# Patient Record
Sex: Female | Born: 1940 | Race: White | Hispanic: No | Smoking: Former smoker
Health system: Southern US, Community
[De-identification: ages and names within clinical notes are randomized; demographics above are authoritative.]

## PROBLEM LIST (undated history)

## (undated) DIAGNOSIS — J189 Pneumonia, unspecified organism: Secondary | ICD-10-CM

## (undated) DIAGNOSIS — I2510 Atherosclerotic heart disease of native coronary artery without angina pectoris: Secondary | ICD-10-CM

## (undated) DIAGNOSIS — M545 Low back pain, unspecified: Secondary | ICD-10-CM

## (undated) DIAGNOSIS — S069XAA Unspecified intracranial injury with loss of consciousness status unknown, initial encounter: Secondary | ICD-10-CM

## (undated) DIAGNOSIS — B9620 Unspecified Escherichia coli [E. coli] as the cause of diseases classified elsewhere: Secondary | ICD-10-CM

## (undated) DIAGNOSIS — R4189 Other symptoms and signs involving cognitive functions and awareness: Secondary | ICD-10-CM

## (undated) DIAGNOSIS — R32 Unspecified urinary incontinence: Secondary | ICD-10-CM

## (undated) DIAGNOSIS — I70209 Unspecified atherosclerosis of native arteries of extremities, unspecified extremity: Secondary | ICD-10-CM

## (undated) DIAGNOSIS — F411 Generalized anxiety disorder: Secondary | ICD-10-CM

## (undated) DIAGNOSIS — N184 Chronic kidney disease, stage 4 (severe): Secondary | ICD-10-CM

## (undated) DIAGNOSIS — F419 Anxiety disorder, unspecified: Secondary | ICD-10-CM

## (undated) DIAGNOSIS — F329 Major depressive disorder, single episode, unspecified: Secondary | ICD-10-CM

## (undated) DIAGNOSIS — G458 Other transient cerebral ischemic attacks and related syndromes: Secondary | ICD-10-CM

## (undated) DIAGNOSIS — I480 Paroxysmal atrial fibrillation: Secondary | ICD-10-CM

## (undated) DIAGNOSIS — A419 Sepsis, unspecified organism: Secondary | ICD-10-CM

## (undated) DIAGNOSIS — N12 Tubulo-interstitial nephritis, not specified as acute or chronic: Secondary | ICD-10-CM

## (undated) DIAGNOSIS — R7881 Bacteremia: Secondary | ICD-10-CM

## (undated) DIAGNOSIS — I739 Peripheral vascular disease, unspecified: Secondary | ICD-10-CM

## (undated) DIAGNOSIS — H9190 Unspecified hearing loss, unspecified ear: Secondary | ICD-10-CM

## (undated) DIAGNOSIS — I774 Celiac artery compression syndrome: Secondary | ICD-10-CM

## (undated) DIAGNOSIS — I219 Acute myocardial infarction, unspecified: Secondary | ICD-10-CM

## (undated) DIAGNOSIS — R300 Dysuria: Secondary | ICD-10-CM

## (undated) DIAGNOSIS — H409 Unspecified glaucoma: Secondary | ICD-10-CM

## (undated) DIAGNOSIS — G4733 Obstructive sleep apnea (adult) (pediatric): Secondary | ICD-10-CM

## (undated) DIAGNOSIS — G8929 Other chronic pain: Secondary | ICD-10-CM

## (undated) DIAGNOSIS — I10 Essential (primary) hypertension: Secondary | ICD-10-CM

## (undated) DIAGNOSIS — K219 Gastro-esophageal reflux disease without esophagitis: Secondary | ICD-10-CM

## (undated) DIAGNOSIS — M47816 Spondylosis without myelopathy or radiculopathy, lumbar region: Secondary | ICD-10-CM

## (undated) DIAGNOSIS — H8110 Benign paroxysmal vertigo, unspecified ear: Secondary | ICD-10-CM

## (undated) DIAGNOSIS — E039 Hypothyroidism, unspecified: Secondary | ICD-10-CM

## (undated) DIAGNOSIS — I771 Stricture of artery: Secondary | ICD-10-CM

## (undated) DIAGNOSIS — K5790 Diverticulosis of intestine, part unspecified, without perforation or abscess without bleeding: Secondary | ICD-10-CM

## (undated) DIAGNOSIS — F32A Depression, unspecified: Secondary | ICD-10-CM

## (undated) DIAGNOSIS — Z9989 Dependence on other enabling machines and devices: Secondary | ICD-10-CM

## (undated) DIAGNOSIS — G9389 Other specified disorders of brain: Secondary | ICD-10-CM

## (undated) DIAGNOSIS — L509 Urticaria, unspecified: Secondary | ICD-10-CM

## (undated) DIAGNOSIS — J3489 Other specified disorders of nose and nasal sinuses: Secondary | ICD-10-CM

## (undated) DIAGNOSIS — K635 Polyp of colon: Secondary | ICD-10-CM

## (undated) DIAGNOSIS — E782 Mixed hyperlipidemia: Secondary | ICD-10-CM

## (undated) DIAGNOSIS — I509 Heart failure, unspecified: Secondary | ICD-10-CM

## (undated) DIAGNOSIS — S069X9A Unspecified intracranial injury with loss of consciousness of unspecified duration, initial encounter: Secondary | ICD-10-CM

## (undated) HISTORY — DX: Peripheral vascular disease, unspecified: I73.9

## (undated) HISTORY — PX: NM MYOCAR PERF EJECTION FRACTION: HXRAD630

## (undated) HISTORY — DX: Other chronic pain: G89.29

## (undated) HISTORY — DX: Pneumonia, unspecified organism: J18.9

## (undated) HISTORY — DX: Atherosclerotic heart disease of native coronary artery without angina pectoris: I25.10

## (undated) HISTORY — DX: Celiac artery compression syndrome: I77.4

## (undated) HISTORY — DX: Unspecified glaucoma: H40.9

## (undated) HISTORY — PX: DOPPLER ECHOCARDIOGRAPHY: SHX263

## (undated) HISTORY — DX: Acute myocardial infarction, unspecified: I21.9

## (undated) HISTORY — PX: CORONARY ARTERY BYPASS GRAFT: SHX141

## (undated) HISTORY — DX: Gastro-esophageal reflux disease without esophagitis: K21.9

## (undated) HISTORY — DX: Obstructive sleep apnea (adult) (pediatric): G47.33

## (undated) HISTORY — DX: Unspecified intracranial injury with loss of consciousness status unknown, initial encounter: S06.9XAA

## (undated) HISTORY — DX: Obstructive sleep apnea (adult) (pediatric): Z99.89

## (undated) HISTORY — DX: Dysuria: R30.0

## (undated) HISTORY — DX: Low back pain: M54.5

## (undated) HISTORY — DX: Stricture of artery: I77.1

## (undated) HISTORY — DX: Generalized anxiety disorder: F41.1

## (undated) HISTORY — DX: Unspecified atherosclerosis of native arteries of extremities, unspecified extremity: I70.209

## (undated) HISTORY — DX: Major depressive disorder, single episode, unspecified: F32.9

## (undated) HISTORY — DX: Unspecified intracranial injury with loss of consciousness of unspecified duration, initial encounter: S06.9X9A

## (undated) HISTORY — DX: Other symptoms and signs involving cognitive functions and awareness: R41.89

## (undated) HISTORY — DX: Mixed hyperlipidemia: E78.2

## (undated) HISTORY — DX: Diverticulosis of intestine, part unspecified, without perforation or abscess without bleeding: K57.90

## (undated) HISTORY — DX: Unspecified urinary incontinence: R32

## (undated) HISTORY — DX: Sepsis, unspecified organism: A41.9

## (undated) HISTORY — DX: Tubulo-interstitial nephritis, not specified as acute or chronic: N12

## (undated) HISTORY — DX: Urticaria, unspecified: L50.9

## (undated) HISTORY — DX: Unspecified hearing loss, unspecified ear: H91.90

## (undated) HISTORY — DX: Depression, unspecified: F32.A

## (undated) HISTORY — DX: Benign paroxysmal vertigo, unspecified ear: H81.10

## (undated) HISTORY — DX: Hypothyroidism, unspecified: E03.9

## (undated) HISTORY — DX: Spondylosis without myelopathy or radiculopathy, lumbar region: M47.816

## (undated) HISTORY — DX: Low back pain, unspecified: M54.50

## (undated) HISTORY — PX: CORONARY ANGIOPLASTY: SHX604

## (undated) HISTORY — PX: JOINT REPLACEMENT: SHX530

## (undated) HISTORY — DX: Anxiety disorder, unspecified: F41.9

## (undated) HISTORY — DX: Polyp of colon: K63.5

## (undated) HISTORY — DX: Other specified disorders of brain: G93.89

## (undated) HISTORY — DX: Other transient cerebral ischemic attacks and related syndromes: G45.8

---

## 1997-08-08 HISTORY — PX: OTHER SURGICAL HISTORY: SHX169

## 1998-05-18 ENCOUNTER — Encounter: Payer: Self-pay | Admitting: Emergency Medicine

## 1998-05-18 ENCOUNTER — Inpatient Hospital Stay (HOSPITAL_COMMUNITY): Admission: EM | Admit: 1998-05-18 | Discharge: 1998-05-26 | Payer: Self-pay | Admitting: Emergency Medicine

## 1998-05-21 ENCOUNTER — Encounter: Payer: Self-pay | Admitting: Surgery

## 1998-05-22 ENCOUNTER — Encounter: Payer: Self-pay | Admitting: Surgery

## 1998-07-14 ENCOUNTER — Encounter (HOSPITAL_COMMUNITY): Admission: RE | Admit: 1998-07-14 | Discharge: 1998-10-12 | Payer: Self-pay | Admitting: Cardiovascular Disease

## 1998-10-13 ENCOUNTER — Encounter (HOSPITAL_COMMUNITY): Admission: RE | Admit: 1998-10-13 | Discharge: 1999-01-11 | Payer: Self-pay | Admitting: Cardiovascular Disease

## 1998-12-13 ENCOUNTER — Encounter: Payer: Self-pay | Admitting: Cardiovascular Disease

## 1998-12-13 ENCOUNTER — Observation Stay (HOSPITAL_COMMUNITY): Admission: EM | Admit: 1998-12-13 | Discharge: 1998-12-14 | Payer: Self-pay | Admitting: Emergency Medicine

## 1998-12-22 ENCOUNTER — Other Ambulatory Visit: Admission: RE | Admit: 1998-12-22 | Discharge: 1998-12-22 | Payer: Self-pay | Admitting: Gynecology

## 1999-05-12 ENCOUNTER — Inpatient Hospital Stay (HOSPITAL_COMMUNITY): Admission: EM | Admit: 1999-05-12 | Discharge: 1999-05-14 | Payer: Self-pay | Admitting: Emergency Medicine

## 1999-06-18 ENCOUNTER — Encounter: Payer: Self-pay | Admitting: Otolaryngology

## 1999-06-18 ENCOUNTER — Encounter: Admission: RE | Admit: 1999-06-18 | Discharge: 1999-06-18 | Payer: Self-pay | Admitting: Otolaryngology

## 2000-02-08 ENCOUNTER — Other Ambulatory Visit: Admission: RE | Admit: 2000-02-08 | Discharge: 2000-02-08 | Payer: Self-pay | Admitting: Gynecology

## 2000-12-19 ENCOUNTER — Other Ambulatory Visit: Admission: RE | Admit: 2000-12-19 | Discharge: 2000-12-19 | Payer: Self-pay | Admitting: Gynecology

## 2000-12-25 ENCOUNTER — Encounter: Payer: Self-pay | Admitting: Emergency Medicine

## 2000-12-26 ENCOUNTER — Inpatient Hospital Stay (HOSPITAL_COMMUNITY): Admission: EM | Admit: 2000-12-26 | Discharge: 2000-12-28 | Payer: Self-pay | Admitting: Emergency Medicine

## 2000-12-28 ENCOUNTER — Encounter: Payer: Self-pay | Admitting: Cardiovascular Disease

## 2000-12-28 ENCOUNTER — Encounter (INDEPENDENT_AMBULATORY_CARE_PROVIDER_SITE_OTHER): Payer: Self-pay | Admitting: *Deleted

## 2001-02-17 ENCOUNTER — Encounter: Payer: Self-pay | Admitting: Emergency Medicine

## 2001-02-17 ENCOUNTER — Observation Stay (HOSPITAL_COMMUNITY): Admission: EM | Admit: 2001-02-17 | Discharge: 2001-02-20 | Payer: Self-pay | Admitting: Emergency Medicine

## 2001-02-18 ENCOUNTER — Encounter: Payer: Self-pay | Admitting: Cardiovascular Disease

## 2001-03-12 ENCOUNTER — Other Ambulatory Visit: Admission: RE | Admit: 2001-03-12 | Discharge: 2001-03-12 | Payer: Self-pay | Admitting: Gynecology

## 2001-05-10 ENCOUNTER — Ambulatory Visit (HOSPITAL_COMMUNITY): Admission: RE | Admit: 2001-05-10 | Discharge: 2001-05-10 | Payer: Self-pay | Admitting: Gynecology

## 2001-05-10 ENCOUNTER — Encounter (INDEPENDENT_AMBULATORY_CARE_PROVIDER_SITE_OTHER): Payer: Self-pay

## 2002-03-12 ENCOUNTER — Other Ambulatory Visit: Admission: RE | Admit: 2002-03-12 | Discharge: 2002-03-12 | Payer: Self-pay | Admitting: Gynecology

## 2002-03-26 ENCOUNTER — Encounter (INDEPENDENT_AMBULATORY_CARE_PROVIDER_SITE_OTHER): Payer: Self-pay | Admitting: Gastroenterology

## 2002-03-26 ENCOUNTER — Encounter: Payer: Self-pay | Admitting: Internal Medicine

## 2002-04-12 ENCOUNTER — Encounter: Payer: Self-pay | Admitting: Gastroenterology

## 2002-04-12 ENCOUNTER — Ambulatory Visit (HOSPITAL_COMMUNITY): Admission: RE | Admit: 2002-04-12 | Discharge: 2002-04-12 | Payer: Self-pay | Admitting: Gastroenterology

## 2002-04-12 ENCOUNTER — Encounter (INDEPENDENT_AMBULATORY_CARE_PROVIDER_SITE_OTHER): Payer: Self-pay | Admitting: *Deleted

## 2002-06-19 ENCOUNTER — Other Ambulatory Visit: Admission: RE | Admit: 2002-06-19 | Discharge: 2002-06-19 | Payer: Self-pay | Admitting: Gynecology

## 2002-12-31 ENCOUNTER — Other Ambulatory Visit: Admission: RE | Admit: 2002-12-31 | Discharge: 2002-12-31 | Payer: Self-pay | Admitting: Gynecology

## 2003-10-17 ENCOUNTER — Other Ambulatory Visit: Admission: RE | Admit: 2003-10-17 | Discharge: 2003-10-17 | Payer: Self-pay | Admitting: Gynecology

## 2004-11-14 ENCOUNTER — Emergency Department (HOSPITAL_COMMUNITY): Admission: EM | Admit: 2004-11-14 | Discharge: 2004-11-14 | Payer: Self-pay | Admitting: Emergency Medicine

## 2005-10-04 ENCOUNTER — Emergency Department (HOSPITAL_COMMUNITY): Admission: EM | Admit: 2005-10-04 | Discharge: 2005-10-04 | Payer: Self-pay | Admitting: *Deleted

## 2007-01-25 ENCOUNTER — Encounter: Admission: RE | Admit: 2007-01-25 | Discharge: 2007-01-25 | Payer: Self-pay | Admitting: Internal Medicine

## 2007-01-25 ENCOUNTER — Encounter (INDEPENDENT_AMBULATORY_CARE_PROVIDER_SITE_OTHER): Payer: Self-pay | Admitting: *Deleted

## 2007-07-04 ENCOUNTER — Other Ambulatory Visit: Admission: RE | Admit: 2007-07-04 | Discharge: 2007-07-04 | Payer: Self-pay | Admitting: Gynecology

## 2008-01-21 HISTORY — PX: OTHER SURGICAL HISTORY: SHX169

## 2008-10-24 ENCOUNTER — Telehealth: Payer: Self-pay | Admitting: Internal Medicine

## 2008-11-17 DIAGNOSIS — I219 Acute myocardial infarction, unspecified: Secondary | ICD-10-CM

## 2008-11-17 DIAGNOSIS — E039 Hypothyroidism, unspecified: Secondary | ICD-10-CM | POA: Insufficient documentation

## 2008-11-17 DIAGNOSIS — K649 Unspecified hemorrhoids: Secondary | ICD-10-CM | POA: Insufficient documentation

## 2008-11-17 DIAGNOSIS — I25708 Atherosclerosis of coronary artery bypass graft(s), unspecified, with other forms of angina pectoris: Secondary | ICD-10-CM | POA: Insufficient documentation

## 2008-11-17 DIAGNOSIS — K219 Gastro-esophageal reflux disease without esophagitis: Secondary | ICD-10-CM | POA: Insufficient documentation

## 2008-11-17 DIAGNOSIS — F341 Dysthymic disorder: Secondary | ICD-10-CM | POA: Insufficient documentation

## 2008-11-17 DIAGNOSIS — K589 Irritable bowel syndrome without diarrhea: Secondary | ICD-10-CM | POA: Insufficient documentation

## 2008-11-17 DIAGNOSIS — I2510 Atherosclerotic heart disease of native coronary artery without angina pectoris: Secondary | ICD-10-CM | POA: Insufficient documentation

## 2008-11-17 DIAGNOSIS — I10 Essential (primary) hypertension: Secondary | ICD-10-CM | POA: Insufficient documentation

## 2008-11-17 HISTORY — DX: Acute myocardial infarction, unspecified: I21.9

## 2008-11-18 ENCOUNTER — Ambulatory Visit: Payer: Self-pay | Admitting: Internal Medicine

## 2008-12-09 ENCOUNTER — Encounter: Payer: Self-pay | Admitting: Internal Medicine

## 2008-12-09 ENCOUNTER — Ambulatory Visit: Payer: Self-pay | Admitting: Internal Medicine

## 2008-12-09 DIAGNOSIS — K2970 Gastritis, unspecified, without bleeding: Secondary | ICD-10-CM | POA: Insufficient documentation

## 2008-12-09 DIAGNOSIS — K2990 Gastroduodenitis, unspecified, without bleeding: Secondary | ICD-10-CM

## 2008-12-09 LAB — CONVERTED CEMR LAB: UREASE: NEGATIVE

## 2008-12-12 ENCOUNTER — Encounter: Payer: Self-pay | Admitting: Internal Medicine

## 2010-06-15 ENCOUNTER — Encounter: Payer: Self-pay | Admitting: Internal Medicine

## 2010-06-18 ENCOUNTER — Ambulatory Visit: Payer: Self-pay | Admitting: Internal Medicine

## 2010-06-18 DIAGNOSIS — R1031 Right lower quadrant pain: Secondary | ICD-10-CM | POA: Insufficient documentation

## 2010-06-18 DIAGNOSIS — Z860100 Personal history of colon polyps, unspecified: Secondary | ICD-10-CM | POA: Insufficient documentation

## 2010-09-09 NOTE — Assessment & Plan Note (Signed)
Summary: Chronic right lower quadrant pain (consult from Dr. Philip Aspen)   History of Present Illness Visit Type: Follow-up Visit Primary GI MD: Scarlette Shorts MD Primary Provider: Leanna Battles, MD Requesting Provider: Leanna Battles M.D. Chief Complaint: Rt. sided pain , off/on x 3 -6 months History of Present Illness:   70 year old female with coronary artery disease status post coronary artery bypass grafting, hypertension, hyperlipidemia, peripheral vascular disease status post bifemoral bypass, GERD, and adenomatous colon polyps. She presents today for evaluation of chronic right lower quadrant pain. She reports a 3-6 month history of intermittent right lower quadrant pain. She describes it poorly. Says that it seems like a soreness or ache. The discomfort is possibly exacerbated by defecation. The discomfort resolves with time. She cannot define the frequency or duration of pain. It is certainly not daily. It does not inhibit her activities or sleep. She is concerned that it may represent cancer. She has had 10 pound weight gain over the past year. Her last colonoscopy was in May of 2010. 3 small adenomas removed as well as moderate diverticulosis. Followup in 3 years recommended. Upper endoscopy revealed H. pylori negative gastritis but was otherwise normal. She continues on Nexium for GERD. No active GERD symptoms. Her bowels occasionally alternate between constipation and diarrhea. This is not new. No bleeding. Review of extensive records from the outside shows that the patient had a CT scan of the abdomen and pelvis with and without contrast May 18, 2010. No acute abnormalities. As well, negative pelvic ultrasound and negative abdominal ultrasound (other than hepatic steatosis) in August 2011. Laboratories that same month including CBC and comprehensive metabolic panel and urinalysis were normal. She states that the discomfort is present currently.   GI Review of Systems    Reports abdominal  pain, bloating, and  nausea.     Location of  Abdominal pain: right side.    Denies acid reflux, belching, chest pain, dysphagia with liquids, dysphagia with solids, heartburn, loss of appetite, vomiting, vomiting blood, weight loss, and  weight gain.      Reports black tarry stools, constipation, and  diarrhea.     Denies anal fissure, change in bowel habit, diverticulosis, fecal incontinence, heme positive stool, hemorrhoids, irritable bowel syndrome, jaundice, light color stool, liver problems, rectal bleeding, and  rectal pain.    Current Medications (verified): 1)  Metoprolol Tartrate 50 Mg Tabs (Metoprolol Tartrate) .... Two Times A Day 2)  Synthroid 175 Mcg Tabs (Levothyroxine Sodium) .... Once Daily 3)  Norvasc 10 Mg Tabs (Amlodipine Besylate) .... Every Morning 4)  Diovan 160 Mg Tabs (Valsartan) .... Once Daily 5)  Aspirin 81 Mg Tbec (Aspirin) .... Once Daily 6)  Crestor 10 Mg Tabs (Rosuvastatin Calcium) .... Take 2 Tabs Nightly 7)  Zoloft 100 Mg Tabs (Sertraline Hcl) .... Take 1 Tablet By Mouth Two Times A Day 8)  Nexium 40 Mg Cpdr (Esomeprazole Magnesium) .... Take Every Evening After Dinner 9)  Meclizine Hcl 25 Mg Tabs (Meclizine Hcl) .Marland Kitchen.. 1 By Mouth Three Times A Day For Dizziness  Allergies: No Known Drug Allergies  Past History:  Past Medical History: Reviewed history from 11/17/2008 and no changes required. Current Problems:  AMI (ICD-410.90) CAD (ICD-414.00) HYPERTENSION (ICD-401.9) HYPOTHYROIDISM (ICD-244.9) GERD (ICD-530.81) ANXIETY DEPRESSION (ICD-300.4) IBS (ICD-564.1) OBESITY, MODERATE (ICD-278.00) HEMORRHOIDS (ICD-455.6)  Past Surgical History: Fem-Fem Bypass CABG  Family History: Reviewed history from 11/18/2008 and no changes required. Family History of Colon Cancer:Brother Family History of Heart DiseaseSiblings  Social History: Reviewed history from 11/18/2008  and no changes required. Occupation: Retired Medical sales representative work Patient is a former  smoker.  Alcohol Use - yes- occ. Illicit Drug Use - no Divorced with one daughter  Review of Systems       The patient complains of arthritis/joint pain, heart rhythm changes, and urination - excessive.  The patient denies allergy/sinus, anemia, anxiety-new, back pain, blood in urine, breast changes/lumps, change in vision, confusion, cough, coughing up blood, depression-new, fainting, fatigue, fever, headaches-new, hearing problems, heart murmur, itching, menstrual pain, muscle pains/cramps, night sweats, nosebleeds, pregnancy symptoms, shortness of breath, skin rash, sleeping problems, sore throat, swelling of feet/legs, swollen lymph glands, thirst - excessive , urination - excessive , urination changes/pain, urine leakage, vision changes, and voice change.    Vital Signs:  Patient profile:   70 year old female Height:      62 inches Weight:      208 pounds BMI:     38.18 Pulse rate:   76 / minute Pulse rhythm:   regular BP sitting:   98 / 62  (left arm) Cuff size:   large  Vitals Entered By: June McMurray CMA Deborra Medina) (June 18, 2010 2:08 PM)  Physical Exam  General:  Well developed, obese,well nourished, no acute distress. Head:  Normocephalic and atraumatic. Eyes:  PERRLA, no icterus. Mouth:  No deformity or lesions. Neck:  Supple; no masses or thyromegaly. Lungs:  Clear throughout to auscultation. Heart:  Regular rate and rhythm; no murmurs, rubs,  or bruits. Abdomen:  obese and soft without mass. No organomegaly or hernia. Normal bowel sounds. Some tenderness with deep palpation in the right lower quadrant. Msk:  Symmetrical with no gross deformities. Normal posture. Pulses:  Normal pulses noted. Extremities:  no edema Neurologic:  alert and oriented Skin:  no jaundice Psych:  Alert and cooperative. Normal mood and affect.   Impression & Recommendations:  Problem # 1:  ABDOMINAL PAIN-RLQ (ICD-789.03) chronic right lower quadrant discomfort as described. Negative  extensive workup. The discomfort is not consistent with a primary gastrointestinal problem. Most likely musculoskeletal or adhesions. Not affecting her quality of life or accompanied by alarm features.  Plan: #1. Reassurance #2. No further workup plan #3. Return your primary provider  Problem # 2:  GERD (ICD-530.81) GERD. Asymptomatic on PPI therapy.  Plan: #1. Reflux precautions with attention to weight loss #2. continue PPI #3. Followup p.r.n.  Problem # 3:  PERSONAL HX COLONIC POLYPS (ICD-V12.72) personal history of multiple adenomatous colon polyps and a family history of colon cancer. Last colonoscopy May 2010. Due for routine surveillance around May 2013.  Patient Instructions: 1)  Copy sent to : Leanna Battles, MD 2)  Please schedule a follow-up appointment as needed.  3)  The medication list was reviewed and reconciled.  All changed / newly prescribed medications were explained.  A complete medication list was provided to the patient / caregiver.

## 2010-09-09 NOTE — Miscellaneous (Signed)
Summary: Medication Add-on  Clinical Lists Changes  Medications: Added new medication of MECLIZINE HCL 25 MG TABS (MECLIZINE HCL) 1 by mouth three times a day for dizziness Changed medication from ZOLOFT 100 MG TABS (SERTRALINE HCL) Take 1 tab dily and 1/2 at night to ZOLOFT 100 MG TABS (SERTRALINE HCL) take 1 tablet by mouth two times a day

## 2010-12-24 NOTE — Discharge Summary (Signed)
[facility]  Patient:    Deanna [name], Deanna [name]                      MRN: [id number] Adm. Date:  [id number] Disc. Date: [id number] Attending:  Donnajean Lopes CC:         Richard A. [name], M.D.   Discharge Summary  HISTORY OF PRESENT ILLNESS:  The patient is a middle-aged woman who developed nausea associated with fever resulting in emergency room evaluation.  She complained of mild epigastric to right upper quadrant discomfort.  She denied substernal chest pain, productive cough, diarrhea, or dysuria.  PHYSICAL EXAMINATION:  Initial physical examination was pertinent for a temperature of 101 degrees Fahrenheit with normal blood pressure and pulse. In general, she is a mildly overweight white female who had a flushed appearance and was alert and oriented x 3.  HEENT: Within normal limits. NECK: Supple with full range of motion.  No jugular venous distension or carotid bruit.  CHEST: Clear to auscultation.  HEART: Regular rate and rhythm. S1 and S2 without murmur, gallop, or rub.  ABDOMEN: Very slight right upper quadrant tenderness without rebound.  There was no hepatosplenomegaly. EXTREMITIES: Without clubbing, cyanosis, or edema.  NEUROLOGICAL: Nonfocal.  LABORATORY DATA:  White blood cell count 9.5, hemoglobin 13, hematocrit 38, platelet count 203.  Serum sodium 138, potassium 3.8, chloride 103, CO2 26, BUN 10, creatinine 0.9, glucose 127.  CPK levels were 62 and 67.  Troponin I level was 0.17 and 0.05.  A urine culture and two sets of blood cultures grew Escherichia coli at greater than 100,000 colonies were ml with similar sensitivities.  Liver associated enzymes were normal.  A Dec 27, 2000, right upper quadrant ultrasound showed an echogenic liver with a normal gallbladder not containing gallstones and showing no evidence of biliary duct dilatation.  HOSPITAL COURSE:  The patient was admitted to a medical bed without telemetry. Serial  cardiac isoenzymes were within normal limits.  She was seen by a cardiology consultant who felt that her discomfort was not due to an angina variant.  A right upper quadrant ultrasound was obtained which showed no evidence of hepatobiliary etiology of her fever.  She was treated with Tequin antibiotic which was associated with rapid defervescence.  By the day of discharge, she was eating well and no longer had any nausea, shortness of breath, or chest discomfort.  DISCHARGE DIAGNOSES: 1. Urosepsis from pyelonephritis, Escherichia coli bacteremia. 2. Coronary artery disease. 3. Hyperlipidemia. 4. Anxiety. 5. Depression. 6. Hypertension. 7. Hypothyroidism.  DISCHARGE MEDICATIONS:  1. Lopressor 50 mg p.o. b.i.d.  2. Zoloft 100 mg p.o. q.a.m.  3. Estrogen one tablet daily.  4. Aspirin 81 mg daily.  5. Zocor 40 mg p.o. q.d.  6. Ativan 1 mg p.o. b.i.d. p.r.n. anxiety #60 refill 2.  7. Prometrium 100 mg p.o. q.d.  8. Diovan 160 mg p.o. q.d.  9. Zyrtec 10 mg p.o. q.d. 10. Ditropan one tablet p.o. q.d. p.r.n. 11. Synthroid 125 mcg p.o. q.d. 12. Meclozine 25 mg p.o. t.i.d. p.r.n. dizziness. 13. Tequin 400 mg one tablet p.o. q.d. #11.  PROCEDURE:  Right upper quadrant ultrasound.  COMPLICATIONS:  None.  CONDITION ON DISCHARGE:  She is at her baseline without any nausea, fever, shortness of breath, or chest discomfort.  FOLLOW-UP:  She should be seen in Dr. Greta Doom office for a follow-up evaluation in two to three weeks following discharge.  If her symptoms worsen in the interim, she  should call Dr. Philip Aspen or Dr. Young Berry office for further recommendations. DD:  01/04/01 TD:  01/04/01 Job: [id number]

## 2010-12-24 NOTE — H&P (Signed)
[facility]  Patient:    Deanna [name], Deanna [name] Visit Number: [id number] MRN: [id number]          Service Type: Attending:  Belinda Block. Fontaine, M.D. Dictated by:   Belinda Block. Fontaine, M.D. Adm. Date:  05/10/01                           History and Physical  SCHEDULED ADMISSION DATE:     May 10, 2001.  CHIEF COMPLAINT:              Dysplasia.  HISTORY OF PRESENT ILLNESS:   A 70 year old female on hormone replacement therapy to include Estrace 1 mg q.d. and Prometrium 100 mg q.d. The patient presented for her routine annual physical and was found to have a Pap smear showing atypical squamous cells of undetermined significance although suspicious for low grade SIL. The patient was followed up with colposcopy appointment and on colposcopy appointment, she was found to have an inadequate colposcopy with no transformation zone seen with acetowhite changes that involved the entire 360 degrees surrounding the cervical os. The options for management were reviewed with the patient and after a lengthy discussion, we both agreed on proceeding with a LEEP excision process, both for diagnoses as well as potential therapy. The patient does have a significant cardiac history and she is admitted to the hospital for this procedure to be done under monitored anesthesia versus an outpatient setting.  PAST MEDICAL HISTORY:         Significant for cardiovascular disease to include an angioplasty, a CABG procedure, and a fem-fem bypass. She is also being followed for hypertension, hypercholesterolemia, and gastrointestinal reflux disease.  PAST SURGICAL HISTORY:        Angioplasty, fem-fem bypass and her cardiac vascular bypass.  CURRENT MEDICATIONS:          Norvasc, Metoprolol, Diovan, Ditropan, Prevacid, Estrace, Prometrium, baby aspirin, TriCor and Zocor.  CURRENT ALLERGIES:            None.  REVIEW OF SYSTEMS:            Noncontributory.  SOCIAL HISTORY:                Noncontributory.  ADMISSION PHYSICAL EXAMINATION:  HEENT:                        Normal.  LUNGS:                        Clear.  CARDIAC:                      Regular rate without gross rubs, murmurs, or gallops.  ABDOMEN:                      Benign.  PELVIC:                       External BUS, vagina normal. Cervix normal, grossly as described per colposcopy before. Uterus grossly normal size. Adnexa without masses or tenderness.  ASSESSMENT:                   A 70 year old postmenopausal female on hormone replacement therapy with first abnormal Pap smear of atypical squamous cells of undetermined significance suggestive of a higher lesion. Colposcopic evaluation showed a very stenotic cervical os with acetowhite epithelium ringing around  the os and there was no transformation zone seen.  PLAN:                         The various options for management were reviewed with the patient to include expectant management, attempted outpatient ECC biopsy for diagnoses versus LEEP procedure for both excisional diagnoses as well as potential therapy were all reviewed with her and we both agree to proceed with a LEEP biopsy under anesthesia monitoring. I discussed the risks of the procedure to include her cardiovascular status as well as the immediate risks of the surgery to include the risks of bleeding requiring retreatment as well as the risks of infection. I reviewed the risks of damage to surrounding tissues such as vagina, bladder, rectum requiring major reparative surgeries and potentially future reparative surgeries, all of which were understood and accepted. The patients questions were answered to her satisfaction and she is ready to proceed with surgery. Dictated by:   Belinda Block. Fontaine, M.D. Attending:  Belinda Block. Fontaine, M.D. DD:  05/07/01 TD:  05/07/01 Job: [id number]

## 2010-12-24 NOTE — Cardiovascular Report (Signed)
[place]  Patient:    Deanna [name], Deanna [name]                      MRN: [id number] Proc. Date: 02/19/01 Adm. Date:  [id number] Attending:  Berry, Jonathan Martinique CC:         Cardiac Catheterization Lab  [facility], [address], [place], [place] [address]   Cardiac Catheterization  HISTORY:  Deanna [name] is a 70 year old single white female, with a history of multiple PCIs and ultimately CABG in October 1999 -- LIMA to the LAD, vein graft to second diagonal branch, OM and a RCA.  She has PVOD, status post left and right fem-fem overcross grafting because of an occluded right common iliac artery, secondary to procedure-dilated dissection.  She was last admitted Dec 07, 2000 with atypical chest pain and ruled out for MI.  The patient was admitted on February 17, 2001 with chest pain and shortness of breath.  She ruled out for myocardial infarction.  There were no acute EKG changes.  She was placed on IV nitroglycerin and heparin.  Presents now for diagnostic coronary arteriography.   PROCEDURE DESCRIPTION:  The patient is brought to the second floor [facility] The cardiac catheterization lab in the postabsorptive state.  She was premedicated with p.o. Valium and IV Versed.  Her left groin was prepped and shaved in the usual sterile fashion.  Xylocaine 1% was used for local anesthesia.  A 6-French sheath was inserted into the left femoral artery using standard Seldinger technique.  Six-French right and left Judkins diagnostic catheters, as well as a 6-French pigtail catheter and right vein bypass graft catheter were used for selective coronary angiography, left ventriculography, selective vein graft angiography, selective IMA angiography, and distal abdominal aortography.  Omnipaque dye was used through the entirety of the case.  Retrograde aortic, left ventricular and pullback pressures were recorded.  HEMODYNAMICS: 1.  Aortic pressure:  178/84. 2. Left ventricular pressure:  179/22.  SELECTIVE CORONARY ANGIOGRAPHY: 1. LEFT MAIN:  Normal. 2. LEFT ANTERIOR DESCENDING ARTERY:  Had a 50% fairly focal proximal stenosis,    followed by a long 70% segmental mid stenosis.  There was a 90% fairly    focal stenosis just before the takeoff of a second medium-sized    diagonal branch, which exhibited competitive flow from an intact vein    graft.  There was retrograde flow up the atretic LIMA graft. 3. LEFT CIRCUMFLEX:  This vessel revealed an 80% mid, followed by 95% distal    stenosis. 4. RIGHT CORONARY ARTERY:  This vessel revealed a 70% segmental proximal    stenosis. 5. GRAFTS:    a. Vein graft to the right coronary artery is widely patent.    b. Vein graft to the circumflex marginal is widely patent.    c. Vein graft to the diagonal branch #2 is widely patent, with filling       both from the diagonal and the LAD retrograde.    d. LIMA to the LAD atretic.  LEFT VENTRICULOGRAPHY:  RAO left ventriculogram was performed using 25 cc of Omnipaque dye at 12 cc/sec.  The overall LVEF was estimated at greater than 60%, without focal wall motion abnormalities.  DISTAL ABDOMINAL AORTOGRAPHY:  Performed using 20 cc of Omnipaque dye a 20 cc/sec x 2.  The renal arteries are widely patent.  The infrarenal abdominal aorta was free of atherosclerotic changes.  The right common  iliac artery was occluded, and there was an intact left to right fem-fem crossover graft.  IMPRESSION:  Deanna [name] has patent grafts, except for an atretic LIMA.  The distal LAD fills retrograde from the diagonal vein graft.  I am unsure whether or not her "culprit territory" involves her anteroapical segment, because of high-grade LAD disease and the atretic LIMA versus adequate blood flow via the diagonal vein graft.  I have reviewed the cineangiograms with Dr. Maylon Cos ______, who agrees with performing dipyridamole Cardiolite stress testing,  prior to making a decision with regard to medical therapy versus PCI of the mid LAD.  DISPOSITION:  The sheaths are removed and pressure is held to the groin to achieve hemostasis.  The patient left the Lab in stable condition. DD:  02/19/01 TD:  02/19/01 Job: [id number]

## 2010-12-24 NOTE — Op Note (Signed)
[facility]  Patient:    Deanna [name], Deanna [name] Visit Number: [id number] MRN: [id number]          Service Type: DSU Location: [facility] Attending Physician:  Tereso Newcomer Dictated by:   Belinda Block Fontaine, M.D. Proc. Date: 05/10/01 Admit Date:  05/10/2001                             Operative Report  PREOPERATIVE DIAGNOSIS:       Abnormal Pap smear.  POSTOPERATIVE DIAGNOSIS:      Abnormal Pap smear.  PROCEDURE:                    Loop electrosurgical excision procedure                               with endocervical curettage.  SURGEON:                      Timothy P. Fontaine, M.D.  ANESTHESIA:                   MAC with local cervical injection,                               4 cc 2% lidocaine with epinephrine mixture.  ESTIMATED BLOOD LOSS:         Minimal.  COMPLICATIONS:                None.  SPECIMEN:                     (1) LEEP. (2) ECC.  FINDINGS:                     Cervix stenotic, no overt gross abnormalities seen. Lugols staining without gross transformation zone seen. EUA without gross masses.  DESCRIPTION OF PROCEDURE:     The patient was taken to the operating room and underwent IV sedation and was placed in low dorsal lithotomy position. She was draped in the usual fashion. The cervix was visualized with a coated LEEP speculum. The upper vagina surface was cleansed with acetic acid and subsequently stained with Lugols solution. The cervix was circumferentially injected using 2% lidocaine with epinephrine mixture, a total of 4 cc used. Using the 15 x 12 mm loop, the LEEP specimen was then excised in one pass without difficulty with a suture tag placed at 12 oclock and sent to pathology in saline solution. An ECC was performed noting abundant cervical mucus extruded, and this was all sent to pathology. Electric ball coagulation was then delivered to the LEEP base and Monsels solution was prophylactically applied. There was  no bleeding at the end of the procedure. The patient was placed in the supine position after removal of the LEEP speculum, awakened without difficulty, and taken to the recovery room in good condition having tolerated the procedure well. Dictated by:   Belinda Block. Fontaine, M.D. Attending Physician:  Tereso Newcomer DD:  05/10/01 TD:  05/10/01 Job: [id number]

## 2011-03-04 ENCOUNTER — Emergency Department (HOSPITAL_COMMUNITY)
Admission: EM | Admit: 2011-03-04 | Discharge: 2011-03-05 | Disposition: A | Discharge status: Home / Self Care | Payer: Medicare Other | Attending: Emergency Medicine | Admitting: Emergency Medicine

## 2011-03-04 ENCOUNTER — Emergency Department (HOSPITAL_COMMUNITY): Payer: Medicare Other

## 2011-03-04 DIAGNOSIS — E039 Hypothyroidism, unspecified: Secondary | ICD-10-CM | POA: Insufficient documentation

## 2011-03-04 DIAGNOSIS — R51 Headache: Secondary | ICD-10-CM | POA: Insufficient documentation

## 2011-03-04 DIAGNOSIS — Z87820 Personal history of traumatic brain injury: Secondary | ICD-10-CM | POA: Insufficient documentation

## 2011-03-04 DIAGNOSIS — R42 Dizziness and giddiness: Secondary | ICD-10-CM | POA: Insufficient documentation

## 2011-03-04 DIAGNOSIS — Y9229 Other specified public building as the place of occurrence of the external cause: Secondary | ICD-10-CM | POA: Insufficient documentation

## 2011-03-04 DIAGNOSIS — H1130 Conjunctival hemorrhage, unspecified eye: Secondary | ICD-10-CM | POA: Insufficient documentation

## 2011-03-04 DIAGNOSIS — S0990XA Unspecified injury of head, initial encounter: Secondary | ICD-10-CM | POA: Insufficient documentation

## 2011-03-04 DIAGNOSIS — IMO0002 Reserved for concepts with insufficient information to code with codable children: Secondary | ICD-10-CM | POA: Insufficient documentation

## 2011-03-04 DIAGNOSIS — Z79899 Other long term (current) drug therapy: Secondary | ICD-10-CM | POA: Insufficient documentation

## 2011-03-04 DIAGNOSIS — K219 Gastro-esophageal reflux disease without esophagitis: Secondary | ICD-10-CM | POA: Insufficient documentation

## 2011-03-04 DIAGNOSIS — I2510 Atherosclerotic heart disease of native coronary artery without angina pectoris: Secondary | ICD-10-CM | POA: Insufficient documentation

## 2011-03-04 DIAGNOSIS — W010XXA Fall on same level from slipping, tripping and stumbling without subsequent striking against object, initial encounter: Secondary | ICD-10-CM | POA: Insufficient documentation

## 2011-03-04 DIAGNOSIS — Z951 Presence of aortocoronary bypass graft: Secondary | ICD-10-CM | POA: Insufficient documentation

## 2011-03-04 DIAGNOSIS — I10 Essential (primary) hypertension: Secondary | ICD-10-CM | POA: Insufficient documentation

## 2011-03-04 DIAGNOSIS — I252 Old myocardial infarction: Secondary | ICD-10-CM | POA: Insufficient documentation

## 2011-03-04 LAB — URINALYSIS, ROUTINE W REFLEX MICROSCOPIC
Glucose, UA: NEGATIVE mg/dL
Leukocytes, UA: NEGATIVE
Protein, ur: NEGATIVE mg/dL
Urobilinogen, UA: 0.2 mg/dL (ref 0.0–1.0)
pH: 6 (ref 5.0–8.0)

## 2011-03-04 LAB — POCT I-STAT, CHEM 8
HCT: 42 % (ref 36.0–46.0)
Hemoglobin: 14.3 g/dL (ref 12.0–15.0)
TCO2: 25 mmol/L (ref 0–100)

## 2011-03-04 LAB — DIFFERENTIAL
Basophils Relative: 0 % (ref 0–1)
Eosinophils Relative: 0 % (ref 0–5)
Lymphocytes Relative: 25 % (ref 12–46)
Lymphs Abs: 2.7 10*3/uL (ref 0.7–4.0)
Neutrophils Relative %: 70 % (ref 43–77)

## 2011-03-04 LAB — CBC
Hemoglobin: 13.9 g/dL (ref 12.0–15.0)
MCHC: 35.6 g/dL (ref 30.0–36.0)
Platelets: 145 10*3/uL — ABNORMAL LOW (ref 150–400)
RDW: 13.3 % (ref 11.5–15.5)

## 2011-04-26 ENCOUNTER — Ambulatory Visit (INDEPENDENT_AMBULATORY_CARE_PROVIDER_SITE_OTHER): Payer: Medicare Other | Admitting: *Deleted

## 2011-04-26 DIAGNOSIS — M79609 Pain in unspecified limb: Secondary | ICD-10-CM

## 2011-04-28 NOTE — Procedures (Unsigned)
DUPLEX DEEP VENOUS EXAM - LOWER EXTREMITY  INDICATION:  Left foot pain and swelling.  HISTORY:  Edema:  X4 days. Trauma/Surgery:  Vein harvesting for CABG in 1993. Pain:  No. PE:  No. Previous DVT:  No. Anticoagulants:  No. Other:  DUPLEX EXAM:               CFV   SFV   PopV  PTV    GSV               R  L  R  L  R  L  R   L  R  L Thrombosis    o  o     o     o      o     x Spontaneous   +  +     +     +            x Phasic        +  +     +     +            x Augmentation  +  +     +     +      +     x Compressible  +  +     +     +      +     x Competent     +  +     +     +      +     X  Legend:  + - yes  o - no  p - partial  D - decreased  IMPRESSION: 1. No evidence of deep venous thrombosis or superficial venous     thrombus in the left lower extremity. 2. Large Baker's cyst observed in the left popliteal fossa measuring     approximately 5.7 cm X 2.65 cm. 3. The left greater saphenous vein has been surgically harvested.   _____________________________ Jessy Oto. Fields, MD  LT/MEDQ  D:  04/26/2011  T:  04/26/2011  Job:  [id number]

## 2011-05-09 HISTORY — PX: REPLACEMENT TOTAL KNEE: SUR1224

## 2011-06-02 ENCOUNTER — Other Ambulatory Visit: Payer: Self-pay | Admitting: Cardiovascular Disease

## 2011-06-02 ENCOUNTER — Other Ambulatory Visit: Payer: Self-pay | Admitting: Orthopedic Surgery

## 2011-06-02 ENCOUNTER — Ambulatory Visit (HOSPITAL_COMMUNITY)
Admission: RE | Admit: 2011-06-02 | Discharge: 2011-06-02 | Disposition: A | Discharge status: Home / Self Care | Payer: Medicare Other | Source: Ambulatory Visit | Attending: Orthopedic Surgery | Admitting: Orthopedic Surgery

## 2011-06-02 ENCOUNTER — Encounter (HOSPITAL_COMMUNITY): Payer: Medicare Other

## 2011-06-02 ENCOUNTER — Other Ambulatory Visit (HOSPITAL_COMMUNITY): Payer: Self-pay | Admitting: Orthopedic Surgery

## 2011-06-02 DIAGNOSIS — Z01818 Encounter for other preprocedural examination: Secondary | ICD-10-CM

## 2011-06-02 LAB — URINALYSIS, ROUTINE W REFLEX MICROSCOPIC
Glucose, UA: NEGATIVE mg/dL
Nitrite: NEGATIVE
Specific Gravity, Urine: 1.014 (ref 1.005–1.030)
pH: 6 (ref 5.0–8.0)

## 2011-06-02 LAB — COMPREHENSIVE METABOLIC PANEL
ALT: 20 U/L (ref 0–35)
AST: 23 U/L (ref 0–37)
Albumin: 4.2 g/dL (ref 3.5–5.2)
Alkaline Phosphatase: 104 U/L (ref 39–117)
BUN: 14 mg/dL (ref 6–23)
CO2: 29 mEq/L (ref 19–32)
Calcium: 9.6 mg/dL (ref 8.4–10.5)
Glucose, Bld: 101 mg/dL — ABNORMAL HIGH (ref 70–99)
Total Bilirubin: 0.8 mg/dL (ref 0.3–1.2)
Total Protein: 7.1 g/dL (ref 6.0–8.3)

## 2011-06-02 LAB — CBC
Hemoglobin: 13.9 g/dL (ref 12.0–15.0)
MCH: 28.3 pg (ref 26.0–34.0)
RBC: 4.91 MIL/uL (ref 3.87–5.11)
WBC: 7.6 10*3/uL (ref 4.0–10.5)

## 2011-06-02 LAB — PROTIME-INR: INR: 1.03 (ref 0.00–1.49)

## 2011-06-02 LAB — SURGICAL PCR SCREEN: Staphylococcus aureus: NEGATIVE

## 2011-06-06 ENCOUNTER — Inpatient Hospital Stay (HOSPITAL_COMMUNITY)
Admission: RE | Admit: 2011-06-06 | Discharge: 2011-06-10 | DRG: 470 | Disposition: A | Discharge status: Skilled Nursing Facility | Payer: Medicare Other | Source: Ambulatory Visit | Attending: Orthopedic Surgery | Admitting: Orthopedic Surgery

## 2011-06-06 DIAGNOSIS — D62 Acute posthemorrhagic anemia: Secondary | ICD-10-CM | POA: Diagnosis not present

## 2011-06-06 DIAGNOSIS — I10 Essential (primary) hypertension: Secondary | ICD-10-CM | POA: Diagnosis present

## 2011-06-06 DIAGNOSIS — I2510 Atherosclerotic heart disease of native coronary artery without angina pectoris: Secondary | ICD-10-CM | POA: Diagnosis present

## 2011-06-06 DIAGNOSIS — I498 Other specified cardiac arrhythmias: Secondary | ICD-10-CM | POA: Diagnosis not present

## 2011-06-06 DIAGNOSIS — Z01812 Encounter for preprocedural laboratory examination: Secondary | ICD-10-CM

## 2011-06-06 DIAGNOSIS — M1710 Unilateral primary osteoarthritis, unspecified knee: Principal | ICD-10-CM | POA: Diagnosis present

## 2011-06-06 DIAGNOSIS — F329 Major depressive disorder, single episode, unspecified: Secondary | ICD-10-CM | POA: Diagnosis present

## 2011-06-06 DIAGNOSIS — Z951 Presence of aortocoronary bypass graft: Secondary | ICD-10-CM

## 2011-06-06 DIAGNOSIS — E876 Hypokalemia: Secondary | ICD-10-CM | POA: Diagnosis not present

## 2011-06-06 DIAGNOSIS — F3289 Other specified depressive episodes: Secondary | ICD-10-CM | POA: Diagnosis present

## 2011-06-06 DIAGNOSIS — F411 Generalized anxiety disorder: Secondary | ICD-10-CM | POA: Diagnosis present

## 2011-06-06 DIAGNOSIS — E871 Hypo-osmolality and hyponatremia: Secondary | ICD-10-CM | POA: Diagnosis not present

## 2011-06-06 DIAGNOSIS — I252 Old myocardial infarction: Secondary | ICD-10-CM

## 2011-06-06 DIAGNOSIS — E039 Hypothyroidism, unspecified: Secondary | ICD-10-CM | POA: Diagnosis present

## 2011-06-06 DIAGNOSIS — J9819 Other pulmonary collapse: Secondary | ICD-10-CM | POA: Diagnosis not present

## 2011-06-06 HISTORY — DX: Essential (primary) hypertension: I10

## 2011-06-06 LAB — LIPID PANEL
LDL Cholesterol: 78 mg/dL (ref 0–99)
Triglycerides: 134 mg/dL (ref <150)
VLDL: 27 mg/dL (ref 0–40)

## 2011-06-06 LAB — ABO/RH: ABO/RH(D): O POS

## 2011-06-07 LAB — BASIC METABOLIC PANEL
CO2: 24 mEq/L (ref 19–32)
Chloride: 99 mEq/L (ref 96–112)
GFR calc Af Amer: >90 mL/min (ref >90)
Potassium: 4.9 mEq/L (ref 3.5–5.1)
Sodium: 132 mEq/L — ABNORMAL LOW (ref 135–145)

## 2011-06-07 LAB — CBC
Platelets: 159 10*3/uL (ref 150–400)
RBC: 3.71 MIL/uL — ABNORMAL LOW (ref 3.87–5.11)
RDW: 13.2 % (ref 11.5–15.5)
WBC: 9.2 10*3/uL (ref 4.0–10.5)

## 2011-06-08 LAB — BASIC METABOLIC PANEL
BUN: 9 mg/dL (ref 6–23)
CO2: 26 mEq/L (ref 19–32)
Chloride: 100 mEq/L (ref 96–112)
Creatinine, Ser: 0.64 mg/dL (ref 0.50–1.10)
GFR calc Af Amer: >90 mL/min (ref >90)
Glucose, Bld: 144 mg/dL — ABNORMAL HIGH (ref 70–99)
Potassium: 3.5 mEq/L (ref 3.5–5.1)

## 2011-06-08 LAB — CBC
HCT: 26 % — ABNORMAL LOW (ref 36.0–46.0)
Hemoglobin: 8.5 g/dL — ABNORMAL LOW (ref 12.0–15.0)
MCV: 85.5 fL (ref 78.0–100.0)
RBC: 3.04 MIL/uL — ABNORMAL LOW (ref 3.87–5.11)
RDW: 13.6 % (ref 11.5–15.5)
WBC: 5.7 10*3/uL (ref 4.0–10.5)

## 2011-06-09 ENCOUNTER — Encounter (HOSPITAL_COMMUNITY): Payer: Self-pay

## 2011-06-09 ENCOUNTER — Inpatient Hospital Stay (HOSPITAL_COMMUNITY): Payer: Medicare Other

## 2011-06-09 ENCOUNTER — Other Ambulatory Visit: Payer: Self-pay

## 2011-06-09 LAB — CBC
HCT: 26.9 % — ABNORMAL LOW (ref 36.0–46.0)
Hemoglobin: 9 g/dL — ABNORMAL LOW (ref 12.0–15.0)
MCHC: 33.5 g/dL (ref 30.0–36.0)
MCV: 84.9 fL (ref 78.0–100.0)

## 2011-06-09 LAB — BASIC METABOLIC PANEL
BUN: 6 mg/dL (ref 6–23)
Creatinine, Ser: 0.66 mg/dL (ref 0.50–1.10)
GFR calc non Af Amer: 88 mL/min — ABNORMAL LOW (ref >90)
Glucose, Bld: 133 mg/dL — ABNORMAL HIGH (ref 70–99)
Potassium: 3.7 mEq/L (ref 3.5–5.1)

## 2011-06-09 MED ORDER — IOHEXOL 300 MG/ML  SOLN
100.0000 mL | Freq: Once | INTRAMUSCULAR | Status: AC | PRN
  Administered 2011-06-09: 100 mL via INTRAVENOUS

## 2011-06-10 LAB — BASIC METABOLIC PANEL
BUN: 7 mg/dL (ref 6–23)
Calcium: 8.6 mg/dL (ref 8.4–10.5)
GFR calc non Af Amer: 90 mL/min — ABNORMAL LOW (ref >90)
Glucose, Bld: 136 mg/dL — ABNORMAL HIGH (ref 70–99)
Sodium: 131 mEq/L — ABNORMAL LOW (ref 135–145)

## 2011-06-10 LAB — CBC
HCT: 24.7 % — ABNORMAL LOW (ref 36.0–46.0)
Hemoglobin: 8.3 g/dL — ABNORMAL LOW (ref 12.0–15.0)
MCH: 28.5 pg (ref 26.0–34.0)
MCHC: 33.6 g/dL (ref 30.0–36.0)
RDW: 13.7 % (ref 11.5–15.5)

## 2011-06-10 NOTE — Op Note (Signed)
[name], [name]               ACCOUNT NO.:  [id number]  MEDICAL RECORD NO.:  [id number]  LOCATION:  [id number]                         FACILITY:  [facility]  PHYSICIAN:  Gaynelle Arabian, M.D.    DATE OF BIRTH:  [birth year]  DATE OF PROCEDURE:  06/06/2011 DATE OF DISCHARGE:                              OPERATIVE REPORT   PREOPERATIVE DIAGNOSIS:  Osteoarthritis, left knee.  POSTOPERATIVE DIAGNOSIS:  Osteoarthritis, left knee.  PROCEDURE:  Left total knee arthroplasty.  SURGEON:  Gaynelle Arabian, M.D.  ASSISTANT:  [name] Perkins, P.A.C.  ANESTHESIA:  General.  ESTIMATED BLOOD LOSS:  250.  DRAINS:  Hemovac x1.  TOURNIQUET TIME:  4 minutes at 300 mmHg.  Tourniquet was not functioning correctly, thus had to be deflated at 4 minutes.  COMPLICATIONS:  None.  CONDITION:  Stable to recovery.  FINDINGS:  Bone-on-bone arthritis in the medial compartment with large marginal osteophyte formation and bone-on-bone patellofemoral compartment with large patellar osteophytes.  The tibia was also subluxed on the femur.  BRIEF CLINICAL INDICATION:  Deanna [name] is a 70 year old female with advanced end-stage arthritis of the left knee with progressively worsening pain and dysfunction.  She has had pain for many years, now much worse in the past year.  She has had a marked limitations in function, is now having pain at rest and difficulty with ADLs. She has had interventions including analgesics and injections of corticosteroids.  The injection are no longer beneficial.  She is not a candidate for physical therapy given the extent of her disease and the extent of pain she is in.  She has tried home exercises without benefit. She has radiographic changes with bone on bone in the medial and patellofemoral compartments with large marginal osteophytes.  She presents now for total knee arthroplasty.  PROCEDURE IN DETAIL:  After successful administration of general anesthetic, a tourniquet was  placed high on her left thigh and her left lower extremity was prepped and draped in the usual sterile fashion. Extremity was wrapped in Esmarch, knee flexed, tourniquet inflated to 300 mmHg.  Midline incision was made with a #10 blade through subcutaneous tissue to the level of the extensor mechanism.  A fresh blade was used to make a medial parapatellar arthrotomy.  Soft tissue on the proximal medial tibia subperiosteally elevated to the joint line with the knife and into the semimembranosus bursa with a Cobb elevator. Soft tissue laterally was elevated with attention being paid to avoiding the patellar tendon on tibial tubercle.  Patella was everted, knee flexed 90 degrees.  At this point, it was obvious the tourniquet was not functional.  This allowed back bleeding from the tourniquet.  It was functioning as a venous tourniquet.  We deflated it and the bleeding ceased.  ACL and PCL then removed.  Drill was used create a starting hole in the distal femur and the canal was thoroughly irrigated.  A 5 degree left valgus alignment guide was placed, and the distal femoral cutting block was pinned to remove 10 mm of the distal femur.  Distal femoral resection was made with an oscillating saw.  The tibia subluxed forward and the menisci removed.  Extramedullary tibial alignment guide was  placed, referencing proximally at the medial aspect of tibial tubercle and distally along the second metatarsal axis of the tibial crest.  Block was pinned to remove 2 mm off the more deficient medial side.  Tibial resection was made with an oscillating saw.  Size 3 was most appropriate tibial component and the proximal tibia was prepared to modular drill and keel punch for the size 3.  Femoral sizing guide was placed, size 3 was the most appropriate femur. The size 3 rotations was marked off the epicondylar axis and confirmed by creating rectangular flexion gap at 90 degrees.  The block was pinned in this  rotation, and the anteroposterior and chamfer cuts made. Intercondylar block was placed and that cut was made.  Trial size 3 posterior stabilized femur was placed.  A 10 mm posterior stabilized rotating platform insert trial was placed.  With the 10, full extensions achieved with excellent varus-valgus and anteroposterior balance throughout full range of motion.  Patella was everted, thickness measured to be 22 mm.  Freehand resection taken to 13 mm, 35 template was placed, lug holes were drilled, trial patella was placed and it tracks normally.  Osteophytes removed off the posterior femur with the trial in place.  All trials were removed and the cut bone surfaces were prepared with pulsatile lavage.  Cement was mixed and once ready for implantation, the size 3 mobile bearing tibial tray, size 3 posterior stabilized femur 35 patella were cemented into place.  The patella was held with a clamp.  Trial 10 mm inserts placed, knee held in full extension and all extruded cement removed.  When the cement had fully hardened, then the permanent 10 mm posterior stabilized rotating platform insert was placed in tibial tray.  Wounds copiously irrigated with saline solution and the arthrotomy closed over Hemovac drain with interrupted #1 PDS.  There.  Flexion against gravity is 225 degrees at which point the calf and posterior thigh were touching.  Patella tracks normally.  Subcu was closed with interrupted 2-0 Vicryl, and subcuticular running 4-0 Monocryl.  Catheter for Marcaine pain pump was placed and pump was initiated.  Incisions cleaned and dried and Steri- Strips and a bulky sterile dressing were applied.  She was then placed into a knee immobilizer, awakened, and transported to the recovery in stable condition.  Please note that there was a medical necessity for surgical assistant in this procedure in order to perform it in a safe and expeditious manner. Surgical assistant was necessary for  retraction of ligaments and vital neurovascular structures as well as for proper positioning of the limb to allow for anatomic placement in the prosthesis.     Gaynelle Arabian, M.D.     FA/MEDQ  D:  06/06/2011  T:  06/07/2011  Job:  [id number]  Electronically Signed by Gaynelle Arabian M.D. on 06/10/2011 09:55:39 AM
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
## 2011-06-11 LAB — CROSSMATCH: Unit division: 0
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
## 2011-07-14 ENCOUNTER — Other Ambulatory Visit: Payer: Self-pay | Admitting: Cardiovascular Disease

## 2011-07-14 DIAGNOSIS — N2889 Other specified disorders of kidney and ureter: Secondary | ICD-10-CM

## 2011-08-10 ENCOUNTER — Other Ambulatory Visit: Payer: Medicare Other

## 2011-11-07 ENCOUNTER — Other Ambulatory Visit: Payer: Self-pay | Admitting: Cardiovascular Disease

## 2011-11-15 ENCOUNTER — Ambulatory Visit (HOSPITAL_COMMUNITY)
Admission: RE | Admit: 2011-11-15 | Discharge: 2011-11-15 | Disposition: A | Discharge status: Home / Self Care | Payer: Medicare Other | Source: Ambulatory Visit | Attending: Cardiovascular Disease | Admitting: Cardiovascular Disease

## 2011-11-15 ENCOUNTER — Encounter (HOSPITAL_COMMUNITY)
Admission: RE | Disposition: A | Discharge status: Home / Self Care | Payer: Self-pay | Source: Ambulatory Visit | Attending: Cardiovascular Disease

## 2011-11-15 DIAGNOSIS — I2510 Atherosclerotic heart disease of native coronary artery without angina pectoris: Secondary | ICD-10-CM | POA: Insufficient documentation

## 2011-11-15 DIAGNOSIS — I70219 Atherosclerosis of native arteries of extremities with intermittent claudication, unspecified extremity: Secondary | ICD-10-CM | POA: Insufficient documentation

## 2011-11-15 HISTORY — PX: UNILATERAL UPPER EXTREMEITY ANGIOGRAM: SHX5517

## 2011-11-15 LAB — POTASSIUM: Potassium: 4.6 mEq/L (ref 3.5–5.1)

## 2011-11-15 SURGERY — UNILATERAL UPPER EXTREMEITY ANGIOGRAM
Anesthesia: LOCAL | Laterality: Left

## 2011-11-15 MED ORDER — ACETAMINOPHEN 325 MG PO TABS: 650.0000 mg | ORAL_TABLET | ORAL | Status: DC | PRN

## 2011-11-15 MED ORDER — DIAZEPAM 5 MG PO TABS
ORAL_TABLET | ORAL | Status: AC
  Filled 2011-11-15: qty 1

## 2011-11-15 MED ORDER — DIAZEPAM 5 MG PO TABS
5.0000 mg | ORAL_TABLET | ORAL | Status: AC
  Administered 2011-11-15: 5 mg via ORAL

## 2011-11-15 MED ORDER — SODIUM CHLORIDE 0.9 % IV SOLN
INTRAVENOUS | Status: DC
  Administered 2011-11-15: 11:00:00 via INTRAVENOUS

## 2011-11-15 MED ORDER — MIDAZOLAM HCL 2 MG/2ML IJ SOLN
INTRAMUSCULAR | Status: AC
  Filled 2011-11-15: qty 2

## 2011-11-15 MED ORDER — SODIUM CHLORIDE 0.9 % IJ SOLN: 3.0000 mL | INTRAMUSCULAR | Status: DC | PRN

## 2011-11-15 MED ORDER — DIAZEPAM 5 MG PO TABS
ORAL_TABLET | ORAL | Status: AC
  Administered 2011-11-15: 5 mg via ORAL
  Filled 2011-11-15: qty 1

## 2011-11-15 MED ORDER — ASPIRIN 81 MG PO CHEW
324.0000 mg | CHEWABLE_TABLET | ORAL | Status: AC
  Administered 2011-11-15: 324 mg via ORAL

## 2011-11-15 MED ORDER — SODIUM CHLORIDE 0.9 % IV SOLN: 250.0000 mL | INTRAVENOUS | Status: DC | PRN

## 2011-11-15 MED ORDER — ONDANSETRON HCL 4 MG/2ML IJ SOLN: 4.0000 mg | Freq: Four times a day (QID) | INTRAMUSCULAR | Status: DC | PRN

## 2011-11-15 MED ORDER — SODIUM CHLORIDE 0.9 % IV SOLN: INTRAVENOUS | Status: AC

## 2011-11-15 MED ORDER — FENTANYL CITRATE 0.05 MG/ML IJ SOLN
INTRAMUSCULAR | Status: AC
  Filled 2011-11-15: qty 2

## 2011-11-15 MED ORDER — ASPIRIN 81 MG PO CHEW
CHEWABLE_TABLET | ORAL | Status: AC
  Administered 2011-11-15: 324 mg via ORAL
  Filled 2011-11-15: qty 4

## 2011-11-15 NOTE — Op Note (Signed)
Deanna [name] is a 71 y.o. female    [id number] LOCATION:  FACILITY: [facility]  PHYSICIAN: Quay Burow, M.D. September 16, 1940   DATE OF PROCEDURE:  11/15/2011  DATE OF DISCHARGE:  [facility]  CARDIAC CATHETERIZATION     History obtained from chart review.Deanna [name] is a 71 year old female patient Dr. [name] for history of CAD status post remote coronary artery bypass grafting. 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 Problems include hypertension, hyperlipidemia and obesity. She was seen in the office complaining of left upper extremity claudication and Doppler suggested a left subclavian artery stenosis. Her left internal mammary artery to her LAD has been documented to be atretic a cath in 2002 by myself. Should just offer angiography and potential percutaneous intervention for lifestyle limiting left upper extremity claudication.   PROCEDURE DESCRIPTION:    The patient was brought to the second floor  [facility] lab in the postabsorptive state. She was  premedicated with Valium 5 mg by mouth, and IV Versed and fentanyl.. Her left groin was prepped and shaved in usual sterile fashion. Xylocaine 1% was used  for local anesthesia. A 5 French sheath was inserted into the left common femoral  artery using standard Seldinger technique. A 5 French long pigtail catheter was used for aortic arch angiography, and a 5 French right Judkins catheter was used for selective right innominate and left subclavian artery angiography. Visipaque dye was used for the entirety of the case. A retrograde aortic pressures monitored the case.  HEMODYNAMICS:    AO SYSTOLIC/AO DIASTOLIC: [id number]    ANGIOGRAPHIC RESULTS:   1: Arch aortogram-type I aortic arch  2: Right innominate artery-widely patent. There was no delayed retrograde  filling of the left vertebral artery.  3: Left subclavian artery-subtotally occluded with a highly calcified proximal plaque. I could not adequately visualize the left vertebral artery.  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 I believe this is high risk for percutaneous intervention secondary to the calcific nature of the disease and the fact that these vertebral artery is not well visualized. Only the best option would be left common carotid to subclavian bypass grafting.  The sheath was removed and pressure was held on the groin to achieve hemostasis. The patient left the Cath Lab in stable condition. She was gently hydrated, discharged home. She'll follow up with Dr. Terance Ice.  Lorretta Harp MD, [facility] 11/15/2011 1:35 PM

## 2011-11-15 NOTE — Discharge Instructions (Signed)
Groin Site Care Refer to this sheet in the next few weeks. These instructions provide you with information on caring for yourself after your procedure. Your caregiver may also give you more specific instructions. Your treatment has been planned according to current medical practices, but problems sometimes occur. Call your caregiver if you have any problems or questions after your procedure. HOME CARE INSTRUCTIONS  You may shower 24 hours after the procedure. Remove the bandage (dressing) and gently wash the site with plain soap and water. Gently pat the site dry.   Do not apply powder or lotion to the site.   Do not sit in a bathtub, swimming pool, or whirlpool for 5 to 7 days.   No bending, squatting, or lifting anything over 10 pounds (4.5 kg) as directed by your caregiver.   Inspect the site at least twice daily.   Do not drive home if you are discharged the same day of the procedure. Have someone else drive you.   You may drive 24 hours after the procedure unless otherwise instructed by your caregiver.  What to expect:  Any bruising will usually fade within 1 to 2 weeks.   Blood that collects in the tissue (hematoma) may be painful to the touch. It should usually decrease in size and tenderness within 1 to 2 weeks.  SEEK IMMEDIATE MEDICAL CARE IF:  You have unusual pain at the groin site or down the affected leg.   You have redness, warmth, swelling, or pain at the groin site.   You have drainage (other than a small amount of blood on the dressing).   You have chills.   You have a fever or persistent symptoms for more than 72 hours.   You have a fever and your symptoms suddenly get worse.   Your leg becomes pale, cool, tingly, or numb.   You have heavy bleeding from the site. Hold pressure on the site.  Document Released: 08/27/2010 Document Revised: 07/14/2011 Document Reviewed: 08/27/2010 [facility] Patient Information 2012 [place], [place].

## 2011-11-15 NOTE — H&P (Signed)
  H & P will be scanned in.  Pt was reexamined and existing H & P reviewed. No changes found.  Lorretta Harp, MD [facility] 11/15/2011 1:04 PM

## 2011-11-25 ENCOUNTER — Encounter: Payer: Self-pay | Admitting: Surgery

## 2011-11-28 ENCOUNTER — Ambulatory Visit (INDEPENDENT_AMBULATORY_CARE_PROVIDER_SITE_OTHER): Payer: Medicare Other | Admitting: Surgery

## 2011-11-28 ENCOUNTER — Encounter: Payer: Self-pay | Admitting: Surgery

## 2011-11-28 ENCOUNTER — Other Ambulatory Visit: Payer: Self-pay | Admitting: *Deleted

## 2011-11-28 VITALS — BP 154/70 | HR 72 | Temp 99.0°F | Ht 62.0 in | Wt 205.0 lb

## 2011-11-28 DIAGNOSIS — G458 Other transient cerebral ischemic attacks and related syndromes: Secondary | ICD-10-CM | POA: Insufficient documentation
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
## 2011-12-02 ENCOUNTER — Encounter: Payer: Self-pay | Admitting: Internal Medicine

## 2011-12-02 ENCOUNTER — Encounter (HOSPITAL_COMMUNITY): Payer: Self-pay | Admitting: Respiratory Therapy

## 2011-12-08 ENCOUNTER — Encounter (HOSPITAL_COMMUNITY): Payer: Self-pay

## 2011-12-08 ENCOUNTER — Encounter (HOSPITAL_COMMUNITY)
Admission: RE | Admit: 2011-12-08 | Discharge: 2011-12-08 | Disposition: A | Discharge status: Home / Self Care | Payer: Medicare Other | Source: Ambulatory Visit | Attending: Surgery | Admitting: Surgery

## 2011-12-08 LAB — CBC
HCT: 41.3 % (ref 36.0–46.0)
Hemoglobin: 14.1 g/dL (ref 12.0–15.0)
MCHC: 34.1 g/dL (ref 30.0–36.0)
RBC: 5.14 MIL/uL — ABNORMAL HIGH (ref 3.87–5.11)
WBC: 7.7 10*3/uL (ref 4.0–10.5)

## 2011-12-08 LAB — URINALYSIS, ROUTINE W REFLEX MICROSCOPIC
Glucose, UA: NEGATIVE mg/dL
Hgb urine dipstick: NEGATIVE
Ketones, ur: NEGATIVE mg/dL
pH: 6 (ref 5.0–8.0)

## 2011-12-08 LAB — COMPREHENSIVE METABOLIC PANEL
ALT: 17 U/L (ref 0–35)
Alkaline Phosphatase: 119 U/L — ABNORMAL HIGH (ref 39–117)
CO2: 26 mEq/L (ref 19–32)
Chloride: 101 mEq/L (ref 96–112)
GFR calc Af Amer: >90 mL/min (ref >90)
Glucose, Bld: 98 mg/dL (ref 70–99)
Potassium: 4.5 mEq/L (ref 3.5–5.1)
Sodium: 137 mEq/L (ref 135–145)
Total Bilirubin: 0.8 mg/dL (ref 0.3–1.2)
Total Protein: 7.2 g/dL (ref 6.0–8.3)

## 2011-12-08 LAB — SURGICAL PCR SCREEN: MRSA, PCR: NEGATIVE

## 2011-12-08 LAB — PROTIME-INR
INR: 1.03 (ref 0.00–1.49)
Prothrombin Time: 13.7 seconds (ref 11.6–15.2)

## 2011-12-08 LAB — URINE MICROSCOPIC-ADD ON

## 2011-12-08 NOTE — Pre-Procedure Instructions (Signed)
21 Deanna [name]  12/08/2011   Your procedure is scheduled on:  May 9 @ 0730  Report to [facility].  Call this number if you have problems the morning of surgery: [phone]   Remember:   Do not eat food:After Midnight.  May have clear liquids: up to 4 Hours before arrival.  Clear liquids include soda, tea, black coffee, apple or grape juice, broth.  Take these medicines the morning of surgery with A SIP OF WATER: amlodipine,synthroid,metoprolol,prilosec,zoloft,   Do not wear jewelry, make-up or nail polish.  Do not wear lotions, powders, or perfumes. You may wear deodorant.  Do not shave 48 hours prior to surgery.  Do not bring valuables to the hospital.  Contacts, dentures or bridgework may not be worn into surgery.  Leave suitcase in the car. After surgery it may be brought to your room.  For patients admitted to the hospital, checkout time is 11:00 AM the day of discharge.   Patients discharged the day of surgery will not be allowed to drive home.  Name and phone number of your driver: daughter  Special Instructions: CHG Shower Use Special Wash: 1/2 bottle night before surgery and 1/2 bottle morning of surgery.   Please read over the following fact sheets that you were given: Pain Booklet, Coughing and Deep Breathing, Blood Transfusion Information, MRSA Information and Surgical Site Infection Prevention

## 2011-12-08 NOTE — Progress Notes (Signed)
Requested ekg ov note any studies dr [name].

## 2011-12-14 MED ORDER — DEXTROSE 5 % IV SOLN
1.5000 g | INTRAVENOUS | Status: AC
  Administered 2011-12-15: 1.5 g via INTRAVENOUS
  Filled 2011-12-14: qty 1.5

## 2011-12-15 ENCOUNTER — Encounter (HOSPITAL_COMMUNITY): Payer: Self-pay | Admitting: *Deleted

## 2011-12-15 ENCOUNTER — Encounter (HOSPITAL_COMMUNITY)
Admission: RE | Disposition: A | Discharge status: Home / Self Care | Payer: Self-pay | Source: Ambulatory Visit | Attending: Surgery

## 2011-12-15 ENCOUNTER — Ambulatory Visit (HOSPITAL_COMMUNITY): Payer: Medicare Other | Admitting: Anesthesiology

## 2011-12-15 ENCOUNTER — Inpatient Hospital Stay (HOSPITAL_COMMUNITY): Payer: Medicare Other

## 2011-12-15 ENCOUNTER — Encounter (HOSPITAL_COMMUNITY): Payer: Self-pay | Admitting: Anesthesiology

## 2011-12-15 ENCOUNTER — Inpatient Hospital Stay (HOSPITAL_COMMUNITY)
Admission: RE | Admit: 2011-12-15 | Discharge: 2011-12-16 | DRG: 039 | Disposition: A | Discharge status: Home / Self Care | Payer: Medicare Other | Source: Ambulatory Visit | Attending: Surgery | Admitting: Surgery

## 2011-12-15 ENCOUNTER — Encounter (HOSPITAL_COMMUNITY): Payer: Self-pay | Admitting: Acute Care

## 2011-12-15 DIAGNOSIS — K219 Gastro-esophageal reflux disease without esophagitis: Secondary | ICD-10-CM | POA: Diagnosis present

## 2011-12-15 DIAGNOSIS — Z860100 Personal history of colon polyps, unspecified: Secondary | ICD-10-CM

## 2011-12-15 DIAGNOSIS — E7800 Pure hypercholesterolemia, unspecified: Secondary | ICD-10-CM | POA: Diagnosis present

## 2011-12-15 DIAGNOSIS — F341 Dysthymic disorder: Secondary | ICD-10-CM | POA: Diagnosis present

## 2011-12-15 DIAGNOSIS — E039 Hypothyroidism, unspecified: Secondary | ICD-10-CM | POA: Diagnosis present

## 2011-12-15 DIAGNOSIS — Z01812 Encounter for preprocedural laboratory examination: Secondary | ICD-10-CM

## 2011-12-15 DIAGNOSIS — I10 Essential (primary) hypertension: Secondary | ICD-10-CM | POA: Diagnosis present

## 2011-12-15 DIAGNOSIS — I2510 Atherosclerotic heart disease of native coronary artery without angina pectoris: Secondary | ICD-10-CM | POA: Diagnosis present

## 2011-12-15 DIAGNOSIS — I708 Atherosclerosis of other arteries: Secondary | ICD-10-CM

## 2011-12-15 DIAGNOSIS — I252 Old myocardial infarction: Secondary | ICD-10-CM

## 2011-12-15 DIAGNOSIS — G458 Other transient cerebral ischemic attacks and related syndromes: Principal | ICD-10-CM | POA: Diagnosis present

## 2011-12-15 HISTORY — DX: Other specified disorders of nose and nasal sinuses: J34.89

## 2011-12-15 HISTORY — PX: CAROTID-SUBCLAVIAN BYPASS GRAFT: SHX910

## 2011-12-15 SURGERY — CREATION, BYPASS, ARTERIAL, SUBCLAVIAN TO CAROTID, USING GRAFT
Anesthesia: General | Site: Neck | Laterality: Left | Wound class: Clean

## 2011-12-15 MED ORDER — FENTANYL CITRATE 0.05 MG/ML IJ SOLN
INTRAMUSCULAR | Status: DC | PRN
  Administered 2011-12-15: 100 ug via INTRAVENOUS

## 2011-12-15 MED ORDER — SERTRALINE HCL 50 MG PO TABS
50.0000 mg | ORAL_TABLET | Freq: Every day | ORAL | Status: DC
  Administered 2011-12-15: 50 mg via ORAL
  Filled 2011-12-15 (×2): qty 1

## 2011-12-15 MED ORDER — METOPROLOL TARTRATE 1 MG/ML IV SOLN: 2.0000 mg | INTRAVENOUS | Status: DC | PRN

## 2011-12-15 MED ORDER — DOCUSATE SODIUM 100 MG PO CAPS
100.0000 mg | ORAL_CAPSULE | Freq: Every day | ORAL | Status: DC
  Administered 2011-12-16: 100 mg via ORAL
  Filled 2011-12-15: qty 1

## 2011-12-15 MED ORDER — SODIUM CHLORIDE 0.9 % IV SOLN: 500.0000 mL | Freq: Once | INTRAVENOUS | Status: AC | PRN

## 2011-12-15 MED ORDER — METOPROLOL TARTRATE 50 MG PO TABS
50.0000 mg | ORAL_TABLET | Freq: Two times a day (BID) | ORAL | Status: DC
  Administered 2011-12-16: 50 mg via ORAL
  Filled 2011-12-15 (×3): qty 1

## 2011-12-15 MED ORDER — DEXTROSE 5 % IV SOLN
1.5000 g | Freq: Two times a day (BID) | INTRAVENOUS | Status: AC
  Administered 2011-12-15 – 2011-12-16 (×2): 1.5 g via INTRAVENOUS
  Filled 2011-12-15 (×3): qty 1.5

## 2011-12-15 MED ORDER — OXYCODONE-ACETAMINOPHEN 5-325 MG PO TABS
1.0000 | ORAL_TABLET | ORAL | Status: DC | PRN
  Administered 2011-12-16 (×2): 1 via ORAL
  Filled 2011-12-15 (×2): qty 1

## 2011-12-15 MED ORDER — MIDAZOLAM HCL 5 MG/5ML IJ SOLN
INTRAMUSCULAR | Status: DC | PRN
  Administered 2011-12-15: 2 mg via INTRAVENOUS

## 2011-12-15 MED ORDER — LACTATED RINGERS IV SOLN
INTRAVENOUS | Status: DC | PRN
  Administered 2011-12-15 (×3): via INTRAVENOUS

## 2011-12-15 MED ORDER — LABETALOL HCL 5 MG/ML IV SOLN: 10.0000 mg | INTRAVENOUS | Status: DC | PRN

## 2011-12-15 MED ORDER — GLYCOPYRROLATE 0.2 MG/ML IJ SOLN
INTRAMUSCULAR | Status: DC | PRN
  Administered 2011-12-15: .6 mg via INTRAVENOUS

## 2011-12-15 MED ORDER — ROCURONIUM BROMIDE 100 MG/10ML IV SOLN
INTRAVENOUS | Status: DC | PRN
  Administered 2011-12-15: 10 mg via INTRAVENOUS
  Administered 2011-12-15: 50 mg via INTRAVENOUS
  Administered 2011-12-15: 10 mg via INTRAVENOUS

## 2011-12-15 MED ORDER — HYDROMORPHONE HCL PF 1 MG/ML IJ SOLN
0.5000 mg | INTRAMUSCULAR | Status: DC | PRN
  Administered 2011-12-15 – 2011-12-16 (×3): 1 mg via INTRAVENOUS
  Filled 2011-12-15 (×3): qty 1

## 2011-12-15 MED ORDER — MORPHINE SULFATE 4 MG/ML IJ SOLN: 0.0500 mg/kg | INTRAMUSCULAR | Status: DC | PRN

## 2011-12-15 MED ORDER — ACETAMINOPHEN 325 MG PO TABS: 325.0000 mg | ORAL_TABLET | ORAL | Status: DC | PRN

## 2011-12-15 MED ORDER — ASPIRIN 81 MG PO TABS: 81.0000 mg | ORAL_TABLET | Freq: Every day | ORAL | Status: DC

## 2011-12-15 MED ORDER — CLOPIDOGREL BISULFATE 75 MG PO TABS: 75.0000 mg | ORAL_TABLET | Freq: Every day | ORAL | Status: DC

## 2011-12-15 MED ORDER — ATORVASTATIN CALCIUM 20 MG PO TABS
20.0000 mg | ORAL_TABLET | Freq: Every day | ORAL | Status: DC
  Filled 2011-12-15: qty 1

## 2011-12-15 MED ORDER — PHENOL 1.4 % MT LIQD: 1.0000 | OROMUCOSAL | Status: DC | PRN

## 2011-12-15 MED ORDER — SERTRALINE HCL 50 MG PO TABS: 50.0000 mg | ORAL_TABLET | Freq: Two times a day (BID) | ORAL | Status: DC

## 2011-12-15 MED ORDER — HEMOSTATIC AGENTS (NO CHARGE) OPTIME
TOPICAL | Status: DC | PRN
  Administered 2011-12-15 (×2): 2 via TOPICAL

## 2011-12-15 MED ORDER — ONDANSETRON HCL 4 MG/2ML IJ SOLN
INTRAMUSCULAR | Status: DC | PRN
  Administered 2011-12-15: 4 mg via INTRAVENOUS

## 2011-12-15 MED ORDER — ALUM & MAG HYDROXIDE-SIMETH 200-200-20 MG/5ML PO SUSP: 15.0000 mL | ORAL | Status: DC | PRN

## 2011-12-15 MED ORDER — PROTAMINE SULFATE 10 MG/ML IV SOLN
INTRAVENOUS | Status: DC | PRN
  Administered 2011-12-15: 50 mg via INTRAVENOUS

## 2011-12-15 MED ORDER — LIDOCAINE HCL (CARDIAC) 20 MG/ML IV SOLN
INTRAVENOUS | Status: DC | PRN
  Administered 2011-12-15: 100 mg via INTRAVENOUS
  Administered 2011-12-15: 50 mg via INTRAVENOUS
  Administered 2011-12-15: 100 mg via INTRAVENOUS

## 2011-12-15 MED ORDER — ONDANSETRON HCL 4 MG/2ML IJ SOLN
4.0000 mg | Freq: Once | INTRAMUSCULAR | Status: DC | PRN
Start: 2011-12-15 — End: 2011-12-15

## 2011-12-15 MED ORDER — ACETAMINOPHEN 650 MG RE SUPP: 325.0000 mg | RECTAL | Status: DC | PRN

## 2011-12-15 MED ORDER — AMLODIPINE BESYLATE 5 MG PO TABS
5.0000 mg | ORAL_TABLET | Freq: Every day | ORAL | Status: DC
  Administered 2011-12-16: 5 mg via ORAL
  Filled 2011-12-15: qty 1

## 2011-12-15 MED ORDER — HEPARIN SODIUM (PORCINE) 1000 UNIT/ML IJ SOLN
INTRAMUSCULAR | Status: DC | PRN
  Administered 2011-12-15: 1 mL via INTRAVENOUS
  Administered 2011-12-15: 6 mL via INTRAVENOUS

## 2011-12-15 MED ORDER — POTASSIUM CHLORIDE CRYS ER 20 MEQ PO TBCR: 20.0000 meq | EXTENDED_RELEASE_TABLET | Freq: Once | ORAL | Status: AC | PRN

## 2011-12-15 MED ORDER — SODIUM CHLORIDE 0.9 % IV SOLN: INTRAVENOUS | Status: DC

## 2011-12-15 MED ORDER — ASPIRIN EC 325 MG PO TBEC
325.0000 mg | DELAYED_RELEASE_TABLET | Freq: Every day | ORAL | Status: DC
  Administered 2011-12-15 – 2011-12-16 (×2): 325 mg via ORAL
  Filled 2011-12-15 (×2): qty 1

## 2011-12-15 MED ORDER — ONDANSETRON HCL 4 MG/2ML IJ SOLN: 4.0000 mg | Freq: Four times a day (QID) | INTRAMUSCULAR | Status: DC | PRN

## 2011-12-15 MED ORDER — 0.9 % SODIUM CHLORIDE (POUR BTL) OPTIME
TOPICAL | Status: DC | PRN
  Administered 2011-12-15: 1000 mL

## 2011-12-15 MED ORDER — LEVOTHYROXINE SODIUM 175 MCG PO TABS
175.0000 ug | ORAL_TABLET | Freq: Every day | ORAL | Status: DC
  Administered 2011-12-16: 175 ug via ORAL
  Filled 2011-12-15: qty 1

## 2011-12-15 MED ORDER — GUAIFENESIN-DM 100-10 MG/5ML PO SYRP: 15.0000 mL | ORAL_SOLUTION | ORAL | Status: DC | PRN

## 2011-12-15 MED ORDER — POLYETHYLENE GLYCOL 3350 17 G PO PACK
17.0000 g | PACK | Freq: Every day | ORAL | Status: DC | PRN
  Filled 2011-12-15: qty 1

## 2011-12-15 MED ORDER — SERTRALINE HCL 100 MG PO TABS
100.0000 mg | ORAL_TABLET | Freq: Every day | ORAL | Status: DC
  Administered 2011-12-16: 100 mg via ORAL
  Filled 2011-12-15: qty 1

## 2011-12-15 MED ORDER — PROPOFOL 10 MG/ML IV BOLUS
INTRAVENOUS | Status: DC | PRN
  Administered 2011-12-15: 200 mg via INTRAVENOUS

## 2011-12-15 MED ORDER — DEXTROSE-NACL 5-0.9 % IV SOLN
INTRAVENOUS | Status: DC
  Administered 2011-12-15 (×2): via INTRAVENOUS

## 2011-12-15 MED ORDER — SODIUM CHLORIDE 0.9 % IV SOLN
10.0000 mg | INTRAVENOUS | Status: DC | PRN
  Administered 2011-12-15: 40 ug/min via INTRAVENOUS

## 2011-12-15 MED ORDER — HYDROMORPHONE HCL PF 1 MG/ML IJ SOLN: 0.2500 mg | INTRAMUSCULAR | Status: DC | PRN

## 2011-12-15 MED ORDER — HYDRALAZINE HCL 20 MG/ML IJ SOLN: 10.0000 mg | INTRAMUSCULAR | Status: DC | PRN

## 2011-12-15 MED ORDER — SODIUM CHLORIDE 0.9 % IR SOLN
Status: DC | PRN
  Administered 2011-12-15: 09:00:00

## 2011-12-15 MED ORDER — PANTOPRAZOLE SODIUM 40 MG PO TBEC
40.0000 mg | DELAYED_RELEASE_TABLET | Freq: Every day | ORAL | Status: DC
  Administered 2011-12-16: 40 mg via ORAL
  Filled 2011-12-15: qty 1

## 2011-12-15 MED ORDER — BISACODYL 10 MG RE SUPP: 10.0000 mg | Freq: Every day | RECTAL | Status: DC | PRN

## 2011-12-15 MED ORDER — NEOSTIGMINE METHYLSULFATE 1 MG/ML IJ SOLN
INTRAMUSCULAR | Status: DC | PRN
  Administered 2011-12-15: 3 mg via INTRAVENOUS

## 2011-12-15 MED ORDER — IRBESARTAN 75 MG PO TABS
75.0000 mg | ORAL_TABLET | Freq: Every day | ORAL | Status: DC
  Administered 2011-12-15 – 2011-12-16 (×2): 75 mg via ORAL
  Filled 2011-12-15 (×2): qty 1

## 2011-12-15 SURGICAL SUPPLY — 60 items
BAG DECANTER FOR FLEXI CONT (MISCELLANEOUS) ×2 IMPLANT
CANISTER SUCTION 2500CC (MISCELLANEOUS) ×2 IMPLANT
CATH ROBINSON RED A/P 18FR (CATHETERS) ×2 IMPLANT
CATH SUCT 10FR WHISTLE TIP (CATHETERS) IMPLANT
CLIP TI MEDIUM 24 (CLIP) ×2 IMPLANT
CLIP TI WIDE RED SMALL 24 (CLIP) ×2 IMPLANT
CLOTH BEACON ORANGE TIMEOUT ST (SAFETY) ×2 IMPLANT
COVER SURGICAL LIGHT HANDLE (MISCELLANEOUS) ×4 IMPLANT
CRADLE DONUT ADULT HEAD (MISCELLANEOUS) ×2 IMPLANT
DERMABOND ADVANCED (GAUZE/BANDAGES/DRESSINGS) ×1
DERMABOND ADVANCED .7 DNX12 (GAUZE/BANDAGES/DRESSINGS) ×1 IMPLANT
DRAIN CHANNEL 15F RND FF W/TCR (WOUND CARE) IMPLANT
DRAPE WARM FLUID 44X44 (DRAPE) ×2 IMPLANT
ELECT CAUTERY BLADE 6.4 (BLADE) ×2 IMPLANT
ELECT REM PT RETURN 9FT ADLT (ELECTROSURGICAL) ×2
ELECTRODE REM PT RTRN 9FT ADLT (ELECTROSURGICAL) ×1 IMPLANT
EVACUATOR SILICONE 100CC (DRAIN) IMPLANT
GAUZE SPONGE 4X4 16PLY XRAY LF (GAUZE/BANDAGES/DRESSINGS) ×2 IMPLANT
GLOVE BIO SURGEON STRL SZ 6.5 (GLOVE) ×2 IMPLANT
GLOVE BIO SURGEON STRL SZ7 (GLOVE) ×2 IMPLANT
GLOVE BIOGEL PI IND STRL 6.5 (GLOVE) ×4 IMPLANT
GLOVE BIOGEL PI IND STRL 7.0 (GLOVE) ×1 IMPLANT
GLOVE BIOGEL PI IND STRL 7.5 (GLOVE) ×3 IMPLANT
GLOVE BIOGEL PI INDICATOR 6.5 (GLOVE) ×4
GLOVE BIOGEL PI INDICATOR 7.0 (GLOVE) ×1
GLOVE BIOGEL PI INDICATOR 7.5 (GLOVE) ×3
GLOVE ECLIPSE 6.5 STRL STRAW (GLOVE) ×4 IMPLANT
GLOVE SURG SS PI 7.5 STRL IVOR (GLOVE) ×2 IMPLANT
GOWN EXTRA PROTECTION XL (GOWNS) ×2 IMPLANT
GOWN PREVENTION PLUS XXLARGE (GOWN DISPOSABLE) ×4 IMPLANT
GOWN STRL NON-REIN LRG LVL3 (GOWN DISPOSABLE) ×6 IMPLANT
GRAFT HEMASHIELD 8MM (Vascular Products) ×2 IMPLANT
GRAFT VASC STRG 30X8KNIT (Vascular Products) ×1 IMPLANT
HEMOSTAT SNOW SURGICEL 2X4 (HEMOSTASIS) ×4 IMPLANT
HEMOSTAT SURGICEL 2X14 (HEMOSTASIS) IMPLANT
KIT BASIN OR (CUSTOM PROCEDURE TRAY) ×2 IMPLANT
KIT ROOM TURNOVER OR (KITS) ×2 IMPLANT
NS IRRIG 1000ML POUR BTL (IV SOLUTION) ×4 IMPLANT
PACK CAROTID (CUSTOM PROCEDURE TRAY) ×2 IMPLANT
PAD ARMBOARD 7.5X6 YLW CONV (MISCELLANEOUS) ×4 IMPLANT
PENCIL BUTTON HOLSTER BLD 10FT (ELECTRODE) ×2 IMPLANT
PUNCH AORTIC ROTATE 5MM 8IN (MISCELLANEOUS) ×2 IMPLANT
SEALANT SURG COSEAL 4ML (VASCULAR PRODUCTS) ×4 IMPLANT
SHUNT CAROTID BYPASS 10 (VASCULAR PRODUCTS) IMPLANT
SHUNT CAROTID BYPASS 12 (VASCULAR PRODUCTS) IMPLANT
SPECIMEN JAR SMALL (MISCELLANEOUS) IMPLANT
SUT ETHILON 3 0 PS 1 (SUTURE) IMPLANT
SUT PROLENE 5 0 C 1 24 (SUTURE) ×8 IMPLANT
SUT PROLENE 6 0 BV (SUTURE) ×2 IMPLANT
SUT PROLENE 6 0 C 1 30 (SUTURE) ×2 IMPLANT
SUT PROLENE 7 0 BV 1 (SUTURE) IMPLANT
SUT VIC AB 2-0 CT1 27 (SUTURE) ×1
SUT VIC AB 2-0 CT1 TAPERPNT 27 (SUTURE) ×1 IMPLANT
SUT VIC AB 3-0 SH 27 (SUTURE) ×1
SUT VIC AB 3-0 SH 27X BRD (SUTURE) ×1 IMPLANT
SUT VICRYL 4-0 PS2 18IN ABS (SUTURE) IMPLANT
TOWEL OR 17X24 6PK STRL BLUE (TOWEL DISPOSABLE) ×2 IMPLANT
TOWEL OR 17X26 10 PK STRL BLUE (TOWEL DISPOSABLE) ×2 IMPLANT
TRAY FOLEY CATH 14FRSI W/METER (CATHETERS) ×2 IMPLANT
WATER STERILE IRR 1000ML POUR (IV SOLUTION) ×2 IMPLANT

## 2011-12-15 NOTE — Anesthesia Preprocedure Evaluation (Addendum)
Anesthesia Evaluation  Patient identified by MRN, date of birth, ID band Patient awake    Reviewed: Allergy & Precautions, H&P , NPO status , Patient's Chart, lab work & pertinent test results, reviewed documented beta blocker date and time   Airway Mallampati: II TM Distance: >3 FB Neck ROM: Full    Dental  (+) Teeth Intact   Pulmonary  breath sounds clear to auscultation        Cardiovascular hypertension, Pt. on medications and Pt. on home beta blockers + CAD and + Past MI Rhythm:Regular Rate:Normal     Neuro/Psych Anxiety Depression    GI/Hepatic GERD-  Medicated and Controlled,  Endo/Other  Hypothyroidism   Renal/GU      Musculoskeletal   Abdominal   Peds  Hematology   Anesthesia Other Findings   Reproductive/Obstetrics                          Anesthesia Physical Anesthesia Plan  ASA: III  Anesthesia Plan: General   Post-op Pain Management:    Induction: Intravenous  Airway Management Planned: Oral ETT  Additional Equipment:   Intra-op Plan:   Post-operative Plan: Extubation in OR  Informed Consent: I have reviewed the patients History and Physical, chart, labs and discussed the procedure including the risks, benefits and alternatives for the proposed anesthesia with the patient or authorized representative who has indicated his/her understanding and acceptance.   Dental advisory given  Plan Discussed with: CRNA, Anesthesiologist and Surgeon  Anesthesia Plan Comments:         Anesthesia Quick Evaluation

## 2011-12-15 NOTE — Op Note (Signed)
[facility] [place]  Patient name: Deanna [name] MRN: [id number] DOB: 01-Aug-1941 Sex: female  12/15/2011 Pre-operative Diagnosis: Left subclavian artery occlusion Post-operative diagnosis:  Same Surgeon:  Eldridge Abrahams Assistants:  Wray Kearns, P.A Procedure:   Left carotid to subclavian bypass graft with 8 mm dacryon Anesthesia:  Gen. Blood Loss:  See anesthesia record Specimens:  None  Findings:  Calcified posterior plaque on the subclavian artery  Indications:  The patient was recently evaluated for left arm weakness and dizziness. She is found to have a very calcified, occluded subclavian artery. I discussed proceeding with operative revascularization. She is not a candidate for a transposition. The risks and benefits were discussed and the pre-procedure visit.  Procedure:  The patient was identified in the holding area and taken to [place]  The patient was then placed supine on the table. general anesthesia was administered.  The patient was prepped and draped in the usual sterile fashion.  A time out was called and antibiotics were administered.  A transverse supraclavicular incision was made from the midline for approximately 7 cm. Cautery was used to divide the subcutaneous tissue. The platysma muscle was divided with cautery. Subplatysmal flaps were then raised. I then identified the sternal and clavicular head of the sternocleidomastoid. A plane was created between the 2 heads of the muscle. I then isolated the internal jugular vein. It was skeletonized. The vagus nerve was identified and protected. I then sharply dissected out the common carotid artery. This was disease-free at this level and had an excellent pulse.  Next the scalene fat pad was mobilized to the patient's left. I identified the thoracic duct and divided between silk ties. I encountered the thoracic duct at several points during the case and each time it was ligated between 3-0 silk  ties. Once the fat pad was reflected I exposed the anterior scalene muscle. The phrenic nerve was visualized running across the muscle. I dissected out the phrenic nerve making sure to protect it throughout it's course. Once it was fully mobilized I then divided the anterior scalene muscle with Bovie cautery. Once the muscle was fully divided I had excellent access to the subclavian artery distal to the vertebral artery. The artery was relatively calcified at this level. I dissected it out as far as I could laterally. At this point the patient was fully heparinized. After the heparin circulated the subclavian artery was occluded with vascular clamps. A #11 blade was used to make an arteriotomy which was extended longitudinally with Potts scissors along the anterior side of the artery. I selected a dacryon, 8 mm graft. An end-to-side anastomosis was created with running 6-0 Prolene. Prior to completion the appropriate flushing maneuvers were performed and the clamps released. There was some bleeding from the posterior wall of the graft which was where the artery was very calcified. I placed 3 horizontal mattress 5-0 Prolene sutures with pledgets. At this point it was hemostatic. I then brought the graft posterior to the jugular vein and anterior to the vagus nerve. A site was selected on the posterior lateral side of the common carotid artery. The artery was occluded with vascular clamps. A #11 blade was used to make an arteriotomy. A 5 punch was used to extend the arteriotomy. The dacryon graft was then cut to the appropriate length. An end-to-side anastomosis was created with running 5-0 Prolene. Prior to completion all vessels were flushed appropriately. The anastomosis was then completed. The clamp was  released first on the subclavian artery followed by the proximal common carotid artery clamp and lastly the distal common carotid clamp. Doppler was used to evaluate the signals in all the arteries which were  appropriate. I was satisfied with hemostasis. 50 mg of protamine were administered. Was then reinspected and there appeared to be more bleeding from the posterior side of the subclavian anastomosis. I placed one additional repair stitch which seemed to result and hemostasis. I did placed CoSeal around the anastomosis. Was then irrigated there was no lymphatic leak there was no bleeding. I then reapproximated the sternal and clavicular heads of the sternocleidomastoid with a 2-0 Vicryl. The platysma muscles closed with 3-0 Vicryl the skin was closed with 4-0 Vicryl. Dermabond was placed on the incision The patient was then successfully awakened from anesthesia. She is found to be neurologically intact   Disposition:  To PACU in stable condition.   Theotis Burrow, M.D. [facility] [place] Office: [phone] Pager:  [phone]

## 2011-12-15 NOTE — OR Nursing (Signed)
[name] pa/ in for pulse chk /aware l radial not palapble but hand/fingers all warm/pink and brisk cap refill

## 2011-12-15 NOTE — Anesthesia Procedure Notes (Signed)
Procedure Name: Intubation Date/Time: 12/15/2011 8:11 AM Performed by: Carney Living Pre-anesthesia Checklist: Patient identified, Emergency Drugs available, Suction available, Patient being monitored and Timeout performed Patient Re-evaluated:Patient Re-evaluated prior to inductionOxygen Delivery Method: Circle system utilized Preoxygenation: Pre-oxygenation with 100% oxygen Intubation Type: IV induction Ventilation: Mask ventilation without difficulty and Oral airway inserted - appropriate to patient size Laryngoscope Size: Mac and 4 Grade View: Grade II Tube type: Oral Tube size: 7.5 mm Airway Equipment and Method: Stylet Placement Confirmation: ETT inserted through vocal cords under direct vision,  positive ETCO2 and breath sounds checked- equal and bilateral Secured at: 21 cm Dental Injury: Teeth and Oropharynx as per pre-operative assessment

## 2011-12-15 NOTE — Transfer of Care (Signed)
Immediate Anesthesia Transfer of Care Note  Patient: Deanna [name]  Procedure(s) Performed: Procedure(s) (LRB): BYPASS GRAFT CAROTID-SUBCLAVIAN (Left)  Patient Location: PACU  Anesthesia Type: General  Level of Consciousness: awake, alert , oriented and patient cooperative  Airway & Oxygen Therapy: Patient Spontanous Breathing and Patient connected to face mask oxygen  Post-op Assessment: Report given to PACU RN, Post -op Vital signs reviewed and stable, Patient moving all extremities X 4 and Patient able to stick tongue midline  Post vital signs: Reviewed and stable  Complications: No apparent anesthesia complications

## 2011-12-15 NOTE — Progress Notes (Signed)
Utilization review completed.  

## 2011-12-15 NOTE — Anesthesia Postprocedure Evaluation (Signed)
  Anesthesia Post-op Note  Patient: Deanna [name]  Procedure(s) Performed: Procedure(s) (LRB): BYPASS GRAFT CAROTID-SUBCLAVIAN (Left)  Patient Location: PACU  Anesthesia Type: General  Level of Consciousness: awake, alert  and oriented  Airway and Oxygen Therapy: Patient Spontanous Breathing and Patient connected to nasal cannula oxygen  Post-op Pain: mild  Post-op Assessment: Post-op Vital signs reviewed  Post-op Vital Signs: Reviewed  Complications: No apparent anesthesia complications

## 2011-12-15 NOTE — Consult Note (Signed)
Antibiotic Review  Patient is a 71 y/o female s/p Bypass Graft Carotid-Subclavian due to subclavean steal syndrome. Post-op, patient has been placed on Zinacef 1.5 gm IV q 12 hours x 24 hours  Ht:  62 inches  Wt  93 kg  Estimated Creatinine Clearance: 69.5 ml/min (by C-G formula based on Cr of 0.73).  Assessment/Plan Current antibiotic requires not dosage adjustment. Continue Zinacef as ordered.  Ferlando Lia, Craig Guess, Pharm.D. 12/15/2011 3:35 PM

## 2011-12-15 NOTE — Interval H&P Note (Signed)
History and Physical Interval Note:  12/15/2011 7:26 AM  Deanna [name]  has presented today for surgery, with the diagnosis of subclavian steal syndrome  The various methods of treatment have been discussed with the patient and family. After consideration of risks, benefits and other options for treatment, the patient has consented to  Procedure(s) (LRB): BYPASS GRAFT CAROTID-SUBCLAVIAN (Left) as a surgical intervention .  The patients' history has been reviewed, patient examined, no change in status, stable for surgery.  I have reviewed the patients' chart and labs.  Questions were answered to the patient's satisfaction.     Oaklyn Mans IV, V. WELLS
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
## 2011-12-16 LAB — CBC
MCHC: 32.8 g/dL (ref 30.0–36.0)
Platelets: 148 10*3/uL — ABNORMAL LOW (ref 150–400)
RDW: 15.2 % (ref 11.5–15.5)

## 2011-12-16 LAB — BASIC METABOLIC PANEL
Calcium: 8.6 mg/dL (ref 8.4–10.5)
GFR calc Af Amer: >90 mL/min (ref >90)
GFR calc non Af Amer: >90 mL/min (ref >90)
Potassium: 3.8 mEq/L (ref 3.5–5.1)
Sodium: 137 mEq/L (ref 135–145)

## 2011-12-16 MED ORDER — OXYCODONE-ACETAMINOPHEN 5-325 MG PO TABS: 1.0000 | ORAL_TABLET | Freq: Four times a day (QID) | ORAL | Status: AC | PRN

## 2011-12-16 NOTE — Progress Notes (Signed)
Pt d/c home via w/c with belongings and daughter, 1 tablet percocet given per pt's wishes c/o pain 3/10 in neck incision. D/c instructions explained and discussed with daugther kristi and pt.  Prescription and f/u visit also given to daughter and pt. Deanna [name]
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
## 2011-12-16 NOTE — Progress Notes (Addendum)
VASCULAR AND VEIN SURGERY POST - OP [facility] PROGRESS NOTE  Date of Surgery: 12/15/2011 Surgeon: Surgeon(s): Serafina Mitchell, MD 1 Day Post-Op left Carotid to subclavian bypass .  HPI: Deanna [name] is a 71 y.o. female who is 1 Day Post-Op left Carotid to subclavian bypass .  Patient is doing well. Pre-operative symptoms of numbness in left arm  are Improved. She states she has some residual tingling in the thumb but that hand is much warmer. Patient denies headache; Patient denies difficulty swallowing; denies weakness in upper or lower extremities; Pt. denies other symptoms of stroke or TIA.  IMAGING: Dg Chest Port 1 View  12/15/2011  *RADIOLOGY REPORT*  Clinical Data: Status post carotid bypass surgery.  PORTABLE CHEST - 1 VIEW  Comparison: CT chest 06/09/2011 and plain films of the chest 06/02/2011.  Findings: Streaky left basilar airspace disease is consistent with atelectasis.  Right lung is clear.  Heart size is normal.  No pneumothorax or pleural fluid.  The patient is status post CABG. Post-traumatic change right shoulder noted.  IMPRESSION: Left basilar subsegmental atelectasis.  No acute finding.  Original Report Authenticated By: Arvid Right. Luther Parody, M.D.    Significant Diagnostic Studies: CBC Lab Results  Component Value Date   WBC 9.5 12/16/2011   HGB 11.9* 12/16/2011   HCT 36.3 12/16/2011   MCV 82.9 12/16/2011   PLT 148* 12/16/2011    BMET    Component Value Date/Time   NA 137 12/16/2011 0400   K 3.8 12/16/2011 0400   CL 100 12/16/2011 0400   CO2 27 12/16/2011 0400   GLUCOSE 136* 12/16/2011 0400   BUN 11 12/16/2011 0400   CREATININE 0.60 12/16/2011 0400   CALCIUM 8.6 12/16/2011 0400   GFRNONAA >90 12/16/2011 0400   GFRAA >90 12/16/2011 0400    COAG Lab Results  Component Value Date   INR 1.03 12/08/2011   INR 1.03 06/02/2011   No results found for this basename: PTT      Intake/Output Summary (Last 24 hours) at 12/16/11 0734 Last data filed at 12/16/11 0600  Gross per  24 hour  Intake   4740 ml  Output   2100 ml  Net   2640 ml    Physical Exam:  BP Readings from Last 3 Encounters:  12/16/11 148/63  12/16/11 148/63  12/08/11 147/64   Temp Readings from Last 3 Encounters:  12/16/11 98.3 F (36.8 C) Oral  12/16/11 98.3 F (36.8 C) Oral  12/08/11 98.2 F (36.8 C) Tympanic   SpO2 Readings from Last 3 Encounters:  12/16/11 94%  12/16/11 94%  12/08/11 98%   Pulse Readings from Last 3 Encounters:  12/16/11 88  12/16/11 88  12/08/11 18    Pt is A&O x 3 Gait is normal Speech is fluent left Neck Wound is healing well Patient with Negative tongue deviation and Negative facial droop Pt has good and equal strength in all extremities LUE - Triphasic brachial flow; biphasic Ulnar flow, monophasic Radial flow  Assessment: Deanna [name] is a 71 y.o. female is S/P left Carotid to subclavian bypass Pt is voiding, ambulating and taking po well. Pt symptoms of left hand numbness much improved   Plan: Discharge to: Home later today or tomorrow if ambulates and voids  Follow-up in 2 weeks   [place] J [phone] 12/16/2011 7:34 AM

## 2011-12-17 NOTE — Discharge Summary (Signed)
Agree with above  [facility] [place] 

## 2011-12-19 ENCOUNTER — Inpatient Hospital Stay (HOSPITAL_COMMUNITY)
Admission: EM | Admit: 2011-12-19 | Discharge: 2011-12-29 | DRG: 987 | Disposition: A | Discharge status: Skilled Nursing Facility | Payer: Medicare Other | Attending: Surgery | Admitting: Surgery

## 2011-12-19 ENCOUNTER — Emergency Department (HOSPITAL_COMMUNITY): Payer: Medicare Other

## 2011-12-19 ENCOUNTER — Encounter (HOSPITAL_COMMUNITY): Payer: Self-pay | Admitting: Emergency Medicine

## 2011-12-19 DIAGNOSIS — Z6837 Body mass index (BMI) 37.0-37.9, adult: Secondary | ICD-10-CM

## 2011-12-19 DIAGNOSIS — Z87891 Personal history of nicotine dependence: Secondary | ICD-10-CM

## 2011-12-19 DIAGNOSIS — E039 Hypothyroidism, unspecified: Secondary | ICD-10-CM | POA: Diagnosis present

## 2011-12-19 DIAGNOSIS — I10 Essential (primary) hypertension: Secondary | ICD-10-CM | POA: Diagnosis present

## 2011-12-19 DIAGNOSIS — Z7982 Long term (current) use of aspirin: Secondary | ICD-10-CM

## 2011-12-19 DIAGNOSIS — Z860100 Personal history of colon polyps, unspecified: Secondary | ICD-10-CM

## 2011-12-19 DIAGNOSIS — I2510 Atherosclerotic heart disease of native coronary artery without angina pectoris: Secondary | ICD-10-CM | POA: Diagnosis present

## 2011-12-19 DIAGNOSIS — E66813 Obesity, class 3: Secondary | ICD-10-CM | POA: Diagnosis present

## 2011-12-19 DIAGNOSIS — I25708 Atherosclerosis of coronary artery bypass graft(s), unspecified, with other forms of angina pectoris: Secondary | ICD-10-CM | POA: Diagnosis present

## 2011-12-19 DIAGNOSIS — I214 Non-ST elevation (NSTEMI) myocardial infarction: Principal | ICD-10-CM

## 2011-12-19 DIAGNOSIS — Z800 Family history of malignant neoplasm of digestive organs: Secondary | ICD-10-CM

## 2011-12-19 DIAGNOSIS — I25810 Atherosclerosis of coronary artery bypass graft(s) without angina pectoris: Secondary | ICD-10-CM | POA: Diagnosis present

## 2011-12-19 DIAGNOSIS — Z96659 Presence of unspecified artificial knee joint: Secondary | ICD-10-CM

## 2011-12-19 DIAGNOSIS — R5381 Other malaise: Secondary | ICD-10-CM | POA: Diagnosis not present

## 2011-12-19 DIAGNOSIS — K219 Gastro-esophageal reflux disease without esophagitis: Secondary | ICD-10-CM | POA: Diagnosis present

## 2011-12-19 DIAGNOSIS — I898 Other specified noninfective disorders of lymphatic vessels and lymph nodes: Secondary | ICD-10-CM | POA: Diagnosis present

## 2011-12-19 DIAGNOSIS — I252 Old myocardial infarction: Secondary | ICD-10-CM

## 2011-12-19 DIAGNOSIS — R0602 Shortness of breath: Secondary | ICD-10-CM | POA: Diagnosis present

## 2011-12-19 DIAGNOSIS — Z7902 Long term (current) use of antithrombotics/antiplatelets: Secondary | ICD-10-CM

## 2011-12-19 DIAGNOSIS — F341 Dysthymic disorder: Secondary | ICD-10-CM | POA: Diagnosis present

## 2011-12-19 DIAGNOSIS — J90 Pleural effusion, not elsewhere classified: Secondary | ICD-10-CM | POA: Diagnosis present

## 2011-12-19 DIAGNOSIS — Z833 Family history of diabetes mellitus: Secondary | ICD-10-CM

## 2011-12-19 DIAGNOSIS — D649 Anemia, unspecified: Secondary | ICD-10-CM | POA: Diagnosis present

## 2011-12-19 DIAGNOSIS — I70209 Unspecified atherosclerosis of native arteries of extremities, unspecified extremity: Secondary | ICD-10-CM | POA: Diagnosis present

## 2011-12-19 DIAGNOSIS — J189 Pneumonia, unspecified organism: Secondary | ICD-10-CM | POA: Diagnosis present

## 2011-12-19 DIAGNOSIS — Z9861 Coronary angioplasty status: Secondary | ICD-10-CM

## 2011-12-19 DIAGNOSIS — G458 Other transient cerebral ischemic attacks and related syndromes: Secondary | ICD-10-CM | POA: Diagnosis present

## 2011-12-19 DIAGNOSIS — Z8249 Family history of ischemic heart disease and other diseases of the circulatory system: Secondary | ICD-10-CM

## 2011-12-19 DIAGNOSIS — Z79899 Other long term (current) drug therapy: Secondary | ICD-10-CM

## 2011-12-19 HISTORY — DX: Non-ST elevation (NSTEMI) myocardial infarction: I21.4

## 2011-12-19 LAB — POCT I-STAT 3, ART BLOOD GAS (G3+)
Bicarbonate: 29 mEq/L — ABNORMAL HIGH (ref 20.0–24.0)
TCO2: 30 mmol/L (ref 0–100)
pCO2 arterial: 45.6 mmHg — ABNORMAL HIGH (ref 35.0–45.0)
pH, Arterial: 7.411 — ABNORMAL HIGH (ref 7.350–7.400)
pO2, Arterial: 87 mmHg (ref 80.0–100.0)

## 2011-12-19 LAB — COMPREHENSIVE METABOLIC PANEL
Alkaline Phosphatase: 82 U/L (ref 39–117)
BUN: 8 mg/dL (ref 6–23)
Chloride: 98 mEq/L (ref 96–112)
GFR calc Af Amer: >90 mL/min (ref >90)
Glucose, Bld: 119 mg/dL — ABNORMAL HIGH (ref 70–99)
Potassium: 3.7 mEq/L (ref 3.5–5.1)
Total Bilirubin: 0.9 mg/dL (ref 0.3–1.2)
Total Protein: 6.8 g/dL (ref 6.0–8.3)

## 2011-12-19 LAB — PROTIME-INR: Prothrombin Time: 14.1 seconds (ref 11.6–15.2)

## 2011-12-19 LAB — CBC
HCT: 34.4 % — ABNORMAL LOW (ref 36.0–46.0)
Hemoglobin: 11.5 g/dL — ABNORMAL LOW (ref 12.0–15.0)
MCH: 27.2 pg (ref 26.0–34.0)
RBC: 4.23 MIL/uL (ref 3.87–5.11)

## 2011-12-19 LAB — CARDIAC PANEL(CRET KIN+CKTOT+MB+TROPI)
CK, MB: 3.1 ng/mL (ref 0.3–4.0)
Relative Index: INVALID (ref 0.0–2.5)
Total CK: 70 U/L (ref 7–177)

## 2011-12-19 LAB — DIFFERENTIAL
Eosinophils Absolute: 0.2 10*3/uL (ref 0.0–0.7)
Lymphs Abs: 1.3 10*3/uL (ref 0.7–4.0)
Monocytes Relative: 7 % (ref 3–12)
Neutrophils Relative %: 73 % (ref 43–77)

## 2011-12-19 MED ORDER — HEPARIN BOLUS VIA INFUSION
3000.0000 [IU] | Freq: Once | INTRAVENOUS | Status: AC
  Administered 2011-12-19: 3000 [IU] via INTRAVENOUS

## 2011-12-19 MED ORDER — SERTRALINE HCL 50 MG PO TABS
50.0000 mg | ORAL_TABLET | Freq: Every day | ORAL | Status: DC
  Administered 2011-12-20 – 2011-12-28 (×9): 50 mg via ORAL
  Filled 2011-12-19 (×10): qty 1

## 2011-12-19 MED ORDER — LORAZEPAM 0.5 MG PO TABS
0.5000 mg | ORAL_TABLET | Freq: Four times a day (QID) | ORAL | Status: DC
  Administered 2011-12-19 – 2011-12-21 (×5): 0.5 mg via ORAL
  Filled 2011-12-19 (×6): qty 1

## 2011-12-19 MED ORDER — SERTRALINE HCL 100 MG PO TABS
100.0000 mg | ORAL_TABLET | Freq: Every day | ORAL | Status: DC
  Administered 2011-12-20 – 2011-12-29 (×9): 100 mg via ORAL
  Filled 2011-12-19 (×5): qty 1
  Filled 2011-12-19 (×2): qty 2
  Filled 2011-12-19 (×3): qty 1

## 2011-12-19 MED ORDER — ATORVASTATIN CALCIUM 20 MG PO TABS
20.0000 mg | ORAL_TABLET | Freq: Every day | ORAL | Status: DC
  Administered 2011-12-20: 20 mg via ORAL
  Filled 2011-12-19 (×3): qty 1

## 2011-12-19 MED ORDER — SODIUM CHLORIDE 0.9 % IV SOLN: INTRAVENOUS | Status: DC

## 2011-12-19 MED ORDER — SODIUM CHLORIDE 0.9 % IV SOLN
250.0000 mL | INTRAVENOUS | Status: DC | PRN
  Administered 2011-12-20: 10 mL via INTRAVENOUS

## 2011-12-19 MED ORDER — ACETAMINOPHEN 325 MG PO TABS
650.0000 mg | ORAL_TABLET | Freq: Four times a day (QID) | ORAL | Status: DC | PRN
  Administered 2011-12-22 – 2011-12-26 (×2): 650 mg via ORAL
  Filled 2011-12-19 (×3): qty 2

## 2011-12-19 MED ORDER — LORAZEPAM 1 MG PO TABS
0.5000 mg | ORAL_TABLET | ORAL | Status: DC | PRN
  Administered 2011-12-19: 0.5 mg via ORAL
  Filled 2011-12-19: qty 1

## 2011-12-19 MED ORDER — ZOLPIDEM TARTRATE 5 MG PO TABS
5.0000 mg | ORAL_TABLET | Freq: Every evening | ORAL | Status: DC | PRN
  Administered 2011-12-21 – 2011-12-28 (×8): 5 mg via ORAL
  Filled 2011-12-19 (×8): qty 1

## 2011-12-19 MED ORDER — CLOPIDOGREL BISULFATE 75 MG PO TABS
75.0000 mg | ORAL_TABLET | Freq: Every day | ORAL | Status: DC
  Administered 2011-12-20 – 2011-12-29 (×9): 75 mg via ORAL
  Filled 2011-12-19 (×9): qty 1

## 2011-12-19 MED ORDER — OXYCODONE-ACETAMINOPHEN 5-325 MG PO TABS
1.0000 | ORAL_TABLET | Freq: Four times a day (QID) | ORAL | Status: DC | PRN
  Administered 2011-12-23 – 2011-12-28 (×6): 1 via ORAL
  Filled 2011-12-19 (×7): qty 1

## 2011-12-19 MED ORDER — SODIUM CHLORIDE 0.9 % IJ SOLN: 3.0000 mL | Freq: Two times a day (BID) | INTRAMUSCULAR | Status: DC

## 2011-12-19 MED ORDER — LEVOTHYROXINE SODIUM 175 MCG PO TABS
175.0000 ug | ORAL_TABLET | Freq: Every day | ORAL | Status: DC
  Administered 2011-12-20 – 2011-12-29 (×9): 175 ug via ORAL
  Filled 2011-12-19 (×10): qty 1

## 2011-12-19 MED ORDER — ASPIRIN 81 MG PO CHEW
324.0000 mg | CHEWABLE_TABLET | Freq: Once | ORAL | Status: AC
  Administered 2011-12-19: 324 mg via ORAL
  Filled 2011-12-19: qty 4

## 2011-12-19 MED ORDER — PANTOPRAZOLE SODIUM 40 MG IV SOLR
40.0000 mg | Freq: Once | INTRAVENOUS | Status: AC
  Administered 2011-12-19: 40 mg via INTRAVENOUS
  Filled 2011-12-19: qty 40

## 2011-12-19 MED ORDER — VANCOMYCIN HCL IN DEXTROSE 1-5 GM/200ML-% IV SOLN
1000.0000 mg | Freq: Once | INTRAVENOUS | Status: AC
  Administered 2011-12-19: 1000 mg via INTRAVENOUS
  Filled 2011-12-19: qty 200

## 2011-12-19 MED ORDER — SODIUM CHLORIDE 0.9 % IJ SOLN: 3.0000 mL | INTRAMUSCULAR | Status: DC | PRN

## 2011-12-19 MED ORDER — AMLODIPINE BESYLATE 5 MG PO TABS
5.0000 mg | ORAL_TABLET | Freq: Every day | ORAL | Status: DC
  Administered 2011-12-20 – 2011-12-29 (×9): 5 mg via ORAL
  Filled 2011-12-19 (×10): qty 1

## 2011-12-19 MED ORDER — BISACODYL 10 MG RE SUPP
10.0000 mg | Freq: Every day | RECTAL | Status: DC | PRN
  Filled 2011-12-19: qty 1

## 2011-12-19 MED ORDER — IRBESARTAN 75 MG PO TABS
75.0000 mg | ORAL_TABLET | Freq: Every day | ORAL | Status: DC
  Administered 2011-12-20 – 2011-12-21 (×2): 75 mg via ORAL
  Filled 2011-12-19 (×2): qty 1

## 2011-12-19 MED ORDER — ACETAMINOPHEN 650 MG RE SUPP: 650.0000 mg | Freq: Four times a day (QID) | RECTAL | Status: DC | PRN

## 2011-12-19 MED ORDER — SODIUM CHLORIDE 0.9 % IV BOLUS (SEPSIS)
500.0000 mL | Freq: Once | INTRAVENOUS | Status: AC
  Administered 2011-12-19: 500 mL via INTRAVENOUS

## 2011-12-19 MED ORDER — ALUM & MAG HYDROXIDE-SIMETH 200-200-20 MG/5ML PO SUSP: 30.0000 mL | Freq: Four times a day (QID) | ORAL | Status: DC | PRN

## 2011-12-19 MED ORDER — ASPIRIN 81 MG PO CHEW
81.0000 mg | CHEWABLE_TABLET | Freq: Every day | ORAL | Status: DC
  Filled 2011-12-19 (×2): qty 1
  Filled 2011-12-19: qty 4

## 2011-12-19 MED ORDER — METOPROLOL TARTRATE 50 MG PO TABS
50.0000 mg | ORAL_TABLET | Freq: Two times a day (BID) | ORAL | Status: DC
  Administered 2011-12-20 – 2011-12-29 (×18): 50 mg via ORAL
  Filled 2011-12-19 (×21): qty 1

## 2011-12-19 MED ORDER — VITAMIN B-12 1000 MCG PO TABS
1000.0000 ug | ORAL_TABLET | Freq: Every day | ORAL | Status: DC
  Administered 2011-12-20 – 2011-12-29 (×9): 1000 ug via ORAL
  Filled 2011-12-19 (×10): qty 1

## 2011-12-19 MED ORDER — SODIUM CHLORIDE 0.9 % IJ SOLN
3.0000 mL | Freq: Two times a day (BID) | INTRAMUSCULAR | Status: DC
  Administered 2011-12-20 – 2011-12-21 (×2): 3 mL via INTRAVENOUS

## 2011-12-19 MED ORDER — HEPARIN (PORCINE) IN NACL 100-0.45 UNIT/ML-% IJ SOLN
1200.0000 [IU]/h | INTRAMUSCULAR | Status: DC
  Administered 2011-12-19: 850 [IU]/h via INTRAVENOUS
  Filled 2011-12-19 (×2): qty 250

## 2011-12-19 MED ORDER — PIPERACILLIN-TAZOBACTAM 3.375 G IVPB
3.3750 g | Freq: Once | INTRAVENOUS | Status: AC
  Administered 2011-12-19: 3.375 g via INTRAVENOUS
  Filled 2011-12-19: qty 50

## 2011-12-19 MED ORDER — LEVOFLOXACIN IN D5W 500 MG/100ML IV SOLN
500.0000 mg | INTRAVENOUS | Status: AC
Start: 2011-12-19 — End: 2011-12-19
  Administered 2011-12-19: 500 mg via INTRAVENOUS
  Filled 2011-12-19: qty 100

## 2011-12-19 MED ORDER — PROMETHAZINE HCL 12.5 MG PO TABS: 12.5000 mg | ORAL_TABLET | Freq: Four times a day (QID) | ORAL | Status: DC | PRN

## 2011-12-19 MED ORDER — SENNOSIDES-DOCUSATE SODIUM 8.6-50 MG PO TABS
1.0000 | ORAL_TABLET | Freq: Every evening | ORAL | Status: DC | PRN
  Filled 2011-12-19: qty 1

## 2011-12-19 MED ORDER — PANTOPRAZOLE SODIUM 40 MG PO TBEC
40.0000 mg | DELAYED_RELEASE_TABLET | Freq: Every day | ORAL | Status: DC
  Administered 2011-12-20 – 2011-12-29 (×9): 40 mg via ORAL
  Filled 2011-12-19 (×9): qty 1

## 2011-12-19 MED ORDER — NITROGLYCERIN IN D5W 200-5 MCG/ML-% IV SOLN
2.0000 ug/min | INTRAVENOUS | Status: DC
  Administered 2011-12-19: 16.667 ug/min via INTRAVENOUS
  Administered 2011-12-21: 10 ug/min via INTRAVENOUS
  Filled 2011-12-19 (×2): qty 250

## 2011-12-19 NOTE — ED Provider Notes (Signed)
History     CSN: [id number]  Arrival date & time 12/19/11  [id number]   First MD Initiated Contact with Patient 12/19/11 1007      Chief Complaint  Patient presents with  . Shortness of Breath  . Chest Pain    (Consider location/radiation/quality/duration/timing/severity/associated sxs/prior treatment) HPI Pt with recent carotid to suclavian bypass for subclavian steel syndrom 5/9 and has low grade chest pressure since. D/C from hospital yesterday. Pt returns with increased SOB and mild cough with yellow sputum production. Continues to have mild chest pressure. No leg swelling or pain. Pt states surgical site has increasing redness and swelling/tenderness. No fever or chills Past Medical History  Diagnosis Date  . Hypertension   . Colon polyps   . Hemorrhoids   . Hypothyroidism   . GERD (gastroesophageal reflux disease)   . Anxiety   . Depression   . Myocardial infarction   . CAD (coronary artery disease)     followed by dr [name].  . Sinus drainage     took z-pack   finished yesterday    Past Surgical History  Procedure Date  . Fem-fem bypass graft   . Coronary artery bypass graft   . 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 Coronary angioplasty   . Joint replacement     Family History  Problem Relation Age of Onset  . Colon cancer Brother   . Heart attack Brother   . Hyperlipidemia Brother   . Hypertension Brother   . Heart disease Brother   . Heart attack Mother   . Diabetes Father   . Heart disease Father   . Hypertension Father   . Hyperlipidemia Father   . Heart attack Father   . Diabetes Sister   . Heart disease Sister   . Hyperlipidemia Sister   . Hypertension Sister   . Heart attack Sister     History  Substance Use Topics  . Smoking status: Former Smoker    Types: Cigarettes    Quit date: 11/27/1981  . Smokeless tobacco: Never Used  . Alcohol Use: No    OB History    Grav Para Term Preterm Abortions TAB SAB Ect Mult Living                   Review of Systems  Constitutional: Negative for fever and chills.  Respiratory: Positive for cough and shortness of breath. Negative for wheezing.   Cardiovascular: Positive for chest pain. Negative for palpitations and leg swelling.  Gastrointestinal: Negative for nausea, vomiting, abdominal pain and diarrhea.  Genitourinary: Negative for flank pain.  Musculoskeletal: Negative for myalgias and back pain.  Skin: Positive for wound.  Neurological: Negative for dizziness, weakness, light-headedness, numbness and headaches.    Allergies  Vicodin  Home Medications   Current Outpatient Rx  Name Route Sig Dispense Refill  . AMLODIPINE BESYLATE 5 MG PO TABS Oral Take 5 mg by mouth daily.    . ASPIRIN 81 MG PO TABS Oral Take 81 mg by mouth daily.     Marland Kitchen VITAMIN D-3 PO Oral Take 1 tablet by mouth daily.    Marland Kitchen CLOPIDOGREL BISULFATE 75 MG PO TABS Oral Take 75 mg by mouth daily.    Marland Kitchen LEVOTHYROXINE SODIUM 175 MCG PO TABS Oral Take 175 mcg by mouth daily.    Marland Kitchen LORAZEPAM 1 MG PO TABS Oral Take 0.5 mg by mouth as needed. For aniexty    . MECLIZINE HCL 12.5 MG PO TABS Oral Take 12.5 mg by  mouth 3 (three) times daily as needed. For dizziness    . METOPROLOL TARTRATE 50 MG PO TABS Oral Take 50 mg by mouth 2 (two) times daily.    Marland Kitchen OMEPRAZOLE 20 MG PO CPDR Oral Take 20 mg by mouth daily.    . OXYCODONE-ACETAMINOPHEN 5-325 MG PO TABS Oral Take 1 tablet by mouth every 6 (six) hours as needed. 30 tablet 0  . ROSUVASTATIN CALCIUM 10 MG PO TABS Oral Take 10 mg by mouth daily.    . SERTRALINE HCL 100 MG PO TABS Oral Take 50-100 mg by mouth 2 (two) times daily. Take 1 tablet in the morning and 1/2 a tablet in the evening    . VALSARTAN 80 MG PO TABS Oral Take 80 mg by mouth daily.    Marland Kitchen VITAMIN B-12 1000 MCG PO TABS Oral Take 1,000 mcg by mouth daily.      BP 145/74  Pulse 91  Temp(Src) 98.2 F (36.8 C) (Oral)  Resp 18  SpO2 99%  Physical Exam  Nursing note and vitals reviewed. Constitutional: She  is oriented to person, place, and time. She appears well-developed and well-nourished. No distress.  HENT:  Head: Normocephalic and atraumatic.  Mouth/Throat: Oropharynx is clear and moist.  Eyes: EOM are normal. Pupils are equal, round, and reactive to light.  Neck: Normal range of motion. Neck supple.       L anterior post op incision with induration, swelling and redness. +TTP  Cardiovascular: Normal rate and regular rhythm.   Pulmonary/Chest: Effort normal and breath sounds normal. No respiratory distress. She has no wheezes. She has no rales.  Abdominal: Soft. Bowel sounds are normal. She exhibits no mass. There is no tenderness. There is no rebound and no guarding.  Musculoskeletal: Normal range of motion. She exhibits no edema and no tenderness.       No calf tenderness or swelling  Neurological: She is alert and oriented to person, place, and time.  Skin: Skin is warm and dry. No rash noted. No erythema.  Psychiatric: She has a normal mood and affect. Her behavior is normal.    ED Course  Procedures (including critical care time)  Labs Reviewed  CBC - Abnormal; Notable for the following:    Hemoglobin 11.5 (*)    HCT 34.4 (*)    All other components within normal limits  COMPREHENSIVE METABOLIC PANEL - Abnormal; Notable for the following:    Glucose, Bld 119 (*)    Albumin 3.3 (*)    All other components within normal limits  CARDIAC PANEL(CRET KIN+CKTOT+MB+TROPI) - Abnormal; Notable for the following:    Troponin I 1.89 (*)    All other components within normal limits  POCT I-STAT 3, BLOOD GAS (G3+) - Abnormal; Notable for the following:    pH, Arterial 7.411 (*)    pCO2 arterial 45.6 (*)    Bicarbonate 29.0 (*)    Acid-Base Excess 4.0 (*)    All other components within normal limits  DIFFERENTIAL  PROTIME-INR  APTT  HEPARIN LEVEL (UNFRACTIONATED)  CARDIAC PANEL(CRET KIN+CKTOT+MB+TROPI)  CARDIAC PANEL(CRET KIN+CKTOT+MB+TROPI)   Dg Chest 2 View  12/19/2011   *RADIOLOGY REPORT*  Clinical Data:  Cough and shortness of breath.  CHEST - 2 VIEW  Comparison: Plain film chest 12/15/2011 and 06/02/2011.  CT chest 06/09/2011.  Findings: The patient has a left pleural effusion and basilar airspace disease.  Right lung is clear.  No pneumothorax.  Heart size normal.  Post-traumatic change right shoulder noted.  IMPRESSION:  Left effusion and basilar airspace disease compatible with pneumonia.  Recommend follow-up films to clearing.  Original Report Authenticated By: Arvid Right. D'ALESSIO, M.D.     1. Healthcare-associated pneumonia   2. NSTEMI (non-ST elevated myocardial infarction)      Date: 12/19/2011  Rate: 88  Rhythm: normal sinus rhythm  QRS Axis: normal  Intervals: normal  ST/T Wave abnormalities: nonspecific T wave changes  Conduction Disutrbances:none  Narrative Interpretation:   Old EKG Reviewed: changes noted    MDM  Discussed with cardiology, Dr Ellyn Hack. Will see pt in ED. Asked to have hospitalist admit  Dr Philip Aspen will admit. States he will arrive after clinic hours.   Julianne Rice, MD 12/19/11 [phone]

## 2011-12-19 NOTE — Consult Note (Signed)
Reason for Consult: elevated troponin Referring Physician:   TEKESHA WERY is an 71 y.o. female.  HPI:   The patient is a 71 year old obese Caucasian female was recently diagnosed with subclavian steal syndrome underwent PV angiogram and subsequent left carotid to subclavian bypass surgery on 12/15/2011.  Patient also has a history of coronary artery disease and remote coronary artery bypass grafting in 1999.  Her last left heart catheterization was in July 2002 which showed an atretic LIMA to the LAD is patent patent SVG to the diagonal, 90% LAD beyond the diagonal, patent SVG to circumflex, patent SVG to the right coronary artery. No significant disease beyond the grafts. Her last Myoview stress test was February of 2001 which was negative for ischemia and considered low risk. Her last echocardiogram was October 2011 which showed an ejection fraction of 55% and no wall wall motion abnormalities and no significant valve disease. She's also had left to right fem-fem bypass graft by Dr. Arlyce Dice in the 80s. Her history also includes chronic anxiety, gastroesophageal reflux disease, hypothyroidism, hypertension.  Follow pleurectomy Dopplers in February 2011 to right ABI of 0.94 and a left ABI of 0.86. She had totally knee replacement by Dr. Wynelle Link October 2012.  The patient and her daughter provides history. He stated that on Friday night the patient developed weakness and dizziness as well as fever. She was given ibuprofen by her daughter and that seemed to clear up the fever. Just reports that prior to surgery she was given a Z-Pak which he finished on Wednesday of last week. She's also complaining of cough with brown sputum production as well as decreased appetite, chest pain, shortness of breath. The chest pain radiates to her back and she describes it as a tightness. She also reports being anxious and having some abdominal pain after eating. She denies nausea, vomiting, diaphoresis, but claims a clammy  feeling on Friday night. She also denies current abdominal pain, dysuria, hematuria, hematochezia, melena.   She does currently complain of chest tightness.  EKG shows new 'Q' waves inferiorly and TWI in leads V4-6, III, aVF.  Troponin is elevated at 1.86.  Past Medical History  Diagnosis Date  . Hypertension   . Colon polyps   . Hemorrhoids   . Hypothyroidism   . GERD (gastroesophageal reflux disease)   . Anxiety   . Depression   . Myocardial infarction   . CAD (coronary artery disease)     followed by dr [name].  . Sinus drainage     took z-pack   finished yesterday    Past Surgical History  Procedure Date  . Fem-fem bypass graft   . Coronary artery bypass graft   . 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 Coronary angioplasty   . Joint replacement     Family History  Problem Relation Age of Onset  . Colon cancer Brother   . Heart attack Brother   . Hyperlipidemia Brother   . Hypertension Brother   . Heart disease Brother   . Heart attack Mother   . Diabetes Father   . Heart disease Father   . Hypertension Father   . Hyperlipidemia Father   . Heart attack Father   . Diabetes Sister   . Heart disease Sister   . Hyperlipidemia Sister   . Hypertension Sister   . Heart attack Sister     Social History:  reports that she quit smoking about 30 years ago. Her smoking use included Cigarettes. She has never used smokeless  tobacco. She reports that she does not drink alcohol or use illicit drugs.  Allergies:  Allergies  Allergen Reactions  . Vicodin (Hydrocodone-Acetaminophen) Other (See Comments)    unknown    Medications:   Results for orders placed during the hospital encounter of 12/19/11 (from the past 48 hour(s))  CBC     Status: Abnormal   Collection Time   12/19/11 11:33 AM      Component Value Range Comment   WBC 7.8  4.0 - 10.5 (K/uL)    RBC 4.23  3.87 - 5.11 (MIL/uL)    Hemoglobin 11.5 (*) 12.0 - 15.0 (g/dL)    HCT 34.4 (*) 36.0 - 46.0 (%)    MCV 81.3   78.0 - 100.0 (fL)    MCH 27.2  26.0 - 34.0 (pg)    MCHC 33.4  30.0 - 36.0 (g/dL)    RDW 14.3  11.5 - 15.5 (%)    Platelets 151  150 - 400 (K/uL)   DIFFERENTIAL     Status: Normal   Collection Time   12/19/11 11:33 AM      Component Value Range Comment   Neutrophils Relative 73  43 - 77 (%)    Neutro Abs 5.7  1.7 - 7.7 (K/uL)    Lymphocytes Relative 17  12 - 46 (%)    Lymphs Abs 1.3  0.7 - 4.0 (K/uL)    Monocytes Relative 7  3 - 12 (%)    Monocytes Absolute 0.6  0.1 - 1.0 (K/uL)    Eosinophils Relative 2  0 - 5 (%)    Eosinophils Absolute 0.2  0.0 - 0.7 (K/uL)    Basophils Relative 0  0 - 1 (%)    Basophils Absolute 0.0  0.0 - 0.1 (K/uL)   COMPREHENSIVE METABOLIC PANEL     Status: Abnormal   Collection Time   12/19/11 11:33 AM      Component Value Range Comment   Sodium 135  135 - 145 (mEq/L)    Potassium 3.7  3.5 - 5.1 (mEq/L)    Chloride 98  96 - 112 (mEq/L)    CO2 27  19 - 32 (mEq/L)    Glucose, Bld 119 (*) 70 - 99 (mg/dL)    BUN 8  6 - 23 (mg/dL)    Creatinine, Ser 0.52  0.50 - 1.10 (mg/dL)    Calcium 9.2  8.4 - 10.5 (mg/dL)    Total Protein 6.8  6.0 - 8.3 (g/dL)    Albumin 3.3 (*) 3.5 - 5.2 (g/dL)    AST 17  0 - 37 (U/L)    ALT 11  0 - 35 (U/L)    Alkaline Phosphatase 82  39 - 117 (U/L)    Total Bilirubin 0.9  0.3 - 1.2 (mg/dL)    GFR calc non Af Amer >90  >90 (mL/min)    GFR calc Af Amer >90  >90 (mL/min)   PROTIME-INR     Status: Normal   Collection Time   12/19/11 11:33 AM      Component Value Range Comment   Prothrombin Time 14.1  11.6 - 15.2 (seconds)    INR 1.07  0.00 - 1.49    APTT     Status: Normal   Collection Time   12/19/11 11:33 AM      Component Value Range Comment   aPTT 33  24 - 37 (seconds)   CARDIAC PANEL(CRET KIN+CKTOT+MB+TROPI)     Status: Abnormal   Collection Time  12/19/11 11:33 AM      Component Value Range Comment   Total CK 74  7 - 177 (U/L)    CK, MB 3.1  0.3 - 4.0 (ng/mL)    Troponin I 1.89 (*) <0.30 (ng/mL)    Relative Index  RELATIVE INDEX IS INVALID  0.0 - 2.5    POCT I-STAT 3, BLOOD GAS (G3+)     Status: Abnormal   Collection Time   12/19/11 12:25 PM      Component Value Range Comment   pH, Arterial 7.411 (*) 7.350 - 7.400     pCO2 arterial 45.6 (*) 35.0 - 45.0 (mmHg)    pO2, Arterial 87.0  80.0 - 100.0 (mmHg)    Bicarbonate 29.0 (*) 20.0 - 24.0 (mEq/L)    TCO2 30  0 - 100 (mmol/L)    O2 Saturation 97.0      Acid-Base Excess 4.0 (*) 0.0 - 2.0 (mmol/L)    Collection site RADIAL, ALLEN'S TEST ACCEPTABLE      Drawn by RT      Sample type ARTERIAL       Dg Chest 2 View  12/19/2011  *RADIOLOGY REPORT*  Clinical Data:  Cough and shortness of breath.  CHEST - 2 VIEW  Comparison: Plain film chest 12/15/2011 and 06/02/2011.  CT chest 06/09/2011.  Findings: The patient has a left pleural effusion and basilar airspace disease.  Right lung is clear.  No pneumothorax.  Heart size normal.  Post-traumatic change right shoulder noted.  IMPRESSION: Left effusion and basilar airspace disease compatible with pneumonia.  Recommend follow-up films to clearing.  Original Report Authenticated By: Arvid Right. Luther Parody, M.D.    Review of Systems  Constitutional: Positive for fever (resolved) and diaphoresis (resolved).  HENT: Positive for congestion and neck pain. Negative for sore throat.   Eyes: Negative for double vision.  Respiratory: Positive for cough, sputum production and shortness of breath.   Cardiovascular: Positive for chest pain. Negative for palpitations, orthopnea, leg swelling and PND.  Gastrointestinal: Negative for nausea, vomiting, diarrhea, constipation, blood in stool and melena.  Genitourinary: Negative for dysuria and hematuria.  Musculoskeletal: Positive for back pain.  Neurological: Positive for dizziness and weakness.  Psychiatric/Behavioral: Negative for depression.   Blood pressure 145/74, pulse 91, temperature 98.2 F (36.8 C), temperature source Oral, resp. rate 18, SpO2 99.00%. Physical Exam    Constitutional: She is oriented to person, place, and time. No distress.       Morbidly obese and deconditioned.   HENT:  Head: Normocephalic and atraumatic.  Mouth/Throat: Oropharynx is clear and moist. No oropharyngeal exudate.  Eyes: EOM are normal. Pupils are equal, round, and reactive to light. No scleral icterus.  Neck:       Left side of neck(subclavian region)  Edematous, erhythema.  Cardiovascular: Normal rate and regular rhythm.   Pulses:      Radial pulses are 2+ on the right side, and 1+ on the left side.       Dorsalis pedis pulses are 1+ on the right side, and 1+ on the left side.       Very soft systolic MM  Respiratory: Effort normal. She has no wheezes. She has no rales.       Decreased BS at left base.  Dull at left base.  GI: Soft. Bowel sounds are normal. There is no tenderness.  Musculoskeletal: She exhibits no edema.  Neurological: She is alert and oriented to person, place, and time. She exhibits normal muscle tone.  Skin: Skin is warm and dry.       Left side of neck(subclavian region)  Edematous, errhythema, warm  Psychiatric: She has a normal mood and affect.    Assessment/Plan: Patient Active Hospital Problem List:  NSTEMI(12/19/11) 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, 2011 (11/28/2011) Pleural effusion, left (12/19/2011) CAD (11/17/2008) CABG 1995 HYPOTHYROIDISM (11/17/2008) OBESITY, MODERATE (11/17/2008) ANXIETY DEPRESSION (11/17/2008) HYPERTENSION (11/17/2008) GERD (11/17/2008)  Plan:  EKG shows new 'Q' waves inferiorly and TWI in leads V4-6, III, aVF.  Troponin is elevated at 1.86. The patient is receiving Vancomycin currently in the ER.  She will be admitted by the Hospitalist. We will cycle cardiac markers.  Start IV heparin and nitroglycerin.  Recommend blood cultures.  Her incision site is erythematous and warm.  She will need a left heart cath at some point.   Armanda Forand W 12/19/2011, 1:40 PM

## 2011-12-19 NOTE — ED Notes (Addendum)
Patient returned from xray. Patient remains on monitor and 2L oxygen with NAD at this time.

## 2011-12-19 NOTE — Progress Notes (Signed)
ANTICOAGULATION CONSULT NOTE - Initial Consult  Pharmacy Consult for Heparin Indication: chest pain/ACS  Allergies  Allergen Reactions  . Vicodin (Hydrocodone-Acetaminophen) Other (See Comments)    unknown    Patient Measurements:   Ht: 62 inches Wt: 93 kg Heparin Dosing Weight: 72 kg  Vital Signs: Temp: 98.2 F (36.8 C) (05/13 1222) Temp src: Oral (05/13 1222) BP: 145/74 mmHg (05/13 1222) Pulse Rate: 91  (05/13 1222)  Labs:  Basename 12/19/11 1133  HGB 11.5*  HCT 34.4*  PLT 151  APTT 33  LABPROT 14.1  INR 1.07  HEPARINUNFRC --  CREATININE 0.52  CKTOTAL 74  CKMB 3.1  TROPONINI 1.89*    CrCl ~ 75 ml/min   Medical History: Past Medical History  Diagnosis Date  . Hypertension   . Colon polyps   . Hemorrhoids   . Hypothyroidism   . GERD (gastroesophageal reflux disease)   . Anxiety   . Depression   . Myocardial infarction   . CAD (coronary artery disease)     followed by dr [name].  . Sinus drainage     took z-pack   finished yesterday    Medications:    Assessment: 71 yo F recently diagnosed with subclavian steal syndrome s/p PV angio and left carotid to subclavian bypass surgery 12/15/11.  Pt presents to ER 12/19/2011 with CP/tightness that radiates to back and SOB.  Elevated trop.  PMH: CAD s/p CABG, PVD s/p fem-pop bypass, chronic anxiety, GERD, hypothyroidism, HTN, arthritis s/p TKR  Goal of Therapy:  Heparin level 0.3-0.7 units/ml Monitor platelets by anticoagulation protocol: Yes   Plan:  Heparin 3000 unit IV bolus x 1. Heparin infusion at 850 units/hr. Heparin level 8 hours after infusion started. Heparin level and CBC daily while on heparin.   [employer], Pharm.D., BCPS Clinical Pharmacist Pager [phone] 12/19/2011 1:57 PM

## 2011-12-19 NOTE — ED Notes (Signed)
Cardiologist from [facility] advised patiient's nitro drip at 20 and was instructed to stop increasing nitro and try ativan .5mg  to help with chest pressure.

## 2011-12-19 NOTE — ED Notes (Signed)
Cardiologist at bedside.  

## 2011-12-19 NOTE — ED Notes (Signed)
Patient states she woke this morning with nausea and tightness in her chest and SOB. Patient states she was discharged from hospital yesterday after having left side carotid artery surgery. Surgical area is appears red and swollen. Patient placed on monitor and 2L oxygen with sats of 100%.

## 2011-12-19 NOTE — H&P (Signed)
Deanna [name] is an 71 y.o. female.   Chief Complaint: shortness of breath and chest pain HPI:    Patient is a 71 year old woman who is well-known to me. She has a medical history that is most significant for hypertension, coronary artery disease with myocardial infarction, gastroesophageal reflux disease, and atherosclerotic peripheral vascular disease. 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 She did well with the surgery was discharged home the next day. Earlier on the morning of May 11, she awoke with substernal chest pain and nausea and vomiting. This persisted for several hours, and she did not seek medical attention. Over the weekend she has had intermittent nausea with mild substernal chest discomfort and persistent mild shortness of breath. She presented to the emergency room today for evaluation of these persistent symptoms. She has had gradually reduced intake of food and fluids because of nausea, and she denies diarrhea, constipation, productive cough, fever, or chills. She has had some edema and erythema along the left neck and clavicular area surgical site. The area is not tender, however. She was seen earlier today by cardiology consultant who noted that she had an elevated troponin I level with normal total CPK level, and it was felt that she may have had a non- ST segment elevation myocardial infarction recently. Further testing reveals a chest x-ray with small left pleural effusion and possible left basilar pneumonia. I was asked to admit this postoperative patient as an the major issue appeared to be new pneumonia. At the time of my evaluation she was having intensity 2/10  substernal chest pain, despite ongoing treatment with nitroglycerin IV and nasal cannula oxygen. She is not having significant nausea or vomiting or shortness of breath. She has a known history of gastroesophageal reflux as well.  Past Medical  History  Diagnosis Date  . Hypertension   . Colon polyps   . Hemorrhoids   . Hypothyroidism   . GERD (gastroesophageal reflux disease)   . Anxiety   . Depression   . Myocardial infarction   . CAD (coronary artery disease)     followed by dr [name].  . Sinus drainage     took z-pack   finished yesterday     (Not in a hospital admission)  ADDITIONAL HOME MEDICATIONS: See list of medications at the time of hospital discharge  PHYSICIANS INVOLVED IN CARE: Drs Bruna Potter, Cyndia Bent, Aluisio, and [facility]  Past Surgical History  Procedure Date  . Fem-fem bypass graft   . Coronary artery bypass graft   . 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 Coronary angioplasty   . Joint replacement   . Carotid-subclavian bypass graft 12/15/2011    Procedure: BYPASS GRAFT CAROTID-SUBCLAVIAN;  Surgeon: Serafina Mitchell, MD;  Location: [facility] OR;  Service: Vascular;  Laterality: Left;  Left Carotid subclavian bypass    Family History  Problem Relation Age of Onset  . Colon cancer Brother   . Heart attack Brother   . Hyperlipidemia Brother   . Hypertension Brother   . Heart disease Brother   . Heart attack Mother   . Diabetes Father   . Heart disease Father   . Hypertension Father   . Hyperlipidemia Father   . Heart attack Father   . Diabetes Sister   . Heart disease Sister   . Hyperlipidemia Sister   . Hypertension Sister   . Heart attack Sister      Social History:  reports that she quit smoking about 30 years ago. Her smoking use included Cigarettes. She has never used smokeless tobacco. She reports that she does not drink alcohol or use illicit drugs.  Allergies:  Allergies  Allergen Reactions  . Vicodin (Hydrocodone-Acetaminophen) Other (See Comments)    unknown     ROS: anemia, arthritis, heart attack, Heart disease, high blood pressure, shortness of breath, thyroid disease and Hypertension, depression, anxiety, hyperlipidemia, atherosclerotic peripheral vascular disease,  mild mitral regurgitation, colon polyps, 1999 coronary artery bypass graft surgery  PHYSICAL EXAM: Blood pressure 155/86, pulse 103, temperature 98.5 F (36.9 C), temperature source Oral, resp. rate 18, SpO2 98.00%. In general, she is a mildly overweight white woman who had 2/10 intensity substernal chest pain on oxygen while sitting upright. HEENT exam was within normal limits, neck was supple and had bilateral soft carotid bruits, there was a left infraclavicular surgical incision line that was clean, dry, and intact and in this region there was erythema with mild induration and no fluctuance. Chest had decreased breath sounds in the lower one third of the left hemithorax, heart had a regular rate and rhythm without significant murmur or gallop, abdomen had normal bowel sounds no hepatosplenomegaly or tenderness, extremities were without cyanosis, clubbing, or edema. Neurological exam was nonfocal.  Results for orders placed during the hospital encounter of 12/19/11 (from the past 48 hour(s))  CBC     Status: Abnormal   Collection Time   12/19/11 11:33 AM      Component Value Range Comment   WBC 7.8  4.0 - 10.5 (K/uL)    RBC 4.23  3.87 - 5.11 (MIL/uL)    Hemoglobin 11.5 (*) 12.0 - 15.0 (g/dL)    HCT 34.4 (*) 36.0 - 46.0 (%)    MCV 81.3  78.0 - 100.0 (fL)    MCH 27.2  26.0 - 34.0 (pg)    MCHC 33.4  30.0 - 36.0 (g/dL)    RDW 14.3  11.5 - 15.5 (%)    Platelets 151  150 - 400 (K/uL)   DIFFERENTIAL     Status: Normal   Collection Time   12/19/11 11:33 AM      Component Value Range Comment   Neutrophils Relative 73  43 - 77 (%)    Neutro Abs 5.7  1.7 - 7.7 (K/uL)    Lymphocytes Relative 17  12 - 46 (%)    Lymphs Abs 1.3  0.7 - 4.0 (K/uL)    Monocytes Relative 7  3 - 12 (%)    Monocytes Absolute 0.6  0.1 - 1.0 (K/uL)    Eosinophils Relative 2  0 - 5 (%)    Eosinophils Absolute 0.2  0.0 - 0.7 (K/uL)    Basophils Relative 0  0 - 1 (%)    Basophils Absolute 0.0  0.0 - 0.1 (K/uL)     COMPREHENSIVE METABOLIC PANEL     Status: Abnormal   Collection Time   12/19/11 11:33 AM      Component Value Range Comment   Sodium 135  135 - 145 (mEq/L)    Potassium 3.7  3.5 - 5.1 (mEq/L)    Chloride 98  96 - 112 (mEq/L)    CO2 27  19 - 32 (mEq/L)    Glucose, Bld 119 (*) 70 - 99 (mg/dL)    BUN 8  6 - 23 (mg/dL)    Creatinine, Ser 0.52  0.50 - 1.10 (mg/dL)    Calcium 9.2  8.4 - 10.5 (mg/dL)  Total Protein 6.8  6.0 - 8.3 (g/dL)    Albumin 3.3 (*) 3.5 - 5.2 (g/dL)    AST 17  0 - 37 (U/L)    ALT 11  0 - 35 (U/L)    Alkaline Phosphatase 82  39 - 117 (U/L)    Total Bilirubin 0.9  0.3 - 1.2 (mg/dL)    GFR calc non Af Amer >90  >90 (mL/min)    GFR calc Af Amer >90  >90 (mL/min)   PROTIME-INR     Status: Normal   Collection Time   12/19/11 11:33 AM      Component Value Range Comment   Prothrombin Time 14.1  11.6 - 15.2 (seconds)    INR 1.07  0.00 - 1.49    APTT     Status: Normal   Collection Time   12/19/11 11:33 AM      Component Value Range Comment   aPTT 33  24 - 37 (seconds)   CARDIAC PANEL(CRET KIN+CKTOT+MB+TROPI)     Status: Abnormal   Collection Time   12/19/11 11:33 AM      Component Value Range Comment   Total CK 74  7 - 177 (U/L)    CK, MB 3.1  0.3 - 4.0 (ng/mL)    Troponin I 1.89 (*) <0.30 (ng/mL)    Relative Index RELATIVE INDEX IS INVALID  0.0 - 2.5    POCT I-STAT 3, BLOOD GAS (G3+)     Status: Abnormal   Collection Time   12/19/11 12:25 PM      Component Value Range Comment   pH, Arterial 7.411 (*) 7.350 - 7.400     pCO2 arterial 45.6 (*) 35.0 - 45.0 (mmHg)    pO2, Arterial 87.0  80.0 - 100.0 (mmHg)    Bicarbonate 29.0 (*) 20.0 - 24.0 (mEq/L)    TCO2 30  0 - 100 (mmol/L)    O2 Saturation 97.0      Acid-Base Excess 4.0 (*) 0.0 - 2.0 (mmol/L)    Collection site RADIAL, ALLEN'S TEST ACCEPTABLE      Drawn by RT      Sample type ARTERIAL     CARDIAC PANEL(CRET KIN+CKTOT+MB+TROPI)     Status: Abnormal   Collection Time   12/19/11  2:12 PM      Component Value  Range Comment   Total CK 70  7 - 177 (U/L)    CK, MB 3.1  0.3 - 4.0 (ng/mL)    Troponin I 1.40 (*) <0.30 (ng/mL)    Relative Index RELATIVE INDEX IS INVALID  0.0 - 2.5     Dg Chest 2 View  12/19/2011  *RADIOLOGY REPORT*  Clinical Data:  Cough and shortness of breath.  CHEST - 2 VIEW  Comparison: Plain film chest 12/15/2011 and 06/02/2011.  CT chest 06/09/2011.  Findings: The patient has a left pleural effusion and basilar airspace disease.  Right lung is clear.  No pneumothorax.  Heart size normal.  Post-traumatic change right shoulder noted.  IMPRESSION: Left effusion and basilar airspace disease compatible with pneumonia.  Recommend follow-up films to clearing.  Original Report Authenticated By: Arvid Right. Luther Parody, M.D.   Today EKG showed normal sinus rhythm with new T wave abnormalities in the lateral leads.  Assessment/Plan #1 shortness of breath: This most likely from her small left-sided pleural effusion versus left basilar pneumonia. Her symptoms could also be from atypical angina. She has few symptoms suggestive of pneumonia and lacks a productive cough or leukocytosis. Nevertheless, she'll be  treated with broad-spectrum IV antibiotics for the possibility of a left base pneumonia. #2 chest pain and coronary artery disease: She continues to have substernal chest discomfort with a nonacute-appearing EKG on telemetry. We will continue current medications including IV nitroglycerin and admit her to a step down ICU bed. A cardiology consult and we'll be following her as well. We will check serial cardiac isoenzymes. We will also continue high-dose proton pump inhibitor treatment and she has a known history of gastroesophageal reflux. #3 anemia: Mild and stable, and we will recheck a CBC tomorrow. #4 status post left subclavian artery surgery: Doing well with some postoperative erythema and edema most likely from surgery and less likely from associated infection. We will monitor the site closely  and consider further imaging should be area of erythema expand or become associated with fluctuance. #4 anxiety and depression: A significant issue for her and she will be on scheduled dosing of anxiolytics and medication for depression. #5 weakness: This is been a significant issue for her as well and we may look into the possibility of skilled nursing facility rehabilitation.  Liandro Thelin G 12/19/2011, 6:51 PM

## 2011-12-19 NOTE — Consult Note (Signed)
Pt. Seen and examined. Agree with the NP/PA-C note as written. 71 yo female with history of CAD s/p CABG, recent subclavian stenosis with calcified artery not amenable to PCI. She underwent surgery by Dr. Trula Slade to the left subclavian last week and she reports the day after getting home she felt hot, weak and flushed. Significant swelling developed at the surgery site. There is now CXR evidence for pneumonia. Laboratory work reveals an elevated Troponin I to 1.89, however, CK and CKMB are negative. EKG shows inferior Q waves and TWI's suggestive of possible subacute inferior MI.  This would fit with elevated troponin which may be on the decline and negative CKMB. Nonetheless, the main issue is infection, in the lung and at the surgical site. For now, she is not complaining of chest pain. I would manage her conservatively. Agree with blood cultures, however, she already got antibiotics in the ER. Heparin is reasonable - she seems to be far enough from surgery to not be a bleeding risk. Follow cardiac enzymes, expect they will trend down. Check 2D echo.  Pixie Casino, MD, [facility] Attending Cardiologist [facility]

## 2011-12-19 NOTE — ED Notes (Signed)
Pt stated she is very uncomfortable and is wanting ativan.  Will notify MD.

## 2011-12-19 NOTE — ED Notes (Signed)
Patient from home recent carotid surgery left side in May 2013.  Intermittent shortness of breath and chest pain tightness 2/10 reported by EMS en route to ED. Ax4.

## 2011-12-19 NOTE — ED Notes (Signed)
Deanna [name] in Lab advised of critical Troponin value of 1.89. EDP advised.

## 2011-12-19 NOTE — ED Notes (Signed)
Patient remains on monitor and 2L oxygen with sats of 98%. Family at bedside. Patient continues to have chest pressure.

## 2011-12-19 NOTE — ED Notes (Signed)
Pt does not want any phone calls.

## 2011-12-19 NOTE — ED Notes (Signed)
Phone number for patient's daughter is [phone] Terrilee Files.

## 2011-12-20 LAB — COMPREHENSIVE METABOLIC PANEL
ALT: 9 U/L (ref 0–35)
AST: 24 U/L (ref 0–37)
Albumin: 3.2 g/dL — ABNORMAL LOW (ref 3.5–5.2)
Alkaline Phosphatase: 73 U/L (ref 39–117)
Calcium: 8.8 mg/dL (ref 8.4–10.5)
Glucose, Bld: 140 mg/dL — ABNORMAL HIGH (ref 70–99)
Potassium: 4.1 mEq/L (ref 3.5–5.1)
Sodium: 136 mEq/L (ref 135–145)
Total Protein: 6.5 g/dL (ref 6.0–8.3)

## 2011-12-20 LAB — CBC
HCT: 32.6 % — ABNORMAL LOW (ref 36.0–46.0)
MCHC: 33.7 g/dL (ref 30.0–36.0)
MCV: 82.3 fL (ref 78.0–100.0)
RDW: 14.3 % (ref 11.5–15.5)

## 2011-12-20 LAB — CARDIAC PANEL(CRET KIN+CKTOT+MB+TROPI)
CK, MB: 2.6 ng/mL (ref 0.3–4.0)
CK, MB: 2.4 ng/mL (ref 0.3–4.0)
Relative Index: INVALID (ref 0.0–2.5)
Relative Index: INVALID (ref 0.0–2.5)
Relative Index: INVALID (ref 0.0–2.5)
Total CK: 41 U/L (ref 7–177)
Total CK: 53 U/L (ref 7–177)
Total CK: 62 U/L (ref 7–177)
Troponin I: 1.22 ng/mL (ref <0.30)
Troponin I: 1.33 ng/mL (ref <0.30)
Troponin I: 1.25 ng/mL (ref <0.30)

## 2011-12-20 LAB — LIPID PANEL
Cholesterol: 126 mg/dL (ref 0–200)
Total CHOL/HDL Ratio: 4.2 RATIO

## 2011-12-20 LAB — HEPARIN LEVEL (UNFRACTIONATED): Heparin Unfractionated: 0.28 IU/mL — ABNORMAL LOW (ref 0.30–0.70)

## 2011-12-20 MED ORDER — SODIUM CHLORIDE 0.9 % IV SOLN
INTRAVENOUS | Status: DC
  Administered 2011-12-20: 93.3 mL/h via INTRAVENOUS

## 2011-12-20 MED ORDER — HEPARIN BOLUS VIA INFUSION
2000.0000 [IU] | Freq: Once | INTRAVENOUS | Status: AC
  Administered 2011-12-20: 2000 [IU] via INTRAVENOUS
  Filled 2011-12-20: qty 2000

## 2011-12-20 MED ORDER — SODIUM CHLORIDE 0.9 % IV SOLN
INTRAVENOUS | Status: DC
  Administered 2011-12-21: 04:00:00 via INTRAVENOUS

## 2011-12-20 MED ORDER — SODIUM CHLORIDE 0.9 % IJ SOLN: 3.0000 mL | INTRAMUSCULAR | Status: DC | PRN

## 2011-12-20 MED ORDER — SODIUM CHLORIDE 0.9 % IV SOLN: 1.0000 mL/kg/h | INTRAVENOUS | Status: DC

## 2011-12-20 MED ORDER — ASPIRIN 81 MG PO CHEW
324.0000 mg | CHEWABLE_TABLET | ORAL | Status: AC
  Administered 2011-12-20: 324 mg via ORAL
  Filled 2011-12-20: qty 4

## 2011-12-20 MED ORDER — SODIUM CHLORIDE 0.9 % IJ SOLN
3.0000 mL | Freq: Two times a day (BID) | INTRAMUSCULAR | Status: DC
  Administered 2011-12-20: 3 mL via INTRAVENOUS

## 2011-12-20 MED ORDER — HEPARIN (PORCINE) IN NACL 100-0.45 UNIT/ML-% IJ SOLN
1350.0000 [IU]/h | INTRAMUSCULAR | Status: DC
  Administered 2011-12-20: 1350 [IU]/h via INTRAVENOUS
  Filled 2011-12-20 (×2): qty 250

## 2011-12-20 MED ORDER — SODIUM CHLORIDE 0.9 % IV SOLN: 250.0000 mL | INTRAVENOUS | Status: DC | PRN

## 2011-12-20 MED ORDER — HEPARIN (PORCINE) IN NACL 100-0.45 UNIT/ML-% IJ SOLN
1600.0000 [IU]/h | INTRAMUSCULAR | Status: DC
  Administered 2011-12-21: 1600 [IU]/h via INTRAVENOUS
  Filled 2011-12-20 (×3): qty 250

## 2011-12-20 MED ORDER — LORAZEPAM 2 MG/ML IJ SOLN
INTRAMUSCULAR | Status: AC
  Filled 2011-12-20: qty 1

## 2011-12-20 NOTE — Progress Notes (Signed)
ANTICOAGULATION CONSULT NOTE - Follow Up Consult  Pharmacy Consult for Heparin Indication: chest pain/ACS  Allergies  Allergen Reactions  . Vicodin (Hydrocodone-Acetaminophen) Other (See Comments)    unknown   Patient Measurements: Heparin Dosing Weight: 72kg  Vital Signs: Temp: 97.6 F (36.4 C) (05/14 1905) Temp src: Oral (05/14 1905) BP: 156/69 mmHg (05/14 1800) Pulse Rate: 80  (05/14 1800)  Labs:  Basename 12/20/11 1950 12/20/11 1502 12/20/11 0825 12/20/11 0520 12/20/11 0500 12/19/11 2333 12/19/11 1133  HGB -- -- -- -- 11.0* -- 11.5*  HCT -- -- -- -- 32.6* -- 34.4*  PLT -- -- -- -- 161 -- 151  APTT -- -- -- -- -- -- 33  LABPROT -- -- -- -- -- -- 14.1  INR -- -- -- -- -- -- 1.07  HEPARINUNFRC 0.15* -- 0.28* -- -- <0.10* --  CREATININE -- -- -- -- 0.64 -- 0.52  CKTOTAL -- 41 45 53 -- -- --  CKMB -- 2.4 2.6 2.6 -- -- --  TROPONINI -- 1.25* 1.33* 1.22* -- -- --    Estimated Creatinine Clearance: 69.6 ml/min (by C-G formula based on Cr of 0.64).   Assessment: 71yof continues on heparin for NSTEMI. Heparin level came back subtherapeutic again. Will adjust rate.  Goal of Therapy:  Heparin level 0.3-0.7 units/ml Monitor platelets by anticoagulation protocol: Yes   Plan:  Heparin bolus 2000 units Increase heparin drip to 1600 units/hr F/u with heparin level in AM  [place], Josalynn Johndrow [place] 12/20/2011,8:30 PM

## 2011-12-20 NOTE — Progress Notes (Signed)
Clinical Social Worker updated family with bed offers received and they chose [facility] in [place], [place]. Facility is able to receive patient when medically ready.   Leandro Reasoner MSW, [place]

## 2011-12-20 NOTE — Progress Notes (Signed)
   CARE MANAGEMENT NOTE 12/20/2011  Patient:  Deanna [name], Deanna [name]   Account Number:  [id number]  Date Initiated:  12/20/2011  Documentation initiated by:  Elissa Hefty  Subjective/Objective Assessment:   adm w pos trop and pneumonia     Action/Plan:   lives w fam, pcp dr Bevelyn Buckles   Anticipated DC Date:  12/23/2011   Anticipated DC Plan:  [place]  CM consult      Choice offered to / List presented to:             Status of service:   Medicare Important Message given?   (If response is "NO", the following Medicare IM given date fields will be blank) Date Medicare IM given:   Date Additional Medicare IM given:    Discharge Disposition:    Per UR Regulation:  Reviewed for med. necessity/level of care/duration of stay  If discussed at [place] of Stay Meetings, dates discussed:    Comments:  5/14  12:37p debbie Merranda Bolls rn,bsn [id number]

## 2011-12-20 NOTE — Progress Notes (Signed)
ANTICOAGULATION CONSULT NOTE - Follow Up Consult  Pharmacy Consult for Heparin Indication: chest pain/ACS  Allergies  Allergen Reactions  . Vicodin (Hydrocodone-Acetaminophen) Other (See Comments)    unknown   Patient Measurements: Heparin Dosing Weight: 72kg  Vital Signs: Temp: 98.3 F (36.8 C) (05/14 0800) Temp src: Oral (05/14 0800) BP: 158/73 mmHg (05/14 0800) Pulse Rate: 82  (05/14 0900)  Labs:  Basename 12/20/11 0825 12/20/11 0520 12/20/11 0500 12/19/11 2333 12/19/11 1412 12/19/11 1133  HGB -- -- 11.0* -- -- 11.5*  HCT -- -- 32.6* -- -- 34.4*  PLT -- -- 161 -- -- 151  APTT -- -- -- -- -- 33  LABPROT -- -- -- -- -- 14.1  INR -- -- -- -- -- 1.07  HEPARINUNFRC 0.28* -- -- <0.10* -- --  CREATININE -- -- 0.64 -- -- 0.52  CKTOTAL -- 53 -- 62 70 --  CKMB -- 2.6 -- 3.0 3.1 --  TROPONINI -- 1.22* -- 1.39* 1.40* --    Estimated Creatinine Clearance: 69.6 ml/min (by C-G formula based on Cr of 0.64).  Medications:  Heparin @ 1200 units/hr  Assessment: 71yof continues on heparin for NSTEMI with a slightly subtherapeutic heparin level despite re-bolus and rate increase this morning. No bleeding noted. Plan for cath today.  Goal of Therapy:  Heparin level 0.3-0.7 units/ml Monitor platelets by anticoagulation protocol: Yes   Plan:  1) Increase heparin to 1350 units/hr 2) Follow up after cath  Deboraha Sprang 12/20/2011,10:05 AM

## 2011-12-20 NOTE — Progress Notes (Signed)
ANTICOAGULATION CONSULT NOTE - Follow Up Consult  Pharmacy Consult for heparin Indication: chest pain/ACS  Labs:  [facility] 12/19/11 2333 12/19/11 1412 12/19/11 1133  HGB -- -- 11.5*  HCT -- -- 34.4*  PLT -- -- 151  APTT -- -- 33  LABPROT -- -- 14.1  INR -- -- 1.07  HEPARINUNFRC <0.10* -- --  CREATININE -- -- 0.52  CKTOTAL 62 70 74  CKMB 3.0 3.1 3.1  TROPONINI 1.39* 1.40* 1.89*    Assessment: 71yo female undetectable on heparin with initial dosing for CP.  Goal of Therapy:  Heparin level 0.3-0.7 units/ml   Plan:  Will give heparin bolus of 2000 units IV x1 and increase gtt by 4 units/kg/hr to 1200 units/hr and check level in 6-8hr.  Rogue Bussing PharmD BCPS 12/20/2011,1:26 AM

## 2011-12-20 NOTE — Clinical Social Work Psychosocial (Signed)
     Clinical Social Work Department BRIEF PSYCHOSOCIAL ASSESSMENT 12/20/2011  Patient:  Deanna [name], Deanna [name]     Account Number:  [id number]     Admit date:  12/19/2011  Clinical Social Worker:  Otilio Saber  Date/Time:  12/20/2011 02:32 PM  Referred by:  Physician  Date Referred:  12/20/2011 Referred for  SNF Placement   Other Referral:   Interview type:  Other - See comment Other interview type:   patient and daughter, Deanna [name]    PSYCHOSOCIAL DATA Living Status:  ALONE Admitted from facility:   Level of care:   Primary support name:  Deanna [name] Primary support relationship to patient:  CHILD, ADULT Degree of support available:   supportive    CURRENT CONCERNS Current Concerns  Post-Acute Placement  Other - See comment   Other Concerns:   Advance directive    SOCIAL WORK ASSESSMENT / PLAN Clinical Social Worker has received referral for SNF placement. CSW met with patient and daughter, who was at bedside. Patient was drowsy during assessment. Per daughter, patient does live alone and daughter lives in [place], [place]. Daughter had questions about the advance directive packet that she had for her mother and CSW answered questions to best of ability. Daughter is receptive to patient being faxed out to [facility], with preference in [place] will complete FL2 for MD's signature and will update patient and family when bed offers are received.   Assessment/plan status:  Psychosocial Support/Ongoing Assessment of Needs Other assessment/ plan:   Information/referral to community resources:    PATIENTS/FAMILYS RESPONSE TO PLAN OF CARE: Daughter, Deanna [name] is receptive to patient being faxed out in [facility] for SNF placement.

## 2011-12-20 NOTE — Progress Notes (Signed)
Subjective: She feels better today. Has had mild problems with shortterm memory, is no longer having chest pain or dyspnea. Yesterday she was started on scheduled benzodiazepine to reduce level of anxiety.  Objective: Vital signs in last 24 hours: Temp:  [98 F (36.7 C)-98.5 F (36.9 C)] 98.3 F (36.8 C) (05/14 0800) Pulse Rate:  [81-103] 82  (05/14 0900) Resp:  [15-25] 23  (05/14 0900) BP: (94-183)/(49-98) 158/73 mmHg (05/14 0800) SpO2:  [91 %-100 %] 91 % (05/14 0900) Weight:  [93.3 kg (205 lb 11 oz)-93.4 kg (205 lb 14.6 oz)] 93.4 kg (205 lb 14.6 oz) (05/14 0900) Weight change:    Intake/Output from previous day: 05/13 0701 - 05/14 0700 In: 289.7 [I.V.:289.7] Out: 600 [Urine:600]   General appearance: alert, cooperative and no distress Resp: clear to auscultation bilaterally Cardio: regular rate and rhythm, S1, S2 normal, no murmur, click, rub or gallop Extremities: extremities normal, atraumatic, no cyanosis or edema  Lab Results:  Basename 12/20/11 0500 12/19/11 1133  WBC 6.1 7.8  HGB 11.0* 11.5*  HCT 32.6* 34.4*  PLT 161 151   BMET  Basename 12/20/11 0500 12/19/11 1133  NA 136 135  K 4.1 3.7  CL 98 98  CO2 28 27  GLUCOSE 140* 119*  BUN 7 8  CREATININE 0.64 0.52  CALCIUM 8.8 9.2   CMET CMP     Component Value Date/Time   NA 136 12/20/2011 0500   K 4.1 12/20/2011 0500   CL 98 12/20/2011 0500   CO2 28 12/20/2011 0500   GLUCOSE 140* 12/20/2011 0500   BUN 7 12/20/2011 0500   CREATININE 0.64 12/20/2011 0500   CALCIUM 8.8 12/20/2011 0500   PROT 6.5 12/20/2011 0500   ALBUMIN 3.2* 12/20/2011 0500   AST 24 12/20/2011 0500   ALT 9 12/20/2011 0500   ALKPHOS 73 12/20/2011 0500   BILITOT 0.7 12/20/2011 0500   GFRNONAA 88* 12/20/2011 0500   GFRAA >90 12/20/2011 0500    CBG (last 3)  No results found for this basename: GLUCAP:3 in the last 72 hours  INR RESULTS:   Lab Results  Component Value Date   INR 1.07 12/19/2011   INR 1.03 12/08/2011   INR 1.03 06/02/2011      Studies/Results: Dg Chest 2 View  12/19/2011  *RADIOLOGY REPORT*  Clinical Data:  Cough and shortness of breath.  CHEST - 2 VIEW  Comparison: Plain film chest 12/15/2011 and 06/02/2011.  CT chest 06/09/2011.  Findings: The patient has a left pleural effusion and basilar airspace disease.  Right lung is clear.  No pneumothorax.  Heart size normal.  Post-traumatic change right shoulder noted.  IMPRESSION: Left effusion and basilar airspace disease compatible with pneumonia.  Recommend follow-up films to clearing.  Original Report Authenticated By: Arvid Right. Luther Parody, M.D.    Medications: I have reviewed the patient's current medications.  Assessment/Plan: #1 Pneumonia: stable on current meds. Will likely need shortterm SNF rehabilitation following her surgery and NSTEMI. #2 Dyspnea: improved with stabilization of angina. Plan is for cardiac cath later today. #3 Anemia:  Mild and stable. #4 Forgetfulness:  Mild and probably worsened due to scheduled ativan.  LOS: 1 day   Jedediah Noda G 12/20/2011, 10:00 AM

## 2011-12-20 NOTE — Clinical Social Work Placement (Signed)
     Clinical Social Work Department CLINICAL SOCIAL WORK PLACEMENT NOTE 12/29/2011  Patient:  Deanna [name], Deanna [name]  Account Number:  [id number] Admit date:  12/19/2011  Clinical Social Worker:  Leandro Reasoner, Latanya Presser  Date/time:  12/20/2011 02:45 PM  Clinical Social Work is seeking post-discharge placement for this patient at the following level of care:   [place]   (*CSW will update this form in Epic as items are completed)   12/20/2011  Patient/family provided with [place] Department of Clinical Social Works list of facilities offering this level of care within the geographic area requested by the patient (or if unable, by the patients family).  12/20/2011  Patient/family informed of their freedom to choose among providers that offer the needed level of care, that participate in Medicare, Medicaid or managed care program needed by the patient, have an available bed and are willing to accept the patient.  12/20/2011  Patient/family informed of [facility] ownership interest in [facility], as well as of the fact that they are under no obligation to receive care at this facility.  PASARR submitted to EDS on 06/07/2011 PASARR number received from EDS on 06/07/2011  FL2 transmitted to all facilities in geographic area requested by pt/family on  12/20/2011 FL2 transmitted to all facilities within larger geographic area on   Patient informed that his/her managed care company has contracts with or will negotiate with  certain facilities, including the following:     Patient/family informed of bed offers received:  12/20/2011 Patient chooses bed at OTHER Physician recommends and patient chooses bed at    Patient to be transferred to OTHER on  12/29/2011 Patient to be transferred to facility by ptar  The following physician request were entered in Epic:   Additional Comments: Patient and family chose [facility] for short term placement.

## 2011-12-20 NOTE — Care Management Note (Signed)
    Page 1 of 1   12/20/2011     3:11:33 PM   CARE MANAGEMENT NOTE 12/20/2011  Patient:  Deanna [name], Deanna [name]   Account Number:  [id number]  Date Initiated:  12/20/2011  Documentation initiated by:  Elissa Hefty  Subjective/Objective Assessment:   adm w pos trop and pneumonia     Action/Plan:   pcp dr Bevelyn Buckles   Anticipated DC Date:  12/23/2011   Anticipated DC Plan:  [place]  CM consult      Per UR Regulation:  Reviewed for med. necessity/level of care/duration of stay  Comments:  Contact:  [name], daughter [phone]  12/20/11 [place] Met with pt and daughter, @ bedside.  Per daughter, pt lives alone and has been having difficulty managing. Daughter and pt agree to ST-SNF for rehab and daughter would like pt transferred to a SNF near her home in [place] will complete FL2 and place in chart.  5/14  12:37p debbie dowell rn,bsn [id number]

## 2011-12-20 NOTE — Progress Notes (Signed)
  Echocardiogram 2D Echocardiogram has been performed.  Deanna [name] 12/20/2011, 9:58 AM

## 2011-12-20 NOTE — Progress Notes (Signed)
[facility]  Subjective: She has a little chest tightness still.  No SOB or orthopnea.  Objective: Vital signs in last 24 hours: Temp:  [98 F (36.7 C)-98.5 F (36.9 C)] 98.1 F (36.7 C) (05/14 0400) Pulse Rate:  [81-103] 81  (05/14 0600) Resp:  [16-25] 22  (05/14 0600) BP: (94-183)/(49-98) 158/69 mmHg (05/14 0600) SpO2:  [93 %-100 %] 97 % (05/14 0600) Weight:  [93.3 kg (205 lb 11 oz)] 93.3 kg (205 lb 11 oz) (05/14 0200)    Intake/Output from previous day: 05/13 0701 - 05/14 0700 In: 214.7 [I.V.:214.7] Out: 600 [Urine:600] Intake/Output this shift:    Medications Current Facility-Administered Medications  Medication Dose Route Frequency Provider Last Rate Last Dose  . 0.9 %  sodium chloride infusion  250 mL Intravenous PRN Donnajean Lopes, MD      . 0.9 %  sodium chloride infusion   Intravenous Continuous Donnajean Lopes, MD 10 mL/hr at 12/19/11 2300    . acetaminophen (TYLENOL) tablet 650 mg  650 mg Oral Q6H PRN Donnajean Lopes, MD       Or  . acetaminophen (TYLENOL) suppository 650 mg  650 mg Rectal Q6H PRN Donnajean Lopes, MD      . alum & mag hydroxide-simeth (MAALOX/MYLANTA) 200-200-20 MG/5ML suspension 30 mL  30 mL Oral Q6H PRN Donnajean Lopes, MD      . amLODipine (NORVASC) tablet 5 mg  5 mg Oral Daily Donnajean Lopes, MD      . aspirin chewable tablet 324 mg  324 mg Oral Once Julianne Rice, MD   324 mg at 12/19/11 1258  . aspirin chewable tablet 81 mg  81 mg Oral Daily Donnajean Lopes, MD      . atorvastatin (LIPITOR) tablet 20 mg  20 mg Oral q1800 Donnajean Lopes, MD      . bisacodyl (DULCOLAX) suppository 10 mg  10 mg Rectal Daily PRN Donnajean Lopes, MD      . clopidogrel (PLAVIX) tablet 75 mg  75 mg Oral Daily Donnajean Lopes, MD      . heparin ADULT infusion 100 units/mL (25000 units/250 mL)  1,200 Units/hr Intravenous Continuous Rogue Bussing, PHARMD 12 mL/hr at 12/20/11 0138 1,200 Units/hr at 12/20/11  0138  . heparin bolus via infusion 2,000 Units  2,000 Units Intravenous Once Rogue Bussing, PHARMD   2,000 Units at 12/20/11 0139  . heparin bolus via infusion 3,000 Units  3,000 Units Intravenous Once Rocky Crafts Hammons, PHARMD   3,000 Units at 12/19/11 1457  . irbesartan (AVAPRO) tablet 75 mg  75 mg Oral Daily Donnajean Lopes, MD      . levofloxacin [facility]) IVPB 500 mg  500 mg Intravenous To ER Julianne Rice, MD   500 mg at 12/19/11 1337  . levothyroxine (SYNTHROID, LEVOTHROID) tablet 175 mcg  175 mcg Oral Daily Donnajean Lopes, MD      . LORazepam (ATIVAN) tablet 0.5 mg  0.5 mg Oral QID Donnajean Lopes, MD   0.5 mg at 12/19/11 2114  . metoprolol (LOPRESSOR) tablet 50 mg  50 mg Oral BID Donnajean Lopes, MD   50 mg at 12/20/11 0131  . nitroGLYCERIN 0.2 mg/mL in dextrose 5 % infusion  2-200 mcg/min Intravenous Titrated Brett Canales, PA 3 mL/hr at 12/19/11 2200 10 mcg/min at 12/19/11 2200  . oxyCODONE-acetaminophen (PERCOCET) 5-325 MG per tablet 1 tablet  1 tablet Oral Q6H PRN Ermalene Searing  Philip Aspen, MD      . pantoprazole (PROTONIX) EC tablet 40 mg  40 mg Oral Q1200 Donnajean Lopes, MD   40 mg at 12/20/11 0130  . pantoprazole (PROTONIX) injection 40 mg  40 mg Intravenous Once Julianne Rice, MD   40 mg at 12/19/11 1259  . piperacillin-tazobactam (ZOSYN) IVPB 3.375 g  3.375 g Intravenous Once Julianne Rice, MD   3.375 g at 12/19/11 1248  . promethazine (PHENERGAN) tablet 12.5 mg  12.5 mg Oral Q6H PRN Donnajean Lopes, MD      . senna-docusate (Senokot-S) tablet 1 tablet  1 tablet Oral QHS PRN Donnajean Lopes, MD      . sertraline (ZOLOFT) tablet 100 mg  100 mg Oral Daily Donnajean Lopes, MD      . sertraline (ZOLOFT) tablet 50 mg  50 mg Oral QHS Donnajean Lopes, MD      . sodium chloride 0.9 % bolus 500 mL  500 mL Intravenous Once Julianne Rice, MD   500 mL at 12/19/11 1247  . sodium chloride 0.9 % injection 3 mL  3 mL Intravenous Q12H Donnajean Lopes, MD        . sodium chloride 0.9 % injection 3 mL  3 mL Intravenous Q12H Donnajean Lopes, MD      . sodium chloride 0.9 % injection 3 mL  3 mL Intravenous PRN Donnajean Lopes, MD      . vancomycin (VANCOCIN) IVPB 1000 mg/200 mL premix  1,000 mg Intravenous Once Julianne Rice, MD   1,000 mg at 12/19/11 1846  . vitamin B-12 (CYANOCOBALAMIN) tablet 1,000 mcg  1,000 mcg Oral Daily Donnajean Lopes, MD      . zolpidem [facility]) tablet 5 mg  5 mg Oral QHS PRN Donnajean Lopes, MD      . DISCONTD: LORazepam (ATIVAN) tablet 0.5 mg  0.5 mg Oral Q4H PRN Donnajean Lopes, MD   0.5 mg at 12/19/11 1857    PE: General appearance: alert, cooperative and no distress Lungs: Clear on the right.  mild basilar rales on the left. Heart: regular rate and rhythm, S1, S2 normal, no murmur, click, rub or gallop Extremities: No LEE Pulses: 2+ Right radial..  1+ left radial.  Lab Results:   Basename 12/20/11 0500 12/19/11 1133  WBC 6.1 7.8  HGB 11.0* 11.5*  HCT 32.6* 34.4*  PLT 161 151   BMET  Basename 12/20/11 0500 12/19/11 1133  NA 136 135  K 4.1 3.7  CL 98 98  CO2 28 27  GLUCOSE 140* 119*  BUN 7 8  CREATININE 0.64 0.52  CALCIUM 8.8 9.2   PT/INR  Basename 12/19/11 1133  LABPROT 14.1  INR 1.07    Cardiac Enzymes CK, MB 3.1 3.1 3.0 2.6 Total CK 74 70 62 53 Troponin I 1.89 1.40 1.39 1.22   Studies/Results: @RISRSLT2 @   Assessment/Plan  Principal Problem:  *Shortness of breath Active Problems:  HYPOTHYROIDISM  OBESITY, MODERATE  ANXIETY DEPRESSION  HYPERTENSION  CAD  GERD  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  She likely had an MI on Friday night at the first onset of pain.  S/P left carotid to subclavian bypass last Thursday.  2D echo pending.  Afebrile, no leukocytosis.  Needs LHC.  She has not eaten breakfast.  Maybe  today.  LOS: 1 day    HAGER,BRYAN W 12/20/2011 7:53 AM   Agree with note written by Luisa Dago PAC  Pt of Dr. Jacklynn Bue with recent LCCA to LSCA bypass by Dr. Trula Slade last week. Remote CABG. Developed CP at home last Friday evening (day after D/C). Admitted to hosp yesterday with + trop, chest pressure. NSTEMI. EKG shows inferolateral TWI. Recent OP treatrment of ? PNA. CXR shows left pleural effusion. Plan LHC via left femoral approach (H/O Left to right fem fem bypass graft remotely). On IV hep and ntg.  Lorretta Harp 12/20/2011 8:38 AM

## 2011-12-21 ENCOUNTER — Encounter (HOSPITAL_COMMUNITY)
Admission: EM | Disposition: A | Discharge status: Skilled Nursing Facility | Payer: Self-pay | Source: Home / Self Care | Attending: Surgery

## 2011-12-21 HISTORY — PX: LEFT HEART CATHETERIZATION WITH CORONARY/GRAFT ANGIOGRAM: SHX5450

## 2011-12-21 LAB — CBC
Hemoglobin: 10.9 g/dL — ABNORMAL LOW (ref 12.0–15.0)
MCV: 82.6 fL (ref 78.0–100.0)
Platelets: 174 10*3/uL (ref 150–400)
RBC: 3.96 MIL/uL (ref 3.87–5.11)
WBC: 6.4 10*3/uL (ref 4.0–10.5)

## 2011-12-21 SURGERY — LEFT HEART CATHETERIZATION WITH CORONARY/GRAFT ANGIOGRAM
Anesthesia: LOCAL

## 2011-12-21 MED ORDER — SODIUM CHLORIDE 0.9 % IV SOLN
1.0000 mL/kg/h | INTRAVENOUS | Status: AC
  Administered 2011-12-21: 1 mL/kg/h via INTRAVENOUS

## 2011-12-21 MED ORDER — LIDOCAINE HCL (PF) 1 % IJ SOLN
INTRAMUSCULAR | Status: AC
  Filled 2011-12-21: qty 30

## 2011-12-21 MED ORDER — ROSUVASTATIN CALCIUM 10 MG PO TABS
10.0000 mg | ORAL_TABLET | Freq: Every day | ORAL | Status: DC
  Administered 2011-12-21 – 2011-12-28 (×8): 10 mg via ORAL
  Filled 2011-12-21 (×9): qty 1

## 2011-12-21 MED ORDER — LABETALOL HCL 5 MG/ML IV SOLN
INTRAVENOUS | Status: AC
  Filled 2011-12-21: qty 4

## 2011-12-21 MED ORDER — MIDAZOLAM HCL 2 MG/2ML IJ SOLN
INTRAMUSCULAR | Status: AC
  Filled 2011-12-21: qty 2

## 2011-12-21 MED ORDER — SODIUM CHLORIDE 0.9 % IV SOLN: 250.0000 mL | INTRAVENOUS | Status: DC

## 2011-12-21 MED ORDER — IRBESARTAN 150 MG PO TABS
150.0000 mg | ORAL_TABLET | Freq: Every day | ORAL | Status: DC
  Administered 2011-12-22 – 2011-12-29 (×7): 150 mg via ORAL
  Filled 2011-12-21 (×8): qty 1

## 2011-12-21 MED ORDER — CEPHALEXIN 500 MG PO CAPS
500.0000 mg | ORAL_CAPSULE | Freq: Four times a day (QID) | ORAL | Status: DC
  Administered 2011-12-21 (×2): 500 mg via ORAL
  Filled 2011-12-21 (×8): qty 1

## 2011-12-21 MED ORDER — SODIUM CHLORIDE 0.9 % IJ SOLN
3.0000 mL | Freq: Two times a day (BID) | INTRAMUSCULAR | Status: DC
  Administered 2011-12-22 – 2011-12-29 (×12): 3 mL via INTRAVENOUS

## 2011-12-21 MED ORDER — SODIUM CHLORIDE 0.9 % IJ SOLN: 3.0000 mL | INTRAMUSCULAR | Status: DC | PRN

## 2011-12-21 MED ORDER — ASPIRIN 81 MG PO CHEW
81.0000 mg | CHEWABLE_TABLET | Freq: Every day | ORAL | Status: DC
  Administered 2011-12-22 – 2011-12-29 (×7): 81 mg via ORAL
  Filled 2011-12-21 (×7): qty 1

## 2011-12-21 MED ORDER — NITROGLYCERIN 0.2 MG/ML ON CALL CATH LAB
INTRAVENOUS | Status: AC
  Filled 2011-12-21: qty 1

## 2011-12-21 MED ORDER — ONDANSETRON HCL 4 MG/2ML IJ SOLN: 4.0000 mg | Freq: Four times a day (QID) | INTRAMUSCULAR | Status: DC | PRN

## 2011-12-21 MED ORDER — LORAZEPAM 0.5 MG PO TABS
0.5000 mg | ORAL_TABLET | Freq: Two times a day (BID) | ORAL | Status: DC | PRN
  Administered 2011-12-21 – 2011-12-22 (×3): 0.5 mg via ORAL
  Filled 2011-12-21 (×2): qty 1

## 2011-12-21 MED ORDER — HEPARIN (PORCINE) IN NACL 2-0.9 UNIT/ML-% IJ SOLN
INTRAMUSCULAR | Status: AC
  Filled 2011-12-21: qty 2000

## 2011-12-21 MED ORDER — FENTANYL CITRATE 0.05 MG/ML IJ SOLN
INTRAMUSCULAR | Status: AC
  Filled 2011-12-21: qty 2

## 2011-12-21 MED ORDER — ASPIRIN 81 MG PO CHEW
324.0000 mg | CHEWABLE_TABLET | Freq: Once | ORAL | Status: AC
  Administered 2011-12-21: 324 mg via ORAL

## 2011-12-21 NOTE — Progress Notes (Signed)
ANTICOAGULATION CONSULT NOTE - Follow Up Consult  Pharmacy Consult for Lovenox Indication: chest pain/ACS/MI  Allergies  Allergen Reactions  . Vicodin (Hydrocodone-Acetaminophen) Other (See Comments)    unknown   Patient Measurements: Heparin Dosing Weight: 72kg  Vital Signs: Temp: 97.6 F (36.4 C) (05/15 1300) Temp src: Oral (05/15 1300) BP: 142/66 mmHg (05/15 1628) Pulse Rate: 77  (05/15 1300)  Labs:  Basename 12/21/11 0449 12/20/11 1950 12/20/11 1502 12/20/11 0825 12/20/11 0520 12/20/11 0500 12/19/11 1133  HGB 10.9* -- -- -- -- 11.0* --  HCT 32.7* -- -- -- -- 32.6* 34.4*  PLT 174 -- -- -- -- 161 151  APTT -- -- -- -- -- -- 33  LABPROT -- -- -- -- -- -- 14.1  INR -- -- -- -- -- -- 1.07  HEPARINUNFRC 0.58 0.15* -- 0.28* -- -- --  CREATININE -- -- -- -- -- 0.64 0.52  CKTOTAL -- -- 41 45 53 -- --  CKMB -- -- 2.4 2.6 2.6 -- --  TROPONINI -- -- 1.25* 1.33* 1.22* -- --    Estimated Creatinine Clearance: 69.6 ml/min (by C-G formula based on Cr of 0.64).   Assessment: 71 yo female admitted with elevated cardiac enzymes, and now s/p cardiac cath.  She has some occlusion that is not amenable to PCI.  The plan for her this evening is to begin SQ Enoxaparin for her acute coronary syndrome.  Goal of Therapy:  Therapeutic dosing for ACS   Plan:  Will begin Enoxaparin 95 mg SQ q12 hours F/U CBC every 72 hours and monitor for bleeding complications Monitor platelets by anticoagulation protocol: Yes  Rober Minion, PharmD., MS Clinical Pharmacist Pager:  [phone] Thank you for allowing pharmacy to be part of this patients care team. 12/21/2011,8:15 PM

## 2011-12-21 NOTE — Progress Notes (Signed)
Pt was transported to the cath lab with RN on monitor and report was given to cath lab staff

## 2011-12-21 NOTE — Progress Notes (Signed)
Patient ID: Deanna [name], female   DOB: 12/14/1940, 71 y.o.   MRN: [id number] The patient is status post left carotid subclavian bypass proximally 5 days ago. He was discharged home uneventfully. She was readmitted with chest pain and enzymes suggesting a myocardial infarction. Today she does on physical exam have a palpable left radial pulse. She does have the usual healing ridge of her incision. She does have more than the usual amount of erythema below this. He is afebrile and her normal white blood cell count. She has been started on antibiotic therapy. I discussed this with the patient and her daughter present. Dr. Trula Slade will see her tomorrow for continued followup

## 2011-12-21 NOTE — Progress Notes (Signed)
Subjective: Currently she is not having significant dyspnea or chest discomfort. Cardiac cath is planned for today.  Objective: Vital signs in last 24 hours: Temp:  [97.6 F (36.4 C)-98.5 F (36.9 C)] 97.6 F (36.4 C) (05/15 0400) Pulse Rate:  [70-89] 71  (05/15 0600) Resp:  [17-24] 20  (05/15 0600) BP: (139-160)/(56-88) 148/67 mmHg (05/15 0600) SpO2:  [91 %-98 %] 97 % (05/15 0600) Weight:  [93.4 kg (205 lb 14.6 oz)] 93.4 kg (205 lb 14.6 oz) (05/14 0900) Weight change: 0.1 kg (3.5 oz)   Intake/Output from previous day: 05/14 0701 - 05/15 0700 In: 1379.2 [I.V.:1379.2] Out: 2150 [Urine:2150]   General appearance: alert, cooperative and no distress Resp: clear to auscultation bilaterally Cardio: regular rate and rhythm, S1, S2 normal, no murmur, click, rub or gallop Extremities: extremities normal, atraumatic, no cyanosis or edema  Lab Results:  Basename 12/21/11 0449 12/20/11 0500  WBC 6.4 6.1  HGB 10.9* 11.0*  HCT 32.7* 32.6*  PLT 174 161   BMET  Basename 12/20/11 0500 12/19/11 1133  NA 136 135  K 4.1 3.7  CL 98 98  CO2 28 27  GLUCOSE 140* 119*  BUN 7 8  CREATININE 0.64 0.52  CALCIUM 8.8 9.2   CMET CMP     Component Value Date/Time   NA 136 12/20/2011 0500   K 4.1 12/20/2011 0500   CL 98 12/20/2011 0500   CO2 28 12/20/2011 0500   GLUCOSE 140* 12/20/2011 0500   BUN 7 12/20/2011 0500   CREATININE 0.64 12/20/2011 0500   CALCIUM 8.8 12/20/2011 0500   PROT 6.5 12/20/2011 0500   ALBUMIN 3.2* 12/20/2011 0500   AST 24 12/20/2011 0500   ALT 9 12/20/2011 0500   ALKPHOS 73 12/20/2011 0500   BILITOT 0.7 12/20/2011 0500   GFRNONAA 88* 12/20/2011 0500   GFRAA >90 12/20/2011 0500    CBG (last 3)  No results found for this basename: GLUCAP:3 in the last 72 hours  INR RESULTS:   Lab Results  Component Value Date   INR 1.07 12/19/2011   INR 1.03 12/08/2011   INR 1.03 06/02/2011     Studies/Results: Dg Chest 2 View  12/19/2011  *RADIOLOGY REPORT*  Clinical Data:  Cough  and shortness of breath.  CHEST - 2 VIEW  Comparison: Plain film chest 12/15/2011 and 06/02/2011.  CT chest 06/09/2011.  Findings: The patient has a left pleural effusion and basilar airspace disease.  Right lung is clear.  No pneumothorax.  Heart size normal.  Post-traumatic change right shoulder noted.  IMPRESSION: Left effusion and basilar airspace disease compatible with pneumonia.  Recommend follow-up films to clearing.  Original Report Authenticated By: Arvid Right. Luther Parody, M.D.    Medications: I have reviewed the patient's current medications.  Assessment/Plan: #1 Dyspnea:  Much improved and will check a BNP level in the morning. Will plan on transfer to a SNF in 24-48 hours if condition continues to improve. #2 Anemia:  Stable and we will check iron studies.   LOS: 2 days   Jarissa Sheriff G 12/21/2011, 8:14 AM

## 2011-12-21 NOTE — Progress Notes (Signed)
[name]  Subjective: A little chest tightness still.  Objective: Vital signs in last 24 hours: Temp:  [97.6 F (36.4 C)-98.5 F (36.9 C)] 97.6 F (36.4 C) (05/15 0400) Pulse Rate:  [70-89] 71  (05/15 0600) Resp:  [15-24] 20  (05/15 0600) BP: (139-160)/(56-88) 148/67 mmHg (05/15 0600) SpO2:  [91 %-98 %] 97 % (05/15 0600) Weight:  [93.4 kg (205 lb 14.6 oz)] 93.4 kg (205 lb 14.6 oz) (05/14 0900)    Intake/Output from previous day: 05/14 0701 - 05/15 0700 In: 1379.2 [I.V.:1379.2] Out: 2150 [Urine:2150] Intake/Output this shift:    Medications Current Facility-Administered Medications  Medication Dose Route Frequency Provider Last Rate Last Dose  . 0.9 %  sodium chloride infusion  250 mL Intravenous PRN Donnajean Lopes, MD   10 mL at 12/20/11 2206  . 0.9 %  sodium chloride infusion   Intravenous Continuous Donnajean Lopes, MD 10 mL/hr at 12/19/11 2300    . 0.9 %  sodium chloride infusion  250 mL Intravenous PRN Brett Canales, PA      . 0.9 %  sodium chloride infusion   Intravenous Continuous Lorretta Harp, MD 93.3 mL/hr at 12/21/11 0335    . acetaminophen (TYLENOL) tablet 650 mg  650 mg Oral Q6H PRN Donnajean Lopes, MD       Or  . acetaminophen (TYLENOL) suppository 650 mg  650 mg Rectal Q6H PRN Donnajean Lopes, MD      . alum & mag hydroxide-simeth (MAALOX/MYLANTA) 200-200-20 MG/5ML suspension 30 mL  30 mL Oral Q6H PRN Donnajean Lopes, MD      . amLODipine (NORVASC) tablet 5 mg  5 mg Oral Daily Donnajean Lopes, MD   5 mg at 12/20/11 0929  . aspirin chewable tablet 324 mg  324 mg Oral Pre-Cath Einar Pheasant [place], [place]   324 mg at 12/20/11 0930  . aspirin chewable tablet 324 mg  324 mg Oral Once Pixie Casino, MD   324 mg at 12/21/11 0647  . aspirin chewable tablet 81 mg  81 mg Oral Daily Pixie Casino, MD      . atorvastatin (LIPITOR) tablet 20 mg  20 mg Oral q1800 Donnajean Lopes, MD   20 mg at 12/20/11 1819  . bisacodyl  (DULCOLAX) suppository 10 mg  10 mg Rectal Daily PRN Donnajean Lopes, MD      . clopidogrel (PLAVIX) tablet 75 mg  75 mg Oral Daily Donnajean Lopes, MD   75 mg at 12/20/11 0930  . heparin ADULT infusion 100 units/mL (25000 units/250 mL)  1,600 Units/hr Intravenous Continuous Donnajean Lopes, MD 16 mL/hr at 12/21/11 0334 1,600 Units/hr at 12/21/11 0334  . heparin bolus via infusion 2,000 Units  2,000 Units Intravenous Once Donnajean Lopes, MD   2,000 Units at 12/20/11 2042  . irbesartan (AVAPRO) tablet 75 mg  75 mg Oral Daily Donnajean Lopes, MD   75 mg at 12/20/11 0930  . levothyroxine (SYNTHROID, LEVOTHROID) tablet 175 mcg  175 mcg Oral Daily Donnajean Lopes, MD   175 mcg at 12/20/11 0930  . LORazepam (ATIVAN) 2 MG/ML injection           . LORazepam (ATIVAN) tablet 0.5 mg  0.5 mg Oral QID Donnajean Lopes, MD   0.5 mg at 12/21/11 0647  . metoprolol (LOPRESSOR) tablet 50 mg  50 mg Oral BID Donnajean Lopes, MD   50 mg at 12/20/11  2149  . nitroGLYCERIN 0.2 mg/mL in dextrose 5 % infusion  2-200 mcg/min Intravenous Titrated Brett Canales, PA 3 mL/hr at 12/19/11 2200 10 mcg/min at 12/19/11 2200  . oxyCODONE-acetaminophen (PERCOCET) 5-325 MG per tablet 1 tablet  1 tablet Oral Q6H PRN Donnajean Lopes, MD      . pantoprazole (PROTONIX) EC tablet 40 mg  40 mg Oral Q1200 Donnajean Lopes, MD   40 mg at 12/20/11 1204  . promethazine (PHENERGAN) tablet 12.5 mg  12.5 mg Oral Q6H PRN Donnajean Lopes, MD      . senna-docusate (Senokot-S) tablet 1 tablet  1 tablet Oral QHS PRN Donnajean Lopes, MD      . sertraline (ZOLOFT) tablet 100 mg  100 mg Oral Daily Donnajean Lopes, MD   100 mg at 12/20/11 0931  . sertraline (ZOLOFT) tablet 50 mg  50 mg Oral QHS Donnajean Lopes, MD   50 mg at 12/20/11 2149  . sodium chloride 0.9 % injection 3 mL  3 mL Intravenous Q12H Donnajean Lopes, MD   3 mL at 12/20/11 1000  . sodium chloride 0.9 % injection 3 mL  3 mL Intravenous Q12H Donnajean Lopes, MD       . sodium chloride 0.9 % injection 3 mL  3 mL Intravenous PRN Donnajean Lopes, MD      . sodium chloride 0.9 % injection 3 mL  3 mL Intravenous Q12H Brett Canales, PA   3 mL at 12/20/11 2150  . sodium chloride 0.9 % injection 3 mL  3 mL Intravenous PRN Brett Canales, PA      . vitamin B-12 (CYANOCOBALAMIN) tablet 1,000 mcg  1,000 mcg Oral Daily Donnajean Lopes, MD   1,000 mcg at 12/20/11 0930  . zolpidem (AMBIEN) tablet 5 mg  5 mg Oral QHS PRN Donnajean Lopes, MD      . DISCONTD: 0.9 %  sodium chloride infusion  1 mL/kg/hr Intravenous Continuous Brett Canales, PA      . DISCONTD: 0.9 %  sodium chloride infusion   Intravenous Continuous Brett Canales, PA 93.3 mL/hr at 12/20/11 1000    . DISCONTD: aspirin chewable tablet 81 mg  81 mg Oral Daily Donnajean Lopes, MD      . DISCONTD: heparin ADULT infusion 100 units/mL (25000 units/250 mL)  1,200 Units/hr Intravenous Continuous Rogue Bussing, PHARMD 12 mL/hr at 12/20/11 0138 1,200 Units/hr at 12/20/11 0138  . DISCONTD: heparin ADULT infusion 100 units/mL (25000 units/250 mL)  1,350 Units/hr Intravenous Continuous Deboraha Sprang, PHARMD 13.5 mL/hr at 12/20/11 1100 1,350 Units/hr at 12/20/11 1100    PE: General appearance: alert, cooperative and no distress Neck: incision site is erythematous with small amount of prulent drainage Lungs: Clear on the right.  Decreased BS at the left base and E to A changes noted. Heart: regular rate and rhythm, S1, S2 normal, no murmur, click, rub or gallop Extremities: no LEE Pulses: 2+ and symmetric  Lab Results:   Basename 12/21/11 0449 12/20/11 0500 12/19/11 1133  WBC 6.4 6.1 7.8  HGB 10.9* 11.0* 11.5*  HCT 32.7* 32.6* 34.4*  PLT 174 161 151   BMET  Basename 12/20/11 0500 12/19/11 1133  NA 136 135  K 4.1 3.7  CL 98 98  CO2 28 27  GLUCOSE 140* 119*  BUN 7 8  CREATININE 0.64 0.52  CALCIUM 8.8 9.2   PT/INR  Basename 12/19/11 1133  LABPROT 14.1  INR 1.07    Cholesterol  Basename 12/20/11 0825  CHOL 126   Cardiac Enzymes No components found with this basename: TROPONIN:3, CKMB:3  Studies/Results: Echo Left ventricle: The cavity size was normal. Systolic function was normal. The estimated ejection fraction was in the range of 50% to 55%. Moderate hypokinesis of the anteroseptal myocardium. Doppler parameters are consistent with abnormal left ventricular relaxation (grade 1 diastolic dysfunction). - Mitral valve: Calcified annulus. Mild regurgitation. - Left atrium: The atrium was mildly dilated. - Right atrium: The atrium was mildly dilated. - Atrial septum: No defect or patent foramen ovale was identified. - Pulmonary arteries: PA peak pressure: 58mm Hg (S). Impressions:  - The right ventricular systolic pressure was increased consistent with mild pulmonary hypertension.   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  New wall motion abnormality compare to previous echo:  Moderate hypokinesis of the anteroseptal myocardium. Restart IV nitro which was DC by RN yesterday at ~ 1600.  Continue to monitor Bypass incision site.  A course of Keflex may be needed.      LOS: 2 days    HAGER,BRYAN W 12/21/2011 7:50 AM  I have seen & examined Ms. Lucena this AM.  She seems to be stable post NSTEMI.  Intermittent CP overnight though. Renal function stable & troponin trending downward.   Her suture site does have some drainage - does not appear frankly purulent, but the site is erythematous & tender.  Not unreasonable to consider a course of Abx, but will notify Dr. Trula Slade for his opinion.  Per Dr. Gwenlyn Found - discussion with Dr. Trula Slade, we should be  fine with anticoagulation.  I think she does warrant cardiac catheterization.  Last cath was in 2002, with atretic LIMA-LAD at that time & with severe ostial LSCA disease is likely non-functional.  In 2002, the LAD was being perfused via the SVG-Diag & the other 2 grafts were patent.  For now, I think diagnostic catheterization is warranted, but if PCI to LAD is in question, may need to consider ROTAtional atherectomy as the vessel is likely calcified.  This procedure has been fully reviewed with the patient and written informed consent has been obtained.  Risks / Complications include, but not limited to: Death, MI, CVA/TIA, VF/VT (with defibrillation), Bradycardia (need for temporary pacer placement), contrast induced nephropathy, bleeding / bruising / hematoma / pseudoaneurysm, vascular or coronary injury (with possible emergent CT or Vascular Surgery), adverse medication reactions, infection.    The patient voiced understanding and agree to proceed.   I have signed the consent form and placed it on the chart for patient signature and RN witness.     Deanna [name], M.D., M.S. [facility] [address] [place], [place]  [address]  [phone]  12/21/2011 9:27 AM

## 2011-12-21 NOTE — CV Procedure (Addendum)
[facility]     CARDIAC CATHETERIZATION REPORT  Deanna KREKE   MRN: [id number] 07-31-1941   ADMIT DATE:  12/19/2011  Performing Cardiologist: Leonie Man Primary Physician: Donnajean Lopes, MD, MD Primary Cardiologist:  Lorretta Harp, MD.  Procedures Performed:  Left Heart Catheterization via 5 Fr Left Common Femoral Artery (below Fem-Fem bypass anastomosis) access  Left Ventriculography, (RAO) 12 ml/sec for 25 ml total contrast  Native Coronary Angiography  Saphenous Vein Graft Angiography  IC NTG Injection  Left Ilio-femoral angiography  Indication(s): NSTEMI  Known CAD - s/p CABG ~2001  Severe PAD, L-R Fem-Fem bypass & recent LCarotid-SCA bypass  History: 71 y.o. female with recent Left Carotid-SCA bypass for L-Subclavian steal syndrome with a significant CAD(CABG x 4) & PAD history who presented to [facility] ER yesterday with NSTEMI.  She is referred for diagnostic cardiac catheterization +/- PCI.  Consent: The procedure with Risks/Benefits/Alternatives and Indications was reviewed with the patient.  All questions were answered.    Risks / Complications include, but not limited to: Death, MI, CVA/TIA, VF/VT (with defibrillation), Bradycardia (need for temporary pacer placement), contrast induced nephropathy, bleeding / bruising / hematoma / pseudoaneurysm, vascular or coronary injury (with possible emergent CT or Vascular Surgery), adverse medication reactions, infection.    The patient voice understanding and agree to proceed.    Consent for signed by MD and patient with RN witness -- placed on chart.  Procedure: The patient was brought to the [facility] in the fasting state and prepped and draped in the usual sterile fashion for Left groin access. Sterile technique was used including antiseptics, cap, gloves, gown, hand hygiene, mask and sheet.  Skin prep: Chlorhexidine;  Time Out: Verified  patient identification, verified procedure, site/side was marked, verified correct patient position, special equipment/implants available, medications/allergies/relevent history reviewed, required imaging and test results available.  Performed  The left femoral head was identified using tactile and fluoroscopic technique.  The left groin was anesthetized with 1% subcutaneous Lidocaine.  The left Common Femoral Artery was accessed using the Modified Seldinger Technique under direct Fluroscopic guidance on the second puncture attempt -- with placement of a antimicrobial bonded/coated single lumen (5 Fr) sheath.  The sheath was aspirated and flushed.  A Versicvore wire was used to maneuver the wire into the external iliac artery avoiding the bypass graft.  A 5 Fr JL4 followed by JR4 Catheter was advanced of over a Versicore wire into the ascending Aorta.  The catheter was used to engage the Left and Right Coronary Artery as well as all 3 SVGs Multiple cineangiographic views of the the Native and Graft Coronary Artery system(s) were performed.   The JR4 catheter was then exchanged over the Standard J wire for an angled Pigtail catheter that was advanced across the Aortic Valve.  LV hemodynamics were measured and Left Ventriculography was performed.  LV hemodynamics were then re-sampled, and the catheter was pulled back across the Aortic Valve for measurement of "pull-back" gradient.  The catheter was pulled back into the Left Common Iliac Artery and a selective Left Ilio-femoral angiogram was performed to evaluate the graft & sheath location for sheath removal.  Then the catheter was removed completely out of the body.  The patient was transferred to the holding area where the sheath was removed after ACT checked with manual pressure held for hemostasis.    The patient was transported to the PACU holding area in a hemodynamically stable, chest  pain free condition.   The patient  was stable before, during and  following the procedure.   Patient did tolerate procedure well. There were not complications. EBL: ~10 ml  Medications:  Sedation:  1 mg IV Versed, 50 IV mcg Fentanyl  Contrast:  110 ml Omnipaque  IV Labetalol 10mg   Hemodynamics:  Central Aortic Pressure / Mean Aortic Pressure: 165/70 mmHg at beginning of case; at time of AoV Pullback - 154/59 mmHg; 104 mmhg  LV Pressure / LV End diastolic Pressure:  0000000 mmHg; 23 mmHg  Left Ventriculography:  EF:  50-55%  Wall Motion: Basal inferior moderate hypokinesis with mild basal/mid anterior hypokinesis.  Coronary Angiographic Data: Right Dominant  Left Main:  Large caliber, distal ~30% at Left Circumflex (LCx) Ostium; branches into LAD & LCx  Left Anterior Descending (LAD):  Moderate caliber vessel with sever proximal Septal trunks and 2 small diagonals; there is a smooth tapering 70-80% lesion at the take-off of the major 3rd diagonal; distal to this branch-point, there is competitive flow from the SVG-D3; the distal vessel courses around the apex providing collateral flow to the distal RPDA; diffuse mild luminal irregularities.  3rd diagonal (D3):  Ghosting antegrade flow from LAD due to brisk SVG flow; fills antegrade via SVG, diffuse luminal irregularities.  Circumflex (LCx):  100% occluded in the early mid-vessel; with very small AVGroove branch coming off from the occlusion site with ~80% ostial stenosis.  Large OM filled by SVG at a branch point, antegrade fills moderate caliber bifurcating vessel with minimal luminal irregularities; retrograde fills a small OMB before reaching main Circumflex  Right Coronary Artery: 100% occlusion in mid vessel; distal RPDA (with some retrograde filling of RPL system) filled via early LAD-SP & apical LAD to RPDA collaterals  Grafts  LIMA - LAD: known to be atretic with occluded Left SCA. - not imaged  SVG - DIAG: Widely patent with antegrade flow down Diag & retrograde fills LAD to apex with L-R  collaterals.  SVG - OM: Widely patent with ategrade flow to OM, minimal retrograde flow to native LCx.  SVG - RCA (RPDA/RPL): 100% thrombotic, grumous filled through 1/2 of coursel; Large readiopaque filling defect just prior to occlusion site.-- Not safe for PCI for fear of distal embolization.  Widely patent Left common and external Iliac with brisk flow through the L-R fem-fem bypass graft.  Impression:  Culprit lesion is most likely the apparently recently occluded SVG-RPDA; not amenable to PCI.  Widely patent SVG-D3 that actually provides brisk competitive flow to the distal LAD as noted in 2002.  Widely patent SVG-OM.  Severe mid LAD lesion that does not appear to be changed from 2002 catheterization, protected by SVG-D3.  Mostly preserved LV function with mild basal anterior, but more prominent basal inferior hypokinesis.  Widely patent Fem-Fem bypass graft.  Plan:  Transfer to telemetry for overnight monitoring & BP control  Can d/c heparin as SVG is occluded & unlikely to benefit from anticoagulation; however, will convert to SQ Lovenox for 24-48 hrs more coverage.  As discussed with Dr. Trula Slade, who will see the patient either later today or in the AM, he agrees with initiating oral Antibiotics for the concern over wound drainage.  Will need to aggressively titrate BP medications - increased ARB, next would increase Amlodipine.   The case and results was discussed with the patient (and family) -- multiple social concerns expressed by the daughter & only caregiver -- need FMLA paperwork signed.  Patient will most likely need  short term rehab at a facility, after which, the daughter would like to move her mother closer to her in [place], [place].  The case and results will be forwarded to the patient's PCP.  The case and results was discussed with the patient's Cardiologist.  Time Spend Directly with Patient:  60 minutes  Ashwin Tibbs W, M.D., M.S. [facility] [address] [place], [place]  [address]  [phone]  12/21/2011 12:15 PM

## 2011-12-21 NOTE — Progress Notes (Signed)
ANTICOAGULATION CONSULT NOTE - Follow Up Consult  Pharmacy Consult for heparin Indication: chest pain/ACS  Labs:  Basename 12/21/11 0449 12/20/11 1950 12/20/11 1502 12/20/11 0825 12/20/11 0520 12/20/11 0500 12/19/11 1133  HGB 10.9* -- -- -- -- 11.0* --  HCT 32.7* -- -- -- -- 32.6* 34.4*  PLT 174 -- -- -- -- 161 151  APTT -- -- -- -- -- -- 33  LABPROT -- -- -- -- -- -- 14.1  INR -- -- -- -- -- -- 1.07  HEPARINUNFRC 0.58 0.15* -- 0.28* -- -- --  CREATININE -- -- -- -- -- 0.64 0.52  CKTOTAL -- -- 41 45 53 -- --  CKMB -- -- 2.4 2.6 2.6 -- --  TROPONINI -- -- 1.25* 1.33* 1.22* -- --    Assessment/Plan: 71yo female now therapeutic on heparin after rate increases.  Will continue gtt at current rate and confirm stable with additional level.  Rogue Bussing PharmD BCPS 12/21/2011,6:20 AM

## 2011-12-22 LAB — RETICULOCYTES
RBC.: 4.05 MIL/uL (ref 3.87–5.11)
Retic Count, Absolute: 93.2 10*3/uL (ref 19.0–186.0)
Retic Ct Pct: 2.3 % (ref 0.4–3.1)

## 2011-12-22 LAB — FOLATE: Folate: 16.2 ng/mL

## 2011-12-22 LAB — IRON AND TIBC: Saturation Ratios: 11 % — ABNORMAL LOW (ref 20–55)

## 2011-12-22 MED ORDER — DOXYCYCLINE HYCLATE 100 MG PO TABS
100.0000 mg | ORAL_TABLET | Freq: Two times a day (BID) | ORAL | Status: DC
  Administered 2011-12-22 – 2011-12-29 (×14): 100 mg via ORAL
  Filled 2011-12-22 (×16): qty 1

## 2011-12-22 MED ORDER — DEXTROSE 5 % IV SOLN
1.0000 g | INTRAVENOUS | Status: DC
  Administered 2011-12-22 – 2011-12-29 (×8): 1 g via INTRAVENOUS
  Filled 2011-12-22 (×9): qty 10

## 2011-12-22 MED ORDER — FERROUS SULFATE 325 (65 FE) MG PO TABS
325.0000 mg | ORAL_TABLET | Freq: Every day | ORAL | Status: DC
  Administered 2011-12-24 – 2011-12-29 (×6): 325 mg via ORAL
  Filled 2011-12-22 (×8): qty 1

## 2011-12-22 MED ORDER — NITROGLYCERIN 0.4 MG SL SUBL
SUBLINGUAL_TABLET | SUBLINGUAL | Status: AC
  Filled 2011-12-22: qty 25

## 2011-12-22 MED ORDER — LORAZEPAM 0.5 MG PO TABS
0.5000 mg | ORAL_TABLET | Freq: Once | ORAL | Status: AC
  Administered 2011-12-22: 0.5 mg via ORAL
  Filled 2011-12-22: qty 1

## 2011-12-22 NOTE — Evaluation (Signed)
Occupational Therapy Evaluation Patient Details Name: Deanna [name] MRN: [id number] DOB: [birth year] Today's Date: 12/22/2011 Time: [id number] (11:[id number] first attempt with cardiac rehab) OT Time Calculation (min): 18 min  OT Assessment / Plan / Recommendation Clinical Impression  71 yo female admitted for left carotid to subclavian bypass and was re-admitted with chest pain and elevated troponins suggestive of an MI. Troponin levels are now trending down. We will follow patient acutely to ensure that she can maximize her functional activity tolerance and efficiency to enable transition home in the long term.     OT Assessment  All further OT needs can be met in the next venue of care    Follow Up Recommendations  Skilled nursing facility    Barriers to Discharge      Equipment Recommendations  Defer to next venue              Precautions / Restrictions Precautions Precautions: Fall   Pertinent Vitals/Pain None    ADL  Eating/Feeding: Performed;Supervision/safety Where Assessed - Eating/Feeding: Chair Grooming: Performed;Wash/dry hands;Supervision/safety Where Assessed - Grooming: Unsupported standing Lower Body Dressing: Minimal assistance Where Assessed - Lower Body Dressing: Unsupported sitting Toilet Transfer: Performed;Supervision/safety Toilet Transfer Method: Sit to Loss adjuster, chartered: Regular height toilet Toileting - Clothing Manipulation and Hygiene: Performed;Supervision/safety Where Assessed - Best boy and Hygiene: Sit to stand from 3-in-1 or toilet Transfers/Ambulation Related to ADLs: Pt finishing with cardiac rehab and sitting EOB on arrival. Pt completed bed mobility, ambulated to bathroom,. completed grooming at sink level and ambulated to chair with dyspnea 2 out 4 decreased activity tolerance. Pt could benefit from SNF to increase endurance and have AE provided to patient with education. Pt unable to reach feet due to  SOB with hip flexion. pt educated on energy conservation for bathing and dressing, bathing and benefit of AE for adls. Pt is a previous cardiac patient and has attended energy conservation class. Pt ambulating at S level due to SOB with ambulation.  ADL Comments: Pt can complete ADLs with increased time but could benefit from SNF level OT to increase independence / activity tolerance        Visit Information  Last OT Received On: 12/22/11 Assistance Needed: +1    Subjective Data  Subjective: "I love those shoes. Where can I get some?" Patient Stated Goal: to go to SNF for 1-2 weeks in [place] near daughter to get my strength back   Prior [place] Lives With: Alone Type of Home:  (condo) Home Access: Level entry Home Layout: One level Bathroom Shower/Tub: Chiropodist: Standard Home Adaptive Equipment: Grab bars in shower;Grab bars around toilet;Shower chair without back;Walker - rolling;Bedside commode/3-in-1 Prior Function Level of Independence: Independent Driving: Yes Vocation: 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 Dominant Hand: Right    Cognition  Overall Cognitive Status: Appears within functional limits for tasks assessed/performed Arousal/Alertness: Awake/alert Orientation Level: Oriented X4 / Intact Behavior During Session: WFL for tasks performed    Extremity/Trunk Assessment Right Upper Extremity Assessment RUE ROM/Strength/Tone: Within functional levels RUE Sensation: WFL - Light Touch RUE Coordination: WFL - gross/fine motor Left Upper Extremity Assessment LUE ROM/Strength/Tone: Within functional levels LUE Sensation: WFL - Light Touch LUE Coordination: WFL - gross/fine motor Trunk Assessment Trunk Assessment: Normal   Mobility Bed Mobility Bed Mobility: Rolling Right;Supine to Sit Rolling Right: 7: Independent Supine to Sit: 7: Independent Transfers Transfers: Sit to Stand;Stand to Sit Sit to Stand: 7:  Independent;Without upper extremity assist;From bed Stand  to Sit: 7: Independent;Without upper extremity assist;To bed   Exercise    Balance    End of Session OT - End of Session Activity Tolerance: Patient tolerated treatment well Patient left: in chair;with call bell/phone within reach Nurse Communication: Mobility status   Sharol Harness [facility] 12/22/2011, 2:14 PM Pager: [phone]

## 2011-12-22 NOTE — Progress Notes (Signed)
Subjective: Feels good today, only having mild right low back pain. Has mild dyspnea with exertion.   Objective: Vital signs in last 24 hours: Temp:  [97.6 F (36.4 C)-98.2 F (36.8 C)] 98.1 F (36.7 C) (05/16 0500) Pulse Rate:  [76-86] 78  (05/16 0500) Resp:  [18-23] 18  (05/16 0500) BP: (120-161)/(50-94) 149/82 mmHg (05/16 0500) SpO2:  [95 %-97 %] 97 % (05/16 0500) Weight change:    Intake/Output from previous day: 05/15 0701 - 05/16 0700 In: 581.6 [P.O.:360; I.V.:221.6] Out: -    General appearance: alert, cooperative and no distress Resp: clear to auscultation bilaterally Cardio: regular rate and rhythm, S1, S2 normal, no murmur, click, rub or gallop Extremities: extremities normal, atraumatic, no cyanosis or edema  Lab Results:  Basename 12/21/11 0449 12/20/11 0500  WBC 6.4 6.1  HGB 10.9* 11.0*  HCT 32.7* 32.6*  PLT 174 161   BMET  Basename 12/20/11 0500 12/19/11 1133  NA 136 135  K 4.1 3.7  CL 98 98  CO2 28 27  GLUCOSE 140* 119*  BUN 7 8  CREATININE 0.64 0.52  CALCIUM 8.8 9.2   CMET CMP     Component Value Date/Time   NA 136 12/20/2011 0500   K 4.1 12/20/2011 0500   CL 98 12/20/2011 0500   CO2 28 12/20/2011 0500   GLUCOSE 140* 12/20/2011 0500   BUN 7 12/20/2011 0500   CREATININE 0.64 12/20/2011 0500   CALCIUM 8.8 12/20/2011 0500   PROT 6.5 12/20/2011 0500   ALBUMIN 3.2* 12/20/2011 0500   AST 24 12/20/2011 0500   ALT 9 12/20/2011 0500   ALKPHOS 73 12/20/2011 0500   BILITOT 0.7 12/20/2011 0500   GFRNONAA 88* 12/20/2011 0500   GFRAA >90 12/20/2011 0500    CBG (last 3)  No results found for this basename: GLUCAP:3 in the last 72 hours  INR RESULTS:   Lab Results  Component Value Date   INR 1.07 12/19/2011   INR 1.03 12/08/2011   INR 1.03 06/02/2011     Studies/Results: No results found.  Medications: I have reviewed the patient's current medications.  Assessment/Plan: #1 Pneumonia:  Stable, and will change to IV Rocephin with doxycycline for staph  coverage. Likely discharge to SNF tomorrow.  #2 Anemia:  Stable with lab workup sent this morning. Will start iron replacement. #3 CAD:  Stable following cath yesterday.  LOS: 3 days   Deanna [name] G 12/22/2011, 8:06 AM

## 2011-12-22 NOTE — Progress Notes (Signed)
OT Discharge Note  Patient is being discharged from OT services secondary to:    Goals met and no further therapy needs identified.  Please see latest Therapy Progress Note for current level of functioning and progress toward goals.  Progress and discharge plan and discussed with patient/caregiver and they    Agree   Defer to SNF all further education    Deanna [name]   OTR/L Pager: [phone] Office: [phone] .

## 2011-12-22 NOTE — Progress Notes (Addendum)
CSW is continuing to follow to assist with pt dc plans to [place] is aware of patient anticipated discharge for tomorrow, 12/23/2011 per chart review. CSW confirmed pt dc plans with facility. Facility confirmed insurance authorization.  .Clinical social worker continuing to follow pt to assist with pt dc plans and further csw needs.   Dorathy Kinsman, [place] .12/22/2011 10:06am

## 2011-12-22 NOTE — Progress Notes (Signed)
CARDIAC REHAB PHASE I   PRE:  Rate/Rhythm: 79SR  BP:  Supine: 138/86  Sitting:   Standing:    SaO2:   MODE:  Ambulation: 120 ft   POST:  Rate/Rhythem: 93SR  BP:  Supine:   Sitting: 154/90  Standing:    SaO2: 90%RA 1128-1210 Pt walked 120 ft on RA with asst x 1. Fairly steady. A little wobbly. Denied CP. Tired by end of walk. Education completed. Permission given to refer to [facility] Phase 2. OT in to work with pt.  Jeani Sow

## 2011-12-22 NOTE — Progress Notes (Signed)
[facility]  DAILY PROGRESS NOTE   Subjective:  Cath yesterday shows newly occluded SVG to PDA graft. Seems to be improving with antibiotic therapy.  Low normal ejection fraction by ventriculography which agrees with echocardiogram - there is an old anterior wall motion abnormality.  Denies chest pain. Cath site looks good.  Objective:  Temp:  [97.6 F (36.4 C)-98.2 F (36.8 C)] 98.1 F (36.7 C) (05/16 0500) Pulse Rate:  [76-86] 78  (05/16 0500) Resp:  [18-23] 18  (05/16 0500) BP: (120-161)/(50-94) 149/82 mmHg (05/16 0500) SpO2:  [95 %-97 %] 97 % (05/16 0500) Weight change:   Intake/Output from previous day: 05/15 0701 - 05/16 0700 In: 581.6 [P.O.:360; I.V.:221.6] Out: -   Intake/Output from this shift:    Medications: Current Facility-Administered Medications  Medication Dose Route Frequency Provider Last Rate Last Dose  . 0.9 %  sodium chloride infusion  1 mL/kg/hr Intravenous Continuous Leonie Man, MD 93.4 mL/hr at 12/21/11 1335 1 mL/kg/hr at 12/21/11 1335  . 0.9 %  sodium chloride infusion  250 mL Intravenous Continuous Leonie Man, MD      . acetaminophen (TYLENOL) tablet 650 mg  650 mg Oral Q6H PRN Donnajean Lopes, MD       Or  . acetaminophen (TYLENOL) suppository 650 mg  650 mg Rectal Q6H PRN Donnajean Lopes, MD      . amLODipine (NORVASC) tablet 5 mg  5 mg Oral Daily Donnajean Lopes, MD   5 mg at 12/21/11 0913  . aspirin chewable tablet 81 mg  81 mg Oral Daily Pixie Casino, MD      . bisacodyl (DULCOLAX) suppository 10 mg  10 mg Rectal Daily PRN Donnajean Lopes, MD      . cefTRIAXone (ROCEPHIN) 1 g in dextrose 5 % 50 mL IVPB  1 g Intravenous Q24H Donnajean Lopes, MD      . clopidogrel (PLAVIX) tablet 75 mg  75 mg Oral Daily Donnajean Lopes, MD   75 mg at 12/21/11 0913  . doxycycline (VIBRA-TABS) tablet 100 mg  100 mg Oral Q12H Donnajean Lopes, MD      . fentaNYL (SUBLIMAZE) 0.05 MG/ML injection           .  ferrous sulfate tablet 325 mg  325 mg Oral Q breakfast Donnajean Lopes, MD      . heparin 2-0.9 UNIT/ML-% infusion           . irbesartan (AVAPRO) tablet 150 mg  150 mg Oral Daily Leonie Man, MD      . labetalol (NORMODYNE,TRANDATE) 5 MG/ML injection           . levothyroxine (SYNTHROID, LEVOTHROID) tablet 175 mcg  175 mcg Oral Daily Donnajean Lopes, MD   175 mcg at 12/21/11 0912  . lidocaine (XYLOCAINE) 1 % injection           . LORazepam (ATIVAN) 2 MG/ML injection           . LORazepam (ATIVAN) tablet 0.5 mg  0.5 mg Oral Q12H PRN Tarri Fuller, PA   0.5 mg at 12/21/11 1759  . metoprolol (LOPRESSOR) tablet 50 mg  50 mg Oral BID Donnajean Lopes, MD   50 mg at 12/21/11 2146  . midazolam (VERSED) 2 MG/2ML injection           . nitroGLYCERIN (NTG ON-CALL) 0.2 mg/mL injection           .  ondansetron (ZOFRAN) injection 4 mg  4 mg Intravenous Q6H PRN Leonie Man, MD      . oxyCODONE-acetaminophen (PERCOCET) 5-325 MG per tablet 1 tablet  1 tablet Oral Q6H PRN Donnajean Lopes, MD      . pantoprazole (PROTONIX) EC tablet 40 mg  40 mg Oral Q1200 Donnajean Lopes, MD   40 mg at 12/20/11 1204  . promethazine (PHENERGAN) tablet 12.5 mg  12.5 mg Oral Q6H PRN Donnajean Lopes, MD      . rosuvastatin (CRESTOR) tablet 10 mg  10 mg Oral q1800 Donnajean Lopes, MD   10 mg at 12/21/11 1759  . senna-docusate (Senokot-S) tablet 1 tablet  1 tablet Oral QHS PRN Donnajean Lopes, MD      . sertraline (ZOLOFT) tablet 100 mg  100 mg Oral Daily Donnajean Lopes, MD   100 mg at 12/21/11 0912  . sertraline (ZOLOFT) tablet 50 mg  50 mg Oral QHS Donnajean Lopes, MD   50 mg at 12/21/11 2146  . sodium chloride 0.9 % injection 3 mL  3 mL Intravenous Q12H Leonie Man, MD      . sodium chloride 0.9 % injection 3 mL  3 mL Intravenous PRN Leonie Man, MD      . vitamin B-12 (CYANOCOBALAMIN) tablet 1,000 mcg  1,000 mcg Oral Daily Donnajean Lopes, MD   1,000 mcg at 12/21/11 0913  . zolpidem (AMBIEN)  tablet 5 mg  5 mg Oral QHS PRN Donnajean Lopes, MD   5 mg at 12/21/11 2146  . DISCONTD: 0.9 %  sodium chloride infusion  250 mL Intravenous PRN Donnajean Lopes, MD   10 mL at 12/20/11 2206  . DISCONTD: 0.9 %  sodium chloride infusion   Intravenous Continuous Donnajean Lopes, MD 10 mL/hr at 12/19/11 2300    . DISCONTD: 0.9 %  sodium chloride infusion  250 mL Intravenous PRN Tarri Fuller, PA      . DISCONTD: 0.9 %  sodium chloride infusion   Intravenous Continuous Lorretta Harp, MD 93.3 mL/hr at 12/21/11 0700    . DISCONTD: alum & mag hydroxide-simeth (MAALOX/MYLANTA) 200-200-20 MG/5ML suspension 30 mL  30 mL Oral Q6H PRN Donnajean Lopes, MD      . DISCONTD: atorvastatin (LIPITOR) tablet 20 mg  20 mg Oral q1800 Donnajean Lopes, MD   20 mg at 12/20/11 1819  . DISCONTD: cephALEXin (KEFLEX) capsule 500 mg  500 mg Oral QID Leonie Man, MD   500 mg at 12/21/11 2229  . DISCONTD: heparin ADULT infusion 100 units/mL (25000 units/250 mL)  1,600 Units/hr Intravenous Continuous Donnajean Lopes, MD 16 mL/hr at 12/21/11 0700 1,600 Units/hr at 12/21/11 0700  . DISCONTD: irbesartan (AVAPRO) tablet 75 mg  75 mg Oral Daily Donnajean Lopes, MD   75 mg at 12/21/11 0913  . DISCONTD: LORazepam (ATIVAN) tablet 0.5 mg  0.5 mg Oral QID Donnajean Lopes, MD   0.5 mg at 12/21/11 0647  . DISCONTD: nitroGLYCERIN 0.2 mg/mL in dextrose 5 % infusion  2-200 mcg/min Intravenous Titrated Tarri Fuller, PA   10 mcg/min at 12/21/11 0800  . DISCONTD: sodium chloride 0.9 % injection 3 mL  3 mL Intravenous Q12H Donnajean Lopes, MD   3 mL at 12/21/11 0914  . DISCONTD: sodium chloride 0.9 % injection 3 mL  3 mL Intravenous Q12H Donnajean Lopes, MD      . DISCONTD: sodium chloride 0.9 %  injection 3 mL  3 mL Intravenous PRN Donnajean Lopes, MD      . DISCONTD: sodium chloride 0.9 % injection 3 mL  3 mL Intravenous Q12H Tarri Fuller, PA   3 mL at 12/20/11 2150  . DISCONTD: sodium chloride 0.9 % injection 3 mL  3 mL  Intravenous PRN Tarri Fuller, PA        Physical Exam: General appearance: alert and no distress Neck: no adenopathy, no carotid bruit, no JVD, supple, symmetrical, trachea midline and thyroid not enlarged, symmetric, no tenderness/mass/nodules Lungs: clear to auscultation bilaterally Heart: regular rate and rhythm, S1, S2 normal, no murmur, click, rub or gallop Abdomen: soft, non-tender; bowel sounds normal; no masses,  no organomegaly Extremities: extremities normal, atraumatic, no cyanosis or edema and groin without hematoma or bruit Pulses: 2+ and symmetric  Lab Results: Results for orders placed during the hospital encounter of 12/19/11 (from the past 48 hour(s))  CARDIAC PANEL(CRET KIN+CKTOT+MB+TROPI)     Status: Abnormal   Collection Time   12/20/11  3:02 PM      Component Value Range Comment   Total CK 41  7 - 177 (U/L)    CK, MB 2.4  0.3 - 4.0 (ng/mL)    Troponin I 1.25 (*) <0.30 (ng/mL)    Relative Index RELATIVE INDEX IS INVALID  0.0 - 2.5    HEPARIN LEVEL (UNFRACTIONATED)     Status: Abnormal   Collection Time   12/20/11  7:50 PM      Component Value Range Comment   Heparin Unfractionated 0.15 (*) 0.30 - 0.70 (IU/mL)   CBC     Status: Abnormal   Collection Time   12/21/11  4:49 AM      Component Value Range Comment   WBC 6.4  4.0 - 10.5 (K/uL)    RBC 3.96  3.87 - 5.11 (MIL/uL)    Hemoglobin 10.9 (*) 12.0 - 15.0 (g/dL)    HCT 32.7 (*) 36.0 - 46.0 (%)    MCV 82.6  78.0 - 100.0 (fL)    MCH 27.5  26.0 - 34.0 (pg)    MCHC 33.3  30.0 - 36.0 (g/dL)    RDW 14.3  11.5 - 15.5 (%)    Platelets 174  150 - 400 (K/uL)   HEPARIN LEVEL (UNFRACTIONATED)     Status: Normal   Collection Time   12/21/11  4:49 AM      Component Value Range Comment   Heparin Unfractionated 0.58  0.30 - 0.70 (IU/mL)   POCT ACTIVATED CLOTTING TIME     Status: Normal   Collection Time   12/21/11 11:15 AM      Component Value Range Comment   Activated Clotting Time 149     RETICULOCYTES     Status:  Normal   Collection Time   12/22/11  6:20 AM      Component Value Range Comment   Retic Ct Pct 2.3  0.4 - 3.1 (%)    RBC. 4.05  3.87 - 5.11 (MIL/uL)    Retic Count, Manual 93.2  19.0 - 186.0 (K/uL)     Imaging: No results found.  Assessment:  1. Principal Problem: 2.  *Shortness of breath 3. Active Problems: 4.  HYPOTHYROIDISM 5.  OBESITY, MODERATE 6.  ANXIETY DEPRESSION 7.  HYPERTENSION 8.  CAD 9.  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  Pleural effusion, left 12.  NSTEMI (non-ST elevated myocardial infarction) 13.  PNA (pneumonia) 14.  Plan:  1. Doing well with subacute occlusion of the SVG to PDA. Medical therapy is recommended for this. The subclavian wound appears to be healing. Plan per rehab per Dr. Philip Aspen. She is on aspirin and plavix, as well as crestor, amlodipine, and avapro, which is a good cardiovascular regimen. Will arrange follow-up in our office with Dr. [name] and sign-off. Call with questions.   Time Spent Directly with Patient:  15 minutes  Length of Stay:  LOS: 3 days   Pixie Casino, MD, [facility] Attending Cardiologist [facility] [place] [facility] 12/22/2011, 8:36 AM

## 2011-12-22 NOTE — Evaluation (Signed)
Physical Therapy Evaluation Patient Details Name: Deanna [name] MRN: [id number] DOB: 10/28/1940 Today's Date: 12/22/2011 Time: [id number] PT Time Calculation (min): 20 min  PT Assessment / Plan / Recommendation Clinical Impression  Patient presents post recent left carotid to subclavian bypass and was re-admitted with chest pain and elevated troponins suggestive of an MI. Troponin levels are now trending down. We will follow patient acutely to ensure that she can maximize her functional activity tolerance and efficiency to enable transition home in the long term.     PT Assessment  Patient needs continued PT services    Follow Up Recommendations  Skilled nursing facility    Barriers to Discharge  Limited caregiver support for D/C home      lEquipment Recommendations  Defer to next venue    Recommendations for Other Services  None  Frequency Min 3X/week    Precautions / Restrictions Precautions Precautions: Fall   Pertinent Vitals/Pain VSS/ reports right rib area pain with rolling.      Mobility  Bed Mobility Bed Mobility: Rolling Right;Right Sidelying to Sit;Sitting - Scoot to Edge of Bed;Sit to Supine Rolling Right: 7: Independent Right Sidelying to Sit: 7: Independent Sitting - Scoot to Edge of Bed: 7: Independent Sit to Supine: 7: Independent Transfers Transfers: Sit to Stand;Stand to Sit Sit to Stand: 7: Independent;Without upper extremity assist;From bed Stand to Sit: 7: Independent;Without upper extremity assist;To bed Ambulation/Gait Ambulation/Gait Assistance: 4: Min guard Ambulation Distance (Feet): 100 Feet Assistive device: None Ambulation/Gait Assistance Details: Mild DOE 2/4, but VSS. Patient fatigues very quickly due to low activity tolerance. Speed of gait 1.1 feet/second - indicative of recurrent falls.     PT Diagnosis: Difficulty walking  PT Problem List: Decreased activity tolerance;Decreased mobility;Cardiopulmonary status limiting activity PT  Treatment Interventions: Gait training;Functional mobility training;Therapeutic activities;Therapeutic exercise;Patient/family education   PT Goals Acute Rehab PT Goals PT Goal Formulation: With patient Time For Goal Achievement: 12/29/11 Potential to Achieve Goals: Good Pt will Ambulate: >150 feet;Independently;with gait velocity >(comment) ft/second (> 1.8 feet/second to indicate decr. falls risk) PT Goal: Ambulate - Progress: Goal set today Additional Goals Additional Goal #1: Timed up and go will be completed within 13 seconds to indicate improved functional efficiency and decr.  falls risk PT Goal: Additional Goal #1 - Progress: Goal set today  Visit Information  Last PT Received On: 12/22/11 Assistance Needed: +1    Subjective Data  Subjective: Patient reports concerns regarding discharge home as limited assistance available Patient Stated Goal: Rehab facility for short duration   Prior Functioning  Home Living Lives With: Alone Type of Home:  (condo) Home Access: Level entry Home Layout: One level Bathroom Shower/Tub: Chiropodist: Standard Home Adaptive Equipment: Grab bars in shower;Grab bars around toilet;Shower chair without back;Walker - rolling;Bedside commode/3-in-1 Prior Function Level of Independence: Independent Driving: Yes Vocation: 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    Cognition  Overall Cognitive Status: Appears within functional limits for tasks assessed/performed Arousal/Alertness: Awake/alert Orientation Level: Oriented X4 / Intact Behavior During Session:  (some child like behavior)    Extremity/Trunk Assessment Right Lower Extremity Assessment RLE ROM/Strength/Tone: WFL for tasks assessed RLE Sensation: WFL - Light Touch;WFL - Proprioception RLE Coordination: WFL - gross/fine motor Left Lower Extremity Assessment LLE ROM/Strength/Tone: WFL for tasks assessed LLE Sensation: WFL - Light Touch;WFL -  Proprioception LLE Coordination: WFL - gross/fine motor Trunk Assessment Trunk Assessment: Normal   Balance Balance Balance Assessed: Yes Static Sitting Balance Static Sitting - Balance Support: No upper extremity  supported;Feet supported Static Sitting - Level of Assistance: 7: Independent Dynamic Sitting Balance Dynamic Sitting - Balance Support: No upper extremity supported;Feet supported Dynamic Sitting - Level of Assistance: 7: Independent Dynamic Sitting - Balance Activities: Reaching for objects;Reaching across midline Static Standing Balance Static Standing - Balance Support: No upper extremity supported Static Standing - Level of Assistance: 7: Independent  End of Session PT - End of Session Equipment Utilized During Treatment: Gait belt Activity Tolerance: Patient limited by fatigue Patient left: in bed;with call bell/phone within reach;with bed alarm set Nurse Communication: Mobility status   Jake Bathe, PT  Pager [phone]  12/22/2011, 9:10 AM

## 2011-12-22 NOTE — Progress Notes (Signed)
1640- Pt states having pressure midsternum up to neck, rates discomfort 6/10. 1 SL NTG given. BP 156/78, HR 75. Sats 95% on RA. Placed 2l oxygen for comfort.   1645- Rates discomfort  2/10, BP 138/81 HR 75. Declines 2nd SL NTG. Pt talking without distress. Feels like she is smothering, unsure if some of it is anxiety. C/O discomfort in back, chest and tightness through out. Will notify [place].

## 2011-12-23 ENCOUNTER — Inpatient Hospital Stay (HOSPITAL_COMMUNITY): Payer: Medicare Other

## 2011-12-23 ENCOUNTER — Inpatient Hospital Stay (HOSPITAL_COMMUNITY): Payer: Medicare Other | Admitting: Anesthesiology

## 2011-12-23 ENCOUNTER — Encounter (HOSPITAL_COMMUNITY)
Admission: EM | Disposition: A | Discharge status: Skilled Nursing Facility | Payer: Self-pay | Source: Home / Self Care | Attending: Surgery

## 2011-12-23 ENCOUNTER — Encounter (HOSPITAL_COMMUNITY): Payer: Self-pay | Admitting: Anesthesiology

## 2011-12-23 ENCOUNTER — Encounter (HOSPITAL_COMMUNITY): Payer: Self-pay | Admitting: Radiology

## 2011-12-23 ENCOUNTER — Encounter: Payer: Self-pay | Admitting: Surgery

## 2011-12-23 DIAGNOSIS — T827XXA Infection and inflammatory reaction due to other cardiac and vascular devices, implants and grafts, initial encounter: Secondary | ICD-10-CM

## 2011-12-23 LAB — GRAM STAIN

## 2011-12-23 SURGERY — WOUND EXPLORATION
Anesthesia: General | Site: Neck | Laterality: Left | Wound class: Clean Contaminated

## 2011-12-23 MED ORDER — ONDANSETRON HCL 4 MG/2ML IJ SOLN
INTRAMUSCULAR | Status: DC | PRN
  Administered 2011-12-23: 4 mg via INTRAVENOUS

## 2011-12-23 MED ORDER — LACTATED RINGERS IV SOLN
INTRAVENOUS | Status: DC | PRN
  Administered 2011-12-23: 11:00:00 via INTRAVENOUS

## 2011-12-23 MED ORDER — ROCURONIUM BROMIDE 100 MG/10ML IV SOLN
INTRAVENOUS | Status: DC | PRN
  Administered 2011-12-23: 10 mg via INTRAVENOUS
  Administered 2011-12-23: 30 mg via INTRAVENOUS

## 2011-12-23 MED ORDER — FENTANYL CITRATE 0.05 MG/ML IJ SOLN
INTRAMUSCULAR | Status: DC | PRN
  Administered 2011-12-23: 50 ug via INTRAVENOUS
  Administered 2011-12-23: 25 ug via INTRAVENOUS
  Administered 2011-12-23: 100 ug via INTRAVENOUS

## 2011-12-23 MED ORDER — HYDRALAZINE HCL 20 MG/ML IJ SOLN
5.0000 mg | Freq: Once | INTRAMUSCULAR | Status: AC
  Administered 2011-12-23: 5 mg via INTRAVENOUS
  Filled 2011-12-23: qty 0.25

## 2011-12-23 MED ORDER — LIDOCAINE HCL 4 % MT SOLN
OROMUCOSAL | Status: DC | PRN
  Administered 2011-12-23: 4 mL via TOPICAL

## 2011-12-23 MED ORDER — PHENYLEPHRINE HCL 10 MG/ML IJ SOLN
10.0000 mg | INTRAVENOUS | Status: DC | PRN
  Administered 2011-12-23: 20 ug/min via INTRAVENOUS

## 2011-12-23 MED ORDER — MORPHINE SULFATE 2 MG/ML IJ SOLN
INTRAMUSCULAR | Status: AC
  Administered 2011-12-23: 1 mg via INTRAVENOUS
  Filled 2011-12-23: qty 1

## 2011-12-23 MED ORDER — NITROGLYCERIN IN D5W 200-5 MCG/ML-% IV SOLN
INTRAVENOUS | Status: DC | PRN
  Administered 2011-12-23: 5 ug/min via INTRAVENOUS

## 2011-12-23 MED ORDER — GLYCOPYRROLATE 0.2 MG/ML IJ SOLN
INTRAMUSCULAR | Status: DC | PRN
  Administered 2011-12-23: 0.6 mg via INTRAVENOUS

## 2011-12-23 MED ORDER — PROPOFOL 10 MG/ML IV EMUL
INTRAVENOUS | Status: DC | PRN
  Administered 2011-12-23: 150 mg via INTRAVENOUS

## 2011-12-23 MED ORDER — IOHEXOL 350 MG/ML SOLN
50.0000 mL | Freq: Once | INTRAVENOUS | Status: AC | PRN
  Administered 2011-12-23: 50 mL via INTRAVENOUS

## 2011-12-23 MED ORDER — SODIUM CHLORIDE 0.9 % IR SOLN
Status: DC | PRN
  Administered 2011-12-23: 11:00:00

## 2011-12-23 MED ORDER — ENOXAPARIN SODIUM 40 MG/0.4ML ~~LOC~~ SOLN
40.0000 mg | SUBCUTANEOUS | Status: DC
  Administered 2011-12-24 – 2011-12-28 (×5): 40 mg via SUBCUTANEOUS
  Filled 2011-12-23 (×6): qty 0.4

## 2011-12-23 MED ORDER — METOPROLOL TARTRATE 1 MG/ML IV SOLN
INTRAVENOUS | Status: DC | PRN
  Administered 2011-12-23: 1 mg via INTRAVENOUS
  Administered 2011-12-23: 3 mg via INTRAVENOUS
  Administered 2011-12-23: 1 mg via INTRAVENOUS

## 2011-12-23 MED ORDER — LORAZEPAM 0.5 MG PO TABS
0.5000 mg | ORAL_TABLET | ORAL | Status: DC | PRN
  Administered 2011-12-23 – 2011-12-29 (×11): 0.5 mg via ORAL
  Filled 2011-12-23 (×11): qty 1

## 2011-12-23 MED ORDER — NEOSTIGMINE METHYLSULFATE 1 MG/ML IJ SOLN
INTRAMUSCULAR | Status: DC | PRN
  Administered 2011-12-23: 4 mg via INTRAVENOUS

## 2011-12-23 MED ORDER — 0.9 % SODIUM CHLORIDE (POUR BTL) OPTIME
TOPICAL | Status: DC | PRN
  Administered 2011-12-23: 2000 mL

## 2011-12-23 MED ORDER — ESMOLOL HCL 10 MG/ML IV SOLN
INTRAVENOUS | Status: DC | PRN
  Administered 2011-12-23: 10 mg via INTRAVENOUS

## 2011-12-23 MED ORDER — MORPHINE SULFATE 2 MG/ML IJ SOLN
1.0000 mg | INTRAMUSCULAR | Status: DC | PRN
  Administered 2011-12-23 (×2): 1 mg via INTRAVENOUS
  Administered 2011-12-23: 2 mg via INTRAVENOUS

## 2011-12-23 MED ORDER — SODIUM CHLORIDE 0.9 % IV SOLN
INTRAVENOUS | Status: DC
  Administered 2011-12-24: 02:00:00 via INTRAVENOUS

## 2011-12-23 MED ORDER — HEMOSTATIC AGENTS (NO CHARGE) OPTIME
TOPICAL | Status: DC | PRN
  Administered 2011-12-23: 1 via TOPICAL

## 2011-12-23 MED ORDER — HYDROMORPHONE HCL PF 1 MG/ML IJ SOLN
0.5000 mg | INTRAMUSCULAR | Status: DC | PRN
  Administered 2011-12-24 (×2): 1 mg via INTRAVENOUS
  Filled 2011-12-23 (×2): qty 1

## 2011-12-23 SURGICAL SUPPLY — 58 items
ADH SKN CLS APL DERMABOND .7 (GAUZE/BANDAGES/DRESSINGS) ×1
CANISTER SUCTION 2500CC (MISCELLANEOUS) ×3 IMPLANT
CATH ROBINSON RED A/P 18FR (CATHETERS) IMPLANT
CATH SUCT 10FR WHISTLE TIP (CATHETERS) IMPLANT
CLIP TI MEDIUM 24 (CLIP) ×3 IMPLANT
CLIP TI WIDE RED SMALL 24 (CLIP) ×3 IMPLANT
CLOTH BEACON ORANGE TIMEOUT ST (SAFETY) ×3 IMPLANT
COVER SURGICAL LIGHT HANDLE (MISCELLANEOUS) ×6 IMPLANT
CRADLE DONUT ADULT HEAD (MISCELLANEOUS) ×3 IMPLANT
DERMABOND ADVANCED (GAUZE/BANDAGES/DRESSINGS) ×1
DERMABOND ADVANCED .7 DNX12 (GAUZE/BANDAGES/DRESSINGS) ×2 IMPLANT
DRAIN CHANNEL 15F RND FF W/TCR (WOUND CARE) ×3 IMPLANT
DRAPE INCISE IOBAN 66X45 STRL (DRAPES) ×3 IMPLANT
DRAPE PROXIMA HALF (DRAPES) ×3 IMPLANT
DRAPE WARM FLUID 44X44 (DRAPE) ×3 IMPLANT
ELECT REM PT RETURN 9FT ADLT (ELECTROSURGICAL) ×3
ELECTRODE REM PT RTRN 9FT ADLT (ELECTROSURGICAL) ×2 IMPLANT
EVACUATOR SILICONE 100CC (DRAIN) ×3 IMPLANT
GLOVE BIO SURGEON STRL SZ 6.5 (GLOVE) ×3 IMPLANT
GLOVE BIOGEL PI IND STRL 6.5 (GLOVE) ×2 IMPLANT
GLOVE BIOGEL PI IND STRL 7.0 (GLOVE) ×2 IMPLANT
GLOVE BIOGEL PI IND STRL 7.5 (GLOVE) ×2 IMPLANT
GLOVE BIOGEL PI INDICATOR 6.5 (GLOVE) ×1
GLOVE BIOGEL PI INDICATOR 7.0 (GLOVE) ×1
GLOVE BIOGEL PI INDICATOR 7.5 (GLOVE) ×1
GLOVE ORTHOPEDIC STR SZ6.5 (GLOVE) ×3 IMPLANT
GLOVE SURG SS PI 7.0 STRL IVOR (GLOVE) ×6 IMPLANT
GLOVE SURG SS PI 7.5 STRL IVOR (GLOVE) ×9 IMPLANT
GOWN PREVENTION PLUS XXLARGE (GOWN DISPOSABLE) ×3 IMPLANT
GOWN STRL NON-REIN LRG LVL3 (GOWN DISPOSABLE) ×9 IMPLANT
GOWN STRL REIN XL XLG (GOWN DISPOSABLE) ×3 IMPLANT
HEMOSTAT SNOW SURGICEL 2X4 (HEMOSTASIS) ×3 IMPLANT
HEMOSTAT SURGICEL 2X14 (HEMOSTASIS) IMPLANT
INSERT FOGARTY SM (MISCELLANEOUS) IMPLANT
KIT BASIN OR (CUSTOM PROCEDURE TRAY) ×3 IMPLANT
KIT ROOM TURNOVER OR (KITS) ×3 IMPLANT
NS IRRIG 1000ML POUR BTL (IV SOLUTION) ×6 IMPLANT
PACK CAROTID (CUSTOM PROCEDURE TRAY) ×3 IMPLANT
PAD ARMBOARD 7.5X6 YLW CONV (MISCELLANEOUS) ×6 IMPLANT
PIN SAFETY STERILE (MISCELLANEOUS) ×3 IMPLANT
SHUNT CAROTID BYPASS 10 (VASCULAR PRODUCTS) IMPLANT
SHUNT CAROTID BYPASS 12FRX15.5 (VASCULAR PRODUCTS) IMPLANT
SPECIMEN JAR SMALL (MISCELLANEOUS) IMPLANT
SPONGE GAUZE 4X4 12PLY (GAUZE/BANDAGES/DRESSINGS) ×3 IMPLANT
SUT ETHILON 3 0 PS 1 (SUTURE) ×3 IMPLANT
SUT PROLENE 6 0 BV (SUTURE) ×3 IMPLANT
SUT PROLENE 7 0 BV 1 (SUTURE) IMPLANT
SUT VIC AB 2-0 CT1 27 (SUTURE) ×2
SUT VIC AB 2-0 CT1 TAPERPNT 27 (SUTURE) ×4 IMPLANT
SUT VIC AB 3-0 SH 27 (SUTURE) ×2
SUT VIC AB 3-0 SH 27X BRD (SUTURE) ×4 IMPLANT
SUT VICRYL 4-0 PS2 18IN ABS (SUTURE) ×3 IMPLANT
SWAB COLLECTION DEVICE MRSA (MISCELLANEOUS) ×3 IMPLANT
TOWEL OR 17X24 6PK STRL BLUE (TOWEL DISPOSABLE) ×3 IMPLANT
TOWEL OR 17X26 10 PK STRL BLUE (TOWEL DISPOSABLE) ×6 IMPLANT
TRAY FOLEY CATH 14FRSI W/METER (CATHETERS) ×3 IMPLANT
TUBE ANAEROBIC SPECIMEN COL (MISCELLANEOUS) ×3 IMPLANT
WATER STERILE IRR 1000ML POUR (IV SOLUTION) ×3 IMPLANT

## 2011-12-23 NOTE — Progress Notes (Signed)
Utilization review completed.  

## 2011-12-23 NOTE — Progress Notes (Addendum)
[facility] [place]  Subjective  -   No complaints Feels a fullness in her left neck Mild trouble breathing   Physical Exam:  Fullness in supraclavicular incision with surrounding erythema       Assessment/Plan:  S/p left carotid subclavian bypass  There is a new fullness to her incision which is soft and has surrounding erythema.  I am concerned about lymphatic leak.  I think the best was to evaluate this is to start with a CTA of the area. Continue ABX.  I spoke with Dr. Gwenlyn Found regarding the need to go back to the operating room to further evaluate this fluid collection.  She is obviously high risk from a cardiac perspective, but based on his review of her cath films, we can proceed.  \  I discussed this with the patient.. We discussed that if the graft is infected, it may need to be removed and replaced with a vein graft.  Hopefully it is simply a lymphatic leak.  [place] 12/23/2011 12:11 AM --  Filed Vitals:   12/22/11 2100  BP: 130/83  Pulse: 81  Temp: 98.7 F (37.1 C)  Resp: 18   No intake or output data in the 24 hours ending 12/23/11 0011   Laboratory CBC    Component Value Date/Time   WBC 6.4 12/21/2011 0449   HGB 10.9* 12/21/2011 0449   HCT 32.7* 12/21/2011 0449   PLT 174 12/21/2011 0449    BMET    Component Value Date/Time   NA 136 12/20/2011 0500   K 4.1 12/20/2011 0500   CL 98 12/20/2011 0500   CO2 28 12/20/2011 0500   GLUCOSE 140* 12/20/2011 0500   BUN 7 12/20/2011 0500   CREATININE 0.64 12/20/2011 0500   CALCIUM 8.8 12/20/2011 0500   GFRNONAA 88* 12/20/2011 0500   GFRAA >90 12/20/2011 0500    COAG Lab Results  Component Value Date   INR 1.07 12/19/2011   INR 1.03 12/08/2011   INR 1.03 06/02/2011   No results found for this basename: PTT    Antibiotics Anti-infectives     Start     Dose/Rate Route Frequency Ordered Stop   12/22/11 1000   doxycycline (VIBRA-TABS) tablet 100 mg        100 mg Oral Every 12  hours 12/22/11 0811     12/22/11 0830   cefTRIAXone (ROCEPHIN) 1 g in dextrose 5 % 50 mL IVPB        1 g 100 mL/hr over 30 Minutes Intravenous Every 24 hours 12/22/11 0811     12/21/11 1400   cephALEXin (KEFLEX) capsule 500 mg  Status:  Discontinued        500 mg Oral 4 times daily 12/21/11 1321 12/22/11 0811   12/19/11 1230   levofloxacin (LEVAQUIN) IVPB 500 mg        500 mg 100 mL/hr over 60 Minutes Intravenous To Emergency Dept 12/19/11 1215 12/19/11 1437   12/19/11 1230  piperacillin-tazobactam (ZOSYN) IVPB 3.375 g       3.375 g 12.5 mL/hr over 240 Minutes Intravenous  Once 12/19/11 1224 12/19/11 1648   12/19/11 1230   vancomycin (VANCOCIN) IVPB 1000 mg/200 mL premix        1,000 mg 200 mL/hr over 60 Minutes Intravenous  Once 12/19/11 1224 12/19/11 1946           V. [name], M.D. [facility] [place] Office: [phone] Pager:  [phone]

## 2011-12-23 NOTE — Progress Notes (Signed)
CSW aware of discharge to snf today being canceled due to surgery procedure. CSW informed [facility]. Per discussion with facility patient can d/c to snf on Saturday or Monday when patient is medically stable. Per discussion with facility, weekend CSW will need to contact Evelena Peat in Admission on cell at [phone] and fax dc information to [phone].   .Clinical social worker continuing to follow pt to assist with pt dc plans and further csw needs.   Dorathy Kinsman, [place] .12/23/2011 9:31am

## 2011-12-23 NOTE — Progress Notes (Signed)
Dr. Ola Spurr said to wean nitro drip ,after giving apresoline and d/c arterial line because pt going back to floor . Whip noted in arterial line

## 2011-12-23 NOTE — Transfer of Care (Signed)
Immediate Anesthesia Transfer of Care Note  Patient: Deanna [name]  Procedure(s) Performed: Procedure(s) (LRB): WOUND EXPLORATION (Left)  Patient Location: PACU  Anesthesia Type: General  Level of Consciousness: awake, alert , oriented and sedated  Airway & Oxygen Therapy: Patient Spontanous Breathing and Patient connected to nasal cannula oxygen  Post-op Assessment: Report given to PACU RN, Post -op Vital signs reviewed and stable and Patient moving all extremities  Post vital signs: Reviewed and stable  Complications: No apparent anesthesia complications

## 2011-12-23 NOTE — Interval H&P Note (Signed)
History and Physical Interval Note:  12/23/2011 10:56 AM  Deanna [name]  has presented today for surgery, with the diagnosis of hematoma/fluid collection left neck  The various methods of treatment have been discussed with the patient and family. After consideration of risks, benefits and other options for treatment, the patient has consented to  Procedure(s) (LRB): ENDARTERECTOMY CAROTID (Left) as a surgical intervention .  The patients' history has been reviewed, patient examined, no change in status, stable for surgery.  I have reviewed the patients' chart and labs.  Questions were answered to the patient's satisfaction.     Annika Selke IV, V. WELLS

## 2011-12-23 NOTE — Op Note (Signed)
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  Both the carotid and subclavian anastomosis were intact.  There was no evidence of a thoracic duct or chyle leak  Indications:  The patient is s/p left carotid to subclavian bypass for left arm claudication last week.  She has had a dramatic improvement in her symptoms.  She was admitted for a MI.  She was found to have a large fluid collection under her incision which was confirmed with CT scan. On CTA there was no evidence of pseudoaneurysm or arterial problem.  Because she was very uncomfortable from the swelling and because she has developed erythema around her incision, I thought she needed to have this drained.  I also wanted to rule out a chyle leak.  This was discussed with cardiology.  Procedure:  The patient was identified in the holding area and taken to [facility]  The patient was then placed supine on the table. general anesthesia was administered.  The patient was prepped and draped in the usual sterile fashion.  A time out was called and antibiotics were administered.  The previous incision was opened sharply.  Immediately upon opening the incision, there was drainage which was serous in nature.  It was cultured and sent for a stat gram stain which came back as no evidence of WBCs or organisms.  I explored the wound.  There was no evidence of infection.  There graft remained patent , and the  anastomosis were intact.  There was no evidence of a chyle leak.  I observed the area for approximately 45 minutes.  I did identify an area that appeared to be the source.  It was coming from an area underneath the sub-platysma flap the was created.  This area was over-sewn with several 2-0 vicryl sutures.  I elected to place a 15 Blake drain brought out through a separate stab incision.  I then re-closed the platysma muscle and the skin with vicryl.  There were no complications.   Disposition:  To PACU in stable condition.   Theotis Burrow, M.D. [facility] [place] Office: [phone] Pager:  [phone]

## 2011-12-23 NOTE — Progress Notes (Signed)
Subjective: Today the patient was sleeping comfortably in no apparent distress. Per discussion with nursing staff her vascular surgeon is planning on exploratory surgery of the left neck today. Patient has not had problems with shortness of breath or chest discomfort.  Objective: Vital signs in last 24 hours: Temp:  [97.9 F (36.6 C)-98.7 F (37.1 C)] 98.1 F (36.7 C) (05/17 0500) Pulse Rate:  [76-81] 76  (05/17 0500) Resp:  [18-20] 20  (05/17 0500) BP: (125-137)/(81-85) 125/85 mmHg (05/17 0500) SpO2:  [94 %-96 %] 94 % (05/17 0500) Weight change:    Intake/Output from previous day:     General appearance:  asleep aand in  no apparent distresss Resp: clear to auscultation bilaterally Cardio: regular rate and rhythm, S1, S2 normal, no murmur, click, rub or gallop Extremities: extremities normal, atraumatic, no cyanosis or edema  Lab Results:  [facility] 12/21/11 0449  WBC 6.4  HGB 10.9*  HCT 32.7*  PLT 174   BMET No results found for this basename: NA:2,K:2,CL:2,CO2:2,GLUCOSE:2,BUN:2,CREATININE:2,CALCIUM:2 in the last 72 hours CMET CMP     Component Value Date/Time   NA 136 12/20/2011 0500   K 4.1 12/20/2011 0500   CL 98 12/20/2011 0500   CO2 28 12/20/2011 0500   GLUCOSE 140* 12/20/2011 0500   BUN 7 12/20/2011 0500   CREATININE 0.64 12/20/2011 0500   CALCIUM 8.8 12/20/2011 0500   PROT 6.5 12/20/2011 0500   ALBUMIN 3.2* 12/20/2011 0500   AST 24 12/20/2011 0500   ALT 9 12/20/2011 0500   ALKPHOS 73 12/20/2011 0500   BILITOT 0.7 12/20/2011 0500   GFRNONAA 88* 12/20/2011 0500   GFRAA >90 12/20/2011 0500    CBG (last 3)  No results found for this basename: GLUCAP:3 in the last 72 hours  INR RESULTS:   Lab Results  Component Value Date   INR 1.07 12/19/2011   INR 1.03 12/08/2011   INR 1.03 06/02/2011     Studies/Results: No results found.  Medications: I have reviewed the patient's current medications.  Assessment/Plan: #1 pneumonia: Stable on current antibiotics. Given  that patient is having major vascular surgery today we will see if the vascular surgeon would prefer to assume attending status. Plan to transfer to skilled nursing facility today canceled. #2 status post acute MI: Stable on current medications   LOS: 4 days   Daniele Yankowski G 12/23/2011, 8:18 AM

## 2011-12-23 NOTE — H&P (View-Only) (Signed)
[facility] [place]  Subjective  -   No complaints Feels a fullness in her left neck Mild trouble breathing   Physical Exam:  Fullness in supraclavicular incision with surrounding erythema       Assessment/Plan:  S/p left carotid subclavian bypass  There is a new fullness to her incision which is soft and has surrounding erythema.  I am concerned about lymphatic leak.  I think the best was to evaluate this is to start with a CTA of the area. Continue ABX.  I spoke with Dr. Gwenlyn Found regarding the need to go back to the operating room to further evaluate this fluid collection.  She is obviously high risk from a cardiac perspective, but based on his review of her cath films, we can proceed.  \  I discussed this with the patient.. We discussed that if the graft is infected, it may need to be removed and replaced with a vein graft.  Hopefully it is simply a lymphatic leak.  [place] 12/23/2011 12:11 AM --  Filed Vitals:   12/22/11 2100  BP: 130/83  Pulse: 81  Temp: 98.7 F (37.1 C)  Resp: 18   No intake or output data in the 24 hours ending 12/23/11 0011   Laboratory CBC    Component Value Date/Time   WBC 6.4 12/21/2011 0449   HGB 10.9* 12/21/2011 0449   HCT 32.7* 12/21/2011 0449   PLT 174 12/21/2011 0449    BMET    Component Value Date/Time   NA 136 12/20/2011 0500   K 4.1 12/20/2011 0500   CL 98 12/20/2011 0500   CO2 28 12/20/2011 0500   GLUCOSE 140* 12/20/2011 0500   BUN 7 12/20/2011 0500   CREATININE 0.64 12/20/2011 0500   CALCIUM 8.8 12/20/2011 0500   GFRNONAA 88* 12/20/2011 0500   GFRAA >90 12/20/2011 0500    COAG Lab Results  Component Value Date   INR 1.07 12/19/2011   INR 1.03 12/08/2011   INR 1.03 06/02/2011   No results found for this basename: PTT    Antibiotics Anti-infectives     Start     Dose/Rate Route Frequency Ordered Stop   12/22/11 1000   doxycycline (VIBRA-TABS) tablet 100 mg        100 mg Oral Every 12  hours 12/22/11 0811     12/22/11 0830   cefTRIAXone (ROCEPHIN) 1 g in dextrose 5 % 50 mL IVPB        1 g 100 mL/hr over 30 Minutes Intravenous Every 24 hours 12/22/11 0811     12/21/11 1400   cephALEXin (KEFLEX) capsule 500 mg  Status:  Discontinued        500 mg Oral 4 times daily 12/21/11 1321 12/22/11 0811   12/19/11 1230   levofloxacin (LEVAQUIN) IVPB 500 mg        500 mg 100 mL/hr over 60 Minutes Intravenous To Emergency Dept 12/19/11 1215 12/19/11 1437   12/19/11 1230  piperacillin-tazobactam (ZOSYN) IVPB 3.375 g       3.375 g 12.5 mL/hr over 240 Minutes Intravenous  Once 12/19/11 1224 12/19/11 1648   12/19/11 1230   vancomycin (VANCOCIN) IVPB 1000 mg/200 mL premix        1,000 mg 200 mL/hr over 60 Minutes Intravenous  Once 12/19/11 1224 12/19/11 1946           V. [name], M.D. [facility] [place] Office: [phone] Pager:  [phone]

## 2011-12-23 NOTE — Anesthesia Preprocedure Evaluation (Addendum)
Anesthesia Evaluation  Patient identified by MRN, date of birth, ID band Patient awake    Reviewed: Allergy & Precautions, H&P , NPO status , Patient's Chart, lab work & pertinent test results, reviewed documented beta blocker date and time   Airway Mallampati: II TM Distance: >3 FB Neck ROM: Full    Dental No notable dental hx. (+) Teeth Intact and Dental Advisory Given   Pulmonary shortness of breath and at rest,  breath sounds clear to auscultation  Pulmonary exam normal       Cardiovascular hypertension, On Medications and On Home Beta Blockers + angina at rest + CAD and + Past MI Rhythm:Regular Rate:Normal     Neuro/Psych PSYCHIATRIC DISORDERS negative neurological ROS     GI/Hepatic Neg liver ROS, GERD-  Medicated and Controlled,  Endo/Other  negative endocrine ROS  Renal/GU negative Renal ROS  negative genitourinary   Musculoskeletal   Abdominal   Peds  Hematology negative hematology ROS (+)   Anesthesia Other Findings   Reproductive/Obstetrics negative OB ROS                           Anesthesia Physical Anesthesia Plan  ASA: III  Anesthesia Plan: General   Post-op Pain Management:    Induction: Intravenous  Airway Management Planned: Oral ETT  Additional Equipment: Arterial line  Intra-op Plan:   Post-operative Plan: Extubation in OR  Informed Consent: I have reviewed the patients History and Physical, chart, labs and discussed the procedure including the risks, benefits and alternatives for the proposed anesthesia with the patient or authorized representative who has indicated his/her understanding and acceptance.   Dental advisory given  Plan Discussed with: CRNA  Anesthesia Plan Comments:        Anesthesia Quick Evaluation

## 2011-12-23 NOTE — Preoperative (Signed)
Beta Blockers   Reason not to administer Beta Blockers:Not Applicable 

## 2011-12-24 MED ORDER — DIPHENHYDRAMINE HCL 25 MG PO CAPS
25.0000 mg | ORAL_CAPSULE | ORAL | Status: DC | PRN
  Administered 2011-12-24 – 2011-12-27 (×2): 50 mg via ORAL
  Filled 2011-12-24 (×2): qty 2

## 2011-12-24 NOTE — Progress Notes (Signed)
CARDIAC REHAB PHASE I   PRE:  Rate/Rhythm: 58SR  BP:  Supine:   Sitting: 144/70  Standing:    SaO2: 92%RA  MODE:  Ambulation: 250 ft   POST:  Rate/Rhythem: 70SR  BP:  Supine:   Sitting: 150/80  Standing:    SaO2: 83%RA, 95% 2.5L 1310-1340 Pt  Walked 250 ft on RA with rolling walker and asst x 2. Had difficulty with walker so we held hands for part of walk. Pt a little wobbly. Tolerated well except for desat on RA. Sats better on 2.5 L when in recliner. Chair alarm on. Call bell in reach.  Jeani Sow

## 2011-12-24 NOTE — Progress Notes (Signed)
VASCULAR PROGRESS NOTE  SUBJECTIVE: No specific complaints  PHYSICAL EXAM: Filed Vitals:   12/23/11 2242 12/24/11 0000 12/24/11 0400 12/24/11 1034  BP: 153/58 145/57 132/60 145/65  Pulse: 83 76 74 74  Temp:   97.5 F (36.4 C)   TempSrc:      Resp:  16 18   Height:      Weight:      SpO2:  93% 93%    JP drain 15 cc total so far  Lungs: clear to auscultation Mild erythema of left supraclavicular incision.   LABS: Lab Results  Component Value Date   WBC 6.4 12/21/2011   HGB 10.9* 12/21/2011   HCT 32.7* 12/21/2011   MCV 82.6 12/21/2011   PLT 174 12/21/2011   Lab Results  Component Value Date   CREATININE 0.64 12/20/2011   Lab Results  Component Value Date   INR 1.07 12/19/2011   CBG (last 3)  No results found for this basename: GLUCAP:3 in the last 72 hours   ASSESSMENT/PLAN: 1. 1 Day Post-Op s/p: exploration of left supraclavicular incision 2. Continue JP at least another day. 3. Culture: No growth so far. Gram stain was negative 4. On IV Rocephin 5. Leave foley because of history of urinary incontinence.  Deitra Mayo, MD, FACS Beeper: [phone] 12/24/2011

## 2011-12-24 NOTE — Progress Notes (Signed)
Attempted to d/c foley but pt requests that it be left in due to urinary incontinence.

## 2011-12-24 NOTE — Progress Notes (Signed)
Pt c/o severe itching around dsg.  Stated this same thing happened when she had knee surgery and that she was allergic to the cloth-like tape.  Dsg was removed; new dsg placed with regular tape.  Dr. Scot Dock was notified and PO Benadryl was started.  Will continue to monitor pt.

## 2011-12-25 LAB — WOUND CULTURE

## 2011-12-25 NOTE — Progress Notes (Signed)
VASCULAR PROGRESS NOTE  SUBJECTIVE: Had some itching from tape. This is resolving.   PHYSICAL EXAM: Filed Vitals:   12/24/11 1400 12/24/11 2100 12/24/11 2207 12/25/11 0600  BP: 100/55 134/50 110/65 141/60  Pulse: 70 75  68  Temp: 97.8 F (36.6 C) 98.3 F (36.8 C)  97.8 F (36.6 C)  TempSrc:  Oral  Oral  Resp: 18 18  18   Height:      Weight:      SpO2: 92% 99%  98%   Lungs: clear to auscultation Left supraclavicular incision with mild erythema. No drainage JP 65 cc for 24 hours   LABS:   Lab Results  Component Value Date   INR 1.07 12/19/2011   ASSESSMENT/PLAN: 1. 2 Days Post-Op s/p: Evacuation of lymphocele left neck 2. Leave JP until drainage less  Deitra Mayo, MD, FACS Beeper: [phone] 12/25/2011

## 2011-12-26 ENCOUNTER — Ambulatory Visit: Payer: Medicare Other | Admitting: Surgery

## 2011-12-26 NOTE — Progress Notes (Signed)
CARDIAC REHAB PHASE I   PRE:  Rate/Rhythm: 70 SR  BP:  Supine:   Sitting:150/50   Standing:    SaO2: 96 RA  MODE:  Ambulation: 150 ft   POST:  Rate/Rhythem: 79  BP:  Supine:   Sitting: 130/70  Standing:    SaO2: 92 RA 0925-1000 On arrival pt c/o of constipation, miserable. Assisted x 1 and  used walker to ambulate. Gait steady pt c/o of just feeling miserable and felt she could not walk far. VS stable. Pt back to recliner after walk with call light in reach.Pt ask for warm prune juice for constipation and given to her. Encouraged her to walk more today.  Deon Pilling

## 2011-12-26 NOTE — Progress Notes (Signed)
CSW continuing to assist with pt dc plans. Pt chosen facility of [facility] is anticipating pt tomorrow or Wednesday as facility has limited availability.   Dorathy Kinsman, [place] .12/26/2011 14:10pm

## 2011-12-26 NOTE — Progress Notes (Signed)
[facility] [place]  Subjective  - status post left carotid to subclavian bypass and evacuation of lymphocele  Patient continues to recover nicely. She has no specific complaints today.   Physical Exam:  There is decreased erythema around her incision. There is no evidence of recurrent lymphatic leak. She has a palpable radial pulse Drainage has been scant since midnight and 35 cc over the past 24 hours   Assessment/Plan:  POD #3  The patient is making good progress. We will continue with antibiotics as the erythema continued to decrease. I'll keep the drain in at least one more day. I like to see if it increases with her oral intake. The drainage fluid is serosanguineous. This does not appear to be a chylous leak.  [place] 12/26/2011 6:58 PM --  Filed Vitals:   12/26/11 1400  BP: 137/62  Pulse: 70  Temp: 98.5 F (36.9 C)  Resp: 18    Intake/Output Summary (Last 24 hours) at 12/26/11 1858 Last data filed at 12/26/11 1800  Gross per 24 hour  Intake      0 ml  Output 1379.5 ml  Net -1379.5 ml     Laboratory CBC    Component Value Date/Time   WBC 6.4 12/21/2011 0449   HGB 10.9* 12/21/2011 0449   HCT 32.7* 12/21/2011 0449   PLT 174 12/21/2011 0449    BMET    Component Value Date/Time   NA 136 12/20/2011 0500   K 4.1 12/20/2011 0500   CL 98 12/20/2011 0500   CO2 28 12/20/2011 0500   GLUCOSE 140* 12/20/2011 0500   BUN 7 12/20/2011 0500   CREATININE 0.64 12/20/2011 0500   CALCIUM 8.8 12/20/2011 0500   GFRNONAA 88* 12/20/2011 0500   GFRAA >90 12/20/2011 0500    COAG Lab Results  Component Value Date   INR 1.07 12/19/2011   INR 1.03 12/08/2011   INR 1.03 06/02/2011   No results found for this basename: PTT    Antibiotics Anti-infectives     Start     Dose/Rate Route Frequency Ordered Stop   12/22/11 1000   doxycycline (VIBRA-TABS) tablet 100 mg        100 mg Oral Every 12 hours 12/22/11 0811     12/22/11 0830   cefTRIAXone  (ROCEPHIN) 1 g in dextrose 5 % 50 mL IVPB        1 g 100 mL/hr over 30 Minutes Intravenous Every 24 hours 12/22/11 0811     12/21/11 1400   cephALEXin (KEFLEX) capsule 500 mg  Status:  Discontinued        500 mg Oral 4 times daily 12/21/11 1321 12/22/11 0811   12/19/11 1230   levofloxacin (LEVAQUIN) IVPB 500 mg        500 mg 100 mL/hr over 60 Minutes Intravenous To Emergency Dept 12/19/11 1215 12/19/11 1437   12/19/11 1230  piperacillin-tazobactam (ZOSYN) IVPB 3.375 g       3.375 g 12.5 mL/hr over 240 Minutes Intravenous  Once 12/19/11 1224 12/19/11 1648   12/19/11 1230   vancomycin (VANCOCIN) IVPB 1000 mg/200 mL premix        1,000 mg 200 mL/hr over 60 Minutes Intravenous  Once 12/19/11 1224 12/19/11 1946           V. [name], M.D. [facility] [place] Office: [phone] Pager:  [phone]

## 2011-12-26 NOTE — Progress Notes (Signed)
Physical Therapy Treatment Patient Details Name: Deanna [name] MRN: [id number] DOB: 1941/05/30 Today's Date: 12/26/2011 Time: [id number] PT Time Calculation (min): 20 min  PT Assessment / Plan / Recommendation Comments on Treatment Session  Patient now S/P evacuation of lymphocele 2 days ago. She is presenting with improved mobility, but lacks motivation to push self and needs encouragement to particpate.     Follow Up Recommendations  Skilled nursing facility    Barriers to Discharge  None      Equipment Recommendations  Defer to next venue    Recommendations for Other Services  None  Frequency Min 3X/week   Plan Discharge plan remains appropriate;Frequency remains appropriate    Precautions / Restrictions Precautions Precautions: Fall   Pertinent Vitals/Pain Headache - RN aware (4/10)    Mobility  Bed Mobility Details for Bed Mobility Assistance: Up in chair Transfers Sit to Stand: 7: Independent;With upper extremity assist;With armrests;From chair/3-in-1 Stand to Sit: 7: Independent;With armrests;To chair/3-in-1 Details for Transfer Assistance: Correct hand placement to and from device. Ambulation/Gait Ambulation/Gait Assistance: 4: Min guard Ambulation Distance (Feet): 220 Feet Assistive device: Rolling walker Ambulation/Gait Assistance Details: Improved safety and speed with gait with walker. Verbal cues to maintain safe distance from walker at all times. No DOE.      PT Goals Acute Rehab PT Goals PT Goal: Ambulate - Progress: Progressing toward goal  Visit Information  Last PT Received On: 12/26/11 Assistance Needed: +1    Subjective Data  Subjective: Patient reports not feeling very motivated today Patient Stated Goal: Rehab   Cognition  Overall Cognitive Status: Impaired Area of Impairment: Memory Arousal/Alertness: Awake/alert Orientation Level: Appears intact for tasks assessed Behavior During Session: [facility] for tasks performed Memory Deficits:  Patient did not recall recent medication and states she feels her memory is not the same as normal    Balance   No evidence of imbalance  End of Session PT - End of Session Equipment Utilized During Treatment: Gait belt Activity Tolerance:  (self limiting) Patient left: in chair;with call bell/phone within reach    Jake Bathe, PT  Pager [phone]  12/26/2011, 12:21 PM

## 2011-12-26 NOTE — Anesthesia Postprocedure Evaluation (Signed)
  Anesthesia Post-op Note  Patient: Deanna [name]  Procedure(s) Performed: Procedure(s) (LRB): WOUND EXPLORATION (Left)  Patient Location: PACU  Anesthesia Type: General  Level of Consciousness: awake and alert   Airway and Oxygen Therapy: Patient Spontanous Breathing  Post-op Pain: mild  Post-op Assessment: Post-op Vital signs reviewed, Patient's Cardiovascular Status Stable, Respiratory Function Stable and Patent Airway  Post-op Vital Signs: Reviewed and stable  Complications: No apparent anesthesia complications

## 2011-12-27 NOTE — Progress Notes (Signed)
Clinical Social Worker spoke with [facility] and they are able to receive patient tomorrow if patient is medically ready, as they have one space available. CSW will continue to follow to facilitate discharge.   Leandro Reasoner MSW, [place] ([place]) [phone]

## 2011-12-27 NOTE — Progress Notes (Addendum)
VASCULAR AND VEIN SURGERY POST - OP  PROGRESS NOTE  Date of Surgery: 12/19/2011 - 12/23/2011 Surgeon: Juliann Mule): Serafina Mitchell, MD 4 Days Post-Op  left carotid to subclavian bypass and evacuation of lymphocele    HPI: Deanna [name] is a 71 y.o. female who is 4 Days Post-Op  . Patient is doing well. Pre-operative symptoms are Improved with minimal drainage from JP last 24 hours.  Intake/Output Summary (Last 24 hours) at 12/27/11 0852 Last data filed at 12/27/11 [id number]  Gross per 24 hour  Intake    480 ml  Output 1776.5 ml  Net -1296.5 ml  JP -23cc in 24 hours/ 7cc since 7pm 5/20  Physical Exam:  BP Readings from Last 3 Encounters:  12/27/11 133/60  12/27/11 133/60  12/27/11 133/60   Temp Readings from Last 3 Encounters:  12/27/11 97.9 F (36.6 C) Oral  12/27/11 97.9 F (36.6 C) Oral  12/27/11 97.9 F (36.6 C) Oral   SpO2 Readings from Last 3 Encounters:  12/27/11 95%  12/27/11 95%  12/27/11 95%   Pulse Readings from Last 3 Encounters:  12/27/11 71  12/27/11 71  12/27/11 71    Pt is A&O x 3 Speech is fluent left Neck Wound is healing well without erythema  Assessment: Deanna [name] is a 71 y.o. female  4 Days Post-Op  left carotid to subclavian bypass and evacuation of lymphocele  Plan:D/W Dr. Trula Slade - he wants to leave JP drain one more day Anticipate DC in AM  [facility] [phone] 12/27/2011 8:52 AM   Agree with above Continue ABx Wound looking better Keep JP for another day anticipate d/c to SNF tomorrow  Annamarie Major

## 2011-12-27 NOTE — Progress Notes (Signed)
CARDIAC REHAB PHASE I   PRE:  Rate/Rhythm: 74SR  BP:  Supine: 130/60  Sitting:   Standing:    SaO2: 94%RA  MODE:  Ambulation: 350 ft   POST:  Rate/Rhythem: 80  BP:  Supine: 142/70  Sitting:   Standing:    SaO2: 95%RA 0927-1005 Pt walked 350 ft on RA with rolling walker and asst x 1 with steady gait. Tolerated well. To recliner with call bell after walk.  Deanna [name]

## 2011-12-28 LAB — ANAEROBIC CULTURE

## 2011-12-28 NOTE — Progress Notes (Signed)
Noticed some bleeding coming through the neck's  dressing, paged the vascular surgeon on call. Received an order to reinforce dressing with gauze. I will continue to monitor. Pt has no complaint and vitals signs are stables.

## 2011-12-28 NOTE — Progress Notes (Addendum)
VASCULAR AND VEIN SURGERY POST - OP CEA PROGRESS NOTE  Date of Surgery: 12/19/2011 - 12/23/2011 Surgeon: Juliann Mule): Serafina Mitchell, MD 5 Days Post-Op left Carotid subclavian bypass/lymphocele drainage  .  HPI: Deanna [name] is a 71 y.o. female who is 5 Days Post-Op. JP drain now clotted. 9cc drainage overnight and pt has had drainage around drain site All cultures negative  Intake/Output Summary (Last 24 hours) at 12/28/11 0835 Last data filed at 12/28/11 0400  Gross per 24 hour  Intake    480 ml  Output    708 ml  Net   -228 ml   JP drain 5cc/12 hours ; serous drainage around tube. Tube milked and stayed collapsed DC'd with clot in drain  Physical Exam:  BP Readings from Last 3 Encounters:  12/28/11 142/72  12/28/11 142/72  12/28/11 142/72   Temp Readings from Last 3 Encounters:  12/28/11 97.9 F (36.6 C) Oral  12/28/11 97.9 F (36.6 C) Oral  12/28/11 97.9 F (36.6 C) Oral   SpO2 Readings from Last 3 Encounters:  12/28/11 95%  12/28/11 95%  12/28/11 95%   Pulse Readings from Last 3 Encounters:  12/28/11 70  12/28/11 70  12/28/11 70    Pt is A&O x 3 Speech is fluent Neuro exam remains intact  LUE without symptoms left Neck Wound is healing well Some serous drainage through drain site.   Assessment: Deanna [name] is a 71 y.o. female who is 5 days post lymphocele drainage.  JP drain now clotted - removed  9cc drainage overnight and pt has had drainage around drain site All cultures negative Will watch overnight for reaccumulation of lymphocele in left neck       Richrd Prime [phone] 12/28/2011 8:35 AM  Agree with above Bleeding from JP exit site over night Minimal JP output, will d/c Monitor overnight for fluid accumalation Likely d/c tomorrow Continue ABX for 2 weeks  Annamarie Major

## 2011-12-28 NOTE — Progress Notes (Signed)
Patient Deanna [name]. [name], 71 year old white female struggles with several health issues.  She enjoys the emotional support of his daughter in [place], [place].  Following her surgery, patient feels confident her rehabilitation will go well.  Patient expressed appreciation for Chaplain's provision of pastoral prayer, presence, and conversation.

## 2011-12-28 NOTE — Progress Notes (Signed)
12-28-11 [place], RN,BSN [phone] Pt was discussed in Long length of stay meeting today.

## 2011-12-28 NOTE — Progress Notes (Signed)
CSW informed pt that bed at [facility] was lost due to high demand at facility. Pt was very disappointed and anxious. Pt shared she only wanted to be in [place] for rehab. Pt asked csw to discuss further with pt daughter. Pt daught erand pt still prefer [facility] but are open to other facilities. Pt duaghter to follow up with csw in the morning regarding other facilities in the morning. CSW did provide pt and pt daughter with the bed offers however pt daughter unhhappy with options.   .Clinical social worker continuing to follow pt to assist with pt dc plans and further csw needs.   Dorathy Kinsman, [place] .12/28/2011 1711pm

## 2011-12-28 NOTE — Progress Notes (Signed)
CARDIAC REHAB PHASE I   PRE:  Rate/Rhythm: 72SR  BP:  Supine:   Sitting: 138/70  Standing:    SaO2: 94%RA  MODE:  Ambulation: 400 ft   POST:  Rate/Rhythem: 81  BP:  Supine:   Sitting: 150/80  Standing:    SaO2: 94%RA 1410-1442 Pt walked 400 ft on RA with rolling walker with steady gait. Tolerated well. To recliner with call bell.  Jeani Sow

## 2011-12-29 ENCOUNTER — Encounter (HOSPITAL_COMMUNITY): Payer: Self-pay

## 2011-12-29 ENCOUNTER — Telehealth: Payer: Self-pay | Admitting: Surgery

## 2011-12-29 MED ORDER — FERROUS SULFATE 325 (65 FE) MG PO TABS: 325.0000 mg | ORAL_TABLET | Freq: Every day | ORAL | Status: DC

## 2011-12-29 MED ORDER — PROMETHAZINE HCL 12.5 MG PO TABS: 12.5000 mg | ORAL_TABLET | Freq: Four times a day (QID) | ORAL | Status: DC | PRN

## 2011-12-29 MED ORDER — OXYCODONE-ACETAMINOPHEN 5-325 MG PO TABS: 1.0000 | ORAL_TABLET | Freq: Four times a day (QID) | ORAL | Status: AC | PRN

## 2011-12-29 MED ORDER — LORAZEPAM 1 MG PO TABS: 0.5000 mg | ORAL_TABLET | ORAL | Status: DC | PRN

## 2011-12-29 MED ORDER — ZOLPIDEM TARTRATE 5 MG PO TABS: 5.0000 mg | ORAL_TABLET | Freq: Every evening | ORAL | Status: DC | PRN

## 2011-12-29 NOTE — Telephone Encounter (Signed)
Left voicemail for patient re surgical follow up appt on 01/16/12 at 11:00 with Dr Trula Slade. Sent appt letter to home address as well, dpm

## 2011-12-29 NOTE — Progress Notes (Signed)
Pt for D/C and declines walking now. No questions. Yves Dill CES, ACSM

## 2011-12-29 NOTE — Progress Notes (Signed)
VASCULAR AND VEIN SURGERY POST - OP CEA PROGRESS NOTE  Date of Surgery: 12/19/2011 - 12/23/2011 Surgeon: Juliann Mule): Deanna Mitchell, MD  HPI: Deanna [name] is a 71 y.o. female who is 6 Days Post-Op. Patient is doing well.  She has had no drainage from wound. No C/O  No intake or output data in the 24 hours ending 12/29/11 0801  Physical Exam:  BP Readings from Last 3 Encounters:  12/29/11 131/80  12/29/11 131/80  12/29/11 131/80   Temp Readings from Last 3 Encounters:  12/29/11 97.7 F (36.5 C) Oral  12/29/11 97.7 F (36.5 C) Oral  12/29/11 97.7 F (36.5 C) Oral   SpO2 Readings from Last 3 Encounters:  12/29/11 92%  12/29/11 92%  12/29/11 92%   Pulse Readings from Last 3 Encounters:  12/29/11 67  12/29/11 67  12/29/11 67   Pt is A&O x 3 Gait is normal Speech is fluent left Neck Wound is healing well with no drainage or erythema Pt has good and equal strength in all extremities  Assessment: Deanna [name] is a 71 y.o. female is doing well Change dressing daily as needed May shower in 3 days  Plan: Discharge to: [facility] when bed available Follow-up in 3 weeks - our office will arrange  Richrd Prime [id number] 12/29/2011 8:01 AM

## 2011-12-29 NOTE — Discharge Summary (Signed)
[facility] Discharge Summary   Patient ID:  Deanna [name] MRN: [id number] DOB/AGE: 1941/06/02 71 y.o.  Admit date: 12/19/2011 Discharge date: 12/29/2011 Date of Surgery: 12/19/2011 - 12/23/2011 Surgeon: Juliann Mule): Serafina Mitchell, MD  Admission Diagnosis: Healthcare-associated pneumonia [486] NSTEMI (non-ST elevated myocardial infarction) [410.70] SOB cp hematoma/fluid collection left neck  Discharge Diagnoses:  Healthcare-associated pneumonia [486] NSTEMI (non-ST elevated myocardial infarction) [410.70] SOB cp hematoma/fluid collection left neck  Secondary Diagnoses: Past Medical History  Diagnosis Date  . Hypertension   . Colon polyps   . Hemorrhoids   . Hypothyroidism   . GERD (gastroesophageal reflux disease)   . Anxiety   . Depression   . Myocardial infarction   . CAD (coronary artery disease)     followed by dr [name].  . Sinus drainage     took z-pack   finished yesterday    Procedure(s): WOUND EXPLORATION  Discharged Condition: good  HPI:  Patient is a 71 year old woman who is well-known to me. She has a medical history that is most significant for hypertension, coronary artery disease with myocardial infarction, gastroesophageal reflux disease, and atherosclerotic peripheral vascular disease. 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 She did well with the surgery was discharged home the next day. Earlier on the morning of May 11, she awoke with substernal chest pain and nausea and vomiting. This persisted for several hours, and she did not seek medical attention. Over the weekend she has had intermittent nausea with mild substernal chest discomfort and persistent mild shortness of breath. She presented to the emergency room today for evaluation of these persistent symptoms. . She was seen earlier today by cardiology consultant who noted that she had an elevated  troponin I level with normal total CPK level, and it was felt that she may have had a non- ST segment elevation myocardial infarction recently.   The patient is s/p left carotid to subclavian bypass for left arm claudication last week. She has had a dramatic improvement in her symptoms. She was admitted for a MI. She was found to have a large fluid collection under her incision which was confirmed with CT scan. On CTA there was no evidence of pseudoaneurysm or arterial problem. Because she was very uncomfortable from the swelling and because she has developed erythema around her incision, I thought she needed to have this drained. I also wanted to rule out a chyle leak. This was discussed with cardiology.    Hospital Course:  Deanna [name] is a 71 y.o. female is S/P Left Procedure(s): WOUND EXPLORATION Extubated: POD # 0 Post-op wounds healing well Pt. Ambulating, voiding and taking PO diet without difficulty. Pt pain controlled with PO pain meds. Labs as below Complications: NSMI - management per cardiology Lymph leak - resolved with exploration and drainage  Consults:  Treatment Team:  Donnajean Lopes, MD  Significant Diagnostic Studies: CBC Lab Results  Component Value Date   WBC 6.4 12/21/2011   HGB 10.9* 12/21/2011   HCT 32.7* 12/21/2011   MCV 82.6 12/21/2011   PLT 174 12/21/2011    BMET    Component Value Date/Time   NA 136 12/20/2011 0500   K 4.1 12/20/2011 0500   CL 98 12/20/2011 0500   CO2 28 12/20/2011 0500   GLUCOSE 140* 12/20/2011 0500   BUN 7 12/20/2011 0500   CREATININE 0.64 12/20/2011 0500   CALCIUM 8.8 12/20/2011 0500  GFRNONAA 88* 12/20/2011 0500   GFRAA >90 12/20/2011 0500   COAG Lab Results  Component Value Date   INR 1.07 12/19/2011   INR 1.03 12/08/2011   INR 1.03 06/02/2011     Disposition:  Discharge to :Skilled nursing facility Discharge Orders    Future Orders Please Complete By Expires   Amb Referral to Cardiac Rehabilitation      Resume  previous diet      Driving Restrictions      Comments:   No driving   Lifting restrictions      Comments:   No lifting for 4 weeks   Call MD for:  temperature >100.5      Call MD for:  redness, tenderness, or signs of infection (pain, swelling, bleeding, redness, odor or green/yellow discharge around incision site)      Call MD for:  severe or increased pain, loss or decreased feeling  in affected limb(s)      Increase activity slowly      Comments:   Walk with assistance use walker or cane as needed   May shower       Scheduling Instructions:   Sunday   Remove dressing in 24 hours      Change dressing (specify)      Comments:   Dressing change: as needed if any drainage from drain site.      [place], [place]  Home Medication Instructions [id number]   Printed on:12/29/11 [phone]  Medication Information                    amLODipine (NORVASC) 5 MG tablet Take 5 mg by mouth daily.           levothyroxine (SYNTHROID, LEVOTHROID) 175 MCG tablet Take 175 mcg by mouth daily.           valsartan (DIOVAN) 80 MG tablet Take 80 mg by mouth daily.           metoprolol (LOPRESSOR) 50 MG tablet Take 50 mg by mouth 2 (two) times daily.           sertraline (ZOLOFT) 100 MG tablet Take 50-100 mg by mouth 2 (two) times daily. Take 1 tablet in the morning and 1/2 a tablet in the evening           omeprazole (PRILOSEC) 20 MG capsule Take 20 mg by mouth daily.           clopidogrel (PLAVIX) 75 MG tablet Take 75 mg by mouth daily.           aspirin 81 MG tablet Take 81 mg by mouth daily.            Cholecalciferol (VITAMIN D-3 PO) Take 1 tablet by mouth daily.           vitamin B-12 (CYANOCOBALAMIN) 1000 MCG tablet Take 1,000 mcg by mouth daily.           rosuvastatin (CRESTOR) 10 MG tablet Take 10 mg by mouth daily.           LORazepam (ATIVAN) 1 MG tablet Take 0.5 mg by mouth as needed. For aniexty           meclizine (ANTIVERT) 12.5 MG tablet Take 12.5 mg by mouth 3  (three) times daily as needed. For dizziness           oxyCODONE-acetaminophen (PERCOCET) 5-325 MG per tablet Take 1 tablet by mouth every 6 (six) hours as needed.  Verbal and written Discharge instructions given to the patient. Wound care per Discharge AVS Follow-up Information    Follow up with Rebecca Eaton, MD on 01/09/2012. (@ 1100 AM)    Contact information:   [address] [place] [phone]          Signed: Richrd Prime 12/29/2011, 8:12 AM

## 2011-12-29 NOTE — Telephone Encounter (Signed)
Message copied by Gena Fray on Thu Dec 29, 2011  2:43 PM ------      Message from: Alfonso Patten      Created: Thu Dec 29, 2011  2:19 PM                   ----- Message -----         From: Richrd Prime, [place]         Sent: 12/29/2011   8:10 AM           To: Alfonso Patten, RN            F/U 2-3 weeks - Dr. Trula Slade - F/U left neck carotid/ subclavian bypass - pt may already have appt.

## 2011-12-29 NOTE — Progress Notes (Signed)
.  Clinical social worker assisted with patient discharge to skilled nursing facility, [facility]. .Patient transportation provided by [employer] [facility] with patient chart copy. RN agreed for transportation to be scheduled for 2pm. .No further Clinical Social Work needs, signing off.   Dorathy Kinsman, [place] .12/29/2011 13:33pm

## 2011-12-29 NOTE — Discharge Summary (Signed)
Agree with above No problems after drain removal. Ready for discharge  Deanna [name]

## 2011-12-29 NOTE — Progress Notes (Signed)
Pt discharged to [facility] per MD order. Pt received all discharge instructions and medication information including prescriptions and follow-up appointment information. RN attempted to call report to receiving RN at [facility] but receiving RN unavailable. RN left message for return call. Pt transported via [facility] to facility.  Pt alert and oriented at discharge with no complaints of pain.  Deanna [name]

## 2011-12-29 NOTE — Progress Notes (Signed)
Pt plans to dc to [place] worker continuing to follow pt to assist with pt dc plans and further csw needs.   Dorathy Kinsman, [place] .12/29/2011 1041am

## 2012-01-13 ENCOUNTER — Encounter: Payer: Self-pay | Admitting: Surgery

## 2012-01-16 ENCOUNTER — Encounter: Payer: Self-pay | Admitting: Surgery

## 2012-01-16 ENCOUNTER — Ambulatory Visit (INDEPENDENT_AMBULATORY_CARE_PROVIDER_SITE_OTHER): Payer: Medicare Other | Admitting: Surgery

## 2012-01-16 VITALS — BP 106/72 | HR 86 | Resp 18 | Ht 62.0 in | Wt 198.3 lb

## 2012-01-16 DIAGNOSIS — G458 Other transient cerebral ischemic attacks and related syndromes: Secondary | ICD-10-CM

## 2012-01-16 NOTE — Progress Notes (Signed)
The patient comes back today for followup. She is status post left carotid subclavian bypass graft for left subclavian artery occlusion. This was done on Thursday, May 9. Her postoperative course was initially uncomplicated. The weekend following her operation she came back to the hospital with an acute MI. She also developed worsening swelling and erythema from her incision site. Medical management was the plan at care for her coronary disease. She was taken back to the operating room on 517 for reexploration of her left neck incision. I found a lymphatic leak from just beneath the subcutaneous platysmal flap that was created. This was ligated. A drain was placed. Her erythema cleared off of antibiotics and a drainage to decrease to the point where her drain was removed. She was ultimately discharged from the hospital. She is here today for followup. She is doing very well. She no longer has the pain in her left arm that keeps her up at night. She is still having some cognitive issues specifically, she is having trouble with recall of objects. She is also somewhat unsteady on her feet and has fatigue.  On examination she has a palpable left radial pulse. Her incision is well-healed. There is no drainage. There is no erythema.  Overall I am very pleased with the progress that she has made. She is due to go home in the immediate future. She lives alone. Her primary care provider will be her daughter. I am reluctant to clear the patient to drive on her on. She will need assistance with her daily activities which will likely need to be provided by her daughter. I'll plan on having her come back to see me in 6 weeks for followup to see how much improvement she is made while at home.

## 2012-02-06 ENCOUNTER — Telehealth: Payer: Self-pay

## 2012-02-06 NOTE — Telephone Encounter (Signed)
Phone call from pt.  States she has had increased shortness of breath with exertion, and feels very tired.  In questioning pt., states she has chest tightness in the center of her chest.  States I get "anxiety", and "choke" and "feel like I can't swallow at times".  Denies following up with her cardiologist since she had her recent heart attack.  Advised pt. To call her cardiologist today, and report symptoms.  States she will call him today.  Also, states her daughter has been staying with her.  Reiterated to pt. that she should notify her cardiologist of her symptoms. Phone # given, for [name], to pt.   Verb. Understanding.

## 2012-02-23 ENCOUNTER — Inpatient Hospital Stay (HOSPITAL_COMMUNITY): Admission: RE | Admit: 2012-02-23 | Payer: Medicare Other | Source: Ambulatory Visit

## 2012-02-27 ENCOUNTER — Ambulatory Visit (HOSPITAL_COMMUNITY): Payer: Medicare Other

## 2012-02-29 ENCOUNTER — Ambulatory Visit (HOSPITAL_COMMUNITY): Payer: Medicare Other

## 2012-03-01 ENCOUNTER — Ambulatory Visit (HOSPITAL_COMMUNITY): Payer: Medicare Other

## 2012-03-02 ENCOUNTER — Ambulatory Visit (HOSPITAL_COMMUNITY): Payer: Medicare Other

## 2012-03-05 ENCOUNTER — Ambulatory Visit: Payer: Medicare Other | Admitting: Surgery

## 2012-03-05 ENCOUNTER — Ambulatory Visit (HOSPITAL_COMMUNITY): Payer: Medicare Other

## 2012-03-07 ENCOUNTER — Ambulatory Visit (HOSPITAL_COMMUNITY): Payer: Medicare Other

## 2012-03-08 ENCOUNTER — Ambulatory Visit (HOSPITAL_COMMUNITY): Payer: Medicare Other

## 2012-03-09 ENCOUNTER — Ambulatory Visit (HOSPITAL_COMMUNITY): Payer: Medicare Other

## 2012-03-09 ENCOUNTER — Encounter: Payer: Self-pay | Admitting: Surgery

## 2012-03-12 ENCOUNTER — Ambulatory Visit (HOSPITAL_COMMUNITY): Payer: Medicare Other

## 2012-03-12 ENCOUNTER — Ambulatory Visit: Payer: Medicare Other | Admitting: Surgery

## 2012-03-14 ENCOUNTER — Ambulatory Visit (HOSPITAL_COMMUNITY): Payer: Medicare Other

## 2012-03-16 ENCOUNTER — Ambulatory Visit (HOSPITAL_COMMUNITY): Payer: Medicare Other

## 2012-03-19 ENCOUNTER — Ambulatory Visit (HOSPITAL_COMMUNITY): Payer: Medicare Other

## 2012-03-21 ENCOUNTER — Ambulatory Visit (HOSPITAL_COMMUNITY): Payer: Medicare Other

## 2012-03-23 ENCOUNTER — Ambulatory Visit (HOSPITAL_COMMUNITY): Payer: Medicare Other

## 2012-03-23 ENCOUNTER — Encounter: Payer: Self-pay | Admitting: Surgery

## 2012-03-26 ENCOUNTER — Ambulatory Visit (INDEPENDENT_AMBULATORY_CARE_PROVIDER_SITE_OTHER): Payer: Medicare Other | Admitting: Surgery

## 2012-03-26 ENCOUNTER — Ambulatory Visit (HOSPITAL_COMMUNITY): Payer: Medicare Other

## 2012-03-26 ENCOUNTER — Encounter: Payer: Self-pay | Admitting: Surgery

## 2012-03-26 VITALS — BP 124/67 | HR 70 | Temp 98.8°F | Resp 16 | Ht 62.0 in | Wt 196.0 lb

## 2012-03-26 DIAGNOSIS — I6529 Occlusion and stenosis of unspecified carotid artery: Secondary | ICD-10-CM | POA: Insufficient documentation

## 2012-03-26 NOTE — Progress Notes (Signed)
[facility] [place]   Patient name: Deanna [name] MRN: [id number] DOB: Dec 01, 1940 Sex: female     Chief Complaint  Patient presents with  . Carotid    6 week f/u     HISTORY OF PRESENT ILLNESS: The patient comes back today for followup. She is status post left carotid subclavian bypass graft for left subclavian artery occlusion. This was done on Thursday, May 9. Her postoperative course was initially uncomplicated. The weekend following her operation she came back to the hospital with an acute MI. She also developed worsening swelling and erythema from her incision site. Medical management was the plan  for her coronary disease. She was taken back to the operating room on 5-17 for re-exploration of her left neck incision. I found a lymphatic leak from just beneath the  platysmal flap that was created. This was ligated. A drain was placed. Her erythema resolved. She was ultimately discharged from the hospital. When I last saw her in the office she was doing very well however she was having some cognitive issues. At that time she was living in the [facility] and had not yet returned home. I wanted her to come back today for continued followup to make sure that she makes the transition at home without difficulty.  The patient is now living at home. She still has trouble with recall of objects and is still complaining of fatigue and decreased energy but she is managing to be self-sufficient. She describes 2 episodes of numbness in her hand which were no where near as bad as they were preoperatively.   Past Medical History  Diagnosis Date  . Hypertension   . Colon polyps   . Hemorrhoids   . Hypothyroidism   . GERD (gastroesophageal reflux disease)   . Anxiety   . Depression   . Myocardial infarction   . CAD (coronary artery disease)     followed by dr [name].  . Sinus drainage     took z-pack   finished yesterday    Past Surgical History  Procedure Date  .  Fem-fem bypass graft   . Coronary artery bypass graft   . 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 Coronary angioplasty   . Joint replacement   . Carotid-subclavian bypass graft 12/15/2011    Procedure: BYPASS GRAFT CAROTID-SUBCLAVIAN;  Surgeon: Serafina Mitchell, MD;  Location: [facility] OR;  Service: Vascular;  Laterality: Left;  Left Carotid subclavian bypass    History   Social History  . Marital Status: Divorced    Spouse Name: N/A    Number of Children: N/A  . Years of Education: N/A   Occupational History  . Not on file.   Social History Main Topics  . Smoking status: Former Smoker    Types: Cigarettes    Quit date: 11/27/1981  . Smokeless tobacco: Never Used  . Alcohol Use: No  . Drug Use: No  . Sexually Active: Not on file   Other Topics Concern  . Not on file   Social History Narrative  . No narrative on file    Family History  Problem Relation Age of Onset  . Colon cancer Brother   . Heart attack Brother   . Hyperlipidemia Brother   . Hypertension Brother   . Heart disease Brother   . Heart attack Mother   . Diabetes Father   . Heart disease Father   . Hypertension Father   . Hyperlipidemia Father   . Heart attack Father   .  Diabetes Sister   . Heart disease Sister   . Hyperlipidemia Sister   . Hypertension Sister   . Heart attack Sister     Allergies as of 03/26/2012 - Review Complete 03/26/2012  Allergen Reaction Noted  . Vicodin (hydrocodone-acetaminophen) Other (See Comments) 11/28/2011    Current Outpatient Prescriptions on File Prior to Visit  Medication Sig Dispense Refill  . amLODipine (NORVASC) 5 MG tablet Take 5 mg by mouth daily.      Marland Kitchen aspirin 81 MG tablet Take 81 mg by mouth daily.       . Atorvastatin Calcium (LIPITOR PO) Take by mouth daily.      . Cholecalciferol (VITAMIN D-3 PO) Take 1 tablet by mouth daily.      . ClonazePAM (KLONOPIN PO) Take by mouth 2 (two) times daily.      . clopidogrel (PLAVIX) 75 MG tablet Take 75 mg by mouth  daily.      . ferrous sulfate 325 (65 FE) MG tablet Take 1 tablet (325 mg total) by mouth daily with breakfast.  30 tablet  0  . levothyroxine (SYNTHROID, LEVOTHROID) 175 MCG tablet Take 175 mcg by mouth daily.      Marland Kitchen LORazepam (ATIVAN) 1 MG tablet Take 0.5 tablets (0.5 mg total) by mouth as needed for anxiety. For aniexty  30 tablet  0  . meclizine (ANTIVERT) 12.5 MG tablet Take 12.5 mg by mouth 3 (three) times daily as needed. For dizziness      . metoprolol (LOPRESSOR) 50 MG tablet Take 50 mg by mouth 2 (two) times daily.      Marland Kitchen omeprazole (PRILOSEC) 20 MG capsule Take 20 mg by mouth daily.      . rosuvastatin (CRESTOR) 10 MG tablet Take 10 mg by mouth daily.      . sertraline (ZOLOFT) 100 MG tablet Take 50-100 mg by mouth 2 (two) times daily. Take 1 tablet in the morning and 1/2 a tablet in the evening      . valsartan (DIOVAN) 80 MG tablet Take 80 mg by mouth daily.      . vitamin B-12 (CYANOCOBALAMIN) 1000 MCG tablet Take 1,000 mcg by mouth daily.      . promethazine (PHENERGAN) 12.5 MG tablet Take 1 tablet (12.5 mg total) by mouth every 6 (six) hours as needed for nausea.  30 tablet  0  . zolpidem (AMBIEN) 5 MG tablet Take 1 tablet (5 mg total) by mouth at bedtime as needed for sleep (insomnia).  30 tablet  0     REVIEW OF SYSTEMS: No change from prior visit  PHYSICAL EXAMINATION:   Vital signs are BP 124/67  Pulse 70  Temp 98.8 F (37.1 C) (Oral)  Resp 16  Ht 5\' 2"  (1.575 m)  Wt 196 lb (88.905 kg)  BMI 35.85 kg/m2  SpO2 100% General: The patient appears their stated age. HEENT:  No gross abnormalities Pulmonary:  Non labored breathing Musculoskeletal: There are no major deformities. Neurologic: No focal weakness or paresthesias are detected, Skin: There are no ulcer or rashes noted. Psychiatric: The patient has normal affect. Cardiovascular: Palpable left brachial and radial pulse. The left neck incision is well-healed   Diagnostic Studies None  Assessment: Status  post left carotid subclavian bypass Plan: The patient has continued to make significant progress, although she is still not back to her baseline. Her bypass graft is patent, based on the fact that she has a palpable radial pulse. She has described 2 episodes of numbness in  her left hand. I'm not sure what to attribute this to because I feel like her bypass is widely patent. I have reviewed her outside carotid ultrasound and this shows a 50-69% right carotid stenosis. Based on her description of the numbness this does not sound like a TIA. Therefore, I have asked the patient to continue to monitor this if he gets worse he can be further evaluated. Otherwise I am and have her come back and have a repeat carotid ultrasound to evaluate her bypass graft in 3 months. Hopefully her symptoms are related more to scar tissue and will resolve with time. Her foot she has experienced within the last 2-3 months I would be reluctant to offer her any procedure at this time. The patient and her daughter are agreeable with this plan.  Eldridge Abrahams, M.D. [facility] [place] Office: [phone] Pager:  [phone]

## 2012-03-28 ENCOUNTER — Ambulatory Visit (HOSPITAL_COMMUNITY): Payer: Medicare Other

## 2012-03-30 ENCOUNTER — Ambulatory Visit (HOSPITAL_COMMUNITY): Payer: Medicare Other

## 2012-04-02 ENCOUNTER — Ambulatory Visit (HOSPITAL_COMMUNITY): Payer: Medicare Other

## 2012-04-04 ENCOUNTER — Ambulatory Visit (HOSPITAL_COMMUNITY): Payer: Medicare Other

## 2012-04-06 ENCOUNTER — Ambulatory Visit (HOSPITAL_COMMUNITY): Payer: Medicare Other

## 2012-04-09 ENCOUNTER — Ambulatory Visit (HOSPITAL_COMMUNITY): Payer: Medicare Other

## 2012-04-11 ENCOUNTER — Ambulatory Visit (HOSPITAL_COMMUNITY): Payer: Medicare Other

## 2012-04-13 ENCOUNTER — Ambulatory Visit (HOSPITAL_COMMUNITY): Payer: Medicare Other

## 2012-04-16 ENCOUNTER — Ambulatory Visit (HOSPITAL_COMMUNITY): Payer: Medicare Other

## 2012-04-18 ENCOUNTER — Ambulatory Visit (HOSPITAL_COMMUNITY): Payer: Medicare Other

## 2012-04-20 ENCOUNTER — Ambulatory Visit (HOSPITAL_COMMUNITY): Payer: Medicare Other

## 2012-04-23 ENCOUNTER — Ambulatory Visit (HOSPITAL_COMMUNITY): Payer: Medicare Other

## 2012-04-25 ENCOUNTER — Ambulatory Visit (HOSPITAL_COMMUNITY): Payer: Medicare Other

## 2012-04-27 ENCOUNTER — Ambulatory Visit (HOSPITAL_COMMUNITY): Payer: Medicare Other

## 2012-04-30 ENCOUNTER — Ambulatory Visit (HOSPITAL_COMMUNITY): Payer: Medicare Other

## 2012-05-02 ENCOUNTER — Ambulatory Visit (HOSPITAL_COMMUNITY): Payer: Medicare Other

## 2012-05-04 ENCOUNTER — Ambulatory Visit (HOSPITAL_COMMUNITY): Payer: Medicare Other

## 2012-05-07 ENCOUNTER — Ambulatory Visit (HOSPITAL_COMMUNITY): Payer: Medicare Other

## 2012-05-09 ENCOUNTER — Ambulatory Visit (HOSPITAL_COMMUNITY): Payer: Medicare Other

## 2012-05-11 ENCOUNTER — Ambulatory Visit (HOSPITAL_COMMUNITY): Payer: Medicare Other

## 2012-05-14 ENCOUNTER — Ambulatory Visit (HOSPITAL_COMMUNITY): Payer: Medicare Other

## 2012-05-16 ENCOUNTER — Ambulatory Visit (HOSPITAL_COMMUNITY): Payer: Medicare Other

## 2012-05-18 ENCOUNTER — Ambulatory Visit (HOSPITAL_COMMUNITY): Payer: Medicare Other

## 2012-05-21 ENCOUNTER — Ambulatory Visit (HOSPITAL_COMMUNITY): Payer: Medicare Other

## 2012-05-22 ENCOUNTER — Other Ambulatory Visit: Payer: Self-pay | Admitting: *Deleted

## 2012-05-22 DIAGNOSIS — Z48812 Encounter for surgical aftercare following surgery on the circulatory system: Secondary | ICD-10-CM

## 2012-05-22 DIAGNOSIS — I6529 Occlusion and stenosis of unspecified carotid artery: Secondary | ICD-10-CM

## 2012-05-23 ENCOUNTER — Ambulatory Visit (HOSPITAL_COMMUNITY): Payer: Medicare Other

## 2012-05-25 ENCOUNTER — Ambulatory Visit (HOSPITAL_COMMUNITY): Payer: Medicare Other

## 2012-05-28 ENCOUNTER — Ambulatory Visit (HOSPITAL_COMMUNITY): Payer: Medicare Other

## 2012-05-30 ENCOUNTER — Ambulatory Visit (HOSPITAL_COMMUNITY): Payer: Medicare Other

## 2012-06-01 ENCOUNTER — Ambulatory Visit (HOSPITAL_COMMUNITY): Payer: Medicare Other

## 2012-06-04 ENCOUNTER — Ambulatory Visit (HOSPITAL_COMMUNITY): Payer: Medicare Other

## 2012-06-06 ENCOUNTER — Ambulatory Visit (HOSPITAL_COMMUNITY): Payer: Medicare Other

## 2012-06-08 ENCOUNTER — Ambulatory Visit (HOSPITAL_COMMUNITY): Payer: Medicare Other

## 2012-06-11 ENCOUNTER — Ambulatory Visit (HOSPITAL_COMMUNITY): Payer: Medicare Other

## 2012-06-13 ENCOUNTER — Ambulatory Visit (HOSPITAL_COMMUNITY): Payer: Medicare Other

## 2012-06-15 ENCOUNTER — Ambulatory Visit (HOSPITAL_COMMUNITY): Payer: Medicare Other

## 2012-07-02 ENCOUNTER — Other Ambulatory Visit: Payer: Medicare Other

## 2012-07-02 ENCOUNTER — Ambulatory Visit: Payer: Medicare Other | Admitting: Surgery

## 2012-07-04 ENCOUNTER — Encounter: Payer: Self-pay | Admitting: Surgery

## 2012-07-09 ENCOUNTER — Other Ambulatory Visit: Payer: Medicare Other

## 2012-07-09 ENCOUNTER — Ambulatory Visit: Payer: Medicare Other | Admitting: Surgery

## 2012-07-27 ENCOUNTER — Encounter: Payer: Self-pay | Admitting: Surgery

## 2012-07-30 ENCOUNTER — Ambulatory Visit: Payer: Medicare Other | Admitting: Surgery

## 2012-07-30 ENCOUNTER — Other Ambulatory Visit: Payer: Medicare Other

## 2012-08-31 ENCOUNTER — Encounter: Payer: Self-pay | Admitting: Surgery

## 2012-09-03 ENCOUNTER — Other Ambulatory Visit (INDEPENDENT_AMBULATORY_CARE_PROVIDER_SITE_OTHER): Payer: Medicare Other | Admitting: *Deleted

## 2012-09-03 ENCOUNTER — Ambulatory Visit (INDEPENDENT_AMBULATORY_CARE_PROVIDER_SITE_OTHER): Payer: Medicare Other | Admitting: Surgery

## 2012-09-03 ENCOUNTER — Encounter: Payer: Self-pay | Admitting: Surgery

## 2012-09-03 VITALS — BP 130/73 | HR 73 | Resp 16 | Wt 188.0 lb

## 2012-09-03 DIAGNOSIS — I6529 Occlusion and stenosis of unspecified carotid artery: Secondary | ICD-10-CM

## 2012-09-03 DIAGNOSIS — Z48812 Encounter for surgical aftercare following surgery on the circulatory system: Secondary | ICD-10-CM

## 2012-09-03 DIAGNOSIS — M79609 Pain in unspecified limb: Secondary | ICD-10-CM | POA: Insufficient documentation

## 2012-09-03 NOTE — Progress Notes (Signed)
[facility] [place]   Patient name: Deanna [name] MRN: [id number] DOB: [birth year] Sex: female     Chief Complaint  Patient presents with  . Re-evaluation    3 months f/u C/O Left arm falls asleep 2 wk. duration.  Painful  Hips, legs  with walking, duration 4-5 months.    HISTORY OF PRESENT ILLNESS: The patient is back today for followup. She has a history of undergoing left carotid to subclavian artery bypass graft on 12/15/2011. This was done in the setting of dizziness and left arm numbness. Her postoperative course was complicated by a seroma which became infected and required drainage. Ultimately she recovered and had been doing well until recently when she began having numbness in her left arm periodically at night. She also reports some dizziness. She reports no traumatic events to her arm. She does not sleep on her has not done anything that would compromise the arm.  Past Medical History  Diagnosis Date  . Hypertension   . Colon polyps   . Hemorrhoids   . Hypothyroidism   . GERD (gastroesophageal reflux disease)   . Anxiety   . Depression   . Myocardial infarction   . CAD (coronary artery disease)     followed by dr [name].  . Sinus drainage     took z-pack   finished yesterday    Past Surgical History  Procedure Date  . Fem-fem bypass graft   . Coronary artery bypass graft   . 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 Coronary angioplasty   . Carotid-subclavian bypass graft 12/15/2011    Procedure: BYPASS GRAFT CAROTID-SUBCLAVIAN;  Surgeon: Serafina Mitchell, MD;  Location: [facility] OR;  Service: Vascular;  Laterality: Left;  Left Carotid subclavian bypass  . Joint replacement     Left knee    History   Social History  . Marital Status: Divorced    Spouse Name: N/A    Number of Children: N/A  . Years of Education: N/A   Occupational History  . Not on file.   Social History Main Topics  . Smoking status: Former Smoker    Types: Cigarettes   Quit date: 11/27/1981  . Smokeless tobacco: Never Used  . Alcohol Use: No  . Drug Use: No  . Sexually Active: Not on file   Other Topics Concern  . Not on file   Social History Narrative  . No narrative on file    Family History  Problem Relation Age of Onset  . Colon cancer Brother   . Heart attack Brother   . Hyperlipidemia Brother   . Hypertension Brother   . Heart disease Brother   . Heart attack Mother   . Diabetes Father   . Heart disease Father   . Hypertension Father   . Hyperlipidemia Father   . Heart attack Father   . Diabetes Sister   . Heart disease Sister   . Hyperlipidemia Sister   . Hypertension Sister   . Heart attack Sister     Allergies as of 09/03/2012 - Review Complete 09/03/2012  Allergen Reaction Noted  . Vicodin (hydrocodone-acetaminophen) Other (See Comments) 11/28/2011    Current Outpatient Prescriptions on File Prior to Visit  Medication Sig Dispense Refill  . amLODipine (NORVASC) 5 MG tablet Take 5 mg by mouth daily.      Marland Kitchen aspirin 81 MG tablet Take 81 mg by mouth daily.       . Atorvastatin Calcium (LIPITOR PO) Take by mouth daily.      Marland Kitchen  Cholecalciferol (VITAMIN D-3 PO) Take 1 tablet by mouth daily.      . clopidogrel (PLAVIX) 75 MG tablet Take 75 mg by mouth daily.      . ferrous sulfate 325 (65 FE) MG tablet Take 1 tablet (325 mg total) by mouth daily with breakfast.  30 tablet  0  . levothyroxine (SYNTHROID, LEVOTHROID) 175 MCG tablet Take 175 mcg by mouth daily.      Marland Kitchen LORazepam (ATIVAN) 1 MG tablet Take 0.5 tablets (0.5 mg total) by mouth as needed for anxiety. For aniexty  30 tablet  0  . meclizine (ANTIVERT) 12.5 MG tablet Take 12.5 mg by mouth 3 (three) times daily as needed. For dizziness      . metoprolol (LOPRESSOR) 50 MG tablet Take 50 mg by mouth 2 (two) times daily.      . promethazine (PHENERGAN) 12.5 MG tablet Take 12.5 mg by mouth every 6 (six) hours as needed.      . rosuvastatin (CRESTOR) 10 MG tablet Take 10 mg by  mouth daily.      . sertraline (ZOLOFT) 100 MG tablet Take 50-100 mg by mouth 2 (two) times daily. Take 1 tablet in the morning and 1/2 a tablet in the evening      . valsartan (DIOVAN) 80 MG tablet Take 80 mg by mouth daily.      . vitamin B-12 (CYANOCOBALAMIN) 1000 MCG tablet Take 1,000 mcg by mouth daily.      Marland Kitchen zolpidem (AMBIEN) 5 MG tablet Take 1 tablet (5 mg total) by mouth at bedtime as needed for sleep (insomnia).  30 tablet  0  . ClonazePAM (KLONOPIN PO) Take by mouth 2 (two) times daily.      Marland Kitchen omeprazole (PRILOSEC) 20 MG capsule Take 20 mg by mouth daily.         REVIEW OF SYSTEMS: See history of present illness, otherwise positive for pain in her legs in both hips for 4-5 months. Although systems negative  PHYSICAL EXAMINATION:   Vital signs are BP 130/73  Pulse 73  Resp 16  Wt 188 lb (85.276 kg)  SpO2 97% General: The patient appears their stated age. HEENT:  No gross abnormalities Pulmonary:  Non labored breathing Musculoskeletal: There are no major deformities. Neurologic: No focal weakness or paresthesias are detected, Skin: There are no ulcer or rashes noted. Psychiatric: The patient has normal affect. Cardiovascular: There is a regular rate and rhythm without significant murmur appreciated. 2+ right radial, 1+ left radial pulse. Positive bruit in the left supraclavicular area   Diagnostic Studies Duplex ultrasound today shows that her bypass graft is patent. There is however turbulent flow noted at the anastomosis with velocities of 295 cm/s. 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  Assessment: Status post left carotid to subclavian bypass Plan: The patient has had return of some of the symptoms that led to her initial carotid subclavian bypass graft. In addition, ultrasound findings show elevated velocities at her anastomosis. For that reason I think this needs to be further evaluated by angiography. Because she has had a  femoral-femoral bypass graft many years ago I will come from the left brachial approach. This is probably most appropriate given that she had a bypass. This would probably enable the best approach to potentially intervene. This is been scheduled for Tuesday, February 4  V. [name], M.D. [facility] [place] Office: [phone] Pager:  [phone]

## 2012-09-06 ENCOUNTER — Other Ambulatory Visit: Payer: Self-pay

## 2012-09-07 ENCOUNTER — Encounter (HOSPITAL_COMMUNITY): Payer: Self-pay | Admitting: Pharmacy Technician

## 2012-09-10 MED ORDER — SODIUM CHLORIDE 0.9 % IV SOLN: INTRAVENOUS | Status: DC

## 2012-09-11 ENCOUNTER — Other Ambulatory Visit: Payer: Self-pay | Admitting: *Deleted

## 2012-09-11 ENCOUNTER — Ambulatory Visit (HOSPITAL_COMMUNITY)
Admission: RE | Admit: 2012-09-11 | Discharge: 2012-09-11 | Disposition: A | Discharge status: Home / Self Care | Payer: Medicare Other | Source: Ambulatory Visit | Attending: Surgery | Admitting: Surgery

## 2012-09-11 ENCOUNTER — Telehealth: Payer: Self-pay | Admitting: Surgery

## 2012-09-11 ENCOUNTER — Encounter (HOSPITAL_COMMUNITY)
Admission: RE | Disposition: A | Discharge status: Home / Self Care | Payer: Self-pay | Source: Ambulatory Visit | Attending: Surgery

## 2012-09-11 DIAGNOSIS — I6529 Occlusion and stenosis of unspecified carotid artery: Secondary | ICD-10-CM

## 2012-09-11 DIAGNOSIS — I10 Essential (primary) hypertension: Secondary | ICD-10-CM | POA: Insufficient documentation

## 2012-09-11 DIAGNOSIS — R209 Unspecified disturbances of skin sensation: Secondary | ICD-10-CM | POA: Insufficient documentation

## 2012-09-11 DIAGNOSIS — Z9889 Other specified postprocedural states: Secondary | ICD-10-CM | POA: Insufficient documentation

## 2012-09-11 DIAGNOSIS — I771 Stricture of artery: Secondary | ICD-10-CM

## 2012-09-11 HISTORY — PX: BILATERAL UPPER EXTREMITY ANGIOGRAM: SHX5503

## 2012-09-11 LAB — POCT I-STAT, CHEM 8
BUN: 20 mg/dL (ref 6–23)
Calcium, Ion: 1.23 mmol/L (ref 1.13–1.30)
Hemoglobin: 15.6 g/dL — ABNORMAL HIGH (ref 12.0–15.0)
TCO2: 26 mmol/L (ref 0–100)

## 2012-09-11 SURGERY — BILATERAL UPPER EXTREMITY ANGIOGRAM
Anesthesia: LOCAL

## 2012-09-11 MED ORDER — ALUM & MAG HYDROXIDE-SIMETH 200-200-20 MG/5ML PO SUSP
15.0000 mL | ORAL | Status: DC | PRN
  Filled 2012-09-11: qty 30

## 2012-09-11 MED ORDER — GUAIFENESIN-DM 100-10 MG/5ML PO SYRP
15.0000 mL | ORAL_SOLUTION | ORAL | Status: DC | PRN
  Filled 2012-09-11: qty 15

## 2012-09-11 MED ORDER — HYDRALAZINE HCL 20 MG/ML IJ SOLN: 10.0000 mg | INTRAMUSCULAR | Status: DC | PRN

## 2012-09-11 MED ORDER — PHENOL 1.4 % MT LIQD
1.0000 | OROMUCOSAL | Status: DC | PRN
  Filled 2012-09-11: qty 177

## 2012-09-11 MED ORDER — TRAMADOL HCL 50 MG PO TABS
50.0000 mg | ORAL_TABLET | Freq: Four times a day (QID) | ORAL | Status: DC | PRN
  Administered 2012-09-11: 50 mg via ORAL
  Filled 2012-09-11: qty 1

## 2012-09-11 MED ORDER — FENTANYL CITRATE 0.05 MG/ML IJ SOLN
INTRAMUSCULAR | Status: AC
  Filled 2012-09-11: qty 2

## 2012-09-11 MED ORDER — METOPROLOL TARTRATE 1 MG/ML IV SOLN: 2.0000 mg | INTRAVENOUS | Status: DC | PRN

## 2012-09-11 MED ORDER — MIDAZOLAM HCL 2 MG/2ML IJ SOLN
INTRAMUSCULAR | Status: AC
  Filled 2012-09-11: qty 2

## 2012-09-11 MED ORDER — MORPHINE SULFATE 10 MG/ML IJ SOLN
2.0000 mg | INTRAMUSCULAR | Status: DC | PRN
Start: 2012-09-11 — End: 2012-09-11

## 2012-09-11 MED ORDER — ONDANSETRON HCL 4 MG/2ML IJ SOLN: 4.0000 mg | Freq: Four times a day (QID) | INTRAMUSCULAR | Status: DC | PRN

## 2012-09-11 MED ORDER — LIDOCAINE HCL (PF) 1 % IJ SOLN
INTRAMUSCULAR | Status: AC
  Filled 2012-09-11: qty 30

## 2012-09-11 MED ORDER — SODIUM CHLORIDE 0.9 % IV SOLN: 1.0000 mL/kg/h | INTRAVENOUS | Status: DC

## 2012-09-11 MED ORDER — LABETALOL HCL 5 MG/ML IV SOLN: 10.0000 mg | INTRAVENOUS | Status: DC | PRN

## 2012-09-11 NOTE — Telephone Encounter (Addendum)
Message copied by Lujean Amel on Tue Sep 11, 2012  1:15 PM ------      Message from: Alfonso Patten      Created: Tue Sep 11, 2012  9:21 AM                   ----- Message -----         From: Serafina Mitchell, MD         Sent: 09/11/2012   8:40 AM           To: Geraldo Pitter, RN            09/11/2012:            Procedure Performed:       1.  ultrasound-guided access, left brachial artery       2.  second order catheterization (left common carotid artery       3.  left carotid angiogram       4.  left upper extremity angiogram            Please schedule the patient for followup in 3 months with a duplex of her carotid subclavian bypass graft and carotid duplex  I scheduled an appt for the above pt on 12/10/12 at 1pm w/ [facility]. I mailed an appt letter and left message for the pt/awt

## 2012-09-11 NOTE — Op Note (Signed)
[facility] [place]  Patient name: Deanna [name] MRN: [id number] DOB: 1941-04-08 Sex: female  09/11/2012 Pre-operative Diagnosis: Rule out bypass graft stenosis, left carotid to left subclavian artery Post-operative diagnosis:  Same Surgeon:  Eldridge Abrahams Procedure Performed:  1.  ultrasound-guided access, left brachial artery  2.  second order catheterization (left common carotid artery  3.  left carotid angiogram  4.  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 She has recently had some left upper extremity symptoms. Ultrasound indicated a possible stenosis at her carotid anastomosis. She comes in today for further evaluation and possible intervention  Procedure:  The patient was identified in the holding area and taken to room 8.  The patient was then placed supine on the table and prepped and draped in the usual sterile fashion.  A time out was called.  Ultrasound was used to evaluate the left brachial artery. It was widely patent, easily compressible without calcification. One percent lidocaine was used for local anesthesia. The left brachial artery was then cannulated under ultrasound guidance with a micropuncture needle. An 8018 wire was advanced without resistance and a micropuncture sheath was placed. Next, an Norton wire was placed under fluoroscopic visualization, followed by exchanging of the micropuncture sheath for a 5 French sheath. 2000 units of heparin and 200 mcg of nitroglycerin were then administered through the sheath. Next the Kumpe catheter was advanced over the wire into the proximal left common carotid artery and angiographic images of the left common carotid artery, the bypass graft with both anastomoses were obtained in multiple obliquities. Next the catheter was withdrawn into the proximal subclavian artery and a left upper extremity  angiogram was performed Findings:   Left carotid angiogram:  The left common carotid artery was evaluated from it's origin up to the bifurcation. No significant stenosis is identified within the left common carotid artery. A left carotid to subclavian bypass graft is visualized. The anastomosis and both the common carotid and left subclavian artery are widely patent without evidence of stenosis.  Left arm angiogram:  The proximal left subclavian artery is occluded. There is retrograde filling of a very large left vertebral artery which does not have any evidence of stenosis. The anastomosis to the left subclavian artery is widely patent. The axillary and brachial artery are widely patent down to the point of access. At this level spasm is identified which causes a temporary occlusion. There is reconstitution of the radial and ulnar arteries which are visualized down to the wrist.   Intervention:  Intra-arterial administration of nitroglycerin was utilized to assist with the spasm at the access site. Pullback pressures across both the carotid and the subclavian artery anastomoses were performed. There was no change in gradient across the anastomosis  Impression:  #1  no evidence of stenosis at either the carotid or the subclavian anastomosis  #2  widely patent subclavian and brachial arteries. The ulnar and radial artery are also patent. Significant spasm and obstruction from the sheath prevents adequate visualization of the brachial artery in the antecubital region, however during access of the artery, and this area was pulsatile and showed no evidence of stenosis or occlusion   V. Annamarie Major, M.D. [facility] [place] Office: [phone] Pager:  [phone]

## 2012-09-12 NOTE — Interval H&P Note (Signed)
History and Physical Interval Note:  09/12/2012 9:39 PM  Deanna [name]  has presented today for surgery, with the diagnosis of stenosis  The various methods of treatment have been discussed with the patient and family. After consideration of risks, benefits and other options for treatment, the patient has consented to  Procedure(s) (LRB) with comments: BILATERAL UPPER EXTREMITY ANGIOGRAM (N/A) as a surgical intervention .  The patient's history has been reviewed, patient examined, no change in status, stable for surgery.  I have reviewed the patient's chart and labs.  Questions were answered to the patient's satisfaction.     Corwyn Vora IV, V. WELLS

## 2012-09-12 NOTE — H&P (View-Only) (Signed)
[facility] [place]   Patient name: Deanna [name] MRN: [id number] DOB: [birth year] Sex: female     Chief Complaint  Patient presents with  . Re-evaluation    3 months f/u C/O Left arm falls asleep 2 wk. duration.  Painful  Hips, legs  with walking, duration 4-5 months.    HISTORY OF PRESENT ILLNESS: The patient is back today for followup. She has a history of undergoing left carotid to subclavian artery bypass graft on 12/15/2011. This was done in the setting of dizziness and left arm numbness. Her postoperative course was complicated by a seroma which became infected and required drainage. Ultimately she recovered and had been doing well until recently when she began having numbness in her left arm periodically at night. She also reports some dizziness. She reports no traumatic events to her arm. She does not sleep on her has not done anything that would compromise the arm.  Past Medical History  Diagnosis Date  . Hypertension   . Colon polyps   . Hemorrhoids   . Hypothyroidism   . GERD (gastroesophageal reflux disease)   . Anxiety   . Depression   . Myocardial infarction   . CAD (coronary artery disease)     followed by dr [name].  . Sinus drainage     took z-pack   finished yesterday    Past Surgical History  Procedure Date  . Fem-fem bypass graft   . Coronary artery bypass graft   . 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 Coronary angioplasty   . Carotid-subclavian bypass graft 12/15/2011    Procedure: BYPASS GRAFT CAROTID-SUBCLAVIAN;  Surgeon: Serafina Mitchell, MD;  Location: [facility] OR;  Service: Vascular;  Laterality: Left;  Left Carotid subclavian bypass  . Joint replacement     Left knee    History   Social History  . Marital Status: Divorced    Spouse Name: N/A    Number of Children: N/A  . Years of Education: N/A   Occupational History  . Not on file.   Social History Main Topics  . Smoking status: Former Smoker    Types: Cigarettes   Quit date: 11/27/1981  . Smokeless tobacco: Never Used  . Alcohol Use: No  . Drug Use: No  . Sexually Active: Not on file   Other Topics Concern  . Not on file   Social History Narrative  . No narrative on file    Family History  Problem Relation Age of Onset  . Colon cancer Brother   . Heart attack Brother   . Hyperlipidemia Brother   . Hypertension Brother   . Heart disease Brother   . Heart attack Mother   . Diabetes Father   . Heart disease Father   . Hypertension Father   . Hyperlipidemia Father   . Heart attack Father   . Diabetes Sister   . Heart disease Sister   . Hyperlipidemia Sister   . Hypertension Sister   . Heart attack Sister     Allergies as of 09/03/2012 - Review Complete 09/03/2012  Allergen Reaction Noted  . Vicodin (hydrocodone-acetaminophen) Other (See Comments) 11/28/2011    Current Outpatient Prescriptions on File Prior to Visit  Medication Sig Dispense Refill  . amLODipine (NORVASC) 5 MG tablet Take 5 mg by mouth daily.      Marland Kitchen aspirin 81 MG tablet Take 81 mg by mouth daily.       . Atorvastatin Calcium (LIPITOR PO) Take by mouth daily.      Marland Kitchen  Cholecalciferol (VITAMIN D-3 PO) Take 1 tablet by mouth daily.      . clopidogrel (PLAVIX) 75 MG tablet Take 75 mg by mouth daily.      . ferrous sulfate 325 (65 FE) MG tablet Take 1 tablet (325 mg total) by mouth daily with breakfast.  30 tablet  0  . levothyroxine (SYNTHROID, LEVOTHROID) 175 MCG tablet Take 175 mcg by mouth daily.      Marland Kitchen LORazepam (ATIVAN) 1 MG tablet Take 0.5 tablets (0.5 mg total) by mouth as needed for anxiety. For aniexty  30 tablet  0  . meclizine (ANTIVERT) 12.5 MG tablet Take 12.5 mg by mouth 3 (three) times daily as needed. For dizziness      . metoprolol (LOPRESSOR) 50 MG tablet Take 50 mg by mouth 2 (two) times daily.      . promethazine (PHENERGAN) 12.5 MG tablet Take 12.5 mg by mouth every 6 (six) hours as needed.      . rosuvastatin (CRESTOR) 10 MG tablet Take 10 mg by  mouth daily.      . sertraline (ZOLOFT) 100 MG tablet Take 50-100 mg by mouth 2 (two) times daily. Take 1 tablet in the morning and 1/2 a tablet in the evening      . valsartan (DIOVAN) 80 MG tablet Take 80 mg by mouth daily.      . vitamin B-12 (CYANOCOBALAMIN) 1000 MCG tablet Take 1,000 mcg by mouth daily.      Marland Kitchen zolpidem (AMBIEN) 5 MG tablet Take 1 tablet (5 mg total) by mouth at bedtime as needed for sleep (insomnia).  30 tablet  0  . ClonazePAM (KLONOPIN PO) Take by mouth 2 (two) times daily.      Marland Kitchen omeprazole (PRILOSEC) 20 MG capsule Take 20 mg by mouth daily.         REVIEW OF SYSTEMS: See history of present illness, otherwise positive for pain in her legs in both hips for 4-5 months. Although systems negative  PHYSICAL EXAMINATION:   Vital signs are BP 130/73  Pulse 73  Resp 16  Wt 188 lb (85.276 kg)  SpO2 97% General: The patient appears their stated age. HEENT:  No gross abnormalities Pulmonary:  Non labored breathing Musculoskeletal: There are no major deformities. Neurologic: No focal weakness or paresthesias are detected, Skin: There are no ulcer or rashes noted. Psychiatric: The patient has normal affect. Cardiovascular: There is a regular rate and rhythm without significant murmur appreciated. 2+ right radial, 1+ left radial pulse. Positive bruit in the left supraclavicular area   Diagnostic Studies Duplex ultrasound today shows that her bypass graft is patent. There is however turbulent flow noted at the anastomosis with velocities of 295 cm/s. 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  Assessment: Status post left carotid to subclavian bypass Plan: The patient has had return of some of the symptoms that led to her initial carotid subclavian bypass graft. In addition, ultrasound findings show elevated velocities at her anastomosis. For that reason I think this needs to be further evaluated by angiography. Because she has had a  femoral-femoral bypass graft many years ago I will come from the left brachial approach. This is probably most appropriate given that she had a bypass. This would probably enable the best approach to potentially intervene. This is been scheduled for Tuesday, February 4  V. [name], M.D. [facility] [place] Office: [phone] Pager:  [phone]

## 2012-11-13 ENCOUNTER — Encounter: Payer: Self-pay | Admitting: Internal Medicine

## 2012-11-22 ENCOUNTER — Other Ambulatory Visit (HOSPITAL_COMMUNITY): Payer: Self-pay | Admitting: Cardiovascular Disease

## 2012-11-22 ENCOUNTER — Other Ambulatory Visit: Payer: Self-pay | Admitting: Cardiovascular Disease

## 2012-11-22 DIAGNOSIS — R011 Cardiac murmur, unspecified: Secondary | ICD-10-CM

## 2012-11-22 DIAGNOSIS — R5381 Other malaise: Secondary | ICD-10-CM

## 2012-11-22 DIAGNOSIS — R413 Other amnesia: Secondary | ICD-10-CM

## 2012-11-22 DIAGNOSIS — I25810 Atherosclerosis of coronary artery bypass graft(s) without angina pectoris: Secondary | ICD-10-CM

## 2012-11-22 DIAGNOSIS — R5383 Other fatigue: Secondary | ICD-10-CM

## 2012-11-28 ENCOUNTER — Ambulatory Visit
Admission: RE | Admit: 2012-11-28 | Discharge: 2012-11-28 | Disposition: A | Discharge status: Home / Self Care | Payer: Medicare Other | Source: Ambulatory Visit | Attending: Cardiovascular Disease | Admitting: Cardiovascular Disease

## 2012-11-28 DIAGNOSIS — R5383 Other fatigue: Secondary | ICD-10-CM

## 2012-11-28 DIAGNOSIS — R413 Other amnesia: Secondary | ICD-10-CM

## 2012-11-29 ENCOUNTER — Ambulatory Visit (HOSPITAL_COMMUNITY)
Admission: RE | Admit: 2012-11-29 | Discharge: 2012-11-29 | Disposition: A | Discharge status: Home / Self Care | Payer: Medicare Other | Source: Ambulatory Visit | Attending: Cardiovascular Disease | Admitting: Cardiovascular Disease

## 2012-11-29 DIAGNOSIS — R5381 Other malaise: Secondary | ICD-10-CM | POA: Insufficient documentation

## 2012-11-29 DIAGNOSIS — R5383 Other fatigue: Secondary | ICD-10-CM | POA: Insufficient documentation

## 2012-11-29 DIAGNOSIS — R011 Cardiac murmur, unspecified: Secondary | ICD-10-CM | POA: Insufficient documentation

## 2012-11-29 DIAGNOSIS — I25810 Atherosclerosis of coronary artery bypass graft(s) without angina pectoris: Secondary | ICD-10-CM

## 2012-11-29 NOTE — Progress Notes (Signed)
2D Echo Performed 11/29/2012    Marygrace Drought, RCS

## 2012-12-07 ENCOUNTER — Encounter: Payer: Self-pay | Admitting: Surgery

## 2012-12-10 ENCOUNTER — Ambulatory Visit: Payer: Medicare Other | Admitting: Surgery

## 2012-12-10 ENCOUNTER — Other Ambulatory Visit: Payer: Medicare Other

## 2013-02-02 ENCOUNTER — Other Ambulatory Visit: Payer: Self-pay | Admitting: Cardiovascular Disease

## 2013-02-04 NOTE — Telephone Encounter (Signed)
Rx was printed for RAW to sign and will be faxed once RAW signs off.

## 2013-02-06 ENCOUNTER — Other Ambulatory Visit: Payer: Self-pay | Admitting: Cardiovascular Disease

## 2013-02-06 NOTE — Telephone Encounter (Signed)
[place] to [facility]

## 2013-02-06 NOTE — Telephone Encounter (Signed)
[place] to [facility]

## 2013-02-14 ENCOUNTER — Other Ambulatory Visit: Payer: Self-pay | Admitting: Internal Medicine

## 2013-02-14 DIAGNOSIS — R110 Nausea: Secondary | ICD-10-CM

## 2013-02-20 ENCOUNTER — Ambulatory Visit
Admission: RE | Admit: 2013-02-20 | Discharge: 2013-02-20 | Disposition: A | Discharge status: Home / Self Care | Payer: Medicare Other | Source: Ambulatory Visit | Attending: Internal Medicine | Admitting: Internal Medicine

## 2013-02-20 DIAGNOSIS — R110 Nausea: Secondary | ICD-10-CM

## 2013-03-07 ENCOUNTER — Encounter: Payer: Self-pay | Admitting: Neurology

## 2013-03-07 ENCOUNTER — Ambulatory Visit (INDEPENDENT_AMBULATORY_CARE_PROVIDER_SITE_OTHER): Payer: Medicare Other | Admitting: Neurology

## 2013-03-07 VITALS — BP 140/60 | HR 70 | Ht 62.0 in | Wt 208.0 lb

## 2013-03-07 DIAGNOSIS — G4733 Obstructive sleep apnea (adult) (pediatric): Secondary | ICD-10-CM

## 2013-03-07 NOTE — Patient Instructions (Addendum)
Based on your symptoms and your exam I believe you are at risk for obstructive sleep apnea or OSA, and I think we should proceed with a sleep study to determine whether you do or do not have OSA and how severe it is. If you have more than mild OSA, I want you to consider treatment with CPAP. Please remember, the risks and ramifications of moderate to severe obstructive sleep apnea or OSA are: Cardiovascular disease, including congestive heart failure, stroke, difficult to control hypertension, arrhythmias, and even type 2 diabetes has been linked to untreated OSA. Sleep apnea causes disruption of sleep and sleep deprivation in most cases, which, in turn, can cause recurrent headaches, problems with memory, mood, concentration, focus, and vigilance. Most people with untreated sleep apnea report excessive daytime sleepiness, which can affect their ability to drive. Please do not drive if you feel sleepy.  I will see you back after your sleep study to go over the test results and where to go from there. We will call you after your sleep study and to set up an appointment at the time.   
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
## 2013-04-03 ENCOUNTER — Ambulatory Visit (INDEPENDENT_AMBULATORY_CARE_PROVIDER_SITE_OTHER): Payer: Medicare Other | Admitting: Neurology

## 2013-04-03 DIAGNOSIS — G4733 Obstructive sleep apnea (adult) (pediatric): Secondary | ICD-10-CM

## 2013-04-03 DIAGNOSIS — G4761 Periodic limb movement disorder: Secondary | ICD-10-CM

## 2013-04-03 DIAGNOSIS — R9431 Abnormal electrocardiogram [ECG] [EKG]: Secondary | ICD-10-CM

## 2013-04-17 ENCOUNTER — Telehealth: Payer: Self-pay | Admitting: Neurology

## 2013-04-17 ENCOUNTER — Encounter: Payer: Self-pay | Admitting: *Deleted

## 2013-04-17 DIAGNOSIS — G4733 Obstructive sleep apnea (adult) (pediatric): Secondary | ICD-10-CM

## 2013-04-17 NOTE — Telephone Encounter (Signed)
Please call and notify patient that the recent sleep study confirmed the diagnosis of OSA. She did well with CPAP during the study with significant improvement of the respiratory events. Therefore, I would like start the patient on CPAP at home. I placed the order in the chart.   Arrange for CPAP set up at home through a DME company of patient's choice and fax/route report to PCP and referring MD (if other than PCP).   The patient will also need a follow up appointment with me in 6-8 weeks post set up that has to be scheduled; help the patient schedule this (in a follow-up slot).   Please re-enforce the importance of compliance with treatment and the need for [place] to monitor compliance data.   Once you have spoken to the patient and scheduled the return appointment, you may close this encounter, thanks,   Star Age, MD, PhD [facility] ([place])

## 2013-04-17 NOTE — Telephone Encounter (Signed)
Called patient to discuss sleep study results.  Discussed findings, recommendations and follow up care.  Patient understood well and all questions were answered.   A copy of the sleep study interpretive report as well as a letter with info regarding contact info for the DME company, the importance of CPAP compliance, and the date of the follow up appointment info will be mailed to the patient's home. FOLLOW UP APPT WAS SCHEDULED FOR 05/29/2013 AT 10:30 AM WITH DR. Rexene Alberts.  She will need to schedule a 61-90 day post cpap visit around end of November/first part of December. Orders forwarded to [facility]. Copy of study faxed to Leanna Battles.

## 2013-04-17 NOTE — Sleep Study (Signed)
See media tab for full report  

## 2013-05-27 ENCOUNTER — Telehealth: Payer: Self-pay | Admitting: Neurology

## 2013-05-27 NOTE — Telephone Encounter (Signed)
Patient called to postpone her cpap follow up appointment until she can adjust to her mask.  She complains that it does not fit on her head properly.  I was unable to locate who the original dme was but told her that you were very good in this department and maybe you could advise her.  As of right now she says that she really can't use her machine effectively.

## 2013-05-29 ENCOUNTER — Telehealth: Payer: Self-pay | Admitting: Neurology

## 2013-05-29 ENCOUNTER — Institutional Professional Consult (permissible substitution): Payer: Medicare Other | Admitting: Neurology

## 2013-05-30 NOTE — Telephone Encounter (Signed)
Patient want to know how to turn machine on and how to put on  her head piece for her CPAP machine, will come to sleep lab sometimes today for instructions, she said that she has short term memory loss,can't remember.

## 2013-05-31 ENCOUNTER — Other Ambulatory Visit (HOSPITAL_COMMUNITY)
Admission: RE | Admit: 2013-05-31 | Discharge: 2013-05-31 | Disposition: A | Discharge status: Home / Self Care | Payer: Medicare Other | Source: Ambulatory Visit | Attending: Gynecology | Admitting: Gynecology

## 2013-05-31 ENCOUNTER — Telehealth: Payer: Self-pay | Admitting: *Deleted

## 2013-05-31 ENCOUNTER — Ambulatory Visit (INDEPENDENT_AMBULATORY_CARE_PROVIDER_SITE_OTHER): Payer: Medicare Other | Admitting: Gynecology

## 2013-05-31 ENCOUNTER — Encounter: Payer: Self-pay | Admitting: Gynecology

## 2013-05-31 VITALS — BP 126/80 | Ht 60.0 in | Wt 209.0 lb

## 2013-05-31 DIAGNOSIS — Z780 Asymptomatic menopausal state: Secondary | ICD-10-CM

## 2013-05-31 DIAGNOSIS — N952 Postmenopausal atrophic vaginitis: Secondary | ICD-10-CM

## 2013-05-31 DIAGNOSIS — R6889 Other general symptoms and signs: Secondary | ICD-10-CM

## 2013-05-31 DIAGNOSIS — R32 Unspecified urinary incontinence: Secondary | ICD-10-CM

## 2013-05-31 DIAGNOSIS — N9089 Other specified noninflammatory disorders of vulva and perineum: Secondary | ICD-10-CM

## 2013-05-31 DIAGNOSIS — Z124 Encounter for screening for malignant neoplasm of cervix: Secondary | ICD-10-CM | POA: Insufficient documentation

## 2013-05-31 DIAGNOSIS — IMO0002 Reserved for concepts with insufficient information to code with codable children: Secondary | ICD-10-CM

## 2013-05-31 NOTE — Telephone Encounter (Signed)
Message copied by Thamas Jaegers on Fri May 31, 2013  4:16 PM ------      Message from: Anastasio Auerbach      Created: Fri May 31, 2013  3:45 PM       Help patient arrange appointment with urology, Dr. Anders Grant reference urinary incontinence ------

## 2013-05-31 NOTE — Addendum Note (Signed)
Addended by: Nelva Nay on: 05/31/2013 04:09 PM   Modules accepted: Orders

## 2013-05-31 NOTE — Progress Notes (Signed)
Deanna [name] 1941/02/25 [id number]        72 y.o.  G1P1 for followup exam.  Has not been in the office is 2008. Several issues noted below.  Past medical history,surgical history, medications, allergies, family history and social history were all reviewed and documented in the EPIC chart.  ROS:  Performed and pertinent positives and negatives are included in the history, assessment and plan .  Exam: Kim assistant Filed Vitals:   05/31/13 1455  BP: 126/80  Height: 5' (1.524 m)  Weight: 209 lb (94.802 kg)   General appearance  Normal Skin grossly normal Head/Neck normal with no cervical or supraclavicular adenopathy thyroid normal Lungs  clear Cardiac RR, without RMG Abdominal  soft, nontender, without masses, organomegaly or hernia Breasts  examined lying and sitting without masses, retractions, discharge or axillary adenopathy. Pelvic  Ext/BUS/vagina  atrophic changes. Pedunculated fibroepithelial pigmented polyp left lower vulva at junction with side.  Cervix  normal atrophic, Pap done  Uterus  grossly normal size midline mobile nontender   Adnexa  Without masses or tenderness    Anus and perineum  normal   Rectovaginal  normal sphincter tone without palpated masses or tenderness.   Procedure: Skin overlying the vulvar lesion cleansed with Betadine, infiltrated with 1% lidocaine and the polypoid lesion was excised at its junction with the surrounding skin. Silver nitrate and sterile dressing applied afterwards. Specimen sent to pathology.   Assessment/Plan:  72 y.o. G1P1 female for followup exam.   1. Postmenopausal. Had been on HRT in the past. This is been discontinued and she is doing well off of this without significant hot flashes night sweats or vaginal dryness. Not sexually active. We'll continue to monitor at this time. 2. Vulvar lesion. Patient noticed over the last several months and bothersome to her. Lesion was excised as noted above. Patient will followup for  biopsy results. 3. Urinary incontinence. Patient having issues with urinary incontinence. Difficult to determine if purely stress versus spontaneous loss historically. Recommend followup with urology for testing and treatment options. Patient agrees and wouldn't help her make this arrangement. Urinalysis checked today. 4. Pap smear 2008. Pap was done today. 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 Pap smears were negative after that. We'll continue to monitor. 5. Mammogram reported 2007. Patient knows she is way overdue and agrees to schedule. SBE monthly reviewed. 6. Colonoscopy reported 2010. She's going to check when she is due for a repeat colonoscopy. 7. DEXA reported 2002. Recommend scheduling baseline DEXA now she agrees to do so. Increase calcium vitamin D reviewed. 8. Health maintenance. No blood work done as this is done through her primary physician's office. Followup as noted above.   Note: This document was prepared with digital dictation and possible smart phrase technology. Any transcriptional errors that result from this process are unintentional.   Anastasio Auerbach MD, 3:38 PM 05/31/2013

## 2013-05-31 NOTE — Patient Instructions (Addendum)
Office will call you with biopsy results.  Schedule your mammography  Check to see when your colonoscopy is due.  Office will help to arrange a urology appointment for you.

## 2013-05-31 NOTE — Telephone Encounter (Signed)
Notes will be faxed once urine results return.

## 2013-06-01 LAB — URINALYSIS W MICROSCOPIC + REFLEX CULTURE
Bacteria, UA: NONE SEEN
Hgb urine dipstick: NEGATIVE
Ketones, ur: NEGATIVE mg/dL
Leukocytes, UA: NEGATIVE
Nitrite: NEGATIVE
Urobilinogen, UA: 0.2 mg/dL (ref 0.0–1.0)
pH: 6 (ref 5.0–8.0)

## 2013-06-03 NOTE — Telephone Encounter (Signed)
Appt. 06/07/13 @ 8:15 am

## 2013-06-03 NOTE — Telephone Encounter (Signed)
Left message for pt to call.

## 2013-06-03 NOTE — Telephone Encounter (Signed)
Notes faxed this am, will wait for response from office

## 2013-06-04 NOTE — Telephone Encounter (Signed)
Pt informed with the time & date by claudia.

## 2013-06-05 NOTE — Telephone Encounter (Signed)
Called and spoke with patient - documented in other telephone encounter. Patient has received help with this.

## 2013-06-05 NOTE — Telephone Encounter (Signed)
Called to check on patient.  She came in to see me but came on the wrong day and I was gone already for the day.  She says that [facility] has been helping her and she is trying to use the machine.  She states she saw the dentist yesterday due to discomfort with her mouth.  She describes it as a raw or inflamed feeling in her gums.  The dentist told her it is due to dryness and is a direct result of the cpap machine.  She is worried about using it.  Her dentist recommended Biotin which she used and it helped considerably.  I advised her to increase her humidification, keep a water bottle by her bedside to swish with as needed during the night if she awakens, to place her tongue on the roof of her mouth to prevent air from coming into her mouth from her throat.  We also discussed the possibility of a chin strap if none of those things help. She also feels like she isn't sleeping on it.  She says she lays there for a long time and has a couple of days where she feels like she's doing okay with it and then it gets harder to fall asleep with again.  I asked her if she uses anything to help her sleep, she replies only in the hospital sometimes.  I mentioned Melatonin as an over the counter short term solution that may be helpful.  I also explained that some people use a soothing bedtime routine such as reading with their cpap on but not trying to sleep in order to desensitize to the way the pressure feels by the time they are ready to sleep.  Also recommended relaxation exercises and deep breathing at bedtime to help her adjust to the cpap.   I emphasized above all that she does need to continue to use machine, it is very important.  I explained that some of these things turn up during the adjustment phase and that we will tackle them together to help her be able to reach success in using her CPAP.  I offered to schedule a follow up appt, she is willing, however she states financially she isn't able to pay the copay of $45 at  this time.  I explained I understood that.  Told her I will forward all my notes to Dr. Rexene Alberts and if Dr. Rexene Alberts wants her to do anything differently than what she is doing, she will let me know so I can pass that info on to patient. -sh

## 2013-06-11 ENCOUNTER — Ambulatory Visit (INDEPENDENT_AMBULATORY_CARE_PROVIDER_SITE_OTHER): Payer: Medicare Other | Admitting: Gynecology

## 2013-06-11 ENCOUNTER — Encounter: Payer: Self-pay | Admitting: Gynecology

## 2013-06-11 DIAGNOSIS — Z23 Encounter for immunization: Secondary | ICD-10-CM

## 2013-06-11 NOTE — Patient Instructions (Signed)
Office will call you to arrange appointment with dermatologist Amy Martinique

## 2013-06-11 NOTE — Progress Notes (Signed)
Patient follows up for reexamination and discussion. Patient had benign appearing fibroepithelial polyp excised from her left lower bulb at the junction with her thigh crease which returned a basal cell carcinoma. I reviewed the diagnosis with the pathologist and subsequently discussed the case with Dr. Amy Martinique, dermatologist who agreed to see the patient in followup.  Exam was Irven Shelling External BUS vagina with atrophic changes. Prior biopsy site completely healed and difficult to identify. No evidence of residual pigmentation or any other abnormality in the area. No evidence of inguinal adenopathy or other atypical appearing areas on her vulva.  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 Biopsy site is completely healed without evidence of residual disease. Reviewed biopsy pathology with the patient and the need for followup with the dermatologist. Possible need to reexcised the original biopsy site although it is difficult to identify as she healed very quickly in less than 2 weeks. My office is going to arrange an appointment with Dr. Amy Martinique and the patient agrees to see her in followup. I again stressed the need for followup examination.

## 2013-06-12 ENCOUNTER — Telehealth: Payer: Self-pay | Admitting: *Deleted

## 2013-06-12 NOTE — Telephone Encounter (Signed)
Appointment on 06/21/13 @ 3:00 pm

## 2013-06-12 NOTE — Telephone Encounter (Signed)
Spoke with appointment desk and was told to fax notes to office and nurse will review and they will contact me back with time and date for pt.

## 2013-06-12 NOTE — Telephone Encounter (Signed)
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 I discussed this case with her and you are to tell her staff that she approved on fitting her in early within the next several weeks. Provided her with a copy of the pathology report. ------

## 2013-06-13 ENCOUNTER — Encounter: Payer: Self-pay | Admitting: Gynecology

## 2013-06-21 ENCOUNTER — Telehealth: Payer: Self-pay | Admitting: *Deleted

## 2013-06-21 NOTE — Telephone Encounter (Signed)
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 Deanna [name] (daughter) said she cancel because the staff at dermatology office told her that this appointment would only be to look at that one area vulvar basal cell carcinoma and her mother has several spots the need to be looked at. I explained to Deanna [name] that this is what Dr.Fontaine said to do and this is how it was done. I also explained to the daughter that dermatology appointment are booked at least 4 months away and very hard to get in and this appointment should have no been cancel. I told her that once pt is established with a doctor there she can continue to follow with her for any dermatology issues. If Deanna [name] office did not look at pt whole body that day then a follow up appointment would have been made. I called Dr.Jordon office and was informed that pt could be seen at the same time and they would look at some areas for pt. I called back to informed Deanna [name] of this information, but she refused to speak with me about this, so I spoke with patient directly who was very nice said that she would go today and be seen by Dr.Jordon. I then explained to patient that once established she can continue care with Dr.Jordon. Pt was fine with this and will keep her schedule appointment.

## 2013-06-24 ENCOUNTER — Other Ambulatory Visit: Payer: Self-pay | Admitting: Cardiovascular Disease

## 2013-07-29 ENCOUNTER — Other Ambulatory Visit: Payer: Self-pay | Admitting: *Deleted

## 2013-07-29 MED ORDER — AMLODIPINE BESYLATE 5 MG PO TABS: 5.0000 mg | ORAL_TABLET | Freq: Every day | ORAL | Status: DC

## 2013-07-29 NOTE — Telephone Encounter (Signed)
Rx was sent to pharmacy electronically. 

## 2014-04-30 NOTE — Sleep Study (Signed)
See media tab

## 2014-05-30 ENCOUNTER — Encounter: Payer: Self-pay | Admitting: Neurology

## 2014-05-30 ENCOUNTER — Ambulatory Visit (INDEPENDENT_AMBULATORY_CARE_PROVIDER_SITE_OTHER): Payer: Commercial Managed Care - HMO | Admitting: Neurology

## 2014-05-30 VITALS — BP 153/68 | HR 72 | Temp 97.2°F | Ht 62.0 in | Wt 206.0 lb

## 2014-05-30 DIAGNOSIS — R413 Other amnesia: Secondary | ICD-10-CM

## 2014-05-30 DIAGNOSIS — G4733 Obstructive sleep apnea (adult) (pediatric): Secondary | ICD-10-CM

## 2014-05-30 NOTE — Patient Instructions (Signed)
We will try autoPAP and a nasal pillows mask.
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
## 2014-06-09 ENCOUNTER — Encounter: Payer: Self-pay | Admitting: Neurology

## 2014-07-02 ENCOUNTER — Encounter: Payer: Self-pay | Admitting: Cardiovascular Disease

## 2014-07-02 ENCOUNTER — Ambulatory Visit (INDEPENDENT_AMBULATORY_CARE_PROVIDER_SITE_OTHER): Payer: Medicare PPO | Admitting: Cardiovascular Disease

## 2014-07-02 VITALS — BP 136/68 | HR 70 | Ht 62.0 in | Wt 206.0 lb

## 2014-07-02 DIAGNOSIS — I10 Essential (primary) hypertension: Secondary | ICD-10-CM

## 2014-07-02 DIAGNOSIS — I739 Peripheral vascular disease, unspecified: Secondary | ICD-10-CM

## 2014-07-02 DIAGNOSIS — I2510 Atherosclerotic heart disease of native coronary artery without angina pectoris: Secondary | ICD-10-CM

## 2014-07-02 DIAGNOSIS — G458 Other transient cerebral ischemic attacks and related syndromes: Secondary | ICD-10-CM

## 2014-07-02 DIAGNOSIS — E785 Hyperlipidemia, unspecified: Secondary | ICD-10-CM

## 2014-07-02 NOTE — Assessment & Plan Note (Signed)
History of CAD status post anterior wall marker of myocardial infarction in 1984 treated with angioplasty by Dr. [name]. She had circumflex into possibly 1995. Dr. Arlyce Dice performed coronary artery bypass grafting in 1999 as well as femorofemoral crossover grafting. I performed cardiac catheterization on her 02/19/01 revealing an atretic LIMA to the LAD, patent vein to a diagonal branch, total branch and distal right coronary artery. Her left to right femorofemoral bypass graft was patent as well. Her last catheterization was performed by Dr. Ellyn Hack 12/19/11 revealing an occluded vein graft to the PDA not amenable to PCI. The remainder of her anatomy is unchanged compared to her prior cath. Medical therapy was recommended. She denies chest pain or shortness of breath.

## 2014-07-02 NOTE — Assessment & Plan Note (Signed)
History of left upper extremity claudication with angiographic documentation of calcified high-grade left subclavian artery stenosis which I performed 11/15/11. The patient ultimately had a left carotid to subclavian bypass performed by Dr. Trula Slade .

## 2014-07-02 NOTE — Progress Notes (Signed)
07/02/2014 [name]   19-Oct-1940  [id number]  Primary Physician Donnajean Lopes, MD Primary Cardiologist: Lorretta Harp MD Renae Gloss   HPI:  Mrs. Deanna [name] is a 73 year old moderately overweight divorced Caucasian female mother of one daughter who accompanies her here today. She was formerly a patient of Dr. Lowella Fairy. I am assuming her care. I have the procedures on her in the past as well. Her primary care physician is Dr. Valetta Fuller. She has a history of coronary artery disease status post anterior wall myocardial infarction in 1994 treated with LAD angioplasty by Dr. [name]. She had circumflex intervention the following year. Ultimately she required coronary bypass grafting in 1999 by Dr. Arlyce Dice . 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 Trula Slade. Peripheral problems include to hypertension and hyperlipidemia. She does not smoke. She is not diabetic. She has a strong family history of heart disease. She denies chest pain, shortness of breath or claudication.   Current Outpatient Prescriptions  Medication Sig Dispense Refill  . acetaminophen (TYLENOL) 325 MG tablet TYLENOL TABS    . ALPRAZolam (XANAX) 1 MG tablet Take 1 mg by mouth 2 (two) times daily as needed.     Marland Kitchen amLODipine (NORVASC) 5 MG tablet Take 1 tablet (5 mg total) by mouth daily. 30 tablet 5  . aspirin EC 81 MG tablet Take 81 mg by mouth daily.    . clopidogrel (PLAVIX) 75 MG tablet Take 1 tablet (75 mg total) by mouth once. 30 tablet 4  . CRESTOR 10 MG tablet TAKE ONE TABLET BY MOUTH DAILY 30 tablet 6  . Cyanocobalamin (VITAMIN B-12 PO) Take 1 tablet by mouth daily.    Marland Kitchen levothyroxine (SYNTHROID, LEVOTHROID) 175 MCG tablet Take 175 mcg by  mouth daily.    Marland Kitchen LORazepam (ATIVAN) 1 MG tablet Take 0.5 tablets (0.5 mg total) by mouth as needed for anxiety. For aniexty (Patient taking differently: Take 0.5 mg by mouth as needed for anxiety. Take 1 tablet by mouth every morning and take 0.5 tablet by mouth every evening. (Pt alternates with Alprazolam)) 30 tablet 0  . meclizine (ANTIVERT) 12.5 MG tablet Take 12.5 mg by mouth as needed. For dizziness    . metoprolol (LOPRESSOR) 50 MG tablet Take 50 mg by mouth 2 (two) times daily.     . Multiple Vitamin (MULTIVITAMIN) tablet Take 1 tablet by mouth daily.    . ranitidine (ZANTAC) 150 MG capsule Take 150 mg by mouth daily.    . sertraline (ZOLOFT) 100 MG tablet Take 50-100 mg by mouth 2 (two) times daily. Take 1 tablet in the morning and 1/2 a tablet in the evening    . traMADol (ULTRAM) 50 MG tablet Take 50 mg by mouth as needed.     . valsartan (DIOVAN) 80 MG tablet Take 80 mg by mouth every morning.      No current facility-administered medications for this visit.    Allergies  Allergen Reactions  . Vicodin [Hydrocodone-Acetaminophen] Other (See Comments)    unknown    History   Social History  . Marital Status: Divorced    Spouse Name: N/A    Number of Children: N/A  . Years of Education: N/A   Occupational History  . Not on file.   Social History Main Topics  . Smoking  status: Former Smoker    Types: Cigarettes    Quit date: 11/27/1981  . Smokeless tobacco: Never Used  . Alcohol Use: No  . Drug Use: No  . Sexual Activity: No   Other Topics Concern  . Not on file   Social History Narrative     Review of Systems: General: negative for chills, fever, night sweats or weight changes.  Cardiovascular: negative for chest pain, dyspnea on exertion, edema, orthopnea, palpitations, paroxysmal nocturnal dyspnea or shortness of breath Dermatological: negative for rash Respiratory: negative for cough or wheezing Urologic: negative for hematuria Abdominal: negative for  nausea, vomiting, diarrhea, bright red blood per rectum, melena, or hematemesis Neurologic: negative for visual changes, syncope, or dizziness All other systems reviewed and are otherwise negative except as noted above.    Blood pressure 136/68, pulse 70, height 5\' 2"  (1.575 m), weight 206 lb (93.441 kg).  General appearance: alert and no distress Neck: no adenopathy, no JVD, supple, symmetrical, trachea midline, thyroid not enlarged, symmetric, no tenderness/mass/nodules and soft bilateral carotid bruits Lungs: clear to auscultation bilaterally Heart: regular rate and rhythm, S1, S2 normal, no murmur, click, rub or gallop Extremities: extremities normal, atraumatic, no cyanosis or edema and 1+ pedal pulses bilaterally  EKG normal sinus rhythm at 70 without ST or T-wave changes. I personally reviewed this EKG  ASSESSMENT AND PLAN:   Essential hypertension History of hypertension with blood pressure measured today at 136/68. She is on amlodipine 5 mg, metoprolol 50 mg and valsartan 80 mg. We will keep her on her same meds at same dose  Coronary atherosclerosis History of CAD status post anterior wall marker of myocardial infarction in 1984 treated with angioplasty by Dr. [name]. She had circumflex into possibly 1995. Dr. Arlyce Dice performed coronary artery bypass grafting in 1999 as well as femorofemoral crossover grafting. I performed cardiac catheterization on her 02/19/01 revealing an atretic LIMA to the LAD, patent vein to a diagonal branch, total branch and distal right coronary artery. Her left to right femorofemoral bypass graft was patent as well. Her last catheterization was performed by Dr. Ellyn Hack 12/19/11 revealing an occluded vein graft to the PDA not amenable to PCI. The remainder of her anatomy is unchanged compared to her prior cath. Medical therapy was recommended. She denies chest pain or shortness of breath.  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 The patient ultimately had a left carotid to subclavian bypass performed by Dr. Trula Slade .      Lorretta Harp MD [facility], [facility] 07/02/2014 2:05 PM

## 2014-07-02 NOTE — Assessment & Plan Note (Signed)
History of hyperlipidemia on Crestor 10 mg daily followed by her PCP

## 2014-07-02 NOTE — Patient Instructions (Signed)
Dr Gwenlyn Found has ordered the following test(s) to be done: 1. Carotid Doppler - This test is an ultrasound of the carotid arteries in your neck. It looks at blood flow through these arteries that supply the brain with blood. Allow one hour for this exam. There are no restrictions or special instructions.  2. Lower Extremity Arterial Doppler - This test is an ultrasound of the arteries in the legs. It looks at arterial blood flow in the legs. Allow one hour for Lower Arterial scans. There are no restrictions or special instructions.  Dr Gwenlyn Found wants you to follow-up in 1 year. You will receive a reminder letter in the mail one months in advance. If you don't receive a letter, please call our office to schedule the follow-up appointment.

## 2014-07-02 NOTE — Assessment & Plan Note (Signed)
History of hypertension with blood pressure measured today at 136/68. She is on amlodipine 5 mg, metoprolol 50 mg and valsartan 80 mg. We will keep her on her same meds at same dose

## 2014-07-09 ENCOUNTER — Ambulatory Visit (HOSPITAL_BASED_OUTPATIENT_CLINIC_OR_DEPARTMENT_OTHER)
Admission: RE | Admit: 2014-07-09 | Discharge: 2014-07-09 | Disposition: A | Discharge status: Home / Self Care | Payer: Commercial Managed Care - HMO | Source: Ambulatory Visit | Attending: Cardiovascular Disease | Admitting: Cardiovascular Disease

## 2014-07-09 ENCOUNTER — Ambulatory Visit (HOSPITAL_COMMUNITY)
Admission: RE | Admit: 2014-07-09 | Discharge: 2014-07-09 | Disposition: A | Discharge status: Home / Self Care | Payer: Commercial Managed Care - HMO | Source: Ambulatory Visit | Attending: Cardiovascular Disease | Admitting: Cardiovascular Disease

## 2014-07-09 DIAGNOSIS — I739 Peripheral vascular disease, unspecified: Secondary | ICD-10-CM

## 2014-07-09 DIAGNOSIS — Z95828 Presence of other vascular implants and grafts: Secondary | ICD-10-CM | POA: Diagnosis not present

## 2014-07-09 DIAGNOSIS — I6523 Occlusion and stenosis of bilateral carotid arteries: Secondary | ICD-10-CM | POA: Diagnosis not present

## 2014-07-09 DIAGNOSIS — I672 Cerebral atherosclerosis: Secondary | ICD-10-CM | POA: Diagnosis not present

## 2014-07-09 DIAGNOSIS — I779 Disorder of arteries and arterioles, unspecified: Secondary | ICD-10-CM

## 2014-07-09 NOTE — Progress Notes (Signed)
Arterial Lower Ext. Duplex Completed. Vinnie Gombert, BS, RDMS, RVT  

## 2014-07-09 NOTE — Progress Notes (Signed)
Carotid Duplex Completed. Cheney Ewart, BS, RDMS, RVT  

## 2014-07-14 ENCOUNTER — Encounter: Payer: Self-pay | Admitting: *Deleted

## 2014-07-15 ENCOUNTER — Encounter: Payer: Self-pay | Admitting: *Deleted

## 2014-07-17 ENCOUNTER — Encounter (HOSPITAL_COMMUNITY): Payer: Self-pay | Admitting: Cardiovascular Disease

## 2014-07-17 ENCOUNTER — Encounter: Payer: Medicare Other | Admitting: Gynecology

## 2014-08-06 ENCOUNTER — Telehealth: Payer: Self-pay | Admitting: Neurology

## 2014-08-06 NOTE — Telephone Encounter (Signed)
Called and confirmed appointment time change with patient for 09/02/14

## 2014-09-02 ENCOUNTER — Ambulatory Visit (INDEPENDENT_AMBULATORY_CARE_PROVIDER_SITE_OTHER): Payer: Commercial Managed Care - HMO | Admitting: Neurology

## 2014-09-02 ENCOUNTER — Telehealth: Payer: Self-pay | Admitting: Neurology

## 2014-09-02 ENCOUNTER — Encounter: Payer: Self-pay | Admitting: Neurology

## 2014-09-02 VITALS — BP 152/70 | HR 79 | Temp 98.5°F | Ht 62.0 in | Wt 212.0 lb

## 2014-09-02 DIAGNOSIS — R413 Other amnesia: Secondary | ICD-10-CM

## 2014-09-02 DIAGNOSIS — G4733 Obstructive sleep apnea (adult) (pediatric): Secondary | ICD-10-CM

## 2014-09-02 DIAGNOSIS — E669 Obesity, unspecified: Secondary | ICD-10-CM | POA: Diagnosis not present

## 2014-09-02 DIAGNOSIS — Z9989 Dependence on other enabling machines and devices: Principal | ICD-10-CM

## 2014-09-02 NOTE — Telephone Encounter (Addendum)
Spoke with patient and  she is still using her machine but  I was unable to get download from [place]. Asked patient to make sure she brings machine for visit today and she said that she would. Called [facility] to get a download, said that patient was non compliance due to memory issues(forgetting to turn on machine). Stated that patient was asked to mail in [place] card to get download since she does not have a modem on machine, but patient would not return phone calls and was later asked to return machine for non use but she has not per Lattie Haw. Asked Lattie Haw to fax over that report for Dr Rexene Alberts to review.[facility] have closed out patient's accessment and a new sleep study will probably be needed.

## 2014-09-02 NOTE — Patient Instructions (Signed)
Please continue using your CPAP regularly. While your insurance requires that you use CPAP at least 4 hours each night on 70% of the nights, I recommend, that you not skip any nights and use it throughout the night if you can. Getting used to CPAP and staying with the treatment long term does take time and patience and discipline. Untreated obstructive sleep apnea when it is moderate to severe can have an adverse impact on cardiovascular health and raise her risk for heart disease, arrhythmias, hypertension, congestive heart failure, stroke and diabetes. Untreated obstructive sleep apnea causes sleep disruption, nonrestorative sleep, and sleep deprivation. This can have an impact on your day to day functioning and cause daytime sleepiness and impairment of cognitive function, memory loss, mood disturbance, and problems focussing. Using CPAP regularly can improve these symptoms.  You may need to see a urologist, talk to Dr. Philip Aspen about seeing a urologist again.
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
## 2014-09-09 ENCOUNTER — Encounter: Payer: Commercial Managed Care - HMO | Admitting: Gynecology

## 2014-10-24 ENCOUNTER — Encounter: Payer: Commercial Managed Care - HMO | Admitting: Gynecology

## 2014-11-17 DIAGNOSIS — R1011 Right upper quadrant pain: Secondary | ICD-10-CM | POA: Diagnosis not present

## 2014-11-17 DIAGNOSIS — I10 Essential (primary) hypertension: Secondary | ICD-10-CM | POA: Diagnosis not present

## 2014-11-17 DIAGNOSIS — M47814 Spondylosis without myelopathy or radiculopathy, thoracic region: Secondary | ICD-10-CM | POA: Diagnosis not present

## 2014-11-17 DIAGNOSIS — M47819 Spondylosis without myelopathy or radiculopathy, site unspecified: Secondary | ICD-10-CM | POA: Diagnosis not present

## 2014-11-17 DIAGNOSIS — Z6839 Body mass index (BMI) 39.0-39.9, adult: Secondary | ICD-10-CM | POA: Diagnosis not present

## 2014-11-17 DIAGNOSIS — M47816 Spondylosis without myelopathy or radiculopathy, lumbar region: Secondary | ICD-10-CM | POA: Diagnosis not present

## 2014-11-17 DIAGNOSIS — M4316 Spondylolisthesis, lumbar region: Secondary | ICD-10-CM | POA: Diagnosis not present

## 2014-11-17 DIAGNOSIS — I2510 Atherosclerotic heart disease of native coronary artery without angina pectoris: Secondary | ICD-10-CM | POA: Diagnosis not present

## 2014-11-17 DIAGNOSIS — G4733 Obstructive sleep apnea (adult) (pediatric): Secondary | ICD-10-CM | POA: Diagnosis not present

## 2014-11-17 DIAGNOSIS — M5136 Other intervertebral disc degeneration, lumbar region: Secondary | ICD-10-CM | POA: Diagnosis not present

## 2014-11-18 DIAGNOSIS — R32 Unspecified urinary incontinence: Secondary | ICD-10-CM | POA: Diagnosis not present

## 2014-11-18 DIAGNOSIS — E039 Hypothyroidism, unspecified: Secondary | ICD-10-CM | POA: Diagnosis not present

## 2014-11-19 ENCOUNTER — Telehealth: Payer: Self-pay | Admitting: Neurology

## 2014-11-19 NOTE — Telephone Encounter (Signed)
Pls see below and call pt back:  I saw her in Jan and she was on CPAP and I did not think she needed a sleep study. She has been on CPAP therapy and should not need another sleep study and FU in clinic in July was the plan at the time. Pls advise pt.
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
## 2014-11-19 NOTE — Telephone Encounter (Signed)
I spoke with patient and her daughter. [facility] is now charging patient $90 a month for CPAP and according to daughter they suggested that she needs another Sleep Study. I will contact Betsy with [facility] and see what we need to do for this patient.

## 2014-11-20 DIAGNOSIS — R1011 Right upper quadrant pain: Secondary | ICD-10-CM | POA: Diagnosis not present
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
## 2014-11-20 NOTE — Telephone Encounter (Signed)
I spoke with patient today. She asked if I can call back and speak with daughter (she was out running errands). I advised that I will call back and speak with her daughter.

## 2014-11-21 NOTE — Telephone Encounter (Signed)
Left message that office is closed at 12pm today. And they can call back starting Monday at 8am.

## 2014-11-27 NOTE — Telephone Encounter (Signed)
I spoke to daughter and patient today. They are aware of below information. I am trying to get a recent CPAP down load, the most recent on line is Dec to Jan. I contacted [facility] for more recent (daughter dropped of SD card at [facility] this past Friday). [facility] will call us back about getting that down load.
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
## 2014-12-05 NOTE — Telephone Encounter (Signed)
I sent a message to [facility] with below question, waiting for response.

## 2014-12-15 NOTE — Telephone Encounter (Signed)
Sent message again to [facility] with below information.

## 2014-12-16 ENCOUNTER — Encounter: Payer: Self-pay | Admitting: Gynecology

## 2014-12-16 ENCOUNTER — Ambulatory Visit (INDEPENDENT_AMBULATORY_CARE_PROVIDER_SITE_OTHER): Payer: Commercial Managed Care - HMO | Admitting: Gynecology

## 2014-12-16 VITALS — BP 124/80 | Ht 60.0 in | Wt 210.0 lb

## 2014-12-16 DIAGNOSIS — N952 Postmenopausal atrophic vaginitis: Secondary | ICD-10-CM | POA: Diagnosis not present

## 2014-12-16 DIAGNOSIS — Z01419 Encounter for gynecological examination (general) (routine) without abnormal findings: Secondary | ICD-10-CM

## 2014-12-16 DIAGNOSIS — N9089 Other specified noninflammatory disorders of vulva and perineum: Secondary | ICD-10-CM

## 2014-12-16 DIAGNOSIS — R8299 Other abnormal findings in urine: Secondary | ICD-10-CM | POA: Diagnosis not present

## 2014-12-16 DIAGNOSIS — C519 Malignant neoplasm of vulva, unspecified: Secondary | ICD-10-CM | POA: Diagnosis not present

## 2014-12-16 NOTE — Progress Notes (Signed)
Deanna [name] 12/25/1940 [id number]        74 y.o.  G1P1 for breast and pelvic exam. Several issues noted below.  Past medical history,surgical history, problem list, medications, allergies, family history and social history were all reviewed and documented as reviewed in the EPIC chart.  ROS:  Performed with pertinent positives and negatives included in the history, assessment and plan.   Additional significant findings :  none   Exam: Kim Counsellor Vitals:   12/16/14 1429  BP: 124/80  Height: 5' (1.524 m)  Weight: 210 lb (95.255 kg)   General appearance:  Normal affect, orientation and appearance. Skin: Grossly normal HEENT: Without gross lesions.  No cervical or supraclavicular adenopathy. Thyroid normal.  Lungs:  Clear without wheezing, rales or rhonchi Cardiac: RR, without RMG Abdominal:  Soft, nontender, without masses, guarding, rebound, organomegaly or hernia Breasts:  Examined lying and sitting without masses, retractions, discharge or axillary adenopathy. Pelvic:  Ext/BUS/vagina with generalized atrophic changes. Small fibroepithelial polyp appearing lesion lower left labia majora at crease with thigh Physical Exam  Genitourinary:        Cervix atrophic  Uterus grossly normal but difficult to palpate.  Adnexa  Without gross masses or tenderness    Anus and perineum  Normal   Rectovaginal  Normal sphincter tone without palpated masses or tenderness.    Assessment/Plan:  74 y.o. G1P1 female for breast and pelvic exam  1. 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 Polyp excised in its entirety to the level of the surrounding normal-appearing skin as described above. Patient will follow up for results. 2. Pap smear 2014. No Pap smear done today.  History of LGSIL with LEEP 2002. Pap smear is negative after that. 3. Urinary incontinence. Continues to have spontaneous loss of urine. Was evaluated by urology. Recommended she  call and make a follow up appointment with them to see what they can do to help. 4. Mammography 06/2014. Continue with annual mammography. SBE monthly reviewed. 5. DEXA number of years ago. Unclear exactly when. Last record here 2002.  Recommend patient schedule follow up DEXA now and patient agrees to do so. Increased calcium and vitamin D reviewed. 6. Colonoscopy 2010. Repeat at their recommended interval. 7. Health maintenance. No routine blood work done as patient has this done at her primary physician's office. Follow up biopsy results otherwise in one year, sooner as needed.     Anastasio Auerbach MD, 3:17 PM 12/16/2014

## 2014-12-16 NOTE — Patient Instructions (Addendum)
Office will call you with biopsy results.  Follow up for bone density as scheduled.  You may obtain a copy of any labs that were done today by logging onto MyChart as outlined in the instructions provided with your AVS (after visit summary). The office will not call with normal lab results but certainly if there are any significant abnormalities then we will contact you.   Health Maintenance, Female A healthy lifestyle and preventative care can promote health and wellness.  Maintain regular health, dental, and eye exams.  Eat a healthy diet. Foods like vegetables, fruits, whole grains, low-fat dairy products, and lean protein foods contain the nutrients you need without too many calories. Decrease your intake of foods high in solid fats, added sugars, and salt. Get information about a proper diet from your caregiver, if necessary.  Regular physical exercise is one of the most important things you can do for your health. Most adults should get at least 150 minutes of moderate-intensity exercise (any activity that increases your heart rate and causes you to sweat) each week. In addition, most adults need muscle-strengthening exercises on 2 or more days a week.   Maintain a healthy weight. The body mass index (BMI) is a screening tool to identify possible weight problems. It provides an estimate of body fat based on height and weight. Your caregiver can help determine your BMI, and can help you achieve or maintain a healthy weight. For adults 20 years and older:  A BMI below 18.5 is considered underweight.  A BMI of 18.5 to 24.9 is normal.  A BMI of 25 to 29.9 is considered overweight.  A BMI of 30 and above is considered obese.  Maintain normal blood lipids and cholesterol by exercising and minimizing your intake of saturated fat. Eat a balanced diet with plenty of fruits and vegetables. Blood tests for lipids and cholesterol should begin at age 44 and be repeated every 5 years. If your lipid  or cholesterol levels are high, you are over 50, or you are a high risk for heart disease, you may need your cholesterol levels checked more frequently.Ongoing high lipid and cholesterol levels should be treated with medicines if diet and exercise are not effective.  If you smoke, find out from your caregiver how to quit. If you do not use tobacco, do not start.  Lung cancer screening is recommended for adults aged 61 80 years who are at high risk for developing lung cancer because of a history of smoking. Yearly low-dose computed tomography (CT) is recommended for people who have at least a 30-pack-year history of smoking and are a current smoker or have quit within the past 15 years. A pack year of smoking is smoking an average of 1 pack of cigarettes a day for 1 year (for example: 1 pack a day for 30 years or 2 packs a day for 15 years). Yearly screening should continue until the smoker has stopped smoking for at least 15 years. Yearly screening should also be stopped for people who develop a health problem that would prevent them from having lung cancer treatment.  If you are pregnant, do not drink alcohol. If you are breastfeeding, be very cautious about drinking alcohol. If you are not pregnant and choose to drink alcohol, do not exceed 1 drink per day. One drink is considered to be 12 ounces (355 mL) of beer, 5 ounces (148 mL) of wine, or 1.5 ounces (44 mL) of liquor.  Avoid use of street drugs. Do  not share needles with anyone. Ask for help if you need support or instructions about stopping the use of drugs.  High blood pressure causes heart disease and increases the risk of stroke. Blood pressure should be checked at least every 1 to 2 years. Ongoing high blood pressure should be treated with medicines, if weight loss and exercise are not effective.  If you are 72 to 73 years old, ask your caregiver if you should take aspirin to prevent strokes.  Diabetes screening involves taking a blood  sample to check your fasting blood sugar level. This should be done once every 3 years, after age 36, if you are within normal weight and without risk factors for diabetes. Testing should be considered at a younger age or be carried out more frequently if you are overweight and have at least 1 risk factor for diabetes.  Breast cancer screening is essential preventative care for women. You should practice "breast self-awareness." This means understanding the normal appearance and feel of your breasts and may include breast self-examination. Any changes detected, no matter how small, should be reported to a caregiver. Women in their 29s and 30s should have a clinical breast exam (CBE) by a caregiver as part of a regular health exam every 1 to 3 years. After age 31, women should have a CBE every year. Starting at age 51, women should consider having a mammogram (breast X-ray) every year. Women who have a family history of breast cancer should talk to their caregiver about genetic screening. Women at a high risk of breast cancer should talk to their caregiver about having an MRI and a mammogram every year.  Breast cancer gene (BRCA)-related cancer risk assessment is recommended for women who have family members with BRCA-related cancers. BRCA-related cancers include breast, ovarian, tubal, and peritoneal cancers. Having family members with these cancers may be associated with an increased risk for harmful changes (mutations) in the breast cancer genes BRCA1 and BRCA2. Results of the assessment will determine the need for genetic counseling and BRCA1 and BRCA2 testing.  The Pap test is a screening test for cervical cancer. Women should have a Pap test starting at age 34. Between ages 60 and 49, Pap tests should be repeated every 2 years. Beginning at age 44, you should have a Pap test every 3 years as long as the past 3 Pap tests have been normal. If you had a hysterectomy for a problem that was not cancer or a  condition that could lead to cancer, then you no longer need Pap tests. If you are between ages 40 and 67, and you have had normal Pap tests going back 10 years, you no longer need Pap tests. If you have had past treatment for cervical cancer or a condition that could lead to cancer, you need Pap tests and screening for cancer for at least 20 years after your treatment. If Pap tests have been discontinued, risk factors (such as a new sexual partner) need to be reassessed to determine if screening should be resumed. Some women have medical problems that increase the chance of getting cervical cancer. In these cases, your caregiver may recommend more frequent screening and Pap tests.  The human papillomavirus (HPV) test is an additional test that may be used for cervical cancer screening. The HPV test looks for the virus that can cause the cell changes on the cervix. The cells collected during the Pap test can be tested for HPV. The HPV test could be used to  screen women aged 46 years and older, and should be used in women of any age who have unclear Pap test results. After the age of 63, women should have HPV testing at the same frequency as a Pap test.  Colorectal cancer can be detected and often prevented. Most routine colorectal cancer screening begins at the age of 4 and continues through age 40. However, your caregiver may recommend screening at an earlier age if you have risk factors for colon cancer. On a yearly basis, your caregiver may provide home test kits to check for hidden blood in the stool. Use of a small camera at the end of a tube, to directly examine the colon (sigmoidoscopy or colonoscopy), can detect the earliest forms of colorectal cancer. Talk to your caregiver about this at age 48, when routine screening begins. Direct examination of the colon should be repeated every 5 to 10 years through age 102, unless early forms of pre-cancerous polyps or small growths are found.  Hepatitis C blood  testing is recommended for all people born from [birth year] through 1964 and any individual with known risks for hepatitis C.  Practice safe sex. Use condoms and avoid high-risk sexual practices to reduce the spread of sexually transmitted infections (STIs). Sexually active women aged 59 and younger should be checked for Chlamydia, which is a common sexually transmitted infection. Older women with new or multiple partners should also be tested for Chlamydia. Testing for other STIs is recommended if you are sexually active and at increased risk.  Osteoporosis is a disease in which the bones lose minerals and strength with aging. This can result in serious bone fractures. The risk of osteoporosis can be identified using a bone density scan. Women ages 11 and over and women at risk for fractures or osteoporosis should discuss screening with their caregivers. Ask your caregiver whether you should be taking a calcium supplement or vitamin D to reduce the rate of osteoporosis.  Menopause can be associated with physical symptoms and risks. Hormone replacement therapy is available to decrease symptoms and risks. You should talk to your caregiver about whether hormone replacement therapy is right for you.  Use sunscreen. Apply sunscreen liberally and repeatedly throughout the day. You should seek shade when your shadow is shorter than you. Protect yourself by wearing long sleeves, pants, a wide-brimmed hat, and sunglasses year round, whenever you are outdoors.  Notify your caregiver of new moles or changes in moles, especially if there is a change in shape or color. Also notify your caregiver if a mole is larger than the size of a pencil eraser.  Stay current with your immunizations. Document Released: 02/07/2011 Document Revised: 11/19/2012 Document Reviewed: 02/07/2011 [facility] Patient Information 2014 [place].

## 2014-12-17 LAB — URINALYSIS W MICROSCOPIC + REFLEX CULTURE
Bilirubin Urine: NEGATIVE
Casts: NONE SEEN
Crystals: NONE SEEN
Glucose, UA: NEGATIVE mg/dL
Hgb urine dipstick: NEGATIVE
Ketones, ur: NEGATIVE mg/dL
LEUKOCYTES UA: NEGATIVE
NITRITE: NEGATIVE
PROTEIN: NEGATIVE mg/dL
SQUAMOUS EPITHELIAL / LPF: NONE SEEN
Specific Gravity, Urine: 1.008 (ref 1.005–1.030)
UROBILINOGEN UA: 0.2 mg/dL (ref 0.0–1.0)
pH: 6 (ref 5.0–8.0)

## 2014-12-19 LAB — URINE CULTURE

## 2014-12-24 ENCOUNTER — Other Ambulatory Visit: Payer: Self-pay | Admitting: Gynecology

## 2014-12-24 MED ORDER — CIPROFLOXACIN HCL 250 MG PO TABS: 250.0000 mg | ORAL_TABLET | Freq: Two times a day (BID) | ORAL | Status: DC

## 2014-12-25 ENCOUNTER — Telehealth: Payer: Self-pay | Admitting: *Deleted

## 2014-12-25 NOTE — Telephone Encounter (Signed)
Tell patient biopsy showed another basal cell carcinoma like before. Needs follow up appt with Dr Amy Martinique as before. Help arrange follow up appt with Dr Martinique

## 2014-12-25 NOTE — Telephone Encounter (Signed)
Appointment on 01/26/15 @ 2:10pm with Dr.Jordon pt aware.

## 2014-12-29 NOTE — Telephone Encounter (Signed)
Deanna [name]  Laurence Spates, RN            She will not need another sleep study if we can drill down a recent 30 day download that shows that she was 70% or more compliant. Sounds like she was compliant for some of the period of time referenced below but we need the 30 day section showing 70% or more.

## 2014-12-29 NOTE — Telephone Encounter (Signed)
I spoke with patient and she is aware of information below. She has an appointment in July and she felt no need to come in sooner. Patient is aware to call us back if any further problems.

## 2015-01-07 ENCOUNTER — Telehealth: Payer: Self-pay | Admitting: Gynecology

## 2015-01-07 NOTE — Telephone Encounter (Signed)
Patient has appointment on 01/26/15

## 2015-01-07 NOTE — Telephone Encounter (Signed)
I just wanted to follow up to make sure she has an appointment/already saw Dr. Martinique the dermatologist in follow up of the cancer biopsy from her vulva that I did.

## 2015-01-13 DIAGNOSIS — M5441 Lumbago with sciatica, right side: Secondary | ICD-10-CM | POA: Diagnosis not present

## 2015-01-20 DIAGNOSIS — M545 Low back pain: Secondary | ICD-10-CM | POA: Diagnosis not present

## 2015-01-26 DIAGNOSIS — M5441 Lumbago with sciatica, right side: Secondary | ICD-10-CM | POA: Diagnosis not present

## 2015-02-03 ENCOUNTER — Telehealth: Payer: Self-pay

## 2015-02-03 DIAGNOSIS — R1909 Other intra-abdominal and pelvic swelling, mass and lump: Secondary | ICD-10-CM

## 2015-02-03 DIAGNOSIS — Z95828 Presence of other vascular implants and grafts: Secondary | ICD-10-CM

## 2015-02-03 DIAGNOSIS — R1031 Right lower quadrant pain: Secondary | ICD-10-CM

## 2015-02-03 NOTE — Telephone Encounter (Signed)
Phone call from pt and her daughter.  Reported pt. fell approx. 6 wks. ago.  Reported she has "severe pain in the right hip and groin area.  Stated she noticed approx. 3 wks. ago the right groin started to swell.  Has been evaluated by Dr. Gladstone Lighter @ [facility].  The daughter stated an MRI was done, and the pt. was told she has "degenerative disc disease, bone spurs, and arthritis."  Reported the pt. has been in "agony for several weeks, and can hardly walk."  Stated there is "some redness in the right groin area."  The pt. stated her pain starts in the low back and travels down the right leg.  Denied any numbness/ tingling/ loss of sensation, open sores, any temperature change of LE's, or color change of LE's.  Discussed with Dr. Kellie Simmering.  Recommended to schedule the pt. for an ultrasound of her fem-fem BP to rule-out pseudoaneurysm, and ABI's.  Will contact pt. To arrange an appt.

## 2015-02-04 ENCOUNTER — Ambulatory Visit: Payer: Medicare PPO | Admitting: Vascular Surgery

## 2015-02-04 ENCOUNTER — Encounter (HOSPITAL_COMMUNITY): Payer: Medicare PPO

## 2015-02-04 NOTE — Telephone Encounter (Signed)
Patient aware, may not be able to come, will let us know. dpm

## 2015-02-05 ENCOUNTER — Ambulatory Visit (HOSPITAL_COMMUNITY)
Admission: RE | Admit: 2015-02-05 | Discharge: 2015-02-05 | Disposition: A | Discharge status: Home / Self Care | Payer: Commercial Managed Care - HMO | Source: Ambulatory Visit | Attending: Vascular Surgery | Admitting: Vascular Surgery

## 2015-02-05 ENCOUNTER — Ambulatory Visit (INDEPENDENT_AMBULATORY_CARE_PROVIDER_SITE_OTHER)
Admission: RE | Admit: 2015-02-05 | Discharge: 2015-02-05 | Disposition: A | Discharge status: Home / Self Care | Payer: Commercial Managed Care - HMO | Source: Ambulatory Visit | Attending: Vascular Surgery | Admitting: Vascular Surgery

## 2015-02-05 DIAGNOSIS — R1031 Right lower quadrant pain: Secondary | ICD-10-CM

## 2015-02-05 DIAGNOSIS — R1900 Intra-abdominal and pelvic swelling, mass and lump, unspecified site: Secondary | ICD-10-CM | POA: Diagnosis not present

## 2015-02-05 DIAGNOSIS — R1909 Other intra-abdominal and pelvic swelling, mass and lump: Secondary | ICD-10-CM

## 2015-02-05 DIAGNOSIS — Z9889 Other specified postprocedural states: Secondary | ICD-10-CM | POA: Diagnosis not present

## 2015-02-05 DIAGNOSIS — Z95828 Presence of other vascular implants and grafts: Secondary | ICD-10-CM

## 2015-02-17 ENCOUNTER — Encounter: Payer: Self-pay | Admitting: *Deleted

## 2015-02-17 DIAGNOSIS — M5441 Lumbago with sciatica, right side: Secondary | ICD-10-CM | POA: Diagnosis not present

## 2015-02-18 ENCOUNTER — Encounter: Payer: Self-pay | Admitting: Vascular Surgery

## 2015-02-20 ENCOUNTER — Ambulatory Visit: Payer: Medicare PPO | Admitting: Vascular Surgery

## 2015-02-23 ENCOUNTER — Encounter: Payer: Self-pay | Admitting: Vascular Surgery

## 2015-02-23 ENCOUNTER — Other Ambulatory Visit: Payer: Self-pay | Admitting: Orthopedic Surgery

## 2015-02-23 DIAGNOSIS — M5441 Lumbago with sciatica, right side: Secondary | ICD-10-CM

## 2015-02-25 ENCOUNTER — Encounter: Payer: Self-pay | Admitting: Vascular Surgery

## 2015-02-25 ENCOUNTER — Ambulatory Visit (INDEPENDENT_AMBULATORY_CARE_PROVIDER_SITE_OTHER): Payer: Commercial Managed Care - HMO | Admitting: Vascular Surgery

## 2015-02-25 VITALS — BP 134/69 | HR 72 | Temp 97.8°F | Resp 20 | Ht 62.0 in | Wt 184.0 lb

## 2015-02-25 DIAGNOSIS — I998 Other disorder of circulatory system: Secondary | ICD-10-CM

## 2015-02-25 DIAGNOSIS — M79604 Pain in right leg: Secondary | ICD-10-CM | POA: Diagnosis not present

## 2015-02-25 DIAGNOSIS — I70229 Atherosclerosis of native arteries of extremities with rest pain, unspecified extremity: Secondary | ICD-10-CM | POA: Insufficient documentation

## 2015-02-25 NOTE — Progress Notes (Signed)
Established Critical Limb Ischemia Patient  History of Present Illness  Deanna [name] is a 74 y.o. (Mar 02, 1941) female s/p fem-fem BPG by Dr. Arlyce Dice and L SCA to CCA bypass with Dr. Trula Slade who presents with chief complaint: back and right groin pain.  Reportedly R to L fem-fem BPG was done in 1999 for a clotted iliac stent that resulted in acute limb ischemia.  The patient has been lost to follow up for this bypass.  Recently, this patient had a fall which resulted in lower back pain with radiating pain down R leg and "knot" in right groin.  Pt's back pain is severe enough to limit her ambulation.  She in the process of evaluation of her back pain with a myelogram planned for this coming week.  The patient's PMH, PSH, SH, and FamHx are unchanged from 09/12/12.  Current Outpatient Prescriptions  Medication Sig Dispense Refill  . acetaminophen (TYLENOL) 325 MG tablet TYLENOL TABS    . amLODipine (NORVASC) 5 MG tablet Take 1 tablet (5 mg total) by mouth daily. 30 tablet 5  . aspirin EC 81 MG tablet Take 81 mg by mouth daily.    . clopidogrel (PLAVIX) 75 MG tablet Take 1 tablet (75 mg total) by mouth once. 30 tablet 4  . CRESTOR 10 MG tablet TAKE ONE TABLET BY MOUTH DAILY 30 tablet 6  . Cyanocobalamin (VITAMIN B-12 PO) Take 1 tablet by mouth daily.    Marland Kitchen levothyroxine (SYNTHROID, LEVOTHROID) 175 MCG tablet Take 200 mcg by mouth daily.     Marland Kitchen LORazepam (ATIVAN) 1 MG tablet Take 0.5 tablets (0.5 mg total) by mouth as needed for anxiety. For aniexty (Patient taking differently: Take 0.5 mg by mouth as needed for anxiety. Take 1 tablet by mouth every morning and take 0.5 tablet by mouth every evening. (Pt alternates with Alprazolam)) 30 tablet 0  . meclizine (ANTIVERT) 12.5 MG tablet Take 12.5 mg by mouth as needed. For dizziness    . metoprolol (LOPRESSOR) 50 MG tablet Take 50 mg by mouth 2 (two) times daily.     . Multiple Vitamin (MULTIVITAMIN) tablet Take 1 tablet by mouth daily.    . ranitidine  (ZANTAC) 150 MG capsule Take 150 mg by mouth daily.    . sertraline (ZOLOFT) 100 MG tablet Take 50-100 mg by mouth 2 (two) times daily. Take 1 tablet in the morning and 1/2 a tablet in the evening    . traMADol (ULTRAM) 50 MG tablet     . valsartan (DIOVAN) 80 MG tablet Take 80 mg by mouth every morning.     . ciprofloxacin (CIPRO) 250 MG tablet     . levothyroxine (SYNTHROID, LEVOTHROID) 200 MCG tablet     . predniSONE (DELTASONE) 5 MG tablet      No current facility-administered medications for this visit.    Allergies  Allergen Reactions  . Vicodin [Hydrocodone-Acetaminophen] Other (See Comments)    unknown    On ROS today: no rest pain, neuropathic pain in R leg, no intermittent claudication    Physical Examination  Filed Vitals:   02/25/15 1527  BP: 134/69  Pulse: 72  Temp: 97.8 F (36.6 C)  TempSrc: Oral  Resp: 20  Height: 5\' 2"  (1.575 m)  Weight: 184 lb (83.462 kg)  SpO2: 98%   Body mass index is 33.65 kg/(m^2).  General: A&O x 3, WDWN, mildly obese  Eyes: PERRLA, EOMI  Pulmonary: Sym exp, good air movt, CTAB, no rales, rhonchi, & wheezing  Cardiac: RRR, Nl S1, S2, no Murmurs, rubs or gallops  Vascular: Vessel Right Left  Radial Palpable Not Palpable  Brachial Palpable Palpable  Carotid Palpable, without bruit Palpable, without bruit  Aorta Not palpable N/A  Femoral Palpable Palpable  Popliteal Not palpable Not palpable  PT Palpable Palpable  DP Palpable Palpable   Gastrointestinal: soft, NTND, no G/R, no HSM, no masses, no CVAT B  Musculoskeletal: MAE spont., BUE 5/5 M/S, did not test BLE due to pain, Extremities without ischemic changes , no palpable pulsatile mass in suprapubic, unable to feel fem-fem pulse due to obesity, on Sonosite: there appears to be a dilated segment of the fem-fem graft, no incisions overlying the dilated segment   Non-Invasive Vascular Imaging ABI (Date: 02/05/15)  R: 0.83, DP: bi, PT: tri, TBI: 0.33  L: 1.00, DP: bi,  PT: bi, TBI: 0.58  Fem-fem duplex (02/05/15)  Pseudoaneurysmal segment in mid-graft: mostly clotted, 1.9 cm x 1.29 cm  No mass in right groin,  Possible R graft stenosis >50%: not all measurements consistent with such  Patent fem-fem BPG   Medical Decision Making  Deanna [name] is a 74 y.o. female who presents with: h/o LLE critical limb ischemia requiring R to L femorofemoral BPG, s/p L SCA to CCA bypass, s/p fall with back injury   Non-invasive findings not consistent with patient's MOI and sx.  No evidence of R groin mass.  Mid-segment PSA no consistent with fall injury.  PSA already in process of occluding, so would manage with surveillance.  I suspect much of this patient's sx are back related.  Based on the patient's vascular studies and examination, I have offered the patient: q3 aortoiliac duplex and ABI and follow up with Dr. Trula Slade.  I discussed in depth with the patient the nature of atherosclerosis, and emphasized the importance of maximal medical management including strict control of blood pressure, blood glucose, and lipid levels, antiplatelet agents, obtaining regular exercise, and cessation of smoking.    The patient is aware that without maximal medical management the underlying atherosclerotic disease process will progress, limiting the benefit of any interventions. The patient is currently on a statin: Crestor. The patient is currently on an anti-platelet: Plavix.  Thank you for allowing [place] to participate in this patient's care.   Adele Barthel, MD [facility] Office: [phone] Pager: [phone]  02/25/2015, 3:57 PM

## 2015-02-26 NOTE — Addendum Note (Signed)
Addended by: Dorthula Rue L on: 02/26/2015 09:34 AM   Modules accepted: Orders

## 2015-03-03 ENCOUNTER — Ambulatory Visit
Admission: RE | Admit: 2015-03-03 | Discharge: 2015-03-03 | Disposition: A | Discharge status: Home / Self Care | Payer: Commercial Managed Care - HMO | Source: Ambulatory Visit | Attending: Orthopedic Surgery | Admitting: Orthopedic Surgery

## 2015-03-03 ENCOUNTER — Ambulatory Visit: Payer: Commercial Managed Care - HMO | Admitting: Neurology

## 2015-03-03 DIAGNOSIS — M5441 Lumbago with sciatica, right side: Secondary | ICD-10-CM

## 2015-03-03 DIAGNOSIS — M5136 Other intervertebral disc degeneration, lumbar region: Secondary | ICD-10-CM | POA: Diagnosis not present

## 2015-03-03 DIAGNOSIS — M4316 Spondylolisthesis, lumbar region: Secondary | ICD-10-CM | POA: Diagnosis not present

## 2015-03-03 MED ORDER — IOHEXOL 180 MG/ML  SOLN
20.0000 mL | Freq: Once | INTRAMUSCULAR | Status: AC | PRN
  Administered 2015-03-03: 20 mL via INTRATHECAL

## 2015-03-03 NOTE — Progress Notes (Signed)
Pt states she has been off Plavix for the last 5 days and off Sertraline for the past 2 days.

## 2015-03-03 NOTE — Discharge Instructions (Signed)
Myelogram Discharge Instructions  1. Go home and rest quietly for the next 24 hours.  It is important to lie flat for the next 24 hours.  Get up only to go to the restroom.  You may lie in the bed or on a couch on your back, your stomach, your left side or your right side.  You may have one pillow under your head.  You may have pillows between your knees while you are on your side or under your knees while you are on your back.  2. DO NOT drive today.  Recline the seat as far back as it will go, while still wearing your seat belt, on the way home.  3. You may get up to go to the bathroom as needed.  You may sit up for 10 minutes to eat.  You may resume your normal diet and medications unless otherwise indicated.  Drink lots of extra fluids today and tomorrow.  4. The incidence of headache, nausea, or vomiting is about 5% (one in 20 patients).  If you develop a headache, lie flat and drink plenty of fluids until the headache goes away.  Caffeinated beverages may be helpful.  If you develop severe nausea and vomiting or a headache that does not go away with flat bed rest, call [phone].  5. You may resume normal activities after your 24 hours of bed rest is over; however, do not exert yourself strongly or do any heavy lifting tomorrow. If when you get up you have a headache when standing, go back to bed and force fluids for another 24 hours.  6. Call your physician for a follow-up appointment.  The results of your myelogram will be sent directly to your physician by the following day.  7. If you have any questions or if complications develop after you arrive home, please call [phone].  Discharge instructions have been explained to the patient.  The patient, or the person responsible for the patient, fully understands these instructions.       May resume PLAVIX today.   May resume Sertraline on March 04, 2015, after 1:00 pm.

## 2015-03-04 ENCOUNTER — Inpatient Hospital Stay
Admission: RE | Admit: 2015-03-04 | Discharge: 2015-03-04 | Disposition: A | Discharge status: Home / Self Care | Payer: Self-pay | Source: Ambulatory Visit | Attending: Orthopedic Surgery | Admitting: Orthopedic Surgery

## 2015-03-04 ENCOUNTER — Other Ambulatory Visit: Payer: Self-pay | Admitting: Orthopedic Surgery

## 2015-03-04 DIAGNOSIS — M5441 Lumbago with sciatica, right side: Secondary | ICD-10-CM

## 2015-03-13 ENCOUNTER — Encounter: Payer: Self-pay | Admitting: Cardiovascular Disease

## 2015-04-26 ENCOUNTER — Inpatient Hospital Stay (HOSPITAL_COMMUNITY): Payer: Commercial Managed Care - HMO

## 2015-04-26 ENCOUNTER — Encounter (HOSPITAL_COMMUNITY): Payer: Commercial Managed Care - HMO

## 2015-04-26 ENCOUNTER — Encounter (HOSPITAL_COMMUNITY): Payer: Self-pay | Admitting: *Deleted

## 2015-04-26 ENCOUNTER — Emergency Department (HOSPITAL_COMMUNITY): Payer: Commercial Managed Care - HMO

## 2015-04-26 ENCOUNTER — Inpatient Hospital Stay (HOSPITAL_COMMUNITY)
Admission: EM | Admit: 2015-04-26 | Discharge: 2015-04-30 | DRG: 871 | Disposition: A | Discharge status: Home Health | Payer: Commercial Managed Care - HMO | Attending: Internal Medicine | Admitting: Internal Medicine

## 2015-04-26 DIAGNOSIS — E872 Acidosis, unspecified: Secondary | ICD-10-CM | POA: Diagnosis present

## 2015-04-26 DIAGNOSIS — I2510 Atherosclerotic heart disease of native coronary artery without angina pectoris: Secondary | ICD-10-CM | POA: Diagnosis present

## 2015-04-26 DIAGNOSIS — R7881 Bacteremia: Secondary | ICD-10-CM

## 2015-04-26 DIAGNOSIS — Z96652 Presence of left artificial knee joint: Secondary | ICD-10-CM | POA: Diagnosis present

## 2015-04-26 DIAGNOSIS — Z79899 Other long term (current) drug therapy: Secondary | ICD-10-CM

## 2015-04-26 DIAGNOSIS — Z86010 Personal history of colonic polyps: Secondary | ICD-10-CM

## 2015-04-26 DIAGNOSIS — Z7902 Long term (current) use of antithrombotics/antiplatelets: Secondary | ICD-10-CM

## 2015-04-26 DIAGNOSIS — K219 Gastro-esophageal reflux disease without esophagitis: Secondary | ICD-10-CM | POA: Diagnosis present

## 2015-04-26 DIAGNOSIS — I252 Old myocardial infarction: Secondary | ICD-10-CM

## 2015-04-26 DIAGNOSIS — R0600 Dyspnea, unspecified: Secondary | ICD-10-CM | POA: Diagnosis not present

## 2015-04-26 DIAGNOSIS — Z951 Presence of aortocoronary bypass graft: Secondary | ICD-10-CM | POA: Diagnosis not present

## 2015-04-26 DIAGNOSIS — J9600 Acute respiratory failure, unspecified whether with hypoxia or hypercapnia: Secondary | ICD-10-CM | POA: Diagnosis not present

## 2015-04-26 DIAGNOSIS — R7989 Other specified abnormal findings of blood chemistry: Secondary | ICD-10-CM

## 2015-04-26 DIAGNOSIS — I774 Celiac artery compression syndrome: Secondary | ICD-10-CM | POA: Diagnosis present

## 2015-04-26 DIAGNOSIS — I10 Essential (primary) hypertension: Secondary | ICD-10-CM | POA: Diagnosis not present

## 2015-04-26 DIAGNOSIS — I25708 Atherosclerosis of coronary artery bypass graft(s), unspecified, with other forms of angina pectoris: Secondary | ICD-10-CM | POA: Diagnosis not present

## 2015-04-26 DIAGNOSIS — B9620 Unspecified Escherichia coli [E. coli] as the cause of diseases classified elsewhere: Secondary | ICD-10-CM

## 2015-04-26 DIAGNOSIS — E785 Hyperlipidemia, unspecified: Secondary | ICD-10-CM | POA: Diagnosis present

## 2015-04-26 DIAGNOSIS — J984 Other disorders of lung: Secondary | ICD-10-CM

## 2015-04-26 DIAGNOSIS — E039 Hypothyroidism, unspecified: Secondary | ICD-10-CM | POA: Diagnosis present

## 2015-04-26 DIAGNOSIS — T82898S Other specified complication of vascular prosthetic devices, implants and grafts, sequela: Secondary | ICD-10-CM | POA: Diagnosis not present

## 2015-04-26 DIAGNOSIS — G8929 Other chronic pain: Secondary | ICD-10-CM | POA: Diagnosis present

## 2015-04-26 DIAGNOSIS — F419 Anxiety disorder, unspecified: Secondary | ICD-10-CM | POA: Diagnosis present

## 2015-04-26 DIAGNOSIS — I771 Stricture of artery: Secondary | ICD-10-CM | POA: Diagnosis present

## 2015-04-26 DIAGNOSIS — R5381 Other malaise: Secondary | ICD-10-CM | POA: Diagnosis present

## 2015-04-26 DIAGNOSIS — Z9989 Dependence on other enabling machines and devices: Secondary | ICD-10-CM

## 2015-04-26 DIAGNOSIS — R32 Unspecified urinary incontinence: Secondary | ICD-10-CM | POA: Diagnosis present

## 2015-04-26 DIAGNOSIS — F329 Major depressive disorder, single episode, unspecified: Secondary | ICD-10-CM | POA: Diagnosis present

## 2015-04-26 DIAGNOSIS — A4151 Sepsis due to Escherichia coli [E. coli]: Principal | ICD-10-CM | POA: Diagnosis present

## 2015-04-26 DIAGNOSIS — R1084 Generalized abdominal pain: Secondary | ICD-10-CM | POA: Diagnosis not present

## 2015-04-26 DIAGNOSIS — G4733 Obstructive sleep apnea (adult) (pediatric): Secondary | ICD-10-CM | POA: Diagnosis present

## 2015-04-26 DIAGNOSIS — I70209 Unspecified atherosclerosis of native arteries of extremities, unspecified extremity: Secondary | ICD-10-CM | POA: Diagnosis present

## 2015-04-26 DIAGNOSIS — J9601 Acute respiratory failure with hypoxia: Secondary | ICD-10-CM | POA: Diagnosis present

## 2015-04-26 DIAGNOSIS — Z9119 Patient's noncompliance with other medical treatment and regimen: Secondary | ICD-10-CM | POA: Diagnosis present

## 2015-04-26 DIAGNOSIS — R300 Dysuria: Secondary | ICD-10-CM | POA: Diagnosis not present

## 2015-04-26 DIAGNOSIS — N12 Tubulo-interstitial nephritis, not specified as acute or chronic: Secondary | ICD-10-CM | POA: Diagnosis not present

## 2015-04-26 DIAGNOSIS — R0602 Shortness of breath: Secondary | ICD-10-CM | POA: Diagnosis present

## 2015-04-26 DIAGNOSIS — A419 Sepsis, unspecified organism: Secondary | ICD-10-CM | POA: Diagnosis present

## 2015-04-26 DIAGNOSIS — Z7982 Long term (current) use of aspirin: Secondary | ICD-10-CM

## 2015-04-26 DIAGNOSIS — R509 Fever, unspecified: Secondary | ICD-10-CM | POA: Diagnosis not present

## 2015-04-26 DIAGNOSIS — M545 Low back pain: Secondary | ICD-10-CM | POA: Diagnosis present

## 2015-04-26 DIAGNOSIS — Z23 Encounter for immunization: Secondary | ICD-10-CM

## 2015-04-26 DIAGNOSIS — K5900 Constipation, unspecified: Secondary | ICD-10-CM

## 2015-04-26 DIAGNOSIS — I959 Hypotension, unspecified: Secondary | ICD-10-CM | POA: Diagnosis present

## 2015-04-26 DIAGNOSIS — I248 Other forms of acute ischemic heart disease: Secondary | ICD-10-CM | POA: Diagnosis not present

## 2015-04-26 DIAGNOSIS — R778 Other specified abnormalities of plasma proteins: Secondary | ICD-10-CM

## 2015-04-26 DIAGNOSIS — R071 Chest pain on breathing: Secondary | ICD-10-CM | POA: Diagnosis not present

## 2015-04-26 DIAGNOSIS — J189 Pneumonia, unspecified organism: Secondary | ICD-10-CM | POA: Diagnosis present

## 2015-04-26 DIAGNOSIS — R109 Unspecified abdominal pain: Secondary | ICD-10-CM

## 2015-04-26 DIAGNOSIS — D696 Thrombocytopenia, unspecified: Secondary | ICD-10-CM | POA: Diagnosis not present

## 2015-04-26 DIAGNOSIS — R51 Headache: Secondary | ICD-10-CM | POA: Diagnosis not present

## 2015-04-26 DIAGNOSIS — F341 Dysthymic disorder: Secondary | ICD-10-CM | POA: Diagnosis present

## 2015-04-26 DIAGNOSIS — I9589 Other hypotension: Secondary | ICD-10-CM | POA: Diagnosis not present

## 2015-04-26 DIAGNOSIS — R000 Tachycardia, unspecified: Secondary | ICD-10-CM | POA: Diagnosis not present

## 2015-04-26 HISTORY — DX: Other disorders of lung: J98.4

## 2015-04-26 HISTORY — DX: Pneumonia, unspecified organism: J18.9

## 2015-04-26 LAB — COMPREHENSIVE METABOLIC PANEL
ALBUMIN: 3.8 g/dL (ref 3.5–5.0)
ALK PHOS: 102 U/L (ref 38–126)
ALT: 20 U/L (ref 14–54)
AST: 27 U/L (ref 15–41)
Anion gap: 11 (ref 5–15)
BILIRUBIN TOTAL: 1.4 mg/dL — AB (ref 0.3–1.2)
BUN: 19 mg/dL (ref 6–20)
CALCIUM: 9 mg/dL (ref 8.9–10.3)
CO2: 24 mmol/L (ref 22–32)
CREATININE: 1 mg/dL (ref 0.44–1.00)
Chloride: 101 mmol/L (ref 101–111)
GFR calc Af Amer: >60 mL/min (ref >60)
GFR calc non Af Amer: 55 mL/min — ABNORMAL LOW (ref >60)
Glucose, Bld: 178 mg/dL — ABNORMAL HIGH (ref 65–99)
Potassium: 4.1 mmol/L (ref 3.5–5.1)
SODIUM: 136 mmol/L (ref 135–145)
TOTAL PROTEIN: 6.8 g/dL (ref 6.5–8.1)

## 2015-04-26 LAB — URINALYSIS, ROUTINE W REFLEX MICROSCOPIC
BILIRUBIN URINE: NEGATIVE
Bilirubin Urine: NEGATIVE
GLUCOSE, UA: NEGATIVE mg/dL
Glucose, UA: NEGATIVE mg/dL
KETONES UR: NEGATIVE mg/dL
Ketones, ur: NEGATIVE mg/dL
Nitrite: POSITIVE — AB
Nitrite: NEGATIVE
PH: 5.5 (ref 5.0–8.0)
PROTEIN: NEGATIVE mg/dL
Protein, ur: 100 mg/dL — AB
Specific Gravity, Urine: 1.042 — ABNORMAL HIGH (ref 1.005–1.030)
Specific Gravity, Urine: <1.005 — ABNORMAL LOW (ref 1.005–1.030)
UROBILINOGEN UA: 0.2 mg/dL (ref 0.0–1.0)
Urobilinogen, UA: 0.2 mg/dL (ref 0.0–1.0)
pH: 6 (ref 5.0–8.0)

## 2015-04-26 LAB — I-STAT ARTERIAL BLOOD GAS, ED
Acid-base deficit: 2 mmol/L (ref 0.0–2.0)
BICARBONATE: 24.2 meq/L — AB (ref 20.0–24.0)
O2 SAT: 96 %
PCO2 ART: 45.5 mmHg — AB (ref 35.0–45.0)
TCO2: 26 mmol/L (ref 0–100)
pH, Arterial: 7.337 — ABNORMAL LOW (ref 7.350–7.450)
pO2, Arterial: 91 mmHg (ref 80.0–100.0)

## 2015-04-26 LAB — CBC WITH DIFFERENTIAL/PLATELET
BASOS PCT: 0 %
Basophils Absolute: 0 10*3/uL (ref 0.0–0.1)
Eosinophils Absolute: 0 10*3/uL (ref 0.0–0.7)
Eosinophils Relative: 0 %
HEMATOCRIT: 43.2 % (ref 36.0–46.0)
HEMOGLOBIN: 14.9 g/dL (ref 12.0–15.0)
Lymphocytes Relative: 12 %
Lymphs Abs: 1 10*3/uL (ref 0.7–4.0)
MCH: 29.1 pg (ref 26.0–34.0)
MCHC: 34.5 g/dL (ref 30.0–36.0)
MCV: 84.4 fL (ref 78.0–100.0)
Monocytes Absolute: 0.2 10*3/uL (ref 0.1–1.0)
Monocytes Relative: 2 %
NEUTROS ABS: 7.6 10*3/uL (ref 1.7–7.7)
NEUTROS PCT: 86 %
Platelets: 136 10*3/uL — ABNORMAL LOW (ref 150–400)
RBC: 5.12 MIL/uL — AB (ref 3.87–5.11)
RDW: 13.4 % (ref 11.5–15.5)
WBC: 8.8 10*3/uL (ref 4.0–10.5)

## 2015-04-26 LAB — SODIUM, URINE, RANDOM: SODIUM UR: 24 mmol/L

## 2015-04-26 LAB — STREP PNEUMONIAE URINARY ANTIGEN: STREP PNEUMO URINARY ANTIGEN: NEGATIVE

## 2015-04-26 LAB — URINE MICROSCOPIC-ADD ON

## 2015-04-26 LAB — I-STAT TROPONIN, ED
TROPONIN I, POC: 0.06 ng/mL (ref 0.00–0.08)
Troponin i, poc: 0.49 ng/mL (ref 0.00–0.08)

## 2015-04-26 LAB — PROTIME-INR
INR: 1.27 (ref 0.00–1.49)
Prothrombin Time: 16 seconds — ABNORMAL HIGH (ref 11.6–15.2)

## 2015-04-26 LAB — CORTISOL: CORTISOL PLASMA: 10.2 ug/dL

## 2015-04-26 LAB — TROPONIN I
TROPONIN I: 0.72 ng/mL — AB (ref <0.031)
Troponin I: 0.85 ng/mL (ref <0.031)

## 2015-04-26 LAB — APTT: APTT: 31 s (ref 24–37)

## 2015-04-26 LAB — MRSA PCR SCREENING: MRSA by PCR: NEGATIVE

## 2015-04-26 LAB — PROCALCITONIN: PROCALCITONIN: 3.22 ng/mL

## 2015-04-26 LAB — I-STAT CG4 LACTIC ACID, ED
Lactic Acid, Venous: 2.55 mmol/L (ref 0.5–2.0)
Lactic Acid, Venous: 1.13 mmol/L (ref 0.5–2.0)
Lactic Acid, Venous: 1.88 mmol/L (ref 0.5–2.0)

## 2015-04-26 LAB — INFLUENZA PANEL BY PCR (TYPE A & B)
H1N1 flu by pcr: NOT DETECTED
INFLAPCR: NEGATIVE
INFLBPCR: NEGATIVE

## 2015-04-26 LAB — BRAIN NATRIURETIC PEPTIDE: B Natriuretic Peptide: 235.1 pg/mL — ABNORMAL HIGH (ref 0.0–100.0)

## 2015-04-26 LAB — CK: Total CK: 120 U/L (ref 38–234)

## 2015-04-26 LAB — D-DIMER, QUANTITATIVE: D-Dimer, Quant: 1.76 ug/mL-FEU — ABNORMAL HIGH (ref 0.00–0.48)

## 2015-04-26 MED ORDER — TRAMADOL HCL 50 MG PO TABS
50.0000 mg | ORAL_TABLET | Freq: Once | ORAL | Status: AC
  Administered 2015-04-26: 50 mg via ORAL
  Filled 2015-04-26: qty 1

## 2015-04-26 MED ORDER — CETYLPYRIDINIUM CHLORIDE 0.05 % MT LIQD
7.0000 mL | Freq: Two times a day (BID) | OROMUCOSAL | Status: DC
  Administered 2015-04-26 – 2015-04-29 (×5): 7 mL via OROMUCOSAL

## 2015-04-26 MED ORDER — ONDANSETRON HCL 4 MG/2ML IJ SOLN
4.0000 mg | Freq: Four times a day (QID) | INTRAMUSCULAR | Status: DC | PRN
  Administered 2015-04-26 – 2015-04-29 (×5): 4 mg via INTRAVENOUS
  Filled 2015-04-26 (×5): qty 2

## 2015-04-26 MED ORDER — FAMOTIDINE 20 MG PO TABS
20.0000 mg | ORAL_TABLET | Freq: Every day | ORAL | Status: DC
  Administered 2015-04-26 – 2015-04-28 (×3): 20 mg via ORAL
  Filled 2015-04-26 (×3): qty 1

## 2015-04-26 MED ORDER — SODIUM CHLORIDE 0.9 % IV BOLUS (SEPSIS)
500.0000 mL | Freq: Once | INTRAVENOUS | Status: AC
  Administered 2015-04-26: 500 mL via INTRAVENOUS

## 2015-04-26 MED ORDER — METOPROLOL TARTRATE 12.5 MG HALF TABLET
12.5000 mg | ORAL_TABLET | Freq: Two times a day (BID) | ORAL | Status: DC
  Administered 2015-04-26 – 2015-04-30 (×6): 12.5 mg via ORAL
  Filled 2015-04-26 (×8): qty 1

## 2015-04-26 MED ORDER — PERFLUTREN LIPID MICROSPHERE
1.0000 mL | INTRAVENOUS | Status: AC | PRN
  Administered 2015-04-26: 2 mL via INTRAVENOUS
  Filled 2015-04-26 (×2): qty 10

## 2015-04-26 MED ORDER — ROSUVASTATIN CALCIUM 10 MG PO TABS
10.0000 mg | ORAL_TABLET | Freq: Every day | ORAL | Status: DC
  Administered 2015-04-26 – 2015-04-30 (×5): 10 mg via ORAL
  Filled 2015-04-26 (×5): qty 1

## 2015-04-26 MED ORDER — DOCUSATE SODIUM 100 MG PO CAPS
100.0000 mg | ORAL_CAPSULE | Freq: Two times a day (BID) | ORAL | Status: DC
  Administered 2015-04-26 – 2015-04-30 (×8): 100 mg via ORAL
  Filled 2015-04-26 (×6): qty 1

## 2015-04-26 MED ORDER — LEVOTHYROXINE SODIUM 200 MCG PO TABS
200.0000 ug | ORAL_TABLET | Freq: Every day | ORAL | Status: DC
  Administered 2015-04-26 – 2015-04-30 (×5): 200 ug via ORAL
  Filled 2015-04-26 (×2): qty 1
  Filled 2015-04-26 (×4): qty 2

## 2015-04-26 MED ORDER — LORAZEPAM 0.5 MG PO TABS
0.5000 mg | ORAL_TABLET | Freq: Four times a day (QID) | ORAL | Status: DC | PRN
  Administered 2015-04-26 – 2015-04-29 (×7): 0.5 mg via ORAL
  Filled 2015-04-26 (×7): qty 1

## 2015-04-26 MED ORDER — INFLUENZA VAC SPLIT QUAD 0.5 ML IM SUSY
0.5000 mL | PREFILLED_SYRINGE | INTRAMUSCULAR | Status: DC
  Filled 2015-04-26: qty 0.5

## 2015-04-26 MED ORDER — PANTOPRAZOLE SODIUM 40 MG PO TBEC
40.0000 mg | DELAYED_RELEASE_TABLET | Freq: Every day | ORAL | Status: DC
  Administered 2015-04-26 – 2015-04-30 (×5): 40 mg via ORAL
  Filled 2015-04-26 (×6): qty 1

## 2015-04-26 MED ORDER — AZITHROMYCIN 500 MG IV SOLR
500.0000 mg | INTRAVENOUS | Status: DC
  Administered 2015-04-26: 500 mg via INTRAVENOUS
  Filled 2015-04-26 (×2): qty 500

## 2015-04-26 MED ORDER — TRAMADOL HCL 50 MG PO TABS
50.0000 mg | ORAL_TABLET | Freq: Two times a day (BID) | ORAL | Status: DC | PRN
  Administered 2015-04-26 – 2015-04-29 (×3): 50 mg via ORAL
  Filled 2015-04-26 (×3): qty 1

## 2015-04-26 MED ORDER — PNEUMOCOCCAL VAC POLYVALENT 25 MCG/0.5ML IJ INJ
0.5000 mL | INJECTION | INTRAMUSCULAR | Status: DC
  Filled 2015-04-26: qty 0.5

## 2015-04-26 MED ORDER — ASPIRIN EC 81 MG PO TBEC
81.0000 mg | DELAYED_RELEASE_TABLET | Freq: Every day | ORAL | Status: DC
  Administered 2015-04-26 – 2015-04-27 (×2): 81 mg via ORAL
  Filled 2015-04-26 (×3): qty 1

## 2015-04-26 MED ORDER — DEXTROSE 5 % IV SOLN
1.0000 g | Freq: Once | INTRAVENOUS | Status: AC
  Administered 2015-04-26: 1 g via INTRAVENOUS
  Filled 2015-04-26: qty 10

## 2015-04-26 MED ORDER — SODIUM CHLORIDE 0.9 % IV BOLUS (SEPSIS)
1000.0000 mL | Freq: Once | INTRAVENOUS | Status: AC
  Administered 2015-04-26: 1000 mL via INTRAVENOUS

## 2015-04-26 MED ORDER — SODIUM CHLORIDE 0.9 % IV SOLN
INTRAVENOUS | Status: DC
  Administered 2015-04-26 (×2): via INTRAVENOUS
  Administered 2015-04-27: 100 mL via INTRAVENOUS

## 2015-04-26 MED ORDER — ENOXAPARIN SODIUM 40 MG/0.4ML ~~LOC~~ SOLN
40.0000 mg | SUBCUTANEOUS | Status: DC
  Administered 2015-04-26 – 2015-04-29 (×4): 40 mg via SUBCUTANEOUS
  Filled 2015-04-26 (×4): qty 0.4

## 2015-04-26 MED ORDER — PIPERACILLIN-TAZOBACTAM 3.375 G IVPB
3.3750 g | Freq: Three times a day (TID) | INTRAVENOUS | Status: DC
  Administered 2015-04-26 – 2015-04-28 (×6): 3.375 g via INTRAVENOUS
  Filled 2015-04-26 (×7): qty 50

## 2015-04-26 MED ORDER — PERFLUTREN LIPID MICROSPHERE: 1.0000 mL | INTRAVENOUS | Status: DC | PRN

## 2015-04-26 MED ORDER — IOHEXOL 350 MG/ML SOLN
100.0000 mL | Freq: Once | INTRAVENOUS | Status: AC | PRN
  Administered 2015-04-26: 100 mL via INTRAVENOUS

## 2015-04-26 MED ORDER — DEXTROSE 5 % IV SOLN
1.0000 g | INTRAVENOUS | Status: DC
  Filled 2015-04-26: qty 10

## 2015-04-26 MED ORDER — POLYETHYLENE GLYCOL 3350 17 G PO PACK
17.0000 g | PACK | Freq: Every day | ORAL | Status: DC
  Administered 2015-04-26 – 2015-04-28 (×3): 17 g via ORAL
  Filled 2015-04-26 (×4): qty 1

## 2015-04-26 MED ORDER — KETOROLAC TROMETHAMINE 15 MG/ML IJ SOLN
15.0000 mg | Freq: Once | INTRAMUSCULAR | Status: AC
  Administered 2015-04-26: 15 mg via INTRAVENOUS
  Filled 2015-04-26: qty 1

## 2015-04-26 MED ORDER — VANCOMYCIN HCL IN DEXTROSE 750-5 MG/150ML-% IV SOLN
750.0000 mg | Freq: Two times a day (BID) | INTRAVENOUS | Status: DC
  Administered 2015-04-26 – 2015-04-27 (×2): 750 mg via INTRAVENOUS
  Filled 2015-04-26 (×3): qty 150

## 2015-04-26 MED ORDER — CLOPIDOGREL BISULFATE 75 MG PO TABS
75.0000 mg | ORAL_TABLET | Freq: Once | ORAL | Status: AC
  Administered 2015-04-26: 75 mg via ORAL
  Filled 2015-04-26: qty 1

## 2015-04-26 NOTE — ED Notes (Signed)
Dr Lita Mains given a copy of lactic acid results 2.55

## 2015-04-26 NOTE — Progress Notes (Signed)
Erin Hearing NP made aware of pt BP 97/21. New orders received. Will continue to monitor closely.

## 2015-04-26 NOTE — Progress Notes (Signed)
ANTIBIOTIC CONSULT NOTE - INITIAL  Pharmacy Consult for vancomycin and Zosyn Indication: bacteremia (GNR)  Allergies  Allergen Reactions  . Vicodin [Hydrocodone-Acetaminophen] Other (See Comments)    unknown    Patient Measurements: Height: 5\' 2"  (157.5 cm) Weight: 190 lb (86.183 kg) IBW/kg (Calculated) : 50.1   Vital Signs: Temp: 99.2 F (37.3 C) (09/18 1600) Temp Source: Oral (09/18 1600) BP: 88/27 mmHg (09/18 1730) Pulse Rate: 96 (09/18 1730) Intake/Output from previous day: 09/17 0701 - 09/18 0700 In: 1500 [I.V.:1500] Out: -  Intake/Output from this shift: Total I/O In: 2074.2 [P.O.:150; I.V.:1674.2; IV Piggyback:250] Out: 370 [Urine:370]  Labs:  Recent Labs  04/26/15 0056  WBC 8.8  HGB 14.9  PLT 136*  CREATININE 1.00   Estimated Creatinine Clearance: 51 mL/min (by C-G formula based on Cr of 1). No results for input(s): VANCOTROUGH, VANCOPEAK, VANCORANDOM, GENTTROUGH, GENTPEAK, GENTRANDOM, TOBRATROUGH, TOBRAPEAK, TOBRARND, AMIKACINPEAK, AMIKACINTROU, AMIKACIN in the last 72 hours.   Microbiology: Recent Results (from the past 720 hour(s))  Culture, blood (routine x 2)     Status: None (Preliminary result)   Collection Time: 04/26/15  1:43 AM  Result Value Ref Range Status   Specimen Description BLOOD LEFT HAND  Final   Special Requests BOTTLES DRAWN AEROBIC AND ANAEROBIC 4CC  Final   Culture  Setup Time   Final    GRAM NEGATIVE RODS AEROBIC BOTTLE ONLY CRITICAL RESULT CALLED TO, READ BACK BY AND VERIFIED WITH: C.WOLF,RN 04/26/15 @ 1755 BY V.WILKINS CONFIRMED BY K.WOOLLEN    Culture PENDING  Incomplete   Report Status PENDING  Incomplete  MRSA PCR Screening     Status: None   Collection Time: 04/26/15  1:54 PM  Result Value Ref Range Status   MRSA by PCR NEGATIVE NEGATIVE Final    Comment:        The GeneXpert MRSA Assay (FDA approved for NASAL specimens only), is one component of a comprehensive MRSA colonization surveillance program. It is  not intended to diagnose MRSA infection nor to guide or monitor treatment for MRSA infections.     Medical History: Past Medical History  Diagnosis Date  . Hypertension   . Colon polyps   . Hemorrhoids   . Hypothyroidism   . GERD (gastroesophageal reflux disease)   . Anxiety   . Depression   . Myocardial infarction   . CAD (coronary artery disease)     followed by dr [name].  . Sinus drainage     took z-pack   finished yesterday  . Dysuria   . Atherosclerotic peripheral vascular disease   . Urticaria   . Vertigo, benign positional   . Peripheral arterial disease     Medications:  Scheduled:  . antiseptic oral rinse  7 mL Mouth Rinse BID  . aspirin EC  81 mg Oral Daily  . docusate sodium  100 mg Oral BID  . enoxaparin (LOVENOX) injection  40 mg Subcutaneous Q24H  . famotidine  20 mg Oral Daily  . [START ON 04/27/2015] Influenza vac split quadrivalent PF  0.5 mL Intramuscular Tomorrow-1000  . levothyroxine  200 mcg Oral QAC breakfast  . metoprolol tartrate  12.5 mg Oral BID  . pantoprazole  40 mg Oral Daily  . piperacillin-tazobactam (ZOSYN)  IV  3.375 g Intravenous 3 times per day  . [START ON 04/27/2015] pneumococcal 23 valent vaccine  0.5 mL Intramuscular Tomorrow-1000  . polyethylene glycol  17 g Oral Daily  . rosuvastatin  10 mg Oral Daily  . vancomycin  750 mg Intravenous Q12H   Assessment: 74 year old woman on ceftriaxone and azithromycin to have antibiotics changed to Zosyn and vancomycin for gram negative rod bacteremia/sepsis.  Goal of Therapy:  Vancomycin trough level 15-20 mcg/ml  Plan:  Measure antibiotic drug levels at steady state Follow up culture results Vancomycin 750mg  IV q12h  Zosyn 3.375g IV q8h (infuse over 4 hours)   Candie Mile 04/26/2015,6:21 PM

## 2015-04-26 NOTE — ED Notes (Signed)
NP Lissa Merlin at the bedside.
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
## 2015-04-26 NOTE — Progress Notes (Signed)
CPAP on hold at this time due to pt nausea. RT will continue to monitor.

## 2015-04-26 NOTE — ED Provider Notes (Signed)
CSN: [id number]   Arrival date & time 04/26/15 0039  History  This chart was scribed for Deanna Costa, MD by Altamease Oiler, ED Scribe. This patient was seen in room [name] and the patient's care was started at 12:39 AM.  Chief Complaint  Patient presents with  . Shortness of Breath    HPI The history is provided by the patient and the EMS personnel. No language interpreter was used.   Brought in by EMS on CPAP, Deanna [name] is a 74 y.o. female with PMHx of MI, CAD s/p 4 vessel CABG  in 1999, and HTN  who presents to the Emergency Department complaining of respiratory distress with onset this morning. O2 saturation was in the 60s on RA for EMS and improved to 89% on CPAP. Pt states that she gets short of breath with laying flat and excitement. Pt denies cough,wheezing, LE pain or swelling. She wears CPAP at night. No known history of COPD or asthma.   Past Medical History  Diagnosis Date  . Hypertension   . Colon polyps   . Hemorrhoids   . Hypothyroidism   . GERD (gastroesophageal reflux disease)   . Anxiety   . Depression   . Myocardial infarction   . CAD (coronary artery disease)     followed by dr [name].  . Sinus drainage     took z-pack   finished yesterday  . Dysuria   . Atherosclerotic peripheral vascular disease   . Urticaria   . Vertigo, benign positional   . Peripheral arterial disease     Past Surgical History  Procedure Laterality Date  . Fem-fem bypass graft    . Coronary artery bypass graft    . Replacement total knee  05-2011  . Coronary angioplasty    . Carotid-subclavian bypass graft  12/15/2011    Procedure: BYPASS GRAFT CAROTID-SUBCLAVIAN;  Surgeon: Serafina Mitchell, MD;  Location: [facility] OR;  Service: Vascular;  Laterality: Left;  Left Carotid subclavian bypass  . Joint replacement      Left knee  . Unilateral upper extremeity angiogram Left 11/15/2011    Procedure: UNILATERAL UPPER EXTREMEITY ANGIOGRAM;  Surgeon: Lorretta Harp, MD;  Location: [facility]  CATH LAB;  Service: Cardiovascular;  Laterality: Left;  . Left heart catheterization with coronary/graft angiogram N/A 12/21/2011    Procedure: LEFT HEART CATHETERIZATION WITH Beatrix Fetters;  Surgeon: Leonie Man, MD;  Location: [facility] CATH LAB;  Service: Cardiovascular;  Laterality: N/A;  . Bilateral upper extremity angiogram N/A 09/11/2012    Procedure: BILATERAL UPPER EXTREMITY ANGIOGRAM;  Surgeon: Serafina Mitchell, MD;  Location: [facility] CATH LAB;  Service: Cardiovascular;  Laterality: N/A;  . Doppler echocardiography  05/27/2010, 09/17/2008    Mild Proximal septal thickening is noted. Left ventricular systolic functions is normal ejection fraction =>55%. the aortic valve appears to be mildly sclerotic   . Nm myocar perf ejection fraction  09/22/2009, 07/03/2007    the post stress myocardial perfusion images show a normal pattern of perfusion is all regions. The post-stress ejection fraction is 68 %. no significant wall motion abnormalities noted. This is a low risk scan.  . 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 patent left common carotid- subclavian artery bypass with turbulent flow noted at the anastomosis with velocities of 295 cm/s  . Holter monitor  01/21/2008  The predominant rhythm was normal sinus rhythm. Minimum heartrate of 63 bpm at +01:00, maximum heartrate of 105 bpm at + 10:35; and the average heartrate of 75 bpm. Ventricular ectopic activity totaled 1270: Multifocal; 866-PVC's and 404-VEs               Family History  Problem Relation Age of Onset  . Colon cancer Brother   . Heart attack Brother   . Hyperlipidemia Brother   . Hypertension Brother   . Heart disease Brother   . Heart attack Mother   . Diabetes Father   . Heart disease Father   . Hypertension  Father   . Hyperlipidemia Father   . Heart attack Father   . Diabetes Sister   . Heart disease Sister   . Hyperlipidemia Sister   . Hypertension Sister   . Heart attack Sister     Social History  Substance Use Topics  . Smoking status: Former Smoker    Types: Cigarettes    Quit date: 11/27/1981  . Smokeless tobacco: Never Used  . Alcohol Use: 0.0 oz/week    0 Standard drinks or equivalent per week     Comment: Rare     Review of Systems  Constitutional: Negative for fever and chills.  Respiratory: Positive for shortness of breath. Negative for cough.   Cardiovascular: Negative for chest pain, palpitations and leg swelling.  Gastrointestinal: Negative for nausea, vomiting, abdominal pain and diarrhea.  Genitourinary: Negative for dysuria, frequency and flank pain.  Musculoskeletal: Negative for back pain, neck pain and neck stiffness.  Skin: Negative for rash and wound.  Neurological: Positive for headaches. Negative for dizziness, weakness, light-headedness and numbness.  All other systems reviewed and are negative.  Home Medications   Prior to Admission medications   Medication Sig Start Date End Date Taking? Authorizing Provider  amLODipine (NORVASC) 5 MG tablet Take 1 tablet (5 mg total) by mouth daily. 07/29/13  Yes Lorretta Harp, MD  aspirin EC 81 MG tablet Take 81 mg by mouth daily.   Yes Historical Provider, MD  B Complex-C (B-COMPLEX WITH VITAMIN C) tablet Take 1 tablet by mouth daily.   Yes Historical Provider, MD  cholecalciferol (VITAMIN D) 1000 UNITS tablet Take 1,000 Units by mouth daily.   Yes Historical Provider, MD  clopidogrel (PLAVIX) 75 MG tablet Take 1 tablet (75 mg total) by mouth once. 06/24/13  Yes Lorretta Harp, MD  CRESTOR 10 MG tablet TAKE ONE TABLET BY MOUTH DAILY 02/06/13  Yes Terance Ice, MD  levothyroxine (SYNTHROID, LEVOTHROID) 200 MCG tablet Take 200 mcg by mouth daily before breakfast.  12/08/14  Yes Historical Provider, MD  LORazepam  (ATIVAN) 1 MG tablet Take 0.5 tablets (0.5 mg total) by mouth as needed for anxiety. For aniexty Patient taking differently: Take 0.5 mg by mouth as needed for anxiety.  12/29/11  Yes [name] J Roczniak, PA-C  metoprolol (LOPRESSOR) 50 MG tablet Take 50 mg by mouth 2 (two) times daily.    Yes Historical Provider, MD  ranitidine (ZANTAC) 150 MG capsule Take 150 mg by mouth daily.   Yes Historical Provider, MD  traMADol (ULTRAM) 50 MG tablet Take 50 mg by mouth every 12 (twelve) hours as needed for moderate pain.  02/24/15  Yes Historical Provider, MD  valsartan (DIOVAN) 80 MG tablet Take 80 mg by mouth every morning.    Yes Historical Provider, MD    Allergies  Vicodin  Triage Vitals: BP 110/45 mmHg  Pulse 131  Temp(Src)  99.7 F (37.6 C) (Oral)  Resp 10  Ht 5\' 2"  (1.575 m)  Wt 190 lb (86.183 kg)  BMI 34.74 kg/m2  SpO2 92%  Physical Exam  Constitutional: She is oriented to person, place, and time. She appears well-developed and well-nourished. No distress.  HENT:  Head: Normocephalic and atraumatic.  Mouth/Throat: Oropharynx is clear and moist. No oropharyngeal exudate.  Eyes: EOM are normal. Pupils are equal, round, and reactive to light.  Neck: Normal range of motion. Neck supple.  Cardiovascular: Normal rate and regular rhythm.   Pulmonary/Chest: Effort normal. No respiratory distress. She has no wheezes. She has rales.  Patient with crackles bilateral bases  Abdominal: Soft. Bowel sounds are normal. She exhibits no distension and no mass. There is no tenderness. There is no rebound and no guarding.  Musculoskeletal: Normal range of motion. She exhibits no edema or tenderness.  No lower extremity swelling or pain.  Neurological: She is alert and oriented to person, place, and time.  Moves all extremities without deficit. Sensation is fully intact.  Skin: Skin is warm and dry. No rash noted. No erythema.  Psychiatric: She has a normal mood and affect. Her behavior is normal.   Nursing note and vitals reviewed.   ED Course  Procedures   DIAGNOSTIC STUDIES: Oxygen Saturation is 92% on 5 L, adequate by my interpretation.    COORDINATION OF CARE: 12:48 AM Discussed treatment plan which includes CXR, EKG, and lab work with pt at bedside and pt agreed to plan.  1:59 AM I re-evaluated the patient and provided an update on the results of her testing so far.    Labs Reviewed  CBC WITH DIFFERENTIAL/PLATELET - Abnormal; Notable for the following:    RBC 5.12 (*)    Platelets 136 (*)    All other components within normal limits  COMPREHENSIVE METABOLIC PANEL - Abnormal; Notable for the following:    Glucose, Bld 178 (*)    Total Bilirubin 1.4 (*)    GFR calc non Af Amer 55 (*)    All other components within normal limits  BRAIN NATRIURETIC PEPTIDE - Abnormal; Notable for the following:    B Natriuretic Peptide 235.1 (*)    All other components within normal limits  URINALYSIS, ROUTINE W REFLEX MICROSCOPIC (NOT AT [facility]) - Abnormal; Notable for the following:    APPearance CLOUDY (*)    Specific Gravity, Urine 1.042 (*)    Hgb urine dipstick MODERATE (*)    Protein, ur 100 (*)    Nitrite POSITIVE (*)    Leukocytes, UA MODERATE (*)    All other components within normal limits  D-DIMER, QUANTITATIVE (NOT AT [facility]) - Abnormal; Notable for the following:    D-Dimer, Quant 1.76 (*)    All other components within normal limits  URINE MICROSCOPIC-ADD ON - Abnormal; Notable for the following:    Squamous Epithelial / LPF MANY (*)    Bacteria, UA MANY (*)    All other components within normal limits  I-STAT CG4 LACTIC ACID, ED - Abnormal; Notable for the following:    Lactic Acid, Venous 2.55 (*)    All other components within normal limits  I-STAT ARTERIAL BLOOD GAS, ED - Abnormal; Notable for the following:    pH, Arterial 7.337 (*)    pCO2 arterial 45.5 (*)    Bicarbonate 24.2 (*)    All other components within normal limits  CULTURE, BLOOD (ROUTINE X 2)   CULTURE, BLOOD (ROUTINE X 2)  URINE CULTURE  BLOOD GAS, ARTERIAL  PROCALCITONIN  URINALYSIS, ROUTINE W REFLEX MICROSCOPIC (NOT AT [facility])  SODIUM, URINE, RANDOM  I-STAT TROPOININ, ED  I-STAT CG4 LACTIC ACID, ED    Imaging Review Ct Angio Chest Pe W/cm &/or Wo Cm  04/26/2015   CLINICAL DATA:  Shortness of breath. Respiratory distress, onset this morning.  EXAM: CT ANGIOGRAPHY CHEST WITH CONTRAST  TECHNIQUE: Multidetector CT imaging of the chest was performed using the standard protocol during bolus administration of intravenous contrast. Multiplanar CT image reconstructions and MIPs were obtained to evaluate the vascular anatomy.  CONTRAST:  127mL OMNIPAQUE IOHEXOL 350 MG/ML SOLN  COMPARISON:  Radiograph earlier this day.  Chest CT 06/09/2011  FINDINGS: There are no filling defects within the pulmonary arteries to suggest pulmonary embolus.  Diffuse calcified and noncalcified atheromatous plaque throughout the thoracic aorta and its branches. A left carotid subclavian bypass graft is seen. Post CABG with atherosclerosis of the native coronary arteries. The heart is normal in size. There is no pleural or pericardial effusion. No mediastinal or hilar adenopathy.  Dependent atelectasis in both lower lobes. Patchy ground-glass opacities in the perihilar lungs, right greater than left. There is smooth septal thickening involving the lower lobes. No pulmonary nodule or suspicious mass.  Evaluation of the upper abdomen demonstrates distended gallbladder. 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  There are no acute or suspicious osseous abnormalities. Multilevel degenerative change throughout the spine with diffuse disc space narrowing endplate irregularity.  Review of the MIP images confirms the above findings.  IMPRESSION: 1. No pulmonary embolus. 2. Perihilar ground-glass opacities, suspicious for pulmonary edema. 3. Diffuse atherosclerosis of the aorta and its branches. 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   Electronically Signed   By: Jeb Levering M.D.   On: 04/26/2015 05:43   Dg Chest Port 1 View  04/26/2015   CLINICAL DATA:  74 year old female with shortness of breath and fever  EXAM: PORTABLE CHEST - 1 VIEW  COMPARISON:  Chest radiograph dated 12/19/2011  FINDINGS: Single-view of the chest demonstrates clear lungs. No pleural effusion or pneumothorax. Stable cardiac silhouette. Median sternotomy wires and CABG clips. Degenerative changes of the shoulders. No acute fracture.  IMPRESSION: No active disease.   Electronically Signed   By: Anner Crete M.D.   On: 04/26/2015 02:05    EKG Interpretation  Date/Time:    Ventricular Rate:    PR Interval:    QRS Duration:   QT Interval:    QTC Calculation:   R Axis:     Text Interpretation:      MDM   Final diagnoses:  Shortness of breath     I personally performed the services described in this documentation, which was scribed in my presence. The recorded information has been reviewed and is accurate.  Patient remained well-appearing in the emergency department. Maintaining saturations with 2 L of oxygen. Systolic blood pressure has been borderline low. She is mentating well. Mild elevation in lactic acid. Normal white blood cell count. Given normal saline in the emergency department and treated for urinary tract infection. D-dimer was elevated. CT angiogram of the chest without evidence of PE but probable pulmonary edema. Repeat blood gas though have low suspicion for sepsis.  Discussed with Dr. Blaine Hamper. [facility] will see patient in the emergency department and admit to step down bed.   Julianne Rice, MD 04/26/15 [phone]

## 2015-04-26 NOTE — ED Notes (Signed)
Pt. Called her daughter c/o a headache. Daughter called EMS due to patient stating she didn't feel well. Pt c/o of being cold and hot. On EMS arrival pt was hypoxic 60s on RA. C/o chest pain received aspirin and IM zofran. Seeing PCP for a AAA.

## 2015-04-26 NOTE — Progress Notes (Signed)
CRITICAL VALUE ALERT  Critical value received:  Blood Cultures- Aerobic Gram negative rods   Date of notification:  04/26/15  Time of notification:  [id number]  Critical value read back:Yes.    Nurse who received alert:  Rosebud Poles RN  MD notified (1st page):  Erin Hearing NP  Time of first page:  [id number]  Time MD responded:  1800

## 2015-04-26 NOTE — ED Notes (Signed)
Patient transported to CT 

## 2015-04-26 NOTE — Progress Notes (Signed)
Echocardiogram 2D Echocardiogram has been performed.  Deanna [name] 04/26/2015, 3:36 PM

## 2015-04-26 NOTE — Progress Notes (Signed)
CRITICAL VALUE ALERT  Critical value received:  Troponin 0.85   Date of notification:  04/26/15  Time of notification:  [id number]  Critical value read back:Yes.    Nurse who received alert:  Rosebud Poles RN  MD notified (1st page):  Erin Hearing NP  Time of first page:  [id number]  Time MD responded: 1800

## 2015-04-26 NOTE — ED Notes (Signed)
Dr Lita Mains updated on BP, fluids ordered and hung

## 2015-04-26 NOTE — ED Notes (Signed)
Pt's daughter, [name] [phone], pls call with any changes or if pt moves

## 2015-04-26 NOTE — Progress Notes (Addendum)
UA (repeat) with TNTC WBCs, rare bacteria and negative nitrate, sp gravity now <1.005 (was 1.042)- obtained after anbx's and volume resuscitation. Suspect does have UTI so will continue anbx's (PCT 3.22). CK normal so will resume statin, Abd XR with moderate stool burden so begin Colace and Miralax  Addendum: 112 pm: SBP up to 130s and Hr up low 100s- will decrease IVFs to 50/hr and add in very low dose BB at 12.5 mg BID (? BB withdrawal)  510 pm: BP 97/21- will continue low dose IV Lopressor to prevent breakthru tachycardia (does not influence afterload/DBP) but increase IVFs back up to 100/hr. Influenza PCR negative and 2nd TNI up subtly to 0.85- still suspect demand ischemia since no cardiac sx's reported by pt  Erin Hearing, ANP

## 2015-04-27 ENCOUNTER — Encounter (HOSPITAL_COMMUNITY): Payer: Self-pay | Admitting: Radiology

## 2015-04-27 ENCOUNTER — Inpatient Hospital Stay (HOSPITAL_COMMUNITY): Payer: Commercial Managed Care - HMO

## 2015-04-27 DIAGNOSIS — I9589 Other hypotension: Secondary | ICD-10-CM

## 2015-04-27 DIAGNOSIS — B9620 Unspecified Escherichia coli [E. coli] as the cause of diseases classified elsewhere: Secondary | ICD-10-CM

## 2015-04-27 DIAGNOSIS — R7881 Bacteremia: Secondary | ICD-10-CM

## 2015-04-27 DIAGNOSIS — D696 Thrombocytopenia, unspecified: Secondary | ICD-10-CM

## 2015-04-27 DIAGNOSIS — R7989 Other specified abnormal findings of blood chemistry: Secondary | ICD-10-CM

## 2015-04-27 DIAGNOSIS — R000 Tachycardia, unspecified: Secondary | ICD-10-CM

## 2015-04-27 DIAGNOSIS — R778 Other specified abnormalities of plasma proteins: Secondary | ICD-10-CM

## 2015-04-27 HISTORY — DX: Unspecified Escherichia coli (E. coli) as the cause of diseases classified elsewhere: B96.20

## 2015-04-27 HISTORY — DX: Bacteremia: R78.81

## 2015-04-27 LAB — COMPREHENSIVE METABOLIC PANEL
ALBUMIN: 2.7 g/dL — AB (ref 3.5–5.0)
ALK PHOS: 56 U/L (ref 38–126)
ALT: 15 U/L (ref 14–54)
ANION GAP: 6 (ref 5–15)
AST: 25 U/L (ref 15–41)
BILIRUBIN TOTAL: 1.6 mg/dL — AB (ref 0.3–1.2)
BUN: 14 mg/dL (ref 6–20)
CALCIUM: 7.8 mg/dL — AB (ref 8.9–10.3)
CO2: 23 mmol/L (ref 22–32)
Chloride: 105 mmol/L (ref 101–111)
Creatinine, Ser: 1 mg/dL (ref 0.44–1.00)
GFR, EST NON AFRICAN AMERICAN: 55 mL/min — AB (ref >60)
GLUCOSE: 122 mg/dL — AB (ref 65–99)
POTASSIUM: 3.7 mmol/L (ref 3.5–5.1)
Sodium: 134 mmol/L — ABNORMAL LOW (ref 135–145)
TOTAL PROTEIN: 5.3 g/dL — AB (ref 6.5–8.1)

## 2015-04-27 LAB — CBC WITH DIFFERENTIAL/PLATELET
BASOS ABS: 0 10*3/uL (ref 0.0–0.1)
BASOS PCT: 0 %
Eosinophils Absolute: 0 10*3/uL (ref 0.0–0.7)
Eosinophils Relative: 0 %
HEMATOCRIT: 34.3 % — AB (ref 36.0–46.0)
HEMOGLOBIN: 11.4 g/dL — AB (ref 12.0–15.0)
LYMPHS PCT: 12 %
Lymphs Abs: 0.9 10*3/uL (ref 0.7–4.0)
MCH: 28.9 pg (ref 26.0–34.0)
MCHC: 33.2 g/dL (ref 30.0–36.0)
MCV: 87.1 fL (ref 78.0–100.0)
Monocytes Absolute: 0.5 10*3/uL (ref 0.1–1.0)
Monocytes Relative: 7 %
NEUTROS ABS: 6 10*3/uL (ref 1.7–7.7)
NEUTROS PCT: 80 %
Platelets: 101 10*3/uL — ABNORMAL LOW (ref 150–400)
RBC: 3.94 MIL/uL (ref 3.87–5.11)
RDW: 14.1 % (ref 11.5–15.5)
WBC: 7.6 10*3/uL (ref 4.0–10.5)

## 2015-04-27 LAB — TSH: TSH: 1.612 u[IU]/mL (ref 0.350–4.500)

## 2015-04-27 LAB — LEGIONELLA ANTIGEN, URINE

## 2015-04-27 LAB — FIBRINOGEN: Fibrinogen: 679 mg/dL — ABNORMAL HIGH (ref 204–475)

## 2015-04-27 LAB — HIV ANTIBODY (ROUTINE TESTING W REFLEX): HIV SCREEN 4TH GENERATION: NONREACTIVE

## 2015-04-27 LAB — TROPONIN I: Troponin I: 1.21 ng/mL (ref <0.031)

## 2015-04-27 MED ORDER — SODIUM CHLORIDE 0.9 % IV SOLN
INTRAVENOUS | Status: DC
  Administered 2015-04-28 – 2015-04-30 (×3): via INTRAVENOUS
  Filled 2015-04-27 (×9): qty 1000

## 2015-04-27 MED ORDER — ACETAMINOPHEN 650 MG RE SUPP: 650.0000 mg | Freq: Four times a day (QID) | RECTAL | Status: DC | PRN

## 2015-04-27 MED ORDER — CLOPIDOGREL BISULFATE 75 MG PO TABS
75.0000 mg | ORAL_TABLET | Freq: Every day | ORAL | Status: DC
  Administered 2015-04-27 – 2015-04-30 (×4): 75 mg via ORAL
  Filled 2015-04-27 (×4): qty 1

## 2015-04-27 MED ORDER — IOHEXOL 300 MG/ML  SOLN
25.0000 mL | INTRAMUSCULAR | Status: AC
  Administered 2015-04-27: 25 mL via ORAL

## 2015-04-27 MED ORDER — IOHEXOL 300 MG/ML  SOLN
100.0000 mL | Freq: Once | INTRAMUSCULAR | Status: AC | PRN
  Administered 2015-04-27: 100 mL via INTRAVENOUS

## 2015-04-27 MED ORDER — ACETAMINOPHEN 325 MG PO TABS
650.0000 mg | ORAL_TABLET | Freq: Four times a day (QID) | ORAL | Status: DC | PRN
  Administered 2015-04-27 (×2): 650 mg via ORAL
  Filled 2015-04-27 (×2): qty 2

## 2015-04-27 NOTE — Progress Notes (Signed)
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 Apparently, the patient has also been having fevers and chills for 24 hours prior to admission. She also without emesis. The patient was found to be hypoxemic by EMS with oxygen saturations in the 60s. However in the emergency department, the patient was closely stable on 2 L nasal cannula. Because of elevated d-dimer, CT scan angiogram of chest was performed. It was negative for coronary embolus but showed perihilar patchy ground glass opacities right greater than left. In addition, the patient was found to be febrile up to 100.63F with tachycardia and hypotension with systolic blood pressure in 80's. She was given a 2 L bolus and then continued on maintenance fluids. Assessment/Plan: Sepsis -Present at time of admission -secondary to bacteremia -Appears from urinary source and less likely pulmonary source -CT angio chest negative for pulmonary embolus but showed perihilar patchy ground glass opacities -Continue intravenous fluids -Lactic acid has improved with fluid resuscitation 2.55--> 1.88  -as the patient is having abdominal pain, CT abdomen and pelvis -UA with TNTC WBC - continue intravenous antibiotics pending culture data  -PCT 3.22 Acute respiratory failure with hypoxia/Pulmonary infiltrates -Respiratory virus panel -Influenza negative -Presently stable on 3 L nasal cannula with oxygen saturation 95-97  percent -04/26/2015 CT angiogram chest negative for PE but shows right greater than left patchy perihilar ground glass opacities Bacteremia -GNR--prelim= E.Coli -d/c vancomycin -continue zosyn Elevated troponins -This is likely due to demand ischemia in the setting of sepsis, but agents family requested cardiology evaluation -EKG with nonspecific ST changes, sinus tachycardia -Continue aspirin -04/26/15 echo- EF 60-65%, AK of basalinferior myocardium Thrombocytopenia -This has been chronic dating back to 06/08/2011, but acute worsening secondary to her sepsis -check fibrinogen -check B12 Pyuria -continue abx pending culture data Coronary artery disease with history of CABG -cath 2013 with occlusion of SVG-RPDA-->recommended medical management -Restart lower dose metoprolol tartrate  -Restart Plavix  Hypertension  -Amlodipine, valsartan discontinued secondary to hypotension  Peripheral vascular disease  -History of carotid-subclavian bypass 12/15/2011, and fem-fem bypass -continue plavix Celiac artery stenosis -incidental finding onf CTA of chest -will need close followup as pt has several month hx of postprandial abd pain -SMA is widely patent Hyperlipidemia  -Continue statin  OSA -continue CPAP   Family Communication:   Daughter update on phone  Disposition Plan:   Home when medically stable       Procedures/Studies: Ct Angio Chest Pe W/cm &/or Wo Cm  04/26/2015   CLINICAL DATA:  Shortness of breath. Respiratory distress, onset this morning.  EXAM: CT ANGIOGRAPHY CHEST WITH CONTRAST  TECHNIQUE: Multidetector CT imaging of the chest was performed using the standard protocol during bolus administration of intravenous contrast. Multiplanar CT image reconstructions and MIPs were obtained to evaluate the vascular anatomy.  CONTRAST:  166mL OMNIPAQUE IOHEXOL 350 MG/ML SOLN  COMPARISON:  Radiograph earlier this day.  Chest CT 06/09/2011  FINDINGS: There are no filling defects  within the pulmonary arteries to suggest pulmonary embolus.  Diffuse calcified and noncalcified atheromatous plaque  throughout the thoracic aorta and its branches. A left carotid subclavian bypass graft is seen. Post CABG with atherosclerosis of the native coronary arteries. The heart is normal in size. There is no pleural or pericardial effusion. No mediastinal or hilar adenopathy.  Dependent atelectasis in both lower lobes. Patchy ground-glass opacities in the perihilar lungs, right greater than left. There is smooth septal thickening involving the lower lobes. No pulmonary nodule or suspicious mass.  Evaluation of the upper abdomen demonstrates distended gallbladder. 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  There are no acute or suspicious osseous abnormalities. Multilevel degenerative change throughout the spine with diffuse disc space narrowing endplate irregularity.  Review of the MIP images confirms the above findings.  IMPRESSION: 1. No pulmonary embolus. 2. Perihilar ground-glass opacities, suspicious for pulmonary edema. 3. Diffuse atherosclerosis of the aorta and its branches. 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   Electronically Signed   By: Jeb Levering M.D.   On: 04/26/2015 05:43   Dg Chest Port 1 View  04/27/2015   CLINICAL DATA:  subsequent encounter pneumonitis, short of breath today  EXAM: PORTABLE CHEST - 1 VIEW  COMPARISON:  04/26/2015  FINDINGS: Status post CABG. Increased left perihilar infiltrate. Right lung radiographically clear. No pleural effusions.  IMPRESSION: Given the appearance of bilateral ground-glass infiltrates on CT scan performed yesterday the appearance of increased left perihilar opacity suggests asymmetric edema. Developing pneumonia or pneumonitis not excluded.   Electronically Signed   By: Skipper Cliche M.D.   On: 04/27/2015 07:56   Dg Chest Port 1 View  04/26/2015    CLINICAL DATA:  Headache and not feeling well today.  EXAM: PORTABLE CHEST - 1 VIEW  COMPARISON:  04/26/2015.  FINDINGS: The cardiac silhouette, mediastinal and hilar contours are within normal limits stable. Chronic lung changes but no acute pulmonary findings. No pleural effusion. The bony thorax is stable.  IMPRESSION: No acute cardiopulmonary findings.  Chronic lung changes.   Electronically Signed   By: Marijo Sanes M.D.   On: 04/26/2015 12:25   Dg Chest Port 1 View  04/26/2015   CLINICAL DATA:  75 year old female with shortness of breath and fever  EXAM: PORTABLE CHEST - 1 VIEW  COMPARISON:  Chest radiograph dated 12/19/2011  FINDINGS: Single-view of the chest demonstrates clear lungs. No pleural effusion or pneumothorax. Stable cardiac silhouette. Median sternotomy wires and CABG clips. Degenerative changes of the shoulders. No acute fracture.  IMPRESSION: No active disease.   Electronically Signed   By: Anner Crete M.D.   On: 04/26/2015 02:05   Dg Abd Portable 1v  04/26/2015   CLINICAL DATA:  Nausea, vomiting generalized abdominal pain.  EXAM: PORTABLE ABDOMEN - 1 VIEW  COMPARISON:  None.  FINDINGS: The bowel gas pattern is unremarkable. There is moderate stool in the colon. No findings for small bowel obstruction or free air. Contrast noted in the kidneys and bladder from recent chest CT.  IMPRESSION: No plain film findings for an acute abdominal process.   Electronically Signed   By: Marijo Sanes M.D.   On: 04/26/2015 08:51         Subjective: Patient denies fevers, chills, headache, chest pain, dyspnea, vomiting, diarrhea,  Hematuria.  Complains of abdominal pain bilateral upper quadrants left greater than right and dysuria. C/o nausea without emesis.  No hematochezia or melena.   Objective: Filed Vitals:   04/27/15 0500 04/27/15 0722 04/27/15  [id number] 04/27/15 1130  BP: 132/43 103/66  151/47  Pulse: 115 105  112  Temp:  99.9 F (37.7 C) 99 F (37.2 C) 99.6 F (37.6 C)   TempSrc:  Oral Oral Oral  Resp: 20 22  20   Height:      Weight:      SpO2: 100% 100%  94%    Intake/Output Summary (Last 24 hours) at 04/27/15 1207 Last data filed at 04/27/15 0500  Gross per 24 hour  Intake 2964.17 ml  Output    480 ml  Net 2484.17 ml   Weight change:  Exam:   General:  Pt is alert, follows commands appropriately, not in acute distress  HEENT: No icterus, No thrush, No neck mass, [place]/AT; no meningismus   Cardiovascular: RRR, S1/S2, no rubs, no gallops  Respiratory: bilateral crackles, right greater left. No wheeze   Abdomen: Soft/+BS, left upper quadrant tenderness without any guarding or rebound, non distended, no guarding; no hepatosplenomegaly   Extremities: No edema, No lymphangitis, No petechiae, No rashes, no synovitis; no cyanosis or clubbing   Data Reviewed: Basic Metabolic Panel:  Recent Labs Lab 04/26/15 0056 04/27/15 0310  NA 136 134*  K 4.1 3.7  CL 101 105  CO2 24 23  GLUCOSE 178* 122*  BUN 19 14  CREATININE 1.00 1.00  CALCIUM 9.0 7.8*   Liver Function Tests:  Recent Labs Lab 04/26/15 0056 04/27/15 0310  AST 27 25  ALT 20 15  ALKPHOS 102 56  BILITOT 1.4* 1.6*  PROT 6.8 5.3*  ALBUMIN 3.8 2.7*   No results for input(s): LIPASE, AMYLASE in the last 168 hours. No results for input(s): AMMONIA in the last 168 hours. CBC:  Recent Labs Lab 04/26/15 0056 04/27/15 0310  WBC 8.8 7.6  NEUTROABS 7.6 6.0  HGB 14.9 11.4*  HCT 43.2 34.3*  MCV 84.4 87.1  PLT 136* 101*   Cardiac Enzymes:  Recent Labs Lab 04/26/15 0855 04/26/15 1540 04/26/15 2040 04/27/15 0310  CKTOTAL 120  --   --   --   TROPONINI  --  0.85* 0.72* 1.21*   BNP: Invalid input(s): POCBNP CBG: No results for input(s): GLUCAP in the last 168 hours.  Recent Results (from the past 240 hour(s))  Culture, blood (routine x 2)     Status: None (Preliminary result)   Collection Time: 04/26/15  1:30 AM  Result Value Ref Range Status   Specimen Description  BLOOD RIGHT HAND  Final   Special Requests BOTTLES DRAWN AEROBIC AND ANAEROBIC 3CC  Final   Culture  Setup Time   Final    GRAM NEGATIVE RODS ANAEROBIC BOTTLE ONLY CRITICAL RESULT CALLED TO, READ BACK BY AND VERIFIED WITH: Wayne Both AT 0753 ON [id number] BY Rhea Bleacher    Culture PENDING  Incomplete   Report Status PENDING  Incomplete  Culture, blood (routine x 2)     Status: None (Preliminary result)   Collection Time: 04/26/15  1:43 AM  Result Value Ref Range Status   Specimen Description BLOOD LEFT HAND  Final   Special Requests BOTTLES DRAWN AEROBIC AND ANAEROBIC 4CC  Final   Culture  Setup Time   Final    GRAM NEGATIVE RODS AEROBIC BOTTLE ONLY CRITICAL RESULT CALLED TO, READ BACK BY AND VERIFIED WITH: C.WOLF,RN 04/26/15 @ 1755 BY V.WILKINS CONFIRMED BY K.WOOLLEN    Culture ESCHERICHIA COLI  Final   Report Status PENDING  Incomplete  MRSA PCR Screening     Status: None   Collection  Time: 04/26/15  1:54 PM  Result Value Ref Range Status   MRSA by PCR NEGATIVE NEGATIVE Final    Comment:        The GeneXpert MRSA Assay (FDA approved for NASAL specimens only), is one component of a comprehensive MRSA colonization surveillance program. It is not intended to diagnose MRSA infection nor to guide or monitor treatment for MRSA infections.      Scheduled Meds: . antiseptic oral rinse  7 mL Mouth Rinse BID  . aspirin EC  81 mg Oral Daily  . docusate sodium  100 mg Oral BID  . enoxaparin (LOVENOX) injection  40 mg Subcutaneous Q24H  . famotidine  20 mg Oral Daily  . Influenza vac split quadrivalent PF  0.5 mL Intramuscular Tomorrow-1000  . levothyroxine  200 mcg Oral QAC breakfast  . metoprolol tartrate  12.5 mg Oral BID  . pantoprazole  40 mg Oral Daily  . piperacillin-tazobactam (ZOSYN)  IV  3.375 g Intravenous 3 times per day  . pneumococcal 23 valent vaccine  0.5 mL Intramuscular Tomorrow-1000  . polyethylene glycol  17 g Oral Daily  . rosuvastatin  10 mg Oral Daily  .  vancomycin  750 mg Intravenous Q12H   Continuous Infusions: . sodium chloride 100 mL (04/27/15 0425)     TAT, DAVID, DO  [facility] Pager [phone]  If 7PM-7AM, please contact night-coverage [email or web address] Password [email or web address] 04/27/2015, 12:07 PM   LOS: 1 day

## 2015-04-27 NOTE — Care Management Note (Signed)
Case Management Note  Patient Details  Name: Deanna [name] MRN: [id number] Date of Birth: 1941-01-19  Subjective/Objective:     Adm w sepsis               Action/Plan: lives w fam, pcp dr Leanna Battles   Expected Discharge Date:                  Expected Discharge Plan:  [place]  In-House Referral:     Discharge planning Services     Post Acute Care Choice:    Choice offered to:     DME Arranged:    DME Agency:     HH Arranged:    [place] Agency:     Status of Service:     Medicare Important Message Given:    Date Medicare IM Given:    Medicare IM give by:    Date Additional Medicare IM Given:    Additional Medicare Important Message give by:     If discussed at [place] of Stay Meetings, dates discussed:    Additional Comments: ur review done  Lacretia Leigh, RN 04/27/2015, 8:49 AM

## 2015-04-27 NOTE — Progress Notes (Signed)
Second set of blood cultures positive for gram negative rods. Text page sent to Dr Tat. No new orders at this time. Will continue to monitor.

## 2015-04-27 NOTE — Consult Note (Signed)
CARDIOLOGY CONSULT NOTE   Patient ID: JIAN LEMARR MRN: [id number], DOB/AGE: Mar 06, 1941   Admit date: 04/26/2015 Date of Consult: 04/27/2015   Primary Physician: Donnajean Lopes, MD Primary Cardiologist: Dr. Gwenlyn Found  Pt. 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 Hypertension   . Colon polyps   . Hemorrhoids   . Hypothyroidism   . GERD (gastroesophageal reflux disease)   . Anxiety   . Depression   . Myocardial infarction   . CAD (coronary artery disease)     followed by dr [name].  . Sinus drainage     took z-pack   finished yesterday  . Dysuria   . Atherosclerotic peripheral vascular disease   . Urticaria   . Vertigo, benign positional   . Peripheral arterial disease     Past Surgical History  Procedure Laterality Date  . Fem-fem bypass graft    . Coronary artery bypass graft    . Replacement total knee  05-2011  . Coronary angioplasty    . Carotid-subclavian bypass graft  12/15/2011    Procedure: BYPASS GRAFT CAROTID-SUBCLAVIAN;  Surgeon: Serafina Mitchell, MD;  Location: [facility] OR;  Service: Vascular;  Laterality: Left;  Left Carotid subclavian bypass  . Joint replacement      Left knee  . Unilateral upper extremeity angiogram Left 11/15/2011    Procedure: UNILATERAL UPPER EXTREMEITY ANGIOGRAM;  Surgeon: Lorretta Harp, MD;  Location: [facility] CATH LAB;  Service: Cardiovascular;  Laterality: Left;  . Left heart catheterization with coronary/graft angiogram N/A 12/21/2011    Procedure: LEFT HEART CATHETERIZATION WITH Beatrix Fetters;  Surgeon: Leonie Man, MD;  Location: [facility] CATH LAB;  Service: Cardiovascular;  Laterality: N/A;  . Bilateral upper extremity angiogram N/A 09/11/2012     Procedure: BILATERAL UPPER EXTREMITY ANGIOGRAM;  Surgeon: Serafina Mitchell, MD;  Location: [facility] CATH LAB;  Service: Cardiovascular;  Laterality: N/A;  . Doppler echocardiography  05/27/2010, 09/17/2008    Mild Proximal septal thickening is noted. Left ventricular systolic functions is normal ejection fraction =>55%. the aortic valve appears to be mildly sclerotic   . Nm myocar perf ejection fraction  09/22/2009, 07/03/2007    the post stress myocardial perfusion images show a normal pattern of perfusion is all regions. The post-stress ejection fraction is 68 %. no significant wall motion abnormalities noted. This is a low risk scan.  . 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 patent left common carotid- subclavian artery bypass with turbulent flow noted at the anastomosis with velocities of 295 cm/s  . Holter monitor  01/21/2008    The predominant rhythm was normal sinus rhythm. Minimum heartrate of 63 bpm at +01:00, maximum heartrate of 105 bpm at + 10:35; and the average heartrate of 75 bpm. Ventricular ectopic activity totaled 1270: Multifocal; 866-PVC's and 404-VEs                Allergies  Allergies  Allergen Reactions  . 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 Patient originally had an anterior MI in 1994. A year later, she had PCI of left circumflex. She ultimately underwent CABG 4 in 1999. Last cardiac catheterization in 2013 showed atretic LIMA to LAD, patent SVG to OM, patent SVG to D3 with backflow feeding LAD system, occluded SVG to PDA not amenable to PCI. Medical  therapy was recommended. She was doing well until 3 months ago, since then she has been having intermittent diarrhea, abdominal discomfort, and nausea. She also had significant back pain radiating to bilateral LE.   She presented to [facility] on 04/26/2015 with multiple complaints including fever, chill, locked jaw 2/2 chill, headache and shortness of breath. She complained of burning sensation with urination. Unfortunately, she has history of traumatic brain injury and could not recall majority of the history, history has been provided by her daughter. On arrival to the ED, her heart rate was in the 130s. Her O2 saturation was 92-95% on 2 L oxygen. Of note, in route to [facility], EMS noted her O2 saturation was 60% on room air and improved to 89% on BiPAP. She had a temperature of 100.8. She had diffuse abdominal tenderness. She also complained of right-sided chest pain, however was not able to recall if this is consistent with her prior angina symptom. Surprisingly, her white blood cell count was within normal limits. Lactic acid was elevated at 2.55. D-dimer was elevated at 1.76. 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 UA was positive for nitrite. Given her hypotension, positive UA, she was treated for urosepsis. Blood culture was obtained which came back positive for gram-negative rod and Escherichia coli. Her initial troponin was negative, however subsequent troponin came back +0.49. Serial trop 0.85 --> 0.72 --> 1.21. Cardiology has been consulted for elevated troponin.  Inpatient Medications  . antiseptic oral rinse  7 mL Mouth Rinse BID  . clopidogrel  75 mg Oral Daily  . docusate sodium  100 mg Oral BID  . enoxaparin (LOVENOX) injection  40 mg Subcutaneous Q24H  . famotidine  20 mg Oral Daily  . Influenza  vac split quadrivalent PF  0.5 mL Intramuscular Tomorrow-1000  . iohexol  25 mL Oral Q1 Hr x 2  . levothyroxine  200 mcg Oral QAC breakfast  . metoprolol tartrate  12.5 mg Oral BID  . pantoprazole  40 mg Oral Daily  . piperacillin-tazobactam (ZOSYN)  IV  3.375 g Intravenous 3 times per day  . pneumococcal 23 valent vaccine  0.5 mL Intramuscular Tomorrow-1000  . polyethylene glycol  17 g Oral Daily  . rosuvastatin  10 mg Oral Daily    Family History Family History  Problem Relation Age of Onset  . Colon cancer Brother   . Heart attack Brother   . Hyperlipidemia Brother   . Hypertension Brother   . Heart disease Brother   . Heart attack Mother   . Diabetes Father   . Heart disease Father   . Hypertension Father   . Hyperlipidemia Father   . Heart attack Father   . Diabetes Sister   . Heart disease Sister   . Hyperlipidemia Sister   . Hypertension Sister   . Heart attack Sister  Social History Social History   Social History  . Marital Status: Divorced    Spouse Name: N/A  . Number of Children: N/A  . Years of Education: N/A   Occupational History  . Not on file.   Social History Main Topics  . Smoking status: Former Smoker    Types: Cigarettes    Quit date: 11/27/1981  . Smokeless tobacco: Never Used  . Alcohol Use: 0.0 oz/week    0 Standard drinks or equivalent per week     Comment: Rare  . Drug Use: No  . Sexual Activity: No     Comment: 1st intercourse 63 yo-1 partner   Other Topics Concern  . Not on file   Social History Narrative     Review of Systems  General:  No night sweats or weight changes. + chills, fever Cardiovascular:  No orthopnea, palpitations, paroxysmal nocturnal dyspnea. +R sided chest pain, R leg swelling per daughter, dyspnea Dermatological: No rash, lesions/masses Respiratory: No cough +dyspnea Urologic: No hematuria +dysuria Abdominal:   No nausea, vomiting, bright red blood per rectum, melena, or hematemesis +diffuse  abdominal pain, diarrhea Neurologic:  No visual changes, changes in mental status. +wkns All other systems reviewed and are otherwise negative except as noted above.  Physical Exam  Blood pressure 141/42, pulse 110, temperature 99.6 F (37.6 C), temperature source Oral, resp. rate 21, height 5\' 2"  (1.575 m), weight 190 lb (86.183 kg), SpO2 96 %.  General: Pleasant, NAD Psych: Normal affect. Neuro: Alert and oriented X 3. Moves all extremities spontaneously. HEENT: Normal  Neck: Supple without bruits or JVD. Lungs:  Resp regular and unlabored. Decreased bibasilar breath sound.  Heart: tachycardic. no s3, s4, or murmurs. Abdomen: Soft, non-distended, BS + x 4. +diffusely tender on palpation. Extremities: No clubbing, cyanosis or edema. DP/PT/Radials 2+ and equal bilaterally.  Labs   Recent Labs  04/26/15 0855 04/26/15 1540 04/26/15 2040 04/27/15 0310  CKTOTAL 120  --   --   --   TROPONINI  --  0.85* 0.72* 1.21*   Lab Results  Component Value Date   WBC 7.6 04/27/2015   HGB 11.4* 04/27/2015   HCT 34.3* 04/27/2015   MCV 87.1 04/27/2015   PLT 101* 04/27/2015    Recent Labs Lab 04/27/15 0310  NA 134*  K 3.7  CL 105  CO2 23  BUN 14  CREATININE 1.00  CALCIUM 7.8*  PROT 5.3*  BILITOT 1.6*  ALKPHOS 56  ALT 15  AST 25  GLUCOSE 122*   Lab Results  Component Value Date   CHOL 126 12/20/2011   HDL 30* 12/20/2011   LDLCALC 67 12/20/2011   TRIG 146 12/20/2011   Lab Results  Component Value Date   DDIMER 1.76* 04/26/2015    Radiology/Studies  Ct Angio Chest Pe W/cm &/or Wo Cm  04/26/2015   CLINICAL DATA:  Shortness of breath. Respiratory distress, onset this morning.  EXAM: CT ANGIOGRAPHY CHEST WITH CONTRAST  TECHNIQUE: Multidetector CT imaging of the chest was performed using the standard protocol during bolus administration of intravenous contrast. Multiplanar CT image reconstructions and MIPs were obtained to evaluate the vascular anatomy.  CONTRAST:  165mL  OMNIPAQUE IOHEXOL 350 MG/ML SOLN  COMPARISON:  Radiograph earlier this day.  Chest CT 06/09/2011  FINDINGS: There are no filling defects within the pulmonary arteries to suggest pulmonary embolus.  Diffuse calcified and noncalcified atheromatous plaque throughout the thoracic aorta and its branches. A left carotid subclavian bypass graft is seen. Post CABG with  atherosclerosis of the native coronary arteries. The heart is normal in size. There is no pleural or pericardial effusion. No mediastinal or hilar adenopathy.  Dependent atelectasis in both lower lobes. Patchy ground-glass opacities in the perihilar lungs, right greater than left. There is smooth septal thickening involving the lower lobes. No pulmonary nodule or suspicious mass.  Evaluation of the upper abdomen demonstrates distended gallbladder. 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  There are no acute or suspicious osseous abnormalities. Multilevel degenerative change throughout the spine with diffuse disc space narrowing endplate irregularity.  Review of the MIP images confirms the above findings.  IMPRESSION: 1. No pulmonary embolus. 2. Perihilar ground-glass opacities, suspicious for pulmonary edema. 3. Diffuse atherosclerosis of the aorta and its branches. 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   Electronically Signed   By: Jeb Levering M.D.   On: 04/26/2015 05:43   Dg Chest Port 1 View  04/27/2015   CLINICAL DATA:  subsequent encounter pneumonitis, short of breath today  EXAM: PORTABLE CHEST - 1 VIEW  COMPARISON:  04/26/2015  FINDINGS: Status post CABG. Increased left perihilar infiltrate. Right lung radiographically clear. No pleural effusions.  IMPRESSION: Given the appearance of bilateral ground-glass infiltrates on CT scan performed yesterday the appearance of increased left perihilar opacity suggests asymmetric edema. Developing pneumonia or  pneumonitis not excluded.   Electronically Signed   By: Skipper Cliche M.D.   On: 04/27/2015 07:56   Dg Chest Port 1 View  04/26/2015   CLINICAL DATA:  Headache and not feeling well today.  EXAM: PORTABLE CHEST - 1 VIEW  COMPARISON:  04/26/2015.  FINDINGS: The cardiac silhouette, mediastinal and hilar contours are within normal limits stable. Chronic lung changes but no acute pulmonary findings. No pleural effusion. The bony thorax is stable.  IMPRESSION: No acute cardiopulmonary findings.  Chronic lung changes.   Electronically Signed   By: Marijo Sanes M.D.   On: 04/26/2015 12:25   Dg Chest Port 1 View  04/26/2015   CLINICAL DATA:  74 year old female with shortness of breath and fever  EXAM: PORTABLE CHEST - 1 VIEW  COMPARISON:  Chest radiograph dated 12/19/2011  FINDINGS: Single-view of the chest demonstrates clear lungs. No pleural effusion or pneumothorax. Stable cardiac silhouette. Median sternotomy wires and CABG clips. Degenerative changes of the shoulders. No acute fracture.  IMPRESSION: No active disease.   Electronically Signed   By: Anner Crete M.D.   On: 04/26/2015 02:05   Dg Abd Portable 1v  04/26/2015   CLINICAL DATA:  Nausea, vomiting generalized abdominal pain.  EXAM: PORTABLE ABDOMEN - 1 VIEW  COMPARISON:  None.  FINDINGS: The bowel gas pattern is unremarkable. There is moderate stool in the colon. No findings for small bowel obstruction or free air. Contrast noted in the kidneys and bladder from recent chest CT.  IMPRESSION: No plain film findings for an acute abdominal process.   Electronically Signed   By: Marijo Sanes M.D.   On: 04/26/2015 08:51    ECG  Sinus tach without significant ST-T wave changes  ASSESSMENT AND PLAN  1. Elevated trop: likely demand ischemia in the setting urosepsis  - does have occasional R sided CP, however could not recall her prior anginal symptom. Given fever, positive UA, positive blood and her overall clinical presentation, demand ischemia  is likely, however cannot completely r/o significant underlying blockage  - discussed with MD, will obtain inpatient  myoview once patient become more clinically stable  2. Sepsis with tachycardia and hypotension  - hypotension improving, currently restarted on low dose metoprolol, on 50mg  BID metoprolol at home, expect slow uptitration as BP improve  3. CAD  s/p CABGx 4 (LIMA to LAD, SVG to D2, SVG to RCA, SVG to OM) 1999  - last cath 2013 showed atretic LIMA to LAD, patent SVG to OM, patent SVG to D3 with backflow feeding LAD system, occluded SVG to PDA not amenable to PCI  4. HTN 5. HLD 6. PAD s/p fem-fem bypass 7. Hypothyroidism 8. 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 Admitted following episode of HA, fever/chills, dysuria, dyspnea and CP; also with transient hypotension. Urine and blood cultures positive for gm- rods; urine E Coli. ECG with sinus tach, inferior MI, nonspecific ST changes. Troponin .06 to 1.21. Echo shows inferior basal akinesis with overall normal LV function. Elevated troponin most likely ischemia due to sepsis/hypotension superimposed on underlying CAD; would continue therapy for sepsis; once she recovers, would plan inpatient nuclear study for risk stratification. Continue ASA, plavix, metoprolol and statin. Kirk Ruths

## 2015-04-27 NOTE — Progress Notes (Signed)
PT Cancellation Note  Patient Details Name: Deanna [name] MRN: [id number] DOB: 1940/10/27   Cancelled Treatment:    Reason Eval/Treat Not Completed: Medical issues which prohibited therapy (pt with rise in troponin and not medically appropriate at this time)   Melford Aase 04/27/2015, 7:25 AM Elwyn Reach, [place]

## 2015-04-27 NOTE — Progress Notes (Signed)
Dear Doctor: Tat, This patient has been identified as a candidate for PICC for the following reason (s): IV therapy over 48 hours and poor veins/poor circulatory system (CHF, COPD, emphysema, diabetes, steroid use, IV drug abuse, etc.) If you agree, please write an order for the indicated device. For any questions contact the Vascular Access Team at [phone] if no answer, please leave a message.  Thank you for supporting the early vascular access assessment program.

## 2015-04-28 DIAGNOSIS — N12 Tubulo-interstitial nephritis, not specified as acute or chronic: Secondary | ICD-10-CM

## 2015-04-28 DIAGNOSIS — R7881 Bacteremia: Secondary | ICD-10-CM

## 2015-04-28 DIAGNOSIS — I25708 Atherosclerosis of coronary artery bypass graft(s), unspecified, with other forms of angina pectoris: Secondary | ICD-10-CM

## 2015-04-28 DIAGNOSIS — B9620 Unspecified Escherichia coli [E. coli] as the cause of diseases classified elsewhere: Secondary | ICD-10-CM

## 2015-04-28 DIAGNOSIS — A4151 Sepsis due to Escherichia coli [E. coli]: Principal | ICD-10-CM

## 2015-04-28 DIAGNOSIS — I248 Other forms of acute ischemic heart disease: Secondary | ICD-10-CM

## 2015-04-28 DIAGNOSIS — I10 Essential (primary) hypertension: Secondary | ICD-10-CM

## 2015-04-28 HISTORY — DX: Tubulo-interstitial nephritis, not specified as acute or chronic: N12

## 2015-04-28 HISTORY — DX: Unspecified Escherichia coli (E. coli) as the cause of diseases classified elsewhere: B96.20

## 2015-04-28 LAB — CULTURE, BLOOD (ROUTINE X 2)

## 2015-04-28 LAB — COMPREHENSIVE METABOLIC PANEL
ALBUMIN: 2.7 g/dL — AB (ref 3.5–5.0)
ALK PHOS: 62 U/L (ref 38–126)
ALT: 16 U/L (ref 14–54)
AST: 24 U/L (ref 15–41)
Anion gap: 9 (ref 5–15)
BUN: 10 mg/dL (ref 6–20)
CALCIUM: 8.6 mg/dL — AB (ref 8.9–10.3)
CO2: 23 mmol/L (ref 22–32)
CREATININE: 0.9 mg/dL (ref 0.44–1.00)
Chloride: 105 mmol/L (ref 101–111)
GFR calc Af Amer: >60 mL/min (ref >60)
GFR calc non Af Amer: >60 mL/min (ref >60)
GLUCOSE: 124 mg/dL — AB (ref 65–99)
Potassium: 4.3 mmol/L (ref 3.5–5.1)
SODIUM: 137 mmol/L (ref 135–145)
Total Bilirubin: 1.3 mg/dL — ABNORMAL HIGH (ref 0.3–1.2)
Total Protein: 5.9 g/dL — ABNORMAL LOW (ref 6.5–8.1)

## 2015-04-28 LAB — CBC
HCT: 34.5 % — ABNORMAL LOW (ref 36.0–46.0)
HEMOGLOBIN: 11.8 g/dL — AB (ref 12.0–15.0)
MCH: 29 pg (ref 26.0–34.0)
MCHC: 34.2 g/dL (ref 30.0–36.0)
MCV: 84.8 fL (ref 78.0–100.0)
Platelets: 77 10*3/uL — ABNORMAL LOW (ref 150–400)
RBC: 4.07 MIL/uL (ref 3.87–5.11)
RDW: 14.3 % (ref 11.5–15.5)
WBC: 3.8 10*3/uL — AB (ref 4.0–10.5)

## 2015-04-28 LAB — URINE CULTURE

## 2015-04-28 LAB — PROCALCITONIN: PROCALCITONIN: 1 ng/mL

## 2015-04-28 LAB — VITAMIN B12: VITAMIN B 12: 646 pg/mL (ref 180–914)

## 2015-04-28 MED ORDER — INFLUENZA VAC SPLIT QUAD 0.5 ML IM SUSY: 0.5000 mL | PREFILLED_SYRINGE | INTRAMUSCULAR | Status: DC | PRN

## 2015-04-28 MED ORDER — PNEUMOCOCCAL VAC POLYVALENT 25 MCG/0.5ML IJ INJ: 0.5000 mL | INJECTION | INTRAMUSCULAR | Status: DC | PRN

## 2015-04-28 MED ORDER — DEXTROSE 5 % IV SOLN
2.0000 g | INTRAVENOUS | Status: DC
  Administered 2015-04-28: 2 g via INTRAVENOUS
  Filled 2015-04-28 (×3): qty 2

## 2015-04-28 MED ORDER — ASPIRIN 81 MG PO CHEW
81.0000 mg | CHEWABLE_TABLET | Freq: Every day | ORAL | Status: DC
  Administered 2015-04-28 – 2015-04-30 (×3): 81 mg via ORAL
  Filled 2015-04-28 (×3): qty 1

## 2015-04-28 MED ORDER — SODIUM CHLORIDE 0.9 % IV BOLUS (SEPSIS)
500.0000 mL | Freq: Once | INTRAVENOUS | Status: AC
  Administered 2015-04-28: 500 mL via INTRAVENOUS

## 2015-04-28 NOTE — Progress Notes (Addendum)
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 Apparently, the patient has also been having fevers and chills for 24 hours prior to admission. She also without emesis. The patient was found to be hypoxemic by EMS with oxygen saturations in the 60s. However in the emergency department, the patient was closely stable on 2 L nasal cannula. Because of elevated d-dimer, CT scan angiogram of chest was performed. It was negative for coronary embolus but showed perihilar patchy ground glass opacities right greater than left. In addition, the patient was found to be febrile up to 100.92F with tachycardia and hypotension with systolic blood pressure in 80's. She was given a 2 L bolus and then continued on maintenance fluids. Because of the patient's abdominal pain, CT abdomen and pelvis was performed. Assessment/Plan: Sepsis -Present at time of admission -secondary to pyelonephritis and bacteremia -Appears from urinary source and less likely pulmonary source -CT angio chest negative for pulmonary embolus but showed perihilar patchy ground glass opacities -Continue intravenous fluids -Lactic acid has improved with fluid resuscitation 2.55--> 1.88  -as the patient is having abdominal pain, CT abdomen and pelvis -UA with TNTC WBC - continue intravenous antibiotics  -PCT 3.22-->1.00 Acute respiratory failure with hypoxia/Pulmonary infiltrates -Respiratory virus panel -Influenza  negative -3 L nasal cannula-->weaned to RA with oxygen sat 93-95% on RA -04/26/2015 CT angiogram chest negative for PE but shows right greater than left patchy perihilar ground glass opacities Pyelonephritis--EColi -04/27/2015 CT abdomen pelvis--R-perinephric stranding with left heterogenous renal parenchyma with small amount of perinephric fluid -04/28/15--spoke with radiologist-->perineprhic fluid represents inflammatory fluid, not an abscess, not amendable to drainage -Abdominal pain is improving -Discontinue Zosyn  -start ceftriaxone Bacteremia-Ecoli -GNR--prelim= E.Coli -d/c vancomycin -continue zosyn -can ultimately go home on ciprofloxacin when stable -for now narrow abx to ceftriaxone Elevated troponins -This is likely due to demand ischemia in the setting of sepsis -appreciate cardiology-->will need myoview when stable -EKG with nonspecific ST changes, sinus tachycardia -Continue aspirin -04/26/15 echo- EF 60-65%, AK of basalinferior myocardium Thrombocytopenia -This has been chronic dating back to 06/08/2011, but acute worsening secondary to her sepsis -check fibrinogen--679 -check B12--646 Fem-Fem Pseudoanerysm -incidental finding on CT abd/pelvis -9/20-already consulted vascular surgery for opinion  Coronary artery disease with history of CABG -cath 2013 with occlusion of SVG-RPDA-->recommended medical management -Restart lower dose metoprolol tartrate  -Restart Plavix  -continue ASA 81mg  daily Hypertension  -Amlodipine, valsartan discontinued secondary to hypotension  -Continue lower dose metoprolol tartrate  Peripheral vascular disease  -History of carotid-subclavian bypass 12/15/2011, and fem-fem bypass -continue plavix Celiac artery stenosis -incidental finding onf CTA of chest -will need close followup as pt has several month hx of postprandial abd pain -SMA is widely patent Hyperlipidemia  -Continue statin  OSA -continue CPAP   Family  Communication: Daughter update on phone 9/20 Disposition Plan: transfer to telemetry, home in 1-2 days    Procedures/Studies: Ct Angio Chest Pe W/cm &/or Wo Cm  04/26/2015   CLINICAL DATA:  Shortness of breath. Respiratory distress, onset this morning.  EXAM:  CT ANGIOGRAPHY CHEST WITH CONTRAST  TECHNIQUE: Multidetector CT imaging of the chest was performed using the standard protocol during bolus administration of intravenous contrast. Multiplanar CT image reconstructions and MIPs were obtained to evaluate the vascular anatomy.  CONTRAST:  186mL OMNIPAQUE IOHEXOL 350 MG/ML SOLN  COMPARISON:  Radiograph earlier this day.  Chest CT 06/09/2011  FINDINGS: There are no filling defects within the pulmonary arteries to suggest pulmonary embolus.  Diffuse calcified and noncalcified atheromatous plaque throughout the thoracic aorta and its branches. A left carotid subclavian bypass graft is seen. Post CABG with atherosclerosis of the native coronary arteries. The heart is normal in size. There is no pleural or pericardial effusion. No mediastinal or hilar adenopathy.  Dependent atelectasis in both lower lobes. Patchy ground-glass opacities in the perihilar lungs, right greater than left. There is smooth septal thickening involving the lower lobes. No pulmonary nodule or suspicious mass.  Evaluation of the upper abdomen demonstrates distended gallbladder. 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  There are no acute or suspicious osseous abnormalities. Multilevel degenerative change throughout the spine with diffuse disc space narrowing endplate irregularity.  Review of the MIP images confirms the above findings.  IMPRESSION: 1. No pulmonary embolus. 2. Perihilar ground-glass opacities, suspicious for pulmonary edema. 3. Diffuse atherosclerosis of the aorta and its branches. 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   Electronically  Signed   By: Jeb Levering M.D.   On: 04/26/2015 05:43   Ct Abdomen Pelvis W Contrast  04/28/2015   CLINICAL DATA:  Sepsis, abdominal pain, fever  EXAM: CT ABDOMEN AND PELVIS WITH CONTRAST  TECHNIQUE: Multidetector CT imaging of the abdomen and pelvis was performed using the standard protocol following bolus administration of intravenous contrast.  CONTRAST:  165mL OMNIPAQUE IOHEXOL 300 MG/ML  SOLN  COMPARISON:  None.  FINDINGS: Lower chest: Trace bilateral pleural effusions. Associated dependent atelectasis in the bilateral lower lobes.  Hepatobiliary: Liver is within normal limits.  Mildly distended gallbladder, but without associated inflammatory changes.  No intrahepatic or extrahepatic ductal dilatation.  Pancreas: Within normal limits. Trace fluid inferior to the pancreatic tail is favored to be associated with the left kidney (series 8/image 8).  Spleen: Within normal limits.  Adrenals/Urinary Tract: Adrenal glands within normal limits.  Right kidney is notable for a 2.5 cm lateral right lower pole renal cyst (series 8/ image 21) with associated nonspecific perinephric fluid inferiorly (series 8/ image 24). No hydronephrosis.  Heterogeneous perfusion of the left kidney (series 3/ images 41, 45, and 50), nonspecific but suspicious for pyelonephritis. Associated perinephric fluid anterior to the left upper pole (series 3/ image 36). No hydronephrosis.  Bladder is mildly thick-walled although underdistended.  Stomach/Bowel: Stomach is notable for a tiny hiatal hernia.  No evidence of bowel obstruction.  Normal appendix.  Mild sigmoid diverticulosis, without evidence of diverticulitis.  Vascular/Lymphatic: Atherosclerotic calcifications of the abdominal aorta and branch vessels.  Bifemoral bypass with associated 2.2 cm pseudoaneurysm in the midline anterior abdominal wall (series 3/image 90).  No suspicious abdominopelvic lymphadenopathy.  Reproductive: Uterus is mildly heterogeneous with dystrophic  calcification along the anterior uterine body (series 3/image 35).  Bilateral ovaries are within normal limits.  Other: No abdominopelvic ascites.  Musculoskeletal: Degenerative changes of the visualized thoracolumbar spine.  IMPRESSION: Heterogeneous perfusion of the left kidney, suspicious for pyelonephritis.  Bladder is mildly thick-walled although underdistended. Correlate for cystitis.  Additional ancillary findings as above.  Electronically Signed   By: Julian Hy M.D.   On: 04/28/2015 03:41   Dg Chest Port 1 View  04/27/2015   CLINICAL DATA:  subsequent encounter pneumonitis, short of breath today  EXAM: PORTABLE CHEST - 1 VIEW  COMPARISON:  04/26/2015  FINDINGS: Status post CABG. Increased left perihilar infiltrate. Right lung radiographically clear. No pleural effusions.  IMPRESSION: Given the appearance of bilateral ground-glass infiltrates on CT scan performed yesterday the appearance of increased left perihilar opacity suggests asymmetric edema. Developing pneumonia or pneumonitis not excluded.   Electronically Signed   By: Skipper Cliche M.D.   On: 04/27/2015 07:56   Dg Chest Port 1 View  04/26/2015   CLINICAL DATA:  Headache and not feeling well today.  EXAM: PORTABLE CHEST - 1 VIEW  COMPARISON:  04/26/2015.  FINDINGS: The cardiac silhouette, mediastinal and hilar contours are within normal limits stable. Chronic lung changes but no acute pulmonary findings. No pleural effusion. The bony thorax is stable.  IMPRESSION: No acute cardiopulmonary findings.  Chronic lung changes.   Electronically Signed   By: Marijo Sanes M.D.   On: 04/26/2015 12:25   Dg Chest Port 1 View  04/26/2015   CLINICAL DATA:  74 year old female with shortness of breath and fever  EXAM: PORTABLE CHEST - 1 VIEW  COMPARISON:  Chest radiograph dated 12/19/2011  FINDINGS: Single-view of the chest demonstrates clear lungs. No pleural effusion or pneumothorax. Stable cardiac silhouette. Median sternotomy wires and CABG  clips. Degenerative changes of the shoulders. No acute fracture.  IMPRESSION: No active disease.   Electronically Signed   By: Anner Crete M.D.   On: 04/26/2015 02:05   Dg Abd Portable 1v  04/26/2015   CLINICAL DATA:  Nausea, vomiting generalized abdominal pain.  EXAM: PORTABLE ABDOMEN - 1 VIEW  COMPARISON:  None.  FINDINGS: The bowel gas pattern is unremarkable. There is moderate stool in the colon. No findings for small bowel obstruction or free air. Contrast noted in the kidneys and bladder from recent chest CT.  IMPRESSION: No plain film findings for an acute abdominal process.   Electronically Signed   By: Marijo Sanes M.D.   On: 04/26/2015 08:51        Subjective: Patient states that she is short of breath but better than yesterday. Denies any chest discomfort, vomiting, diarrhea, hematochezia, melena. She has some dysuria. States abdominal pain has improved. Denies any headache or visual disturbance.   Objective: Filed Vitals:   04/28/15 0800 04/28/15 0915 04/28/15 1128 04/28/15 1618  BP: 129/35 120/56 131/63 102/74  Pulse: 86 91 89   Temp:   97.3 F (36.3 C) 97.6 F (36.4 C)  TempSrc:   Oral Oral  Resp: 28  14 22   Height:      Weight:      SpO2: 98%  92% 94%    Intake/Output Summary (Last 24 hours) at 04/28/15 1902 Last data filed at 04/28/15 1700  Gross per 24 hour  Intake   1970 ml  Output    250 ml  Net   1720 ml   Weight change:  Exam:   General:  Pt is alert, follows commands appropriately, not in acute distress  HEENT: No icterus, No thrush, No neck mass, [place]/AT  Cardiovascular: RRR, S1/S2, no rubs, no gallops  Respiratory: Bibasilar crackles, right greater than left. No wheezing.   Abdomen: Soft/+BS, non tender, non distended, no guarding  Extremities: trace LE edema, No lymphangitis, No petechiae, No rashes, no synovitis  Data Reviewed: Basic Metabolic Panel:  Recent Labs Lab 04/26/15 0056 04/27/15 0310 04/28/15 0936  NA 136 134* 137  K  4.1 3.7 4.3  CL 101 105 105  CO2 24 23 23   GLUCOSE 178* 122* 124*  BUN 19 14 10   CREATININE 1.00 1.00 0.90  CALCIUM 9.0 7.8* 8.6*   Liver Function Tests:  Recent Labs Lab 04/26/15 0056 04/27/15 0310 04/28/15 0936  AST 27 25 24   ALT 20 15 16   ALKPHOS 102 56 62  BILITOT 1.4* 1.6* 1.3*  PROT 6.8 5.3* 5.9*  ALBUMIN 3.8 2.7* 2.7*   No results for input(s): LIPASE, AMYLASE in the last 168 hours. No results for input(s): AMMONIA in the last 168 hours. CBC:  Recent Labs Lab 04/26/15 0056 04/27/15 0310 04/28/15 0936  WBC 8.8 7.6 3.8*  NEUTROABS 7.6 6.0  --   HGB 14.9 11.4* 11.8*  HCT 43.2 34.3* 34.5*  MCV 84.4 87.1 84.8  PLT 136* 101* 77*   Cardiac Enzymes:  Recent Labs Lab 04/26/15 0855 04/26/15 1540 04/26/15 2040 04/27/15 0310  CKTOTAL 120  --   --   --   TROPONINI  --  0.85* 0.72* 1.21*   BNP: Invalid input(s): POCBNP CBG: No results for input(s): GLUCAP in the last 168 hours.  Recent Results (from the past 240 hour(s))  Culture, blood (routine x 2)     Status: None (Preliminary result)   Collection Time: 04/26/15  1:30 AM  Result Value Ref Range Status   Specimen Description BLOOD RIGHT HAND  Final   Special Requests BOTTLES DRAWN AEROBIC AND ANAEROBIC 3CC  Final   Culture  Setup Time   Final    GRAM NEGATIVE RODS IN BOTH AEROBIC AND ANAEROBIC BOTTLES CRITICAL RESULT CALLED TO, READ BACK BY AND VERIFIED WITH: Wayne Both AT [id number] ON [id number] BY Rhea Bleacher    Culture GRAM NEGATIVE RODS  Final   Report Status PENDING  Incomplete  Culture, blood (routine x 2)     Status: None   Collection Time: 04/26/15  1:43 AM  Result Value Ref Range Status   Specimen Description BLOOD LEFT HAND  Final   Special Requests BOTTLES DRAWN AEROBIC AND ANAEROBIC 4CC  Final   Culture  Setup Time   Final    GRAM NEGATIVE RODS AEROBIC BOTTLE ONLY CRITICAL RESULT CALLED TO, READ BACK BY AND VERIFIED WITH: C.WOLF,RN 04/26/15 @ 1755 BY V.WILKINS CONFIRMED BY K.WOOLLEN     Culture ESCHERICHIA COLI  Final   Report Status 04/28/2015 FINAL  Final   Organism ID, Bacteria ESCHERICHIA COLI  Final      Susceptibility   Escherichia coli - MIC*    AMPICILLIN <=2 SENSITIVE Sensitive     CEFAZOLIN <=4 SENSITIVE Sensitive     CEFEPIME <=1 SENSITIVE Sensitive     CEFTAZIDIME <=1 SENSITIVE Sensitive     CEFTRIAXONE <=1 SENSITIVE Sensitive     CIPROFLOXACIN <=0.25 SENSITIVE Sensitive     GENTAMICIN <=1 SENSITIVE Sensitive     IMIPENEM <=0.25 SENSITIVE Sensitive     TRIMETH/SULFA <=20 SENSITIVE Sensitive     AMPICILLIN/SULBACTAM <=2 SENSITIVE Sensitive     PIP/TAZO <=4 SENSITIVE Sensitive     * ESCHERICHIA COLI  Urine culture     Status: None   Collection Time: 04/26/15  8:11 AM  Result Value Ref Range Status   Specimen Description URINE, CATHETERIZED  Final   Special Requests NONE  Final   Culture >=100,000 COLONIES/mL ESCHERICHIA COLI  Final   Report Status  04/28/2015 FINAL  Final   Organism ID, Bacteria ESCHERICHIA COLI  Final      Susceptibility   Escherichia coli - MIC*    AMPICILLIN 4 SENSITIVE Sensitive     CEFAZOLIN <=4 SENSITIVE Sensitive     CEFTRIAXONE <=1 SENSITIVE Sensitive     CIPROFLOXACIN <=0.25 SENSITIVE Sensitive     GENTAMICIN <=1 SENSITIVE Sensitive     IMIPENEM <=0.25 SENSITIVE Sensitive     NITROFURANTOIN <=16 SENSITIVE Sensitive     TRIMETH/SULFA <=20 SENSITIVE Sensitive     AMPICILLIN/SULBACTAM <=2 SENSITIVE Sensitive     PIP/TAZO <=4 SENSITIVE Sensitive     * >=100,000 COLONIES/mL ESCHERICHIA COLI  MRSA PCR Screening     Status: None   Collection Time: 04/26/15  1:54 PM  Result Value Ref Range Status   MRSA by PCR NEGATIVE NEGATIVE Final    Comment:        The GeneXpert MRSA Assay (FDA approved for NASAL specimens only), is one component of a comprehensive MRSA colonization surveillance program. It is not intended to diagnose MRSA infection nor to guide or monitor treatment for MRSA infections.      Scheduled Meds: .  antiseptic oral rinse  7 mL Mouth Rinse BID  . aspirin  81 mg Oral Daily  . cefTRIAXone (ROCEPHIN)  IV  2 g Intravenous Q24H  . clopidogrel  75 mg Oral Daily  . docusate sodium  100 mg Oral BID  . enoxaparin (LOVENOX) injection  40 mg Subcutaneous Q24H  . levothyroxine  200 mcg Oral QAC breakfast  . metoprolol tartrate  12.5 mg Oral BID  . pantoprazole  40 mg Oral Daily  . polyethylene glycol  17 g Oral Daily  . rosuvastatin  10 mg Oral Daily   Continuous Infusions: . sodium chloride 0.9 % 1,000 mL with potassium chloride 20 mEq infusion 75 mL/hr at 04/28/15 0845     TAT, DAVID, DO  [facility] Pager [phone]  If 7PM-7AM, please contact night-coverage [email or web address] Password [email or web address] 04/28/2015, 7:02 PM   LOS: 2 days

## 2015-04-28 NOTE — Progress Notes (Signed)
Patient Name: RAYLIN WAITKUS Date of Encounter: 04/28/2015  Principal Problem:   Sepsis Active Problems:   Hypothyroidism   ANXIETY DEPRESSION   Essential hypertension   GERD   Hyperlipidemia   Acute respiratory failure with hypoxia   Pneumonitis   Metabolic acidosis   Thrombocytopenia   Hypotension   Dysuria   OSA on CPAP   Celiac artery stenosis   LBP (low back pain)   Physical deconditioning   Sepsis with hypotension   Bacteremia, escherichia coli   Elevated troponin   Length of Stay: 2  SUBJECTIVE  Feels improved. Has back discomfort  CURRENT MEDS . antiseptic oral rinse  7 mL Mouth Rinse BID  . clopidogrel  75 mg Oral Daily  . docusate sodium  100 mg Oral BID  . enoxaparin (LOVENOX) injection  40 mg Subcutaneous Q24H  . famotidine  20 mg Oral Daily  . levothyroxine  200 mcg Oral QAC breakfast  . metoprolol tartrate  12.5 mg Oral BID  . pantoprazole  40 mg Oral Daily  . piperacillin-tazobactam (ZOSYN)  IV  3.375 g Intravenous 3 times per day  . polyethylene glycol  17 g Oral Daily  . rosuvastatin  10 mg Oral Daily    OBJECTIVE   Intake/Output Summary (Last 24 hours) at 04/28/15 1205 Last data filed at 04/28/15 1000  Gross per 24 hour  Intake 1984.17 ml  Output    500 ml  Net 1484.17 ml   Filed Weights   04/26/15 0047  Weight: 190 lb (86.183 kg)    PHYSICAL EXAM Filed Vitals:   04/28/15 0747 04/28/15 0800 04/28/15 0915 04/28/15 1128  BP: 131/46 129/35 120/56 131/63  Pulse: 86 86 91 89  Temp: 97.6 F (36.4 C)   97.3 F (36.3 C)  TempSrc: Oral   Oral  Resp: 21 28  14   Height:      Weight:      SpO2: 100% 98%  92%   General: Alert, oriented x3, no distress Head: no evidence of trauma, PERRL, EOMI, no exophtalmos or lid lag, no myxedema, no xanthelasma; normal ears, nose and oropharynx Neck: normal jugular venous pulsations and no hepatojugular reflux; brisk carotid pulses without delay and no carotid bruits Chest: clear to auscultation,  no signs of consolidation by percussion or palpation, normal fremitus, symmetrical and full respiratory excursions, sternotomy scar Cardiovascular: normal position and quality of the apical impulse, regular rhythm, normal first and second heart sounds, no rubs or gallops, no murmur Abdomen: no tenderness or distention, no masses by palpation, no abnormal pulsatility or arterial bruits, normal bowel sounds, no hepatosplenomegaly Extremities: no clubbing, cyanosis or edema; 2+ radial, ulnar and brachial pulses bilaterally; 2+ right femoral, posterior tibial and dorsalis pedis pulses; 2+ left femoral, posterior tibial and dorsalis pedis pulses; no subclavian or femoral bruits Neurological: grossly nonfocal  LABS  CBC  Recent Labs  04/26/15 0056 04/27/15 0310 04/28/15 0936  WBC 8.8 7.6 3.8*  NEUTROABS 7.6 6.0  --   HGB 14.9 11.4* 11.8*  HCT 43.2 34.3* 34.5*  MCV 84.4 87.1 84.8  PLT 136* 101* 77*   Basic Metabolic Panel  Recent Labs  04/27/15 0310 04/28/15 0936  NA 134* 137  K 3.7 4.3  CL 105 105  CO2 23 23  GLUCOSE 122* 124*  BUN 14 10  CREATININE 1.00 0.90  CALCIUM 7.8* 8.6*   Liver Function Tests  Recent Labs  04/27/15 0310 04/28/15 0936  AST 25 24  ALT 15 16  ALKPHOS  56 62  BILITOT 1.6* 1.3*  PROT 5.3* 5.9*  ALBUMIN 2.7* 2.7*   No results for input(s): LIPASE, AMYLASE in the last 72 hours. Cardiac Enzymes  Recent Labs  04/26/15 0855 04/26/15 1540 04/26/15 2040 04/27/15 0310  CKTOTAL 120  --   --   --   TROPONINI  --  0.85* 0.72* 1.21*   BNP Invalid input(s): POCBNP D-Dimer  Recent Labs  04/26/15 0300  DDIMER 1.76*   Hemoglobin A1C No results for input(s): HGBA1C in the last 72 hours. Fasting Lipid Panel No results for input(s): CHOL, HDL, LDLCALC, TRIG, CHOLHDL, LDLDIRECT in the last 72 hours. Thyroid Function Tests  Recent Labs  04/27/15 0310  TSH 1.612    Radiology Studies Imaging results have been reviewed and Ct Abdomen Pelvis W  Contrast  04/28/2015   CLINICAL DATA:  Sepsis, abdominal pain, fever  EXAM: CT ABDOMEN AND PELVIS WITH CONTRAST  TECHNIQUE: Multidetector CT imaging of the abdomen and pelvis was performed using the standard protocol following bolus administration of intravenous contrast.  CONTRAST:  143mL OMNIPAQUE IOHEXOL 300 MG/ML  SOLN  COMPARISON:  None.  FINDINGS: Lower chest: Trace bilateral pleural effusions. Associated dependent atelectasis in the bilateral lower lobes.  Hepatobiliary: Liver is within normal limits.  Mildly distended gallbladder, but without associated inflammatory changes.  No intrahepatic or extrahepatic ductal dilatation.  Pancreas: Within normal limits. Trace fluid inferior to the pancreatic tail is favored to be associated with the left kidney (series 8/image 8).  Spleen: Within normal limits.  Adrenals/Urinary Tract: Adrenal glands within normal limits.  Right kidney is notable for a 2.5 cm lateral right lower pole renal cyst (series 8/ image 21) with associated nonspecific perinephric fluid inferiorly (series 8/ image 24). No hydronephrosis.  Heterogeneous perfusion of the left kidney (series 3/ images 41, 45, and 50), nonspecific but suspicious for pyelonephritis. Associated perinephric fluid anterior to the left upper pole (series 3/ image 36). No hydronephrosis.  Bladder is mildly thick-walled although underdistended.  Stomach/Bowel: Stomach is notable for a tiny hiatal hernia.  No evidence of bowel obstruction.  Normal appendix.  Mild sigmoid diverticulosis, without evidence of diverticulitis.  Vascular/Lymphatic: Atherosclerotic calcifications of the abdominal aorta and branch vessels.  Bifemoral bypass with associated 2.2 cm pseudoaneurysm in the midline anterior abdominal wall (series 3/image 90).  No suspicious abdominopelvic lymphadenopathy.  Reproductive: Uterus is mildly heterogeneous with dystrophic calcification along the anterior uterine body (series 3/image 35).  Bilateral ovaries are  within normal limits.  Other: No abdominopelvic ascites.  Musculoskeletal: Degenerative changes of the visualized thoracolumbar spine.  IMPRESSION: Heterogeneous perfusion of the left kidney, suspicious for pyelonephritis.  Bladder is mildly thick-walled although underdistended. Correlate for cystitis.  Additional ancillary findings as above.   Electronically Signed   By: Julian Hy M.D.   On: 04/28/2015 03:41   Dg Chest Port 1 View  04/27/2015   CLINICAL DATA:  subsequent encounter pneumonitis, short of breath today  EXAM: PORTABLE CHEST - 1 VIEW  COMPARISON:  04/26/2015  FINDINGS: Status post CABG. Increased left perihilar infiltrate. Right lung radiographically clear. No pleural effusions.  IMPRESSION: Given the appearance of bilateral ground-glass infiltrates on CT scan performed yesterday the appearance of increased left perihilar opacity suggests asymmetric edema. Developing pneumonia or pneumonitis not excluded.   Electronically Signed   By: Skipper Cliche M.D.   On: 04/27/2015 07:56    TELE NSR  ECG NSR, minor nonspecific changes  ASSESSMENT AND PLAN   Minor cardiac enzyme release due to  demand ischemia in setting of hypotension and tachycardia due to urosepsis. Plan nuclear myocardial perfusion study once she recovers from the acute illness. With normal LV function and known CAD with known areas of stenosis, this is not necessarily an urgent/inpatient procedure.   Sanda Klein, MD, [facility] [phone] office [phone] pager 04/28/2015 12:05 PM

## 2015-04-28 NOTE — Progress Notes (Signed)
OT Cancellation Note  Patient Details Name: Deanna [name] MRN: [id number] DOB: May 21, 1941   Cancelled Treatment:    Reason Eval/Treat Not Completed: Medical issues which prohibited therapy.(pt awaiting myoview and to hold today per Dr.Tat)  [name] [id number] 04/28/2015, 9:00 AM

## 2015-04-28 NOTE — Progress Notes (Signed)
Pt stated she did not feel well tonight and wishes to only wear [place] O2 while sleeping. I explained RN/RT would monitor and put CPAP on if we felt it was in her best interest. RN aware. RT will monitor.

## 2015-04-28 NOTE — Progress Notes (Signed)
PT Cancellation Note  Patient Details Name: Deanna [name] MRN: [id number] DOB: August 29, 1940   Cancelled Treatment:    Reason Eval/Treat Not Completed: Medical issues which prohibited therapy (pt awaiting myoview and to hold today per Dr.Tat)   [name] 04/28/2015, 8:29 AM Elwyn Reach, [place]

## 2015-04-28 NOTE — Progress Notes (Signed)
Transfer Note:   Arrival Method: Bed Mental Orientation: Alert and orientedx4 Telemetry: Box #1 Assessment: Completed Skin: Intact IV: NS +20 K+ @75 -Rt FA, NSL-LAC Pain: Denies Tubes: N/A Safety Measures: Safety Fall Prevention Plan has been given, discussed. Admission: Completed [facility] Orientation: Patient has been orientated to the room, unit and staff.  Family:  Orders have been reviewed and implemented. Will continue to monitor the patient. Call light has been placed within reach and bed alarm has been activated.   [employer], RN-BC Phone number: [phone]

## 2015-04-28 NOTE — Progress Notes (Signed)
Pt stated she would like to wait for someone to bring her home cpap to hospital instead of wearing hospital cpap. RT informed pt to call for RT if pt changes her mind

## 2015-04-29 DIAGNOSIS — T82898S Other specified complication of vascular prosthetic devices, implants and grafts, sequela: Secondary | ICD-10-CM

## 2015-04-29 LAB — BASIC METABOLIC PANEL
ANION GAP: 7 (ref 5–15)
BUN: 9 mg/dL (ref 6–20)
CHLORIDE: 107 mmol/L (ref 101–111)
CO2: 20 mmol/L — AB (ref 22–32)
Calcium: 8 mg/dL — ABNORMAL LOW (ref 8.9–10.3)
Creatinine, Ser: 0.81 mg/dL (ref 0.44–1.00)
GFR calc non Af Amer: >60 mL/min (ref >60)
Glucose, Bld: 126 mg/dL — ABNORMAL HIGH (ref 65–99)
Potassium: 4.5 mmol/L (ref 3.5–5.1)
SODIUM: 134 mmol/L — AB (ref 135–145)

## 2015-04-29 LAB — CBC
HCT: 31.3 % — ABNORMAL LOW (ref 36.0–46.0)
HEMOGLOBIN: 10.3 g/dL — AB (ref 12.0–15.0)
MCH: 27.8 pg (ref 26.0–34.0)
MCHC: 32.9 g/dL (ref 30.0–36.0)
MCV: 84.6 fL (ref 78.0–100.0)
PLATELETS: 83 10*3/uL — AB (ref 150–400)
RBC: 3.7 MIL/uL — AB (ref 3.87–5.11)
RDW: 14.3 % (ref 11.5–15.5)
WBC: 3.7 10*3/uL — AB (ref 4.0–10.5)

## 2015-04-29 LAB — RESPIRATORY VIRUS PANEL
ADENOVIRUS: NEGATIVE
INFLUENZA B 1: NEGATIVE
Influenza A: NEGATIVE
METAPNEUMOVIRUS: NEGATIVE
PARAINFLUENZA 1 A: NEGATIVE
PARAINFLUENZA 2 A: NEGATIVE
PARAINFLUENZA 3 A: NEGATIVE
Respiratory Syncytial Virus A: NEGATIVE
Respiratory Syncytial Virus B: NEGATIVE
Rhinovirus: NEGATIVE

## 2015-04-29 NOTE — Care Management Important Message (Signed)
Important Message  Patient Details  Name: JUANICE SEELINGER MRN: [id number] Date of Birth: 1941-05-14   Medicare Important Message Given:  Yes-second notification given    RoyalRory Percy, RN 04/29/2015, 11:18 AM

## 2015-04-29 NOTE — Evaluation (Signed)
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 Apparently, the patient has also been having fevers and chills for 24 hours prior to admission. She also without emesis. The patient was found to be hypoxemic by EMS with oxygen saturations in the 60s. However in the emergency department, the patient was closely stable on 2 L nasal cannula. Because of elevated d-dimer, CT scan angiogram of chest was performed. It was negative for coronary embolus but showed perihilar patchy ground glass opacities right greater than left. In addition, the patient was found to be febrile up to 100.18F with tachycardia and hypotension with systolic blood pressure in 80's. She was given a 2 L bolus and then continued on maintenance fluids.  Clinical Impression   Pt admitted with above diagnosis. Pt currently with functional limitations due to the deficits listed below (see PT Problem List).  Pt will benefit from skilled PT to increase their independence and safety with mobility to allow discharge to the venue listed below.       Follow Up Recommendations Home health PT    Equipment Recommendations  Rolling walker with 5" wheels;3in1 (PT)    Recommendations for Other Services       Precautions / Restrictions Precautions Precautions: Fall      Mobility  Bed Mobility Overal bed mobility: Needs Assistance Bed Mobility: Supine to Sit     Supine to sit: Supervision     General bed mobility comments: Cues to initiate moving; used rails  Transfers Overall transfer level: Needs assistance Equipment used: Rolling walker (2 wheeled) Transfers: Sit to/from Stand Sit to Stand: Min assist         General transfer comment: Cues for hand placement and safety  Ambulation/Gait Ambulation/Gait assistance: Min assist;Mod assist Ambulation Distance (Feet): 20 Feet (to/from bathroom) Assistive device: Rolling walker (2 wheeled) Gait Pattern/deviations: Step-through pattern;Decreased stride length;Leaning posteriorly     General Gait Details: Needing mod assist as noted one instance of pt leaning posteriorly enough that RW began to tip backwards; mod assistto regain balance  Stairs            Wheelchair Mobility    Modified Rankin (Stroke Patients Only)       Balance                                             Pertinent Vitals/Pain Pain Assessment: 0-10 Pain Score: 6  Pain Location: back and inferior sternum, right side Pain Descriptors / Indicators: Aching (back pain present for approx 3 months) Pain Intervention(s): Monitored during session    Home Living Family/patient expects to be discharged to:: Private residence Living Arrangements: Children Available Help at Discharge: Family;Available 24 hours/day Type of Home: Apartment Home Access: Level entry     Home Layout: One level Home Equipment: Walker - 2 wheels;Shower seat;Other (comment);Hand held shower head;Adaptive equipment Additional Comments: Pt drives but hasnt been able to recently    Prior Function  Level of Independence: Independent with assistive device(s)               Hand Dominance   Dominant Hand: Right    Extremity/Trunk Assessment   Upper Extremity Assessment: Defer to OT evaluation           Lower Extremity Assessment: Generalized weakness         Communication   Communication: No difficulties  Cognition  Arousal/Alertness: Awake/alert Behavior During Therapy: WFL for tasks assessed/performed Overall Cognitive Status: Within Functional Limits for tasks assessed                      General Comments General comments (skin integrity, edema, etc.): Pt voiced frustration and sadness over her apparent incontinence    Exercises        Assessment/Plan    PT Assessment Patient needs continued PT services  PT Diagnosis Generalized weakness;Difficulty walking   PT Problem List Decreased strength;Decreased activity tolerance;Decreased balance;Decreased mobility;Decreased coordination;Decreased knowledge of use of DME;Pain  PT Treatment Interventions DME instruction;Gait training;Functional mobility training;Therapeutic activities;Therapeutic exercise;Patient/family education   PT Goals (Current goals can be found in the Care Plan section) Acute Rehab PT Goals Patient Stated Goal: did not state PT Goal Formulation: With patient Time For Goal Achievement: 05/13/15 Potential to Achieve Goals: Good    Frequency Min 3X/week   Barriers to discharge        Co-evaluation               End of Session Equipment Utilized During Treatment: Gait belt Activity Tolerance: Patient tolerated treatment well Patient left: in chair;with call bell/phone within reach;Other (comment) (chair alarm pad placed) Nurse Communication: Mobility status;Other (comment) (need for monitor bax for chair alarm)         Time: [id number] PT Time Calculation (min) (ACUTE ONLY): 24 min   Charges:   PT Evaluation $Initial PT Evaluation Tier I: 1 Procedure PT Treatments $Gait Training: 8-22 mins   PT G Codes:        Quin Hoop 04/29/2015, 2:23 PM  Roney Marion, [place] Pager [phone] Office [phone]

## 2015-04-29 NOTE — Progress Notes (Signed)
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 Apparently, the patient has also been having fevers and chills for 24 hours prior to admission. She also without emesis. The patient was found to be hypoxemic by EMS with oxygen saturations in the 60s. However in the emergency department, the patient was closely stable on 2 L nasal cannula. Because of elevated d-dimer, CT scan angiogram of chest was performed. It was negative for coronary embolus but showed perihilar patchy ground glass opacities right greater than left. In addition, the patient was found to be febrile up to 100.53F with tachycardia and hypotension with systolic blood pressure in 80's. She was given a 2 L bolus and then continued on maintenance fluids. Because of the patient's abdominal pain, CT abdomen and pelvis was performed.   Subjective  Patient in bed, denies any headache, mild back pain, no chest pain cough or shortness of breath. No abdominal pain or weakness.   Assessment/Plan:  Sepsis with Escherichia coli bacteremia due to pyelonephritis -Present at time of admission, renal ultrasound noted no signs of obstruction, responded very well to IV antibiotic which will be continued till she is here thereafter transitioned to oral Cipro. Initiate PT. Sepsis is clinically resolved.    Acute respiratory failure with hypoxia/Pulmonary infiltrates -Respiratory virus panel and Influenza negative, could  have been due to fluid overload and mild diastolic acute on chronic CHF EF 60%. Now off of oxygen and symptom free. Continue supportive care. CT angiogram negative for PE.   Elevated troponins with known CAD -This is likely due to demand ischemia in the setting of sepsis, seen by cardiology, echogram noted with EF 60% and some akinesis which likely is chronic. Per cardiology outpatient stress test. Continue the patient on aspirin, Plavix along with beta blocker and statin for secondary prevention.   Thrombocytopenia -This has been chronic dating back to 06/08/2011, but acute worsening secondary to her sepsis, no signs of bleeding, fibrinogen 679, B-12 646. Outpatient CBC checked by PCP in 7-10 days thereafter outpatient workup for thrombocytopenia.   Fem-Fem Pseudoanerysm -Seen by vascular surgery has to follow with Dr. Trula Slade outpatient, continue aspirin.   Hypertension  -Amlodipine, valsartan discontinued secondary to hypotension  -Continue lower dose metoprolol tartrate    Peripheral vascular disease  -History of carotid-subclavian bypass 12/15/2011, and fem-fem bypass -continue plavix   Celiac artery stenosis -incidental finding onf CTA of chest, continue aspirin and Plavix and statin. Outpatient follow-up with Dr. Trula Slade. Seen by vascular surgery here.  Hyperlipidemia  -Continue statin   OSA -continue CPAP daily at bedtime   Family Communication: Previous physician updated Daughter update on phone 9/20 Disposition Plan:Initiate PT discharge soon based on PT evaluation    Procedures/Studies: Ct Angio Chest Pe W/cm &/or Wo Cm  04/26/2015   CLINICAL DATA:  Shortness of breath. Respiratory distress, onset this morning.  EXAM: CT ANGIOGRAPHY CHEST WITH CONTRAST  TECHNIQUE: Multidetector CT imaging of the chest was performed using the standard protocol during bolus administration of intravenous contrast. Multiplanar CT image  reconstructions and MIPs were obtained  to evaluate the vascular anatomy.  CONTRAST:  146mL OMNIPAQUE IOHEXOL 350 MG/ML SOLN  COMPARISON:  Radiograph earlier this day.  Chest CT 06/09/2011  FINDINGS: There are no filling defects within the pulmonary arteries to suggest pulmonary embolus.  Diffuse calcified and noncalcified atheromatous plaque throughout the thoracic aorta and its branches. A left carotid subclavian bypass graft is seen. Post CABG with atherosclerosis of the native coronary arteries. The heart is normal in size. There is no pleural or pericardial effusion. No mediastinal or hilar adenopathy.  Dependent atelectasis in both lower lobes. Patchy ground-glass opacities in the perihilar lungs, right greater than left. There is smooth septal thickening involving the lower lobes. No pulmonary nodule or suspicious mass.  Evaluation of the upper abdomen demonstrates distended gallbladder. 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  There are no acute or suspicious osseous abnormalities. Multilevel degenerative change throughout the spine with diffuse disc space narrowing endplate irregularity.  Review of the MIP images confirms the above findings.  IMPRESSION: 1. No pulmonary embolus. 2. Perihilar ground-glass opacities, suspicious for pulmonary edema. 3. Diffuse atherosclerosis of the aorta and its branches. 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   Electronically Signed   By: Jeb Levering M.D.   On: 04/26/2015 05:43   Ct Abdomen Pelvis W Contrast  04/28/2015   CLINICAL DATA:  Sepsis, abdominal pain, fever  EXAM: CT ABDOMEN AND PELVIS WITH CONTRAST  TECHNIQUE: Multidetector CT imaging of the abdomen and pelvis was performed using the standard protocol following bolus administration of intravenous contrast.  CONTRAST:  150mL OMNIPAQUE IOHEXOL 300 MG/ML  SOLN  COMPARISON:  None.  FINDINGS: Lower chest: Trace bilateral pleural effusions. Associated dependent  atelectasis in the bilateral lower lobes.  Hepatobiliary: Liver is within normal limits.  Mildly distended gallbladder, but without associated inflammatory changes.  No intrahepatic or extrahepatic ductal dilatation.  Pancreas: Within normal limits. Trace fluid inferior to the pancreatic tail is favored to be associated with the left kidney (series 8/image 8).  Spleen: Within normal limits.  Adrenals/Urinary Tract: Adrenal glands within normal limits.  Right kidney is notable for a 2.5 cm lateral right lower pole renal cyst (series 8/ image 21) with associated nonspecific perinephric fluid inferiorly (series 8/ image 24). No hydronephrosis.  Heterogeneous perfusion of the left kidney (series 3/ images 41, 45, and 50), nonspecific but suspicious for pyelonephritis. Associated perinephric fluid anterior to the left upper pole (series 3/ image 36). No hydronephrosis.  Bladder is mildly thick-walled although underdistended.  Stomach/Bowel: Stomach is notable for a tiny hiatal hernia.  No evidence of bowel obstruction.  Normal appendix.  Mild sigmoid diverticulosis, without evidence of diverticulitis.  Vascular/Lymphatic: Atherosclerotic calcifications of the abdominal aorta and branch vessels.  Bifemoral bypass with associated 2.2 cm pseudoaneurysm in the midline anterior abdominal wall (series 3/image 90).  No suspicious abdominopelvic lymphadenopathy.  Reproductive: Uterus is mildly heterogeneous with dystrophic calcification along the anterior uterine body (series 3/image 35).  Bilateral ovaries are within normal limits.  Other: No abdominopelvic ascites.  Musculoskeletal: Degenerative changes of the visualized thoracolumbar spine.  IMPRESSION: Heterogeneous perfusion of the left kidney, suspicious for pyelonephritis.  Bladder is mildly thick-walled although underdistended. Correlate for cystitis.  Additional ancillary findings as above.   Electronically Signed   By: Julian Hy M.D.   On: 04/28/2015 03:41    Dg Chest Port 1 View  04/27/2015  CLINICAL DATA:  subsequent encounter pneumonitis, short of breath today  EXAM: PORTABLE CHEST - 1 VIEW  COMPARISON:  04/26/2015  FINDINGS: Status post CABG. Increased left perihilar infiltrate. Right lung radiographically clear. No pleural effusions.  IMPRESSION: Given the appearance of bilateral ground-glass infiltrates on CT scan performed yesterday the appearance of increased left perihilar opacity suggests asymmetric edema. Developing pneumonia or pneumonitis not excluded.   Electronically Signed   By: Skipper Cliche M.D.   On: 04/27/2015 07:56   Dg Chest Port 1 View  04/26/2015   CLINICAL DATA:  Headache and not feeling well today.  EXAM: PORTABLE CHEST - 1 VIEW  COMPARISON:  04/26/2015.  FINDINGS: The cardiac silhouette, mediastinal and hilar contours are within normal limits stable. Chronic lung changes but no acute pulmonary findings. No pleural effusion. The bony thorax is stable.  IMPRESSION: No acute cardiopulmonary findings.  Chronic lung changes.   Electronically Signed   By: Marijo Sanes M.D.   On: 04/26/2015 12:25   Dg Chest Port 1 View  04/26/2015   CLINICAL DATA:  74 year old female with shortness of breath and fever  EXAM: PORTABLE CHEST - 1 VIEW  COMPARISON:  Chest radiograph dated 12/19/2011  FINDINGS: Single-view of the chest demonstrates clear lungs. No pleural effusion or pneumothorax. Stable cardiac silhouette. Median sternotomy wires and CABG clips. Degenerative changes of the shoulders. No acute fracture.  IMPRESSION: No active disease.   Electronically Signed   By: Anner Crete M.D.   On: 04/26/2015 02:05   Dg Abd Portable 1v  04/26/2015   CLINICAL DATA:  Nausea, vomiting generalized abdominal pain.  EXAM: PORTABLE ABDOMEN - 1 VIEW  COMPARISON:  None.  FINDINGS: The bowel gas pattern is unremarkable. There is moderate stool in the colon. No findings for small bowel obstruction or free air. Contrast noted in the kidneys and bladder from  recent chest CT.  IMPRESSION: No plain film findings for an acute abdominal process.   Electronically Signed   By: Marijo Sanes M.D.   On: 04/26/2015 08:51         Objective: Filed Vitals:   04/28/15 2301 04/29/15 0300 04/29/15 0506 04/29/15 0845  BP:  93/48 128/54 100/55  Pulse:  89 93 87  Temp:   98.7 F (37.1 C) 98.4 F (36.9 C)  TempSrc:   Oral Oral  Resp:   20 20  Height:      Weight:      SpO2: 97%  92% 95%    Intake/Output Summary (Last 24 hours) at 04/29/15 1151 Last data filed at 04/29/15 0900  Gross per 24 hour  Intake   2335 ml  Output    851 ml  Net   1484 ml   Weight change:  Exam:   General:  Pt is alert, follows commands appropriately, not in acute distress  HEENT: No icterus, No thrush, No neck mass, [place]/AT  Cardiovascular: RRR, S1/S2, no rubs, no gallops  Respiratory: Bibasilar crackles, right greater than left. No wheezing.   Abdomen: Soft/+BS, non tender, non distended, no guarding  Extremities: trace LE edema, No lymphangitis, No petechiae, No rashes, no synovitis  Data Reviewed: Basic Metabolic Panel:  Recent Labs Lab 04/26/15 0056 04/27/15 0310 04/28/15 0936 04/29/15 0405  NA 136 134* 137 134*  K 4.1 3.7 4.3 4.5  CL 101 105 105 107  CO2 24 23 23  20*  GLUCOSE 178* 122* 124* 126*  BUN 19 14 10 9   CREATININE 1.00 1.00 0.90 0.81  CALCIUM 9.0  7.8* 8.6* 8.0*   Liver Function Tests:  Recent Labs Lab 04/26/15 0056 04/27/15 0310 04/28/15 0936  AST 27 25 24   ALT 20 15 16   ALKPHOS 102 56 62  BILITOT 1.4* 1.6* 1.3*  PROT 6.8 5.3* 5.9*  ALBUMIN 3.8 2.7* 2.7*   No results for input(s): LIPASE, AMYLASE in the last 168 hours. No results for input(s): AMMONIA in the last 168 hours. CBC:  Recent Labs Lab 04/26/15 0056 04/27/15 0310 04/28/15 0936 04/29/15 0757  WBC 8.8 7.6 3.8* 3.7*  NEUTROABS 7.6 6.0  --   --   HGB 14.9 11.4* 11.8* 10.3*  HCT 43.2 34.3* 34.5* 31.3*  MCV 84.4 87.1 84.8 84.6  PLT 136* 101* 77* 83*    Cardiac Enzymes:  Recent Labs Lab 04/26/15 0855 04/26/15 1540 04/26/15 2040 04/27/15 0310  CKTOTAL 120  --   --   --   TROPONINI  --  0.85* 0.72* 1.21*   BNP: Invalid input(s): POCBNP CBG: No results for input(s): GLUCAP in the last 168 hours.  Recent Results (from the past 240 hour(s))  Culture, blood (routine x 2)     Status: None (Preliminary result)   Collection Time: 04/26/15  1:30 AM  Result Value Ref Range Status   Specimen Description BLOOD RIGHT HAND  Final   Special Requests BOTTLES DRAWN AEROBIC AND ANAEROBIC 3CC  Final   Culture  Setup Time   Final    GRAM NEGATIVE RODS IN BOTH AEROBIC AND ANAEROBIC BOTTLES CRITICAL RESULT CALLED TO, READ BACK BY AND VERIFIED WITH: Wayne Both AT [id number] ON [id number] BY Rhea Bleacher    Culture ESCHERICHIA COLI  Final   Report Status PENDING  Incomplete  Culture, blood (routine x 2)     Status: None   Collection Time: 04/26/15  1:43 AM  Result Value Ref Range Status   Specimen Description BLOOD LEFT HAND  Final   Special Requests BOTTLES DRAWN AEROBIC AND ANAEROBIC 4CC  Final   Culture  Setup Time   Final    GRAM NEGATIVE RODS AEROBIC BOTTLE ONLY CRITICAL RESULT CALLED TO, READ BACK BY AND VERIFIED WITH: C.WOLF,RN 04/26/15 @ 1755 BY V.WILKINS CONFIRMED BY K.WOOLLEN    Culture ESCHERICHIA COLI  Final   Report Status 04/28/2015 FINAL  Final   Organism ID, Bacteria ESCHERICHIA COLI  Final      Susceptibility   Escherichia coli - MIC*    AMPICILLIN <=2 SENSITIVE Sensitive     CEFAZOLIN <=4 SENSITIVE Sensitive     CEFEPIME <=1 SENSITIVE Sensitive     CEFTAZIDIME <=1 SENSITIVE Sensitive     CEFTRIAXONE <=1 SENSITIVE Sensitive     CIPROFLOXACIN <=0.25 SENSITIVE Sensitive     GENTAMICIN <=1 SENSITIVE Sensitive     IMIPENEM <=0.25 SENSITIVE Sensitive     TRIMETH/SULFA <=20 SENSITIVE Sensitive     AMPICILLIN/SULBACTAM <=2 SENSITIVE Sensitive     PIP/TAZO <=4 SENSITIVE Sensitive     * ESCHERICHIA COLI  Urine culture     Status:  None   Collection Time: 04/26/15  8:11 AM  Result Value Ref Range Status   Specimen Description URINE, CATHETERIZED  Final   Special Requests NONE  Final   Culture >=100,000 COLONIES/mL ESCHERICHIA COLI  Final   Report Status 04/28/2015 FINAL  Final   Organism ID, Bacteria ESCHERICHIA COLI  Final      Susceptibility   Escherichia coli - MIC*    AMPICILLIN 4 SENSITIVE Sensitive     CEFAZOLIN <=4 SENSITIVE Sensitive  CEFTRIAXONE <=1 SENSITIVE Sensitive     CIPROFLOXACIN <=0.25 SENSITIVE Sensitive     GENTAMICIN <=1 SENSITIVE Sensitive     IMIPENEM <=0.25 SENSITIVE Sensitive     NITROFURANTOIN <=16 SENSITIVE Sensitive     TRIMETH/SULFA <=20 SENSITIVE Sensitive     AMPICILLIN/SULBACTAM <=2 SENSITIVE Sensitive     PIP/TAZO <=4 SENSITIVE Sensitive     * >=100,000 COLONIES/mL ESCHERICHIA COLI  MRSA PCR Screening     Status: None   Collection Time: 04/26/15  1:54 PM  Result Value Ref Range Status   MRSA by PCR NEGATIVE NEGATIVE Final    Comment:        The GeneXpert MRSA Assay (FDA approved for NASAL specimens only), is one component of a comprehensive MRSA colonization surveillance program. It is not intended to diagnose MRSA infection nor to guide or monitor treatment for MRSA infections.   Respiratory virus panel     Status: None   Collection Time: 04/27/15  2:55 PM  Result Value Ref Range Status   Source - RVPAN NASAL SWAB  Corrected   Respiratory Syncytial Virus A Negative Negative Final   Respiratory Syncytial Virus B Negative Negative Final   Influenza A Negative Negative Final   Influenza B Negative Negative Final   Parainfluenza 1 Negative Negative Final   Parainfluenza 2 Negative Negative Final   Parainfluenza 3 Negative Negative Final   Metapneumovirus Negative Negative Final   Rhinovirus Negative Negative Final   Adenovirus Negative Negative Final    Comment: (NOTE) Performed At: [facility] [place], [place] [id number] Lindon Romp MD [id number]      Scheduled Meds: . antiseptic oral rinse  7 mL Mouth Rinse BID  . aspirin  81 mg Oral Daily  . cefTRIAXone (ROCEPHIN)  IV  2 g Intravenous Q24H  . clopidogrel  75 mg Oral Daily  . docusate sodium  100 mg Oral BID  . enoxaparin (LOVENOX) injection  40 mg Subcutaneous Q24H  . levothyroxine  200 mcg Oral QAC breakfast  . metoprolol tartrate  12.5 mg Oral BID  . pantoprazole  40 mg Oral Daily  . polyethylene glycol  17 g Oral Daily  . rosuvastatin  10 mg Oral Daily   Continuous Infusions: . sodium chloride 0.9 % 1,000 mL with potassium chloride 20 mEq infusion 75 mL/hr at 04/28/15 1942     SINGH,PRASHANT K M.D on 04/29/2015 at 11:51 AM  Between 7am to 7pm - Pager - [phone], After 7pm go to [email or web address] - password [email or web address]  [phone]   LOS: 3 days

## 2015-04-29 NOTE — Consult Note (Signed)
[facility] [place] HISTORY AND PHYSICAL   History of Present Illness:  Patient is a 74 y.o. 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  The bypass was placed in 1999 by Dr Arlyce Dice.  Pseudoaneurysm was noted on duplex scan by my partner Dr Bridgett Larsson on arterial duplex in July of this year.  CT from this admission has similar appearance.  Pt was admitted with urinary tract infection and pseudoaneurysm incidentally seen on CT.  Pt is feeling better.  No pain over graft.  No claudication.  Other medical problems include hypertension, CAD, both of which are stable.  Past Medical History  Diagnosis Date  . Hypertension   . Colon polyps   . Hemorrhoids   . Hypothyroidism   . GERD (gastroesophageal reflux disease)   . Anxiety   . Depression   . Myocardial infarction   . CAD (coronary artery disease)     followed by dr [name].  . Sinus drainage     took z-pack   finished yesterday  . Dysuria   . Atherosclerotic peripheral vascular disease   . Urticaria   . Vertigo, benign positional   . Peripheral arterial disease     Past Surgical History  Procedure Laterality Date  . Fem-fem bypass graft    . Coronary artery bypass graft    . Replacement total knee  05-2011  . Coronary angioplasty    . Carotid-subclavian bypass graft  12/15/2011    Procedure: BYPASS GRAFT CAROTID-SUBCLAVIAN;  Surgeon: Serafina Mitchell, MD;  Location: [facility] OR;  Service: Vascular;  Laterality: Left;  Left Carotid subclavian bypass  . Joint replacement      Left knee  . Unilateral upper extremeity angiogram Left 11/15/2011    Procedure: UNILATERAL UPPER EXTREMEITY ANGIOGRAM;  Surgeon: Lorretta Harp, MD;  Location: [facility] CATH LAB;  Service: Cardiovascular;  Laterality: Left;  . Left heart catheterization with coronary/graft angiogram N/A 12/21/2011    Procedure: LEFT HEART CATHETERIZATION WITH Beatrix Fetters;  Surgeon: Leonie Man, MD;  Location: [facility] CATH LAB;   Service: Cardiovascular;  Laterality: N/A;  . Bilateral upper extremity angiogram N/A 09/11/2012    Procedure: BILATERAL UPPER EXTREMITY ANGIOGRAM;  Surgeon: Serafina Mitchell, MD;  Location: [facility] CATH LAB;  Service: Cardiovascular;  Laterality: N/A;  . Doppler echocardiography  05/27/2010, 09/17/2008    Mild Proximal septal thickening is noted. Left ventricular systolic functions is normal ejection fraction =>55%. the aortic valve appears to be mildly sclerotic   . Nm myocar perf ejection fraction  09/22/2009, 07/03/2007    the post stress myocardial perfusion images show a normal pattern of perfusion is all regions. The post-stress ejection fraction is 68 %. no significant wall motion abnormalities noted. This is a low risk scan.  . 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 patent left common carotid- subclavian artery bypass with turbulent flow noted at the anastomosis with velocities of 295 cm/s  . Holter monitor  01/21/2008    The predominant rhythm was normal sinus rhythm. Minimum heartrate of 63 bpm at +01:00, maximum heartrate of 105 bpm at + 10:35; and the average heartrate of 75 bpm. Ventricular ectopic activity totaled 1270: Multifocal; 866-PVC's and 404-VEs               Social History Social History  Substance Use Topics  . Smoking status: Former Smoker    Types: Cigarettes    Quit date: 11/27/1981  . Smokeless tobacco: Never Used  . Alcohol Use: 0.0 oz/week    0 Standard drinks or equivalent per week     Comment: Rare    Family History Family History  Problem Relation Age of Onset  . Colon cancer Brother   . Heart attack Brother   . Hyperlipidemia Brother   . Hypertension Brother   . Heart disease Brother   . Heart attack Mother   . Diabetes Father   . Heart disease  Father   . Hypertension Father   . Hyperlipidemia Father   . Heart attack Father   . Diabetes Sister   . Heart disease Sister   . Hyperlipidemia Sister   . Hypertension Sister   . Heart attack Sister     Allergies  Allergies  Allergen Reactions  . Vicodin [Hydrocodone-Acetaminophen] Other (See Comments)    unknown     Current Facility-Administered Medications  Medication Dose Route Frequency Provider Last Rate Last Dose  . acetaminophen (TYLENOL) suppository 650 mg  650 mg Rectal Q6H PRN Gardiner Barefoot, NP      . acetaminophen (TYLENOL) tablet 650 mg  650 mg Oral Q6H PRN Dianne Dun, NP   650 mg at 04/27/15 2132  . antiseptic oral rinse (CPC / CETYLPYRIDINIUM CHLORIDE 0.05%) solution 7 mL  7 mL Mouth Rinse BID Ivor Costa, MD   7 mL at 04/28/15 2111  . aspirin chewable tablet 81 mg  81 mg Oral Daily Mihai Croitoru, MD   81 mg at 04/28/15 1316  . cefTRIAXone (ROCEPHIN) 2 g in dextrose 5 % 50 mL IVPB  2 g Intravenous Q24H Orson Eva, MD   2 g at 04/28/15 1800  . clopidogrel (PLAVIX) tablet 75 mg  75 mg Oral Daily Orson Eva, MD   75 mg at 04/28/15 0915  . docusate sodium (COLACE) capsule 100 mg  100 mg Oral BID Samella Parr, NP   100 mg at 04/28/15 2110  . enoxaparin (LOVENOX) injection 40 mg  40 mg Subcutaneous Q24H Samella Parr, NP   40 mg at 04/28/15 1316  . Influenza vac split quadrivalent PF (FLUARIX) injection 0.5 mL  0.5 mL Intramuscular Prior to discharge Orson Eva, MD      . levothyroxine (SYNTHROID, LEVOTHROID) tablet 200 mcg  200 mcg Oral QAC breakfast Samella Parr, NP   200 mcg at 04/29/15 [id number]  . LORazepam (ATIVAN) tablet 0.5 mg  0.5 mg Oral Q6H PRN Samella Parr, NP   0.5 mg at 04/28/15 2254  . metoprolol tartrate (LOPRESSOR) tablet 12.5 mg  12.5 mg Oral BID Samella Parr, NP   12.5 mg at 04/28/15 0915  . ondansetron (ZOFRAN) injection 4 mg  4 mg Intravenous Q6H PRN Dianne Dun, NP   4 mg at 04/29/15 0811  . pantoprazole (PROTONIX)  EC tablet 40 mg  40 mg Oral Daily Samella Parr, NP   40 mg at 04/28/15 0915  . pneumococcal 23 valent vaccine (PNU-IMMUNE) injection 0.5 mL  0.5 mL Intramuscular Prior to discharge Shanon Brow Tat, MD      . polyethylene glycol (MIRALAX / GLYCOLAX) packet 17 g  17 g Oral Daily Samella Parr, NP   17 g at 04/28/15 0916  . rosuvastatin (CRESTOR) tablet 10 mg  10 mg Oral Daily Samella Parr, NP   10 mg at 04/28/15 0916  .  sodium chloride 0.9 % 1,000 mL with potassium chloride 20 mEq infusion   Intravenous Continuous Orson Eva, MD 75 mL/hr at 04/28/15 1942    . traMADol (ULTRAM) tablet 50 mg  50 mg Oral Q12H PRN Samella Parr, NP   50 mg at 04/27/15 1242    ROS:   General:  No weight loss, Fever resolved, chills  HEENT: No recent headaches, no nasal bleeding, no visual changes, no sore throat  Neurologic: No dizziness, blackouts, seizures. No recent symptoms of stroke or mini- stroke. No recent episodes of slurred speech, or temporary blindness.  Cardiac: No recent episodes of chest pain/pressure, no shortness of breath at rest.  No shortness of breath with exertion.  Denies history of atrial fibrillation or irregular heartbeat  Vascular: No history of rest pain in feet.  No history of claudication.  No history of non-healing ulcer, No history of DVT   Pulmonary: No home oxygen, no productive cough, no hemoptysis,  No asthma or wheezing  Musculoskeletal:  [ ]  Arthritis, [x ] Low back pain,  [ ]  Joint pain  Hematologic:No history of hypercoagulable state.  No history of easy bleeding.  No history of anemia  Gastrointestinal: No hematochezia or melena,  No gastroesophageal reflux, no trouble swallowing  Urinary: [ ]  chronic Kidney disease, [ ]  on HD - [ ]  MWF or [ ]  TTHS, [ ]  Burning with urination, [ ]  Frequent urination, [ ]  Difficulty urinating;   Skin: No rashes  Psychological: No history of anxiety,  No history of depression   Physical Examination  Filed Vitals:   04/28/15  2200 04/28/15 2301 04/29/15 0300 04/29/15 0506  BP: 98/57  93/48 128/54  Pulse: 102  89 93  Temp:    98.7 F (37.1 C)  TempSrc:    Oral  Resp:    20  Height:      Weight:      SpO2: 95% 97%  92%    Body mass index is 35.69 kg/(m^2).  General:  Alert and oriented, no acute distress HEENT: Normal Neck: No  JVD Pulmonary: Clear to auscultation bilaterally Cardiac: Regular Rate and Rhythm  Abdomen: Soft, non-tender, non-distended, no mass, palpable fem fem pulse Skin: No rash Extremity Pulses:  2+ radial, brachial, femoral, dorsalis pedis pulses bilaterally Musculoskeletal: No deformity or edema  Neurologic: Upper and lower extremity motor 5/5 and symmetric  DATA:  CT images reviewed as per CT   ASSESSMENT:  Chronic pseudoaneurysm fem fem   PLAN:  Pt has followup with Dr Trula Slade scheduled in October.  Ruta Hinds, MD [facility] [place] Office: [phone] Pager: [phone]

## 2015-04-29 NOTE — Evaluation (Signed)
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 Apparently, the patient has also been having fevers and chills for 24 hours prior to admission. She also without emesis. The patient was found to be hypoxemic by EMS with oxygen saturations in the 60s. However in the emergency department, the patient was closely stable on 2 L nasal cannula. Because of elevated d-dimer, CT scan angiogram of chest was performed. It was negative for coronary embolus but showed perihilar patchy ground glass opacities right greater than left. In addition, the patient was found to be febrile up to 100.63F with tachycardia and hypotension with systolic blood pressure in 80's. She was given a 2 L bolus and then continued on maintenance fluids.   Clinical Impression   Pt admitted to hospital due to reasons stated above. Pt currently with functional limitiations due to the deficits listed below (see OT problem list). Pt prior to admission pt was a modified independent level using assistive devices for ADL completion. Pt currently requires min guard assist to min assist for safety with ADLs. Pt incontinent and unable to express when she needs to void or when she has voided once on toilet. Pt will benefit from skilled OT to increase independence and safety with ADLs and balance to allow safe discharge home.    Follow Up Recommendations  Home health OT    Equipment  Recommendations  3 in 1 bedside comode;Tub/shower bench    Recommendations for Other Services       Precautions / Restrictions Precautions Precautions: Fall      Mobility Bed Mobility Overal bed mobility: Needs Assistance Bed Mobility: Supine to Sit     Supine to sit: Supervision     General bed mobility comments: Pt in chair upon arrival  Transfers Overall transfer level: Needs assistance Equipment used: Rolling walker (2 wheeled) Transfers: Sit to/from Stand Sit to Stand: Min guard         General transfer comment: Verbal cues for hand placement and safety when performing sit to stand transfer    Balance Overall balance assessment: Needs assistance Sitting-balance support: Feet supported Sitting balance-Leahy Scale: Fair     Standing balance support: Bilateral upper extremity supported;During functional activity Standing balance-Leahy Scale: Poor Standing balance comment: While performing oral care standing at sink pt used sink counter for UE support.                            ADL Overall ADL's : Needs assistance/impaired Eating/Feeding: Independent   Grooming: Oral care;Min guard;Standing   Upper Body Bathing: Min guard;Sitting   Lower Body Bathing: Minimal assistance;Sit to/from stand   Upper Body Dressing : Min guard;Sitting   Lower Body Dressing: Minimal assistance;Sit to/from stand   Toilet Transfer: Min guard;Ambulation;BSC;RW   Toileting- Water quality scientist and Hygiene: Min guard;Sit to/from stand       Functional mobility during ADLs: Min guard;Cueing for safety;Rolling walker General ADL Comments: Pt reports daughter provides assistance with ADLs when necessary. Pt uses sock-aid to assit with donning  socks.     Vision     Perception     Praxis      Pertinent Vitals/Pain Pain Assessment: 0-10 Pain Score: 8  Pain Location: bottom of sternum Pain Descriptors / Indicators: Discomfort Pain Intervention(s): Monitored  during session;Repositioned;Patient requesting pain meds-RN notified     Hand Dominance Right   Extremity/Trunk Assessment Upper Extremity Assessment Upper Extremity Assessment: Overall WFL for tasks assessed   Lower Extremity Assessment Lower Extremity Assessment: Defer to PT evaluation   Cervical / Trunk Assessment Cervical / Trunk Assessment: Normal   Communication Communication Communication: No difficulties   Cognition Arousal/Alertness: Awake/alert Behavior During Therapy: WFL for tasks assessed/performed Overall Cognitive Status: Within Functional Limits for tasks assessed                     General Comments    Pt seemed saddened due to recent events leading to hospitalization. Pt incontinent and unable to state whether or not she had voided while sitting on BSC. When pt transferred from [facility], therapist noted urine inside [place]. Pt expressed frustration after being informed she had indeed voided.    Exercises       Shoulder Instructions      Home Living Family/patient expects to be discharged to:: Private residence Living Arrangements: Children Available Help at Discharge: Family;Available 24 hours/day Type of Home: Apartment Home Access: Level entry     Home Layout: One level     Bathroom Shower/Tub: Tub/shower unit Shower/tub characteristics: Architectural technologist: Standard Bathroom Accessibility: Yes How Accessible: Accessible via walker Home Equipment: [place] - 2 wheels;Shower seat;Other (comment);Hand held shower head;Adaptive equipment (suction cup grab bars) Adaptive Equipment: Sock aid;Other (Comment) (toileting aid) Additional Comments: Pt drives but hasnt been able to recently      Prior Functioning/Environment Level of Independence: Independent with assistive device(s)             OT Diagnosis: Generalized weakness;Cognitive deficits;Acute pain   OT Problem List: Decreased strength;Decreased activity tolerance;Impaired balance  (sitting and/or standing)   OT Treatment/Interventions: Self-care/ADL training;Therapeutic exercise;Energy conservation;Therapeutic activities;Patient/family education    OT Goals(Current goals can be found in the care plan section) Acute Rehab OT Goals Patient Stated Goal: to feel better OT Goal Formulation: With patient Time For Goal Achievement: 05/13/15 Potential to Achieve Goals: Fair ADL Goals Pt Will Perform Upper Body Bathing: with supervision;with adaptive equipment;sitting Pt Will Perform Lower Body Bathing: with min guard assist;with adaptive equipment;sit to/from stand Pt Will Perform Lower Body Dressing: with min guard assist;with adaptive equipment;sit to/from stand (sock-aid and reacher) Pt Will Perform Toileting - Clothing Manipulation and hygiene: with supervision;with adaptive equipment;sit to/from stand Pt Will Perform Tub/Shower Transfer: Tub transfer;with min guard assist;ambulating;shower seat;rolling walker  OT Frequency: Min 2X/week   Barriers to D/C:            Co-evaluation              End of Session Equipment Utilized During Treatment: Gait belt;Rolling walker Nurse Communication: Other (comment) (incontinence)  Activity Tolerance: Patient tolerated treatment well;Patient limited by pain Patient left: in chair;with call bell/phone within reach;with chair alarm set   Time: 1350-1428 OT Time Calculation (min): 38 min Charges:  OT General Charges $OT Visit: 1 Procedure OT Evaluation $Initial OT Evaluation Tier I: 1 Procedure OT Treatments $Self Care/Home Management : 8-22 mins G-Codes:    Lin Landsman 2015-05-20, 3:08 PM

## 2015-04-30 ENCOUNTER — Other Ambulatory Visit: Payer: Self-pay | Admitting: Physician Assistant

## 2015-04-30 ENCOUNTER — Telehealth: Payer: Self-pay | Admitting: Surgery

## 2015-04-30 DIAGNOSIS — R778 Other specified abnormalities of plasma proteins: Secondary | ICD-10-CM

## 2015-04-30 DIAGNOSIS — R7989 Other specified abnormal findings of blood chemistry: Principal | ICD-10-CM

## 2015-04-30 LAB — PROCALCITONIN: Procalcitonin: 0.35 ng/mL

## 2015-04-30 MED ORDER — METOPROLOL TARTRATE 25 MG PO TABS: 25.0000 mg | ORAL_TABLET | Freq: Two times a day (BID) | ORAL | Status: DC

## 2015-04-30 MED ORDER — CIPROFLOXACIN HCL 500 MG PO TABS: 500.0000 mg | ORAL_TABLET | Freq: Two times a day (BID) | ORAL | Status: DC

## 2015-04-30 MED ORDER — SACCHAROMYCES BOULARDII 250 MG PO CAPS: 250.0000 mg | ORAL_CAPSULE | Freq: Two times a day (BID) | ORAL | Status: DC

## 2015-04-30 NOTE — Telephone Encounter (Signed)
Pt is scheduled for 10/24 with [facility], dpm

## 2015-04-30 NOTE — Discharge Instructions (Signed)
Follow with Primary MD Donnajean Lopes, MD in 7 days   Get CBC, CMP, 2 view Chest X ray checked  by Primary MD next visit.    Activity: As tolerated with Full fall precautions use walker/cane & assistance as needed   Disposition Home     Diet: Heart Healthy    For Heart failure patients - Check your Weight same time everyday, if you gain over 2 pounds, or you develop in leg swelling, experience more shortness of breath or chest pain, call your Primary MD immediately. Follow Cardiac Low Salt Diet and 1.5 lit/day fluid restriction.   On your next visit with your primary care physician please Get Medicines reviewed and adjusted.   Please request your Prim.MD to go over all Hospital Tests and Procedure/Radiological results at the follow up, please get all Hospital records sent to your Prim MD by signing hospital release before you go home.   If you experience worsening of your admission symptoms, develop shortness of breath, life threatening emergency, suicidal or homicidal thoughts you must seek medical attention immediately by calling 911 or calling your MD immediately  if symptoms less severe.  You Must read complete instructions/literature along with all the possible adverse reactions/side effects for all the Medicines you take and that have been prescribed to you. Take any new Medicines after you have completely understood and accpet all the possible adverse reactions/side effects.   Do not drive, operating heavy machinery, perform activities at heights, swimming or participation in water activities or provide baby sitting services if your were admitted for syncope or siezures until you have seen by Primary MD or a Neurologist and advised to do so again.  Do not drive when taking Pain medications.    Do not take more than prescribed Pain, Sleep and Anxiety Medications  Special Instructions: If you have smoked or chewed Tobacco  in the last 2 yrs please stop smoking, stop any  regular Alcohol  and or any Recreational drug use.  Wear Seat belts while driving.   Please note  You were cared for by a hospitalist during your hospital stay. If you have any questions about your discharge medications or the care you received while you were in the hospital after you are discharged, you can call the unit and asked to speak with the hospitalist on call if the hospitalist that took care of you is not available. Once you are discharged, your primary care physician will handle any further medical issues. Please note that NO REFILLS for any discharge medications will be authorized once you are discharged, as it is imperative that you return to your primary care physician (or establish a relationship with a primary care physician if you do not have one) for your aftercare needs so that they can reassess your need for medications and monitor your lab values.

## 2015-04-30 NOTE — Progress Notes (Signed)
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 Apparently, the patient has also been having fevers and chills for 24 hours prior to admission. She also without emesis. The patient was found to be hypoxemic by EMS with oxygen saturations in the 60s. However in the emergency department, the patient was closely stable on 2 L nasal cannula. Because of elevated d-dimer, CT scan angiogram of chest was performed. It was negative for coronary embolus but showed perihilar patchy ground glass opacities right greater than left. In addition, the patient was found to be febrile up to 100.44F with tachycardia and hypotension with systolic blood pressure in 80's. She was given a 2 L bolus and then continued on maintenance fluids.    PT Comments    Patient is making improvements with mobility and gait.  Agree with need for HHPT at discharge.  Follow Up Recommendations  Home health PT     Equipment Recommendations  Rolling walker with 5" wheels;3in1 (PT)    Recommendations for Other Services       Precautions / Restrictions Precautions Precautions: Fall Restrictions Weight Bearing Restrictions: No    Mobility  Bed Mobility               General bed mobility comments: Patient in chair as PT entered room.  Transfers Overall transfer level: Needs assistance Equipment used: Rolling walker (2 wheeled) Transfers: Sit to/from Stand Sit to Stand: Min  guard         General transfer comment: Verbal cues for hand placement and safety when performing sit to stand transfer  Ambulation/Gait Ambulation/Gait assistance: Min guard Ambulation Distance (Feet): 60 Feet Assistive device: Rolling walker (2 wheeled) Gait Pattern/deviations: Step-through pattern;Decreased stride length Gait velocity: decreased Gait velocity interpretation: Below normal speed for age/gender General Gait Details: Patient able to maneuver RW.  Good balance today with RW.  Increased distance today to 60'.   Stairs            Wheelchair Mobility    Modified Rankin (Stroke Patients Only)       Balance           Standing balance support: Bilateral upper extremity supported Standing balance-Leahy Scale: Poor                      Cognition Arousal/Alertness: Awake/alert Behavior During Therapy: WFL for tasks assessed/performed;Anxious Overall Cognitive Status: Within Functional Limits for tasks assessed                      Exercises      General Comments        Pertinent Vitals/Pain Pain Assessment: No/denies pain    Home Living                      Prior Function            PT Goals (current goals can now be found in the care plan section) Acute Rehab PT Goals Patient Stated Goal: to feel better Progress towards PT  goals: Progressing toward goals    Frequency  Min 3X/week    PT Plan Current plan remains appropriate    Co-evaluation             End of Session Equipment Utilized During Treatment: Gait belt Activity Tolerance: Patient tolerated treatment well Patient left: in chair;with call bell/phone within reach     Time: 1135-1147 PT Time Calculation (min) (ACUTE ONLY): 12 min  Charges:  $Gait Training: 8-22 mins                    G Codes:      Despina Pole 05-20-2015, 12:03 PM Carita Pian. Sanjuana Kava, [place] Pager [phone]

## 2015-04-30 NOTE — Discharge Summary (Signed)
Deanna [name], is a 74 y.o. female  DOB September 01, 1940  MRN [id number].  Admission date:  04/26/2015  Admitting Physician  Ivor Costa, MD  Discharge Date:  04/30/2015   Primary MD  Donnajean Lopes, MD  Recommendations for primary care physician for things to follow:   Check CBC, CMP next visit. Must follow with vascular surgery and cardiology within 1-2 weeks.   Admission Diagnosis  Shortness of breath [R06.02] Obstipation [K59.00] Celiac artery stenosis [I77.4] Sepsis [A41.9]   Discharge Diagnosis  Shortness of breath [R06.02] Obstipation [K59.00] Celiac artery stenosis [I77.4] Sepsis [A41.9]      Principal Problem:   Sepsis Active Problems:   Hypothyroidism   ANXIETY DEPRESSION   Essential hypertension   GERD   Hyperlipidemia   Acute respiratory failure with hypoxia   Pneumonitis   Metabolic acidosis   Thrombocytopenia   Hypotension   Dysuria   OSA on CPAP   Celiac artery stenosis   LBP (low back pain)   Physical deconditioning   Sepsis with hypotension   Bacteremia, escherichia coli   Elevated troponin   Pyelonephritis due to Escherichia coli      Past Medical History  Diagnosis Date  . Hypertension   . Colon polyps   . Hemorrhoids   . Hypothyroidism   . GERD (gastroesophageal reflux disease)   . Anxiety   . Depression   . Myocardial infarction   . CAD (coronary artery disease)     followed by dr [name].  . Sinus drainage     took z-pack   finished yesterday  . Dysuria   . Atherosclerotic peripheral vascular disease   . Urticaria   . Vertigo, benign positional   . Peripheral arterial disease     Past Surgical History  Procedure Laterality Date  . Fem-fem bypass graft    . Coronary artery bypass graft    . Replacement total knee  05-2011  . Coronary angioplasty    .  Carotid-subclavian bypass graft  12/15/2011    Procedure: BYPASS GRAFT CAROTID-SUBCLAVIAN;  Surgeon: Serafina Mitchell, MD;  Location: [facility] OR;  Service: Vascular;  Laterality: Left;  Left Carotid subclavian bypass  . Joint replacement      Left knee  . Unilateral upper extremeity angiogram Left 11/15/2011    Procedure: UNILATERAL UPPER EXTREMEITY ANGIOGRAM;  Surgeon: Lorretta Harp, MD;  Location: [facility] CATH LAB;  Service: Cardiovascular;  Laterality: Left;  . Left heart catheterization with coronary/graft angiogram N/A 12/21/2011    Procedure: LEFT HEART CATHETERIZATION WITH Beatrix Fetters;  Surgeon: Leonie Man, MD;  Location: [facility] CATH LAB;  Service: Cardiovascular;  Laterality: N/A;  . Bilateral upper extremity angiogram N/A 09/11/2012    Procedure: BILATERAL UPPER EXTREMITY ANGIOGRAM;  Surgeon: Serafina Mitchell, MD;  Location: [facility] CATH LAB;  Service: Cardiovascular;  Laterality: N/A;  . Doppler echocardiography  05/27/2010, 09/17/2008    Mild Proximal septal thickening is noted. Left ventricular systolic functions is normal ejection fraction =>55%. the aortic valve appears to be mildly sclerotic   .  Nm myocar perf ejection fraction  09/22/2009, 07/03/2007    the post stress myocardial perfusion images show a normal pattern of perfusion is all regions. The post-stress ejection fraction is 68 %. no significant wall motion abnormalities noted. This is a low risk scan.  . 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 patent left common carotid- subclavian artery bypass with turbulent flow noted at the anastomosis with velocities of 295 cm/s  . Holter monitor  01/21/2008    The predominant rhythm was normal sinus rhythm. Minimum heartrate of 63 bpm at +01:00, maximum heartrate of 105 bpm at +  10:35; and the average heartrate of 75 bpm. 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 Apparently, the patient has also been having fevers and chills for 24 hours prior to admission. She also without emesis. The patient was found to be hypoxemic by EMS with oxygen saturations in the 60s. However in the emergency department, the patient was closely stable on 2 L nasal cannula. Because of elevated d-dimer, CT scan angiogram of chest was performed. It was negative for coronary embolus but showed perihilar patchy ground glass opacities right greater than left. In addition, the patient was found to be febrile up to 100.13F with tachycardia and hypotension with systolic blood pressure in 80's. She was given a 2 L bolus and then continued on maintenance fluids. Because of the patient's abdominal pain, CT abdomen and pelvis was performed.     Hospital Course:     Sepsis with Escherichia coli bacteremia due to pyelonephritis -Present at time of admission, renal ultrasound noted no signs of obstruction, responded very well to IV antibiotic and now will be transitioned to oral Cipro for another 10 days upon discharge. She is symptom-free worked with PT yesterday and qualified for home PT which will be ordered. Daughter was interested in placement however patient does not qualify for the same. Daughter had detailed discussions with Education officer, museum and  case Freight forwarder as well.   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 It not require any diuretic, completely symptom-free, Now off of oxygen and back to baseline . Continue supportive care. CT angiogram negative for PE.   Elevated troponins with known CAD -This is likely due to demand ischemia in the setting of sepsis, seen by cardiology, echogram noted with EF 60% and some akinesis which likely is chronic. Per cardiology outpatient stress test. Continue the patient on aspirin, Plavix along with beta blocker and statin for secondary prevention.   Thrombocytopenia -This has been chronic dating back to 06/08/2011, but acute worsening secondary to her sepsis, no signs of bleeding, fibrinogen 679, B-12 646. Outpatient CBC checked by PCP  in 7-10 days thereafter outpatient workup for thrombocytopenia.   Fem-Fem Pseudoanerysm -Seen by vascular surgery has to follow with Dr. Trula Slade outpatient, continue PAD medications.   Hypertension  -Amlodipine, valsartan discontinued secondary to hypotension  -Continue lower dose metoprolol tartrate    Peripheral vascular disease  -History of carotid-subclavian bypass 12/15/2011, and fem-fem bypass, continue dual antiplatelets therapy long with statin for secondary prevention.   Celiac artery stenosis -incidental finding onf CTA of chest, continue aspirin and Plavix and statin. Outpatient follow-up with Dr. Trula Slade. Seen by vascular surgery here.  Hyperlipidemia  -Continue statin   OSA -continue CPAP daily at bedtime, she is noncompliant with it has been counseled.       Discharge Condition: Fair  Follow UP  Follow-up Information    Follow up with Donnajean Lopes, MD. Schedule an appointment as soon as possible for a visit in 1 week.   Specialty:  Internal Medicine   Contact  information:   [address] [place] [address] [phone]       Follow up with Sanda Klein, MD. Schedule an appointment as soon as possible for a visit in 1 week.   Specialty:  Cardiology   Contact information:   [address] [place] [address] [phone]       Follow up with Annamarie Major, MD. Schedule an appointment as soon as possible for a visit in 1 week.   Specialties:  Vascular Surgery, Cardiology   Contact information:   [address] [place] [address] [phone]        Consults obtained - vascular surgery, cardiology  Diet and Activity recommendation: See Discharge Instructions below  Discharge Instructions       Discharge Instructions    Diet - low sodium heart healthy    Complete by:  As directed      Discharge instructions    Complete by:  As directed   Follow with Primary MD Donnajean Lopes, MD in 7 days   Get CBC, CMP, 2 view Chest X ray checked  by Primary MD next visit.    Activity: As tolerated with Full fall precautions use walker/cane & assistance as needed   Disposition Home     Diet: Heart Healthy    For Heart failure patients - Check your Weight same time everyday, if you gain over 2 pounds, or you develop in leg swelling, experience more shortness of breath or chest pain, call your Primary MD immediately. Follow Cardiac Low Salt Diet and 1.5 lit/day fluid restriction.   On your next visit with your primary care physician please Get Medicines reviewed and adjusted.   Please request your Prim.MD to go over all Hospital Tests and Procedure/Radiological results at the follow up, please get all Hospital records sent to your Prim MD by signing hospital release before you go home.   If you experience worsening of your admission symptoms, develop shortness of breath, life threatening emergency, suicidal or homicidal thoughts you must seek medical attention immediately by calling 911 or calling your MD  immediately  if symptoms less severe.  You Must read complete instructions/literature along with all the possible adverse reactions/side effects for all the Medicines you take and that have been prescribed to you. Take any new Medicines after you have completely understood and accpet all the possible adverse reactions/side effects.   Do not drive, operating heavy machinery, perform activities at heights, swimming or participation in water activities or provide baby sitting services if your were admitted for syncope or  siezures until you have seen by Primary MD or a Neurologist and advised to do so again.  Do not drive when taking Pain medications.    Do not take more than prescribed Pain, Sleep and Anxiety Medications  Special Instructions: If you have smoked or chewed Tobacco  in the last 2 yrs please stop smoking, stop any regular Alcohol  and or any Recreational drug use.  Wear Seat belts while driving.   Please note  You were cared for by a hospitalist during your hospital stay. If you have any questions about your discharge medications or the care you received while you were in the hospital after you are discharged, you can call the unit and asked to speak with the hospitalist on call if the hospitalist that took care of you is not available. Once you are discharged, your primary care physician will handle any further medical issues. Please note that NO REFILLS for any discharge medications will be authorized once you are discharged, as it is imperative that you return to your primary care physician (or establish a relationship with a primary care physician if you do not have one) for your aftercare needs so that they can reassess your need for medications and monitor your lab values.     Increase activity slowly    Complete by:  As directed              Discharge Medications       Medication List    STOP taking these medications        amLODipine 5 MG tablet  Commonly known  as:  NORVASC      TAKE these medications        aspirin EC 81 MG tablet  Take 81 mg by mouth daily.     B-complex with vitamin C tablet  Take 1 tablet by mouth daily.     cholecalciferol 1000 UNITS tablet  Commonly known as:  VITAMIN D  Take 1,000 Units by mouth daily.     ciprofloxacin 500 MG tablet  Commonly known as:  CIPRO  Take 1 tablet (500 mg total) by mouth 2 (two) times daily.     clopidogrel 75 MG tablet  Commonly known as:  PLAVIX  Take 1 tablet (75 mg total) by mouth once.     CRESTOR 10 MG tablet  Generic drug:  rosuvastatin  TAKE ONE TABLET BY MOUTH DAILY     levothyroxine 200 MCG tablet  Commonly known as:  SYNTHROID, LEVOTHROID  Take 200 mcg by mouth daily before breakfast.     LORazepam 1 MG tablet  Commonly known as:  ATIVAN  Take 0.5 tablets (0.5 mg total) by mouth as needed for anxiety. For aniexty     metoprolol tartrate 25 MG tablet  Commonly known as:  LOPRESSOR  Take 1 tablet (25 mg total) by mouth 2 (two) times daily.     ranitidine 150 MG capsule  Commonly known as:  ZANTAC  Take 150 mg by mouth daily.     saccharomyces boulardii 250 MG capsule  Commonly known as:  FLORASTOR  Take 1 capsule (250 mg total) by mouth 2 (two) times daily.     traMADol 50 MG tablet  Commonly known as:  ULTRAM  Take 50 mg by mouth every 12 (twelve) hours as needed for moderate pain.     valsartan 80 MG tablet  Commonly known as:  DIOVAN  Take 80 mg by mouth every morning.  Major procedures and Radiology Reports - PLEASE review detailed and final reports for all details, in brief -       Ct Angio Chest Pe W/cm &/or Wo Cm  04/26/2015   CLINICAL DATA:  Shortness of breath. Respiratory distress, onset this morning.  EXAM: CT ANGIOGRAPHY CHEST WITH CONTRAST  TECHNIQUE: Multidetector CT imaging of the chest was performed using the standard protocol during bolus administration of intravenous contrast. Multiplanar CT image reconstructions and MIPs were  obtained to evaluate the vascular anatomy.  CONTRAST:  139mL OMNIPAQUE IOHEXOL 350 MG/ML SOLN  COMPARISON:  Radiograph earlier this day.  Chest CT 06/09/2011  FINDINGS: There are no filling defects within the pulmonary arteries to suggest pulmonary embolus.  Diffuse calcified and noncalcified atheromatous plaque throughout the thoracic aorta and its branches. A left carotid subclavian bypass graft is seen. Post CABG with atherosclerosis of the native coronary arteries. The heart is normal in size. There is no pleural or pericardial effusion. No mediastinal or hilar adenopathy.  Dependent atelectasis in both lower lobes. Patchy ground-glass opacities in the perihilar lungs, right greater than left. There is smooth septal thickening involving the lower lobes. No pulmonary nodule or suspicious mass.  Evaluation of the upper abdomen demonstrates distended gallbladder. 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  There are no acute or suspicious osseous abnormalities. Multilevel degenerative change throughout the spine with diffuse disc space narrowing endplate irregularity.  Review of the MIP images confirms the above findings.  IMPRESSION: 1. No pulmonary embolus. 2. Perihilar ground-glass opacities, suspicious for pulmonary edema. 3. Diffuse atherosclerosis of the aorta and its branches. 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   Electronically Signed   By: Jeb Levering M.D.   On: 04/26/2015 05:43   Ct Abdomen Pelvis W Contrast  04/28/2015   CLINICAL DATA:  Sepsis, abdominal pain, fever  EXAM: CT ABDOMEN AND PELVIS WITH CONTRAST  TECHNIQUE: Multidetector CT imaging of the abdomen and pelvis was performed using the standard protocol following bolus administration of intravenous contrast.  CONTRAST:  192mL OMNIPAQUE IOHEXOL 300 MG/ML  SOLN  COMPARISON:  None.  FINDINGS: Lower chest: Trace bilateral pleural effusions. Associated  dependent atelectasis in the bilateral lower lobes.  Hepatobiliary: Liver is within normal limits.  Mildly distended gallbladder, but without associated inflammatory changes.  No intrahepatic or extrahepatic ductal dilatation.  Pancreas: Within normal limits. Trace fluid inferior to the pancreatic tail is favored to be associated with the left kidney (series 8/image 8).  Spleen: Within normal limits.  Adrenals/Urinary Tract: Adrenal glands within normal limits.  Right kidney is notable for a 2.5 cm lateral right lower pole renal cyst (series 8/ image 21) with associated nonspecific perinephric fluid inferiorly (series 8/ image 24). No hydronephrosis.  Heterogeneous perfusion of the left kidney (series 3/ images 41, 45, and 50), nonspecific but suspicious for pyelonephritis. Associated perinephric fluid anterior to the left upper pole (series 3/ image 36). No hydronephrosis.  Bladder is mildly thick-walled although underdistended.  Stomach/Bowel: Stomach is notable for a tiny hiatal hernia.  No evidence of bowel obstruction.  Normal appendix.  Mild sigmoid diverticulosis, without evidence of diverticulitis.  Vascular/Lymphatic: Atherosclerotic calcifications of the abdominal aorta and branch vessels.  Bifemoral bypass with associated 2.2 cm pseudoaneurysm in the midline anterior abdominal wall (series 3/image 90).  No suspicious abdominopelvic lymphadenopathy.  Reproductive: Uterus is mildly heterogeneous with dystrophic calcification along the anterior uterine body (series 3/image  35).  Bilateral ovaries are within normal limits.  Other: No abdominopelvic ascites.  Musculoskeletal: Degenerative changes of the visualized thoracolumbar spine.  IMPRESSION: Heterogeneous perfusion of the left kidney, suspicious for pyelonephritis.  Bladder is mildly thick-walled although underdistended. Correlate for cystitis.  Additional ancillary findings as above.   Electronically Signed   By: Julian Hy M.D.   On: 04/28/2015  03:41   Dg Chest Port 1 View  04/27/2015   CLINICAL DATA:  subsequent encounter pneumonitis, short of breath today  EXAM: PORTABLE CHEST - 1 VIEW  COMPARISON:  04/26/2015  FINDINGS: Status post CABG. Increased left perihilar infiltrate. Right lung radiographically clear. No pleural effusions.  IMPRESSION: Given the appearance of bilateral ground-glass infiltrates on CT scan performed yesterday the appearance of increased left perihilar opacity suggests asymmetric edema. Developing pneumonia or pneumonitis not excluded.   Electronically Signed   By: Skipper Cliche M.D.   On: 04/27/2015 07:56   Dg Chest Port 1 View  04/26/2015   CLINICAL DATA:  Headache and not feeling well today.  EXAM: PORTABLE CHEST - 1 VIEW  COMPARISON:  04/26/2015.  FINDINGS: The cardiac silhouette, mediastinal and hilar contours are within normal limits stable. Chronic lung changes but no acute pulmonary findings. No pleural effusion. The bony thorax is stable.  IMPRESSION: No acute cardiopulmonary findings.  Chronic lung changes.   Electronically Signed   By: Marijo Sanes M.D.   On: 04/26/2015 12:25   Dg Chest Port 1 View  04/26/2015   CLINICAL DATA:  74 year old female with shortness of breath and fever  EXAM: PORTABLE CHEST - 1 VIEW  COMPARISON:  Chest radiograph dated 12/19/2011  FINDINGS: Single-view of the chest demonstrates clear lungs. No pleural effusion or pneumothorax. Stable cardiac silhouette. Median sternotomy wires and CABG clips. Degenerative changes of the shoulders. No acute fracture.  IMPRESSION: No active disease.   Electronically Signed   By: Anner Crete M.D.   On: 04/26/2015 02:05   Dg Abd Portable 1v  04/26/2015   CLINICAL DATA:  Nausea, vomiting generalized abdominal pain.  EXAM: PORTABLE ABDOMEN - 1 VIEW  COMPARISON:  None.  FINDINGS: The bowel gas pattern is unremarkable. There is moderate stool in the colon. No findings for small bowel obstruction or free air. Contrast noted in the kidneys and  bladder from recent chest CT.  IMPRESSION: No plain film findings for an acute abdominal process.   Electronically Signed   By: Marijo Sanes M.D.   On: 04/26/2015 08:51    Micro Results      Recent Results (from the past 240 hour(s))  Culture, blood (routine x 2)     Status: None (Preliminary result)   Collection Time: 04/26/15  1:30 AM  Result Value Ref Range Status   Specimen Description BLOOD RIGHT HAND  Final   Special Requests BOTTLES DRAWN AEROBIC AND ANAEROBIC 3CC  Final   Culture  Setup Time   Final    GRAM NEGATIVE RODS IN BOTH AEROBIC AND ANAEROBIC BOTTLES CRITICAL RESULT CALLED TO, READ BACK BY AND VERIFIED WITH: Wayne Both AT 0753 ON [id number] BY S. YARBROUGH    Culture ESCHERICHIA COLI  Final   Report Status PENDING  Incomplete   Organism ID, Bacteria ESCHERICHIA COLI  Final      Susceptibility   Escherichia coli - MIC*    AMPICILLIN 4 SENSITIVE Sensitive     CEFAZOLIN <=4 SENSITIVE Sensitive     CEFEPIME <=1 SENSITIVE Sensitive     CEFTAZIDIME <=1 SENSITIVE Sensitive  CEFTRIAXONE <=1 SENSITIVE Sensitive     CIPROFLOXACIN <=0.25 SENSITIVE Sensitive     GENTAMICIN <=1 SENSITIVE Sensitive     IMIPENEM <=0.25 SENSITIVE Sensitive     TRIMETH/SULFA <=20 SENSITIVE Sensitive     AMPICILLIN/SULBACTAM <=2 SENSITIVE Sensitive     PIP/TAZO <=4 SENSITIVE Sensitive     * ESCHERICHIA COLI  Culture, blood (routine x 2)     Status: None   Collection Time: 04/26/15  1:43 AM  Result Value Ref Range Status   Specimen Description BLOOD LEFT HAND  Final   Special Requests BOTTLES DRAWN AEROBIC AND ANAEROBIC 4CC  Final   Culture  Setup Time   Final    GRAM NEGATIVE RODS AEROBIC BOTTLE ONLY CRITICAL RESULT CALLED TO, READ BACK BY AND VERIFIED WITH: C.WOLF,RN 04/26/15 @ 1755 BY V.WILKINS CONFIRMED BY K.WOOLLEN    Culture ESCHERICHIA COLI  Final   Report Status 04/28/2015 FINAL  Final   Organism ID, Bacteria ESCHERICHIA COLI  Final      Susceptibility   Escherichia coli - MIC*      AMPICILLIN <=2 SENSITIVE Sensitive     CEFAZOLIN <=4 SENSITIVE Sensitive     CEFEPIME <=1 SENSITIVE Sensitive     CEFTAZIDIME <=1 SENSITIVE Sensitive     CEFTRIAXONE <=1 SENSITIVE Sensitive     CIPROFLOXACIN <=0.25 SENSITIVE Sensitive     GENTAMICIN <=1 SENSITIVE Sensitive     IMIPENEM <=0.25 SENSITIVE Sensitive     TRIMETH/SULFA <=20 SENSITIVE Sensitive     AMPICILLIN/SULBACTAM <=2 SENSITIVE Sensitive     PIP/TAZO <=4 SENSITIVE Sensitive     * ESCHERICHIA COLI  Urine culture     Status: None   Collection Time: 04/26/15  8:11 AM  Result Value Ref Range Status   Specimen Description URINE, CATHETERIZED  Final   Special Requests NONE  Final   Culture >=100,000 COLONIES/mL ESCHERICHIA COLI  Final   Report Status 04/28/2015 FINAL  Final   Organism ID, Bacteria ESCHERICHIA COLI  Final      Susceptibility   Escherichia coli - MIC*    AMPICILLIN 4 SENSITIVE Sensitive     CEFAZOLIN <=4 SENSITIVE Sensitive     CEFTRIAXONE <=1 SENSITIVE Sensitive     CIPROFLOXACIN <=0.25 SENSITIVE Sensitive     GENTAMICIN <=1 SENSITIVE Sensitive     IMIPENEM <=0.25 SENSITIVE Sensitive     NITROFURANTOIN <=16 SENSITIVE Sensitive     TRIMETH/SULFA <=20 SENSITIVE Sensitive     AMPICILLIN/SULBACTAM <=2 SENSITIVE Sensitive     PIP/TAZO <=4 SENSITIVE Sensitive     * >=100,000 COLONIES/mL ESCHERICHIA COLI  MRSA PCR Screening     Status: None   Collection Time: 04/26/15  1:54 PM  Result Value Ref Range Status   MRSA by PCR NEGATIVE NEGATIVE Final    Comment:        The GeneXpert MRSA Assay (FDA approved for NASAL specimens only), is one component of a comprehensive MRSA colonization surveillance program. It is not intended to diagnose MRSA infection nor to guide or monitor treatment for MRSA infections.   Respiratory virus panel     Status: None   Collection Time: 04/27/15  2:55 PM  Result Value Ref Range Status   Source - RVPAN NASAL SWAB  Corrected   Respiratory Syncytial Virus A Negative  Negative Final   Respiratory Syncytial Virus B Negative Negative Final   Influenza A Negative Negative Final   Influenza B Negative Negative Final   Parainfluenza 1 Negative Negative Final   Parainfluenza 2 Negative Negative  Final   Parainfluenza 3 Negative Negative Final   Metapneumovirus Negative Negative Final   Rhinovirus Negative Negative Final   Adenovirus Negative Negative Final    Comment: (NOTE) Performed At: [facility] [address] [place], [place] [id number] Lindon Romp MD [id number]        Today   Subjective    Deanna [name] today has no headache,no chest abdominal pain,no new weakness tingling or numbness, feels much better wants to go home today.     Objective   Blood pressure 136/66, pulse 66, temperature 98.1 F (36.7 C), temperature source Oral, resp. rate 18, height 5\' 2"  (1.575 m), weight 88.54 kg (195 lb 3.1 oz), SpO2 95 %.   Intake/Output Summary (Last 24 hours) at 04/30/15 1001 Last data filed at 04/30/15 [id number]  Gross per 24 hour  Intake   2520 ml  Output   1902 ml  Net    618 ml    Exam Awake Alert, Oriented x 2, No new F.N deficits, Normal affect .AT,PERRAL Supple Neck,No JVD, No cervical lymphadenopathy appriciated.  Symmetrical Chest wall movement, Good air movement bilaterally, CTAB RRR,No Gallops,Rubs or new Murmurs, No Parasternal Heave +ve B.Sounds, Abd Soft, Non tender, No organomegaly appriciated, No rebound -guarding or rigidity. No Cyanosis, Clubbing or edema, No new Rash or bruise   Data Review   CBC w Diff: Lab Results  Component Value Date   WBC 3.7* 04/29/2015   HGB 10.3* 04/29/2015   HCT 31.3* 04/29/2015   PLT 83* 04/29/2015   LYMPHOPCT 12 04/27/2015   MONOPCT 7 04/27/2015   EOSPCT 0 04/27/2015   BASOPCT 0 04/27/2015    CMP: Lab Results  Component Value Date   NA 134* 04/29/2015   K 4.5 04/29/2015   CL 107 04/29/2015   CO2 20* 04/29/2015   BUN 9 04/29/2015   CREATININE 0.81 04/29/2015    PROT 5.9* 04/28/2015   ALBUMIN 2.7* 04/28/2015   BILITOT 1.3* 04/28/2015   ALKPHOS 62 04/28/2015   AST 24 04/28/2015   ALT 16 04/28/2015    Total Time in preparing paper work, data evaluation and todays exam - 35 minutes  Lala Lund K M.D on 04/30/2015 at 10:01 AM  [facility]   Office  [phone]

## 2015-04-30 NOTE — Care Management Note (Signed)
Case Management Note  Patient Details  Name: Deanna [name] MRN: [id number] Date of Birth: 28-Apr-1941  Subjective/Objective:          CM following for progression and d/c planning.          Action/Plan: 04/30/2015 Met with pt and daughter on 04/29/15 and later had long discussion with daughter re pt dependency and inability to selfcare. [place] arranged today to hopefully increase pt independence and to assist pt with medication management. 3:1 ordered per request. Plan to d/c to home today.   Expected Discharge Date:      04/30/2015           Expected Discharge Plan:  [place]  In-House Referral:     Discharge planning Services  CM Consult  Post Acute Care Choice:  Durable Medical Equipment Choice offered to:  Patient  DME Arranged:  3-N-1 DME Agency:  [place]:  RN, PT, Social Work, Nurse's Aide [place] Agency:  [place]  Status of Service:  Completed, signed off  Medicare Important Message Given:  Yes-second notification given Date Medicare IM Given:    Medicare IM give by:    Date Additional Medicare IM Given:    Additional Medicare Important Message give by:     If discussed at [place] of Stay Meetings, dates discussed:    Additional Comments:  Adron Bene, RN 04/30/2015, 10:49 AM

## 2015-04-30 NOTE — Telephone Encounter (Signed)
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  Please make sure she has an appt with DR Zada Finders

## 2015-04-30 NOTE — Progress Notes (Signed)
Pt has home CPAP per report. Pt will maintain this. Will call RT if assistance needed.

## 2015-04-30 NOTE — Progress Notes (Signed)
Cardiology outpatient lexiscan stress test and outpatient followup arranged.   Deanna Corrigan PA Pager: [phone]

## 2015-04-30 NOTE — Progress Notes (Signed)
Deanna [name] to be D/C'd Home per MD order.  Discussed prescriptions and follow up appointments with the patient. Prescriptions given to patient, medication list explained in detail. Pt verbalized understanding.    Medication List    STOP taking these medications        amLODipine 5 MG tablet  Commonly known as:  NORVASC      TAKE these medications        aspirin EC 81 MG tablet  Take 81 mg by mouth daily.     B-complex with vitamin C tablet  Take 1 tablet by mouth daily.     cholecalciferol 1000 UNITS tablet  Commonly known as:  VITAMIN D  Take 1,000 Units by mouth daily.     ciprofloxacin 500 MG tablet  Commonly known as:  CIPRO  Take 1 tablet (500 mg total) by mouth 2 (two) times daily.     clopidogrel 75 MG tablet  Commonly known as:  PLAVIX  Take 1 tablet (75 mg total) by mouth once.     CRESTOR 10 MG tablet  Generic drug:  rosuvastatin  TAKE ONE TABLET BY MOUTH DAILY     levothyroxine 200 MCG tablet  Commonly known as:  SYNTHROID, LEVOTHROID  Take 200 mcg by mouth daily before breakfast.     LORazepam 1 MG tablet  Commonly known as:  ATIVAN  Take 0.5 tablets (0.5 mg total) by mouth as needed for anxiety. For aniexty     metoprolol tartrate 25 MG tablet  Commonly known as:  LOPRESSOR  Take 1 tablet (25 mg total) by mouth 2 (two) times daily.     ranitidine 150 MG capsule  Commonly known as:  ZANTAC  Take 150 mg by mouth daily.     saccharomyces boulardii 250 MG capsule  Commonly known as:  FLORASTOR  Take 1 capsule (250 mg total) by mouth 2 (two) times daily.     traMADol 50 MG tablet  Commonly known as:  ULTRAM  Take 50 mg by mouth every 12 (twelve) hours as needed for moderate pain.     valsartan 80 MG tablet  Commonly known as:  DIOVAN  Take 80 mg by mouth every morning.        Filed Vitals:   04/30/15 1849  BP: 142/55  Pulse: 86  Temp: 98.7 F (37.1 C)  Resp: 18    Skin clean, dry and intact without evidence of skin break down, no  evidence of skin tears noted. IV catheter discontinued intact. Site without signs and symptoms of complications. Dressing and pressure applied. Pt denies pain at this time. No complaints noted.  An After Visit Summary was printed and given to the patient. Patient escorted via [place], and D/C home via private auto.  [name],[name] 04/30/2015 7:05 PM

## 2015-05-01 DIAGNOSIS — I2510 Atherosclerotic heart disease of native coronary artery without angina pectoris: Secondary | ICD-10-CM | POA: Diagnosis not present

## 2015-05-01 DIAGNOSIS — I739 Peripheral vascular disease, unspecified: Secondary | ICD-10-CM | POA: Diagnosis not present

## 2015-05-01 DIAGNOSIS — I10 Essential (primary) hypertension: Secondary | ICD-10-CM | POA: Diagnosis not present

## 2015-05-01 DIAGNOSIS — N12 Tubulo-interstitial nephritis, not specified as acute or chronic: Secondary | ICD-10-CM | POA: Diagnosis not present

## 2015-05-01 DIAGNOSIS — F329 Major depressive disorder, single episode, unspecified: Secondary | ICD-10-CM | POA: Diagnosis not present

## 2015-05-01 DIAGNOSIS — F419 Anxiety disorder, unspecified: Secondary | ICD-10-CM | POA: Diagnosis not present

## 2015-05-01 DIAGNOSIS — N390 Urinary tract infection, site not specified: Secondary | ICD-10-CM | POA: Diagnosis not present

## 2015-05-01 DIAGNOSIS — A4151 Sepsis due to Escherichia coli [E. coli]: Secondary | ICD-10-CM | POA: Diagnosis not present

## 2015-05-01 DIAGNOSIS — I774 Celiac artery compression syndrome: Secondary | ICD-10-CM | POA: Diagnosis not present

## 2015-05-01 LAB — CULTURE, BLOOD (ROUTINE X 2)

## 2015-05-05 DIAGNOSIS — F419 Anxiety disorder, unspecified: Secondary | ICD-10-CM | POA: Diagnosis not present

## 2015-05-05 DIAGNOSIS — I774 Celiac artery compression syndrome: Secondary | ICD-10-CM | POA: Diagnosis not present

## 2015-05-05 DIAGNOSIS — I10 Essential (primary) hypertension: Secondary | ICD-10-CM | POA: Diagnosis not present

## 2015-05-05 DIAGNOSIS — I739 Peripheral vascular disease, unspecified: Secondary | ICD-10-CM | POA: Diagnosis not present

## 2015-05-05 DIAGNOSIS — N12 Tubulo-interstitial nephritis, not specified as acute or chronic: Secondary | ICD-10-CM | POA: Diagnosis not present

## 2015-05-05 DIAGNOSIS — I2510 Atherosclerotic heart disease of native coronary artery without angina pectoris: Secondary | ICD-10-CM | POA: Diagnosis not present

## 2015-05-05 DIAGNOSIS — F329 Major depressive disorder, single episode, unspecified: Secondary | ICD-10-CM | POA: Diagnosis not present

## 2015-05-05 DIAGNOSIS — N390 Urinary tract infection, site not specified: Secondary | ICD-10-CM | POA: Diagnosis not present

## 2015-05-05 DIAGNOSIS — A4151 Sepsis due to Escherichia coli [E. coli]: Secondary | ICD-10-CM | POA: Diagnosis not present

## 2015-05-06 DIAGNOSIS — N390 Urinary tract infection, site not specified: Secondary | ICD-10-CM | POA: Diagnosis not present

## 2015-05-06 DIAGNOSIS — I2510 Atherosclerotic heart disease of native coronary artery without angina pectoris: Secondary | ICD-10-CM | POA: Diagnosis not present

## 2015-05-06 DIAGNOSIS — F329 Major depressive disorder, single episode, unspecified: Secondary | ICD-10-CM | POA: Diagnosis not present

## 2015-05-06 DIAGNOSIS — N12 Tubulo-interstitial nephritis, not specified as acute or chronic: Secondary | ICD-10-CM | POA: Diagnosis not present

## 2015-05-06 DIAGNOSIS — I774 Celiac artery compression syndrome: Secondary | ICD-10-CM | POA: Diagnosis not present

## 2015-05-06 DIAGNOSIS — F419 Anxiety disorder, unspecified: Secondary | ICD-10-CM | POA: Diagnosis not present

## 2015-05-06 DIAGNOSIS — A4151 Sepsis due to Escherichia coli [E. coli]: Secondary | ICD-10-CM | POA: Diagnosis not present

## 2015-05-06 DIAGNOSIS — I10 Essential (primary) hypertension: Secondary | ICD-10-CM | POA: Diagnosis not present

## 2015-05-06 DIAGNOSIS — I739 Peripheral vascular disease, unspecified: Secondary | ICD-10-CM | POA: Diagnosis not present

## 2015-05-07 DIAGNOSIS — I739 Peripheral vascular disease, unspecified: Secondary | ICD-10-CM | POA: Diagnosis not present

## 2015-05-07 DIAGNOSIS — N12 Tubulo-interstitial nephritis, not specified as acute or chronic: Secondary | ICD-10-CM | POA: Diagnosis not present

## 2015-05-07 DIAGNOSIS — I10 Essential (primary) hypertension: Secondary | ICD-10-CM | POA: Diagnosis not present

## 2015-05-07 DIAGNOSIS — I2510 Atherosclerotic heart disease of native coronary artery without angina pectoris: Secondary | ICD-10-CM | POA: Diagnosis not present

## 2015-05-07 DIAGNOSIS — F329 Major depressive disorder, single episode, unspecified: Secondary | ICD-10-CM | POA: Diagnosis not present

## 2015-05-07 DIAGNOSIS — I774 Celiac artery compression syndrome: Secondary | ICD-10-CM | POA: Diagnosis not present

## 2015-05-07 DIAGNOSIS — A4151 Sepsis due to Escherichia coli [E. coli]: Secondary | ICD-10-CM | POA: Diagnosis not present

## 2015-05-07 DIAGNOSIS — F419 Anxiety disorder, unspecified: Secondary | ICD-10-CM | POA: Diagnosis not present

## 2015-05-07 DIAGNOSIS — N390 Urinary tract infection, site not specified: Secondary | ICD-10-CM | POA: Diagnosis not present

## 2015-05-08 DIAGNOSIS — I739 Peripheral vascular disease, unspecified: Secondary | ICD-10-CM | POA: Diagnosis not present

## 2015-05-08 DIAGNOSIS — I2510 Atherosclerotic heart disease of native coronary artery without angina pectoris: Secondary | ICD-10-CM | POA: Diagnosis not present

## 2015-05-08 DIAGNOSIS — I774 Celiac artery compression syndrome: Secondary | ICD-10-CM | POA: Diagnosis not present

## 2015-05-08 DIAGNOSIS — F419 Anxiety disorder, unspecified: Secondary | ICD-10-CM | POA: Diagnosis not present

## 2015-05-08 DIAGNOSIS — F329 Major depressive disorder, single episode, unspecified: Secondary | ICD-10-CM | POA: Diagnosis not present

## 2015-05-08 DIAGNOSIS — A4151 Sepsis due to Escherichia coli [E. coli]: Secondary | ICD-10-CM | POA: Diagnosis not present

## 2015-05-08 DIAGNOSIS — N390 Urinary tract infection, site not specified: Secondary | ICD-10-CM | POA: Diagnosis not present

## 2015-05-08 DIAGNOSIS — N12 Tubulo-interstitial nephritis, not specified as acute or chronic: Secondary | ICD-10-CM | POA: Diagnosis not present

## 2015-05-08 DIAGNOSIS — I10 Essential (primary) hypertension: Secondary | ICD-10-CM | POA: Diagnosis not present

## 2015-05-11 DIAGNOSIS — A4151 Sepsis due to Escherichia coli [E. coli]: Secondary | ICD-10-CM | POA: Diagnosis not present

## 2015-05-11 DIAGNOSIS — F419 Anxiety disorder, unspecified: Secondary | ICD-10-CM | POA: Diagnosis not present

## 2015-05-11 DIAGNOSIS — I774 Celiac artery compression syndrome: Secondary | ICD-10-CM | POA: Diagnosis not present

## 2015-05-11 DIAGNOSIS — N390 Urinary tract infection, site not specified: Secondary | ICD-10-CM | POA: Diagnosis not present

## 2015-05-11 DIAGNOSIS — N12 Tubulo-interstitial nephritis, not specified as acute or chronic: Secondary | ICD-10-CM | POA: Diagnosis not present

## 2015-05-11 DIAGNOSIS — I10 Essential (primary) hypertension: Secondary | ICD-10-CM | POA: Diagnosis not present

## 2015-05-11 DIAGNOSIS — I739 Peripheral vascular disease, unspecified: Secondary | ICD-10-CM | POA: Diagnosis not present

## 2015-05-11 DIAGNOSIS — F329 Major depressive disorder, single episode, unspecified: Secondary | ICD-10-CM | POA: Diagnosis not present

## 2015-05-11 DIAGNOSIS — I2510 Atherosclerotic heart disease of native coronary artery without angina pectoris: Secondary | ICD-10-CM | POA: Diagnosis not present

## 2015-05-12 ENCOUNTER — Telehealth (HOSPITAL_COMMUNITY): Payer: Self-pay | Admitting: *Deleted

## 2015-05-12 DIAGNOSIS — F419 Anxiety disorder, unspecified: Secondary | ICD-10-CM | POA: Diagnosis not present

## 2015-05-12 DIAGNOSIS — N12 Tubulo-interstitial nephritis, not specified as acute or chronic: Secondary | ICD-10-CM | POA: Diagnosis not present

## 2015-05-12 DIAGNOSIS — I2510 Atherosclerotic heart disease of native coronary artery without angina pectoris: Secondary | ICD-10-CM | POA: Diagnosis not present

## 2015-05-12 DIAGNOSIS — I774 Celiac artery compression syndrome: Secondary | ICD-10-CM | POA: Diagnosis not present

## 2015-05-12 DIAGNOSIS — A4151 Sepsis due to Escherichia coli [E. coli]: Secondary | ICD-10-CM | POA: Diagnosis not present

## 2015-05-12 DIAGNOSIS — I739 Peripheral vascular disease, unspecified: Secondary | ICD-10-CM | POA: Diagnosis not present

## 2015-05-12 DIAGNOSIS — F329 Major depressive disorder, single episode, unspecified: Secondary | ICD-10-CM | POA: Diagnosis not present

## 2015-05-12 DIAGNOSIS — N390 Urinary tract infection, site not specified: Secondary | ICD-10-CM | POA: Diagnosis not present

## 2015-05-12 DIAGNOSIS — I10 Essential (primary) hypertension: Secondary | ICD-10-CM | POA: Diagnosis not present

## 2015-05-12 NOTE — Telephone Encounter (Signed)
Left message on voicemail per DPR in reference to upcoming appointment scheduled on 05/14/15 at 0730 with detailed instructions given per Myocardial Perfusion Study Information Sheet for the test. LM to arrive 15 minutes early, and that it is imperative to arrive on time for appointment to keep from having the test rescheduled. If you need to cancel or reschedule your appointment, please call the office within 24 hours of your appointment. Failure to do so may result in a cancellation of your appointment, and a $50 no show fee. Phone number given for call back for any questions. Aryella Besecker, Ranae [place]

## 2015-05-13 DIAGNOSIS — I739 Peripheral vascular disease, unspecified: Secondary | ICD-10-CM | POA: Diagnosis not present

## 2015-05-13 DIAGNOSIS — I2510 Atherosclerotic heart disease of native coronary artery without angina pectoris: Secondary | ICD-10-CM | POA: Diagnosis not present

## 2015-05-13 DIAGNOSIS — F329 Major depressive disorder, single episode, unspecified: Secondary | ICD-10-CM | POA: Diagnosis not present

## 2015-05-13 DIAGNOSIS — A4151 Sepsis due to Escherichia coli [E. coli]: Secondary | ICD-10-CM | POA: Diagnosis not present

## 2015-05-13 DIAGNOSIS — I10 Essential (primary) hypertension: Secondary | ICD-10-CM | POA: Diagnosis not present

## 2015-05-13 DIAGNOSIS — N390 Urinary tract infection, site not specified: Secondary | ICD-10-CM | POA: Diagnosis not present

## 2015-05-13 DIAGNOSIS — N12 Tubulo-interstitial nephritis, not specified as acute or chronic: Secondary | ICD-10-CM | POA: Diagnosis not present

## 2015-05-13 DIAGNOSIS — F419 Anxiety disorder, unspecified: Secondary | ICD-10-CM | POA: Diagnosis not present

## 2015-05-13 DIAGNOSIS — I774 Celiac artery compression syndrome: Secondary | ICD-10-CM | POA: Diagnosis not present

## 2015-05-14 ENCOUNTER — Ambulatory Visit (HOSPITAL_COMMUNITY): Payer: Commercial Managed Care - HMO

## 2015-05-14 ENCOUNTER — Telehealth (HOSPITAL_COMMUNITY): Payer: Self-pay | Admitting: *Deleted

## 2015-05-14 DIAGNOSIS — I739 Peripheral vascular disease, unspecified: Secondary | ICD-10-CM | POA: Diagnosis not present

## 2015-05-14 DIAGNOSIS — N390 Urinary tract infection, site not specified: Secondary | ICD-10-CM | POA: Diagnosis not present

## 2015-05-14 DIAGNOSIS — F419 Anxiety disorder, unspecified: Secondary | ICD-10-CM | POA: Diagnosis not present

## 2015-05-14 DIAGNOSIS — I774 Celiac artery compression syndrome: Secondary | ICD-10-CM | POA: Diagnosis not present

## 2015-05-14 DIAGNOSIS — F329 Major depressive disorder, single episode, unspecified: Secondary | ICD-10-CM | POA: Diagnosis not present

## 2015-05-14 DIAGNOSIS — I2510 Atherosclerotic heart disease of native coronary artery without angina pectoris: Secondary | ICD-10-CM | POA: Diagnosis not present

## 2015-05-14 DIAGNOSIS — A4151 Sepsis due to Escherichia coli [E. coli]: Secondary | ICD-10-CM | POA: Diagnosis not present

## 2015-05-14 DIAGNOSIS — N12 Tubulo-interstitial nephritis, not specified as acute or chronic: Secondary | ICD-10-CM | POA: Diagnosis not present

## 2015-05-14 DIAGNOSIS — I10 Essential (primary) hypertension: Secondary | ICD-10-CM | POA: Diagnosis not present

## 2015-05-14 NOTE — Telephone Encounter (Signed)
Patient was a no show for her Myocardial Perfusion test on 05/14/15  @ 730 am. Left  Message on voicemail reminding patient of appointment and to call back to reschedule. Crissie Figures, RN

## 2015-05-15 ENCOUNTER — Ambulatory Visit: Payer: Commercial Managed Care - HMO | Admitting: Physician Assistant

## 2015-05-15 DIAGNOSIS — I739 Peripheral vascular disease, unspecified: Secondary | ICD-10-CM | POA: Diagnosis not present

## 2015-05-15 DIAGNOSIS — A4151 Sepsis due to Escherichia coli [E. coli]: Secondary | ICD-10-CM | POA: Diagnosis not present

## 2015-05-15 DIAGNOSIS — N12 Tubulo-interstitial nephritis, not specified as acute or chronic: Secondary | ICD-10-CM | POA: Diagnosis not present

## 2015-05-15 DIAGNOSIS — I2510 Atherosclerotic heart disease of native coronary artery without angina pectoris: Secondary | ICD-10-CM | POA: Diagnosis not present

## 2015-05-15 DIAGNOSIS — F419 Anxiety disorder, unspecified: Secondary | ICD-10-CM | POA: Diagnosis not present

## 2015-05-15 DIAGNOSIS — I10 Essential (primary) hypertension: Secondary | ICD-10-CM | POA: Diagnosis not present

## 2015-05-15 DIAGNOSIS — N390 Urinary tract infection, site not specified: Secondary | ICD-10-CM | POA: Diagnosis not present

## 2015-05-15 DIAGNOSIS — I774 Celiac artery compression syndrome: Secondary | ICD-10-CM | POA: Diagnosis not present

## 2015-05-15 DIAGNOSIS — F329 Major depressive disorder, single episode, unspecified: Secondary | ICD-10-CM | POA: Diagnosis not present

## 2015-05-18 DIAGNOSIS — R600 Localized edema: Secondary | ICD-10-CM | POA: Diagnosis not present

## 2015-05-18 DIAGNOSIS — R159 Full incontinence of feces: Secondary | ICD-10-CM | POA: Diagnosis not present

## 2015-05-18 DIAGNOSIS — N12 Tubulo-interstitial nephritis, not specified as acute or chronic: Secondary | ICD-10-CM | POA: Diagnosis not present

## 2015-05-18 DIAGNOSIS — N3945 Continuous leakage: Secondary | ICD-10-CM | POA: Diagnosis not present

## 2015-05-18 DIAGNOSIS — M25551 Pain in right hip: Secondary | ICD-10-CM | POA: Diagnosis not present

## 2015-05-18 DIAGNOSIS — H539 Unspecified visual disturbance: Secondary | ICD-10-CM | POA: Diagnosis not present

## 2015-05-19 DIAGNOSIS — N390 Urinary tract infection, site not specified: Secondary | ICD-10-CM | POA: Diagnosis not present

## 2015-05-19 DIAGNOSIS — F329 Major depressive disorder, single episode, unspecified: Secondary | ICD-10-CM | POA: Diagnosis not present

## 2015-05-19 DIAGNOSIS — F419 Anxiety disorder, unspecified: Secondary | ICD-10-CM | POA: Diagnosis not present

## 2015-05-19 DIAGNOSIS — I10 Essential (primary) hypertension: Secondary | ICD-10-CM | POA: Diagnosis not present

## 2015-05-19 DIAGNOSIS — A4151 Sepsis due to Escherichia coli [E. coli]: Secondary | ICD-10-CM | POA: Diagnosis not present

## 2015-05-19 DIAGNOSIS — I774 Celiac artery compression syndrome: Secondary | ICD-10-CM | POA: Diagnosis not present

## 2015-05-19 DIAGNOSIS — I739 Peripheral vascular disease, unspecified: Secondary | ICD-10-CM | POA: Diagnosis not present

## 2015-05-19 DIAGNOSIS — N12 Tubulo-interstitial nephritis, not specified as acute or chronic: Secondary | ICD-10-CM | POA: Diagnosis not present

## 2015-05-19 DIAGNOSIS — I2510 Atherosclerotic heart disease of native coronary artery without angina pectoris: Secondary | ICD-10-CM | POA: Diagnosis not present

## 2015-05-20 DIAGNOSIS — F419 Anxiety disorder, unspecified: Secondary | ICD-10-CM | POA: Diagnosis not present

## 2015-05-20 DIAGNOSIS — I10 Essential (primary) hypertension: Secondary | ICD-10-CM | POA: Diagnosis not present

## 2015-05-20 DIAGNOSIS — I774 Celiac artery compression syndrome: Secondary | ICD-10-CM | POA: Diagnosis not present

## 2015-05-20 DIAGNOSIS — I739 Peripheral vascular disease, unspecified: Secondary | ICD-10-CM | POA: Diagnosis not present

## 2015-05-20 DIAGNOSIS — F329 Major depressive disorder, single episode, unspecified: Secondary | ICD-10-CM | POA: Diagnosis not present

## 2015-05-20 DIAGNOSIS — N12 Tubulo-interstitial nephritis, not specified as acute or chronic: Secondary | ICD-10-CM | POA: Diagnosis not present

## 2015-05-20 DIAGNOSIS — N390 Urinary tract infection, site not specified: Secondary | ICD-10-CM | POA: Diagnosis not present

## 2015-05-20 DIAGNOSIS — A4151 Sepsis due to Escherichia coli [E. coli]: Secondary | ICD-10-CM | POA: Diagnosis not present

## 2015-05-20 DIAGNOSIS — I2510 Atherosclerotic heart disease of native coronary artery without angina pectoris: Secondary | ICD-10-CM | POA: Diagnosis not present

## 2015-05-21 DIAGNOSIS — A4151 Sepsis due to Escherichia coli [E. coli]: Secondary | ICD-10-CM | POA: Diagnosis not present

## 2015-05-21 DIAGNOSIS — F329 Major depressive disorder, single episode, unspecified: Secondary | ICD-10-CM | POA: Diagnosis not present

## 2015-05-21 DIAGNOSIS — N390 Urinary tract infection, site not specified: Secondary | ICD-10-CM | POA: Diagnosis not present

## 2015-05-21 DIAGNOSIS — N12 Tubulo-interstitial nephritis, not specified as acute or chronic: Secondary | ICD-10-CM | POA: Diagnosis not present

## 2015-05-21 DIAGNOSIS — I10 Essential (primary) hypertension: Secondary | ICD-10-CM | POA: Diagnosis not present

## 2015-05-21 DIAGNOSIS — I774 Celiac artery compression syndrome: Secondary | ICD-10-CM | POA: Diagnosis not present

## 2015-05-21 DIAGNOSIS — F419 Anxiety disorder, unspecified: Secondary | ICD-10-CM | POA: Diagnosis not present

## 2015-05-21 DIAGNOSIS — I2510 Atherosclerotic heart disease of native coronary artery without angina pectoris: Secondary | ICD-10-CM | POA: Diagnosis not present

## 2015-05-21 DIAGNOSIS — I739 Peripheral vascular disease, unspecified: Secondary | ICD-10-CM | POA: Diagnosis not present

## 2015-05-22 DIAGNOSIS — I739 Peripheral vascular disease, unspecified: Secondary | ICD-10-CM | POA: Diagnosis not present

## 2015-05-22 DIAGNOSIS — A4151 Sepsis due to Escherichia coli [E. coli]: Secondary | ICD-10-CM | POA: Diagnosis not present

## 2015-05-22 DIAGNOSIS — N390 Urinary tract infection, site not specified: Secondary | ICD-10-CM | POA: Diagnosis not present

## 2015-05-22 DIAGNOSIS — F329 Major depressive disorder, single episode, unspecified: Secondary | ICD-10-CM | POA: Diagnosis not present

## 2015-05-22 DIAGNOSIS — F419 Anxiety disorder, unspecified: Secondary | ICD-10-CM | POA: Diagnosis not present

## 2015-05-22 DIAGNOSIS — N12 Tubulo-interstitial nephritis, not specified as acute or chronic: Secondary | ICD-10-CM | POA: Diagnosis not present

## 2015-05-22 DIAGNOSIS — I2510 Atherosclerotic heart disease of native coronary artery without angina pectoris: Secondary | ICD-10-CM | POA: Diagnosis not present

## 2015-05-22 DIAGNOSIS — I774 Celiac artery compression syndrome: Secondary | ICD-10-CM | POA: Diagnosis not present

## 2015-05-22 DIAGNOSIS — I10 Essential (primary) hypertension: Secondary | ICD-10-CM | POA: Diagnosis not present

## 2015-05-25 DIAGNOSIS — N390 Urinary tract infection, site not specified: Secondary | ICD-10-CM | POA: Diagnosis not present

## 2015-05-25 DIAGNOSIS — I774 Celiac artery compression syndrome: Secondary | ICD-10-CM | POA: Diagnosis not present

## 2015-05-25 DIAGNOSIS — I739 Peripheral vascular disease, unspecified: Secondary | ICD-10-CM | POA: Diagnosis not present

## 2015-05-25 DIAGNOSIS — F329 Major depressive disorder, single episode, unspecified: Secondary | ICD-10-CM | POA: Diagnosis not present

## 2015-05-25 DIAGNOSIS — F419 Anxiety disorder, unspecified: Secondary | ICD-10-CM | POA: Diagnosis not present

## 2015-05-25 DIAGNOSIS — I2510 Atherosclerotic heart disease of native coronary artery without angina pectoris: Secondary | ICD-10-CM | POA: Diagnosis not present

## 2015-05-25 DIAGNOSIS — A4151 Sepsis due to Escherichia coli [E. coli]: Secondary | ICD-10-CM | POA: Diagnosis not present

## 2015-05-25 DIAGNOSIS — N12 Tubulo-interstitial nephritis, not specified as acute or chronic: Secondary | ICD-10-CM | POA: Diagnosis not present

## 2015-05-25 DIAGNOSIS — I10 Essential (primary) hypertension: Secondary | ICD-10-CM | POA: Diagnosis not present

## 2015-05-27 ENCOUNTER — Encounter: Payer: Self-pay | Admitting: Surgery

## 2015-05-27 DIAGNOSIS — I10 Essential (primary) hypertension: Secondary | ICD-10-CM | POA: Diagnosis not present

## 2015-05-27 DIAGNOSIS — I739 Peripheral vascular disease, unspecified: Secondary | ICD-10-CM | POA: Diagnosis not present

## 2015-05-27 DIAGNOSIS — N390 Urinary tract infection, site not specified: Secondary | ICD-10-CM | POA: Diagnosis not present

## 2015-05-27 DIAGNOSIS — A4151 Sepsis due to Escherichia coli [E. coli]: Secondary | ICD-10-CM | POA: Diagnosis not present

## 2015-05-27 DIAGNOSIS — F419 Anxiety disorder, unspecified: Secondary | ICD-10-CM | POA: Diagnosis not present

## 2015-05-27 DIAGNOSIS — I774 Celiac artery compression syndrome: Secondary | ICD-10-CM | POA: Diagnosis not present

## 2015-05-27 DIAGNOSIS — I2510 Atherosclerotic heart disease of native coronary artery without angina pectoris: Secondary | ICD-10-CM | POA: Diagnosis not present

## 2015-05-27 DIAGNOSIS — F329 Major depressive disorder, single episode, unspecified: Secondary | ICD-10-CM | POA: Diagnosis not present

## 2015-05-27 DIAGNOSIS — N12 Tubulo-interstitial nephritis, not specified as acute or chronic: Secondary | ICD-10-CM | POA: Diagnosis not present

## 2015-05-28 DIAGNOSIS — I2510 Atherosclerotic heart disease of native coronary artery without angina pectoris: Secondary | ICD-10-CM | POA: Diagnosis not present

## 2015-05-28 DIAGNOSIS — I774 Celiac artery compression syndrome: Secondary | ICD-10-CM | POA: Diagnosis not present

## 2015-05-28 DIAGNOSIS — N12 Tubulo-interstitial nephritis, not specified as acute or chronic: Secondary | ICD-10-CM | POA: Diagnosis not present

## 2015-05-28 DIAGNOSIS — I10 Essential (primary) hypertension: Secondary | ICD-10-CM | POA: Diagnosis not present

## 2015-05-28 DIAGNOSIS — F329 Major depressive disorder, single episode, unspecified: Secondary | ICD-10-CM | POA: Diagnosis not present

## 2015-05-28 DIAGNOSIS — A4151 Sepsis due to Escherichia coli [E. coli]: Secondary | ICD-10-CM | POA: Diagnosis not present

## 2015-05-28 DIAGNOSIS — I739 Peripheral vascular disease, unspecified: Secondary | ICD-10-CM | POA: Diagnosis not present

## 2015-05-28 DIAGNOSIS — N390 Urinary tract infection, site not specified: Secondary | ICD-10-CM | POA: Diagnosis not present

## 2015-05-28 DIAGNOSIS — F419 Anxiety disorder, unspecified: Secondary | ICD-10-CM | POA: Diagnosis not present

## 2015-05-29 DIAGNOSIS — I774 Celiac artery compression syndrome: Secondary | ICD-10-CM | POA: Diagnosis not present

## 2015-05-29 DIAGNOSIS — F329 Major depressive disorder, single episode, unspecified: Secondary | ICD-10-CM | POA: Diagnosis not present

## 2015-05-29 DIAGNOSIS — I10 Essential (primary) hypertension: Secondary | ICD-10-CM | POA: Diagnosis not present

## 2015-05-29 DIAGNOSIS — I2510 Atherosclerotic heart disease of native coronary artery without angina pectoris: Secondary | ICD-10-CM | POA: Diagnosis not present

## 2015-05-29 DIAGNOSIS — F419 Anxiety disorder, unspecified: Secondary | ICD-10-CM | POA: Diagnosis not present

## 2015-05-29 DIAGNOSIS — N390 Urinary tract infection, site not specified: Secondary | ICD-10-CM | POA: Diagnosis not present

## 2015-05-29 DIAGNOSIS — A4151 Sepsis due to Escherichia coli [E. coli]: Secondary | ICD-10-CM | POA: Diagnosis not present

## 2015-05-29 DIAGNOSIS — N12 Tubulo-interstitial nephritis, not specified as acute or chronic: Secondary | ICD-10-CM | POA: Diagnosis not present

## 2015-05-29 DIAGNOSIS — I739 Peripheral vascular disease, unspecified: Secondary | ICD-10-CM | POA: Diagnosis not present

## 2015-06-01 ENCOUNTER — Encounter: Payer: Self-pay | Admitting: Surgery

## 2015-06-01 ENCOUNTER — Ambulatory Visit (INDEPENDENT_AMBULATORY_CARE_PROVIDER_SITE_OTHER): Payer: Commercial Managed Care - HMO | Admitting: Surgery

## 2015-06-01 ENCOUNTER — Ambulatory Visit (INDEPENDENT_AMBULATORY_CARE_PROVIDER_SITE_OTHER)
Admission: RE | Admit: 2015-06-01 | Discharge: 2015-06-01 | Disposition: A | Discharge status: Home / Self Care | Payer: Commercial Managed Care - HMO | Source: Ambulatory Visit | Attending: Surgery | Admitting: Surgery

## 2015-06-01 ENCOUNTER — Ambulatory Visit (HOSPITAL_COMMUNITY)
Admission: RE | Admit: 2015-06-01 | Discharge: 2015-06-01 | Disposition: A | Discharge status: Home / Self Care | Payer: Commercial Managed Care - HMO | Source: Ambulatory Visit | Attending: Surgery | Admitting: Surgery

## 2015-06-01 VITALS — BP 151/67 | HR 74 | Ht 62.0 in | Wt 200.8 lb

## 2015-06-01 DIAGNOSIS — I70229 Atherosclerosis of native arteries of extremities with rest pain, unspecified extremity: Secondary | ICD-10-CM

## 2015-06-01 DIAGNOSIS — M79604 Pain in right leg: Secondary | ICD-10-CM

## 2015-06-01 DIAGNOSIS — Z95828 Presence of other vascular implants and grafts: Secondary | ICD-10-CM | POA: Diagnosis not present

## 2015-06-01 DIAGNOSIS — I998 Other disorder of circulatory system: Secondary | ICD-10-CM

## 2015-06-01 DIAGNOSIS — I70213 Atherosclerosis of native arteries of extremities with intermittent claudication, bilateral legs: Secondary | ICD-10-CM
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
## 2015-06-02 ENCOUNTER — Telehealth: Payer: Self-pay

## 2015-06-02 DIAGNOSIS — F329 Major depressive disorder, single episode, unspecified: Secondary | ICD-10-CM | POA: Diagnosis not present

## 2015-06-02 DIAGNOSIS — I10 Essential (primary) hypertension: Secondary | ICD-10-CM | POA: Diagnosis not present

## 2015-06-02 DIAGNOSIS — A4151 Sepsis due to Escherichia coli [E. coli]: Secondary | ICD-10-CM | POA: Diagnosis not present

## 2015-06-02 DIAGNOSIS — F419 Anxiety disorder, unspecified: Secondary | ICD-10-CM | POA: Diagnosis not present

## 2015-06-02 DIAGNOSIS — I739 Peripheral vascular disease, unspecified: Secondary | ICD-10-CM | POA: Diagnosis not present

## 2015-06-02 DIAGNOSIS — N390 Urinary tract infection, site not specified: Secondary | ICD-10-CM | POA: Diagnosis not present

## 2015-06-02 DIAGNOSIS — N12 Tubulo-interstitial nephritis, not specified as acute or chronic: Secondary | ICD-10-CM | POA: Diagnosis not present

## 2015-06-02 DIAGNOSIS — I2510 Atherosclerotic heart disease of native coronary artery without angina pectoris: Secondary | ICD-10-CM | POA: Diagnosis not present

## 2015-06-02 DIAGNOSIS — I774 Celiac artery compression syndrome: Secondary | ICD-10-CM | POA: Diagnosis not present

## 2015-06-02 NOTE — Addendum Note (Signed)
Addended by: Dorthula Rue L on: 06/02/2015 10:14 AM   Modules accepted: Orders

## 2015-06-02 NOTE — Telephone Encounter (Signed)
Phone call from pt's Physical Therapist.  Reported he was at her home doing an assessment before starting care.  Reported BP 200/80 and c/o intermittent ache left upper arm, that started approx. 30 min. ago.  Spoke with pt.  Questioned pt. about any shortness of breath?  Stated "I am shook up and nervous, so I am a little short of breath."  Denied chest pain.  Reported some nausea over past few days.  The Physical Therapist reported pt's. skin is warm/ dry.  Reported he notified pt's. PCP of above symptoms, and was advised to have pt. go to the ER.  Physical Therapist stated he was helping pt. to attempt to notify her daughter, to take her to the ER, but unsuccessful.  Encouraged pt. to call EMS if unable to reach her daughter.  Pt. Verb. Understanding.

## 2015-06-03 DIAGNOSIS — A4151 Sepsis due to Escherichia coli [E. coli]: Secondary | ICD-10-CM | POA: Diagnosis not present

## 2015-06-03 DIAGNOSIS — F419 Anxiety disorder, unspecified: Secondary | ICD-10-CM | POA: Diagnosis not present

## 2015-06-03 DIAGNOSIS — N12 Tubulo-interstitial nephritis, not specified as acute or chronic: Secondary | ICD-10-CM | POA: Diagnosis not present

## 2015-06-03 DIAGNOSIS — N390 Urinary tract infection, site not specified: Secondary | ICD-10-CM | POA: Diagnosis not present

## 2015-06-03 DIAGNOSIS — F329 Major depressive disorder, single episode, unspecified: Secondary | ICD-10-CM | POA: Diagnosis not present

## 2015-06-03 DIAGNOSIS — I10 Essential (primary) hypertension: Secondary | ICD-10-CM | POA: Diagnosis not present

## 2015-06-03 DIAGNOSIS — I774 Celiac artery compression syndrome: Secondary | ICD-10-CM | POA: Diagnosis not present

## 2015-06-03 DIAGNOSIS — I739 Peripheral vascular disease, unspecified: Secondary | ICD-10-CM | POA: Diagnosis not present

## 2015-06-03 DIAGNOSIS — I2510 Atherosclerotic heart disease of native coronary artery without angina pectoris: Secondary | ICD-10-CM | POA: Diagnosis not present

## 2015-06-04 DIAGNOSIS — I774 Celiac artery compression syndrome: Secondary | ICD-10-CM | POA: Diagnosis not present

## 2015-06-04 DIAGNOSIS — A4151 Sepsis due to Escherichia coli [E. coli]: Secondary | ICD-10-CM | POA: Diagnosis not present

## 2015-06-04 DIAGNOSIS — I739 Peripheral vascular disease, unspecified: Secondary | ICD-10-CM | POA: Diagnosis not present

## 2015-06-04 DIAGNOSIS — F419 Anxiety disorder, unspecified: Secondary | ICD-10-CM | POA: Diagnosis not present

## 2015-06-04 DIAGNOSIS — F329 Major depressive disorder, single episode, unspecified: Secondary | ICD-10-CM | POA: Diagnosis not present

## 2015-06-04 DIAGNOSIS — N390 Urinary tract infection, site not specified: Secondary | ICD-10-CM | POA: Diagnosis not present

## 2015-06-04 DIAGNOSIS — N12 Tubulo-interstitial nephritis, not specified as acute or chronic: Secondary | ICD-10-CM | POA: Diagnosis not present

## 2015-06-04 DIAGNOSIS — I10 Essential (primary) hypertension: Secondary | ICD-10-CM | POA: Diagnosis not present

## 2015-06-04 DIAGNOSIS — I2510 Atherosclerotic heart disease of native coronary artery without angina pectoris: Secondary | ICD-10-CM | POA: Diagnosis not present

## 2015-06-05 DIAGNOSIS — I10 Essential (primary) hypertension: Secondary | ICD-10-CM | POA: Diagnosis not present

## 2015-06-05 DIAGNOSIS — F329 Major depressive disorder, single episode, unspecified: Secondary | ICD-10-CM | POA: Diagnosis not present

## 2015-06-05 DIAGNOSIS — I774 Celiac artery compression syndrome: Secondary | ICD-10-CM | POA: Diagnosis not present

## 2015-06-05 DIAGNOSIS — I2510 Atherosclerotic heart disease of native coronary artery without angina pectoris: Secondary | ICD-10-CM | POA: Diagnosis not present

## 2015-06-05 DIAGNOSIS — I739 Peripheral vascular disease, unspecified: Secondary | ICD-10-CM | POA: Diagnosis not present

## 2015-06-05 DIAGNOSIS — A4151 Sepsis due to Escherichia coli [E. coli]: Secondary | ICD-10-CM | POA: Diagnosis not present

## 2015-06-05 DIAGNOSIS — F419 Anxiety disorder, unspecified: Secondary | ICD-10-CM | POA: Diagnosis not present

## 2015-06-05 DIAGNOSIS — N390 Urinary tract infection, site not specified: Secondary | ICD-10-CM | POA: Diagnosis not present

## 2015-06-05 DIAGNOSIS — N12 Tubulo-interstitial nephritis, not specified as acute or chronic: Secondary | ICD-10-CM | POA: Diagnosis not present

## 2015-06-08 DIAGNOSIS — N12 Tubulo-interstitial nephritis, not specified as acute or chronic: Secondary | ICD-10-CM | POA: Diagnosis not present

## 2015-06-08 DIAGNOSIS — I774 Celiac artery compression syndrome: Secondary | ICD-10-CM | POA: Diagnosis not present

## 2015-06-08 DIAGNOSIS — F419 Anxiety disorder, unspecified: Secondary | ICD-10-CM | POA: Diagnosis not present

## 2015-06-08 DIAGNOSIS — A4151 Sepsis due to Escherichia coli [E. coli]: Secondary | ICD-10-CM | POA: Diagnosis not present

## 2015-06-08 DIAGNOSIS — I739 Peripheral vascular disease, unspecified: Secondary | ICD-10-CM | POA: Diagnosis not present

## 2015-06-08 DIAGNOSIS — N390 Urinary tract infection, site not specified: Secondary | ICD-10-CM | POA: Diagnosis not present

## 2015-06-08 DIAGNOSIS — I10 Essential (primary) hypertension: Secondary | ICD-10-CM | POA: Diagnosis not present

## 2015-06-08 DIAGNOSIS — F329 Major depressive disorder, single episode, unspecified: Secondary | ICD-10-CM | POA: Diagnosis not present

## 2015-06-08 DIAGNOSIS — H5210 Myopia, unspecified eye: Secondary | ICD-10-CM | POA: Diagnosis not present

## 2015-06-08 DIAGNOSIS — I2510 Atherosclerotic heart disease of native coronary artery without angina pectoris: Secondary | ICD-10-CM | POA: Diagnosis not present

## 2015-06-08 DIAGNOSIS — H524 Presbyopia: Secondary | ICD-10-CM | POA: Diagnosis not present

## 2015-06-09 DIAGNOSIS — I739 Peripheral vascular disease, unspecified: Secondary | ICD-10-CM | POA: Diagnosis not present

## 2015-06-09 DIAGNOSIS — I10 Essential (primary) hypertension: Secondary | ICD-10-CM | POA: Diagnosis not present

## 2015-06-09 DIAGNOSIS — F419 Anxiety disorder, unspecified: Secondary | ICD-10-CM | POA: Diagnosis not present

## 2015-06-09 DIAGNOSIS — N390 Urinary tract infection, site not specified: Secondary | ICD-10-CM | POA: Diagnosis not present

## 2015-06-09 DIAGNOSIS — F329 Major depressive disorder, single episode, unspecified: Secondary | ICD-10-CM | POA: Diagnosis not present

## 2015-06-09 DIAGNOSIS — N12 Tubulo-interstitial nephritis, not specified as acute or chronic: Secondary | ICD-10-CM | POA: Diagnosis not present

## 2015-06-09 DIAGNOSIS — A4151 Sepsis due to Escherichia coli [E. coli]: Secondary | ICD-10-CM | POA: Diagnosis not present

## 2015-06-09 DIAGNOSIS — I774 Celiac artery compression syndrome: Secondary | ICD-10-CM | POA: Diagnosis not present

## 2015-06-09 DIAGNOSIS — I2510 Atherosclerotic heart disease of native coronary artery without angina pectoris: Secondary | ICD-10-CM | POA: Diagnosis not present

## 2015-06-11 DIAGNOSIS — I2510 Atherosclerotic heart disease of native coronary artery without angina pectoris: Secondary | ICD-10-CM | POA: Diagnosis not present

## 2015-06-11 DIAGNOSIS — F419 Anxiety disorder, unspecified: Secondary | ICD-10-CM | POA: Diagnosis not present

## 2015-06-11 DIAGNOSIS — I739 Peripheral vascular disease, unspecified: Secondary | ICD-10-CM | POA: Diagnosis not present

## 2015-06-11 DIAGNOSIS — N390 Urinary tract infection, site not specified: Secondary | ICD-10-CM | POA: Diagnosis not present

## 2015-06-11 DIAGNOSIS — I10 Essential (primary) hypertension: Secondary | ICD-10-CM | POA: Diagnosis not present

## 2015-06-11 DIAGNOSIS — A4151 Sepsis due to Escherichia coli [E. coli]: Secondary | ICD-10-CM | POA: Diagnosis not present

## 2015-06-11 DIAGNOSIS — F329 Major depressive disorder, single episode, unspecified: Secondary | ICD-10-CM | POA: Diagnosis not present

## 2015-06-11 DIAGNOSIS — I774 Celiac artery compression syndrome: Secondary | ICD-10-CM | POA: Diagnosis not present

## 2015-06-11 DIAGNOSIS — N12 Tubulo-interstitial nephritis, not specified as acute or chronic: Secondary | ICD-10-CM | POA: Diagnosis not present

## 2015-06-18 DIAGNOSIS — F419 Anxiety disorder, unspecified: Secondary | ICD-10-CM | POA: Diagnosis not present

## 2015-06-18 DIAGNOSIS — I774 Celiac artery compression syndrome: Secondary | ICD-10-CM | POA: Diagnosis not present

## 2015-06-18 DIAGNOSIS — F329 Major depressive disorder, single episode, unspecified: Secondary | ICD-10-CM | POA: Diagnosis not present

## 2015-06-18 DIAGNOSIS — N12 Tubulo-interstitial nephritis, not specified as acute or chronic: Secondary | ICD-10-CM | POA: Diagnosis not present

## 2015-06-18 DIAGNOSIS — I10 Essential (primary) hypertension: Secondary | ICD-10-CM | POA: Diagnosis not present

## 2015-06-18 DIAGNOSIS — N390 Urinary tract infection, site not specified: Secondary | ICD-10-CM | POA: Diagnosis not present

## 2015-06-18 DIAGNOSIS — I2510 Atherosclerotic heart disease of native coronary artery without angina pectoris: Secondary | ICD-10-CM | POA: Diagnosis not present

## 2015-06-18 DIAGNOSIS — A4151 Sepsis due to Escherichia coli [E. coli]: Secondary | ICD-10-CM | POA: Diagnosis not present

## 2015-06-18 DIAGNOSIS — I739 Peripheral vascular disease, unspecified: Secondary | ICD-10-CM | POA: Diagnosis not present

## 2015-06-18 NOTE — Patient Outreach (Signed)
[place] [facility]) Care Management  06/18/2015  Deanna [name] 11-17-1940 [id number]   Referral from [facility], assigned Sonda Rumble, RN to outreach for [place] Management services.  Thanks, [name]. [place], [place] Assistant Phone: [phone] Fax: [phone]

## 2015-06-23 DIAGNOSIS — I774 Celiac artery compression syndrome: Secondary | ICD-10-CM | POA: Diagnosis not present

## 2015-06-23 DIAGNOSIS — A4151 Sepsis due to Escherichia coli [E. coli]: Secondary | ICD-10-CM | POA: Diagnosis not present

## 2015-06-23 DIAGNOSIS — N12 Tubulo-interstitial nephritis, not specified as acute or chronic: Secondary | ICD-10-CM | POA: Diagnosis not present

## 2015-06-23 DIAGNOSIS — F329 Major depressive disorder, single episode, unspecified: Secondary | ICD-10-CM | POA: Diagnosis not present

## 2015-06-23 DIAGNOSIS — I739 Peripheral vascular disease, unspecified: Secondary | ICD-10-CM | POA: Diagnosis not present

## 2015-06-23 DIAGNOSIS — N390 Urinary tract infection, site not specified: Secondary | ICD-10-CM | POA: Diagnosis not present

## 2015-06-23 DIAGNOSIS — F419 Anxiety disorder, unspecified: Secondary | ICD-10-CM | POA: Diagnosis not present

## 2015-06-23 DIAGNOSIS — I10 Essential (primary) hypertension: Secondary | ICD-10-CM | POA: Diagnosis not present

## 2015-06-23 DIAGNOSIS — I2510 Atherosclerotic heart disease of native coronary artery without angina pectoris: Secondary | ICD-10-CM | POA: Diagnosis not present

## 2015-06-24 DIAGNOSIS — I774 Celiac artery compression syndrome: Secondary | ICD-10-CM | POA: Diagnosis not present

## 2015-06-24 DIAGNOSIS — I10 Essential (primary) hypertension: Secondary | ICD-10-CM | POA: Diagnosis not present

## 2015-06-24 DIAGNOSIS — F329 Major depressive disorder, single episode, unspecified: Secondary | ICD-10-CM | POA: Diagnosis not present

## 2015-06-24 DIAGNOSIS — N390 Urinary tract infection, site not specified: Secondary | ICD-10-CM | POA: Diagnosis not present

## 2015-06-24 DIAGNOSIS — A4151 Sepsis due to Escherichia coli [E. coli]: Secondary | ICD-10-CM | POA: Diagnosis not present

## 2015-06-24 DIAGNOSIS — F419 Anxiety disorder, unspecified: Secondary | ICD-10-CM | POA: Diagnosis not present

## 2015-06-24 DIAGNOSIS — I2510 Atherosclerotic heart disease of native coronary artery without angina pectoris: Secondary | ICD-10-CM | POA: Diagnosis not present

## 2015-06-24 DIAGNOSIS — N12 Tubulo-interstitial nephritis, not specified as acute or chronic: Secondary | ICD-10-CM | POA: Diagnosis not present

## 2015-06-24 DIAGNOSIS — I739 Peripheral vascular disease, unspecified: Secondary | ICD-10-CM | POA: Diagnosis not present

## 2015-06-29 ENCOUNTER — Other Ambulatory Visit: Payer: Self-pay
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
## 2015-07-03 ENCOUNTER — Ambulatory Visit: Payer: Self-pay

## 2015-07-03 ENCOUNTER — Other Ambulatory Visit: Payer: Self-pay

## 2015-07-03 DIAGNOSIS — I10 Essential (primary) hypertension: Secondary | ICD-10-CM
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
## 2015-07-06 ENCOUNTER — Other Ambulatory Visit: Payer: Self-pay | Admitting: *Deleted
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
## 2015-07-08 DIAGNOSIS — R4189 Other symptoms and signs involving cognitive functions and awareness: Secondary | ICD-10-CM | POA: Insufficient documentation

## 2015-07-09 ENCOUNTER — Other Ambulatory Visit: Payer: Self-pay | Admitting: *Deleted

## 2015-07-09 NOTE — Patient Outreach (Signed)
[place] [facility]) Care Management  07/09/2015  Deanna [name] 1940-09-18 [id number]   Assessment: Care coordination Referral received from telephonic care management coordinator Oletha Cruel) for further home evaluation and assessment. Call placed and spoke with patient very briefly. Care management coordinator introduced self and explained purpose of the call. Patient reports that [place] physical therapy is actively working with her since she is having a hard time walking. She states "it hurts to walk" since she fell and hurt her right hip. She mentioned using a walker with ambulation. Patient was in a rush to get off the phone since she was "in the middle of doing something",as stated. She went ahead and ask for care management coordinator's contact number and states "will call right back" at a more convenient time.   Plan: Will await for patient's return call. If unable to receive a call back, will schedule for next outreach call.  Andris Brothers A. Adisyn Ruscitti, BSN, RN-BC [place] Management Coordinator Cell: [phone]

## 2015-07-13 ENCOUNTER — Other Ambulatory Visit: Payer: Self-pay | Admitting: Pharmacist

## 2015-07-13 ENCOUNTER — Other Ambulatory Visit: Payer: Self-pay | Admitting: *Deleted

## 2015-07-13 ENCOUNTER — Encounter: Payer: Self-pay | Admitting: *Deleted

## 2015-07-13 NOTE — Patient Outreach (Signed)
[place] [facility]) Care Management  [place]   07/13/2015  Deanna [name] Nov 03, 1940 [id number]  Subjective: Deanna [name] is a 74 y.o. female who was referred to [place] for a medication review.   Objective:   Current Medications: Current Outpatient Prescriptions  Medication Sig Dispense Refill  . aspirin EC 81 MG tablet Take 81 mg by mouth daily.    . B Complex-C (B-COMPLEX WITH VITAMIN C) tablet Take 1 tablet by mouth daily.    . cholecalciferol (VITAMIN D) 1000 UNITS tablet Take 1,000 Units by mouth daily.    . ciprofloxacin (CIPRO) 500 MG tablet Take 1 tablet (500 mg total) by mouth 2 (two) times daily. 18 tablet 0  . clopidogrel (PLAVIX) 75 MG tablet Take 1 tablet (75 mg total) by mouth once. 30 tablet 4  . CRESTOR 10 MG tablet TAKE ONE TABLET BY MOUTH DAILY 30 tablet 6  . levothyroxine (SYNTHROID, LEVOTHROID) 200 MCG tablet Take 200 mcg by mouth daily before breakfast.     . LORazepam (ATIVAN) 1 MG tablet Take 0.5 tablets (0.5 mg total) by mouth as needed for anxiety. For aniexty (Patient taking differently: Take 0.5 mg by mouth as needed for anxiety. ) 30 tablet 0  . metoprolol tartrate (LOPRESSOR) 25 MG tablet Take 1 tablet (25 mg total) by mouth 2 (two) times daily. 60 tablet 0  . ranitidine (ZANTAC) 150 MG capsule Take 150 mg by mouth daily.    Marland Kitchen saccharomyces boulardii (FLORASTOR) 250 MG capsule Take 1 capsule (250 mg total) by mouth 2 (two) times daily. 30 capsule 0  . traMADol (ULTRAM) 50 MG tablet Take 50 mg by mouth every 12 (twelve) hours as needed for moderate pain.     . valsartan (DIOVAN) 80 MG tablet Take 80 mg by mouth every morning.      No current facility-administered medications for this visit.    Functional Status: In your present state of health, do you have any difficulty performing the following activities: 04/26/2015  Hearing? N  Vision? N  Difficulty concentrating or making decisions? Y  Walking or climbing stairs? N   Dressing or bathing? N  Doing errands, shopping? Y    Fall/Depression Screening: PHQ 2/9 Scores 07/03/2015  PHQ - 2 Score 0    Assessment: Drugs sorted by system:  Neurologic/Psychologic: lorazepam  Cardiovascular: aspirin, clopidogrel, rosuvastatin, metoprolol tartrate, valsartan  Pulmonary/Allergy: none noted  Gastrointestinal: ranitidine, saccharomyces boulardii  Endocrine: levothyroxine  Renal/Urologic: mirabegron  Infectious Diseases: none noted  Topical: none noted  Pain: tramadol  Vitamins/Minerals/Supplement: B-complex with vitamin C, cholecalciferol  Miscellaneous: none noted  Findings:  Duplications in therapy: none   Gaps in therapy: on aspirin, statin, beta blocker, and ACEi.   Medications to avoid in the elderly: lorazepam (but no recent script)  Drug interactions: none  Inappropriate dose: none  Other issues noted: none   Plan: 1. Medication review: no issues noted and no recommendations for any changes. Will close pharmacy consult.    Nicoletta Ba, PharmD, [employer] [facility] [phone]
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
## 2015-07-20 ENCOUNTER — Ambulatory Visit: Payer: Commercial Managed Care - HMO | Admitting: Internal Medicine

## 2015-07-24 ENCOUNTER — Encounter: Payer: Self-pay | Admitting: *Deleted

## 2015-07-24 ENCOUNTER — Other Ambulatory Visit: Payer: Self-pay | Admitting: *Deleted
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
## 2015-07-25 ENCOUNTER — Encounter: Payer: Self-pay | Admitting: *Deleted

## 2015-08-17 DIAGNOSIS — M160 Bilateral primary osteoarthritis of hip: Secondary | ICD-10-CM | POA: Insufficient documentation

## 2015-09-01 ENCOUNTER — Other Ambulatory Visit: Payer: Self-pay | Admitting: *Deleted

## 2015-09-01 NOTE — Patient Outreach (Signed)
[place] [facility]) Care Management  09/01/2015  Deanna [name] 1940/10/11 [id number]   Assessment: Routine home visit  75 year old female being seen for hypertension, further in home evaluation and assessment. Arrived at patient's apartment. Patient opened up the door and was apologetic that she is not ready for a home visit at this time. She states it is not a convenient time for a visit since she was not expecting anybody to pay her a visit. Explained to patient that an automated reminder message is usually received by patients couple of days prior to the appointment schedule to remind patient of upcoming [facility] visit. Date of appointment is usually written by care management coordinator on the [facility] calendar/ notebook provided to them on initial visit as well. Patient denies receiving a reminder message and states unable to recall where she put the [facility] calendar/ notebook given to her.  Patient told care management coordinator to reschedule appointment to another date. Care management coordinator asked patient of the date that would be most convenient for her to visit but she states to just give her a call to reschedule the visit.   Plan: Will call patient to reschedule appointment for routine home visit per her request.  Edwena Felty A. Richele Strand, BSN, RN-BC [place] Management Coordinator Cell: [phone]

## 2015-09-16 ENCOUNTER — Encounter: Payer: Self-pay | Admitting: *Deleted

## 2015-09-16 ENCOUNTER — Other Ambulatory Visit: Payer: Self-pay | Admitting: *Deleted
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
## 2015-09-17 ENCOUNTER — Encounter: Payer: Self-pay | Admitting: *Deleted

## 2015-09-25 ENCOUNTER — Encounter: Payer: Self-pay | Admitting: Internal Medicine

## 2015-09-25 ENCOUNTER — Ambulatory Visit (INDEPENDENT_AMBULATORY_CARE_PROVIDER_SITE_OTHER): Payer: Medicare HMO | Admitting: Internal Medicine

## 2015-09-25 VITALS — BP 104/76 | HR 68 | Ht 62.0 in | Wt 201.4 lb

## 2015-09-25 DIAGNOSIS — Z86010 Personal history of colonic polyps: Secondary | ICD-10-CM

## 2015-09-25 DIAGNOSIS — Z800 Family history of malignant neoplasm of digestive organs: Secondary | ICD-10-CM

## 2015-09-25 DIAGNOSIS — Z5181 Encounter for therapeutic drug level monitoring: Secondary | ICD-10-CM | POA: Diagnosis not present

## 2015-09-25 DIAGNOSIS — Z7902 Long term (current) use of antithrombotics/antiplatelets: Secondary | ICD-10-CM | POA: Diagnosis not present

## 2015-09-25 NOTE — Progress Notes (Signed)
HISTORY OF PRESENT ILLNESS:  Deanna [name] is a 75 y.o. female with multiple chronic medical problems including coronary artery disease status post myocardial infarction and coronary artery bypass grafting, on chronic Plavix therapy, hypothyroidism, morbid obesity, and peripheral vascular disease. She has not been seen since May 2010 when she underwent colonoscopy. There is a family history in her brother age 60s. The patient herself had 3 adenomas removed and moderate left-sided diverticulosis. Follow-up in 3 years recommended. She did receive recall letter but did not respond. She is feeling the need for evaluation at this time. The patient's cardiologist is Dr. Gwenlyn Found. Her GI review of systems is negative except for rare episodes of minor fecal incontinence. No bleeding. She was hospitalized for about 3 days in September with pneumonia. Has recovered nicely without residual effects. GI review of systems otherwise negative except for rare heartburn for which she takes ranitidine  REVIEW OF SYSTEMS:  All non-GI ROS negative except for hearing problems, urinary leakage, fatigue, visual change  Past Medical History  Diagnosis Date  . Hypertension   . Colon polyps   . Hemorrhoids   . Hypothyroidism   . GERD (gastroesophageal reflux disease)   . Anxiety   . Depression   . Myocardial infarction ([place])   . CAD (coronary artery disease)     followed by dr [name].  . Sinus drainage     took z-pack   finished yesterday  . Dysuria   . Atherosclerotic peripheral vascular disease ([place])   . Urticaria   . Vertigo, benign positional   . Peripheral arterial disease ([place])   . Pyelonephritis   . Community acquired pneumonia   . Sepsis ([place])   . Celiac artery stenosis ([place])   . Diverticulosis     Past Surgical History  Procedure Laterality Date  . Fem-fem bypass graft    . Coronary artery bypass graft    . Replacement total knee  05-2011  . Coronary angioplasty    . Carotid-subclavian bypass  graft  12/15/2011    Procedure: BYPASS GRAFT CAROTID-SUBCLAVIAN;  Surgeon: Serafina Mitchell, MD;  [facility]: [facility] OR;  Service: Vascular;  Laterality: Left;  Left Carotid subclavian bypass  . Joint replacement      Left knee  . Unilateral upper extremeity angiogram Left 11/15/2011    Procedure: UNILATERAL UPPER EXTREMEITY ANGIOGRAM;  Surgeon: Lorretta Harp, MD;  Location: [facility] CATH LAB;  Service: Cardiovascular;  Laterality: Left;  . Left heart catheterization with coronary/graft angiogram N/A 12/21/2011    Procedure: LEFT HEART CATHETERIZATION WITH Beatrix Fetters;  Surgeon: Leonie Man, MD;  Location: [facility] CATH LAB;  Service: Cardiovascular;  Laterality: N/A;  . Bilateral upper extremity angiogram N/A 09/11/2012    Procedure: BILATERAL UPPER EXTREMITY ANGIOGRAM;  Surgeon: Serafina Mitchell, MD;  Location: [facility] CATH LAB;  Service: Cardiovascular;  Laterality: N/A;  . Doppler echocardiography  05/27/2010, 09/17/2008    Mild Proximal septal thickening is noted. Left ventricular systolic functions is normal ejection fraction =>55%. the aortic valve appears to be mildly sclerotic   . Nm myocar perf ejection fraction  09/22/2009, 07/03/2007    the post stress myocardial perfusion images show a normal pattern of perfusion is all regions. The post-stress ejection fraction is 68 %. no significant wall motion abnormalities noted. This is a low risk scan.  . Carotid duplex doppler Bilateral 09/03/2012, 11/03/2011    Evidence of 40%-59% bilateral internal carotid artery stenosis; however, velocities may be underestimated due to calcific plaque with acoustic shadowing  which makes doppler interrogation difficult. patent left common carotid- subclavian artery bypass with turbulent flow noted at the anastomosis with velocities of 295 cm/s  . Holter monitor  01/21/2008    The predominant rhythm was normal sinus rhythm. Minimum heartrate of 63 bpm at +01:00, maximum heartrate of 105 bpm at + 10:35; and the average  heartrate of 75 bpm. Ventricular ectopic activity totaled 1270: Multifocal; 866-PVC's and 404-VEs               Social History ALEJAH [name]  reports that she quit smoking about 33 years ago. Her smoking use included Cigarettes. She has never used smokeless tobacco. She reports that she drinks alcohol. She reports that she does not use illicit drugs.  family history includes Colon cancer in her brother; Diabetes in her father and sister; Heart attack in her brother, father, mother, and sister; Heart disease in her brother, father, mother, and sister; Hyperlipidemia in her brother, father, and sister; Hypertension in her brother, father, and sister.  Allergies  Allergen Reactions  . Hydrocodone-Acetaminophen Other (See Comments)    unknown  . Vicodin [Hydrocodone-Acetaminophen] Other (See Comments)    unknown       PHYSICAL EXAMINATION: Vital signs: BP 104/76 mmHg  Pulse 68  Ht 5\' 2"  (1.575 m)  Wt 201 lb 6.4 oz (91.354 kg)  BMI 36.83 kg/m2  Constitutional: generally well-appearing, no acute distress Psychiatric: alert and oriented x3, cooperative Eyes: extraocular movements intact, anicteric, conjunctiva pink Mouth: oral pharynx moist, no lesions Neck: supple without thyromegaly; lymph: Deferred until colonoscopy no lymphadenopathy Cardiovascular: heart regular rate and rhythm, no murmur Lungs: clear to auscultation bilaterally Abdomen: soft, nontender, nondistended, no obvious ascites, no peritoneal signs, normal bowel sounds, no organomegaly Rectal: Extremities: no clubbing cyanosis or lower extremity edema bilaterally Skin: no lesions on visible extremities Neuro: No focal deficits. Normal DTRs. Cranial nerves intact  ASSESSMENT:  #1. Personal history of multiple adenomatous colon polyps. Overdue for surveillance #2. Family history of colon cancer in her brother age 30s #3. Multiple medical problems including coronary artery disease for which she is on chronic Plavix.  Multiple medical problems stable   PLAN:  #1. Surveillance colonoscopy. Appropriate candidate without contraindication. High risk given comorbidities and the need to address chronic antiplatelet therapy. Would like to hold Plavix 1 week prior to her procedure. We will confirm with her cardiologist the feasibility of this. She would continue on her aspirin throughout. She understands the pros and cons of adjusting Plavix therapy.The nature of the procedure, as well as the risks, benefits, and alternatives were carefully and thoroughly reviewed with the patient. Ample time for discussion and questions allowed. The patient understood, was satisfied, and agreed to proceed.

## 2015-09-25 NOTE — Patient Instructions (Signed)
I will call you in the next week or two to schedule your colonoscopy

## 2015-10-15 ENCOUNTER — Encounter: Payer: Self-pay | Admitting: *Deleted

## 2015-10-15 ENCOUNTER — Encounter: Payer: Self-pay | Admitting: Internal Medicine

## 2015-10-15 ENCOUNTER — Other Ambulatory Visit: Payer: Self-pay | Admitting: *Deleted

## 2015-10-15 VITALS — BP 132/66 | HR 75 | Resp 19

## 2015-10-15 DIAGNOSIS — I10 Essential (primary) hypertension: Secondary | ICD-10-CM

## 2015-10-15 NOTE — Patient Outreach (Addendum)
[facility]) Care Management   10/15/2015  LENETTE RAU 1941-07-01 [id number]  [name] is an 75 y.o. female  Subjective: Patient states "I've been doing good".  Objective: BP 132/66 mmHg  Pulse 75  Resp 19  SpO2 96%  Review of Systems  Constitutional: Negative.   HENT: Positive for hearing loss.   Eyes: Negative.        Wears eyeglasses  Respiratory: Negative for cough and shortness of breath.        Respirations even and unlabored Clear to auscultation  Cardiovascular: Negative.  Negative for leg swelling.       Regular rate and rhythm Unable to check blood pressure to left arm- inaccurate result per daughter/ patient  Gastrointestinal: Negative for nausea, vomiting and abdominal pain.       Abdomen obese, soft, non-tender Positive bowel sounds  Genitourinary: Positive for frequency.       Episodes of urine leakage  Musculoskeletal: Positive for joint pain.       History of hip pain History of fall  Skin: Negative.   Neurological: Negative.   Endo/Heme/Allergies: Negative.   Psychiatric/Behavioral: Positive for memory loss. The patient is nervous/anxious.        Very forgetful  Anxious- on Ativan    Physical Exam  Current Medications:   Current Outpatient Prescriptions  Medication Sig Dispense Refill  . amLODipine (NORVASC) 5 MG tablet Take 5 mg by mouth daily.    Marland Kitchen aspirin EC 81 MG tablet Take 81 mg by mouth daily.    . B Complex-C (B-COMPLEX WITH VITAMIN C) tablet Take 1 tablet by mouth daily.    . cholecalciferol (VITAMIN D) 1000 UNITS tablet Take 1,000 Units by mouth daily.    . clopidogrel (PLAVIX) 75 MG tablet Take 1 tablet (75 mg total) by mouth once. 30 tablet 4  . CRESTOR 10 MG tablet TAKE ONE TABLET BY MOUTH DAILY 30 tablet 6  . levothyroxine (SYNTHROID, LEVOTHROID) 200 MCG tablet Take 200 mcg by mouth daily before breakfast.     . LORazepam (ATIVAN) 1 MG tablet Take 0.5 tablets (0.5 mg total) by mouth as needed for anxiety.  For aniexty (Patient taking differently: Take 0.5 mg by mouth as needed for anxiety. ) 30 tablet 0  . metoprolol tartrate (LOPRESSOR) 25 MG tablet Take 1 tablet (25 mg total) by mouth 2 (two) times daily. 60 tablet 0  . mirabegron ER (MYRBETRIQ) 50 MG TB24 tablet Take 50 mg by mouth daily.    . ranitidine (ZANTAC) 150 MG capsule Take 150 mg by mouth daily.    . sertraline (ZOLOFT) 50 MG tablet Take 100 mg by mouth 2 (two) times daily.    . traMADol (ULTRAM) 50 MG tablet Take 50 mg by mouth every 12 (twelve) hours as needed for moderate pain.     . valsartan (DIOVAN) 80 MG tablet Take 80 mg by mouth every morning.     . saccharomyces boulardii (FLORASTOR) 250 MG capsule Take 1 capsule (250 mg total) by mouth 2 (two) times daily. (Patient not taking: Reported on 10/15/2015) 30 capsule 0   No current facility-administered medications for this visit.    Functional Status:   In your present state of health, do you have any difficulty performing the following activities: 10/15/2015 09/16/2015  Hearing? Tempie Donning  Vision? N N  Difficulty concentrating or making decisions? Tempie Donning  Walking or climbing stairs? Y Y  Dressing or bathing? N N  Doing errands, shopping? Darreld Mclean  Y  Preparing Food and eating ? N N  Using the Toilet? N N  In the past six months, have you accidently leaked urine? Y Y  Do you have problems with loss of bowel control? N N  Managing your Medications? Y N  Managing your Finances? Tempie Donning  Housekeeping or managing your Housekeeping? Tempie Donning    Fall/Depression Screening:    [facility] 2/9 Scores 10/15/2015 09/16/2015 07/13/2015 07/03/2015  PHQ - 2 Score '1 1 1 ' 0    Assessment:   Arrived at patient's first-level apartment. Patient and daughter were present during this visit, although daughter comes in and out of the visit since she mostly stayed in her room.   Patient informed care management coordinator that she cancelled Rheumatologist appointment on 2/28 since she is not hurting as much to her hips. Patient  states it is progressively much better now.  Patient had also cancelled her follow-up appointment with primary care provider on 2/27 due to not feeling well. Assisted patient to reschedule follow-up visit with primary care provider which is on 3/10 at 2:20 pm. Her colonoscopy is scheduled on 12/23/15. Appointment schedules reflected on her calendar so she will be able to remember it.  Patient had been reminded of her transportation benefit from [facility] if needed. Patient was also assisted in listing/ organizing names of providers and their phone numbers for easier access. List of health concerns to be discussed with primary care provider was made for patient to bring to her scheduled follow-up visit.   Patient has shared no issues or concerns of abuse at this time. Daughter drives patient to her doctor's appointments if she is able. Daughter also assists patient in managing housekeeping and does groceries most of the time per patient's report. Daughter assists patient in managing her medications due to patient being forgetful. Medications reviewed and noted that patient is missing Florastor. Encouraged patient to make provider aware that she had stopped using medication. Patient and daughter are aware of need to refill Lorazepam and Myrbetriq as well.  Due to memory issues, patient needed constant reminders to be on track. She was able to monitor her blood pressure once a week with results ranging from 132/66- 148/78.  Patient still unable to monitor weight inspite bold-letter note that was provided as her reminder. She states that she "just does not want seeing the results".  Patient continues to manage hypertension with medications, diet and exercise (walking). She had opted not to check her weight because she gets disappointed with the result as stated.  Patient denies being able to identify any other needs at present time. Patient agreed to be transitioned to [facility] for further disease  management.  She is encouraged to call [facility], care management coordinator and 24-hour nurse line as needed. Patient has contact information with her.    Plan: Will do case closure on patient as most goals have been met from community care management standpoint and no additional needs identified at this time. Will transition patient to health coach for further disease management.     THN CM Care Plan Problem One        Most Recent Value   Care Plan Problem One  lack of knowledge regarding hypertension   Role Documenting the Problem One  Care Management Coordinator   Care Plan for Problem One  Active   THN Long Term Goal (31-90 days)  patient will verbalize at least 3 ways of managing hypertension in the next 31 days  THN Long Term Goal Start Date  07/24/15   THN Long Term Goal Met Date  09/16/15 [Goal met-patient states taking medication,diet and exercises]   THN CM Short Term Goal #1 (0-30 days)  patient will monitor blood pressure at least once weekly and record in the next 30 days    THN CM Short Term Goal #1 Start Date  07/24/15   [facility] CM Short Term Goal #1 Met Date  09/16/15 [Not met-not done(forgotten),  3/9- Goal met- BP check weekly]   Interventions for Short Term Goal #1  confirm availability of blood pressure device, encourage patient to check blood pressure daily and record, THN calendar/notebook provided to record readings,  encourage to bring record to provider's follow-up appointment for review   THN CM Short Term Goal #2 (0-30 days)  patient will report performing exercises provided by physical therapy at least 3 times a week in the next 30 days   THN CM Short Term Goal #2 Start Date  07/24/15   [facility] CM Short Term Goal #2 Met Date  09/16/15 [Goal met- Physical therapy ended 08/04/15, excises encouraged]   THN CM Short Term Goal #3 (0-30 days)  patient will monitor weight at least once a week and record in the next 30 days   THN CM Short Term Goal #3 Start Date  07/24/15   [facility]  CM Short Term Goal #3 Met Date  09/16/15 [Not met- not done (forgotten)]    [facility] CM Care Plan Problem Two        Most Recent Value   Care Plan Problem Two  complains of hip pain/ discomfort with movements related to history of fall   Role Documenting the Problem Two  Care Management Coordinator   Care Plan for Problem Two  Active   THN Long Term Goal (31-90) days  patient will verbalize lesser pain to hips below 5 on the pain scale in the next 31 days   THN Long Term Goal Start Date  09/16/15   [facility] Long Term Goal Met Date  10/15/15 [Goal met- reports pain getting better ranging from 0- 3]   THN CM Short Term Goal #1 (0-30 days)  patient will attend follow-up appointment with Rheumatologist in the next 30 days    THN CM Short Term Goal #1 Start Date  09/16/15   [facility] CM Short Term Goal #1 Met Date   10/15/15 [not met- appointment cancelled- hip pain seems to be better]   THN CM Short Term Goal #2 (0-30 days)  patient will be able to attend follow-up appointment with primary care provider and discuss health concerns in the next 30 days   THN CM Short Term Goal #2 Start Date  10/15/15 [Appointment moved to 3/10 with PCP. CONTINUE with GOAL]   Interventions for Short Term Goal #2  assist patient to clarify appointment and reschedule appointment (patient cancelled prior appointment) with primary care provider's office,  assisted patient to reflect these appointments on her calendar,  encouraged patient to frequently look at her calendar to check for scheduled appointments,  notified daughter of rescheduled appointments to help patient keep track of the different doctors' appointments,  encourage and assisted patient to list down her health concerns (pain management, target BP, target weight,  memory loss- medication to substitute Aricept) and discuss with primary provider on upcoming visit         ADDENDUM:  UPDATE on Plan of Care:  Patient will not be transitioned to [facility], however,  Care  management coordinator will make a home visit next month to assess any additional care management needs and reinforce hypertension education to patient.    Jameriah Trotti A. Brianda Beitler, BSN, RN-BC [place] Management Coordinator Cell: [phone]

## 2015-10-16 ENCOUNTER — Encounter: Payer: Self-pay | Admitting: *Deleted

## 2015-11-04 DIAGNOSIS — Z87820 Personal history of traumatic brain injury: Secondary | ICD-10-CM | POA: Insufficient documentation

## 2015-11-17 ENCOUNTER — Encounter: Payer: Self-pay | Admitting: *Deleted

## 2015-11-17 ENCOUNTER — Other Ambulatory Visit: Payer: Self-pay | Admitting: *Deleted

## 2015-11-17 NOTE — Patient Outreach (Signed)
[facility]) Care Management   11/17/2015  Deanna [name] [birth year] [id number]  Deanna [name] is an 75 y.o. female  Subjective: Patient verbalized "been doing pretty good".  Patient reports being sore to back and hips with prolonged standing or walking. She states that "pain medication helps dull the soreness and prevents nagging pain". Patient reports recent steroid injection (ortho) to left thumb trigger which provides relief.  Objective: BP 146/72 mmHg  Pulse 81  Resp 18  SpO2 98%   Review of Systems  Constitutional: Negative.   HENT: Positive for hearing loss.        History of hearing impairment  Eyes: Negative.        Wears eyeglasses  Respiratory: Negative.  Negative for cough, sputum production, shortness of breath and wheezing.        Respirations even and unlabored Lung sounds clear to auscultation  Cardiovascular: Negative.  Negative for chest pain and leg swelling.       Regular rate and rhythm Inaccurate blood pressure readings to left arm per daughter/ patient  Gastrointestinal: Negative.  Negative for abdominal pain.       Obese Abdomen soft, non tender Bowel sounds present  Genitourinary: Positive for frequency.       Has urine leakage - on Myrbetriq  Musculoskeletal: Positive for back pain, joint pain and falls.       Chronic hip and back pain- felt when walking and standing too long History fall  Skin: Negative.   Neurological: Negative.   Endo/Heme/Allergies: Bruises/bleeds easily.  Psychiatric/Behavioral: Positive for memory loss. The patient is nervous/anxious.         depends on daughter due to memory problem Anxious at times- on Ativan    Physical Exam  Encounter Medications:   Outpatient Encounter Prescriptions as of 11/17/2015  Medication Sig Note  . amLODipine (NORVASC) 5 MG tablet Take 5 mg by mouth daily.   Marland Kitchen aspirin EC 81 MG tablet Take 81 mg by mouth daily.   . B Complex-C (B-COMPLEX WITH VITAMIN C) tablet Take 1  tablet by mouth daily.   . cholecalciferol (VITAMIN D) 1000 UNITS tablet Take 1,000 Units by mouth daily. 07/24/2015: Patient taking Vitamin D3 2000 iu daily per patient  . clopidogrel (PLAVIX) 75 MG tablet Take 1 tablet (75 mg total) by mouth once.   . CRESTOR 10 MG tablet TAKE ONE TABLET BY MOUTH DAILY   . levothyroxine (SYNTHROID, LEVOTHROID) 200 MCG tablet Take 200 mcg by mouth daily before breakfast.    . LORazepam (ATIVAN) 1 MG tablet Take 0.5 tablets (0.5 mg total) by mouth as needed for anxiety. For aniexty (Patient taking differently: Take 0.5 mg by mouth as needed for anxiety. )   . metoprolol tartrate (LOPRESSOR) 25 MG tablet Take 1 tablet (25 mg total) by mouth 2 (two) times daily.   . mirabegron ER (MYRBETRIQ) 50 MG TB24 tablet Take 50 mg by mouth daily.   . ranitidine (ZANTAC) 150 MG capsule Take 150 mg by mouth daily. 09/16/2015: Taking Zantac as needed per patient  . sertraline (ZOLOFT) 50 MG tablet Take 100 mg by mouth 2 (two) times daily.   . traMADol (ULTRAM) 50 MG tablet Take 50 mg by mouth every 12 (twelve) hours as needed for moderate pain.    . valsartan (DIOVAN) 80 MG tablet Take 80 mg by mouth every morning.    . saccharomyces boulardii (FLORASTOR) 250 MG capsule Take 1 capsule (250 mg total) by mouth 2 (two) times  daily. (Patient not taking: Reported on 10/15/2015) 10/15/2015: Per daughter and patient's report   No facility-administered encounter medications on file as of 11/17/2015.    Functional Status:   In your present state of health, do you have any difficulty performing the following activities: 10/15/2015 09/16/2015  Hearing? Deanna [name]  Vision? N N  Difficulty concentrating or making decisions? Deanna [name]  Walking or climbing stairs? Y Y  Dressing or bathing? N N  Doing errands, shopping? Deanna [name]  Preparing Food and eating ? N N  Using the Toilet? N N  In the past six months, have you accidently leaked urine? Y Y  Do you have problems with loss of bowel control? N N  Managing your  Medications? Y N  Managing your Finances? Deanna [name]  Housekeeping or managing your Housekeeping? Deanna [name]    Fall/Depression Screening:    [facility] 2/9 Scores 11/17/2015 10/15/2015 09/16/2015 07/13/2015 07/03/2015  PHQ - 2 Score _0 0    Assessment:   75 year old female seen today to assess any additional care management needs and reinforce hypertension education to patient. Arrived at patient's apartment (first- level). Patient is by herself since daughter stayed overnight with her friend per patient's report.   Deanna [name] states being able to follow-up with primary care provider on 3/10 with daughter accompanying her. She has a follow-up visit scheduled for 7/10 as reflected on her calendar. Patient reports that colonoscopy is scheduled on 12/23/15. Reviewed upcoming providers' appointments with patient and verified that schedules are listed on her calendar inorder to be reminded.Encouraged patient to review calendar regularly to avoid missing any scheduled appointments. Patient was also reminded of her transportation benefit from [facility] in case it is needed. Contact number for [employer] is with patient. Patient maintains contact names and list of providers with their business cards and phone numbers on [place] provided to her and recognized it being helpful.   Patient continues to depend on her daughter to do errands and for constant reminders to be on track due to her memory problem. She uses a small notebook to write down informations and things to do as her guide.  Patient denies any concerns/ issues of abuse at this time.  Patient's blood pressure results range from 130/60- 150/70's of what she can remember to record. Her weight today remains at 201 pounds. Large printed note maintained on her refrigerator door to serve as her reminder to monitor. Patient still prefers not to check weight regularly as stated.  EMMI video on Hypertension and Metabolic Syndrome: Hypertension, Prediabetes,  Hyperlipidemia were provided and viewed by patient and she made notes on her notebook. As of now, patient hesitates to attend exercise and nutrition classes. She states that she gets busy and gets enough exercise in walking around the house and doing house chores like: laundry, cleaning the house (dusting,cleaning the bathroom), cooking, changing linens/ making the bed.  However, patient was provided information on [employer] exercise class Paramedic) as well as information on Nutrition class provided by [facility] in case she decides and is ready to for it.  Patient denies being able to identify any additional needs at this time. Patient agreed to close case with most goals met and notify primary care provider of such. She was encouraged to call [facility], care management coordinator or 24-hour nurse line as needed and if future needs arise. Contact information with patient.    Plan:  Will do case closure on patient as most  goals have been met from community care management standpoint and no additional needs identified at this time.    THN CM Care Plan Problem One        Most Recent Value   Care Plan Problem One  lack of knowledge regarding hypertension   Role Documenting the Problem One  Care Management Coordinator   Care Plan for Problem One  Not Active   THN Long Term Goal (31-90 days)  patient will verbalize at least 3 ways of managing hypertension in the next 31 days    THN Long Term Goal Start Date  07/24/15   [facility] Long Term Goal Met Date  09/16/15 [Goal met-patient states taking medication,diet and exercises]   THN CM Short Term Goal #1 (0-30 days)  patient will monitor blood pressure at least once weekly and record in the next 30 days    THN CM Short Term Goal #1 Start Date  07/24/15   [facility] CM Short Term Goal #1 Met Date  09/16/15 [Not met-not done(forgotten),  3/9- Goal met- BP check weekly]   THN CM Short Term Goal #2 (0-30 days)  patient will report performing exercises provided by  physical therapy at least 3 times a week in the next 30 days   THN CM Short Term Goal #2 Start Date  07/24/15   [facility] CM Short Term Goal #2 Met Date  -- [Goal met- Physical therapy ended 08/04/15, excises encouraged]   THN CM Short Term Goal #3 (0-30 days)  patient will monitor weight at least once a week and record in the next 30 days   THN CM Short Term Goal #3 Start Date  07/24/15   [facility] CM Short Term Goal #3 Met Date  09/16/15 [Not met- not done (forgotten)]    [facility] CM Care Plan Problem Two        Most Recent Value   Care Plan Problem Two  complains of hip pain/ discomfort with movements related to history of fall   Role Documenting the Problem Two  Care Management Coordinator   Care Plan for Problem Two  Not Active   THN Long Term Goal (31-90) days  patient will verbalize lesser pain to hips below 5 on the pain scale in the next 31 days   THN Long Term Goal Start Date  09/16/15   [facility] Long Term Goal Met Date  10/15/15 [Goal met- reports pain getting better ranging from 0- 3]   THN CM Short Term Goal #1 (0-30 days)  patient will attend follow-up appointment with Rheumatologist in the next 30 days    THN CM Short Term Goal #1 Start Date  09/16/15   [facility] CM Short Term Goal #1 Met Date   10/15/15 [not met- appointment cancelled- hip pain seems to be better]   THN CM Short Term Goal #2 (0-30 days)  patient will be able to attend follow-up appointment with primary care provider and discuss health concerns in the next 30 days   THN CM Short Term Goal #2 Start Date  10/15/15 [Appointment moved to 3/10 with PCP. CONTINUE with GOAL]   THN CM Short Term Goal #2 Met Date  11/17/15 [Goal met seen by PCP (Dr. Luciana Axe) on 3/10 -daughter with her]      Edwena Felty A. Staria Birkhead, BSN, RN-BC [place] Management Coordinator Cell: [phone]

## 2015-11-18 ENCOUNTER — Encounter: Payer: Self-pay | Admitting: *Deleted

## 2015-11-18 ENCOUNTER — Encounter: Payer: Self-pay | Admitting: Surgery

## 2015-11-30 ENCOUNTER — Encounter (HOSPITAL_COMMUNITY): Payer: Medicare HMO

## 2015-11-30 ENCOUNTER — Ambulatory Visit (HOSPITAL_COMMUNITY): Admission: RE | Admit: 2015-11-30 | Payer: Medicare HMO | Source: Ambulatory Visit

## 2015-11-30 ENCOUNTER — Ambulatory Visit: Payer: Medicare HMO | Admitting: Surgery

## 2015-12-11 ENCOUNTER — Telehealth: Payer: Self-pay

## 2015-12-11 NOTE — Telephone Encounter (Signed)
  12/11/2015   RE: Deanna [name] DOB: August 09, 1940 MRN: [id number]   Dear Gwenlyn Found,    We have scheduled the above patient for an endoscopic procedure. Our records show that she is on anticoagulation therapy.   Please advise as to how long the patient may come off her therapy of Plavix prior to the procedure, which is scheduled for 12/23/2015.  Please fax back/ or route the completed form to [place] at [phone].   Sincerely,    Phillis Haggis

## 2015-12-14 ENCOUNTER — Ambulatory Visit (AMBULATORY_SURGERY_CENTER): Payer: Self-pay

## 2015-12-14 ENCOUNTER — Telehealth: Payer: Self-pay

## 2015-12-14 ENCOUNTER — Encounter: Payer: Self-pay | Admitting: Internal Medicine

## 2015-12-14 VITALS — Ht 59.5 in | Wt 204.6 lb

## 2015-12-14 DIAGNOSIS — Z860100 Personal history of colon polyps, unspecified: Secondary | ICD-10-CM

## 2015-12-14 MED ORDER — SUPREP BOWEL PREP KIT 17.5-3.13-1.6 GM/177ML PO SOLN: 1.0000 | Freq: Once | ORAL | Status: DC

## 2015-12-14 NOTE — Progress Notes (Signed)
No allergies to eggs or soy No past problems with anesthesia No home oxygen No diet pills  No internet

## 2015-12-14 NOTE — Telephone Encounter (Signed)
Received clearance for pt to have endoscopic procedure on 12/23/2015; pt has not been seen by Dr. Gwenlyn Found since 06/2014. Pt needs to setup 12 month f/u; before we can clear her

## 2015-12-17 NOTE — Telephone Encounter (Signed)
Spoke with [facility] who said that patient has not been seen by Dr. Gwenlyn Found in several years.  It is hard to see who, exactly, manages her coumadin.  [facility] left a message on both of patient's phone number to see if she could see Dr. Gwenlyn Found in the office tomorrow so she could get clearance.  Awaiting patient to call their office back.

## 2015-12-17 NOTE — Telephone Encounter (Signed)
Spoke with Freada Bergeron at Dr. Kennon Holter office to check on status of his response to anticoagulation letter.  She is going to check into it and call me back

## 2015-12-18 ENCOUNTER — Encounter: Payer: Self-pay | Admitting: *Deleted

## 2015-12-18 ENCOUNTER — Ambulatory Visit (INDEPENDENT_AMBULATORY_CARE_PROVIDER_SITE_OTHER): Payer: 59 | Admitting: Cardiovascular Disease

## 2015-12-18 ENCOUNTER — Encounter: Payer: Self-pay | Admitting: Cardiovascular Disease

## 2015-12-18 VITALS — BP 136/80 | HR 78 | Ht 61.5 in | Wt 203.0 lb

## 2015-12-18 DIAGNOSIS — E785 Hyperlipidemia, unspecified: Secondary | ICD-10-CM

## 2015-12-18 DIAGNOSIS — I6529 Occlusion and stenosis of unspecified carotid artery: Secondary | ICD-10-CM | POA: Diagnosis not present

## 2015-12-18 DIAGNOSIS — I10 Essential (primary) hypertension: Secondary | ICD-10-CM | POA: Diagnosis not present

## 2015-12-18 LAB — HEPATIC FUNCTION PANEL
ALK PHOS: 81 U/L (ref 33–130)
ALT: 20 U/L (ref 6–29)
AST: 21 U/L (ref 10–35)
Albumin: 4.6 g/dL (ref 3.6–5.1)
BILIRUBIN DIRECT: 0.2 mg/dL (ref <=0.2)
BILIRUBIN INDIRECT: 0.8 mg/dL (ref 0.2–1.2)
Total Bilirubin: 1 mg/dL (ref 0.2–1.2)
Total Protein: 7 g/dL (ref 6.1–8.1)

## 2015-12-18 LAB — LIPID PANEL
CHOL/HDL RATIO: 3.8 ratio (ref <=5.0)
CHOLESTEROL: 156 mg/dL (ref 125–200)
HDL: 41 mg/dL — AB (ref >=46)
LDL Cholesterol: 78 mg/dL (ref <130)
Triglycerides: 185 mg/dL — ABNORMAL HIGH (ref <150)
VLDL: 37 mg/dL — ABNORMAL HIGH (ref <30)

## 2015-12-18 NOTE — Telephone Encounter (Signed)
Clearance letter (in [facility]) faxed to [place] at [employer]. Fax number [phone]. Phone- [phone].

## 2015-12-18 NOTE — Telephone Encounter (Signed)
Spoke with patient to clarify that, per Dr. Gwenlyn Found, she could stop her Plavix today and hold until her colonoscopy scheduled 12/23/2015.  Patient acknowledged and understood

## 2015-12-18 NOTE — Progress Notes (Signed)
12/18/2015 Deanna [name]   1940-12-22  [id number]  Primary Physician Bartholome Bill, MD Primary Cardiologist: Lorretta Harp MD Renae Gloss   HPI:  Deanna [name] is a 75 year old moderately overweight divorced Caucasian female mother of one daughter who I last saw in the office 07/02/14. She was formerly a patient of Deanna [name]. I am assuming her care. I have the procedures on her in the past as well. Her primary care physician is Deanna [name]. She has a history of coronary artery disease status post anterior wall myocardial infarction in 1994 treated with LAD angioplasty by Deanna [name]. She had circumflex intervention the following year. Ultimately she required coronary bypass grafting in 1999 by Deanna [name] . 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 Other problems include to hypertension and hyperlipidemia. She does not smoke. She is not diabetic. She has a strong family history of heart disease. She denies chest pain, shortness of breath or claudication. She is back today for preprocedure clearance before colonoscopy next week.   Current Outpatient Prescriptions  Medication Sig Dispense Refill  . amLODipine (NORVASC) 5 MG tablet Take 5 mg by mouth daily.    Marland Kitchen aspirin EC 81 MG tablet Take 81 mg by mouth daily.    . B Complex-C (B-COMPLEX WITH VITAMIN C) tablet Take 1 tablet by mouth daily.    . cholecalciferol (VITAMIN D) 1000 UNITS tablet Take 1,000 Units by mouth daily.    . clopidogrel (PLAVIX) 75 MG tablet Take 1 tablet (75 mg total) by mouth once. 30 tablet 4  . CRESTOR 10 MG tablet TAKE ONE TABLET BY MOUTH DAILY 30 tablet 6  . levothyroxine (SYNTHROID, LEVOTHROID) 200 MCG tablet Take 200  mcg by mouth daily before breakfast.     . LORazepam (ATIVAN) 1 MG tablet Take 0.5 tablets (0.5 mg total) by mouth as needed for anxiety. For aniexty (Patient taking differently: Take 0.5 mg by mouth as needed for anxiety. ) 30 tablet 0  . metoprolol tartrate (LOPRESSOR) 25 MG tablet Take 1 tablet (25 mg total) by mouth 2 (two) times daily. 60 tablet 0  . mirabegron ER (MYRBETRIQ) 50 MG TB24 tablet Take 50 mg by mouth daily.    . sertraline (ZOLOFT) 100 MG tablet Take 0.5 tablets by mouth 2 (two) times daily.    . traMADol (ULTRAM) 50 MG tablet Take 50 mg by mouth every 12 (twelve) hours as needed for moderate pain.     . valsartan (DIOVAN) 80 MG tablet Take 80 mg by mouth every morning.      No current facility-administered medications for this visit.    Allergies  Allergen Reactions  . Hydrocodone-Acetaminophen Other (See Comments)    unknown  . Other     Donepezil; altered mood, anger  . Oxycodone     Toxic dementia  . Vicodin [Hydrocodone-Acetaminophen] Other (See Comments)    unknown    Social History   Social History  . Marital Status: Divorced    Spouse Name: N/A  . Number of Children: N/A  . Years of Education: N/A   Occupational History  . Not on file.   Social History Main Topics  . Smoking status: Former Smoker    Types: Cigarettes    Quit date: 11/27/1981  . Smokeless tobacco: Never  Used  . Alcohol Use: 0.0 oz/week    0 Standard drinks or equivalent per week     Comment: Rare;only twice yearly  . Drug Use: No  . Sexual Activity: No     Comment: 1st intercourse 74 yo-1 partner   Other Topics Concern  . Not on file   Social History Narrative     Review of Systems: General: negative for chills, fever, night sweats or weight changes.  Cardiovascular: negative for chest pain, dyspnea on exertion, edema, orthopnea, palpitations, paroxysmal nocturnal dyspnea or shortness of breath Dermatological: negative for rash Respiratory: negative for cough or  wheezing Urologic: negative for hematuria Abdominal: negative for nausea, vomiting, diarrhea, bright red blood per rectum, melena, or hematemesis Neurologic: negative for visual changes, syncope, or dizziness All other systems reviewed and are otherwise negative except as noted above.    Blood pressure 136/80, pulse 78, height 5' 1.5" (1.562 m), weight 203 lb (92.08 kg).  General appearance: alert and no distress Neck: no adenopathy, no JVD, supple, symmetrical, trachea midline, thyroid not enlarged, symmetric, no tenderness/mass/nodules and bilateral carotid and subclavian bruits Lungs: clear to auscultation bilaterally Heart: regular rate and rhythm, S1, S2 normal, no murmur, click, rub or gallop Extremities: extremities normal, atraumatic, no cyanosis or edema  EKG normal sinus rhythm at 78 without ST or T-wave changes. I personally reviewed this EKG  ASSESSMENT AND PLAN:   Essential hypertension History of hypertension blood pressure measured at 136/80. She is on amlodipine, metoprolol and Diovan. Continue current meds at current dosing  Coronary atherosclerosis History of coronary artery disease status post anterior wall microinfarcts in 1994 treated with LAD angioplasty by Deanna [name]. She had circumflex intervention the following year. Ultimately, she required coronary artery bypass grafting in 1999 by Deanna [name]. She denies chest pain or shortness of breath. She is scheduled for colonoscopy next week which I'm clearing her for low risk. She can stop her Plavix today and restart after her colonoscopy.  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 Ultimately she underwent left common carotid to subclavian bypass by Deanna [name]  which resulted in improvement in her symptoms. We will get follow-up carotid and upper extremity Doppler  studies.  Hyperlipidemia History of hyperlipidemia on statin therapy followed by her PCP. We will recheck a lipid and liver profile however      Lorretta Harp MD [facility], [facility] 12/18/2015 9:33 AM

## 2015-12-18 NOTE — Assessment & Plan Note (Signed)
History of hypertension blood pressure measured at 136/80. She is on amlodipine, metoprolol and Diovan. Continue current meds at current dosing

## 2015-12-18 NOTE — Assessment & Plan Note (Signed)
History of coronary artery disease status post anterior wall microinfarcts in 1994 treated with LAD angioplasty by Dr. [name]. She had circumflex intervention the following year. Ultimately, she required coronary artery bypass grafting in 1999 by Dr. Arlyce Dice. She denies chest pain or shortness of breath. She is scheduled for colonoscopy next week which I'm clearing her for low risk. She can stop her Plavix today and restart after her colonoscopy.

## 2015-12-18 NOTE — Assessment & Plan Note (Signed)
History of subclavian steal with high-grade calcified left subclavian artery stenosis that I demonstrated angiographically 11/15/11. Ultimately she underwent left common carotid to subclavian bypass by Dr. Trula Slade  which resulted in improvement in her symptoms. We will get follow-up carotid and upper extremity Doppler studies.

## 2015-12-18 NOTE — Assessment & Plan Note (Signed)
History of hyperlipidemia on statin therapy followed by her PCP. We will recheck a lipid and liver profile however

## 2015-12-18 NOTE — Patient Instructions (Addendum)
Medication Instructions:  Your physician recommends that you continue on your current medications as directed. Please refer to the Current Medication list given to you today.  STOP YOUR PLAVIX TODAY LEADING UP TO YOUR COLONOSCOPY NEXT WEEK. YOU ARE CLEARED FOR COLONOSCOPY AT LOW RISK.   Labwork: Your physician recommends that you return for lab work AT [facility]. The lab can be found on the FIRST FLOOR of out building in [address]   Testing/Procedures: Your physician has requested that you have a carotid duplex. This test is an ultrasound of the carotid arteries in your neck. It looks at blood flow through these arteries that supply the brain with blood. Allow one hour for this exam. There are no restrictions or special instructions.  Your physician has requested that you have a upper extremity arterial doppler- During this test, ultrasound is used to evaluate arterial blood flow in your upper arms. Allow approximately one hour for this exam.   Follow-Up: Your physician wants you to follow-up in: [place]. You will receive a reminder letter in the mail two months in advance. If you don't receive a letter, please call our office to schedule the follow-up appointment.    Any Other Special Instructions Will Be Listed Below (If Applicable).     If you need a refill on your cardiac medications before your next appointment, please call your pharmacy.

## 2015-12-23 ENCOUNTER — Ambulatory Visit (AMBULATORY_SURGERY_CENTER): Payer: 59 | Admitting: Internal Medicine

## 2015-12-23 ENCOUNTER — Encounter: Payer: Medicare HMO | Admitting: Internal Medicine

## 2015-12-23 ENCOUNTER — Encounter: Payer: Self-pay | Admitting: Internal Medicine

## 2015-12-23 VITALS — BP 132/45 | HR 67 | Temp 98.4°F | Resp 14 | Ht 59.0 in | Wt 204.0 lb

## 2015-12-23 DIAGNOSIS — Z86010 Personal history of colonic polyps: Secondary | ICD-10-CM | POA: Diagnosis present

## 2015-12-23 DIAGNOSIS — D125 Benign neoplasm of sigmoid colon: Secondary | ICD-10-CM | POA: Diagnosis not present

## 2015-12-23 MED ORDER — SODIUM CHLORIDE 0.9 % IV SOLN: 500.0000 mL | INTRAVENOUS | Status: DC

## 2015-12-23 NOTE — Op Note (Signed)
[facility] Patient Name: Deanna [name] Procedure Date: 12/23/2015 11:40 AM MRN: [id number] Endoscopist: Docia Chuck. Henrene Pastor , MD Age: 74 Referring MD:  Date of Birth: 01-Sep-1940 Gender: Female Procedure:                Colonoscopy, with cold snare polypectomy -1 Indications:              Surveillance: Personal history of adenomatous                            polyps on last colonoscopy > 5 years ago. Index                            examination (Dr. Velora Heckler) 2003 with unspecified                            polyps. Last examination 2010(Dr. Henrene Pastor) with 3                            small tubular adenomas. Brother with colon cancer                            in 29s. Overdue for follow-up Medicines:                Monitored Anesthesia Care Procedure:                Pre-Anesthesia Assessment:                           - Prior to the procedure, a History and Physical                            was performed, and patient medications and                            allergies were reviewed. The patient's tolerance of                            previous anesthesia was also reviewed. The risks                            and benefits of the procedure and the sedation                            options and risks were discussed with the patient.                            All questions were answered, and informed consent                            was obtained. Prior Anticoagulants: The patient has                            taken no previous anticoagulant or antiplatelet  agents. ASA Grade Assessment: II - A patient with                            mild systemic disease. After reviewing the risks                            and benefits, the patient was deemed in                            satisfactory condition to undergo the procedure.                           After obtaining informed consent, the colonoscope                            was passed under direct  vision. Throughout the                            procedure, the patient's blood pressure, pulse, and                            oxygen saturations were monitored continuously. The                            Model CF-HQ190L [id number]) scope was introduced                            through the anus and advanced to the the cecum,                            identified by appendiceal orifice and ileocecal                            valve. The ileocecal valve, appendiceal orifice,                            and rectum were photographed. The quality of the                            bowel preparation was excellent. The colonoscopy                            was performed without difficulty. The patient                            tolerated the procedure well. The bowel preparation                            used was SUPREP. Scope In: 11:42:40 AM Scope Out: 11:55:39 AM Scope Withdrawal Time: 0 hours 10 minutes 51 seconds  Total Procedure Duration: 0 hours 12 minutes 59 seconds  Findings:                 A 5 mm polyp was found in the sigmoid colon. The  polyp was removed with a cold snare. Resection and                            retrieval were complete.                           Multiple diverticula were found in the left colon                            and right colon.                           The exam was otherwise without abnormality on                            direct and retroflexion views. Complications:            No immediate complications. Estimated blood loss:                            None. Estimated Blood Loss:     Estimated blood loss: none. Impression:               - One 5 mm polyp in the sigmoid colon, removed with                            a cold snare. Resected and retrieved.                           - Diverticulosis in the left colon and in the right                            colon.                           - The examination was otherwise  normal on direct                            and retroflexion views. Recommendation:           - Repeat colonoscopy in 5 years for surveillance.                           - Patient has a contact number available for                            emergencies. The signs and symptoms of potential                            delayed complications were discussed with the                            patient. Return to normal activities tomorrow.                            Written discharge instructions were provided to the  patient.                           - Resume previous diet.                           - Continue present medications.                           - Await pathology results. Docia Chuck. Henrene Pastor, MD 12/23/2015 12:02:52 PM This report has been signed electronically. CC Letter to:             Tammy Arn Medal

## 2015-12-23 NOTE — Patient Instructions (Signed)
1 polyp removed and sent to pathology.  Await results for final recommendation. Diverticulosis   YOU HAD AN ENDOSCOPIC PROCEDURE TODAY AT THE [facility]:   Refer to the procedure report that was given to you for any specific questions about what was found during the examination.  If the procedure report does not answer your questions, please call your gastroenterologist to clarify.  If you requested that your care partner not be given the details of your procedure findings, then the procedure report has been included in a sealed envelope for you to review at your convenience later.  YOU SHOULD EXPECT: Some feelings of bloating in the abdomen. Passage of more gas than usual.  Walking can help get rid of the air that was put into your GI tract during the procedure and reduce the bloating. If you had a lower endoscopy (such as a colonoscopy or flexible sigmoidoscopy) you may notice spotting of blood in your stool or on the toilet paper. If you underwent a bowel prep for your procedure, you may not have a normal bowel movement for a few days.  Please Note:  You might notice some irritation and congestion in your nose or some drainage.  This is from the oxygen used during your procedure.  There is no need for concern and it should clear up in a day or so.  SYMPTOMS TO REPORT IMMEDIATELY:   Following lower endoscopy (colonoscopy or flexible sigmoidoscopy):  Excessive amounts of blood in the stool  Significant tenderness or worsening of abdominal pains  Swelling of the abdomen that is new, acute  Fever of 100F or higher    For urgent or emergent issues, a gastroenterologist can be reached at any hour by calling [phone].   DIET: Your first meal following the procedure should be a small meal and then it is ok to progress to your normal diet. Heavy or fried foods are harder to digest and may make you feel nauseous or bloated.  Likewise, meals heavy in dairy and vegetables can  increase bloating.  Drink plenty of fluids but you should avoid alcoholic beverages for 24 hours.  ACTIVITY:  You should plan to take it easy for the rest of today and you should NOT DRIVE or use heavy machinery until tomorrow (because of the sedation medicines used during the test).    FOLLOW UP: Our staff will call the number listed on your records the next business day following your procedure to check on you and address any questions or concerns that you may have regarding the information given to you following your procedure. If we do not reach you, we will leave a message.  However, if you are feeling well and you are not experiencing any problems, there is no need to return our call.  We will assume that you have returned to your regular daily activities without incident.  If any biopsies were taken you will be contacted by phone or by letter within the next 1-3 weeks.  Please call us at [phone] if you have not heard about the biopsies in 3 weeks.    SIGNATURES/CONFIDENTIALITY: You and/or your care partner have signed paperwork which will be entered into your electronic medical record.  These signatures attest to the fact that that the information above on your After Visit Summary has been reviewed and is understood.  Full responsibility of the confidentiality of this discharge information lies with you and/or your care-partner.

## 2015-12-23 NOTE — Progress Notes (Signed)
Called to room to assist during endoscopic procedure.  Patient ID and intended procedure confirmed with present staff. Received instructions for my participation in the procedure from the performing physician.  

## 2015-12-23 NOTE — Progress Notes (Signed)
A and Ox 3 Report to RN 

## 2015-12-24 ENCOUNTER — Telehealth: Payer: Self-pay

## 2015-12-24 NOTE — Telephone Encounter (Signed)
  Follow up Call-  Call back number 12/23/2015  Post procedure Call Back phone  # [phone]  Permission to leave phone message Yes     Patient questions:  Do you have a fever, pain , or abdominal swelling? No. Pain Score  0 *  Have you tolerated food without any problems? Yes.    Have you been able to return to your normal activities? Yes.    Do you have any questions about your discharge instructions: Diet   No. Medications  No. Follow up visit  No.  Do you have questions or concerns about your Care? No.  Actions: * If pain score is 4 or above: No action needed, pain <4.

## 2015-12-28 ENCOUNTER — Other Ambulatory Visit: Payer: Self-pay | Admitting: Cardiovascular Disease

## 2015-12-28 DIAGNOSIS — I771 Stricture of artery: Secondary | ICD-10-CM

## 2015-12-30 ENCOUNTER — Encounter: Payer: Self-pay | Admitting: Internal Medicine

## 2016-01-05 ENCOUNTER — Ambulatory Visit (HOSPITAL_COMMUNITY)
Admission: RE | Admit: 2016-01-05 | Discharge: 2016-01-05 | Disposition: A | Discharge status: Home / Self Care | Payer: Medicare HMO | Source: Ambulatory Visit | Attending: Cardiology | Admitting: Cardiology

## 2016-01-05 ENCOUNTER — Inpatient Hospital Stay (HOSPITAL_COMMUNITY): Admission: RE | Admit: 2016-01-05 | Payer: 59 | Source: Ambulatory Visit

## 2016-01-05 DIAGNOSIS — I6529 Occlusion and stenosis of unspecified carotid artery: Secondary | ICD-10-CM | POA: Diagnosis present

## 2016-01-05 DIAGNOSIS — K219 Gastro-esophageal reflux disease without esophagitis: Secondary | ICD-10-CM | POA: Insufficient documentation

## 2016-01-05 DIAGNOSIS — F329 Major depressive disorder, single episode, unspecified: Secondary | ICD-10-CM | POA: Insufficient documentation

## 2016-01-05 DIAGNOSIS — I10 Essential (primary) hypertension: Secondary | ICD-10-CM | POA: Insufficient documentation

## 2016-01-05 DIAGNOSIS — I6523 Occlusion and stenosis of bilateral carotid arteries: Secondary | ICD-10-CM

## 2016-01-05 DIAGNOSIS — I2510 Atherosclerotic heart disease of native coronary artery without angina pectoris: Secondary | ICD-10-CM | POA: Diagnosis not present

## 2016-01-05 DIAGNOSIS — F419 Anxiety disorder, unspecified: Secondary | ICD-10-CM | POA: Diagnosis not present

## 2016-02-25 DIAGNOSIS — M47816 Spondylosis without myelopathy or radiculopathy, lumbar region: Secondary | ICD-10-CM | POA: Insufficient documentation

## 2016-10-14 ENCOUNTER — Other Ambulatory Visit (HOSPITAL_COMMUNITY): Payer: Self-pay | Admitting: Psychiatry

## 2016-10-14 DIAGNOSIS — R413 Other amnesia: Secondary | ICD-10-CM

## 2016-11-03 DIAGNOSIS — H9190 Unspecified hearing loss, unspecified ear: Secondary | ICD-10-CM | POA: Insufficient documentation

## 2016-11-16 ENCOUNTER — Ambulatory Visit (HOSPITAL_COMMUNITY)
Admission: RE | Admit: 2016-11-16 | Discharge: 2016-11-16 | Disposition: A | Discharge status: Home / Self Care | Payer: Medicare HMO | Source: Ambulatory Visit | Attending: Psychiatry | Admitting: Psychiatry

## 2016-11-16 DIAGNOSIS — R413 Other amnesia: Secondary | ICD-10-CM

## 2016-11-16 DIAGNOSIS — G9389 Other specified disorders of brain: Secondary | ICD-10-CM | POA: Insufficient documentation

## 2016-11-16 DIAGNOSIS — Z87820 Personal history of traumatic brain injury: Secondary | ICD-10-CM | POA: Diagnosis present

## 2016-11-16 DIAGNOSIS — R4189 Other symptoms and signs involving cognitive functions and awareness: Secondary | ICD-10-CM | POA: Insufficient documentation

## 2016-11-22 DIAGNOSIS — Z9181 History of falling: Secondary | ICD-10-CM | POA: Insufficient documentation

## 2017-01-03 ENCOUNTER — Other Ambulatory Visit: Payer: Self-pay | Admitting: Cardiovascular Disease

## 2017-01-03 DIAGNOSIS — I6523 Occlusion and stenosis of bilateral carotid arteries: Secondary | ICD-10-CM

## 2017-01-11 ENCOUNTER — Ambulatory Visit (HOSPITAL_COMMUNITY)
Admission: RE | Admit: 2017-01-11 | Discharge: 2017-01-11 | Disposition: A | Discharge status: Home / Self Care | Payer: Medicare HMO | Source: Ambulatory Visit | Attending: Cardiology | Admitting: Cardiology

## 2017-01-11 DIAGNOSIS — E785 Hyperlipidemia, unspecified: Secondary | ICD-10-CM | POA: Diagnosis not present

## 2017-01-11 DIAGNOSIS — I6523 Occlusion and stenosis of bilateral carotid arteries: Secondary | ICD-10-CM

## 2017-01-11 DIAGNOSIS — I2510 Atherosclerotic heart disease of native coronary artery without angina pectoris: Secondary | ICD-10-CM | POA: Diagnosis not present

## 2017-01-11 DIAGNOSIS — Z87898 Personal history of other specified conditions: Secondary | ICD-10-CM | POA: Insufficient documentation

## 2017-02-02 ENCOUNTER — Other Ambulatory Visit: Payer: Self-pay | Admitting: Cardiovascular Disease

## 2017-02-02 DIAGNOSIS — I6529 Occlusion and stenosis of unspecified carotid artery: Secondary | ICD-10-CM

## 2017-07-04 ENCOUNTER — Other Ambulatory Visit: Payer: Self-pay

## 2017-07-04 DIAGNOSIS — M79609 Pain in unspecified limb: Secondary | ICD-10-CM

## 2017-08-09 ENCOUNTER — Ambulatory Visit: Payer: Medicare HMO | Admitting: Cardiovascular Disease

## 2017-08-14 ENCOUNTER — Other Ambulatory Visit: Payer: Self-pay | Admitting: *Deleted

## 2017-08-14 ENCOUNTER — Ambulatory Visit (INDEPENDENT_AMBULATORY_CARE_PROVIDER_SITE_OTHER)
Admission: RE | Admit: 2017-08-14 | Discharge: 2017-08-14 | Disposition: A | Discharge status: Home / Self Care | Payer: Medicare HMO | Source: Ambulatory Visit | Attending: Family | Admitting: Family

## 2017-08-14 ENCOUNTER — Encounter: Payer: Self-pay | Admitting: Surgery

## 2017-08-14 ENCOUNTER — Ambulatory Visit (HOSPITAL_COMMUNITY)
Admission: RE | Admit: 2017-08-14 | Discharge: 2017-08-14 | Disposition: A | Discharge status: Home / Self Care | Payer: Medicare HMO | Source: Ambulatory Visit | Attending: Surgery | Admitting: Surgery

## 2017-08-14 ENCOUNTER — Ambulatory Visit: Payer: Medicare HMO | Admitting: Surgery

## 2017-08-14 ENCOUNTER — Encounter: Payer: Self-pay | Admitting: *Deleted

## 2017-08-14 VITALS — BP 142/69 | HR 69 | Temp 97.0°F | Resp 16 | Ht 59.0 in | Wt 213.6 lb

## 2017-08-14 DIAGNOSIS — I724 Aneurysm of artery of lower extremity: Secondary | ICD-10-CM | POA: Diagnosis not present

## 2017-08-14 DIAGNOSIS — M79609 Pain in unspecified limb: Secondary | ICD-10-CM

## 2017-08-14 DIAGNOSIS — I739 Peripheral vascular disease, unspecified: Secondary | ICD-10-CM | POA: Diagnosis not present

## 2017-08-14 DIAGNOSIS — I70213 Atherosclerosis of native arteries of extremities with intermittent claudication, bilateral legs: Secondary | ICD-10-CM | POA: Diagnosis not present

## 2017-08-14 NOTE — Progress Notes (Signed)
Vascular and Vein Specialist of [place]  Patient name: Deanna [name] MRN: [id number] DOB: 1940/12/30 Sex: female   REASON FOR VISIT:    Follow up  Deanna [name]:    Deanna [name] is a 77 y.o. 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 This was done in the setting of dizziness and left arm numbness. Her postoperative course was complicated by a seroma which became infected and required drainage. Ultimately she recovered   She has a history of a femoral-femoral bypass graft by Dr. Arlyce Dice.  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  There is been no follow-up.  She is having difficulty with pain in her right hip as well as her back and is being sent for vascular evaluation.   PAST MEDICAL HISTORY:   Past Medical History:  Diagnosis Date  . Anxiety   . Atherosclerotic peripheral vascular disease ([place])   . CAD (coronary artery disease)    followed by dr [name].  . Celiac artery stenosis ([place])   . Colon polyps   . Community acquired pneumonia   . Depression   . Diverticulosis   . Dysuria   . GERD (gastroesophageal reflux disease)   . Hemorrhoids   . Hypertension   . Hypothyroidism   . Myocardial infarction ([place])   . Peripheral arterial disease ([place])   . Pyelonephritis   . Sepsis ([place])   . Sinus drainage    took z-pack   finished yesterday  . Urticaria   . Vertigo, benign positional      FAMILY HISTORY:   Family History  Problem Relation Age of Onset  . Heart attack Mother   . Heart disease Mother        before age 69  . Diabetes Father   . Heart disease Father        before age 2  . Hypertension Father   . Hyperlipidemia Father   . Heart attack Father   . Colon cancer Brother   . Heart attack Brother   . Hyperlipidemia Brother   . Hypertension Brother   . Heart disease Brother        before age 28  .  Diabetes Sister   . Heart disease Sister        before age 56  . Hyperlipidemia Sister   . Hypertension Sister   . Heart attack Sister     SOCIAL HISTORY:   Social History   Tobacco Use  . Smoking status: Former Smoker    Types: Cigarettes    Last attempt to quit: 11/27/1981    Years since quitting: 35.7  . Smokeless tobacco: Never Used  Substance Use Topics  . Alcohol use: Yes    Alcohol/week: 0.0 oz    Comment: Rare;only twice yearly     ALLERGIES:   Allergies  Allergen Reactions  . Hydrocodone-Acetaminophen Other (See Comments)    unknown  . Other     Donepezil; altered mood, anger  . Oxycodone     Toxic dementia  . Vicodin [Hydrocodone-Acetaminophen] Other (See Comments)    unknown     CURRENT MEDICATIONS:   Current Outpatient Medications  Medication Sig Dispense Refill  . amLODipine (NORVASC) 5 MG tablet Take 5 mg by mouth daily.    Marland Kitchen aspirin EC 81 MG tablet Take 81 mg by mouth daily.    . B Complex-C (B-COMPLEX WITH VITAMIN C) tablet Take  1 tablet by mouth daily.    . cholecalciferol (VITAMIN D) 1000 UNITS tablet Take 1,000 Units by mouth daily.    . clopidogrel (PLAVIX) 75 MG tablet Take 1 tablet (75 mg total) by mouth once. 30 tablet 4  . CRESTOR 10 MG tablet TAKE ONE TABLET BY MOUTH DAILY 30 tablet 6  . levothyroxine (SYNTHROID, LEVOTHROID) 200 MCG tablet Take 200 mcg by mouth daily before breakfast.     . LORazepam (ATIVAN) 1 MG tablet Take 0.5 tablets (0.5 mg total) by mouth as needed for anxiety. For aniexty (Patient taking differently: Take 0.5 mg by mouth as needed for anxiety. ) 30 tablet 0  . metoprolol tartrate (LOPRESSOR) 25 MG tablet Take 1 tablet (25 mg total) by mouth 2 (two) times daily. 60 tablet 0  . mirabegron ER (MYRBETRIQ) 50 MG TB24 tablet Take 50 mg by mouth daily.    . sertraline (ZOLOFT) 100 MG tablet Take 0.5 tablets by mouth 2 (two) times daily.    . traMADol (ULTRAM) 50 MG tablet Take 50 mg by mouth every 12 (twelve) hours as  needed for moderate pain.     . valsartan (DIOVAN) 80 MG tablet Take 80 mg by mouth every morning.      No current facility-administered medications for this visit.     REVIEW OF SYSTEMS:   [X]  denotes positive finding, [ ]  denotes negative finding Cardiac  Comments:  Chest pain or chest pressure:    Shortness of breath upon exertion:    Short of breath when lying flat:    Irregular heart rhythm:        Vascular    Pain in calf, thigh, or hip brought on by ambulation: x   Pain in feet at night that wakes you up from your sleep:     Blood clot in your veins:    Leg swelling:         Pulmonary    Oxygen at home:    Productive cough:     Wheezing:         Neurologic    Sudden weakness in arms or legs:     Sudden numbness in arms or legs:     Sudden onset of difficulty speaking or slurred speech:    Temporary loss of vision in one eye:     Problems with dizziness:         Gastrointestinal    Blood in stool:     Vomited blood:         Genitourinary    Burning when urinating:     Blood in urine:        Psychiatric    Major depression:         Hematologic    Bleeding problems:    Problems with blood clotting too easily:        Skin    Rashes or ulcers:        Constitutional    Fever or chills:      PHYSICAL EXAM:   There were no vitals filed for this visit.  GENERAL: The patient is a well-nourished female, in no acute distress. The vital signs are documented above. CARDIAC: There is a regular rate and rhythm.  VASCULAR: Pulsatile mass in the midportion of the femoral femoral graft PULMONARY: Non-labored respirations ABDOMEN: Soft and non-tender with normal pitched bowel sounds.  MUSCULOSKELETAL: There are no major deformities or cyanosis. NEUROLOGIC: No focal weakness or paresthesias are detected. SKIN: There are no ulcers  or rashes noted. PSYCHIATRIC: The patient has a normal affect.  STUDIES:   I have reviewed her ultrasound studies.  There is a  pseudoaneurysm in the mid graft measuring 2.7 x 3.7.  There is also a anastomotic stenosis in the distal left to right femoral-femoral bypass graft  MEDICAL ISSUES:   I discussed the ultrasound findings with the patient.  I have recommended that we proceed with angiography via a left femoral/or graft access and evaluation of the right leg and distal anastomotic stenosis.  This would be treated if necessary.  I am also considering treating her pseudoaneurysm with a covered stent placement.  I did discuss that she may require operative repair of the pseudoaneurysm.  This procedure has been scheduled for Tuesday, January 15.    Annamarie Major, MD [facility] and Vein Specialists of [facility] [phone] Pager [phone]

## 2017-08-14 NOTE — H&P (View-Only) (Signed)
Vascular and Vein Specialist of [place]  Patient name: Deanna [name] MRN: [id number] DOB: [birth year] Sex: female   REASON FOR VISIT:    Follow up  HISOTRY OF PRESENT ILLNESS:    Deanna [name] is a 77 y.o. 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 This was done in the setting of dizziness and left arm numbness. Her postoperative course was complicated by a seroma which became infected and required drainage. Ultimately she recovered   She has a history of a femoral-femoral bypass graft by Dr. Arlyce Dice.  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  There is been no follow-up.  She is having difficulty with pain in her right hip as well as her back and is being sent for vascular evaluation.   PAST MEDICAL HISTORY:   Past Medical History:  Diagnosis Date  . Anxiety   . Atherosclerotic peripheral vascular disease ([place])   . CAD (coronary artery disease)    followed by dr [name].  . Celiac artery stenosis ([place])   . Colon polyps   . Community acquired pneumonia   . Depression   . Diverticulosis   . Dysuria   . GERD (gastroesophageal reflux disease)   . Hemorrhoids   . Hypertension   . Hypothyroidism   . Myocardial infarction ([place])   . Peripheral arterial disease ([place])   . Pyelonephritis   . Sepsis ([place])   . Sinus drainage    took z-pack   finished yesterday  . Urticaria   . Vertigo, benign positional      FAMILY HISTORY:   Family History  Problem Relation Age of Onset  . Heart attack Mother   . Heart disease Mother        before age 27  . Diabetes Father   . Heart disease Father        before age 33  . Hypertension Father   . Hyperlipidemia Father   . Heart attack Father   . Colon cancer Brother   . Heart attack Brother   . Hyperlipidemia Brother   . Hypertension Brother   . Heart disease Brother        before age 32  .  Diabetes Sister   . Heart disease Sister        before age 61  . Hyperlipidemia Sister   . Hypertension Sister   . Heart attack Sister     SOCIAL HISTORY:   Social History   Tobacco Use  . Smoking status: Former Smoker    Types: Cigarettes    Last attempt to quit: 11/27/1981    Years since quitting: 35.7  . Smokeless tobacco: Never Used  Substance Use Topics  . Alcohol use: Yes    Alcohol/week: 0.0 oz    Comment: Rare;only twice yearly     ALLERGIES:   Allergies  Allergen Reactions  . Hydrocodone-Acetaminophen Other (See Comments)    unknown  . Other     Donepezil; altered mood, anger  . Oxycodone     Toxic dementia  . Vicodin [Hydrocodone-Acetaminophen] Other (See Comments)    unknown     CURRENT MEDICATIONS:   Current Outpatient Medications  Medication Sig Dispense Refill  . amLODipine (NORVASC) 5 MG tablet Take 5 mg by mouth daily.    Marland Kitchen aspirin EC 81 MG tablet Take 81 mg by mouth daily.    . B Complex-C (B-COMPLEX WITH VITAMIN C) tablet Take  1 tablet by mouth daily.    . cholecalciferol (VITAMIN D) 1000 UNITS tablet Take 1,000 Units by mouth daily.    . clopidogrel (PLAVIX) 75 MG tablet Take 1 tablet (75 mg total) by mouth once. 30 tablet 4  . CRESTOR 10 MG tablet TAKE ONE TABLET BY MOUTH DAILY 30 tablet 6  . levothyroxine (SYNTHROID, LEVOTHROID) 200 MCG tablet Take 200 mcg by mouth daily before breakfast.     . LORazepam (ATIVAN) 1 MG tablet Take 0.5 tablets (0.5 mg total) by mouth as needed for anxiety. For aniexty (Patient taking differently: Take 0.5 mg by mouth as needed for anxiety. ) 30 tablet 0  . metoprolol tartrate (LOPRESSOR) 25 MG tablet Take 1 tablet (25 mg total) by mouth 2 (two) times daily. 60 tablet 0  . mirabegron ER (MYRBETRIQ) 50 MG TB24 tablet Take 50 mg by mouth daily.    . sertraline (ZOLOFT) 100 MG tablet Take 0.5 tablets by mouth 2 (two) times daily.    . traMADol (ULTRAM) 50 MG tablet Take 50 mg by mouth every 12 (twelve) hours as  needed for moderate pain.     . valsartan (DIOVAN) 80 MG tablet Take 80 mg by mouth every morning.      No current facility-administered medications for this visit.     REVIEW OF SYSTEMS:   [X]  denotes positive finding, [ ]  denotes negative finding Cardiac  Comments:  Chest pain or chest pressure:    Shortness of breath upon exertion:    Short of breath when lying flat:    Irregular heart rhythm:        Vascular    Pain in calf, thigh, or hip brought on by ambulation: x   Pain in feet at night that wakes you up from your sleep:     Blood clot in your veins:    Leg swelling:         Pulmonary    Oxygen at home:    Productive cough:     Wheezing:         Neurologic    Sudden weakness in arms or legs:     Sudden numbness in arms or legs:     Sudden onset of difficulty speaking or slurred speech:    Temporary loss of vision in one eye:     Problems with dizziness:         Gastrointestinal    Blood in stool:     Vomited blood:         Genitourinary    Burning when urinating:     Blood in urine:        Psychiatric    Major depression:         Hematologic    Bleeding problems:    Problems with blood clotting too easily:        Skin    Rashes or ulcers:        Constitutional    Fever or chills:      PHYSICAL EXAM:   There were no vitals filed for this visit.  GENERAL: The patient is a well-nourished female, in no acute distress. The vital signs are documented above. CARDIAC: There is a regular rate and rhythm.  VASCULAR: Pulsatile mass in the midportion of the femoral femoral graft PULMONARY: Non-labored respirations ABDOMEN: Soft and non-tender with normal pitched bowel sounds.  MUSCULOSKELETAL: There are no major deformities or cyanosis. NEUROLOGIC: No focal weakness or paresthesias are detected. SKIN: There are no ulcers  or rashes noted. PSYCHIATRIC: The patient has a normal affect.  STUDIES:   I have reviewed her ultrasound studies.  There is a  pseudoaneurysm in the mid graft measuring 2.7 x 3.7.  There is also a anastomotic stenosis in the distal left to right femoral-femoral bypass graft  MEDICAL ISSUES:   I discussed the ultrasound findings with the patient.  I have recommended that we proceed with angiography via a left femoral/or graft access and evaluation of the right leg and distal anastomotic stenosis.  This would be treated if necessary.  I am also considering treating her pseudoaneurysm with a covered stent placement.  I did discuss that she may require operative repair of the pseudoaneurysm.  This procedure has been scheduled for Tuesday, January 15.    Annamarie Major, MD Vascular and Vein Specialists of [facility] [phone] Pager [phone]

## 2017-08-22 ENCOUNTER — Encounter (HOSPITAL_COMMUNITY)
Admission: RE | Disposition: A | Discharge status: Home / Self Care | Payer: Self-pay | Source: Ambulatory Visit | Attending: Surgery

## 2017-08-22 ENCOUNTER — Ambulatory Visit (HOSPITAL_COMMUNITY)
Admission: RE | Admit: 2017-08-22 | Discharge: 2017-08-22 | Disposition: A | Discharge status: Home / Self Care | Payer: Medicare HMO | Source: Ambulatory Visit | Attending: Surgery | Admitting: Surgery

## 2017-08-22 DIAGNOSIS — I2510 Atherosclerotic heart disease of native coronary artery without angina pectoris: Secondary | ICD-10-CM | POA: Insufficient documentation

## 2017-08-22 DIAGNOSIS — Y832 Surgical operation with anastomosis, bypass or graft as the cause of abnormal reaction of the patient, or of later complication, without mention of misadventure at the time of the procedure: Secondary | ICD-10-CM | POA: Insufficient documentation

## 2017-08-22 DIAGNOSIS — Z7902 Long term (current) use of antithrombotics/antiplatelets: Secondary | ICD-10-CM | POA: Insufficient documentation

## 2017-08-22 DIAGNOSIS — I739 Peripheral vascular disease, unspecified: Secondary | ICD-10-CM | POA: Insufficient documentation

## 2017-08-22 DIAGNOSIS — Z7982 Long term (current) use of aspirin: Secondary | ICD-10-CM | POA: Diagnosis not present

## 2017-08-22 DIAGNOSIS — I10 Essential (primary) hypertension: Secondary | ICD-10-CM | POA: Diagnosis not present

## 2017-08-22 DIAGNOSIS — Z888 Allergy status to other drugs, medicaments and biological substances status: Secondary | ICD-10-CM | POA: Diagnosis not present

## 2017-08-22 DIAGNOSIS — E039 Hypothyroidism, unspecified: Secondary | ICD-10-CM | POA: Insufficient documentation

## 2017-08-22 DIAGNOSIS — Z885 Allergy status to narcotic agent status: Secondary | ICD-10-CM | POA: Diagnosis not present

## 2017-08-22 DIAGNOSIS — F329 Major depressive disorder, single episode, unspecified: Secondary | ICD-10-CM | POA: Insufficient documentation

## 2017-08-22 DIAGNOSIS — I252 Old myocardial infarction: Secondary | ICD-10-CM | POA: Insufficient documentation

## 2017-08-22 DIAGNOSIS — T82858A Stenosis of vascular prosthetic devices, implants and grafts, initial encounter: Secondary | ICD-10-CM | POA: Insufficient documentation

## 2017-08-22 DIAGNOSIS — F419 Anxiety disorder, unspecified: Secondary | ICD-10-CM | POA: Diagnosis not present

## 2017-08-22 DIAGNOSIS — Z79899 Other long term (current) drug therapy: Secondary | ICD-10-CM | POA: Insufficient documentation

## 2017-08-22 DIAGNOSIS — I70201 Unspecified atherosclerosis of native arteries of extremities, right leg: Secondary | ICD-10-CM | POA: Diagnosis not present

## 2017-08-22 DIAGNOSIS — Z87891 Personal history of nicotine dependence: Secondary | ICD-10-CM | POA: Diagnosis not present

## 2017-08-22 HISTORY — PX: PERIPHERAL VASCULAR INTERVENTION: CATH118257

## 2017-08-22 HISTORY — PX: ABDOMINAL AORTOGRAM W/LOWER EXTREMITY: CATH118223

## 2017-08-22 LAB — POCT ACTIVATED CLOTTING TIME
Activated Clotting Time: 268 seconds
Activated Clotting Time: 213 seconds
Activated Clotting Time: 180 seconds

## 2017-08-22 LAB — POCT I-STAT, CHEM 8
BUN: 25 mg/dL — AB (ref 6–20)
CALCIUM ION: 1.22 mmol/L (ref 1.15–1.40)
CHLORIDE: 103 mmol/L (ref 101–111)
CREATININE: 0.9 mg/dL (ref 0.44–1.00)
GLUCOSE: 112 mg/dL — AB (ref 65–99)
HCT: 43 % (ref 36.0–46.0)
Hemoglobin: 14.6 g/dL (ref 12.0–15.0)
Potassium: 4.6 mmol/L (ref 3.5–5.1)
Sodium: 137 mmol/L (ref 135–145)
TCO2: 24 mmol/L (ref 22–32)

## 2017-08-22 SURGERY — ABDOMINAL AORTOGRAM W/LOWER EXTREMITY
Anesthesia: LOCAL

## 2017-08-22 MED ORDER — FENTANYL CITRATE (PF) 100 MCG/2ML IJ SOLN
INTRAMUSCULAR | Status: DC | PRN
  Administered 2017-08-22 (×4): 25 ug via INTRAVENOUS

## 2017-08-22 MED ORDER — CLOPIDOGREL BISULFATE 75 MG PO TABS: 75.0000 mg | ORAL_TABLET | Freq: Every day | ORAL | Status: DC

## 2017-08-22 MED ORDER — CLOPIDOGREL BISULFATE 75 MG PO TABS: 75.0000 mg | ORAL_TABLET | Freq: Every day | ORAL | 11 refills | Status: DC

## 2017-08-22 MED ORDER — CLOPIDOGREL BISULFATE 300 MG PO TABS
300.0000 mg | ORAL_TABLET | Freq: Once | ORAL | Status: AC
  Administered 2017-08-22: 300 mg via ORAL

## 2017-08-22 MED ORDER — IODIXANOL 320 MG/ML IV SOLN
INTRAVENOUS | Status: DC | PRN
  Administered 2017-08-22: 150 mL via INTRA_ARTERIAL

## 2017-08-22 MED ORDER — HEPARIN SODIUM (PORCINE) 1000 UNIT/ML IJ SOLN
INTRAMUSCULAR | Status: DC | PRN
  Administered 2017-08-22: 10000 [IU] via INTRAVENOUS

## 2017-08-22 MED ORDER — HEPARIN (PORCINE) IN NACL 2-0.9 UNIT/ML-% IJ SOLN
INTRAMUSCULAR | Status: AC | PRN
  Administered 2017-08-22: 1000 mL via INTRA_ARTERIAL

## 2017-08-22 MED ORDER — HEPARIN (PORCINE) IN NACL 2-0.9 UNIT/ML-% IJ SOLN
INTRAMUSCULAR | Status: AC
  Filled 2017-08-22: qty 1000

## 2017-08-22 MED ORDER — HYDRALAZINE HCL 20 MG/ML IJ SOLN
INTRAMUSCULAR | Status: AC
  Filled 2017-08-22: qty 1

## 2017-08-22 MED ORDER — FENTANYL CITRATE (PF) 100 MCG/2ML IJ SOLN
INTRAMUSCULAR | Status: AC
  Filled 2017-08-22: qty 2

## 2017-08-22 MED ORDER — SODIUM CHLORIDE 0.9 % WEIGHT BASED INFUSION: 1.0000 mL/kg/h | INTRAVENOUS | Status: DC

## 2017-08-22 MED ORDER — MIDAZOLAM HCL 2 MG/2ML IJ SOLN
INTRAMUSCULAR | Status: AC
  Filled 2017-08-22: qty 2

## 2017-08-22 MED ORDER — CLOPIDOGREL BISULFATE 300 MG PO TABS
ORAL_TABLET | ORAL | Status: AC
  Administered 2017-08-22: 300 mg via ORAL
  Filled 2017-08-22: qty 1

## 2017-08-22 MED ORDER — SODIUM CHLORIDE 0.9 % IV SOLN
INTRAVENOUS | Status: DC
  Administered 2017-08-22: 09:00:00 via INTRAVENOUS

## 2017-08-22 MED ORDER — MORPHINE SULFATE (PF) 4 MG/ML IV SOLN
INTRAVENOUS | Status: AC
  Filled 2017-08-22: qty 1

## 2017-08-22 MED ORDER — ASPIRIN EC 81 MG PO TBEC
81.0000 mg | DELAYED_RELEASE_TABLET | Freq: Every day | ORAL | Status: DC
  Administered 2017-08-22: 81 mg via ORAL
  Filled 2017-08-22: qty 1

## 2017-08-22 MED ORDER — LIDOCAINE HCL (PF) 1 % IJ SOLN
INTRAMUSCULAR | Status: DC | PRN
  Administered 2017-08-22: 15 mL

## 2017-08-22 MED ORDER — LABETALOL HCL 5 MG/ML IV SOLN: 10.0000 mg | INTRAVENOUS | Status: DC | PRN

## 2017-08-22 MED ORDER — HEPARIN SODIUM (PORCINE) 1000 UNIT/ML IJ SOLN
INTRAMUSCULAR | Status: AC
  Filled 2017-08-22: qty 1

## 2017-08-22 MED ORDER — SODIUM CHLORIDE 0.9% FLUSH: 3.0000 mL | Freq: Two times a day (BID) | INTRAVENOUS | Status: DC

## 2017-08-22 MED ORDER — SODIUM CHLORIDE 0.9 % IV SOLN: 250.0000 mL | INTRAVENOUS | Status: DC | PRN

## 2017-08-22 MED ORDER — SODIUM CHLORIDE 0.9% FLUSH: 3.0000 mL | INTRAVENOUS | Status: DC | PRN

## 2017-08-22 MED ORDER — MORPHINE SULFATE (PF) 10 MG/ML IV SOLN
2.0000 mg | INTRAVENOUS | Status: DC | PRN
  Administered 2017-08-22: 2 mg via INTRAVENOUS

## 2017-08-22 MED ORDER — HYDRALAZINE HCL 20 MG/ML IJ SOLN
5.0000 mg | INTRAMUSCULAR | Status: DC | PRN
  Administered 2017-08-22: 5 mg via INTRAVENOUS

## 2017-08-22 MED ORDER — LIDOCAINE HCL (PF) 1 % IJ SOLN
INTRAMUSCULAR | Status: AC
  Filled 2017-08-22: qty 30

## 2017-08-22 MED ORDER — MIDAZOLAM HCL 2 MG/2ML IJ SOLN
INTRAMUSCULAR | Status: DC | PRN
  Administered 2017-08-22 (×4): 1 mg via INTRAVENOUS

## 2017-08-22 SURGICAL SUPPLY — 26 items
BAG SNAP BAND KOVER 36X36 (MISCELLANEOUS) ×6 IMPLANT
BALLN LUTONIX AV 7X40X75 (BALLOONS) ×3
BALLN STERLING OTW 8X60X135 (BALLOONS) ×3
BALLOON LUTONIX AV 7X40X75 (BALLOONS) ×2 IMPLANT
BALLOON STERLING OTW 8X60X135 (BALLOONS) ×2 IMPLANT
CATH OMNI FLUSH 5F 65CM (CATHETERS) ×3 IMPLANT
CATH SOFT-VU 4F 65 STRAIGHT (CATHETERS) ×2 IMPLANT
CATH SOFT-VU STRAIGHT 4F 65CM (CATHETERS) ×1
COVER DOME SNAP 22 D (MISCELLANEOUS) ×3 IMPLANT
DEVICE TORQUE H2O (MISCELLANEOUS) ×3 IMPLANT
GUIDEWIRE ANGLED .035X150CM (WIRE) ×3 IMPLANT
KIT ENCORE 26 ADVANTAGE (KITS) ×3 IMPLANT
KIT MICROINTRODUCER STIFF 5F (SHEATH) ×3 IMPLANT
KIT PV (KITS) ×3 IMPLANT
SHEATH BRITE TIP 7FR 35CM (SHEATH) ×6 IMPLANT
SHEATH PINNACLE 5F 10CM (SHEATH) ×3 IMPLANT
STENT VIABAHN 8X29X80 VBX (Permanent Stent) ×3 IMPLANT
STENT VIABAHN 8X50X120 (Permanent Stent) ×3 IMPLANT
STENT VIABAHN5X120X8X (Permanent Stent) ×2 IMPLANT
SYR MEDRAD MARK V 150ML (SYRINGE) ×3 IMPLANT
TRANSDUCER W/STOPCOCK (MISCELLANEOUS) ×3 IMPLANT
TRAY PV CATH (CUSTOM PROCEDURE TRAY) ×3 IMPLANT
WIRE BENTSON .035X145CM (WIRE) ×3 IMPLANT
WIRE G V18X300CM (WIRE) ×3 IMPLANT
WIRE ROSEN-J .035X180CM (WIRE) ×3 IMPLANT
WIRE TORQFLEX AUST .018X40CM (WIRE) ×6 IMPLANT

## 2017-08-22 NOTE — Op Note (Signed)
Patient name: Deanna [name] MRN: [id number] DOB: 18-Mar-1941 Sex: female  08/22/2017 Pre-operative Diagnosis: #1: Bypass graft stenosis.  #2: Femoral femoral bypass graft aneurysm Post-operative diagnosis:  Same Surgeon:  Annamarie Major Procedure Performed:  1.  Ultrasound-guided access, left femoral artery  2.  Right lower extremity runoff  3.  Drug-coated balloon angioplasty, right limb of the femoral femoral bypass graft  4.  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  Conscious sedation (82 minutes)     Indications: The patient had a femoral-femoral bypass graft performed in the remote past.  Her most recent ultrasound showed progressive enlargement of a mid graft aneurysm as well as a high-grade stenosis at the anastomosis to the right common femoral artery.  She is here today for further evaluation and possible intervention.  Procedure:  The patient was identified in the holding area and taken to room 8.  The patient was then placed supine on the table and prepped and draped in the usual sterile fashion.  A time out was called.  Conscious sedation was administered with the use of IV fentanyl and Versed under continuous physician and nurse monitoring.  Heart rate, blood pressure, and oxygen saturations were continuously monitored.  Ultrasound was used to evaluate the lef6t common femoral artery.  It was patent .  A digital ultrasound image was acquired.  A micropuncture needle was used to access the left groin and femoral-femoral bypass graft.  1% lidocaine was used for local anesthesia.  I cannulated the bypass graft in its proximal portion with a micropuncture needle.  A wire was advanced under fluoroscopic visualization.  A micropuncture sheath was placed.  Contrast injections were performed through the sheath. Findings:   Aortogram: Not evaluated  Right Lower Extremity: 80% stenosis in the distal limb of the left to right  femoral-femoral bypass graft.  Common femoral profunda femoral and superficial femoral artery are widely patent.  The popliteal artery is patent throughout its course.  There is one vessel runoff via the peroneal artery  Left Lower Extremity: Not evaluated  Intervention: After the above images were acquired the decision was made to proceed with intervention.  Over a Rosen wire, a 7 French 30 cm Brite-tip sheath was advanced into the graft.  The patient was fully heparinized.  I selected a 7 x 40 drug-coated Lutonix balloon and performed balloon angioplasty of the distal limb of the femoral femoral graft near the right common femoral artery.  This was taken to nominal pressure for 2 minutes.  Follow-up imaging revealed improved result with residual stenosis approximately less than 15%.  Next attention was turned towards the mid graft aneurysm.  The size measurements were larger on the CT scan however the luminal diameter led me to believe that I could use aViabahn, rather than the VBX I had planned.  I selected a 8 x 5 stent and deployed it across the lesion.  It was molded with an 8 mm balloon.  Completion imaging showed a retrograde filling into the aneurysm and therefore I elected to extend this distally using a 8 x 29 VBX.  This was taken to rated pressure.  Follow-up imaging showed successful exclusion of the aneurysm.  This point the wire was removed.  The sheath was advanced into the graft further and the patient be taken to the holding area for sheath pull once the coagulation profile corrects.  Impression:  #1  80% stenosis in the distal right side  of the left to right femoral-femoral bypass graft.  This was treated using a 7 x 40 drug-coated balloon with residual stenosis less than 15%  #2  Successful exclusion of a mid graft aneurysm using a 8 x 50 Viabahn and a 8 x 29 VBX    V. Annamarie Major, M.D. Vascular and Vein Specialists of [place] Office: [phone] Pager:  [phone]

## 2017-08-22 NOTE — Progress Notes (Signed)
Up and walked and tol well; left groin stable, no bleeding or hematoma

## 2017-08-22 NOTE — Discharge Instructions (Signed)
Femoral Site Care °Refer to this sheet in the next few weeks. These instructions provide you with information about caring for yourself after your procedure. Your health care provider may also give you more specific instructions. Your treatment has been planned according to current medical practices, but problems sometimes occur. Call your health care provider if you have any problems or questions after your procedure. °What can I expect after the procedure? °After your procedure, it is typical to have the following: °· Bruising at the site that usually fades within 1-2 weeks. °· Blood collecting in the tissue (hematoma) that may be painful to the touch. It should usually decrease in size and tenderness within 1-2 weeks. ° °Follow these instructions at home: °· Take medicines only as directed by your health care provider. °· You may shower 24-48 hours after the procedure or as directed by your health care provider. Remove the bandage (dressing) and gently wash the site with plain soap and water. Pat the area dry with a clean towel. Do not rub the site, because this may cause bleeding. °· Do not take baths, swim, or use a hot tub until your health care provider approves. °· Check your insertion site every day for redness, swelling, or drainage. °· Do not apply powder or lotion to the site. °· Limit use of stairs to twice a day for the first 2-3 days or as directed by your health care provider. °· Do not squat for the first 2-3 days or as directed by your health care provider. °· Do not lift over 10 lb (4.5 kg) for 5 days after your procedure or as directed by your health care provider. °· Ask your health care provider when it is okay to: °? Return to work or school. °? Resume usual physical activities or sports. °? Resume sexual activity. °· Do not drive home if you are discharged the same day as the procedure. Have someone else drive you. °· You may drive 24 hours after the procedure unless otherwise instructed by  your health care provider. °· Do not operate machinery or power tools for 24 hours after the procedure or as directed by your health care provider. °· If your procedure was done as an outpatient procedure, which means that you went home the same day as your procedure, a responsible adult should be with you for the first 24 hours after you arrive home. °· Keep all follow-up visits as directed by your health care provider. This is important. °Contact a health care provider if: °· You have a fever. °· You have chills. °· You have increased bleeding from the site. Hold pressure on the site. °Get help right away if: °· You have unusual pain at the site. °· You have redness, warmth, or swelling at the site. °· You have drainage (other than a small amount of blood on the dressing) from the site. °· The site is bleeding, and the bleeding does not stop after 30 minutes of holding steady pressure on the site. °· Your leg or foot becomes pale, cool, tingly, or numb. °This information is not intended to replace advice given to you by your health care provider. Make sure you discuss any questions you have with your health care provider. °Document Released: 03/28/2014 Document Revised: 12/31/2015 Document Reviewed: 02/11/2014 °Elsevier Interactive Patient Education © 2018 [employer] °Moderate Conscious Sedation, Adult, Care After °These instructions provide you with information about caring for yourself after your procedure. Your health care provider may also give you more   specific instructions. Your treatment has been planned according to current medical practices, but problems sometimes occur. Call your health care provider if you have any problems or questions after your procedure. °What can I expect after the procedure? °After your procedure, it is common: °· To feel sleepy for several hours. °· To feel clumsy and have poor balance for several hours. °· To have poor judgment for several hours. °· To vomit if you eat too  soon. ° °Follow these instructions at home: °For at least 24 hours after the procedure: ° °· Do not: °? Participate in activities where you could fall or become injured. °? Drive. °? Use heavy machinery. °? Drink alcohol. °? Take sleeping pills or medicines that cause drowsiness. °? Make important decisions or sign legal documents. °? Take care of children on your own. °· Rest. °Eating and drinking °· Follow the diet recommended by your health care provider. °· If you vomit: °? Drink water, juice, or soup when you can drink without vomiting. °? Make sure you have little or no nausea before eating solid foods. °General instructions °· Have a responsible adult stay with you until you are awake and alert. °· Take over-the-counter and prescription medicines only as told by your health care provider. °· If you smoke, do not smoke without supervision. °· Keep all follow-up visits as told by your health care provider. This is important. °Contact a health care provider if: °· You keep feeling nauseous or you keep vomiting. °· You feel light-headed. °· You develop a rash. °· You have a fever. °Get help right away if: °· You have trouble breathing. °This information is not intended to replace advice given to you by your health care provider. Make sure you discuss any questions you have with your health care provider. °Document Released: 05/15/2013 Document Revised: 12/28/2015 Document Reviewed: 11/14/2015 °Elsevier Interactive Patient Education © 2018 [employer] Inc[employer] ° °

## 2017-08-22 NOTE — Progress Notes (Signed)
Site area: left groin fa sheath pulled at 1630. [place] took over manual hold at [employer] Prior to Removal:  Level 0 Pressure Applied For: 30 minutes Manual:   yes Patient Status During Pull:  stable Post Pull Site:  Level  0 Post Pull Instructions Given:  yes Post Pull Pulses Present: left PT dopplered during sheath pull and post sheath pull Dressing Applied:  tegaderm Bedrest begins @ 1700 Comments:

## 2017-08-22 NOTE — Interval H&P Note (Signed)
History and Physical Interval Note:  08/22/2017 12:00 PM  Deanna [name]  has presented today for surgery, with the diagnosis of pvd w/ claudication  The various methods of treatment have been discussed with the patient and family. After consideration of risks, benefits and other options for treatment, the patient has consented to  Procedure(s): ABDOMINAL AORTOGRAM W/LOWER EXTREMITY (N/A) as a surgical intervention .  The patient's history has been reviewed, patient examined, no change in status, stable for surgery.  I have reviewed the patient's chart and labs.  Questions were answered to the patient's satisfaction.     Annamarie Major

## 2017-08-23 ENCOUNTER — Telehealth: Payer: Self-pay | Admitting: Surgery

## 2017-08-23 ENCOUNTER — Encounter (HOSPITAL_COMMUNITY): Payer: Self-pay | Admitting: Surgery

## 2017-08-23 MED FILL — Morphine Sulfate Inj 4 MG/ML: INTRAMUSCULAR | Qty: 0.5 | Status: AC

## 2017-08-23 NOTE — Telephone Encounter (Signed)
-----   Message from Mena Goes, RN sent at 08/22/2017  2:25 PM EST ----- Regarding: 3 months   ----- Message ----- From: Serafina Mitchell, MD Sent: 08/22/2017   2:06 PM To: [facility] Charge Pool  08/22/2017:  Surgeon:  Annamarie Major Procedure Performed:  1.  Ultrasound-guided access, left femoral artery  2.  Right lower extremity runoff  3.  Drug-coated balloon angioplasty, right limb of the femoral femoral bypass graft  4.  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  Conscious sedation (82 minutes)  Follow-up 3 months with ABIs and duplex of her femoral-femoral graft, to see Vinnie Level

## 2017-08-23 NOTE — Telephone Encounter (Signed)
Sched appt 11/27/17; lab at 2:00 and NP at 3:45. Mailed letter.

## 2017-08-29 ENCOUNTER — Other Ambulatory Visit: Payer: Self-pay

## 2017-08-29 DIAGNOSIS — I739 Peripheral vascular disease, unspecified: Secondary | ICD-10-CM

## 2017-08-29 DIAGNOSIS — I70229 Atherosclerosis of native arteries of extremities with rest pain, unspecified extremity: Secondary | ICD-10-CM

## 2017-08-29 DIAGNOSIS — I998 Other disorder of circulatory system: Secondary | ICD-10-CM

## 2017-08-30 ENCOUNTER — Ambulatory Visit: Payer: Medicare HMO | Admitting: Neurology

## 2017-09-08 ENCOUNTER — Ambulatory Visit (HOSPITAL_COMMUNITY)
Admission: RE | Admit: 2017-09-08 | Payer: Medicare HMO | Source: Ambulatory Visit | Attending: Cardiovascular Disease | Admitting: Cardiovascular Disease

## 2017-09-08 ENCOUNTER — Ambulatory Visit: Payer: Medicare HMO | Admitting: Cardiovascular Disease

## 2017-09-08 ENCOUNTER — Telehealth: Payer: Self-pay | Admitting: Cardiovascular Disease

## 2017-09-08 NOTE — Telephone Encounter (Signed)
New message   Pt wants to know if she needs to keep her Carotid appt with Gwenlyn Found because she had a biopsy. Please call

## 2017-09-08 NOTE — Telephone Encounter (Signed)
Left message to call back  

## 2017-09-12 NOTE — Telephone Encounter (Signed)
Pt aware of new appt ./cy

## 2017-09-12 NOTE — Telephone Encounter (Signed)
Lm to call back ./cy 

## 2017-09-12 NOTE — Telephone Encounter (Signed)
Spoke with pt and pt would like to have carotid same day as appt with Dr Gwenlyn Found.Appt moved to 10/03/17 at 1:30 .

## 2017-09-25 ENCOUNTER — Encounter (HOSPITAL_COMMUNITY): Payer: Medicare HMO

## 2017-10-03 ENCOUNTER — Ambulatory Visit: Payer: Medicare HMO | Admitting: Cardiovascular Disease

## 2017-10-03 ENCOUNTER — Ambulatory Visit (HOSPITAL_COMMUNITY)
Admission: RE | Admit: 2017-10-03 | Discharge: 2017-10-03 | Disposition: A | Discharge status: Home / Self Care | Payer: Medicare HMO | Source: Ambulatory Visit | Attending: Cardiology | Admitting: Cardiology

## 2017-10-03 ENCOUNTER — Encounter: Payer: Self-pay | Admitting: Cardiovascular Disease

## 2017-10-03 VITALS — BP 152/68 | HR 75 | Ht 60.0 in | Wt 213.0 lb

## 2017-10-03 DIAGNOSIS — G458 Other transient cerebral ischemic attacks and related syndromes: Secondary | ICD-10-CM | POA: Diagnosis not present

## 2017-10-03 DIAGNOSIS — I6523 Occlusion and stenosis of bilateral carotid arteries: Secondary | ICD-10-CM | POA: Insufficient documentation

## 2017-10-03 DIAGNOSIS — I6529 Occlusion and stenosis of unspecified carotid artery: Secondary | ICD-10-CM

## 2017-10-03 DIAGNOSIS — I2510 Atherosclerotic heart disease of native coronary artery without angina pectoris: Secondary | ICD-10-CM | POA: Diagnosis not present

## 2017-10-03 DIAGNOSIS — I708 Atherosclerosis of other arteries: Secondary | ICD-10-CM | POA: Diagnosis not present

## 2017-10-03 DIAGNOSIS — I10 Essential (primary) hypertension: Secondary | ICD-10-CM | POA: Diagnosis not present

## 2017-10-03 NOTE — Patient Instructions (Signed)

## 2017-10-03 NOTE — Assessment & Plan Note (Signed)
History of calcified occluded left subclavian artery status post left common carotid to subclavian bypass by Dr. Trula Slade in the past.

## 2017-10-03 NOTE — Progress Notes (Signed)
10/03/2017 Deanna [name]   16-Sep-1940  [id number]  Primary Physician Bartholome Bill, MD Primary Cardiologist: Lorretta Harp MD FACP, [place], [place], [place]  HPI:  Deanna [name] is a 77 y.o.  moderately overweight divorced Caucasian female mother of one daughter who I last saw in the office 12/18/15. She was formerly a patient of Dr. Lowella Fairy. I am assuming her care. I have the procedures on her in the past as well. Her primary care physician is Dr. Valetta Fuller. She has a history of coronary artery disease status post anterior wall myocardial infarction in 1994 treated with LAD angioplasty by Dr. [name]. She had circumflex intervention the following year. Ultimately she required coronary bypass grafting in 1999 by Dr. Arlyce Dice . 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 Trula Slade. Other problems include to hypertension and hyperlipidemia. She does not smoke. She is not diabetic. She has a strong family history of heart disease. She denies chest pain, shortness of breath or claudication. She had intervention on her femorofemoral crossover graft by Dr. Brabham1/15/19 and is walking better since.   Current Meds  Medication Sig  . amLODipine (NORVASC) 5 MG tablet Take 5 mg by mouth daily.  Marland Kitchen aspirin EC 81 MG tablet Take 81 mg by mouth daily.  . Cholecalciferol (VITAMIN D) 2000 units tablet Take 2,000 Units by mouth daily.   . clopidogrel (PLAVIX) 75 MG tablet Take 1 tablet (75 mg total) by mouth once.  . CRESTOR 10 MG tablet TAKE ONE TABLET BY MOUTH DAILY  . levothyroxine (SYNTHROID, LEVOTHROID) 200 MCG tablet Take 200 mcg by mouth daily before breakfast.   . LORazepam (ATIVAN) 1 MG tablet Take 0.5 tablets (0.5 mg total) by mouth  as needed for anxiety. For aniexty (Patient taking differently: Take 0.5 mg by mouth as needed for anxiety. )  . metoprolol tartrate (LOPRESSOR) 25 MG tablet Take 1 tablet (25 mg total) by mouth 2 (two) times daily.  . sertraline (ZOLOFT) 100 MG tablet Take 100 mg by mouth 2 (two) times daily.   Marland Kitchen tolterodine (DETROL) 1 MG tablet Take 1 mg by mouth daily.   . traMADol (ULTRAM) 50 MG tablet Take 50 mg by mouth every 12 (twelve) hours as needed for moderate pain.   . valsartan (DIOVAN) 80 MG tablet Take 80 mg by mouth every morning.      Allergies  Allergen Reactions  . Hydrocodone-Acetaminophen Other (See Comments)    Delirium, confusion  . Other     Donepezil; altered mood, anger  . Oxycodone     Toxic dementia  . Vicodin [Hydrocodone-Acetaminophen] Other (See Comments)    Delirium, confusion    Social History   Socioeconomic History  . Marital status: Divorced    Spouse name: Not on file  . Number of children: Not on file  . Years of education: Not on file  . Highest education level: Not on file  Social Needs  . Financial resource strain: Not on file  . Food insecurity - worry: Not on file  . Food insecurity - inability: Not on file  . Transportation needs - medical: Not on file  . Transportation needs - non-medical: Not on file  Occupational History  . Not on file  Tobacco Use  . Smoking status: Former Smoker  Types: Cigarettes    Last attempt to quit: 11/27/1981    Years since quitting: 35.8  . Smokeless tobacco: Never Used  Substance and Sexual Activity  . Alcohol use: Yes    Alcohol/week: 0.0 oz    Comment: Rare;only twice yearly  . Drug use: No  . Sexual activity: No    Comment: 1st intercourse 9 yo-1 partner  Other Topics Concern  . Not on file  Social History Narrative  . Not on file     Review of Systems: General: negative for chills, fever, night sweats or weight changes.  Cardiovascular: negative for chest pain, dyspnea on exertion, edema,  orthopnea, palpitations, paroxysmal nocturnal dyspnea or shortness of breath Dermatological: negative for rash Respiratory: negative for cough or wheezing Urologic: negative for hematuria Abdominal: negative for nausea, vomiting, diarrhea, bright red blood per rectum, melena, or hematemesis Neurologic: negative for visual changes, syncope, or dizziness All other systems reviewed and are otherwise negative except as noted above.    Blood pressure (!) 152/68, pulse 75, height 5' (1.524 m), weight 213 lb (96.6 kg).  General appearance: alert and no distress Neck: no adenopathy, no JVD, supple, symmetrical, trachea midline, thyroid not enlarged, symmetric, no tenderness/mass/nodules and Soft bilateral carotid bruits Lungs: clear to auscultation bilaterally Heart: regular rate and rhythm, S1, S2 normal, no murmur, click, rub or gallop Extremities: extremities normal, atraumatic, no cyanosis or edema Pulses: 2+ and symmetric Skin: Skin color, texture, turgor normal. No rashes or lesions Neurologic: Alert and oriented X 3, normal strength and tone. Normal symmetric reflexes. Normal coordination and gait  EKG sinus rhythm at 75 sinus arrhythmia. I personally reviewed this EKG.  ASSESSMENT AND PLAN:   Essential hypertension History of essential hypertension blood pressure measured at 152/68. She is on amlodipine, metoprolol and valsartan. Continue current meds at current dosing  Coronary atherosclerosis History of CAD status post anterior wall myocardial infarction with intervention to LAD by Dr. [name] in 1994. She has circumflex intervention the following year and ultimately required coronary artery bypass grafting in 1999 by Dr. Arlyce Dice. She has not had a cardiac catheterization since and denies chest pain or shortness of breath.. Her last Myoview performed 09/22/09 was nonischemic.  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 Trula Slade in the past.      Lorretta Harp MD [facility], [facility] 10/03/2017 3:21 PM

## 2017-10-03 NOTE — Assessment & Plan Note (Signed)
History of CAD status post anterior wall myocardial infarction with intervention to LAD by Dr. [name] in 1994. She has circumflex intervention the following year and ultimately required coronary artery bypass grafting in 1999 by Dr. Arlyce Dice. She has not had a cardiac catheterization since and denies chest pain or shortness of breath.. Her last Myoview performed 09/22/09 was nonischemic.

## 2017-10-03 NOTE — Assessment & Plan Note (Signed)
History of essential hypertension blood pressure measured at 152/68. She is on amlodipine, metoprolol and valsartan. Continue current meds at current dosing

## 2017-10-12 ENCOUNTER — Other Ambulatory Visit: Payer: Self-pay | Admitting: Cardiovascular Disease

## 2017-10-12 DIAGNOSIS — I6529 Occlusion and stenosis of unspecified carotid artery: Secondary | ICD-10-CM

## 2017-10-13 ENCOUNTER — Telehealth: Payer: Self-pay | Admitting: Cardiovascular Disease

## 2017-10-13 NOTE — Telephone Encounter (Signed)
Patient made aware of results and verbalized her understanding.   Notes recorded by Lorretta Harp, MD on 10/05/2017 at 8:19 AM EST No change from prior study. Repeat in 6 months

## 2017-10-13 NOTE — Telephone Encounter (Signed)
New message     Patient is returning call from [place] from 10/12/17

## 2017-11-09 DIAGNOSIS — H401132 Primary open-angle glaucoma, bilateral, moderate stage: Secondary | ICD-10-CM | POA: Insufficient documentation

## 2017-11-14 ENCOUNTER — Observation Stay (HOSPITAL_COMMUNITY)
Admission: EM | Admit: 2017-11-14 | Discharge: 2017-11-16 | Disposition: A | Discharge status: Home / Self Care | Payer: Medicare HMO | Attending: Internal Medicine | Admitting: Internal Medicine

## 2017-11-14 ENCOUNTER — Emergency Department (HOSPITAL_COMMUNITY): Payer: Medicare HMO

## 2017-11-14 ENCOUNTER — Other Ambulatory Visit: Payer: Self-pay

## 2017-11-14 ENCOUNTER — Encounter (HOSPITAL_COMMUNITY): Payer: Self-pay | Admitting: Emergency Medicine

## 2017-11-14 DIAGNOSIS — I252 Old myocardial infarction: Secondary | ICD-10-CM | POA: Diagnosis not present

## 2017-11-14 DIAGNOSIS — Z87891 Personal history of nicotine dependence: Secondary | ICD-10-CM | POA: Insufficient documentation

## 2017-11-14 DIAGNOSIS — I110 Hypertensive heart disease with heart failure: Principal | ICD-10-CM | POA: Insufficient documentation

## 2017-11-14 DIAGNOSIS — K219 Gastro-esophageal reflux disease without esophagitis: Secondary | ICD-10-CM | POA: Diagnosis present

## 2017-11-14 DIAGNOSIS — E039 Hypothyroidism, unspecified: Secondary | ICD-10-CM | POA: Diagnosis present

## 2017-11-14 DIAGNOSIS — E785 Hyperlipidemia, unspecified: Secondary | ICD-10-CM | POA: Diagnosis not present

## 2017-11-14 DIAGNOSIS — E66813 Obesity, class 3: Secondary | ICD-10-CM

## 2017-11-14 DIAGNOSIS — F419 Anxiety disorder, unspecified: Secondary | ICD-10-CM | POA: Insufficient documentation

## 2017-11-14 DIAGNOSIS — Z8249 Family history of ischemic heart disease and other diseases of the circulatory system: Secondary | ICD-10-CM | POA: Insufficient documentation

## 2017-11-14 DIAGNOSIS — G8929 Other chronic pain: Secondary | ICD-10-CM | POA: Diagnosis not present

## 2017-11-14 DIAGNOSIS — I5033 Acute on chronic diastolic (congestive) heart failure: Secondary | ICD-10-CM | POA: Diagnosis not present

## 2017-11-14 DIAGNOSIS — J9601 Acute respiratory failure with hypoxia: Secondary | ICD-10-CM | POA: Diagnosis not present

## 2017-11-14 DIAGNOSIS — G4733 Obstructive sleep apnea (adult) (pediatric): Secondary | ICD-10-CM | POA: Diagnosis not present

## 2017-11-14 DIAGNOSIS — I739 Peripheral vascular disease, unspecified: Secondary | ICD-10-CM | POA: Diagnosis not present

## 2017-11-14 DIAGNOSIS — N179 Acute kidney failure, unspecified: Secondary | ICD-10-CM | POA: Diagnosis not present

## 2017-11-14 DIAGNOSIS — Z7982 Long term (current) use of aspirin: Secondary | ICD-10-CM | POA: Insufficient documentation

## 2017-11-14 DIAGNOSIS — Z7902 Long term (current) use of antithrombotics/antiplatelets: Secondary | ICD-10-CM | POA: Diagnosis not present

## 2017-11-14 DIAGNOSIS — I2510 Atherosclerotic heart disease of native coronary artery without angina pectoris: Secondary | ICD-10-CM | POA: Diagnosis not present

## 2017-11-14 DIAGNOSIS — I160 Hypertensive urgency: Secondary | ICD-10-CM | POA: Diagnosis present

## 2017-11-14 DIAGNOSIS — Z951 Presence of aortocoronary bypass graft: Secondary | ICD-10-CM | POA: Insufficient documentation

## 2017-11-14 DIAGNOSIS — D696 Thrombocytopenia, unspecified: Secondary | ICD-10-CM | POA: Diagnosis not present

## 2017-11-14 DIAGNOSIS — Z6841 Body Mass Index (BMI) 40.0 and over, adult: Secondary | ICD-10-CM | POA: Diagnosis not present

## 2017-11-14 DIAGNOSIS — G458 Other transient cerebral ischemic attacks and related syndromes: Secondary | ICD-10-CM | POA: Diagnosis present

## 2017-11-14 DIAGNOSIS — R0602 Shortness of breath: Secondary | ICD-10-CM

## 2017-11-14 DIAGNOSIS — F329 Major depressive disorder, single episode, unspecified: Secondary | ICD-10-CM | POA: Diagnosis not present

## 2017-11-14 DIAGNOSIS — Z9989 Dependence on other enabling machines and devices: Secondary | ICD-10-CM

## 2017-11-14 DIAGNOSIS — I5042 Chronic combined systolic (congestive) and diastolic (congestive) heart failure: Secondary | ICD-10-CM

## 2017-11-14 DIAGNOSIS — F341 Dysthymic disorder: Secondary | ICD-10-CM | POA: Diagnosis present

## 2017-11-14 DIAGNOSIS — I5032 Chronic diastolic (congestive) heart failure: Secondary | ICD-10-CM | POA: Diagnosis present

## 2017-11-14 DIAGNOSIS — I509 Heart failure, unspecified: Secondary | ICD-10-CM | POA: Diagnosis not present

## 2017-11-14 DIAGNOSIS — I25708 Atherosclerosis of coronary artery bypass graft(s), unspecified, with other forms of angina pectoris: Secondary | ICD-10-CM | POA: Diagnosis present

## 2017-11-14 DIAGNOSIS — I10 Essential (primary) hypertension: Secondary | ICD-10-CM | POA: Diagnosis present

## 2017-11-14 HISTORY — DX: Unspecified Escherichia coli (E. coli) as the cause of diseases classified elsewhere: B96.20

## 2017-11-14 HISTORY — DX: Heart failure, unspecified: I50.9

## 2017-11-14 HISTORY — DX: Bacteremia: R78.81

## 2017-11-14 HISTORY — DX: Acute myocardial infarction, unspecified: I21.9

## 2017-11-14 HISTORY — DX: Tubulo-interstitial nephritis, not specified as acute or chronic: N12

## 2017-11-14 HISTORY — DX: Chronic combined systolic (congestive) and diastolic (congestive) heart failure: I50.42

## 2017-11-14 HISTORY — DX: Pneumonia, unspecified organism: J18.9

## 2017-11-14 LAB — CBC
HEMATOCRIT: 42.1 % (ref 36.0–46.0)
HEMATOCRIT: 43.5 % (ref 36.0–46.0)
HEMOGLOBIN: 14 g/dL (ref 12.0–15.0)
HEMOGLOBIN: 14.7 g/dL (ref 12.0–15.0)
MCH: 28.3 pg (ref 26.0–34.0)
MCH: 28.6 pg (ref 26.0–34.0)
MCHC: 33.3 g/dL (ref 30.0–36.0)
MCHC: 33.8 g/dL (ref 30.0–36.0)
MCV: 85.2 fL (ref 78.0–100.0)
MCV: 84.6 fL (ref 78.0–100.0)
Platelets: 117 10*3/uL — ABNORMAL LOW (ref 150–400)
Platelets: 121 10*3/uL — ABNORMAL LOW (ref 150–400)
RBC: 4.94 MIL/uL (ref 3.87–5.11)
RBC: 5.14 MIL/uL — AB (ref 3.87–5.11)
RDW: 13.4 % (ref 11.5–15.5)
RDW: 13.2 % (ref 11.5–15.5)
WBC: 6 10*3/uL (ref 4.0–10.5)
WBC: 5.8 10*3/uL (ref 4.0–10.5)

## 2017-11-14 LAB — I-STAT TROPONIN, ED
TROPONIN I, POC: 0 ng/mL (ref 0.00–0.08)
Troponin i, poc: 0 ng/mL (ref 0.00–0.08)

## 2017-11-14 LAB — TSH: TSH: 4.355 u[IU]/mL (ref 0.350–4.500)

## 2017-11-14 LAB — BASIC METABOLIC PANEL
Anion gap: 12 (ref 5–15)
BUN: 12 mg/dL (ref 6–20)
CO2: 22 mmol/L (ref 22–32)
Calcium: 9.5 mg/dL (ref 8.9–10.3)
Chloride: 103 mmol/L (ref 101–111)
Creatinine, Ser: 0.83 mg/dL (ref 0.44–1.00)
GFR calc non Af Amer: >60 mL/min (ref >60)
GLUCOSE: 112 mg/dL — AB (ref 65–99)
Potassium: 4.3 mmol/L (ref 3.5–5.1)
Sodium: 137 mmol/L (ref 135–145)

## 2017-11-14 LAB — CREATININE, SERUM
Creatinine, Ser: 0.92 mg/dL (ref 0.44–1.00)
GFR calc Af Amer: >60 mL/min (ref >60)
GFR calc non Af Amer: 59 mL/min — ABNORMAL LOW (ref >60)

## 2017-11-14 LAB — BRAIN NATRIURETIC PEPTIDE: B Natriuretic Peptide: 469.8 pg/mL — ABNORMAL HIGH (ref 0.0–100.0)

## 2017-11-14 LAB — MAGNESIUM: Magnesium: 2 mg/dL (ref 1.7–2.4)

## 2017-11-14 LAB — TROPONIN I

## 2017-11-14 MED ORDER — CLOPIDOGREL BISULFATE 75 MG PO TABS
75.0000 mg | ORAL_TABLET | Freq: Every evening | ORAL | Status: DC
  Administered 2017-11-15 (×2): 75 mg via ORAL
  Filled 2017-11-14 (×2): qty 1

## 2017-11-14 MED ORDER — ONDANSETRON HCL 4 MG PO TABS: 4.0000 mg | ORAL_TABLET | Freq: Four times a day (QID) | ORAL | Status: DC | PRN

## 2017-11-14 MED ORDER — LORAZEPAM 1 MG PO TABS
1.0000 mg | ORAL_TABLET | Freq: Every day | ORAL | Status: DC | PRN
  Administered 2017-11-15 – 2017-11-16 (×2): 1 mg via ORAL
  Filled 2017-11-14 (×2): qty 1

## 2017-11-14 MED ORDER — IRBESARTAN 75 MG PO TABS
75.0000 mg | ORAL_TABLET | Freq: Every day | ORAL | Status: DC
  Administered 2017-11-15 – 2017-11-16 (×2): 75 mg via ORAL
  Filled 2017-11-14 (×2): qty 1

## 2017-11-14 MED ORDER — TIZANIDINE HCL 4 MG PO TABS
2.0000 mg | ORAL_TABLET | Freq: Four times a day (QID) | ORAL | Status: DC | PRN
  Administered 2017-11-15: 2 mg via ORAL
  Filled 2017-11-14: qty 1

## 2017-11-14 MED ORDER — LEVOTHYROXINE SODIUM 200 MCG PO TABS
200.0000 ug | ORAL_TABLET | Freq: Every day | ORAL | Status: DC
  Administered 2017-11-15: 200 ug via ORAL
  Filled 2017-11-14: qty 1
  Filled 2017-11-14: qty 2
  Filled 2017-11-14: qty 1

## 2017-11-14 MED ORDER — MIRABEGRON ER 25 MG PO TB24
25.0000 mg | ORAL_TABLET | Freq: Every day | ORAL | Status: DC
  Administered 2017-11-15 – 2017-11-16 (×2): 25 mg via ORAL
  Filled 2017-11-14 (×2): qty 1

## 2017-11-14 MED ORDER — LATANOPROST 0.005 % OP SOLN
1.0000 [drp] | Freq: Every day | OPHTHALMIC | Status: DC
  Administered 2017-11-15 (×2): 1 [drp] via OPHTHALMIC
  Filled 2017-11-14: qty 2.5

## 2017-11-14 MED ORDER — IOPAMIDOL (ISOVUE-370) INJECTION 76%
INTRAVENOUS | Status: AC
  Filled 2017-11-14: qty 100

## 2017-11-14 MED ORDER — FUROSEMIDE 10 MG/ML IJ SOLN
40.0000 mg | Freq: Two times a day (BID) | INTRAMUSCULAR | Status: DC
  Administered 2017-11-15 – 2017-11-16 (×3): 40 mg via INTRAVENOUS
  Filled 2017-11-14 (×3): qty 4

## 2017-11-14 MED ORDER — METOPROLOL TARTRATE 25 MG PO TABS
25.0000 mg | ORAL_TABLET | Freq: Two times a day (BID) | ORAL | Status: DC
  Administered 2017-11-15 – 2017-11-16 (×4): 25 mg via ORAL
  Filled 2017-11-14 (×4): qty 1

## 2017-11-14 MED ORDER — TRAMADOL HCL 50 MG PO TABS
50.0000 mg | ORAL_TABLET | Freq: Four times a day (QID) | ORAL | Status: DC | PRN
  Administered 2017-11-15: 50 mg via ORAL
  Filled 2017-11-14: qty 1

## 2017-11-14 MED ORDER — HYDRALAZINE HCL 20 MG/ML IJ SOLN: 10.0000 mg | INTRAMUSCULAR | Status: DC | PRN

## 2017-11-14 MED ORDER — ASPIRIN EC 81 MG PO TBEC
81.0000 mg | DELAYED_RELEASE_TABLET | Freq: Every day | ORAL | Status: DC
  Administered 2017-11-15 (×2): 81 mg via ORAL
  Filled 2017-11-14 (×2): qty 1

## 2017-11-14 MED ORDER — ENOXAPARIN SODIUM 40 MG/0.4ML ~~LOC~~ SOLN
40.0000 mg | SUBCUTANEOUS | Status: DC
  Administered 2017-11-15: 40 mg via SUBCUTANEOUS
  Filled 2017-11-14: qty 0.4

## 2017-11-14 MED ORDER — IOPAMIDOL (ISOVUE-370) INJECTION 76%
100.0000 mL | Freq: Once | INTRAVENOUS | Status: AC | PRN
  Administered 2017-11-14: 100 mL via INTRAVENOUS

## 2017-11-14 MED ORDER — POTASSIUM CHLORIDE CRYS ER 10 MEQ PO TBCR
10.0000 meq | EXTENDED_RELEASE_TABLET | Freq: Every day | ORAL | Status: DC
  Administered 2017-11-15 – 2017-11-16 (×3): 10 meq via ORAL
  Filled 2017-11-14 (×3): qty 1

## 2017-11-14 MED ORDER — VITAMIN B-1 100 MG PO TABS
250.0000 mg | ORAL_TABLET | Freq: Every day | ORAL | Status: DC
  Administered 2017-11-15: 250 mg via ORAL
  Filled 2017-11-14: qty 3

## 2017-11-14 MED ORDER — ONDANSETRON HCL 4 MG/2ML IJ SOLN
4.0000 mg | Freq: Four times a day (QID) | INTRAMUSCULAR | Status: DC | PRN
  Administered 2017-11-15: 4 mg via INTRAVENOUS
  Filled 2017-11-14: qty 2

## 2017-11-14 MED ORDER — SERTRALINE HCL 100 MG PO TABS
100.0000 mg | ORAL_TABLET | Freq: Every day | ORAL | Status: DC
  Administered 2017-11-15 (×2): 100 mg via ORAL
  Filled 2017-11-14 (×3): qty 1

## 2017-11-14 MED ORDER — ROSUVASTATIN CALCIUM 10 MG PO TABS
10.0000 mg | ORAL_TABLET | Freq: Every day | ORAL | Status: DC
  Filled 2017-11-14: qty 1

## 2017-11-14 MED ORDER — FUROSEMIDE 10 MG/ML IJ SOLN
20.0000 mg | Freq: Once | INTRAMUSCULAR | Status: AC
  Administered 2017-11-14: 20 mg via INTRAVENOUS
  Filled 2017-11-14: qty 2

## 2017-11-14 MED ORDER — FAMOTIDINE 20 MG PO TABS
20.0000 mg | ORAL_TABLET | Freq: Two times a day (BID) | ORAL | Status: DC
  Administered 2017-11-15 (×3): 20 mg via ORAL
  Filled 2017-11-14 (×2): qty 1

## 2017-11-14 MED ORDER — AMLODIPINE BESYLATE 5 MG PO TABS
5.0000 mg | ORAL_TABLET | Freq: Every day | ORAL | Status: DC
  Administered 2017-11-15 – 2017-11-16 (×2): 5 mg via ORAL
  Filled 2017-11-14 (×2): qty 1

## 2017-11-14 MED ORDER — PANTOPRAZOLE SODIUM 40 MG PO TBEC
40.0000 mg | DELAYED_RELEASE_TABLET | Freq: Every day | ORAL | Status: DC
  Administered 2017-11-15: 40 mg via ORAL
  Filled 2017-11-14: qty 1

## 2017-11-14 NOTE — ED Triage Notes (Addendum)
Pt states her MD started her on a new medication for anxiety two days ago. Pt started having SOB/CP and lower leg swelling after starting this medication. Pt states pain goes into her back as well. Denies N/V. Pt has hx of MI, CABG

## 2017-11-14 NOTE — ED Notes (Signed)
Placed nonslip, yellow socks on pt. Pt c/o of being cold, blanket and socks given.

## 2017-11-14 NOTE — Progress Notes (Signed)
Placed patient on CPAP for the night via auto-mode.  

## 2017-11-14 NOTE — ED Notes (Signed)
Patient transported to CT 

## 2017-11-14 NOTE — H&P (Addendum)
History and Physical    Deanna [name]:[id number] DOB: 1941-03-31 DOA: 11/14/2017  PCP: Bartholome Bill, MD  Patient coming from: Home.  Chief Complaint: Chest pain and shortness of breath.  HPI: BERTINE SCHLOTTMAN is a 77 y.o. female with history of CAD status post CABG, peripheral vascular disease status post recent procedures done by Dr. Trula Slade in January 2019, hypertension, traumatic brain injury with history of depression, hypothyroidism presents to the ER with complaints of increasing shortness of breath and chest pressure.  Patient has been having the symptoms for last 3 days.  Increase on exertion.  Also noticed some lower extremity edema.  Denies any productive cough fever or chills.  Chest pain at times radiates to the back.  Patient also was noticed to have increasing blood pressure last 1 week.    Patient states last week patient was started on Meloxicam for low back pain and also Depakote for mood disorder.  Following taking these patient symptoms worsen and patient's daughter has stopped giving the medication for last 2 days.  ED Course: In the ER patient was found to be mildly short of breath.  BNP was 469.  CT angiogram of the chest done shows small bilateral pleural effusion and cardiomegaly.  Patient was given Lasix 20 mg IV in the ER.  EKG was showing nonspecific findings with poor tracing.  Will need repeat EKG.  Patient admitted for further management of chest pain and shortness of breath.  Blood pressure is found to be elevated.  Review of Systems: As per HPI, rest all negative.   Past Medical History:  Diagnosis Date  . Anxiety   . Atherosclerotic peripheral vascular disease ([place])   . CAD (coronary artery disease)    followed by dr [name].  . Celiac artery stenosis ([place])   . CHF (congestive heart failure) ([place]) 11/14/2017  . Colon polyps   . Community acquired pneumonia   . Depression   . Diverticulosis   . Dysuria   . GERD (gastroesophageal reflux  disease)   . Hemorrhoids   . Hypertension   . Hypothyroidism   . Myocardial infarction ([place])   . Peripheral arterial disease ([place])   . Pyelonephritis   . Sepsis ([place])   . Sinus drainage    took z-pack   finished yesterday  . Urticaria   . Vertigo, benign positional     Past Surgical History:  Procedure Laterality Date  . ABDOMINAL AORTOGRAM W/LOWER EXTREMITY N/A 08/22/2017   Procedure: ABDOMINAL AORTOGRAM W/LOWER EXTREMITY;  Surgeon: Serafina Mitchell, MD;  Location: [place] [facility];  Service: Cardiovascular;  Laterality: N/A;  . BILATERAL UPPER EXTREMITY ANGIOGRAM N/A 09/11/2012   Procedure: BILATERAL UPPER EXTREMITY ANGIOGRAM;  Surgeon: Serafina Mitchell, MD;  Location[facility] CATH LAB;  Service: Cardiovascular;  Laterality: N/A;  . carotid duplex doppler Bilateral 09/03/2012, 11/03/2011   Evidence of 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 patent left common carotid- subclavian artery bypass with turbulent flow noted at the anastomosis with velocities of 295 cm/s  . CAROTID-SUBCLAVIAN BYPASS GRAFT  12/15/2011   Procedure: BYPASS GRAFT CAROTID-SUBCLAVIAN;  Surgeon: Serafina Mitchell, MD;  Location: [facility] OR;  Service: Vascular;  Laterality: Left;  Left Carotid subclavian bypass  . CORONARY ANGIOPLASTY    . CORONARY ARTERY BYPASS GRAFT    . DOPPLER ECHOCARDIOGRAPHY  05/27/2010, 09/17/2008   Mild Proximal septal thickening is noted. Left  ventricular systolic functions is normal ejection fraction =>55%. the aortic valve appears to be mildly sclerotic   . fem-fem bypass graft    . holter monitor  01/21/2008   The predominant rhythm was normal sinus rhythm. Minimum heartrate of 63 bpm at +01:00, maximum heartrate of 105 bpm at + 10:35; and the average heartrate of 75 bpm. Ventricular ectopic activity totaled 1270: Multifocal; 866-PVC's and 404-VEs             .  JOINT REPLACEMENT     Left knee  . LEFT HEART CATHETERIZATION WITH CORONARY/GRAFT ANGIOGRAM N/A 12/21/2011   Procedure: LEFT HEART CATHETERIZATION WITH Beatrix Fetters;  Surgeon: Leonie Man, MD;  Location: [facility] CATH LAB;  Service: Cardiovascular;  Laterality: N/A;  . NM MYOCAR PERF EJECTION FRACTION  09/22/2009, 07/03/2007   the post stress myocardial perfusion images show a normal pattern of perfusion is all regions. The post-stress ejection fraction is 68 %. no significant wall motion abnormalities noted. This is a low risk scan.  Marland Kitchen PERIPHERAL VASCULAR INTERVENTION  08/22/2017   Procedure: PERIPHERAL VASCULAR INTERVENTION;  Surgeon: Serafina Mitchell, MD;  Location: [place] [facility];  Service: Cardiovascular;;  Fem/Fem Graft  . REPLACEMENT TOTAL KNEE  05-2011  . UNILATERAL UPPER EXTREMEITY ANGIOGRAM Left 11/15/2011   Procedure: UNILATERAL UPPER EXTREMEITY ANGIOGRAM;  Surgeon: Lorretta Harp, MD;  Location: [facility] CATH LAB;  Service: Cardiovascular;  Laterality: Left;     reports that she quit smoking about 35 years ago. Her smoking use included cigarettes. She has never used smokeless tobacco. She reports that she drinks alcohol. She reports that she does not use drugs.  Allergies  Allergen Reactions  . Donepezil Other (See Comments)    Altered mood, aggression, and caused anger  . Hydrocodone-Acetaminophen Other (See Comments)    "Delirium, confusion, toxic dementia"  . Memantine Other (See Comments)    Caused severe aggression  . Oxycodone Other (See Comments)    Toxic dementia  . Vicodin [Hydrocodone-Acetaminophen] Other (See Comments)    Delirium, confusion, and toxic dementia    Family History  Problem Relation Age of Onset  . Heart attack Mother   . Heart disease Mother        before age 3  . Diabetes Father   . Heart disease Father        before age 32  . Hypertension Father   . Hyperlipidemia Father   . Heart attack Father   . Colon cancer Brother   . Heart  attack Brother   . Hyperlipidemia Brother   . Hypertension Brother   . Heart disease Brother        before age 18  . Diabetes Sister   . Heart disease Sister        before age 3  . Hyperlipidemia Sister   . Hypertension Sister   . Heart attack Sister     Prior to Admission medications   Medication Sig Start Date End Date Taking? Authorizing Provider  acetaminophen (TYLENOL) 325 MG tablet Take 325 mg by mouth every 6 (six) hours as needed (for pain).   Yes [provider]  amLODipine (NORVASC) 5 MG tablet Take 5 mg by mouth daily.   Yes [provider]  aspirin EC 81 MG tablet Take 81 mg by mouth at bedtime.    Yes [provider]  Cholecalciferol (VITAMIN D-3) 1000 units CAPS Take 2,000 Units by mouth at bedtime.   Yes [provider]  clopidogrel (PLAVIX) 75  MG tablet Take 1 tablet (75 mg total) by mouth once. Patient taking differently: Take 75 mg by mouth every evening.  06/24/13  Yes Lorretta Harp, MD  CRESTOR 10 MG tablet TAKE ONE TABLET BY MOUTH DAILY Patient taking differently: Take 10 mg by mouth at bedtime 02/06/13  Yes Terance Ice, MD  latanoprost (XALATAN) 0.005 % ophthalmic solution Place 1 drop into both eyes at bedtime. 11/09/17  Yes [provider]  levothyroxine (SYNTHROID, LEVOTHROID) 200 MCG tablet Take 200 mcg by mouth daily before breakfast.  12/08/14  Yes [provider]  LORazepam (ATIVAN) 1 MG tablet Take 0.5 tablets (0.5 mg total) by mouth as needed for anxiety. For aniexty Patient taking differently: Take 1 mg by mouth daily as needed for anxiety.  12/29/11  Yes Roczniak, [name] J, PA-C  metoprolol tartrate (LOPRESSOR) 50 MG tablet Take 25 mg by mouth 2 (two) times daily.    Yes [provider]  mirabegron ER (MYRBETRIQ) 25 MG TB24 tablet Take 25 mg by mouth daily.   Yes [provider]  omeprazole (PRILOSEC) 40 MG capsule Take 40 mg by mouth daily.   Yes [provider]    ranitidine (ZANTAC) 150 MG capsule Take 150 mg by mouth daily as needed for heartburn.    Yes [provider]  sertraline (ZOLOFT) 100 MG tablet Take 100 mg by mouth at bedtime.  11/24/15  Yes [provider]  Thiamine HCl (VITAMIN B-1) 250 MG tablet Take 250 mg by mouth daily.   Yes [provider]  tiZANidine (ZANAFLEX) 2 MG tablet Take 2 mg by mouth every 6 (six) hours as needed for muscle spasms.   Yes [provider]  traMADol (ULTRAM) 50 MG tablet Take 50 mg by mouth every 6 (six) hours as needed (for pain).  02/24/15  Yes [provider]  valsartan (DIOVAN) 80 MG tablet Take 80 mg by mouth every morning.    Yes [provider]  divalproex (DEPAKOTE ER) 250 MG 24 hr tablet Take 250 mg by mouth daily.    [provider]  meloxicam (MOBIC) 15 MG tablet Take 15 mg by mouth daily.    [provider]  metoprolol tartrate (LOPRESSOR) 25 MG tablet Take 1 tablet (25 mg total) by mouth 2 (two) times daily. Patient not taking: Reported on 11/14/2017 04/30/15   Thurnell Lose, MD    Physical Exam: Vitals:   11/14/17 1552 11/14/17 1645 11/14/17 1700 11/14/17 2130  BP: (!) 192/79 (!) 170/68 (!) 181/86 (!) 189/74  Pulse: 79 78 76   Resp: 18  (!) 24 20  Temp:      TempSrc:      SpO2: 95% 93% 90%   Weight:      Height:          Constitutional: Moderately built and nourished. Vitals:   11/14/17 1552 11/14/17 1645 11/14/17 1700 11/14/17 2130  BP: (!) 192/79 (!) 170/68 (!) 181/86 (!) 189/74  Pulse: 79 78 76   Resp: 18  (!) 24 20  Temp:      TempSrc:      SpO2: 95% 93% 90%   Weight:      Height:       Eyes: Anicteric no pallor. ENMT: No discharge from the ears eyes nose or mouth. Neck: No mass felt.  No neck rigidity.  JVD mildly elevated. Respiratory: No rhonchi mild crepitations. Cardiovascular: S1-S2 heard no murmurs appreciated. Abdomen: Soft nontender bowel sounds present. Musculoskeletal: Bilateral lower  extremity edema. Skin: No rash. Neurologic: Alert awake oriented to time place and person.  Moves all extremities. Psychiatric: Appears normal.  Normal affect.   Labs on Admission: I have personally reviewed following labs and imaging studies  CBC: Recent Labs  Lab 11/14/17 1303  WBC 6.0  HGB 14.0  HCT 42.1  MCV 85.2  PLT 494*   Basic Metabolic Panel: Recent Labs  Lab 11/14/17 1303  NA 137  K 4.3  CL 103  CO2 22  GLUCOSE 112*  BUN 12  CREATININE 0.83  CALCIUM 9.5   GFR: Estimated Creatinine Clearance: 61.3 mL/min (by C-G formula based on SCr of 0.83 mg/dL). Liver Function Tests: No results for input(s): AST, ALT, ALKPHOS, BILITOT, PROT, ALBUMIN in the last 168 hours. No results for input(s): LIPASE, AMYLASE in the last 168 hours. No results for input(s): AMMONIA in the last 168 hours. Coagulation Profile: No results for input(s): INR, PROTIME in the last 168 hours. Cardiac Enzymes: No results for input(s): CKTOTAL, CKMB, CKMBINDEX, TROPONINI in the last 168 hours. BNP (last 3 results) No results for input(s): PROBNP in the last 8760 hours. HbA1C: No results for input(s): HGBA1C in the last 72 hours. CBG: No results for input(s): GLUCAP in the last 168 hours. Lipid Profile: No results for input(s): CHOL, HDL, LDLCALC, TRIG, CHOLHDL, LDLDIRECT in the last 72 hours. Thyroid Function Tests: No results for input(s): TSH, T4TOTAL, FREET4, T3FREE, THYROIDAB in the last 72 hours. Anemia Panel: No results for input(s): VITAMINB12, FOLATE, FERRITIN, TIBC, IRON, RETICCTPCT in the last 72 hours. Urine analysis:    Component Value Date/Time   COLORURINE YELLOW 04/26/2015 0811   APPEARANCEUR CLEAR 04/26/2015 0811   LABSPEC <1.005 (L) 04/26/2015 0811   PHURINE 6.0 04/26/2015 0811   GLUCOSEU NEGATIVE 04/26/2015 0811   HGBUR SMALL (A) 04/26/2015 0811   BILIRUBINUR NEGATIVE 04/26/2015 0811   KETONESUR NEGATIVE 04/26/2015 0811   PROTEINUR NEGATIVE 04/26/2015 0811    UROBILINOGEN 0.2 04/26/2015 0811   NITRITE NEGATIVE 04/26/2015 0811   LEUKOCYTESUR MODERATE (A) 04/26/2015 0811   Sepsis Labs: @LABRCNTIP (procalcitonin:4,lacticidven:4) )No results found for this or any previous visit (from the past 240 hour(s)).   Radiological Exams on Admission: Dg Chest 2 View  Result Date: 11/14/2017 CLINICAL DATA:  Shortness of breath, chest pain. EXAM: CHEST - 2 VIEW COMPARISON:  Radiograph of April 27, 2015. FINDINGS: The heart size and mediastinal contours are within normal limits. Atherosclerosis of thoracic aorta is noted. Status post coronary artery bypass graft. No pneumothorax is noted. Probable small left pleural effusion is noted. Both lungs are clear. Degenerative changes seen involving the right shoulder. IMPRESSION: Probable small left pleural effusion. No other significant abnormality seen in the chest. Aortic Atherosclerosis (ICD10-I70.0). Electronically Signed   By: Marijo Conception, M.D.   On: 11/14/2017 13:10   Ct Angio Chest Pe W/cm &/or Wo Cm  Result Date: 11/14/2017 CLINICAL DATA:  Chest pain and foot swelling for 2 days. EXAM: CT ANGIOGRAPHY CHEST WITH CONTRAST TECHNIQUE: Multidetector CT imaging of the chest was performed using the standard protocol during bolus administration of intravenous contrast. Multiplanar CT image reconstructions and MIPs were obtained to evaluate the vascular anatomy. CONTRAST:  100 ml ISOVUE-370 IOPAMIDOL (ISOVUE-370) INJECTION 76% COMPARISON:  PA and lateral chest this same day. CT chest 04/26/2015. FINDINGS: Cardiovascular: No pulmonary embolus is identified. The patient is status post CABG. There is cardiomegaly. Extensive calcific aortic and coronary atherosclerosis are noted. No aortic aneurysm. Mediastinum/Nodes: No enlarged mediastinal, hilar, or axillary lymph  nodes. Thyroid gland, trachea, and esophagus demonstrate no significant findings. Lungs/Pleura: Small bilateral pleural effusions are seen. The lungs demonstrate  mild dependent atelectasis but are otherwise unremarkable. Upper Abdomen: Negative. Musculoskeletal: The patient has severe multilevel degenerative disc disease. Autologous fusion is seen across the disc interspaces from T9-T12. No acute bony abnormality. Review of the MIP images confirms the above findings. IMPRESSION: Negative for pulmonary embolus. Small bilateral pleural effusions. Cardiomegaly. Status post CABG.  Extensive coronary atherosclerosis noted. Severe multilevel thoracic spondylosis. Aortic Atherosclerosis (ICD10-I70.0). Electronically Signed   By: Inge Rise M.D.   On: 11/14/2017 20:06    EKG: Independently reviewed.  Normal sinus rhythm with concerning ST-T changes in inferior leads.  Patient's baseline is wandering.  Will get a repeat EKG.  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 Acute CHF last EF measured was in September 2016 showing 60-65% with grade 1 diastolic dysfunction -I have placed patient on Lasix 40 mg IV every 12.  Closely follow intake output metabolic panel and daily weights.  Check 2D echo. 2. Hypertensive urgency likely contributing to patient's symptoms -in addition to home dose of amlodipine, metoprolol and Diovan I have placed patient on PRN IV hydralazine.  Closely follow blood pressure trends. 3. Chest pain with history of CAD status post CABG -we will cycle cardiac markers continue metoprolol aspirin Plavix statins.  Request cardiology consult.  Place the chest pain-free.  Repeat EKG has been ordered. 4. Hypothyroidism on Synthroid. 5. Hyperlipidemia on statins. 6. Sleep apnea on CPAP. 7. Peripheral vascular disease status post recent procedure by Dr. Trula Slade.   8. History of depression on Zoloft.  Depakote was recently started which is being held. 9. Chronic pain on tramadol as  needed.  Meloxicam also was recently started which has been discontinued at this time.   DVT prophylaxis: Lovenox. Code Status: Full code. Family Communication: Discussed with patient. Disposition Plan: Home. Consults called: Cardiology. Admission status: Inpatient.   Rise Patience MD [facility] Pager [phone].  If 7PM-7AM, please contact night-coverage [email or web address] Password [email or web address]  11/14/2017, 10:00 PM

## 2017-11-14 NOTE — ED Notes (Signed)
Gave pt [place] sandwich and applesauce (bag lunch), per Dr. Ralene Bathe.

## 2017-11-14 NOTE — ED Notes (Signed)
Pt's daughter departed with pt belongings and medication.

## 2017-11-14 NOTE — ED Notes (Signed)
Pt using a Purewick

## 2017-11-14 NOTE — ED Provider Notes (Signed)
[facility] EMERGENCY DEPARTMENT Provider Note   CSN: [id number] Arrival date & time: 11/14/17  1217     History   Chief Complaint Chief Complaint  Patient presents with  . Chest Pain    HPI SHARMAYNE JABLON is a 77 y.o. female.  The history is provided by the patient. No language interpreter was used.  Chest Pain     LLEWELLYN SCHOENBERGER is a 77 y.o. female who presents to the Emergency Department complaining of chest pain, sob. She presents to the emergency department accompanied by her daughter for evaluation of chest tightness and shortness of breath. Over the last 2 to 3 days she is experienced increased upper chest and back tightness with associated shortness of breath. She also reports of bilateral lower extremity edema, left greater than right. Symptoms have been constant but worsen at times. She reports mild nonproductive cough. She denies any fevers, abdominal pain. She does have nausea and decreased oral intake. Her daughter is concerned that this is related to new medications that were started about three weeks ago. She was started on Depakote as well as meloxicam a few weeks ago for anxiety. She does have a history of severe anxiety as well as extensive vascular disease.  She takes aspirin and Plavix. Past Medical History:  Diagnosis Date  . Anxiety   . Atherosclerotic peripheral vascular disease ([place])   . CAD (coronary artery disease)    followed by dr [name].  . Celiac artery stenosis ([place])   . Colon polyps   . Community acquired pneumonia   . Depression   . Diverticulosis   . Dysuria   . GERD (gastroesophageal reflux disease)   . Hemorrhoids   . Hypertension   . Hypothyroidism   . Myocardial infarction ([place])   . Peripheral arterial disease ([place])   . Pyelonephritis   . Sepsis ([place])   . Sinus drainage    took z-pack   finished yesterday  . Urticaria   . Vertigo, benign positional     Patient Active Problem List   Diagnosis Date Noted  .  Pyelonephritis due to Escherichia coli 04/28/2015  . Bacteremia, escherichia coli 04/27/2015  . Elevated troponin 04/27/2015  . Acute respiratory failure with hypoxia ([place]) 04/26/2015  . Pneumonitis 04/26/2015  . Sepsis ([place]) 04/26/2015  . Metabolic acidosis 32/44/0102  . Thrombocytopenia ([place]) 04/26/2015  . Hypotension 04/26/2015  . Dysuria 04/26/2015  . OSA on CPAP 04/26/2015  . 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 LBP (low back pain) 04/26/2015  . Physical deconditioning 04/26/2015  . Sepsis with hypotension ([place]) 04/26/2015  . Critical lower limb ischemia 02/25/2015  . Hyperlipidemia 07/02/2014  . Carotid artery stenosis 03/26/2012  . Pleural effusion, left 12/19/2011  . NSTEMI (non-ST elevated myocardial infarction) ([place]) 12/19/2011  . 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 ABDOMINAL PAIN-RLQ 06/18/2010  . PERSONAL HX COLONIC POLYPS 06/18/2010  . GASTRITIS 12/09/2008  . Hypothyroidism 11/17/2008  . OBESITY, MODERATE 11/17/2008  . ANXIETY DEPRESSION 11/17/2008  . Essential hypertension 11/17/2008  . AMI 11/17/2008  . Coronary atherosclerosis 11/17/2008  . HEMORRHOIDS 11/17/2008  . GERD 11/17/2008  . IBS 11/17/2008    Past Surgical History:  Procedure Laterality Date  . ABDOMINAL AORTOGRAM W/LOWER EXTREMITY N/A 08/22/2017   Procedure: ABDOMINAL AORTOGRAM W/LOWER EXTREMITY;  Surgeon: Serafina Mitchell, MD;  Location: [facility];  Service: Cardiovascular;  Laterality: N/A;  . BILATERAL UPPER EXTREMITY ANGIOGRAM N/A 09/11/2012   Procedure: BILATERAL  UPPER EXTREMITY ANGIOGRAM;  Surgeon: Serafina Mitchell, MD;  Location: [facility] CATH LAB;  Service: Cardiovascular;  Laterality: N/A;  . carotid duplex doppler Bilateral 09/03/2012, 11/03/2011   Evidence of 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 patent left  common carotid- subclavian artery bypass with turbulent flow noted at the anastomosis with velocities of 295 cm/s  . CAROTID-SUBCLAVIAN BYPASS GRAFT  12/15/2011   Procedure: BYPASS GRAFT CAROTID-SUBCLAVIAN;  Surgeon: Serafina Mitchell, MD;  Location: [facility];  Service: Vascular;  Laterality: Left;  Left Carotid subclavian bypass  . CORONARY ANGIOPLASTY    . CORONARY ARTERY BYPASS GRAFT    . DOPPLER ECHOCARDIOGRAPHY  05/27/2010, 09/17/2008   Mild Proximal septal thickening is noted. Left ventricular systolic functions is normal ejection fraction =>55%. the aortic valve appears to be mildly sclerotic   . fem-fem bypass graft    . holter monitor  01/21/2008   The predominant rhythm was normal sinus rhythm. Minimum heartrate of 63 bpm at +01:00, maximum heartrate of 105 bpm at + 10:35; and the average heartrate of 75 bpm. Ventricular ectopic activity totaled 1270: Multifocal; 866-PVC's and 404-VEs             . JOINT REPLACEMENT     Left knee  . LEFT HEART CATHETERIZATION WITH CORONARY/GRAFT ANGIOGRAM N/A 12/21/2011   Procedure: LEFT HEART CATHETERIZATION WITH Beatrix Fetters;  Surgeon: Leonie Man, MD;  Location: [facility] CATH LAB;  Service: Cardiovascular;  Laterality: N/A;  . NM MYOCAR PERF EJECTION FRACTION  09/22/2009, 07/03/2007   the post stress myocardial perfusion images show a normal pattern of perfusion is all regions. The post-stress ejection fraction is 68 %. no significant wall motion abnormalities noted. This is a low risk scan.  Marland Kitchen PERIPHERAL VASCULAR INTERVENTION  08/22/2017   Procedure: PERIPHERAL VASCULAR INTERVENTION;  Surgeon: Serafina Mitchell, MD;  Location: [facility];  Service: Cardiovascular;;  Fem/Fem Graft  . REPLACEMENT TOTAL KNEE  05-2011  . UNILATERAL UPPER EXTREMEITY ANGIOGRAM Left 11/15/2011   Procedure: UNILATERAL UPPER EXTREMEITY ANGIOGRAM;  Surgeon: Lorretta Harp, MD;  Location: [facility] CATH LAB;  Service: Cardiovascular;  Laterality: Left;     OB History     Gravida  1   Para  1   Term      Preterm      AB      Living  1     SAB      TAB      Ectopic      Multiple      Live Births               Home Medications    Prior to Admission medications   Medication Sig Start Date End Date Taking? Authorizing Provider  amLODipine (NORVASC) 5 MG tablet Take 5 mg by mouth daily.    [provider]  aspirin EC 81 MG tablet Take 81 mg by mouth daily.    [provider]  Cholecalciferol (VITAMIN D) 2000 units tablet Take 2,000 Units by mouth daily.     [provider]  clopidogrel (PLAVIX) 75 MG tablet Take 1 tablet (75 mg total) by mouth once. 06/24/13   Lorretta Harp, MD  CRESTOR 10 MG tablet TAKE ONE TABLET BY MOUTH DAILY 02/06/13   Terance Ice, MD  levothyroxine (SYNTHROID, LEVOTHROID) 200 MCG tablet Take 200 mcg by mouth daily before breakfast.  12/08/14  [provider]  LORazepam (ATIVAN) 1 MG tablet Take 0.5 tablets (0.5 mg total) by mouth as needed for anxiety. For aniexty Patient taking differently: Take 0.5 mg by mouth as needed for anxiety.  12/29/11   Roczniak, Nancy Nordmann, PA-C  metoprolol tartrate (LOPRESSOR) 25 MG tablet Take 1 tablet (25 mg total) by mouth 2 (two) times daily. 04/30/15   Thurnell Lose, MD  sertraline (ZOLOFT) 100 MG tablet Take 100 mg by mouth 2 (two) times daily.  11/24/15   [provider]  tolterodine (DETROL) 1 MG tablet Take 1 mg by mouth daily.     [provider]  traMADol (ULTRAM) 50 MG tablet Take 50 mg by mouth every 12 (twelve) hours as needed for moderate pain.  02/24/15   [provider]  valsartan (DIOVAN) 80 MG tablet Take 80 mg by mouth every morning.     [provider]    Family History Family History  Problem Relation Age of Onset  . Heart attack Mother   . Heart disease Mother        before age 77  . Diabetes Father   . Heart disease Father        before age 19  . Hypertension Father   .  Hyperlipidemia Father   . Heart attack Father   . Colon cancer Brother   . Heart attack Brother   . Hyperlipidemia Brother   . Hypertension Brother   . Heart disease Brother        before age 76  . Diabetes Sister   . Heart disease Sister        before age 16  . Hyperlipidemia Sister   . Hypertension Sister   . Heart attack Sister     Social History Social History   Tobacco Use  . Smoking status: Former Smoker    Types: Cigarettes    Last attempt to quit: 11/27/1981    Years since quitting: 35.9  . Smokeless tobacco: Never Used  Substance Use Topics  . Alcohol use: Yes    Alcohol/week: 0.0 oz    Comment: Rare;only twice yearly  . Drug use: No     Allergies   Hydrocodone-acetaminophen; Other; Oxycodone; and Vicodin [hydrocodone-acetaminophen]   Review of Systems Review of Systems  Cardiovascular: Positive for chest pain.  All other systems reviewed and are negative.    Physical Exam Updated Vital Signs BP (!) 170/68   Pulse 78   Temp 98.7 F (37.1 C) (Oral)   Resp 18   Ht 5\' 2"  (1.575 m)   Wt 90.7 kg (200 lb)   SpO2 93%   BMI 36.58 kg/m   Physical Exam  Constitutional: She is oriented to person, place, and time. She appears well-developed and well-nourished.  HENT:  Head: Normocephalic and atraumatic.  Cardiovascular: Normal rate and regular rhythm.  No murmur heard. Pulmonary/Chest: Effort normal and breath sounds normal. No respiratory distress.  Abdominal: Soft. There is no tenderness. There is no rebound and no guarding.  Musculoskeletal: She exhibits no tenderness.  One plus edema to bilateral lower extremities, left greater than right  Neurological: She is alert and oriented to person, place, and time.  Skin: Skin is warm and dry.  Psychiatric: She has a normal mood and affect. Her behavior is normal.  Nursing note and vitals reviewed.    ED Treatments / Results  Labs (all labs ordered are listed, but only abnormal results are  displayed) Labs Reviewed  BASIC METABOLIC PANEL -  Abnormal; Notable for the following components:      Result Value   Glucose, Bld 112 (*)    All other components within normal limits  CBC - Abnormal; Notable for the following components:   Platelets 117 (*)    All other components within normal limits  BRAIN NATRIURETIC PEPTIDE  I-STAT TROPONIN, ED  I-STAT TROPONIN, ED    EKG None  Radiology Dg Chest 2 View  Result Date: 11/14/2017 CLINICAL DATA:  Shortness of breath, chest pain. EXAM: CHEST - 2 VIEW COMPARISON:  Radiograph of April 27, 2015. FINDINGS: The heart size and mediastinal contours are within normal limits. Atherosclerosis of thoracic aorta is noted. Status post coronary artery bypass graft. No pneumothorax is noted. Probable small left pleural effusion is noted. Both lungs are clear. Degenerative changes seen involving the right shoulder. IMPRESSION: Probable small left pleural effusion. No other significant abnormality seen in the chest. Aortic Atherosclerosis (ICD10-I70.0). Electronically Signed   By: Marijo Conception, M.D.   On: 11/14/2017 13:10    Procedures Procedures (including critical care time)  Medications Ordered in ED Medications - No data to display   Initial Impression / Assessment and Plan / ED Course  I have reviewed the triage vital signs and the nursing notes.  Pertinent labs & imaging results that were available during my care of the patient were reviewed by me and considered in my medical decision making (see chart for details).     Patient here for evaluation of progressive shortness of breath with chest tightness, lower extremity edema. BNP elevated compared to priors, pleural effusions on imaging. Oxygen sats in the low 90s on room air. Concern for CHF exacerbation, will treat with Lasix. CT is negative for PE. Hospitalist consulted for admission for further treatment.  Final Clinical Impressions(s) / ED Diagnoses   Final diagnoses:  None     ED Discharge Orders    None       Quintella Reichert, MD 11/15/17 0011

## 2017-11-15 ENCOUNTER — Encounter (HOSPITAL_COMMUNITY): Payer: Self-pay | Admitting: *Deleted

## 2017-11-15 ENCOUNTER — Inpatient Hospital Stay (HOSPITAL_COMMUNITY): Payer: Medicare HMO

## 2017-11-15 ENCOUNTER — Other Ambulatory Visit: Payer: Self-pay

## 2017-11-15 DIAGNOSIS — J9601 Acute respiratory failure with hypoxia: Secondary | ICD-10-CM | POA: Diagnosis not present

## 2017-11-15 DIAGNOSIS — I160 Hypertensive urgency: Secondary | ICD-10-CM

## 2017-11-15 DIAGNOSIS — I361 Nonrheumatic tricuspid (valve) insufficiency: Secondary | ICD-10-CM | POA: Diagnosis not present

## 2017-11-15 DIAGNOSIS — E033 Postinfectious hypothyroidism: Secondary | ICD-10-CM

## 2017-11-15 DIAGNOSIS — R079 Chest pain, unspecified: Secondary | ICD-10-CM

## 2017-11-15 DIAGNOSIS — I2510 Atherosclerotic heart disease of native coronary artery without angina pectoris: Secondary | ICD-10-CM | POA: Diagnosis not present

## 2017-11-15 DIAGNOSIS — G4733 Obstructive sleep apnea (adult) (pediatric): Secondary | ICD-10-CM | POA: Diagnosis not present

## 2017-11-15 DIAGNOSIS — I509 Heart failure, unspecified: Secondary | ICD-10-CM | POA: Diagnosis not present

## 2017-11-15 DIAGNOSIS — D696 Thrombocytopenia, unspecified: Secondary | ICD-10-CM

## 2017-11-15 DIAGNOSIS — I110 Hypertensive heart disease with heart failure: Secondary | ICD-10-CM | POA: Diagnosis not present

## 2017-11-15 DIAGNOSIS — Z9989 Dependence on other enabling machines and devices: Secondary | ICD-10-CM | POA: Diagnosis not present

## 2017-11-15 DIAGNOSIS — I5033 Acute on chronic diastolic (congestive) heart failure: Secondary | ICD-10-CM | POA: Diagnosis not present

## 2017-11-15 DIAGNOSIS — E039 Hypothyroidism, unspecified: Secondary | ICD-10-CM | POA: Diagnosis not present

## 2017-11-15 DIAGNOSIS — I10 Essential (primary) hypertension: Secondary | ICD-10-CM | POA: Diagnosis not present

## 2017-11-15 DIAGNOSIS — E785 Hyperlipidemia, unspecified: Secondary | ICD-10-CM

## 2017-11-15 DIAGNOSIS — G8929 Other chronic pain: Secondary | ICD-10-CM | POA: Diagnosis not present

## 2017-11-15 LAB — CBC
HEMATOCRIT: 42.4 % (ref 36.0–46.0)
Hemoglobin: 13.8 g/dL (ref 12.0–15.0)
MCH: 27.9 pg (ref 26.0–34.0)
MCHC: 32.5 g/dL (ref 30.0–36.0)
MCV: 85.8 fL (ref 78.0–100.0)
PLATELETS: 133 10*3/uL — AB (ref 150–400)
RBC: 4.94 MIL/uL (ref 3.87–5.11)
RDW: 13.3 % (ref 11.5–15.5)
WBC: 7.4 10*3/uL (ref 4.0–10.5)

## 2017-11-15 LAB — BASIC METABOLIC PANEL
Anion gap: 16 — ABNORMAL HIGH (ref 5–15)
BUN: 11 mg/dL (ref 6–20)
CHLORIDE: 95 mmol/L — AB (ref 101–111)
CO2: 27 mmol/L (ref 22–32)
Calcium: 9.4 mg/dL (ref 8.9–10.3)
Creatinine, Ser: 1.03 mg/dL — ABNORMAL HIGH (ref 0.44–1.00)
GFR, EST NON AFRICAN AMERICAN: 52 mL/min — AB (ref >60)
Glucose, Bld: 128 mg/dL — ABNORMAL HIGH (ref 65–99)
Potassium: 4 mmol/L (ref 3.5–5.1)
SODIUM: 138 mmol/L (ref 135–145)

## 2017-11-15 LAB — TROPONIN I: Troponin I: <0.03 ng/mL (ref <0.03)

## 2017-11-15 LAB — ECHOCARDIOGRAM COMPLETE
HEIGHTINCHES: 60 in
WEIGHTICAEL: 3340.8 [oz_av]

## 2017-11-15 MED ORDER — ROSUVASTATIN CALCIUM 10 MG PO TABS
10.0000 mg | ORAL_TABLET | Freq: Every day | ORAL | Status: DC
  Administered 2017-11-15 (×2): 10 mg via ORAL
  Filled 2017-11-15: qty 1

## 2017-11-15 MED ORDER — PERFLUTREN LIPID MICROSPHERE
1.0000 mL | INTRAVENOUS | Status: AC | PRN
  Administered 2017-11-15: 2 mL via INTRAVENOUS
  Filled 2017-11-15: qty 10

## 2017-11-15 NOTE — Consult Note (Addendum)
Cardiology Consultation:   Patient ID: Deanna [name]; [id number]; 28-Sep-1940   Admit date: 11/14/2017 Date of Consult: 11/15/2017  Primary Care Provider: Bartholome Bill, MD Primary Cardiologist: Quay Burow, MD  Primary Electrophysiologist:     Patient Profile:   Deanna [name] is a 77 y.o. 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 Rockne Menghini.  History of Present Illness:   Deanna [name] is known to this service and last saw Dr. Gwenlyn Found in clinic on 10/03/17. She has a complicated cardiac history. She has a history of coronary artery disease status post anterior wall myocardial infarction in 1994 treated with LAD angioplasty by Dr. [name]. She had circumflex intervention the following year. Ultimately she required coronary bypass grafting in 1999 by Dr. Arlyce Dice . He also performed left-to-right femorofemoral crossover grafting. Dr. Gwenlyn Found performed angiography on her 11/15/11 revealed a high-grade calcified proximal left subclavian artery stenosis not amenable to percutaneous intervention and she ultimately required left common carotid to subclavian artery bypass by Dr. Trula Slade. She had intervention on her femorofemoral crossover graft by Dr. Brabham1/15/19 with redo 08/29/17.  She has a strong family history of heart disease. She is a nonsmoker and is not diabetic.   Pt presented to the ER with a reported three day history of shortness of breath, chest pressure and lower extremity edema.  She also notes increased blood pressure recently. On arrival, her BNP was mildly elevated to 469 with a normal creatinine. CTA with bilateral pleural effusions and cardiomegaly. Systolic pressures were elevated to 180-190s.   On my interview, she states  she has been having chest pain for the past three months. She says that its intermittent, but then states that it lasts for days at a time. The pain is located in her central chest and radiates to her back. The pain occurs at rest and with exertion. No relieving or aggravating factors. The pain is associated with nausea and shortness of breath. She denies palpitations, dizziness, lightheadedness, headache, and syncope. She attempted to get help from her PCP, but then decided to come to the ER for evaluation.    Past Medical History:  Diagnosis Date  . AMI 11/17/2008   Qualifier: Diagnosis of  By: Marland Mcalpine    . Anxiety   . Atherosclerotic peripheral vascular disease ([place])   . Bacteremia, escherichia coli 04/27/2015  . CAD (coronary artery disease)    followed by dr [name].  . Celiac artery stenosis ([place])   . CHF (congestive heart failure) ([place]) 11/14/2017  . Colon polyps   . Community acquired pneumonia   . Depression   . Diverticulosis   . Dysuria   . GERD (gastroesophageal reflux disease)   . Hemorrhoids   . Hypertension   . Hypothyroidism   . Myocardial infarction ([place])   . Peripheral arterial disease ([place])   . Pneumonitis 04/26/2015  . Pyelonephritis   . Pyelonephritis due to Escherichia coli 04/28/2015  . Sepsis ([place])   . Sinus drainage    took z-pack   finished yesterday  . Urticaria   . Vertigo, benign positional     Past Surgical History:  Procedure Laterality Date  . ABDOMINAL AORTOGRAM W/LOWER EXTREMITY N/A 08/22/2017   Procedure: ABDOMINAL AORTOGRAM W/LOWER EXTREMITY;  Surgeon: Serafina Mitchell, MD;  Location: [place] [facility];  Service: Cardiovascular;  Laterality: N/A;  . BILATERAL UPPER EXTREMITY ANGIOGRAM N/A 09/11/2012   Procedure: BILATERAL UPPER EXTREMITY ANGIOGRAM;  Surgeon: Serafina Mitchell, MD;  Location: [facility] CATH LAB;  Service: Cardiovascular;  Laterality: N/A;  . carotid duplex doppler Bilateral 09/03/2012, 11/03/2011   Evidence of 40%-59% bilateral  internal carotid artery stenosis; however, velocities may be underestimated due to calcific plaque with acoustic shadowing which makes doppler interrogation difficult. patent left common carotid- subclavian artery bypass with turbulent flow noted at the anastomosis with velocities of 295 cm/s  . CAROTID-SUBCLAVIAN BYPASS GRAFT  12/15/2011   Procedure: BYPASS GRAFT CAROTID-SUBCLAVIAN;  Surgeon: Serafina Mitchell, MD;  Location: [facility] OR;  Service: Vascular;  Laterality: Left;  Left Carotid subclavian bypass  . CORONARY ANGIOPLASTY    . CORONARY ARTERY BYPASS GRAFT    . DOPPLER ECHOCARDIOGRAPHY  05/27/2010, 09/17/2008   Mild Proximal septal thickening is noted. Left ventricular systolic functions is normal ejection fraction =>55%. the aortic valve appears to be mildly sclerotic   . fem-fem bypass graft    . holter monitor  01/21/2008   The predominant rhythm was normal sinus rhythm. Minimum heartrate of 63 bpm at +01:00, maximum heartrate of 105 bpm at + 10:35; and the average heartrate of 75 bpm. Ventricular ectopic activity totaled 1270: Multifocal; 866-PVC's and 404-VEs             . JOINT REPLACEMENT     Left knee  . LEFT HEART CATHETERIZATION WITH CORONARY/GRAFT ANGIOGRAM N/A 12/21/2011   Procedure: LEFT HEART CATHETERIZATION WITH Beatrix Fetters;  Surgeon: Leonie Man, MD;  Location: [facility] CATH LAB;  Service: Cardiovascular;  Laterality: N/A;  . NM MYOCAR PERF EJECTION FRACTION  09/22/2009, 07/03/2007   the post stress myocardial perfusion images show a normal pattern of perfusion is all regions. The post-stress ejection fraction is 68 %. no significant wall motion abnormalities noted. This is a low risk scan.  Marland Kitchen PERIPHERAL VASCULAR INTERVENTION  08/22/2017   Procedure: PERIPHERAL VASCULAR INTERVENTION;  Surgeon: Serafina Mitchell, MD;  Location: [place] [facility];  Service: Cardiovascular;;  Fem/Fem Graft  . REPLACEMENT TOTAL KNEE  05-2011  . UNILATERAL UPPER EXTREMEITY ANGIOGRAM Left 11/15/2011     Procedure: UNILATERAL UPPER EXTREMEITY ANGIOGRAM;  Surgeon: Lorretta Harp, MD;  Location[facility] LAB;  Service: Cardiovascular;  Laterality: Left;     Home Medications:  Prior to Admission medications   Medication Sig Start Date End Date Taking? Authorizing Provider  acetaminophen (TYLENOL) 325 MG tablet Take 325 mg by mouth every 6 (six) hours as needed (for pain).   Yes [provider]  amLODipine (NORVASC) 5 MG tablet Take 5 mg by mouth daily.   Yes [provider]  aspirin EC 81 MG tablet Take 81 mg by mouth at bedtime.    Yes [provider]  Cholecalciferol (VITAMIN D-3) 1000 units CAPS Take 2,000 Units by mouth at bedtime.   Yes [provider]  clopidogrel (PLAVIX) 75 MG tablet Take 1 tablet (75 mg total) by mouth once. Patient taking differently: Take 75 mg by mouth every evening.  06/24/13  Yes Lorretta Harp, MD  CRESTOR 10 MG tablet TAKE ONE TABLET BY MOUTH DAILY Patient taking differently: Take 10 mg by mouth at bedtime 02/06/13  Yes Terance Ice, MD  latanoprost (XALATAN) 0.005 % ophthalmic solution Place 1 drop into both eyes at bedtime. 11/09/17  Yes [provider]  levothyroxine (SYNTHROID, LEVOTHROID) 200 MCG tablet Take 200 mcg by mouth daily before breakfast.  12/08/14  Yes [provider]  LORazepam (ATIVAN) 1 MG tablet Take 0.5 tablets (0.5 mg total) by mouth as needed for anxiety. For aniexty Patient taking differently: Take 1 mg by mouth daily as needed for anxiety.  12/29/11  Yes Roczniak, [name] J, PA-C  metoprolol tartrate (LOPRESSOR) 50 MG tablet Take 25 mg by mouth 2 (two) times daily.    Yes [provider]  mirabegron ER (MYRBETRIQ) 25 MG TB24 tablet Take 25 mg by mouth daily.   Yes [provider]  omeprazole (PRILOSEC) 40 MG capsule Take 40 mg by mouth daily.   Yes [provider]  ranitidine (ZANTAC) 150 MG capsule Take 150 mg by mouth daily as needed for heartburn.    Yes  [provider]  sertraline (ZOLOFT) 100 MG tablet Take 100 mg by mouth at bedtime.  11/24/15  Yes [provider]  Thiamine HCl (VITAMIN B-1) 250 MG tablet Take 250 mg by mouth daily.   Yes [provider]  tiZANidine (ZANAFLEX) 2 MG tablet Take 2 mg by mouth every 6 (six) hours as needed for muscle spasms.   Yes [provider]  traMADol (ULTRAM) 50 MG tablet Take 50 mg by mouth every 6 (six) hours as needed (for pain).  02/24/15  Yes [provider]  valsartan (DIOVAN) 80 MG tablet Take 80 mg by mouth every morning.    Yes [provider]  divalproex (DEPAKOTE ER) 250 MG 24 hr tablet Take 250 mg by mouth daily.    [provider]  meloxicam (MOBIC) 15 MG tablet Take 15 mg by mouth daily.    [provider]  metoprolol tartrate (LOPRESSOR) 25 MG tablet Take 1 tablet (25 mg total) by mouth 2 (two) times daily. Patient not taking: Reported on 11/14/2017 04/30/15   Thurnell Lose, MD    Inpatient Medications: Scheduled Meds: . amLODipine  5 mg Oral Daily  . aspirin EC  81 mg Oral QHS  . clopidogrel  75 mg Oral QPM  . enoxaparin (LOVENOX) injection  40 mg Subcutaneous Q24H  . famotidine  20 mg Oral BID  . furosemide  40 mg Intravenous Q12H  . irbesartan  75 mg Oral Daily  . latanoprost  1 drop Both Eyes QHS  . levothyroxine  200 mcg Oral QAC breakfast  . metoprolol tartrate  25 mg Oral BID  . mirabegron ER  25 mg Oral Daily  . pantoprazole  40 mg Oral Daily  . potassium chloride  10 mEq Oral Daily  . rosuvastatin  10 mg Oral q1800  . sertraline  100 mg Oral QHS  . vitamin B-1  250 mg Oral Daily   Continuous Infusions:  PRN Meds: hydrALAZINE, LORazepam, ondansetron **OR** ondansetron (ZOFRAN) IV, tiZANidine, traMADol  Allergies:    Allergies  Allergen Reactions  . Donepezil Other (See Comments)    Altered mood, aggression, and caused anger  . Hydrocodone-Acetaminophen Other (See Comments)    "Delirium,  confusion, toxic dementia"  . Memantine Other (See Comments)    Caused severe aggression  . Oxycodone Other (See Comments)    Toxic dementia  . Vicodin [Hydrocodone-Acetaminophen] Other (See Comments)    Delirium, confusion, and toxic dementia    Social History:   Social History   Socioeconomic History  . Marital status: Divorced    Spouse name: Not on file  . Number of children: Not on file  .  Years of education: Not on file  . Highest education level: Not on file  Occupational History  . Not on file  Social Needs  . Financial resource strain: Not on file  . Food insecurity:    Worry: Not on file    Inability: Not on file  . Transportation needs:    Medical: Not on file    Non-medical: Not on file  Tobacco Use  . Smoking status: Former Smoker    Types: Cigarettes    Last attempt to quit: 11/27/1981    Years since quitting: 35.9  . Smokeless tobacco: Never Used  Substance and Sexual Activity  . Alcohol use: Yes    Alcohol/week: 0.0 oz    Comment: Rare;only twice yearly  . Drug use: No  . Sexual activity: Never    Comment: 1st intercourse 55 yo-1 partner  Lifestyle  . Physical activity:    Days per week: Not on file    Minutes per session: Not on file  . Stress: Not on file  Relationships  . Social connections:    Talks on phone: Not on file    Gets together: Not on file    Attends religious service: Not on file    Active member of club or organization: Not on file    Attends meetings of clubs or organizations: Not on file    Relationship status: Not on file  . Intimate partner violence:    Fear of current or ex partner: Not on file    Emotionally abused: Not on file    Physically abused: Not on file    Forced sexual activity: Not on file  Other Topics Concern  . Not on file  Social History Narrative  . Not on file    Family History:    Family History  Problem Relation Age of Onset  . Heart attack Mother   . Heart disease Mother        before age 77   . Diabetes Father   . Heart disease Father        before age 63  . Hypertension Father   . Hyperlipidemia Father   . Heart attack Father   . Colon cancer Brother   . Heart attack Brother   . Hyperlipidemia Brother   . Hypertension Brother   . Heart disease Brother        before age 25  . Diabetes Sister   . Heart disease Sister        before age 14  . Hyperlipidemia Sister   . Hypertension Sister   . Heart attack Sister      ROS:  Please see the history of present illness.   All other ROS reviewed and negative.     Physical Exam/Data:   Vitals:   11/15/17 0038 11/15/17 0048 11/15/17 0510 11/15/17 1223  BP: (!) 179/72  (!) 117/43 (!) 115/47  Pulse: 78  70 78  Resp: 19     Temp: 98.2 F (36.8 C)   98.2 F (36.8 C)  TempSrc: Oral   Oral  SpO2: 92%  94% 92%  Weight:  208 lb 12.8 oz (94.7 kg)    Height:  5' (1.524 m)      Intake/Output Summary (Last 24 hours) at 11/15/2017 1235 Last data filed at 11/15/2017 0512 Gross per 24 hour  Intake -  Output 200 ml  Net -200 ml   Filed Weights   11/14/17 1223 11/15/17 0048  Weight: 200 lb (90.7 kg) 208  lb 12.8 oz (94.7 kg)   Body mass index is 40.78 kg/m.  General:  Well nourished, well developed, in no acute distress HEENT: normal Neck: no JVD Vascular: No carotid bruits  Cardiac:  normal S1, S2; RRR; no murmur Lungs:  clear to auscultation bilaterally, no wheezing, rhonchi or rales  Abd: soft, nontender, no hepatomegaly  Ext: no edema Musculoskeletal:  No deformities, BUE and BLE strength normal and equal Skin: warm and dry  Neuro:  CNs 2-12 intact, no focal abnormalities noted Psych:  Normal affect   EKG:  The EKG was personally reviewed and demonstrates:  Sinus rhythm, now with QTc prolongation Telemetry:  Telemetry was personally reviewed and demonstrates:  sinus  Relevant CV Studies:  Echo 11/15/17: Study Conclusions - Left ventricle: The cavity size was normal. There was mild   concentric hypertrophy.  Systolic function was vigorous. The   estimated ejection fraction was in the range of 65% to 70%. Wall   motion was normal; there were no regional wall motion   abnormalities. Doppler parameters are consistent with abnormal   left ventricular relaxation (grade 1 diastolic dysfunction).   There was no evidence of elevated ventricular filling pressure by   Doppler parameters. - Aortic valve: There was no regurgitation. - Mitral valve: Calcified annulus. Mildly thickened leaflets .   There was no regurgitation. - Left atrium: The atrium was normal in size. - Right ventricle: The cavity size was normal. Wall thickness was   normal. Systolic function was normal. - Right atrium: The atrium was normal in size. - Tricuspid valve: There was mild regurgitation. - Pulmonary arteries: Systolic pressure was within the normal   range. - Inferior vena cava: The vessel was normal in size. - Pericardium, extracardiac: There was no pericardial effusion.   Left heart cath 12/2011  Culprit lesion is most likely the apparently recently occluded SVG-RPDA; not amenable to PCI.  Widely patent SVG-D3 that actually provides brisk competitive flow to the distal LAD as noted in 2002.  Widely patent SVG-OM.  Severe mid LAD lesion that does not appear to be changed from 2002 catheterization, protected by SVG-D3.  Mostly preserved LV function with mild basal anterior, but more prominent basal inferior hypokinesis.  Widely patent Fem-Fem bypass graft.  Plan:  Transfer to telemetry for overnight monitoring & BP control  Can d/c heparin as SVG is occluded & unlikely to benefit from anticoagulation; however, will convert to SQ Lovenox for 24-48 hrs more coverage.  As discussed with Dr. Trula Slade, who will see the patient either later today or in the AM, he agrees with initiating oral Antibiotics for the concern over wound drainage.  Will need to aggressively titrate BP medications - increased ARB, next would  increase Amlodipine.   The case and results was discussed with the patient (and family) -- multiple social concerns expressed by the daughter & only caregiver -- need FMLA paperwork signed.  Patient will most likely need short term rehab at a facility, after which, the daughter would like to move her mother closer to her in [place], [place].  The case and results will be forwarded to the patient's PCP.  The case and results was discussed with the patient's Cardiologist.   Laboratory Data:  Chemistry Recent Labs  Lab 11/14/17 1303 11/14/17 2217 11/15/17 0417  NA 137  --  138  K 4.3  --  4.0  CL 103  --  95*  CO2 22  --  27  GLUCOSE 112*  --  128*  BUN 12  --  11  CREATININE 0.83 0.92 1.03*  CALCIUM 9.5  --  9.4  GFRNONAA >60 59* 52*  GFRAA >60 >60 >60  ANIONGAP 12  --  16*    No results for input(s): PROT, ALBUMIN, AST, ALT, ALKPHOS, BILITOT in the last 168 hours. Hematology Recent Labs  Lab 11/14/17 1303 11/14/17 2217 11/15/17 0417  WBC 6.0 5.8 7.4  RBC 4.94 5.14* 4.94  HGB 14.0 14.7 13.8  HCT 42.1 43.5 42.4  MCV 85.2 84.6 85.8  MCH 28.3 28.6 27.9  MCHC 33.3 33.8 32.5  RDW 13.4 13.2 13.3  PLT 117* 121* 133*   Cardiac Enzymes Recent Labs  Lab 11/14/17 2217 11/15/17 0417 11/15/17 0748  TROPONINI <0.03 <0.03 <0.03    Recent Labs  Lab 11/14/17 1318 11/14/17 1845  TROPIPOC 0.00 0.00    BNP Recent Labs  Lab 11/14/17 1830  BNP 469.8*    DDimer No results for input(s): DDIMER in the last 168 hours.  Radiology/Studies:  Dg Chest 2 View  Result Date: 11/14/2017 CLINICAL DATA:  Shortness of breath, chest pain. EXAM: CHEST - 2 VIEW COMPARISON:  Radiograph of April 27, 2015. FINDINGS: The heart size and mediastinal contours are within normal limits. Atherosclerosis of thoracic aorta is noted. Status post coronary artery bypass graft. No pneumothorax is noted. Probable small left pleural effusion is noted. Both lungs are clear. Degenerative changes seen  involving the right shoulder. IMPRESSION: Probable small left pleural effusion. No other significant abnormality seen in the chest. Aortic Atherosclerosis (ICD10-I70.0). Electronically Signed   By: Marijo Conception, M.D.   On: 11/14/2017 13:10   Ct Angio Chest Pe W/cm &/or Wo Cm  Result Date: 11/14/2017 CLINICAL DATA:  Chest pain and foot swelling for 2 days. EXAM: CT ANGIOGRAPHY CHEST WITH CONTRAST TECHNIQUE: Multidetector CT imaging of the chest was performed using the standard protocol during bolus administration of intravenous contrast. Multiplanar CT image reconstructions and MIPs were obtained to evaluate the vascular anatomy. CONTRAST:  100 ml ISOVUE-370 IOPAMIDOL (ISOVUE-370) INJECTION 76% COMPARISON:  PA and lateral chest this same day. CT chest 04/26/2015. FINDINGS: Cardiovascular: No pulmonary embolus is identified. The patient is status post CABG. There is cardiomegaly. Extensive calcific aortic and coronary atherosclerosis are noted. No aortic aneurysm. Mediastinum/Nodes: No enlarged mediastinal, hilar, or axillary lymph nodes. Thyroid gland, trachea, and esophagus demonstrate no significant findings. Lungs/Pleura: Small bilateral pleural effusions are seen. The lungs demonstrate mild dependent atelectasis but are otherwise unremarkable. Upper Abdomen: Negative. Musculoskeletal: The patient has severe multilevel degenerative disc disease. Autologous fusion is seen across the disc interspaces from T9-T12. No acute bony abnormality. Review of the MIP images confirms the above findings. IMPRESSION: Negative for pulmonary embolus. Small bilateral pleural effusions. Cardiomegaly. Status post CABG.  Extensive coronary atherosclerosis noted. Severe multilevel thoracic spondylosis. Aortic Atherosclerosis (ICD10-I70.0). Electronically Signed   By: Inge Rise M.D.   On: 11/14/2017 20:06    Assessment and Plan:   1.  Chest pain, known CAD s/p CABG and LHC - troponin x 5 negative - EKG without acute  changes - echo with normal LVEF of 60-65% and grade 1 DD, no RWMA - On ASA and plavix, crestor, BB The patient describes atypical and typical features of chest pain. Given her known disease and questionable access (s/p fem-fem, subclavian steal bypass, CABG), will obtain a lexiscan myoview tomorrow. If this shows reversible ischemia, will consult interventionalists for heart cath approach. NPO at MN.    2. HTN urgency - pressures  on arrival in the 180-190s - norvasc, ARB, lopressor - pressures improved to the 110s   3. HLD Continue crestor. LDL goal less than 70.   4. Sublcavian steal syndrome s/p bypass 12/2011 5. Fem-fem bypas 08/2017 - continue ASA and plavix, stable   6. Chronic diastolic heart failure - Primary started 40 mg IV lasix BID - she appears euvolemic on exam and I do not appreciate lower extremity edema - ok to D/C lasix for now, re-evaluate daily    For questions or updates, please contact [facility] Please consult [email or web address] for contact info under Cardiology/STEMI.   Signed, Ledora Bottcher, [place]  11/15/2017 12:35 PM   Attending Note:   The patient was seen and examined.  Agree with assessment and plan as noted above.  Changes made to the above note as needed.  Patient seen and independently examined with Doreene Adas , PA .   We discussed all aspects of the encounter. I agree with the assessment and plan as stated above.  Chest discomfort: The patient presents with chest heaviness.  She stated that it lasted for several days and despite that her enzymes are negative.  He did not try nitroglycerin.  She has severe peripheral vascular disease.  Performing a heart catheterization would be a bit of a challenge.  We would have to go from the left leg.  I think her best option is to proceed with a [place] study.  If the study is normal or low risk, I think that she can be safely discharged home.  If she has significant defects then we will have  to proceed with heart catheterization.   I have spent a total of 40 minutes with patient reviewing hospital  notes , telemetry, EKGs, labs and examining patient as well as establishing an assessment and plan that was discussed with the patient. > 50% of time was spent in direct patient care.    Thayer Headings, Brooke Bonito., MD, [facility] 11/15/2017, 2:35 PM [address],  [place] Pager [phone]

## 2017-11-15 NOTE — Plan of Care (Signed)
Nutrition Education Note  RD consulted for nutrition education regarding weight loss.   Pt was admitted with CHF, so focus on education was pertaining to nutrition recommendations for heart failure.   Spoke with pt, who reports contemplating losing weight and was instructed to consumes 4-6 small meals daily. Pt reports that she typically eats a large breakfast, moderate lunch, and small dinner. She shares that she eats large portions, but has been paying attention to meal trays to help her visualize appropriate portion sizes.   Pt cooks at home, but struggles mostly when she eats out, due to high sodium contents in restaurant foods. Discussed healthier choices when eating out. Also encouraged pt to consume high fiber foods to assist with satiety. Also discussed low calorie beverage options.  RD provided "Low Sodium Nutrition Therapy" handout from the Academy of [employer]. Reviewed patient's dietary recall. Provided examples on ways to decrease sodium intake in diet. Discouraged intake of processed foods and use of salt shaker. Encouraged fresh fruits and vegetables as well as whole grain sources of carbohydrates to maximize fiber intake.   RD discussed why it is important for patient to adhere to diet recommendations, and emphasized the role of fluids, foods to avoid, and importance of weighing self daily. Teach back method used.  Expect fair to good compliance.  Body mass index is 40.78 kg/m. Pt meets criteria for morbid obesity, based on current BMI.  Current diet order is Heart Healthy, patient is consuming approximately 75-100% of meals at this time. Labs and medications reviewed. No further nutrition interventions warranted at this time. RD contact information provided. If additional nutrition issues arise, please re-consult RD.   Abdullah Rizzi A. Jimmye Norman, RD, LDN, CDE Pager: [phone] After hours Pager: [phone]

## 2017-11-15 NOTE — Progress Notes (Signed)
Progress Note    Deanna [name]  CBJ:[id number] DOB: 28-Jan-1941  DOA: 11/14/2017 PCP: Bartholome Bill, MD    Brief Narrative:   Chief complaint: F/U chest pain and SOB  Medical records reviewed and are as summarized below:  Deanna [name] is an 77 y.o. 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 Upon initial evaluation in the ED, BNP was 469 and CT angiogram of the chest showed small bilateral pleural effusions and cardiomegaly. EKG was nonacute. Blood pressure was elevated.  Assessment/Plan:   Principal Problem:   Acute CHF (congestive heart failure) (HCC)/acute respiratory failure with hypoxia The patient has not had a recent echocardiogram, and 2-D echo from 04/26/15 showed normal EF of 60-65 percent, akinesis of the basal inferior myocardium, and grade 1 diastolic dysfunction. She was given 20 mg of Lasix in the ED and started on Lasix 40 mg every 12 hours by the admitting physician. Chest x-ray and CT endocrine personally reviewed and shows small bilateral pleural effusions, worse on right. Repeat 2-D echo ordered. Troponins negative 2. Continue to monitor on telemetry. Feels better with diuresis.  Active Problems:   Thrombocytopenia Mild, chronic.    GERD On both PPI and H2 blocker therapy.    Chronic pain Continue tramadol. Meloxicam discontinued.    History of depression Continue Zoloft. Depakote currently on hold.    Hyperlipidemia Continue statin.    H/O CAD s/p CABG Continue aspirin and Plavix, Crestor, beta blocker. Continue to monitor on telemetry.    Hypothyroidism Continue synthroid. TSH WNL.    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    OSA on CPAP Continue CPAP daily at bedtime.    Hypertensive urgency Continue Norvasc, Avapro, metoprolol and when necessary hydralazine. Blood pressure improved.    Morbid obesity with comorbidities Body mass index is 40.78 kg/m. Consult dietitian for weight loss education.   Family Communication/Anticipated D/C date and plan/Code Status   DVT prophylaxis: Lovenox ordered. Code Status: Full Code.  Family Communication: No family at the bedside. Disposition Plan: Home 11/16/17 depending on 2D echo results.   Medical Consultants:    None.   Anti-Infectives:    None  Subjective:   Patient reports that she is feeling better and has diuresed well through the night. No current complaints of chest pain but she continues to have some shortness of breath. No cough. No fevers. Lower extremity edema has improved.  Objective:    Vitals:   11/14/17 2339 11/15/17 0038 11/15/17 0048 11/15/17 0510  BP:  (!) 179/72  (!) 117/43  Pulse: 77 78  70  Resp: 12 19    Temp:  98.2 F (36.8 C)    TempSrc:  Oral    SpO2: 95% 92%  94%  Weight:   94.7 kg (208 lb 12.8 oz)   Height:   5' (1.524 m)     Intake/Output Summary (Last 24 hours) at 11/15/2017 0839 Last data filed at 11/15/2017 0512 Gross per 24 hour  Intake -  Output 200 ml  Net -200 ml   Filed Weights   11/14/17 1223 11/15/17 0048  Weight: 90.7 kg (200 lb) 94.7 kg (208 lb 12.8 oz)    Exam: General: Chronically ill-appearing  female in no acute distress. Cardiovascular: Heart sounds show a regular rate, and rhythm. No gallops or rubs. No murmurs. No JVD. Lungs: Diminished throughout with a few bibasilar crackles.  Abdomen: Soft, nontender, nondistended with normal active bowel sounds. No masses. No hepatosplenomegaly. Neurological: Alert and oriented 3. Moves all extremities 4 with equal strength. Cranial nerves II through XII grossly intact. Skin: Warm and dry. No rashes or lesions. Extremities: No  clubbing or cyanosis. 1+ edema. Pedal pulses 2+. Psychiatric: Mood and affect are normal. Insight and judgment are normal.   Data Reviewed:   I have personally reviewed following labs and imaging studies:  Labs: Labs show the following:   Basic Metabolic Panel: Recent Labs  Lab 11/14/17 1303 11/14/17 2217 11/15/17 0417  NA 137  --  138  K 4.3  --  4.0  CL 103  --  95*  CO2 22  --  27  GLUCOSE 112*  --  128*  BUN 12  --  11  CREATININE 0.83 0.92 1.03*  CALCIUM 9.5  --  9.4  MG  --  2.0  --    GFR Estimated Creatinine Clearance: 48.6 mL/min (A) (by C-G formula based on SCr of 1.03 mg/dL (H)).  CBC: Recent Labs  Lab 11/14/17 1303 11/14/17 2217 11/15/17 0417  WBC 6.0 5.8 7.4  HGB 14.0 14.7 13.8  HCT 42.1 43.5 42.4  MCV 85.2 84.6 85.8  PLT 117* 121* 133*   Cardiac Enzymes: Recent Labs  Lab 11/14/17 2217 11/15/17 0417  TROPONINI <0.03 <0.03   Thyroid function studies: Recent Labs    11/14/17 2217  TSH 4.355   Microbiology No results found for this or any previous visit (from the past 240 hour(s)).  Procedures and diagnostic studies:  Dg Chest 2 View  Result Date: 11/14/2017 CLINICAL DATA:  Shortness of breath, chest pain. EXAM: CHEST - 2 VIEW COMPARISON:  Radiograph of April 27, 2015. FINDINGS: The heart size and mediastinal contours are within normal limits. Atherosclerosis of thoracic aorta is noted. Status post coronary artery bypass graft. No pneumothorax is noted. Probable small left pleural effusion is noted. Both lungs are clear. Degenerative changes seen involving the right shoulder. IMPRESSION: Probable small left pleural effusion. No other significant abnormality seen in the chest. Aortic Atherosclerosis (ICD10-I70.0). Electronically Signed   By: Marijo Conception, M.D.   On: 11/14/2017 13:10   Ct Angio Chest Pe W/cm &/or Wo Cm  Result Date: 11/14/2017 CLINICAL DATA:  Chest pain and foot swelling for 2 days. EXAM: CT ANGIOGRAPHY CHEST WITH  CONTRAST TECHNIQUE: Multidetector CT imaging of the chest was performed using the standard protocol during bolus administration of intravenous contrast. Multiplanar CT image reconstructions and MIPs were obtained to evaluate the vascular anatomy. CONTRAST:  100 ml ISOVUE-370 IOPAMIDOL (ISOVUE-370) INJECTION 76% COMPARISON:  PA and lateral chest this same day. CT chest 04/26/2015. FINDINGS: Cardiovascular: No pulmonary embolus is identified. The patient is status post CABG. There is cardiomegaly. Extensive calcific aortic and coronary atherosclerosis are noted. No aortic aneurysm. Mediastinum/Nodes: No enlarged mediastinal, hilar, or axillary lymph nodes. Thyroid gland, trachea, and esophagus demonstrate no significant findings. Lungs/Pleura: Small bilateral pleural effusions are seen. The lungs demonstrate mild dependent atelectasis but are otherwise unremarkable. Upper Abdomen: Negative. Musculoskeletal: The patient has severe multilevel degenerative disc disease. Autologous fusion is seen across the disc interspaces from T9-T12. No acute bony abnormality. Review of the MIP images confirms the above findings. IMPRESSION: Negative for pulmonary embolus. Small bilateral pleural effusions. Cardiomegaly. Status  post CABG.  Extensive coronary atherosclerosis noted. Severe multilevel thoracic spondylosis. Aortic Atherosclerosis (ICD10-I70.0). Electronically Signed   By: Inge Rise M.D.   On: 11/14/2017 20:06    Medications:   . amLODipine  5 mg Oral Daily  . aspirin EC  81 mg Oral QHS  . clopidogrel  75 mg Oral QPM  . enoxaparin (LOVENOX) injection  40 mg Subcutaneous Q24H  . famotidine  20 mg Oral BID  . furosemide  40 mg Intravenous Q12H  . irbesartan  75 mg Oral Daily  . latanoprost  1 drop Both Eyes QHS  . levothyroxine  200 mcg Oral QAC breakfast  . metoprolol tartrate  25 mg Oral BID  . mirabegron ER  25 mg Oral Daily  . pantoprazole  40 mg Oral Daily  . potassium chloride  10 mEq Oral Daily   . rosuvastatin  10 mg Oral q1800  . sertraline  100 mg Oral QHS  . vitamin B-1  250 mg Oral Daily   Continuous Infusions:   LOS: 1 day   Jacquelynn Cree  [facility] Pager [phone]. If unable to reach me by pager, please call my cell phone at [phone].  *Please refer to [email or web address], password [email or web address] to get updated schedule on who will round on this patient, as hospitalists switch teams weekly. If 7PM-7AM, please contact night-coverage at [email or web address], password [email or web address] for any overnight needs.  11/15/2017, 8:39 AM

## 2017-11-15 NOTE — Progress Notes (Signed)
  Echocardiogram 2D Echocardiogram has been performed.  Jennette Dubin 11/15/2017, 10:04 AM

## 2017-11-16 ENCOUNTER — Observation Stay (HOSPITAL_BASED_OUTPATIENT_CLINIC_OR_DEPARTMENT_OTHER): Payer: Medicare HMO

## 2017-11-16 DIAGNOSIS — I509 Heart failure, unspecified: Secondary | ICD-10-CM | POA: Diagnosis not present

## 2017-11-16 DIAGNOSIS — I5033 Acute on chronic diastolic (congestive) heart failure: Secondary | ICD-10-CM | POA: Diagnosis not present

## 2017-11-16 DIAGNOSIS — I2510 Atherosclerotic heart disease of native coronary artery without angina pectoris: Secondary | ICD-10-CM

## 2017-11-16 DIAGNOSIS — G8929 Other chronic pain: Secondary | ICD-10-CM | POA: Diagnosis not present

## 2017-11-16 DIAGNOSIS — I10 Essential (primary) hypertension: Secondary | ICD-10-CM | POA: Diagnosis not present

## 2017-11-16 DIAGNOSIS — I5032 Chronic diastolic (congestive) heart failure: Secondary | ICD-10-CM | POA: Diagnosis present

## 2017-11-16 DIAGNOSIS — N179 Acute kidney failure, unspecified: Secondary | ICD-10-CM | POA: Diagnosis not present

## 2017-11-16 DIAGNOSIS — J9601 Acute respiratory failure with hypoxia: Secondary | ICD-10-CM | POA: Diagnosis not present

## 2017-11-16 LAB — BASIC METABOLIC PANEL
ANION GAP: 12 (ref 5–15)
BUN: 27 mg/dL — ABNORMAL HIGH (ref 6–20)
CHLORIDE: 98 mmol/L — AB (ref 101–111)
CO2: 28 mmol/L (ref 22–32)
Calcium: 9 mg/dL (ref 8.9–10.3)
Creatinine, Ser: 1.59 mg/dL — ABNORMAL HIGH (ref 0.44–1.00)
GFR calc non Af Amer: 31 mL/min — ABNORMAL LOW (ref >60)
GFR, EST AFRICAN AMERICAN: 36 mL/min — AB (ref >60)
Glucose, Bld: 132 mg/dL — ABNORMAL HIGH (ref 65–99)
POTASSIUM: 4.3 mmol/L (ref 3.5–5.1)
SODIUM: 138 mmol/L (ref 135–145)

## 2017-11-16 LAB — NM MYOCAR MULTI W/SPECT W/WALL MOTION / EF
CHL CUP RESTING HR STRESS: 78 {beats}/min
Exercise duration (min): 4 min

## 2017-11-16 MED ORDER — REGADENOSON 0.4 MG/5ML IV SOLN
INTRAVENOUS | Status: AC
  Filled 2017-11-16: qty 5

## 2017-11-16 MED ORDER — FUROSEMIDE 40 MG PO TABS: 40.0000 mg | ORAL_TABLET | Freq: Every day | ORAL | 11 refills | Status: DC | PRN

## 2017-11-16 MED ORDER — TECHNETIUM TC 99M TETROFOSMIN IV KIT
10.0000 | PACK | Freq: Once | INTRAVENOUS | Status: AC | PRN
  Administered 2017-11-16: 10 via INTRAVENOUS

## 2017-11-16 MED ORDER — TECHNETIUM TC 99M TETROFOSMIN IV KIT
30.0000 | PACK | Freq: Once | INTRAVENOUS | Status: AC | PRN
  Administered 2017-11-16: 30 via INTRAVENOUS

## 2017-11-16 NOTE — Progress Notes (Addendum)
Progress Note  Patient Name: Deanna [name] Date of Encounter: 11/16/2017  Primary Cardiologist: Quay Burow, MD   Subjective   No chest pain at rest  Inpatient Medications    Scheduled Meds: . amLODipine  5 mg Oral Daily  . aspirin EC  81 mg Oral QHS  . clopidogrel  75 mg Oral QPM  . enoxaparin (LOVENOX) injection  40 mg Subcutaneous Q24H  . famotidine  20 mg Oral BID  . irbesartan  75 mg Oral Daily  . latanoprost  1 drop Both Eyes QHS  . levothyroxine  200 mcg Oral QAC breakfast  . metoprolol tartrate  25 mg Oral BID  . mirabegron ER  25 mg Oral Daily  . pantoprazole  40 mg Oral Daily  . potassium chloride  10 mEq Oral Daily  . regadenoson      . rosuvastatin  10 mg Oral q1800  . sertraline  100 mg Oral QHS  . vitamin B-1  250 mg Oral Daily   Continuous Infusions:  PRN Meds: hydrALAZINE, LORazepam, ondansetron **OR** ondansetron (ZOFRAN) IV, tiZANidine, traMADol   Vital Signs    Vitals:   11/15/17 1916 11/16/17 0535 11/16/17 0802 11/16/17 0941  BP: (!) 112/55 131/73 (!) 130/53 (!) 154/89  Pulse: 84 80 73 77  Resp: 18 18 18    Temp: 98 F (36.7 C) (!) 97.5 F (36.4 C) 98 F (36.7 C)   TempSrc: Oral Oral Oral   SpO2: 98% 92% 95%   Weight:  210 lb (95.3 kg)    Height:        Intake/Output Summary (Last 24 hours) at 11/16/2017 1054 Last data filed at 11/16/2017 0600 Gross per 24 hour  Intake 480 ml  Output 200 ml  Net 280 ml   Filed Weights   11/14/17 1223 11/15/17 0048 11/16/17 0535  Weight: 200 lb (90.7 kg) 208 lb 12.8 oz (94.7 kg) 210 lb (95.3 kg)    Telemetry    NSR - Personally Reviewed  ECG    11/15/17- NSR, poor anterior RW - Personally Reviewed  Physical Exam   GEN: No acute distress.   Neck: No JVD Cardiac: RRR, no murmurs, rubs, or gallops.  Respiratory: Clear to auscultation bilaterally. GI: Soft, nontender, non-distended  MS: No edema; No deformity. Neuro:  Nonfocal  Psych: Normal affect   Labs    Chemistry Recent  Labs  Lab 11/14/17 1303 11/14/17 2217 11/15/17 0417 11/16/17 0522  NA 137  --  138 138  K 4.3  --  4.0 4.3  CL 103  --  95* 98*  CO2 22  --  27 28  GLUCOSE 112*  --  128* 132*  BUN 12  --  11 27*  CREATININE 0.83 0.92 1.03* 1.59*  CALCIUM 9.5  --  9.4 9.0  GFRNONAA >60 59* 52* 31*  GFRAA >60 >60 >60 36*  ANIONGAP 12  --  16* 12     Hematology Recent Labs  Lab 11/14/17 1303 11/14/17 2217 11/15/17 0417  WBC 6.0 5.8 7.4  RBC 4.94 5.14* 4.94  HGB 14.0 14.7 13.8  HCT 42.1 43.5 42.4  MCV 85.2 84.6 85.8  MCH 28.3 28.6 27.9  MCHC 33.3 33.8 32.5  RDW 13.4 13.2 13.3  PLT 117* 121* 133*    Cardiac Enzymes Recent Labs  Lab 11/14/17 2217 11/15/17 0417 11/15/17 0748  TROPONINI <0.03 <0.03 <0.03    Recent Labs  Lab 11/14/17 1318 11/14/17 1845  TROPIPOC 0.00 0.00  BNP Recent Labs  Lab 11/14/17 1830  BNP 469.8*     DDimer No results for input(s): DDIMER in the last 168 hours.   Radiology    Dg Chest 2 View  Result Date: 11/14/2017 CLINICAL DATA:  Shortness of breath, chest pain. EXAM: CHEST - 2 VIEW COMPARISON:  Radiograph of April 27, 2015. FINDINGS: The heart size and mediastinal contours are within normal limits. Atherosclerosis of thoracic aorta is noted. Status post coronary artery bypass graft. No pneumothorax is noted. Probable small left pleural effusion is noted. Both lungs are clear. Degenerative changes seen involving the right shoulder. IMPRESSION: Probable small left pleural effusion. No other significant abnormality seen in the chest. Aortic Atherosclerosis (ICD10-I70.0). Electronically Signed   By: Marijo Conception, M.D.   On: 11/14/2017 13:10   Ct Angio Chest Pe W/cm &/or Wo Cm  Result Date: 11/14/2017 CLINICAL DATA:  Chest pain and foot swelling for 2 days. EXAM: CT ANGIOGRAPHY CHEST WITH CONTRAST TECHNIQUE: Multidetector CT imaging of the chest was performed using the standard protocol during bolus administration of intravenous contrast.  Multiplanar CT image reconstructions and MIPs were obtained to evaluate the vascular anatomy. CONTRAST:  100 ml ISOVUE-370 IOPAMIDOL (ISOVUE-370) INJECTION 76% COMPARISON:  PA and lateral chest this same day. CT chest 04/26/2015. FINDINGS: Cardiovascular: No pulmonary embolus is identified. The patient is status post CABG. There is cardiomegaly. Extensive calcific aortic and coronary atherosclerosis are noted. No aortic aneurysm. Mediastinum/Nodes: No enlarged mediastinal, hilar, or axillary lymph nodes. Thyroid gland, trachea, and esophagus demonstrate no significant findings. Lungs/Pleura: Small bilateral pleural effusions are seen. The lungs demonstrate mild dependent atelectasis but are otherwise unremarkable. Upper Abdomen: Negative. Musculoskeletal: The patient has severe multilevel degenerative disc disease. Autologous fusion is seen across the disc interspaces from T9-T12. No acute bony abnormality. Review of the MIP images confirms the above findings. IMPRESSION: Negative for pulmonary embolus. Small bilateral pleural effusions. Cardiomegaly. Status post CABG.  Extensive coronary atherosclerosis noted. Severe multilevel thoracic spondylosis. Aortic Atherosclerosis (ICD10-I70.0). Electronically Signed   By: Inge Rise M.D.   On: 11/14/2017 20:06    Cardiac Studies   Myoview done this am- results pending  Patient Profile     77 y.o. female with a history of CAD and PVD, admitted 11/14/17 with chest pain and dyspnea.   Assessment & Plan    1.  Chest pain, known CAD s/p CABG '99. Low risk Myoview 2011 - troponin x 5 negative - EKG without acute changes - echo with normal LVEF of 60-65% and grade 1 DD, no RWMA - On ASA and plavix, crestor, BB - Myoview pending - episode may have been from accelerated HTN and CHF- treated with IV Lasix x 1.   2. HTN urgency - pressures on arrival in the 180-190s - Norvasc, ARB, lopressor - pressures improved to the 110s  3. HLD - Continue crestor.  LDL goal less than 70.  4. Sublcavian steal syndrome  - s/p bypass 12/2011-angiogram 2014  5. 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 Chronic diastolic heart failure - Primary started 40 mg IV lasix BID. No significant diuresis by I/O-wgt went up 10 lbs ??- suspect wgts inaccurate - she appears euvolemic on exam   7. 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    Plan: Awaiting Myoview results For questions  or updates, please contact [place] Please consult [email or web address] for contact info under Cardiology/STEMI.      Angelena Form, PA-C  11/16/2017, 10:54 AM    Attending Note:   The patient was seen and examined.  Agree with assessment and plan as noted above.  Changes made to the above note as needed.  Patient seen and independently examined with Kerin Ransom, PA .   We discussed all aspects of the encounter. I agree with the assessment and plan as stated above.  1.  Chest pain :   Troponins are negative.   Had myoview today  If it is a high risk study, we may need to cath Otherwise, we may elect for medical therapy. She has severe PVC and arterial access is a challenge   Will also touch base with Dr. Trula Slade     I have spent a total of 40 minutes with patient reviewing hospital  notes , telemetry, EKGs, labs and examining patient as well as establishing an assessment and plan that was discussed with the patient. > 50% of time was spent in direct patient care.    Thayer Headings, Brooke Bonito., MD, [facility] 11/16/2017, 11:24 AM [address],  [place] Pager [phone]

## 2017-11-16 NOTE — Progress Notes (Addendum)
Received request for code 39; patient is currently off the floor having stress test; CM will initiate the process when patient returns to the floor Aneta Mins [phone]   Incomplete            Case Management Note  Patient Details  Name: Deanna [name] MRN: [id number] Date of Birth: 1941-06-01  Subjective/Objective:   CHF              Action/Plan: Lives at home with daughter;  YSH:UOHF, Dola Factor, MD; has private insurance with [facility] Medicare with prescription drug coverage; CM following for progression of care.   Expected Discharge Date:    possibly 11/16/2017               Expected Discharge Plan:   home/ self  Status of Service:   In progress  Sherrilyn Rist [phone] 11/16/2017, 11:35 AM

## 2017-11-16 NOTE — Care Management CC44 (Signed)
Condition Code 44 Documentation Completed  Patient Details  Name: Deanna [name] MRN: [id number] Date of Birth: Sep 24, 1940   Condition Code 44 given:  Yes Patient signature on Condition Code 44 notice:  Yes Documentation of 2 MD's agreement:  Yes Code 44 added to claim:  Yes    Royston Bake, RN 11/16/2017, 11:28 AM

## 2017-11-16 NOTE — Discharge Summary (Signed)
Physician Discharge Summary  Deanna [name] VFI:[id number] DOB: 1941/04/28 DOA: 11/14/2017  PCP: Bartholome Bill, MD  Admit date: 11/14/2017 Discharge date: 11/16/2017  Admitted From: Home Discharge disposition: Home   Recommendations for Outpatient Follow-Up:   1. Recommend follow-up with [facility] in a few weeks. Patient referred therapy per her request for a local PCP. She has outpatient cardiology follow-up scheduled.   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  Diet recommendation: Low sodium, heart healthy.  Code status: Full.   History of Present Illness:   JAEMARIE [name] is an 77 y.o. 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 Upon initial evaluation in the ED, BNP was 469 and CT angiogram of the chest showed small bilateral pleural effusions and cardiomegaly. EKG was nonacute. Blood pressure was elevated.  Hospital Course by Problem:   Principal Problem:   Acute CHF (congestive heart failure) (HCC)/acute respiratory failure with hypoxia The patient has not had a recent echocardiogram, and 2-D echo from 04/26/15 showed normal EF of 60-65 percent, akinesis of the basal  inferior myocardium, and grade 1 diastolic dysfunction. She was given 20 mg of Lasix in the ED and started on Lasix 40 mg every 12 hours by the admitting physician.  Repeat 2-D echo showed EF 65-70 percent, grade 1 diastolic dysfunction and no regional wall motion abnormalities. Troponins negative 3. Discharged home on Lasix to take as needed for weight gain or symptoms. Stress test done 11/16/17 and was low risk. Cleared for discharge home with outpatient cardiology follow-up.  Active Problems:   Acute kidney injury Likely from diuretics. Will discontinue Lasix.    Thrombocytopenia Mild, chronic.    GERD On both PPI and H2 blocker therapy.    Chronic pain Continue tramadol. Meloxicam discontinued.    History of depression Continue Zoloft. Depakote currently on hold.    Hyperlipidemia Continue statin.    H/O CAD s/p CABG Continue aspirin and Plavix, Crestor, beta blocker. Continue to monitor on telemetry.    Hypothyroidism Continue synthroid. TSH WNL.    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    OSA on CPAP Continue CPAP daily at bedtime.    Hypertensive urgency/essential hypertension Continue Norvasc, Avapro, metoprolol and when necessary hydralazine. Blood pressure controlled.    Morbid obesity with comorbidities Body mass index is 41.01 kg/m. Diet education provided to the patient by dietitian 11/15/17. Low-sodium diet stressed.  Medical Consultants:    Cardiology   Discharge Exam:   Vitals:   11/16/17 1131 11/16/17 1300  BP: 121/65 121/65  Pulse: 85 85  Resp:  18  Temp:  98 F (36.7 C)  SpO2: 91% 91%   Vitals:  11/16/17 0802 11/16/17 0941 11/16/17 1131 11/16/17 1300  BP: (!) 130/53 (!) 154/89 121/65 121/65  Pulse: 73 77 85 85  Resp: 18   18  Temp: 98 F (36.7 C)   98 F (36.7 C)  TempSrc: Oral   Oral  SpO2: 95%  91% 91%  Weight:      Height:        General exam: Appears calm and comfortable.   Respiratory system: Clear to auscultation. Respiratory effort normal. Cardiovascular system: S1 & S2 heard, RRR. No JVD,  rubs, gallops or clicks. No murmurs. Gastrointestinal system: Abdomen is nondistended, soft and nontender. No organomegaly or masses felt. Normal bowel sounds heard. Central nervous system: Alert and oriented. No focal neurological deficits. Extremities: No clubbing,  or cyanosis. Trace edema. Skin: No rashes, lesions or ulcers. Psychiatry: Judgement and insight appear normal. Mood & affect appropriate.    The results of significant diagnostics from this hospitalization (including imaging, microbiology, ancillary and laboratory) are listed below for reference.     Procedures and Diagnostic Studies:   Dg Chest 2 View  Result Date: 11/14/2017 CLINICAL DATA:  Shortness of breath, chest pain. EXAM: CHEST - 2 VIEW COMPARISON:  Radiograph of April 27, 2015. FINDINGS: The heart size and mediastinal contours are within normal limits. Atherosclerosis of thoracic aorta is noted. Status post coronary artery bypass graft. No pneumothorax is noted. Probable small left pleural effusion is noted. Both lungs are clear. Degenerative changes seen involving the right shoulder. IMPRESSION: Probable small left pleural effusion. No other significant abnormality seen in the chest. Aortic Atherosclerosis (ICD10-I70.0). Electronically Signed   By: Marijo Conception, M.D.   On: 11/14/2017 13:10   Ct Angio Chest Pe W/cm &/or Wo Cm  Result Date: 11/14/2017 CLINICAL DATA:  Chest pain and foot swelling for 2 days. EXAM: CT ANGIOGRAPHY CHEST WITH CONTRAST TECHNIQUE: Multidetector CT imaging of the chest was performed using the standard protocol during bolus administration of intravenous contrast. Multiplanar CT image reconstructions and MIPs were obtained to evaluate the vascular anatomy. CONTRAST:  100 ml ISOVUE-370 IOPAMIDOL (ISOVUE-370) INJECTION 76% COMPARISON:  PA and lateral chest this same day. CT  chest 04/26/2015. FINDINGS: Cardiovascular: No pulmonary embolus is identified. The patient is status post CABG. There is cardiomegaly. Extensive calcific aortic and coronary atherosclerosis are noted. No aortic aneurysm. Mediastinum/Nodes: No enlarged mediastinal, hilar, or axillary lymph nodes. Thyroid gland, trachea, and esophagus demonstrate no significant findings. Lungs/Pleura: Small bilateral pleural effusions are seen. The lungs demonstrate mild dependent atelectasis but are otherwise unremarkable. Upper Abdomen: Negative. Musculoskeletal: The patient has severe multilevel degenerative disc disease. Autologous fusion is seen across the disc interspaces from T9-T12. No acute bony abnormality. Review of the MIP images confirms the above findings. IMPRESSION: Negative for pulmonary embolus. Small bilateral pleural effusions. Cardiomegaly. Status post CABG.  Extensive coronary atherosclerosis noted. Severe multilevel thoracic spondylosis. Aortic Atherosclerosis (ICD10-I70.0). Electronically Signed   By: Inge Rise M.D.   On: 11/14/2017 20:06     Labs:   Basic Metabolic Panel: Recent Labs  Lab 11/14/17 1303 11/14/17 2217 11/15/17 0417 11/16/17 0522  NA 137  --  138 138  K 4.3  --  4.0 4.3  CL 103  --  95* 98*  CO2 22  --  27 28  GLUCOSE 112*  --  128* 132*  BUN 12  --  11 27*  CREATININE 0.83 0.92 1.03* 1.59*  CALCIUM 9.5  --  9.4 9.0  MG  --  2.0  --   --  GFR Estimated Creatinine Clearance: 31.6 mL/min (A) (by C-G formula based on SCr of 1.59 mg/dL (H)).  CBC: Recent Labs  Lab 11/14/17 1303 11/14/17 2217 11/15/17 0417  WBC 6.0 5.8 7.4  HGB 14.0 14.7 13.8  HCT 42.1 43.5 42.4  MCV 85.2 84.6 85.8  PLT 117* 121* 133*   Cardiac Enzymes: Recent Labs  Lab 11/14/17 2217 11/15/17 0417 11/15/17 0748  TROPONINI <0.03 <0.03 <0.03   Thyroid function studies Recent Labs    11/14/17 2217  TSH 4.355    Discharge Instructions:   Discharge Instructions    (HEART  FAILURE PATIENTS) Call MD:  Anytime you have any of the following symptoms: 1) 3 pound weight gain in 24 hours or 5 pounds in 1 week 2) shortness of breath, with or without a dry hacking cough 3) swelling in the hands, feet or stomach 4) if you have to sleep on extra pillows at night in order to breathe.   Complete by:  As directed    Call MD for:  difficulty breathing, headache or visual disturbances   Complete by:  As directed    Diet - low sodium heart healthy   Complete by:  As directed    Increase activity slowly   Complete by:  As directed      Allergies as of 11/16/2017      Reactions   Donepezil Other (See Comments)   Altered mood, aggression, and caused anger   Hydrocodone-acetaminophen Other (See Comments)   "Delirium, confusion, toxic dementia"   Memantine Other (See Comments)   Caused severe aggression   Oxycodone Other (See Comments)   Toxic dementia   Vicodin [hydrocodone-acetaminophen] Other (See Comments)   Delirium, confusion, and toxic dementia      Medication List    STOP taking these medications   divalproex 250 MG 24 hr tablet Commonly known as:  DEPAKOTE ER   meloxicam 15 MG tablet Commonly known as:  MOBIC     TAKE these medications   acetaminophen 325 MG tablet Commonly known as:  TYLENOL Take 325 mg by mouth every 6 (six) hours as needed (for pain).   amLODipine 5 MG tablet Commonly known as:  NORVASC Take 5 mg by mouth daily.   aspirin EC 81 MG tablet Take 81 mg by mouth at bedtime.   clopidogrel 75 MG tablet Commonly known as:  PLAVIX Take 1 tablet (75 mg total) by mouth once. What changed:  when to take this   CRESTOR 10 MG tablet Generic drug:  rosuvastatin TAKE ONE TABLET BY MOUTH DAILY What changed:    how much to take  how to take this  when to take this   furosemide 40 MG tablet Commonly known as:  LASIX Take 1 tablet (40 mg total) by mouth daily as needed. Take 1 tablet if you have more than a 2 pound weight gain in 24  hours as needed.   levothyroxine 200 MCG tablet Commonly known as:  SYNTHROID, LEVOTHROID Take 200 mcg by mouth daily before breakfast.   LORazepam 1 MG tablet Commonly known as:  ATIVAN Take 0.5 tablets (0.5 mg total) by mouth as needed for anxiety. For aniexty What changed:    how much to take  when to take this  additional instructions   metoprolol tartrate 50 MG tablet Commonly known as:  LOPRESSOR Take 25 mg by mouth 2 (two) times daily. What changed:  Another medication with the same name was removed. Continue taking this medication, and follow the  directions you see here.   mirabegron ER 25 MG Tb24 tablet Commonly known as:  MYRBETRIQ Take 25 mg by mouth daily.   omeprazole 40 MG capsule Commonly known as:  PRILOSEC Take 40 mg by mouth daily.   ranitidine 150 MG capsule Commonly known as:  ZANTAC Take 150 mg by mouth daily as needed for heartburn.   sertraline 100 MG tablet Commonly known as:  ZOLOFT Take 100 mg by mouth at bedtime.   tiZANidine 2 MG tablet Commonly known as:  ZANAFLEX Take 2 mg by mouth every 6 (six) hours as needed for muscle spasms.   traMADol 50 MG tablet Commonly known as:  ULTRAM Take 50 mg by mouth every 6 (six) hours as needed (for pain).   valsartan 80 MG tablet Commonly known as:  DIOVAN Take 80 mg by mouth every morning.   vitamin B-1 250 MG tablet Take 250 mg by mouth daily.   Vitamin D-3 1000 units Caps Take 2,000 Units by mouth at bedtime.   XALATAN 0.005 % ophthalmic solution Generic drug:  latanoprost Place 1 drop into both eyes at bedtime.      Follow-up Information    Lorretta Harp, MD Follow up.   Specialties:  Cardiology, Radiology Why:  Office will call you Contact information: [address] [place] [address] [phone]        Lauree Chandler, NP. Go on 11/29/2017.   Specialty:  Geriatric Medicine Why:  @2 :15pm Contact information: [address] [place]  [address] [phone]            Time coordinating discharge: 30 minutes.  SignedMargreta Journey Rosselyn Martha  Pager [phone] [facility] 11/16/2017, 3:24 PM

## 2017-11-16 NOTE — Discharge Instructions (Signed)
Shortness of Breath, Adult  Shortness of breath means you have trouble breathing. Your lungs are organs for breathing.  Follow these instructions at home:  Pay attention to any changes in your symptoms. Take these actions to help with your condition:  ? Do not smoke. Smoking can cause shortness of breath. If you need help to quit smoking, ask your doctor.  ? Avoid things that can make it harder to breathe, such as:  ? Mold.  ? Dust.  ? Air pollution.  ? Chemical smells.  ? Things that can cause allergy symptoms (allergens), if you have allergies.  ? Keep your living space clean and free of mold and dust.  ? Rest as needed. Slowly return to your usual activities.  ? Take over-the-counter and prescription medicines, including oxygen and inhaled medicines, only as told by your doctor.  ? Keep all follow-up visits as told by your doctor. This is important.  Contact a doctor if:  ? Your condition does not get better as soon as expected.  ? You have a hard time doing your normal activities, even after you rest.  ? You have new symptoms.  Get help right away if:  ? You have trouble breathing when you are resting.  ? You feel light-headed or you faint.  ? You have a cough that is not helped by medicines.  ? You cough up blood.  ? You have pain with breathing.  ? You have pain in your chest, arms, shoulders, or belly (abdomen).  ? You have a fever.  ? You cannot walk up stairs.  ? You cannot exercise the way you normally do.  This information is not intended to replace advice given to you by your health care provider. Make sure you discuss any questions you have with your health care provider.  Document Released: 01/11/2008 Document Revised: 08/11/2016 Document Reviewed: 08/11/2016  Elsevier Interactive Patient Education ? 2017 [employer]

## 2017-11-16 NOTE — Progress Notes (Addendum)
Myoview low risk. I suspect she can be discharged with plans for OP f/u. MD to review.  Kerin Ransom PA-C 11/16/2017 12:56 PM   Pt will be stable for DC .    Mertie Moores, MD  11/16/2017 1:34 PM    [facility] [place],  [place], [place]  [address] Pager [phone] Phone: [phone]; Fax: [phone]

## 2017-11-16 NOTE — Care Management Obs Status (Signed)
[facility] NOTIFICATION   Patient Details  Name: Deanna [name] MRN: [id number] Date of Birth: 04/04/1941   Medicare Observation Status Notification Given:  Yes    Royston Bake, RN 11/16/2017, 11:27 AM

## 2017-11-22 ENCOUNTER — Telehealth: Payer: Self-pay

## 2017-11-22 NOTE — Telephone Encounter (Signed)
I left a message for patient's daughter asking that she call the office to discuss if she received the new patient packet.

## 2017-11-23 NOTE — Telephone Encounter (Signed)
I spoke with patient's daughter, [name], who stated that she would bring new patient packet on Monday 11/27/17.

## 2017-11-27 ENCOUNTER — Ambulatory Visit (INDEPENDENT_AMBULATORY_CARE_PROVIDER_SITE_OTHER)
Admission: RE | Admit: 2017-11-27 | Discharge: 2017-11-27 | Disposition: A | Discharge status: Home / Self Care | Payer: Medicare HMO | Source: Ambulatory Visit | Attending: Family | Admitting: Family

## 2017-11-27 ENCOUNTER — Encounter: Payer: Self-pay | Admitting: Family

## 2017-11-27 ENCOUNTER — Ambulatory Visit: Payer: Medicare HMO | Admitting: Family

## 2017-11-27 ENCOUNTER — Ambulatory Visit (HOSPITAL_COMMUNITY)
Admission: RE | Admit: 2017-11-27 | Discharge: 2017-11-27 | Disposition: A | Discharge status: Home / Self Care | Payer: Medicare HMO | Source: Ambulatory Visit | Attending: Family | Admitting: Family

## 2017-11-27 ENCOUNTER — Other Ambulatory Visit: Payer: Self-pay

## 2017-11-27 VITALS — BP 100/60 | HR 70 | Temp 98.6°F | Resp 16 | Ht 60.0 in | Wt 214.0 lb

## 2017-11-27 DIAGNOSIS — Z95828 Presence of other vascular implants and grafts: Secondary | ICD-10-CM | POA: Diagnosis not present

## 2017-11-27 DIAGNOSIS — Z87891 Personal history of nicotine dependence: Secondary | ICD-10-CM | POA: Diagnosis not present

## 2017-11-27 DIAGNOSIS — I739 Peripheral vascular disease, unspecified: Secondary | ICD-10-CM

## 2017-11-27 DIAGNOSIS — I779 Disorder of arteries and arterioles, unspecified: Secondary | ICD-10-CM

## 2017-11-27 DIAGNOSIS — R9439 Abnormal result of other cardiovascular function study: Secondary | ICD-10-CM | POA: Insufficient documentation

## 2017-11-27 DIAGNOSIS — I998 Other disorder of circulatory system: Secondary | ICD-10-CM

## 2017-11-27 DIAGNOSIS — I70229 Atherosclerosis of native arteries of extremities with rest pain, unspecified extremity: Secondary | ICD-10-CM

## 2017-11-27 DIAGNOSIS — I729 Aneurysm of unspecified site: Secondary | ICD-10-CM | POA: Diagnosis not present

## 2017-11-27 NOTE — Patient Instructions (Signed)

## 2017-11-27 NOTE — Progress Notes (Signed)
[facility] [place]   CC: Follow up peripheral artery occlusive disease  History of Present Illness Deanna [name] is a 77 y.o. female who has a history of undergoing left carotid to subclavian artery bypass graft on 12/15/2011 by Dr. Trula Slade. This was done in the setting of dizziness and left arm numbness. Her postoperative course was complicated by a seroma which became infected and required drainage. Ultimately she recovered.  She denies numbness, tingling, pain, weakness, or cold sensation in either upper extremity.   She has a history of a femoral-femoral bypass graft by Dr. Arlyce Dice.  She was found to have a mid graft pseudoaneurysm in 2017. She was having difficulty with pain in her right hip as well as her back and was sent for vascular evaluation.  She is also s/p ultrasound-guided access via left femoral artery, right lower extremity runoff, drug-coated balloon angioplasty of right limb of the femoral femoral bypass graft, and endovascular aneurysm repair of a pseudoaneurysm off of the femoral femoral bypass graft on 08-22-17. By Dr. 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  She retruns today for 3 months follow up.   She reports great improvement in her legs since the January 2019 vascular surgery. She also states she has spurs in her lumbar spine, this is followed by Dr. Hal Morales, and that she still has pain in her right hip.   Daughter states pt has a hx of a TBI.     Diabetic: No Tobacco use: former smoker, quit in 1983   Pt meds include: Statin :Yes Betablocker: Yes ASA: Yes Other anticoagulants/antiplatelets: Plavix  Past Medical History:  Diagnosis Date  . AMI 11/17/2008   Qualifier: Diagnosis of  By: Marland Mcalpine    . Anxiety   . Atherosclerotic peripheral vascular disease ([place])   . Bacteremia, escherichia coli 04/27/2015  . CAD (coronary artery disease)    followed by dr [name].  . Celiac artery  stenosis ([place])   . CHF (congestive heart failure) ([place]) 11/14/2017  . Colon polyps   . Community acquired pneumonia   . Depression   . Diverticulosis   . Dysuria   . GERD (gastroesophageal reflux disease)   . Hemorrhoids   . Hypertension   . Hypothyroidism   . Myocardial infarction ([place])   . Peripheral arterial disease ([place])   . Pneumonitis 04/26/2015  . Pyelonephritis   . Pyelonephritis due to Escherichia coli 04/28/2015  . Sepsis ([place])   . Sinus drainage    took z-pack   finished yesterday  . Urticaria   . Vertigo, benign positional     Social History Social History   Tobacco Use  . Smoking status: Former Smoker    Types: Cigarettes    Last attempt to quit: 11/27/1981    Years since quitting: 36.0  . Smokeless tobacco: Never Used  Substance Use Topics  . Alcohol use: Yes    Alcohol/week: 0.0 oz    Comment: Rare;only twice yearly  . Drug use: No    Family History Family History  Problem Relation Age of Onset  . Heart attack Mother   . Heart disease Mother        before age 63  . Diabetes Father   . Heart disease Father        before age 72  . Hypertension Father   . Hyperlipidemia Father   . Heart attack Father   . Colon cancer Brother   . Heart attack Brother   .  Hyperlipidemia Brother   . Hypertension Brother   . Heart disease Brother        before age 56  . Diabetes Sister   . Heart disease Sister        before age 91  . Hyperlipidemia Sister   . Hypertension Sister   . Heart attack Sister     Past Surgical History:  Procedure Laterality Date  . ABDOMINAL AORTOGRAM W/LOWER EXTREMITY N/A 08/22/2017   Procedure: ABDOMINAL AORTOGRAM W/LOWER EXTREMITY;  Surgeon: Serafina Mitchell, MD;  Location: [facility];  Service: Cardiovascular;  Laterality: N/A;  . BILATERAL UPPER EXTREMITY ANGIOGRAM N/A 09/11/2012   Procedure: BILATERAL UPPER EXTREMITY ANGIOGRAM;  Surgeon: Serafina Mitchell, MD;  Location: [facility] C[facility] LAB;  Service: Cardiovascular;  Laterality:  N/A;  . carotid duplex doppler Bilateral 09/03/2012, 11/03/2011   Evidence of 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 patent left common carotid- subclavian artery bypass with turbulent flow noted at the anastomosis with velocities of 295 cm/s  . CAROTID-SUBCLAVIAN BYPASS GRAFT  12/15/2011   Procedure: BYPASS GRAFT CAROTID-SUBCLAVIAN;  Surgeon: Serafina Mitchell, MD;  Location: [facility];  Service: Vascular;  Laterality: Left;  Left Carotid subclavian bypass  . CORONARY ANGIOPLASTY    . CORONARY ARTERY BYPASS GRAFT    . DOPPLER ECHOCARDIOGRAPHY  05/27/2010, 09/17/2008   Mild Proximal septal thickening is noted. Left ventricular systolic functions is normal ejection fraction =>55%. the aortic valve appears to be mildly sclerotic   . fem-fem bypass graft    . holter monitor  01/21/2008   The predominant rhythm was normal sinus rhythm. Minimum heartrate of 63 bpm at +01:00, maximum heartrate of 105 bpm at + 10:35; and the average heartrate of 75 bpm. Ventricular ectopic activity totaled 1270: Multifocal; 866-PVC's and 404-VEs             . JOINT REPLACEMENT     Left knee  . LEFT HEART CATHETERIZATION WITH CORONARY/GRAFT ANGIOGRAM N/A 12/21/2011   Procedure: LEFT HEART CATHETERIZATION WITH Beatrix Fetters;  Surgeon: Leonie Man, MD;  Location: [facility] CATH LAB;  Service: Cardiovascular;  Laterality: N/A;  . NM MYOCAR PERF EJECTION FRACTION  09/22/2009, 07/03/2007   the post stress myocardial perfusion images show a normal pattern of perfusion is all regions. The post-stress ejection fraction is 68 %. no significant wall motion abnormalities noted. This is a low risk scan.  Marland Kitchen PERIPHERAL VASCULAR INTERVENTION  08/22/2017   Procedure: PERIPHERAL VASCULAR INTERVENTION;  Surgeon: Serafina Mitchell, MD;  Location: [facility];  Service: Cardiovascular;;  Fem/Fem  Graft  . REPLACEMENT TOTAL KNEE  05-2011  . UNILATERAL UPPER EXTREMEITY ANGIOGRAM Left 11/15/2011   Procedure: UNILATERAL UPPER EXTREMEITY ANGIOGRAM;  Surgeon: Lorretta Harp, MD;  Location: [facility] CATH LAB;  Service: Cardiovascular;  Laterality: Left;    Allergies  Allergen Reactions  . Donepezil Other (See Comments)    Altered mood, aggression, and caused anger  . Hydrocodone-Acetaminophen Other (See Comments)    "Delirium, confusion, toxic dementia"  . Meloxicam     Pt suspects that this medicine causes fluid on her lungs.   . Memantine Other (See Comments)    Caused severe aggression  . Oxycodone Other (See Comments)    Toxic dementia  . Vicodin [Hydrocodone-Acetaminophen] Other (See Comments)    Delirium, confusion, and toxic dementia    Current Outpatient Medications  Medication Sig Dispense Refill  .  acetaminophen (TYLENOL) 325 MG tablet Take 325 mg by mouth every 6 (six) hours as needed (for pain).    Marland Kitchen amLODipine (NORVASC) 5 MG tablet Take 5 mg by mouth daily.    Marland Kitchen aspirin EC 81 MG tablet Take 81 mg by mouth at bedtime.     . Cholecalciferol (VITAMIN D-3) 1000 units CAPS Take 2,000 Units by mouth at bedtime.    . clopidogrel (PLAVIX) 75 MG tablet Take 1 tablet (75 mg total) by mouth once. (Patient taking differently: Take 75 mg by mouth every evening. ) 30 tablet 4  . CRESTOR 10 MG tablet TAKE ONE TABLET BY MOUTH DAILY (Patient taking differently: Take 10 mg by mouth at bedtime) 30 tablet 6  . furosemide (LASIX) 40 MG tablet Take 1 tablet (40 mg total) by mouth daily as needed. Take 1 tablet if you have more than a 2 pound weight gain in 24 hours as needed. 30 tablet 11  . latanoprost (XALATAN) 0.005 % ophthalmic solution Place 1 drop into both eyes at bedtime.    Marland Kitchen levothyroxine (SYNTHROID, LEVOTHROID) 200 MCG tablet Take 200 mcg by mouth daily before breakfast.     . LORazepam (ATIVAN) 1 MG tablet Take 0.5 tablets (0.5 mg total) by mouth as needed for anxiety. For aniexty  (Patient taking differently: Take 1 mg by mouth daily as needed for anxiety. ) 30 tablet 0  . metoprolol tartrate (LOPRESSOR) 50 MG tablet Take 25 mg by mouth 2 (two) times daily.     . mirabegron ER (MYRBETRIQ) 25 MG TB24 tablet Take 25 mg by mouth daily.    Marland Kitchen omeprazole (PRILOSEC) 40 MG capsule Take 40 mg by mouth daily.    . Potassium 95 MG TABS Take by mouth.    . ranitidine (ZANTAC) 150 MG capsule Take 150 mg by mouth daily as needed for heartburn.     . sertraline (ZOLOFT) 100 MG tablet Take 100 mg by mouth at bedtime.     . Thiamine HCl (VITAMIN B-1) 250 MG tablet Take 250 mg by mouth daily.    Marland Kitchen tiZANidine (ZANAFLEX) 2 MG tablet Take 2 mg by mouth every 6 (six) hours as needed for muscle spasms.    . traMADol (ULTRAM) 50 MG tablet Take 50 mg by mouth every 6 (six) hours as needed (for pain).     . valsartan (DIOVAN) 80 MG tablet Take 80 mg by mouth every morning.      No current facility-administered medications for this visit.     ROS: See HPI for pertinent positives and negatives.   Physical Examination  Vitals:   11/27/17 1539 11/27/17 1545  BP: (!) 156/65 100/60  Pulse: 70   Resp: 16   Temp: 98.6 F (37 C)   TempSrc: Oral   SpO2: 92%   Weight: 214 lb (97.1 kg)   Height: 5' (1.524 m)    Body mass index is 41.79 kg/m.  General: A&O x 3, WDWN, morbidly obese female. Gait: normal HENT: No gross abnormalities.  Eyes: PERRLA. Pulmonary: Respirations are non labored, CTAB, distant breath sounds Cardiac: regular rhythm, no detected murmur.         Carotid Bruits Right Left   Negative Negative   Radial pulses are 2+ palpable right, not palpable left, left brachial is faintly palpable  Adominal aortic pulse is not palpable      Fem-fem bypass graft pulse is not palpable, but pt is morbidly obese  VASCULAR EXAM: Extremities without ischemic changes, without Gangrene; without open wounds.                                                                                                           LE Pulses Right Left       FEMORAL  2+ palpable  2+ palpable        POPLITEAL  not palpable   not palpable       POSTERIOR TIBIAL  not palpable   not palpable        DORSALIS PEDIS      ANTERIOR TIBIAL not palpable  1+ palpable    Abdomen: soft, NT, no palpable masses, large panus. Skin: no rashes, no cellulitis, no ulcers noted. Musculoskeletal: no muscle wasting or atrophy.  Neurologic: A&O X 3; appropriate affect, Sensation is normal; MOTOR FUNCTION:  moving all extremities equally, motor strength 5/5 throughout. Speech is fluent/normal. CN 2-12 intact except is hard of hearing. Psychiatric: Thought content is not cohesive, mood appropriate for clinical situation.     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 By Dr. 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 She has a history of a femoral-femoral bypass graft by Dr. Arlyce Dice years ago.  She is also s/p left carotid to subclavian artery bypass graft on 12/15/2011 by Dr. Trula Slade. This was done in the setting of dizziness and left arm numbness.  She no longer has dizziness, no longer has numbness in her left arm, but her left radial pulse is not palpable, and left brachial pulse is faintly palpable. No signs of ischemia in her left hand.  Left brachial pressure is 56 mm Hg less than right. Dampened left brachial artery waveforms on ABI's.   I spoke with Dr. Donnetta Hutching re fem-fem duplex velocity of 332 at the distal anastomosis compared to 426 cm/s on 08-14-17, before the 08-22-17 angioplasty, and that pt's leg sx's have improved, but she does not walk much out of habit per daughter.  Pt  is seeing Dr. Hal Morales for right hip and low back pain.  There are no signs of ischemia in her feet or legs, and despite her morbid obesity and large panus, bilateral femoral pulses are 2+ palpable. But I am unable to palpate a pulse in the fem-fem bypass graft.   Significant decline in right ABI, but waveforms are biphasic.   Dr. Donnetta Hutching advised 3 months follow up with same studies as today, see Dr. Trula Slade afterward.     DATA  Fem-Fem Bypass Graft Duplex (11/27/17): Inflow left: 301 cm/s; Distal anastomosis: 332 cm/s, this may be partially due to slight tortuosity.  Right anastomosis measures measures 0.98 cm x 0.88 cm. Left anastomosis measures 1.02 cm x 0.98 cm.   ABI (Date: 11/27/2017):  R:   ABI:  0.56 (was 0.78 on 08-14-17),   PT: bi  DP: bi  TBI:  0.36 (was 0.37)  L:   ABI: 0.97 (was 1.07),   PT: bi  DP: bi  TBI: 0.63 (was 0.53)  Decline in right ABI to moderate disease, with stable TBI, biphasic waveforms.  Stable left ABI, normal range, and TBI, biphasic waveforms.    Carotid Duplex (10-03-17): Right Carotid: Velocities in the right ICA are consistent with a 60-79%        stenosis. Hemodynamically significant plaque >50% visualized in        the CCA. The ECA appears >50% stenosed. The RICA velocities are        elevated and stable compared to the prior exam. The RECA        velocities are elevated and have increased compared to the prior        exam.  Left Carotid: Velocities in the left ICA are consistent with a 40-59% stenosis.       Non-hemodynamically significant plaque noted in the CCA. The LICA       velocities are elevated and essentially stable compared to the       prior exam.  Vertebrals: Both vertebral arteries were patent with antegrade flow. Subclavians: Right subclavian artery was stenotic. Left subclavian steal       syndrome; patent proximal CCA to subclavian artery bypass  graft.        There is a 41 mmHG difference between the arms, with the left being       the lowest    PLAN:  Based on the patient's vascular studies and examination, pt will return to clinic in 3 months with fem-fem bypass graft duplex, ABI's, and see Dr. Trula Slade.   I discussed in depth with the patient the nature of atherosclerosis, and emphasized the importance of maximal medical management including strict control of blood pressure, blood glucose, and lipid levels, obtaining regular exercise, and continued cessation of smoking.  The patient is aware that without maximal medical management the underlying atherosclerotic disease process will progress, limiting the benefit of any interventions.  The patient was given information about PAD including signs, symptoms, treatment, what symptoms should prompt the patient to seek immediate medical care, and risk reduction measures to take.  Clemon Chambers, RN, MSN, FNP-C [facility] Phone: [phone]  Clinic MD: Early on call  11/27/17 3:51 PM

## 2017-11-28 ENCOUNTER — Telehealth: Payer: Self-pay

## 2017-11-28 NOTE — Telephone Encounter (Signed)
I left a message for patient's daughter, [name], to call the office to discuss whether or not she understands that this office is a primary care office and not a speccialist.

## 2017-11-28 NOTE — Telephone Encounter (Signed)
I spoke with patient's daughter and she verified that they are seeking a new PCP for patient.

## 2017-11-29 ENCOUNTER — Ambulatory Visit: Payer: Medicare HMO | Admitting: Nurse Practitioner

## 2017-11-29 ENCOUNTER — Ambulatory Visit (INDEPENDENT_AMBULATORY_CARE_PROVIDER_SITE_OTHER): Payer: Medicare HMO | Admitting: Nurse Practitioner

## 2017-11-29 ENCOUNTER — Encounter: Payer: Self-pay | Admitting: Nurse Practitioner

## 2017-11-29 VITALS — BP 142/74 | HR 73 | Temp 98.3°F | Ht 60.0 in | Wt 212.0 lb

## 2017-11-29 DIAGNOSIS — F341 Dysthymic disorder: Secondary | ICD-10-CM

## 2017-11-29 DIAGNOSIS — I10 Essential (primary) hypertension: Secondary | ICD-10-CM

## 2017-11-29 DIAGNOSIS — I2510 Atherosclerotic heart disease of native coronary artery without angina pectoris: Secondary | ICD-10-CM | POA: Diagnosis not present

## 2017-11-29 DIAGNOSIS — Z9989 Dependence on other enabling machines and devices: Secondary | ICD-10-CM

## 2017-11-29 DIAGNOSIS — E039 Hypothyroidism, unspecified: Secondary | ICD-10-CM | POA: Diagnosis not present

## 2017-11-29 DIAGNOSIS — G4733 Obstructive sleep apnea (adult) (pediatric): Secondary | ICD-10-CM

## 2017-11-29 DIAGNOSIS — E785 Hyperlipidemia, unspecified: Secondary | ICD-10-CM

## 2017-11-29 DIAGNOSIS — K219 Gastro-esophageal reflux disease without esophagitis: Secondary | ICD-10-CM

## 2017-11-29 DIAGNOSIS — G8929 Other chronic pain: Secondary | ICD-10-CM

## 2017-11-29 DIAGNOSIS — I509 Heart failure, unspecified: Secondary | ICD-10-CM

## 2017-11-29 DIAGNOSIS — R413 Other amnesia: Secondary | ICD-10-CM

## 2017-11-29 LAB — BASIC METABOLIC PANEL WITH GFR
BUN/Creatinine Ratio: 30 (calc) — ABNORMAL HIGH (ref 6–22)
BUN: 46 mg/dL — ABNORMAL HIGH (ref 7–25)
CALCIUM: 9.7 mg/dL (ref 8.6–10.4)
CO2: 27 mmol/L (ref 20–32)
CREATININE: 1.53 mg/dL — AB (ref 0.60–0.93)
Chloride: 101 mmol/L (ref 98–110)
GFR, EST NON AFRICAN AMERICAN: 33 mL/min/{1.73_m2} — AB (ref >=60)
GFR, Est African American: 38 mL/min/{1.73_m2} — ABNORMAL LOW (ref >=60)
GLUCOSE: 98 mg/dL (ref 65–139)
POTASSIUM: 5.2 mmol/L (ref 3.5–5.3)
SODIUM: 135 mmol/L (ref 135–146)

## 2017-11-29 MED ORDER — METOPROLOL TARTRATE 25 MG PO TABS: 25.0000 mg | ORAL_TABLET | Freq: Two times a day (BID) | ORAL | 1 refills | Status: DC

## 2017-11-29 MED ORDER — LORAZEPAM 1 MG PO TABS: ORAL_TABLET | ORAL | Status: DC

## 2017-11-29 MED ORDER — DEXLANSOPRAZOLE 30 MG PO CPDR: 30.0000 mg | DELAYED_RELEASE_CAPSULE | Freq: Every day | ORAL | 1 refills | Status: DC

## 2017-11-29 NOTE — Patient Instructions (Addendum)
Avoid NSAIDS -(Aleve, Advil, Motrin, Ibuprofen)  Tylenol is okay Keep hydrated with water Make appt with dermatology, will place referral if needed STOP potassium supplement  Start dexilant daily  Follow up in 4-6 weeks with fasting labs prior to visit   [place] stands for "Dietary Approaches to Stop Hypertension." The DASH eating plan is a healthy eating plan that has been shown to reduce high blood pressure (hypertension). It may also reduce your risk for type 2 diabetes, heart disease, and stroke. The DASH eating plan may also help with weight loss. What are tips for following this plan? General guidelines  Avoid eating more than 2,300 mg (milligrams) of salt (sodium) a day. If you have hypertension, you may need to reduce your sodium intake to 1,500 mg a day.  Limit alcohol intake to no more than 1 drink a day for nonpregnant women and 2 drinks a day for men. One drink equals 12 oz of beer, 5 oz of wine, or 1 oz of hard liquor.  Work with your health care provider to maintain a healthy body weight or to lose weight. Ask what an ideal weight is for you.  Get at least 30 minutes of exercise that causes your heart to beat faster (aerobic exercise) most days of the week. Activities may include walking, swimming, or biking.  Work with your health care provider or diet and nutrition specialist (dietitian) to adjust your eating plan to your individual calorie needs. Reading food labels  Check food labels for the amount of sodium per serving. Choose foods with less than 5 percent of the Daily Value of sodium. Generally, foods with less than 300 mg of sodium per serving fit into this eating plan.  To find whole grains, look for the word "whole" as the first word in the ingredient list. Shopping  Buy products labeled as "low-sodium" or "no salt added."  Buy fresh foods. Avoid canned foods and premade or frozen meals. Cooking  Avoid adding salt when cooking. Use salt-free  seasonings or herbs instead of table salt or sea salt. Check with your health care provider or pharmacist before using salt substitutes.  Do not fry foods. Cook foods using healthy methods such as baking, boiling, grilling, and broiling instead.  Cook with heart-healthy oils, such as olive, canola, soybean, or sunflower oil. Meal planning   Eat a balanced diet that includes: ? 5 or more servings of fruits and vegetables each day. At each meal, try to fill half of your plate with fruits and vegetables. ? Up to 6-8 servings of whole grains each day. ? Less than 6 oz of lean meat, poultry, or fish each day. A 3-oz serving of meat is about the same size as a deck of cards. One egg equals 1 oz. ? 2 servings of low-fat dairy each day. ? A serving of nuts, seeds, or beans 5 times each week. ? Heart-healthy fats. Healthy fats called Omega-3 fatty acids are found in foods such as flaxseeds and coldwater fish, like sardines, salmon, and mackerel.  Limit how much you eat of the following: ? Canned or prepackaged foods. ? Food that is high in trans fat, such as fried foods. ? Food that is high in saturated fat, such as fatty meat. ? Sweets, desserts, sugary drinks, and other foods with added sugar. ? Full-fat dairy products.  Do not salt foods before eating.  Try to eat at least 2 vegetarian meals each week.  Eat more home-cooked food and less  restaurant, buffet, and fast food.  When eating at a restaurant, ask that your food be prepared with less salt or no salt, if possible. What foods are recommended? The items listed may not be a complete list. Talk with your dietitian about what dietary choices are best for you. Grains Whole-grain or whole-wheat bread. Whole-grain or whole-wheat pasta. Brown rice. [place] Morrow. Bulgur. Whole-grain and low-sodium cereals. Pita bread. Low-fat, low-sodium crackers. Whole-wheat flour tortillas. Vegetables Fresh or frozen vegetables (raw, steamed, roasted,  or grilled). Low-sodium or reduced-sodium tomato and vegetable juice. Low-sodium or reduced-sodium tomato sauce and tomato paste. Low-sodium or reduced-sodium canned vegetables. Fruits All fresh, dried, or frozen fruit. Canned fruit in natural juice (without added sugar). Meat and other protein foods Skinless chicken or [place]. Ground chicken or [place]. Pork with fat trimmed off. Fish and seafood. Egg whites. Dried beans, peas, or lentils. Unsalted nuts, nut butters, and seeds. Unsalted canned beans. Lean cuts of beef with fat trimmed off. Low-sodium, lean deli meat. Dairy Low-fat (1%) or fat-free (skim) milk. Fat-free, low-fat, or reduced-fat cheeses. Nonfat, low-sodium ricotta or cottage cheese. Low-fat or nonfat yogurt. Low-fat, low-sodium cheese. Fats and oils Soft margarine without trans fats. Vegetable oil. Low-fat, reduced-fat, or light mayonnaise and salad dressings (reduced-sodium). Canola, safflower, olive, soybean, and sunflower oils. Avocado. Seasoning and other foods Herbs. Spices. Seasoning mixes without salt. Unsalted popcorn and pretzels. Fat-free sweets. What foods are not recommended? The items listed may not be a complete list. Talk with your dietitian about what dietary choices are best for you. Grains Baked goods made with fat, such as croissants, muffins, or some breads. Dry pasta or rice meal packs. Vegetables Creamed or fried vegetables. Vegetables in a cheese sauce. Regular canned vegetables (not low-sodium or reduced-sodium). Regular canned tomato sauce and paste (not low-sodium or reduced-sodium). Regular tomato and vegetable juice (not low-sodium or reduced-sodium). Angie Fava. Olives. Fruits Canned fruit in a light or heavy syrup. Fried fruit. Fruit in cream or butter sauce. Meat and other protein foods Fatty cuts of meat. Ribs. Fried meat. Berniece Salines. Sausage. Bologna and other processed lunch meats. Salami. Fatback. Hotdogs. Bratwurst. Salted nuts and seeds. Canned beans  with added salt. Canned or smoked fish. Whole eggs or egg yolks. Chicken or [place] with skin. Dairy Whole or 2% milk, cream, and half-and-half. Whole or full-fat cream cheese. Whole-fat or sweetened yogurt. Full-fat cheese. Nondairy creamers. Whipped toppings. Processed cheese and cheese spreads. Fats and oils Butter. Stick margarine. Lard. Shortening. Ghee. Bacon fat. Tropical oils, such as coconut, palm kernel, or palm oil. Seasoning and other foods Salted popcorn and pretzels. Onion salt, garlic salt, seasoned salt, table salt, and sea salt. Worcestershire sauce. Tartar sauce. Barbecue sauce. Teriyaki sauce. Soy sauce, including reduced-sodium. Steak sauce. Canned and packaged gravies. Fish sauce. Oyster sauce. Cocktail sauce. Horseradish that you find on the shelf. Ketchup. Mustard. Meat flavorings and tenderizers. Bouillon cubes. Hot sauce and Tabasco sauce. Premade or packaged marinades. Premade or packaged taco seasonings. Relishes. Regular salad dressings. Where to find more information:  National Heart, Lung, and [place]: https://wilson-eaton.com/  American Heart Association: www.heart.org Summary  The DASH eating plan is a healthy eating plan that has been shown to reduce high blood pressure (hypertension). It may also reduce your risk for type 2 diabetes, heart disease, and stroke.  With the DASH eating plan, you should limit salt (sodium) intake to 2,300 mg a day. If you have hypertension, you may need to reduce your sodium intake to 1,500 mg a day.  When on the DASH eating plan, aim to eat more fresh fruits and vegetables, whole grains, lean proteins, low-fat dairy, and heart-healthy fats.  Work with your health care provider or diet and nutrition specialist (dietitian) to adjust your eating plan to your individual calorie needs. This information is not intended to replace advice given to you by your health care provider. Make sure you discuss any questions you have with your health  care provider. Document Released: 07/14/2011 Document Revised: 07/18/2016 Document Reviewed: 07/18/2016 Elsevier Interactive Patient Education  [employer].
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
## 2017-11-30 ENCOUNTER — Other Ambulatory Visit: Payer: Self-pay | Admitting: Nurse Practitioner

## 2017-12-05 ENCOUNTER — Telehealth: Payer: Self-pay | Admitting: *Deleted

## 2017-12-05 NOTE — Telephone Encounter (Signed)
Patient daughter, [name] called and stated that Janett Billow prescribed Dexilant on 11/29/17 and it is not covered by her insurance. Stated that it cost OOP $350.00 and they cannot afford that a month.   I called CoverMyMeds and spoke with Jasmine Awe and he will be faxing a Prior Authorization Form to complete and fax back. I could not complete form on CoverMyMeds website. Awaiting Hard Copy Form.   [id number]  Spoke with daughter and informed her.

## 2017-12-06 ENCOUNTER — Encounter: Payer: Self-pay | Admitting: Cardiovascular Disease

## 2017-12-06 ENCOUNTER — Ambulatory Visit: Payer: Medicare HMO | Admitting: Cardiovascular Disease

## 2017-12-06 DIAGNOSIS — G458 Other transient cerebral ischemic attacks and related syndromes: Secondary | ICD-10-CM | POA: Diagnosis not present

## 2017-12-06 DIAGNOSIS — I998 Other disorder of circulatory system: Secondary | ICD-10-CM | POA: Diagnosis not present

## 2017-12-06 DIAGNOSIS — I10 Essential (primary) hypertension: Secondary | ICD-10-CM

## 2017-12-06 DIAGNOSIS — I70229 Atherosclerosis of native arteries of extremities with rest pain, unspecified extremity: Secondary | ICD-10-CM

## 2017-12-06 DIAGNOSIS — I2510 Atherosclerotic heart disease of native coronary artery without angina pectoris: Secondary | ICD-10-CM | POA: Diagnosis not present

## 2017-12-06 DIAGNOSIS — E7800 Pure hypercholesterolemia, unspecified: Secondary | ICD-10-CM | POA: Diagnosis not present

## 2017-12-06 NOTE — Assessment & Plan Note (Signed)
History of CAD status post coronary artery bypass grafting in 1999 by Dr. Arlyce Dice. She was recently admitted to the hospital with chest pain and ruled out for myocardial infarction 11/14/17. Her Myoview stress test was negative. She did have mild volume overload and was gently diuresed and was discharged home. She's had no recurrent symptoms.

## 2017-12-06 NOTE — Patient Instructions (Signed)
We request that you follow-up in: 6 months with an extender and in 12 months with Dr Andria Rhein will receive a reminder letter in the mail two months in advance. If you don't receive a letter, please call our office to schedule the follow-up appointment.

## 2017-12-06 NOTE — Progress Notes (Signed)
12/06/2017 Deanna [name]   11-19-1940  [id number]  Primary Physician Lauree Chandler, NP Primary Cardiologist: Lorretta Harp MD FACP, [place], [place], [place]  HPI:  Deanna [name] is a 77 y.o.  moderately overweight divorced Caucasian female mother of one daughter who I last saw in the office 10/03/17. She was formerly a patient of Dr. Lowella Fairy. I am assuming her care. I have the procedures on her in the past as well. Her primary care physician is Dr. Valetta Fuller. She has a history of coronary artery disease status post anterior wall myocardial infarction in 1994 treated with LAD angioplasty by Dr. [name]. She had circumflex intervention the following year. Ultimately she required coronary bypass grafting in 1999 by Dr. Arlyce Dice . 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 Trula Slade. Other problems include to hypertension and hyperlipidemia. She does not smoke. She is not diabetic. She has a strong family history of heart disease. She denies chest pain, shortness of breath or claudication. She had intervention on her femorofemoral crossover graft by Dr. Brabham1/15/19 and is walking better since. She was admitted to the hospital on 11/14/17 for 2 days because of chest pain. She ruled out for myocardial infarction. A chest CT was negative with only small bilateral pleural effusions. BNP was mildly elevated and she was treated with gentle diuresis. A Myoview stress test was nonischemic and low risk. She's had no recurrent symptoms.     Current Meds  Medication Sig  . acetaminophen (TYLENOL) 325 MG tablet Take 325 mg by mouth every 6 (six) hours as needed (for pain).  Marland Kitchen amLODipine (NORVASC) 5 MG tablet Take 5 mg by mouth daily.  Marland Kitchen aspirin  EC 81 MG tablet Take 81 mg by mouth at bedtime.   . cholecalciferol (VITAMIN D) 400 units TABS tablet Take 400 Units by mouth daily.  . clopidogrel (PLAVIX) 75 MG tablet Take 1 tablet (75 mg total) by mouth once.  . CRESTOR 10 MG tablet TAKE ONE TABLET BY MOUTH DAILY  . Dexlansoprazole (DEXILANT) 30 MG capsule Take 1 capsule (30 mg total) by mouth daily.  . divalproex (DEPAKOTE) 250 MG DR tablet Take 250 mg by mouth daily.  . furosemide (LASIX) 40 MG tablet Take 1 tablet (40 mg total) by mouth daily as needed. Take 1 tablet if you have more than a 2 pound weight gain in 24 hours as needed.  . latanoprost (XALATAN) 0.005 % ophthalmic solution Place 1 drop into both eyes at bedtime.  Marland Kitchen levothyroxine (SYNTHROID, LEVOTHROID) 200 MCG tablet Take 200 mcg by mouth daily before breakfast.   . LORazepam (ATIVAN) 1 MG tablet 1/2 -1 tablet every 8 hours as needed anxiety  . meclizine (ANTIVERT) 25 MG tablet Take 25 mg by mouth 3 (three) times daily as needed for dizziness.  . metoprolol tartrate (LOPRESSOR) 25 MG tablet Take 1 tablet (25 mg total) by mouth 2 (two) times daily. (Patient taking differently: Take 12.5 mg by mouth 2 (two) times daily. )  . mirabegron ER (MYRBETRIQ) 25 MG TB24 tablet Take 25 mg by mouth daily.   . promethazine (PHENERGAN) 12.5 MG tablet Take 12.5 mg by mouth every 6 (six) hours as needed for nausea or vomiting.  . ranitidine (ZANTAC) 150 MG capsule Take 150 mg by mouth daily as needed for  heartburn.   . sertraline (ZOLOFT) 100 MG tablet Take 100 mg by mouth at bedtime.   . Thiamine HCl (VITAMIN B-1) 250 MG tablet Take 250 mg by mouth daily.  Marland Kitchen tiZANidine (ZANAFLEX) 2 MG tablet Take 2 mg by mouth every 6 (six) hours as needed for muscle spasms.  . traMADol (ULTRAM) 50 MG tablet Take 50 mg by mouth every 4 (four) hours as needed (for pain).   . valsartan (DIOVAN) 80 MG tablet Take 80 mg by mouth every morning.   . [DISCONTINUED] Potassium 95 MG TABS Take by mouth.     Allergies   Allergen Reactions  . Donepezil Other (See Comments)    Altered mood, aggression, and caused anger  . Hydrocodone-Acetaminophen Other (See Comments)    "Delirium, confusion, toxic dementia"  . Meloxicam     Pt suspects that this medicine causes fluid on her lungs.   . Memantine Other (See Comments)    Caused severe aggression  . 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 Vicodin [Hydrocodone-Acetaminophen] Other (See Comments)    Delirium, confusion, and toxic dementia    Social History   Socioeconomic History  . Marital status: Divorced    Spouse name: Not on file  . Number of children: Not on file  . Years of education: Not on file  . Highest education level: Not on file  Occupational History  . Not on file  Social Needs  . Financial resource strain: Not on file  . Food insecurity:    Worry: Not on file    Inability: Not on file  . Transportation needs:    Medical: Not on file    Non-medical: Not on file  Tobacco Use  . Smoking status: Former Smoker    Years: 3.00    Types: Cigarettes    Last attempt to quit: 11/27/1981    Years since quitting: 36.0  . Smokeless tobacco: Never Used  . Tobacco comment: Smoked 5 cigarettes per day.   Substance and Sexual Activity  . Alcohol use: Not Currently    Alcohol/week: 0.0 oz    Frequency: Never  . Drug use: No  . Sexual activity: Not Currently    Comment: 1st intercourse 6 yo-1 partner  Lifestyle  . Physical activity:    Days per week: Not on file    Minutes per session: Not on file  . Stress: Not on file  Relationships  . Social connections:    Talks on phone: Not on file    Gets together: Not on file    Attends religious service: Not on file    Active member of club or organization: Not on file    Attends meetings of clubs or organizations: Not on file    Relationship status: Not on file  . Intimate partner violence:    Fear of current or ex partner: Not on  file    Emotionally abused: Not on file    Physically abused: Not on file    Forced sexual activity: Not on file  Other Topics Concern  . Not on file  Social History Narrative   Social History      Diet? Healthy but too many sweets      Do you drink/eat things with caffeine? Yes occasionally      Marital status?                    D  What year were you married?      Do you live in a house, apartment, assisted living, condo, trailer, etc.? condo      Is it one or more stories? 1      How many persons live in your home? 2      Do you have any pets in your home? (please list) 1 cat      Highest level of education completed? 1 year college      Current or past profession: housewife      Do you exercise?           no                           Type & how often?      Advanced Directives      Do you have a living will? no      Do you have a DNR form?             no                     If not, do you want to discuss one? yes      Do you have signed POA/HPOA for forms? no      Functional Status      Do you have difficulty bathing or dressing yourself? No- gets tired      Do you have difficulty preparing food or eating? No- eats frozen entrees or poor nutrition meals      Do you have difficulty managing your medications? yes      Do you have difficulty managing your finances? yes      Do you have difficulty affording your medications? No- funds running low     Review of Systems: General: negative for chills, fever, night sweats or weight changes.  Cardiovascular: negative for chest pain, dyspnea on exertion, edema, orthopnea, palpitations, paroxysmal nocturnal dyspnea or shortness of breath Dermatological: negative for rash Respiratory: negative for cough or wheezing Urologic: negative for hematuria Abdominal: negative for nausea, vomiting, diarrhea, bright red blood per rectum, melena, or hematemesis Neurologic: negative for visual changes, syncope, or  dizziness All other systems reviewed and are otherwise negative except as noted above.    Blood pressure 138/64, pulse 77, height 5' (1.524 m), weight 213 lb (96.6 kg).  General appearance: alert and no distress Neck: no adenopathy, no JVD, supple, symmetrical, trachea midline, thyroid not enlarged, symmetric, no tenderness/mass/nodules and bilateral carotid and subclavian bruits Lungs: clear to auscultation bilaterally Heart: high-pitched outflow tract murmur Extremities: extremities normal, atraumatic, no cyanosis or edema Pulses: 2+ and symmetric Skin: Skin color, texture, turgor normal. No rashes or lesions Neurologic: Alert and oriented X 3, normal strength and tone. Normal symmetric reflexes. Normal coordination and gait  EKG Not performed today  ASSESSMENT AND PLAN:   Coronary artery disease status post CABG History of CAD status post coronary artery bypass grafting in 1999 by Dr. Arlyce Dice. She was recently admitted to the hospital with chest pain and ruled out for myocardial infarction 11/14/17. Her Myoview stress test was negative. She did have mild volume overload and was gently diuresed and was discharged home. She's had no recurrent symptoms.  Essential hypertension History of essential hypertension blood pressure measured 130/64. She is on amlodipine, metoprolol and valsartan. Continue current meds at current dosing.  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 Trula Slade.  Hyperlipidemia History of hyperlipidemia on statin therapy.  Critical lower limb ischemia History of PAD status post femorofemoral bypass grafting by Dr. Trula Slade on 08/22/17 which resulted in improvement in her ability to ambulate.      Lorretta Harp MD FACP,FACC,FAHA, [facility] 12/06/2017 12:53 PM

## 2017-12-06 NOTE — Telephone Encounter (Signed)
Received Prior Authorization from [facility] [id number] for patient's Dexilant 30mg . Placed in [place] folder to review and sign. To be faxed back to [facility] Fax: [phone]

## 2017-12-06 NOTE — Assessment & Plan Note (Signed)
History of PAD status post femorofemoral bypass grafting by Dr. Trula Slade on 08/22/17 which resulted in improvement in her ability to ambulate.

## 2017-12-06 NOTE — Assessment & Plan Note (Signed)
History of hyperlipidemia on statin therapy. 

## 2017-12-06 NOTE — Assessment & Plan Note (Signed)
History of left subclavian artery occlusion status post common carotid to subclavian bypass 12/25/2011 by Dr. Trula Slade.

## 2017-12-06 NOTE — Assessment & Plan Note (Signed)
History of essential hypertension blood pressure measured 130/64. She is on amlodipine, metoprolol and valsartan. Continue current meds at current dosing.

## 2017-12-07 NOTE — Telephone Encounter (Signed)
Received fax from [place] does not require prior authorization. Stated that medication is available under plan.  Member ID#: [id number]

## 2017-12-15 ENCOUNTER — Telehealth: Payer: Self-pay

## 2017-12-15 DIAGNOSIS — S069X9D Unspecified intracranial injury with loss of consciousness of unspecified duration, subsequent encounter: Secondary | ICD-10-CM

## 2017-12-15 NOTE — Telephone Encounter (Signed)
Patient's daughter called requesting referral to [facility]. Patient was under the care of Dr.Willis that left the practice. Patient has neuropsychological results that needs an interpretation

## 2017-12-18 ENCOUNTER — Encounter: Payer: Self-pay | Admitting: Neurology

## 2017-12-18 NOTE — Telephone Encounter (Signed)
Referral signed.

## 2017-12-19 ENCOUNTER — Other Ambulatory Visit: Payer: Self-pay | Admitting: *Deleted

## 2017-12-28 ENCOUNTER — Other Ambulatory Visit: Payer: Medicare HMO

## 2017-12-28 DIAGNOSIS — E785 Hyperlipidemia, unspecified: Secondary | ICD-10-CM | POA: Diagnosis not present

## 2017-12-28 LAB — COMPLETE METABOLIC PANEL WITH GFR
AG Ratio: 1.8 (calc) (ref 1.0–2.5)
ALKALINE PHOSPHATASE (APISO): 72 U/L (ref 33–130)
ALT: 14 U/L (ref 6–29)
AST: 17 U/L (ref 10–35)
Albumin: 4.4 g/dL (ref 3.6–5.1)
BILIRUBIN TOTAL: 0.9 mg/dL (ref 0.2–1.2)
BUN / CREAT RATIO: 19 (calc) (ref 6–22)
BUN: 18 mg/dL (ref 7–25)
CHLORIDE: 103 mmol/L (ref 98–110)
CO2: 25 mmol/L (ref 20–32)
CREATININE: 0.96 mg/dL — AB (ref 0.60–0.93)
Calcium: 9.4 mg/dL (ref 8.6–10.4)
GFR, Est African American: 67 mL/min/{1.73_m2} (ref >=60)
GFR, Est Non African American: 57 mL/min/{1.73_m2} — ABNORMAL LOW (ref >=60)
GLUCOSE: 101 mg/dL — AB (ref 65–99)
Globulin: 2.5 g/dL (calc) (ref 1.9–3.7)
POTASSIUM: 4.6 mmol/L (ref 3.5–5.3)
Sodium: 137 mmol/L (ref 135–146)
TOTAL PROTEIN: 6.9 g/dL (ref 6.1–8.1)

## 2017-12-28 LAB — LIPID PANEL
CHOLESTEROL: 143 mg/dL (ref <200)
HDL: 37 mg/dL — AB (ref >50)
LDL Cholesterol (Calc): 77 mg/dL (calc)
Non-HDL Cholesterol (Calc): 106 mg/dL (calc) (ref <130)
Total CHOL/HDL Ratio: 3.9 (calc) (ref <5.0)
Triglycerides: 195 mg/dL — ABNORMAL HIGH (ref <150)

## 2018-01-02 DIAGNOSIS — F341 Dysthymic disorder: Secondary | ICD-10-CM | POA: Diagnosis not present

## 2018-01-03 ENCOUNTER — Encounter: Payer: Self-pay | Admitting: Nurse Practitioner

## 2018-01-03 ENCOUNTER — Ambulatory Visit (INDEPENDENT_AMBULATORY_CARE_PROVIDER_SITE_OTHER): Payer: Medicare HMO | Admitting: Nurse Practitioner

## 2018-01-03 VITALS — BP 132/84 | HR 89 | Temp 98.0°F | Ht 60.0 in | Wt 213.0 lb

## 2018-01-03 DIAGNOSIS — K219 Gastro-esophageal reflux disease without esophagitis: Secondary | ICD-10-CM

## 2018-01-03 DIAGNOSIS — I2510 Atherosclerotic heart disease of native coronary artery without angina pectoris: Secondary | ICD-10-CM | POA: Diagnosis not present

## 2018-01-03 DIAGNOSIS — F341 Dysthymic disorder: Secondary | ICD-10-CM

## 2018-01-03 DIAGNOSIS — I10 Essential (primary) hypertension: Secondary | ICD-10-CM

## 2018-01-03 DIAGNOSIS — E785 Hyperlipidemia, unspecified: Secondary | ICD-10-CM | POA: Diagnosis not present

## 2018-01-03 MED ORDER — ROSUVASTATIN CALCIUM 20 MG PO TABS: 20.0000 mg | ORAL_TABLET | Freq: Every day | ORAL | 1 refills | Status: DC

## 2018-01-03 NOTE — Patient Instructions (Addendum)
Increase crestor to 20 mg by mouth daily for cholesterol. Continue to work on diet for blood pressure and cholesterol   Follow up in 3 months with blood work prior to visit.

## 2018-01-03 NOTE — Progress Notes (Signed)
Careteam: Patient Care Team: Lauree Chandler, NP as PCP - General (Geriatric Medicine) Irene Shipper, MD as Consulting Physician (Gastroenterology) Frederik Pear, MD as Consulting Physician (Orthopedic Surgery) Phineas Real Belinda Block, MD as Consulting Physician (Gynecology) Rutherford Guys, MD as Consulting Physician (Ophthalmology) Kathrynn Ducking, MD as Consulting Physician (Neurology) Bjorn Loser, MD as Consulting Physician (Urology) Serafina Mitchell, MD as Consulting Physician (Vascular Surgery)  Advanced Directive information Does Patient Have a Medical Advance Directive?: No  Allergies  Allergen Reactions  . Donepezil Other (See Comments)    Altered mood, aggression, and caused anger  . Hydrocodone-Acetaminophen Other (See Comments)    "Delirium, confusion, toxic dementia"  . Meloxicam     Pt suspects that this medicine causes fluid on her lungs.   . Memantine Other (See Comments)    Caused severe aggression  . 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 Vicodin [Hydrocodone-Acetaminophen] Other (See Comments)    Delirium, confusion, and toxic dementia    Chief Complaint  Patient presents with  . Medical Management of Chronic Issues    Pt is being seen for 4 to 6 week routine visit.   Marland Kitchen results    labs printed     HPI: Patient is a 77 y.o. female seen in the office today for 1 month follow up.   HTN- elevated at last visit, improved today, ongoing diet modifications   Hyperlipidemia- has been trying to do better at cutting back on sweet and low cholesterol diet. Taking Crestor 10 mg daily   AKI- renal function improved, drinking more water and avoiding NSAIDS.   Has started seeing geropsychiatry increased Depakote to 500 mg daily- there was improvement with the depakote 250 mg by mouth daily- taking the ER. Due to the TBI- has a hard time prioritizing and remember things outside what she is doing that  minute.    GERD- increased indigestion/GERD dexilant was prescribed but too expensive, has been an issue for a long time. Was worked up in the past without abnormal findings. Does not want further work up.  Only taking ranitidine at this time which is NOT helping.   Ongoing back pain which she was told due to worsening hip pain, got an injection. Took tramadol and tylenol today before she went out.   Review of Systems:  Review of Systems  Constitutional: Negative for chills, fever, malaise/fatigue and weight loss.  HENT: Negative for sore throat and tinnitus.   Respiratory: Negative for cough, sputum production and shortness of breath.   Cardiovascular: Negative for chest pain, palpitations and leg swelling.  Gastrointestinal: Positive for heartburn. Negative for abdominal pain, constipation and diarrhea.  Genitourinary: Negative for dysuria, frequency and urgency.       Incontinence   Musculoskeletal: Positive for back pain, joint pain and myalgias. Negative for falls.  Skin: Negative.   Neurological: Negative for dizziness and headaches.  Psychiatric/Behavioral: Positive for depression. Negative for memory loss. The patient is nervous/anxious and has insomnia.     Past Medical History:  Diagnosis Date  . AMI 11/17/2008   Qualifier: Diagnosis of  By: Marland Mcalpine    . Anxiety   . Atherosclerotic peripheral vascular disease ([place])   . Bacteremia, escherichia coli 04/27/2015  . Brachial-basilar insufficiency syndrome   . CAD (coronary artery disease)    followed by dr [name].  . Celiac artery stenosis ([place])   . CHF (congestive heart failure) ([place])  11/14/2017  . Chronic bilateral low back pain without sciatica   . Cognitive impairment   . Colon polyps   . Community acquired pneumonia   . Depression   . Diverticulosis   . Dysuria   . Encephalomalacia   . GAD (generalized anxiety disorder)   . GERD (gastroesophageal reflux disease)   . Glaucoma   . Hearing loss   .  Hemorrhoids   . Hypertension   . Hypothyroidism   . Incontinence   . Mixed hyperlipidemia   . Myocardial infarction ([place])   . OSA on CPAP   . Peripheral arterial disease ([place])   . Pneumonitis 04/26/2015  . Pyelonephritis   . Pyelonephritis due to Escherichia coli 04/28/2015  . Sepsis ([place])   . Sinus drainage    took z-pack   finished yesterday  . Spondylosis of lumbar region without myelopathy or radiculopathy   . 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[place])   . Urticaria   . Vertigo, benign positional    Past Surgical History:  Procedure Laterality Date  . ABDOMINAL AORTOGRAM W/LOWER EXTREMITY N/A 08/22/2017   Procedure: ABDOMINAL AORTOGRAM W/LOWER EXTREMITY;  Surgeon: Serafina Mitchell, MD;  Location: [place] [facility];  Service: Cardiovascular;  Laterality: N/A;  . BILATERAL UPPER EXTREMITY ANGIOGRAM N/A 09/11/2012   Procedure: BILATERAL UPPER EXTREMITY ANGIOGRAM;  Surgeon: Serafina Mitchell, MD;  Location[facility] CATH LAB;  Service: Cardiovascular;  Laterality: N/A;  . carotid duplex doppler Bilateral 09/03/2012, 11/03/2011   Evidence of 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 patent left common carotid- subclavian artery bypass with turbulent flow noted at the anastomosis with velocities of 295 cm/s  . CAROTID-SUBCLAVIAN BYPASS GRAFT  12/15/2011   Procedure: BYPASS GRAFT CAROTID-SUBCLAVIAN;  Surgeon: Serafina Mitchell, MD;  Location: [facility] OR;  Service: Vascular;  Laterality: Left;  Left Carotid subclavian bypass  . CORONARY ANGIOPLASTY    . CORONARY ARTERY BYPASS GRAFT    . DOPPLER ECHOCARDIOGRAPHY  05/27/2010, 09/17/2008   Mild Proximal septal thickening is noted. Left ventricular systolic functions is normal ejection fraction =>55%. the aortic valve appears to be mildly sclerotic   . fem-fem bypass graft  1999  . holter monitor  01/21/2008   The predominant  rhythm was normal sinus rhythm. Minimum heartrate of 63 bpm at +01:00, maximum heartrate of 105 bpm at + 10:35; and the average heartrate of 75 bpm. Ventricular ectopic activity totaled 1270: Multifocal; 866-PVC's and 404-VEs             . JOINT REPLACEMENT     Left knee  . LEFT HEART CATHETERIZATION WITH CORONARY/GRAFT ANGIOGRAM N/A 12/21/2011   Procedure: LEFT HEART CATHETERIZATION WITH Beatrix Fetters;  Surgeon: Leonie Man, MD;  Location: [facility] CATH LAB;  Service: Cardiovascular;  Laterality: N/A;  . NM MYOCAR PERF EJECTION FRACTION  09/22/2009, 07/03/2007   the post stress myocardial perfusion images show a normal pattern of perfusion is all regions. The post-stress ejection fraction is 68 %. no significant wall motion abnormalities noted. This is a low risk scan.  Marland Kitchen PERIPHERAL VASCULAR INTERVENTION  08/22/2017   Procedure: PERIPHERAL VASCULAR INTERVENTION;  Surgeon: Serafina Mitchell, MD;  Location: [place] [facility] LAB;  Service: Cardiovascular;;  Fem/Fem Graft  . REPLACEMENT TOTAL KNEE  05-2011  . UNILATERAL UPPER EXTREMEITY ANGIOGRAM Left 11/15/2011   Procedure: UNILATERAL UPPER EXTREMEITY ANGIOGRAM;  Surgeon: Lorretta Harp, MD;  Location[facility] CATH LAB;  Service: Cardiovascular;  Laterality: Left;   Social History:   reports that she quit smoking about 36 years ago. Her smoking use included cigarettes. She quit after 3.00 years of use. She has never used smokeless tobacco. She reports that she drank alcohol. She reports that she does not use drugs.  Family History  Problem Relation Age of Onset  . Heart attack Mother   . Heart disease Mother        before age 72  . Diabetes Father   . Heart disease Father   . Hypertension Father   . Hyperlipidemia Father   . Heart attack Father 61  . Heart attack Brother 21  . Cerebral palsy Sister 18  . Congestive Heart Failure Sister 62  . Heart attack Sister 79  . Hypertension Sister 37  . Dementia Sister   . Anxiety disorder Sister     . Anxiety disorder Sister 70  . Heart Problems Sister   . Stroke Sister   . Heart Problems Sister 52  . Heart attack Sister 51  . Colon cancer Brother 48  . Prostate cancer Brother 46  . Hypertension Daughter   . Irritable bowel syndrome Daughter   . Depression Daughter   . Anxiety disorder Daughter     Medications: Patient's Medications  New Prescriptions   No medications on file  Previous Medications   ACETAMINOPHEN (TYLENOL) 325 MG TABLET    Take 325 mg by mouth every 6 (six) hours as needed (for pain).   AMLODIPINE (NORVASC) 5 MG TABLET    Take 5 mg by mouth daily.   ASPIRIN EC 81 MG TABLET    Take 81 mg by mouth at bedtime.    CHOLECALCIFEROL (VITAMIN D) 400 UNITS TABS TABLET    Take 400 Units by mouth daily.   CLOPIDOGREL (PLAVIX) 75 MG TABLET    Take 1 tablet (75 mg total) by mouth once.   CRESTOR 10 MG TABLET    TAKE ONE TABLET BY MOUTH DAILY   DEXLANSOPRAZOLE (DEXILANT) 30 MG CAPSULE    Take 1 capsule (30 mg total) by mouth daily.   DIVALPROEX (DEPAKOTE) 500 MG DR TABLET    Take 500 mg by mouth at bedtime.   FUROSEMIDE (LASIX) 40 MG TABLET    Take 1 tablet (40 mg total) by mouth daily as needed. Take 1 tablet if you have more than a 2 pound weight gain in 24 hours as needed.   LATANOPROST (XALATAN) 0.005 % OPHTHALMIC SOLUTION    Place 1 drop into both eyes at bedtime.   LEVOTHYROXINE (SYNTHROID, LEVOTHROID) 200 MCG TABLET    Take 200 mcg by mouth daily before breakfast.    LORAZEPAM (ATIVAN) 1 MG TABLET    1/2 -1 tablet every 8 hours as needed anxiety   MECLIZINE (ANTIVERT) 25 MG TABLET    Take 25 mg by mouth 3 (three) times daily as needed for dizziness.   METOPROLOL TARTRATE (LOPRESSOR) 25 MG TABLET    Take 1 tablet (25 mg total) by mouth 2 (two) times daily.   MIRABEGRON ER (MYRBETRIQ) 25 MG TB24 TABLET    Take 25 mg by mouth daily.    PROMETHAZINE (PHENERGAN) 12.5 MG TABLET    Take 12.5 mg by mouth every 6 (six) hours as needed for nausea or vomiting.   RANITIDINE  (ZANTAC) 150 MG CAPSULE    Take 150 mg by mouth daily as needed for heartburn.    SERTRALINE (ZOLOFT) 100 MG TABLET    Take  100 mg by mouth at bedtime.    THIAMINE HCL (VITAMIN B-1) 250 MG TABLET    Take 250 mg by mouth daily.   TIZANIDINE (ZANAFLEX) 2 MG TABLET    Take 2 mg by mouth every 6 (six) hours as needed for muscle spasms.   TRAMADOL (ULTRAM) 50 MG TABLET    Take 50 mg by mouth every 4 (four) hours as needed (for pain).    VALSARTAN (DIOVAN) 80 MG TABLET    Take 80 mg by mouth every morning.   Modified Medications   No medications on file  Discontinued Medications   DIVALPROEX (DEPAKOTE) 250 MG DR TABLET    Take 250 mg by mouth daily.     Physical Exam:  Vitals:   01/03/18 1514  BP: 132/84  Pulse: 89  Temp: 98 F (36.7 C)  TempSrc: Oral  SpO2: 97%  Weight: 213 lb (96.6 kg)  Height: 5' (1.524 m)   Body mass index is 41.6 kg/m.  Physical Exam  Constitutional: She is oriented to person, place, and time. She appears well-developed and well-nourished. No distress.  HENT:  Head: Normocephalic and atraumatic.  Mouth/Throat: Oropharynx is clear and moist. No oropharyngeal exudate.  Eyes: Pupils are equal, round, and reactive to light. Conjunctivae are normal.  Neck: Normal range of motion. Neck supple.  Cardiovascular: Normal rate, regular rhythm and normal heart sounds.  Pulmonary/Chest: Effort normal and breath sounds normal.  Abdominal: Soft. Bowel sounds are normal.  Musculoskeletal: She exhibits no edema or tenderness.  Neurological: She is alert and oriented to person, place, and time.  Skin: Skin is warm and dry. She is not diaphoretic.  Psychiatric: She has a normal mood and affect.    Labs reviewed: Basic Metabolic Panel: Recent Labs    11/14/17 2217  11/16/17 0522 11/29/17 1459 12/28/17 1045  NA  --    < > 138 135 137  K  --    < > 4.3 5.2 4.6  CL  --    < > 98* 101 103  CO2  --    < > 28 27 25   GLUCOSE  --    < > 132* 98 101*  BUN  --    < > 27*  46* 18  CREATININE 0.92   < > 1.59* 1.53* 0.96*  CALCIUM  --    < > 9.0 9.7 9.4  MG 2.0  --   --   --   --   TSH 4.355  --   --   --   --    < > = values in this interval not displayed.   Liver Function Tests: Recent Labs    12/28/17 1045  AST 17  ALT 14  BILITOT 0.9  PROT 6.9   No results for input(s): LIPASE, AMYLASE in the last 8760 hours. No results for input(s): AMMONIA in the last 8760 hours. CBC: Recent Labs    11/14/17 1303 11/14/17 2217 11/15/17 0417  WBC 6.0 5.8 7.4  HGB 14.0 14.7 13.8  HCT 42.1 43.5 42.4  MCV 85.2 84.6 85.8  PLT 117* 121* 133*   Lipid Panel: Recent Labs    12/28/17 1045  CHOL 143  HDL 37*  LDLCALC 77  TRIG 195*  CHOLHDL 3.9   TSH: Recent Labs    11/14/17 2217  TSH 4.355   A1C: No results found for: HGBA1C   Assessment/Plan 1. Hyperlipidemia LDL goal <70 Not at goal despite dietary changes, will increase crestor to 20  mg daily and pt to continue to work on lifestyle modifications - rosuvastatin (CRESTOR) 20 MG tablet; Take 1 tablet (20 mg total) by mouth daily.  Dispense: 90 tablet; Refill: 1 - COMPLETE METABOLIC PANEL WITH GFR; Future - Lipid Panel; Future  2. Gastroesophageal reflux disease, esophagitis presence not specified Ongoing GERD, daughter plans to follow up with insurance company on price of dexilant. In the meantime can try OTC nexium daily to see if there is any benefit.   3. Coronary artery disease involving native coronary artery of native heart without angina pectoris Stable, continues on ASA 81 mg daily, blood pressure at goal will cont statin for lipids.  - CBC with Differential/Platelets; Future  4. Essential hypertension Improved today. Continue current regimen with dietary modifications.   5. Anxiety/depression Following with geropsychiatry with benefit, recent increase to depakote to 500 mg qhs which has been beneficial.   Next appt: 05/03/2018 [name]. [place], [place]  [facility] [phone]

## 2018-01-30 ENCOUNTER — Other Ambulatory Visit: Payer: Self-pay | Admitting: *Deleted

## 2018-01-30 DIAGNOSIS — K219 Gastro-esophageal reflux disease without esophagitis: Secondary | ICD-10-CM

## 2018-01-30 MED ORDER — DEXLANSOPRAZOLE 30 MG PO CPDR: 30.0000 mg | DELAYED_RELEASE_CAPSULE | Freq: Every day | ORAL | 1 refills | Status: DC

## 2018-01-30 MED ORDER — TRAMADOL HCL 50 MG PO TABS: 50.0000 mg | ORAL_TABLET | ORAL | 1 refills | Status: DC | PRN

## 2018-01-30 NOTE — Telephone Encounter (Signed)
[place]

## 2018-01-30 NOTE — Telephone Encounter (Signed)
Received Rx from [facility].  Pended Rx and sent to [facility] for approval. High Alert Warning.  Vincent Verified LR: 10/18/2017 (filled by Precious Haws)

## 2018-01-31 ENCOUNTER — Other Ambulatory Visit: Payer: Self-pay | Admitting: *Deleted

## 2018-01-31 MED ORDER — TRAMADOL HCL 50 MG PO TABS: 50.0000 mg | ORAL_TABLET | ORAL | 0 refills | Status: DC | PRN

## 2018-01-31 NOTE — Telephone Encounter (Signed)
Patient's daughter Deanna [name] called and asked if it would be possible to send in a few days supply of Tramadol to [facility] to cover her until the mail order Rx arrives. Mail order was sent in yesterday and patient is down to 2 pills.

## 2018-01-31 NOTE — Telephone Encounter (Signed)
Patient's daughter notified.

## 2018-02-28 ENCOUNTER — Other Ambulatory Visit: Payer: Self-pay

## 2018-02-28 DIAGNOSIS — M79609 Pain in unspecified limb: Secondary | ICD-10-CM

## 2018-02-28 DIAGNOSIS — I739 Peripheral vascular disease, unspecified: Secondary | ICD-10-CM

## 2018-02-28 DIAGNOSIS — I724 Aneurysm of artery of lower extremity: Secondary | ICD-10-CM

## 2018-02-28 DIAGNOSIS — I70213 Atherosclerosis of native arteries of extremities with intermittent claudication, bilateral legs: Secondary | ICD-10-CM

## 2018-02-28 DIAGNOSIS — I998 Other disorder of circulatory system: Secondary | ICD-10-CM

## 2018-02-28 DIAGNOSIS — I70229 Atherosclerosis of native arteries of extremities with rest pain, unspecified extremity: Secondary | ICD-10-CM

## 2018-02-28 DIAGNOSIS — I779 Disorder of arteries and arterioles, unspecified: Secondary | ICD-10-CM

## 2018-03-07 ENCOUNTER — Encounter: Payer: Self-pay | Admitting: Neurology

## 2018-03-07 ENCOUNTER — Telehealth: Payer: Self-pay | Admitting: Neurology

## 2018-03-07 ENCOUNTER — Ambulatory Visit: Payer: Medicare HMO | Admitting: Neurology

## 2018-03-07 VITALS — BP 100/64 | HR 80 | Ht 61.5 in | Wt 213.0 lb

## 2018-03-07 DIAGNOSIS — Z87820 Personal history of traumatic brain injury: Secondary | ICD-10-CM

## 2018-03-07 DIAGNOSIS — G3184 Mild cognitive impairment, so stated: Secondary | ICD-10-CM | POA: Diagnosis not present

## 2018-03-07 DIAGNOSIS — F419 Anxiety disorder, unspecified: Secondary | ICD-10-CM | POA: Diagnosis not present

## 2018-03-07 DIAGNOSIS — Z9989 Dependence on other enabling machines and devices: Secondary | ICD-10-CM

## 2018-03-07 DIAGNOSIS — G4733 Obstructive sleep apnea (adult) (pediatric): Secondary | ICD-10-CM | POA: Diagnosis not present

## 2018-03-07 NOTE — Patient Instructions (Addendum)
1. Continue working with Geripsychiatry for anxiety and mood 2. Recommend joining day programs 3. Exercise has been found to helpful for memory 4. Continue CPAP for sleep apnea 5. Follow-up in 6 months, call for any changes  FALL PRECAUTIONS: Be cautious when walking. Scan the area for obstacles that may increase the risk of trips and falls. When getting up in the mornings, sit up at the edge of the bed for a few minutes before getting out of bed. Consider elevating the bed at the head end to avoid drop of blood pressure when getting up. Walk always in a well-lit room (use night lights in the walls). Avoid area rugs or power cords from appliances in the middle of the walkways. Use a walker or a cane if necessary and consider physical therapy for balance exercise. Get your eyesight checked regularly.  FINANCIAL OVERSIGHT: Supervision, especially oversight when making financial decisions or transactions is also recommended.  HOME SAFETY: Consider the safety of the kitchen when operating appliances like stoves, microwave oven, and blender. Consider having supervision and share cooking responsibilities until no longer able to participate in those. Accidents with firearms and other hazards in the house should be identified and addressed as well.  DRIVING: Regarding driving, in patients with progressive memory problems, driving will be impaired. We advise to have someone else do the driving if trouble finding directions or if minor accidents are reported. Independent driving assessment is available to determine safety of driving.  ABILITY TO BE LEFT ALONE: If patient is unable to contact 911 operator, consider using LifeLine, or when the need is there, arrange for someone to stay with patients. Smoking is a fire hazard, consider supervision or cessation. Risk of wandering should be assessed by caregiver and if detected at any point, supervision and safe proof recommendations should be  instituted.  MEDICATION SUPERVISION: Inability to self-administer medication needs to be constantly addressed. Implement a mechanism to ensure safe administration of the medications.  RECOMMENDATIONS FOR ALL PATIENTS WITH MEMORY PROBLEMS: 1. Continue to exercise (Recommend 30 minutes of walking everyday, or 3 hours every week) 2. Increase social interactions - continue going to [place] and enjoy social gatherings with friends and family 3. Eat healthy, avoid fried foods and eat more fruits and vegetables 4. Maintain adequate blood pressure, blood sugar, and blood cholesterol level. Reducing the risk of stroke and cardiovascular disease also helps promoting better memory. 5. Avoid stressful situations. Live a simple life and avoid aggravations. Organize your time and prepare for the next day in anticipation. 6. Sleep well, avoid any interruptions of sleep and avoid any distractions in the bedroom that may interfere with adequate sleep quality 7. Avoid sugar, avoid sweets as there is a strong link between excessive sugar intake, diabetes, and cognitive impairment We discussed the Mediterranean diet, which has been shown to help patients reduce the risk of progressive memory disorders and reduces cardiovascular risk. This includes eating fish, eat fruits and green leafy vegetables, nuts like almonds and hazelnuts, walnuts, and also use olive oil. Avoid fast foods and fried foods as much as possible. Avoid sweets and sugar as sugar use has been linked to worsening of memory function.  There is always a concern of gradual progression of memory problems. If this is the case, then we may need to adjust level of care according to patient needs. Support, both to the patient and caregiver, should then be put into place.

## 2018-03-07 NOTE — Telephone Encounter (Signed)
Spoke with Deanna [name] who advised me to send a new CPAP order for patient to transfer care of this to Dr. Delice Lesch. Order sent. Message sent to [facility] letting them know.
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
## 2018-03-07 NOTE — Telephone Encounter (Signed)
Thanks

## 2018-03-13 ENCOUNTER — Other Ambulatory Visit: Payer: Self-pay | Admitting: *Deleted

## 2018-03-15 ENCOUNTER — Other Ambulatory Visit: Payer: Self-pay

## 2018-03-15 ENCOUNTER — Telehealth: Payer: Self-pay | Admitting: *Deleted

## 2018-03-15 DIAGNOSIS — K219 Gastro-esophageal reflux disease without esophagitis: Secondary | ICD-10-CM

## 2018-03-15 MED ORDER — PANTOPRAZOLE SODIUM 40 MG PO TBEC: 40.0000 mg | DELAYED_RELEASE_TABLET | Freq: Every day | ORAL | 3 refills | Status: DC

## 2018-03-15 MED ORDER — CLOPIDOGREL BISULFATE 75 MG PO TABS: 75.0000 mg | ORAL_TABLET | Freq: Every day | ORAL | 2 refills | Status: DC

## 2018-03-15 NOTE — Telephone Encounter (Signed)
Patient called and stated that she has been seen several times for GERD. Stated that she is having Heartburn, Upset stomach and Indigestion. Stated that she is taking Zantac 150mg  for it with no relief. Tried to get Dexilant but it was too expensive. Patient is requesting something different to take to help with symptoms. Please Advise.  (Jessica's Patient)   Wants a Rx sent to [place].

## 2018-03-15 NOTE — Telephone Encounter (Signed)
I have sent in protonix 40mg  in place of zantac or dexilant.   All other medications in the PPI family will interact with her plavix making it not work as well.

## 2018-03-16 ENCOUNTER — Other Ambulatory Visit: Payer: Self-pay | Admitting: Internal Medicine

## 2018-03-16 DIAGNOSIS — K219 Gastro-esophageal reflux disease without esophagitis: Secondary | ICD-10-CM

## 2018-03-16 MED ORDER — PANTOPRAZOLE SODIUM 40 MG PO TBEC: 40.0000 mg | DELAYED_RELEASE_TABLET | Freq: Every day | ORAL | 0 refills | Status: DC

## 2018-03-16 NOTE — Telephone Encounter (Signed)
Patient aware rx sent to [facility]. Patient asked that I send 30 day supply to local pharmacy while she waits for mailorder.  RX sent as requested

## 2018-03-19 ENCOUNTER — Ambulatory Visit (INDEPENDENT_AMBULATORY_CARE_PROVIDER_SITE_OTHER): Payer: Medicare HMO | Admitting: Family

## 2018-03-19 ENCOUNTER — Encounter (HOSPITAL_COMMUNITY): Payer: Medicare HMO

## 2018-03-19 ENCOUNTER — Other Ambulatory Visit: Payer: Self-pay

## 2018-03-19 ENCOUNTER — Other Ambulatory Visit: Payer: Self-pay | Admitting: Family

## 2018-03-19 ENCOUNTER — Ambulatory Visit: Payer: Medicare HMO | Admitting: Surgery

## 2018-03-19 ENCOUNTER — Ambulatory Visit (HOSPITAL_COMMUNITY)
Admission: RE | Admit: 2018-03-19 | Discharge: 2018-03-19 | Disposition: A | Discharge status: Home / Self Care | Payer: Medicare HMO | Source: Ambulatory Visit | Attending: Family | Admitting: Family

## 2018-03-19 ENCOUNTER — Encounter: Payer: Self-pay | Admitting: Family

## 2018-03-19 ENCOUNTER — Ambulatory Visit (INDEPENDENT_AMBULATORY_CARE_PROVIDER_SITE_OTHER)
Admission: RE | Admit: 2018-03-19 | Discharge: 2018-03-19 | Disposition: A | Discharge status: Home / Self Care | Payer: Medicare HMO | Source: Ambulatory Visit | Attending: Family | Admitting: Family

## 2018-03-19 VITALS — BP 118/58 | HR 65 | Temp 97.6°F | Resp 20 | Ht 61.5 in | Wt 213.0 lb

## 2018-03-19 DIAGNOSIS — I998 Other disorder of circulatory system: Secondary | ICD-10-CM

## 2018-03-19 DIAGNOSIS — I724 Aneurysm of artery of lower extremity: Secondary | ICD-10-CM

## 2018-03-19 DIAGNOSIS — I779 Disorder of arteries and arterioles, unspecified: Secondary | ICD-10-CM

## 2018-03-19 DIAGNOSIS — Z87891 Personal history of nicotine dependence: Secondary | ICD-10-CM

## 2018-03-19 DIAGNOSIS — I6523 Occlusion and stenosis of bilateral carotid arteries: Secondary | ICD-10-CM

## 2018-03-19 DIAGNOSIS — R1031 Right lower quadrant pain: Secondary | ICD-10-CM

## 2018-03-19 DIAGNOSIS — I739 Peripheral vascular disease, unspecified: Secondary | ICD-10-CM | POA: Diagnosis not present

## 2018-03-19 DIAGNOSIS — I70213 Atherosclerosis of native arteries of extremities with intermittent claudication, bilateral legs: Secondary | ICD-10-CM

## 2018-03-19 DIAGNOSIS — I70229 Atherosclerosis of native arteries of extremities with rest pain, unspecified extremity: Secondary | ICD-10-CM

## 2018-03-19 DIAGNOSIS — M79609 Pain in unspecified limb: Secondary | ICD-10-CM | POA: Diagnosis not present

## 2018-03-19 NOTE — Progress Notes (Addendum)
[facility] [place] HISTORY AND PHYSICAL   CC: intermittent right groin to right low back severe pain x 2-7 days with history of PAD    History of Present Illness:   Deanna [name] is a 77 y.o. female whohas a history of undergoing left carotid to subclavian artery bypass graft on 12/15/2011 by Dr. Trula Slade. This was done in the setting of dizziness and left arm numbness. Her postoperative course was complicated by a seroma which became infected and required drainage. Ultimately she recovered.  She reports occasional numbness, tingling sensation in left upper extremity.   She has a history of a femoral-femoral bypass graft by Dr.Burney.She was found to have a mid graft pseudoaneurysm in 2017. She was having difficulty with pain in her right hip as well as her back and was sent for vascular evaluation.  She is also s/p ultrasound-guided access via left femoral artery, right lower extremity runoff, drug-coated balloon angioplasty of right limb of the femoral femoral bypass graft, and endovascular aneurysm repair of a pseudoaneurysm off of the femoral femoral bypass graft on 08-22-17. By Dr. 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  When I saw her on 11-27-17 I advised that she return in 3 months with fem-fem bypass graft duplex, ABI's, and see Dr. Trula Slade.    She did not return until today, she came to the office, unscheduled, with c/o right groin pain that travels proximally around to her low back, this started 2 days ago, was sharp and burning, happened when not walking, seems alleviated somewhat with lying flat. She has known arthritis in her back, per daughter.   Pt's daughter states pt's appointment was for today at 2:30, and states they were not notified that her appointment was changed.  Daughter states pt has been receiving steroid injections in her painful right hip.   She reports great improvement in her legs since the  January 2019 vascular surgery. She also states she has spurs in her lumbar spine, this is followed by Dr. Hal Morales, and that she still has pain in her right hip.   Daughter states pt has a hx of a traumatic brain injury.     Diabetic: No Tobacco use: former smoker, quit in 1983   Pt meds include: Statin :Yes Betablocker: Yes ASA: Yes Other anticoagulants/antiplatelets: Plavix     Current Outpatient Medications  Medication Sig Dispense Refill  . acetaminophen (TYLENOL) 325 MG tablet Take 325 mg by mouth every 6 (six) hours as needed (for pain).    Marland Kitchen amLODipine (NORVASC) 5 MG tablet Take 5 mg by mouth daily.    Marland Kitchen aspirin EC 81 MG tablet Take 81 mg by mouth at bedtime.     . cholecalciferol (VITAMIN D) 400 units TABS tablet Take 400 Units by mouth daily.    . clopidogrel (PLAVIX) 75 MG tablet Take 1 tablet (75 mg total) by mouth daily. 90 tablet 2  . divalproex (DEPAKOTE) 500 MG DR tablet Take 500 mg by mouth at bedtime.    . furosemide (LASIX) 40 MG tablet Take 1 tablet (40 mg total) by mouth daily as needed. Take 1 tablet if you have more than a 2 pound weight gain in 24 hours as needed. 30 tablet 11  . latanoprost (XALATAN) 0.005 % ophthalmic solution Place 1 drop into both eyes at bedtime.    Marland Kitchen levothyroxine (SYNTHROID, LEVOTHROID) 200 MCG tablet Take 200 mcg by mouth daily before breakfast.     . LORazepam (  ATIVAN) 1 MG tablet 1/2 -1 tablet every 8 hours as needed anxiety 30 tablet   . meclizine (ANTIVERT) 25 MG tablet Take 25 mg by mouth 3 (three) times daily as needed for dizziness.    . metoprolol tartrate (LOPRESSOR) 25 MG tablet Take 1 tablet (25 mg total) by mouth 2 (two) times daily. (Patient taking differently: Take 12.5 mg by mouth 2 (two) times daily. ) 180 tablet 1  . mirabegron ER (MYRBETRIQ) 25 MG TB24 tablet Take 25 mg by mouth daily.     . pantoprazole (PROTONIX) 40 MG tablet Take 1 tablet (40 mg total) by mouth daily. Awaiting mail order supply 30 tablet 0  .  promethazine (PHENERGAN) 12.5 MG tablet Take 12.5 mg by mouth every 6 (six) hours as needed for nausea or vomiting.    . rosuvastatin (CRESTOR) 20 MG tablet Take 1 tablet (20 mg total) by mouth daily. 90 tablet 1  . sertraline (ZOLOFT) 100 MG tablet Take 100 mg by mouth at bedtime.     . Thiamine HCl (VITAMIN B-1) 250 MG tablet Take 250 mg by mouth daily.    Marland Kitchen tiZANidine (ZANAFLEX) 2 MG tablet Take 2 mg by mouth every 6 (six) hours as needed for muscle spasms.    . traMADol (ULTRAM) 50 MG tablet Take 1 tablet (50 mg total) by mouth every 4 (four) hours as needed (for pain). 10 tablet 0  . valsartan (DIOVAN) 80 MG tablet Take 80 mg by mouth every morning.      No current facility-administered medications for this visit.     Past Medical History:  Diagnosis Date  . AMI 11/17/2008   Qualifier: Diagnosis of  By: Marland Mcalpine    . Anxiety   . Atherosclerotic peripheral vascular disease ([place])   . Bacteremia, escherichia coli 04/27/2015  . Brachial-basilar insufficiency syndrome   . CAD (coronary artery disease)    followed by dr [name].  . Celiac artery stenosis ([place])   . CHF (congestive heart failure) ([place]) 11/14/2017  . Chronic bilateral low back pain without sciatica   . Cognitive impairment   . Colon polyps   . Community acquired pneumonia   . Depression   . Diverticulosis   . Dysuria   . Encephalomalacia   . GAD (generalized anxiety disorder)   . GERD (gastroesophageal reflux disease)   . Glaucoma   . Hearing loss   . Hemorrhoids   . Hypertension   . Hypothyroidism   . Incontinence   . Mixed hyperlipidemia   . Myocardial infarction ([place])   . OSA on CPAP   . Peripheral arterial disease ([place])   . Pneumonitis 04/26/2015  . Pyelonephritis   . Pyelonephritis due to Escherichia coli 04/28/2015  . Sepsis ([place])   . Sinus drainage    took z-pack   finished yesterday  . Spondylosis of lumbar region without myelopathy or radiculopathy   . 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[place])    . Urticaria   . Vertigo, benign positional     Social History Social History   Tobacco Use  . Smoking status: Former Smoker    Years: 3.00    Types: Cigarettes    Last attempt to quit: 11/27/1981    Years since quitting: 36.3  . Smokeless tobacco: Never Used  Substance Use Topics  . Alcohol use: Not Currently    Alcohol/week: 0.0 standard drinks    Frequency: Never  . Drug use: No    Family History Family History  Problem Relation  Age of Onset  . Heart attack Mother   . Heart disease Mother        before age 13  . Diabetes Father   . Heart disease Father   . Hypertension Father   . Hyperlipidemia Father   . Heart attack Father 73  . Heart attack Brother 74  . Cerebral palsy Sister 87  . Congestive Heart Failure Sister 79  . Heart attack Sister 54  . Hypertension Sister 41  . Dementia Sister   . Anxiety disorder Sister   . Anxiety disorder Sister 77  . Heart Problems Sister   . Stroke Sister   . Heart Problems Sister 7  . Heart attack Sister 77  . Colon cancer Brother 64  . Prostate cancer Brother 36  . Hypertension Daughter   . Irritable bowel syndrome Daughter   . Depression Daughter   . Anxiety disorder Daughter     Surgical History Past Surgical History:  Procedure Laterality Date  . ABDOMINAL AORTOGRAM W/LOWER EXTREMITY N/A 08/22/2017   Procedure: ABDOMINAL AORTOGRAM W/LOWER EXTREMITY;  Surgeon: Serafina Mitchell, MD;  Location: [facility];  Service: Cardiovascular;  Laterality: N/A;  . BILATERAL UPPER EXTREMITY ANGIOGRAM N/A 09/11/2012   Procedure: BILATERAL UPPER EXTREMITY ANGIOGRAM;  Surgeon: Serafina Mitchell, MD;  Location: [facility] CATH LAB;  Service: Cardiovascular;  Laterality: N/A;  . carotid duplex doppler Bilateral 09/03/2012, 11/03/2011   Evidence of 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 patent left  common carotid- subclavian artery bypass with turbulent flow noted at the anastomosis with velocities of 295 cm/s  . CAROTID-SUBCLAVIAN BYPASS GRAFT  12/15/2011   Procedure: BYPASS GRAFT CAROTID-SUBCLAVIAN;  Surgeon: Serafina Mitchell, MD;  Location: [facility] OR;  Service: Vascular;  Laterality: Left;  Left Carotid subclavian bypass  . CORONARY ANGIOPLASTY    . CORONARY ARTERY BYPASS GRAFT    . DOPPLER ECHOCARDIOGRAPHY  05/27/2010, 09/17/2008   Mild Proximal septal thickening is noted. Left ventricular systolic functions is normal ejection fraction =>55%. the aortic valve appears to be mildly sclerotic   . fem-fem bypass graft  1999  . holter monitor  01/21/2008   The predominant rhythm was normal sinus rhythm. Minimum heartrate of 63 bpm at +01:00, maximum heartrate of 105 bpm at + 10:35; and the average heartrate of 75 bpm. Ventricular ectopic activity totaled 1270: Multifocal; 866-PVC's and 404-VEs             . JOINT REPLACEMENT     Left knee  . LEFT HEART CATHETERIZATION WITH CORONARY/GRAFT ANGIOGRAM N/A 12/21/2011   Procedure: LEFT HEART CATHETERIZATION WITH Beatrix Fetters;  Surgeon: Leonie Man, MD;  Location: [facility] CATH LAB;  Service: Cardiovascular;  Laterality: N/A;  . NM MYOCAR PERF EJECTION FRACTION  09/22/2009, 07/03/2007   the post stress myocardial perfusion images show a normal pattern of perfusion is all regions. The post-stress ejection fraction is 68 %. no significant wall motion abnormalities noted. This is a low risk scan.  Marland Kitchen PERIPHERAL VASCULAR INTERVENTION  08/22/2017   Procedure: PERIPHERAL VASCULAR INTERVENTION;  Surgeon: Serafina Mitchell, MD;  Location: [facility];  Service: Cardiovascular;;  Fem/Fem Graft  . REPLACEMENT TOTAL KNEE  05-2011  . UNILATERAL UPPER EXTREMEITY ANGIOGRAM Left 11/15/2011   Procedure: UNILATERAL UPPER EXTREMEITY ANGIOGRAM;  Surgeon: Lorretta Harp, MD;  Location: [facility] CATH LAB;  Service: Cardiovascular;  Laterality: Left;    Allergies  Allergen Reactions  . Donepezil Other (See Comments)    Altered mood, aggression, and caused anger  . Hydrocodone-Acetaminophen Other (See Comments)    "Delirium, confusion, toxic dementia"  . Meloxicam     Pt suspects that this medicine causes fluid on her lungs.   . Memantine Other (See Comments)    Caused severe aggression  . 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 Vicodin [Hydrocodone-Acetaminophen] Other (See Comments)    Delirium, confusion, and toxic dementia    Current Outpatient Medications  Medication Sig Dispense Refill  . acetaminophen (TYLENOL) 325 MG tablet Take 325 mg by mouth every 6 (six) hours as needed (for pain).    Marland Kitchen amLODipine (NORVASC) 5 MG tablet Take 5 mg by mouth daily.    Marland Kitchen aspirin EC 81 MG tablet Take 81 mg by mouth at bedtime.     . cholecalciferol (VITAMIN D) 400 units TABS tablet Take 400 Units by mouth daily.    . clopidogrel (PLAVIX) 75 MG tablet Take 1 tablet (75 mg total) by mouth daily. 90 tablet 2  . divalproex (DEPAKOTE) 500 MG DR tablet Take 500 mg by mouth at bedtime.    . furosemide (LASIX) 40 MG tablet Take 1 tablet (40 mg total) by mouth daily as needed. Take 1 tablet if you have more than a 2 pound weight gain in 24 hours as needed. 30 tablet 11  . latanoprost (XALATAN) 0.005 % ophthalmic solution Place 1 drop into both eyes at bedtime.    Marland Kitchen levothyroxine (SYNTHROID, LEVOTHROID) 200 MCG tablet Take 200 mcg by mouth daily before breakfast.     . LORazepam (ATIVAN) 1 MG tablet 1/2 -1 tablet every 8 hours as needed anxiety 30 tablet   . meclizine (ANTIVERT) 25 MG tablet Take 25 mg by mouth 3 (three) times daily as needed for dizziness.    . metoprolol tartrate (LOPRESSOR) 25 MG tablet Take 1 tablet (25 mg total) by mouth 2 (two) times daily. (Patient taking differently: Take 12.5 mg by mouth 2 (two) times daily. ) 180 tablet 1  . mirabegron ER (MYRBETRIQ) 25 MG TB24 tablet Take 25 mg by mouth  daily.     . pantoprazole (PROTONIX) 40 MG tablet Take 1 tablet (40 mg total) by mouth daily. Awaiting mail order supply 30 tablet 0  . promethazine (PHENERGAN) 12.5 MG tablet Take 12.5 mg by mouth every 6 (six) hours as needed for nausea or vomiting.    . rosuvastatin (CRESTOR) 20 MG tablet Take 1 tablet (20 mg total) by mouth daily. 90 tablet 1  . sertraline (ZOLOFT) 100 MG tablet Take 100 mg by mouth at bedtime.     . Thiamine HCl (VITAMIN B-1) 250 MG tablet Take 250 mg by mouth daily.    Marland Kitchen tiZANidine (ZANAFLEX) 2 MG tablet Take 2 mg by mouth every 6 (six) hours as needed for muscle spasms.    . traMADol (ULTRAM) 50 MG tablet Take 1 tablet (50 mg total) by mouth every 4 (four) hours as needed (for pain). 10 tablet 0  . valsartan (DIOVAN) 80 MG tablet Take 80 mg by mouth every morning.      No current facility-administered medications for this visit.      REVIEW OF SYSTEMS: See HPI for pertinent positives and negatives.  Physical Examination Vitals:   03/19/18 1459  BP: (!) 118/58  Pulse: 65  Resp: 20  Temp: 97.6 F (36.4 C)  TempSrc:  Oral  SpO2: 94%  Weight: 213 lb (96.6 kg)  Height: 5' 1.5" (1.562 m)   Body mass index is 39.59 kg/m.  General:  A&O x 3, WDWN, obese female. Gait: normal HENT: No gross abnormalities.  Eyes: PERRLA. Pulmonary: Respirations are non labored, CTAB, distant breath sounds Cardiac: regular rhythm, no detected murmur.        Carotid Bruits Right Left   Negative Negative   Radial pulses are 2+ palpable right, not palpable left, left brachial is faintly palpable  Adominal aortic pulse is not palpable      Fem-fem bypass graft pulse is not palpable, but pt is morbidly obese                     VASCULAR EXAM: Extremities without ischemic changes, without Gangrene; without open wounds.                                                                                                                                                       LE Pulses  Right Left       FEMORAL  2+ palpable  2+ palpable        POPLITEAL  not palpable   not palpable       POSTERIOR TIBIAL  not palpable   not palpable        DORSALIS PEDIS      ANTERIOR TIBIAL not palpable  1+ palpable    Abdomen: soft, NT, no palpable masses, large panus. Skin: no rashes, no cellulitis, no ulcers noted. Musculoskeletal: no muscle wasting or atrophy.      Neurologic: A&O X 3; appropriate affect, Sensation is normal; MOTOR FUNCTION:  moving all extremities equally, motor strength 5/5 throughout. Speech is fluent/normal. CN 2-12 intact except is hard of hearing. Psychiatric: Thought content is not cohesive, mood appropriate for clinical situation.    ASSESSMENT:  RONIYAH LLORENS is a 77 y.o. female who is s/p ultrasound-guided access via left femoral artery, right lower extremity runoff, drug-coated balloon angioplasty of right limb of the femoral femoral bypass graft, and endovascular aneurysm repair of a pseudoaneurysm off of the femoral femoral bypass graft on 08-22-17. By Dr. 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 She has a history of a femoral-femoral bypass graft by Dr.Burney years ago.  She is also s/p left carotid to subclavian artery bypass graft on 12/15/2011 by Dr. Trula Slade. This was done in the setting of dizziness and left arm numbness.  She no longer has dizziness, no longer has numbness in her left arm, but her left radial pulse is not palpable, and left brachial pulse is faintly palpable. No signs of ischemia in her left hand.  Left brachial pressure is 56 mm Hg less than right. Dampened left brachial artery waveforms on ABI's.  11-27-17 Fem-fem duplex velocity of 332 at the distal anastomosis compared to 426 cm/s on 08-14-17, before the 08-22-17 angioplasty, and that pt's leg sx's have improved, but she does not walk much out of habit per daughter.  Pt is seeing Dr. Hal Morales for right hip and low back pain.  There  are no signs of ischemia in her feet or legs, and despite her obesity and large panus, bilateral femoral pulses are 2+ palpable. But I am unable to palpate a pulse in the fem-fem bypass graft.   Vascular lab is able to fit in fem-fem bypass graft duplex and ABI's today.    At 11-27-17 visit Dr. Donnetta Hutching advised 3 months follow up with same studies as today, see Dr. Trula Slade afterward.   Today's non invasive vascular lab findings, physical exam results, and HPI indicate that pt's right hip and back pain are not due to vascular arterial stenosis. She has known right hip problems and lumbar spine problems.  Also consider UTI or kidney stone.  Daughter states pt has an appointment with her orthopedist later this month, and will speak with pt's PCP re the other possible differential diagnoses.   Practice Manager Donald Pore spoke with pt and her daughter re pt scheduling.    DATA  Fem-Fem Bypass Graft Duplex (03-19-18): Inflow with velocity of 305 cm/s >70% stenosis in the distal anastomosis (325 cm/s), however, this may be due to tortuosity.   Fem-Fem Bypass Graft Duplex (11/27/17): Inflow left: 301 cm/s; Distal anastomosis: 332 cm/s, this may be partially due to slight tortuosity.  Right anastomosis measures measures 0.98 cm x 0.88 cm. Left anastomosis measures 1.02 cm x 0.98 cm.     ABI (Date: 03/19/2018):  R:   ABI: not performed (was 0.56),   PT: bi  DP: bi  TBI:  Not performed  L:   ABI: not performed (was 0.97),   PT: mono  DP: tri  TBI: not performed    ABI (Date: 11/27/2017):  R:  ? ABI: 0.56 (was 0.78 on 08-14-17),  ? PT: bi ? DP: bi ? TBI:  0.36 (was 0.37)  L:  ? ABI: 0.97 (was 1.07),  ? PT: bi ? DP: bi ? TBI: 0.63 (was 0.53) ? Decline in right ABI to moderate disease, with stable TBI, biphasic waveforms. ? Stable left ABI, normal range, and TBI, biphasic waveforms.    Carotid Duplex (10-03-17): Right Carotid: Velocities in the right ICA are  consistent with a 60-79%        stenosis. Hemodynamically significant plaque >50% visualized in        the CCA. The ECA appears >50% stenosed. The RICA velocities are        elevated and stable compared to the prior exam. The RECA        velocities are elevated and have increased compared to the prior        exam.  Left Carotid: Velocities in the left ICA are consistent with a 40-59% stenosis.       Non-hemodynamically significant plaque noted in the CCA. The LICA       velocities are elevated and essentially stable compared to the       prior exam.  Vertebrals: Both vertebral arteries were patent with antegrade flow. Subclavians: Right subclavian artery was stenotic. Left subclavian steal       syndrome; patent proximal CCA to subclavian artery bypass graft.        There is a 41 mmHG difference between the arms,  with the left being       the lowest     PLAN:  Based on the patient's vascular studies and examination, pt will return to clinic in 3 months with carotid duplex, fem-fem bypass graft duplex, ABI's, and see Dr. Trula Slade.    I discussed in depth with the patient the nature of atherosclerosis, and emphasized the importance of maximal medical management including strict control of blood pressure, blood glucose, and lipid levels, obtaining regular exercise, and cessation of smoking.  The patient is aware that without maximal medical management the underlying atherosclerotic disease process will progress, limiting the benefit of any interventions.  The patient was given information about stroke prevention and what symptoms should prompt the patient to seek immediate medical care.  The patient was given information about PAD including signs, symptoms, treatment, what symptoms should prompt the patient to seek immediate medical care, and risk reduction measures to take.  Thank you for allowing [place]  to participate in this patient's care.  Clemon Chambers, RN, MSN, FNP-C [facility] Office: [phone]  Clinic MD: Trula Slade  03/19/2018 3:54 PM

## 2018-03-19 NOTE — Patient Instructions (Addendum)
Stroke Prevention Some health problems and behaviors may make it more likely for you to have a stroke. Below are ways to lessen your risk of having a stroke.  Be active for at least 30 minutes on most or all days.  Do not smoke. Try not to be around others who smoke.  Do not drink too much alcohol. ? Do not have more than 2 drinks a day if you are a man. ? Do not have more than 1 drink a day if you are a woman and are not pregnant.  Eat healthy foods, such as fruits and vegetables. If you were put on a specific diet, follow the diet as told.  Keep your cholesterol levels under control through diet and medicines. Look for foods that are low in saturated fat, trans fat, cholesterol, and are high in fiber.  If you have diabetes, follow all diet plans and take your medicine as told.  Ask your doctor if you need treatment to lower your blood pressure. If you have high blood pressure (hypertension), follow all diet plans and take your medicine as told by your doctor.  If you are 18-39 years old, have your blood pressure checked every 3-5 years. If you are age 40 or older, have your blood pressure checked every year.  Keep a healthy weight. Eat foods that are low in calories, salt, saturated fat, trans fat, and cholesterol.  Do not take drugs.  Avoid birth control pills, if this applies. Talk to your doctor about the risks of taking birth control pills.  Talk to your doctor if you have sleep problems (sleep apnea).  Take all medicine as told by your doctor. ? You may be told to take aspirin or blood thinner medicine. Take this medicine as told by your doctor. ? Understand your medicine instructions.  Make sure any other conditions you have are being taken care of.  Get help right away if:  You suddenly lose feeling (you feel numb) or have weakness in your face, arm, or leg.  Your face or eyelid hangs down to one side.  You suddenly feel confused.  You have trouble talking  (aphasia) or understanding what people are saying.  You suddenly have trouble seeing in one or both eyes.  You suddenly have trouble walking.  You are dizzy.  You lose your balance or your movements are clumsy (uncoordinated).  You suddenly have a very bad headache and you do not know the cause.  You have new chest pain.  Your heart feels like it is fluttering or skipping a beat (irregular heartbeat). Do not wait to see if the symptoms above go away. Get help right away. Call your local emergency services (911 in [place] Do not drive yourself to the hospital. This information is not intended to replace advice given to you by your health care provider. Make sure you discuss any questions you have with your health care provider. Document Released: 01/24/2012 Document Revised: 12/31/2015 Document Reviewed: 01/25/2013 Elsevier Interactive Patient Education  2018 Elsevier Inc.     Peripheral Vascular Disease Peripheral vascular disease (PVD) is a disease of the blood vessels that are not part of your heart and brain. A simple term for PVD is poor circulation. In most cases, PVD narrows the blood vessels that carry blood from your heart to the rest of your body. This can result in a decreased supply of blood to your arms, legs, and internal organs, like your stomach or kidneys. However, it most often affects   a person's lower legs and feet. There are two types of PVD.  Organic PVD. This is the more common type. It is caused by damage to the structure of blood vessels.  Functional PVD. This is caused by conditions that make blood vessels contract and tighten (spasm).  Without treatment, PVD tends to get worse over time. PVD can also lead to acute ischemic limb. This is when an arm or limb suddenly has trouble getting enough blood. This is a medical emergency. Follow these instructions at home:  Take medicines only as told by your doctor.  Do not use any tobacco products, including  cigarettes, chewing tobacco, or electronic cigarettes. If you need help quitting, ask your doctor.  Lose weight if you are overweight, and maintain a healthy weight as told by your doctor.  Eat a diet that is low in fat and cholesterol. If you need help, ask your doctor.  Exercise regularly. Ask your doctor for some good activities for you.  Take good care of your feet. ? Wear comfortable shoes that fit well. ? Check your feet often for any cuts or sores. Contact a doctor if:  You have cramps in your legs while walking.  You have leg pain when you are at rest.  You have coldness in a leg or foot.  Your skin changes.  You are unable to get or have an erection (erectile dysfunction).  You have cuts or sores on your feet that are not healing. Get help right away if:  Your arm or leg turns cold and blue.  Your arms or legs become red, warm, swollen, painful, or numb.  You have chest pain or trouble breathing.  You suddenly have weakness in your face, arm, or leg.  You become very confused or you cannot speak.  You suddenly have a very bad headache.  You suddenly cannot see. This information is not intended to replace advice given to you by your health care provider. Make sure you discuss any questions you have with your health care provider. Document Released: 10/19/2009 Document Revised: 12/31/2015 Document Reviewed: 01/02/2014 Elsevier Interactive Patient Education  2017 Elsevier Inc.  

## 2018-03-20 ENCOUNTER — Other Ambulatory Visit: Payer: Self-pay

## 2018-03-20 ENCOUNTER — Other Ambulatory Visit: Payer: Self-pay | Admitting: Family

## 2018-03-20 ENCOUNTER — Encounter: Payer: Self-pay | Admitting: Neurology

## 2018-03-20 DIAGNOSIS — I6523 Occlusion and stenosis of bilateral carotid arteries: Secondary | ICD-10-CM

## 2018-03-20 DIAGNOSIS — I779 Disorder of arteries and arterioles, unspecified: Secondary | ICD-10-CM

## 2018-03-20 DIAGNOSIS — R1031 Right lower quadrant pain: Secondary | ICD-10-CM

## 2018-03-20 DIAGNOSIS — I70213 Atherosclerosis of native arteries of extremities with intermittent claudication, bilateral legs: Secondary | ICD-10-CM

## 2018-03-20 DIAGNOSIS — I724 Aneurysm of artery of lower extremity: Secondary | ICD-10-CM

## 2018-03-22 DIAGNOSIS — F341 Dysthymic disorder: Secondary | ICD-10-CM | POA: Diagnosis not present

## 2018-03-26 ENCOUNTER — Ambulatory Visit: Payer: Medicare HMO | Admitting: Surgery

## 2018-03-26 ENCOUNTER — Encounter (HOSPITAL_COMMUNITY): Payer: Medicare HMO

## 2018-03-26 DIAGNOSIS — H43811 Vitreous degeneration, right eye: Secondary | ICD-10-CM | POA: Diagnosis not present

## 2018-03-26 DIAGNOSIS — H35371 Puckering of macula, right eye: Secondary | ICD-10-CM | POA: Diagnosis not present

## 2018-03-26 DIAGNOSIS — H35433 Paving stone degeneration of retina, bilateral: Secondary | ICD-10-CM | POA: Diagnosis not present

## 2018-03-26 DIAGNOSIS — H35033 Hypertensive retinopathy, bilateral: Secondary | ICD-10-CM | POA: Diagnosis not present

## 2018-04-05 DIAGNOSIS — M1611 Unilateral primary osteoarthritis, right hip: Secondary | ICD-10-CM | POA: Diagnosis not present

## 2018-04-10 ENCOUNTER — Other Ambulatory Visit: Payer: Self-pay | Admitting: Nurse Practitioner

## 2018-04-13 ENCOUNTER — Telehealth: Payer: Self-pay

## 2018-04-13 NOTE — Telephone Encounter (Signed)
I informed patient's daughter of Dr.Carter's response.   [name] states her mother is about 200 lbs, has the mentality of a 76 year old, she is the only caregiver and it is not able to get her up, out and about to be sitting in some random urgent care. [name] states "You all are her primary caregivers and you are acting like you don't understand my situation, her last doctor would just send in an antibiotic, my mother is really sick, tell the doctor I said thanks for nothing and I might have to find her another primary care provider."  Call was ended.

## 2018-04-13 NOTE — Telephone Encounter (Signed)
Spoke with patient's daughter [name]. Patient started with fever last night, and urinated about every hour. Patient also c/o nausea and weakness. Patient with lower back pain x a few days.   The UTI test is a OTC test, Leucocytes was positive. Patient was given a tylenol and promethazine for nausea.  Please advise if ABX can be called in

## 2018-04-13 NOTE — Telephone Encounter (Signed)
She needs to be seen to determine if abx appropriate; if she is unable to get pt to UC, recommend they bring urine sample to office to be sent for UA cx and sens

## 2018-04-13 NOTE — Telephone Encounter (Signed)
Message left on clinical intake voicemail:   Patient with nausea and fever. Patient did a UTI test and it was positive. Patient's daughter is requesting ABX to [facility]. Patient's daughter stated that last UTI turned into sepsis.  Left message on voicemail for patient to return call when available

## 2018-04-14 DIAGNOSIS — N390 Urinary tract infection, site not specified: Secondary | ICD-10-CM | POA: Diagnosis not present

## 2018-04-15 NOTE — Telephone Encounter (Signed)
It is unfortunate that she feels that her mother has a "mentality of a 76 year old". Neurology evaluated her in July 2019 and dx her with mild cognitive impairment. She really needs to have a urine sample submitted in order to dx her properly per national guidelines. Please let us know if there is anything else we can do for her.

## 2018-04-16 NOTE — Telephone Encounter (Signed)
Repeat UA not necessary unless patient is still having UTI symptoms per national guidelines.

## 2018-04-16 NOTE — Telephone Encounter (Signed)
Noted  

## 2018-04-16 NOTE — Telephone Encounter (Signed)
I called Deanna [name] to follow-up, patient was seen at [facility] on [address].  Patient was put on Cipro 500 mg bid x 5 days. Deanna [name] questions if she should being a urine sample in to be rechecked once ABX completed.   Please advise   Deanna [name] apologized for the way she acted on Friday and states she is so worried about her mom and her dad doesn't have long to live.  Deanna [name] admits she was not acting like herself and she apologized to me and to [place] for acting out. I expressed empathy for her situation and informed her I will follow-up after consulting with provider.

## 2018-04-16 NOTE — Telephone Encounter (Signed)
Per patient's daughter, patient has urinary incontinence and her mentality makes it hard to assess her symptoms

## 2018-04-18 ENCOUNTER — Other Ambulatory Visit: Payer: Self-pay | Admitting: Internal Medicine

## 2018-04-18 ENCOUNTER — Other Ambulatory Visit: Payer: Self-pay | Admitting: *Deleted

## 2018-04-18 DIAGNOSIS — K219 Gastro-esophageal reflux disease without esophagitis: Secondary | ICD-10-CM

## 2018-04-18 MED ORDER — LEVOTHYROXINE SODIUM 200 MCG PO TABS: 200.0000 ug | ORAL_TABLET | Freq: Every day | ORAL | 1 refills | Status: DC

## 2018-04-18 MED ORDER — VALSARTAN 80 MG PO TABS: 80.0000 mg | ORAL_TABLET | Freq: Every morning | ORAL | 1 refills | Status: DC

## 2018-04-18 NOTE — Telephone Encounter (Signed)
Patient daughter requested refills to be sent to [facility]

## 2018-04-26 DIAGNOSIS — H401134 Primary open-angle glaucoma, bilateral, indeterminate stage: Secondary | ICD-10-CM | POA: Diagnosis not present

## 2018-04-28 ENCOUNTER — Other Ambulatory Visit: Payer: Self-pay | Admitting: Nurse Practitioner

## 2018-04-30 NOTE — Telephone Encounter (Signed)
According to [place] [facility] rx written on 01/30/18 was filled on 04/20/18 for # 10 pills    Please advise if rx to be refilled for #30 or more

## 2018-05-01 NOTE — Telephone Encounter (Signed)
Looks like she's been on the tramadol a long time and several other treatments for her back.  She sees Janett Billow 10/1.  Let's give her #30 for now to get her through to next week.

## 2018-05-03 ENCOUNTER — Other Ambulatory Visit: Payer: Medicare HMO

## 2018-05-04 NOTE — Telephone Encounter (Signed)
Rx called in for #30 as authorized

## 2018-05-08 ENCOUNTER — Ambulatory Visit: Payer: Medicare HMO | Admitting: Nurse Practitioner

## 2018-05-11 ENCOUNTER — Telehealth: Payer: Self-pay | Admitting: Psychiatry

## 2018-05-11 ENCOUNTER — Other Ambulatory Visit: Payer: Self-pay

## 2018-05-11 MED ORDER — DIVALPROEX SODIUM 500 MG PO DR TAB: 500.0000 mg | DELAYED_RELEASE_TABLET | Freq: Every day | ORAL | 0 refills | Status: DC

## 2018-05-11 MED ORDER — DIVALPROEX SODIUM 500 MG PO DR TAB: 500.0000 mg | DELAYED_RELEASE_TABLET | Freq: Every day | ORAL | 1 refills | Status: DC

## 2018-05-11 MED ORDER — DIVALPROEX SODIUM ER 250 MG PO TB24: ORAL_TABLET | ORAL | 1 refills | Status: DC

## 2018-05-11 NOTE — Telephone Encounter (Signed)
Pt daughter called and said that her mom Deanna [name] needs a refill on her generic of depakote. She takes 250 mg and 500 mg. She is completely out of the 500mg . It needs to be called into the [place] mail order pharmacy. The 500 mg needs to be called into the [facility] on [address]

## 2018-05-11 NOTE — Telephone Encounter (Signed)
Spoke with daughter. Sent 30 day [facility]mg  to [facility] and a 90 day of both strengths to [facility]

## 2018-05-14 DIAGNOSIS — H401132 Primary open-angle glaucoma, bilateral, moderate stage: Secondary | ICD-10-CM | POA: Diagnosis not present

## 2018-05-15 ENCOUNTER — Telehealth: Payer: Self-pay

## 2018-05-15 NOTE — Telephone Encounter (Signed)
-----   Message from Lauree Chandler, NP sent at 05/07/2018 11:23 AM EDT ----- Pt called and cancelled appt and stated they were going to reschedule. Please let them be aware they need to reschedule labs as well since did not get prior labs. Thank you.   ----- Message ----- From: SYSTEM Sent: 05/07/2018  12:09 AM EDT To: Lauree Chandler, NP

## 2018-05-15 NOTE — Telephone Encounter (Signed)
I left a message on daughter's voicemail asking that they call and reschedule cancelled lab and follow up appointments.

## 2018-05-23 ENCOUNTER — Ambulatory Visit: Payer: Medicare HMO | Admitting: Psychiatry

## 2018-05-23 ENCOUNTER — Encounter: Payer: Self-pay | Admitting: Psychiatry

## 2018-05-23 VITALS — BP 126/60 | HR 82 | Ht 60.0 in

## 2018-05-23 DIAGNOSIS — F411 Generalized anxiety disorder: Secondary | ICD-10-CM

## 2018-05-23 DIAGNOSIS — F39 Unspecified mood [affective] disorder: Secondary | ICD-10-CM

## 2018-05-23 MED ORDER — DULOXETINE HCL 30 MG PO CPEP: 30.0000 mg | ORAL_CAPSULE | Freq: Every day | ORAL | 1 refills | Status: DC

## 2018-05-23 MED ORDER — ALPRAZOLAM 0.5 MG PO TABS: 0.5000 mg | ORAL_TABLET | Freq: Every day | ORAL | 0 refills | Status: DC | PRN

## 2018-05-23 NOTE — Patient Instructions (Signed)
Start Cymbalta (duloxetine) 30 mg in the morning for anxiety and depression.  Decrease Sertraline to 100 mg 1/2 tablet (50 mg) for one week, and then stop.

## 2018-05-23 NOTE — Progress Notes (Signed)
Deanna [name] [id number] [birth year] 77 y.o.  Subjective:   Patient ID:  Deanna [name] is a 77 y.o. (DOB Dec 21, 1940) female.  Chief Complaint:  Chief Complaint  Patient presents with  . Anxiety  . Depression    HPI JIAH BARI presents to the office today for follow-up of mood and anxiety. She is accompanied by her daughter. Daughter reports that Depakote has been helpful for her mood and that mood is more stable. Daughter reports that mother often will not take her Xanax when she experiences acute anxiety. Daughter reports pt will often take just a half tab of Xanax when she does take anxiety medication. Daughter reports that pt's anxiety is ok on days she does not have appointments. Both pt and her daughter report that pt's ex-husband having terminal illness has been stressful and a triggering event. Pt reports that she continues to have difficulty accepting that her ex-husband left the marriage. Daughter reports that pt has low energy and motivation on days when they do not have appointments. Pt reports that she has frequent sad mood. Pt reports that her energy is "terrible." She reports "I despise exercise." Will do some light housework. Motivation low at times. She reports adequate sleep. Describes appetite as "too good." Daughter reports that pt has poor concentration and will spend an entire day working on the mail and will make different piles and move things from one pile to the next. Pt reports that she has difficulty making decisions. Reports passive death wishes. Denies SI. Daughter reports that pt will frequently say that she feels like her life is over.   Medications: I have reviewed the patient's current medications.  Medication Side Effects: None  Allergies:  Allergies  Allergen Reactions  . Donepezil Other (See Comments)    Altered mood, aggression, and caused anger  . Hydrocodone-Acetaminophen Other (See Comments)    "Delirium, confusion, toxic dementia"  .  Meloxicam     Pt suspects that this medicine causes fluid on her lungs.   . Memantine Other (See Comments)    Caused severe aggression  . 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 Vicodin [Hydrocodone-Acetaminophen] Other (See Comments)    Delirium, confusion, and toxic dementia    Past Medical History:  Diagnosis Date  . AMI 11/17/2008   Qualifier: Diagnosis of  By: Marland Mcalpine    . Anxiety   . Atherosclerotic peripheral vascular disease ([place])   . Bacteremia, escherichia coli 04/27/2015  . Brachial-basilar insufficiency syndrome   . CAD (coronary artery disease)    followed by dr [name].  . Celiac artery stenosis ([place])   . CHF (congestive heart failure) ([place]) 11/14/2017  . Chronic bilateral low back pain without sciatica   . Cognitive impairment   . Colon polyps   . Community acquired pneumonia   . Depression   . Diverticulosis   . Dysuria   . Encephalomalacia   . GAD (generalized anxiety disorder)   . GERD (gastroesophageal reflux disease)   . Glaucoma   . Hearing loss   . Hemorrhoids   . Hypertension   . Hypothyroidism   . Incontinence   . Mixed hyperlipidemia   . Myocardial infarction ([place])   . OSA on CPAP   . Peripheral arterial disease ([place])   . Pneumonitis 04/26/2015  . Pyelonephritis   . Pyelonephritis due to Escherichia coli 04/28/2015  . Sepsis ([place])   . Sinus drainage  took z-pack   finished yesterday  . Spondylosis of lumbar region without myelopathy or radiculopathy   . 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[place])   . Urticaria   . Vertigo, benign positional     Family History  Problem Relation Age of Onset  . Heart attack Mother   . Heart disease Mother        before age 33  . Diabetes Father   . Heart disease Father   . Hypertension Father   . Hyperlipidemia Father   . Heart attack Father 48  . Heart attack Brother 24  . Cerebral palsy Sister 59  . Congestive Heart Failure Sister 39  .  Heart attack Sister 26  . Hypertension Sister 77  . Dementia Sister   . Anxiety disorder Sister   . Anxiety disorder Sister 33  . Heart Problems Sister   . Stroke Sister   . Heart Problems Sister 27  . Heart attack Sister 32  . Colon cancer Brother 32  . Prostate cancer Brother 16  . Hypertension Daughter   . Irritable bowel syndrome Daughter   . Depression Daughter   . Anxiety disorder Daughter     Social History   Socioeconomic History  . Marital status: Divorced    Spouse name: Not on file  . Number of children: Not on file  . Years of education: Not on file  . Highest education level: Not on file  Occupational History  . Not on file  Social Needs  . Financial resource strain: Not on file  . Food insecurity:    Worry: Not on file    Inability: Not on file  . Transportation needs:    Medical: Not on file    Non-medical: Not on file  Tobacco Use  . Smoking status: Former Smoker    Years: 3.00    Types: Cigarettes    Last attempt to quit: 11/27/1981    Years since quitting: 36.5  . Smokeless tobacco: Never Used  Substance and Sexual Activity  . Alcohol use: Not Currently    Alcohol/week: 0.0 standard drinks    Frequency: Never  . Drug use: No  . Sexual activity: Not Currently    Comment: 1st intercourse 39 yo-1 partner  Lifestyle  . Physical activity:    Days per week: Not on file    Minutes per session: Not on file  . Stress: Not on file  Relationships  . Social connections:    Talks on phone: Not on file    Gets together: Not on file    Attends religious service: Not on file    Active member of club or organization: Not on file    Attends meetings of clubs or organizations: Not on file    Relationship status: Not on file  . Intimate partner violence:    Fear of current or ex partner: Not on file    Emotionally abused: Not on file    Physically abused: Not on file    Forced sexual activity: Not on file  Other Topics Concern  . Not on file  Social  History Narrative   Social History      Diet? Healthy but too many sweets      Do you drink/eat things with caffeine? Yes occasionally      Marital status?                    D  What year were you married?      Do you live in a house, apartment, assisted living, condo, trailer, etc.? condo      Is it one or more stories? 1      How many persons live in your home? 2      Do you have any pets in your home? (please list) 1 cat      Highest level of education completed? 1 year college      Current or past profession: housewife      Do you exercise?           no                           Type & how often?      Advanced Directives      Do you have a living will? no      Do you have a DNR form?             no                     If not, do you want to discuss one? yes      Do you have signed POA/HPOA for forms? no      Functional Status      Do you have difficulty bathing or dressing yourself? No- gets tired      Do you have difficulty preparing food or eating? No- eats frozen entrees or poor nutrition meals      Do you have difficulty managing your medications? yes      Do you have difficulty managing your finances? yes      Do you have difficulty affording your medications? No- funds running low    Past Medical History, Surgical history, Social history, and Family history were reviewed and updated as appropriate.   Please see review of systems for further details on the patient's review from today.   Review of Systems:  Review of Systems  Eyes: Positive for visual disturbance.  Gastrointestinal: Positive for nausea.  Neurological: Positive for tremors.  Psychiatric/Behavioral: Positive for decreased concentration and dysphoric mood. Negative for sleep disturbance and suicidal ideas. The patient is nervous/anxious.     Objective:   Physical Exam:  BP 126/60   Pulse 82   Ht 5' (1.524 m)   BMI 41.60 kg/m   Physical Exam  Constitutional: She  appears well-developed.  Musculoskeletal: She exhibits no deformity.  Neurological: She is alert. She displays tremor.  Psychiatric: Her speech is normal and behavior is normal. Judgment and thought content normal. Cognition and memory are impaired. She exhibits a depressed mood. She exhibits abnormal recent memory.    Lab Review:     Component Value Date/Time   NA 137 12/28/2017 1045   K 4.6 12/28/2017 1045   CL 103 12/28/2017 1045   CO2 25 12/28/2017 1045   GLUCOSE 101 (H) 12/28/2017 1045   BUN 18 12/28/2017 1045   CREATININE 0.96 (H) 12/28/2017 1045   CALCIUM 9.4 12/28/2017 1045   PROT 6.9 12/28/2017 1045   ALBUMIN 4.6 12/18/2015 1000   AST 17 12/28/2017 1045   ALT 14 12/28/2017 1045   ALKPHOS 81 12/18/2015 1000   BILITOT 0.9 12/28/2017 1045   GFRNONAA 57 (L) 12/28/2017 1045   GFRAA 67 12/28/2017 1045       Component Value Date/Time   WBC 7.4 11/15/2017 0417   RBC 4.94 11/15/2017  0417   HGB 13.8 11/15/2017 0417   HCT 42.4 11/15/2017 0417   PLT 133 (L) 11/15/2017 0417   MCV 85.8 11/15/2017 0417   MCH 27.9 11/15/2017 0417   MCHC 32.5 11/15/2017 0417   RDW 13.3 11/15/2017 0417   LYMPHSABS 0.9 04/27/2015 0310   MONOABS 0.5 04/27/2015 0310   EOSABS 0.0 04/27/2015 0310   BASOSABS 0.0 04/27/2015 0310    No results found for: POCLITH, LITHIUM   No results found for: PHENYTOIN, PHENOBARB, VALPROATE, CBMZ   .res Assessment: Plan:   Patient seen for 45 minutes and greater than 50% of visit spent counseling patient and her daughter regarding potential benefits, risks, and side effects of Cymbalta and its proposed mechanism of action and how this differs from past medications.  Discussed decreasing sertraline to 50 mg daily for 1 week and then discontinuing to minimize risk of discontinuation signs and symptoms.  Discussed option of starting low-dose Cymbalta versus titration to usual target dose of 60 mg daily.  Patient and daughter indicate they would prefer to start with  low dose and then reassess in 6 weeks and determine if further titration is needed at that time.  Advised patient to take Cymbalta in the morning to minimize risk of sleep disturbance.  Will continue Depakote ER 250 mg in the morning and 500 mg at bedtime since this is been helpful for irritability and mood lability.  Time also spent counseling patient and her daughter about ways to possibly decrease caregiver burden reported by daughter.  Discussed possibly enlisting/hiring support for certain tasks, such as cleaning, meal prep or assistance with managing finances.  Discussed inquiring if there is a nearby church that would offer transportation for patient to attend services to allow caregiver to have a break and patient to meet spiritual and social needs.  Patient follow-up in 6 weeks or sooner if clinically indicated. Generalized anxiety disorder - Plan: DULoxetine (CYMBALTA) 30 MG capsule, ALPRAZolam (XANAX) 0.5 MG tablet  Mood disorder (HCC) - Plan: DULoxetine (CYMBALTA) 30 MG capsule  Please see After Visit Summary for patient specific instructions.  Future Appointments  Date Time Provider [place]  06/18/2018  3:00 PM [facility] 6 - PS [facility]  06/18/2018  4:00 PM [facility] - [facility]  06/25/2018  1:00 PM [facility] 4 - SS [facility]  06/25/2018  1:45 PM Serafina Mitchell, MD [facility]  07/03/2018  1:00 PM Thayer Headings, PMHNP [facility] None  10/26/2018  3:30 PM Cameron Sprang, MD [facility] None    No orders of the defined types were placed in this encounter.     -------------------------------

## 2018-05-25 ENCOUNTER — Other Ambulatory Visit: Payer: Self-pay

## 2018-05-25 DIAGNOSIS — F39 Unspecified mood [affective] disorder: Secondary | ICD-10-CM

## 2018-05-25 DIAGNOSIS — F411 Generalized anxiety disorder: Secondary | ICD-10-CM

## 2018-05-25 MED ORDER — DULOXETINE HCL 30 MG PO CPEP: 30.0000 mg | ORAL_CAPSULE | Freq: Every day | ORAL | 0 refills | Status: DC

## 2018-05-28 NOTE — Telephone Encounter (Signed)
I left a message for patient's daughter to call the office to reschedule cancelled appointments.

## 2018-06-05 NOTE — Telephone Encounter (Signed)
I called patient's daughter to try to reschedule missed appointments but call went straight to voicemail. I left a message asking that Deanna [name] call the office to reschedule cancelled appointments.  FYI to provider: this is the 3rd attempt to contact patient's daughter to reschedule.

## 2018-06-06 NOTE — Telephone Encounter (Signed)
Noted, thank you

## 2018-06-17 ENCOUNTER — Encounter: Payer: Self-pay | Admitting: Emergency Medicine

## 2018-06-17 DIAGNOSIS — F411 Generalized anxiety disorder: Secondary | ICD-10-CM | POA: Insufficient documentation

## 2018-06-17 DIAGNOSIS — F39 Unspecified mood [affective] disorder: Secondary | ICD-10-CM | POA: Insufficient documentation

## 2018-06-18 ENCOUNTER — Ambulatory Visit (INDEPENDENT_AMBULATORY_CARE_PROVIDER_SITE_OTHER)
Admission: RE | Admit: 2018-06-18 | Discharge: 2018-06-18 | Disposition: A | Discharge status: Home / Self Care | Payer: Medicare HMO | Source: Ambulatory Visit | Attending: Surgery | Admitting: Surgery

## 2018-06-18 ENCOUNTER — Ambulatory Visit (HOSPITAL_COMMUNITY)
Admission: RE | Admit: 2018-06-18 | Discharge: 2018-06-18 | Disposition: A | Discharge status: Home / Self Care | Payer: Medicare HMO | Source: Ambulatory Visit | Attending: Surgery | Admitting: Surgery

## 2018-06-18 DIAGNOSIS — I70213 Atherosclerosis of native arteries of extremities with intermittent claudication, bilateral legs: Secondary | ICD-10-CM | POA: Insufficient documentation

## 2018-06-18 DIAGNOSIS — I6523 Occlusion and stenosis of bilateral carotid arteries: Secondary | ICD-10-CM | POA: Diagnosis not present

## 2018-06-18 DIAGNOSIS — I779 Disorder of arteries and arterioles, unspecified: Secondary | ICD-10-CM | POA: Diagnosis not present

## 2018-06-21 DIAGNOSIS — N3946 Mixed incontinence: Secondary | ICD-10-CM | POA: Diagnosis not present

## 2018-06-21 DIAGNOSIS — R350 Frequency of micturition: Secondary | ICD-10-CM | POA: Diagnosis not present

## 2018-06-25 ENCOUNTER — Ambulatory Visit (HOSPITAL_COMMUNITY)
Admission: RE | Admit: 2018-06-25 | Discharge: 2018-06-25 | Disposition: A | Discharge status: Home / Self Care | Payer: Medicare HMO | Source: Ambulatory Visit | Attending: Surgery | Admitting: Surgery

## 2018-06-25 ENCOUNTER — Other Ambulatory Visit: Payer: Self-pay

## 2018-06-25 ENCOUNTER — Encounter: Payer: Self-pay | Admitting: Surgery

## 2018-06-25 ENCOUNTER — Ambulatory Visit: Payer: Medicare HMO | Admitting: Surgery

## 2018-06-25 VITALS — BP 172/76 | HR 80 | Resp 18 | Ht 60.0 in | Wt 211.7 lb

## 2018-06-25 DIAGNOSIS — I6523 Occlusion and stenosis of bilateral carotid arteries: Secondary | ICD-10-CM | POA: Diagnosis not present

## 2018-06-25 DIAGNOSIS — I779 Disorder of arteries and arterioles, unspecified: Secondary | ICD-10-CM | POA: Diagnosis not present

## 2018-06-25 DIAGNOSIS — I70213 Atherosclerosis of native arteries of extremities with intermittent claudication, bilateral legs: Secondary | ICD-10-CM | POA: Diagnosis not present

## 2018-06-25 DIAGNOSIS — I724 Aneurysm of artery of lower extremity: Secondary | ICD-10-CM

## 2018-06-25 NOTE — Progress Notes (Signed)
[facility]  Patient name: Deanna [name] MRN: [id number] DOB: 05/29/1941 Sex: female   REASON FOR VISIT:    Follow up  HISOTRY OF PRESENT ILLNESS:   Deanna [name] is a 77 y.o. 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 This was done in the setting of dizziness and left arm numbness. Her postoperative course was complicated by a seroma which became infected and required drainage. Ultimately she recovered   She has a history of a femoral-femoral bypass graft by Dr. Arlyce Dice.  She was found to have a mid graft pseudoaneurysm in 2017.  On 08-22-2017, she underwent drug-coated balloon angioplasty of the right limb of her femoral-femoral bypass graft for a 80% stenosis.  In addition, she underwent endovascular exclusion of her pseudoaneurysm within the mid femoral-femoral graft using a 8 x 50 Viabond and a 8 x 28 VBX.  She is having complaints with right knee pain.  She is also concerned about the tremor in both of her arms and a change in her hyper tension.  She does report taking a different antianxiety medicine and generic Depakote beginning 3 months ago.  PAST MEDICAL HISTORY:   Past Medical History:  Diagnosis Date  . AMI 11/17/2008   Qualifier: Diagnosis of  By: Marland Mcalpine    . Anxiety   . Atherosclerotic peripheral vascular disease ([place])   . Bacteremia, escherichia coli 04/27/2015  . Brachial-basilar insufficiency syndrome   . CAD (coronary artery disease)    followed by dr [name].  . Celiac artery stenosis ([place])   . CHF (congestive heart failure) ([place]) 11/14/2017  . Chronic bilateral low back pain without sciatica   . Cognitive impairment   . Colon polyps   . Community acquired pneumonia   . Depression   . Diverticulosis   . Dysuria   . Encephalomalacia   . GAD (generalized anxiety disorder)   . GERD (gastroesophageal reflux disease)   .  Glaucoma   . Hearing loss   . Hemorrhoids   . Hypertension   . Hypothyroidism   . Incontinence   . Mixed hyperlipidemia   . Myocardial infarction ([place])   . OSA on CPAP   . Peripheral arterial disease ([place])   . Pneumonitis 04/26/2015  . Pyelonephritis   . Pyelonephritis due to Escherichia coli 04/28/2015  . Sepsis ([place])   . Sinus drainage    took z-pack   finished yesterday  . Spondylosis of lumbar region without myelopathy or radiculopathy   . 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[place])   . Urticaria   . Vertigo, benign positional      FAMILY HISTORY:   Family History  Problem Relation Age of Onset  . Heart attack Mother   . Heart disease Mother        before age 61  . Diabetes Father   . Heart disease Father   . Hypertension Father   . Hyperlipidemia Father   . Heart attack Father 13  . Heart attack Brother 87  . Cerebral palsy Sister 39  . Congestive Heart Failure Sister 50  . Heart attack Sister 12  . Hypertension Sister 68  . Dementia Sister   . Anxiety disorder Sister   . Anxiety disorder Sister 78  . Heart Problems Sister   . Stroke Sister   . Heart Problems Sister 27  . Heart attack Sister 65  . Colon cancer Brother 50  . Prostate cancer  Brother 35  . Hypertension Daughter   . Irritable bowel syndrome Daughter   . Depression Daughter   . Anxiety disorder Daughter     SOCIAL HISTORY:   Social History   Tobacco Use  . Smoking status: Former Smoker    Years: 3.00    Types: Cigarettes    Last attempt to quit: 11/27/1981    Years since quitting: 36.6  . Smokeless tobacco: Never Used  Substance Use Topics  . Alcohol use: Not Currently    Alcohol/week: 0.0 standard drinks    Frequency: Never     ALLERGIES:   Allergies  Allergen Reactions  . Donepezil Other (See Comments)    Altered mood, aggression, and caused anger  . Hydrocodone-Acetaminophen Other (See Comments)    "Delirium, confusion, toxic dementia"  . Meloxicam     Pt suspects that this  medicine causes fluid on her lungs.   . Memantine Other (See Comments)    Caused severe aggression  . 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 Vicodin [Hydrocodone-Acetaminophen] Other (See Comments)    Delirium, confusion, and toxic dementia     CURRENT MEDICATIONS:   Current Outpatient Medications  Medication Sig Dispense Refill  . acetaminophen (TYLENOL) 325 MG tablet Take 325 mg by mouth every 6 (six) hours as needed (for pain).    Marland Kitchen ALPRAZolam (XANAX) 0.5 MG tablet Take 1 tablet (0.5 mg total) by mouth daily as needed for anxiety. 90 tablet 0  . amLODipine (NORVASC) 5 MG tablet Take 5 mg by mouth daily.    Marland Kitchen aspirin EC 81 MG tablet Take 81 mg by mouth at bedtime.     . cholecalciferol (VITAMIN D) 400 units TABS tablet Take 400 Units by mouth daily.    . clopidogrel (PLAVIX) 75 MG tablet Take 1 tablet (75 mg total) by mouth daily. 90 tablet 2  . divalproex (DEPAKOTE ER) 250 MG 24 hr tablet Take 1 tablet every morning 90 tablet 1  . divalproex (DEPAKOTE) 500 MG DR tablet Take 1 tablet (500 mg total) by mouth at bedtime. 90 tablet 1  . DULoxetine (CYMBALTA) 30 MG capsule Take 1 capsule (30 mg total) by mouth daily. 90 capsule 0  . furosemide (LASIX) 40 MG tablet Take 1 tablet (40 mg total) by mouth daily as needed. Take 1 tablet if you have more than a 2 pound weight gain in 24 hours as needed. 30 tablet 11  . latanoprost (XALATAN) 0.005 % ophthalmic solution Place 1 drop into both eyes at bedtime.    Marland Kitchen levothyroxine (SYNTHROID, LEVOTHROID) 200 MCG tablet Take 1 tablet (200 mcg total) by mouth daily before breakfast. 90 tablet 1  . meclizine (ANTIVERT) 25 MG tablet Take 25 mg by mouth 3 (three) times daily as needed for dizziness.    . metoprolol tartrate (LOPRESSOR) 25 MG tablet Take 1 tablet (25 mg total) by mouth 2 (two) times daily. (Patient taking differently: Take 12.5 mg by mouth 2 (two) times daily. ) 180 tablet 1  .  mirabegron ER (MYRBETRIQ) 25 MG TB24 tablet Take 25 mg by mouth daily.     . pantoprazole (PROTONIX) 40 MG tablet TAKE 1 TABLET BY MOUTH DAILY 30 tablet 0  . promethazine (PHENERGAN) 12.5 MG tablet Take 12.5 mg by mouth every 6 (six) hours as needed for nausea or vomiting.    . ranitidine (ZANTAC) 150 MG tablet Take 150 mg by mouth 2 (two) times daily.    Marland Kitchen  rosuvastatin (CRESTOR) 20 MG tablet Take 1 tablet (20 mg total) by mouth daily. 90 tablet 1  . Thiamine HCl (VITAMIN B-1) 250 MG tablet Take 250 mg by mouth daily.    Marland Kitchen tiZANidine (ZANAFLEX) 2 MG tablet Take 2 mg by mouth every 6 (six) hours as needed for muscle spasms.    . traMADol (ULTRAM) 50 MG tablet TAKE 1 TABLET EVERY 4 HOURS AS NEEDED FOR PAIN 30 tablet 0  . valsartan (DIOVAN) 80 MG tablet Take 1 tablet (80 mg total) by mouth every morning. 90 tablet 1   No current facility-administered medications for this visit.     REVIEW OF SYSTEMS:   [X]  denotes positive finding, [ ]  denotes negative finding Cardiac  Comments:  Chest pain or chest pressure:    Shortness of breath upon exertion:    Short of breath when lying flat:    Irregular heart rhythm:        Vascular    Pain in calf, thigh, or hip brought on by ambulation:    Pain in feet at night that wakes you up from your sleep:     Blood clot in your veins:    Leg swelling:         Pulmonary    Oxygen at home:    Productive cough:     Wheezing:         Neurologic    Sudden weakness in arms or legs:     Sudden numbness in arms or legs:     Sudden onset of difficulty speaking or slurred speech:    Temporary loss of vision in one eye:     Problems with dizziness:         Gastrointestinal    Blood in stool:     Vomited blood:         Genitourinary    Burning when urinating:     Blood in urine:        Psychiatric    Major depression:         Hematologic    Bleeding problems:    Problems with blood clotting too easily:        Skin    Rashes or ulcers:          Constitutional    Fever or chills:      PHYSICAL EXAM:   Vitals:   06/25/18 1403  BP: (!) 172/76  Pulse: 80  Resp: 18  SpO2: 94%  Weight: 211 lb 11.2 oz (96 kg)  Height: 5' (1.524 m)    GENERAL: The patient is a well-nourished female, in no acute distress. The vital signs are documented above. CARDIAC: There is a regular rate and rhythm.  VASCULAR: Palpable right radial pulse, nonpalpable left PULMONARY: Non-labored respirations MUSCULOSKELETAL: There are no major deformities or cyanosis. NEUROLOGIC: No focal weakness or paresthesias are detected. SKIN: There are no ulcers or rashes noted. PSYCHIATRIC: The patient has a normal affect.  STUDIES:   I reviewed her vascular studies with the following findings:  Left to RIght FFBPG: Patent femoral to femoral bypass graft with increased velocity in the left inflow and proximal anastomosis (limited visualization due to shadowing) and in the distal right anastamosis and outflow. These increased velocity may be due  to the sharp dive of the graft and outflow vessel.  Left Graft(s): Right CFA 123 cm/s triphasic waveform. Left CFA 132 broad biphasic waveform.  ABI: Right 0.88 Left 1.0 (06/18/2018)  MEDICAL ISSUES:   Carotid: Patient  will follow-up with surveillance in 6 months  Lower extremity: Surveillance ultrasound in 6 months.  Attention will need to be paid towards the inflow artery to her femoral-femoral graft.  I have encouraged her to reach out to her primary care physician regarding her acute change in hypertension as well as tremors in her arms which correlate with a change in medication.    Annamarie Major, MD [facility] [phone] Pager [phone]

## 2018-07-03 ENCOUNTER — Ambulatory Visit: Payer: Medicare HMO | Admitting: Psychiatry

## 2018-07-19 DIAGNOSIS — S0501XA Injury of conjunctiva and corneal abrasion without foreign body, right eye, initial encounter: Secondary | ICD-10-CM | POA: Diagnosis not present

## 2018-07-19 DIAGNOSIS — I10 Essential (primary) hypertension: Secondary | ICD-10-CM | POA: Diagnosis not present

## 2018-07-27 ENCOUNTER — Encounter: Payer: Self-pay | Admitting: Psychiatry

## 2018-07-27 ENCOUNTER — Ambulatory Visit: Payer: Medicare HMO | Admitting: Psychiatry

## 2018-07-27 VITALS — BP 165/72 | HR 98

## 2018-07-27 DIAGNOSIS — F411 Generalized anxiety disorder: Secondary | ICD-10-CM

## 2018-07-27 DIAGNOSIS — F39 Unspecified mood [affective] disorder: Secondary | ICD-10-CM | POA: Diagnosis not present

## 2018-07-27 DIAGNOSIS — R4182 Altered mental status, unspecified: Secondary | ICD-10-CM | POA: Diagnosis not present

## 2018-07-27 DIAGNOSIS — Z79899 Other long term (current) drug therapy: Secondary | ICD-10-CM | POA: Diagnosis not present

## 2018-07-27 MED ORDER — DULOXETINE HCL 20 MG PO CPEP: ORAL_CAPSULE | ORAL | 0 refills | Status: DC
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
## 2018-08-24 ENCOUNTER — Telehealth: Payer: Self-pay

## 2018-08-24 MED ORDER — DIVALPROEX SODIUM ER 250 MG PO TB24: ORAL_TABLET | ORAL | 2 refills | Status: DC

## 2018-08-24 NOTE — Telephone Encounter (Signed)
Received fax from [employer] for depakote 250 mg ER for 30 day supply. Sister confirms she uses Depakote rx's at [facility] but everything else goes to [place] 08/28/2018

## 2018-08-28 ENCOUNTER — Ambulatory Visit: Payer: Medicare HMO | Admitting: Psychiatry

## 2018-08-28 ENCOUNTER — Encounter: Payer: Self-pay | Admitting: Psychiatry

## 2018-08-28 VITALS — BP 95/48 | HR 78

## 2018-08-28 DIAGNOSIS — F411 Generalized anxiety disorder: Secondary | ICD-10-CM

## 2018-08-28 DIAGNOSIS — F39 Unspecified mood [affective] disorder: Secondary | ICD-10-CM

## 2018-08-28 MED ORDER — ESCITALOPRAM OXALATE 10 MG PO TABS: ORAL_TABLET | ORAL | 1 refills | Status: DC

## 2018-08-28 NOTE — Progress Notes (Signed)
Deanna [name] [id number] July 29, 1941 78 y.o.  Subjective:   Patient ID:  Deanna [name] is a 78 y.o. (DOB 24-Jan-1941) female.  Chief Complaint:  Chief Complaint  Patient presents with  . Depression  . Anxiety    HPI Deanna [name] presents to the office today for follow-up of depression, anxiety, and irritability. Daughter is present on exam. Daughter reports that pt's cognition improved with coming off of Cymbalta and is having less difficulty finding her words. Daughter reports that pt seems to be more depressed. Daughter reports that she has been getting up and doing some activities, such as cooking. Daughter reports "she is showing more initiative."Daughter reports that pt seemed "more content" in the past.   They report that there have been multiple recent stressors with different things breaking and not working. Pt reports that her mood has been more depressed and irritable recently. Daughter reports that pt seldom takes Alprazolam prn. Pt reports reports that she has had "a little" anxiety.  Energy and motivation have been better than it has been in a while. She reports that she is sleeping well. Appetite has been good. She reports that her concentration is "a lot better." Reports increased interest in things, such as learning new information and playing games. Denies SI.   Reports that they were unable to get labs scheduled at PCP.  Past Psychiatric Medication Trials: Sertraline- initially helpful and then no longer effective Cymbalta- increased blood pressure Prozac-possible adverse reaction Depakote Ativan-effective but caused excessive drowsiness Xanax-effective Aricept-anger Namenda-anger Review of Systems:  Review of Systems  Musculoskeletal: Positive for arthralgias, back pain and neck pain. Negative for gait problem.  Neurological: Positive for tremors.       Reports improved tremor  Psychiatric/Behavioral:       Please refer to HPI    Medications: I have  reviewed the patient's current medications.  Current Outpatient Medications  Medication Sig Dispense Refill  . acetaminophen (TYLENOL) 325 MG tablet Take 325 mg by mouth every 6 (six) hours as needed (for pain).    Marland Kitchen ALPRAZolam (XANAX) 0.5 MG tablet Take 1 tablet (0.5 mg total) by mouth daily as needed for anxiety. 90 tablet 0  . amLODipine (NORVASC) 5 MG tablet Take 5 mg by mouth daily.    Marland Kitchen aspirin EC 81 MG tablet Take 81 mg by mouth at bedtime.     . cholecalciferol (VITAMIN D) 400 units TABS tablet Take 400 Units by mouth daily.    . clopidogrel (PLAVIX) 75 MG tablet Take 1 tablet (75 mg total) by mouth daily. 90 tablet 2  . divalproex (DEPAKOTE ER) 250 MG 24 hr tablet Take 1 tablet by mouth every morning and 2 tablets every night at bedtime. 90 tablet 2  . furosemide (LASIX) 40 MG tablet Take 1 tablet (40 mg total) by mouth daily as needed. Take 1 tablet if you have more than a 2 pound weight gain in 24 hours as needed. 30 tablet 11  . latanoprost (XALATAN) 0.005 % ophthalmic solution Place 1 drop into both eyes at bedtime.    Marland Kitchen levothyroxine (SYNTHROID, LEVOTHROID) 200 MCG tablet Take 1 tablet (200 mcg total) by mouth daily before breakfast. 90 tablet 1  . meclizine (ANTIVERT) 25 MG tablet Take 25 mg by mouth 3 (three) times daily as needed for dizziness.    . metoprolol tartrate (LOPRESSOR) 25 MG tablet Take 1 tablet (25 mg total) by mouth 2 (two) times daily. (Patient taking differently: Take 12.5 mg by mouth 2 (  two) times daily. ) 180 tablet 1  . mirabegron ER (MYRBETRIQ) 25 MG TB24 tablet Take 25 mg by mouth daily.     . pantoprazole (PROTONIX) 40 MG tablet TAKE 1 TABLET BY MOUTH DAILY 30 tablet 0  . promethazine (PHENERGAN) 12.5 MG tablet Take 12.5 mg by mouth every 6 (six) hours as needed for nausea or vomiting.    . rosuvastatin (CRESTOR) 20 MG tablet Take 1 tablet (20 mg total) by mouth daily. 90 tablet 1  . tiZANidine (ZANAFLEX) 2 MG tablet Take 2 mg by mouth every 6 (six) hours as  needed for muscle spasms.    . traMADol (ULTRAM) 50 MG tablet TAKE 1 TABLET EVERY 4 HOURS AS NEEDED FOR PAIN 30 tablet 0  . valsartan (DIOVAN) 80 MG tablet Take 1 tablet (80 mg total) by mouth every morning. 90 tablet 1  . escitalopram (LEXAPRO) 10 MG tablet Take 1/2 tab po qd x 1 week, then increase to 1 tab po qd 30 tablet 1  . ranitidine (ZANTAC) 150 MG tablet Take 150 mg by mouth 2 (two) times daily.    . Thiamine HCl (VITAMIN B-1) 250 MG tablet Take 250 mg by mouth daily.     No current facility-administered medications for this visit.     Medication Side Effects: None  Allergies:  Allergies  Allergen Reactions  . Donepezil Other (See Comments)    Altered mood, aggression, and caused anger  . Hydrocodone-Acetaminophen Other (See Comments)    "Delirium, confusion, toxic dementia"  . Meloxicam     Pt suspects that this medicine causes fluid on her lungs.   . Memantine Other (See Comments)    Caused severe aggression  . 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 Vicodin [Hydrocodone-Acetaminophen] Other (See Comments)    Delirium, confusion, and toxic dementia    Past Medical History:  Diagnosis Date  . AMI 11/17/2008   Qualifier: Diagnosis of  By: Marland Mcalpine    . Anxiety   . Atherosclerotic peripheral vascular disease ([place])   . Bacteremia, escherichia coli 04/27/2015  . Brachial-basilar insufficiency syndrome   . CAD (coronary artery disease)    followed by dr [name].  . Celiac artery stenosis ([place])   . CHF (congestive heart failure) ([place]) 11/14/2017  . Chronic bilateral low back pain without sciatica   . Cognitive impairment   . Colon polyps   . Community acquired pneumonia   . Depression   . Diverticulosis   . Dysuria   . Encephalomalacia   . GAD (generalized anxiety disorder)   . GERD (gastroesophageal reflux disease)   . Glaucoma   . Hearing loss   . Hemorrhoids   . Hypertension   . Hypothyroidism    . Incontinence   . Mixed hyperlipidemia   . Myocardial infarction ([place])   . OSA on CPAP   . Peripheral arterial disease ([place])   . Pneumonitis 04/26/2015  . Pyelonephritis   . Pyelonephritis due to Escherichia coli 04/28/2015  . Sepsis ([place])   . Sinus drainage    took z-pack   finished yesterday  . Spondylosis of lumbar region without myelopathy or radiculopathy   . 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[place])   . Urticaria   . Vertigo, benign positional     Family History  Problem Relation Age of Onset  . Heart attack Mother   . Heart disease Mother        before  age 79  . Diabetes Father   . Heart disease Father   . Hypertension Father   . Hyperlipidemia Father   . Heart attack Father 97  . Heart attack Brother 20  . Cerebral palsy Sister 65  . Congestive Heart Failure Sister 36  . Heart attack Sister 23  . Hypertension Sister 17  . Dementia Sister   . Anxiety disorder Sister   . Anxiety disorder Sister 59  . Heart Problems Sister   . Stroke Sister   . Heart Problems Sister 55  . Heart attack Sister 66  . Colon cancer Brother 63  . Prostate cancer Brother 45  . Hypertension Daughter   . Irritable bowel syndrome Daughter   . Depression Daughter   . Anxiety disorder Daughter     Social History   Socioeconomic History  . Marital status: Divorced    Spouse name: Not on file  . Number of children: Not on file  . Years of education: Not on file  . Highest education level: Not on file  Occupational History  . Not on file  Social Needs  . Financial resource strain: Not on file  . Food insecurity:    Worry: Not on file    Inability: Not on file  . Transportation needs:    Medical: Not on file    Non-medical: Not on file  Tobacco Use  . Smoking status: Former Smoker    Years: 3.00    Types: Cigarettes    Last attempt to quit: 11/27/1981    Years since quitting: 36.7  . Smokeless tobacco: Never Used  Substance and Sexual Activity  . Alcohol use: Not Currently     Alcohol/week: 0.0 standard drinks    Frequency: Never  . Drug use: No  . Sexual activity: Not Currently    Comment: 1st intercourse 58 yo-1 partner  Lifestyle  . Physical activity:    Days per week: Not on file    Minutes per session: Not on file  . Stress: Not on file  Relationships  . Social connections:    Talks on phone: Not on file    Gets together: Not on file    Attends religious service: Not on file    Active member of club or organization: Not on file    Attends meetings of clubs or organizations: Not on file    Relationship status: Not on file  . Intimate partner violence:    Fear of current or ex partner: Not on file    Emotionally abused: Not on file    Physically abused: Not on file    Forced sexual activity: Not on file  Other Topics Concern  . Not on file  Social History Narrative   Social History      Diet? Healthy but too many sweets      Do you drink/eat things with caffeine? Yes occasionally      Marital status?                    D                What year were you married?      Do you live in a house, apartment, assisted living, condo, trailer, etc.? condo      Is it one or more stories? 1      How many persons live in your home? 2      Do you have any pets in your home? (please  list) 1 cat      Highest level of education completed? 1 year college      Current or past profession: housewife      Do you exercise?           no                           Type & how often?      Advanced Directives      Do you have a living will? no      Do you have a DNR form?             no                     If not, do you want to discuss one? yes      Do you have signed POA/HPOA for forms? no      Functional Status      Do you have difficulty bathing or dressing yourself? No- gets tired      Do you have difficulty preparing food or eating? No- eats frozen entrees or poor nutrition meals      Do you have difficulty managing your medications? yes       Do you have difficulty managing your finances? yes      Do you have difficulty affording your medications? No- funds running low    Past Medical History, Surgical history, Social history, and Family history were reviewed and updated as appropriate.   Please see review of systems for further details on the patient's review from today.   Objective:   Physical Exam:  BP (!) 95/48   Pulse 78   Physical Exam Constitutional:      General: She is not in acute distress.    Appearance: She is well-developed.  Musculoskeletal:        General: No deformity.  Neurological:     Mental Status: She is alert and oriented to person, place, and time.     Coordination: Coordination normal.  Psychiatric:        Mood and Affect: Mood is anxious and depressed. Affect is not labile, blunt, angry or inappropriate.        Speech: Speech normal.        Behavior: Behavior normal.        Thought Content: Thought content normal. Thought content does not include homicidal or suicidal ideation. Thought content does not include homicidal or suicidal plan.        Judgment: Judgment normal.     Comments: Insight intact. No auditory or visual hallucinations. No delusions.      Lab Review:     Component Value Date/Time   NA 137 12/28/2017 1045   K 4.6 12/28/2017 1045   CL 103 12/28/2017 1045   CO2 25 12/28/2017 1045   GLUCOSE 101 (H) 12/28/2017 1045   BUN 18 12/28/2017 1045   CREATININE 0.96 (H) 12/28/2017 1045   CALCIUM 9.4 12/28/2017 1045   PROT 6.9 12/28/2017 1045   ALBUMIN 4.6 12/18/2015 1000   AST 17 12/28/2017 1045   ALT 14 12/28/2017 1045   ALKPHOS 81 12/18/2015 1000   BILITOT 0.9 12/28/2017 1045   GFRNONAA 57 (L) 12/28/2017 1045   GFRAA 67 12/28/2017 1045       Component Value Date/Time   WBC 7.4 11/15/2017 0417   RBC 4.94 11/15/2017 0417   HGB 13.8 11/15/2017 0417   HCT  42.4 11/15/2017 0417   PLT 133 (L) 11/15/2017 0417   MCV 85.8 11/15/2017 0417   MCH 27.9 11/15/2017 0417    MCHC 32.5 11/15/2017 0417   RDW 13.3 11/15/2017 0417   LYMPHSABS 0.9 04/27/2015 0310   MONOABS 0.5 04/27/2015 0310   EOSABS 0.0 04/27/2015 0310   BASOSABS 0.0 04/27/2015 0310    No results found for: POCLITH, LITHIUM   No results found for: PHENYTOIN, PHENOBARB, VALPROATE, CBMZ   .res Assessment: Plan:   Discussed potential benefits, risks, or side effects of Lexapro with pt and her daughter. Advised them to contact office if pt experiences any mental status changes with Lexapro. Will start Lexapro 10 mg 1/2 tab po qd x 1 week, then 1 tab po qd for depression and anxiety.  Continue Depakote 250 mg po q am and 500 mg po QHS for mood stabilization.  Continue Xanax 0.5 mg po qd prn anxiety.  Mood disorder ([place]) - Plan: escitalopram (LEXAPRO) 10 MG tablet  Generalized anxiety disorder - Plan: escitalopram (LEXAPRO) 10 MG tablet  Please see After Visit Summary for patient specific instructions.  Future Appointments  Date Time Provider [place]  10/09/2018 12:45 PM Thayer Headings, PMHNP [facility] None  10/26/2018  3:30 PM Cameron Sprang, MD [facility] None    No orders of the defined types were placed in this encounter.     -------------------------------

## 2018-09-13 ENCOUNTER — Other Ambulatory Visit: Payer: Self-pay | Admitting: Psychiatry

## 2018-10-09 ENCOUNTER — Ambulatory Visit: Payer: Medicare HMO | Admitting: Psychiatry

## 2018-10-22 ENCOUNTER — Other Ambulatory Visit: Payer: Self-pay | Admitting: Internal Medicine

## 2018-10-23 NOTE — Telephone Encounter (Signed)
Last seen 12/24/17, no future appts, all other appts canceled, refilled denied until appt is made

## 2018-10-26 ENCOUNTER — Encounter

## 2018-10-26 ENCOUNTER — Ambulatory Visit: Payer: Medicare HMO | Admitting: Neurology

## 2018-10-30 ENCOUNTER — Other Ambulatory Visit: Payer: Self-pay

## 2018-10-30 ENCOUNTER — Other Ambulatory Visit: Payer: Self-pay | Admitting: Nurse Practitioner

## 2018-10-30 DIAGNOSIS — E785 Hyperlipidemia, unspecified: Secondary | ICD-10-CM

## 2018-10-30 MED ORDER — ROSUVASTATIN CALCIUM 20 MG PO TABS: 20.0000 mg | ORAL_TABLET | Freq: Every day | ORAL | 0 refills | Status: DC

## 2018-10-30 MED ORDER — LEVOTHYROXINE SODIUM 200 MCG PO TABS: 200.0000 ug | ORAL_TABLET | Freq: Every day | ORAL | 0 refills | Status: DC

## 2018-10-30 NOTE — Telephone Encounter (Signed)
Received refill request for Rosuvastatin,Deanna [name] is aware and states patient can have a refill for 30 days but need to schedule to be seen. Patient is aware that a 30 day supply will be sent to her pharmacy and an appointment has been scheduled

## 2018-10-31 ENCOUNTER — Telehealth: Payer: Self-pay | Admitting: Psychiatry

## 2018-11-01 ENCOUNTER — Other Ambulatory Visit: Payer: Self-pay

## 2018-11-01 ENCOUNTER — Other Ambulatory Visit: Payer: Self-pay | Admitting: Nurse Practitioner

## 2018-11-01 ENCOUNTER — Ambulatory Visit (INDEPENDENT_AMBULATORY_CARE_PROVIDER_SITE_OTHER): Payer: Medicare HMO | Admitting: Psychiatry

## 2018-11-01 DIAGNOSIS — E785 Hyperlipidemia, unspecified: Secondary | ICD-10-CM

## 2018-11-01 DIAGNOSIS — F411 Generalized anxiety disorder: Secondary | ICD-10-CM | POA: Diagnosis not present

## 2018-11-01 DIAGNOSIS — F39 Unspecified mood [affective] disorder: Secondary | ICD-10-CM | POA: Diagnosis not present

## 2018-11-01 MED ORDER — ESCITALOPRAM OXALATE 10 MG PO TABS: 10.0000 mg | ORAL_TABLET | Freq: Every day | ORAL | 1 refills | Status: DC

## 2018-11-01 MED ORDER — ROSUVASTATIN CALCIUM 20 MG PO TABS: 20.0000 mg | ORAL_TABLET | Freq: Every day | ORAL | 0 refills | Status: DC

## 2018-11-01 MED ORDER — ALPRAZOLAM 0.5 MG PO TABS: 0.5000 mg | ORAL_TABLET | Freq: Every day | ORAL | 0 refills | Status: DC | PRN

## 2018-11-01 MED ORDER — DIVALPROEX SODIUM ER 250 MG PO TB24: ORAL_TABLET | ORAL | 2 refills | Status: DC

## 2018-11-01 NOTE — Progress Notes (Signed)
LYNSEE WANDS [id number] August 31, 1940 78 y.o.  Subjective:   Patient ID:  Deanna [name] is a 78 y.o. (DOB 19-Jul-1941) female.  Chief Complaint:  Chief Complaint  Patient presents with  . Follow-up    h/o Anxiety, Depression   Virtual Visit via Telephone Note  I connected with pt by telephone and verified that I am speaking with the correct person using two identifiers.   I discussed the limitations, risks, security and privacy concerns of performing an evaluation and management service by telephone and the availability of in person appointments. I also discussed with the patient that there may be a patient responsible charge related to this service. The patient expressed understanding and agreed to proceed.  I discussed the assessment and treatment plan with the patient. The patient was provided an opportunity to ask questions and all were answered. The patient agreed with the plan and demonstrated an understanding of the instructions.   The patient was advised to call back or seek an in-person evaluation if the symptoms worsen or if the condition fails to improve as anticipated.  I provided 20 minutes of non-face-to-face time during this encounter. The call started at 12:45 pm and ended at 1:05. The patient was located at her home and the provider was located [place].  HPI VAIL BASISTA presents for televisit for follow-up of depression and anxiety.  She reports "everything is going well." She reports that her anxiety has been "good, considering what we are going through." They report that they have not been out to be able to determine her anxiety with outings since they have been staying home due to COVID 19. She reports "I get depressed every now and then but not like I used to." She denies irritability. She reports "I'm still a little sluggish." Reports that her activity is limited due to back pain and social distancing. She reports that her motivation has improved  slightly. "Not fully motivated yet, but much better." She reports that she has been sleeping well and denies sleep disturbance. Reports that her appetite has been good. She reports that her concenttation is adequate. Denies SI.    Past Psychiatric Medication Trials: Sertraline- initially helpful and then no longer effective Cymbalta- increased blood pressure Prozac-possible adverse reaction Lexapro Depakote Ativan-effective but caused excessive drowsiness Xanax-effective Aricept-anger Namenda- anger   Review of Systems:  Review of Systems  Musculoskeletal: Positive for back pain.  Neurological: Positive for weakness and numbness. Negative for tremors.  Psychiatric/Behavioral: Negative for dysphoric mood, sleep disturbance and suicidal ideas.   She reports that her tremor seemed to improve and in the last week her left arm "is going totally dead" and pain in her fingers upon awakening. Describes numbness in left upper extremity. Reports that numbness resolves after awakening.    Medications: I have reviewed the patient's current medications.  Current Outpatient Medications  Medication Sig Dispense Refill  . acetaminophen (TYLENOL) 325 MG tablet Take 325 mg by mouth every 6 (six) hours as needed (for pain).    Marland Kitchen ALPRAZolam (XANAX) 0.5 MG tablet Take 1 tablet (0.5 mg total) by mouth daily as needed for anxiety. 90 tablet 0  . amLODipine (NORVASC) 5 MG tablet Take 5 mg by mouth daily.    Marland Kitchen aspirin EC 81 MG tablet Take 81 mg by mouth at bedtime.     . cholecalciferol (VITAMIN D) 400 units TABS tablet Take 400 Units by mouth daily.    . clopidogrel (PLAVIX) 75 MG tablet Take 1 tablet (  75 mg total) by mouth daily. 90 tablet 2  . divalproex (DEPAKOTE ER) 250 MG 24 hr tablet Take 1 tablet by mouth every morning and 2 tablets every night at bedtime. 90 tablet 2  . escitalopram (LEXAPRO) 10 MG tablet Take 1 tablet (10 mg total) by mouth daily. 90 tablet 1  . furosemide (LASIX) 40 MG tablet  Take 1 tablet (40 mg total) by mouth daily as needed. Take 1 tablet if you have more than a 2 pound weight gain in 24 hours as needed. 30 tablet 11  . latanoprost (XALATAN) 0.005 % ophthalmic solution Place 1 drop into both eyes at bedtime.    Marland Kitchen levothyroxine (SYNTHROID, LEVOTHROID) 200 MCG tablet TAKE 1 TABLET(200 MCG) BY MOUTH DAILY BEFORE BREAKFAST 90 tablet 1  . meclizine (ANTIVERT) 25 MG tablet Take 25 mg by mouth 3 (three) times daily as needed for dizziness.    . metoprolol tartrate (LOPRESSOR) 25 MG tablet Take 1 tablet (25 mg total) by mouth 2 (two) times daily. (Patient taking differently: Take 12.5 mg by mouth 2 (two) times daily. ) 180 tablet 1  . mirabegron ER (MYRBETRIQ) 25 MG TB24 tablet Take 25 mg by mouth daily.     . pantoprazole (PROTONIX) 40 MG tablet TAKE 1 TABLET BY MOUTH DAILY 30 tablet 0  . promethazine (PHENERGAN) 12.5 MG tablet Take 12.5 mg by mouth every 6 (six) hours as needed for nausea or vomiting.    . ranitidine (ZANTAC) 150 MG tablet Take 150 mg by mouth 2 (two) times daily.    . rosuvastatin (CRESTOR) 20 MG tablet TAKE 1 TABLET(20 MG) BY MOUTH DAILY 90 tablet 0  . Thiamine HCl (VITAMIN B-1) 250 MG tablet Take 250 mg by mouth daily.    Marland Kitchen tiZANidine (ZANAFLEX) 2 MG tablet Take 2 mg by mouth every 6 (six) hours as needed for muscle spasms.    . traMADol (ULTRAM) 50 MG tablet TAKE 1 TABLET EVERY 4 HOURS AS NEEDED FOR PAIN 30 tablet 0  . valsartan (DIOVAN) 80 MG tablet Take 1 tablet (80 mg total) by mouth every morning. 90 tablet 1   No current facility-administered medications for this visit.     Medication Side Effects: None  Allergies:  Allergies  Allergen Reactions  . Donepezil Other (See Comments)    Altered mood, aggression, and caused anger  . Hydrocodone-Acetaminophen Other (See Comments)    "Delirium, confusion, toxic dementia"  . Meloxicam     Pt suspects that this medicine causes fluid on her lungs.   . Memantine Other (See Comments)    Caused  severe aggression  . 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 Vicodin [Hydrocodone-Acetaminophen] Other (See Comments)    Delirium, confusion, and toxic dementia    Past Medical History:  Diagnosis Date  . AMI 11/17/2008   Qualifier: Diagnosis of  By: Marland Mcalpine    . Anxiety   . Atherosclerotic peripheral vascular disease ([place])   . Bacteremia, escherichia coli 04/27/2015  . Brachial-basilar insufficiency syndrome   . CAD (coronary artery disease)    followed by dr [name].  . Celiac artery stenosis ([place])   . CHF (congestive heart failure) ([place]) 11/14/2017  . Chronic bilateral low back pain without sciatica   . Cognitive impairment   . Colon polyps   . Community acquired pneumonia   . Depression   . Diverticulosis   . Dysuria   . Encephalomalacia   .  GAD (generalized anxiety disorder)   . GERD (gastroesophageal reflux disease)   . Glaucoma   . Hearing loss   . Hemorrhoids   . Hypertension   . Hypothyroidism   . Incontinence   . Mixed hyperlipidemia   . Myocardial infarction ([place])   . OSA on CPAP   . Peripheral arterial disease ([place])   . Pneumonitis 04/26/2015  . Pyelonephritis   . Pyelonephritis due to Escherichia coli 04/28/2015  . Sepsis ([place])   . Sinus drainage    took z-pack   finished yesterday  . Spondylosis of lumbar region without myelopathy or radiculopathy   . 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[place])   . Urticaria   . Vertigo, benign positional     Family History  Problem Relation Age of Onset  . Heart attack Mother   . Heart disease Mother        before age 43  . Diabetes Father   . Heart disease Father   . Hypertension Father   . Hyperlipidemia Father   . Heart attack Father 3  . Heart attack Brother 78  . Cerebral palsy Sister 61  . Congestive Heart Failure Sister 106  . Heart attack Sister 49  . Hypertension Sister 56  . Dementia Sister   . Anxiety disorder Sister   . Anxiety disorder  Sister 84  . Heart Problems Sister   . Stroke Sister   . Heart Problems Sister 68  . Heart attack Sister 85  . Colon cancer Brother 28  . Prostate cancer Brother 56  . Hypertension Daughter   . Irritable bowel syndrome Daughter   . Depression Daughter   . Anxiety disorder Daughter     Social History   Socioeconomic History  . Marital status: Divorced    Spouse name: Not on file  . Number of children: Not on file  . Years of education: Not on file  . Highest education level: Not on file  Occupational History  . Not on file  Social Needs  . Financial resource strain: Not on file  . Food insecurity:    Worry: Not on file    Inability: Not on file  . Transportation needs:    Medical: Not on file    Non-medical: Not on file  Tobacco Use  . Smoking status: Former Smoker    Years: 3.00    Types: Cigarettes    Last attempt to quit: 11/27/1981    Years since quitting: 36.9  . Smokeless tobacco: Never Used  Substance and Sexual Activity  . Alcohol use: Not Currently    Alcohol/week: 0.0 standard drinks    Frequency: Never  . Drug use: No  . Sexual activity: Not Currently    Comment: 1st intercourse 60 yo-1 partner  Lifestyle  . Physical activity:    Days per week: Not on file    Minutes per session: Not on file  . Stress: Not on file  Relationships  . Social connections:    Talks on phone: Not on file    Gets together: Not on file    Attends religious service: Not on file    Active member of club or organization: Not on file    Attends meetings of clubs or organizations: Not on file    Relationship status: Not on file  . Intimate partner violence:    Fear of current or ex partner: Not on file    Emotionally abused: Not on file    Physically abused: Not on file  Forced sexual activity: Not on file  Other Topics Concern  . Not on file  Social History Narrative   Social History      Diet? Healthy but too many sweets      Do you drink/eat things with caffeine?  Yes occasionally      Marital status?                    D                What year were you married?      Do you live in a house, apartment, assisted living, condo, trailer, etc.? condo      Is it one or more stories? 1      How many persons live in your home? 2      Do you have any pets in your home? (please list) 1 cat      Highest level of education completed? 1 year college      Current or past profession: housewife      Do you exercise?           no                           Type & how often?      Advanced Directives      Do you have a living will? no      Do you have a DNR form?             no                     If not, do you want to discuss one? yes      Do you have signed POA/HPOA for forms? no      Functional Status      Do you have difficulty bathing or dressing yourself? No- gets tired      Do you have difficulty preparing food or eating? No- eats frozen entrees or poor nutrition meals      Do you have difficulty managing your medications? yes      Do you have difficulty managing your finances? yes      Do you have difficulty affording your medications? No- funds running low    Past Medical History, Surgical history, Social history, and Family history were reviewed and updated as appropriate.   Please see review of systems for further details on the patient's review from today.   Objective:   Physical Exam:  There were no vitals taken for this visit.  Physical Exam Neurological:     Mental Status: She is alert and oriented to person, place, and time.     Cranial Nerves: No dysarthria.  Psychiatric:        Attention and Perception: Attention normal.        Mood and Affect: Mood normal.        Speech: Speech normal.        Behavior: Behavior is cooperative.        Thought Content: Thought content normal. Thought content is not paranoid or delusional. Thought content does not include homicidal or suicidal ideation. Thought content does not include  homicidal or suicidal plan.        Cognition and Memory: Cognition is impaired. Memory is impaired.     Comments: Judgment is fair.      Lab Review:     Component Value Date/Time  NA 137 12/28/2017 1045   K 4.6 12/28/2017 1045   CL 103 12/28/2017 1045   CO2 25 12/28/2017 1045   GLUCOSE 101 (H) 12/28/2017 1045   BUN 18 12/28/2017 1045   CREATININE 0.96 (H) 12/28/2017 1045   CALCIUM 9.4 12/28/2017 1045   PROT 6.9 12/28/2017 1045   ALBUMIN 4.6 12/18/2015 1000   AST 17 12/28/2017 1045   ALT 14 12/28/2017 1045   ALKPHOS 81 12/18/2015 1000   BILITOT 0.9 12/28/2017 1045   GFRNONAA 57 (L) 12/28/2017 1045   GFRAA 67 12/28/2017 1045       Component Value Date/Time   WBC 7.4 11/15/2017 0417   RBC 4.94 11/15/2017 0417   HGB 13.8 11/15/2017 0417   HCT 42.4 11/15/2017 0417   PLT 133 (L) 11/15/2017 0417   MCV 85.8 11/15/2017 0417   MCH 27.9 11/15/2017 0417   MCHC 32.5 11/15/2017 0417   RDW 13.3 11/15/2017 0417   LYMPHSABS 0.9 04/27/2015 0310   MONOABS 0.5 04/27/2015 0310   EOSABS 0.0 04/27/2015 0310   BASOSABS 0.0 04/27/2015 0310    No results found for: POCLITH, LITHIUM   No results found for: PHENYTOIN, PHENOBARB, VALPROATE, CBMZ   .res Assessment: Plan:   Recommended f/u with PCP re: arm weakness and tingling.   Both pt and her daughter report that pt's mood and anxiety have improved with Lexapro 10 mg po qd without any tolerability issues.Will therefore continue Lexapro 10 mg po qd for depression and anxiety.  Will continue Depakote ER 250 mg po q am and 500 mg po QHS for mood stabilization. Pt and daughter report that she has an upcoming physical exam. Will review lab results from physical exam at next visit to monitor for possible adverse effects.  Will continue Xanax prn anxiety.   Patient advised to contact office with any questions, adverse effects, or acute worsening in signs and symptoms.   Mood disorder ([place]) - Plan: escitalopram (LEXAPRO) 10 MG tablet,  divalproex (DEPAKOTE ER) 250 MG 24 hr tablet  Generalized anxiety disorder - Plan: escitalopram (LEXAPRO) 10 MG tablet, ALPRAZolam (XANAX) 0.5 MG tablet  Please see After Visit Summary for patient specific instructions.  Future Appointments  Date Time Provider [place]  11/06/2018  3:00 PM Lorretta Harp, MD [facility]  11/19/2018  1:00 PM Cameron Sprang, MD [facility] None  11/30/2018 10:30 AM Lauree Chandler, NP [facility] None  01/31/2019  3:45 PM Thayer Headings, PMHNP [facility] None    No orders of the defined types were placed in this encounter.     -------------------------------

## 2018-11-01 NOTE — Telephone Encounter (Signed)
Medication was not sent to the pharmarcy

## 2018-11-03 ENCOUNTER — Encounter: Payer: Self-pay | Admitting: Psychiatry

## 2018-11-05 ENCOUNTER — Other Ambulatory Visit: Payer: Self-pay | Admitting: *Deleted

## 2018-11-05 ENCOUNTER — Other Ambulatory Visit: Payer: Self-pay | Admitting: Nurse Practitioner

## 2018-11-05 MED ORDER — VALSARTAN 80 MG PO TABS: 80.0000 mg | ORAL_TABLET | Freq: Every morning | ORAL | 0 refills | Status: DC

## 2018-11-05 NOTE — Telephone Encounter (Signed)
Patient called and stated that she needs a refill on her Valsartan. Stated that she has taken medication for years.   Patient has not been seen since 12/2017. Has an upcoming appointment scheduled for 11/30/2018.  Pended Rx and sent to [facility] for approval.

## 2018-11-05 NOTE — Telephone Encounter (Signed)
Wants sent to local pharmacy not mail order. Resent.

## 2018-11-05 NOTE — Addendum Note (Signed)
Addended by: Rafael Bihari A on: 11/05/2018 02:28 PM   Modules accepted: Orders

## 2018-11-06 ENCOUNTER — Ambulatory Visit: Payer: Medicare HMO | Admitting: Cardiovascular Disease

## 2018-11-19 ENCOUNTER — Ambulatory Visit: Payer: Medicare HMO | Admitting: Neurology

## 2018-11-19 ENCOUNTER — Other Ambulatory Visit: Payer: Self-pay

## 2018-11-20 ENCOUNTER — Telehealth (INDEPENDENT_AMBULATORY_CARE_PROVIDER_SITE_OTHER): Payer: Medicare HMO | Admitting: Neurology

## 2018-11-20 DIAGNOSIS — G3184 Mild cognitive impairment, so stated: Secondary | ICD-10-CM | POA: Diagnosis not present

## 2018-11-20 DIAGNOSIS — Z87820 Personal history of traumatic brain injury: Secondary | ICD-10-CM
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
## 2018-11-30 ENCOUNTER — Encounter: Payer: Self-pay | Admitting: Nurse Practitioner

## 2018-11-30 ENCOUNTER — Other Ambulatory Visit: Payer: Self-pay

## 2018-11-30 ENCOUNTER — Ambulatory Visit (INDEPENDENT_AMBULATORY_CARE_PROVIDER_SITE_OTHER): Payer: Medicare HMO | Admitting: Nurse Practitioner

## 2018-11-30 DIAGNOSIS — G4733 Obstructive sleep apnea (adult) (pediatric): Secondary | ICD-10-CM

## 2018-11-30 DIAGNOSIS — E785 Hyperlipidemia, unspecified: Secondary | ICD-10-CM | POA: Diagnosis not present

## 2018-11-30 DIAGNOSIS — G8929 Other chronic pain: Secondary | ICD-10-CM

## 2018-11-30 DIAGNOSIS — F39 Unspecified mood [affective] disorder: Secondary | ICD-10-CM

## 2018-11-30 DIAGNOSIS — I2510 Atherosclerotic heart disease of native coronary artery without angina pectoris: Secondary | ICD-10-CM

## 2018-11-30 DIAGNOSIS — E039 Hypothyroidism, unspecified: Secondary | ICD-10-CM | POA: Diagnosis not present

## 2018-11-30 DIAGNOSIS — Z9989 Dependence on other enabling machines and devices: Secondary | ICD-10-CM

## 2018-11-30 DIAGNOSIS — K219 Gastro-esophageal reflux disease without esophagitis: Secondary | ICD-10-CM

## 2018-11-30 DIAGNOSIS — I10 Essential (primary) hypertension: Secondary | ICD-10-CM | POA: Diagnosis not present

## 2018-11-30 DIAGNOSIS — F331 Major depressive disorder, recurrent, moderate: Secondary | ICD-10-CM | POA: Diagnosis not present

## 2018-11-30 MED ORDER — TETANUS-DIPHTH-ACELL PERTUSSIS 5-2.5-18.5 LF-MCG/0.5 IM SUSP: 0.5000 mL | Freq: Once | INTRAMUSCULAR | 0 refills | Status: AC

## 2018-11-30 MED ORDER — AMLODIPINE BESYLATE 5 MG PO TABS: 5.0000 mg | ORAL_TABLET | Freq: Every day | ORAL | 1 refills | Status: DC

## 2018-11-30 MED ORDER — PANTOPRAZOLE SODIUM 40 MG PO TBEC: 40.0000 mg | DELAYED_RELEASE_TABLET | Freq: Every day | ORAL | 1 refills | Status: DC

## 2018-11-30 MED ORDER — ZOSTER VAC RECOMB ADJUVANTED 50 MCG/0.5ML IM SUSR: 0.5000 mL | Freq: Once | INTRAMUSCULAR | 1 refills | Status: AC

## 2018-11-30 MED ORDER — MECLIZINE HCL 25 MG PO TABS: 25.0000 mg | ORAL_TABLET | ORAL | 0 refills | Status: DC | PRN

## 2018-11-30 NOTE — Patient Instructions (Addendum)
Okay to use tylenol 500 mg 2 tablets every 8 hours as needed for pain- max of 6 tablets in 24 hours.   To start a gratitude journal and every night log 3-5 things you are grateful for.  To follow up with psychiatrist if depression continues to worsen

## 2018-11-30 NOTE — Progress Notes (Signed)
This service is provided via telemedicine  No vital signs collected/recorded due to the encounter was a telemedicine visit.   Location of patient (ex: home, work):  Home   Patient consents to a telephone visit: Yes  Location of the provider (ex: office, home):  [facility], Office   Name of any referring provider: N/A  Names of all persons participating in the telemedicine service and their role in the encounter:  S.Chrae B/CMA,[name](daughter), Sherrie Mustache, NP, and Patient    Time spent on call:  25 min with medical assistant   Note  I connected with [name] on 11/30/18 at 10:30 AM EDT by telephone and verified that I am speaking with the correct person using two identifiers.   I discussed the limitations, risks, security and privacy concerns of performing an evaluation and management service by telephone and the availability of in person appointments. I also discussed with the patient that there may be a patient responsible charge related to this service. The patient expressed understanding and agreed to proceed.       Careteam: Patient Care Team: Lauree Chandler, NP as PCP - General (Geriatric Medicine) Irene Shipper, MD as Consulting Physician (Gastroenterology) Frederik Pear, MD as Consulting Physician (Orthopedic Surgery) Fontaine, Belinda Block, MD as Consulting Physician (Gynecology) Rutherford Guys, MD as Consulting Physician (Ophthalmology) Bjorn Loser, MD as Consulting Physician (Urology) Serafina Mitchell, MD as Consulting Physician (Vascular Surgery) Cameron Sprang, MD as Consulting Physician (Neurology) Desma Maxim, MD as Referring Physician (Ophthalmology) Thayer Headings, PMHNP as Nurse Practitioner (Psychiatry) Cameron Sprang, MD as Consulting Physician (Neurology)  Advanced Directive information Does Patient Have a Medical Advance Directive?: Yes, Type of Advance Directive: [place];Living will, Does  patient want to make changes to medical advance directive?: Yes (MAU/Ambulatory/Procedural Areas - Information given)  Allergies  Allergen Reactions  . Donepezil Other (See Comments)    Altered mood, aggression, and caused anger  . Duloxetine Hcl     Increased confusion and memory concerns   . Hydrocodone-Acetaminophen Other (See Comments)    "Delirium, confusion, toxic dementia"  . Meloxicam     Pt suspects that this medicine causes fluid on her lungs.   . Memantine Other (See Comments)    Caused severe aggression  . 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 Vicodin [Hydrocodone-Acetaminophen] Other (See Comments)    Delirium, confusion, and toxic dementia    Chief Complaint  Patient presents with  . Medical Management of Chronic Issues    Follow-up, last ov 12/2017. Patient c/o tremors and numbness (at night) with left arm x couple of months. Behavioral Health provider would like for [place] to order some labs as a follow-up on tremours.Televisit   . Immunizations    Shingrix and Tdap sent to [facility]  . Advanced Directive    Will mail HCPOA and Living Will  . Medication Management    Discuss need for B6  . Medication Refill    Refill Meclizine at [facility]. Refill Protonix and Norvasc at [facility]      HPI: Patient is a 78 y.o. female for routine follow up.  Daughter with pt. They live together.   HTN- on norvasc and valsartan, attempts diet modifications but she is not always compliant.   Hyperlipidemia- Taking Crestor 10 mg daily, daughter cooks healthy meals    GERD- increased indigestion/GERD dexilant was prescribed but too expensive, has  been an issue for a long time. Was worked up in the past without abnormal findings. Does not want further work up. Was started on protonix which has significantly helped.   Chronic back/hip pain- in a lot of pain. Taking muscle relaxer (which "knocks her out" with tylenol. When pain  gets bad she will not get up.  Does not routinely take tylenol but reports it does not work. Daughter states it helps more than she gives it credit for. Considering making appt with orthopedic which she has followed with in the past at [facility] orthopedic. 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   Depression is worse due to staying inside, not getting out, more negative.  More sleeping. Daughter is also depressed and making it hard for them.  She has been encouraging her to do more and they are thinking of activities to do. She is on lexapro 10 mg by mouth daily  Continues to follow a psychiatrist at [facility].   Wakes up at night and notices left arm is painful and numb.she was having issues the last time she saw vein and vascular. history of undergoing left carotid to subclavian artery bypass graft on 12/15/2011 and no abnormal finding noted at this time. ?positional.    Review of Systems:  Review of Systems  Constitutional: Negative for chills, fever, malaise/fatigue and weight loss.  HENT: Negative for sore throat and tinnitus.   Respiratory: Negative for cough, sputum production and shortness of breath.   Cardiovascular: Negative for chest pain, palpitations and leg swelling.  Gastrointestinal: Positive for heartburn. Negative for abdominal pain, constipation and diarrhea.  Genitourinary: Negative for dysuria, frequency and urgency.  Musculoskeletal: Positive for back pain, joint pain and myalgias. Negative for falls.  Skin: Negative.   Neurological: Negative for dizziness and headaches.  Psychiatric/Behavioral: Positive for depression. Negative for memory loss. The patient is nervous/anxious and has insomnia.     Past Medical History:  Diagnosis Date  . AMI  11/17/2008   Qualifier: Diagnosis of  By: Marland Mcalpine    . Anxiety   . Atherosclerotic peripheral vascular disease ([place])   . Bacteremia, escherichia coli 04/27/2015  . Brachial-basilar insufficiency syndrome   . CAD (coronary artery disease)    followed by dr [name].  . Celiac artery stenosis ([place])   . CHF (congestive heart failure) ([place]) 11/14/2017  . Chronic bilateral low back pain without sciatica   . Cognitive impairment   . Colon polyps   . Community acquired pneumonia   . Depression   . Diverticulosis   . Dysuria   . Encephalomalacia   . GAD (generalized anxiety disorder)   . GERD (gastroesophageal reflux disease)   . Glaucoma   . Hearing loss   . Hemorrhoids   . Hypertension   . Hypothyroidism   . Incontinence   . Mixed hyperlipidemia   . Myocardial infarction ([place])   . OSA on CPAP   . Peripheral arterial disease ([place])   . Pneumonitis 04/26/2015  . Pyelonephritis   . Pyelonephritis due to Escherichia coli 04/28/2015  . Sepsis ([place])   . Sinus drainage    took z-pack   finished yesterday  . Spondylosis of lumbar region without myelopathy or radiculopathy   . 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[place])   . Urticaria   . Vertigo,  benign positional    Past Surgical History:  Procedure Laterality Date  . ABDOMINAL AORTOGRAM W/LOWER EXTREMITY N/A 08/22/2017   Procedure: ABDOMINAL AORTOGRAM W/LOWER EXTREMITY;  Surgeon: Serafina Mitchell, MD;  Location: [place] CV LAB;  Service: Cardiovascular;  Laterality: N/A;  . BILATERAL UPPER EXTREMITY ANGIOGRAM N/A 09/11/2012   Procedure: BILATERAL UPPER EXTREMITY ANGIOGRAM;  Surgeon: Serafina Mitchell, MD;  Location: [facility] CATH LAB;  Service: Cardiovascular;  Laterality: N/A;  . carotid duplex doppler Bilateral 09/03/2012, 11/03/2011   Evidence of 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 patent left  common carotid- subclavian artery bypass with turbulent flow noted at the anastomosis with velocities of 295 cm/s  . CAROTID-SUBCLAVIAN BYPASS GRAFT  12/15/2011   Procedure: BYPASS GRAFT CAROTID-SUBCLAVIAN;  Surgeon: Serafina Mitchell, MD;  Location: [facility] OR;  Service: Vascular;  Laterality: Left;  Left Carotid subclavian bypass  . CORONARY ANGIOPLASTY    . CORONARY ARTERY BYPASS GRAFT    . DOPPLER ECHOCARDIOGRAPHY  05/27/2010, 09/17/2008   Mild Proximal septal thickening is noted. Left ventricular systolic functions is normal ejection fraction =>55%. the aortic valve appears to be mildly sclerotic   . fem-fem bypass graft  1999  . holter monitor  01/21/2008   The predominant rhythm was normal sinus rhythm. Minimum heartrate of 63 bpm at +01:00, maximum heartrate of 105 bpm at + 10:35; and the average heartrate of 75 bpm. Ventricular ectopic activity totaled 1270: Multifocal; 866-PVC's and 404-VEs             . JOINT REPLACEMENT     Left knee  . LEFT HEART CATHETERIZATION WITH CORONARY/GRAFT ANGIOGRAM N/A 12/21/2011   Procedure: LEFT HEART CATHETERIZATION WITH Beatrix Fetters;  Surgeon: Leonie Man, MD;  Location: [facility] CATH LAB;  Service: Cardiovascular;  Laterality: N/A;  . NM MYOCAR PERF EJECTION FRACTION  09/22/2009, 07/03/2007   the post stress myocardial perfusion images show a normal pattern of perfusion is all regions. The post-stress ejection fraction is 68 %. no significant wall motion abnormalities noted. This is a low risk scan.  Marland Kitchen PERIPHERAL VASCULAR INTERVENTION  08/22/2017   Procedure: PERIPHERAL VASCULAR INTERVENTION;  Surgeon: Serafina Mitchell, MD;  Location: [place] CV LAB;  Service: Cardiovascular;;  Fem/Fem Graft  . REPLACEMENT TOTAL KNEE  05-2011  . UNILATERAL UPPER EXTREMEITY ANGIOGRAM Left 11/15/2011   Procedure: UNILATERAL UPPER EXTREMEITY ANGIOGRAM;  Surgeon: Lorretta Harp, MD;  Location: [facility] CATH LAB;  Service: Cardiovascular;  Laterality: Left;   Social History:    reports that she quit smoking about 37 years ago. Her smoking use included cigarettes. She quit after 3.00 years of use. She has never used smokeless tobacco. She reports previous alcohol use. She reports that she does not use drugs.  Family History  Problem Relation Age of Onset  . Heart attack Mother   . Heart disease Mother        before age 75  . Diabetes Father   . Heart disease Father   . Hypertension Father   . Hyperlipidemia Father   . Heart attack Father 12  . Heart attack Brother 14  . Cerebral palsy Sister 54  . Congestive Heart Failure Sister 27  . Heart attack Sister 58  . Hypertension Sister 33  . Dementia Sister   . Anxiety disorder Sister   . Anxiety disorder Sister 3  . Heart Problems Sister   . Stroke Sister   .  Heart Problems Sister 19  . Heart attack Sister 18  . Colon cancer Brother 56  . Prostate cancer Brother 54  . Hypertension Daughter   . Irritable bowel syndrome Daughter   . Depression Daughter   . Anxiety disorder Daughter     Medications: Patient's Medications  New Prescriptions   AMLODIPINE (NORVASC) 5 MG TABLET    Take 1 tablet (5 mg total) by mouth daily.  Previous Medications   ACETAMINOPHEN (TYLENOL) 325 MG TABLET    Take 325 mg by mouth every 6 (six) hours as needed (for pain).   ASPIRIN EC 81 MG TABLET    Take 81 mg by mouth at bedtime.    BRIMONIDINE (ALPHAGAN) 0.2 % OPHTHALMIC SOLUTION    Place 1 drop into both eyes 2 (two) times daily.   CHOLECALCIFEROL (VITAMIN D) 400 UNITS TABS TABLET    Take 400 Units by mouth daily.   CLOPIDOGREL (PLAVIX) 75 MG TABLET    Take 1 tablet (75 mg total) by mouth daily.   DIVALPROEX (DEPAKOTE ER) 250 MG 24 HR TABLET    Take 1 tablet by mouth every morning and 2 tablets every night at bedtime.   DORZOLAMIDE (TRUSOPT) 2 % OPHTHALMIC SOLUTION    Place 1 drop into both eyes 2 (two) times daily.   ESCITALOPRAM (LEXAPRO) 10 MG TABLET    Take 1 tablet (10 mg total) by mouth daily.   FUROSEMIDE (LASIX) 40 MG  TABLET    Take 1 tablet (40 mg total) by mouth daily as needed. Take 1 tablet if you have more than a 2 pound weight gain in 24 hours as needed.   LATANOPROST (XALATAN) 0.005 % OPHTHALMIC SOLUTION    Place 1 drop into both eyes at bedtime.   LEVOTHYROXINE (SYNTHROID, LEVOTHROID) 200 MCG TABLET    TAKE 1 TABLET(200 MCG) BY MOUTH DAILY BEFORE BREAKFAST   METOPROLOL TARTRATE (LOPRESSOR) 25 MG TABLET    Take 12.5 mg by mouth 2 (two) times daily.   MIRABEGRON ER (MYRBETRIQ) 25 MG TB24 TABLET    Take 25 mg by mouth daily.    PROMETHAZINE (PHENERGAN) 12.5 MG TABLET    Take 12.5 mg by mouth every 6 (six) hours as needed for nausea or vomiting.   RANITIDINE (ZANTAC) 150 MG TABLET    Take 150 mg by mouth as needed.    ROSUVASTATIN (CRESTOR) 20 MG TABLET    TAKE 1 TABLET(20 MG) BY MOUTH DAILY   TIZANIDINE (ZANAFLEX) 2 MG TABLET    Take 2 mg by mouth every 6 (six) hours as needed for muscle spasms.   TRAMADOL (ULTRAM) 50 MG TABLET    TAKE 1 TABLET EVERY 4 HOURS AS NEEDED FOR PAIN   VALSARTAN (DIOVAN) 80 MG TABLET    TAKE 1 TABLET(80 MG) BY MOUTH EVERY MORNING  Modified Medications   Modified Medication Previous Medication   MECLIZINE (ANTIVERT) 25 MG TABLET meclizine (ANTIVERT) 25 MG tablet      Take 1 tablet (25 mg total) by mouth as needed for dizziness.    Take 25 mg by mouth as needed for dizziness.    PANTOPRAZOLE (PROTONIX) 40 MG TABLET pantoprazole (PROTONIX) 40 MG tablet      Take 1 tablet (40 mg total) by mouth daily.    TAKE 1 TABLET BY MOUTH DAILY   TDAP (BOOSTRIX) 5-2.5-18.5 LF-MCG/0.5 INJECTION Tdap (BOOSTRIX) 5-2.5-18.5 LF-MCG/0.5 injection      Inject 0.5 mLs into the muscle once for 1 dose.    Inject 0.5  mLs into the muscle once.   ZOSTER VACCINE ADJUVANTED [facility]) INJECTION Zoster Vaccine Adjuvanted [facility]) injection      Inject 0.5 mLs into the muscle once for 1 dose.    Inject 0.5 mLs into the muscle once.  Discontinued Medications   AMLODIPINE (NORVASC) 5 MG TABLET    Take 5 mg by  mouth daily.   METOPROLOL TARTRATE (LOPRESSOR) 25 MG TABLET    Take 1 tablet (25 mg total) by mouth 2 (two) times daily.   THIAMINE HCL (VITAMIN B-1) 250 MG TABLET    Take 250 mg by mouth daily.     Physical Exam: unable due to televisit    Labs reviewed: Basic Metabolic Panel: Recent Labs    12/28/17 1045  NA 137  K 4.6  CL 103  CO2 25  GLUCOSE 101*  BUN 18  CREATININE 0.96*  CALCIUM 9.4   Liver Function Tests: Recent Labs    12/28/17 1045  AST 17  ALT 14  BILITOT 0.9  PROT 6.9   No results for input(s): LIPASE, AMYLASE in the last 8760 hours. No results for input(s): AMMONIA in the last 8760 hours. CBC: No results for input(s): WBC, NEUTROABS, HGB, HCT, MCV, PLT in the last 8760 hours. Lipid Panel: Recent Labs    12/28/17 1045  CHOL 143  HDL 37*  LDLCALC 77  TRIG 195*  CHOLHDL 3.9   TSH: No results for input(s): TSH in the last 8760 hours. A1C: No results found for: HGBA1C   Assessment/Plan  1. Gastroesophageal reflux disease, esophagitis presence not specified Stable, improved with addition of protonix. Continue currnet regimen  - pantoprazole (PROTONIX) 40 MG tablet; Take 1 tablet (40 mg total) by mouth daily.  Dispense: 90 tablet; Refill: 1  2. Mood disorder ([place]) Improved on depakote, ongoing follow up with psychiatrist and neurologist.   3. OSA on CPAP encouraged compliance with cpap  4. Essential hypertension -controlled on current regimen - amLODipine (NORVASC) 5 MG tablet; Take 1 tablet (5 mg total) by mouth daily.  Dispense: 90 tablet; Refill: 1 - CBC with Differential/Platelet; Future - CMP with eGFR([facility]); Future  5. Acquired hypothyroidism -continues on synthroid, will follow up TSH - TSH; Future  6. Other chronic pain To back and hips. Tylenol is effective. Encouraged to use tylenol twice daily routinely to reduce pain. Increase activity as tolerates. If progressively worsen to follow up with orthopedics.   7.  Hyperlipidemia LDL goal <70 -overdue for lipids. Continues on crestor  - Lipid Panel; Future - CMP with eGFR([facility]); Future  8. Coronary artery disease involving native coronary artery of native heart without angina pectoris -stable at this time, continues on plavix 75 mg by mouth daily and asa 81 mg daily  - CBC with Differential/Platelet; Future    Next appt: 12/19/2018 [name]. Harle Battiest  [facility] [phone]  Follow Up Instructions:    I discussed the assessment and treatment plan with the patient. The patient was provided an opportunity to ask questions and all were answered. The patient agreed with the plan and demonstrated an understanding of the instructions.   The patient was advised to call back or seek an in-person evaluation if the symptoms worsen or if the condition fails to improve as anticipated.  I provided 27 minutes of non-face-to-face time during this encounter.  avs printed and mailed Lauree Chandler, NP

## 2018-12-02 ENCOUNTER — Encounter: Payer: Self-pay | Admitting: Nurse Practitioner

## 2018-12-10 DIAGNOSIS — H401132 Primary open-angle glaucoma, bilateral, moderate stage: Secondary | ICD-10-CM | POA: Diagnosis not present

## 2018-12-10 DIAGNOSIS — Z961 Presence of intraocular lens: Secondary | ICD-10-CM | POA: Diagnosis not present

## 2018-12-10 DIAGNOSIS — H02834 Dermatochalasis of left upper eyelid: Secondary | ICD-10-CM | POA: Diagnosis not present

## 2018-12-10 DIAGNOSIS — H02831 Dermatochalasis of right upper eyelid: Secondary | ICD-10-CM | POA: Diagnosis not present

## 2018-12-10 DIAGNOSIS — H52203 Unspecified astigmatism, bilateral: Secondary | ICD-10-CM | POA: Diagnosis not present

## 2018-12-10 DIAGNOSIS — H35372 Puckering of macula, left eye: Secondary | ICD-10-CM | POA: Diagnosis not present

## 2018-12-10 DIAGNOSIS — H524 Presbyopia: Secondary | ICD-10-CM | POA: Diagnosis not present

## 2018-12-19 ENCOUNTER — Other Ambulatory Visit: Payer: Medicare HMO

## 2018-12-19 DIAGNOSIS — H02831 Dermatochalasis of right upper eyelid: Secondary | ICD-10-CM | POA: Diagnosis not present

## 2018-12-19 DIAGNOSIS — H02834 Dermatochalasis of left upper eyelid: Secondary | ICD-10-CM | POA: Diagnosis not present

## 2018-12-19 DIAGNOSIS — H02413 Mechanical ptosis of bilateral eyelids: Secondary | ICD-10-CM | POA: Diagnosis not present

## 2018-12-19 DIAGNOSIS — H57813 Brow ptosis, bilateral: Secondary | ICD-10-CM | POA: Diagnosis not present

## 2018-12-19 DIAGNOSIS — H53483 Generalized contraction of visual field, bilateral: Secondary | ICD-10-CM | POA: Diagnosis not present

## 2018-12-19 DIAGNOSIS — H0279 Other degenerative disorders of eyelid and periocular area: Secondary | ICD-10-CM | POA: Diagnosis not present

## 2018-12-21 ENCOUNTER — Ambulatory Visit: Payer: Medicare HMO | Admitting: Cardiovascular Disease

## 2019-01-11 ENCOUNTER — Other Ambulatory Visit: Payer: Self-pay

## 2019-01-11 ENCOUNTER — Other Ambulatory Visit: Payer: Medicare HMO

## 2019-01-11 DIAGNOSIS — R739 Hyperglycemia, unspecified: Secondary | ICD-10-CM | POA: Diagnosis not present

## 2019-01-11 DIAGNOSIS — E785 Hyperlipidemia, unspecified: Secondary | ICD-10-CM | POA: Diagnosis not present

## 2019-01-11 DIAGNOSIS — I10 Essential (primary) hypertension: Secondary | ICD-10-CM | POA: Diagnosis not present

## 2019-01-11 DIAGNOSIS — E039 Hypothyroidism, unspecified: Secondary | ICD-10-CM | POA: Diagnosis not present

## 2019-01-11 DIAGNOSIS — I2510 Atherosclerotic heart disease of native coronary artery without angina pectoris: Secondary | ICD-10-CM | POA: Diagnosis not present

## 2019-01-17 ENCOUNTER — Other Ambulatory Visit: Payer: Self-pay

## 2019-01-17 MED ORDER — ALPRAZOLAM 0.5 MG PO TABS: 0.5000 mg | ORAL_TABLET | Freq: Every day | ORAL | 0 refills | Status: DC | PRN

## 2019-01-18 LAB — COMPLETE METABOLIC PANEL WITH GFR
AG Ratio: 1.7 (calc) (ref 1.0–2.5)
ALT: 7 U/L (ref 6–29)
AST: 12 U/L (ref 10–35)
Albumin: 4.1 g/dL (ref 3.6–5.1)
Alkaline phosphatase (APISO): 63 U/L (ref 37–153)
BUN/Creatinine Ratio: 24 (calc) — ABNORMAL HIGH (ref 6–22)
BUN: 24 mg/dL (ref 7–25)
CO2: 29 mmol/L (ref 20–32)
Calcium: 9.2 mg/dL (ref 8.6–10.4)
Chloride: 102 mmol/L (ref 98–110)
Creat: 0.99 mg/dL — ABNORMAL HIGH (ref 0.60–0.93)
GFR, Est African American: 64 mL/min/{1.73_m2} (ref >=60)
GFR, Est Non African American: 55 mL/min/{1.73_m2} — ABNORMAL LOW (ref >=60)
Globulin: 2.4 g/dL (calc) (ref 1.9–3.7)
Glucose, Bld: 106 mg/dL — ABNORMAL HIGH (ref 65–99)
Potassium: 4.9 mmol/L (ref 3.5–5.3)
Sodium: 140 mmol/L (ref 135–146)
Total Bilirubin: 0.7 mg/dL (ref 0.2–1.2)
Total Protein: 6.5 g/dL (ref 6.1–8.1)

## 2019-01-18 LAB — LIPID PANEL
Cholesterol: 134 mg/dL (ref <200)
HDL: 35 mg/dL — ABNORMAL LOW (ref >=50)
LDL Cholesterol (Calc): 71 mg/dL (calc)
Non-HDL Cholesterol (Calc): 99 mg/dL (calc) (ref <130)
Total CHOL/HDL Ratio: 3.8 (calc) (ref <5.0)
Triglycerides: 211 mg/dL — ABNORMAL HIGH (ref <150)

## 2019-01-18 LAB — CBC WITH DIFFERENTIAL/PLATELET
Absolute Monocytes: 442 cells/uL (ref 200–950)
Basophils Absolute: 40 cells/uL (ref 0–200)
Basophils Relative: 0.6 %
Eosinophils Absolute: 79 cells/uL (ref 15–500)
Eosinophils Relative: 1.2 %
HCT: 39.1 % (ref 35.0–45.0)
Hemoglobin: 13 g/dL (ref 11.7–15.5)
Lymphs Abs: 2383 cells/uL (ref 850–3900)
MCH: 28.5 pg (ref 27.0–33.0)
MCHC: 33.2 g/dL (ref 32.0–36.0)
MCV: 85.7 fL (ref 80.0–100.0)
MPV: 9.7 fL (ref 7.5–12.5)
Monocytes Relative: 6.7 %
Neutro Abs: 3656 cells/uL (ref 1500–7800)
Neutrophils Relative %: 55.4 %
Platelets: 129 10*3/uL — ABNORMAL LOW (ref 140–400)
RBC: 4.56 10*6/uL (ref 3.80–5.10)
RDW: 13 % (ref 11.0–15.0)
Total Lymphocyte: 36.1 %
WBC: 6.6 10*3/uL (ref 3.8–10.8)

## 2019-01-18 LAB — TSH: TSH: 4.43 mIU/L (ref 0.40–4.50)

## 2019-01-18 LAB — HEMOGLOBIN A1C
Hgb A1c MFr Bld: 5.6 % of total Hgb (ref <5.7)
Mean Plasma Glucose: 114 (calc)
eAG (mmol/L): 6.3 (calc)

## 2019-01-18 LAB — TEST AUTHORIZATION

## 2019-01-23 ENCOUNTER — Telehealth: Payer: Self-pay

## 2019-01-23 DIAGNOSIS — M1611 Unilateral primary osteoarthritis, right hip: Secondary | ICD-10-CM | POA: Diagnosis not present

## 2019-01-23 NOTE — Telephone Encounter (Signed)
1st attempt to reach pt re: surgical clearance and the need for an appt before clearance can be given. Left message for pt to call back.

## 2019-01-23 NOTE — Telephone Encounter (Signed)
Cleared for her eyelid surgery, can hold antiplatelet agents.

## 2019-01-23 NOTE — Telephone Encounter (Signed)
   Primary Cardiologist:Jonathan Gwenlyn Found, MD  Chart reviewed as part of pre-operative protocol coverage. Because of Deanna [name] past medical history and time since last visit, he/she will require a follow-up visit in order to better assess preoperative cardiovascular risk.  Pre-op covering staff: - Please schedule appointment and call patient to inform them. - Please contact requesting surgeon's office via preferred method (i.e, phone, fax) to inform them of need for appointment prior to surgery.  Dr. Gwenlyn Found, Please give recommendations regarding antiplatelet therapy. Please forward your response to P CV DIV PREOP.   Thank you    Leanor Kail, PA  01/23/2019, 3:48 PM

## 2019-01-23 NOTE — Telephone Encounter (Signed)
   [facility] Pre-operative Risk Assessment    Request for surgical clearance:  1. What type of surgery is being performed? Bilateral Upper Eyelid Blepharoplasty    2. When is this surgery scheduled? 02/04/19   3. What type of clearance is required (medical clearance vs. Pharmacy clearance to hold med vs. Both)? Both  4. Are there any medications that need to be held prior to surgery and how long? Plavix, Aspirin   5. Practice name and name of physician performing surgery? [facility]    6. What is your office phone number [phone]    7.   What is your office fax number [phone]  8.   Anesthesia type (None, local, MAC, general) ? Unknown   Deanna [name] 01/23/2019, 2:42 PM  _________________________________________________________________   (provider comments below)

## 2019-01-24 NOTE — Telephone Encounter (Signed)
   Primary Cardiologist: Quay Burow, MD  Chart reviewed as part of pre-operative protocol coverage. Given past medical history and time since last visit, based on ACC/AHA guidelines, Deanna [name] would be at acceptable risk for the planned procedure without further cardiovascular testing.   Per Dr. Gwenlyn Found, patient can hold ASA and Plavix for the procedure and resume ASAP when safe per surgical team.   I will route this recommendation to the requesting party via Epic fax function and remove from pre-op pool.  Please call with questions.  Kathyrn Drown, NP 01/24/2019, 10:10 AM

## 2019-01-28 ENCOUNTER — Telehealth: Payer: Self-pay | Admitting: Psychiatry

## 2019-01-28 NOTE — Telephone Encounter (Signed)
Daughter [name] left vm today @1 :22 stating her mother's husband is modifying the [employer] and wants to know if she should get legal advice bc her mother has been in a outrage since receiving the letter to modify payments.  Patient has appointment with you on Thurs. 06/25

## 2019-01-29 ENCOUNTER — Other Ambulatory Visit: Payer: Self-pay | Admitting: Nurse Practitioner

## 2019-01-29 DIAGNOSIS — E785 Hyperlipidemia, unspecified: Secondary | ICD-10-CM

## 2019-01-29 NOTE — Telephone Encounter (Signed)
Left detailed information on personal vm

## 2019-01-30 ENCOUNTER — Telehealth: Payer: Self-pay | Admitting: Neurology

## 2019-01-30 NOTE — Telephone Encounter (Signed)
Not sure how or what to do with this one

## 2019-01-30 NOTE — Telephone Encounter (Signed)
Daughter [name] [phone] asking if Delice Lesch could write notice as to [name] needing to be POA if Delice Lesch deems pt incompetent to go to court for subpoena ex-spouse trying to eliminate her alimony.  [name] req assistance / advice in applying POA.

## 2019-01-30 NOTE — Telephone Encounter (Signed)
Pt daughter called back no answer voice mail left will call back tomorrow

## 2019-01-30 NOTE — Telephone Encounter (Signed)
Pls let [name] know that doctors do not do POA appointments, it is a legal document and they have to see a Chief Executive Officer. Recommend contacting an Financial trader attorney that she can look up online (I don't know anyone personally), or if she wants to contact the [place] [facility] Adult [facility] for further guidance. Their number is [phone]. Thanks

## 2019-01-31 ENCOUNTER — Ambulatory Visit: Payer: Medicare HMO | Admitting: Psychiatry

## 2019-01-31 NOTE — Telephone Encounter (Signed)
Spoke with Deanna [name] she is asking: Do you think her mother is competent enough to sign over POA to her or would it be contested in court? If you do not think she is competent enough then she is going to try and get guardianship over her mother.

## 2019-01-31 NOTE — Telephone Encounter (Signed)
Deanna [name] young returned your call and left message on VM to please return her call

## 2019-01-31 NOTE — Telephone Encounter (Signed)
Pt called no answer voice mail left for pt to call back 

## 2019-02-01 NOTE — Telephone Encounter (Signed)
Spoke with pt daughter she stated that what I had told her about POA and Guardianship she already knows she stated that she wants your medical opinion on if her mom is competent enough to sign for POA ? Pt Daughter stated that if she has too she will subpoena for your opinion? Then she stated that maybe her mom may need a new neurologist.

## 2019-02-01 NOTE — Telephone Encounter (Signed)
Pls let daughter know that POA and guardianship are different things. She and her mother can go to a Chief Executive Officer and have the daughter be appointed her [place]. Guardianship is determined by the courts and they will do their own evaluation about her competency.

## 2019-02-01 NOTE — Telephone Encounter (Signed)
Pls let daughter know that I understand her situation however memory evaluation is different from a competency evaluation. I have been seeing her mother for her memory, I have been sending patients to Dr. Cephus Shelling for competency evaluations. Pls provide her Dr. Janit Bern info. If she would like a referral to a different neurologist for second opinion, we can provide her with this information. Thanks

## 2019-02-01 NOTE — Telephone Encounter (Signed)
Pt daughter called no answer voice mail left for her to call back  

## 2019-02-04 NOTE — Telephone Encounter (Signed)
Pt daughter called no answer voice mail left for her to call back  

## 2019-02-05 NOTE — Telephone Encounter (Signed)
Left message with the after hour service on 02-04-19 @ 4:50    Deanna [name] is calling to speak with heather

## 2019-02-05 NOTE — Telephone Encounter (Signed)
Spoke with pt Daughter and gave her the number to geriatrics consulting services [phone] she was happy with getting this information,

## 2019-02-14 ENCOUNTER — Other Ambulatory Visit: Payer: Self-pay

## 2019-02-14 ENCOUNTER — Ambulatory Visit: Payer: Medicare HMO | Admitting: Psychiatry

## 2019-02-14 ENCOUNTER — Encounter: Payer: Self-pay | Admitting: Psychiatry

## 2019-02-14 DIAGNOSIS — F39 Unspecified mood [affective] disorder: Secondary | ICD-10-CM | POA: Diagnosis not present

## 2019-02-14 DIAGNOSIS — F411 Generalized anxiety disorder: Secondary | ICD-10-CM

## 2019-02-14 MED ORDER — ALPRAZOLAM 0.5 MG PO TABS: 0.5000 mg | ORAL_TABLET | Freq: Every day | ORAL | 1 refills | Status: DC | PRN

## 2019-02-14 MED ORDER — DIVALPROEX SODIUM ER 250 MG PO TB24: 250.0000 mg | ORAL_TABLET | Freq: Two times a day (BID) | ORAL | 1 refills | Status: DC

## 2019-02-14 MED ORDER — ESCITALOPRAM OXALATE 10 MG PO TABS: 10.0000 mg | ORAL_TABLET | Freq: Every day | ORAL | 1 refills | Status: DC

## 2019-02-14 NOTE — Progress Notes (Signed)
Deanna [name] [id number] 01/26/1941 78 y.o.  Subjective:   Patient ID:  Deanna [name] is a 78 y.o. (DOB 05-29-1941) female.  Chief Complaint:  Chief Complaint  Patient presents with  . Anxiety  . Depression    HPI [name] presents to the office today for follow-up of mood and anxiety. She is accompanied by her daughter. They report that pt's ex-husband recently filed for a change in [place] and this has been distressing for both the pt and her daughter. Pt reports that she has had increased anxiety in response to this stressor. Daughter reports that pt has been worried about daughter. Pt reports that she has been sleeping well. Daughter reports that pt has been awakening at times and thinking something is wrong with daughter. Pt reports that she is able to return to sleep immediately. Pt reports that her mood has been sad. They report that she has had some irritability with increased anxiety. Daughter reports that pt has been more patient in general. She reports that her energy and motivation have been fair. Daughter reports that pt's motivation and energy have improved since injection in her back. Daughter reports that pt requires assistance and reminders to take her medication. Daughter reports that she is easily distracted. She reports that her appetite has been "too good." Denies SI.   Daughter reports that pt is having tremor at times, particularly in her left hand. Daughter reports that pt's legs shake more at night. They report that her tremor is worse with anxiety.   Daughter reports that pt has been having to take 1/2 tab of Tizanidine and 1/2 of Tramadol to cope with pain and this causes drowsiness.   Denies any recent panic attacks.   Past Psychiatric Medication Trials: Sertraline-initially helpful and then no longer effective Cymbalta-increased blood pressure Prozac-possible adverse reaction Lexapro Depakote Ativan-effective but caused excessive  drowsiness Xanax-effective Aricept-anger Namenda- anger   Review of Systems:  Review of Systems  Eyes: Positive for visual disturbance.  Musculoskeletal: Positive for arthralgias and back pain. Negative for gait problem.  Skin: Positive for rash.  Neurological: Positive for tremors.  Psychiatric/Behavioral:       Please refer to HPI    Medications: I have reviewed the patient's current medications.  Current Outpatient Medications  Medication Sig Dispense Refill  . acetaminophen (TYLENOL) 325 MG tablet Take 325 mg by mouth every 6 (six) hours as needed (for pain).    Marland Kitchen ALPRAZolam (XANAX) 0.5 MG tablet Take 1 tablet (0.5 mg total) by mouth daily as needed for anxiety. 30 tablet 1  . amLODipine (NORVASC) 5 MG tablet Take 1 tablet (5 mg total) by mouth daily. 90 tablet 1  . aspirin EC 81 MG tablet Take 81 mg by mouth at bedtime.     . brimonidine (ALPHAGAN) 0.2 % ophthalmic solution Place 1 drop into both eyes 2 (two) times daily.    . cholecalciferol (VITAMIN D) 400 units TABS tablet Take 400 Units by mouth daily.    . clopidogrel (PLAVIX) 75 MG tablet Take 1 tablet (75 mg total) by mouth daily. 90 tablet 2  . divalproex (DEPAKOTE ER) 250 MG 24 hr tablet Take 1 tablet (250 mg total) by mouth 2 (two) times a day. 60 tablet 1  . dorzolamide (TRUSOPT) 2 % ophthalmic solution Place 1 drop into both eyes 2 (two) times daily.    . furosemide (LASIX) 40 MG tablet Take 1 tablet (40 mg total) by mouth daily as needed. Take 1 tablet  if you have more than a 2 pound weight gain in 24 hours as needed. 30 tablet 11  . latanoprost (XALATAN) 0.005 % ophthalmic solution Place 1 drop into both eyes at bedtime.    Marland Kitchen levothyroxine (SYNTHROID, LEVOTHROID) 200 MCG tablet TAKE 1 TABLET(200 MCG) BY MOUTH DAILY BEFORE BREAKFAST 90 tablet 1  . meclizine (ANTIVERT) 25 MG tablet Take 1 tablet (25 mg total) by mouth as needed for dizziness. 30 tablet 0  . metoprolol tartrate (LOPRESSOR) 25 MG tablet Take 12.5 mg  by mouth 2 (two) times daily.    . mirabegron ER (MYRBETRIQ) 25 MG TB24 tablet Take 25 mg by mouth daily.     . pantoprazole (PROTONIX) 40 MG tablet Take 1 tablet (40 mg total) by mouth daily. 90 tablet 1  . promethazine (PHENERGAN) 12.5 MG tablet Take 12.5 mg by mouth every 6 (six) hours as needed for nausea or vomiting.    . ranitidine (ZANTAC) 150 MG tablet Take 150 mg by mouth as needed.     . rosuvastatin (CRESTOR) 20 MG tablet TAKE 1 TABLET(20 MG) BY MOUTH DAILY 90 tablet 0  . tiZANidine (ZANAFLEX) 2 MG tablet Take 2 mg by mouth every 6 (six) hours as needed for muscle spasms.    . traMADol (ULTRAM) 50 MG tablet TAKE 1 TABLET EVERY 4 HOURS AS NEEDED FOR PAIN 30 tablet 0  . valsartan (DIOVAN) 80 MG tablet TAKE 1 TABLET(80 MG) BY MOUTH EVERY MORNING 90 tablet 0  . escitalopram (LEXAPRO) 10 MG tablet Take 1 tablet (10 mg total) by mouth daily. 90 tablet 1   No current facility-administered medications for this visit.     Medication Side Effects: Other: Tremor  Allergies:  Allergies  Allergen Reactions  . Donepezil Other (See Comments)    Altered mood, aggression, and caused anger  . Duloxetine Hcl     Increased confusion and memory concerns   . Hydrocodone-Acetaminophen Other (See Comments)    "Delirium, confusion, toxic dementia"  . Meloxicam     Pt suspects that this medicine causes fluid on her lungs.   . Memantine Other (See Comments)    Caused severe aggression  . 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 Vicodin [Hydrocodone-Acetaminophen] Other (See Comments)    Delirium, confusion, and toxic dementia    Past Medical History:  Diagnosis Date  . AMI 11/17/2008   Qualifier: Diagnosis of  By: Marland Mcalpine    . Anxiety   . Atherosclerotic peripheral vascular disease ([place])   . Bacteremia, escherichia coli 04/27/2015  . Brachial-basilar insufficiency syndrome   . CAD (coronary artery disease)    followed by dr  [name].  . Celiac artery stenosis ([place])   . CHF (congestive heart failure) ([place]) 11/14/2017  . Chronic bilateral low back pain without sciatica   . Cognitive impairment   . Colon polyps   . Community acquired pneumonia   . Depression   . Diverticulosis   . Dysuria   . Encephalomalacia   . GAD (generalized anxiety disorder)   . GERD (gastroesophageal reflux disease)   . Glaucoma   . Hearing loss   . Hemorrhoids   . Hypertension   . Hypothyroidism   . Incontinence   . Mixed hyperlipidemia   . Myocardial infarction ([place])   . OSA on CPAP   . Peripheral arterial disease ([place])   . Pneumonitis 04/26/2015  . Pyelonephritis   . Pyelonephritis due to  Escherichia coli 04/28/2015  . Sepsis ([place])   . Sinus drainage    took z-pack   finished yesterday  . Spondylosis of lumbar region without myelopathy or radiculopathy   . 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[place])   . Urticaria   . Vertigo, benign positional     Family History  Problem Relation Age of Onset  . Heart attack Mother   . Heart disease Mother        before age 59  . Diabetes Father   . Heart disease Father   . Hypertension Father   . Hyperlipidemia Father   . Heart attack Father 3  . Heart attack Brother 55  . Cerebral palsy Sister 20  . Congestive Heart Failure Sister 72  . Heart attack Sister 31  . Hypertension Sister 76  . Dementia Sister   . Anxiety disorder Sister   . Anxiety disorder Sister 49  . Heart Problems Sister   . Stroke Sister   . Heart Problems Sister 60  . Heart attack Sister 98  . Colon cancer Brother 42  . Prostate cancer Brother 44  . Hypertension Daughter   . Irritable bowel syndrome Daughter   . Depression Daughter   . Anxiety disorder Daughter     Social History   Socioeconomic History  . Marital status: Divorced    Spouse name: Not on file  . Number of children: Not on file  . Years of education: Not on file  . Highest education level: Not on file  Occupational History  . Not on  file  Social Needs  . Financial resource strain: Not on file  . Food insecurity    Worry: Not on file    Inability: Not on file  . Transportation needs    Medical: Not on file    Non-medical: Not on file  Tobacco Use  . Smoking status: Former Smoker    Years: 3.00    Types: Cigarettes    Quit date: 11/27/1981    Years since quitting: 37.2  . Smokeless tobacco: Never Used  Substance and Sexual Activity  . Alcohol use: Not Currently    Alcohol/week: 0.0 standard drinks    Frequency: Never  . Drug use: No  . Sexual activity: Not Currently    Comment: 1st intercourse 67 yo-1 partner  Lifestyle  . Physical activity    Days per week: Not on file    Minutes per session: Not on file  . Stress: Not on file  Relationships  . Social Herbalist on phone: Not on file    Gets together: Not on file    Attends religious service: Not on file    Active member of club or organization: Not on file    Attends meetings of clubs or organizations: Not on file    Relationship status: Not on file  . Intimate partner violence    Fear of current or ex partner: Not on file    Emotionally abused: Not on file    Physically abused: Not on file    Forced sexual activity: Not on file  Other Topics Concern  . Not on file  Social History Narrative   Social History      Diet? Healthy but too many sweets      Do you drink/eat things with caffeine? Yes occasionally      Marital status?  D                What year were you married?      Do you live in a house, apartment, assisted living, condo, trailer, etc.? condo      Is it one or more stories? 1      How many persons live in your home? 2      Do you have any pets in your home? (please list) 1 cat      Highest level of education completed? 1 year college      Current or past profession: housewife      Do you exercise?           no                           Type & how often?      Advanced Directives      Do you  have a living will? no      Do you have a DNR form?             no                     If not, do you want to discuss one? yes      Do you have signed POA/HPOA for forms? no      Functional Status      Do you have difficulty bathing or dressing yourself? No- gets tired      Do you have difficulty preparing food or eating? No- eats frozen entrees or poor nutrition meals      Do you have difficulty managing your medications? yes      Do you have difficulty managing your finances? yes      Do you have difficulty affording your medications? No- funds running low    Past Medical History, Surgical history, Social history, and Family history were reviewed and updated as appropriate.   Please see review of systems for further details on the patient's review from today.   Objective:   Physical Exam:  There were no vitals taken for this visit.  Physical Exam Constitutional:      General: She is not in acute distress.    Appearance: She is well-developed.  Musculoskeletal:     Comments: Uses Rollator to ambulate  Neurological:     Mental Status: She is alert and oriented to person, place, and time.     Coordination: Coordination normal.  Psychiatric:        Attention and Perception: Attention and perception normal. She does not perceive auditory or visual hallucinations.        Mood and Affect: Mood normal. Mood is not anxious or depressed. Affect is not labile, blunt, angry or inappropriate.        Speech: Speech normal.        Behavior: Behavior normal.        Thought Content: Thought content normal. Thought content is not paranoid or delusional. Thought content does not include homicidal or suicidal ideation. Thought content does not include homicidal or suicidal plan.        Cognition and Memory: Cognition is impaired. Memory is impaired. She exhibits impaired recent memory.        Judgment: Judgment normal.     Comments: Insight intact. No delusions.      Lab Review:      Component Value Date/Time   NA  140 01/11/2019 1002   K 4.9 01/11/2019 1002   CL 102 01/11/2019 1002   CO2 29 01/11/2019 1002   GLUCOSE 106 (H) 01/11/2019 1002   BUN 24 01/11/2019 1002   CREATININE 0.99 (H) 01/11/2019 1002   CALCIUM 9.2 01/11/2019 1002   PROT 6.5 01/11/2019 1002   ALBUMIN 4.6 12/18/2015 1000   AST 12 01/11/2019 1002   ALT 7 01/11/2019 1002   ALKPHOS 81 12/18/2015 1000   BILITOT 0.7 01/11/2019 1002   GFRNONAA 55 (L) 01/11/2019 1002   GFRAA 64 01/11/2019 1002       Component Value Date/Time   WBC 6.6 01/11/2019 1002   RBC 4.56 01/11/2019 1002   HGB 13.0 01/11/2019 1002   HCT 39.1 01/11/2019 1002   PLT 129 (L) 01/11/2019 1002   MCV 85.7 01/11/2019 1002   MCH 28.5 01/11/2019 1002   MCHC 33.2 01/11/2019 1002   RDW 13.0 01/11/2019 1002   LYMPHSABS 2,383 01/11/2019 1002   MONOABS 0.5 04/27/2015 0310   EOSABS 79 01/11/2019 1002   BASOSABS 40 01/11/2019 1002    No results found for: POCLITH, LITHIUM   No results found for: PHENYTOIN, PHENOBARB, VALPROATE, CBMZ   .res Assessment: Plan:   Discussed that Depakote could be contributing to tremor.  Discussed option of lowering Depakote dose to determine if this is for reducing tremor.  Encouraged patient and her daughter to follow-up with neurologist if tremor persists.  Discussed that patient and her daughter reporting longstanding history of moving her legs frequently could be indicative of restless leg syndrome.  Discussed that occasionally antidepressants can exacerbate restless leg syndrome and will continue to monitor for possible worsening restless legs with Lexapro. Will decrease Depakote ER to 250 mg twice daily for mood stabilization. Will continue Lexapro 10 mg daily for depression and anxiety. Will continue Xanax as needed for anxiety. Patient to follow-up in 4 to 6 weeks or sooner if clinically indicated. Patient advised to contact office with any questions, adverse effects, or acute worsening in signs  and symptoms.  Shirel was seen today for anxiety and depression.  Diagnoses and all orders for this visit:  Mood disorder ([place]) -     divalproex (DEPAKOTE ER) 250 MG 24 hr tablet; Take 1 tablet (250 mg total) by mouth 2 (two) times a day. -     escitalopram (LEXAPRO) 10 MG tablet; Take 1 tablet (10 mg total) by mouth daily.  Generalized anxiety disorder -     escitalopram (LEXAPRO) 10 MG tablet; Take 1 tablet (10 mg total) by mouth daily. -     ALPRAZolam (XANAX) 0.5 MG tablet; Take 1 tablet (0.5 mg total) by mouth daily as needed for anxiety.     Please see After Visit Summary for patient specific instructions.  Future Appointments  Date Time Provider [place]  03/22/2019  3:15 PM Thayer Headings, PMHNP [facility] None  03/27/2019  3:00 PM Lorretta Harp, MD [facility]  04/03/2019 11:00 AM Lauree Chandler, NP [facility] None    No orders of the defined types were placed in this encounter.   -------------------------------

## 2019-02-19 ENCOUNTER — Other Ambulatory Visit: Payer: Self-pay | Admitting: Psychiatry

## 2019-02-19 DIAGNOSIS — F39 Unspecified mood [affective] disorder: Secondary | ICD-10-CM

## 2019-02-21 ENCOUNTER — Other Ambulatory Visit: Payer: Self-pay | Admitting: Nurse Practitioner

## 2019-03-01 ENCOUNTER — Other Ambulatory Visit: Payer: Self-pay | Admitting: *Deleted

## 2019-03-01 MED ORDER — TRAMADOL HCL 50 MG PO TABS: ORAL_TABLET | ORAL | 0 refills | Status: DC

## 2019-03-01 NOTE — Telephone Encounter (Signed)
Patient daughter, [name] called and requested refill for patient. Stated that her last refill was in 04/2018. Patient has been having more pain in her back from Osteoarthritis and needs a refill.   Pended Rx and sent to [facility] for approval. Janett Billow out of office.

## 2019-03-22 ENCOUNTER — Other Ambulatory Visit: Payer: Self-pay

## 2019-03-22 ENCOUNTER — Ambulatory Visit (INDEPENDENT_AMBULATORY_CARE_PROVIDER_SITE_OTHER): Payer: Medicare HMO | Admitting: Psychiatry

## 2019-03-22 ENCOUNTER — Ambulatory Visit: Payer: Medicare HMO | Admitting: Psychiatry

## 2019-03-22 DIAGNOSIS — F411 Generalized anxiety disorder: Secondary | ICD-10-CM | POA: Diagnosis not present

## 2019-03-22 DIAGNOSIS — F39 Unspecified mood [affective] disorder: Secondary | ICD-10-CM | POA: Diagnosis not present

## 2019-03-22 MED ORDER — ALPRAZOLAM 0.5 MG PO TABS: 0.5000 mg | ORAL_TABLET | Freq: Every day | ORAL | 1 refills | Status: DC | PRN

## 2019-03-22 MED ORDER — ESCITALOPRAM OXALATE 20 MG PO TABS: 20.0000 mg | ORAL_TABLET | Freq: Every day | ORAL | 1 refills | Status: DC

## 2019-03-22 NOTE — Progress Notes (Signed)
Deanna [name] [id number] [birth year] 78 y.o.  Virtual Visit via Telephone Note  I connected with pt on 03/22/19 at 11:45 AM EDT by telephone and verified that I am speaking with the correct person using two identifiers.   I discussed the limitations, risks, security and privacy concerns of performing an evaluation and management service by telephone and the availability of in person appointments. I also discussed with the patient that there may be a patient responsible charge related to this service. The patient expressed understanding and agreed to proceed.   I discussed the assessment and treatment plan with the patient. The patient was provided an opportunity to ask questions and all were answered. The patient agreed with the plan and demonstrated an understanding of the instructions.   The patient was advised to call back or seek an in-person evaluation if the symptoms worsen or if the condition fails to improve as anticipated.  I provided 30 minutes of non-face-to-face time during this encounter.  The patient was located at home.  The provider was located at [place].   Thayer Headings, PMHNP   Subjective:   Patient ID:  Deanna [name] is a 78 y.o. (DOB 1941/07/13) female.  Chief Complaint:  Chief Complaint  Patient presents with  . Follow-up    h/o mood disturbance, anxiety, and insomnia    HPI Deanna [name] presents for follow-up of mood and anxiety. Daughter also participates in conversation. She reports that decrease in Depakote has been helpful. Pt reports that her hands and legs shake less now than they were in the past. Pt reports that tremor is now tolerable. Pt reports that her mood has been "ok." She denies any increased irritability with decrease in Depakote. She reports that her anxiety has been "ok." Denies any recent panic s/s. She reports occasional worry. Daughters reports that pt has been increasingly emotionally sensitive. Pt reports that she has  been experiencing some depression. Daughter reports that pt makes frequent requests and demands. Daughter notices pt seems to be depressed and is exhibiting decreased interest in things and low energy and motivation. Appetite has been ok. Daughter reports that pt used to prepare herself food and that she is now grabbing convenience foods and forgetting that she has eaten. Pt reports her concentration is ok. Daughter reports that pt has been exhibiting increased memory impairment. Denies SI.   Daughter reports that they continue to have some stressors related to movement to reduce pt's alimony. Daughter reports that pt is taking a nap from 5-7 pm and then going back to bed at 9 pm. Awakening at 8 am.   Daughter notices pt has had more difficulty with manual dexterity.   Past Psychiatric Medication Trials: Sertraline-initially helpful and then no longer effective Cymbalta-increased blood pressure Prozac-possible adverse reaction Lexapro Depakote Ativan-effective but caused excessive drowsiness Xanax-effective Aricept-anger Namenda-anger  Review of Systems:  Review of Systems  Musculoskeletal: Negative for gait problem.  Neurological: Positive for tremors and headaches.       They report tremor has improved.   Psychiatric/Behavioral:       Please refer to HPI    Medications: I have reviewed the patient's current medications.  Current Outpatient Medications  Medication Sig Dispense Refill  . acetaminophen (TYLENOL) 325 MG tablet Take 325 mg by mouth every 6 (six) hours as needed (for pain).    Marland Kitchen ALPRAZolam (XANAX) 0.5 MG tablet Take 1 tablet (0.5 mg total) by mouth daily as needed for anxiety. 30 tablet 1  .  amLODipine (NORVASC) 5 MG tablet Take 1 tablet (5 mg total) by mouth daily. 90 tablet 1  . aspirin EC 81 MG tablet Take 81 mg by mouth at bedtime.     . brimonidine (ALPHAGAN) 0.2 % ophthalmic solution Place 1 drop into both eyes 2 (two) times daily.    . cholecalciferol  (VITAMIN D) 400 units TABS tablet Take 400 Units by mouth daily.    . clopidogrel (PLAVIX) 75 MG tablet Take 1 tablet (75 mg total) by mouth daily. 90 tablet 2  . divalproex (DEPAKOTE ER) 250 MG 24 hr tablet TAKE 1 TABLET(250 MG) BY MOUTH TWICE DAILY 180 tablet 0  . dorzolamide (TRUSOPT) 2 % ophthalmic solution Place 1 drop into both eyes 2 (two) times daily.    . furosemide (LASIX) 40 MG tablet Take 1 tablet (40 mg total) by mouth daily as needed. Take 1 tablet if you have more than a 2 pound weight gain in 24 hours as needed. 30 tablet 11  . latanoprost (XALATAN) 0.005 % ophthalmic solution Place 1 drop into both eyes at bedtime.    Marland Kitchen levothyroxine (SYNTHROID, LEVOTHROID) 200 MCG tablet TAKE 1 TABLET(200 MCG) BY MOUTH DAILY BEFORE BREAKFAST 90 tablet 1  . meclizine (ANTIVERT) 25 MG tablet Take 1 tablet (25 mg total) by mouth as needed for dizziness. 30 tablet 0  . metoprolol tartrate (LOPRESSOR) 25 MG tablet TAKE 1 TABLET (25 MG TOTAL) BY MOUTH 2 (TWO) TIMES DAILY. 180 tablet 0  . mirabegron ER (MYRBETRIQ) 25 MG TB24 tablet Take 25 mg by mouth daily.     . pantoprazole (PROTONIX) 40 MG tablet Take 1 tablet (40 mg total) by mouth daily. 90 tablet 1  . promethazine (PHENERGAN) 12.5 MG tablet Take 12.5 mg by mouth every 6 (six) hours as needed for nausea or vomiting.    . ranitidine (ZANTAC) 150 MG tablet Take 150 mg by mouth as needed.     . rosuvastatin (CRESTOR) 20 MG tablet TAKE 1 TABLET(20 MG) BY MOUTH DAILY 90 tablet 0  . tiZANidine (ZANAFLEX) 2 MG tablet Take 2 mg by mouth every 6 (six) hours as needed for muscle spasms.    . traMADol (ULTRAM) 50 MG tablet TAKE 1 TABLET EVERY 4 HOURS AS NEEDED FOR PAIN 30 tablet 0  . valsartan (DIOVAN) 80 MG tablet TAKE 1 TABLET EVERY MORNING 30 tablet 1  . escitalopram (LEXAPRO) 20 MG tablet Take 1 tablet (20 mg total) by mouth daily. 30 tablet 1   No current facility-administered medications for this visit.     Medication Side Effects:  None  Allergies:  Allergies  Allergen Reactions  . Donepezil Other (See Comments)    Altered mood, aggression, and caused anger  . Duloxetine Hcl     Increased confusion and memory concerns   . Hydrocodone-Acetaminophen Other (See Comments)    "Delirium, confusion, toxic dementia"  . Meloxicam     Pt suspects that this medicine causes fluid on her lungs.   . Memantine Other (See Comments)    Caused severe aggression  . 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 Vicodin [Hydrocodone-Acetaminophen] Other (See Comments)    Delirium, confusion, and toxic dementia    Past Medical History:  Diagnosis Date  . AMI 11/17/2008   Qualifier: Diagnosis of  By: Marland Mcalpine    . Anxiety   . Atherosclerotic peripheral vascular disease ([employer])   . Bacteremia, escherichia coli 04/27/2015  .  Brachial-basilar insufficiency syndrome   . CAD (coronary artery disease)    followed by dr [name].  . Celiac artery stenosis ([place])   . CHF (congestive heart failure) ([place]) 11/14/2017  . Chronic bilateral low back pain without sciatica   . Cognitive impairment   . Colon polyps   . Community acquired pneumonia   . Depression   . Diverticulosis   . Dysuria   . Encephalomalacia   . GAD (generalized anxiety disorder)   . GERD (gastroesophageal reflux disease)   . Glaucoma   . Hearing loss   . Hemorrhoids   . Hypertension   . Hypothyroidism   . Incontinence   . Mixed hyperlipidemia   . Myocardial infarction ([place])   . OSA on CPAP   . Peripheral arterial disease ([place])   . Pneumonitis 04/26/2015  . Pyelonephritis   . Pyelonephritis due to Escherichia coli 04/28/2015  . Sepsis ([place])   . Sinus drainage    took z-pack   finished yesterday  . Spondylosis of lumbar region without myelopathy or radiculopathy   . 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[place])   . Urticaria   . Vertigo, benign positional     Family History  Problem Relation Age of Onset  .  Heart attack Mother   . Heart disease Mother        before age 45  . Diabetes Father   . Heart disease Father   . Hypertension Father   . Hyperlipidemia Father   . Heart attack Father 77  . Heart attack Brother 13  . Cerebral palsy Sister 58  . Congestive Heart Failure Sister 66  . Heart attack Sister 23  . Hypertension Sister 51  . Dementia Sister   . Anxiety disorder Sister   . Anxiety disorder Sister 8  . Heart Problems Sister   . Stroke Sister   . Heart Problems Sister 26  . Heart attack Sister 49  . Colon cancer Brother 24  . Prostate cancer Brother 110  . Hypertension Daughter   . Irritable bowel syndrome Daughter   . Depression Daughter   . Anxiety disorder Daughter     Social History   Socioeconomic History  . Marital status: Divorced    Spouse name: Not on file  . Number of children: Not on file  . Years of education: Not on file  . Highest education level: Not on file  Occupational History  . Not on file  Social Needs  . Financial resource strain: Not on file  . Food insecurity    Worry: Not on file    Inability: Not on file  . Transportation needs    Medical: Not on file    Non-medical: Not on file  Tobacco Use  . Smoking status: Former Smoker    Years: 3.00    Types: Cigarettes    Quit date: 11/27/1981    Years since quitting: 37.3  . Smokeless tobacco: Never Used  Substance and Sexual Activity  . Alcohol use: Not Currently    Alcohol/week: 0.0 standard drinks    Frequency: Never  . Drug use: No  . Sexual activity: Not Currently    Comment: 1st intercourse 53 yo-1 partner  Lifestyle  . Physical activity    Days per week: Not on file    Minutes per session: Not on file  . Stress: Not on file  Relationships  . Social Herbalist on phone: Not on file    Gets together: Not on file  Attends religious service: Not on file    Active member of club or organization: Not on file    Attends meetings of clubs or organizations: Not on  file    Relationship status: Not on file  . Intimate partner violence    Fear of current or ex partner: Not on file    Emotionally abused: Not on file    Physically abused: Not on file    Forced sexual activity: Not on file  Other Topics Concern  . Not on file  Social History Narrative   Social History      Diet? Healthy but too many sweets      Do you drink/eat things with caffeine? Yes occasionally      Marital status?                    D                What year were you married?      Do you live in a house, apartment, assisted living, condo, trailer, etc.? condo      Is it one or more stories? 1      How many persons live in your home? 2      Do you have any pets in your home? (please list) 1 cat      Highest level of education completed? 1 year college      Current or past profession: housewife      Do you exercise?           no                           Type & how often?      Advanced Directives      Do you have a living will? no      Do you have a DNR form?             no                     If not, do you want to discuss one? yes      Do you have signed POA/HPOA for forms? no      Functional Status      Do you have difficulty bathing or dressing yourself? No- gets tired      Do you have difficulty preparing food or eating? No- eats frozen entrees or poor nutrition meals      Do you have difficulty managing your medications? yes      Do you have difficulty managing your finances? yes      Do you have difficulty affording your medications? No- funds running low    Past Medical History, Surgical history, Social history, and Family history were reviewed and updated as appropriate.   Please see review of systems for further details on the patient's review from today.   Objective:   Physical Exam:  There were no vitals taken for this visit.  Physical Exam Neurological:     Mental Status: She is alert and oriented to person, place, and time.      Cranial Nerves: No dysarthria.  Psychiatric:        Attention and Perception: Attention normal.        Mood and Affect: Mood is anxious.        Speech: Speech normal.        Behavior: Behavior is cooperative.  Thought Content: Thought content normal. Thought content is not paranoid or delusional. Thought content does not include homicidal or suicidal ideation. Thought content does not include homicidal or suicidal plan.        Cognition and Memory: Cognition is impaired. Memory is impaired.        Judgment: Judgment normal.     Lab Review:     Component Value Date/Time   NA 140 01/11/2019 1002   K 4.9 01/11/2019 1002   CL 102 01/11/2019 1002   CO2 29 01/11/2019 1002   GLUCOSE 106 (H) 01/11/2019 1002   BUN 24 01/11/2019 1002   CREATININE 0.99 (H) 01/11/2019 1002   CALCIUM 9.2 01/11/2019 1002   PROT 6.5 01/11/2019 1002   ALBUMIN 4.6 12/18/2015 1000   AST 12 01/11/2019 1002   ALT 7 01/11/2019 1002   ALKPHOS 81 12/18/2015 1000   BILITOT 0.7 01/11/2019 1002   GFRNONAA 55 (L) 01/11/2019 1002   GFRAA 64 01/11/2019 1002       Component Value Date/Time   WBC 6.6 01/11/2019 1002   RBC 4.56 01/11/2019 1002   HGB 13.0 01/11/2019 1002   HCT 39.1 01/11/2019 1002   PLT 129 (L) 01/11/2019 1002   MCV 85.7 01/11/2019 1002   MCH 28.5 01/11/2019 1002   MCHC 33.2 01/11/2019 1002   RDW 13.0 01/11/2019 1002   LYMPHSABS 2,383 01/11/2019 1002   MONOABS 0.5 04/27/2015 0310   EOSABS 79 01/11/2019 1002   BASOSABS 40 01/11/2019 1002    No results found for: POCLITH, LITHIUM   No results found for: PHENYTOIN, PHENOBARB, VALPROATE, CBMZ   .res Assessment: Plan:   Discussed potential benefits, risks, and side effects of increasing Lexapro to 20 mg po qd for depression and anxiety since pt has had partial improvement in these s/s with Lexapro 10 mg po qd. Will continue current dose of Depakote for mood stabilization. Continue Xanax prn anxiety. Pt to f/u in 6-8 weeks or sooner if  clinically indicated. Patient advised to contact office with any questions, adverse effects, or acute worsening in signs and symptoms.  Shrita was seen today for follow-up.  Diagnoses and all orders for this visit:  Mood disorder ([place]) -     escitalopram (LEXAPRO) 20 MG tablet; Take 1 tablet (20 mg total) by mouth daily.  Generalized anxiety disorder -     escitalopram (LEXAPRO) 20 MG tablet; Take 1 tablet (20 mg total) by mouth daily. -     ALPRAZolam (XANAX) 0.5 MG tablet; Take 1 tablet (0.5 mg total) by mouth daily as needed for anxiety.    Please see After Visit Summary for patient specific instructions.  Future Appointments  Date Time Provider [place]  03/27/2019  3:00 PM Lorretta Harp, MD [facility]  04/03/2019 11:00 AM Lauree Chandler, NP [facility] None    No orders of the defined types were placed in this encounter.     -------------------------------

## 2019-03-24 ENCOUNTER — Encounter: Payer: Self-pay | Admitting: Psychiatry

## 2019-03-27 ENCOUNTER — Ambulatory Visit (INDEPENDENT_AMBULATORY_CARE_PROVIDER_SITE_OTHER): Payer: Medicare HMO | Admitting: Cardiovascular Disease

## 2019-03-27 ENCOUNTER — Other Ambulatory Visit: Payer: Self-pay

## 2019-03-27 ENCOUNTER — Encounter: Payer: Self-pay | Admitting: Cardiovascular Disease

## 2019-03-27 VITALS — BP 130/80 | HR 83 | Temp 97.3°F | Ht 61.5 in | Wt 211.0 lb

## 2019-03-27 DIAGNOSIS — I10 Essential (primary) hypertension: Secondary | ICD-10-CM

## 2019-03-27 DIAGNOSIS — E782 Mixed hyperlipidemia: Secondary | ICD-10-CM | POA: Diagnosis not present

## 2019-03-27 DIAGNOSIS — Z008 Encounter for other general examination: Secondary | ICD-10-CM

## 2019-03-27 DIAGNOSIS — I739 Peripheral vascular disease, unspecified: Secondary | ICD-10-CM | POA: Diagnosis not present

## 2019-03-27 DIAGNOSIS — G458 Other transient cerebral ischemic attacks and related syndromes: Secondary | ICD-10-CM

## 2019-03-27 NOTE — Assessment & Plan Note (Signed)
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  Deanna [name] follows this by duplex ultrasound.

## 2019-03-27 NOTE — Assessment & Plan Note (Signed)
History of hyperlipidemia on statin therapy with lipid profile performed 01/11/2019 revealing total cholesterol 134, LDL 71 and HDL 35.

## 2019-03-27 NOTE — Progress Notes (Signed)
03/27/2019 [name]   05/11/1941  [id number]  Primary Physician Deanna Chandler, NP Primary Cardiologist: Deanna Harp MD Deanna [name], [place]  HPI:  Deanna [name] is a 78 y.o.   moderately overweight divorced Caucasian female mother of one daughter , Deanna [name]. I last saw in the office 12/06/2017. She was formerly a patient of Dr. Lowella [name]. I am assuming her care. I have the procedures on her in the past as well. Her primary care physician is Dr. Valetta [name]. She has a history of coronary artery disease status post anterior wall myocardial infarction in 1994 treated with LAD angioplasty by Dr. Rollene [name]. She had circumflex intervention the following year. Ultimately she required coronary bypass grafting in 1999 by Dr. Arlyce [name] . 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 Trula [name]. Other problems include to hypertension and hyperlipidemia. She does not smoke. She is not diabetic. She has a strong family history of heart disease. She denies chest pain, shortness of breath or claudication.She had intervention on her femorofemoral crossover graft by Dr. Brabham1/15/19 and is walking better since. She was admitted to the hospital on 11/14/17 for 2 days because of chest pain. She ruled out for myocardial infarction. A chest CT was negative with only small bilateral pleural effusions. BNP was mildly elevated and she was treated with gentle diuresis. A Myoview stress test was nonischemic and low risk. She's had no recurrent symptoms.  Since I saw her a year ago she is done well.  She denies chest pain or shortness of breath.  The vascular surgeons follow her femorofemoral crossover graft and left common carotid to subclavian bypass  graft noninvasively.   Current Meds  Medication Sig  . acetaminophen (TYLENOL) 325 MG tablet Take 325 mg by mouth every 6 (six) hours as needed (for pain).  Marland Kitchen ALPRAZolam (XANAX) 0.5 MG tablet Take 1 tablet (0.5 mg total) by mouth daily as needed for anxiety.  Marland Kitchen amLODipine (NORVASC) 5 MG tablet Take 1 tablet (5 mg total) by mouth daily.  Marland Kitchen aspirin EC 81 MG tablet Take 81 mg by mouth at bedtime.   . brimonidine (ALPHAGAN) 0.2 % ophthalmic solution Place 1 drop into both eyes 2 (two) times daily.  . cholecalciferol (VITAMIN D) 400 units TABS tablet Take 400 Units by mouth daily.  . clopidogrel (PLAVIX) 75 MG tablet Take 1 tablet (75 mg total) by mouth daily.  . divalproex (DEPAKOTE ER) 250 MG 24 hr tablet TAKE 1 TABLET(250 MG) BY MOUTH TWICE DAILY  . dorzolamide (TRUSOPT) 2 % ophthalmic solution Place 1 drop into both eyes 2 (two) times daily.  Marland Kitchen escitalopram (LEXAPRO) 20 MG tablet Take 1 tablet (20 mg total) by mouth daily.  . furosemide (LASIX) 40 MG tablet Take 1 tablet (40 mg total) by mouth daily as needed. Take 1 tablet if you have more than a 2 pound weight gain in 24 hours as needed.  . latanoprost (XALATAN) 0.005 % ophthalmic solution Place 1 drop into both eyes at bedtime.  Marland Kitchen levothyroxine (SYNTHROID, LEVOTHROID) 200 MCG tablet TAKE 1 TABLET(200 MCG) BY MOUTH DAILY BEFORE BREAKFAST  . meclizine (ANTIVERT) 25 MG tablet Take 1 tablet (25 mg total) by mouth as needed for dizziness.  . metoprolol tartrate (LOPRESSOR) 25 MG tablet TAKE 1 TABLET (25 MG TOTAL)  BY MOUTH 2 (TWO) TIMES DAILY.  . mirabegron ER (MYRBETRIQ) 25 MG TB24 tablet Take 25 mg by mouth daily.   . pantoprazole (PROTONIX) 40 MG tablet Take 1 tablet (40 mg total) by mouth daily.  . promethazine (PHENERGAN) 12.5 MG tablet Take 12.5 mg by mouth every 6 (six) hours as needed for nausea or vomiting.  . ranitidine (ZANTAC) 150 MG tablet Take 150 mg by mouth as needed.   . rosuvastatin (CRESTOR) 20 MG tablet TAKE 1 TABLET(20 MG) BY  MOUTH DAILY  . tiZANidine (ZANAFLEX) 2 MG tablet Take 2 mg by mouth every 6 (six) hours as needed for muscle spasms.  . traMADol (ULTRAM) 50 MG tablet TAKE 1 TABLET EVERY 4 HOURS AS NEEDED FOR PAIN  . valsartan (DIOVAN) 80 MG tablet TAKE 1 TABLET EVERY MORNING     Allergies  Allergen Reactions  . Donepezil Other (See Comments)    Altered mood, aggression, and caused anger  . Duloxetine Hcl     Increased confusion and memory concerns   . Hydrocodone-Acetaminophen Other (See Comments)    "Delirium, confusion, toxic dementia"  . Meloxicam     Pt suspects that this medicine causes fluid on her lungs.   . Memantine Other (See Comments)    Caused severe aggression  . 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 Vicodin [Hydrocodone-Acetaminophen] Other (See Comments)    Delirium, confusion, and toxic dementia    Social History   Socioeconomic History  . Marital status: Divorced    Spouse name: Not on file  . Number of children: Not on file  . Years of education: Not on file  . Highest education level: Not on file  Occupational History  . Not on file  Social Needs  . Financial resource strain: Not on file  . Food insecurity    Worry: Not on file    Inability: Not on file  . Transportation needs    Medical: Not on file    Non-medical: Not on file  Tobacco Use  . Smoking status: Former Smoker    Years: 3.00    Types: Cigarettes    Quit date: 11/27/1981    Years since quitting: 37.3  . Smokeless tobacco: Never Used  Substance and Sexual Activity  . Alcohol use: Not Currently    Alcohol/week: 0.0 standard drinks    Frequency: Never  . Drug use: No  . Sexual activity: Not Currently    Comment: 1st intercourse 49 yo-1 partner  Lifestyle  . Physical activity    Days per week: Not on file    Minutes per session: Not on file  . Stress: Not on file  Relationships  . Social Herbalist on phone: Not on file    Gets  together: Not on file    Attends religious service: Not on file    Active member of club or organization: Not on file    Attends meetings of clubs or organizations: Not on file    Relationship status: Not on file  . Intimate partner violence    Fear of current or ex partner: Not on file    Emotionally abused: Not on file    Physically abused: Not on file    Forced sexual activity: Not on file  Other Topics Concern  . Not on file  Social History Narrative   Social History      Diet? Healthy but too many  sweets      Do you drink/eat things with caffeine? Yes occasionally      Marital status?                    D                What year were you married?      Do you live in a house, apartment, assisted living, condo, trailer, etc.? condo      Is it one or more stories? 1      How many persons live in your home? 2      Do you have any pets in your home? (please list) 1 cat      Highest level of education completed? 1 year college      Current or past profession: housewife      Do you exercise?           no                           Type & how often?      Advanced Directives      Do you have a living will? no      Do you have a DNR form?             no                     If not, do you want to discuss one? yes      Do you have signed POA/HPOA for forms? no      Functional Status      Do you have difficulty bathing or dressing yourself? No- gets tired      Do you have difficulty preparing food or eating? No- eats frozen entrees or poor nutrition meals      Do you have difficulty managing your medications? yes      Do you have difficulty managing your finances? yes      Do you have difficulty affording your medications? No- funds running low     Review of Systems: General: negative for chills, fever, night sweats or weight changes.  Cardiovascular: negative for chest pain, dyspnea on exertion, edema, orthopnea, palpitations, paroxysmal nocturnal dyspnea or  shortness of breath Dermatological: negative for rash Respiratory: negative for cough or wheezing Urologic: negative for hematuria Abdominal: negative for nausea, vomiting, diarrhea, bright red blood per rectum, melena, or hematemesis Neurologic: negative for visual changes, syncope, or dizziness All other systems reviewed and are otherwise negative except as noted above.    Blood pressure 130/80, pulse 83, temperature (!) 97.3 F (36.3 C), height 5' 1.5" (1.562 m), weight 211 lb (95.7 kg).  General appearance: alert and no distress Neck: no adenopathy, no JVD, supple, symmetrical, trachea midline, thyroid not enlarged, symmetric, no tenderness/mass/nodules and Bilateral carotid bruits Lungs: clear to auscultation bilaterally Heart: regular rate and rhythm, S1, S2 normal, no murmur, click, rub or gallop Extremities: extremities normal, atraumatic, no cyanosis or edema Pulses: 2+ and symmetric Skin: Skin color, texture, turgor normal. No rashes or lesions Neurologic: Alert and oriented X 3, normal strength and tone. Normal symmetric reflexes. Normal coordination and gait  EKG sinus rhythm at 83 with moderate voltage for LVH, inferior Q waves and nonspecific ST and T wave changes.  I personally reviewed this EKG.  ASSESSMENT AND PLAN:   Coronary artery disease status post CABG History of CAD status  post remote anterior myocardial infarction 1994 treated with LAD angioplasty by Dr. Rollene [name].  She had circumflex intervention the following year and ultimately required CABG in 1999 by Dr. Arlyce [name].  She is done well since from a heart point of view.  She did have a nonischemic Myoview when admitted with chest pain 11/14/2017.  She denies chest pain or shortness of breath at this time.  Essential hypertension History of essential hypertension with blood pressure measured today 130/80.  She is on amlodipine and metoprolol, and valsartan.  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 Trula [name] after angiography performed by myself on 11/15/2011 revealed a high-grade calcified proximal left subclavian artery stenosis not amenable to percutaneous intervention.  He follows this by duplex ultrasound.  Hyperlipidemia History of hyperlipidemia on statin therapy with lipid profile performed 01/11/2019 revealing total cholesterol 134, LDL 71 and HDL 35.  Peripheral arterial disease (HCC) History of peripheral arterial disease status post femorofemoral crossover grafting remotely with re-intervention by Dr. Trula [name] 08/22/2017 resulting in improvement in ambulation.  He follows this by duplex ultrasound.      Deanna Harp MD FACP,FACC,FAHA, [facility] 03/27/2019 3:23 PM

## 2019-03-27 NOTE — Patient Instructions (Signed)
Medication Instructions:  Your physician recommends that you continue on your current medications as directed. Please refer to the Current Medication list given to you today.  If you need a refill on your cardiac medications before your next appointment, please call your pharmacy.   Lab work: none If you have labs (blood work) drawn today and your tests are completely normal, you will receive your results only by: . MyChart Message (if you have MyChart) OR . A paper copy in the mail If you have any lab test that is abnormal or we need to change your treatment, we will call you to review the results.  Testing/Procedures: none  Follow-Up: At [facility], you and your health needs are our priority.  As part of our continuing mission to provide you with exceptional heart care, we have created designated Provider Care Teams.  These Care Teams include your primary Cardiologist (physician) and Advanced Practice Providers (APPs -  Physician Assistants and Nurse Practitioners) who all work together to provide you with the care you need, when you need it. You will need a follow up appointment in 12 months with Dr. Jonathan Berry.  Please call our office 2 months in advance to schedule this/each appointment.     

## 2019-03-27 NOTE — Assessment & Plan Note (Signed)
History of peripheral arterial disease status post femorofemoral crossover grafting remotely with re-intervention by Dr. Trula Slade 08/22/2017 resulting in improvement in ambulation.  He follows this by duplex ultrasound.

## 2019-03-27 NOTE — Progress Notes (Signed)
EKG

## 2019-03-27 NOTE — Assessment & Plan Note (Signed)
History of CAD status post remote anterior myocardial infarction 1994 treated with LAD angioplasty by Dr. [name].  She had circumflex intervention the following year and ultimately required CABG in 1999 by Dr. Arlyce Dice.  She is done well since from a heart point of view.  She did have a nonischemic Myoview when admitted with chest pain 11/14/2017.  She denies chest pain or shortness of breath at this time.

## 2019-03-27 NOTE — Assessment & Plan Note (Signed)
History of essential hypertension with blood pressure measured today 130/80.  She is on amlodipine and metoprolol, and valsartan.

## 2019-04-03 ENCOUNTER — Other Ambulatory Visit: Payer: Self-pay | Admitting: Cardiovascular Disease

## 2019-04-03 ENCOUNTER — Other Ambulatory Visit: Payer: Self-pay | Admitting: Nurse Practitioner

## 2019-04-03 ENCOUNTER — Ambulatory Visit: Payer: Medicare HMO | Admitting: Nurse Practitioner

## 2019-04-03 DIAGNOSIS — I10 Essential (primary) hypertension: Secondary | ICD-10-CM

## 2019-04-04 ENCOUNTER — Other Ambulatory Visit: Payer: Self-pay | Admitting: *Deleted

## 2019-04-04 MED ORDER — TRAMADOL HCL 50 MG PO TABS: ORAL_TABLET | ORAL | 0 refills | Status: DC

## 2019-04-04 NOTE — Telephone Encounter (Signed)
[facility] LR Epic: 03/01/2019 Pended Rx and sent to [facility] for approval.

## 2019-04-08 ENCOUNTER — Other Ambulatory Visit: Payer: Self-pay

## 2019-04-08 ENCOUNTER — Encounter: Payer: Self-pay | Admitting: Nurse Practitioner

## 2019-04-08 ENCOUNTER — Ambulatory Visit (INDEPENDENT_AMBULATORY_CARE_PROVIDER_SITE_OTHER): Payer: Medicare HMO | Admitting: Nurse Practitioner

## 2019-04-08 VITALS — BP 130/80 | HR 78 | Temp 98.0°F | Ht 61.5 in | Wt 199.0 lb

## 2019-04-08 DIAGNOSIS — F39 Unspecified mood [affective] disorder: Secondary | ICD-10-CM | POA: Diagnosis not present

## 2019-04-08 DIAGNOSIS — E039 Hypothyroidism, unspecified: Secondary | ICD-10-CM

## 2019-04-08 DIAGNOSIS — R5381 Other malaise: Secondary | ICD-10-CM

## 2019-04-08 DIAGNOSIS — G8929 Other chronic pain: Secondary | ICD-10-CM | POA: Diagnosis not present

## 2019-04-08 DIAGNOSIS — F331 Major depressive disorder, recurrent, moderate: Secondary | ICD-10-CM | POA: Diagnosis not present

## 2019-04-08 DIAGNOSIS — R413 Other amnesia: Secondary | ICD-10-CM

## 2019-04-08 DIAGNOSIS — S069X9D Unspecified intracranial injury with loss of consciousness of unspecified duration, subsequent encounter: Secondary | ICD-10-CM | POA: Diagnosis not present

## 2019-04-08 DIAGNOSIS — I10 Essential (primary) hypertension: Secondary | ICD-10-CM

## 2019-04-08 DIAGNOSIS — Z23 Encounter for immunization: Secondary | ICD-10-CM | POA: Diagnosis not present

## 2019-04-08 MED ORDER — TIZANIDINE HCL 2 MG PO TABS: 2.0000 mg | ORAL_TABLET | Freq: Four times a day (QID) | ORAL | 5 refills | Status: DC | PRN

## 2019-04-08 MED ORDER — TRAMADOL HCL 50 MG PO TABS: ORAL_TABLET | ORAL | 0 refills | Status: DC

## 2019-04-08 NOTE — Patient Instructions (Addendum)
Update numbers on file.  To call neurologist and get follow up- you are due for this.

## 2019-04-08 NOTE — Progress Notes (Signed)
Careteam: Patient Care Team: Lauree Chandler, NP as PCP - General (Geriatric Medicine) Lorretta Harp, MD as PCP - Cardiology (Cardiology) Irene Shipper, MD as Consulting Physician (Gastroenterology) Frederik Pear, MD as Consulting Physician (Orthopedic Surgery) Fontaine, Belinda Block, MD as Consulting Physician (Gynecology) Rutherford Guys, MD as Consulting Physician (Ophthalmology) Bjorn Loser, MD as Consulting Physician (Urology) Serafina Mitchell, MD as Consulting Physician (Vascular Surgery) Cameron Sprang, MD as Consulting Physician (Neurology) Desma Maxim, MD as Referring Physician (Ophthalmology) Thayer Headings, PMHNP as Nurse Practitioner (Psychiatry) Cameron Sprang, MD as Consulting Physician (Neurology)  Advanced Directive information Does Patient Have a Medical Advance Directive?: No, Would patient like information on creating a medical advance directive?: Yes (MAU/Ambulatory/Procedural Areas - Information given)(Patient will get HCPOA tomorrow)  Allergies  Allergen Reactions  . Donepezil Other (See Comments)    Altered mood, aggression, and caused anger  . Duloxetine Hcl     Increased confusion and memory concerns   . Hydrocodone-Acetaminophen Other (See Comments)    "Delirium, confusion, toxic dementia"  . Meloxicam     Pt suspects that this medicine causes fluid on her lungs.   . Memantine Other (See Comments)    Caused severe aggression  . 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 Vicodin [Hydrocodone-Acetaminophen] Other (See Comments)    Delirium, confusion, and toxic dementia    Chief Complaint  Patient presents with  . Medical Management of Chronic Issues    4 month follow-up, patient c/o shakiness. Neck pain, headaches daily, decreased hearing, and ongoing back pain. Here with daughter    . Medication Refill    Refill Zanafelx and Tramadol ([facility])     HPI: Patient is a 78 y.o.  female seen in the office today for routine follow up.   Anxiety/mood disorder- pt reports she is doing well. She continues to see psychiatrist - talk therapy is not good for her due to memory issue. Daughter reports she is sleeping a lot. Having a lot of time getting motivation.  Sleeping later, going to bed earlier. "does not have the energy"  Mild memory impairment/TBI- MMSE 29/30 last month.   She is having progressive tremors.  Daughter having to change her utentals so she can eat. Having a hard time with zips. She also is stating she is having a hard time remembering how to do things.  Unable to text due to tremors.  Went to neuropsychologist testing at one practice but the doctor left the practice. Daughter is very frustrated with getting the proper attention from a neurologist and follow up.  Tremor- reduced depakote but this has not improved tremor.   Stressed out with all the specialist she is seeing- recently saw cardiologist, dentist, psychiatrist.   Jenita Seashore line not good to call on- daughter frustrated and states everyone needs to be calling daughter not the patient due to memory impairment.   Ongoing back pain- back and neck always hurting. Daughter has to remind her to take medication.   Hypertension- elevated, taking norvasc 5 mg daily, metoprolol, and valsartan   Review of Systems:  Review of Systems  Unable to perform ROS: Dementia    Past Medical History:  Diagnosis Date  . AMI 11/17/2008   Qualifier: Diagnosis of  By: Marland Mcalpine    . Anxiety   . Atherosclerotic peripheral vascular disease ([place])   . Bacteremia, escherichia coli 04/27/2015  . Brachial-basilar insufficiency syndrome   .  CAD (coronary artery disease)    followed by dr [name].  . Celiac artery stenosis ([place])   . CHF (congestive heart failure) ([place]) 11/14/2017  . Chronic bilateral low back pain without sciatica   . Cognitive impairment   . Colon polyps   . Community acquired pneumonia    . Depression   . Diverticulosis   . Dysuria   . Encephalomalacia   . GAD (generalized anxiety disorder)   . GERD (gastroesophageal reflux disease)   . Glaucoma   . Hearing loss   . Hemorrhoids   . Hypertension   . Hypothyroidism   . Incontinence   . Mixed hyperlipidemia   . Myocardial infarction ([place])   . OSA on CPAP   . Peripheral arterial disease ([place])   . Pneumonitis 04/26/2015  . Pyelonephritis   . Pyelonephritis due to Escherichia coli 04/28/2015  . Sepsis ([place])   . Sinus drainage    took z-pack   finished yesterday  . Spondylosis of lumbar region without myelopathy or radiculopathy   . 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[place])   . Urticaria   . Vertigo, benign positional    Past Surgical History:  Procedure Laterality Date  . ABDOMINAL AORTOGRAM W/LOWER EXTREMITY N/A 08/22/2017   Procedure: ABDOMINAL AORTOGRAM W/LOWER EXTREMITY;  Surgeon: Serafina Mitchell, MD;  Location: [facility];  Service: Cardiovascular;  Laterality: N/A;  . BILATERAL UPPER EXTREMITY ANGIOGRAM N/A 09/11/2012   Procedure: BILATERAL UPPER EXTREMITY ANGIOGRAM;  Surgeon: Serafina Mitchell, MD;  Location: [facility] CATH LAB;  Service: Cardiovascular;  Laterality: N/A;  . carotid duplex doppler Bilateral 09/03/2012, 11/03/2011   Evidence of 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 patent left common carotid- subclavian artery bypass with turbulent flow noted at the anastomosis with velocities of 295 cm/s  . CAROTID-SUBCLAVIAN BYPASS GRAFT  12/15/2011   Procedure: BYPASS GRAFT CAROTID-SUBCLAVIAN;  Surgeon: Serafina Mitchell, MD;  Location: [facility] OR;  Service: Vascular;  Laterality: Left;  Left Carotid subclavian bypass  . CORONARY ANGIOPLASTY    . CORONARY ARTERY BYPASS GRAFT    . DOPPLER ECHOCARDIOGRAPHY  05/27/2010, 09/17/2008   Mild Proximal septal thickening is noted. Left  ventricular systolic functions is normal ejection fraction =>55%. the aortic valve appears to be mildly sclerotic   . fem-fem bypass graft  1999  . holter monitor  01/21/2008   The predominant rhythm was normal sinus rhythm. Minimum heartrate of 63 bpm at +01:00, maximum heartrate of 105 bpm at + 10:35; and the average heartrate of 75 bpm. Ventricular ectopic activity totaled 1270: Multifocal; 866-PVC's and 404-VEs             . JOINT REPLACEMENT     Left knee  . LEFT HEART CATHETERIZATION WITH CORONARY/GRAFT ANGIOGRAM N/A 12/21/2011   Procedure: LEFT HEART CATHETERIZATION WITH Beatrix Fetters;  Surgeon: Leonie Man, MD;  Location: [facility] CATH LAB;  Service: Cardiovascular;  Laterality: N/A;  . NM MYOCAR PERF EJECTION FRACTION  09/22/2009, 07/03/2007   the post stress myocardial perfusion images show a normal pattern of perfusion is all regions. The post-stress ejection fraction is 68 %. no significant wall motion abnormalities noted. This is a low risk scan.  Marland Kitchen PERIPHERAL VASCULAR INTERVENTION  08/22/2017   Procedure: PERIPHERAL VASCULAR INTERVENTION;  Surgeon: Serafina Mitchell, MD;  Location: [facility] LAB;  Service: Cardiovascular;;  Fem/Fem Graft  . REPLACEMENT TOTAL KNEE  05-2011  .  UNILATERAL UPPER EXTREMEITY ANGIOGRAM Left 11/15/2011   Procedure: UNILATERAL UPPER EXTREMEITY ANGIOGRAM;  Surgeon: Lorretta Harp, MD;  Location: [facility] CATH LAB;  Service: Cardiovascular;  Laterality: Left;   Social History:   reports that she quit smoking about 37 years ago. Her smoking use included cigarettes. She quit after 3.00 years of use. She has never used smokeless tobacco. She reports previous alcohol use. She reports that she does not use drugs.  Family History  Problem Relation Age of Onset  . Heart attack Mother   . Heart disease Mother        before age 93  . Diabetes Father   . Heart disease Father   . Hypertension Father   . Hyperlipidemia Father   . Heart attack Father 74  .  Heart attack Brother 44  . Cerebral palsy Sister 44  . Congestive Heart Failure Sister 41  . Heart attack Sister 62  . Hypertension Sister 57  . Dementia Sister   . Anxiety disorder Sister   . Anxiety disorder Sister 51  . Heart Problems Sister   . Stroke Sister   . Heart Problems Sister 69  . Heart attack Sister 106  . Colon cancer Brother 66  . Prostate cancer Brother 72  . Hypertension Daughter   . Irritable bowel syndrome Daughter   . Depression Daughter   . Anxiety disorder Daughter     Medications: Patient's Medications  New Prescriptions   No medications on file  Previous Medications   ACETAMINOPHEN (TYLENOL) 325 MG TABLET    1 by mouth in the morning and 1 by mouth in the pm.   ALPRAZOLAM (XANAX) 0.5 MG TABLET    Take 1 tablet (0.5 mg total) by mouth daily as needed for anxiety.   AMLODIPINE (NORVASC) 5 MG TABLET    TAKE 1 TABLET EVERY DAY   ASPIRIN EC 81 MG TABLET    Take 81 mg by mouth at bedtime.    BRIMONIDINE (ALPHAGAN) 0.2 % OPHTHALMIC SOLUTION    Place 1 drop into both eyes 2 (two) times daily.   CHOLECALCIFEROL (VITAMIN D) 400 UNITS TABS TABLET    Take 400 Units by mouth daily.   CLOPIDOGREL (PLAVIX) 75 MG TABLET    TAKE 1 TABLET (75 MG TOTAL) BY MOUTH DAILY.   DIVALPROEX (DEPAKOTE ER) 250 MG 24 HR TABLET    TAKE 1 TABLET(250 MG) BY MOUTH TWICE DAILY   DORZOLAMIDE (TRUSOPT) 2 % OPHTHALMIC SOLUTION    Place 1 drop into both eyes 2 (two) times daily.   ESCITALOPRAM (LEXAPRO) 20 MG TABLET    Take 1 tablet (20 mg total) by mouth daily.   FUROSEMIDE (LASIX) 40 MG TABLET    Take 1 tablet (40 mg total) by mouth daily as needed. Take 1 tablet if you have more than a 2 pound weight gain in 24 hours as needed.   LATANOPROST (XALATAN) 0.005 % OPHTHALMIC SOLUTION    Place 1 drop into both eyes at bedtime.   LEVOTHYROXINE (SYNTHROID, LEVOTHROID) 200 MCG TABLET    TAKE 1 TABLET(200 MCG) BY MOUTH DAILY BEFORE BREAKFAST   MECLIZINE (ANTIVERT) 25 MG TABLET    Take 1 tablet (25 mg  total) by mouth as needed for dizziness.   METOPROLOL TARTRATE (LOPRESSOR) 25 MG TABLET    Take 12.5 mg by mouth 2 (two) times daily.   MIRABEGRON ER (MYRBETRIQ) 25 MG TB24 TABLET    Take 25 mg by mouth daily.    PANTOPRAZOLE (PROTONIX) 40  MG TABLET    Take 1 tablet (40 mg total) by mouth daily.   PROMETHAZINE (PHENERGAN) 12.5 MG TABLET    Take 12.5 mg by mouth every 6 (six) hours as needed for nausea or vomiting.   ROSUVASTATIN (CRESTOR) 20 MG TABLET    TAKE 1 TABLET(20 MG) BY MOUTH DAILY   TIZANIDINE (ZANAFLEX) 2 MG TABLET    Take 2 mg by mouth every 6 (six) hours as needed for muscle spasms.   TRAMADOL (ULTRAM) 50 MG TABLET    TAKE 1 TABLET EVERY 4 HOURS AS NEEDED FOR PAIN   VALSARTAN (DIOVAN) 80 MG TABLET    TAKE 1 TABLET EVERY MORNING  Modified Medications   No medications on file  Discontinued Medications   METOPROLOL TARTRATE (LOPRESSOR) 25 MG TABLET    TAKE 1 TABLET TWICE DAILY   RANITIDINE (ZANTAC) 150 MG TABLET    Take 150 mg by mouth as needed.     Physical Exam:  Vitals:   04/08/19 1532 04/08/19 1600  BP: (!) 156/88 130/80  Pulse: 78   Temp: 98 F (36.7 C)   TempSrc: Temporal   SpO2: 95%   Weight: 199 lb (90.3 kg)   Height: 5' 1.5" (1.562 m)    Body mass index is 36.99 kg/m. Wt Readings from Last 3 Encounters:  04/08/19 199 lb (90.3 kg)  03/27/19 211 lb (95.7 kg)  06/25/18 211 lb 11.2 oz (96 kg)    Physical Exam Constitutional:      General: She is not in acute distress.    Appearance: She is well-developed. She is not diaphoretic.  HENT:     Head: Normocephalic and atraumatic.     Mouth/Throat:     Pharynx: No oropharyngeal exudate.  Eyes:     Conjunctiva/sclera: Conjunctivae normal.     Pupils: Pupils are equal, round, and reactive to light.  Neck:     Musculoskeletal: Normal range of motion and neck supple.  Cardiovascular:     Rate and Rhythm: Normal rate and regular rhythm.     Heart sounds: Normal heart sounds.  Pulmonary:     Effort: Pulmonary  effort is normal.     Breath sounds: Normal breath sounds.  Abdominal:     General: Bowel sounds are normal.     Palpations: Abdomen is soft.  Musculoskeletal:        General: No tenderness.  Skin:    General: Skin is warm and dry.  Neurological:     Mental Status: She is alert.     Motor: Tremor (fine) present. No weakness.     Gait: Gait normal.     Deep Tendon Reflexes: Reflexes normal.  Psychiatric:        Mood and Affect: Mood normal.        Behavior: Behavior normal.     Labs reviewed: Basic Metabolic Panel: Recent Labs    01/11/19 1002  NA 140  K 4.9  CL 102  CO2 29  GLUCOSE 106*  BUN 24  CREATININE 0.99*  CALCIUM 9.2  TSH 4.43   Liver Function Tests: Recent Labs    01/11/19 1002  AST 12  ALT 7  BILITOT 0.7  PROT 6.5   No results for input(s): LIPASE, AMYLASE in the last 8760 hours. No results for input(s): AMMONIA in the last 8760 hours. CBC: Recent Labs    01/11/19 1002  WBC 6.6  NEUTROABS 3,656  HGB 13.0  HCT 39.1  MCV 85.7  PLT 129*   Lipid  Panel: Recent Labs    01/11/19 1002  CHOL 134  HDL 35*  LDLCALC 71  TRIG 211*  CHOLHDL 3.8   TSH: Recent Labs    01/11/19 1002  TSH 4.43   A1C: Lab Results  Component Value Date   HGBA1C 5.6 01/11/2019     Assessment/Plan 1. Need for influenza vaccination - Flu Vaccine QUAD High Dose(Fluad)  2. Mood disorder ([place]) Stable, Continues to follow up with psych services, continues on depakote ER. daughter does not feel like therapy is beneficial due to progressive memory loss.  - AMB Referral to [place] [facility]  3. Essential hypertension Improved on recheck. Will continue current regimen with dietary modifications.  4. Acquired hypothyroidism -TSH stable in June. Continue current synthroid 200 mcg daily  5. Physical deconditioning -has lack of motivation, likely due to depression and progressive dementia causing increased physical deconditioning. Educated to continue  encouraging pt to increase activity as tolerates.  - AMB Referral to [place] [facility]  6. Other chronic pain -encouraged pill box and reminders from family for her to take medication.  - tiZANidine (ZANAFLEX) 2 MG tablet; Take 1 tablet (2 mg total) by mouth every 6 (six) hours as needed for muscle spasms.  Dispense: 30 tablet; Refill: 5 - traMADol (ULTRAM) 50 MG tablet; TAKE 1 TABLET EVERY 4 HOURS AS NEEDED FOR PAIN  Dispense: 30 tablet; Refill: 0  7. Moderate episode of recurrent major depressive disorder ([place]) Ongoing, continues on follow up with psych services. Continues  - AMB Referral to [place] [facility]  8. Traumatic brain injury with loss of consciousness, subsequent encounter -ongoing difficulties with memory and progressive decline likely due to TBI with dementia.  - AMB Referral to [place] [facility] for help with caregivers  9. Memory loss -progressive decline, likely needs more imagining and with increase in tremor. She is overdue for follow up with neurologist and since she is planning to see Dr Delice Lesch will defer imagining to neurologist.  -could not tolerate namenda or aricept due to agitation. Daughter is very stressed out with being her primary caregiver as she also has to work full time. Information provided for caregiver. Discussed disease progression with her today. Needs more education on this and support.  - AMB Referral to [place] [facility]  10. Tremor -progressive tremor noted by daughter, she has faint tremor today, states it effects her ADLS has reduced Depakote thinking this could be the cause but no changes -to follow up with neurology as this has worsened.   Next appt: 6 weeks.  [name]. [place], [facility] [phone]

## 2019-04-09 ENCOUNTER — Ambulatory Visit: Payer: Medicare HMO | Admitting: Nurse Practitioner

## 2019-04-10 ENCOUNTER — Other Ambulatory Visit: Payer: Self-pay | Admitting: *Deleted

## 2019-04-10 NOTE — Patient Outreach (Signed)
[facility] Care Management  04/10/2019  Deanna [name] 1940-08-23 [id number]    Referral received: 04/09/2019 Initial Outreach: 04/10/2019   RN spoke with pt today and introduced the [facility] program and purpose for today's call. Pt very receptive and provided some information however requested RN speak with her daughter Deanna [name]). Daughter reports all of the current issues related to what's going on with the pt and herself however not immediate with any medical issues more psychological stress due to her overwhelming state as a caregiver for the pt. States several topic related to the following:  1.  Ex husband going to court attempting to decrease the monthly expenses pt is receiving that would put a strain on the pt and caregiver if this occurs. RN discussed legal representative that may be offered to senior resources.  2.  Daughter feels she needs to work but unable due to the care the pt currently needs. States pt has a history of a head injury that caused malfunction of the frontal lobe so pt is unable with her emotions at times and can be very un-rational.  3.  States both the pt and herself have psychiatrist and continues to have counseling but feel the pt needs more conversation with another person for ongoing therapy.  RN encouraged additional counseling session thru this difficult time with their psychiatrists.  RN discuss a high level of care due to the limited finance coming up with pt's monthly salary (ex-husband). Discussed different levels of care and focused on possible assisted living. Daughter states she has had this conversation at the provider's office recently. RN further discussed the different levels of care based upon the report today on what the pt may need with 24 hr assistance. Daughter provider some information to get started and RN offered to make a referral with [facility] social worker to assist further. Due to the overwhelming strain on the caregiver RN  offered immediate resources ([employer] numbers) and a referral for [facility] social worker will be submitted for ongoing community resources as needed. Daughter very Patent attorney. RN also inquired on any medical or nursing issues concerning pt at this time. Daughter indicated no medical issues as pt was stable with all her medical issues and labs were recent obtain that were "great". No needs for RN case management as this time. Offered to enroll via prevention measures and follow up accordingly however due to the caregiver's overwhelming scheduled with court and paperwork declined but again very appreciative and receptive to the referral for social work to get involved with available resources. Strongly encouraged daughter to consider respite services if available to relieve some of her stress as she assist pt with managing her affairs. Daughter is aware of RN contact number and to call the [facility] nurse hotline if any acute issues arise. Offered to send [facility] packet for additional information (agreed). No other issues to address at this time.   Daughter aware pt's provider will be notified of pt's disposition with [facility] services at this time. This discipline with close.  Raina Mina, RN Care Management Coordinator [place] [facility] [phone]

## 2019-05-01 ENCOUNTER — Telehealth: Payer: Self-pay | Admitting: *Deleted

## 2019-05-01 ENCOUNTER — Telehealth: Payer: Self-pay | Admitting: Neurology

## 2019-05-01 DIAGNOSIS — H9193 Unspecified hearing loss, bilateral: Secondary | ICD-10-CM

## 2019-05-01 NOTE — Telephone Encounter (Signed)
[name], daughter called requesting a referral to be placed to a hearing Dr. Parks Ranger specializes in dementia patients. Wants patient's hearing checked and possible hearing aides. Please Advise.

## 2019-05-01 NOTE — Telephone Encounter (Signed)
Patient's daughter called regarding her mother Deanna [name]. She said that she is very concerned about her due to her Legs and Arms shaking and will not stop. She also said things are changing so much with her that she cannot keep up. She is requesting that her mom follow up with Dr. Delice Lesch. The only appointments available right now are Virtual. Please Advise. Thanks

## 2019-05-01 NOTE — Telephone Encounter (Signed)
Daughter notified and stated that she will call the insurance company to see who is covered.

## 2019-05-01 NOTE — Telephone Encounter (Signed)
She is also having Mental Changes as well. Thanks

## 2019-05-01 NOTE — Telephone Encounter (Signed)
LMOM to return call.

## 2019-05-01 NOTE — Telephone Encounter (Signed)
Pls let her know that we will put her on a cancellation list (so far I don't see anything open in the next 2 weeks). The tremors may be due to Depakote, I see her psychiatrist reduced to 1 tab twice a day, pls have her reduce further to 1 tab at bedtime and see if this helps with the shaking.

## 2019-05-01 NOTE — Telephone Encounter (Signed)
Notified pt daughter with cancellation list and with Dr. Delice Lesch instructions on Rx.Depakote.

## 2019-05-01 NOTE — Telephone Encounter (Signed)
Daughter stated that her mother doesn't have problems with earwax and doesn't need to be seen by [place] and then by a hearing Dr.  Lenon Ahmadi that she will call the patient's insurance company and see who is in network and call and make an appointment.

## 2019-05-01 NOTE — Telephone Encounter (Signed)
I am not sure if any "specialize" in dementia but they have experience with them due to the nature of memory loss happening in the elderly and the elderly are the ones that need hearing aids most of the time.  Has she had her ears looked at recent? I do not think we looked at them at last visit, she can always make appt to make sure they arent clogged up- if they are clogged up they will require them to be flushed prior to evaluation

## 2019-05-03 ENCOUNTER — Other Ambulatory Visit: Payer: Self-pay | Admitting: Nurse Practitioner

## 2019-05-03 DIAGNOSIS — K219 Gastro-esophageal reflux disease without esophagitis: Secondary | ICD-10-CM

## 2019-05-06 ENCOUNTER — Other Ambulatory Visit: Payer: Self-pay | Admitting: *Deleted

## 2019-05-06 DIAGNOSIS — G8929 Other chronic pain: Secondary | ICD-10-CM

## 2019-05-06 NOTE — Telephone Encounter (Signed)
Received Fax refill from [facility] LR: 04/08/2019 Pended Rx and sent to [facility] for approval.

## 2019-05-07 ENCOUNTER — Other Ambulatory Visit: Payer: Self-pay | Admitting: *Deleted

## 2019-05-07 DIAGNOSIS — G8929 Other chronic pain: Secondary | ICD-10-CM

## 2019-05-07 MED ORDER — TRAMADOL HCL 50 MG PO TABS: ORAL_TABLET | ORAL | 0 refills | Status: DC

## 2019-05-07 NOTE — Telephone Encounter (Signed)
[facility]: 04/08/2019 Pended Rx and sent to [facility] for approval.

## 2019-05-16 ENCOUNTER — Other Ambulatory Visit: Payer: Self-pay | Admitting: Nurse Practitioner

## 2019-05-16 DIAGNOSIS — E785 Hyperlipidemia, unspecified: Secondary | ICD-10-CM

## 2019-05-20 ENCOUNTER — Ambulatory Visit: Payer: Medicare HMO | Admitting: Nurse Practitioner

## 2019-05-31 ENCOUNTER — Other Ambulatory Visit: Payer: Self-pay | Admitting: Nurse Practitioner

## 2019-06-12 ENCOUNTER — Other Ambulatory Visit: Payer: Self-pay | Admitting: Psychiatry

## 2019-06-12 DIAGNOSIS — F39 Unspecified mood [affective] disorder: Secondary | ICD-10-CM

## 2019-06-25 ENCOUNTER — Other Ambulatory Visit: Payer: Self-pay | Admitting: Psychiatry

## 2019-06-25 DIAGNOSIS — F411 Generalized anxiety disorder: Secondary | ICD-10-CM

## 2019-06-25 DIAGNOSIS — F39 Unspecified mood [affective] disorder: Secondary | ICD-10-CM

## 2019-06-26 NOTE — Telephone Encounter (Signed)
Was due back in Sept.

## 2019-07-01 ENCOUNTER — Other Ambulatory Visit: Payer: Self-pay

## 2019-07-01 ENCOUNTER — Ambulatory Visit (INDEPENDENT_AMBULATORY_CARE_PROVIDER_SITE_OTHER): Payer: Medicare HMO | Admitting: Family

## 2019-07-01 ENCOUNTER — Encounter: Payer: Self-pay | Admitting: Family

## 2019-07-01 DIAGNOSIS — G8929 Other chronic pain: Secondary | ICD-10-CM

## 2019-07-01 MED ORDER — TRAMADOL HCL 50 MG PO TABS: ORAL_TABLET | ORAL | 0 refills | Status: DC

## 2019-07-01 MED ORDER — TIZANIDINE HCL 2 MG PO TABS: 2.0000 mg | ORAL_TABLET | Freq: Four times a day (QID) | ORAL | 5 refills | Status: DC | PRN

## 2019-07-01 NOTE — Progress Notes (Signed)
This service is provided via telemedicine  No vital signs collected/recorded due to the encounter was a telemedicine visit.   Location of patient (ex: home, work):  Home   Patient consents to a telephone visit:  Yes  Location of the provider (ex: office, home): Office   Name of any referring provider:  Sherrie Mustache, NP  Names of all persons participating in the telemedicine service and their role in the encounter: [name], NP, Ruthell Rummage CMA, Clyde Canterbury    Time spent on call:  Ruthell Rummage CMA spent  21 minutes  on phone   Location:      Place of Service:    Provider:   FNP-C  Lauree Chandler, NP  Patient Care Team: Lauree Chandler, NP as PCP - General (Geriatric Medicine) Lorretta Harp, MD as PCP - Cardiology (Cardiology) Irene Shipper, MD as Consulting Physician (Gastroenterology) Frederik Pear, MD as Consulting Physician (Orthopedic Surgery) Fontaine, Belinda Block, MD as Consulting Physician (Gynecology) Rutherford Guys, MD as Consulting Physician (Ophthalmology) Bjorn Loser, MD as Consulting Physician (Urology) Serafina Mitchell, MD as Consulting Physician (Vascular Surgery) Cameron Sprang, MD as Consulting Physician (Neurology) Desma Maxim, MD as Referring Physician (Ophthalmology) Thayer Headings, PMHNP as Nurse Practitioner (Psychiatry) Cameron Sprang, MD as Consulting Physician (Neurology) Estill Dooms as Social Worker  Extended Emergency Contact Information Primary Emergency Contact: [place], [place] [address] [address]itter of [place] Phone: [phone] Mobile Phone: [phone] Relation: Daughter  Code Status:  Full Code  Goals of care: Advanced Directive information Advanced Directives 07/01/2019  Does Patient Have a Medical Advance Directive? No  Type of Advance Directive -  Does patient want to make changes to medical advance directive? -  Would patient like information on  creating a medical advance directive? No - Patient declined  Pre-existing out of facility DNR order (yellow form or pink MOST form) -     Chief Complaint  Patient presents with  . Acute Visit    Right hip pain patient states she has been having pain for a long, Patient states she can not get another shot until 12/15     HPI:  Pt is a 78 y.o. female seen today for an acute visit for evaluation of Right hip pain patient states she has been having pain for a long.she usually gets injection to the hip which helps for a while then wears off. Next injection not due until  07/23/2019.she takes Tramadol 50 mg tablet at least twice day that seems to help with the pain.   Past Medical History:  Diagnosis Date  . AMI 11/17/2008   Qualifier: Diagnosis of  By: Marland Mcalpine    . Anxiety   . Atherosclerotic peripheral vascular disease ([place])   . Bacteremia, escherichia coli 04/27/2015  . Brachial-basilar insufficiency syndrome   . CAD (coronary artery disease)    followed by dr [name].  . Celiac artery stenosis ([place])   . CHF (congestive heart failure) ([place]) 11/14/2017  . Chronic bilateral low back pain without sciatica   . Cognitive impairment   . Colon polyps   . Community acquired pneumonia   . Depression   . Diverticulosis   . Dysuria   . Encephalomalacia   . GAD (generalized anxiety disorder)   . GERD (gastroesophageal reflux disease)   . Glaucoma   . Hearing loss   . Hemorrhoids   . Hypertension   . Hypothyroidism   .  Incontinence   . Mixed hyperlipidemia   . Myocardial infarction ([place])   . OSA on CPAP   . Peripheral arterial disease ([place])   . Pneumonitis 04/26/2015  . Pyelonephritis   . Pyelonephritis due to Escherichia coli 04/28/2015  . Sepsis ([place])   . Sinus drainage    took z-pack   finished yesterday  . Spondylosis of lumbar region without myelopathy or radiculopathy   . 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[place])   . Urticaria   . Vertigo, benign positional    Past  Surgical History:  Procedure Laterality Date  . ABDOMINAL AORTOGRAM W/LOWER EXTREMITY N/A 08/22/2017   Procedure: ABDOMINAL AORTOGRAM W/LOWER EXTREMITY;  Surgeon: Serafina Mitchell, MD;  Location: [place] [facility];  Service: Cardiovascular;  Laterality: N/A;  . BILATERAL UPPER EXTREMITY ANGIOGRAM N/A 09/11/2012   Procedure: BILATERAL UPPER EXTREMITY ANGIOGRAM;  Surgeon: Serafina Mitchell, MD;  Location: [facility] CATH LAB;  Service: Cardiovascular;  Laterality: N/A;  . carotid duplex doppler Bilateral 09/03/2012, 11/03/2011   Evidence of 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 patent left common carotid- subclavian artery bypass with turbulent flow noted at the anastomosis with velocities of 295 cm/s  . CAROTID-SUBCLAVIAN BYPASS GRAFT  12/15/2011   Procedure: BYPASS GRAFT CAROTID-SUBCLAVIAN;  Surgeon: Serafina Mitchell, MD;  Location: [facility] OR;  Service: Vascular;  Laterality: Left;  Left Carotid subclavian bypass  . CORONARY ANGIOPLASTY    . CORONARY ARTERY BYPASS GRAFT    . DOPPLER ECHOCARDIOGRAPHY  05/27/2010, 09/17/2008   Mild Proximal septal thickening is noted. Left ventricular systolic functions is normal ejection fraction =>55%. the aortic valve appears to be mildly sclerotic   . fem-fem bypass graft  1999  . holter monitor  01/21/2008   The predominant rhythm was normal sinus rhythm. Minimum heartrate of 63 bpm at +01:00, maximum heartrate of 105 bpm at + 10:35; and the average heartrate of 75 bpm. Ventricular ectopic activity totaled 1270: Multifocal; 866-PVC's and 404-VEs             . JOINT REPLACEMENT     Left knee  . LEFT HEART CATHETERIZATION WITH CORONARY/GRAFT ANGIOGRAM N/A 12/21/2011   Procedure: LEFT HEART CATHETERIZATION WITH Beatrix Fetters;  Surgeon: Leonie Man, MD;  Location[facility] CATH LAB;  Service: Cardiovascular;  Laterality: N/A;  . NM  MYOCAR PERF EJECTION FRACTION  09/22/2009, 07/03/2007   the post stress myocardial perfusion images show a normal pattern of perfusion is all regions. The post-stress ejection fraction is 68 %. no significant wall motion abnormalities noted. This is a low risk scan.  Marland Kitchen PERIPHERAL VASCULAR INTERVENTION  08/22/2017   Procedure: PERIPHERAL VASCULAR INTERVENTION;  Surgeon: Serafina Mitchell, MD;  Location: [facility];  Service: Cardiovascular;;  Fem/Fem Graft  . REPLACEMENT TOTAL KNEE  05-2011  . UNILATERAL UPPER EXTREMEITY ANGIOGRAM Left 11/15/2011   Procedure: UNILATERAL UPPER EXTREMEITY ANGIOGRAM;  Surgeon: Lorretta Harp, MD;  Location: [facility] CATH LAB;  Service: Cardiovascular;  Laterality: Left;    Allergies  Allergen Reactions  . Donepezil Other (See Comments)    Altered mood, aggression, and caused anger  . Duloxetine Hcl     Increased confusion and memory concerns   . Hydrocodone-Acetaminophen Other (See Comments)    "Delirium, confusion, toxic dementia"  . Meloxicam     Pt suspects that this medicine causes fluid on her lungs.   . Memantine Other (See Comments)  Caused severe aggression  . 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 Vicodin [Hydrocodone-Acetaminophen] Other (See Comments)    Delirium, confusion, and toxic dementia    Outpatient Encounter Medications as of 07/01/2019  Medication Sig  . acetaminophen (TYLENOL) 325 MG tablet 1 by mouth in the morning and 1 by mouth in the pm.  . ALPRAZolam (XANAX) 0.5 MG tablet Take 1 tablet (0.5 mg total) by mouth daily as needed for anxiety.  Marland Kitchen amLODipine (NORVASC) 5 MG tablet TAKE 1 TABLET EVERY DAY  . aspirin EC 81 MG tablet Take 81 mg by mouth at bedtime.   . brimonidine (ALPHAGAN) 0.2 % ophthalmic solution Place 1 drop into both eyes 2 (two) times daily.  . cholecalciferol (VITAMIN D) 400 units TABS tablet Take 400 Units by mouth daily.  . divalproex (DEPAKOTE ER) 250 MG  24 hr tablet TAKE 1 TABLET(250 MG) BY MOUTH TWICE DAILY  . dorzolamide (TRUSOPT) 2 % ophthalmic solution Place 1 drop into both eyes 2 (two) times daily.  Marland Kitchen escitalopram (LEXAPRO) 20 MG tablet TAKE 1 TABLET(20 MG) BY MOUTH DAILY  . furosemide (LASIX) 40 MG tablet Take 1 tablet (40 mg total) by mouth daily as needed. Take 1 tablet if you have more than a 2 pound weight gain in 24 hours as needed.  . latanoprost (XALATAN) 0.005 % ophthalmic solution Place 1 drop into both eyes at bedtime.  Marland Kitchen levothyroxine (SYNTHROID) 200 MCG tablet TAKE 1 TABLET(200 MCG) BY MOUTH DAILY BEFORE BREAKFAST  . meclizine (ANTIVERT) 25 MG tablet Take 1 tablet (25 mg total) by mouth as needed for dizziness.  . metoprolol tartrate (LOPRESSOR) 25 MG tablet Take 12.5 mg by mouth 2 (two) times daily.  . mirabegron ER (MYRBETRIQ) 25 MG TB24 tablet Take 25 mg by mouth daily.   . pantoprazole (PROTONIX) 40 MG tablet TAKE 1 TABLET EVERY DAY  . promethazine (PHENERGAN) 12.5 MG tablet Take 12.5 mg by mouth every 6 (six) hours as needed for nausea or vomiting.  . rosuvastatin (CRESTOR) 20 MG tablet TAKE 1 TABLET(20 MG) BY MOUTH DAILY  . tiZANidine (ZANAFLEX) 2 MG tablet Take 1 tablet (2 mg total) by mouth every 6 (six) hours as needed for muscle spasms.  . traMADol (ULTRAM) 50 MG tablet Take one tablet by mouth every 4 hours as needed for pain  . valsartan (DIOVAN) 80 MG tablet TAKE 1 TABLET EVERY MORNING  . clopidogrel (PLAVIX) 75 MG tablet TAKE 1 TABLET (75 MG TOTAL) BY MOUTH DAILY.   No facility-administered encounter medications on file as of 07/01/2019.     Review of Systems  Constitutional: Negative for appetite change, chills, fatigue and fever.  Respiratory: Negative for cough, chest tightness, shortness of breath and wheezing.   Cardiovascular: Positive for leg swelling. Negative for chest pain and palpitations.  Gastrointestinal: Negative for abdominal distention, abdominal pain, constipation, diarrhea, nausea and  vomiting.  Musculoskeletal: Positive for arthralgias.  Skin: Negative for color change, pallor and rash.  Neurological: Negative for dizziness, weakness, light-headedness, numbness and headaches.  Psychiatric/Behavioral: Negative for agitation, behavioral problems and sleep disturbance. The patient is not nervous/anxious.     Immunization History  Administered Date(s) Administered  . Fluad Quad(high Dose 65+) 04/08/2019  . Influenza, High Dose Seasonal PF 06/22/2016, 07/13/2017, 05/31/2018  . Influenza,inj,Quad PF,6+ Mos 06/11/2013  . Pneumococcal Conjugate-13 11/25/2016  . Pneumococcal Polysaccharide-23 07/08/2015  . Zoster 06/22/2016   Pertinent  Health Maintenance Due  Topic Date Due  . COLONOSCOPY  12/22/2020  . INFLUENZA VACCINE  Completed  . DEXA SCAN  Completed  . PNA vac Low Risk Adult  Completed   Fall Risk  07/01/2019 04/08/2019 11/30/2018 11/20/2018 03/07/2018  Falls in the past year? 0 0 1 1 No  Number falls in past yr: 0 0 0 0 -  Injury with Fall? 0 0 0 0 -  Risk for fall due to : - - - - -  Follow up - - - - -   There were no vitals filed for this visit. There is no height or weight on file to calculate BMI. Physical Exam Unable to complete on telephone visit.   Labs reviewed: Recent Labs    01/11/19 1002  NA 140  K 4.9  CL 102  CO2 29  GLUCOSE 106*  BUN 24  CREATININE 0.99*  CALCIUM 9.2   Recent Labs    01/11/19 1002  AST 12  ALT 7  BILITOT 0.7  PROT 6.5   Recent Labs    01/11/19 1002  WBC 6.6  NEUTROABS 3,656  HGB 13.0  HCT 39.1  MCV 85.7  PLT 129*   Lab Results  Component Value Date   TSH 4.43 01/11/2019   Lab Results  Component Value Date   HGBA1C 5.6 01/11/2019   Lab Results  Component Value Date   CHOL 134 01/11/2019   HDL 35 (L) 01/11/2019   LDLCALC 71 01/11/2019   TRIG 211 (H) 01/11/2019   CHOLHDL 3.8 01/11/2019    Significant Diagnostic Results in last 30 days:  No results found.  Assessment/Plan   Other  chronic pain Right hip pain.Requires refill on her Tramadol.Has upcoming appointment for cortisol injection in 07/23/2019.  - tiZANidine (ZANAFLEX) 2 MG tablet; Take 1 tablet (2 mg total) by mouth every 6 (six) hours as needed for muscle spasms.  Dispense: 30 tablet; Refill: 5 - traMADol (ULTRAM) 50 MG tablet; Take one tablet by mouth every 4 hours as needed for pain  Dispense: 30 tablet; Refill: 0  Family/ staff Communication: Reviewed plan of care with patient.  Labs/tests ordered: None  Spent 11 minutes of non-face to face with patient   Sandrea Hughs, NP

## 2019-07-11 DIAGNOSIS — M1611 Unilateral primary osteoarthritis, right hip: Secondary | ICD-10-CM | POA: Diagnosis not present

## 2019-07-12 ENCOUNTER — Other Ambulatory Visit: Payer: Self-pay | Admitting: Nurse Practitioner

## 2019-08-16 ENCOUNTER — Other Ambulatory Visit: Payer: Self-pay

## 2019-08-16 ENCOUNTER — Encounter: Payer: Self-pay | Admitting: Nurse Practitioner

## 2019-08-16 ENCOUNTER — Ambulatory Visit (INDEPENDENT_AMBULATORY_CARE_PROVIDER_SITE_OTHER): Payer: Medicare HMO | Admitting: Nurse Practitioner

## 2019-08-16 VITALS — BP 132/76 | HR 74 | Temp 98.0°F | Ht 62.0 in | Wt 210.0 lb

## 2019-08-16 DIAGNOSIS — F39 Unspecified mood [affective] disorder: Secondary | ICD-10-CM | POA: Diagnosis not present

## 2019-08-16 DIAGNOSIS — S069X9D Unspecified intracranial injury with loss of consciousness of unspecified duration, subsequent encounter: Secondary | ICD-10-CM | POA: Diagnosis not present

## 2019-08-16 DIAGNOSIS — I509 Heart failure, unspecified: Secondary | ICD-10-CM | POA: Diagnosis not present

## 2019-08-16 DIAGNOSIS — M5441 Lumbago with sciatica, right side: Secondary | ICD-10-CM | POA: Diagnosis not present

## 2019-08-16 DIAGNOSIS — E785 Hyperlipidemia, unspecified: Secondary | ICD-10-CM

## 2019-08-16 DIAGNOSIS — I739 Peripheral vascular disease, unspecified: Secondary | ICD-10-CM

## 2019-08-16 DIAGNOSIS — I25708 Atherosclerosis of coronary artery bypass graft(s), unspecified, with other forms of angina pectoris: Secondary | ICD-10-CM | POA: Diagnosis not present

## 2019-08-16 DIAGNOSIS — I10 Essential (primary) hypertension: Secondary | ICD-10-CM | POA: Diagnosis not present

## 2019-08-16 DIAGNOSIS — I2510 Atherosclerotic heart disease of native coronary artery without angina pectoris: Secondary | ICD-10-CM

## 2019-08-16 DIAGNOSIS — E039 Hypothyroidism, unspecified: Secondary | ICD-10-CM

## 2019-08-16 MED ORDER — GABAPENTIN 100 MG PO CAPS: 100.0000 mg | ORAL_CAPSULE | Freq: Two times a day (BID) | ORAL | 3 refills | Status: DC | PRN

## 2019-08-16 NOTE — Patient Instructions (Addendum)
  Need follow up with cardiology and vascular Lorretta Harp, MD  Cardiologist [address] [phone]  Dr. Harold Barban IV, MD Phone: [phone]  To start gabapentin 100 mg by mouth twice daily for back and nerve pain To get xray of back at [facility] May need referral to neuro-specialist for back  Can increase tylenol to 325 mg 1-2 tablets every 6 hours- can take up to 6 tablets safely in 24 hours.

## 2019-08-16 NOTE — Progress Notes (Signed)
Careteam: Patient Care Team: Lauree Chandler, NP as PCP - General (Geriatric Medicine) Lorretta Harp, MD as PCP - Cardiology (Cardiology) Irene Shipper, MD as Consulting Physician (Gastroenterology) Frederik Pear, MD as Consulting Physician (Orthopedic Surgery) Fontaine, Belinda Block, MD as Consulting Physician (Gynecology) Rutherford Guys, MD as Consulting Physician (Ophthalmology) Bjorn Loser, MD as Consulting Physician (Urology) Serafina Mitchell, MD as Consulting Physician (Vascular Surgery) Cameron Sprang, MD as Consulting Physician (Neurology) Desma Maxim, MD as Referring Physician (Ophthalmology) Thayer Headings, PMHNP as Nurse Practitioner (Psychiatry) Cameron Sprang, MD as Consulting Physician (Neurology) Standley Brooking, LCSW as Social Worker  Advanced Directive information    Allergies  Allergen Reactions  . Donepezil Other (See Comments)    Altered mood, aggression, and caused anger  . Duloxetine Hcl     Increased confusion and memory concerns   . Hydrocodone-Acetaminophen Other (See Comments)    "Delirium, confusion, toxic dementia"  . Meloxicam     Pt suspects that this medicine causes fluid on her lungs.   . Memantine Other (See Comments)    Caused severe aggression  . 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 Vicodin [Hydrocodone-Acetaminophen] Other (See Comments)    Delirium, confusion, and toxic dementia    Chief Complaint  Patient presents with  . Follow-up    6 week follow-up. Here with daughter  . Immunizations    Discuss need for TD/TDaP   . Back Pain    Patient with right hip, tailbone, and back pain.   . Dizziness    Patient c/o dizziness      HPI: Patient is a 79 y.o. female for routine follow up in office.  Daughter states she could not get her mother in with neurology, "she did not want to see her"   depakote has been reduced from 750 mg to 250 mg BID. The tremor  did not improve with the reduction but anxiety was very much worse therefore they increased depakote back to BID.  Things are going on at home that is effecting her.  Having a hard time living with the money she has. Having anxiety over the appt today.  Following with psych at [facility] she prescribes her xanax.   Hypertension- controlled on norvasc   LE edema- overall controlled uses lasix rarely.  hypothyroid continues on synthroid 200 mcg  OAB- uses myrbetriq, went a week and a half without it and did not know any difference for 3 weeks.  May try to go every other day. Now feeling like she needs.   GERD- controlled on protonix.   Constant low back pain and hip pain- goes to orthopedic-  Got prednisone dose pack which stopped back pain but she was very loopy. Pain in low back goes across right side of buttock and down leg.    Review of Systems:  Review of Systems  Constitutional: Negative for chills, fever and weight loss.  Respiratory: Negative for cough, sputum production and shortness of breath.   Cardiovascular: Negative for chest pain, palpitations and leg swelling.  Gastrointestinal: Negative for abdominal pain, constipation, diarrhea and heartburn.  Genitourinary: Positive for frequency. Negative for dysuria and urgency.  Musculoskeletal: Positive for back pain. Negative for joint pain and myalgias.  Skin: Negative.   Neurological: Negative for dizziness and headaches.  Psychiatric/Behavioral: Positive for memory loss. Negative for depression. The patient is nervous/anxious. The patient does not have insomnia.  Past Medical History:  Diagnosis Date  . AMI 11/17/2008   Qualifier: Diagnosis of  By: Marland Mcalpine    . Anxiety   . Atherosclerotic peripheral vascular disease ([place])   . Bacteremia, escherichia coli 04/27/2015  . Brachial-basilar insufficiency syndrome   . CAD (coronary artery disease)    followed by dr [name].  . Celiac artery stenosis ([place])    . CHF (congestive heart failure) ([place]) 11/14/2017  . Chronic bilateral low back pain without sciatica   . Cognitive impairment   . Colon polyps   . Community acquired pneumonia   . Depression   . Diverticulosis   . Dysuria   . Encephalomalacia   . GAD (generalized anxiety disorder)   . GERD (gastroesophageal reflux disease)   . Glaucoma   . Hearing loss   . Hemorrhoids   . Hypertension   . Hypothyroidism   . Incontinence   . Mixed hyperlipidemia   . Myocardial infarction ([place])   . OSA on CPAP   . Peripheral arterial disease ([place])   . Pneumonitis 04/26/2015  . Pyelonephritis   . Pyelonephritis due to Escherichia coli 04/28/2015  . Sepsis ([place])   . Sinus drainage    took z-pack   finished yesterday  . Spondylosis of lumbar region without myelopathy or radiculopathy   . 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[place])   . Urticaria   . Vertigo, benign positional    Past Surgical History:  Procedure Laterality Date  . ABDOMINAL AORTOGRAM W/LOWER EXTREMITY N/A 08/22/2017   Procedure: ABDOMINAL AORTOGRAM W/LOWER EXTREMITY;  Surgeon: Serafina Mitchell, MD;  Location: [facility];  Service: Cardiovascular;  Laterality: N/A;  . BILATERAL UPPER EXTREMITY ANGIOGRAM N/A 09/11/2012   Procedure: BILATERAL UPPER EXTREMITY ANGIOGRAM;  Surgeon: Serafina Mitchell, MD;  Location: [facility] CATH LAB;  Service: Cardiovascular;  Laterality: N/A;  . carotid duplex doppler Bilateral 09/03/2012, 11/03/2011   Evidence of 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 patent left common carotid- subclavian artery bypass with turbulent flow noted at the anastomosis with velocities of 295 cm/s  . CAROTID-SUBCLAVIAN BYPASS GRAFT  12/15/2011   Procedure: BYPASS GRAFT CAROTID-SUBCLAVIAN;  Surgeon: Serafina Mitchell, MD;  Location: [facility] OR;  Service: Vascular;  Laterality: Left;  Left Carotid  subclavian bypass  . CORONARY ANGIOPLASTY    . CORONARY ARTERY BYPASS GRAFT    . DOPPLER ECHOCARDIOGRAPHY  05/27/2010, 09/17/2008   Mild Proximal septal thickening is noted. Left ventricular systolic functions is normal ejection fraction =>55%. the aortic valve appears to be mildly sclerotic   . fem-fem bypass graft  1999  . holter monitor  01/21/2008   The predominant rhythm was normal sinus rhythm. Minimum heartrate of 63 bpm at +01:00, maximum heartrate of 105 bpm at + 10:35; and the average heartrate of 75 bpm. Ventricular ectopic activity totaled 1270: Multifocal; 866-PVC's and 404-VEs             . JOINT REPLACEMENT     Left knee  . LEFT HEART CATHETERIZATION WITH CORONARY/GRAFT ANGIOGRAM N/A 12/21/2011   Procedure: LEFT HEART CATHETERIZATION WITH Beatrix Fetters;  Surgeon: Leonie Man, MD;  Location: [facility] CATH LAB;  Service: Cardiovascular;  Laterality: N/A;  . NM MYOCAR PERF EJECTION FRACTION  09/22/2009, 07/03/2007   the post stress myocardial perfusion images show a normal pattern of perfusion is all regions. The post-stress ejection fraction is 68 %. no significant wall  motion abnormalities noted. This is a low risk scan.  Marland Kitchen PERIPHERAL VASCULAR INTERVENTION  08/22/2017   Procedure: PERIPHERAL VASCULAR INTERVENTION;  Surgeon: Serafina Mitchell, MD;  Location: [facility];  Service: Cardiovascular;;  Fem/Fem Graft  . REPLACEMENT TOTAL KNEE  05-2011  . UNILATERAL UPPER EXTREMEITY ANGIOGRAM Left 11/15/2011   Procedure: UNILATERAL UPPER EXTREMEITY ANGIOGRAM;  Surgeon: Lorretta Harp, MD;  Location: [facility] CATH LAB;  Service: Cardiovascular;  Laterality: Left;   Social History:   reports that she quit smoking about 37 years ago. Her smoking use included cigarettes. She quit after 3.00 years of use. She has never used smokeless tobacco. She reports previous alcohol use. She reports that she does not use drugs.  Family History  Problem Relation Age of Onset  . Heart attack Mother    . Heart disease Mother        before age 45  . Diabetes Father   . Heart disease Father   . Hypertension Father   . Hyperlipidemia Father   . Heart attack Father 67  . Heart attack Brother 70  . Cerebral palsy Sister 59  . Congestive Heart Failure Sister 59  . Heart attack Sister 41  . Hypertension Sister 54  . Dementia Sister   . Anxiety disorder Sister   . Anxiety disorder Sister 73  . Heart Problems Sister   . Stroke Sister   . Heart Problems Sister 24  . Heart attack Sister 35  . Colon cancer Brother 70  . Prostate cancer Brother 70  . Hypertension Daughter   . Irritable bowel syndrome Daughter   . Depression Daughter   . Anxiety disorder Daughter     Medications: Patient's Medications  New Prescriptions   No medications on file  Previous Medications   ACETAMINOPHEN (TYLENOL) 325 MG TABLET    1 by mouth in the morning and 1 by mouth in the pm.   ALPRAZOLAM (XANAX) 0.5 MG TABLET    Take 1 tablet (0.5 mg total) by mouth daily as needed for anxiety.   AMLODIPINE (NORVASC) 5 MG TABLET    TAKE 1 TABLET EVERY DAY   ASPIRIN EC 81 MG TABLET    Take 81 mg by mouth at bedtime.    BRIMONIDINE (ALPHAGAN) 0.2 % OPHTHALMIC SOLUTION    Place 1 drop into both eyes 2 (two) times daily.   CHOLECALCIFEROL (VITAMIN D) 400 UNITS TABS TABLET    Take 400 Units by mouth daily.   CLOPIDOGREL (PLAVIX) 75 MG TABLET    TAKE 1 TABLET (75 MG TOTAL) BY MOUTH DAILY.   DIVALPROEX (DEPAKOTE ER) 250 MG 24 HR TABLET    TAKE 1 TABLET(250 MG) BY MOUTH TWICE DAILY   DORZOLAMIDE (TRUSOPT) 2 % OPHTHALMIC SOLUTION    Place 1 drop into both eyes 2 (two) times daily.   ESCITALOPRAM (LEXAPRO) 20 MG TABLET    TAKE 1 TABLET(20 MG) BY MOUTH DAILY   FUROSEMIDE (LASIX) 40 MG TABLET    Take 1 tablet (40 mg total) by mouth daily as needed. Take 1 tablet if you have more than a 2 pound weight gain in 24 hours as needed.   LATANOPROST (XALATAN) 0.005 % OPHTHALMIC SOLUTION    Place 1 drop into both eyes at bedtime.    LEVOTHYROXINE (SYNTHROID) 200 MCG TABLET    TAKE 1 TABLET(200 MCG) BY MOUTH DAILY BEFORE BREAKFAST   MECLIZINE (ANTIVERT) 25 MG TABLET    Take 1 tablet (25 mg total) by mouth as needed  for dizziness.   METOPROLOL TARTRATE (LOPRESSOR) 25 MG TABLET    Take 12.5 mg by mouth 2 (two) times daily.   MIRABEGRON ER (MYRBETRIQ) 25 MG TB24 TABLET    Take 25 mg by mouth daily.    PANTOPRAZOLE (PROTONIX) 40 MG TABLET    TAKE 1 TABLET EVERY DAY   PROMETHAZINE (PHENERGAN) 12.5 MG TABLET    Take 12.5 mg by mouth every 6 (six) hours as needed for nausea or vomiting.   ROSUVASTATIN (CRESTOR) 20 MG TABLET    TAKE 1 TABLET(20 MG) BY MOUTH DAILY   TIZANIDINE (ZANAFLEX) 2 MG TABLET    Take 1 tablet (2 mg total) by mouth every 6 (six) hours as needed for muscle spasms.   TRAMADOL (ULTRAM) 50 MG TABLET    Take one tablet by mouth every 4 hours as needed for pain   VALSARTAN (DIOVAN) 80 MG TABLET    TAKE 1 TABLET EVERY MORNING  Modified Medications   No medications on file  Discontinued Medications   No medications on file    Physical Exam:  Vitals:   08/16/19 1315  BP: 132/76  Pulse: 74  Temp: 98 F (36.7 C)  TempSrc: Temporal  SpO2: 97%  Weight: 210 lb (95.3 kg)  Height: 5\' 2"  (1.575 m)   Body mass index is 38.41 kg/m. Wt Readings from Last 3 Encounters:  08/16/19 210 lb (95.3 kg)  04/08/19 199 lb (90.3 kg)  03/27/19 211 lb (95.7 kg)    Physical Exam Constitutional:      General: She is not in acute distress.    Appearance: She is well-developed. She is not diaphoretic.  HENT:     Head: Normocephalic and atraumatic.     Mouth/Throat:     Pharynx: No oropharyngeal exudate.  Eyes:     Conjunctiva/sclera: Conjunctivae normal.     Pupils: Pupils are equal, round, and reactive to light.  Cardiovascular:     Rate and Rhythm: Normal rate and regular rhythm.     Heart sounds: Normal heart sounds.  Pulmonary:     Effort: Pulmonary effort is normal.     Breath sounds: Normal breath sounds.    Abdominal:     General: Bowel sounds are normal.     Palpations: Abdomen is soft.  Musculoskeletal:        General: No tenderness.     Cervical back: Normal range of motion and neck supple.  Skin:    General: Skin is warm and dry.  Neurological:     General: No focal deficit present.     Mental Status: She is alert.     Gait: Gait normal.  Psychiatric:        Mood and Affect: Mood normal.        Behavior: Behavior normal.        Cognition and Memory: Cognition is impaired. Memory is impaired.    Labs reviewed: Basic Metabolic Panel: Recent Labs    01/11/19 1002  NA 140  K 4.9  CL 102  CO2 29  GLUCOSE 106*  BUN 24  CREATININE 0.99*  CALCIUM 9.2  TSH 4.43   Liver Function Tests: Recent Labs    01/11/19 1002  AST 12  ALT 7  BILITOT 0.7  PROT 6.5   No results for input(s): LIPASE, AMYLASE in the last 8760 hours. No results for input(s): AMMONIA in the last 8760 hours. CBC: Recent Labs    01/11/19 1002  WBC 6.6  NEUTROABS 3,656  HGB 13.0  HCT 39.1  MCV 85.7  PLT 129*   Lipid Panel: Recent Labs    01/11/19 1002  CHOL 134  HDL 35*  LDLCALC 71  TRIG 211*  CHOLHDL 3.8   TSH: Recent Labs    01/11/19 1002  TSH 4.43   A1C: Lab Results  Component Value Date   HGBA1C 5.6 01/11/2019     Assessment/Plan 1. Acute right-sided low back pain with right-sided sciatica -ongoing over the last 3 months, has been seen for chronic hip pain which has improved after injection -can use tylenol 325 mg 1-2 tablets every 6 hours as needed for pain and will add gabapentin. - gabapentin (NEURONTIN) 100 MG capsule; Take 1 capsule (100 mg total) by mouth 2 (two) times daily as needed.  Dispense: 60 capsule; Refill: 3 - DG Lumbar Spine Complete; Future  2. Acquired hypothyroidism Continues on synthroid 200 mcg daily  - TSH  3. Mood disorder ([place]) -ongoing anxiety related to financial issues at this time. Daughter is working with her. She continues on Depakote  which has been beneficial. Also taking lexaprol, and Xanax PRN. Rarely uses xanax. Continues to follow up with psych.   4. Essential hypertension -controlled on lopressor  - CBC with Differential/Platelet  5. Traumatic brain injury with loss of consciousness, subsequent encounter -with memory loss, being followed by neurology and psychiatrist.   6. Hyperlipidemia LDL goal <70 Continues on crestor 20 mg daily. Encouraged dietary modifications and increase in physical activity - Lipid Panel - COMPLETE METABOLIC PANEL WITH GFR  7. Coronary artery disease involving native coronary artery of native heart without angina pectoris Stable, without chest pain. Encouraged to call for follow up with cardiologist as it has been over a year. Continues on crestor, plavix and asa  8. Chronic congestive heart failure, unspecified heart failure type ([place]) Euvolemic, has not needed lasix. Continues on losartan and lopressor  9. Peripheral arterial disease ([facility]) Stable at this time. Overdue for follow up with vascular. Number given to set up follow up.  Continues on ASA and plavix.   10. Morbid (severe) obesity due to excess calories ([place]) Noted, encouraged weight loss through diet and exercise  11. Atherosclerosis of coronary artery bypass graft(s), unspecified, with other forms of angina pectoris ([place]) Stable. Continues on plavix and asa, encouraged dietary modifications.    Next appt: 4 months.  [name]. [place], [facility] [phone]

## 2019-08-17 LAB — CBC WITH DIFFERENTIAL/PLATELET
Absolute Monocytes: 663 cells/uL (ref 200–950)
Basophils Absolute: 43 cells/uL (ref 0–200)
Basophils Relative: 0.5 %
Eosinophils Absolute: 34 cells/uL (ref 15–500)
Eosinophils Relative: 0.4 %
HCT: 40 % (ref 35.0–45.0)
Hemoglobin: 13.5 g/dL (ref 11.7–15.5)
Lymphs Abs: 3349 cells/uL (ref 850–3900)
MCH: 29 pg (ref 27.0–33.0)
MCHC: 33.8 g/dL (ref 32.0–36.0)
MCV: 85.8 fL (ref 80.0–100.0)
MPV: 9.7 fL (ref 7.5–12.5)
Monocytes Relative: 7.8 %
Neutro Abs: 4412 cells/uL (ref 1500–7800)
Neutrophils Relative %: 51.9 %
Platelets: 154 10*3/uL (ref 140–400)
RBC: 4.66 10*6/uL (ref 3.80–5.10)
RDW: 13.7 % (ref 11.0–15.0)
Total Lymphocyte: 39.4 %
WBC: 8.5 10*3/uL (ref 3.8–10.8)

## 2019-08-17 LAB — COMPLETE METABOLIC PANEL WITH GFR
AG Ratio: 1.8 (calc) (ref 1.0–2.5)
ALT: 10 U/L (ref 6–29)
AST: 12 U/L (ref 10–35)
Albumin: 4 g/dL (ref 3.6–5.1)
Alkaline phosphatase (APISO): 54 U/L (ref 37–153)
BUN/Creatinine Ratio: 13 (calc) (ref 6–22)
BUN: 13 mg/dL (ref 7–25)
CO2: 25 mmol/L (ref 20–32)
Calcium: 9.4 mg/dL (ref 8.6–10.4)
Chloride: 102 mmol/L (ref 98–110)
Creat: 1.04 mg/dL — ABNORMAL HIGH (ref 0.60–0.93)
GFR, Est African American: 60 mL/min/{1.73_m2} (ref >=60)
GFR, Est Non African American: 52 mL/min/{1.73_m2} — ABNORMAL LOW (ref >=60)
Globulin: 2.2 g/dL (calc) (ref 1.9–3.7)
Glucose, Bld: 100 mg/dL — ABNORMAL HIGH (ref 65–99)
Potassium: 4.5 mmol/L (ref 3.5–5.3)
Sodium: 138 mmol/L (ref 135–146)
Total Bilirubin: 0.9 mg/dL (ref 0.2–1.2)
Total Protein: 6.2 g/dL (ref 6.1–8.1)

## 2019-08-17 LAB — LIPID PANEL
Cholesterol: 146 mg/dL (ref <200)
HDL: 33 mg/dL — ABNORMAL LOW (ref >=50)
LDL Cholesterol (Calc): 83 mg/dL (calc)
Non-HDL Cholesterol (Calc): 113 mg/dL (calc) (ref <130)
Total CHOL/HDL Ratio: 4.4 (calc) (ref <5.0)
Triglycerides: 206 mg/dL — ABNORMAL HIGH (ref <150)

## 2019-08-17 LAB — TSH: TSH: 0.13 mIU/L — ABNORMAL LOW (ref 0.40–4.50)

## 2019-08-22 ENCOUNTER — Telehealth: Payer: Self-pay

## 2019-08-22 DIAGNOSIS — E785 Hyperlipidemia, unspecified: Secondary | ICD-10-CM

## 2019-08-22 DIAGNOSIS — E039 Hypothyroidism, unspecified: Secondary | ICD-10-CM

## 2019-08-22 MED ORDER — LEVOTHYROXINE SODIUM 175 MCG PO TABS: 175.0000 ug | ORAL_TABLET | Freq: Every day | ORAL | 0 refills | Status: DC

## 2019-08-22 NOTE — Addendum Note (Signed)
Addended by: Lauree Chandler on: 08/22/2019 04:52 PM   Modules accepted: Orders

## 2019-08-22 NOTE — Telephone Encounter (Signed)
When I spoke to patient daughter earlier today she stated that these are the changes she wanted because she stated that you told her these changes would take place. Patient daughter is wanting medications sent into pharmacy.

## 2019-08-22 NOTE — Telephone Encounter (Signed)
Patient pharmacy sent over fax to refill medication "Rosuvastatin 20mg ". Patient last appointment was 08/16/2019. Patient has upcoming appointment on 12/13/19. I spoke with patient daughter and she said that Crestor dosage is suppose to be increased. Daughter also states that Synthroid 200mg  is suppose to be 175mg  daily and that new prescription needs to be sent into pharmacy for that as well. Patient daughter confirmed that [facility] is fine to send both medications. Please Advise.

## 2019-08-22 NOTE — Telephone Encounter (Signed)
Per telephone note by Randell Patient she was not sure if she wanted to increase the dose of her cholesterol medication. If she does in fact want to increase dose of crestor as well as make dietary modification I will make the change and send it over. Please clarify this.  Thank you.

## 2019-08-23 MED ORDER — ROSUVASTATIN CALCIUM 40 MG PO TABS: 40.0000 mg | ORAL_TABLET | Freq: Every day | ORAL | 3 refills | Status: DC

## 2019-08-23 NOTE — Telephone Encounter (Signed)
Per Sherrie Mustache, NP request I called daughter  1.) To inform her medications (crestor and synthroid) sent to pharmacy.   2.) Scheduled a 8 week appointment to recheck TSH as listed on labs in original instructions on 08/19/2019. Janett Billow also verbally informed me she will recheck cholesterol at this time as well. Janett Billow has placed orders for requested labs  3.) Confirm [name] fully understood results and didn't have any additional questions  I was unable to leave a message, mailbox full. I will try again later

## 2019-08-26 NOTE — Telephone Encounter (Signed)
Called daughter, [name]. Mailbox is full, unable to leave a message. I will try again later.

## 2019-08-27 ENCOUNTER — Telehealth: Payer: Self-pay

## 2019-08-27 DIAGNOSIS — M5441 Lumbago with sciatica, right side: Secondary | ICD-10-CM

## 2019-08-27 NOTE — Telephone Encounter (Addendum)
Called [name] again. Mailbox full, unable to leave a message  I called [facility] to confirm patient picked up rx's for Levothyroxine and Crestor. Spoke with Deanna [name], patient has not picked up rx's yet.  I called an alternate number for [name] and was able to get through. Deanna [name] states she plans to pick up rx's today. Deanna [name] states they have a lot of legal issues going on and she was ignoring calls awaiting a call to address legal concerns.   Deanna [name] states Geophysicist/field seismologist) appeared confused when calling her about labs and that did create some confusion on her end as well.  All confusion cleared. Appointment scheduled for March to recheck TSH and Lipid.  Appointment scheduled for the PM for Deanna [name] states it is hard to get her mother up and out in the am.

## 2019-08-27 NOTE — Telephone Encounter (Signed)
We can make a referral to neuro specialist for further evaluation- she may benefit from an injection

## 2019-08-27 NOTE — Telephone Encounter (Signed)
Spoke with [name], patients daughter. Patient c/o of Sciatica nerve pain. [name] is experimenting with gabapentin for pain relief. [name] is giving her mother 200 mg Gabapentin in the am and 200 mg in the pm and patient still presents to be in pain.  About 1 month ago patient was given prednisone from [facility], Dr.Wong and it provided some relief. Patient questions if she should have another round of prednisone.  Per [name] patient with decreased mobility and refuses to be mobile at times due to pain.  Last appointment with Lauree Chandler, NP 08/16/2019 (recent)   Please advise

## 2019-08-27 NOTE — Telephone Encounter (Addendum)
I called Deanna [name] and she is in agreement with referral.  Message forwarded to Lauree Chandler, NP to place referral order. Deanna [name] would like for neuro to call the [phone] number, for she is dealing with some legal harrassment issues that results in her not answering unknown numbers. I have made this number bold in patients demographics as the first point of contact.

## 2019-08-27 NOTE — Telephone Encounter (Signed)
Copy of labs mailed  

## 2019-08-29 ENCOUNTER — Ambulatory Visit (INDEPENDENT_AMBULATORY_CARE_PROVIDER_SITE_OTHER): Payer: Medicare HMO | Admitting: Psychiatry

## 2019-08-29 ENCOUNTER — Encounter: Payer: Self-pay | Admitting: Psychiatry

## 2019-08-29 DIAGNOSIS — F411 Generalized anxiety disorder: Secondary | ICD-10-CM

## 2019-08-29 DIAGNOSIS — F39 Unspecified mood [affective] disorder: Secondary | ICD-10-CM

## 2019-08-29 MED ORDER — DIVALPROEX SODIUM ER 250 MG PO TB24: ORAL_TABLET | ORAL | 5 refills | Status: DC

## 2019-08-29 MED ORDER — ESCITALOPRAM OXALATE 20 MG PO TABS: ORAL_TABLET | ORAL | 5 refills | Status: DC

## 2019-08-29 MED ORDER — ALPRAZOLAM 0.5 MG PO TABS: 0.5000 mg | ORAL_TABLET | Freq: Every day | ORAL | 1 refills | Status: DC | PRN

## 2019-08-29 NOTE — Progress Notes (Signed)
Deanna [name] [id number] 04/24/1941 79 y.o.  Virtual Visit via Telephone Note  I connected with pt on 08/29/19 at  1:30 PM EST by telephone and verified that I am speaking with the correct person using/ two/ identifiers.  I discussed the limitations, risks, security and privacy concerns of performing an evaluation and management service by telephone and the availability of in person appointments. I also discussed with the patient that there may be a patient responsible charge related to this service. The patient expressed understanding and agreed to proceed.   I discussed the assessment and treatment plan with the patient. The patient was provided an opportunity to ask questions and all were answered. The patient agreed with the plan and demonstrated an understanding of the instructions.   The patient was advised to call back or seek an in-person evaluation if the symptoms worsen or if the condition fails to improve as anticipated.  I provided 30 minutes of non-face-to-face time during this encounter.  The patient was located at home.  The provider was located at [place].   Deanna [name], PMHNP   Subjective:   Patient ID:  Deanna [name] is a 79 y.o. (DOB 11/05/1940) female.  Chief Complaint:  Chief Complaint  Patient presents with  . Follow-up    h/o depression and anxiety    HPI Deanna [name] presents for follow-up of anxiety and mood. Her daughter Deanna [name] also participates in visit. Pt reports that her mobility has been limited due to back pain and sciatica. She reports that her mood has been ok, aside from frustration with pain. She reports that she has "a little depression but it doesn't last that long." She reports that she is able to lift her mood by talking with daughter and friends. She reports that she has anxiety when she has to go somewhere, like a medical apt. They report that Xanax prn is effective in these situations, however pt does not always want to  take it because she tries to deal with it without medication. Daughter reports that pt's Deanna [name] has been decreased and may stop in several months and this has caused pt some financial anxiety and sadness in response to loss of income. She reports occ sleep disturbance due to physical pain and recent stressors. Has been going to bed earlier some nights so she can get relief. Appetite has been good and estimates gaining a few lbs. Energy and motivation have been lower with severe pain. Denies SI.   Daughter reports that pt seems to be doing as well as could be expected considering financial stress, emotional stress, and physical pain.   Daughter reports that they had some Depakote Er 500 mg and she has been giving her this instead of Depakote ER 250 mg po BID  Past Psychiatric Medication Trials: Sertraline-initially helpful and then no longer effective Cymbalta-increased blood pressure Prozac-possible adverse reaction Lexapro Depakote Ativan-effective but caused excessive drowsiness Xanax-effective Aricept-anger Namenda-anger Gabapentin- limited improvement unless she takes higher doses that cause somnolence.  Review of Systems:  Review of Systems  Musculoskeletal: Positive for back pain. Negative for gait problem.       Sciatic pain. Has apt to see neurosurgeon  Neurological: Positive for tremors.  Psychiatric/Behavioral:       Please refer to HPI  Daughter reports that there has not been any recent worsening in tremor.   Medications: I have reviewed the patient's current medications.  Current Outpatient Medications  Medication Sig Dispense Refill  . acetaminophen (TYLENOL) 325  MG tablet 1 by mouth in the morning and 1 by mouth in the pm.    . ALPRAZolam (XANAX) 0.5 MG tablet Take 1 tablet (0.5 mg total) by mouth daily as needed for anxiety. 30 tablet 1  . amLODipine (NORVASC) 5 MG tablet TAKE 1 TABLET EVERY DAY 90 tablet 1  . aspirin EC 81 MG tablet Take 81 mg by mouth at  bedtime.     . brimonidine (ALPHAGAN) 0.2 % ophthalmic solution Place 1 drop into both eyes 2 (two) times daily.    . cholecalciferol (VITAMIN D) 400 units TABS tablet Take 400 Units by mouth daily.    . clopidogrel (PLAVIX) 75 MG tablet TAKE 1 TABLET (75 MG TOTAL) BY MOUTH DAILY. 90 tablet 2  . divalproex (DEPAKOTE ER) 250 MG 24 hr tablet TAKE 1 TABLET(250 MG) BY MOUTH TWICE DAILY 60 tablet 5  . dorzolamide (TRUSOPT) 2 % ophthalmic solution Place 1 drop into both eyes 2 (two) times daily.    Marland Kitchen escitalopram (LEXAPRO) 20 MG tablet TAKE 1 TABLET(20 MG) BY MOUTH DAILY 30 tablet 5  . furosemide (LASIX) 40 MG tablet Take 1 tablet (40 mg total) by mouth daily as needed. Take 1 tablet if you have more than a 2 pound weight gain in 24 hours as needed. 30 tablet 11  . gabapentin (NEURONTIN) 100 MG capsule Take 1 capsule (100 mg total) by mouth 2 (two) times daily as needed. 60 capsule 3  . latanoprost (XALATAN) 0.005 % ophthalmic solution Place 1 drop into both eyes at bedtime.    Marland Kitchen levothyroxine (SYNTHROID) 175 MCG tablet Take 1 tablet (175 mcg total) by mouth daily before breakfast. 90 tablet 0  . meclizine (ANTIVERT) 25 MG tablet Take 1 tablet (25 mg total) by mouth as needed for dizziness. 30 tablet 0  . metoprolol tartrate (LOPRESSOR) 25 MG tablet Take 12.5 mg by mouth 2 (two) times daily.    . mirabegron ER (MYRBETRIQ) 25 MG TB24 tablet Take 25 mg by mouth daily.     . pantoprazole (PROTONIX) 40 MG tablet TAKE 1 TABLET EVERY DAY 90 tablet 1  . promethazine (PHENERGAN) 12.5 MG tablet Take 12.5 mg by mouth every 6 (six) hours as needed for nausea or vomiting.    . rosuvastatin (CRESTOR) 40 MG tablet Take 1 tablet (40 mg total) by mouth daily. 90 tablet 3  . tiZANidine (ZANAFLEX) 2 MG tablet Take 1 tablet (2 mg total) by mouth every 6 (six) hours as needed for muscle spasms. 30 tablet 5  . valsartan (DIOVAN) 80 MG tablet TAKE 1 TABLET EVERY MORNING 90 tablet 1   No current facility-administered  medications for this visit.    Medication Side Effects: None  Allergies:  Allergies  Allergen Reactions  . Donepezil Other (See Comments)    Altered mood, aggression, and caused anger  . Duloxetine Hcl     Increased confusion and memory concerns   . Hydrocodone-Acetaminophen Other (See Comments)    "Delirium, confusion, toxic dementia"  . Meloxicam     Pt suspects that this medicine causes fluid on her lungs.   . Memantine Other (See Comments)    Caused severe aggression  . 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 Vicodin [Hydrocodone-Acetaminophen] Other (See Comments)    Delirium, confusion, and toxic dementia    Past Medical History:  Diagnosis Date  . AMI 11/17/2008   Qualifier: Diagnosis of  By: Raymondo Band,  Pamala Hurry    . Anxiety   . Atherosclerotic peripheral vascular disease ([place])   . Bacteremia, escherichia coli 04/27/2015  . Brachial-basilar insufficiency syndrome   . CAD (coronary artery disease)    followed by dr [name].  . Celiac artery stenosis ([place])   . CHF (congestive heart failure) ([place]) 11/14/2017  . Chronic bilateral low back pain without sciatica   . Cognitive impairment   . Colon polyps   . Community acquired pneumonia   . Depression   . Diverticulosis   . Dysuria   . Encephalomalacia   . GAD (generalized anxiety disorder)   . GERD (gastroesophageal reflux disease)   . Glaucoma   . Hearing loss   . Hemorrhoids   . Hypertension   . Hypothyroidism   . Incontinence   . Mixed hyperlipidemia   . Myocardial infarction ([place])   . OSA on CPAP   . Peripheral arterial disease ([place])   . Pneumonitis 04/26/2015  . Pyelonephritis   . Pyelonephritis due to Escherichia coli 04/28/2015  . Sepsis ([place])   . Sinus drainage    took z-pack   finished yesterday  . Spondylosis of lumbar region without myelopathy or radiculopathy   . 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[place])   . Urticaria   . Vertigo, benign  positional     Family History  Problem Relation Age of Onset  . Heart attack Mother   . Heart disease Mother        before age 15  . Diabetes Father   . Heart disease Father   . Hypertension Father   . Hyperlipidemia Father   . Heart attack Father 29  . Heart attack Brother 26  . Cerebral palsy Sister 70  . Congestive Heart Failure Sister 41  . Heart attack Sister 28  . Hypertension Sister 86  . Dementia Sister   . Anxiety disorder Sister   . Anxiety disorder Sister 74  . Heart Problems Sister   . Stroke Sister   . Heart Problems Sister 49  . Heart attack Sister 63  . Colon cancer Brother 27  . Prostate cancer Brother 34  . Hypertension Daughter   . Irritable bowel syndrome Daughter   . Depression Daughter   . Anxiety disorder Daughter     Social History   Socioeconomic History  . Marital status: Divorced    Spouse name: Not on file  . Number of children: Not on file  . Years of education: Not on file  . Highest education level: Not on file  Occupational History  . Not on file  Tobacco Use  . Smoking status: Former Smoker    Years: 3.00    Types: Cigarettes    Quit date: 11/27/1981    Years since quitting: 37.7  . Smokeless tobacco: Never Used  Substance and Sexual Activity  . Alcohol use: Not Currently    Alcohol/week: 0.0 standard drinks  . Drug use: No  . Sexual activity: Not Currently    Comment: 1st intercourse 60 yo-1 partner  Other Topics Concern  . Not on file  Social History Narrative   Social History      Diet? Healthy but too many sweets      Do you drink/eat things with caffeine? Yes occasionally      Marital status?                    D  What year were you married?      Do you live in a house, apartment, assisted living, condo, trailer, etc.? condo      Is it one or more stories? 1      How many persons live in your home? 2      Do you have any pets in your home? (please list) 1 cat      Highest level of education  completed? 1 year college      Current or past profession: housewife      Do you exercise?           no                           Type & how often?      Advanced Directives      Do you have a living will? no      Do you have a DNR form?             no                     If not, do you want to discuss one? yes      Do you have signed POA/HPOA for forms? no      Functional Status      Do you have difficulty bathing or dressing yourself? No- gets tired      Do you have difficulty preparing food or eating? No- eats frozen entrees or poor nutrition meals      Do you have difficulty managing your medications? yes      Do you have difficulty managing your finances? yes      Do you have difficulty affording your medications? No- funds running low   Social Determinants of Health   Financial Resource Strain:   . Difficulty of Paying Living Expenses: Not on file  Food Insecurity:   . Worried About Charity fundraiser in the Last Year: Not on file  . Ran Out of Food in the Last Year: Not on file  Transportation Needs:   . Lack of Transportation (Medical): Not on file  . Lack of Transportation (Non-Medical): Not on file  Physical Activity:   . Days of Exercise per Week: Not on file  . Minutes of Exercise per Session: Not on file  Stress:   . Feeling of Stress : Not on file  Social Connections:   . Frequency of Communication with Friends and Family: Not on file  . Frequency of Social Gatherings with Friends and Family: Not on file  . Attends Religious Services: Not on file  . Active Member of Clubs or Organizations: Not on file  . Attends Archivist Meetings: Not on file  . Marital Status: Not on file  Intimate Partner Violence:   . Fear of Current or Ex-Partner: Not on file  . Emotionally Abused: Not on file  . Physically Abused: Not on file  . Sexually Abused: Not on file    Past Medical History, Surgical history, Social history, and Family history were reviewed  and updated as appropriate.   Please see review of systems for further details on the patient's review from today.   Objective:   Physical Exam:  There were no vitals taken for this visit.  Physical Exam Neurological:     Mental Status: She is alert and oriented to person, place, and time.     Cranial Nerves:  No dysarthria.  Psychiatric:        Attention and Perception: Attention and perception normal.        Mood and Affect: Mood normal.        Speech: Speech normal.        Behavior: Behavior is cooperative.        Thought Content: Thought content normal. Thought content is not paranoid or delusional. Thought content does not include homicidal or suicidal ideation. Thought content does not include homicidal or suicidal plan.        Cognition and Memory: Cognition is impaired. She exhibits impaired recent memory.        Judgment: Judgment normal.     Comments: Insight fair     Lab Review:     Component Value Date/Time   NA 138 08/16/2019 1354   K 4.5 08/16/2019 1354   CL 102 08/16/2019 1354   CO2 25 08/16/2019 1354   GLUCOSE 100 (H) 08/16/2019 1354   BUN 13 08/16/2019 1354   CREATININE 1.04 (H) 08/16/2019 1354   CALCIUM 9.4 08/16/2019 1354   PROT 6.2 08/16/2019 1354   ALBUMIN 4.6 12/18/2015 1000   AST 12 08/16/2019 1354   ALT 10 08/16/2019 1354   ALKPHOS 81 12/18/2015 1000   BILITOT 0.9 08/16/2019 1354   GFRNONAA 52 (L) 08/16/2019 1354   GFRAA 60 08/16/2019 1354       Component Value Date/Time   WBC 8.5 08/16/2019 1354   RBC 4.66 08/16/2019 1354   HGB 13.5 08/16/2019 1354   HCT 40.0 08/16/2019 1354   PLT 154 08/16/2019 1354   MCV 85.8 08/16/2019 1354   MCH 29.0 08/16/2019 1354   MCHC 33.8 08/16/2019 1354   RDW 13.7 08/16/2019 1354   LYMPHSABS 3,349 08/16/2019 1354   MONOABS 0.5 04/27/2015 0310   EOSABS 34 08/16/2019 1354   BASOSABS 43 08/16/2019 1354    No results found for: POCLITH, LITHIUM   No results found for: PHENYTOIN, PHENOBARB, VALPROATE, CBMZ    .res Assessment: Plan:   30 minutes spent talking with pt and caregiver re: current stressors and recent acute pain. Will continue current plan of care since target signs and symptoms (anxiety and depression) are well controlled, considering current stressors and elevated pain, without any tolerability issues. Discussed using Alprazolam prn prior to events that typically cause her significant anxiety, such as going to appointments.  Pt to f/u in 6 months or sooner if clinically indicated. Patient advised to contact office with any questions, adverse effects, or acute worsening in signs and symptoms.  Fianna was seen today for follow-up.  Diagnoses and all orders for this visit:  Generalized anxiety disorder -     ALPRAZolam (XANAX) 0.5 MG tablet; Take 1 tablet (0.5 mg total) by mouth daily as needed for anxiety. -     escitalopram (LEXAPRO) 20 MG tablet; TAKE 1 TABLET(20 MG) BY MOUTH DAILY  Mood disorder (H[facility]) -     escitalopram (LEXAPRO) 20 MG tablet; TAKE 1 TABLET(20 MG) BY MOUTH DAILY -     divalproex (DEPAKOTE ER) 250 MG 24 hr tablet; TAKE 1 TABLET(250 MG) BY MOUTH TWICE DAILY    Please see After Visit Summary for patient specific instructions.  Future Appointments  Date Time Provider [place]  10/23/2019  2:00 PM [facility] None  12/13/2019  2:15 PM Lauree Chandler, NP [facility] None  02/26/2020  1:30 PM Deanna [name], PMHNP [facility] None    No orders of the defined types were  placed in this encounter.     -------------------------------

## 2019-09-03 DIAGNOSIS — M5416 Radiculopathy, lumbar region: Secondary | ICD-10-CM | POA: Diagnosis not present

## 2019-09-04 ENCOUNTER — Other Ambulatory Visit: Payer: Self-pay | Admitting: Psychiatry

## 2019-09-04 DIAGNOSIS — F39 Unspecified mood [affective] disorder: Secondary | ICD-10-CM

## 2019-09-04 DIAGNOSIS — F411 Generalized anxiety disorder: Secondary | ICD-10-CM

## 2019-09-05 NOTE — Telephone Encounter (Signed)
Submitted 1/21 with 5 refills

## 2019-09-09 ENCOUNTER — Other Ambulatory Visit: Payer: Self-pay | Admitting: Student

## 2019-09-09 DIAGNOSIS — M5416 Radiculopathy, lumbar region: Secondary | ICD-10-CM

## 2019-09-14 ENCOUNTER — Other Ambulatory Visit: Payer: Self-pay

## 2019-09-14 ENCOUNTER — Ambulatory Visit
Admission: RE | Admit: 2019-09-14 | Discharge: 2019-09-14 | Disposition: A | Discharge status: Home / Self Care | Payer: Medicare HMO | Source: Ambulatory Visit | Attending: Student | Admitting: Student

## 2019-09-14 DIAGNOSIS — M48061 Spinal stenosis, lumbar region without neurogenic claudication: Secondary | ICD-10-CM | POA: Diagnosis not present

## 2019-09-14 DIAGNOSIS — M5416 Radiculopathy, lumbar region: Secondary | ICD-10-CM

## 2019-09-17 DIAGNOSIS — M5416 Radiculopathy, lumbar region: Secondary | ICD-10-CM | POA: Diagnosis not present

## 2019-09-22 ENCOUNTER — Ambulatory Visit: Payer: Medicare HMO | Attending: Internal Medicine

## 2019-09-22 DIAGNOSIS — Z23 Encounter for immunization: Secondary | ICD-10-CM

## 2019-09-22 NOTE — Progress Notes (Signed)
   Covid-19 Vaccination Clinic  Name:  Deanna [name]    MRN: [id number] DOB: [birth year]  09/22/2019  [name] was observed post Covid-19 immunization for 15 minutes without incidence. She was provided with Vaccine Information Sheet and instruction to access the V-Safe system.   [name] was instructed to call 911 with any severe reactions post vaccine: Marland Kitchen Difficulty breathing  . Swelling of your face and throat  . A fast heartbeat  . A bad rash all over your body  . Dizziness and weakness    Immunizations Administered    Name Date Dose VIS Date Route   Pfizer COVID-19 Vaccine 09/22/2019  2:35 PM 0.3 mL 07/19/2019 Intramuscular   Manufacturer: [place]   Lot: [id number]   [place]: [address]-9

## 2019-10-09 DIAGNOSIS — M5416 Radiculopathy, lumbar region: Secondary | ICD-10-CM | POA: Diagnosis not present

## 2019-10-15 ENCOUNTER — Ambulatory Visit: Payer: Medicare HMO | Attending: Internal Medicine

## 2019-10-15 DIAGNOSIS — Z23 Encounter for immunization: Secondary | ICD-10-CM | POA: Insufficient documentation

## 2019-10-15 NOTE — Progress Notes (Signed)
   Covid-19 Vaccination Clinic  Name:  Deanna [name]    MRN: [id number] DOB: 05/25/1941  10/15/2019  [name] was observed post Covid-19 immunization for 15 minutes without incident. She was provided with Vaccine Information Sheet and instruction to access the V-Safe system.   [name] was instructed to call 911 with any severe reactions post vaccine: Marland Kitchen Difficulty breathing  . Swelling of face and throat  . A fast heartbeat  . A bad rash all over body  . Dizziness and weakness   Immunizations Administered    Name Date Dose VIS Date Route   Pfizer COVID-19 Vaccine 10/15/2019  2:01 PM 0.3 mL 07/19/2019 Intramuscular   Manufacturer: [place]   Lot: [id number]   [place]: [id number]

## 2019-10-16 ENCOUNTER — Ambulatory Visit: Payer: Medicare HMO

## 2019-10-23 ENCOUNTER — Other Ambulatory Visit: Payer: Medicare HMO

## 2019-10-23 ENCOUNTER — Other Ambulatory Visit: Payer: Self-pay

## 2019-11-07 ENCOUNTER — Ambulatory Visit: Payer: Medicare HMO | Attending: Internal Medicine

## 2019-11-07 DIAGNOSIS — Z23 Encounter for immunization: Secondary | ICD-10-CM

## 2019-11-12 ENCOUNTER — Other Ambulatory Visit: Payer: Self-pay | Admitting: Nurse Practitioner

## 2019-11-12 DIAGNOSIS — I10 Essential (primary) hypertension: Secondary | ICD-10-CM

## 2019-11-24 ENCOUNTER — Other Ambulatory Visit: Payer: Self-pay | Admitting: Cardiovascular Disease

## 2019-11-24 ENCOUNTER — Other Ambulatory Visit: Payer: Self-pay | Admitting: Nurse Practitioner

## 2019-11-24 DIAGNOSIS — K219 Gastro-esophageal reflux disease without esophagitis: Secondary | ICD-10-CM

## 2019-12-02 ENCOUNTER — Ambulatory Visit: Payer: Medicare HMO

## 2019-12-04 NOTE — Telephone Encounter (Signed)
error 

## 2019-12-09 ENCOUNTER — Other Ambulatory Visit: Payer: Self-pay

## 2019-12-11 ENCOUNTER — Other Ambulatory Visit: Payer: Medicare HMO

## 2019-12-11 ENCOUNTER — Other Ambulatory Visit: Payer: Self-pay

## 2019-12-11 DIAGNOSIS — E785 Hyperlipidemia, unspecified: Secondary | ICD-10-CM | POA: Diagnosis not present

## 2019-12-11 DIAGNOSIS — E039 Hypothyroidism, unspecified: Secondary | ICD-10-CM | POA: Diagnosis not present

## 2019-12-12 ENCOUNTER — Other Ambulatory Visit: Payer: Self-pay

## 2019-12-12 LAB — COMPLETE METABOLIC PANEL WITH GFR
AG Ratio: 1.8 (calc) (ref 1.0–2.5)
ALT: 10 U/L (ref 6–29)
AST: 15 U/L (ref 10–35)
Albumin: 4.2 g/dL (ref 3.6–5.1)
Alkaline phosphatase (APISO): 64 U/L (ref 37–153)
BUN/Creatinine Ratio: 13 (calc) (ref 6–22)
BUN: 16 mg/dL (ref 7–25)
CO2: 28 mmol/L (ref 20–32)
Calcium: 9.5 mg/dL (ref 8.6–10.4)
Chloride: 100 mmol/L (ref 98–110)
Creat: 1.25 mg/dL — ABNORMAL HIGH (ref 0.60–0.93)
GFR, Est African American: 48 mL/min/{1.73_m2} — ABNORMAL LOW (ref >=60)
GFR, Est Non African American: 41 mL/min/{1.73_m2} — ABNORMAL LOW (ref >=60)
Globulin: 2.3 g/dL (calc) (ref 1.9–3.7)
Glucose, Bld: 110 mg/dL — ABNORMAL HIGH (ref 65–99)
Potassium: 4.5 mmol/L (ref 3.5–5.3)
Sodium: 139 mmol/L (ref 135–146)
Total Bilirubin: 0.9 mg/dL (ref 0.2–1.2)
Total Protein: 6.5 g/dL (ref 6.1–8.1)

## 2019-12-12 LAB — TSH: TSH: 8.24 mIU/L — ABNORMAL HIGH (ref 0.40–4.50)

## 2019-12-12 LAB — LIPID PANEL
Cholesterol: 143 mg/dL (ref <200)
HDL: 37 mg/dL — ABNORMAL LOW (ref >=50)
LDL Cholesterol (Calc): 74 mg/dL (calc)
Non-HDL Cholesterol (Calc): 106 mg/dL (calc) (ref <130)
Total CHOL/HDL Ratio: 3.9 (calc) (ref <5.0)
Triglycerides: 229 mg/dL — ABNORMAL HIGH (ref <150)

## 2019-12-12 MED ORDER — LEVOTHYROXINE SODIUM 175 MCG PO TABS: 175.0000 ug | ORAL_TABLET | Freq: Every day | ORAL | 0 refills | Status: DC

## 2019-12-13 ENCOUNTER — Ambulatory Visit (INDEPENDENT_AMBULATORY_CARE_PROVIDER_SITE_OTHER): Payer: Medicare HMO | Admitting: Nurse Practitioner

## 2019-12-13 ENCOUNTER — Encounter: Payer: Self-pay | Admitting: Nurse Practitioner

## 2019-12-13 ENCOUNTER — Other Ambulatory Visit: Payer: Self-pay

## 2019-12-13 VITALS — BP 128/70 | HR 75 | Temp 97.1°F | Ht 62.0 in | Wt 216.0 lb

## 2019-12-13 DIAGNOSIS — R413 Other amnesia: Secondary | ICD-10-CM | POA: Diagnosis not present

## 2019-12-13 DIAGNOSIS — F39 Unspecified mood [affective] disorder: Secondary | ICD-10-CM

## 2019-12-13 DIAGNOSIS — N3281 Overactive bladder: Secondary | ICD-10-CM

## 2019-12-13 DIAGNOSIS — M5441 Lumbago with sciatica, right side: Secondary | ICD-10-CM | POA: Diagnosis not present

## 2019-12-13 DIAGNOSIS — I25708 Atherosclerosis of coronary artery bypass graft(s), unspecified, with other forms of angina pectoris: Secondary | ICD-10-CM | POA: Diagnosis not present

## 2019-12-13 DIAGNOSIS — E785 Hyperlipidemia, unspecified: Secondary | ICD-10-CM

## 2019-12-13 DIAGNOSIS — I10 Essential (primary) hypertension: Secondary | ICD-10-CM | POA: Diagnosis not present

## 2019-12-13 DIAGNOSIS — E039 Hypothyroidism, unspecified: Secondary | ICD-10-CM | POA: Diagnosis not present

## 2019-12-13 MED ORDER — LEVOTHYROXINE SODIUM 88 MCG PO TABS: 88.0000 ug | ORAL_TABLET | Freq: Every day | ORAL | 0 refills | Status: DC

## 2019-12-13 MED ORDER — LEVOTHYROXINE SODIUM 100 MCG PO TABS: 100.0000 ug | ORAL_TABLET | Freq: Every day | ORAL | 0 refills | Status: DC

## 2019-12-13 NOTE — Patient Instructions (Addendum)
Due for TDAP- to get at local pharmacy    To work on diet, low sugar, decrease cheese and milk  Follow up in 3 months with fasting labs prior   Make sure you are staying hydrated

## 2019-12-13 NOTE — Progress Notes (Signed)
Careteam: Patient Care Team: Lauree Chandler, NP as PCP - General (Geriatric Medicine) Lorretta Harp, MD as PCP - Cardiology (Cardiology) Irene Shipper, MD as Consulting Physician (Gastroenterology) Frederik Pear, MD as Consulting Physician (Orthopedic Surgery) Fontaine, Belinda Block, MD (Inactive) as Consulting Physician (Gynecology) Rutherford Guys, MD as Consulting Physician (Ophthalmology) Bjorn Loser, MD as Consulting Physician (Urology) Serafina Mitchell, MD as Consulting Physician (Vascular Surgery) Cameron Sprang, MD as Consulting Physician (Neurology) Desma Maxim, MD as Referring Physician (Ophthalmology) Thayer Headings, PMHNP as Nurse Practitioner (Psychiatry) Cameron Sprang, MD as Consulting Physician (Neurology) Standley Brooking, LCSW as Social Worker  PLACE OF SERVICE:  [facility] Directive information Does Patient Have a Medical Advance Directive?: No, Would patient like information on creating a medical advance directive?: Yes (MAU/Ambulatory/Procedural Areas - Information given)(Given at previous visit)  Allergies  Allergen Reactions  . Donepezil Other (See Comments)    Altered mood, aggression, and caused anger  . Duloxetine Hcl     Increased confusion and memory concerns   . Hydrocodone-Acetaminophen Other (See Comments)    "Delirium, confusion, toxic dementia"  . Meloxicam     Pt suspects that this medicine causes fluid on her lungs.   . Memantine Other (See Comments)    Caused severe aggression  . 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 Vicodin [Hydrocodone-Acetaminophen] Other (See Comments)    Delirium, confusion, and toxic dementia    Chief Complaint  Patient presents with  . Medical Management of Chronic Issues    4 month follow-up and discuss labs (copy printed)   . Immunizations    Discuss need for TD   . Advanced Directive    Discuss MOST, DNR, and HCPOA      HPI: Patient is a 79 y.o. female for routine follow up.  Had back injection does not feel like it has helped much, lack of motivation to get up and do anything.   OAB- was off medication for 2-3 weeks and bladder was awful, just able to restart but already helping.   Advance directives- daughter and pt thinking about moving, a lot going on and income an issue. Does not wish to look over advance directives or think about this at this time. Does not wish to complete MOST form.   Hypothyroid- has been taking synthroid with food and other medications because she does not remember to take it alone. When she was on 200 mcg TSH was low and now she is on 175 mcg with elevated TSH.   Memory loss- stable, daughter is her primary caregiver/help    Review of Systems:  Review of Systems  Constitutional: Negative for chills, fever and weight loss.  HENT: Negative for tinnitus.   Eyes: Negative for discharge.  Respiratory: Negative for cough, sputum production and shortness of breath.   Cardiovascular: Negative for chest pain, palpitations and leg swelling.  Gastrointestinal: Negative for abdominal pain, constipation, diarrhea and heartburn.  Genitourinary: Positive for frequency. Negative for dysuria and urgency.  Musculoskeletal: Positive for back pain. Negative for falls, joint pain and myalgias.  Skin: Negative.   Neurological: Negative for dizziness and headaches.  Psychiatric/Behavioral: Positive for memory loss. Negative for depression. The patient does not have insomnia.     Past Medical History:  Diagnosis Date  . AMI 11/17/2008   Qualifier: Diagnosis of  By: Marland Mcalpine    . Anxiety   .  Atherosclerotic peripheral vascular disease ([place])   . Bacteremia, escherichia coli 04/27/2015  . Brachial-basilar insufficiency syndrome   . CAD (coronary artery disease)    followed by dr [name].  . Celiac artery stenosis ([place])   . CHF (congestive heart failure) ([place]) 11/14/2017  .  Chronic bilateral low back pain without sciatica   . Cognitive impairment   . Colon polyps   . Community acquired pneumonia   . Depression   . Diverticulosis   . Dysuria   . Encephalomalacia   . GAD (generalized anxiety disorder)   . GERD (gastroesophageal reflux disease)   . Glaucoma   . Hearing loss   . Hemorrhoids   . Hypertension   . Hypothyroidism   . Incontinence   . Mixed hyperlipidemia   . Myocardial infarction ([place])   . OSA on CPAP   . Peripheral arterial disease ([place])   . Pneumonitis 04/26/2015  . Pyelonephritis   . Pyelonephritis due to Escherichia coli 04/28/2015  . Sepsis ([place])   . Sinus drainage    took z-pack   finished yesterday  . Spondylosis of lumbar region without myelopathy or radiculopathy   . 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[place])   . Urticaria   . Vertigo, benign positional    Past Surgical History:  Procedure Laterality Date  . ABDOMINAL AORTOGRAM W/LOWER EXTREMITY N/A 08/22/2017   Procedure: ABDOMINAL AORTOGRAM W/LOWER EXTREMITY;  Surgeon: Serafina Mitchell, MD;  Location: [facility];  Service: Cardiovascular;  Laterality: N/A;  . BILATERAL UPPER EXTREMITY ANGIOGRAM N/A 09/11/2012   Procedure: BILATERAL UPPER EXTREMITY ANGIOGRAM;  Surgeon: Serafina Mitchell, MD;  Location: [facility] CATH LAB;  Service: Cardiovascular;  Laterality: N/A;  . carotid duplex doppler Bilateral 09/03/2012, 11/03/2011   Evidence of 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 patent left common carotid- subclavian artery bypass with turbulent flow noted at the anastomosis with velocities of 295 cm/s  . CAROTID-SUBCLAVIAN BYPASS GRAFT  12/15/2011   Procedure: BYPASS GRAFT CAROTID-SUBCLAVIAN;  Surgeon: Serafina Mitchell, MD;  Location: [facility] OR;  Service: Vascular;  Laterality: Left;  Left Carotid subclavian bypass  . CORONARY ANGIOPLASTY    . CORONARY  ARTERY BYPASS GRAFT    . DOPPLER ECHOCARDIOGRAPHY  05/27/2010, 09/17/2008   Mild Proximal septal thickening is noted. Left ventricular systolic functions is normal ejection fraction =>55%. the aortic valve appears to be mildly sclerotic   . fem-fem bypass graft  1999  . holter monitor  01/21/2008   The predominant rhythm was normal sinus rhythm. Minimum heartrate of 63 bpm at +01:00, maximum heartrate of 105 bpm at + 10:35; and the average heartrate of 75 bpm. Ventricular ectopic activity totaled 1270: Multifocal; 866-PVC's and 404-VEs             . JOINT REPLACEMENT     Left knee  . LEFT HEART CATHETERIZATION WITH CORONARY/GRAFT ANGIOGRAM N/A 12/21/2011   Procedure: LEFT HEART CATHETERIZATION WITH Beatrix Fetters;  Surgeon: Leonie Man, MD;  Location: [facility] CATH LAB;  Service: Cardiovascular;  Laterality: N/A;  . NM MYOCAR PERF EJECTION FRACTION  09/22/2009, 07/03/2007   the post stress myocardial perfusion images show a normal pattern of perfusion is all regions. The post-stress ejection fraction is 68 %. no significant wall motion abnormalities noted. This is a low risk scan.  Marland Kitchen PERIPHERAL VASCULAR INTERVENTION  08/22/2017   Procedure: PERIPHERAL VASCULAR INTERVENTION;  Surgeon: Serafina Mitchell, MD;  Location: [facility];  Service: Cardiovascular;;  Fem/Fem Graft  . REPLACEMENT TOTAL KNEE  05-2011  . UNILATERAL UPPER EXTREMEITY ANGIOGRAM Left 11/15/2011   Procedure: UNILATERAL UPPER EXTREMEITY ANGIOGRAM;  Surgeon: Lorretta Harp, MD;  Location: [facility] CATH LAB;  Service: Cardiovascular;  Laterality: Left;   Social History:   reports that she quit smoking about 38 years ago. Her smoking use included cigarettes. She quit after 3.00 years of use. She has never used smokeless tobacco. She reports previous alcohol use. She reports that she does not use drugs.  Family History  Problem Relation Age of Onset  . Heart attack Mother   . Heart disease Mother        before age 62  .  Diabetes Father   . Heart disease Father   . Hypertension Father   . Hyperlipidemia Father   . Heart attack Father 68  . Heart attack Brother 36  . Cerebral palsy Sister 71  . Congestive Heart Failure Sister 37  . Heart attack Sister 61  . Hypertension Sister 25  . Dementia Sister   . Anxiety disorder Sister   . Anxiety disorder Sister 5  . Heart Problems Sister   . Stroke Sister   . Heart Problems Sister 9  . Heart attack Sister 51  . Colon cancer Brother 51  . Prostate cancer Brother 1  . Hypertension Daughter   . Irritable bowel syndrome Daughter   . Depression Daughter   . Anxiety disorder Daughter     Medications: Patient's Medications  New Prescriptions   No medications on file  Previous Medications   ACETAMINOPHEN (TYLENOL) 325 MG TABLET    1 by mouth in the morning and 1 by mouth in the pm.   ALPRAZOLAM (XANAX) 0.5 MG TABLET    Take 1 tablet (0.5 mg total) by mouth daily as needed for anxiety.   AMLODIPINE (NORVASC) 5 MG TABLET    TAKE 1 TABLET EVERY DAY   ASPIRIN EC 81 MG TABLET    Take 81 mg by mouth at bedtime.    BRIMONIDINE (ALPHAGAN) 0.2 % OPHTHALMIC SOLUTION    Place 1 drop into both eyes 2 (two) times daily.   CHOLECALCIFEROL (VITAMIN D) 400 UNITS TABS TABLET    Take 400 Units by mouth daily.   CLOPIDOGREL (PLAVIX) 75 MG TABLET    TAKE 1 TABLET (75 MG TOTAL) BY MOUTH DAILY.   DIVALPROEX (DEPAKOTE ER) 250 MG 24 HR TABLET    TAKE 1 TABLET(250 MG) BY MOUTH TWICE DAILY   DORZOLAMIDE (TRUSOPT) 2 % OPHTHALMIC SOLUTION    Place 1 drop into both eyes 2 (two) times daily.   ESCITALOPRAM (LEXAPRO) 20 MG TABLET    TAKE 1 TABLET(20 MG) BY MOUTH DAILY   FUROSEMIDE (LASIX) 40 MG TABLET    Take 1 tablet (40 mg total) by mouth daily as needed. Take 1 tablet if you have more than a 2 pound weight gain in 24 hours as needed.   GABAPENTIN (NEURONTIN) 100 MG CAPSULE    Take 1 capsule (100 mg total) by mouth 2 (two) times daily as needed.   LATANOPROST (XALATAN) 0.005 %  OPHTHALMIC SOLUTION    Place 1 drop into both eyes at bedtime.   LEVOTHYROXINE (SYNTHROID) 175 MCG TABLET    Take 1 tablet (175 mcg total) by mouth daily before breakfast.   MECLIZINE (ANTIVERT) 25 MG TABLET    Take 1 tablet (25 mg total) by mouth as needed for dizziness.  METOPROLOL TARTRATE (LOPRESSOR) 25 MG TABLET    TAKE 1 TABLET TWICE DAILY   MIRABEGRON ER (MYRBETRIQ) 25 MG TB24 TABLET    Take 25 mg by mouth daily.    PANTOPRAZOLE (PROTONIX) 40 MG TABLET    TAKE 1 TABLET EVERY DAY   PROMETHAZINE (PHENERGAN) 12.5 MG TABLET    Take 12.5 mg by mouth every 6 (six) hours as needed for nausea or vomiting.   ROSUVASTATIN (CRESTOR) 40 MG TABLET    Take 1 tablet (40 mg total) by mouth daily.   TIZANIDINE (ZANAFLEX) 2 MG TABLET    Take 1 tablet (2 mg total) by mouth every 6 (six) hours as needed for muscle spasms.   VALSARTAN (DIOVAN) 80 MG TABLET    TAKE 1 TABLET EVERY MORNING  Modified Medications   No medications on file  Discontinued Medications   No medications on file    Physical Exam:  Vitals:   12/13/19 1416  BP: 128/70  Pulse: 75  Temp: (!) 97.1 F (36.2 C)  TempSrc: Temporal  SpO2: 97%  Weight: 216 lb (98 kg)  Height: 5\' 2"  (1.575 m)   Body mass index is 39.51 kg/m. Wt Readings from Last 3 Encounters:  12/13/19 216 lb (98 kg)  08/16/19 210 lb (95.3 kg)  04/08/19 199 lb (90.3 kg)    Physical Exam Constitutional:      General: She is not in acute distress.    Appearance: She is well-developed. She is not diaphoretic.  HENT:     Head: Normocephalic and atraumatic.  Eyes:     Conjunctiva/sclera: Conjunctivae normal.     Pupils: Pupils are equal, round, and reactive to light.  Cardiovascular:     Rate and Rhythm: Normal rate and regular rhythm.     Heart sounds: Normal heart sounds.  Pulmonary:     Effort: Pulmonary effort is normal.     Breath sounds: Normal breath sounds.  Abdominal:     General: Bowel sounds are normal.     Palpations: Abdomen is soft.   Musculoskeletal:        General: No tenderness.     Cervical back: Normal range of motion and neck supple.     Right lower leg: No edema.     Left lower leg: No edema.  Skin:    General: Skin is warm and dry.  Neurological:     Mental Status: She is alert and oriented to person, place, and time.     Labs reviewed: Basic Metabolic Panel: Recent Labs    01/11/19 1002 08/16/19 1354 12/11/19 1055  NA 140 138 139  K 4.9 4.5 4.5  CL 102 102 100  CO2 29 25 28   GLUCOSE 106* 100* 110*  BUN 24 13 16   CREATININE 0.99* 1.04* 1.25*  CALCIUM 9.2 9.4 9.5  TSH 4.43 0.13* 8.24*   Liver Function Tests: Recent Labs    01/11/19 1002 08/16/19 1354 12/11/19 1055  AST 12 12 15   ALT 7 10 10   BILITOT 0.7 0.9 0.9  PROT 6.5 6.2 6.5   No results for input(s): LIPASE, AMYLASE in the last 8760 hours. No results for input(s): AMMONIA in the last 8760 hours. CBC: Recent Labs    01/11/19 1002 08/16/19 1354  WBC 6.6 8.5  NEUTROABS 3,656 4,412  HGB 13.0 13.5  HCT 39.1 40.0  MCV 85.7 85.8  PLT 129* 154   Lipid Panel: Recent Labs    01/11/19 1002 08/16/19 1354 12/11/19 1055  CHOL 134 146 143  HDL 35* 33* 37*  LDLCALC 71 83 74  TRIG 211* 206* 229*  CHOLHDL 3.8 4.4 3.9   TSH: Recent Labs    01/11/19 1002 08/16/19 1354 12/11/19 1055  TSH 4.43 0.13* 8.24*   A1C: Lab Results  Component Value Date   HGBA1C 5.6 01/11/2019     Assessment/Plan 1. Acquired hypothyroidism -pt with a lot less motivation and mood has been "off" possibly due to elevated TSH, plan to increase synthroid to 188 mcg (tsh too low on 200 mcg and too high on 175 mcg)  - levothyroxine (SYNTHROID) 100 MCG tablet; Take 1 tablet (100 mcg total) by mouth daily before breakfast. To take with 88 mcg tablet to equal 188 mcg total  Dispense: 90 tablet; Refill: 0 - levothyroxine (SYNTHROID) 88 MCG tablet; Take 1 tablet (88 mcg total) by mouth daily before breakfast. To take with 100 mcg tablet to equal 188 mcg  total  Dispense: 90 tablet; Refill: 0 -TSH  2. Hyperlipidemia LDL goal <70 -triglycerides elevated, daughter and pt admit that she eats too many sweets. Discussed dietary modifications regarding this. Will follow up fasting lipids prior to next OV.  3. Acute right-sided low back pain with right-sided sciatica -ongoing, following with specialist regarding this. Consider changing lexapro to cymbalta at next OV to help with pain.   4. Morbid (severe) obesity due to excess calories ([place]) -ongoing, encouraged weight loss with increase in physical activity and decrease in calories and sweets for overall health benefits   5. Atherosclerosis of coronary artery bypass graft(s), unspecified, with other forms of angina pectoris ([place]) -stable, without chest pains, continues on ASA 81 mg daily with crestor 40 mg   6. Mood disorder ([place]) -ongoing, consider changing  lexapro to cymbalta at future appt.  Continues on lexapro with depakote BID  7. Essential hypertension -controlled on current regimen on norvasc, lopressor and valsartan  8. Overactive bladder -improved  Since restarting myrbetriq   9. Memory loss Stable, she lives with daughter who is her primary caregiver.  Next appt: 3 months with labs prior  Hancel Ion K. Beverly, [facility] [phone]

## 2019-12-16 DIAGNOSIS — N3281 Overactive bladder: Secondary | ICD-10-CM | POA: Insufficient documentation

## 2020-01-03 DIAGNOSIS — N3946 Mixed incontinence: Secondary | ICD-10-CM | POA: Diagnosis not present

## 2020-02-26 ENCOUNTER — Ambulatory Visit (INDEPENDENT_AMBULATORY_CARE_PROVIDER_SITE_OTHER): Payer: Medicare HMO | Admitting: Psychiatry

## 2020-02-26 ENCOUNTER — Encounter: Payer: Self-pay | Admitting: Psychiatry

## 2020-02-26 ENCOUNTER — Other Ambulatory Visit: Payer: Self-pay

## 2020-02-26 DIAGNOSIS — F39 Unspecified mood [affective] disorder: Secondary | ICD-10-CM

## 2020-02-26 DIAGNOSIS — F411 Generalized anxiety disorder: Secondary | ICD-10-CM

## 2020-02-26 MED ORDER — ALPRAZOLAM 0.5 MG PO TABS: 0.5000 mg | ORAL_TABLET | Freq: Every day | ORAL | 1 refills | Status: DC | PRN

## 2020-02-26 MED ORDER — DIVALPROEX SODIUM ER 250 MG PO TB24: ORAL_TABLET | ORAL | 5 refills | Status: DC

## 2020-02-26 MED ORDER — ESCITALOPRAM OXALATE 20 MG PO TABS: ORAL_TABLET | ORAL | 5 refills | Status: DC

## 2020-02-26 NOTE — Progress Notes (Signed)
[name] [id number] 1940-08-21 79 y.o.  Subjective:   Patient ID:  Deanna [name] is a 79 y.o. (DOB 1941/07/26) female.  Chief Complaint:  Chief Complaint  Patient presents with  . Follow-up    h/o anxiety, mood disturbance    HPI Deanna [name] presents to the office today for follow-up of anxiety, mood disturbance and insomnia. She is accompanied by her daughter. Pt's ex-husband died in 17-Oct-2022 and pt is now receiving survivorship benefits. Pt reports that her mood has been "pretty good." Daughter reports that pt sometimes has "cabin fever" with staying at home. Pt ha sbeen trying to help with some light housework. She reports that her energy and motivation have been good. They report that she has been having some anxiety at night and will have tightness in her chest. They report that it occurs some nights and not every night and it starts after 5 pm and seems to escalate until bedtime. They report that Alprazolam seems to help relieve it. She has been having some dreams about deceased ex-husband and reports that at times these dreams can be upsetting. Denies difficulty falling or staying asleep. She reports that her anxiety is typically well controlled during the daytime. Appetite has been good. Daughter reports that pt continues to have some gradual cognitive decline. Daughter reports that pt has stopped watching some TV shows because she says it is boring and seems to have trouble following it. Now spends more time listening to music. Denies SI.    Past Psychiatric Medication Trials: Sertraline-initially helpful and then no longer effective Cymbalta-increased blood pressure Prozac-possible adverse reaction Lexapro Depakote Ativan-effective but caused excessive drowsiness Xanax-effective Aricept-anger Namenda-anger Gabapentin- limited improvement unless she takes higher doses that cause somnolence.    Mini-Mental     Office Visit from 03/07/2018 in [facility]  [place] Visit from 05/30/2014 in [facility]  Total Score (max 30 points ) 29 29    PHQ2-9     Office Visit from 11/29/2017 in [facility] Patient Outreach from 11/17/2015 in [facility] from 10/15/2015 in [facility] from 09/16/2015 in [facility] Telephone from 07/13/2015 in [place]  PHQ-2 Total Score 6 1 1 1 1   PHQ-9 Total Score 18 -- -- -- --       Review of Systems:  Review of Systems  Musculoskeletal: Positive for back pain and gait problem.  Neurological: Positive for tremors.       Daughter reports that there has been no change in tremor. Rare dizziness  Psychiatric/Behavioral:       Please refer to HPI    Medications: I have reviewed the patient's current medications.  Current Outpatient Medications  Medication Sig Dispense Refill  . acetaminophen (TYLENOL) 325 MG tablet 1 by mouth in the morning and 1 by mouth in the pm.    . ALPRAZolam (XANAX) 0.5 MG tablet Take 1 tablet (0.5 mg total) by mouth daily as needed for anxiety. 30 tablet 1  . amLODipine (NORVASC) 5 MG tablet TAKE 1 TABLET EVERY DAY 90 tablet 1  . aspirin EC 81 MG tablet Take 81 mg by mouth at bedtime.     . brimonidine (ALPHAGAN) 0.2 % ophthalmic solution Place 1 drop into both eyes 2 (two) times daily.    . cholecalciferol (VITAMIN D) 400 units TABS tablet Take 400 Units by mouth daily.    . clopidogrel (PLAVIX) 75 MG tablet TAKE 1 TABLET (  75 MG TOTAL) BY MOUTH DAILY. 90 tablet 1  . divalproex (DEPAKOTE ER) 250 MG 24 hr tablet TAKE 1 TABLET(250 MG) BY MOUTH TWICE DAILY 60 tablet 5  . dorzolamide (TRUSOPT) 2 % ophthalmic solution Place 1 drop into both eyes 2 (two) times daily.    Marland Kitchen escitalopram (LEXAPRO) 20 MG tablet TAKE 1 TABLET(20 MG) BY MOUTH DAILY 30 tablet 5  . furosemide (LASIX) 40 MG tablet Take 1 tablet (40 mg total) by mouth daily as needed. Take 1 tablet if you  have more than a 2 pound weight gain in 24 hours as needed. 30 tablet 11  . gabapentin (NEURONTIN) 100 MG capsule Take 1 capsule (100 mg total) by mouth 2 (two) times daily as needed. 60 capsule 3  . latanoprost (XALATAN) 0.005 % ophthalmic solution Place 1 drop into both eyes at bedtime.    Marland Kitchen levothyroxine (SYNTHROID) 100 MCG tablet Take 1 tablet (100 mcg total) by mouth daily before breakfast. To take with 88 mcg tablet to equal 188 mcg total 90 tablet 0  . levothyroxine (SYNTHROID) 88 MCG tablet Take 1 tablet (88 mcg total) by mouth daily before breakfast. To take with 100 mcg tablet to equal 188 mcg total 90 tablet 0  . meclizine (ANTIVERT) 25 MG tablet Take 1 tablet (25 mg total) by mouth as needed for dizziness. 30 tablet 0  . metoprolol tartrate (LOPRESSOR) 25 MG tablet TAKE 1 TABLET TWICE DAILY 180 tablet 1  . mirabegron ER (MYRBETRIQ) 25 MG TB24 tablet Take 25 mg by mouth daily.     . pantoprazole (PROTONIX) 40 MG tablet TAKE 1 TABLET EVERY DAY 90 tablet 1  . promethazine (PHENERGAN) 12.5 MG tablet Take 12.5 mg by mouth every 6 (six) hours as needed for nausea or vomiting.    . rosuvastatin (CRESTOR) 40 MG tablet Take 1 tablet (40 mg total) by mouth daily. 90 tablet 3  . tiZANidine (ZANAFLEX) 2 MG tablet Take 1 tablet (2 mg total) by mouth every 6 (six) hours as needed for muscle spasms. 30 tablet 5  . valsartan (DIOVAN) 80 MG tablet TAKE 1 TABLET EVERY MORNING 90 tablet 1   No current facility-administered medications for this visit.    Medication Side Effects: Other: Tremor  Allergies:  Allergies  Allergen Reactions  . Donepezil Other (See Comments)    Altered mood, aggression, and caused anger  . Duloxetine Hcl     Increased confusion and memory concerns   . Hydrocodone-Acetaminophen Other (See Comments)    "Delirium, confusion, toxic dementia"  . Meloxicam     Pt suspects that this medicine causes fluid on her lungs.   . Memantine Other (See Comments)    Caused severe  aggression  . 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 Vicodin [Hydrocodone-Acetaminophen] Other (See Comments)    Delirium, confusion, and toxic dementia    Past Medical History:  Diagnosis Date  . AMI 11/17/2008   Qualifier: Diagnosis of  By: Marland Mcalpine    . Anxiety   . Atherosclerotic peripheral vascular disease ([place])   . Bacteremia, escherichia coli 04/27/2015  . Brachial-basilar insufficiency syndrome   . CAD (coronary artery disease)    followed by dr [name].  . Celiac artery stenosis ([place])   . CHF (congestive heart failure) ([place]) 11/14/2017  . Chronic bilateral low back pain without sciatica   . Cognitive impairment   . Colon polyps   . Community  acquired pneumonia   . Depression   . Diverticulosis   . Dysuria   . Encephalomalacia   . GAD (generalized anxiety disorder)   . GERD (gastroesophageal reflux disease)   . Glaucoma   . Hearing loss   . Hemorrhoids   . Hypertension   . Hypothyroidism   . Incontinence   . Mixed hyperlipidemia   . Myocardial infarction ([place])   . OSA on CPAP   . Peripheral arterial disease ([place])   . Pneumonitis 04/26/2015  . Pyelonephritis   . Pyelonephritis due to Escherichia coli 04/28/2015  . Sepsis ([place])   . Sinus drainage    took z-pack   finished yesterday  . Spondylosis of lumbar region without myelopathy or radiculopathy   . 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[place])   . Urticaria   . Vertigo, benign positional     Family History  Problem Relation Age of Onset  . Heart attack Mother   . Heart disease Mother        before age 58  . Diabetes Father   . Heart disease Father   . Hypertension Father   . Hyperlipidemia Father   . Heart attack Father 57  . Heart attack Brother 43  . Cerebral palsy Sister 93  . Congestive Heart Failure Sister 57  . Heart attack Sister 34  . Hypertension Sister 68  . Dementia Sister   . Anxiety disorder Sister   . Anxiety disorder Sister  56  . Heart Problems Sister   . Stroke Sister   . Heart Problems Sister 70  . Heart attack Sister 12  . Colon cancer Brother 11  . Prostate cancer Brother 87  . Hypertension Daughter   . Irritable bowel syndrome Daughter   . Depression Daughter   . Anxiety disorder Daughter     Social History   Socioeconomic History  . Marital status: Divorced    Spouse name: Not on file  . Number of children: Not on file  . Years of education: Not on file  . Highest education level: Not on file  Occupational History  . Not on file  Tobacco Use  . Smoking status: Former Smoker    Years: 3.00    Types: Cigarettes    Quit date: 11/27/1981    Years since quitting: 38.2  . Smokeless tobacco: Never Used  Vaping Use  . Vaping Use: Never used  Substance and Sexual Activity  . Alcohol use: Not Currently    Alcohol/week: 0.0 standard drinks  . Drug use: No  . Sexual activity: Not Currently    Comment: 1st intercourse 72 yo-1 partner  Other Topics Concern  . Not on file  Social History Narrative   Social History      Diet? Healthy but too many sweets      Do you drink/eat things with caffeine? Yes occasionally      Marital status?                    D                What year were you married?      Do you live in a house, apartment, assisted living, condo, trailer, etc.? condo      Is it one or more stories? 1      How many persons live in your home? 2      Do you have any pets in your home? (please list) 1 cat  Highest level of education completed? 1 year college      Current or past profession: housewife      Do you exercise?           no                           Type & how often?      Advanced Directives      Do you have a living will? no      Do you have a DNR form?             no                     If not, do you want to discuss one? yes      Do you have signed POA/HPOA for forms? no      Functional Status      Do you have difficulty bathing or dressing  yourself? No- gets tired      Do you have difficulty preparing food or eating? No- eats frozen entrees or poor nutrition meals      Do you have difficulty managing your medications? yes      Do you have difficulty managing your finances? yes      Do you have difficulty affording your medications? No- funds running low   Social Determinants of Health   Financial Resource Strain:   . Difficulty of Paying Living Expenses:   Food Insecurity:   . Worried About Charity fundraiser in the Last Year:   . Arboriculturist in the Last Year:   Transportation Needs:   . Film/video editor (Medical):   Marland Kitchen Lack of Transportation (Non-Medical):   Physical Activity:   . Days of Exercise per Week:   . Minutes of Exercise per Session:   Stress:   . Feeling of Stress :   Social Connections:   . Frequency of Communication with Friends and Family:   . Frequency of Social Gatherings with Friends and Family:   . Attends Religious Services:   . Active Member of Clubs or Organizations:   . Attends Archivist Meetings:   Marland Kitchen Marital Status:   Intimate Partner Violence:   . Fear of Current or Ex-Partner:   . Emotionally Abused:   Marland Kitchen Physically Abused:   . Sexually Abused:     Past Medical History, Surgical history, Social history, and Family history were reviewed and updated as appropriate.   Please see review of systems for further details on the patient's review from today.   Objective:   Physical Exam:  Pulse 75   Wt 215 lb (97.5 kg)   BMI 39.32 kg/m   Physical Exam Constitutional:      General: She is not in acute distress. Musculoskeletal:        General: No deformity.  Neurological:     Mental Status: She is alert and oriented to person, place, and time.     Coordination: Coordination normal.  Psychiatric:        Attention and Perception: Attention and perception normal. She does not perceive auditory or visual hallucinations.        Mood and Affect: Mood normal. Mood  is not anxious or depressed. Affect is not labile, blunt, angry or inappropriate.        Speech: Speech normal.        Behavior: Behavior normal.  Thought Content: Thought content normal. Thought content is not paranoid or delusional. Thought content does not include homicidal or suicidal ideation. Thought content does not include homicidal or suicidal plan.        Cognition and Memory: Cognition is impaired. She exhibits impaired recent memory.        Judgment: Judgment normal.     Comments: Insight intact     Lab Review:     Component Value Date/Time   NA 139 12/11/2019 1055   K 4.5 12/11/2019 1055   CL 100 12/11/2019 1055   CO2 28 12/11/2019 1055   GLUCOSE 110 (H) 12/11/2019 1055   BUN 16 12/11/2019 1055   CREATININE 1.25 (H) 12/11/2019 1055   CALCIUM 9.5 12/11/2019 1055   PROT 6.5 12/11/2019 1055   ALBUMIN 4.6 12/18/2015 1000   AST 15 12/11/2019 1055   ALT 10 12/11/2019 1055   ALKPHOS 81 12/18/2015 1000   BILITOT 0.9 12/11/2019 1055   GFRNONAA 41 (L) 12/11/2019 1055   GFRAA 48 (L) 12/11/2019 1055       Component Value Date/Time   WBC 8.5 08/16/2019 1354   RBC 4.66 08/16/2019 1354   HGB 13.5 08/16/2019 1354   HCT 40.0 08/16/2019 1354   PLT 154 08/16/2019 1354   MCV 85.8 08/16/2019 1354   MCH 29.0 08/16/2019 1354   MCHC 33.8 08/16/2019 1354   RDW 13.7 08/16/2019 1354   LYMPHSABS 3,349 08/16/2019 1354   MONOABS 0.5 04/27/2015 0310   EOSABS 34 08/16/2019 1354   BASOSABS 43 08/16/2019 1354    No results found for: POCLITH, LITHIUM   No results found for: PHENYTOIN, PHENOBARB, VALPROATE, CBMZ   .res Assessment: Plan:   Discussed that recent intermittent anxiety at night and vivid dreams are most likely related to processing the death of her ex-husband.  Recommend continuing to use Xanax as needed for anxiety and to contact office if anxiety worsens. Continue Depakote ER 250 mg twice daily for mood stabilization. Continue Lexapro 20 mg daily for anxiety and  depression. Continue Xanax as needed for anxiety. Patient to follow-up in 6 months or sooner if clinically indicated. Patient advised to contact office with any questions, adverse effects, or acute worsening in signs and symptoms.  Shervon was seen today for follow-up.  Diagnoses and all orders for this visit:  Generalized anxiety disorder -     ALPRAZolam (XANAX) 0.5 MG tablet; Take 1 tablet (0.5 mg total) by mouth daily as needed for anxiety. -     escitalopram (LEXAPRO) 20 MG tablet; TAKE 1 TABLET(20 MG) BY MOUTH DAILY  Mood disorder (HCC) -     divalproex (DEPAKOTE ER) 250 MG 24 hr tablet; TAKE 1 TABLET(250 MG) BY MOUTH TWICE DAILY -     escitalopram (LEXAPRO) 20 MG tablet; TAKE 1 TABLET(20 MG) BY MOUTH DAILY     Please see After Visit Summary for patient specific instructions.  Future Appointments  Date Time Provider [place]  03/09/2020 10:30 AM [facility] None  03/18/2020  2:45 PM Lauree Chandler, NP [facility] None  08/27/2020  3:15 PM Thayer Headings, PMHNP [facility] None    No orders of the defined types were placed in this encounter.   -------------------------------

## 2020-03-09 ENCOUNTER — Telehealth: Payer: Self-pay | Admitting: *Deleted

## 2020-03-09 ENCOUNTER — Other Ambulatory Visit: Payer: Medicare HMO

## 2020-03-09 NOTE — Telephone Encounter (Signed)
Deanna [name] called patient to remind her of her lab appointment and patient had to cancel/reschedule due to  [place] Test.

## 2020-03-09 NOTE — Telephone Encounter (Signed)
Okay please have her call us if she needs anything we can always do a virtual visit if needed

## 2020-03-11 ENCOUNTER — Telehealth: Payer: Self-pay | Admitting: Psychiatry

## 2020-03-13 ENCOUNTER — Ambulatory Visit: Payer: Medicare HMO | Admitting: Nurse Practitioner

## 2020-03-18 ENCOUNTER — Ambulatory Visit: Payer: Medicare HMO | Admitting: Nurse Practitioner

## 2020-03-18 ENCOUNTER — Other Ambulatory Visit: Payer: Self-pay | Admitting: Physician Assistant

## 2020-03-18 ENCOUNTER — Other Ambulatory Visit: Payer: Self-pay

## 2020-03-18 ENCOUNTER — Telehealth: Payer: Self-pay

## 2020-03-18 ENCOUNTER — Inpatient Hospital Stay (HOSPITAL_COMMUNITY)
Admission: EM | Admit: 2020-03-18 | Discharge: 2020-04-01 | DRG: 246 | Disposition: A | Discharge status: Skilled Nursing Facility | Payer: Medicare HMO | Attending: Cardiology | Admitting: Cardiology

## 2020-03-18 ENCOUNTER — Emergency Department (HOSPITAL_COMMUNITY): Payer: Medicare HMO

## 2020-03-18 ENCOUNTER — Encounter (HOSPITAL_COMMUNITY): Payer: Self-pay | Admitting: *Deleted

## 2020-03-18 DIAGNOSIS — R0602 Shortness of breath: Secondary | ICD-10-CM | POA: Diagnosis not present

## 2020-03-18 DIAGNOSIS — E039 Hypothyroidism, unspecified: Secondary | ICD-10-CM | POA: Diagnosis present

## 2020-03-18 DIAGNOSIS — I340 Nonrheumatic mitral (valve) insufficiency: Secondary | ICD-10-CM | POA: Diagnosis not present

## 2020-03-18 DIAGNOSIS — R5381 Other malaise: Secondary | ICD-10-CM | POA: Diagnosis not present

## 2020-03-18 DIAGNOSIS — I5043 Acute on chronic combined systolic (congestive) and diastolic (congestive) heart failure: Secondary | ICD-10-CM | POA: Diagnosis not present

## 2020-03-18 DIAGNOSIS — R69 Illness, unspecified: Secondary | ICD-10-CM | POA: Diagnosis not present

## 2020-03-18 DIAGNOSIS — Z96652 Presence of left artificial knee joint: Secondary | ICD-10-CM | POA: Diagnosis present

## 2020-03-18 DIAGNOSIS — I5033 Acute on chronic diastolic (congestive) heart failure: Secondary | ICD-10-CM

## 2020-03-18 DIAGNOSIS — I2510 Atherosclerotic heart disease of native coronary artery without angina pectoris: Secondary | ICD-10-CM | POA: Diagnosis not present

## 2020-03-18 DIAGNOSIS — E782 Mixed hyperlipidemia: Secondary | ICD-10-CM | POA: Diagnosis present

## 2020-03-18 DIAGNOSIS — K760 Fatty (change of) liver, not elsewhere classified: Secondary | ICD-10-CM | POA: Diagnosis present

## 2020-03-18 DIAGNOSIS — Z955 Presence of coronary angioplasty implant and graft: Secondary | ICD-10-CM

## 2020-03-18 DIAGNOSIS — R41841 Cognitive communication deficit: Secondary | ICD-10-CM | POA: Diagnosis not present

## 2020-03-18 DIAGNOSIS — I700 Atherosclerosis of aorta: Secondary | ICD-10-CM | POA: Diagnosis not present

## 2020-03-18 DIAGNOSIS — I10 Essential (primary) hypertension: Secondary | ICD-10-CM | POA: Diagnosis not present

## 2020-03-18 DIAGNOSIS — Z79890 Hormone replacement therapy (postmenopausal): Secondary | ICD-10-CM

## 2020-03-18 DIAGNOSIS — D638 Anemia in other chronic diseases classified elsewhere: Secondary | ICD-10-CM | POA: Diagnosis present

## 2020-03-18 DIAGNOSIS — R0902 Hypoxemia: Secondary | ICD-10-CM | POA: Diagnosis not present

## 2020-03-18 DIAGNOSIS — K2960 Other gastritis without bleeding: Secondary | ICD-10-CM | POA: Diagnosis not present

## 2020-03-18 DIAGNOSIS — M47816 Spondylosis without myelopathy or radiculopathy, lumbar region: Secondary | ICD-10-CM | POA: Diagnosis not present

## 2020-03-18 DIAGNOSIS — K2900 Acute gastritis without bleeding: Secondary | ICD-10-CM | POA: Diagnosis not present

## 2020-03-18 DIAGNOSIS — I130 Hypertensive heart and chronic kidney disease with heart failure and stage 1 through stage 4 chronic kidney disease, or unspecified chronic kidney disease: Secondary | ICD-10-CM | POA: Diagnosis present

## 2020-03-18 DIAGNOSIS — I5032 Chronic diastolic (congestive) heart failure: Secondary | ICD-10-CM

## 2020-03-18 DIAGNOSIS — Z7982 Long term (current) use of aspirin: Secondary | ICD-10-CM | POA: Diagnosis not present

## 2020-03-18 DIAGNOSIS — N183 Chronic kidney disease, stage 3 unspecified: Secondary | ICD-10-CM | POA: Diagnosis present

## 2020-03-18 DIAGNOSIS — M160 Bilateral primary osteoarthritis of hip: Secondary | ICD-10-CM | POA: Diagnosis not present

## 2020-03-18 DIAGNOSIS — R109 Unspecified abdominal pain: Secondary | ICD-10-CM | POA: Diagnosis not present

## 2020-03-18 DIAGNOSIS — G9341 Metabolic encephalopathy: Secondary | ICD-10-CM | POA: Diagnosis not present

## 2020-03-18 DIAGNOSIS — I25708 Atherosclerosis of coronary artery bypass graft(s), unspecified, with other forms of angina pectoris: Secondary | ICD-10-CM | POA: Diagnosis not present

## 2020-03-18 DIAGNOSIS — R2681 Unsteadiness on feet: Secondary | ICD-10-CM | POA: Diagnosis not present

## 2020-03-18 DIAGNOSIS — N179 Acute kidney failure, unspecified: Secondary | ICD-10-CM | POA: Diagnosis not present

## 2020-03-18 DIAGNOSIS — M2550 Pain in unspecified joint: Secondary | ICD-10-CM | POA: Diagnosis not present

## 2020-03-18 DIAGNOSIS — I509 Heart failure, unspecified: Secondary | ICD-10-CM | POA: Diagnosis not present

## 2020-03-18 DIAGNOSIS — F411 Generalized anxiety disorder: Secondary | ICD-10-CM | POA: Diagnosis present

## 2020-03-18 DIAGNOSIS — I5041 Acute combined systolic (congestive) and diastolic (congestive) heart failure: Secondary | ICD-10-CM | POA: Diagnosis not present

## 2020-03-18 DIAGNOSIS — K5939 Other megacolon: Secondary | ICD-10-CM | POA: Diagnosis not present

## 2020-03-18 DIAGNOSIS — M6281 Muscle weakness (generalized): Secondary | ICD-10-CM | POA: Diagnosis not present

## 2020-03-18 DIAGNOSIS — K219 Gastro-esophageal reflux disease without esophagitis: Secondary | ICD-10-CM | POA: Diagnosis present

## 2020-03-18 DIAGNOSIS — I252 Old myocardial infarction: Secondary | ICD-10-CM

## 2020-03-18 DIAGNOSIS — I959 Hypotension, unspecified: Secondary | ICD-10-CM | POA: Diagnosis not present

## 2020-03-18 DIAGNOSIS — R278 Other lack of coordination: Secondary | ICD-10-CM | POA: Diagnosis not present

## 2020-03-18 DIAGNOSIS — K3189 Other diseases of stomach and duodenum: Secondary | ICD-10-CM | POA: Diagnosis not present

## 2020-03-18 DIAGNOSIS — Z7902 Long term (current) use of antithrombotics/antiplatelets: Secondary | ICD-10-CM

## 2020-03-18 DIAGNOSIS — J9601 Acute respiratory failure with hypoxia: Secondary | ICD-10-CM | POA: Diagnosis not present

## 2020-03-18 DIAGNOSIS — Z87891 Personal history of nicotine dependence: Secondary | ICD-10-CM

## 2020-03-18 DIAGNOSIS — K449 Diaphragmatic hernia without obstruction or gangrene: Secondary | ICD-10-CM | POA: Diagnosis not present

## 2020-03-18 DIAGNOSIS — R498 Other voice and resonance disorders: Secondary | ICD-10-CM | POA: Diagnosis not present

## 2020-03-18 DIAGNOSIS — Z79899 Other long term (current) drug therapy: Secondary | ICD-10-CM

## 2020-03-18 DIAGNOSIS — K279 Peptic ulcer, site unspecified, unspecified as acute or chronic, without hemorrhage or perforation: Secondary | ICD-10-CM | POA: Diagnosis present

## 2020-03-18 DIAGNOSIS — I517 Cardiomegaly: Secondary | ICD-10-CM | POA: Diagnosis not present

## 2020-03-18 DIAGNOSIS — I739 Peripheral vascular disease, unspecified: Secondary | ICD-10-CM | POA: Diagnosis present

## 2020-03-18 DIAGNOSIS — K5641 Fecal impaction: Secondary | ICD-10-CM | POA: Diagnosis not present

## 2020-03-18 DIAGNOSIS — D62 Acute posthemorrhagic anemia: Secondary | ICD-10-CM | POA: Diagnosis not present

## 2020-03-18 DIAGNOSIS — R1012 Left upper quadrant pain: Secondary | ICD-10-CM | POA: Diagnosis not present

## 2020-03-18 DIAGNOSIS — Z951 Presence of aortocoronary bypass graft: Secondary | ICD-10-CM | POA: Diagnosis not present

## 2020-03-18 DIAGNOSIS — R627 Adult failure to thrive: Secondary | ICD-10-CM | POA: Diagnosis present

## 2020-03-18 DIAGNOSIS — E785 Hyperlipidemia, unspecified: Secondary | ICD-10-CM | POA: Diagnosis present

## 2020-03-18 DIAGNOSIS — Z7401 Bed confinement status: Secondary | ICD-10-CM | POA: Diagnosis not present

## 2020-03-18 DIAGNOSIS — J90 Pleural effusion, not elsewhere classified: Secondary | ICD-10-CM | POA: Diagnosis not present

## 2020-03-18 DIAGNOSIS — Z20822 Contact with and (suspected) exposure to covid-19: Secondary | ICD-10-CM | POA: Diagnosis present

## 2020-03-18 DIAGNOSIS — R1915 Other abnormal bowel sounds: Secondary | ICD-10-CM

## 2020-03-18 DIAGNOSIS — K5730 Diverticulosis of large intestine without perforation or abscess without bleeding: Secondary | ICD-10-CM | POA: Diagnosis not present

## 2020-03-18 DIAGNOSIS — I255 Ischemic cardiomyopathy: Secondary | ICD-10-CM | POA: Diagnosis present

## 2020-03-18 DIAGNOSIS — I214 Non-ST elevation (NSTEMI) myocardial infarction: Secondary | ICD-10-CM | POA: Diagnosis not present

## 2020-03-18 DIAGNOSIS — J9811 Atelectasis: Secondary | ICD-10-CM | POA: Diagnosis not present

## 2020-03-18 DIAGNOSIS — R2689 Other abnormalities of gait and mobility: Secondary | ICD-10-CM | POA: Diagnosis not present

## 2020-03-18 DIAGNOSIS — Z006 Encounter for examination for normal comparison and control in clinical research program: Secondary | ICD-10-CM

## 2020-03-18 DIAGNOSIS — K2950 Unspecified chronic gastritis without bleeding: Secondary | ICD-10-CM | POA: Diagnosis not present

## 2020-03-18 DIAGNOSIS — Z9119 Patient's noncompliance with other medical treatment and regimen: Secondary | ICD-10-CM

## 2020-03-18 DIAGNOSIS — G458 Other transient cerebral ischemic attacks and related syndromes: Secondary | ICD-10-CM | POA: Diagnosis present

## 2020-03-18 DIAGNOSIS — I25810 Atherosclerosis of coronary artery bypass graft(s) without angina pectoris: Secondary | ICD-10-CM | POA: Diagnosis not present

## 2020-03-18 DIAGNOSIS — I4891 Unspecified atrial fibrillation: Secondary | ICD-10-CM | POA: Diagnosis not present

## 2020-03-18 DIAGNOSIS — K5900 Constipation, unspecified: Secondary | ICD-10-CM | POA: Diagnosis not present

## 2020-03-18 DIAGNOSIS — E8881 Metabolic syndrome: Secondary | ICD-10-CM | POA: Diagnosis present

## 2020-03-18 DIAGNOSIS — F418 Other specified anxiety disorders: Secondary | ICD-10-CM | POA: Diagnosis not present

## 2020-03-18 DIAGNOSIS — K259 Gastric ulcer, unspecified as acute or chronic, without hemorrhage or perforation: Secondary | ICD-10-CM | POA: Diagnosis not present

## 2020-03-18 LAB — BASIC METABOLIC PANEL
Anion gap: 14 (ref 5–15)
BUN: 21 mg/dL (ref 8–23)
CO2: 25 mmol/L (ref 22–32)
Calcium: 9.2 mg/dL (ref 8.9–10.3)
Chloride: 100 mmol/L (ref 98–111)
Creatinine, Ser: 1.24 mg/dL — ABNORMAL HIGH (ref 0.44–1.00)
GFR calc Af Amer: 48 mL/min — ABNORMAL LOW (ref >60)
GFR calc non Af Amer: 42 mL/min — ABNORMAL LOW (ref >60)
Glucose, Bld: 152 mg/dL — ABNORMAL HIGH (ref 70–99)
Potassium: 4 mmol/L (ref 3.5–5.1)
Sodium: 139 mmol/L (ref 135–145)

## 2020-03-18 LAB — CBC
HCT: 39.4 % (ref 36.0–46.0)
Hemoglobin: 12.4 g/dL (ref 12.0–15.0)
MCH: 27.9 pg (ref 26.0–34.0)
MCHC: 31.5 g/dL (ref 30.0–36.0)
MCV: 88.7 fL (ref 80.0–100.0)
Platelets: 172 10*3/uL (ref 150–400)
RBC: 4.44 MIL/uL (ref 3.87–5.11)
RDW: 13.7 % (ref 11.5–15.5)
WBC: 7.1 10*3/uL (ref 4.0–10.5)
nRBC: 0 % (ref 0.0–0.2)

## 2020-03-18 LAB — HEPATIC FUNCTION PANEL
ALT: 9 U/L (ref 0–44)
AST: 28 U/L (ref 15–41)
Albumin: 3.5 g/dL (ref 3.5–5.0)
Alkaline Phosphatase: 50 U/L (ref 38–126)
Bilirubin, Direct: 0.3 mg/dL — ABNORMAL HIGH (ref 0.0–0.2)
Indirect Bilirubin: 1.1 mg/dL — ABNORMAL HIGH (ref 0.3–0.9)
Total Bilirubin: 1.4 mg/dL — ABNORMAL HIGH (ref 0.3–1.2)
Total Protein: 5.9 g/dL — ABNORMAL LOW (ref 6.5–8.1)

## 2020-03-18 LAB — MAGNESIUM: Magnesium: 1.8 mg/dL (ref 1.7–2.4)

## 2020-03-18 LAB — BRAIN NATRIURETIC PEPTIDE: B Natriuretic Peptide: 912.2 pg/mL — ABNORMAL HIGH (ref 0.0–100.0)

## 2020-03-18 LAB — HEMOGLOBIN A1C
Hgb A1c MFr Bld: 5.8 % — ABNORMAL HIGH (ref 4.8–5.6)
Mean Plasma Glucose: 119.76 mg/dL

## 2020-03-18 LAB — TSH: TSH: 0.394 u[IU]/mL (ref 0.350–4.500)

## 2020-03-18 LAB — T4, FREE: Free T4: 1.27 ng/dL — ABNORMAL HIGH (ref 0.61–1.12)

## 2020-03-18 LAB — TROPONIN I (HIGH SENSITIVITY)
Troponin I (High Sensitivity): 3720 ng/L (ref <18)
Troponin I (High Sensitivity): 4011 ng/L (ref <18)
Troponin I (High Sensitivity): 3425 ng/L (ref <18)

## 2020-03-18 LAB — SARS CORONAVIRUS 2 BY RT PCR (HOSPITAL ORDER, PERFORMED IN ~~LOC~~ HOSPITAL LAB): SARS Coronavirus 2: NEGATIVE

## 2020-03-18 LAB — POC SARS CORONAVIRUS 2 AG -  ED: SARS Coronavirus 2 Ag: NEGATIVE

## 2020-03-18 MED ORDER — SODIUM CHLORIDE 0.9 % IV SOLN: INTRAVENOUS | Status: DC

## 2020-03-18 MED ORDER — DIVALPROEX SODIUM ER 250 MG PO TB24
250.0000 mg | ORAL_TABLET | Freq: Two times a day (BID) | ORAL | Status: DC
  Administered 2020-03-19 – 2020-04-01 (×25): 250 mg via ORAL
  Filled 2020-03-18 (×31): qty 1

## 2020-03-18 MED ORDER — LATANOPROST 0.005 % OP SOLN
1.0000 [drp] | Freq: Every day | OPHTHALMIC | Status: DC
  Administered 2020-03-20 – 2020-03-31 (×13): 1 [drp] via OPHTHALMIC
  Filled 2020-03-18 (×2): qty 2.5

## 2020-03-18 MED ORDER — ALPRAZOLAM 0.5 MG PO TABS
0.5000 mg | ORAL_TABLET | Freq: Every day | ORAL | Status: DC | PRN
  Administered 2020-03-18 – 2020-03-25 (×6): 0.5 mg via ORAL
  Filled 2020-03-18 (×6): qty 1

## 2020-03-18 MED ORDER — SODIUM CHLORIDE 0.9 % WEIGHT BASED INFUSION: 1.0000 mL/kg/h | INTRAVENOUS | Status: DC

## 2020-03-18 MED ORDER — IRBESARTAN 75 MG PO TABS
75.0000 mg | ORAL_TABLET | Freq: Every day | ORAL | Status: DC
  Administered 2020-03-18: 75 mg via ORAL
  Filled 2020-03-18 (×2): qty 1

## 2020-03-18 MED ORDER — LEVOTHYROXINE SODIUM 100 MCG PO TABS: 100.0000 ug | ORAL_TABLET | Freq: Every day | ORAL | Status: DC

## 2020-03-18 MED ORDER — LEVOTHYROXINE SODIUM 88 MCG PO TABS
188.0000 ug | ORAL_TABLET | Freq: Every day | ORAL | Status: DC
  Administered 2020-03-19 – 2020-04-01 (×14): 188 ug via ORAL
  Filled 2020-03-18 (×15): qty 1

## 2020-03-18 MED ORDER — SODIUM CHLORIDE 0.9% FLUSH: 3.0000 mL | INTRAVENOUS | Status: DC | PRN

## 2020-03-18 MED ORDER — MIRABEGRON ER 25 MG PO TB24
50.0000 mg | ORAL_TABLET | Freq: Every day | ORAL | Status: DC
  Administered 2020-03-18 – 2020-04-01 (×14): 50 mg via ORAL
  Filled 2020-03-18 (×9): qty 2
  Filled 2020-03-18: qty 1
  Filled 2020-03-18: qty 2
  Filled 2020-03-18: qty 1
  Filled 2020-03-18 (×3): qty 2

## 2020-03-18 MED ORDER — AMLODIPINE BESYLATE 5 MG PO TABS
5.0000 mg | ORAL_TABLET | Freq: Every day | ORAL | Status: DC
  Administered 2020-03-18: 5 mg via ORAL
  Filled 2020-03-18: qty 1

## 2020-03-18 MED ORDER — NITROGLYCERIN IN D5W 200-5 MCG/ML-% IV SOLN: 2.0000 ug/min | INTRAVENOUS | Status: DC

## 2020-03-18 MED ORDER — ASPIRIN EC 81 MG PO TBEC: 81.0000 mg | DELAYED_RELEASE_TABLET | Freq: Every day | ORAL | Status: DC

## 2020-03-18 MED ORDER — NITROGLYCERIN IN D5W 200-5 MCG/ML-% IV SOLN
0.0000 ug/min | INTRAVENOUS | Status: DC
  Administered 2020-03-18: 5 ug/min via INTRAVENOUS

## 2020-03-18 MED ORDER — ACETAMINOPHEN 325 MG PO TABS
325.0000 mg | ORAL_TABLET | ORAL | Status: DC | PRN
  Administered 2020-03-22: 325 mg via ORAL
  Filled 2020-03-18 (×2): qty 1

## 2020-03-18 MED ORDER — HEPARIN (PORCINE) 25000 UT/250ML-% IV SOLN
800.0000 [IU]/h | INTRAVENOUS | Status: DC
  Administered 2020-03-18: 800 [IU]/h via INTRAVENOUS
  Filled 2020-03-18: qty 250

## 2020-03-18 MED ORDER — SODIUM CHLORIDE 0.9% FLUSH
3.0000 mL | Freq: Two times a day (BID) | INTRAVENOUS | Status: DC
  Administered 2020-03-19 – 2020-03-27 (×9): 3 mL via INTRAVENOUS

## 2020-03-18 MED ORDER — ASPIRIN EC 81 MG PO TBEC
81.0000 mg | DELAYED_RELEASE_TABLET | Freq: Every day | ORAL | Status: DC
  Administered 2020-03-20 – 2020-04-01 (×12): 81 mg via ORAL
  Filled 2020-03-18 (×12): qty 1

## 2020-03-18 MED ORDER — PANTOPRAZOLE SODIUM 40 MG PO TBEC
40.0000 mg | DELAYED_RELEASE_TABLET | Freq: Every day | ORAL | Status: DC
  Administered 2020-03-18 – 2020-03-24 (×7): 40 mg via ORAL
  Filled 2020-03-18 (×7): qty 1

## 2020-03-18 MED ORDER — SODIUM CHLORIDE 0.9 % WEIGHT BASED INFUSION
3.0000 mL/kg/h | INTRAVENOUS | Status: DC
  Administered 2020-03-19: 3 mL/kg/h via INTRAVENOUS

## 2020-03-18 MED ORDER — ASPIRIN 81 MG PO CHEW: 324.0000 mg | CHEWABLE_TABLET | ORAL | Status: DC

## 2020-03-18 MED ORDER — VITAMIN B-12 1000 MCG PO TABS
1000.0000 ug | ORAL_TABLET | Freq: Every day | ORAL | Status: DC
  Administered 2020-03-18 – 2020-04-01 (×14): 1000 ug via ORAL
  Filled 2020-03-18 (×14): qty 1

## 2020-03-18 MED ORDER — TIZANIDINE HCL 2 MG PO TABS
2.0000 mg | ORAL_TABLET | Freq: Four times a day (QID) | ORAL | Status: DC | PRN
  Administered 2020-03-22 – 2020-03-23 (×3): 2 mg via ORAL
  Filled 2020-03-18 (×4): qty 1

## 2020-03-18 MED ORDER — BRIMONIDINE TARTRATE 0.2 % OP SOLN
1.0000 [drp] | Freq: Two times a day (BID) | OPHTHALMIC | Status: DC
  Administered 2020-03-20 – 2020-03-31 (×21): 1 [drp] via OPHTHALMIC
  Filled 2020-03-18 (×3): qty 5

## 2020-03-18 MED ORDER — ACETAMINOPHEN 325 MG PO TABS
650.0000 mg | ORAL_TABLET | ORAL | Status: DC | PRN
  Administered 2020-03-22 – 2020-03-29 (×6): 650 mg via ORAL
  Filled 2020-03-18 (×6): qty 2

## 2020-03-18 MED ORDER — ROSUVASTATIN CALCIUM 20 MG PO TABS
40.0000 mg | ORAL_TABLET | Freq: Every day | ORAL | Status: DC
  Administered 2020-03-18 – 2020-04-01 (×14): 40 mg via ORAL
  Filled 2020-03-18: qty 8
  Filled 2020-03-18 (×13): qty 2

## 2020-03-18 MED ORDER — HEPARIN BOLUS VIA INFUSION
4000.0000 [IU] | Freq: Once | INTRAVENOUS | Status: AC
  Administered 2020-03-18: 4000 [IU] via INTRAVENOUS
  Filled 2020-03-18: qty 4000

## 2020-03-18 MED ORDER — ASPIRIN 300 MG RE SUPP: 300.0000 mg | RECTAL | Status: DC

## 2020-03-18 MED ORDER — DORZOLAMIDE HCL 2 % OP SOLN
1.0000 [drp] | Freq: Two times a day (BID) | OPHTHALMIC | Status: DC
  Administered 2020-03-20 – 2020-04-01 (×22): 1 [drp] via OPHTHALMIC
  Filled 2020-03-18 (×4): qty 10

## 2020-03-18 MED ORDER — CLOPIDOGREL BISULFATE 75 MG PO TABS
75.0000 mg | ORAL_TABLET | Freq: Every day | ORAL | Status: DC
  Administered 2020-03-18 – 2020-04-01 (×14): 75 mg via ORAL
  Filled 2020-03-18 (×14): qty 1

## 2020-03-18 MED ORDER — ASPIRIN 81 MG PO CHEW
81.0000 mg | CHEWABLE_TABLET | ORAL | Status: AC
  Administered 2020-03-19: 81 mg via ORAL
  Filled 2020-03-18: qty 1

## 2020-03-18 MED ORDER — NITROGLYCERIN IN D5W 200-5 MCG/ML-% IV SOLN
0.0000 ug/min | INTRAVENOUS | Status: DC
  Filled 2020-03-18: qty 250

## 2020-03-18 MED ORDER — GABAPENTIN 100 MG PO CAPS
200.0000 mg | ORAL_CAPSULE | Freq: Two times a day (BID) | ORAL | Status: DC | PRN
  Administered 2020-03-20 (×2): 200 mg via ORAL
  Filled 2020-03-18 (×2): qty 2

## 2020-03-18 MED ORDER — ASPIRIN 325 MG PO TABS
325.0000 mg | ORAL_TABLET | Freq: Once | ORAL | Status: AC
  Administered 2020-03-18: 325 mg via ORAL
  Filled 2020-03-18: qty 1

## 2020-03-18 MED ORDER — LEVOTHYROXINE SODIUM 88 MCG PO TABS: 88.0000 ug | ORAL_TABLET | Freq: Every day | ORAL | Status: DC

## 2020-03-18 MED ORDER — ONDANSETRON HCL 4 MG/2ML IJ SOLN: 4.0000 mg | Freq: Four times a day (QID) | INTRAMUSCULAR | Status: DC | PRN

## 2020-03-18 MED ORDER — ESCITALOPRAM OXALATE 10 MG PO TABS
20.0000 mg | ORAL_TABLET | Freq: Every day | ORAL | Status: DC
  Administered 2020-03-18 – 2020-04-01 (×14): 20 mg via ORAL
  Filled 2020-03-18 (×14): qty 2

## 2020-03-18 MED ORDER — METOPROLOL TARTRATE 12.5 MG HALF TABLET
12.5000 mg | ORAL_TABLET | Freq: Two times a day (BID) | ORAL | Status: DC
  Administered 2020-03-18 – 2020-03-19 (×2): 12.5 mg via ORAL
  Filled 2020-03-18 (×3): qty 1

## 2020-03-18 MED ORDER — SODIUM CHLORIDE 0.9 % IV SOLN: 250.0000 mL | INTRAVENOUS | Status: DC | PRN

## 2020-03-18 MED ORDER — NITROGLYCERIN 0.4 MG SL SUBL: 0.4000 mg | SUBLINGUAL_TABLET | SUBLINGUAL | Status: DC | PRN

## 2020-03-18 NOTE — Telephone Encounter (Signed)
It looks like there was a POSITIVE COVID test, if she is having worsening weakness and shortness of breath would recommend being evaluated at the ED or urgent care so they can get xrays and blood work done quickly

## 2020-03-18 NOTE — Progress Notes (Signed)
ANTICOAGULATION CONSULT NOTE - Initial Consult  Pharmacy Consult for heparin Indication: chest pain/ACS  Allergies  Allergen Reactions  . Donepezil Other (See Comments)    Altered mood, aggression, and caused anger  . Duloxetine Hcl     Increased confusion and memory concerns   . Hydrocodone-Acetaminophen Other (See Comments)    "Delirium, confusion, toxic dementia"  . Meloxicam     Pt suspects that this medicine causes fluid on her lungs.   . Memantine Other (See Comments)    Caused severe aggression  . 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 Vicodin [Hydrocodone-Acetaminophen] Other (See Comments)    Delirium, confusion, and toxic dementia    Patient Measurements: Height: 5\' 2"  (157.5 cm) Weight: 77.1 kg (170 lb) IBW/kg (Calculated) : 50.1 Heparin Dosing Weight: 67 kg  Vital Signs: Temp: 98.4 F (36.9 C) (08/11 1536) Temp Source: Oral (08/11 1536) BP: 124/86 (08/11 1644) Pulse Rate: 95 (08/11 1644)  Labs: Recent Labs    03/18/20 1537  HGB 12.4  HCT 39.4  PLT 172  CREATININE 1.24*  TROPONINIHS 3,720*    Estimated Creatinine Clearance: 35.9 mL/min (A) (by C-G formula based on SCr of 1.24 mg/dL (H)).   Medical History: Past Medical History:  Diagnosis Date  . AMI 11/17/2008   Qualifier: Diagnosis of  By: Marland Mcalpine    . Anxiety   . Atherosclerotic peripheral vascular disease ([place])   . Bacteremia, escherichia coli 04/27/2015  . Brachial-basilar insufficiency syndrome   . CAD (coronary artery disease)    followed by dr [name].  . Celiac artery stenosis ([place])   . CHF (congestive heart failure) ([place]) 11/14/2017  . Chronic bilateral low back pain without sciatica   . Cognitive impairment   . Colon polyps   . Community acquired pneumonia   . Depression   . Diverticulosis   . Dysuria   . Encephalomalacia   . GAD (generalized anxiety disorder)   . GERD (gastroesophageal reflux disease)   .  Glaucoma   . Hearing loss   . Hemorrhoids   . Hypertension   . Hypothyroidism   . Incontinence   . Mixed hyperlipidemia   . Myocardial infarction ([place])   . OSA on CPAP   . Peripheral arterial disease ([place])   . Pneumonitis 04/26/2015  . Pyelonephritis   . Pyelonephritis due to Escherichia coli 04/28/2015  . Sepsis ([place])   . Sinus drainage    took z-pack   finished yesterday  . Spondylosis of lumbar region without myelopathy or radiculopathy   . 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[place])   . Urticaria   . Vertigo, benign positional     Medications:   Scheduled:  . aspirin  325 mg Oral Once    Assessment: 79 yo female presents with chest pain and shortness of breath. The patient received 325 mg of aspirin x 1 dose in the emergency department. The patient's high sensitivity troponin was 3,720 upon admission. PTA the patient takes aspirin and clopidogrel but no oral anticoagulation. Pharmacy is now consulted to dose heparin for ACS.  The patient's CBC is WNL and there are no signs or symptoms of bleeding documented.   Goal of Therapy:   Heparin level 0.3-0.7 units/ml Monitor platelets by anticoagulation protocol: Yes   Plan:  Heparin IV 4000 unit bolus x 1 dose followed by 800 units/hr Check an 8-hour heparin level Monitor daily heparin level and CBC Monitor for signs and  symptoms of bleeding  Shauna Hugh, PharmD, [place]  PGY-1 Pharmacy Resident 03/18/2020 5:37 PM  Please check [email or web address] for unit-specific pharmacy phone numbers.

## 2020-03-18 NOTE — Telephone Encounter (Addendum)
Patient's daughter called and had some concerns about her mother and wasn't sure what she should do.  Symptoms are back pain, SOB, wheezing, and weakness.  Daughter stated the back pain has been ongoing and she had ordered a new recliner for her mother. SOB/weakness started yesterday, but the wheezing is new today.   Daughter stated that last week she was sick.  She stated she was not sure what she had, but she did not have COVID.   Patient has documentation of 3 (three) COVID vaccinations.  You have openings this afternoon where I could schedule a televisit.  Please advise.

## 2020-03-18 NOTE — Progress Notes (Signed)
Patient arrived on the unit complaining of 5/10 chest pressure, initiated IV Nitro drip at 5 mcg/min, Falk-Martin, MD paged. Will continue to monitor.  Elaina Hoops, RN

## 2020-03-18 NOTE — ED Provider Notes (Addendum)
[facility] EMERGENCY DEPARTMENT Provider Note   CSN: [id number] Arrival date & time: 03/18/20  1517     History Chief Complaint  Patient presents with  . Shortness of Breath    Deanna [name] is a 79 y.o. female.  Patient reports wheezing and shortness of breath that started yesterday afternoon.  She endorses a having dull crushing going down her back.  Denies any nausea, vomiting or diaphoresis. She reports that she though she was having acid reflux and took Pepcid and Xanax which seemed to help he get some sleep last night. She does not have any orthopnea or lower extremity edema.  Daughter reports that patient oxygen level at home was in 80's.  Daughter also endorses that patient had previous blood clot in Femoral and carotid artery. She takes Plavix and ASA daily, but has not had it today.  She has not had any recent surgeries but per daughter she does sit in the chair most of the day and doesn't mobilize a lot.  The history is provided by the patient and a relative.  Shortness of Breath Associated symptoms: chest pain and wheezing   Associated symptoms: no abdominal pain, no fever, no headaches and no vomiting        Past Medical History:  Diagnosis Date  . AMI 11/17/2008   Qualifier: Diagnosis of  By: Marland Mcalpine    . Anxiety   . Atherosclerotic peripheral vascular disease ([place])   . Bacteremia, escherichia coli 04/27/2015  . Brachial-basilar insufficiency syndrome   . CAD (coronary artery disease)    followed by dr [name].  . Celiac artery stenosis ([place])   . CHF (congestive heart failure) ([place]) 11/14/2017  . Chronic bilateral low back pain without sciatica   . Cognitive impairment   . Colon polyps   . Community acquired pneumonia   . Depression   . Diverticulosis   . Dysuria   . Encephalomalacia   . GAD (generalized anxiety disorder)   . GERD (gastroesophageal reflux disease)   . Glaucoma   . Hearing loss   . Hemorrhoids   .  Hypertension   . Hypothyroidism   . Incontinence   . Mixed hyperlipidemia   . Myocardial infarction ([place])   . OSA on CPAP   . Peripheral arterial disease ([place])   . Pneumonitis 04/26/2015  . Pyelonephritis   . Pyelonephritis due to Escherichia coli 04/28/2015  . Sepsis ([place])   . Sinus drainage    took z-pack   finished yesterday  . Spondylosis of lumbar region without myelopathy or radiculopathy   . 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[place])   . Urticaria   . Vertigo, benign positional     Patient Active Problem List   Diagnosis Date Noted  . Overactive bladder 12/16/2019  . Peripheral arterial disease ([place]) 03/27/2019  . a 06/17/2018  . Mood disorder ([place]) 06/17/2018  . Acute on chronic diastolic CHF (congestive heart failure) ([place]) 11/16/2017  . Hypertensive urgency 11/14/2017  . Thrombocytopenia ([place]) 04/26/2015  . OSA on CPAP 04/26/2015  . Celiac artery stenosis ([place]) 04/26/2015  . Chronic pain 04/26/2015  . Physical deconditioning 04/26/2015  . Critical lower limb ischemia 02/25/2015  . Hyperlipidemia 07/02/2014  . Carotid artery stenosis 03/26/2012  . Pleural effusion, left 12/19/2011  . NSTEMI (non-ST elevated myocardial infarction) ([place]) 12/19/2011  . 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 PERSONAL HX COLONIC POLYPS 06/18/2010  . Hypothyroidism 11/17/2008  . Obesity, Class III, BMI  40-49.9 (morbid obesity) ([place]) 11/17/2008  . ANXIETY DEPRESSION 11/17/2008  . Essential hypertension 11/17/2008  . Atherosclerosis of coronary artery bypass graft(s), unspecified, with other forms of angina pectoris ([place]) 11/17/2008  . HEMORRHOIDS 11/17/2008  . GERD 11/17/2008  . IBS 11/17/2008    Past Surgical History:  Procedure Laterality Date  . ABDOMINAL AORTOGRAM W/LOWER EXTREMITY N/A 08/22/2017   Procedure: ABDOMINAL AORTOGRAM W/LOWER EXTREMITY;  Surgeon: Serafina Mitchell, MD;  Location: [facility];  Service: Cardiovascular;  Laterality: N/A;  .  BILATERAL UPPER EXTREMITY ANGIOGRAM N/A 09/11/2012   Procedure: BILATERAL UPPER EXTREMITY ANGIOGRAM;  Surgeon: Serafina Mitchell, MD;  Location: [facility] CATH LAB;  Service: Cardiovascular;  Laterality: N/A;  . carotid duplex doppler Bilateral 09/03/2012, 11/03/2011   Evidence of 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 patent left common carotid- subclavian artery bypass with turbulent flow noted at the anastomosis with velocities of 295 cm/s  . CAROTID-SUBCLAVIAN BYPASS GRAFT  12/15/2011   Procedure: BYPASS GRAFT CAROTID-SUBCLAVIAN;  Surgeon: Serafina Mitchell, MD;  Location: [facility] OR;  Service: Vascular;  Laterality: Left;  Left Carotid subclavian bypass  . CORONARY ANGIOPLASTY    . CORONARY ARTERY BYPASS GRAFT    . DOPPLER ECHOCARDIOGRAPHY  05/27/2010, 09/17/2008   Mild Proximal septal thickening is noted. Left ventricular systolic functions is normal ejection fraction =>55%. the aortic valve appears to be mildly sclerotic   . fem-fem bypass graft  1999  . holter monitor  01/21/2008   The predominant rhythm was normal sinus rhythm. Minimum heartrate of 63 bpm at +01:00, maximum heartrate of 105 bpm at + 10:35; and the average heartrate of 75 bpm. Ventricular ectopic activity totaled 1270: Multifocal; 866-PVC's and 404-VEs             . JOINT REPLACEMENT     Left knee  . LEFT HEART CATHETERIZATION WITH CORONARY/GRAFT ANGIOGRAM N/A 12/21/2011   Procedure: LEFT HEART CATHETERIZATION WITH Beatrix Fetters;  Surgeon: Leonie Man, MD;  Location: [facility] CATH LAB;  Service: Cardiovascular;  Laterality: N/A;  . NM MYOCAR PERF EJECTION FRACTION  09/22/2009, 07/03/2007   the post stress myocardial perfusion images show a normal pattern of perfusion is all regions. The post-stress ejection fraction is 68 %. no significant wall motion abnormalities noted. This is a low risk scan.   Marland Kitchen PERIPHERAL VASCULAR INTERVENTION  08/22/2017   Procedure: PERIPHERAL VASCULAR INTERVENTION;  Surgeon: Serafina Mitchell, MD;  Location: [facility];  Service: Cardiovascular;;  Fem/Fem Graft  . REPLACEMENT TOTAL KNEE  05-2011  . UNILATERAL UPPER EXTREMEITY ANGIOGRAM Left 11/15/2011   Procedure: UNILATERAL UPPER EXTREMEITY ANGIOGRAM;  Surgeon: Lorretta Harp, MD;  Location: [facility] CATH LAB;  Service: Cardiovascular;  Laterality: Left;     OB History    Gravida  1   Para  1   Term      Preterm      AB      Living  1     SAB      TAB      Ectopic      Multiple      Live Births              Family History  Problem Relation Age of Onset  . Heart attack Mother   . Heart disease Mother        before age 42  . Diabetes Father   .  Heart disease Father   . Hypertension Father   . Hyperlipidemia Father   . Heart attack Father 74  . Heart attack Brother 70  . Cerebral palsy Sister 93  . Congestive Heart Failure Sister 60  . Heart attack Sister 58  . Hypertension Sister 63  . Dementia Sister   . Anxiety disorder Sister   . Anxiety disorder Sister 19  . Heart Problems Sister   . Stroke Sister   . Heart Problems Sister 25  . Heart attack Sister 45  . Colon cancer Brother 34  . Prostate cancer Brother 50  . Hypertension Daughter   . Irritable bowel syndrome Daughter   . Depression Daughter   . Anxiety disorder Daughter     Social History   Tobacco Use  . Smoking status: Former Smoker    Years: 3.00    Types: Cigarettes    Quit date: 11/27/1981    Years since quitting: 38.3  . Smokeless tobacco: Never Used  Vaping Use  . Vaping Use: Never used  Substance Use Topics  . Alcohol use: Not Currently    Alcohol/week: 0.0 standard drinks  . Drug use: No    Home Medications Prior to Admission medications   Medication Sig Start Date End Date Taking? Authorizing Provider  acetaminophen (TYLENOL) 325 MG tablet Take 325 mg by mouth in the morning and at  bedtime. 1 by mouth in the morning and 1 by mouth in the pm.   Yes [provider]  ALPRAZolam (XANAX) 0.5 MG tablet Take 1 tablet (0.5 mg total) by mouth daily as needed for anxiety. 02/26/20  Yes Thayer Headings, PMHNP  amLODipine (NORVASC) 5 MG tablet TAKE 1 TABLET EVERY DAY Patient taking differently: Take 5 mg by mouth daily.  11/12/19  Yes Lauree Chandler, NP  aspirin EC 81 MG tablet Take 81 mg by mouth at bedtime.    Yes [provider]  brimonidine (ALPHAGAN) 0.2 % ophthalmic solution Place 1 drop into both eyes 2 (two) times daily.   Yes [provider]  calcium carbonate (TUMS - DOSED IN MG ELEMENTAL CALCIUM) 500 MG chewable tablet Chew 1 tablet by mouth daily as needed for indigestion or heartburn.   Yes [provider]  cholecalciferol (VITAMIN D) 400 units TABS tablet Take 400 Units by mouth daily.   Yes [provider]  clopidogrel (PLAVIX) 75 MG tablet TAKE 1 TABLET (75 MG TOTAL) BY MOUTH DAILY. 11/25/19  Yes Lorretta Harp, MD  divalproex (DEPAKOTE ER) 250 MG 24 hr tablet TAKE 1 TABLET(250 MG) BY MOUTH TWICE DAILY 02/26/20  Yes Thayer Headings, PMHNP  dorzolamide (TRUSOPT) 2 % ophthalmic solution Place 1 drop into both eyes 2 (two) times daily.   Yes [provider]  escitalopram (LEXAPRO) 20 MG tablet TAKE 1 TABLET(20 MG) BY MOUTH DAILY Patient taking differently: Take 20 mg by mouth daily.  02/26/20  Yes Thayer Headings, PMHNP  gabapentin (NEURONTIN) 100 MG capsule Take 1 capsule (100 mg total) by mouth 2 (two) times daily as needed. Patient taking differently: Take 200 mg by mouth 2 (two) times daily as needed (For nerve pain).  08/16/19  Yes Lauree Chandler, NP  lansoprazole (PREVACID) 15 MG capsule Take 15 mg by mouth daily as needed (For heartburn).   Yes [provider]  latanoprost (XALATAN) 0.005 % ophthalmic solution Place 1 drop into both eyes at bedtime.   Yes [provider]  levothyroxine  (SYNTHROID) 100 MCG tablet Take 1 tablet (  100 mcg total) by mouth daily before breakfast. To take with 88 mcg tablet to equal 188 mcg total 12/13/19  Yes Lauree Chandler, NP  levothyroxine (SYNTHROID) 88 MCG tablet Take 1 tablet (88 mcg total) by mouth daily before breakfast. To take with 100 mcg tablet to equal 188 mcg total 12/13/19  Yes Lauree Chandler, NP  meclizine (ANTIVERT) 25 MG tablet Take 1 tablet (25 mg total) by mouth as needed for dizziness. 11/30/18  Yes Lauree Chandler, NP  metoprolol tartrate (LOPRESSOR) 25 MG tablet TAKE 1 TABLET TWICE DAILY Patient taking differently: Take 12.5 mg by mouth 2 (two) times daily.  11/12/19  Yes Lauree Chandler, NP  mirabegron ER (MYRBETRIQ) 50 MG TB24 tablet Take 50 mg by mouth daily.   Yes [provider]  pantoprazole (PROTONIX) 40 MG tablet TAKE 1 TABLET EVERY DAY Patient taking differently: Take 40 mg by mouth daily.  11/25/19  Yes Lauree Chandler, NP  rosuvastatin (CRESTOR) 40 MG tablet Take 1 tablet (40 mg total) by mouth daily. 08/23/19  Yes Lauree Chandler, NP  tiZANidine (ZANAFLEX) 2 MG tablet Take 1 tablet (2 mg total) by mouth every 6 (six) hours as needed for muscle spasms. 07/01/19  Yes Ngetich, Dinah C, NP  valsartan (DIOVAN) 80 MG tablet TAKE 1 TABLET EVERY MORNING Patient taking differently: Take 80 mg by mouth daily.  11/12/19  Yes Lauree Chandler, NP  vitamin B-12 (CYANOCOBALAMIN) 1000 MCG tablet Take 1,000 mcg by mouth daily.   Yes [provider]  furosemide (LASIX) 40 MG tablet Take 1 tablet (40 mg total) by mouth daily as needed. Take 1 tablet if you have more than a 2 pound weight gain in 24 hours as needed. Patient not taking: Reported on 03/18/2020 11/16/17 11/30/38  Rama, Venetia Maxon, MD    Allergies    Donepezil, Duloxetine hcl, Hydrocodone-acetaminophen, Meloxicam, Memantine, Oxycodone, and Vicodin [hydrocodone-acetaminophen]  Review of Systems   Review of Systems  Constitutional: Negative for  fever.  Respiratory: Positive for chest tightness, shortness of breath and wheezing.   Cardiovascular: Positive for chest pain. Negative for palpitations and leg swelling.  Gastrointestinal: Negative for abdominal pain, blood in stool, constipation, diarrhea, nausea and vomiting.  Genitourinary: Negative for difficulty urinating.  Musculoskeletal: Positive for back pain.  Neurological: Negative for dizziness, speech difficulty and headaches.    Physical Exam Updated Vital Signs BP 118/66   Pulse 88   Temp 98 F (36.7 C) (Oral)   Resp 20   Ht 5\' 2"  (1.575 m)   Wt 77.1 kg   SpO2 94%   BMI 31.09 kg/m   Physical Exam HENT:     Head: Normocephalic.     Mouth/Throat:     Mouth: Mucous membranes are moist.  Eyes:     Pupils: Pupils are equal, round, and reactive to light.  Cardiovascular:     Rate and Rhythm: Normal rate and regular rhythm.     Pulses: Normal pulses.     Heart sounds: Normal heart sounds.  Pulmonary:     Effort: Pulmonary effort is normal.     Breath sounds: Normal breath sounds. No decreased breath sounds, wheezing, rhonchi or rales.  Abdominal:     General: Bowel sounds are normal.     Palpations: Abdomen is soft.  Musculoskeletal:     Cervical back: Normal range of motion and neck supple.  Skin:    General: Skin is warm and dry.     Capillary Refill:  Capillary refill takes less than 2 seconds.  Neurological:     General: No focal deficit present.     Mental Status: She is alert and oriented to person, place, and time.     ED Results / Procedures / Treatments   Labs (all labs ordered are listed, but only abnormal results are displayed) Labs Reviewed  BASIC METABOLIC PANEL - Abnormal; Notable for the following components:      Result Value   Glucose, Bld 152 (*)    Creatinine, Ser 1.24 (*)    GFR calc non Af Amer 42 (*)    GFR calc Af Amer 48 (*)    All other components within normal limits  HEPATIC FUNCTION PANEL - Abnormal; Notable for the  following components:   Total Protein 5.9 (*)    Total Bilirubin 1.4 (*)    Bilirubin, Direct 0.3 (*)    Indirect Bilirubin 1.1 (*)    All other components within normal limits  T4, FREE - Abnormal; Notable for the following components:   Free T4 1.27 (*)    All other components within normal limits  HEMOGLOBIN A1C - Abnormal; Notable for the following components:   Hgb A1c MFr Bld 5.8 (*)    All other components within normal limits  BRAIN NATRIURETIC PEPTIDE - Abnormal; Notable for the following components:   B Natriuretic Peptide 912.2 (*)    All other components within normal limits  TROPONIN I (HIGH SENSITIVITY) - Abnormal; Notable for the following components:   Troponin I (High Sensitivity) 3,720 (*)    All other components within normal limits  TROPONIN I (HIGH SENSITIVITY) - Abnormal; Notable for the following components:   Troponin I (High Sensitivity) 4,011 (*)    All other components within normal limits  SARS CORONAVIRUS 2 BY RT PCR (HOSPITAL ORDER, [place] [facility])  CBC  MAGNESIUM  TSH  CBC  HEPARIN LEVEL (UNFRACTIONATED)  BASIC METABOLIC PANEL  LIPID PANEL  PROTIME-INR  HEPARIN LEVEL (UNFRACTIONATED)  POC SARS CORONAVIRUS 2 AG -  ED  TROPONIN I (HIGH SENSITIVITY)    EKG EKG Interpretation  Date/Time:  Wednesday March 18 2020 17:22:20 EDT Ventricular Rate:  97 PR Interval:  152 QRS Duration: 90 QT Interval:  340 QTC Calculation: 432 R Axis:   62 Text Interpretation: Sinus rhythm Consider anterior infarct Borderline repolarization abnormality unchanged since earlier in the day Confirmed by Wandra Arthurs [phone]) on 03/18/2020 5:43:24 PM   Radiology DG Chest Portable 1 View  Result Date: 03/18/2020 CLINICAL DATA:  Shortness of breath EXAM: PORTABLE CHEST 1 VIEW COMPARISON:  11/14/2017 FINDINGS: Prior CABG. Heart is normal size. No confluent opacities, effusions or edema. No acute bony abnormality. IMPRESSION: No active disease.  Electronically Signed   By: Rolm Baptise M.D.   On: 03/18/2020 18:06    Procedures Procedures (including critical care time)  Medications Ordered in ED Medications  heparin ADULT infusion 100 units/mL (25000 units/2104mL sodium chloride 0.45%) (800 Units/hr Intravenous New Bag/Given 03/18/20 1927)  acetaminophen (TYLENOL) tablet 325 mg (has no administration in time range)  amLODipine (NORVASC) tablet 5 mg (5 mg Oral Given 03/18/20 1957)  metoprolol tartrate (LOPRESSOR) tablet 12.5 mg (12.5 mg Oral Given 03/18/20 2132)  rosuvastatin (CRESTOR) tablet 40 mg (40 mg Oral Given 03/18/20 1957)  irbesartan (AVAPRO) tablet 75 mg (75 mg Oral Given 03/18/20 1959)  ALPRAZolam (XANAX) tablet 0.5 mg (0.5 mg Oral Given 03/18/20 2132)  escitalopram (LEXAPRO) tablet 20 mg (20 mg Oral Given  03/18/20 1958)  pantoprazole (PROTONIX) EC tablet 40 mg (40 mg Oral Given 03/18/20 1958)  mirabegron ER (MYRBETRIQ) tablet 50 mg (50 mg Oral Given 03/18/20 2132)  clopidogrel (PLAVIX) tablet 75 mg (75 mg Oral Given 03/18/20 1958)  vitamin B-12 (CYANOCOBALAMIN) tablet 1,000 mcg (1,000 mcg Oral Given 03/18/20 1959)  divalproex (DEPAKOTE ER) 24 hr tablet 250 mg (has no administration in time range)  gabapentin (NEURONTIN) capsule 200 mg (has no administration in time range)  tiZANidine (ZANAFLEX) tablet 2 mg (has no administration in time range)  brimonidine (ALPHAGAN) 0.2 % ophthalmic solution 1 drop (has no administration in time range)  dorzolamide (TRUSOPT) 2 % ophthalmic solution 1 drop (has no administration in time range)  latanoprost (XALATAN) 0.005 % ophthalmic solution 1 drop (has no administration in time range)  aspirin EC tablet 81 mg (has no administration in time range)  nitroGLYCERIN (NITROSTAT) SL tablet 0.4 mg (has no administration in time range)  acetaminophen (TYLENOL) tablet 650 mg (has no administration in time range)  ondansetron (ZOFRAN) injection 4 mg (has no administration in time range)  0.9 %  sodium  chloride infusion (has no administration in time range)  sodium chloride flush (NS) 0.9 % injection 3 mL (has no administration in time range)  sodium chloride flush (NS) 0.9 % injection 3 mL (has no administration in time range)  0.9 %  sodium chloride infusion (has no administration in time range)  aspirin chewable tablet 81 mg (has no administration in time range)  0.9% sodium chloride infusion (has no administration in time range)    Followed by  0.9% sodium chloride infusion (has no administration in time range)  levothyroxine (SYNTHROID) tablet 188 mcg (has no administration in time range)  nitroGLYCERIN 50 mg in dextrose 5 % 250 mL (0.2 mg/mL) infusion (5 mcg/min Intravenous Rate/Dose Change 03/18/20 2308)  aspirin tablet 325 mg (325 mg Oral Given 03/18/20 1923)  heparin bolus via infusion 4,000 Units (4,000 Units Intravenous Bolus from Bag 03/18/20 1927)    ED Course  I have reviewed the triage vital signs and the nursing notes.  Pertinent labs & imaging results that were available during my care of the patient were reviewed by me and considered in my medical decision making (see chart for details).  Deanna [name] is a 79 y.o. female presented to the ED for acute shortness of breath.  Reports home oxygen in 80's yesterday but in the ED O2 sats maintained at 95-99% on room air.  Chest xray negative.  ECG shows NSTEMI with Troponins elevated 3720 and 4011.  Electrolytes wnl.   Denies any chest pain but continues to have mid upper back pain. No murmurs, rubs or gallops appreciated on cardiac exam without lower extremity edema.   Will give ASA, Plavix and start Heparin gtt for NSTEMI.   Cardiology consulted, recommends admission and plans for [facility] with possible PCI in am.   11:12 PM    MDM Rules/Calculators/A&P                           Final Clinical Impression(s) / ED Diagnoses Final diagnoses:  NSTEMI (non-ST elevated myocardial infarction) [facility])    Rx / [place] Orders ED  Discharge Orders    None       Carollee Leitz, MD 03/18/20 2035    Carollee Leitz, MD 03/18/20 2310    Carollee Leitz, MD 03/18/20 2318    Drenda Freeze, MD 03/19/20 1515

## 2020-03-18 NOTE — ED Provider Notes (Signed)
Patient placed in Quick Look pathway, seen and evaluated   Chief Complaint: SOB, cough   HPI:   Shortness of breath, cough, unknown fever, chills which started yesterday.  No Covid exposures.  Has been vaccinated.  Also reports chest wall pain and pain to her back and reports soreness.   ROS: Cough, shortness of breath, chills  Physical Exam:   Gen: No distress  Neuro: Awake and Alert  Skin: Warm    Focused Exam: Nontoxic-appearing, slightly winded, speaking in full sentences without increased work of breathing.  Scant lung crackles throughout on exam.  Basic labs, chest x-ray, EKG, troponin ordered  Initiation of care has begun. The patient has been counseled on the process, plan, and necessity for staying for the completion/evaluation, and the remainder of the medical screening examination   Garald Balding, PA-C 03/18/20 1538    Maudie Flakes, MD 03/19/20 [phone]

## 2020-03-18 NOTE — ED Triage Notes (Signed)
To ED for eval of sob, back pain, and cp. Pt states she can't cough up whatever is in her chest. This started 2 days ago. Unknown if she has had a fever. States - "I just find it hard to breath". Pt is fully vaccinated.

## 2020-03-18 NOTE — H&P (Signed)
Cardiology Admission History and Physical:   Patient ID: Deanna [name] MRN: [id number]; DOB: Aug 07, 1941   Admission date: 03/18/2020  Primary Care Provider: Lauree Chandler, NP [facility] Cardiologist: Quay Burow, MD  [facility] Electrophysiologist:  None   Chief Complaint:  Chest pain  Patient Profile:   Deanna [name] is a 79 y.o. female with hx CAD, MI with PTCA to [place], [place] and eventual CABG 1999, has had cath since, PAD with Lt to Rt femfem crossover, also has p Lt subclavian artery stenosis, Lt common carotid to subclavian artery bypass, HTN, HLD, + FH of CAD.      History of Present Illness:   Deanna [name] with above hx and more recently last cath 2013 via Lt common femoral arthery below fem-fem bypass anastomosis  with severe native disease, [place] to LAD atretic with occluded Lt SCA, VG to diag patent, VG-OM patent VG to RCA 100% thrombotic, grumous filled through 1/2 of coursel; Large readiopaque filling defect just prior to occlusion site.-- Not safe for PCI for fear of distal embolization.  Widely patent Left common and external Iliac with brisk flow through the L-R fem-fem bypass graft.      Last PV angio, 2019 with PTA to rt. Limb or femoral femoral bypass graft,  Endovascular aneurysm repair of a pseudoaneurysm off of the femoral femoral bypass graft  2019 with CHF treated, myoview stress was non ischemic and low risk. Echo 11/15/17  With EF 65-70%, G1DD, mild TR.  Now admitted with chest pain, SOB , back pain as well.  She has trouble breathing.    Began yesterday.    + SOB, chest pain, a burning pain.  Her back pain has increased as well.    EKG:  The ECG that was done 03/18/20 with ST at 103, flattened T waves inf. Lat and on later EKGs increase of ST depression was personally reviewed.   Na 139, K+ 4.0 glucose 152, Cr 1.24   Troponin 3,720  Hgb 12.4, Wbc 7.1 pts 172  PCXR NAD  COVID neg  Currently BP 124/86 to 150/111 -still with chest  pain.     Past Medical History:  Diagnosis Date  . AMI 11/17/2008   Qualifier: Diagnosis of  By: Marland Mcalpine    . Anxiety   . Atherosclerotic peripheral vascular disease ([place])   . Bacteremia, escherichia coli 04/27/2015  . Brachial-basilar insufficiency syndrome   . CAD (coronary artery disease)    followed by dr [name].  . Celiac artery stenosis ([place])   . CHF (congestive heart failure) ([place]) 11/14/2017  . Chronic bilateral low back pain without sciatica   . Cognitive impairment   . Colon polyps   . Community acquired pneumonia   . Depression   . Diverticulosis   . Dysuria   . Encephalomalacia   . GAD (generalized anxiety disorder)   . GERD (gastroesophageal reflux disease)   . Glaucoma   . Hearing loss   . Hemorrhoids   . Hypertension   . Hypothyroidism   . Incontinence   . Mixed hyperlipidemia   . Myocardial infarction ([place])   . OSA on CPAP   . Peripheral arterial disease ([place])   . Pneumonitis 04/26/2015  . Pyelonephritis   . Pyelonephritis due to Escherichia coli 04/28/2015  . Sepsis ([place])   . Sinus drainage    took z-pack   finished yesterday  . Spondylosis of lumbar region without myelopathy or radiculopathy   . TBI (traumatic  brain injury) ([place])   . Urticaria   . Vertigo, benign positional     Past Surgical History:  Procedure Laterality Date  . ABDOMINAL AORTOGRAM W/LOWER EXTREMITY N/A 08/22/2017   Procedure: ABDOMINAL AORTOGRAM W/LOWER EXTREMITY;  Surgeon: Serafina Mitchell, MD;  Location: [place] CV LAB;  Service: Cardiovascular;  Laterality: N/A;  . BILATERAL UPPER EXTREMITY ANGIOGRAM N/A 09/11/2012   Procedure: BILATERAL UPPER EXTREMITY ANGIOGRAM;  Surgeon: Serafina Mitchell, MD;  Location: [facility] CATH LAB;  Service: Cardiovascular;  Laterality: N/A;  . carotid duplex doppler Bilateral 09/03/2012, 11/03/2011   Evidence of 40%-59% bilateral internal carotid artery stenosis; however, velocities may be underestimated due to calcific plaque with acoustic  shadowing which makes doppler interrogation difficult. patent left common carotid- subclavian artery bypass with turbulent flow noted at the anastomosis with velocities of 295 cm/s  . CAROTID-SUBCLAVIAN BYPASS GRAFT  12/15/2011   Procedure: BYPASS GRAFT CAROTID-SUBCLAVIAN;  Surgeon: Serafina Mitchell, MD;  Location: [facility] OR;  Service: Vascular;  Laterality: Left;  Left Carotid subclavian bypass  . CORONARY ANGIOPLASTY    . CORONARY ARTERY BYPASS GRAFT    . DOPPLER ECHOCARDIOGRAPHY  05/27/2010, 09/17/2008   Mild Proximal septal thickening is noted. Left ventricular systolic functions is normal ejection fraction =>55%. the aortic valve appears to be mildly sclerotic   . fem-fem bypass graft  1999  . holter monitor  01/21/2008   The predominant rhythm was normal sinus rhythm. Minimum heartrate of 63 bpm at +01:00, maximum heartrate of 105 bpm at + 10:35; and the average heartrate of 75 bpm. Ventricular ectopic activity totaled 1270: Multifocal; 866-PVC's and 404-VEs             . JOINT REPLACEMENT     Left knee  . LEFT HEART CATHETERIZATION WITH CORONARY/GRAFT ANGIOGRAM N/A 12/21/2011   Procedure: LEFT HEART CATHETERIZATION WITH Beatrix Fetters;  Surgeon: Leonie Man, MD;  Location: [facility] CATH LAB;  Service: Cardiovascular;  Laterality: N/A;  . NM MYOCAR PERF EJECTION FRACTION  09/22/2009, 07/03/2007   the post stress myocardial perfusion images show a normal pattern of perfusion is all regions. The post-stress ejection fraction is 68 %. no significant wall motion abnormalities noted. This is a low risk scan.  Marland Kitchen PERIPHERAL VASCULAR INTERVENTION  08/22/2017   Procedure: PERIPHERAL VASCULAR INTERVENTION;  Surgeon: Serafina Mitchell, MD;  Location: [place] CV LAB;  Service: Cardiovascular;;  Fem/Fem Graft  . REPLACEMENT TOTAL KNEE  05-2011  . UNILATERAL UPPER EXTREMEITY ANGIOGRAM Left 11/15/2011   Procedure: UNILATERAL UPPER EXTREMEITY ANGIOGRAM;  Surgeon: Lorretta Harp, MD;  Location[facility] CATH LAB;   Service: Cardiovascular;  Laterality: Left;     Medications Prior to Admission: Prior to Admission medications   Medication Sig Start Date End Date Taking? Authorizing Provider  acetaminophen (TYLENOL) 325 MG tablet 1 by mouth in the morning and 1 by mouth in the pm.    [provider]  ALPRAZolam (XANAX) 0.5 MG tablet Take 1 tablet (0.5 mg total) by mouth daily as needed for anxiety. 02/26/20   Thayer Headings, PMHNP  amLODipine (NORVASC) 5 MG tablet TAKE 1 TABLET EVERY DAY 11/12/19   Lauree Chandler, NP  aspirin EC 81 MG tablet Take 81 mg by mouth at bedtime.     [provider]  brimonidine (ALPHAGAN) 0.2 % ophthalmic solution Place 1 drop into both eyes 2 (two) times daily.    [provider]  cholecalciferol (VITAMIN D) 400 units TABS tablet Take 400 Units by mouth daily.  [provider]  clopidogrel (PLAVIX) 75 MG tablet TAKE 1 TABLET (75 MG TOTAL) BY MOUTH DAILY. 11/25/19   Lorretta Harp, MD  divalproex (DEPAKOTE ER) 250 MG 24 hr tablet TAKE 1 TABLET(250 MG) BY MOUTH TWICE DAILY 02/26/20   Thayer Headings, PMHNP  dorzolamide (TRUSOPT) 2 % ophthalmic solution Place 1 drop into both eyes 2 (two) times daily.    [provider]  escitalopram (LEXAPRO) 20 MG tablet TAKE 1 TABLET(20 MG) BY MOUTH DAILY 02/26/20   Thayer Headings, PMHNP  furosemide (LASIX) 40 MG tablet Take 1 tablet (40 mg total) by mouth daily as needed. Take 1 tablet if you have more than a 2 pound weight gain in 24 hours as needed. 11/16/17 11/30/38  Rama, Venetia Maxon, MD  gabapentin (NEURONTIN) 100 MG capsule Take 1 capsule (100 mg total) by mouth 2 (two) times daily as needed. 08/16/19   Lauree Chandler, NP  latanoprost (XALATAN) 0.005 % ophthalmic solution Place 1 drop into both eyes at bedtime.    [provider]  levothyroxine (SYNTHROID) 100 MCG tablet Take 1 tablet (100 mcg total) by mouth daily before breakfast. To take with 88 mcg tablet to equal 188 mcg total  12/13/19   Lauree Chandler, NP  levothyroxine (SYNTHROID) 88 MCG tablet Take 1 tablet (88 mcg total) by mouth daily before breakfast. To take with 100 mcg tablet to equal 188 mcg total 12/13/19   Lauree Chandler, NP  meclizine (ANTIVERT) 25 MG tablet Take 1 tablet (25 mg total) by mouth as needed for dizziness. 11/30/18   Lauree Chandler, NP  metoprolol tartrate (LOPRESSOR) 25 MG tablet TAKE 1 TABLET TWICE DAILY 11/12/19   Lauree Chandler, NP  mirabegron ER (MYRBETRIQ) 25 MG TB24 tablet Take 25 mg by mouth daily.     [provider]  pantoprazole (PROTONIX) 40 MG tablet TAKE 1 TABLET EVERY DAY 11/25/19   Lauree Chandler, NP  promethazine (PHENERGAN) 12.5 MG tablet Take 12.5 mg by mouth every 6 (six) hours as needed for nausea or vomiting.    [provider]  rosuvastatin (CRESTOR) 40 MG tablet Take 1 tablet (40 mg total) by mouth daily. 08/23/19   Lauree Chandler, NP  tiZANidine (ZANAFLEX) 2 MG tablet Take 1 tablet (2 mg total) by mouth every 6 (six) hours as needed for muscle spasms. 07/01/19   Ngetich, Dinah C, NP  valsartan (DIOVAN) 80 MG tablet TAKE 1 TABLET EVERY MORNING 11/12/19   Lauree Chandler, NP     Allergies:    Allergies  Allergen Reactions  . Donepezil Other (See Comments)    Altered mood, aggression, and caused anger  . Duloxetine Hcl     Increased confusion and memory concerns   . Hydrocodone-Acetaminophen Other (See Comments)    "Delirium, confusion, toxic dementia"  . Meloxicam     Pt suspects that this medicine causes fluid on her lungs.   . Memantine Other (See Comments)    Caused severe aggression  . 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 Vicodin [Hydrocodone-Acetaminophen] Other (See Comments)    Delirium, confusion, and toxic dementia    Social History:   Social History   Socioeconomic History  . Marital status: Divorced    Spouse name: Not on file  . Number of children: Not  on file  . Years of education: Not on file  . Highest education level: Not on file  Occupational History  . Not on file  Tobacco Use  . Smoking status: Former Smoker    Years: 3.00    Types: Cigarettes    Quit date: 11/27/1981    Years since quitting: 38.3  . Smokeless tobacco: Never Used  Vaping Use  . Vaping Use: Never used  Substance and Sexual Activity  . Alcohol use: Not Currently    Alcohol/week: 0.0 standard drinks  . Drug use: No  . Sexual activity: Not Currently    Comment: 1st intercourse 17 yo-1 partner  Other Topics Concern  . Not on file  Social History Narrative   Social History      Diet? Healthy but too many sweets      Do you drink/eat things with caffeine? Yes occasionally      Marital status?                    D                What year were you married?      Do you live in a house, apartment, assisted living, condo, trailer, etc.? condo      Is it one or more stories? 1      How many persons live in your home? 2      Do you have any pets in your home? (please list) 1 cat      Highest level of education completed? 1 year college      Current or past profession: housewife      Do you exercise?           no                           Type & how often?      Advanced Directives      Do you have a living will? no      Do you have a DNR form?             no                     If not, do you want to discuss one? yes      Do you have signed POA/HPOA for forms? no      Functional Status      Do you have difficulty bathing or dressing yourself? No- gets tired      Do you have difficulty preparing food or eating? No- eats frozen entrees or poor nutrition meals      Do you have difficulty managing your medications? yes      Do you have difficulty managing your finances? yes      Do you have difficulty affording your medications? No- funds running low   Social Determinants of Health   Financial Resource Strain:   . Difficulty of Paying Living  Expenses:   Food Insecurity:   . Worried About Charity fundraiser in the Last Year:   . Arboriculturist in the Last Year:   Transportation Needs:   . Film/video editor (Medical):   Marland Kitchen Lack of Transportation (Non-Medical):   Physical Activity:   . Days of Exercise per Week:   . Minutes of Exercise per Session:   Stress:   . Feeling of Stress :   Social Connections:   . Frequency of Communication with Friends and Family:   . Frequency of Social Gatherings  with Friends and Family:   . Attends Religious Services:   . Active Member of Clubs or Organizations:   . Attends Archivist Meetings:   Marland Kitchen Marital Status:   Intimate Partner Violence:   . Fear of Current or Ex-Partner:   . Emotionally Abused:   Marland Kitchen Physically Abused:   . Sexually Abused:     Family History:   The patient's family history includes Anxiety disorder in her daughter and sister; Anxiety disorder (age of onset: 74) in her sister; Cerebral palsy (age of onset: 22) in her sister; Colon cancer (age of onset: 4) in her brother; Congestive Heart Failure (age of onset: 59) in her sister; Dementia in her sister; Depression in her daughter; Diabetes in her father; Heart Problems in her sister; Heart Problems (age of onset: 39) in her sister; Heart attack in her mother; Heart attack (age of onset: 59) in her father; Heart attack (age of onset: 58) in her brother and sister; Heart attack (age of onset: 32) in her sister; Heart disease in her father and mother; Hyperlipidemia in her father; Hypertension in her daughter and father; Hypertension (age of onset: 67) in her sister; Irritable bowel syndrome in her daughter; Prostate cancer (age of onset: 66) in her brother; Stroke in her sister.    ROS:  Please see the history of present illness.  General:no colds or fevers, no weight changes Skin:no rashes or ulcers HEENT:no blurred vision, no congestion CV:see HPI PUL:see HPI GI:no diarrhea constipation or melena, no  indigestion GU:no hematuria, no dysuria MS:no joint pain, no claudication Neuro:no syncope, no lightheadedness Endo:no diabetes, no thyroid disease Sleep apnea with CPAP  All other ROS reviewed and negative.     Physical Exam/Data:   Vitals:   03/18/20 1644 03/18/20 1720 03/18/20 1800 03/18/20 1815  BP: 124/86  130/90 (!) 139/111  Pulse: 95  88 89  Resp: 20  (!) 27 19  Temp:      TempSrc:      SpO2: 90%  96% 100%  Weight:  77.1 kg    Height:  5\' 2"  (1.575 m)     No intake or output data in the 24 hours ending 03/18/20 1843 Last 3 Weights 03/18/2020 12/13/2019 08/16/2019  Weight (lbs) 170 lb 216 lb 210 lb  Weight (kg) 77.111 kg 97.977 kg 95.255 kg  Some encounter information is confidential and restricted. Go to Review Flowsheets activity to see all data.     Body mass index is 31.09 kg/m.  General:  Well nourished, well developed, in no acute distress though still with chest pain  HEENT: normal Lymph: no adenopathy Neck: no JVD Endocrine:  No thryomegaly Vascular: + bil carotid bruits; pedal pulses 1+ bilaterally  Cardiac:  normal S1, S2; RRR; no murmur gallup rub or click Lungs:  clear to auscultation bilaterally diminished in Rt base, no wheezing, rhonchi or rales  Abd: soft, nontender, no hepatomegaly  Ext: no lower ext edema Musculoskeletal:  No deformities, BUE and BLE strength normal and equal Skin: warm and dry  Neuro:  Alert and oriented X 3 MAE follows commands no focal abnormalities noted Psych:  Normal affect    Relevant CV Studies: Echo 11/15/17  Study Conclusions   - Left ventricle: The cavity size was normal. There was mild  concentric hypertrophy. Systolic function was vigorous. The  estimated ejection fraction was in the range of 65% to 70%. Wall  motion was normal; there were no regional wall motion  abnormalities. Doppler parameters  are consistent with abnormal  left ventricular relaxation (grade 1 diastolic dysfunction).  There was no  evidence of elevated ventricular filling pressure by  Doppler parameters.  - Aortic valve: There was no regurgitation.  - Mitral valve: Calcified annulus. Mildly thickened leaflets .  There was no regurgitation.  - Left atrium: The atrium was normal in size.  - Right ventricle: The cavity size was normal. Wall thickness was  normal. Systolic function was normal.  - Right atrium: The atrium was normal in size.  - Tricuspid valve: There was mild regurgitation.  - Pulmonary arteries: Systolic pressure was within the normal  range.  - Inferior vena cava: The vessel was normal in size.  - Pericardium, extracardiac: There was no pericardial effusion.   Laboratory Data:  High Sensitivity Troponin:   Recent Labs  Lab 03/18/20 1537 03/18/20 1737  TROPONINIHS 3,720* 4,011*      Chemistry Recent Labs  Lab 03/18/20 1537  NA 139  K 4.0  CL 100  CO2 25  GLUCOSE 152*  BUN 21  CREATININE 1.24*  CALCIUM 9.2  GFRNONAA 42*  GFRAA 48*  ANIONGAP 14    No results for input(s): PROT, ALBUMIN, AST, ALT, ALKPHOS, BILITOT in the last 168 hours. Hematology Recent Labs  Lab 03/18/20 1537  WBC 7.1  RBC 4.44  HGB 12.4  HCT 39.4  MCV 88.7  MCH 27.9  MCHC 31.5  RDW 13.7  PLT 172   BNPNo results for input(s): BNP, PROBNP in the last 168 hours.  DDimer No results for input(s): DDIMER in the last 168 hours.   Radiology/Studies:  DG Chest Portable 1 View  Result Date: 03/18/2020 CLINICAL DATA:  Shortness of breath EXAM: PORTABLE CHEST 1 VIEW COMPARISON:  11/14/2017 FINDINGS: Prior CABG. Heart is normal size. No confluent opacities, effusions or edema. No acute bony abnormality. IMPRESSION: No active disease. Electronically Signed   By: Rolm Baptise M.D.   On: 03/18/2020 18:06       TIMI Risk Score for Unstable Angina or Non-ST Elevation MI:   The patient's TIMI risk score is 7, which indicates a 41% risk of all cause mortality, new or recurrent myocardial infarction or need for  urgent revascularization in the next 14 days.   New York Heart Association (NYHA) Functional Class NYHA Class II  Assessment and Plan:   1. NSTEMI with elevated troponin - known CAD with hx of MIs and prior CABG in 1999 and known graft closure in VG to RCA and LIMA atretic.  Plan IV heparin, IV NTG and cardiac cath tomorrow with graft eval.    Last cath 2013 with access  via Lt common femoral arthery below fem-fem bypass anastomosis due to her PAD.  Admit to progressive unit 2. CAD with hx of CABG 1999 and known graft closure 2/4.   3. PAD with Lt to Rt femfem crossover, also has p Lt subclavian artery stenosis, Lt common carotid to subclavian artery bypass, and repair of rt femoral pseudoaneurysm.  4. HLD on statin continue.  5. HTN continue meds   Severity of Illness: The appropriate patient status for this patient is INPATIENT. Inpatient status is judged to be reasonable and necessary in order to provide the required intensity of service to ensure the patient's safety. The patient's presenting symptoms, physical exam findings, and initial radiographic and laboratory data in the context of their chronic comorbidities is felt to place them at high risk for further clinical deterioration. Furthermore, it is not anticipated that the patient  will be medically stable for discharge from the hospital within 2 midnights of admission. The following factors support the patient status of inpatient.   " The patient's presenting symptoms include chest pain and SOB along with back pain. " The worrisome physical exam findings include chest pain. " The initial radiographic and laboratory data are worrisome because of elevated troponin + NSTEMI . " The chronic co-morbidities include known CAD, + MI, +CABG, graft closure 2/4, HTN HLD, PAD  .   * I certify that at the point of admission it is my clinical judgment that the patient will require inpatient hospital care spanning beyond 2 midnights from the point of  admission due to high intensity of service, high risk for further deterioration and high frequency of surveillance required.*    For questions or updates, please contact [place] Please consult [email or web address] for contact info under     Signed, Cecilie Kicks, NP  03/18/2020 6:43 PM

## 2020-03-18 NOTE — Telephone Encounter (Signed)
Called daughter and gave her Jessica's recommendations. She stated her mother had a head injury and is like a child and will just wimper and whine like a 5-YO if she has to wait for a long period of time. I suggested if she went to UC to call and see if she could make an appointment and could wait in the car until she could be seen.  Daughter was not sure what she was going to do. I told her we did not currently have a lab person and we did not have x-ray capability.   The daughter stated that her mom did not have COVID. She stated that she and the patient share her e-mail address and that [facility] gets things mixed up. The patient only had 2 COVID vaccinations and the daughter has had 2 COVID vaccinations. The daughter thought she might have had COVID, but was negative. The patient has not tested positive for COVID.

## 2020-03-19 ENCOUNTER — Inpatient Hospital Stay (HOSPITAL_COMMUNITY): Payer: Medicare HMO

## 2020-03-19 ENCOUNTER — Inpatient Hospital Stay (HOSPITAL_COMMUNITY)
Admission: EM | Disposition: A | Discharge status: Skilled Nursing Facility | Payer: Self-pay | Source: Home / Self Care | Attending: Cardiology

## 2020-03-19 DIAGNOSIS — E785 Hyperlipidemia, unspecified: Secondary | ICD-10-CM | POA: Diagnosis not present

## 2020-03-19 DIAGNOSIS — I10 Essential (primary) hypertension: Secondary | ICD-10-CM

## 2020-03-19 DIAGNOSIS — I2510 Atherosclerotic heart disease of native coronary artery without angina pectoris: Secondary | ICD-10-CM

## 2020-03-19 DIAGNOSIS — I340 Nonrheumatic mitral (valve) insufficiency: Secondary | ICD-10-CM

## 2020-03-19 DIAGNOSIS — I214 Non-ST elevation (NSTEMI) myocardial infarction: Secondary | ICD-10-CM | POA: Diagnosis not present

## 2020-03-19 HISTORY — PX: CORONARY ATHERECTOMY: CATH118238

## 2020-03-19 HISTORY — PX: CORONARY STENT INTERVENTION: CATH118234

## 2020-03-19 HISTORY — PX: LEFT HEART CATH AND CORS/GRAFTS ANGIOGRAPHY: CATH118250

## 2020-03-19 LAB — LIPID PANEL
Cholesterol: 90 mg/dL (ref 0–200)
HDL: 26 mg/dL — ABNORMAL LOW (ref >40)
LDL Cholesterol: 30 mg/dL (ref 0–99)
Total CHOL/HDL Ratio: 3.5 RATIO
Triglycerides: 172 mg/dL — ABNORMAL HIGH (ref <150)
VLDL: 34 mg/dL (ref 0–40)

## 2020-03-19 LAB — PROTIME-INR
INR: 1.2 (ref 0.8–1.2)
Prothrombin Time: 14.3 seconds (ref 11.4–15.2)

## 2020-03-19 LAB — ECHOCARDIOGRAM COMPLETE
Area-P 1/2: 4.31 cm2
Height: 62 in
S' Lateral: 3.5 cm
Weight: 3368 oz

## 2020-03-19 LAB — CBC
HCT: 35.5 % — ABNORMAL LOW (ref 36.0–46.0)
Hemoglobin: 11.1 g/dL — ABNORMAL LOW (ref 12.0–15.0)
MCH: 27.2 pg (ref 26.0–34.0)
MCHC: 31.3 g/dL (ref 30.0–36.0)
MCV: 87 fL (ref 80.0–100.0)
Platelets: 148 10*3/uL — ABNORMAL LOW (ref 150–400)
RBC: 4.08 MIL/uL (ref 3.87–5.11)
RDW: 13.6 % (ref 11.5–15.5)
WBC: 6.4 10*3/uL (ref 4.0–10.5)
nRBC: 0 % (ref 0.0–0.2)

## 2020-03-19 LAB — POCT ACTIVATED CLOTTING TIME: Activated Clotting Time: 384 seconds

## 2020-03-19 LAB — BASIC METABOLIC PANEL
Anion gap: 9 (ref 5–15)
BUN: 20 mg/dL (ref 8–23)
CO2: 28 mmol/L (ref 22–32)
Calcium: 9 mg/dL (ref 8.9–10.3)
Chloride: 103 mmol/L (ref 98–111)
Creatinine, Ser: 1.31 mg/dL — ABNORMAL HIGH (ref 0.44–1.00)
GFR calc Af Amer: 45 mL/min — ABNORMAL LOW (ref >60)
GFR calc non Af Amer: 39 mL/min — ABNORMAL LOW (ref >60)
Glucose, Bld: 118 mg/dL — ABNORMAL HIGH (ref 70–99)
Potassium: 3.8 mmol/L (ref 3.5–5.1)
Sodium: 140 mmol/L (ref 135–145)

## 2020-03-19 LAB — HEPARIN LEVEL (UNFRACTIONATED): Heparin Unfractionated: 0.12 IU/mL — ABNORMAL LOW (ref 0.30–0.70)

## 2020-03-19 LAB — TROPONIN I (HIGH SENSITIVITY): Troponin I (High Sensitivity): 3370 ng/L (ref <18)

## 2020-03-19 SURGERY — LEFT HEART CATH AND CORS/GRAFTS ANGIOGRAPHY
Anesthesia: LOCAL

## 2020-03-19 MED ORDER — SODIUM CHLORIDE 0.9 % IV SOLN
INTRAVENOUS | Status: DC | PRN
  Administered 2020-03-19 (×2): 1.75 mg/kg/h via INTRAVENOUS

## 2020-03-19 MED ORDER — SODIUM CHLORIDE 0.9% FLUSH
3.0000 mL | Freq: Two times a day (BID) | INTRAVENOUS | Status: DC
  Administered 2020-03-19 – 2020-04-01 (×21): 3 mL via INTRAVENOUS

## 2020-03-19 MED ORDER — MIDAZOLAM HCL 2 MG/2ML IJ SOLN
INTRAMUSCULAR | Status: AC
  Filled 2020-03-19: qty 2

## 2020-03-19 MED ORDER — BIVALIRUDIN TRIFLUOROACETATE 250 MG IV SOLR
INTRAVENOUS | Status: AC
  Filled 2020-03-19: qty 250

## 2020-03-19 MED ORDER — VIPERSLIDE LUBRICANT OPTIME: TOPICAL | Status: DC | PRN

## 2020-03-19 MED ORDER — LIDOCAINE HCL (PF) 1 % IJ SOLN
INTRAMUSCULAR | Status: AC
  Filled 2020-03-19: qty 30

## 2020-03-19 MED ORDER — MIDAZOLAM HCL 2 MG/2ML IJ SOLN
INTRAMUSCULAR | Status: DC | PRN
  Administered 2020-03-19 (×2): 1 mg via INTRAVENOUS

## 2020-03-19 MED ORDER — ACETAMINOPHEN 325 MG PO TABS: 650.0000 mg | ORAL_TABLET | ORAL | Status: DC | PRN

## 2020-03-19 MED ORDER — LABETALOL HCL 5 MG/ML IV SOLN: 10.0000 mg | INTRAVENOUS | Status: AC | PRN

## 2020-03-19 MED ORDER — FENTANYL CITRATE (PF) 100 MCG/2ML IJ SOLN
INTRAMUSCULAR | Status: DC | PRN
  Administered 2020-03-19 (×6): 25 ug via INTRAVENOUS

## 2020-03-19 MED ORDER — FENTANYL CITRATE (PF) 100 MCG/2ML IJ SOLN
INTRAMUSCULAR | Status: AC
  Filled 2020-03-19: qty 2

## 2020-03-19 MED ORDER — FUROSEMIDE 10 MG/ML IJ SOLN
20.0000 mg | Freq: Once | INTRAMUSCULAR | Status: AC
  Administered 2020-03-19: 20 mg via INTRAVENOUS
  Filled 2020-03-19: qty 2

## 2020-03-19 MED ORDER — ONDANSETRON HCL 4 MG/2ML IJ SOLN: 4.0000 mg | Freq: Four times a day (QID) | INTRAMUSCULAR | Status: DC | PRN

## 2020-03-19 MED ORDER — HYDRALAZINE HCL 20 MG/ML IJ SOLN: 10.0000 mg | INTRAMUSCULAR | Status: AC | PRN

## 2020-03-19 MED ORDER — NITROGLYCERIN 1 MG/10 ML FOR IR/CATH LAB
INTRA_ARTERIAL | Status: AC
  Filled 2020-03-19: qty 10

## 2020-03-19 MED ORDER — BIVALIRUDIN BOLUS VIA INFUSION - CUPID
INTRAVENOUS | Status: DC | PRN
  Administered 2020-03-19: 71.625 mg via INTRAVENOUS

## 2020-03-19 MED ORDER — SODIUM CHLORIDE 0.9% FLUSH
3.0000 mL | INTRAVENOUS | Status: DC | PRN
  Administered 2020-03-26: 3 mL via INTRAVENOUS

## 2020-03-19 MED ORDER — HEPARIN (PORCINE) 25000 UT/250ML-% IV SOLN: 1000.0000 [IU]/h | INTRAVENOUS | Status: DC

## 2020-03-19 MED ORDER — IOHEXOL 350 MG/ML SOLN
INTRAVENOUS | Status: AC
  Filled 2020-03-19: qty 1

## 2020-03-19 MED ORDER — HEPARIN (PORCINE) IN NACL 1000-0.9 UT/500ML-% IV SOLN
INTRAVENOUS | Status: AC
  Filled 2020-03-19: qty 1000

## 2020-03-19 MED ORDER — ASPIRIN 81 MG PO CHEW: 81.0000 mg | CHEWABLE_TABLET | Freq: Every day | ORAL | Status: DC

## 2020-03-19 MED ORDER — PERFLUTREN LIPID MICROSPHERE
1.0000 mL | INTRAVENOUS | Status: AC | PRN
  Administered 2020-03-19: 2 mL via INTRAVENOUS
  Filled 2020-03-19: qty 10

## 2020-03-19 MED ORDER — CLOPIDOGREL BISULFATE 75 MG PO TABS: 75.0000 mg | ORAL_TABLET | Freq: Every day | ORAL | Status: DC

## 2020-03-19 MED ORDER — LIDOCAINE HCL (PF) 1 % IJ SOLN
INTRAMUSCULAR | Status: DC | PRN
  Administered 2020-03-19: 20 mL

## 2020-03-19 MED ORDER — CLOPIDOGREL BISULFATE 75 MG PO TABS
300.0000 mg | ORAL_TABLET | ORAL | Status: AC
  Administered 2020-03-19: 300 mg via ORAL
  Filled 2020-03-19: qty 4

## 2020-03-19 MED ORDER — SODIUM CHLORIDE 0.9 % IV SOLN: 250.0000 mL | INTRAVENOUS | Status: DC | PRN

## 2020-03-19 MED ORDER — IOHEXOL 350 MG/ML SOLN
INTRAVENOUS | Status: DC | PRN
  Administered 2020-03-19: 180 mL via INTRA_ARTERIAL

## 2020-03-19 MED ORDER — ALBUTEROL SULFATE (2.5 MG/3ML) 0.083% IN NEBU
2.5000 mg | INHALATION_SOLUTION | RESPIRATORY_TRACT | Status: DC | PRN
  Administered 2020-03-19: 2.5 mg via RESPIRATORY_TRACT

## 2020-03-19 MED ORDER — HEPARIN (PORCINE) IN NACL 1000-0.9 UT/500ML-% IV SOLN
INTRAVENOUS | Status: DC | PRN
  Administered 2020-03-19 (×2): 500 mL

## 2020-03-19 SURGICAL SUPPLY — 33 items
ADDWIRE .014X145 (WIRE) ×2
BALLN SAPPHIRE 1.0X10 (BALLOONS) ×2
BALLN SAPPHIRE 2.0X12 (BALLOONS) ×2
BALLN SAPPHIRE 3.0X15 (BALLOONS) ×2
BALLN SAPPHIRE ~~LOC~~ 3.75X12 (BALLOONS) ×1 IMPLANT
BALLOON SAPPHIRE 1.0X10 (BALLOONS) IMPLANT
BALLOON SAPPHIRE 2.0X12 (BALLOONS) IMPLANT
BALLOON SAPPHIRE 3.0X15 (BALLOONS) IMPLANT
CATH INFINITI 5FR MULTPACK ANG (CATHETERS) ×1 IMPLANT
CATH LAUNCHER 6FR AL1 (CATHETERS) IMPLANT
CATH LAUNCHER 6FR EBU 3.75 (CATHETERS) ×1 IMPLANT
CATH MAMBA 135 (CATHETERS) ×1 IMPLANT
CATHETER LAUNCHER 6FR AL1 (CATHETERS) ×2
CROWN DIAMONDBACK CLASSIC 1.25 (BURR) ×1 IMPLANT
EXTENSION ADDWIRE .014X145 (WIRE) IMPLANT
HOVERMATT SINGLE USE (MISCELLANEOUS) ×1 IMPLANT
KIT ENCORE 26 ADVANTAGE (KITS) ×1 IMPLANT
KIT HEART LEFT (KITS) ×2 IMPLANT
KIT HEMO VALVE WATCHDOG (MISCELLANEOUS) ×1 IMPLANT
LUBRICANT VIPERSLIDE CORONARY (MISCELLANEOUS) ×1 IMPLANT
PACK CARDIAC CATHETERIZATION (CUSTOM PROCEDURE TRAY) ×2 IMPLANT
SHEATH PINNACLE 5F 10CM (SHEATH) ×1 IMPLANT
SHEATH PINNACLE 6F 10CM (SHEATH) ×1 IMPLANT
SHEATH PROBE COVER 6X72 (BAG) ×1 IMPLANT
STENT RESOLUTE ONYX 3.5X15 (Permanent Stent) ×1 IMPLANT
SYR MEDRAD MARK 7 150ML (SYRINGE) ×2 IMPLANT
TRANSDUCER W/STOPCOCK (MISCELLANEOUS) ×2 IMPLANT
TUBING CIL FLEX 10 FLL-RA (TUBING) ×2 IMPLANT
WIRE ASAHI PROWATER 180CM (WIRE) ×1 IMPLANT
WIRE EMERALD 3MM-J .035X150CM (WIRE) ×1 IMPLANT
WIRE FIGHTER CROSSING 190CM (WIRE) ×2 IMPLANT
WIRE HI TORQ VERSACORE-J 145CM (WIRE) ×1 IMPLANT
WIRE VIPERWIRE COR FLEX .012 (WIRE) ×1 IMPLANT

## 2020-03-19 NOTE — Interval H&P Note (Signed)
Cath Lab Visit (complete for each Cath Lab visit)  Clinical Evaluation Leading to the Procedure:   ACS: Yes.    Non-ACS:    Anginal Classification: CCS IV  Anti-ischemic medical therapy: Minimal Therapy (1 class of medications)  Non-Invasive Test Results: No non-invasive testing performed  Prior CABG: Previous CABG    Left groin due to severe PAD and left subclavian to carotid bypass  History and Physical Interval Note:  03/19/2020 10:43 AM  Deanna [name]  has presented today for surgery, with the diagnosis of NSTEMI.  The various methods of treatment have been discussed with the patient and family. After consideration of risks, benefits and other options for treatment, the patient has consented to  Procedure(s): LEFT HEART CATH AND CORS/GRAFTS ANGIOGRAPHY (N/A) as a surgical intervention.  The patient's history has been reviewed, patient examined, no change in status, stable for surgery.  I have reviewed the patient's chart and labs.  Questions were answered to the patient's satisfaction.     Larae Grooms

## 2020-03-19 NOTE — Progress Notes (Signed)
°  Echocardiogram 2D Echocardiogram has been performed.  Deanna [name] 03/19/2020, 2:57 PM

## 2020-03-19 NOTE — Progress Notes (Signed)
   Notified in sign out that patient had worsening shortness of breath this morning with O2 sats droppiong to 87% on 5L via Nasal Cannula. O2 increased to 10 L and 2 albuterol breathing treatments given. Chest x-ray ordered and one dose of IV Lasix 20mg  ordered. I went to check on patient around 6:40am. Patient still reporting shortness of breath at that time and stated she just felt like she could not catch her breath. She denies any chest pain. O2 stats 95%. Mild expiratory wheezing as well as some faint crackles noted on exam. Heart rates in the low 100's. RN called daughter who reported that anxiety may be playing a role as well.   Chest x-ray came back and showed increasing interstitial pattern compatible with CHF as well as bilateral pleural effusion (L>R) and mild bibasilar opacities that likely reflect atelectasis. BNP elevated in the 900's yesterday. Will go ahead and give another dose of IV Lasix 20mg .   Went back by to check on patient around 7:20am but she was sleeping soundly so I did not wake her up.  Darreld Mclean, PA-C 03/19/2020 7:33 AM

## 2020-03-19 NOTE — Progress Notes (Signed)
[place] for heparin Indication: chest pain/ACS  Allergies  Allergen Reactions   Donepezil Other (See Comments)    Altered mood, aggression, and caused anger   Duloxetine Hcl     Increased confusion and memory concerns    Meloxicam     Pt suspects that this medicine causes fluid on her lungs.    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    Vicodin [Hydrocodone-Acetaminophen] Other (See Comments)    Delirium, confusion, and toxic dementia    Patient Measurements: Height: 5\' 2"  (157.5 cm) Weight: 95.5 kg (210 lb 8 oz) IBW/kg (Calculated) : 50.1 Heparin Dosing Weight: 67 kg  Vital Signs: Temp: 98.4 F (36.9 C) (08/12 0430) Temp Source: Oral (08/12 0430) BP: 113/67 (08/12 0430) Pulse Rate: 86 (08/12 0430)  Labs: Recent Labs    03/18/20 1537 03/18/20 1737 03/18/20 2248 03/19/20 0343  HGB 12.4  --   --  11.1*  HCT 39.4  --   --  35.5*  PLT 172  --   --  148*  LABPROT  --   --   --  14.3  INR  --   --   --  1.2  HEPARINUNFRC  --   --   --  0.12*  CREATININE 1.24*  --   --  1.31*  TROPONINIHS 3,720* 4,011* 3,425*  --     Estimated Creatinine Clearance: 38.2 mL/min (A) (by C-G formula based on SCr of 1.31 mg/dL (H)).   Medical History: Past Medical History:  Diagnosis Date   AMI 11/17/2008   Qualifier: Diagnosis of  By: Marland Mcalpine     Anxiety    Atherosclerotic peripheral vascular disease ([place])    Bacteremia, escherichia coli 04/27/2015   Brachial-basilar insufficiency syndrome    CAD (coronary artery disease)    followed by dr [name].   Celiac artery stenosis (HCC)    CHF (congestive heart failure) ([place]) 11/14/2017   Chronic bilateral low back pain without sciatica    Cognitive impairment    Colon polyps    Community acquired pneumonia    Depression    Diverticulosis    Dysuria     Encephalomalacia    GAD (generalized anxiety disorder)    GERD (gastroesophageal reflux disease)    Glaucoma    Hearing loss    Hemorrhoids    Hypertension    Hypothyroidism    Incontinence    Mixed hyperlipidemia    Myocardial infarction (HCC)    OSA on CPAP    Peripheral arterial disease ([place])    Pneumonitis 04/26/2015   Pyelonephritis    Pyelonephritis due to Escherichia coli 04/28/2015   Sepsis ([place])    Sinus drainage    took z-pack   finished yesterday   Spondylosis of lumbar region without myelopathy or radiculopathy    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[place])    Urticaria    Vertigo, benign positional     Medications:   Scheduled:   amLODipine  5 mg Oral Daily   aspirin  81 mg Oral Pre-Cath   aspirin EC  81 mg Oral Daily   brimonidine  1 drop Both Eyes BID   clopidogrel  75 mg Oral Daily   divalproex  250 mg Oral BID   dorzolamide  1 drop Both Eyes BID   escitalopram  20 mg Oral Daily  irbesartan  75 mg Oral Daily   latanoprost  1 drop Both Eyes QHS   levothyroxine  188 mcg Oral Q0600   metoprolol tartrate  12.5 mg Oral BID   mirabegron ER  50 mg Oral Daily   pantoprazole  40 mg Oral Daily   rosuvastatin  40 mg Oral Daily   sodium chloride flush  3 mL Intravenous Q12H   vitamin B-12  1,000 mcg Oral Daily    Assessment: 79 yo female presents with chest pain and shortness of breath. The patient received 325 mg of aspirin x 1 dose in the emergency department. The patient's high sensitivity troponin was 3,720 upon admission. PTA the patient takes aspirin and clopidogrel but no oral anticoagulation. Pharmacy is now consulted to dose heparin for ACS.  The patient's CBC is WNL and there are no signs or symptoms of bleeding documented.   8/12 AM update:  Sub-therapeutic heparin level  Goal of Therapy:   Heparin level 0.3-0.7 units/ml Monitor platelets by anticoagulation protocol: Yes   Plan:  Inc heparin to 1000  units/hr Re-check heparin level in 8 hours Monitor daily heparin level and CBC Monitor for signs and symptoms of bleeding  Narda Bonds, PharmD, BCPS Clinical Pharmacist Phone: [phone]

## 2020-03-19 NOTE — Progress Notes (Addendum)
Progress Note  Patient Name: Deanna [name] Date of Encounter: 03/19/2020  [facility] Cardiologist: Quay Burow, MD   Subjective   This morning patient became acutely sob requiring 10L O2. She was wheezing so she was given 2 albuterol treatments. CXR showed increasing CHF and B/L pleural effusions, BNP noted to be 900. Patient was given total of IV lasix 40 mg. Patient symptoms improved.   On interview she is feeling much better. O2 decreased from 11 to 9L. Still has diminished breath sounds at bases. Also reported chest pain this morning. Able to lay flat during exam.   Inpatient Medications    Scheduled Meds: . amLODipine  5 mg Oral Daily  . aspirin EC  81 mg Oral Daily  . brimonidine  1 drop Both Eyes BID  . clopidogrel  75 mg Oral Daily  . divalproex  250 mg Oral BID  . dorzolamide  1 drop Both Eyes BID  . escitalopram  20 mg Oral Daily  . furosemide  20 mg Intravenous Once  . irbesartan  75 mg Oral Daily  . latanoprost  1 drop Both Eyes QHS  . levothyroxine  188 mcg Oral Q0600  . metoprolol tartrate  12.5 mg Oral BID  . mirabegron ER  50 mg Oral Daily  . pantoprazole  40 mg Oral Daily  . rosuvastatin  40 mg Oral Daily  . sodium chloride flush  3 mL Intravenous Q12H  . vitamin B-12  1,000 mcg Oral Daily   Continuous Infusions: . sodium chloride    . sodium chloride    . sodium chloride    . heparin 1,000 Units/hr (03/19/20 0618)  . nitroGLYCERIN 15 mcg/min (03/19/20 0549)   PRN Meds: sodium chloride, acetaminophen, acetaminophen, albuterol, ALPRAZolam, gabapentin, nitroGLYCERIN, ondansetron (ZOFRAN) IV, sodium chloride flush, tiZANidine   Vital Signs    Vitals:   03/19/20 0007 03/19/20 0430 03/19/20 0607 03/19/20 0757  BP: (!) 101/55 113/67  (!) 93/49  Pulse: 80 86  89  Resp: 20 18  20   Temp: 98 F (36.7 C) 98.4 F (36.9 C)  98.8 F (37.1 C)  TempSrc: Oral Oral  Oral  SpO2: 92% 93% 92% 96%  Weight:  95.5 kg    Height:       No intake or  output data in the 24 hours ending 03/19/20 0801 Last 3 Weights 03/19/2020 03/18/2020 12/13/2019  Weight (lbs) 210 lb 8 oz 170 lb 216 lb  Weight (kg) 95.482 kg 77.111 kg 97.977 kg  Some encounter information is confidential and restricted. Go to Review Flowsheets activity to see all data.      Telemetry    NSR, HR 90-100, no other arrhythmias noted - Personally Reviewed  ECG    SR, inferior lateral ST/T wave changes, 103 bpm- Personally Reviewed  Physical Exam   GEN: No acute distress.   Neck: No JVD Cardiac: RRR, no murmurs, rubs, or gallops.  Respiratory: Diminished at bases GI: Soft, nontender, non-distended  MS: No edema; No deformity. Neuro:  Nonfocal  Psych: Normal affect   Labs    High Sensitivity Troponin:   Recent Labs  Lab 03/18/20 1537 03/18/20 1737 03/18/20 2248 03/19/20 0502  TROPONINIHS 3,720* 4,011* 3,425* 3,370*      Chemistry Recent Labs  Lab 03/18/20 1537 03/18/20 1918 03/19/20 0343  NA 139  --  140  K 4.0  --  3.8  CL 100  --  103  CO2 25  --  28  GLUCOSE 152*  --  118*  BUN 21  --  20  CREATININE 1.24*  --  1.31*  CALCIUM 9.2  --  9.0  PROT  --  5.9*  --   ALBUMIN  --  3.5  --   AST  --  28  --   ALT  --  9  --   ALKPHOS  --  50  --   BILITOT  --  1.4*  --   GFRNONAA 42*  --  39*  GFRAA 48*  --  45*  ANIONGAP 14  --  9     Hematology Recent Labs  Lab 03/18/20 1537 03/19/20 0343  WBC 7.1 6.4  RBC 4.44 4.08  HGB 12.4 11.1*  HCT 39.4 35.5*  MCV 88.7 87.0  MCH 27.9 27.2  MCHC 31.5 31.3  RDW 13.7 13.6  PLT 172 148*    BNP Recent Labs  Lab 03/18/20 1918  BNP 912.2*     DDimer No results for input(s): DDIMER in the last 168 hours.   Radiology    DG CHEST PORT 1 VIEW  Result Date: 03/19/2020 CLINICAL DATA:  Hypoxia.  Congestive heart failure. EXAM: PORTABLE CHEST 1 VIEW COMPARISON:  One-view chest x-ray 03/18/2020 FINDINGS: Heart size upper limits of normal. CABG is noted. Atherosclerotic changes are present at the  aortic arch. Diffuse interstitial pattern is increasing. Bilateral effusions are present, left greater than right. Mild bibasilar opacities are associated. Advanced degenerative changes are noted in both shoulders, right greater than left. IMPRESSION: 1. Increasing interstitial pattern compatible with congestive heart failure. 2. Bilateral pleural effusions, left greater than right. 3. Mild bibasilar opacities likely reflect atelectasis. Electronically Signed   By: San Morelle M.D.   On: 03/19/2020 07:13   DG Chest Portable 1 View  Result Date: 03/18/2020 CLINICAL DATA:  Shortness of breath EXAM: PORTABLE CHEST 1 VIEW COMPARISON:  11/14/2017 FINDINGS: Prior CABG. Heart is normal size. No confluent opacities, effusions or edema. No acute bony abnormality. IMPRESSION: No active disease. Electronically Signed   By: Rolm Baptise M.D.   On: 03/18/2020 18:06    Cardiac Studies   Echo ordered  Echo 11/2017 Study Conclusions   - Left ventricle: The cavity size was normal. There was mild  concentric hypertrophy. Systolic function was vigorous. The  estimated ejection fraction was in the range of 65% to 70%. Wall  motion was normal; there were no regional wall motion  abnormalities. Doppler parameters are consistent with abnormal  left ventricular relaxation (grade 1 diastolic dysfunction).  There was no evidence of elevated ventricular filling pressure by  Doppler parameters.  - Aortic valve: There was no regurgitation.  - Mitral valve: Calcified annulus. Mildly thickened leaflets .  There was no regurgitation.  - Left atrium: The atrium was normal in size.  - Right ventricle: The cavity size was normal. Wall thickness was  normal. Systolic function was normal.  - Right atrium: The atrium was normal in size.  - Tricuspid valve: There was mild regurgitation.  - Pulmonary arteries: Systolic pressure was within the normal  range.  - Inferior vena cava: The vessel was  normal in size.  - Pericardium, extracardiac: There was no pericardial effusion.   Cardiac cath 2014   Patient Profile     79 y.o. female with pmh of CAD s/p CABG in 1999(LHC 2013: atretic LIMA, patent SVG-OM, occluded SVG-RCA), PDA (Rfem-fem bypass, L subclavian stenosis s/p LSCA to L carotid bypass), HTN, HLD admitted with CP and elevated troponin concerning for NSTEMI.  Assessment & Plan    NSTEMI/CAD s/p CABG and prior stenting - Cath in 12/2011 with occluded SVG-RPDA not amenable to PCI. Last ischemic eval was 11/2017 with nonischemic Myoview stress test - presents with chest and back pain, also with sob - EKG with inferolateral ST/T wave changes - HS troponin elevated 3,720>4,011>3,425>3,370 - IV heparin and IV nitro - Aspirin, plavix - statin and BB - Echo ordered - Plan for cath today, access L femoral.   Acute pulmonary edema/history of Diastolic CHF - PTA Lasix 40 mg daily PRN for 2lbs weight gain.  - Overnight patient became acutely sob, requiring supplemental O2 up to 10L. CXR with worsening CHF and B/L pleural effusions. Given IV lasix 20 mg x 2. Might be anxiety component to symptoms as well.  - patient is feeling much better, on 9L O2. Will continue to try and wean - Able to lay flat with supplemental O2.  - Exam with diminished sounds at bases. Last dose of lasix was 8:24AM. Will hold on giving one at this time however suspect will require more. Creatinine 1.31.   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 Trula Slade after angiography in 11/2011 revealed high-grade calcified prox L subclavian artery stenosis not amenable to PCI - Follows with duplex US  PAD - s/p fem-fem crossover grafting remotely with re-intervention by  Dr. Trula Slade 08/2017 resulting in improvement of ambulation  For questions or updates, please contact [facility] Please consult [email or web address] for contact info under        Signed, Cadence Ninfa Meeker, PA-C  03/19/2020, 8:01 AM    History and all data above reviewed.  Patient examined.  I agree with the findings as above.  Patient now status post cath.  See results below.  Likely late presentation with LAD occlusion and not able to reestablish flow.  Now with new ischemic cardiomyopathy.  She has, per her daughter, also had some failure to thrive recently and general medical decline.  Currently the patient denies chest pain or SOB.  Lying flat  The patient exam reveals COR:RRR  ,  Lungs: Decreased dependent breath sounds  ,  Abd: Positive bowel sounds, no rebound no guarding, Ext No edema, right femoral access site in place  .  All available labs, radiology testing, previous records reviewed. Agree with documented assessment and plan.  Diagnostic Dominance: Right  Intervention  CAD:  Medical management.   Ischemic cardiomyopathy:  Echo pending.  BP currently will not allow med titration.  I will work on this prior to discharge.  Might consider a course of anticoagulation at discharge as her anterior wall is likely to be newly akinetic.    She had two doses of 20 mg IV Lasix this morning.  I am going to stop the Norvasc.  I will continue current beta blocker and ARB as BP tolerates.  She will likely need PO diuretic at home but will hold off on starting now.         Jeneen Rinks Dorothee Napierkowski  2:07 PM  03/19/2020

## 2020-03-19 NOTE — Progress Notes (Signed)
Nurse notified respiratory of patient being short of breath and having some wheezes. Patients Sp02=87% on 5lpm with expiratory wheezes and fine crackles in LLL. Patient given albuterol nebulizer treatment which did improve airflow. After tx patient placed on 10LPM  Salter cannula with Sp02=95%.

## 2020-03-19 NOTE — Progress Notes (Signed)
Site area: left groin  Site Prior to Removal:  Level 0  Pressure Applied For 25 MINUTES    Minutes Beginning at 1515  Manual:   Yes.    Patient Status During Pull:  STABLE   Post Pull Groin Site:  Level 0  Post Pull Instructions Given:  Yes.    Post Pull Pulses Present:  Yes.    Dressing Applied:  Yes.    Comments:  REPORT TO AYABA

## 2020-03-19 NOTE — H&P (View-Only) (Signed)
   Notified in sign out that patient had worsening shortness of breath this morning with O2 sats droppiong to 87% on 5L via Nasal Cannula. O2 increased to 10 L and 2 albuterol breathing treatments given. Chest x-ray ordered and one dose of IV Lasix 20mg  ordered. I went to check on patient around 6:40am. Patient still reporting shortness of breath at that time and stated she just felt like she could not catch her breath. She denies any chest pain. O2 stats 95%. Mild expiratory wheezing as well as some faint crackles noted on exam. Heart rates in the low 100's. RN called daughter who reported that anxiety may be playing a role as well.   Chest x-ray came back and showed increasing interstitial pattern compatible with CHF as well as bilateral pleural effusion (L>R) and mild bibasilar opacities that likely reflect atelectasis. BNP elevated in the 900's yesterday. Will go ahead and give another dose of IV Lasix 20mg .   Went back by to check on patient around 7:20am but she was sleeping soundly so I did not wake her up.  Darreld Mclean, PA-C 03/19/2020 7:33 AM

## 2020-03-20 ENCOUNTER — Encounter (HOSPITAL_COMMUNITY): Payer: Self-pay | Admitting: Interventional Cardiology

## 2020-03-20 DIAGNOSIS — I214 Non-ST elevation (NSTEMI) myocardial infarction: Secondary | ICD-10-CM | POA: Diagnosis not present

## 2020-03-20 LAB — CBC
HCT: 34.9 % — ABNORMAL LOW (ref 36.0–46.0)
Hemoglobin: 11.2 g/dL — ABNORMAL LOW (ref 12.0–15.0)
MCH: 28.4 pg (ref 26.0–34.0)
MCHC: 32.1 g/dL (ref 30.0–36.0)
MCV: 88.4 fL (ref 80.0–100.0)
Platelets: 154 10*3/uL (ref 150–400)
RBC: 3.95 MIL/uL (ref 3.87–5.11)
RDW: 13.5 % (ref 11.5–15.5)
WBC: 8 10*3/uL (ref 4.0–10.5)
nRBC: 0 % (ref 0.0–0.2)

## 2020-03-20 LAB — BASIC METABOLIC PANEL
Anion gap: 12 (ref 5–15)
BUN: 20 mg/dL (ref 8–23)
CO2: 29 mmol/L (ref 22–32)
Calcium: 9 mg/dL (ref 8.9–10.3)
Chloride: 97 mmol/L — ABNORMAL LOW (ref 98–111)
Creatinine, Ser: 1.39 mg/dL — ABNORMAL HIGH (ref 0.44–1.00)
GFR calc Af Amer: 42 mL/min — ABNORMAL LOW (ref >60)
GFR calc non Af Amer: 36 mL/min — ABNORMAL LOW (ref >60)
Glucose, Bld: 144 mg/dL — ABNORMAL HIGH (ref 70–99)
Potassium: 3.7 mmol/L (ref 3.5–5.1)
Sodium: 138 mmol/L (ref 135–145)

## 2020-03-20 MED ORDER — ALPRAZOLAM 0.5 MG PO TABS
0.5000 mg | ORAL_TABLET | Freq: Every evening | ORAL | Status: AC | PRN
  Administered 2020-03-20: 0.5 mg via ORAL
  Filled 2020-03-20: qty 1

## 2020-03-20 MED ORDER — FUROSEMIDE 10 MG/ML IJ SOLN
40.0000 mg | Freq: Once | INTRAMUSCULAR | Status: AC
  Administered 2020-03-20: 40 mg via INTRAVENOUS
  Filled 2020-03-20: qty 4

## 2020-03-20 MED ORDER — AMIODARONE HCL IN DEXTROSE 360-4.14 MG/200ML-% IV SOLN
60.0000 mg/h | INTRAVENOUS | Status: AC
  Administered 2020-03-20: 60 mg/h via INTRAVENOUS
  Filled 2020-03-20 (×2): qty 200

## 2020-03-20 MED ORDER — POTASSIUM CHLORIDE CRYS ER 20 MEQ PO TBCR
20.0000 meq | EXTENDED_RELEASE_TABLET | Freq: Once | ORAL | Status: AC
  Administered 2020-03-20: 20 meq via ORAL
  Filled 2020-03-20: qty 1

## 2020-03-20 MED ORDER — AMIODARONE HCL IN DEXTROSE 360-4.14 MG/200ML-% IV SOLN
30.0000 mg/h | INTRAVENOUS | Status: DC
  Administered 2020-03-20 – 2020-03-21 (×2): 30 mg/h via INTRAVENOUS
  Filled 2020-03-20: qty 200

## 2020-03-20 MED FILL — Nitroglycerin IV Soln 100 MCG/ML in D5W: INTRA_ARTERIAL | Qty: 10 | Status: AC

## 2020-03-20 NOTE — Progress Notes (Signed)
Patient placed on CPAP for the night via auto-mode with oxygen set at 4lpm.

## 2020-03-20 NOTE — Progress Notes (Signed)
CARDIAC REHAB PHASE I   PRE:  Rate/Rhythm: 98 SR PACs  BP:  Supine: 101/51  Sitting:   Standing:    SaO2: 91% 2L  MODE:  Ambulation: 0 ft   POST:  Rate/Rhythm: 98-130s long runs of atrial fib with some sinus beats  BP:  Supine: 93/60,  100/67  Sitting:   Standing:    SaO2: 89-90% 2L 1400-1500 Was going to walk with pt . Was in NSR with PACS. When we sat pt on side of bed she began having longer runs of atrial fib. Denied palpitations. BP and sats stable. Put pt back on bed and got her comfortable. Continued to have atrial fib. Notified cardiology PA. Obtained stat EKG. Cardiology to assess pt. Notified RN of pt's rhythm and EKGs given to her. Patient sleepy but arouses easily.   Graylon Good, RN BSN  03/20/2020 2:59 PM

## 2020-03-20 NOTE — Progress Notes (Addendum)
Notified by cardiac rehab around 2:50pm that patient went into atrial fib with RVR rates 100s-120s prior to activity. EKG ordered which confirms this. Evaluated patient at bedside. She denies any acute changes in status from earlier this AM. Denies CP, SOB or awareness of palpitations. Pulse ox 88-90% at rest (nadir of 86% when she falls asleep and mouth breathes) so bumped up O2 to 3L with O2 sat 90-96%. (Attempts were made to wean earlier today.) On telemetry she does appear to have periods of NSR as well. BP remains low-normal, similar to recent baseline. On exam patient appears comfortable, conversant, heart with irregularly irregular rhythm, no acute murmurs, lung sounds coarse with mild decrease at bases, no overt wheezes, rales or rhonchi. D/w Dr. Percival Spanish who recommends addition of amiodarone drip without bolus. Hold off addition of anticoagulation given acute nature but need to consider if arrhythmia persists into tomorrow.   Addendum 6:30 PM: spoke with nurse who affirms patient has remained clinically stable without acute change in symptoms, hemodynamics appear about the same. Telemetry reviewed, remains in afib HR 90s-105, pulse ox mid 90s presently on supplemental O2, SBP low 100s. Continue amio drip as outlined above. Will also give KCl 76meq given dose of IV Lasix earlier today (CrCl 49). F/u labs ordered for AM.  I also called and spoke with daughter [name] (phone [phone]) to give her update on today's progress. We also had a long conversation about disposition. [name] shared that she has found it increasingly difficult to care for her mother on her own over the last 6 months due to her mother's progressive decline. Her cognitive impairment has had its own challenges. [name] herself was sick a few weeks ago (tested negative for Covid) and that is when she realized it was getting to be too difficult. She had to order [employer] to make sure her mother had something to eat because she was too  ill to keep up. She thinks her mother was eating saltier/richer foods this way at the time. They do not have any surrounding family to help, just the two of them. Suspect the patient would benefit from either offer of rehabilitation services or home health once we reach the point of discharge planning. Will place a care management consult to begin this assistance. Anticipate PT consult to establish need for social work involvement once she is clinically more stable. [name] was very appreciative of call. She plans to come to the hospital tomorrow but welcomes any calls from tomorrow's rounding team with updates or questions if needed.   Deanna Riendeau PA-C

## 2020-03-20 NOTE — Progress Notes (Addendum)
Progress Note  Patient Name: Deanna [name] Date of Encounter: 03/20/2020  Primary Cardiologist: Quay Burow, MD  Subjective   Feeling OK, no complaints, no CP or SOB. No orthopnea. Has not been up OOB yet.  Inpatient Medications    Scheduled Meds: . aspirin EC  81 mg Oral Daily  . brimonidine  1 drop Both Eyes BID  . clopidogrel  75 mg Oral Daily  . divalproex  250 mg Oral BID  . dorzolamide  1 drop Both Eyes BID  . escitalopram  20 mg Oral Daily  . irbesartan  75 mg Oral Daily  . latanoprost  1 drop Both Eyes QHS  . levothyroxine  188 mcg Oral Q0600  . metoprolol tartrate  12.5 mg Oral BID  . mirabegron ER  50 mg Oral Daily  . pantoprazole  40 mg Oral Daily  . rosuvastatin  40 mg Oral Daily  . sodium chloride flush  3 mL Intravenous Q12H  . sodium chloride flush  3 mL Intravenous Q12H  . vitamin B-12  1,000 mcg Oral Daily   Continuous Infusions: . sodium chloride    . sodium chloride    . nitroGLYCERIN 15 mcg/min (03/19/20 0549)   PRN Meds: sodium chloride, acetaminophen, acetaminophen, albuterol, ALPRAZolam, gabapentin, nitroGLYCERIN, ondansetron (ZOFRAN) IV, sodium chloride flush, tiZANidine   Vital Signs    Vitals:   03/19/20 1959 03/19/20 2354 03/20/20 0431 03/20/20 0758  BP: 116/60 121/61 97/66 (!) 92/57  Pulse: 94 99 82 91  Resp: 14 15 13 16   Temp: 98.4 F (36.9 C) 98.5 F (36.9 C) 98.4 F (36.9 C) 98.2 F (36.8 C)  TempSrc: Oral Oral Oral Oral  SpO2: 93% 93% 93% 90%  Weight:   94.9 kg   Height:        Intake/Output Summary (Last 24 hours) at 03/20/2020 0840 Last data filed at 03/19/2020 1930 Gross per 24 hour  Intake 922.95 ml  Output 900 ml  Net 22.95 ml   Last 3 Weights 03/20/2020 03/19/2020 03/18/2020  Weight (lbs) 209 lb 3.5 oz 210 lb 8 oz 170 lb  Weight (kg) 94.9 kg 95.482 kg 77.111 kg  Some encounter information is confidential and restricted. Go to Review Flowsheets activity to see all data.     Telemetry    NSR - Personally  Reviewed  ECG    NSR 87bpm possible LAE, subtle upsloped ST segments V1-V2 with downsloped TWI I, II, III, avF, V4-V6 with subtle ST depression in these leads - Personally Reviewed  Physical Exam   GEN: No acute distress.  HEENT: Normocephalic, atraumatic, sclera non-icteric. Neck: No JVD or bruits. Cardiac: RRR no murmurs, rubs, or gallops.  Radials/DP/PT 1+ and equal bilaterally.  Respiratory: Clear to auscultation bilaterally. Breathing is unlabored. GI: Soft, nontender, non-distended, BS +x 4. MS: no deformity. Extremities: No clubbing or cyanosis. No edema. Distal pedal pulses are 2+ and equal bilaterally. Left groin cath site without hematoma, ecchymosis, or bruit. Neuro:  A+O to self, place, year, not month. Nonfocal otherwise (hx TBI/cognitive impairment). Follows commands.  Psych:  Responds to questions appropriately with a normal affect. Pleasant.  Labs    High Sensitivity Troponin:   Recent Labs  Lab 03/18/20 1537 03/18/20 1737 03/18/20 2248 03/19/20 0502  TROPONINIHS 3,720* 4,011* 3,425* 3,370*      Cardiac EnzymesNo results for input(s): TROPONINI in the last 168 hours. No results for input(s): TROPIPOC in the last 168 hours.   Chemistry Recent Labs  Lab 03/18/20 1537 03/18/20 1918  03/19/20 0343  NA 139  --  140  K 4.0  --  3.8  CL 100  --  103  CO2 25  --  28  GLUCOSE 152*  --  118*  BUN 21  --  20  CREATININE 1.24*  --  1.31*  CALCIUM 9.2  --  9.0  PROT  --  5.9*  --   ALBUMIN  --  3.5  --   AST  --  28  --   ALT  --  9  --   ALKPHOS  --  50  --   BILITOT  --  1.4*  --   GFRNONAA 42*  --  39*  GFRAA 48*  --  45*  ANIONGAP 14  --  9     Hematology Recent Labs  Lab 03/18/20 1537 03/19/20 0343 03/20/20 0356  WBC 7.1 6.4 8.0  RBC 4.44 4.08 3.95  HGB 12.4 11.1* 11.2*  HCT 39.4 35.5* 34.9*  MCV 88.7 87.0 88.4  MCH 27.9 27.2 28.4  MCHC 31.5 31.3 32.1  RDW 13.7 13.6 13.5  PLT 172 148* 154    BNP Recent Labs  Lab 03/18/20 1918  BNP  912.2*     DDimer No results for input(s): DDIMER in the last 168 hours.   Radiology    CARDIAC CATHETERIZATION  Result Date: 03/19/2020  Ost LAD to Prox LAD lesion is 99% stenosed. LIMA to LAD known to be atretic.  After orbital atherectomy, A drug-eluting stent was successfully placed using a STENT RESOLUTE ONYX 3.5X15.  Post intervention, there is a 0% residual stenosis.  Mid LAD lesion is 100% stenosed. Despite atherectomy and angioplasty, flow could not be restored.  Mid Cx lesion is 100% stenosed. SVG to OM patent.  Prox RCA lesion is 100% stenosed. SVG to PDA occluded.  There is moderate left ventricular systolic dysfunction.  LV end diastolic pressure is moderately elevated.  The left ventricular ejection fraction is 25-35% by visual estimate.  There is no aortic valve stenosis.  Balloon angioplasty was performed using a BALLOON SAPPHIRE 2.0X12.  Complex intervention due to severe three-vessel coronary artery disease with prior bypass surgery.  Successful atherectomy and stenting of the proximal LAD.  Unable to restore flow through the mid LAD.  I suspect, she is a somewhat late presenting infarct.  She already has evidence of heart failure with elevated LVEDP.  Will need ongoing medical therapy to help her LV dysfunction.   DG CHEST PORT 1 VIEW  Result Date: 03/19/2020 CLINICAL DATA:  Hypoxia.  Congestive heart failure. EXAM: PORTABLE CHEST 1 VIEW COMPARISON:  One-view chest x-ray 03/18/2020 FINDINGS: Heart size upper limits of normal. CABG is noted. Atherosclerotic changes are present at the aortic arch. Diffuse interstitial pattern is increasing. Bilateral effusions are present, left greater than right. Mild bibasilar opacities are associated. Advanced degenerative changes are noted in both shoulders, right greater than left. IMPRESSION: 1. Increasing interstitial pattern compatible with congestive heart failure. 2. Bilateral pleural effusions, left greater than right. 3. Mild  bibasilar opacities likely reflect atelectasis. Electronically Signed   By: San Morelle M.D.   On: 03/19/2020 07:13   DG Chest Portable 1 View  Result Date: 03/18/2020 CLINICAL DATA:  Shortness of breath EXAM: PORTABLE CHEST 1 VIEW COMPARISON:  11/14/2017 FINDINGS: Prior CABG. Heart is normal size. No confluent opacities, effusions or edema. No acute bony abnormality. IMPRESSION: No active disease. Electronically Signed   By: Rolm Baptise M.D.   On: 03/18/2020 18:06  ECHOCARDIOGRAM COMPLETE  Result Date: 03/19/2020    ECHOCARDIOGRAM REPORT   Patient Name:   Deanna [name] Date of Exam: 03/19/2020 Medical Rec #:  [id number]       Height:       62.0 in Accession #:    [id number]      Weight:       210.5 lb Date of Birth:  Jan 24, 1941       BSA:          1.954 m Patient Age:    16 years        BP:           91/47 mmHg Patient Gender: F               HR:           89 bpm. Exam Location:  Inpatient Procedure: 2D Echo, Cardiac Doppler, Color Doppler and Intracardiac            Opacification Agent Indications:    NSTEMI I21.4  History:        Patient has no prior history of Echocardiogram examinations.                 CHF, Previous Myocardial Infarction and CAD; Risk                 Factors:Hypertension, Dyslipidemia and Sleep Apnea. Sepsis.                 Hypothyroidism. GERD.  Sonographer:    Jonelle Sidle Dance Referring Phys: [place]  1. Left ventricular ejection fraction, by estimation, is 35 to 40%. The left ventricle has moderately decreased function. The left ventricle demonstrates regional wall motion abnormalities (see scoring diagram/findings for description). Left ventricular  diastolic parameters are consistent with Grade II diastolic dysfunction (pseudonormalization). There is severe akinesis of the left ventricular, mid-apical anteroseptal wall.  2. Right ventricular systolic function is mildly reduced. The right ventricular size is normal.  3. The mitral valve is grossly  normal. Mild mitral valve regurgitation.  4. The aortic valve is normal in structure. Aortic valve regurgitation is not visualized. No aortic stenosis is present. FINDINGS  Left Ventricle: Left ventricular ejection fraction, by estimation, is 35 to 40%. The left ventricle has moderately decreased function. The left ventricle demonstrates regional wall motion abnormalities. Severe akinesis of the left ventricular, mid-apical anteroseptal wall. Definity contrast agent was given IV to delineate the left ventricular endocardial borders. The left ventricular internal cavity size was normal in size. There is no left ventricular hypertrophy. Left ventricular diastolic parameters are consistent with Grade II diastolic dysfunction (pseudonormalization). Right Ventricle: The right ventricular size is normal. No increase in right ventricular wall thickness. Right ventricular systolic function is mildly reduced. Left Atrium: Left atrial size was normal in size. Right Atrium: Right atrial size was normal in size. Pericardium: There is no evidence of pericardial effusion. Mitral Valve: The mitral valve is grossly normal. Mild mitral valve regurgitation. Tricuspid Valve: The tricuspid valve is normal in structure. Tricuspid valve regurgitation is trivial. Aortic Valve: The aortic valve is normal in structure. Aortic valve regurgitation is not visualized. No aortic stenosis is present. Pulmonic Valve: The pulmonic valve was normal in structure. Pulmonic valve regurgitation is not visualized. Aorta: The aortic root and ascending aorta are structurally normal, with no evidence of dilitation. IAS/Shunts: The atrial septum is grossly normal.  LEFT VENTRICLE PLAX 2D LVIDd:         4.70 cm  Diastology LVIDs:         3.50 cm  LV e' lateral:   6.20 cm/s LV PW:         1.10 cm  LV E/e' lateral: 20.8 LV IVS:        0.90 cm  LV e' medial:    5.64 cm/s LVOT diam:     1.70 cm  LV E/e' medial:  22.9 LV SV:         31 LV SV Index:   16 LVOT Area:      2.27 cm  RIGHT VENTRICLE            IVC RV Basal diam:  2.60 cm    IVC diam: 1.90 cm RV S prime:     8.08 cm/s TAPSE (M-mode): 1.5 cm LEFT ATRIUM             Index       RIGHT ATRIUM           Index LA diam:        4.50 cm 2.30 cm/m  RA Area:     11.20 cm LA Vol (A2C):   32.4 ml 16.58 ml/m RA Volume:   21.10 ml  10.80 ml/m LA Vol (A4C):   52.8 ml 27.03 ml/m LA Biplane Vol: 41.2 ml 21.09 ml/m  AORTIC VALVE LVOT Vmax:   71.60 cm/s LVOT Vmean:  51.300 cm/s LVOT VTI:    0.136 m  AORTA Ao Root diam: 3.10 cm Ao Asc diam:  3.10 cm MITRAL VALVE MV Area (PHT): 4.31 cm     SHUNTS MV Decel Time: 176 msec     Systemic VTI:  0.14 m MV E velocity: 129.00 cm/s  Systemic Diam: 1.70 cm MV A velocity: 100.00 cm/s MV E/A ratio:  1.29 Mertie Moores MD Electronically signed by Mertie Moores MD Signature Date/Time: 03/19/2020/3:48:49 PM    Final     Cardiac Studies   LHC 03/19/20  Ost LAD to Prox LAD lesion is 99% stenosed. LIMA to LAD known to be atretic.  After orbital atherectomy, A drug-eluting stent was successfully placed using a STENT RESOLUTE ONYX 3.5X15.  Post intervention, there is a 0% residual stenosis.  Mid LAD lesion is 100% stenosed. Despite atherectomy and angioplasty, flow could not be restored.  Mid Cx lesion is 100% stenosed. SVG to OM patent.  Prox RCA lesion is 100% stenosed. SVG to PDA occluded.  There is moderate left ventricular systolic dysfunction.  LV end diastolic pressure is moderately elevated.  The left ventricular ejection fraction is 25-35% by visual estimate.  There is no aortic valve stenosis.  Balloon angioplasty was performed using a BALLOON SAPPHIRE 2.0X12.   Complex intervention due to severe three-vessel coronary artery disease with prior bypass surgery.  Successful atherectomy and stenting of the proximal LAD.  Unable to restore flow through the mid LAD.  I suspect, she is a somewhat late presenting infarct.  She already has evidence of heart failure with  elevated LVEDP.  Will need ongoing medical therapy to help her LV dysfunction.   Patient Profile     79 y.o. female with CAD (MI with PTCA to LAD 1994, PTCA to LCx 1995, eventual CABG 1999 with grown graft closure 2/4), PAD with L-R femfem crossover, also L subclavian artery stenosis with L common carotid to subclavian artery bypass, celiac artery stenosis, cognitive impairment, TBI, depression, anxiety, hearing loss, GERD, HTN, hypothyroidism, HLD, OSA on CPAP. She presented to the ED 03/18/2020 with chest pain,  SOB, and back pain that began the day prior. Labs were consistent with NSTEMI and then cath showed disease as outlined above (atherectomy and stenting of prox LAD with inability to restore flow through mid LAD) - suspected late-presenting infarct with evidence of heart failure and LVEF 25-35%. F/u echo 03/19/20 showed EF 35-40%, grade 2 DD with severe akinesis of the left ventricular, mid-apical anteroseptal wall, mildy reduced RV function, mild MR. Admission also notable for hypoxia and wheezing with CXR showing increasing CHF and bilateral effusions, requiring IV Lasix and supplemental O2.  Assessment & Plan    1. CAD with NSTEMI/suspected late presenting MI - s/p complex intervention with successful atherectomy and stenting of the proximal LAD; unable to restore flow through the mid LAD - uninterrupted DAPT x 12 months (was on this prior to admission as well) - continue statin, otherwise meds as below  2. Acute hypoxic respiratory failure suspected due to acute combined CHF with new LV dysfunction - still with borderline O2 sats of 90% on nasal cannula with occasional desats and soft BP - on low dose ARB and Lopressor   3. Essential HTN, now with hypotension - amlodipine now discontinued with LV dysfunction - managed in context of the above - currently BP 97 systolic.  4. Suspected CKD stage III - most recent OP Cr 1.25 - value was 1.3 pre cath - need to obtain BMET this AM  5.  Mild normocytic anemia  - post-cath value similar to pre-cath value, no bleeding reported  6,  PAD with Lt to Rt femfem crossover, also has p Lt subclavian artery stenosis, Lt common carotid to subclavian artery bypass, and repair of rt femoral pseudoaneurysm.   Cecilie Kicks, FNP-C [facility] [place]  [id number]:[phone] or after 5pm and on weekends call [phone] 03/21/2020.

## 2020-03-21 ENCOUNTER — Inpatient Hospital Stay: Payer: Self-pay

## 2020-03-21 ENCOUNTER — Inpatient Hospital Stay (HOSPITAL_COMMUNITY): Payer: Medicare HMO

## 2020-03-21 DIAGNOSIS — I214 Non-ST elevation (NSTEMI) myocardial infarction: Secondary | ICD-10-CM | POA: Diagnosis not present

## 2020-03-21 LAB — COOXEMETRY PANEL
Carboxyhemoglobin: 1.8 % — ABNORMAL HIGH (ref 0.5–1.5)
Carboxyhemoglobin: 1.5 % (ref 0.5–1.5)
Methemoglobin: 1.1 % (ref 0.0–1.5)
Methemoglobin: 0.9 % (ref 0.0–1.5)
O2 Saturation: 88.5 %
O2 Saturation: 67.7 %
Total hemoglobin: 11.4 g/dL — ABNORMAL LOW (ref 12.0–16.0)
Total hemoglobin: 13 g/dL (ref 12.0–16.0)

## 2020-03-21 LAB — CBC
HCT: 35.4 % — ABNORMAL LOW (ref 36.0–46.0)
HCT: 33.3 % — ABNORMAL LOW (ref 36.0–46.0)
Hemoglobin: 11.2 g/dL — ABNORMAL LOW (ref 12.0–15.0)
Hemoglobin: 10.7 g/dL — ABNORMAL LOW (ref 12.0–15.0)
MCH: 27.6 pg (ref 26.0–34.0)
MCH: 27.7 pg (ref 26.0–34.0)
MCHC: 31.6 g/dL (ref 30.0–36.0)
MCHC: 32.1 g/dL (ref 30.0–36.0)
MCV: 87.2 fL (ref 80.0–100.0)
MCV: 86.3 fL (ref 80.0–100.0)
Platelets: 142 10*3/uL — ABNORMAL LOW (ref 150–400)
Platelets: 138 10*3/uL — ABNORMAL LOW (ref 150–400)
RBC: 4.06 MIL/uL (ref 3.87–5.11)
RBC: 3.86 MIL/uL — ABNORMAL LOW (ref 3.87–5.11)
RDW: 13.6 % (ref 11.5–15.5)
RDW: 13.4 % (ref 11.5–15.5)
WBC: 8.9 10*3/uL (ref 4.0–10.5)
WBC: 7.7 10*3/uL (ref 4.0–10.5)
nRBC: 0 % (ref 0.0–0.2)
nRBC: 0 % (ref 0.0–0.2)

## 2020-03-21 LAB — BASIC METABOLIC PANEL
Anion gap: 14 (ref 5–15)
BUN: 23 mg/dL (ref 8–23)
CO2: 29 mmol/L (ref 22–32)
Calcium: 8.9 mg/dL (ref 8.9–10.3)
Chloride: 95 mmol/L — ABNORMAL LOW (ref 98–111)
Creatinine, Ser: 1.81 mg/dL — ABNORMAL HIGH (ref 0.44–1.00)
GFR calc Af Amer: 30 mL/min — ABNORMAL LOW (ref >60)
GFR calc non Af Amer: 26 mL/min — ABNORMAL LOW (ref >60)
Glucose, Bld: 149 mg/dL — ABNORMAL HIGH (ref 70–99)
Potassium: 3.7 mmol/L (ref 3.5–5.1)
Sodium: 138 mmol/L (ref 135–145)

## 2020-03-21 LAB — BRAIN NATRIURETIC PEPTIDE: B Natriuretic Peptide: 656.6 pg/mL — ABNORMAL HIGH (ref 0.0–100.0)

## 2020-03-21 MED ORDER — DOPAMINE-DEXTROSE 3.2-5 MG/ML-% IV SOLN: 0.0000 ug/kg/min | INTRAVENOUS | Status: DC

## 2020-03-21 MED ORDER — SODIUM CHLORIDE 0.9 % IV SOLN: 250.0000 mL | INTRAVENOUS | Status: DC

## 2020-03-21 MED ORDER — DOPAMINE-DEXTROSE 3.2-5 MG/ML-% IV SOLN
INTRAVENOUS | Status: AC
  Administered 2020-03-21: 5 ug/kg/min via INTRAVENOUS
  Filled 2020-03-21: qty 250

## 2020-03-21 MED ORDER — NOREPINEPHRINE 4 MG/250ML-% IV SOLN
2.0000 ug/min | INTRAVENOUS | Status: DC
  Administered 2020-03-21: 5 ug/min via INTRAVENOUS
  Administered 2020-03-22: 9 ug/min via INTRAVENOUS
  Filled 2020-03-21 (×2): qty 250

## 2020-03-21 MED ORDER — CHLORHEXIDINE GLUCONATE CLOTH 2 % EX PADS
6.0000 | MEDICATED_PAD | Freq: Every day | CUTANEOUS | Status: DC
  Administered 2020-03-22 – 2020-03-27 (×5): 6 via TOPICAL

## 2020-03-21 NOTE — Progress Notes (Addendum)
Progress Note  Patient Name: Deanna [name] Date of Encounter: 03/21/2020  [facility] Cardiologist: Quay Burow, MD   Subjective   Sitting up in chair no chest pain or back pain.  No SOB.  Inpatient Medications    Scheduled Meds: . aspirin EC  81 mg Oral Daily  . brimonidine  1 drop Both Eyes BID  . clopidogrel  75 mg Oral Daily  . divalproex  250 mg Oral BID  . dorzolamide  1 drop Both Eyes BID  . escitalopram  20 mg Oral Daily  . latanoprost  1 drop Both Eyes QHS  . levothyroxine  188 mcg Oral Q0600  . mirabegron ER  50 mg Oral Daily  . pantoprazole  40 mg Oral Daily  . rosuvastatin  40 mg Oral Daily  . sodium chloride flush  3 mL Intravenous Q12H  . sodium chloride flush  3 mL Intravenous Q12H  . vitamin B-12  1,000 mcg Oral Daily   Continuous Infusions: . sodium chloride    . sodium chloride    . amiodarone 30 mg/hr (03/20/20 2149)  . nitroGLYCERIN 15 mcg/min (03/19/20 0549)   PRN Meds: sodium chloride, acetaminophen, acetaminophen, albuterol, ALPRAZolam, gabapentin, nitroGLYCERIN, ondansetron (ZOFRAN) IV, sodium chloride flush, tiZANidine   Vital Signs    Vitals:   03/20/20 2238 03/21/20 0038 03/21/20 0500 03/21/20 0800  BP:  (!) 90/57 (!) 107/56 (!) 106/59  Pulse: 88 85 83   Resp: 18 18 18    Temp:  98.3 F (36.8 C) 99.3 F (37.4 C)   TempSrc:  Oral Oral   SpO2: 95% 96% 94%   Weight:   94.1 kg   Height:        Intake/Output Summary (Last 24 hours) at 03/21/2020 1018 Last data filed at 03/21/2020 0700 Gross per 24 hour  Intake 938.33 ml  Output 1000 ml  Net -61.67 ml   Last 3 Weights 03/21/2020 03/20/2020 03/19/2020  Weight (lbs) 207 lb 7.3 oz 209 lb 3.5 oz 210 lb 8 oz  Weight (kg) 94.1 kg 94.9 kg 95.482 kg  Some encounter information is confidential and restricted. Go to Review Flowsheets activity to see all data.      Telemetry    A fib now in SR since 8p last pm - Personally Reviewed  ECG    SR with no acute ST changes.  Qtc  slightly prolonged  - Personally Reviewed  Physical Exam   GEN: No acute distress.   Neck: No JVD sitting up in chair Cardiac: RRR, no murmurs, rubs, or gallops.  Respiratory: Clear to auscultation bilaterally. GI: Soft, nontender, non-distended  MS: No edema; No deformity. Neuro:  Nonfocal  Psych: Normal affect   Labs    High Sensitivity Troponin:   Recent Labs  Lab 03/18/20 1537 03/18/20 1737 03/18/20 2248 03/19/20 0502  TROPONINIHS 3,720* 4,011* 3,425* 3,370*      Chemistry Recent Labs  Lab 03/18/20 1537 03/18/20 1918 03/19/20 0343 03/20/20 0852 03/21/20 0345  NA   < >  --  140 138 138  K   < >  --  3.8 3.7 3.7  CL   < >  --  103 97* 95*  CO2   < >  --  28 29 29   GLUCOSE   < >  --  118* 144* 149*  BUN   < >  --  20 20 23   CREATININE   < >  --  1.31* 1.39* 1.81*  CALCIUM   < >  --  9.0 9.0 8.9  PROT  --  5.9*  --   --   --   ALBUMIN  --  3.5  --   --   --   AST  --  28  --   --   --   ALT  --  9  --   --   --   ALKPHOS  --  50  --   --   --   BILITOT  --  1.4*  --   --   --   GFRNONAA   < >  --  39* 36* 26*  GFRAA   < >  --  45* 42* 30*  ANIONGAP   < >  --  9 12 14    < > = values in this interval not displayed.     Hematology Recent Labs  Lab 03/19/20 0343 03/20/20 0356 03/21/20 0345  WBC 6.4 8.0 8.9  RBC 4.08 3.95 4.06  HGB 11.1* 11.2* 11.2*  HCT 35.5* 34.9* 35.4*  MCV 87.0 88.4 87.2  MCH 27.2 28.4 27.6  MCHC 31.3 32.1 31.6  RDW 13.6 13.5 13.6  PLT 148* 154 142*    BNP Recent Labs  Lab 03/18/20 1918 03/21/20 0345  BNP 912.2* 656.6*     DDimer No results for input(s): DDIMER in the last 168 hours.   Radiology    CARDIAC CATHETERIZATION  Result Date: 03/19/2020  Ost LAD to Prox LAD lesion is 99% stenosed. LIMA to LAD known to be atretic.  After orbital atherectomy, A drug-eluting stent was successfully placed using a STENT RESOLUTE ONYX 3.5X15.  Post intervention, there is a 0% residual stenosis.  Mid LAD lesion is 100%  stenosed. Despite atherectomy and angioplasty, flow could not be restored.  Mid Cx lesion is 100% stenosed. SVG to OM patent.  Prox RCA lesion is 100% stenosed. SVG to PDA occluded.  There is moderate left ventricular systolic dysfunction.  LV end diastolic pressure is moderately elevated.  The left ventricular ejection fraction is 25-35% by visual estimate.  There is no aortic valve stenosis.  Balloon angioplasty was performed using a BALLOON SAPPHIRE 2.0X12.  Complex intervention due to severe three-vessel coronary artery disease with prior bypass surgery.  Successful atherectomy and stenting of the proximal LAD.  Unable to restore flow through the mid LAD.  I suspect, she is a somewhat late presenting infarct.  She already has evidence of heart failure with elevated LVEDP.  Will need ongoing medical therapy to help her LV dysfunction.   ECHOCARDIOGRAM COMPLETE  Result Date: 03/19/2020    ECHOCARDIOGRAM REPORT   Patient Name:   Deanna [name] Date of Exam: 03/19/2020 Medical Rec #:  [id number]       Height:       62.0 in Accession #:    [id number]      Weight:       210.5 lb Date of Birth:  Sep 27, 1940       BSA:          1.954 m Patient Age:    79 years        BP:           91/47 mmHg Patient Gender: F               HR:           89 bpm. Exam Location:  Inpatient Procedure: 2D Echo, Cardiac Doppler, Color Doppler and Intracardiac  Opacification Agent Indications:    NSTEMI I21.4  History:        Patient has no prior history of Echocardiogram examinations.                 CHF, Previous Myocardial Infarction and CAD; Risk                 Factors:Hypertension, Dyslipidemia and Sleep Apnea. Sepsis.                 Hypothyroidism. GERD.  Sonographer:    Jonelle Sidle Dance Referring Phys: [place]  1. Left ventricular ejection fraction, by estimation, is 35 to 40%. The left ventricle has moderately decreased function. The left ventricle demonstrates regional wall motion abnormalities  (see scoring diagram/findings for description). Left ventricular  diastolic parameters are consistent with Grade II diastolic dysfunction (pseudonormalization). There is severe akinesis of the left ventricular, mid-apical anteroseptal wall.  2. Right ventricular systolic function is mildly reduced. The right ventricular size is normal.  3. The mitral valve is grossly normal. Mild mitral valve regurgitation.  4. The aortic valve is normal in structure. Aortic valve regurgitation is not visualized. No aortic stenosis is present. FINDINGS  Left Ventricle: Left ventricular ejection fraction, by estimation, is 35 to 40%. The left ventricle has moderately decreased function. The left ventricle demonstrates regional wall motion abnormalities. Severe akinesis of the left ventricular, mid-apical anteroseptal wall. Definity contrast agent was given IV to delineate the left ventricular endocardial borders. The left ventricular internal cavity size was normal in size. There is no left ventricular hypertrophy. Left ventricular diastolic parameters are consistent with Grade II diastolic dysfunction (pseudonormalization). Right Ventricle: The right ventricular size is normal. No increase in right ventricular wall thickness. Right ventricular systolic function is mildly reduced. Left Atrium: Left atrial size was normal in size. Right Atrium: Right atrial size was normal in size. Pericardium: There is no evidence of pericardial effusion. Mitral Valve: The mitral valve is grossly normal. Mild mitral valve regurgitation. Tricuspid Valve: The tricuspid valve is normal in structure. Tricuspid valve regurgitation is trivial. Aortic Valve: The aortic valve is normal in structure. Aortic valve regurgitation is not visualized. No aortic stenosis is present. Pulmonic Valve: The pulmonic valve was normal in structure. Pulmonic valve regurgitation is not visualized. Aorta: The aortic root and ascending aorta are structurally normal, with no  evidence of dilitation. IAS/Shunts: The atrial septum is grossly normal.  LEFT VENTRICLE PLAX 2D LVIDd:         4.70 cm  Diastology LVIDs:         3.50 cm  LV e' lateral:   6.20 cm/s LV PW:         1.10 cm  LV E/e' lateral: 20.8 LV IVS:        0.90 cm  LV e' medial:    5.64 cm/s LVOT diam:     1.70 cm  LV E/e' medial:  22.9 LV SV:         31 LV SV Index:   16 LVOT Area:     2.27 cm  RIGHT VENTRICLE            IVC RV Basal diam:  2.60 cm    IVC diam: 1.90 cm RV S prime:     8.08 cm/s TAPSE (M-mode): 1.5 cm LEFT ATRIUM             Index       RIGHT ATRIUM  Index LA diam:        4.50 cm 2.30 cm/m  RA Area:     11.20 cm LA Vol (A2C):   32.4 ml 16.58 ml/m RA Volume:   21.10 ml  10.80 ml/m LA Vol (A4C):   52.8 ml 27.03 ml/m LA Biplane Vol: 41.2 ml 21.09 ml/m  AORTIC VALVE LVOT Vmax:   71.60 cm/s LVOT Vmean:  51.300 cm/s LVOT VTI:    0.136 m  AORTA Ao Root diam: 3.10 cm Ao Asc diam:  3.10 cm MITRAL VALVE MV Area (PHT): 4.31 cm     SHUNTS MV Decel Time: 176 msec     Systemic VTI:  0.14 m MV E velocity: 129.00 cm/s  Systemic Diam: 1.70 cm MV A velocity: 100.00 cm/s MV E/A ratio:  1.29 Mertie Moores MD Electronically signed by Mertie Moores MD Signature Date/Time: 03/19/2020/3:48:49 PM    Final     Cardiac Studies   LHC 03/19/20  Ost LAD to Prox LAD lesion is 99% stenosed. LIMA to LAD known to be atretic.  After orbital atherectomy, A drug-eluting stent was successfully placed using a STENT RESOLUTE ONYX 3.5X15.  Post intervention, there is a 0% residual stenosis.  Mid LAD lesion is 100% stenosed. Despite atherectomy and angioplasty, flow could not be restored.  Mid Cx lesion is 100% stenosed. SVG to OM patent.  Prox RCA lesion is 100% stenosed. SVG to PDA occluded.  There is moderate left ventricular systolic dysfunction.  LV end diastolic pressure is moderately elevated.  The left ventricular ejection fraction is 25-35% by visual estimate.  There is no aortic valve  stenosis.  Balloon angioplasty was performed using a BALLOON SAPPHIRE 2.0X12.  Complex intervention due to severe three-vessel coronary artery disease with prior bypass surgery. Successful atherectomy and stenting of the proximal LAD. Unable to restore flow through the mid LAD. I suspect, she is a somewhat late presenting infarct. She already has evidence of heart failure with elevated LVEDP. Will need ongoing medical therapy to help her LV dysfunction.  Echo 03/19/20   1. Left ventricular ejection fraction, by estimation, is 35 to 40%. The  left ventricle has moderately decreased function. The left ventricle  demonstrates regional wall motion abnormalities (see scoring  diagram/findings for description). Left ventricular  diastolic parameters are consistent with Grade II diastolic dysfunction  (pseudonormalization). There is severe akinesis of the left ventricular,  mid-apical anteroseptal wall.  2. Right ventricular systolic function is mildly reduced. The right  ventricular size is normal.  3. The mitral valve is grossly normal. Mild mitral valve regurgitation.  4. The aortic valve is normal in structure. Aortic valve regurgitation is  not visualized. No aortic stenosis is present.   FINDINGS  Left Ventricle: Left ventricular ejection fraction, by estimation, is 35  to 40%. The left ventricle has moderately decreased function. The left  ventricle demonstrates regional wall motion abnormalities. Severe akinesis  of the left ventricular,  mid-apical anteroseptal wall. Definity contrast agent was given IV to  delineate the left ventricular endocardial borders. The left ventricular  internal cavity size was normal in size. There is no left ventricular  hypertrophy. Left ventricular diastolic  parameters are consistent with Grade II diastolic dysfunction  (pseudonormalization).   Right Ventricle: The right ventricular size is normal. No increase in  right ventricular wall  thickness. Right ventricular systolic function is  mildly reduced.   Left Atrium: Left atrial size was normal in size.   Right Atrium: Right atrial size was normal in  size.   Pericardium: There is no evidence of pericardial effusion.   Mitral Valve: The mitral valve is grossly normal. Mild mitral valve  regurgitation.   Tricuspid Valve: The tricuspid valve is normal in structure. Tricuspid  valve regurgitation is trivial.   Aortic Valve: The aortic valve is normal in structure. Aortic valve  regurgitation is not visualized. No aortic stenosis is present.   Pulmonic Valve: The pulmonic valve was normal in structure. Pulmonic valve  regurgitation is not visualized.   Aorta: The aortic root and ascending aorta are structurally normal, with  no evidence of dilitation.   IAS/Shunts: The atrial septum is grossly normal.  Patient Profile     79 y.o. 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 She presented to the ED 03/18/2020 with chest pain, SOB, and back pain that began the day prior. Labs were consistent with NSTEMI and then cath showed disease as outlined above (atherectomy and stenting of prox LAD with inability to restore flow through mid LAD) - suspected late-presenting infarct with evidence of heart failure and LVEF 25-35%. F/u echo 03/19/20 showed EF 35-40%, grade 2 DD with severe akinesis of the left ventricular, mid-apical anteroseptal wall, mildy reduced RV function, mild MR. Post Admission also notable for hypoxia and wheezing with CXR showing increasing CHF and bilateral effusions, requiring IV Lasix and supplemental O2.   Assessment & Plan    1. CAD with  NSTEMI/suspected late presenting MI - s/p complex intervention with successful atherectomy and stenting of the proximal LAD; unable to restore flow through the mid LAD - uninterrupted DAPT x 12 months (was on this prior to admission as well) - continue statin, otherwise meds as below  2. Acute hypoxic respiratory failure suspected due to acute combined CHF with new LV dysfunction - still with borderline O2 sats of 90% on nasal cannula with occasional desats and soft BP - on low dose ARB and Lopressor but will hold pending discussion with MD regarding whether she needs more diuresis and/or consideration of inotropes  3.  Atrial fib went into around 1450 yesterday.  HR 100s to 120s.  Amiodarone IV going --converted to SR at 2034 last PM. Per tele.      --no anticoagulation added.      4. Essential HTN, now with hypotension - amlodipine now discontinued with LV dysfunction - managed in context of the above   5. Suspected CKD stage III - most recent OP Cr 1.25 - value was 1.3 pre cath - need to obtain BMET this AM  6. Mild normocytic anemia  - post-cath value similar to pre-cath value, no bleeding reported  For questions or updates, please contact [place] Please consult [email or web address] for contact info under    Signed, Cecilie Kicks, NP  03/21/2020, 10:18 AM   History and all data above reviewed.  Patient examined.  I agree with the findings as above.  She is very pleasant and denies any pain or SOB.  No distress The patient exam reveals COR:RRR  ,  Lungs: Clear  ,  Abd: Positive bowel sounds, no rebound no guarding, Ext Mild edema  .  All available labs, radiology testing, previous records reviewed. Agree with documented assessment and plan.  CAD:  Medical management as above.  Atrial fib:  She has now converted to NSR.   Given her frail state and DAPT I will avoid DOAC or warfarin at this time.   I will stop the amiodarone and monitor her in and out patient for further fib.  She  is doing poorly although she is not in distress.  Not tolerating meds.  I would like have a PICC line and check coox and CVP.  Likely will start milrinone.  Continue current Lasix dose for now.   Jeneen Rinks [facility]  11:37 AM  03/21/2020

## 2020-03-21 NOTE — Progress Notes (Addendum)
CTSP due to left mid abdominal pain and hypotension.  Patient's SBP dropped to 79mmHg.  She tells me her abdominal pain is new.  Had cath on Thursday via left groin which appears stable with no hematoma or bleeding.  Given NS bolus 250cc.  CVP 70mmHg.  Coox 88.5.   ON exam her right groin is stable with no hematoma and no ecchymosis on abdomen or back.  Her pain is mid abdomen and lower.  Denies any chest pain and no acute ST changes on EKG.  I think we need to consider retroperitoneal bleed given abdominal pain, hypotension and recent cath.  Will start Low dose dopamine due to SBP in the low 70's until we can get Levophed from pharmacy and then start Levo and titrate to keep MAP>65.  Get stat Abd/pelvic noncontrasted CT to rule out retroperitoneal bleed.  Transfer to CCU. Will get stat CBC and type and screen.

## 2020-03-21 NOTE — Progress Notes (Signed)
Pt removed CPAP around midnight because it was "burning her nose". Placed back on 3 L of O2

## 2020-03-21 NOTE — Progress Notes (Addendum)
Patient feeling better.  Started on Levo gtt and SBP now in the 120's.  Abdominal and pelvic CT with no retroperitoneal bleed.  ? Etiology of hypotension.  Will get blood cx x 2, procalcitonin and lactate to make sure patient is not septic.  WBC normal at 7.7, she is afebrile and Hbg stable at 10.7 (11.2 earlier today).  Will also repeat Co-ox.

## 2020-03-21 NOTE — Progress Notes (Signed)
Patient transferred from [place] with a cell phone, cell phone charger, glasses, clothing and shoes. All items placed in closet

## 2020-03-21 NOTE — Progress Notes (Signed)
Pt back in NSR; EKG in pt's chart.

## 2020-03-21 NOTE — Progress Notes (Signed)
Peripherally Inserted Central Catheter Placement  The IV Nurse has discussed with the patient and/or persons authorized to consent for the patient, the purpose of this procedure and the potential benefits and risks involved with this procedure.  The benefits include less needle sticks, lab draws from the catheter, and the patient may be discharged home with the catheter. Risks include, but not limited to, infection, bleeding, blood clot (thrombus formation), and puncture of an artery; nerve damage and irregular heartbeat and possibility to perform a PICC exchange if needed/ordered by physician.  Alternatives to this procedure were also discussed.  Bard Power PICC patient education guide, fact sheet on infection prevention and patient information card has been provided to patient /or left at bedside.  Daughter at bedside also agreeable to PICC.  PICC Placement Documentation  PICC Double Lumen 03/21/20 PICC Right Brachial 39 cm 0 cm (Active)  Indication for Insertion or Continuance of Line Vasoactive infusions;Chronic illness with exacerbations (CF, Sickle Cell, etc.) 03/21/20 1654  Exposed Catheter (cm) 0 cm 03/21/20 1654  Site Assessment Clean;Dry;Intact 03/21/20 1654  Lumen #1 Status Flushed;Saline locked;Blood return noted 03/21/20 1654  Lumen #2 Status Flushed;Saline locked;Blood return noted 03/21/20 1654  Dressing Type Transparent 03/21/20 1654  Dressing Status Clean;Dry;Intact;Antimicrobial disc in place 03/21/20 [place] Not Applicable 74/12/87 8676  Line Care Connections checked and tightened 03/21/20 1654  Line Adjustment (NICU/IV Team Only) No 03/21/20 1654  Dressing Intervention New dressing 03/21/20 1654  Dressing Change Due 03/28/20 03/21/20 1654       Deanna [name] 03/21/2020, 4:55 PM

## 2020-03-21 NOTE — Significant Event (Signed)
Rapid Response Event Note   Reason for Call : Hypotension and L sided abdominal pain.   Initial Focused Assessment:  Notified by nursing staff of pt with SBP in 60s with abdominal pain. Upon arrival, Deanna [name] was alert, oriented x4 and anxious about her pain and what was happening to her. She endorsed left sided abdominal pain at her diaphragm. No CP or SOB. NS 250cc bolus already infused. Dr. Radford Pax was at bedside. Skin is pale, warm and dry. Left groin dsg CDI with no palpable hematoma. Orders received for Dopamine gtt and Stat Abd CT.  Dopamine started. Amio gtt stopped when Dr. Radford Pax arrived. Pt transported to CT and then to 8M03 without complication.  2135- HR 93, 68/43 (51) CVP 9 2145- 95, 74/43(54) 2155-118, 85/49 (60) 2205- 111, 72/62 (68) 2215-108, 81/25 (40) 2225- 109, 96/72 (81)  Interventions:  -Dop gtt initiated at 48mcg/kg/min -Dopamine changed to Levophed -labs drawn as ordered -Stat ABD CT -tx to 2H05    MD Notified: Dr. Radford Pax at bedside Call Time: 2109 Arrival [id number] End Time: 2230  Madelynn Done, RN

## 2020-03-21 NOTE — Progress Notes (Signed)
  CARDIAC REHAB PHASE I   PRE:  Rate/Rhythm: 86 NSR    BP: sitting 95/53    SaO2: 96 3.5 L  MODE:  Ambulation: NA ft   POST:  Rate/Rhythm: 92 NSR    BP: sitting 97/57     SaO2: 99 3.5 L  0935-1000 Patient sleeping in bed upon arrival. Arousable. Patient confused to time. Oriented to self and place. Refused ambulation but willing to get up to chair x 2 assist with staff RN. Denied complaints. Amiodarone gtt continues. Call bed, bedside table and phone in reach. Chair alarm in place and connected to sensor. Patient instructed to not get up out of chair without calling for assistance. Verbalized understanding however it is noted patient has mild dementia. Will follow up with patient on Monday. Primary RN notified of patients status.   Floree Zuniga Minus Breeding RN, BSN

## 2020-03-22 ENCOUNTER — Encounter (HOSPITAL_COMMUNITY): Payer: Self-pay | Admitting: Cardiovascular Disease

## 2020-03-22 DIAGNOSIS — I214 Non-ST elevation (NSTEMI) myocardial infarction: Secondary | ICD-10-CM | POA: Diagnosis not present

## 2020-03-22 LAB — CBC
HCT: 35.6 % — ABNORMAL LOW (ref 36.0–46.0)
Hemoglobin: 11.3 g/dL — ABNORMAL LOW (ref 12.0–15.0)
MCH: 27.4 pg (ref 26.0–34.0)
MCHC: 31.7 g/dL (ref 30.0–36.0)
MCV: 86.4 fL (ref 80.0–100.0)
Platelets: 186 10*3/uL (ref 150–400)
RBC: 4.12 MIL/uL (ref 3.87–5.11)
RDW: 13.2 % (ref 11.5–15.5)
WBC: 10.3 10*3/uL (ref 4.0–10.5)
nRBC: 0 % (ref 0.0–0.2)

## 2020-03-22 LAB — BASIC METABOLIC PANEL
Anion gap: 15 (ref 5–15)
BUN: 25 mg/dL — ABNORMAL HIGH (ref 8–23)
CO2: 26 mmol/L (ref 22–32)
Calcium: 8.7 mg/dL — ABNORMAL LOW (ref 8.9–10.3)
Chloride: 96 mmol/L — ABNORMAL LOW (ref 98–111)
Creatinine, Ser: 1.57 mg/dL — ABNORMAL HIGH (ref 0.44–1.00)
GFR calc Af Amer: 36 mL/min — ABNORMAL LOW (ref >60)
GFR calc non Af Amer: 31 mL/min — ABNORMAL LOW (ref >60)
Glucose, Bld: 149 mg/dL — ABNORMAL HIGH (ref 70–99)
Potassium: 3.7 mmol/L (ref 3.5–5.1)
Sodium: 137 mmol/L (ref 135–145)

## 2020-03-22 LAB — PROCALCITONIN
Procalcitonin: <0.1 ng/mL
Procalcitonin: <0.1 ng/mL

## 2020-03-22 LAB — LACTIC ACID, PLASMA: Lactic Acid, Venous: 1.2 mmol/L (ref 0.5–1.9)

## 2020-03-22 MED ORDER — SODIUM CHLORIDE 0.9 % IV SOLN: INTRAVENOUS | Status: DC | PRN

## 2020-03-22 NOTE — Progress Notes (Signed)
Patient placed on nasal mask CPAP and tolerating well at this time.  RT will continue to monitor.

## 2020-03-22 NOTE — Progress Notes (Signed)
Progress Note  Patient Name: Deanna [name] Date of Encounter: 03/22/2020  Primary Cardiologist:   Quay Burow, MD   Subjective   Noted to have hypotension yesterday.  Complained of abdominal pain.  CT without acute findings. Treated with dopamine and the Levo.  Her BP cuff was switched to her leg (given history of left subclavian stenosis and her BP is normal.)   She continues to complain of pain in left upper quadrant .  Tender to touch despite normal CT.  Just had a large BM.   Inpatient Medications    Scheduled Meds: . aspirin EC  81 mg Oral Daily  . brimonidine  1 drop Both Eyes BID  . Chlorhexidine Gluconate Cloth  6 each Topical Daily  . clopidogrel  75 mg Oral Daily  . divalproex  250 mg Oral BID  . dorzolamide  1 drop Both Eyes BID  . escitalopram  20 mg Oral Daily  . latanoprost  1 drop Both Eyes QHS  . levothyroxine  188 mcg Oral Q0600  . mirabegron ER  50 mg Oral Daily  . pantoprazole  40 mg Oral Daily  . rosuvastatin  40 mg Oral Daily  . sodium chloride flush  3 mL Intravenous Q12H  . sodium chloride flush  3 mL Intravenous Q12H  . vitamin B-12  1,000 mcg Oral Daily   Continuous Infusions: . sodium chloride Stopped (03/22/20 0627)  . sodium chloride    . sodium chloride    . sodium chloride    . norepinephrine (LEVOPHED) Adult infusion Stopped (03/22/20 0627)   PRN Meds: sodium chloride, Place/Maintain arterial line **AND** sodium chloride, acetaminophen, acetaminophen, albuterol, ALPRAZolam, gabapentin, nitroGLYCERIN, ondansetron (ZOFRAN) IV, sodium chloride flush, tiZANidine   Vital Signs    Vitals:   03/22/20 0615 03/22/20 0630 03/22/20 0645 03/22/20 0700  BP: (!) 111/57 (!) 114/59 119/70 96/76  Pulse: 92 93  91  Resp: 20 19 17  (!) 24  Temp:      TempSrc:      SpO2: 98% 99%  98%  Weight:      Height:        Intake/Output Summary (Last 24 hours) at 03/22/2020 0735 Last data filed at 03/22/2020 0700 Gross per 24 hour  Intake 471.35 ml    Output 600 ml  Net -128.65 ml   Filed Weights   03/19/20 0430 03/20/20 0431 03/21/20 0500  Weight: 95.5 kg 94.9 kg 94.1 kg    Telemetry    NSR - Personally Reviewed  ECG    NA - Personally Reviewed  Physical Exam   GEN: No acute distress.   Neck: No  JVD Cardiac: RRR, no murmurs, rubs, or gallops.  Respiratory: Clear to auscultation bilaterally. GI: Soft, nontender, non-distended  MS: No edema; No deformity. Neuro:  Nonfocal  Psych: Normal affect   Labs    Chemistry Recent Labs  Lab 03/18/20 1918 03/19/20 0343 03/20/20 0852 03/21/20 0345 03/22/20 0440  NA  --    < > 138 138 137  K  --    < > 3.7 3.7 3.7  CL  --    < > 97* 95* 96*  CO2  --    < > 29 29 26   GLUCOSE  --    < > 144* 149* 149*  BUN  --    < > 20 23 25*  CREATININE  --    < > 1.39* 1.81* 1.57*  CALCIUM  --    < > 9.0 8.9  8.7*  PROT 5.9*  --   --   --   --   ALBUMIN 3.5  --   --   --   --   AST 28  --   --   --   --   ALT 9  --   --   --   --   ALKPHOS 50  --   --   --   --   BILITOT 1.4*  --   --   --   --   GFRNONAA  --    < > 36* 26* 31*  GFRAA  --    < > 42* 30* 36*  ANIONGAP  --    < > 12 14 15    < > = values in this interval not displayed.     Hematology Recent Labs  Lab 03/21/20 0345 03/21/20 2124 03/22/20 0440  WBC 8.9 7.7 10.3  RBC 4.06 3.86* 4.12  HGB 11.2* 10.7* 11.3*  HCT 35.4* 33.3* 35.6*  MCV 87.2 86.3 86.4  MCH 27.6 27.7 27.4  MCHC 31.6 32.1 31.7  RDW 13.6 13.4 13.2  PLT 142* 138* 186    Cardiac EnzymesNo results for input(s): TROPONINI in the last 168 hours. No results for input(s): TROPIPOC in the last 168 hours.   BNP Recent Labs  Lab 03/18/20 1918 03/21/20 0345  BNP 912.2* 656.6*     DDimer No results for input(s): DDIMER in the last 168 hours.   Radiology    CT ABDOMEN PELVIS WO CONTRAST  Result Date: 03/21/2020 CLINICAL DATA:  Retroperitoneal hematoma suspected. Cardiac catheterization 2 days ago. EXAM: CT ABDOMEN AND PELVIS WITHOUT CONTRAST  TECHNIQUE: Multidetector CT imaging of the abdomen and pelvis was performed following the standard protocol without IV contrast. COMPARISON:  Chest radiograph 03/19/2020.  Abdominal CT 04/27/2015 FINDINGS: Lower chest: Small bilateral pleural effusions and basilar atelectasis. Mild basilar septal thickening suspicious for pulmonary edema. Coronary artery calcifications/stents. Mild cardiomegaly. Hepatobiliary: No evidence of focal hepatic abnormality on noncontrast exam. Unremarkable gallbladder. No biliary dilatation. Pancreas: No ductal dilatation or inflammation. Spleen: Normal in size. No definite focal abnormality on noncontrast exam. Adrenals/Urinary Tract: No adrenal nodule or evidence of hemorrhage. Mild symmetric perinephric edema is similar to that seen on prior exam. There is no hydronephrosis. There is a 3.6 cm simple cyst in the lower right kidney. No renal calculi. Urinary bladder is partially distended. No bladder wall thickening. Stomach/Bowel: Tiny hiatal hernia. Ingested material within the stomach. There is no small bowel obstruction or inflammatory change. Appendix tentatively visualized and normal. Colonic tortuosity with moderate colonic stool burden. Mild diverticulosis involving the distal descending and sigmoid colon. No diverticulitis. Stool in the rectum. Vascular/Lymphatic: Dense aortic atherosclerosis. No aortic aneurysm. There is mild peripheral branch atherosclerosis. Femoral femoral bypass graft. Small amount of simple fluid adjacent to the midportion of the graft. There is stranding adjacent to the left femoral vessels but no confluent hematoma. There is no retroperitoneal stranding extending into the abdominopelvic cavity. Reproductive: Heterogeneous uterus with calcification along the anterior body on the left, unchanged. Ovaries appear quiescent. No suspicious adnexal mass. Other: No fluid or retroperitoneal stranding to suggest retroperitoneal hematoma. No stranding of the  abdominal wall musculature. There is no intra-abdominal ascites. No free air. Tiny fat containing umbilical hernia. Scattered peripherally calcified densities in the subcutaneous tissues likely granulomas. Musculoskeletal: Degenerative change involving the spine and both hips. There are no acute or suspicious osseous abnormalities. IMPRESSION: 1. No evidence of retroperitoneal hematoma. Mild  stranding in the left groin adjacent to the femoral vessels but no confluent hematoma. There is no retroperitoneal stranding extending into the abdominopelvic cavity. 2. Small amount of simple fluid adjacent to the midportion of the left femoral bypass graft, nonspecific, increased from 2016 but simple fluid density. 3. Small bilateral pleural effusions and atelectasis. Basilar septal thickening suspicious for pulmonary edema. 4. Colonic tortuosity with moderate colonic stool burden, can be seen with constipation. Colonic diverticulosis without diverticulitis. Aortic Atherosclerosis (ICD10-I70.0). Electronically Signed   By: Keith Rake M.D.   On: 03/21/2020 22:26   [place] EKG SITE RITE  Result Date: 03/21/2020 If Site Rite image not attached, placement could not be confirmed due to current cardiac rhythm.   Cardiac Studies   ECHO:  1. Left ventricular ejection fraction, by estimation, is 35 to 40%. The  left ventricle has moderately decreased function. The left ventricle  demonstrates regional wall motion abnormalities (see scoring  diagram/findings for description). Left ventricular  diastolic parameters are consistent with Grade II diastolic dysfunction  (pseudonormalization). There is severe akinesis of the left ventricular,  mid-apical anteroseptal wall.  2. Right ventricular systolic function is mildly reduced. The right  ventricular size is normal.  3. The mitral valve is grossly normal. Mild mitral valve regurgitation.  4. The aortic valve is normal in structure. Aortic valve regurgitation is    not visualized. No aortic stenosis is present.   Diagnostic Dominance: Right  Intervention      Patient Profile     79 y.o. female with pmh of CAD s/p CABG in 1999(LHC 2013: atretic LIMA, patent SVG-OM, occluded SVG-RCA), PDA (Rfem-fem bypass, L subclavian stenosis s/p LSCA to L carotid bypass), HTN, HLD admitted with CP and elevated troponin concerning for NSTEMI.   Assessment & Plan    NSTEMI/CAD:  Incomplete revascularization with resultant ischemic cardiomyopathy this week.    Medical management.   ACUTE SYSTOLIC HF:  Unable to titrate meds. However, we might be able to titrate if we feel over the next 24 hours that we can get an accurate BP reading based on leg pressures.    HYPOTENSION:    No evidence of retroperitoneal bleed.  PICC placed yesterday and initial CVP was 7 (now 13).    She did not appear to be in cardiogenic shock.  (CoOx 88.5.  Lactic acid and prolactin NL.  Blood cultures have been drawn.  Of note, because of the PICC in the right arm they were using the left arm for BPs.  She has previous know occluded left subclavian.   Resolved and might have been spurious.   AKI:  Creat is stable/slightly improved.   ATRIAL FIB:  Off of amiodarone.   Not on anticoagulation as she is on DAPT and this was a very limited episode of fib.   Continue to monitor.    RESPIRATORY FAILURE:   She has not oxygenated well since her admission.  Had desaturation requiring 10 liters two nights ago.  However, CVP was not significantly elevated.  BNP mildly increased.    CXR and exam did suggest pulmonary edema a couple of days ago and she was given Lasix.  None yesterday.  Today continue supportive care and check CXR.  No diuretic.  Needs to be up and sitting in chair.  Likely will need to go home with O2.     For questions or updates, please contact [facility] Please consult [email or web address] for contact info under Cardiology/STEMI.   Signed, Minus Breeding, MD  03/22/2020,  7:35 AM

## 2020-03-22 NOTE — Progress Notes (Signed)
Pt has right upper arm PICC line. Levophed infusion noted to be alarming occlusion on patient side. Attempts to reposition lines to allow flow unsuccessful. Attempted to hand flush the line proved difficult and only possible if the line is pulled taut. Levo drip held.

## 2020-03-22 NOTE — Progress Notes (Signed)
Dr.Turner ordered to stop Dopamine & ordered  To change the dopamine to Levophed.& transported pt.to Ct scan. & report given to [facility] ICU nurse.

## 2020-03-22 NOTE — Progress Notes (Signed)
[facility] Rapid response nurse came to see pt.too.& started dopamine gtt as ordered.

## 2020-03-22 NOTE — Progress Notes (Signed)
Pt reports a previous medical history of carotid bypass of unknown age.Blood pressure cuff moved from noted left arm (placed there prior to transfer) to left leg, with a significant increase in blood pressure. Will titirate levophed to maintain MAP greater than 65 per MD order.

## 2020-03-22 NOTE — Progress Notes (Signed)
Transported pt.from CT scan to 2H room 05.accopmanied by Shanon Brow.RN .

## 2020-03-22 NOTE — Progress Notes (Signed)
Pt.c/o left abd.pain & noted B/P is low ;73/56;manual B/P taken-64/50;Rapid response nurse & MD on call was called & made aware & ordered to give 250cc bolus NS & said she is coming to see her.Dr.Turner came to see her & ordered to do stat h&h & type & screen & stat CT of abd.w/o contrast.

## 2020-03-23 ENCOUNTER — Encounter (HOSPITAL_COMMUNITY): Payer: Self-pay | Admitting: Cardiovascular Disease

## 2020-03-23 DIAGNOSIS — I10 Essential (primary) hypertension: Secondary | ICD-10-CM | POA: Diagnosis not present

## 2020-03-23 DIAGNOSIS — I214 Non-ST elevation (NSTEMI) myocardial infarction: Secondary | ICD-10-CM | POA: Diagnosis not present

## 2020-03-23 DIAGNOSIS — I25708 Atherosclerosis of coronary artery bypass graft(s), unspecified, with other forms of angina pectoris: Secondary | ICD-10-CM

## 2020-03-23 DIAGNOSIS — R1012 Left upper quadrant pain: Secondary | ICD-10-CM | POA: Diagnosis not present

## 2020-03-23 DIAGNOSIS — K5900 Constipation, unspecified: Secondary | ICD-10-CM | POA: Diagnosis not present

## 2020-03-23 DIAGNOSIS — E782 Mixed hyperlipidemia: Secondary | ICD-10-CM

## 2020-03-23 DIAGNOSIS — Z7902 Long term (current) use of antithrombotics/antiplatelets: Secondary | ICD-10-CM | POA: Diagnosis not present

## 2020-03-23 LAB — LIPASE, BLOOD: Lipase: 24 U/L (ref 11–51)

## 2020-03-23 LAB — CBC
HCT: 31.1 % — ABNORMAL LOW (ref 36.0–46.0)
Hemoglobin: 10 g/dL — ABNORMAL LOW (ref 12.0–15.0)
MCH: 28.4 pg (ref 26.0–34.0)
MCHC: 32.2 g/dL (ref 30.0–36.0)
MCV: 88.4 fL (ref 80.0–100.0)
Platelets: 156 10*3/uL (ref 150–400)
RBC: 3.52 MIL/uL — ABNORMAL LOW (ref 3.87–5.11)
RDW: 13.4 % (ref 11.5–15.5)
WBC: 6 10*3/uL (ref 4.0–10.5)
nRBC: 0 % (ref 0.0–0.2)

## 2020-03-23 LAB — HEPATIC FUNCTION PANEL
ALT: 8 U/L (ref 0–44)
AST: 15 U/L (ref 15–41)
Albumin: 2.6 g/dL — ABNORMAL LOW (ref 3.5–5.0)
Alkaline Phosphatase: 47 U/L (ref 38–126)
Bilirubin, Direct: <0.1 mg/dL (ref 0.0–0.2)
Total Bilirubin: 0.9 mg/dL (ref 0.3–1.2)
Total Protein: 5.4 g/dL — ABNORMAL LOW (ref 6.5–8.1)

## 2020-03-23 LAB — BASIC METABOLIC PANEL
Anion gap: 10 (ref 5–15)
BUN: 24 mg/dL — ABNORMAL HIGH (ref 8–23)
CO2: 29 mmol/L (ref 22–32)
Calcium: 8.7 mg/dL — ABNORMAL LOW (ref 8.9–10.3)
Chloride: 98 mmol/L (ref 98–111)
Creatinine, Ser: 1.59 mg/dL — ABNORMAL HIGH (ref 0.44–1.00)
GFR calc Af Amer: 36 mL/min — ABNORMAL LOW (ref >60)
GFR calc non Af Amer: 31 mL/min — ABNORMAL LOW (ref >60)
Glucose, Bld: 135 mg/dL — ABNORMAL HIGH (ref 70–99)
Potassium: 3.5 mmol/L (ref 3.5–5.1)
Sodium: 137 mmol/L (ref 135–145)

## 2020-03-23 LAB — AMYLASE: Amylase: 19 U/L — ABNORMAL LOW (ref 28–100)

## 2020-03-23 LAB — PROCALCITONIN: Procalcitonin: <0.1 ng/mL

## 2020-03-23 MED ORDER — METOPROLOL TARTRATE 12.5 MG HALF TABLET
12.5000 mg | ORAL_TABLET | Freq: Two times a day (BID) | ORAL | Status: DC
  Administered 2020-03-23 – 2020-03-24 (×4): 12.5 mg via ORAL
  Filled 2020-03-23 (×4): qty 1

## 2020-03-23 MED ORDER — MAGNESIUM HYDROXIDE 400 MG/5ML PO SUSP
15.0000 mL | Freq: Every day | ORAL | Status: DC
  Administered 2020-03-23 – 2020-03-30 (×6): 15 mL via ORAL
  Filled 2020-03-23 (×7): qty 30

## 2020-03-23 MED ORDER — SIMETHICONE 80 MG PO CHEW
80.0000 mg | CHEWABLE_TABLET | Freq: Once | ORAL | Status: AC
  Administered 2020-03-23: 80 mg via ORAL
  Filled 2020-03-23: qty 1

## 2020-03-23 MED ORDER — BISACODYL 5 MG PO TBEC
5.0000 mg | DELAYED_RELEASE_TABLET | Freq: Once | ORAL | Status: AC
  Administered 2020-03-23: 5 mg via ORAL
  Filled 2020-03-23: qty 1

## 2020-03-23 MED ORDER — POLYETHYLENE GLYCOL 3350 17 G PO PACK
17.0000 g | PACK | Freq: Two times a day (BID) | ORAL | Status: AC
  Administered 2020-03-23 – 2020-03-24 (×3): 17 g via ORAL
  Filled 2020-03-23 (×3): qty 1

## 2020-03-23 MED ORDER — ALPRAZOLAM 0.5 MG PO TABS
0.5000 mg | ORAL_TABLET | Freq: Once | ORAL | Status: AC | PRN
  Administered 2020-03-23: 0.5 mg via ORAL
  Filled 2020-03-23: qty 1

## 2020-03-23 NOTE — Progress Notes (Addendum)
Progress Note  Patient Name: Deanna [name] Date of Encounter: 03/23/2020  [facility] Cardiologist: Quay Burow, MD   Subjective   No acute overnight events. Patient's only complaint this morning is continued left upper quadrant pain. Worse after having bowel movement and eating. No chest pain. No shortness of breath. Able to lay completely flat with no orthopnea. No palpitations, lightheadedness, or dizziness.   Inpatient Medications    Scheduled Meds: . aspirin EC  81 mg Oral Daily  . brimonidine  1 drop Both Eyes BID  . Chlorhexidine Gluconate Cloth  6 each Topical Daily  . clopidogrel  75 mg Oral Daily  . divalproex  250 mg Oral BID  . dorzolamide  1 drop Both Eyes BID  . escitalopram  20 mg Oral Daily  . latanoprost  1 drop Both Eyes QHS  . levothyroxine  188 mcg Oral Q0600  . mirabegron ER  50 mg Oral Daily  . pantoprazole  40 mg Oral Daily  . rosuvastatin  40 mg Oral Daily  . sodium chloride flush  3 mL Intravenous Q12H  . sodium chloride flush  3 mL Intravenous Q12H  . vitamin B-12  1,000 mcg Oral Daily   Continuous Infusions: . sodium chloride Stopped (03/22/20 0627)  . sodium chloride    . sodium chloride    . sodium chloride    . norepinephrine (LEVOPHED) Adult infusion Stopped (03/22/20 0627)   PRN Meds: sodium chloride, Place/Maintain arterial line **AND** sodium chloride, acetaminophen, acetaminophen, albuterol, ALPRAZolam, gabapentin, nitroGLYCERIN, ondansetron (ZOFRAN) IV, sodium chloride flush, tiZANidine   Vital Signs    Vitals:   03/22/20 1953 03/22/20 2304 03/23/20 0400 03/23/20 0803  BP: 122/60  (!) 109/56 (!) 146/67  Pulse: 88 86 83 89  Resp: (!) _0 Temp: 97.9 F (36.6 C)  98 F (36.7 C) 97.8 F (36.6 C)  TempSrc: Oral  Oral Oral  SpO2: 94% 96% 92% 97%  Weight:   97.8 kg   Height:        Intake/Output Summary (Last 24 hours) at 03/23/2020 0849 Last data filed at 03/23/2020 0400 Gross per 24 hour  Intake 480 ml  Output  950 ml  Net -470 ml   Last 3 Weights 03/23/2020 03/21/2020 03/20/2020  Weight (lbs) 215 lb 9.8 oz 207 lb 7.3 oz 209 lb 3.5 oz  Weight (kg) 97.8 kg 94.1 kg 94.9 kg  Some encounter information is confidential and restricted. Go to Review Flowsheets activity to see all data.      Telemetry    Normal sinus rhythm with rates in the 80's. - Personally Reviewed  ECG    No new ECG tracing today. - Personally Reviewed  Physical Exam   GEN: No acute distress.   Neck: No JVD. Cardiac: RRR. No murmurs, rubs, or gallops. Left femoral cath site soft with no signs of hematoma. Respiratory: Clear to auscultation bilaterally.  GI: Soft, non-distended, and tender to palpation of left upper quadrant. No rebound tenderness. MS: No significant lower extremity edema. No deformity. Skin: Warm and dry. Neuro:  No focal deficits. Psych: Normal affect. Responds appropriately.  Labs    High Sensitivity Troponin:   Recent Labs  Lab 03/18/20 1537 03/18/20 1737 03/18/20 2248 03/19/20 0502  TROPONINIHS 3,720* 4,011* 3,425* 3,370*      Chemistry Recent Labs  Lab 03/18/20 1918 03/19/20 0343 03/21/20 0345 03/22/20 0440 03/23/20 0633  NA  --    < > 138 137 137  K  --    < >  3.7 3.7 3.5  CL  --    < > 95* 96* 98  CO2  --    < > _0 GLUCOSE  --    < > 149* 149* 135*  BUN  --    < > 23 25* 24*  CREATININE  --    < > 1.81* 1.57* 1.59*  CALCIUM  --    < > 8.9 8.7* 8.7*  PROT 5.9*  --   --   --   --   ALBUMIN 3.5  --   --   --   --   AST 28  --   --   --   --   ALT 9  --   --   --   --   ALKPHOS 50  --   --   --   --   BILITOT 1.4*  --   --   --   --   GFRNONAA  --    < > 26* 31* 31*  GFRAA  --    < > 30* 36* 36*  ANIONGAP  --    < > _1 < > = values in this interval not displayed.     Hematology Recent Labs  Lab 03/21/20 2124 03/22/20 0440 03/23/20 0633  WBC 7.7 10.3 6.0  RBC 3.86* 4.12 3.52*  HGB 10.7* 11.3* 10.0*  HCT 33.3* 35.6* 31.1*  MCV 86.3 86.4 88.4  MCH 27.7  27.4 28.4  MCHC 32.1 31.7 32.2  RDW 13.4 13.2 13.4  PLT 138* 186 156    BNP Recent Labs  Lab 03/18/20 1918 03/21/20 0345  BNP 912.2* 656.6*     DDimer No results for input(s): DDIMER in the last 168 hours.   Radiology    CT ABDOMEN PELVIS WO CONTRAST  Result Date: 03/21/2020 CLINICAL DATA:  Retroperitoneal hematoma suspected. Cardiac catheterization 2 days ago. EXAM: CT ABDOMEN AND PELVIS WITHOUT CONTRAST TECHNIQUE: Multidetector CT imaging of the abdomen and pelvis was performed following the standard protocol without IV contrast. COMPARISON:  Chest radiograph 03/19/2020.  Abdominal CT 04/27/2015 FINDINGS: Lower chest: Small bilateral pleural effusions and basilar atelectasis. Mild basilar septal thickening suspicious for pulmonary edema. Coronary artery calcifications/stents. Mild cardiomegaly. Hepatobiliary: No evidence of focal hepatic abnormality on noncontrast exam. Unremarkable gallbladder. No biliary dilatation. Pancreas: No ductal dilatation or inflammation. Spleen: Normal in size. No definite focal abnormality on noncontrast exam. Adrenals/Urinary Tract: No adrenal nodule or evidence of hemorrhage. Mild symmetric perinephric edema is similar to that seen on prior exam. There is no hydronephrosis. There is a 3.6 cm simple cyst in the lower right kidney. No renal calculi. Urinary bladder is partially distended. No bladder wall thickening. Stomach/Bowel: Tiny hiatal hernia. Ingested material within the stomach. There is no small bowel obstruction or inflammatory change. Appendix tentatively visualized and normal. Colonic tortuosity with moderate colonic stool burden. Mild diverticulosis involving the distal descending and sigmoid colon. No diverticulitis. Stool in the rectum. Vascular/Lymphatic: Dense aortic atherosclerosis. No aortic aneurysm. There is mild peripheral branch atherosclerosis. Femoral femoral bypass graft. Small amount of simple fluid adjacent to the midportion of the  graft. There is stranding adjacent to the left femoral vessels but no confluent hematoma. There is no retroperitoneal stranding extending into the abdominopelvic cavity. Reproductive: Heterogeneous uterus with calcification along the anterior body on the left, unchanged. Ovaries appear quiescent. No suspicious adnexal mass. Other: No fluid or retroperitoneal stranding to suggest retroperitoneal hematoma. No stranding of the  abdominal wall musculature. There is no intra-abdominal ascites. No free air. Tiny fat containing umbilical hernia. Scattered peripherally calcified densities in the subcutaneous tissues likely granulomas. Musculoskeletal: Degenerative change involving the spine and both hips. There are no acute or suspicious osseous abnormalities. IMPRESSION: 1. No evidence of retroperitoneal hematoma. Mild stranding in the left groin adjacent to the femoral vessels but no confluent hematoma. There is no retroperitoneal stranding extending into the abdominopelvic cavity. 2. Small amount of simple fluid adjacent to the midportion of the left femoral bypass graft, nonspecific, increased from 2016 but simple fluid density. 3. Small bilateral pleural effusions and atelectasis. Basilar septal thickening suspicious for pulmonary edema. 4. Colonic tortuosity with moderate colonic stool burden, can be seen with constipation. Colonic diverticulosis without diverticulitis. Aortic Atherosclerosis (ICD10-I70.0). Electronically Signed   By: Keith Rake M.D.   On: 03/21/2020 22:26   [place] EKG SITE RITE  Result Date: 03/21/2020 If Site Rite image not attached, placement could not be confirmed due to current cardiac rhythm.   Cardiac Studies   Left Heart Catheterization 03/19/2020:  Ost LAD to Prox LAD lesion is 99% stenosed. LIMA to LAD known to be atretic.  After orbital atherectomy, A drug-eluting stent was successfully placed using a STENT RESOLUTE ONYX 3.5X15.  Post intervention, there is a 0% residual  stenosis.  Mid LAD lesion is 100% stenosed. Despite atherectomy and angioplasty, flow could not be restored.  Mid Cx lesion is 100% stenosed. SVG to OM patent.  Prox RCA lesion is 100% stenosed. SVG to PDA occluded.  There is moderate left ventricular systolic dysfunction.  LV end diastolic pressure is moderately elevated.  The left ventricular ejection fraction is 25-35% by visual estimate.  There is no aortic valve stenosis.  Balloon angioplasty was performed using a BALLOON SAPPHIRE 2.0X12.   Complex intervention due to severe three-vessel coronary artery disease with prior bypass surgery.  Successful atherectomy and stenting of the proximal LAD.  Unable to restore flow through the mid LAD.  I suspect, she is a somewhat late presenting infarct.  She already has evidence of heart failure with elevated LVEDP.  Will need ongoing medical therapy to help her LV dysfunction. _______________  Echocardiogram 03/19/2020: Impressions: 1. Left ventricular ejection fraction, by estimation, is 35 to 40%. The  left ventricle has moderately decreased function. The left ventricle  demonstrates regional wall motion abnormalities (see scoring  diagram/findings for description). Left ventricular  diastolic parameters are consistent with Grade II diastolic dysfunction  (pseudonormalization). There is severe akinesis of the left ventricular,  mid-apical anteroseptal wall.  2. Right ventricular systolic function is mildly reduced. The right  ventricular size is normal.  3. The mitral valve is grossly normal. Mild mitral valve regurgitation.  4. The aortic valve is normal in structure. Aortic valve regurgitation is  not visualized. No aortic stenosis is present.   Patient Profile     79 y.o. female with a history of CAD s/p CABG in 1999, PAD with right fem-fem bypass and left subclavian stenosis s/p LSCA to left carotid bypass, hypertension, and hyperlipidemia who was admitted on 03/18/2020 with  NSTEMI.  Assessment & Plan    NSTEMI with Known CAD - High-sensitivity troponin peaked at 4,011.  - Echo showed LVEF of 35-40% with severe akinesis of mid-apical anteroseptal wall and grade 2 diastolic dysfunction.  - LHC showed 99% stenosis of ostial to proximal LAD followed by 100% stenosis of mid LAD with known atretic LIMA to LAD. S/p successful atherectomy and DES to proximal  LAD but unable to restore flow through mid LAD. Felt to be somewhat of late presenting infarct. - Continue DAPT with Aspirin and Plavix.  - Continue high-intensity statin.  - Beta-blocker was being held due to hypotension. BP looks to be improving so will add back Lopressor 12.75m twice daily.  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 - LVEF of 35-40% as above. - Received a few doses of IV Lasix. Last dose on 8/12. Net negative 219 mL this admission. Weight up 5lbs from admission. Renal function stable. - Laying completely flat without any significant dyspnea. Does not appear significantly volume overloaded. Will hold off on additional diuresis at this time. - Irbesartan and Lopressor held for hypotension. - Continue to monitor daily weights, strict I/O's, and renal function. - She is still on 5L of supplemental O2. Suspect she will need to go home on O2.  Hypotension - Had hypotension on 03/21/2020 briefly requiring pressors (Levophed and Dopamine). Lactic acid, procalcitonin, and blood cultures normal so far. BP cuff switched to leg given history of subclavian stenosis and BP has been normal.  - Most recent BP 146/67. - Can try to add back antihypertensives slowly.   Left Upper Quadrant Pain - Patient continues to complain of left upper quadrant pain - she states it feels like a "gas pocket." Worse after having a bowel movement and after eating. She is tender to palpation on exam. - CT on 8/14 negative for retroperitoneal hematoma. Did show colonic tortuosity  with moderate colonic stool burden. - LFTs on 03/18/2020: AST 28, ALT 9, Alk Phos 50, Total Bili 1.4 (mildly elevated), Direct Bili 0.3 (mildly elevated), Indirect Bili 1.1 (mildly elevated). - Will check lipase.  - Will check hemoccult given drop in hemoglobin. - Continue Protonix.  - Will give Milk of Magnesia to help with constipation and Simethicone to help with gas. - Discussed with MD - will go ahead and consult GI.  PAD  - History of left to right fem-fem bypass and left subclavian to left common carotid bypass for left subclavian artery stenosis. Also history of right femoral pseudoaneurysm repair. - Continue DAPT and statin.  Atrial Fibrillation - Brief episode of atrial fibrillation this admission. Started on Amiodarone but this has been discontinued.  - Maintaining sinus rhythm. - Was not placed on anticoagulation as she is on DAPT and this was a very limited episode. - Continue to monitor.  Hyperlipidemia - Lipid panel this admission: Total Cholesterol 90, Triglycerides 172, HDL 26, LDL 30. - At LDL goal <70 given CAD. - Started on Crestor 432mdaily. - Will need repeat lipid panel and LFTs in 6-8 weeks.  Anemia - Hemoglobin 10.0 today, down from 12.4 on admission. - Will check hemoccult.  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 Peaked at 1.81 on 8/14 and now 1.59. Baseline around 0.9 to 1.2. - Continue to monitor.  For questions or updates, please contact [facility] consult [email or web address] for contact info under     Signed, CaDarreld McleanPA-C  03/23/2020, 8:49 AM    The patient was seen, examined and discussed with CaDarreld McleanPA-C  and I agree with the above.   The patient has significant left upper quadrant pain that is worse after food intake.  Her abdominal CT showed no retroperitoneal bleed and significant stool impaction within the colon, she is getting muscle relaxants, we will add milk of magnesia as well as  simethicone  and consult GI for further assistance.  Her hemoglobin is 10.0 today from her baseline 12.4.  From cardiac standpoint she has no further chest pain or shortness of breath.  Telemetry does not show any further episodes of atrial fibrillation.  Amiodarone has been discontinued.  Because this was first in brief episode of atrial fibrillation she was not started on systemic anticoagulation and she is already on dual antiplatelet therapy with aspirin and Plavix.  Ena Dawley, MD 03/23/2020

## 2020-03-23 NOTE — Progress Notes (Signed)
Pt has Xanax 0.5mg  Daily PRN and received dose this am. Pt is requesting dose for tonight. Provider on call paged via amion. Jessie Foot, RN

## 2020-03-23 NOTE — Progress Notes (Signed)
CARDIAC REHAB PHASE I   PRE:  Rate/Rhythm: 93 SR  BP:  Supine: 124/67  Sitting:   Standing:    SaO2: 95% 5L  MODE:  Ambulation: to recliner ft  [phone] Asked PA for PT consult for discharge recommendations. Pt walked a few steps to recliner with asst x 2. On 5L oxygen. Put chair alarm in recliner. RN reapplied purewick. Encouraged pt to stay in recliner. Cardiologist in to assess pt. Pt stated she knew she is not thinking as clearly as she should be. Emotional support given.   Graylon Good, RN BSN  03/23/2020 9:43 AM  93 SR

## 2020-03-23 NOTE — Evaluation (Signed)
Physical Therapy Evaluation Patient Details Name: Deanna [name] MRN: [id number] DOB: 1941-06-29 Today's Date: 03/23/2020   History of Present Illness  Pt is a 79 y/o female admitted secondary to SOB and chest pain. Found to have NSTEMI and is s/p heart cath with arthrectomy. Pt also with respiratory failure during admission and requiring supplemental oxygen. PMH includes HTN, CHF, a fib, CAD, and CKD.   Clinical Impression  Pt admitted secondary to problem above with deficits below. Pt requiring min guard A to stand and ambulate short distance. Pt with increased pain and oxygen sats decreased to 87% on 5L with short distance. Increased to >90% on 5L with seated rest. Anticipate pt will progress well once feeling better. Will continue to follow acutely to maximize functional mobility independence and safety.     Follow Up Recommendations Home health PT;Supervision for mobility/OOB (pending progression )    Equipment Recommendations  Other (comment) (TBD)    Recommendations for Other Services       Precautions / Restrictions Precautions Precautions: Fall Precaution Comments: watch O2 Restrictions Weight Bearing Restrictions: No      Mobility  Bed Mobility               General bed mobility comments: In recliner upon entry   Transfers Overall transfer level: Needs assistance Equipment used: Rolling walker (2 wheeled) Transfers: Sit to/from Stand Sit to Stand: Min guard         General transfer comment: Min guard for safety and steadying. Cues for safe hand placement.   Ambulation/Gait Ambulation/Gait assistance: Min guard Gait Distance (Feet): 10 Feet Assistive device: Rolling walker (2 wheeled) Gait Pattern/deviations: Step-through pattern;Decreased stride length;Antalgic Gait velocity: Decreased   General Gait Details: Slow, mildly antalgic gait. Distance limited secondary to pain and decreased oxygen sats. Sats decreased to 87% on 5L with short distance  ambulation. Returned to >90% on 5L with seated rest.   Stairs            Wheelchair Mobility    Modified Rankin (Stroke Patients Only)       Balance Overall balance assessment: Needs assistance Sitting-balance support: No upper extremity supported Sitting balance-Leahy Scale: Fair     Standing balance support: Bilateral upper extremity supported;During functional activity Standing balance-Leahy Scale: Poor Standing balance comment: Reliant on BUE support                              Pertinent Vitals/Pain Pain Assessment: Faces Faces Pain Scale: Hurts even more Pain Location: L groinand lower abdomen Pain Descriptors / Indicators: Grimacing;Guarding Pain Intervention(s): Limited activity within patient's tolerance;Monitored during session;Repositioned    Home Living Family/patient expects to be discharged to:: Private residence Living Arrangements: Children Available Help at Discharge: Family Type of Home: House Home Access: Level entry     Home Layout: One level Home Equipment: Environmental consultant - 4 wheels;Shower seat      Prior Function Level of Independence: Independent with assistive device(s)         Comments: Reports occasionally using a rollator.      Hand Dominance        Extremity/Trunk Assessment   Upper Extremity Assessment Upper Extremity Assessment: Defer to OT evaluation    Lower Extremity Assessment Lower Extremity Assessment: Generalized weakness    Cervical / Trunk Assessment Cervical / Trunk Assessment: Normal  Communication   Communication: No difficulties  Cognition Arousal/Alertness: Lethargic Behavior During Therapy: WFL for tasks assessed/performed Overall Cognitive  Status: No family/caregiver present to determine baseline cognitive functioning                                 General Comments: Pt initially very lethargic. Pt with increased alertness as session progressed. Somewhat slowed processing, but  likely because pt woke up.       General Comments      Exercises     Assessment/Plan    PT Assessment Patient needs continued PT services  PT Problem List Decreased strength;Decreased balance;Decreased activity tolerance;Decreased mobility;Decreased knowledge of use of DME;Decreased knowledge of precautions       PT Treatment Interventions Gait training;DME instruction;Therapeutic activities;Functional mobility training;Therapeutic exercise;Balance training;Patient/family education    PT Goals (Current goals can be found in the Care Plan section)  Acute Rehab PT Goals Patient Stated Goal: to feel better PT Goal Formulation: With patient Time For Goal Achievement: 04/06/20 Potential to Achieve Goals: Good    Frequency Min 3X/week   Barriers to discharge        Co-evaluation               AM-PAC PT "6 Clicks" Mobility  Outcome Measure Help needed turning from your back to your side while in a flat bed without using bedrails?: A Little Help needed moving from lying on your back to sitting on the side of a flat bed without using bedrails?: A Little Help needed moving to and from a bed to a chair (including a wheelchair)?: A Little Help needed standing up from a chair using your arms (e.g., wheelchair or bedside chair)?: A Little Help needed to walk in hospital room?: A Little Help needed climbing 3-5 steps with a railing? : A Lot 6 Click Score: 17    End of Session Equipment Utilized During Treatment: Gait belt;Oxygen Activity Tolerance: Treatment limited secondary to medical complications (Comment);Patient limited by pain (decreased oxygen sats) Patient left: in chair;with call bell/phone within reach;with chair alarm set Nurse Communication: Mobility status PT Visit Diagnosis: Unsteadiness on feet (R26.81);Muscle weakness (generalized) (M62.81);Pain Pain - Right/Left: Left Pain - part of body:  (abdomen)    Time: 9024-0973 PT Time Calculation (min) (ACUTE  ONLY): 15 min   Charges:   PT Evaluation $PT Eval Moderate Complexity: 1 Mod          Reuel Derby, PT, DPT  Acute Rehabilitation Services  Pager: [phone] Office: [phone]   Rudean Hitt 03/23/2020, 3:50 PM

## 2020-03-23 NOTE — Consult Note (Signed)
[facility] Consult: 11:16 AM 03/23/2020  LOS: 5 days    Referring Provider: Dr Percival Spanish  Primary Care Physician:  Lauree Chandler, NP Primary Gastroenterologist:  Dr Henrene Pastor    Reason for Consultation:  LUQ pain and anemia.     HPI: Deanna [name] is a 79 y.o. female.  Past medical history CAD, PAD and multiple other issues as listed below. Fatty liver seen on ultrasounds dating back to 2002. Intermittent Thrombocytopenia dating to 2012, platelets 77 in 2016 .  12/2015 colonoscopy.  For surveillance of polyps, small tubular adenomas in 2010, nonspecified type polyps 2003, brother with colon cancer in his 24s.  5 mm sigmoid polyp removed (TA,  No HGC), left and right colon diverticulosis  Admitted last week with non-STEMI, late presenting infarct.  Troponins peaked at [facility].  Underwent atherectomy and DES placement to proximal LAD, unable to restore flow through the mid LAD. On echo, LVEF 35 to 40%.  Treated briefly with Levophed/dopamine for hypotension. She has complained of pain in LUQ, feels like there is a gas pocket there.  This is new since this admission.  It is most prominent in the morning after her breakfast and persist throughout the day, worsened by meals to the point where she is reluctant to eat.  Intensity at worse ~ 5 to 6/10.  No nausea, vomiting.  Normal pattern of BMs is every other day with a total of about 3 bowel movements a week, brown, not bloody.  Had not had a bowel movement in several days but had nonmelenic, nonbloody stool yesterday which was hard to pass, firm.  The pain felt better after the bowel movement. Only narcotic recently was fentanyl on 8/12. Normally she has a great appetite, no dysphagia, no heartburn, no abdominal pain.  Takes Protonix 40 mg/day.  Also on oral vitamin  B12.  Hgb 12.4 >> 10.  Was 13.5 in January 2021. MCV 88.  Platelets 138 - 172 INR 1.2.   t bili 1.4, normal alk phos and transaminases.   AKI.   TSH normal, slight elevation free T4 on 03/18/2020  CTAP w/o contrast: No RP hematoma, left groin stranding in region of femoral vessels but no hematoma.  Small, simple fluid in midportion of left femoral bypass graft, nonspecific, increased from 2016.  Colonic tortuosity, moderate stool burden, diverticulosis without diverticulitis.  Unremarkable liver, pancreas, biliary tree.   Family history of colon cancer in her brother.  Some sort of intestinal problems in her daughter.  Mother also had some sort of cancer which affected her bowel at age 25.     Past Medical History:  Diagnosis Date  . AMI 11/17/2008   Qualifier: Diagnosis of  By: Marland Mcalpine    . Anxiety   . Atherosclerotic peripheral vascular disease ([place])   . Bacteremia, escherichia coli 04/27/2015  . Brachial-basilar insufficiency syndrome   . CAD (coronary artery disease)    followed by dr [name].  . Celiac artery stenosis ([place])   . CHF (congestive heart failure) ([place]) 11/14/2017  . Chronic bilateral low  back pain without sciatica   . Cognitive impairment   . Colon polyps   . Community acquired pneumonia   . Depression   . Diverticulosis   . Dysuria   . Encephalomalacia   . GAD (generalized anxiety disorder)   . GERD (gastroesophageal reflux disease)   . Glaucoma   . Hearing loss   . Hemorrhoids   . Hypertension   . Hypothyroidism   . Incontinence   . Mixed hyperlipidemia   . Myocardial infarction ([place])   . OSA on CPAP   . Peripheral arterial disease ([place])   . Pneumonitis 04/26/2015  . Pyelonephritis   . Pyelonephritis due to Escherichia coli 04/28/2015  . Sepsis ([place])   . Sinus drainage    took z-pack   finished yesterday  . Spondylosis of lumbar region without myelopathy or radiculopathy   . 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[place])   . Urticaria   .  Vertigo, benign positional     Past Surgical History:  Procedure Laterality Date  . ABDOMINAL AORTOGRAM W/LOWER EXTREMITY N/A 08/22/2017   Procedure: ABDOMINAL AORTOGRAM W/LOWER EXTREMITY;  Surgeon: Serafina Mitchell, MD;  Location: [place] [facility];  Service: Cardiovascular;  Laterality: N/A;  . BILATERAL UPPER EXTREMITY ANGIOGRAM N/A 09/11/2012   Procedure: BILATERAL UPPER EXTREMITY ANGIOGRAM;  Surgeon: Serafina Mitchell, MD;  Location: [facility] CATH LAB;  Service: Cardiovascular;  Laterality: N/A;  . carotid duplex doppler Bilateral 09/03/2012, 11/03/2011   Evidence of 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 patent left common carotid- subclavian artery bypass with turbulent flow noted at the anastomosis with velocities of 295 cm/s  . CAROTID-SUBCLAVIAN BYPASS GRAFT  12/15/2011   Procedure: BYPASS GRAFT CAROTID-SUBCLAVIAN;  Surgeon: Serafina Mitchell, MD;  Location: [facility] OR;  Service: Vascular;  Laterality: Left;  Left Carotid subclavian bypass  . CORONARY ANGIOPLASTY    . CORONARY ARTERY BYPASS GRAFT    . CORONARY ATHERECTOMY N/A 03/19/2020   Procedure: CORONARY ATHERECTOMY;  Surgeon: Jettie Booze, MD;  Location: [place] [facility];  Service: Cardiovascular;  Laterality: N/A;  . CORONARY STENT INTERVENTION N/A 03/19/2020   Procedure: CORONARY STENT INTERVENTION;  Surgeon: Jettie Booze, MD;  Location: [place] [facility];  Service: Cardiovascular;  Laterality: N/A;  . DOPPLER ECHOCARDIOGRAPHY  05/27/2010, 09/17/2008   Mild Proximal septal thickening is noted. Left ventricular systolic functions is normal ejection fraction =>55%. the aortic valve appears to be mildly sclerotic   . fem-fem bypass graft  1999  . holter monitor  01/21/2008   The predominant rhythm was normal sinus rhythm. Minimum heartrate of 63 bpm at +01:00, maximum heartrate of 105 bpm at  + 10:35; and the average heartrate of 75 bpm. Ventricular ectopic activity totaled 1270: Multifocal; 866-PVC's and 404-VEs             . JOINT REPLACEMENT     Left knee  . LEFT HEART CATH AND CORS/GRAFTS ANGIOGRAPHY N/A 03/19/2020   Procedure: LEFT HEART CATH AND CORS/GRAFTS ANGIOGRAPHY;  Surgeon: Jettie Booze, MD;  Location: [place] [facility];  Service: Cardiovascular;  Laterality: N/A;  . LEFT HEART CATHETERIZATION WITH CORONARY/GRAFT ANGIOGRAM N/A 12/21/2011   Procedure: LEFT HEART CATHETERIZATION WITH Beatrix Fetters;  Surgeon: Leonie Man, MD;  Location: [facility] CATH LAB;  Service: Cardiovascular;  Laterality: N/A;  . NM MYOCAR PERF EJECTION FRACTION  09/22/2009, 07/03/2007   the post stress myocardial perfusion images  show a normal pattern of perfusion is all regions. The post-stress ejection fraction is 68 %. no significant wall motion abnormalities noted. This is a low risk scan.  Marland Kitchen PERIPHERAL VASCULAR INTERVENTION  08/22/2017   Procedure: PERIPHERAL VASCULAR INTERVENTION;  Surgeon: Serafina Mitchell, MD;  Location: [place] [facility];  Service: Cardiovascular;;  Fem/Fem Graft  . REPLACEMENT TOTAL KNEE  05-2011  . UNILATERAL UPPER EXTREMEITY ANGIOGRAM Left 11/15/2011   Procedure: UNILATERAL UPPER EXTREMEITY ANGIOGRAM;  Surgeon: Lorretta Harp, MD;  Location: [facility] CATH LAB;  Service: Cardiovascular;  Laterality: Left;    Prior to Admission medications   Medication Sig Start Date End Date Taking? Authorizing Provider  acetaminophen (TYLENOL) 325 MG tablet Take 325 mg by mouth in the morning and at bedtime. 1 by mouth in the morning and 1 by mouth in the pm.   Yes [provider]  ALPRAZolam (XANAX) 0.5 MG tablet Take 1 tablet (0.5 mg total) by mouth daily as needed for anxiety. 02/26/20  Yes Thayer Headings, PMHNP  amLODipine (NORVASC) 5 MG tablet TAKE 1 TABLET EVERY DAY Patient taking differently: Take 5 mg by mouth daily.  11/12/19  Yes Lauree Chandler, NP  aspirin EC  81 MG tablet Take 81 mg by mouth at bedtime.    Yes [provider]  brimonidine (ALPHAGAN) 0.2 % ophthalmic solution Place 1 drop into both eyes 2 (two) times daily.   Yes [provider]  calcium carbonate (TUMS - DOSED IN MG ELEMENTAL CALCIUM) 500 MG chewable tablet Chew 1 tablet by mouth daily as needed for indigestion or heartburn.   Yes [provider]  cholecalciferol (VITAMIN D) 400 units TABS tablet Take 400 Units by mouth daily.   Yes [provider]  clopidogrel (PLAVIX) 75 MG tablet TAKE 1 TABLET (75 MG TOTAL) BY MOUTH DAILY. 11/25/19  Yes Lorretta Harp, MD  divalproex (DEPAKOTE ER) 250 MG 24 hr tablet TAKE 1 TABLET(250 MG) BY MOUTH TWICE DAILY 02/26/20  Yes Thayer Headings, PMHNP  dorzolamide (TRUSOPT) 2 % ophthalmic solution Place 1 drop into both eyes 2 (two) times daily.   Yes [provider]  escitalopram (LEXAPRO) 20 MG tablet TAKE 1 TABLET(20 MG) BY MOUTH DAILY Patient taking differently: Take 20 mg by mouth daily.  02/26/20  Yes Thayer Headings, PMHNP  gabapentin (NEURONTIN) 100 MG capsule Take 1 capsule (100 mg total) by mouth 2 (two) times daily as needed. Patient taking differently: Take 200 mg by mouth 2 (two) times daily as needed (For nerve pain).  08/16/19  Yes Lauree Chandler, NP  lansoprazole (PREVACID) 15 MG capsule Take 15 mg by mouth daily as needed (For heartburn).   Yes [provider]  latanoprost (XALATAN) 0.005 % ophthalmic solution Place 1 drop into both eyes at bedtime.   Yes [provider]  levothyroxine (SYNTHROID) 100 MCG tablet Take 1 tablet (100 mcg total) by mouth daily before breakfast. To take with 88 mcg tablet to equal 188 mcg total 12/13/19  Yes Lauree Chandler, NP  levothyroxine (SYNTHROID) 88 MCG tablet Take 1 tablet (88 mcg total) by mouth daily before breakfast. To take with 100 mcg tablet to equal 188 mcg total 12/13/19  Yes Lauree Chandler, NP  meclizine (ANTIVERT) 25 MG tablet  Take 1 tablet (25 mg total) by mouth as needed for dizziness. 11/30/18  Yes Lauree Chandler, NP  metoprolol tartrate (LOPRESSOR) 25 MG tablet TAKE 1 TABLET TWICE DAILY Patient taking differently: Take 12.5 mg  by mouth 2 (two) times daily.  11/12/19  Yes Lauree Chandler, NP  mirabegron ER (MYRBETRIQ) 50 MG TB24 tablet Take 50 mg by mouth daily.   Yes [provider]  pantoprazole (PROTONIX) 40 MG tablet TAKE 1 TABLET EVERY DAY Patient taking differently: Take 40 mg by mouth daily.  11/25/19  Yes Lauree Chandler, NP  rosuvastatin (CRESTOR) 40 MG tablet Take 1 tablet (40 mg total) by mouth daily. 08/23/19  Yes Lauree Chandler, NP  tiZANidine (ZANAFLEX) 2 MG tablet Take 1 tablet (2 mg total) by mouth every 6 (six) hours as needed for muscle spasms. 07/01/19  Yes Ngetich, Dinah C, NP  valsartan (DIOVAN) 80 MG tablet TAKE 1 TABLET EVERY MORNING Patient taking differently: Take 80 mg by mouth daily.  11/12/19  Yes Lauree Chandler, NP  vitamin B-12 (CYANOCOBALAMIN) 1000 MCG tablet Take 1,000 mcg by mouth daily.   Yes [provider]  furosemide (LASIX) 40 MG tablet Take 1 tablet (40 mg total) by mouth daily as needed. Take 1 tablet if you have more than a 2 pound weight gain in 24 hours as needed. Patient not taking: Reported on 03/18/2020 11/16/17 11/30/38  Rama, Venetia Maxon, MD    Scheduled Meds: . aspirin EC  81 mg Oral Daily  . brimonidine  1 drop Both Eyes BID  . Chlorhexidine Gluconate Cloth  6 each Topical Daily  . clopidogrel  75 mg Oral Daily  . divalproex  250 mg Oral BID  . dorzolamide  1 drop Both Eyes BID  . escitalopram  20 mg Oral Daily  . latanoprost  1 drop Both Eyes QHS  . levothyroxine  188 mcg Oral Q0600  . magnesium hydroxide  15 mL Oral Daily  . metoprolol tartrate  12.5 mg Oral BID  . mirabegron ER  50 mg Oral Daily  . pantoprazole  40 mg Oral Daily  . rosuvastatin  40 mg Oral Daily  . sodium chloride flush  3 mL Intravenous Q12H  . sodium chloride  flush  3 mL Intravenous Q12H  . vitamin B-12  1,000 mcg Oral Daily   Infusions: . sodium chloride Stopped (03/22/20 0627)  . sodium chloride    . sodium chloride    . sodium chloride     PRN Meds: sodium chloride, Place/Maintain arterial line **AND** sodium chloride, acetaminophen, acetaminophen, albuterol, ALPRAZolam, gabapentin, nitroGLYCERIN, ondansetron (ZOFRAN) IV, sodium chloride flush, tiZANidine   Allergies as of 03/18/2020 - Review Complete 03/18/2020  Allergen Reaction Noted  . Donepezil Other (See Comments) 11/14/2017  . Duloxetine hcl  11/30/2018  . Hydrocodone-acetaminophen Other (See Comments) 06/01/2015  . Meloxicam  11/27/2017  . Memantine Other (See Comments) 11/14/2017  . Oxycodone Other (See Comments) 12/18/2015  . Vicodin [hydrocodone-acetaminophen] Other (See Comments) 11/28/2011    Family History  Problem Relation Age of Onset  . Heart attack Mother   . Heart disease Mother        before age 9  . Diabetes Father   . Heart disease Father   . Hypertension Father   . Hyperlipidemia Father   . Heart attack Father 17  . Heart attack Brother 45  . Cerebral palsy Sister 70  . Congestive Heart Failure Sister 79  . Heart attack Sister 53  . Hypertension Sister 41  . Dementia Sister   . Anxiety disorder Sister   . Anxiety disorder Sister 32  . Heart Problems Sister   . Stroke Sister   . Heart Problems Sister 41  .  Heart attack Sister 39  . Colon cancer Brother 77  . Prostate cancer Brother 88  . Hypertension Daughter   . Irritable bowel syndrome Daughter   . Depression Daughter   . Anxiety disorder Daughter     Social History   Socioeconomic History  . Marital status: Divorced    Spouse name: Not on file  . Number of children: Not on file  . Years of education: Not on file  . Highest education level: Not on file  Occupational History  . Not on file  Tobacco Use  . Smoking status: Former Smoker    Years: 3.00    Types: Cigarettes    Quit  date: 11/27/1981    Years since quitting: 38.3  . Smokeless tobacco: Never Used  Vaping Use  . Vaping Use: Never used  Substance and Sexual Activity  . Alcohol use: Not Currently    Alcohol/week: 0.0 standard drinks  . Drug use: No  . Sexual activity: Not Currently    Comment: 1st intercourse 40 yo-1 partner  Other Topics Concern  . Not on file  Social History Narrative   Social History      Diet? Healthy but too many sweets      Do you drink/eat things with caffeine? Yes occasionally      Marital status?                    D                What year were you married?      Do you live in a house, apartment, assisted living, condo, trailer, etc.? condo      Is it one or more stories? 1      How many persons live in your home? 2      Do you have any pets in your home? (please list) 1 cat      Highest level of education completed? 1 year college      Current or past profession: housewife      Do you exercise?           no                           Type & how often?      Advanced Directives      Do you have a living will? no      Do you have a DNR form?             no                     If not, do you want to discuss one? yes      Do you have signed POA/HPOA for forms? no      Functional Status      Do you have difficulty bathing or dressing yourself? No- gets tired      Do you have difficulty preparing food or eating? No- eats frozen entrees or poor nutrition meals      Do you have difficulty managing your medications? yes      Do you have difficulty managing your finances? yes      Do you have difficulty affording your medications? No- funds running low   Social Determinants of Health   Financial Resource Strain:   . Difficulty of Paying Living Expenses:   Food Insecurity:   . Worried About Estate manager/land agent  of Food in the Last Year:   . [place] in the Last Year:   Transportation Needs:   . Lack of Transportation (Medical):   Marland Kitchen Lack of Transportation  (Non-Medical):   Physical Activity:   . Days of Exercise per Week:   . Minutes of Exercise per Session:   Stress:   . Feeling of Stress :   Social Connections:   . Frequency of Communication with Friends and Family:   . Frequency of Social Gatherings with Friends and Family:   . Attends Religious Services:   . Active Member of Clubs or Organizations:   . Attends Archivist Meetings:   Marland Kitchen Marital Status:   Intimate Partner Violence:   . Fear of Current or Ex-Partner:   . Emotionally Abused:   Marland Kitchen Physically Abused:   . Sexually Abused:     REVIEW OF SYSTEMS: Constitutional: Generally has no issues with weakness or fatigue but that was one of the symptoms she had in the immediate.  Prior to arrival ENT:  No nose bleeds Pulm: Shortness of breath prior to arrival, now resolved. CV:  No palpitations, no LE edema.  Chest pressure/pain prior to arrival now resolved GU:  No hematuria, no frequency GI: See HPI Heme: Denies unusual or excessive bleeding or bruising Transfusions: Records indicate she received 1 unit PRBCs in 2012 Neuro:  No headaches, no peripheral tingling or numbness Derm:  No itching, no rash or sores.  Endocrine:  No sweats or chills.  No polyuria or dysuria Immunization: Not queried. Travel:  None beyond local counties in last few months.    PHYSICAL EXAM: Vital signs in last 24 hours: Vitals:   03/23/20 0400 03/23/20 0803  BP: (!) 109/56 (!) 146/67  Pulse: 83 89  Resp: 18 18  Temp: 98 F (36.7 C) 97.8 F (36.6 C)  SpO2: 92% 97%   Wt Readings from Last 3 Encounters:  03/23/20 97.8 kg  12/13/19 98 kg  08/16/19 95.3 kg    General: Obese, pleasant, comfortable elderly WF.  Looks chronically ill. Head: No facial asymmetry or swelling.  No signs of head trauma. Eyes: No conjunctival pallor, no scleral icterus.  EOMI Ears: Slight HOH Nose: No congestion or discharge Mouth: Good dentition with some missing teeth.  Tongue midline.  Mucosa slightly  dry. Neck: No JVD, no masses, no thyromegaly Lungs: Clear bilaterally, decreased breath sounds in the bases.  No labored breathing, no cough Heart: RRR.  NSR in 70s on telemetry monitor.  Slight systolic murmur.  S1, S2 audible Abdomen: Soft, obese.  Not tender.  Not distended.  Bowel sounds normal quality but hypoactive.   Rectal: Deferred Musc/Skeltl: No joint redness, swelling or gross deformities. Extremities: No CCE. Neurologic: Alert.  Slightly hard of hearing.  No tremors or gross weaknesses.  Moves all 4 limbs, strength not tested.  Fluid speech. Skin: No sores, no suspicious lesions, no telangiectasia. Tattoos: None observed Nodes: No cervical adenopathy Psych: Calm, pleasant, cooperative.  Fluid speech.  Intake/Output from previous day: 08/15 0701 - 08/16 0700 In: 480 [P.O.:480] Out: 950 [Urine:950] Intake/Output this shift: No intake/output data recorded.  LAB RESULTS: Recent Labs    03/21/20 2124 03/22/20 0440 03/23/20 0633  WBC 7.7 10.3 6.0  HGB 10.7* 11.3* 10.0*  HCT 33.3* 35.6* 31.1*  PLT 138* 186 156   BMET Lab Results  Component Value Date   NA 137 03/23/2020   NA 137 03/22/2020   NA 138 03/21/2020   K  3.5 03/23/2020   K 3.7 03/22/2020   K 3.7 03/21/2020   CL 98 03/23/2020   CL 96 (L) 03/22/2020   CL 95 (L) 03/21/2020   CO2 29 03/23/2020   CO2 26 03/22/2020   CO2 29 03/21/2020   GLUCOSE 135 (H) 03/23/2020   GLUCOSE 149 (H) 03/22/2020   GLUCOSE 149 (H) 03/21/2020   BUN 24 (H) 03/23/2020   BUN 25 (H) 03/22/2020   BUN 23 03/21/2020   CREATININE 1.59 (H) 03/23/2020   CREATININE 1.57 (H) 03/22/2020   CREATININE 1.81 (H) 03/21/2020   CALCIUM 8.7 (L) 03/23/2020   CALCIUM 8.7 (L) 03/22/2020   CALCIUM 8.9 03/21/2020   LFT No results for input(s): PROT, ALBUMIN, AST, ALT, ALKPHOS, BILITOT, BILIDIR, IBILI in the last 72 hours. PT/INR Lab Results  Component Value Date   INR 1.2 03/19/2020   INR 1.27 04/26/2015   INR 1.07 12/19/2011   Hepatitis  Panel No results for input(s): HEPBSAG, HCVAB, HEPAIGM, HEPBIGM in the last 72 hours. C-Diff No components found for: CDIFF Lipase  No results found for: LIPASE  Drugs of Abuse  No results found for: LABOPIA, COCAINSCRNUR, LABBENZ, AMPHETMU, THCU, LABBARB   RADIOLOGY STUDIES: CT ABDOMEN PELVIS WO CONTRAST  Result Date: 03/21/2020 CLINICAL DATA:  Retroperitoneal hematoma suspected. Cardiac catheterization 2 days ago. EXAM: CT ABDOMEN AND PELVIS WITHOUT CONTRAST TECHNIQUE: Multidetector CT imaging of the abdomen and pelvis was performed following the standard protocol without IV contrast. COMPARISON:  Chest radiograph 03/19/2020.  Abdominal CT 04/27/2015 FINDINGS: Lower chest: Small bilateral pleural effusions and basilar atelectasis. Mild basilar septal thickening suspicious for pulmonary edema. Coronary artery calcifications/stents. Mild cardiomegaly. Hepatobiliary: No evidence of focal hepatic abnormality on noncontrast exam. Unremarkable gallbladder. No biliary dilatation. Pancreas: No ductal dilatation or inflammation. Spleen: Normal in size. No definite focal abnormality on noncontrast exam. Adrenals/Urinary Tract: No adrenal nodule or evidence of hemorrhage. Mild symmetric perinephric edema is similar to that seen on prior exam. There is no hydronephrosis. There is a 3.6 cm simple cyst in the lower right kidney. No renal calculi. Urinary bladder is partially distended. No bladder wall thickening. Stomach/Bowel: Tiny hiatal hernia. Ingested material within the stomach. There is no small bowel obstruction or inflammatory change. Appendix tentatively visualized and normal. Colonic tortuosity with moderate colonic stool burden. Mild diverticulosis involving the distal descending and sigmoid colon. No diverticulitis. Stool in the rectum. Vascular/Lymphatic: Dense aortic atherosclerosis. No aortic aneurysm. There is mild peripheral branch atherosclerosis. Femoral femoral bypass graft. Small amount of  simple fluid adjacent to the midportion of the graft. There is stranding adjacent to the left femoral vessels but no confluent hematoma. There is no retroperitoneal stranding extending into the abdominopelvic cavity. Reproductive: Heterogeneous uterus with calcification along the anterior body on the left, unchanged. Ovaries appear quiescent. No suspicious adnexal mass. Other: No fluid or retroperitoneal stranding to suggest retroperitoneal hematoma. No stranding of the abdominal wall musculature. There is no intra-abdominal ascites. No free air. Tiny fat containing umbilical hernia. Scattered peripherally calcified densities in the subcutaneous tissues likely granulomas. Musculoskeletal: Degenerative change involving the spine and both hips. There are no acute or suspicious osseous abnormalities. IMPRESSION: 1. No evidence of retroperitoneal hematoma. Mild stranding in the left groin adjacent to the femoral vessels but no confluent hematoma. There is no retroperitoneal stranding extending into the abdominopelvic cavity. 2. Small amount of simple fluid adjacent to the midportion of the left femoral bypass graft, nonspecific, increased from 2016 but simple fluid density. 3. Small bilateral pleural effusions  and atelectasis. Basilar septal thickening suspicious for pulmonary edema. 4. Colonic tortuosity with moderate colonic stool burden, can be seen with constipation. Colonic diverticulosis without diverticulitis. Aortic Atherosclerosis (ICD10-I70.0). Electronically Signed   By: Keith Rake M.D.   On: 03/21/2020 22:26   [place] EKG SITE RITE  Result Date: 03/21/2020 If Site Rite image not attached, placement could not be confirmed due to current cardiac rhythm.    IMPRESSION:   *   Anemia, normocytic.  *   Left abd pain.   Sounds like it may be related to constipation.   Noncontrast CTAP reassuring.  Another possibility is ischemic insult to colon not detected on non-contrast study but normally ischemia  is a/w bloody stools/diarrhea.    *   NSTEMI.  Previous CABG.  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   On Brlinita, Plavix, ASA  *   AKI.      PLAN:     *   Repeat LFTs and Lipase pndg.  *   Miralax bid x 4 doses, dulcolax 10 mg once.  Already on med list, added today, MOM 15 mL daily  *   Continue Protonix 40 mg po daily.     Azucena Freed  03/23/2020, 11:16 AM Phone [phone]

## 2020-03-24 ENCOUNTER — Inpatient Hospital Stay (HOSPITAL_COMMUNITY): Payer: Medicare HMO

## 2020-03-24 DIAGNOSIS — R1012 Left upper quadrant pain: Secondary | ICD-10-CM | POA: Diagnosis not present

## 2020-03-24 DIAGNOSIS — K5900 Constipation, unspecified: Secondary | ICD-10-CM | POA: Diagnosis not present

## 2020-03-24 DIAGNOSIS — E782 Mixed hyperlipidemia: Secondary | ICD-10-CM | POA: Diagnosis not present

## 2020-03-24 DIAGNOSIS — I25708 Atherosclerosis of coronary artery bypass graft(s), unspecified, with other forms of angina pectoris: Secondary | ICD-10-CM | POA: Diagnosis not present

## 2020-03-24 DIAGNOSIS — I10 Essential (primary) hypertension: Secondary | ICD-10-CM | POA: Diagnosis not present

## 2020-03-24 DIAGNOSIS — I214 Non-ST elevation (NSTEMI) myocardial infarction: Secondary | ICD-10-CM | POA: Diagnosis not present

## 2020-03-24 LAB — CBC
HCT: 32.4 % — ABNORMAL LOW (ref 36.0–46.0)
Hemoglobin: 10 g/dL — ABNORMAL LOW (ref 12.0–15.0)
MCH: 27.4 pg (ref 26.0–34.0)
MCHC: 30.9 g/dL (ref 30.0–36.0)
MCV: 88.8 fL (ref 80.0–100.0)
Platelets: 162 10*3/uL (ref 150–400)
RBC: 3.65 MIL/uL — ABNORMAL LOW (ref 3.87–5.11)
RDW: 13.3 % (ref 11.5–15.5)
WBC: 6.2 10*3/uL (ref 4.0–10.5)
nRBC: 0 % (ref 0.0–0.2)

## 2020-03-24 LAB — BASIC METABOLIC PANEL
Anion gap: 8 (ref 5–15)
BUN: 22 mg/dL (ref 8–23)
CO2: 31 mmol/L (ref 22–32)
Calcium: 9.2 mg/dL (ref 8.9–10.3)
Chloride: 98 mmol/L (ref 98–111)
Creatinine, Ser: 1.32 mg/dL — ABNORMAL HIGH (ref 0.44–1.00)
GFR calc Af Amer: 45 mL/min — ABNORMAL LOW (ref >60)
GFR calc non Af Amer: 39 mL/min — ABNORMAL LOW (ref >60)
Glucose, Bld: 119 mg/dL — ABNORMAL HIGH (ref 70–99)
Potassium: 4 mmol/L (ref 3.5–5.1)
Sodium: 137 mmol/L (ref 135–145)

## 2020-03-24 NOTE — Evaluation (Signed)
Occupational Therapy Evaluation Patient Details Name: Deanna [name] MRN: [id number] DOB: Apr 03, 1941 Today's Date: 03/24/2020    History of Present Illness Pt is a 79 y/o female admitted secondary to SOB and chest pain. Found to have NSTEMI and is s/p heart cath with arthrectomy. Pt also with respiratory failure during admission and requiring supplemental oxygen. PMH includes HTN, CHF, a fib, CAD, and CKD.    Clinical Impression   PTA, pt was living with her daughter and performed BADLs; recently daughter has been assisting with LB ADLs and pt has been using rollator. Pt currently requiring Min A for UB ADLs, Mod A for LB ADLs, and Min Guard A for functional mobility with RW. Pt presenting with decreased activity tolerance as seen by fatigue and SOB. Pt would benefit from further acute OT to facilitate safe dc. Recommend dc to home with HHOT for further OT to optimize safety, independence with ADLs, and return to PLOF.     Follow Up Recommendations  Home health OT;Supervision/Assistance - 24 hour (Pending pt progress)    Equipment Recommendations  Tub/shower seat;Other (comment) (Need to confirm if pt has shower chair at home)    Recommendations for Other Services PT consult     Precautions / Restrictions Precautions Precautions: Fall Precaution Comments: watch O2 Restrictions Weight Bearing Restrictions: No      Mobility Bed Mobility Overal bed mobility: Needs Assistance Bed Mobility: Supine to Sit     Supine to sit: Min assist     General bed mobility comments: Min A for pt to hold OT's hand and pull into upright posture  Transfers Overall transfer level: Needs assistance Equipment used: Rolling walker (2 wheeled) Transfers: Sit to/from Stand Sit to Stand: Min guard         General transfer comment: Min guard for safety and steadying. Cues for safe hand placement.     Balance Overall balance assessment: Needs assistance Sitting-balance support: No upper  extremity supported Sitting balance-Leahy Scale: Fair     Standing balance support: Bilateral upper extremity supported;During functional activity Standing balance-Leahy Scale: Poor Standing balance comment: Reliant on BUE support                            ADL either performed or assessed with clinical judgement   ADL Overall ADL's : Needs assistance/impaired Eating/Feeding: Set up;Sitting   Grooming: Set up;Sitting   Upper Body Bathing: Minimal assistance;Sitting   Lower Body Bathing: Moderate assistance;Sit to/from stand   Upper Body Dressing : Minimal assistance;Sitting   Lower Body Dressing: Moderate assistance;Sit to/from stand Lower Body Dressing Details (indicate cue type and reason): Difficulty bending forward due to abdominal pain. Unable to adjust socks.  Toilet Transfer: Min guard;RW;Ambulation (simulated to Psychologist, occupational Details (indicate cue type and reason): MIn GUard A for safety         Functional mobility during ADLs: Min guard;Rolling walker General ADL Comments: Pt demonstrating decreased acitvity tolerance as seen by fatigue and shortness of breath. Pt also limited by abdomenal pain when performing bed mobility and LB ADLs.      Vision Baseline Vision/History: Wears glasses Wears Glasses: At all times Patient Visual Report: No change from baseline       Perception     Praxis      Pertinent Vitals/Pain Pain Assessment: Faces Faces Pain Scale: Hurts even more Pain Location: L groinand lower abdomen Pain Descriptors / Indicators: Grimacing;Guarding Pain Intervention(s): Monitored during session;Limited activity within  patient's tolerance;Repositioned     Hand Dominance Right   Extremity/Trunk Assessment Upper Extremity Assessment Upper Extremity Assessment: LUE deficits/detail LUE Deficits / Details: Noting resting tremors at LUE. PT reports this has been going on for a little while and her doctor is aware   Lower  Extremity Assessment Lower Extremity Assessment: Defer to PT evaluation   Cervical / Trunk Assessment Cervical / Trunk Assessment: Other exceptions Cervical / Trunk Exceptions: Pain at abdomen making it difficult to flex/extend at torsoe.   Communication Communication Communication: No difficulties   Cognition Arousal/Alertness: Awake/alert Behavior During Therapy: WFL for tasks assessed/performed Overall Cognitive Status: No family/caregiver present to determine baseline cognitive functioning Area of Impairment: Following commands;Problem solving;Orientation                 Orientation Level: Disoriented to;Time (Stating it is August but then stating year 2001)     Following Commands: Follows one step commands with increased time;Follows multi-step commands with increased time     Problem Solving: Slow processing;Requires verbal cues General Comments: Pt very pleasant and willing to participate in therapy. Requiring increased time and cues during session. Answering basic questions and following simple commands. HOH making it difficulty for her to follow conversation at times. Disoriented to year only.    General Comments  HR 80s. SpO2 90s on 5L; however, dropping to 70s with poor wave form - pt does not report difficulty breathing.     Exercises     Shoulder Instructions      Home Living Family/patient expects to be discharged to:: Private residence Living Arrangements: Children Available Help at Discharge: Family Type of Home: House Home Access: Level entry     Home Layout: One level     Bathroom Shower/Tub: Tub/shower unit (Renovating to put in a walk in shower)   Miranda: Environmental consultant - 4 wheels          Prior Functioning/Environment Level of Independence: Needs assistance  Gait / Transfers Assistance Needed: Reports occasionally using a rollator.  ADL's / Homemaking Assistance Needed: Daughter assisting with  LB ADLs since recent pain at abdomen. Also supervises in/out of tub   Comments: Daughter completes IADLs; however, pt does simple IADLs and short distance driving        OT Problem List: Decreased strength;Decreased range of motion;Impaired balance (sitting and/or standing);Decreased activity tolerance;Decreased knowledge of use of DME or AE;Decreased knowledge of precautions;Decreased cognition;Pain      OT Treatment/Interventions: Self-care/ADL training;Therapeutic exercise;Energy conservation;DME and/or AE instruction;Therapeutic activities;Patient/family education    OT Goals(Current goals can be found in the care plan section) Acute Rehab OT Goals Patient Stated Goal: to feel better OT Goal Formulation: With patient Time For Goal Achievement: 04/07/20 Potential to Achieve Goals: Good  OT Frequency: Min 2X/week   Barriers to D/C:            Co-evaluation              AM-PAC OT "6 Clicks" Daily Activity     Outcome Measure Help from another person eating meals?: None Help from another person taking care of personal grooming?: A Little Help from another person toileting, which includes using toliet, bedpan, or urinal?: A Little Help from another person bathing (including washing, rinsing, drying)?: A Lot Help from another person to put on and taking off regular upper body clothing?: A Little Help from another person to put on and taking off regular lower body clothing?: A Lot  6 Click Score: 17   End of Session Equipment Utilized During Treatment: Oxygen;Rolling walker (5L) Nurse Communication: Mobility status  Activity Tolerance: Patient tolerated treatment well Patient left: in chair;with call bell/phone within reach;with chair alarm set  OT Visit Diagnosis: Unsteadiness on feet (R26.81);Other abnormalities of gait and mobility (R26.89);Muscle weakness (generalized) (M62.81);Pain Pain - Right/Left: Left Pain - part of body:  (Abdomen)                Time:  6269-4854 OT Time Calculation (min): 24 min Charges:  OT General Charges $OT Visit: 1 Visit OT Evaluation $OT Eval Moderate Complexity: 1 Mod OT Treatments $Self Care/Home Management : 8-22 mins  Nnenna Meador MSOT, OTR/L Acute Rehab Pager: [phone] Office: [place] 03/24/2020, 8:33 AM

## 2020-03-24 NOTE — Anesthesia Preprocedure Evaluation (Addendum)
Anesthesia Evaluation  Patient identified by MRN, date of birth, ID band Patient awake    Reviewed: Allergy & Precautions, NPO status , Patient's Chart, lab work & pertinent test results  Airway Mallampati: II  TM Distance: >3 FB Neck ROM: Full    Dental  (+) Teeth Intact, Poor Dentition   Pulmonary sleep apnea and Continuous Positive Airway Pressure Ventilation , former smoker,    Pulmonary exam normal breath sounds clear to auscultation       Cardiovascular hypertension, Pt. on medications + angina + CAD, + Past MI, + Peripheral Vascular Disease (PAD with right fem-fem bypass and left subclavian stenosis s/p LSCA to left carotid bypass) and +CHF  Normal cardiovascular exam+ dysrhythmias Atrial Fibrillation  Rhythm:Regular Rate:Normal  Left Heart Catheterization 03/19/2020:  Ost LAD to Prox LAD lesion is 99% stenosed. LIMA to LAD known to be atretic.  After orbital atherectomy, A drug-eluting stent was successfully placed using a STENT RESOLUTE ONYX 3.5X15.  Post intervention, there is a 0% residual stenosis.  Mid LAD lesion is 100% stenosed. Despite atherectomy and angioplasty, flow could not be restored.  Mid Cx lesion is 100% stenosed. SVG to OM patent.  Prox RCA lesion is 100% stenosed. SVG to PDA occluded.  There is moderate left ventricular systolic dysfunction.  LV end diastolic pressure is moderately elevated.  The left ventricular ejection fraction is 25-35% by visual estimate.  There is no aortic valve stenosis.  Balloon angioplasty was performed using a BALLOON SAPPHIRE 2.0X12.  Echocardiogram 03/19/2020: Impressions: 1. Left ventricular ejection fraction, by estimation, is 35 to 40%. The  left ventricle has moderately decreased function. The left ventricle  demonstrates regional wall motion abnormalities (see scoring  diagram/findings for description). Left ventricular  diastolic parameters are consistent  with Grade II diastolic dysfunction  (pseudonormalization). There is severe akinesis of the left ventricular,  mid-apical anteroseptal wall.  2. Right ventricular systolic function is mildly reduced. The right  ventricular size is normal.  3. The mitral valve is grossly normal. Mild mitral valve regurgitation.  4. The aortic valve is normal in structure. Aortic valve regurgitation is  not visualized. No aortic stenosis is present.    Neuro/Psych PSYCHIATRIC DISORDERS Anxiety Depression TBI    GI/Hepatic Neg liver ROS, GERD  Medicated,  Endo/Other  Hypothyroidism Obesity   Renal/GU negative Renal ROS     Musculoskeletal  (+) Arthritis ,   Abdominal   Peds  Hematology  (+) Blood dyscrasia (Plavix), anemia ,   Anesthesia Other Findings   Reproductive/Obstetrics                           Anesthesia Physical Anesthesia Plan  ASA: IV  Anesthesia Plan: MAC   Post-op Pain Management:    Induction: Intravenous  PONV Risk Score and Plan: 2 and Propofol infusion and Treatment may vary due to age or medical condition  Airway Management Planned: Nasal Cannula and Natural Airway  Additional Equipment:   Intra-op Plan:   Post-operative Plan:   Informed Consent: I have reviewed the patients History and Physical, chart, labs and discussed the procedure including the risks, benefits and alternatives for the proposed anesthesia with the patient or authorized representative who has indicated his/her understanding and acceptance.     Dental advisory given  Plan Discussed with: CRNA  Anesthesia Plan Comments:        Anesthesia Quick Evaluation

## 2020-03-24 NOTE — Progress Notes (Signed)
[id number] Came to see pt to get OOB.. Pt had been OOB earlier and tired now. Wanted to eat lunch but low in bed. We pulled pt up in bed and set up tray. Opened containers and encouraged her to eat. Will continue to follow. Graylon Good RN BSN 03/24/2020 2:56 PM

## 2020-03-24 NOTE — Progress Notes (Addendum)
Progress Note  Patient Name: Deanna [name] Date of Encounter: 03/24/2020  [facility] Cardiologist: Quay Burow, MD   Subjective   No acute overnight events. No chest pain. Breathing OK. She has some left upper quadrant pain but states it is better than yesterday. No bowel movements yesterday.  Inpatient Medications    Scheduled Meds: . aspirin EC  81 mg Oral Daily  . brimonidine  1 drop Both Eyes BID  . Chlorhexidine Gluconate Cloth  6 each Topical Daily  . clopidogrel  75 mg Oral Daily  . divalproex  250 mg Oral BID  . dorzolamide  1 drop Both Eyes BID  . escitalopram  20 mg Oral Daily  . latanoprost  1 drop Both Eyes QHS  . levothyroxine  188 mcg Oral Q0600  . magnesium hydroxide  15 mL Oral Daily  . metoprolol tartrate  12.5 mg Oral BID  . mirabegron ER  50 mg Oral Daily  . pantoprazole  40 mg Oral Daily  . polyethylene glycol  17 g Oral BID  . rosuvastatin  40 mg Oral Daily  . sodium chloride flush  3 mL Intravenous Q12H  . sodium chloride flush  3 mL Intravenous Q12H  . vitamin B-12  1,000 mcg Oral Daily   Continuous Infusions: . sodium chloride Stopped (03/22/20 0627)  . sodium chloride    . sodium chloride    . sodium chloride     PRN Meds: sodium chloride, Place/Maintain arterial line **AND** sodium chloride, acetaminophen, acetaminophen, albuterol, ALPRAZolam, gabapentin, nitroGLYCERIN, ondansetron (ZOFRAN) IV, sodium chloride flush, tiZANidine   Vital Signs    Vitals:   03/23/20 1631 03/23/20 2045 03/24/20 0005 03/24/20 0436  BP: 109/60 (!) 102/59 104/65 (!) 109/55  Pulse: 86 66 85 79  Resp: '18 18 18 16  ' Temp: 97.9 F (36.6 C) 99.1 F (37.3 C) 98.6 F (37 C) 97.9 F (36.6 C)  TempSrc: Oral Oral Oral Oral  SpO2: 96% 98% 100% 100%  Weight:      Height:        Intake/Output Summary (Last 24 hours) at 03/24/2020 0725 Last data filed at 03/24/2020 0450 Gross per 24 hour  Intake 360 ml  Output 500 ml  Net -140 ml   Last 3 Weights  03/23/2020 03/21/2020 03/20/2020  Weight (lbs) 215 lb 9.8 oz 207 lb 7.3 oz 209 lb 3.5 oz  Weight (kg) 97.8 kg 94.1 kg 94.9 kg  Some encounter information is confidential and restricted. Go to Review Flowsheets activity to see all data.      Telemetry    Normal sinus rhythm with rates in the 80's. - Personally Reviewed  ECG    No new ECG tracing today. - Personally Reviewed  Physical Exam   GEN: No acute distress.   Neck: Supple. No JVD Cardiac: RRR. No murmurs, rubs, or gallops.  Respiratory: Clear to auscultation bilaterally. GI: Soft, non-distended, and tender to palpation of left upper quadrant. Minimal bowel sounds. MS: No lower extremity edema. No deformity. Neuro:  No focal deficits. Psych: Normal affect. Responds appropriately.  Labs    High Sensitivity Troponin:   Recent Labs  Lab 03/18/20 1537 03/18/20 1737 03/18/20 2248 03/19/20 0502  TROPONINIHS 3,720* 4,011* 3,425* 3,370*      Chemistry Recent Labs  Lab 03/18/20 1918 03/19/20 0343 03/21/20 0345 03/22/20 0440 03/23/20 0633 03/23/20 1155  NA  --    < > 138 137 137  --   K  --    < >  3.7 3.7 3.5  --   CL  --    < > 95* 96* 98  --   CO2  --    < > '29 26 29  ' --   GLUCOSE  --    < > 149* 149* 135*  --   BUN  --    < > 23 25* 24*  --   CREATININE  --    < > 1.81* 1.57* 1.59*  --   CALCIUM  --    < > 8.9 8.7* 8.7*  --   PROT 5.9*  --   --   --   --  5.4*  ALBUMIN 3.5  --   --   --   --  2.6*  AST 28  --   --   --   --  15  ALT 9  --   --   --   --  8  ALKPHOS 50  --   --   --   --  47  BILITOT 1.4*  --   --   --   --  0.9  GFRNONAA  --    < > 26* 31* 31*  --   GFRAA  --    < > 30* 36* 36*  --   ANIONGAP  --    < > '14 15 10  ' --    < > = values in this interval not displayed.     Hematology Recent Labs  Lab 03/22/20 0440 03/23/20 0633 03/24/20 0647  WBC 10.3 6.0 6.2  RBC 4.12 3.52* 3.65*  HGB 11.3* 10.0* 10.0*  HCT 35.6* 31.1* 32.4*  MCV 86.4 88.4 88.8  MCH 27.4 28.4 27.4  MCHC 31.7 32.2  30.9  RDW 13.2 13.4 13.3  PLT 186 156 162    BNP Recent Labs  Lab 03/18/20 1918 03/21/20 0345  BNP 912.2* 656.6*     DDimer No results for input(s): DDIMER in the last 168 hours.   Radiology    No results found.  Cardiac Studies   Left Heart Catheterization 03/19/2020:  Ost LAD to Prox LAD lesion is 99% stenosed. LIMA to LAD known to be atretic.  After orbital atherectomy, A drug-eluting stent was successfully placed using a STENT RESOLUTE ONYX 3.5X15.  Post intervention, there is a 0% residual stenosis.  Mid LAD lesion is 100% stenosed. Despite atherectomy and angioplasty, flow could not be restored.  Mid Cx lesion is 100% stenosed. SVG to OM patent.  Prox RCA lesion is 100% stenosed. SVG to PDA occluded.  There is moderate left ventricular systolic dysfunction.  LV end diastolic pressure is moderately elevated.  The left ventricular ejection fraction is 25-35% by visual estimate.  There is no aortic valve stenosis.  Balloon angioplasty was performed using a BALLOON SAPPHIRE 2.0X12.  Complex intervention due to severe three-vessel coronary artery disease with prior bypass surgery. Successful atherectomy and stenting of the proximal LAD. Unable to restore flow through the mid LAD. I suspect, she is a somewhat late presenting infarct. She already has evidence of heart failure with elevated LVEDP. Will need ongoing medical therapy to help her LV dysfunction. _______________  Echocardiogram 03/19/2020: Impressions: 1. Left ventricular ejection fraction, by estimation, is 35 to 40%. The  left ventricle has moderately decreased function. The left ventricle  demonstrates regional wall motion abnormalities (see scoring  diagram/findings for description). Left ventricular  diastolic parameters are consistent with Grade II diastolic dysfunction  (pseudonormalization). There is severe akinesis of the  left ventricular,  mid-apical anteroseptal wall.  2. Right  ventricular systolic function is mildly reduced. The right  ventricular size is normal.  3. The mitral valve is grossly normal. Mild mitral valve regurgitation.  4. The aortic valve is normal in structure. Aortic valve regurgitation is  not visualized. No aortic stenosis is present.   Patient Profile     79 y.o. 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  Assessment & Plan    NSTEMI with Known CAD - High-sensitivity troponin peaked at 4,011.  - Echo showed LVEF of 35-40% with severe akinesis of mid-apical anteroseptal wall and grade 2 diastolic dysfunction.  - LHC showed 99% stenosis of ostial to proximal LAD followed by 100% stenosis of mid LAD with known atretic LIMA to LAD. S/p successful atherectomy and DES to proximal LAD but unable to restore flow through mid LAD. Felt to be somewhat of late presenting infarct. - No angina. - Continue DAPT with Aspirin and Plavix.  - Continue low dose beta-blocker and high-intensity statin.   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 - LVEF of 35-40% as above. - Received a few doses of IV Lasix. Last dose on 8/12. Net negative 359 mL this admission. Weight up 5lbs from admission.  - Does not appear significantly volume overloaded on exam. - Lopressor 12.67m twice daily started yesterday. - BP soft but stable so will hold on restarting Irbesartan at this time. - Continue to monitor daily weights, strict I/O's, and renal function. - She is still on 5L of supplemental O2. Suspect she will need to go home on O2.  Hypotension - Had hypotension on 03/21/2020 briefly requiring pressors (Levophed and Dopamine). Lactic acid, procalcitonin, and blood cultures normal so far. BP cuff switched to leg given history of subclavian stenosis  and BP has been low normal.  - Lopressor 12.554mtwice daily restarted yesterday.  Left Upper Quadrant Pain - Patient continues to complain of left upper quadrant pain - she states it feels like a "gas pocket." Worse after having a bowel movement and after eating. She is tender to palpation on exam. - CT on 8/14 negative for retroperitoneal hematoma. Did show colonic tortuosity with moderate colonic stool burden. - Total, Indirect, and Direct Bili slightly elevated on 03/18/2020 but normal on repeat labs on 03/23/2020. AST, ALT, Alk Phos normal as well.   - Lipase normal. Amylase low. - Hemoccult ordered but not collected. - GI consulted - may be due to constipation. They are considering diagnostic endoscopy based on how patient does today with trying to optimize bowel. Received one dose of Dulcolax 73m43mesterday. Continue Miralax 2 times daily and Milk of Magnesia 15 mL daily. Further recommendations per GI. - Minimal bowel sounds noted on exam today so will check KUB.  PAD  - History of left to right fem-fem bypass and left subclavian to left common carotid bypass for left subclavian artery stenosis. Also history of right femoral pseudoaneurysm repair. - Continue DAPT and statin.  Atrial Fibrillation - Brief episode of atrial fibrillation this admission. Started on Amiodarone but this has been discontinued.  - Maintaining sinus rhythm. - Was not placed on anticoagulation as she is on DAPT and this was a very limited episode. - Continue to monitor.  Hyperlipidemia - Lipid panel this  admission: Total Cholesterol 90, Triglycerides 172, HDL 26, LDL 30. - At LDL goal <70 given CAD. - Started on Crestor 29m daily. - Will need repeat lipid panel and LFTs in 6-8 weeks.  Anemia - Hemoglobin stable at 10.0 today. Down from 12.4 on admission. - Will check hemoccult.  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 Peaked at 1.81 on 8/14 and 1.59 yesterday. Baseline around 0.9 to  1.2. - Today's BMET pending.  Confusion - Patient was a little confused last night. Possible sun downing.  - Will continue to monitor.  For questions or updates, please contact [place]mercePlease consult [email or web address] for contact info under     Signed, CDarreld Mclean PA-C  03/24/2020, 7:25 AM    The patient was seen, examined and discussed with CDarreld Mclean PA-C  and I agree with the above.   The patient continues to have sharp left upper quadrant pain, she was seen by GI consult yesterday, they feel like this is most probably secondary to fecal impaction and constipation but they are open to perform EGD if her symptoms do not improve.  She was started on stool softeners and lactulose yesterday, so far no bowel movement.   On physical exam I cannot hear any bowel sounds, we will order a stat KUB to further evaluate for possible bowel obstruction.   Her abdominal CT after cardiac cath showed no retroperitoneal bleed and significant stool impaction within the colon, she is getting muscle relaxants. From cardiac standpoint she has no further chest pain or shortness of breath.  Telemetry does not show any further episodes of atrial fibrillation.  Amiodarone has been discontinued.  Because this was first in brief episode of atrial fibrillation she was not started on systemic anticoagulation and she is already on dual antiplatelet therapy with aspirin and Plavix.  KEna Dawley MD 03/24/2020\

## 2020-03-24 NOTE — Progress Notes (Signed)
Visit made to patients room to place her on CPAP.  Patient states she don't want to wear will continue to wear oxygen.

## 2020-03-24 NOTE — Progress Notes (Signed)
Rt attempted to place cpap twice and pt seemed confused stating she has to go for an endoscopy and was asking for anxiety medication.  Rt did not place cpap at this time for these two reasons.  RT will continue to monitor.

## 2020-03-24 NOTE — Progress Notes (Signed)
Daily Rounding Note  03/24/2020, 11:52 AM  LOS: 6 days   SUBJECTIVE:   Chief complaint: Pain LUQ, anemia.       Large, brown stool last PM, nothing since.  Eating well.  Pain in LUQ improved but lingers.    OBJECTIVE:         Vital signs in last 24 hours:    Temp:  [97.9 F (36.6 C)-99.1 F (37.3 C)] 97.9 F (36.6 C) (08/17 0436) Pulse Rate:  [66-89] 89 (08/17 1036) Resp:  [16-18] 16 (08/17 0436) BP: (102-109)/(55-65) 107/62 (08/17 1036) SpO2:  [96 %-100 %] 100 % (08/17 0436) Last BM Date: 03/22/20 Filed Weights   03/20/20 0431 03/21/20 0500 03/23/20 0400  Weight: 94.9 kg 94.1 kg 97.8 kg   General: pleasant, comfortable, sleepy, not ill lookinig   Heart: RRR Chest: clear bil.  No dyspnea or cough Abdomen: soft, NT, active BS, ND  Extremities: no CCE Neuro/Psych:  Alert, oriented x 3.  Slight resting tremor L UE.    Intake/Output from previous day: 08/16 0701 - 08/17 0700 In: 360 [P.O.:360] Out: 500 [Urine:500]  Intake/Output this shift: No intake/output data recorded.  Lab Results: Recent Labs    03/22/20 0440 03/23/20 0633 03/24/20 0647  WBC 10.3 6.0 6.2  HGB 11.3* 10.0* 10.0*  HCT 35.6* 31.1* 32.4*  PLT 186 156 162   BMET Recent Labs    03/22/20 0440 03/23/20 0633 03/24/20 1042  NA 137 137 137  K 3.7 3.5 4.0  CL 96* 98 98  CO2 26 29 31   GLUCOSE 149* 135* 119*  BUN 25* 24* 22  CREATININE 1.57* 1.59* 1.32*  CALCIUM 8.7* 8.7* 9.2   LFT Recent Labs    03/23/20 1155  PROT 5.4*  ALBUMIN 2.6*  AST 15  ALT 8  ALKPHOS 47  BILITOT 0.9  BILIDIR <0.1  IBILI NOT CALCULATED   PT/INR No results for input(s): LABPROT, INR in the last 72 hours. Hepatitis Panel No results for input(s): HEPBSAG, HCVAB, HEPAIGM, HEPBIGM in the last 72 hours.  Studies/Results: DG Abd 1 View  Result Date: 03/24/2020 CLINICAL DATA:  Abdominal pain, decreased bowel sounds EXAM: ABDOMEN - 1 VIEW COMPARISON:   CT abdomen/pelvis dated 03/21/2020 FINDINGS: Nonobstructive bowel gas pattern. Degenerative changes of the lumbar spine. Mild degenerative changes of the bilateral hips, symmetric. Visualized bony pelvis appears intact. IMPRESSION: Unremarkable abdominal radiograph. Electronically Signed   By: Julian Hy M.D.   On: 03/24/2020 10:25   Scheduled Meds: . aspirin EC  81 mg Oral Daily  . brimonidine  1 drop Both Eyes BID  . Chlorhexidine Gluconate Cloth  6 each Topical Daily  . clopidogrel  75 mg Oral Daily  . divalproex  250 mg Oral BID  . dorzolamide  1 drop Both Eyes BID  . escitalopram  20 mg Oral Daily  . latanoprost  1 drop Both Eyes QHS  . levothyroxine  188 mcg Oral Q0600  . magnesium hydroxide  15 mL Oral Daily  . metoprolol tartrate  12.5 mg Oral BID  . mirabegron ER  50 mg Oral Daily  . pantoprazole  40 mg Oral Daily  . polyethylene glycol  17 g Oral BID  . rosuvastatin  40 mg Oral Daily  . sodium chloride flush  3 mL Intravenous Q12H  . sodium chloride flush  3 mL Intravenous Q12H  . vitamin B-12  1,000 mcg Oral Daily   Continuous Infusions: . sodium chloride Stopped (  03/22/20 1583)  . sodium chloride    . sodium chloride    . sodium chloride     PRN Meds:.sodium chloride, Place/Maintain arterial line **AND** sodium chloride, acetaminophen, acetaminophen, albuterol, ALPRAZolam, gabapentin, nitroGLYCERIN, ondansetron (ZOFRAN) IV, sodium chloride flush, tiZANidine   ASSESMENT:   *   Anemia, normocytic.  Hgb stable.    *   Left abd pain.   Sounds like it may be related to constipation.   Noncontrast CTAP reassuring.  Another possibility is ischemic insult to colon not detected on non-contrast study but normally ischemia is a/w bloody stools/diarrhea.    *   NSTEMI.  Previous CABG.  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   On Plavix, ASA.  not on hold.    *   AKI.      PLAN   *   EGD set for 11:30  tmrw.  Diagnostic study in setting of Plavix.  Pt agreeable.       Azucena Freed  03/24/2020, 11:52 AM Phone [phone]

## 2020-03-25 ENCOUNTER — Inpatient Hospital Stay (HOSPITAL_COMMUNITY): Payer: Medicare HMO | Admitting: Anesthesiology

## 2020-03-25 ENCOUNTER — Encounter (HOSPITAL_COMMUNITY)
Admission: EM | Disposition: A | Discharge status: Skilled Nursing Facility | Payer: Self-pay | Source: Home / Self Care | Attending: Cardiology

## 2020-03-25 ENCOUNTER — Encounter (HOSPITAL_COMMUNITY): Payer: Self-pay | Admitting: Cardiovascular Disease

## 2020-03-25 ENCOUNTER — Inpatient Hospital Stay (HOSPITAL_COMMUNITY): Payer: Medicare HMO

## 2020-03-25 DIAGNOSIS — I214 Non-ST elevation (NSTEMI) myocardial infarction: Secondary | ICD-10-CM | POA: Diagnosis not present

## 2020-03-25 DIAGNOSIS — K3189 Other diseases of stomach and duodenum: Secondary | ICD-10-CM | POA: Diagnosis not present

## 2020-03-25 DIAGNOSIS — K259 Gastric ulcer, unspecified as acute or chronic, without hemorrhage or perforation: Secondary | ICD-10-CM

## 2020-03-25 DIAGNOSIS — J9601 Acute respiratory failure with hypoxia: Secondary | ICD-10-CM

## 2020-03-25 DIAGNOSIS — N179 Acute kidney failure, unspecified: Secondary | ICD-10-CM

## 2020-03-25 DIAGNOSIS — I959 Hypotension, unspecified: Secondary | ICD-10-CM

## 2020-03-25 DIAGNOSIS — I5041 Acute combined systolic (congestive) and diastolic (congestive) heart failure: Secondary | ICD-10-CM

## 2020-03-25 HISTORY — PX: ESOPHAGOGASTRODUODENOSCOPY (EGD) WITH PROPOFOL: SHX5813

## 2020-03-25 HISTORY — PX: BIOPSY: SHX5522

## 2020-03-25 LAB — PROCALCITONIN: Procalcitonin: <0.1 ng/mL

## 2020-03-25 LAB — BASIC METABOLIC PANEL
Anion gap: 11 (ref 5–15)
BUN: 24 mg/dL — ABNORMAL HIGH (ref 8–23)
CO2: 32 mmol/L (ref 22–32)
Calcium: 9.4 mg/dL (ref 8.9–10.3)
Chloride: 95 mmol/L — ABNORMAL LOW (ref 98–111)
Creatinine, Ser: 1.38 mg/dL — ABNORMAL HIGH (ref 0.44–1.00)
GFR calc Af Amer: 42 mL/min — ABNORMAL LOW (ref >60)
GFR calc non Af Amer: 37 mL/min — ABNORMAL LOW (ref >60)
Glucose, Bld: 108 mg/dL — ABNORMAL HIGH (ref 70–99)
Potassium: 4.3 mmol/L (ref 3.5–5.1)
Sodium: 138 mmol/L (ref 135–145)

## 2020-03-25 LAB — MRSA PCR SCREENING: MRSA by PCR: NEGATIVE

## 2020-03-25 LAB — COOXEMETRY PANEL
Carboxyhemoglobin: 1.1 % (ref 0.5–1.5)
Methemoglobin: 0.8 % (ref 0.0–1.5)
O2 Saturation: 63.9 %
Total hemoglobin: 12.6 g/dL (ref 12.0–16.0)

## 2020-03-25 LAB — IRON AND TIBC
Iron: 25 ug/dL — ABNORMAL LOW (ref 28–170)
Saturation Ratios: 7 % — ABNORMAL LOW (ref 10.4–31.8)
TIBC: 365 ug/dL (ref 250–450)
UIBC: 340 ug/dL

## 2020-03-25 LAB — CBC
HCT: 38.4 % (ref 36.0–46.0)
Hemoglobin: 12.1 g/dL (ref 12.0–15.0)
MCH: 27.4 pg (ref 26.0–34.0)
MCHC: 31.5 g/dL (ref 30.0–36.0)
MCV: 87.1 fL (ref 80.0–100.0)
Platelets: 229 10*3/uL (ref 150–400)
RBC: 4.41 MIL/uL (ref 3.87–5.11)
RDW: 13.2 % (ref 11.5–15.5)
WBC: 8.1 10*3/uL (ref 4.0–10.5)
nRBC: 0 % (ref 0.0–0.2)

## 2020-03-25 LAB — MAGNESIUM: Magnesium: 2.2 mg/dL (ref 1.7–2.4)

## 2020-03-25 LAB — TROPONIN I (HIGH SENSITIVITY)
Troponin I (High Sensitivity): 496 ng/L (ref <18)
Troponin I (High Sensitivity): 463 ng/L (ref <18)

## 2020-03-25 LAB — FERRITIN: Ferritin: 108 ng/mL (ref 11–307)

## 2020-03-25 LAB — BRAIN NATRIURETIC PEPTIDE: B Natriuretic Peptide: 1834.3 pg/mL — ABNORMAL HIGH (ref 0.0–100.0)

## 2020-03-25 LAB — LACTIC ACID, PLASMA
Lactic Acid, Venous: 1.3 mmol/L (ref 0.5–1.9)
Lactic Acid, Venous: 1.2 mmol/L (ref 0.5–1.9)

## 2020-03-25 LAB — PHOSPHORUS: Phosphorus: 4.5 mg/dL (ref 2.5–4.6)

## 2020-03-25 SURGERY — ESOPHAGOGASTRODUODENOSCOPY (EGD) WITH PROPOFOL
Anesthesia: Monitor Anesthesia Care

## 2020-03-25 MED ORDER — LORAZEPAM 0.5 MG PO TABS
0.5000 mg | ORAL_TABLET | Freq: Once | ORAL | Status: AC | PRN
  Administered 2020-03-26: 0.5 mg via ORAL
  Filled 2020-03-25: qty 1

## 2020-03-25 MED ORDER — HEPARIN SODIUM (PORCINE) 5000 UNIT/ML IJ SOLN
5000.0000 [IU] | Freq: Three times a day (TID) | INTRAMUSCULAR | Status: DC
  Administered 2020-03-25 – 2020-03-31 (×18): 5000 [IU] via SUBCUTANEOUS
  Filled 2020-03-25 (×19): qty 1

## 2020-03-25 MED ORDER — FUROSEMIDE 10 MG/ML IJ SOLN
80.0000 mg | Freq: Once | INTRAMUSCULAR | Status: AC
  Administered 2020-03-25: 80 mg via INTRAVENOUS

## 2020-03-25 MED ORDER — PHENYLEPHRINE HCL-NACL 20-0.9 MG/250ML-% IV SOLN
0.0000 ug/min | INTRAVENOUS | Status: DC
  Administered 2020-03-25: 25 ug/min via INTRAVENOUS

## 2020-03-25 MED ORDER — PHENYLEPHRINE HCL-NACL 10-0.9 MG/250ML-% IV SOLN
INTRAVENOUS | Status: DC | PRN
Start: 2020-03-25 — End: 2020-03-25
  Administered 2020-03-25: 50 ug/min via INTRAVENOUS

## 2020-03-25 MED ORDER — ALBUTEROL SULFATE (2.5 MG/3ML) 0.083% IN NEBU
INHALATION_SOLUTION | RESPIRATORY_TRACT | Status: AC
  Filled 2020-03-25: qty 3

## 2020-03-25 MED ORDER — FUROSEMIDE 10 MG/ML IJ SOLN
INTRAMUSCULAR | Status: AC
  Filled 2020-03-25: qty 4

## 2020-03-25 MED ORDER — ALBUMIN HUMAN 5 % IV SOLN
INTRAVENOUS | Status: DC | PRN
Start: 2020-03-25 — End: 2020-03-25

## 2020-03-25 MED ORDER — LACTATED RINGERS IV SOLN
INTRAVENOUS | Status: AC | PRN
  Administered 2020-03-25: 1000 mL via INTRAVENOUS

## 2020-03-25 MED ORDER — PANTOPRAZOLE SODIUM 40 MG PO TBEC
40.0000 mg | DELAYED_RELEASE_TABLET | Freq: Two times a day (BID) | ORAL | Status: DC
  Administered 2020-03-25 – 2020-04-01 (×14): 40 mg via ORAL
  Filled 2020-03-25 (×15): qty 1

## 2020-03-25 MED ORDER — EPHEDRINE SULFATE-NACL 50-0.9 MG/10ML-% IV SOSY
PREFILLED_SYRINGE | INTRAVENOUS | Status: DC | PRN
  Administered 2020-03-25 (×2): 12.5 mg via INTRAVENOUS

## 2020-03-25 MED ORDER — FUROSEMIDE 10 MG/ML IJ SOLN
80.0000 mg | Freq: Two times a day (BID) | INTRAMUSCULAR | Status: DC
  Administered 2020-03-25: 80 mg via INTRAVENOUS
  Filled 2020-03-25 (×2): qty 8

## 2020-03-25 MED ORDER — PROPOFOL 10 MG/ML IV BOLUS
INTRAVENOUS | Status: DC | PRN
  Administered 2020-03-25: 10 mg via INTRAVENOUS
  Administered 2020-03-25: 20 mg via INTRAVENOUS

## 2020-03-25 MED ORDER — ALBUTEROL SULFATE (2.5 MG/3ML) 0.083% IN NEBU
2.5000 mg | INHALATION_SOLUTION | Freq: Once | RESPIRATORY_TRACT | Status: AC
  Administered 2020-03-25: 2.5 mg via RESPIRATORY_TRACT

## 2020-03-25 MED ORDER — PROPOFOL 500 MG/50ML IV EMUL
INTRAVENOUS | Status: DC | PRN
  Administered 2020-03-25: 50 ug/kg/min via INTRAVENOUS

## 2020-03-25 MED ORDER — MAGNESIUM SULFATE 2 GM/50ML IV SOLN
2.0000 g | Freq: Once | INTRAVENOUS | Status: AC
  Administered 2020-03-25: 2 g via INTRAVENOUS
  Filled 2020-03-25: qty 50

## 2020-03-25 SURGICAL SUPPLY — 14 items

## 2020-03-25 NOTE — Progress Notes (Signed)
Pateint arrived into recovery post EGD procedure with SatO2 at 88 % on NRB mask and SBP in 60's. Anesthesia MD, Dr.Mansourati and  RRT team notified. Patient placed on BiPAP, CXRay done, Lasix 80mg  IV administered and BP supported with phenylephrine  administered by Anesthesia.  Patient stabilized, foley cath placed, report called to [facility]  and patient transferred to [facility].

## 2020-03-25 NOTE — Op Note (Signed)
[facility] Patient Name: Deanna [name] Procedure Date : 03/25/2020 MRN: [id number] Attending MD: Justice Britain , MD Date of Birth: [birth year] CSN: [id number] Age: 79 Admit Type: Inpatient Procedure:                Upper GI endoscopy Indications:              Abdominal pain in the left upper quadrant, Acute                            post hemorrhagic anemia Providers:                Justice Britain, MD, Carlyn Reichert, RN, Hedy Camara Referring MD:             Cardiology Team, Docia Chuck. Henrene Pastor, MD Medicines:                Monitored Anesthesia Care Complications:            No immediate complications. Estimated Blood Loss:     Estimated blood loss was minimal. Procedure:                Pre-Anesthesia Assessment:                           - Prior to the procedure, a History and Physical                            was performed, and patient medications and                            allergies were reviewed. The patient's tolerance of                            previous anesthesia was also reviewed. The risks                            and benefits of the procedure and the sedation                            options and risks were discussed with the patient.                            All questions were answered, and informed consent                            was obtained. Prior Anticoagulants: The patient has                            taken Plavix (clopidogrel), last dose was 1 day                            prior to procedure. ASA Grade Assessment: III - A  patient with severe systemic disease. After                            reviewing the risks and benefits, the patient was                            deemed in satisfactory condition to undergo the                            procedure.                           After obtaining informed consent, the endoscope was                            passed under direct  vision. Throughout the                            procedure, the patient's blood pressure, pulse, and                            oxygen saturations were monitored continuously. The                            GIF-H190 ([id number] Olympus gastroscope was                            introduced through the mouth, and advanced to the                            second part of duodenum. The upper GI endoscopy was                            accomplished without difficulty. The patient                            tolerated the procedure. Scope In: Scope Out: Findings:      No gross lesions were noted in the entire esophagus.      The Z-line was regular and was found 38 cm from the incisors.      Segmental moderate mucosal changes characterized by erythema, erosion       and nodularity were found in the gastric antrum and in the prepyloric       region of the stomach.      No other gross lesions were noted in the entire examined stomach.       Biopsies were taken with a cold forceps for histology and Helicobacter       pylori testing.      No gross lesions were noted in the duodenal bulb, in the first portion       of the duodenum and in the second portion of the duodenum. Impression:               - No gross lesions in esophagus.                           -  Z-line regular, 38 cm from the incisors.                           - Erythematous, eroded and nodular mucosa in the                            antrum and prepyloric region of the stomach. No                            other gross lesions in the stomach. Biopsied for HP.                           - No gross lesions in the duodenal bulb, in the                            first portion of the duodenum and in the second                            portion of the duodenum. Recommendation:           - The patient will be observed post-procedure,                            until all discharge criteria are met.                           - Discharge  patient to home.                           - Patient has a contact number available for                            emergencies. The signs and symptoms of potential                            delayed complications were discussed with the                            patient. Return to normal activities tomorrow.                            Written discharge instructions were provided to the                            patient.                           - Resume regular diet.                           - Continue present medications.                           - Protonix 40 mg twice daily for next 14-month and  then decrease to once daily and maintain thereafter.                           - Observe patient's clinical course.                           - Her LUQ abdominal discomfort is not clearly                            delineated as GI related. Query with the rotational                            movement causing her issues, suspect that this is                            more MSK related.                           - Unless patient has a transfusion dependent anemia                            persist would hold on colonoscopy for evaluation of                            anemia.                           - Recommend IV Iron while in house.                           - The findings and recommendations were discussed                            with the patient.                           - The findings and recommendations were discussed                            with the patient's family.                           - The findings and recommendations were discussed                            with the referring physician. Procedure Code(s):        --- Professional ---                           [phone], Esophagogastroduodenoscopy, flexible,                            transoral; with biopsy, single or multiple Diagnosis Code(s):        --- Professional ---  K25.9, Gastric ulcer, unspecified as acute or                            chronic, without hemorrhage or perforation                           K31.89, Other diseases of stomach and duodenum                           R10.12, Left upper quadrant pain                           D62, Acute posthemorrhagic anemia CPT copyright 2019 [employer]. All rights reserved. The codes documented in this report are preliminary and upon coder review may  be revised to meet current compliance requirements. Justice Britain, MD 03/25/2020 9:00:04 AM Number of Addenda: 0

## 2020-03-25 NOTE — Significant Event (Addendum)
Called to endoscopy for patient decompensation.  Underwent EGD today, received 50 of propofol for procedure.  Developed acute hypoxia to SpO2 60s and hypotensive to SBP 60s.  Received 3 units vasopressin and started on phenylephrine gtt, currently at 100 but SBP remains in 80s.  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  Will check BP in leg to verify accuracy, consider arterial line.  CXR shows pulmonary edema.  Given dose of IV lasix 80mg .  EKG shows sinus tachycardia, rate 112 bpm, 78mm STE elevation in V1/2 unchanged from prior, along with 3mm ST elevation in aVR with diffuse ST depressions suggestive off global ischemia in setting of acute illness.  Placed on Bipap with improvement in SpO2.  She denies chest pain, reports feels short of breath but improved since starting IV lasix and started Bipap.  Consulted PCCM and transferring to [place].

## 2020-03-25 NOTE — Progress Notes (Signed)
PT Cancellation Note  Patient Details Name: Deanna [name] MRN: [id number] DOB: 10-28-1940   Cancelled Treatment:    Reason Eval/Treat Not Completed: Patient at procedure or test/unavailable     Wyona Almas, PT, DPT Acute Rehabilitation Services Pager [phone] Office [phone]    Deno Etienne 03/25/2020, 9:32 AM

## 2020-03-25 NOTE — Transfer of Care (Signed)
Immediate Anesthesia Transfer of Care Note  Patient: Deanna [name]  Procedure(s) Performed: ESOPHAGOGASTRODUODENOSCOPY (EGD) WITH PROPOFOL (N/A ) BIOPSY  Patient Location: Endoscopy Unit  Anesthesia Type:MAC  Level of Consciousness: awake, alert , oriented and patient cooperative  Airway & Oxygen Therapy: Patient Spontanous Breathing and Patient connected to face mask oxygen  Post-op Assessment: Report given to RN and Post -op Vital signs reviewed and unstable, Anesthesiologist notified  Post vital signs: Reviewed and unstable--gave Vasopressin, Esmolol, Phenylephrine, Albuterol in PACU. PACU RN, endo doc, MDA made aware. Respiratory in room to use CPAP machine. CXR ordered. Interventions ordered and being performed in endo dept.   Last Vitals:  Vitals Value Taken Time  BP 108/41 03/25/20 0920  Temp 37.1 C 03/25/20 0848  Pulse 72 03/25/20 0920  Resp 32 03/25/20 0920  SpO2 76 % 03/25/20 0920  Vitals shown include unvalidated device data.  Last Pain:  Vitals:   03/25/20 0908  TempSrc:   PainSc: 0-No pain      Patients Stated Pain Goal: 1 (17/71/16 5790)  Complications: No complications documented.

## 2020-03-25 NOTE — Progress Notes (Signed)
CRITICAL VALUE ALERT  Critical Value:  496  Date & Time Notied:  1500 03/25/20  Provider Notified: Sande Rives E,  Orders Received/Actions taken: expected value.  Eden Emms, RN

## 2020-03-25 NOTE — Progress Notes (Addendum)
Progress Note  Patient Name: Deanna [name] Date of Encounter: 03/25/2020  [facility] Cardiologist: Quay Burow, MD   Subjective   Underwent EGD this morning, received 50 of propofol for procedure.    Following procedure, developed acute hypoxia to SpO2 60s and hypotensive to SBP 60s.  Received 3 units vasopressin and started on phenylephrine gtt by anesthesia.  CXR shows pulmonary edema.  Given dose of IV lasix 80mg .  EKG shows sinus tachycardia, rate 112 bpm, 56mm STE elevation in V1/2 unchanged from prior, along with 75mm ST elevation in aVR with diffuse ST depressions suggestive off global ischemia in setting of acute illness.  Placed on Bipap with improvement in SpO2.  She denies chest pain, reports feels short of breath but improved since starting IV lasix and started Bipap.  Consulted PCCM and transfered to [facility].  Inpatient Medications    Scheduled Meds: . [MAR Hold] aspirin EC  81 mg Oral Daily  . [MAR Hold] brimonidine  1 drop Both Eyes BID  . [MAR Hold] Chlorhexidine Gluconate Cloth  6 each Topical Daily  . [MAR Hold] clopidogrel  75 mg Oral Daily  . [MAR Hold] divalproex  250 mg Oral BID  . [MAR Hold] dorzolamide  1 drop Both Eyes BID  . [MAR Hold] escitalopram  20 mg Oral Daily  . heparin injection (subcutaneous)  5,000 Units Subcutaneous Q8H  . [MAR Hold] latanoprost  1 drop Both Eyes QHS  . [MAR Hold] levothyroxine  188 mcg Oral Q0600  . [MAR Hold] magnesium hydroxide  15 mL Oral Daily  . [MAR Hold] metoprolol tartrate  12.5 mg Oral BID  . [MAR Hold] mirabegron ER  50 mg Oral Daily  . pantoprazole  40 mg Oral BID AC  . [MAR Hold] rosuvastatin  40 mg Oral Daily  . [MAR Hold] sodium chloride flush  3 mL Intravenous Q12H  . [MAR Hold] sodium chloride flush  3 mL Intravenous Q12H  . [MAR Hold] vitamin B-12  1,000 mcg Oral Daily   Continuous Infusions: . sodium chloride Stopped (03/22/20 0627)  . [MAR Hold] sodium chloride    . sodium chloride    . [MAR Hold]  sodium chloride    . magnesium sulfate bolus IVPB    . phenylephrine 150 mcg/min (03/25/20 1009)   PRN Meds: [MAR Hold] sodium chloride, Place/Maintain arterial line **AND** [MAR Hold] sodium chloride, [MAR Hold] acetaminophen, [MAR Hold] acetaminophen, [MAR Hold] albuterol, [MAR Hold] gabapentin, [MAR Hold] nitroGLYCERIN, [MAR Hold] ondansetron (ZOFRAN) IV, [MAR Hold] sodium chloride flush, [MAR Hold] tiZANidine   Vital Signs    Vitals:   03/25/20 1002 03/25/20 1005 03/25/20 1008 03/25/20 1012  BP: (!) 69/51 90/62 (!) 79/57 (!) 55/20  Pulse: (!) 102 (!) 101 (!) 101 100  Resp: 20 (!) 24 (!) 23 (!) 26  Temp:      TempSrc:      SpO2: 94% 94% 95% 96%  Weight:      Height:        Intake/Output Summary (Last 24 hours) at 03/25/2020 1020 Last data filed at 03/25/2020 0900 Gross per 24 hour  Intake 250 ml  Output 500 ml  Net -250 ml   Last 3 Weights 03/25/2020 03/25/2020 03/23/2020  Weight (lbs) 212 lb 8.4 oz 212 lb 8.4 oz 215 lb 9.8 oz  Weight (kg) 96.4 kg 96.4 kg 97.8 kg  Some encounter information is confidential and restricted. Go to Review Flowsheets activity to see all data.      Telemetry  Sinus tachycardia with rates 110s. - Personally Reviewed  ECG    EKG shows sinus tachycardia, rate 112 bpm, 35mm STE elevation in V1/2 unchanged from prior, along with 52mm ST elevation in aVR with diffuse ST depressions suggestive off global ischemia in setting of acute illness.. - Personally Reviewed  Physical Exam   GEN: On Bipap Neck: Supple. Difficult to assess JVD Cardiac: tachycardic, regular. No murmurs, rubs, or gallops.  Respiratory: Bibasilar crackles. GI: Soft, non-distended MS: No lower extremity edema. Cold extremities  Neuro:  No focal deficits. Psych: Normal affect. Responds appropriately.  Labs    High Sensitivity Troponin:   Recent Labs  Lab 03/18/20 1537 03/18/20 1737 03/18/20 2248 03/19/20 0502  TROPONINIHS 3,720* 4,011* 3,425* 3,370*       Chemistry Recent Labs  Lab 03/18/20 1918 03/19/20 0343 03/22/20 0440 03/23/20 0633 03/23/20 1155 03/24/20 1042  NA  --    < > 137 137  --  137  K  --    < > 3.7 3.5  --  4.0  CL  --    < > 96* 98  --  98  CO2  --    < > 26 29  --  31  GLUCOSE  --    < > 149* 135*  --  119*  BUN  --    < > 25* 24*  --  22  CREATININE  --    < > 1.57* 1.59*  --  1.32*  CALCIUM  --    < > 8.7* 8.7*  --  9.2  PROT 5.9*  --   --   --  5.4*  --   ALBUMIN 3.5  --   --   --  2.6*  --   AST 28  --   --   --  15  --   ALT 9  --   --   --  8  --   ALKPHOS 50  --   --   --  47  --   BILITOT 1.4*  --   --   --  0.9  --   GFRNONAA  --    < > 31* 31*  --  39*  GFRAA  --    < > 36* 36*  --  45*  ANIONGAP  --    < > 15 10  --  8   < > = values in this interval not displayed.     Hematology Recent Labs  Lab 03/22/20 0440 03/23/20 0633 03/24/20 0647  WBC 10.3 6.0 6.2  RBC 4.12 3.52* 3.65*  HGB 11.3* 10.0* 10.0*  HCT 35.6* 31.1* 32.4*  MCV 86.4 88.4 88.8  MCH 27.4 28.4 27.4  MCHC 31.7 32.2 30.9  RDW 13.2 13.4 13.3  PLT 186 156 162    BNP Recent Labs  Lab 03/18/20 1918 03/21/20 0345  BNP 912.2* 656.6*     DDimer No results for input(s): DDIMER in the last 168 hours.   Radiology    DG Abd 1 View  Result Date: 03/24/2020 CLINICAL DATA:  Abdominal pain, decreased bowel sounds EXAM: ABDOMEN - 1 VIEW COMPARISON:  CT abdomen/pelvis dated 03/21/2020 FINDINGS: Nonobstructive bowel gas pattern. Degenerative changes of the lumbar spine. Mild degenerative changes of the bilateral hips, symmetric. Visualized bony pelvis appears intact. IMPRESSION: Unremarkable abdominal radiograph. Electronically Signed   By: Julian Hy M.D.   On: 03/24/2020 10:25   DG CHEST PORT 1 VIEW  Result Date: 03/25/2020 CLINICAL DATA:  Sudden onset shortness of breath after endoscopic procedure this morning EXAM: PORTABLE CHEST 1 VIEW COMPARISON:  Six days ago FINDINGS: Chronic cardiomegaly. CABG. Diffuse hazy  pulmonary opacity which is increased from before. Trace left pleural effusion. No visible pneumothorax. Artifact from support hardware. IMPRESSION: Extensive airspace disease with small left pleural effusion, likely edema given priors. Aspiration is also considered in this clinical setting. Electronically Signed   By: Monte Fantasia M.D.   On: 03/25/2020 09:31    Cardiac Studies   Left Heart Catheterization 03/19/2020:  Ost LAD to Prox LAD lesion is 99% stenosed. LIMA to LAD known to be atretic.  After orbital atherectomy, A drug-eluting stent was successfully placed using a STENT RESOLUTE ONYX 3.5X15.  Post intervention, there is a 0% residual stenosis.  Mid LAD lesion is 100% stenosed. Despite atherectomy and angioplasty, flow could not be restored.  Mid Cx lesion is 100% stenosed. SVG to OM patent.  Prox RCA lesion is 100% stenosed. SVG to PDA occluded.  There is moderate left ventricular systolic dysfunction.  LV end diastolic pressure is moderately elevated.  The left ventricular ejection fraction is 25-35% by visual estimate.  There is no aortic valve stenosis.  Balloon angioplasty was performed using a BALLOON SAPPHIRE 2.0X12.  Complex intervention due to severe three-vessel coronary artery disease with prior bypass surgery. Successful atherectomy and stenting of the proximal LAD. Unable to restore flow through the mid LAD. I suspect, she is a somewhat late presenting infarct. She already has evidence of heart failure with elevated LVEDP. Will need ongoing medical therapy to help her LV dysfunction. _______________  Echocardiogram 03/19/2020: Impressions: 1. Left ventricular ejection fraction, by estimation, is 35 to 40%. The  left ventricle has moderately decreased function. The left ventricle  demonstrates regional wall motion abnormalities (see scoring  diagram/findings for description). Left ventricular  diastolic parameters are consistent with Grade II  diastolic dysfunction  (pseudonormalization). There is severe akinesis of the left ventricular,  mid-apical anteroseptal wall.  2. Right ventricular systolic function is mildly reduced. The right  ventricular size is normal.  3. The mitral valve is grossly normal. Mild mitral valve regurgitation.  4. The aortic valve is normal in structure. Aortic valve regurgitation is  not visualized. No aortic stenosis is present.   Patient Profile     79 y.o. 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  Decompensated following EGD this morning, SPO2 down to 60s.  Chest x-ray shows pulmonary edema. -PCCM consulted, appreciate assistance -Currently on BiPAP -Check CVP from PICC line -Received IV Lasix 80 mg x 1 with improvement.  Continue IV Lasix 80 mg twice daily  Hypotension: Hypotensive to SBP 60s following EGD this morning.  Received 3 units of vasopressin and started on phenylephrine drip, with rate up to 100.  Notably, BPs were being measured in the left upper extremity due to right upper extremity PICC line, and given patient's left subclavian stenosis, suspect these are inaccurate as there were no changes in her mental status despite very low BP readings.  BP cuff switch to leg and showed normotension, phenylephrine has been weaned off.  Would consider arterial line to accurately monitor BP.  -Check Co-ox, lactate  NSTEMI with Known CAD: High-sensitivity troponin  peaked at 4,011. Echo showed LVEF of 35-40% with severe akinesis of mid-apical anteroseptal wall and grade 2 diastolic dysfunction. LHC showed 99% stenosis of ostial to proximal LAD followed by 100% stenosis of mid LAD with known atretic LIMA to LAD. S/p successful  atherectomy and DES to proximal LAD but unable to restore flow through mid LAD. Felt to be somewhat of late presenting infarct.  No angina. - Continue DAPT with Aspirin and Plavix.  - Continue high-intensity statin - Hold beta-blocker for now given hypotension this morning  Peptic ulcer disease: Patient reported left upper quadrant pain and EGD this morning shows peptic ulcer disease. -GI consult appreciate recs, plan for Protonix 40 mg twice daily x2 months  XEN:MMHWKGS of left to right fem-fem bypass and left subclavian to left common carotid bypass for left subclavian artery stenosis. Also history of right femoral pseudoaneurysm repair. - Continue DAPT and statin.  Atrial Fibrillation: Brief episode of atrial fibrillation this admission. Started on Amiodarone but this has been discontinued. Maintaining sinus rhythm. Was not placed on anticoagulation as she is on DAPT and this was a very limited episode. - Continue to monitor.  Hyperlipidemia: Lipid panel this admission: Total Cholesterol 90, Triglycerides 172, HDL 26, LDL 30. - At LDL goal <70 given CAD. - Started on Crestor 40mg  daily. - Will need repeat lipid panel and LFTs in 6-8 weeks.  Anemia: Hemoglobin stable at 10.0 . Down from 12.4 on admission.  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 Peaked at 1.81 on 8/14 and 1.59 yesterday. Baseline around 0.9 to 1.2. - Today's BMET pending.  Confusion: confusion previously, possible sun downing.  - Will continue to monitor.  For questions or updates, please contact [place] Please consult [email or web address] for contact info under     CRITICAL CARE TIME: I have spent a total of 40 minutes with patient reviewing hospital notes, telemetry, EKGs, labs and examining the patient as well as establishing an assessment and plan that was discussed with the patient.  > 50% of time was spent in direct patient care. The patient is critically ill with multi-organ system failure  and requires high complexity decision making for assessment and support, frequent evaluation and titration of therapies, application of advanced monitoring technologies and extensive interpretation of multiple databases.  Signed, Donato Heinz, MD  03/25/2020, 10:20 AM

## 2020-03-25 NOTE — Significant Event (Signed)
Rapid Response Event Note   Reason for Call :  Hypotension/hypoxia  Initial Focused Assessment:  Patient in Endo department.  Per Staff O2 sats decreased into 60s, improved to 80% on NRB, increased WOB. SBP dropped into the 60s on neo gtt at 33mcg.  Patient is alert and talking.  Labored breathing.  Interventions:  Bipap 80mg  Lasix IV Titrated neo gtt up to 163mcg with no effect on BP Changed cuff location to left leg  BP 168/85  Able to wean neo gtt off quickly.  Plan of Care:  Tx to [place]   Event Summary:   MD Notified: MD at bedside Call Time: 0920 Arrival [id number] End Time: [place]  Raliegh Ip, RN

## 2020-03-25 NOTE — Progress Notes (Addendum)
Post-procedure, patient noted to have increasing SOB and hypoxemia. Required NRB. Required breathing treatment. Still having issues with SpO2 in the 60-80s. Will attempt CPAP v BiPAP for patient. CXR STAT ordered for bedside. Patient requiring Pressors (Neo-synepinephrine) at this as well. Appreciate Anesthesia assistance with optimization. Will reach out to Cardiology Master list to try and talk with Cardiology team (primary) as she may need CICU admission for further optimization and observation.  Justice Britain, MD [facility] Office # [phone]   9:30 AM addendum Spoke with cardiology. Chest x-ray very consistent with pulmonary edema. Patient on stable Neo-Synephrine dosing of 25 to 35 mcg. Patient on BiPAP at this time with SPO2's in the upper 80s low 90s. Patient tachycardic into the 120s. EKG has been ordered. Cardiology recommended IV Lasix. I have administered 80 mg IV Lasix and a Foley will be placed. Patient's daughter has been updated throughout this course. Patient likely having to cardiac intensive care unit first ICU for further management of hypoxemic respiratory failure.  Justice Britain, MD [facility] Office # [phone]

## 2020-03-25 NOTE — Anesthesia Postprocedure Evaluation (Signed)
Anesthesia Post Note  Patient: Deanna [name]  Procedure(s) Performed: ESOPHAGOGASTRODUODENOSCOPY (EGD) WITH PROPOFOL (N/A ) BIOPSY     Patient location during evaluation: Endoscopy Anesthesia Type: MAC Level of consciousness: awake and alert Pain management: pain level controlled Vital Signs Assessment: post-procedure vital signs reviewed and stable Respiratory status: spontaneous breathing, nonlabored ventilation, respiratory function stable and patient connected to nasal cannula oxygen Cardiovascular status: stable and blood pressure returned to baseline Postop Assessment: no apparent nausea or vomiting Anesthetic complications: no Comments: Required ICU admission post-procedure due to increased WOB and increased oxygen requirement.   No complications documented.  Last Vitals:  Vitals:   03/25/20 1500 03/25/20 1526  BP: 125/60   Pulse: 92 92  Resp: 20 19  Temp:    SpO2: 96% 93%    Last Pain:  Vitals:   03/25/20 1215  TempSrc: Axillary  PainSc:                  [name]

## 2020-03-25 NOTE — Consult Note (Addendum)
NAME:  Deanna [name], MRN:  [id number], DOB:  01/31/1941, LOS: 7 ADMISSION DATE:  03/18/2020, CONSULTATION DATE:  03/25/2020 REFERRING MD:  Dr. Percival Spanish, CHIEF COMPLAINT:  Respiratory distress    Brief History   79yo female who suffered respiratory distress post EGD for which she was placed on BIPAP and PCCM was consulted for further assistance in care.   History of present illness   Deanna [name] is a 79 y.o. with PMX significant for prior TBI, PAD, OSA on CPAP (non-compliant), MI s/p PTCA and CABG, Hypothyroidism, HTN, GERD, CHF, and anxiety who initially presented chest pain, SOB, and back pain. She diagnosed with NSTEMI and  underwent LHC 8/12 with DES placed. She has also been treated for acute combined systolic CHF resulting in acute hypoxic respiratory failure.   Additionally she has reported LUQ and cardiology was consulted who reccommended upper EGD which she has 8/18. Post procedure patient was seen with acute respiratory distress with profound hypoxia and hypotension requiring BIPAP and pressor support. PCCM was consulted to assist in care  Past Medical History  Prior TBI PAD OSA on CPAP (non-compliant) MI s/p PTCA and CABG Hypothyroidism HTN GERD CHF Anxiety   Significant Hospital Events   Admitted 8/11  Consults:  GI PCCM  Procedures:  LHC 8/12 Upper EGD 8/18  Significant Diagnostic Tests:  ECHO 8/12 > EF 35-40% with grade 2 diastolic dysfunction  CXR 8/18 > Pulmonary Edema   Micro Data:  COVID 8/11 > negative  Blood culture 8/15 >   Antimicrobials:     Interim history/subjective:  Lying on stretcher in ENDO on BIPAP, reports dyspnea improved   Objective   Blood pressure 103/78, pulse (!) 130, temperature 98.7 F (37.1 C), temperature source Axillary, resp. rate (!) 28, height 5\' 2"  (1.575 m), weight 96.4 kg, SpO2 93 %.    FiO2 (%):  [100 %] 100 %   Intake/Output Summary (Last 24 hours) at 03/25/2020 1003 Last data filed at 03/25/2020 0900 Gross  per 24 hour  Intake 250 ml  Output 500 ml  Net -250 ml   Filed Weights   03/23/20 0400 03/25/20 0405 03/25/20 0746  Weight: 97.8 kg 96.4 kg 96.4 kg    Examination: General: Acutely ill appearing female on BIPAP, in NAD HEENT: [place]/AT, MM pink/moist, PERRL,  Neuro: Alert and oriented x3 CV: s1s2 regular rate and rhythm, no murmur, rubs, or gallops,  PULM: Bilateral crackles thought all lung fields, tolerating BIPAP well  GI: soft, bowel sounds active in all 4 quadrants, non-tender, non-distended Extremities: warm/dry, no edema  Skin: no rashes or lesions   Resolved Hospital Problem list     Assessment & Plan:  Acute hypoxemic respiratory failure likely secondary to pulmonary edema.  This is in setting of delayed presentation NSTEMI with incomplete revascularization, worsening ischemic cardiomyopathy and hypotension limiting diuretic trials.  There is a question of aspiration raised by radiologist on CXR but hx more c/w former diagnosis.  Hypotension- mostly related to sedation acutely but her ongoing borderline pressures and exam raise concern for cardiogenic shock.  - Check SvO2 (68% on 8/14), BNP, trop, lactate - Consideration for inotropes and diuretics based on results above - Neosynephrine being given out of concern for worsening Afib/RVR; would just consider doing levophed+amiodarone if needed - BIPAP for now, watch in ICU as high risk for intubation, NPO - Check Mg/Phos, replete PRN  Likely CKD with some mild AKI- multifactorial given cath, baseline metabolic syndrome, cardiomyopathy. - Trend, strict I/O  Erosive gastritis - Seen on EGD 8/18 - Continue BID PPI - May resume diet - IV iron per GI   Metabolic encephalopathy- supportive care, re-orientation as able  PAD, HLD, class 3 obesity- NTD acutely here  Best practice:  Diet: NPO Pain/Anxiety/Delirium protocol (if indicated): N/A VAP protocol (if indicated): N/A DVT prophylaxis: heparin GI prophylaxis:  PPI Glucose control: daily checks should be fine Mobility: BR Code Status: full Family Communication: per primary Disposition: ICU given risk of intubation  Labs   CBC: Recent Labs  Lab 03/21/20 0345 03/21/20 2124 03/22/20 0440 03/23/20 0633 03/24/20 0647  WBC 8.9 7.7 10.3 6.0 6.2  HGB 11.2* 10.7* 11.3* 10.0* 10.0*  HCT 35.4* 33.3* 35.6* 31.1* 32.4*  MCV 87.2 86.3 86.4 88.4 88.8  PLT 142* 138* 186 156 073    Basic Metabolic Panel: Recent Labs  Lab 03/18/20 1918 03/19/20 0343 03/20/20 0852 03/21/20 0345 03/22/20 0440 03/23/20 0633 03/24/20 1042  NA  --    < > 138 138 137 137 137  K  --    < > 3.7 3.7 3.7 3.5 4.0  CL  --    < > 97* 95* 96* 98 98  CO2  --    < > 29 29 26 29 31   GLUCOSE  --    < > 144* 149* 149* 135* 119*  BUN  --    < > 20 23 25* 24* 22  CREATININE  --    < > 1.39* 1.81* 1.57* 1.59* 1.32*  CALCIUM  --    < > 9.0 8.9 8.7* 8.7* 9.2  MG 1.8  --   --   --   --   --   --    < > = values in this interval not displayed.   GFR: Estimated Creatinine Clearance: 38 mL/min (A) (by C-G formula based on SCr of 1.32 mg/dL (H)). Recent Labs  Lab 03/21/20 2124 03/21/20 2320 03/22/20 0440 03/23/20 0633 03/24/20 0647  PROCALCITON  --  <0.10 <0.10 <0.10  --   WBC 7.7  --  10.3 6.0 6.2  LATICACIDVEN  --  1.2  --   --   --     Liver Function Tests: Recent Labs  Lab 03/18/20 1918 03/23/20 1155  AST 28 15  ALT 9 8  ALKPHOS 50 47  BILITOT 1.4* 0.9  PROT 5.9* 5.4*  ALBUMIN 3.5 2.6*   Recent Labs  Lab 03/23/20 1155  LIPASE 24  AMYLASE 19*   No results for input(s): AMMONIA in the last 168 hours.  ABG    Component Value Date/Time   PHART 7.337 (L) 04/26/2015 0207   PCO2ART 45.5 (H) 04/26/2015 0207   PO2ART 91.0 04/26/2015 0207   HCO3 24.2 (H) 04/26/2015 0207   TCO2 24 08/22/2017 0854   ACIDBASEDEF 2.0 04/26/2015 0207   O2SAT 67.7 03/21/2020 2326     Coagulation Profile: Recent Labs  Lab 03/19/20 0343  INR 1.2    Cardiac Enzymes: No  results for input(s): CKTOTAL, CKMB, CKMBINDEX, TROPONINI in the last 168 hours.  HbA1C: Hgb A1c MFr Bld  Date/Time Value Ref Range Status  03/18/2020 07:18 PM 5.8 (H) 4.8 - 5.6 % Final    Comment:    (NOTE) Pre diabetes:          5.7%-6.4%  Diabetes:              >6.4%  Glycemic control for   <7.0% adults with diabetes   01/11/2019 10:02 AM 5.6 <5.7 %  of total Hgb Final    Comment:    For the purpose of screening for the presence of diabetes: . <5.7%       Consistent with the absence of diabetes 5.7-6.4%    Consistent with increased risk for diabetes             (prediabetes) > or =6.5%  Consistent with diabetes . This assay result is consistent with a decreased risk of diabetes. . Currently, no consensus exists regarding use of hemoglobin A1c for diagnosis of diabetes in children. . According to American Diabetes Association (ADA) guidelines, hemoglobin A1c <7.0% represents optimal control in non-pregnant diabetic patients. Different metrics may apply to specific patient populations.  Standards of Medical Care in Diabetes(ADA). .     CBG: No results for input(s): GLUCAP in the last 168 hours.  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[facility]), CHF (congestive heart failure) ([place]) (11/14/2017), Chronic bilateral low back pain without sciatica, Cognitive impairment, Colon polyps, Community acquired pneumonia, Depression, Diverticulosis, Dysuria, Encephalomalacia, GAD (generalized anxiety disorder), GERD (gastroesophageal reflux disease), Glaucoma, Hearing loss, Hemorrhoids, Hypertension, Hypothyroidism, Incontinence, Mixed hyperlipidemia, Myocardial infarction ([place]), OSA on CPAP, Peripheral arterial disease  ([place]), Pneumonitis (04/26/2015), Pyelonephritis, Pyelonephritis due to Escherichia coli (04/28/2015), Sepsis ([place]), Sinus drainage, Spondylosis of lumbar region without myelopathy or radiculopathy, 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[place]), Urticaria, and Vertigo, benign positional.   Surgical History    Past Surgical History:  Procedure Laterality Date  . ABDOMINAL AORTOGRAM W/LOWER EXTREMITY N/A 08/22/2017   Procedure: ABDOMINAL AORTOGRAM W/LOWER EXTREMITY;  Surgeon: Serafina Mitchell, MD;  Location: [facility];  Service: Cardiovascular;  Laterality: N/A;  . BILATERAL UPPER EXTREMITY ANGIOGRAM N/A 09/11/2012   Procedure: BILATERAL UPPER EXTREMITY ANGIOGRAM;  Surgeon: Serafina Mitchell, MD;  Location: [facility] CATH LAB;  Service: Cardiovascular;  Laterality: N/A;  . carotid duplex doppler Bilateral 09/03/2012, 11/03/2011   Evidence of 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 patent left common carotid- subclavian artery bypass with turbulent flow noted at the anastomosis with velocities of 295 cm/s  . CAROTID-SUBCLAVIAN BYPASS GRAFT  12/15/2011   Procedure: BYPASS GRAFT CAROTID-SUBCLAVIAN;  Surgeon: Serafina Mitchell, MD;  Location: [facility] OR;  Service: Vascular;  Laterality: Left;  Left Carotid subclavian bypass  . CORONARY ANGIOPLASTY    . CORONARY ARTERY BYPASS GRAFT    . CORONARY ATHERECTOMY N/A 03/19/2020   Procedure: CORONARY ATHERECTOMY;  Surgeon: Jettie Booze, MD;  Location: [facility];  Service: Cardiovascular;  Laterality: N/A;  . CORONARY STENT INTERVENTION N/A 03/19/2020   Procedure: CORONARY STENT INTERVENTION;  Surgeon: Jettie Booze, MD;  Location: [place] [facility];  Service: Cardiovascular;  Laterality: N/A;  . DOPPLER ECHOCARDIOGRAPHY  05/27/2010, 09/17/2008   Mild Proximal septal thickening is noted. Left ventricular systolic functions  is normal ejection fraction =>55%. the aortic valve appears to be mildly sclerotic   . fem-fem bypass graft  1999  . holter monitor  01/21/2008   The predominant rhythm was normal sinus rhythm. Minimum heartrate of 63 bpm at +01:00, maximum heartrate of 105 bpm at + 10:35; and the average heartrate of 75 bpm. Ventricular ectopic activity totaled 1270: Multifocal; 866-PVC's and 404-VEs             .  JOINT REPLACEMENT     Left knee  . LEFT HEART CATH AND CORS/GRAFTS ANGIOGRAPHY N/A 03/19/2020   Procedure: LEFT HEART CATH AND CORS/GRAFTS ANGIOGRAPHY;  Surgeon: Jettie Booze, MD;  Location: [facility];  Service: Cardiovascular;  Laterality: N/A;  . LEFT HEART CATHETERIZATION WITH CORONARY/GRAFT ANGIOGRAM N/A 12/21/2011   Procedure: LEFT HEART CATHETERIZATION WITH Beatrix Fetters;  Surgeon: Leonie Man, MD;  Location: [facility] CATH LAB;  Service: Cardiovascular;  Laterality: N/A;  . NM MYOCAR PERF EJECTION FRACTION  09/22/2009, 07/03/2007   the post stress myocardial perfusion images show a normal pattern of perfusion is all regions. The post-stress ejection fraction is 68 %. no significant wall motion abnormalities noted. This is a low risk scan.  Marland Kitchen PERIPHERAL VASCULAR INTERVENTION  08/22/2017   Procedure: PERIPHERAL VASCULAR INTERVENTION;  Surgeon: Serafina Mitchell, MD;  Location: [place];  Service: Cardiovascular;;  Fem/Fem Graft  . REPLACEMENT TOTAL KNEE  05-2011  . UNILATERAL UPPER EXTREMEITY ANGIOGRAM Left 11/15/2011   Procedure: UNILATERAL UPPER EXTREMEITY ANGIOGRAM;  Surgeon: Lorretta Harp, MD;  Location: [facility] CATH LAB;  Service: Cardiovascular;  Laterality: Left;     Social History   reports that she quit smoking about 38 years ago. Her smoking use included cigarettes. She quit after 3.00 years of use. She has never used smokeless tobacco. She reports previous alcohol use. She reports that she does not use drugs.   Family History   Her family history includes  Anxiety disorder in her daughter and sister; Anxiety disorder (age of onset: 71) in her sister; Cerebral palsy (age of onset: 26) in her sister; Colon cancer (age of onset: 19) in her brother; Congestive Heart Failure (age of onset: 51) in her sister; Dementia in her sister; Depression in her daughter; Diabetes in her father; Heart Problems in her sister; Heart Problems (age of onset: 53) in her sister; Heart attack in her mother; Heart attack (age of onset: 92) in her father; Heart attack (age of onset: 75) in her brother and sister; Heart attack (age of onset: 39) in her sister; Heart disease in her father and mother; Hyperlipidemia in her father; Hypertension in her daughter and father; Hypertension (age of onset: 28) in her sister; Irritable bowel syndrome in her daughter; Prostate cancer (age of onset: 18) in her brother; Stroke in her sister.   Allergies Allergies  Allergen Reactions  . Donepezil Other (See Comments)    Altered mood, aggression, and caused anger  . Duloxetine Hcl     Increased confusion and memory concerns   . Meloxicam     Pt suspects that this medicine causes fluid on her lungs.   . Memantine Other (See Comments)    Caused severe aggression  . 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 Vicodin [Hydrocodone-Acetaminophen] Other (See Comments)    Delirium, confusion, and toxic dementia     Home Medications  Prior to Admission medications   Medication Sig Start Date End Date Taking? Authorizing Provider  acetaminophen (TYLENOL) 325 MG tablet Take 325 mg by mouth in the morning and at bedtime. 1 by mouth in the morning and 1 by mouth in the pm.   Yes [provider]  ALPRAZolam (XANAX) 0.5 MG tablet Take 1 tablet (0.5 mg total) by mouth daily as needed for anxiety. 02/26/20  Yes Thayer Headings, PMHNP  amLODipine (NORVASC) 5 MG tablet TAKE 1 TABLET EVERY DAY Patient taking  differently: Take 5 mg by mouth daily.   11/12/19  Yes Lauree Chandler, NP  aspirin EC 81 MG tablet Take 81 mg by mouth at bedtime.    Yes [provider]  brimonidine (ALPHAGAN) 0.2 % ophthalmic solution Place 1 drop into both eyes 2 (two) times daily.   Yes [provider]  calcium carbonate (TUMS - DOSED IN MG ELEMENTAL CALCIUM) 500 MG chewable tablet Chew 1 tablet by mouth daily as needed for indigestion or heartburn.   Yes [provider]  cholecalciferol (VITAMIN D) 400 units TABS tablet Take 400 Units by mouth daily.   Yes [provider]  clopidogrel (PLAVIX) 75 MG tablet TAKE 1 TABLET (75 MG TOTAL) BY MOUTH DAILY. 11/25/19  Yes Lorretta Harp, MD  divalproex (DEPAKOTE ER) 250 MG 24 hr tablet TAKE 1 TABLET(250 MG) BY MOUTH TWICE DAILY 02/26/20  Yes Thayer Headings, PMHNP  dorzolamide (TRUSOPT) 2 % ophthalmic solution Place 1 drop into both eyes 2 (two) times daily.   Yes [provider]  escitalopram (LEXAPRO) 20 MG tablet TAKE 1 TABLET(20 MG) BY MOUTH DAILY Patient taking differently: Take 20 mg by mouth daily.  02/26/20  Yes Thayer Headings, PMHNP  gabapentin (NEURONTIN) 100 MG capsule Take 1 capsule (100 mg total) by mouth 2 (two) times daily as needed. Patient taking differently: Take 200 mg by mouth 2 (two) times daily as needed (For nerve pain).  08/16/19  Yes Lauree Chandler, NP  lansoprazole (PREVACID) 15 MG capsule Take 15 mg by mouth daily as needed (For heartburn).   Yes [provider]  latanoprost (XALATAN) 0.005 % ophthalmic solution Place 1 drop into both eyes at bedtime.   Yes [provider]  levothyroxine (SYNTHROID) 100 MCG tablet Take 1 tablet (100 mcg total) by mouth daily before breakfast. To take with 88 mcg tablet to equal 188 mcg total 12/13/19  Yes Lauree Chandler, NP  levothyroxine (SYNTHROID) 88 MCG tablet Take 1 tablet (88 mcg total) by mouth daily before breakfast. To take with 100 mcg tablet to equal 188 mcg total 12/13/19  Yes Lauree Chandler, NP  meclizine (ANTIVERT) 25 MG tablet Take 1 tablet (25 mg total) by mouth as needed for dizziness. 11/30/18  Yes Lauree Chandler, NP  metoprolol tartrate (LOPRESSOR) 25 MG tablet TAKE 1 TABLET TWICE DAILY Patient taking differently: Take 12.5 mg by mouth 2 (two) times daily.  11/12/19  Yes Lauree Chandler, NP  mirabegron ER (MYRBETRIQ) 50 MG TB24 tablet Take 50 mg by mouth daily.   Yes [provider]  pantoprazole (PROTONIX) 40 MG tablet TAKE 1 TABLET EVERY DAY Patient taking differently: Take 40 mg by mouth daily.  11/25/19  Yes Lauree Chandler, NP  rosuvastatin (CRESTOR) 40 MG tablet Take 1 tablet (40 mg total) by mouth daily. 08/23/19  Yes Lauree Chandler, NP  tiZANidine (ZANAFLEX) 2 MG tablet Take 1 tablet (2 mg total) by mouth every 6 (six) hours as needed for muscle spasms. 07/01/19  Yes Ngetich, Dinah C, NP  valsartan (DIOVAN) 80 MG tablet TAKE 1 TABLET EVERY MORNING Patient taking differently: Take 80 mg by mouth daily.  11/12/19  Yes Lauree Chandler, NP  vitamin B-12 (CYANOCOBALAMIN) 1000 MCG tablet Take 1,000 mcg by mouth daily.   Yes [provider]  furosemide (LASIX) 40 MG tablet Take 1 tablet (40 mg total) by mouth daily as needed. Take 1 tablet if you have more than a 2 pound weight  gain in 24 hours as needed. Patient not taking: Reported on 03/18/2020 11/16/17 11/30/38  Rama, Venetia Maxon, MD     Critical care time:    Performed by: Johnsie Cancel  Total critical care time: 45 minutes  Critical care time was exclusive of separately billable procedures and treating other patients.  Critical care was necessary to treat or prevent imminent or life-threatening deterioration.  Critical care was time spent personally by me on the following activities: development of treatment plan with patient and/or surrogate as well as nursing, discussions with consultants, evaluation of patient's response to treatment, examination of patient, obtaining  history from patient or surrogate, ordering and performing treatments and interventions, ordering and review of laboratory studies, ordering and review of radiographic studies, pulse oximetry and re-evaluation of patient's condition.  Johnsie Cancel, NP-C [facility] Contact / Pager information can be found on Amion  03/25/2020, 10:27 AM   Attending addendum 03/25/2020   Patient seen and examined with Nurse Practitioner.  Agree with their assessment and plan as written.  S: Seen for respiratory distress and hypotension.  Had delayed NSTEMI with incomplete revascularization and resultant worsened ischemic cardiomyopathy and pulmonary edema.  Diuresis limited by hypotension.  Also developed some LUQ pain so went for EGD today, sedation induced hypotension and worsening respiratory status so placed on BIPAP and PCCM called.   O: Blood pressure 103/78, pulse (!) 130, temperature 98.7 F (37.1 C), temperature source Axillary, resp. rate (!) 28, height 5\' 2"  (1.575 m), weight 96.4 kg, SpO2 93 %.  Pale ill appearing woman tachypneic on BIPAP BIPAP in place with intermittent good seal depending on if patient is talking Abdomen soft, hypoactive BS Lungs crackles throughout Ext actually show fairly little edema  CXR worsening pulmonary edema L>R Weights and I/O I doubt are accurate Labs today pending  A: Acute hypoxemic respiratory failure due to worsening pulmonary edema, doubt aspiration  P: Check labs above and consider inotropic support depending on trajectory BIPAP now and PRN Will check left chest with [place] Details of plan above written and shared with NP  32 min cc time Erskine Emery MD PCCM  ADDENDUM: 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   This now seems more just systolic heart failure with pulmonary edema rather than any element of cardiogenic shock.  Would push  diuresis.

## 2020-03-25 NOTE — Progress Notes (Signed)
Pt refuses CPAP 

## 2020-03-25 NOTE — Progress Notes (Signed)
7 beats of [place] noted per telemetry monitoring, patient asymptomatic, v/s stable , denies any SOB nor chest discomforts. Dr [place] Bali (on call Md) notified. No new order, continue to monitor patient.

## 2020-03-26 ENCOUNTER — Encounter: Payer: Medicare HMO | Admitting: Nurse Practitioner

## 2020-03-26 DIAGNOSIS — R109 Unspecified abdominal pain: Secondary | ICD-10-CM

## 2020-03-26 DIAGNOSIS — K2900 Acute gastritis without bleeding: Secondary | ICD-10-CM

## 2020-03-26 LAB — CBC
HCT: 32.3 % — ABNORMAL LOW (ref 36.0–46.0)
Hemoglobin: 10.3 g/dL — ABNORMAL LOW (ref 12.0–15.0)
MCH: 27.5 pg (ref 26.0–34.0)
MCHC: 31.9 g/dL (ref 30.0–36.0)
MCV: 86.4 fL (ref 80.0–100.0)
Platelets: 183 10*3/uL (ref 150–400)
RBC: 3.74 MIL/uL — ABNORMAL LOW (ref 3.87–5.11)
RDW: 12.9 % (ref 11.5–15.5)
WBC: 5.9 10*3/uL (ref 4.0–10.5)
nRBC: 0 % (ref 0.0–0.2)

## 2020-03-26 LAB — BASIC METABOLIC PANEL
Anion gap: 11 (ref 5–15)
BUN: 29 mg/dL — ABNORMAL HIGH (ref 8–23)
CO2: 35 mmol/L — ABNORMAL HIGH (ref 22–32)
Calcium: 9.1 mg/dL (ref 8.9–10.3)
Chloride: 89 mmol/L — ABNORMAL LOW (ref 98–111)
Creatinine, Ser: 1.58 mg/dL — ABNORMAL HIGH (ref 0.44–1.00)
GFR calc Af Amer: 36 mL/min — ABNORMAL LOW (ref >60)
GFR calc non Af Amer: 31 mL/min — ABNORMAL LOW (ref >60)
Glucose, Bld: 119 mg/dL — ABNORMAL HIGH (ref 70–99)
Potassium: 3.5 mmol/L (ref 3.5–5.1)
Sodium: 135 mmol/L (ref 135–145)

## 2020-03-26 LAB — SURGICAL PATHOLOGY

## 2020-03-26 LAB — MAGNESIUM: Magnesium: 2.5 mg/dL — ABNORMAL HIGH (ref 1.7–2.4)

## 2020-03-26 MED ORDER — METOPROLOL TARTRATE 12.5 MG HALF TABLET
12.5000 mg | ORAL_TABLET | Freq: Two times a day (BID) | ORAL | Status: DC
  Administered 2020-03-26 – 2020-03-27 (×3): 12.5 mg via ORAL
  Filled 2020-03-26 (×4): qty 1

## 2020-03-26 MED ORDER — POLYETHYLENE GLYCOL 3350 17 G PO PACK
17.0000 g | PACK | Freq: Every day | ORAL | Status: DC
  Administered 2020-03-26 – 2020-04-01 (×7): 17 g via ORAL
  Filled 2020-03-26 (×7): qty 1

## 2020-03-26 MED ORDER — POTASSIUM CHLORIDE CRYS ER 20 MEQ PO TBCR
40.0000 meq | EXTENDED_RELEASE_TABLET | Freq: Once | ORAL | Status: AC
  Administered 2020-03-26: 40 meq via ORAL
  Filled 2020-03-26: qty 2

## 2020-03-26 MED ORDER — FUROSEMIDE 10 MG/ML IJ SOLN
40.0000 mg | Freq: Two times a day (BID) | INTRAMUSCULAR | Status: DC
  Administered 2020-03-26 – 2020-03-27 (×3): 40 mg via INTRAVENOUS
  Filled 2020-03-26 (×2): qty 4

## 2020-03-26 NOTE — Progress Notes (Signed)
Pt states she prefers to wear just O2 tonight due to our mask not being comfortable for her. Advised pt to have someone bring her mask and tubing if she prefers to use instead of ours. Pt states she will have her mask and tubing here tomorrow. RT will continue to monitor.

## 2020-03-26 NOTE — Progress Notes (Signed)
Pt placed on CPAP with full face mask and bled in oxygen @5  liters

## 2020-03-26 NOTE — Progress Notes (Signed)
Progress Note  Patient Name: Deanna [name] Date of Encounter: 03/26/2020  [facility] Cardiologist: Quay Burow, MD   Subjective   No CP or dyspnea  Inpatient Medications    Scheduled Meds:  aspirin EC  81 mg Oral Daily   brimonidine  1 drop Both Eyes BID   Chlorhexidine Gluconate Cloth  6 each Topical Daily   clopidogrel  75 mg Oral Daily   divalproex  250 mg Oral BID   dorzolamide  1 drop Both Eyes BID   escitalopram  20 mg Oral Daily   furosemide  80 mg Intravenous BID   heparin injection (subcutaneous)  5,000 Units Subcutaneous Q8H   latanoprost  1 drop Both Eyes QHS   levothyroxine  188 mcg Oral Q0600   magnesium hydroxide  15 mL Oral Daily   mirabegron ER  50 mg Oral Daily   pantoprazole  40 mg Oral BID AC   rosuvastatin  40 mg Oral Daily   sodium chloride flush  3 mL Intravenous Q12H   sodium chloride flush  3 mL Intravenous Q12H   vitamin B-12  1,000 mcg Oral Daily   Continuous Infusions:  sodium chloride Stopped (03/22/20 0627)   sodium chloride     sodium chloride     sodium chloride     phenylephrine Stopped (03/25/20 1021)   PRN Meds: sodium chloride, Place/Maintain arterial line **AND** sodium chloride, acetaminophen, acetaminophen, albuterol, gabapentin, LORazepam, nitroGLYCERIN, ondansetron (ZOFRAN) IV, sodium chloride flush, tiZANidine   Vital Signs    Vitals:   03/25/20 1838 03/25/20 2132 03/26/20 0119 03/26/20 0633  BP: (!) 116/56 132/67 106/60 96/80  Pulse: 90 92 84 83  Resp: 19 14 18 20   Temp: 98.5 F (36.9 C) 98.3 F (36.8 C) 97.8 F (36.6 C) 98.4 F (36.9 C)  TempSrc: Oral Oral Oral Oral  SpO2: 90% 95% 96% 94%  Weight:    91.7 kg  Height:        Intake/Output Summary (Last 24 hours) at 03/26/2020 0809 Last data filed at 03/26/2020 7619 Gross per 24 hour  Intake 764.94 ml  Output 3475 ml  Net -2710.06 ml   Last 3 Weights 03/26/2020 03/25/2020 03/25/2020  Weight (lbs) 202 lb 2.6 oz 212 lb 8.4 oz 212  lb 8.4 oz  Weight (kg) 91.7 kg 96.4 kg 96.4 kg  Some encounter information is confidential and restricted. Go to Review Flowsheets activity to see all data.      Telemetry    Sinus - Personally Reviewed  Physical Exam   GEN: No acute distress.   Neck: No JVD Cardiac: RRR, no murmurs, rubs, or gallops.  Respiratory:  Mildly diminished breath sounds bases. GI: Soft, nontender, non-distended  MS: No edema Neuro:  Nonfocal  Psych: Normal affect   Labs    High Sensitivity Troponin:   Recent Labs  Lab 03/18/20 1737 03/18/20 2248 03/19/20 0502 03/25/20 1405 03/25/20 2147  TROPONINIHS 4,011* 3,425* 3,370* 496* 463*      Chemistry Recent Labs  Lab 03/23/20 5093 03/23/20 1155 03/24/20 1042 03/25/20 1405 03/26/20 0500  NA   < >  --  137 138 135  K   < >  --  4.0 4.3 3.5  CL   < >  --  98 95* 89*  CO2   < >  --  31 32 35*  GLUCOSE   < >  --  119* 108* 119*  BUN   < >  --  22 24* 29*  CREATININE   < >  --  1.32* 1.38* 1.58*  CALCIUM   < >  --  9.2 9.4 9.1  PROT  --  5.4*  --   --   --   ALBUMIN  --  2.6*  --   --   --   AST  --  15  --   --   --   ALT  --  8  --   --   --   ALKPHOS  --  47  --   --   --   BILITOT  --  0.9  --   --   --   GFRNONAA   < >  --  39* 37* 31*  GFRAA   < >  --  45* 42* 36*  ANIONGAP   < >  --  8 11 11    < > = values in this interval not displayed.     Hematology Recent Labs  Lab 03/24/20 0647 03/25/20 1405 03/26/20 0500  WBC 6.2 8.1 5.9  RBC 3.65* 4.41 3.74*  HGB 10.0* 12.1 10.3*  HCT 32.4* 38.4 32.3*  MCV 88.8 87.1 86.4  MCH 27.4 27.4 27.5  MCHC 30.9 31.5 31.9  RDW 13.3 13.2 12.9  PLT 162 229 183    BNP Recent Labs  Lab 03/21/20 0345 03/25/20 1405  BNP 656.6* 1,834.3*     Radiology    DG Abd 1 View  Result Date: 03/24/2020 CLINICAL DATA:  Abdominal pain, decreased bowel sounds EXAM: ABDOMEN - 1 VIEW COMPARISON:  CT abdomen/pelvis dated 03/21/2020 FINDINGS: Nonobstructive bowel gas pattern. Degenerative changes of  the lumbar spine. Mild degenerative changes of the bilateral hips, symmetric. Visualized bony pelvis appears intact. IMPRESSION: Unremarkable abdominal radiograph. Electronically Signed   By: Julian Hy M.D.   On: 03/24/2020 10:25   DG CHEST PORT 1 VIEW  Result Date: 03/25/2020 CLINICAL DATA:  Sudden onset shortness of breath after endoscopic procedure this morning EXAM: PORTABLE CHEST 1 VIEW COMPARISON:  Six days ago FINDINGS: Chronic cardiomegaly. CABG. Diffuse hazy pulmonary opacity which is increased from before. Trace left pleural effusion. No visible pneumothorax. Artifact from support hardware. IMPRESSION: Extensive airspace disease with small left pleural effusion, likely edema given priors. Aspiration is also considered in this clinical setting. Electronically Signed   By: Monte Fantasia M.D.   On: 03/25/2020 09:31    Patient Profile     79 y.o. 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  Echocardiogram showed ejection fraction 35 to 94%, grade 2 diastolic dysfunction, akinesis of the anteroseptal wall and apex, mild mitral regurgitation.  Cardiac catheterization revealed 99% proximal LAD with atretic LIMA to the LAD, 100% mid LAD, occluded circumflex, occluded right coronary artery.  Patient had PCI of the proximal LAD but mid LAD unsuccessful.  Medical therapy recommended for her residual coronary disease and for CHF.  Patient developed CHF following EGD yesterday.  Was felt to be hypotensive and placed on pressors but then noted to have left subclavian stenosis.  Blood pressures should be taken in legs.  Assessment & Plan    1 acute on chronic combined systolic/diastolic congestive heart failure-patient developed CHF yesterday following EGD.  She is much improved this morning.  Decrease Lasix to 40 mg IV twice  daily.  Can likely transition to oral Lasix tomorrow.  Follow renal function closely.  2 non-ST elevation myocardial infarction-patient is status post PCI of the proximal LAD but attempt at mid LAD unsuccessful.  Plan is medical therapy.  Continue aspirin, Plavix and statin.  Resume metoprolol 12.5 mg twice daily.  Transition to Toprol at time of discharge once dose stable.  3 ischemic cardiomyopathy-we will resume low-dose beta-blocker and transition to Toprol at discharge as outlined.  Will resume ARB tomorrow if renal function and blood pressure stable.  4 peripheral vascular disease-patient has known left subclavian stenosis.  Continue aspirin and statin.  Blood pressure should not be taken in left upper extremity (patient apparently was documented to have hypotension yesterday but pressure taken from left upper extremity).  5 hyperlipidemia-continue statin.  6 chronic stage III kidney disease-follow renal function closely in-house.  7 abdominal pain-EGD yesterday showed mild gastritis.  Hemoglobin stable.  Need to begin ambulating and weaning O2.  For questions or updates, please contact [place] Please consult [email or web address] for contact info under        Signed, Kirk Ruths, MD  03/26/2020, 8:09 AM

## 2020-03-26 NOTE — Progress Notes (Signed)
Physical Therapy Treatment Patient Details Name: Deanna [name] MRN: [id number] DOB: 1940-10-05 Today's Date: 03/26/2020    History of Present Illness Pt is a 79 y/o female admitted secondary to SOB and chest pain. Found to have NSTEMI and is s/p heart cath with arthrectomy. Pt also with respiratory failure during admission and requiring supplemental oxygen. PMH includes HTN, CHF, a fib, CAD, and CKD.     PT Comments    Pt asleep on entry, rouses easily. Pt reports she has not been out of bed in a week despite it only being 3 days, and reports that she does not think that she can get up and walk. With encouragement pt agreeable to try. Pt is limited in safe mobility by abdominal pain and increased O2 demand in presence of decreased strength, balance and endurance. Pt is currently modA for bed mobility, min A for transfers and ambulation of 18 feet with RW. D/c plans remain appropriate at this time. PT will continue to follow acutely to progress mobility.      Follow Up Recommendations  Home health PT;Supervision for mobility/OOB     Equipment Recommendations  Rolling walker with 5" wheels (TBD)       Precautions / Restrictions Precautions Precautions: Fall Precaution Comments: watch O2 Restrictions Weight Bearing Restrictions: No    Mobility  Bed Mobility Overal bed mobility: Needs Assistance Bed Mobility: Sit to Supine     Supine to sit: Mod assist     General bed mobility comments: able to get LE to EoB but requires modA for bringing trunk to upright and pad scoot of hips to EoB  Transfers Overall transfer level: Needs assistance Equipment used: Rolling walker (2 wheeled) Transfers: Sit to/from Stand Sit to Stand: Min assist         General transfer comment: min A for power up and steadying   Ambulation/Gait Ambulation/Gait assistance: Min assist Gait Distance (Feet): 18 Feet Assistive device: Rolling walker (2 wheeled) Gait Pattern/deviations: Step-through  pattern;Decreased stride length;Antalgic Gait velocity: Decreased Gait velocity interpretation: <1.31 ft/sec, indicative of household ambulator General Gait Details: hands on min guard for ambulating around foot of bed and back, pt with decreased confidence in her abilities requiring encouragement          Balance Overall balance assessment: Needs assistance Sitting-balance support: No upper extremity supported Sitting balance-Leahy Scale: Fair     Standing balance support: Bilateral upper extremity supported;During functional activity Standing balance-Leahy Scale: Poor Standing balance comment: Reliant on BUE support                             Cognition Arousal/Alertness: Awake/alert Behavior During Therapy: WFL for tasks assessed/performed Overall Cognitive Status: Impaired/Different from baseline                         Following Commands: Follows one step commands with increased time;Follows multi-step commands with increased time     Problem Solving: Slow processing;Requires verbal cues;Requires tactile cues General Comments: requires increased time, but is consistent in command follow         General Comments General comments (skin integrity, edema, etc.): Pt on 9L O2 via HFNC maintaining SaO2 >92%O2 throughout session HR 90-15bpm      Pertinent Vitals/Pain Pain Assessment: Faces Faces Pain Scale: Hurts even more Pain Location: L groinand lower abdomen Pain Descriptors / Indicators: Grimacing;Guarding Pain Intervention(s): Monitored during session;Repositioned  PT Goals (current goals can now be found in the care plan section) Acute Rehab PT Goals Patient Stated Goal: to feel better PT Goal Formulation: With patient Time For Goal Achievement: 04/06/20 Potential to Achieve Goals: Good Progress towards PT goals: Progressing toward goals    Frequency    Min 3X/week      PT Plan Current plan remains appropriate        AM-PAC PT "6 Clicks" Mobility   Outcome Measure  Help needed turning from your back to your side while in a flat bed without using bedrails?: A Little Help needed moving from lying on your back to sitting on the side of a flat bed without using bedrails?: A Little Help needed moving to and from a bed to a chair (including a wheelchair)?: A Little Help needed standing up from a chair using your arms (e.g., wheelchair or bedside chair)?: A Little Help needed to walk in hospital room?: A Little Help needed climbing 3-5 steps with a railing? : A Lot 6 Click Score: 17    End of Session Equipment Utilized During Treatment: Gait belt;Oxygen Activity Tolerance: Patient tolerated treatment well Patient left: in chair;with call bell/phone within reach;with chair alarm set Nurse Communication: Mobility status PT Visit Diagnosis: Unsteadiness on feet (R26.81);Muscle weakness (generalized) (M62.81);Pain Pain - Right/Left: Left Pain - part of body:  (abdomen)     Time: 1456-1530 PT Time Calculation (min) (ACUTE ONLY): 34 min  Charges:  $Gait Training: 8-22 mins $Therapeutic Exercise: 8-22 mins                     Bennye Nix B. Migdalia Dk PT, DPT Acute Rehabilitation Services Pager [phone] Office [phone]    [place] 03/26/2020, 3:46 PM

## 2020-03-26 NOTE — Progress Notes (Signed)
[facility] Progress Note  CC:  Pain LUQ, anemia  Subjective:  Developed CHF yesterday following EGD.  Much improved this AM.  Essentially only gastritis on EGD yesterday, biopsies pending for Hpylori.  Hgb stable this AM.  She says that she really does not have any abdominal pain right now.  Says that she has not moved her bowels in a couple of days.  Objective:  Vital signs in last 24 hours: Temp:  [96.3 F (35.7 C)-98.6 F (37 C)] 98.4 F (36.9 C) (08/19 7209) Pulse Rate:  [33-130] 83 (08/19 0633) Resp:  [14-34] 20 (08/19 4709) BP: (55-170)/(20-115) 96/80 (08/19 0633) SpO2:  [64 %-99 %] 94 % (08/19 0633) FiO2 (%):  [80 %-100 %] 80 % (08/18 1145) Weight:  [91.7 kg] 91.7 kg (08/19 0633) Last BM Date: 03/22/20 General:  Alert, Well-developed,  in NAD Heart:  Regular rate and rhythm; no murmurs Pulm:  CTAB.  No W/R/R. Abdomen:  Soft, non-distended.  BS present.  Non-tender. Extremities:  Without edema. Neurologic:  Alert and oriented x 4;  grossly normal neurologically. Psych:  Alert and cooperative. Normal mood and affect.  Intake/Output from previous day: 08/18 0701 - 08/19 0700 In: 764.9 [P.O.:480; I.V.:100; IV Piggyback:184.9] Out: 3475 [Urine:3475]  Lab Results: Recent Labs    03/24/20 0647 03/25/20 1405 03/26/20 0500  WBC 6.2 8.1 5.9  HGB 10.0* 12.1 10.3*  HCT 32.4* 38.4 32.3*  PLT 162 229 183   BMET Recent Labs    03/24/20 1042 03/25/20 1405 03/26/20 0500  NA 137 138 135  K 4.0 4.3 3.5  CL 98 95* 89*  CO2 31 32 35*  GLUCOSE 119* 108* 119*  BUN 22 24* 29*  CREATININE 1.32* 1.38* 1.58*  CALCIUM 9.2 9.4 9.1   LFT Recent Labs    03/23/20 1155  PROT 5.4*  ALBUMIN 2.6*  AST 15  ALT 8  ALKPHOS 47  BILITOT 0.9  BILIDIR <0.1  IBILI NOT CALCULATED   DG Abd 1 View  Result Date: 03/24/2020 CLINICAL DATA:  Abdominal pain, decreased bowel sounds EXAM: ABDOMEN - 1 VIEW COMPARISON:  CT abdomen/pelvis dated 03/21/2020 FINDINGS:  Nonobstructive bowel gas pattern. Degenerative changes of the lumbar spine. Mild degenerative changes of the bilateral hips, symmetric. Visualized bony pelvis appears intact. IMPRESSION: Unremarkable abdominal radiograph. Electronically Signed   By: Julian Hy M.D.   On: 03/24/2020 10:25   DG CHEST PORT 1 VIEW  Result Date: 03/25/2020 CLINICAL DATA:  Sudden onset shortness of breath after endoscopic procedure this morning EXAM: PORTABLE CHEST 1 VIEW COMPARISON:  Six days ago FINDINGS: Chronic cardiomegaly. CABG. Diffuse hazy pulmonary opacity which is increased from before. Trace left pleural effusion. No visible pneumothorax. Artifact from support hardware. IMPRESSION: Extensive airspace disease with small left pleural effusion, likely edema given priors. Aspiration is also considered in this clinical setting. Electronically Signed   By: Monte Fantasia M.D.   On: 03/25/2020 09:31   Assessment / Plan: *Anemia,normocytic.  Iron studies c/w AOCD.  Hgb stable at 10.3 grams this AM.  EGD on 8/18 showed the following:  - Erythematous, eroded and nodular mucosa in the antrum and prepyloric region of the stomach. No other gross lesions in the stomach. Biopsied for HP.  Biopsies pending.  * Left abd pain. Sounds like it may be related to constipation vs musculoskeletal. Noncontrast CTAP reassuring.  Really no pain this AM.  * NSTEMI. Previous CABG. Peripheral vascular disease, 1999 L to Rfemfembypass graft no, 2013 carotid to  subclavian bypass.  On Plavix, ASA.    * AKI.   *  Acute on chronic combined systolic/diastolic congestive heart failure-patient developed CHF yesterday following EGD.  Much improved this AM.  -Continue pantoprazole 40 mg BID x 8 weeks then daily indefinitely. -Continue Hgb.  No colonoscopy unless she becomes transfusion dependent. -Will add Miralax daily for bowel regimen as she says that she has not moved her bowels in a couple of days.   LOS: 8  days   Laban Emperor. Zyier Dykema  03/26/2020, 8:54 AM

## 2020-03-27 ENCOUNTER — Telehealth: Payer: Self-pay | Admitting: Cardiovascular Disease

## 2020-03-27 ENCOUNTER — Encounter (HOSPITAL_COMMUNITY): Payer: Self-pay | Admitting: Gastroenterology

## 2020-03-27 ENCOUNTER — Other Ambulatory Visit: Payer: Self-pay

## 2020-03-27 DIAGNOSIS — R0902 Hypoxemia: Secondary | ICD-10-CM

## 2020-03-27 LAB — CBC
HCT: 32.4 % — ABNORMAL LOW (ref 36.0–46.0)
Hemoglobin: 10.4 g/dL — ABNORMAL LOW (ref 12.0–15.0)
MCH: 27.3 pg (ref 26.0–34.0)
MCHC: 32.1 g/dL (ref 30.0–36.0)
MCV: 85 fL (ref 80.0–100.0)
Platelets: 232 10*3/uL (ref 150–400)
RBC: 3.81 MIL/uL — ABNORMAL LOW (ref 3.87–5.11)
RDW: 13 % (ref 11.5–15.5)
WBC: 7.6 10*3/uL (ref 4.0–10.5)
nRBC: 0 % (ref 0.0–0.2)

## 2020-03-27 LAB — BASIC METABOLIC PANEL
Anion gap: 11 (ref 5–15)
BUN: 35 mg/dL — ABNORMAL HIGH (ref 8–23)
CO2: 33 mmol/L — ABNORMAL HIGH (ref 22–32)
Calcium: 9 mg/dL (ref 8.9–10.3)
Chloride: 88 mmol/L — ABNORMAL LOW (ref 98–111)
Creatinine, Ser: 1.81 mg/dL — ABNORMAL HIGH (ref 0.44–1.00)
GFR calc Af Amer: 30 mL/min — ABNORMAL LOW (ref >60)
GFR calc non Af Amer: 26 mL/min — ABNORMAL LOW (ref >60)
Glucose, Bld: 124 mg/dL — ABNORMAL HIGH (ref 70–99)
Potassium: 3.7 mmol/L (ref 3.5–5.1)
Sodium: 132 mmol/L — ABNORMAL LOW (ref 135–145)

## 2020-03-27 LAB — CULTURE, BLOOD (ROUTINE X 2)
Culture: NO GROWTH
Culture: NO GROWTH
Special Requests: ADEQUATE
Special Requests: ADEQUATE

## 2020-03-27 LAB — MAGNESIUM: Magnesium: 2.3 mg/dL (ref 1.7–2.4)

## 2020-03-27 MED ORDER — METOPROLOL SUCCINATE ER 25 MG PO TB24
25.0000 mg | ORAL_TABLET | Freq: Every day | ORAL | Status: DC
  Administered 2020-03-28 – 2020-04-01 (×5): 25 mg via ORAL
  Filled 2020-03-27 (×5): qty 1

## 2020-03-27 MED ORDER — FUROSEMIDE 40 MG PO TABS
40.0000 mg | ORAL_TABLET | Freq: Every day | ORAL | Status: DC
  Administered 2020-03-27 – 2020-04-01 (×6): 40 mg via ORAL
  Filled 2020-03-27 (×6): qty 1

## 2020-03-27 MED ORDER — METOPROLOL TARTRATE 12.5 MG HALF TABLET
12.5000 mg | ORAL_TABLET | Freq: Two times a day (BID) | ORAL | Status: AC
  Administered 2020-03-27: 12.5 mg via ORAL
  Filled 2020-03-27: qty 1

## 2020-03-27 MED ORDER — MELATONIN 3 MG PO TABS
6.0000 mg | ORAL_TABLET | Freq: Every day | ORAL | Status: DC
  Administered 2020-03-27 – 2020-03-31 (×5): 6 mg via ORAL
  Filled 2020-03-27 (×5): qty 2

## 2020-03-27 NOTE — Care Management Important Message (Signed)
Important Message  Patient Details  Name: ZAYLIA RIOLO MRN: [id number] Date of Birth: Apr 17, 1941   Medicare Important Message Given:  Yes     Shelda Altes 03/27/2020, 12:04 PM

## 2020-03-27 NOTE — Progress Notes (Signed)
CARDIAC REHAB PHASE I   PRE:  Rate/Rhythm: 80 SR  BP:  Supine:   Sitting: 126/115 leg  Standing:    SaO2: 96% 4L  MODE:  Ambulation: around bed to recliner ft   POST:  Rate/Rhythm: 87 SR PVCs  BP:  Supine:   Sitting: 142/97 leg  Standing:    SaO2: 96% 4L 1342-1410 Pt walked around bed and to recliner on 4L with rolling walker and asst x 2. Encouraged to take bigger steps as she was taking tiny steps. Unsteady when sitting in recliner. Daughter in room . Pt with call bell. Encouraged pt to sit up for meals. Discussed importance of plavix with stent with daughter. Pt needs assistance when up. Chair alarm on.    Graylon Good, RN BSN  03/27/2020 2:05 PM

## 2020-03-27 NOTE — Consult Note (Signed)
   [facility] Inpatient Consult   03/27/2020  Deanna [name] 1941-04-23 [id number]  Update [facility] Care Management status to currently "Not Active".  Will follow up for appropriate care management needs at transition.  Natividad Brood, RN BSN [facility] Liaison  [phone] business mobile phone Toll free office [phone]  Fax number: [phone] [email or web address]

## 2020-03-27 NOTE — Progress Notes (Addendum)
Progress Note  Patient Name: Deanna [name] Date of Encounter: 03/27/2020  [facility] Cardiologist: Quay Burow, MD   Subjective   No CP or dyspnea.  Inpatient Medications    Scheduled Meds: . aspirin EC  81 mg Oral Daily  . brimonidine  1 drop Both Eyes BID  . Chlorhexidine Gluconate Cloth  6 each Topical Daily  . clopidogrel  75 mg Oral Daily  . divalproex  250 mg Oral BID  . dorzolamide  1 drop Both Eyes BID  . escitalopram  20 mg Oral Daily  . furosemide  40 mg Intravenous BID  . heparin injection (subcutaneous)  5,000 Units Subcutaneous Q8H  . latanoprost  1 drop Both Eyes QHS  . levothyroxine  188 mcg Oral Q0600  . magnesium hydroxide  15 mL Oral Daily  . metoprolol tartrate  12.5 mg Oral BID  . mirabegron ER  50 mg Oral Daily  . pantoprazole  40 mg Oral BID AC  . polyethylene glycol  17 g Oral Daily  . rosuvastatin  40 mg Oral Daily  . sodium chloride flush  3 mL Intravenous Q12H  . sodium chloride flush  3 mL Intravenous Q12H  . vitamin B-12  1,000 mcg Oral Daily   Continuous Infusions: . sodium chloride Stopped (03/22/20 0627)  . sodium chloride    . sodium chloride    . sodium chloride     PRN Meds: sodium chloride, Place/Maintain arterial line **AND** sodium chloride, acetaminophen, acetaminophen, albuterol, gabapentin, nitroGLYCERIN, ondansetron (ZOFRAN) IV, sodium chloride flush, tiZANidine   Vital Signs    Vitals:   03/26/20 1937 03/27/20 0059 03/27/20 0605 03/27/20 0825  BP: 127/77 (!) 103/57 128/74 112/90  Pulse: 89 84 80 87  Resp: 16 18 14 16   Temp:  98.9 F (37.2 C) 98.1 F (36.7 C) 98.8 F (37.1 C)  TempSrc:  Oral Oral Oral  SpO2: 98% 92% 93% 97%  Weight:      Height:        Intake/Output Summary (Last 24 hours) at 03/27/2020 0957 Last data filed at 03/27/2020 0558 Gross per 24 hour  Intake 540 ml  Output 2250 ml  Net -1710 ml   Last 3 Weights 03/26/2020 03/25/2020 03/25/2020  Weight (lbs) 202 lb 2.6 oz 212 lb 8.4 oz 212 lb  8.4 oz  Weight (kg) 91.7 kg 96.4 kg 96.4 kg  Some encounter information is confidential and restricted. Go to Review Flowsheets activity to see all data.      Telemetry    Sinus - Personally Reviewed  Physical Exam   GEN: No acute distress.   Neck: No JVD Cardiac: RRR, no murmurs, rubs, or gallops.  Respiratory:  Mildly diminished breath sounds bases. GI: Soft, nontender, non-distended  MS: No edema Neuro:  Nonfocal  Psych: Normal affect   Labs    High Sensitivity Troponin:   Recent Labs  Lab 03/18/20 1737 03/18/20 2248 03/19/20 0502 03/25/20 1405 03/25/20 2147  TROPONINIHS 4,011* 3,425* 3,370* 496* 463*      Chemistry Recent Labs  Lab 03/23/20 1155 03/24/20 1042 03/25/20 1405 03/26/20 0500 03/27/20 0438  NA  --    < > 138 135 132*  K  --    < > 4.3 3.5 3.7  CL  --    < > 95* 89* 88*  CO2  --    < > 32 35* 33*  GLUCOSE  --    < > 108* 119* 124*  BUN  --    < >  24* 29* 35*  CREATININE  --    < > 1.38* 1.58* 1.81*  CALCIUM  --    < > 9.4 9.1 9.0  PROT 5.4*  --   --   --   --   ALBUMIN 2.6*  --   --   --   --   AST 15  --   --   --   --   ALT 8  --   --   --   --   ALKPHOS 47  --   --   --   --   BILITOT 0.9  --   --   --   --   GFRNONAA  --    < > 37* 31* 26*  GFRAA  --    < > 42* 36* 30*  ANIONGAP  --    < > 11 11 11    < > = values in this interval not displayed.     Hematology Recent Labs  Lab 03/25/20 1405 03/26/20 0500 03/27/20 0438  WBC 8.1 5.9 7.6  RBC 4.41 3.74* 3.81*  HGB 12.1 10.3* 10.4*  HCT 38.4 32.3* 32.4*  MCV 87.1 86.4 85.0  MCH 27.4 27.5 27.3  MCHC 31.5 31.9 32.1  RDW 13.2 12.9 13.0  PLT 229 183 232    BNP Recent Labs  Lab 03/21/20 0345 03/25/20 1405  BNP 656.6* 1,834.3*     Radiology    No results found.  Patient Profile     79 y.o. female with a history of CAD s/p CABG in 1999, PAD with right fem-fem bypass and left subclavian stenosis s/p LSCA to left carotid bypass, hypertension, and hyperlipidemia who was  admitted on 03/18/2020 with NSTEMI.  Echocardiogram showed ejection fraction 35 to 44%, grade 2 diastolic dysfunction, akinesis of the anteroseptal wall and apex, mild mitral regurgitation.  Cardiac catheterization revealed 99% proximal LAD with atretic LIMA to the LAD, 100% mid LAD, occluded circumflex, occluded right coronary artery.  Patient had PCI of the proximal LAD but mid LAD unsuccessful.  Medical therapy recommended for her residual coronary disease and for CHF.  Patient developed CHF following EGD yesterday.  Was felt to be hypotensive and placed on pressors but then noted to have left subclavian stenosis.  Blood pressures should be taken in legs.  Assessment & Plan    1 acute on chronic combined systolic/diastolic congestive heart failure-patient developed CHF yesterday following EGD.  She is much improved this morning. Negative 1.7 L overnight, Crea and BUN up this morning, I will switch to PO Lasix 40 mg daily, previously none at home, she appears almost euvolemic.She desaturates with minimal exertion, we will place a home O2 evaluation.  She can be discharged from cardiac standpoint, we will arrange for an outpatient follow up within 7-14 days with labs.   2 non-ST elevation myocardial infarction-patient is status post PCI of the proximal LAD but attempt at mid LAD unsuccessful.  Plan is medical therapy.  Continue aspirin, Plavix and statin.  Resume metoprolol 12.5 mg twice daily.  Transition to Toprol at time of discharge once dose stable.  3 ischemic cardiomyopathy-we will resume low-dose beta-blocker and transition to Toprol at discharge as outlined.  Will resume ARB tomorrow if renal function and blood pressure stable.  4 peripheral vascular disease-patient has known left subclavian stenosis.  Continue aspirin and statin.  Blood pressure should not be taken in left upper extremity (patient apparently was documented to have hypotension yesterday but pressure taken from  left upper  extremity).  5 hyperlipidemia-continue statin.  6 chronic stage III kidney disease-follow renal function closely in-house.  7 abdominal pain-EGD yesterday showed mild gastritis.  Hemoglobin stable.  Need to begin ambulating and weaning O2.  For questions or updates, please contact [place] Please consult [email or web address] for contact info under    Signed, Ena Dawley, MD  03/27/2020, 9:57 AM

## 2020-03-27 NOTE — Telephone Encounter (Signed)
Per Roby Lofts, a TOC follow up appointment has been scheduled for 04/09/20 at 2:15 PM with Coletta Memos.

## 2020-03-27 NOTE — Discharge Summary (Addendum)
Discharge Summary    Patient ID: Deanna [name],  MRN: [id number], DOB/AGE: 11/14/1940 79 y.o.  Admit date: 03/18/2020 Discharge date: 04/01/2020  Primary Care Provider: Lauree Chandler Primary Cardiologist: Quay Burow, MD  Discharge Diagnoses    Principal Problem:   NSTEMI (non-ST elevated myocardial infarction) 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 Donepezil Other (See Comments)    Altered mood, aggression, and caused anger  . Duloxetine Hcl     Increased confusion and memory concerns   . Meloxicam     Pt suspects that this medicine causes fluid on her lungs.   . Memantine Other (See Comments)    Caused severe aggression  . 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 Vicodin [Hydrocodone-Acetaminophen] Other (See Comments)    Delirium, confusion, and toxic dementia    Diagnostic Studies/Procedures    Left heart catheterization 03/19/20:  Ost LAD to Prox LAD lesion is 99% stenosed. LIMA to LAD known to be atretic.  After orbital atherectomy, A drug-eluting stent was successfully placed using a STENT RESOLUTE ONYX 3.5X15.  Post intervention, there is a 0% residual stenosis.  Mid LAD lesion is 100% stenosed. Despite atherectomy and angioplasty, flow could not be restored.  Mid Cx lesion is 100% stenosed. SVG to OM patent.  Prox RCA lesion is 100% stenosed. SVG to PDA occluded.  There is moderate left ventricular systolic dysfunction.  LV end diastolic pressure is moderately elevated.  The left ventricular ejection fraction is 25-35% by visual  estimate.  There is no aortic valve stenosis.  Balloon angioplasty was performed using a BALLOON SAPPHIRE 2.0X12.   Complex intervention due to severe three-vessel coronary artery disease with prior bypass surgery.  Successful atherectomy and stenting of the proximal LAD.  Unable to restore flow through the mid LAD.  I suspect, she is a somewhat late presenting infarct.  She already has evidence of heart failure with elevated LVEDP.  Will need ongoing medical therapy to help her LV dysfunction.   Echocardiogram 03/19/20: 1. Left ventricular ejection fraction, by estimation, is 35 to 40%. The  left ventricle has moderately decreased function. The left ventricle  demonstrates regional wall motion abnormalities (see scoring  diagram/findings for description). Left ventricular  diastolic parameters are consistent with Grade II diastolic dysfunction  (pseudonormalization). There is severe akinesis of the left ventricular,  mid-apical anteroseptal wall.  2. Right ventricular systolic function is mildly reduced. The right  ventricular size is normal.  3. The mitral valve is grossly normal. Mild mitral valve regurgitation.  4. The aortic valve is normal in structure. Aortic valve regurgitation is  not visualized. No aortic stenosis is present.   EGD 03/25/20: - No gross lesions in esophagus. - Z-line regular, 38 cm from the incisors. - Erythematous, eroded and nodular mucosa in the antrum and prepyloric region of the stomach. No other gross lesions in the stomach. Biopsied for HP. - No gross lesions in the duodenal bulb, in the first portion of the duodenum and in the second portion of the duodenum.  RECOMMENDATIONS: - The  patient will be observed post-procedure, until all discharge criteria are met. - Discharge patient to home. - Patient has a contact number available for emergencies. The signs and symptoms of potential delayed complications were discussed with the patient. Return to  normal activities tomorrow. Written discharge instructions were provided to the patient. - Resume regular diet. - Continue present medications. - Protonix 40 mg twice daily for next 66-month and then decrease to once daily and maintain thereafter. - Observe patient's clinical course. - Her LUQ abdominal discomfort is not clearly delineated as GI related. Query with the rotational movement causing her issues, suspect that this is more MSK related. - Unless patient has a transfusion dependent anemia persist would hold on colonoscopy for evaluation of anemia. - Recommend IV Iron while in house. _____________   History of Present Illness     Deanna DENIOis a 79y.o. female with hx CAD, [place], has had cath since, PAD with Lt to Rt femfem crossover, also has p Lt subclavian artery stenosis, Lt common carotid to subclavian artery bypass, HTN, HLD, + FH of CAD, who presented with chest pain and was found to have elevated troponins.   Her last cath 2013 via Lt common femoral arthery below fem-fem bypass anastomosis  with severe native disease, [place] to LAD atretic with occluded Lt SCA, VG to diag patent, VG-OM patent VG to RCA 100% thrombotic, grumous filled through 1/2 of coursel; Large readiopaque filling defect just prior to occlusion site.-- Not safe for PCI for fear of distal embolization.  Widely patent Left common and external Iliac with brisk flow through the L-R fem-fem bypass graft.      Last PV angio, 2019 with PTA to rt. 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 Echo 11/15/17  With EF 65-70%, G1DD, mild TR.  Now admitted with chest pain, SOB , back pain as well.  Chest pain is a burning pain.  Her back pain has increased as well.   The ECG that was done 03/18/20 with ST  at 103, flattened T waves inf. Lat and on later EKGs increase of ST depression was personally reviewed.   Na 139, K+ 4.0 glucose 152, Cr 1.24   Troponin 3,720  Hgb 12.4, Wbc 7.1 pts [place]    Consultants: GI, PCCM  1. NSTEMI in patient with CAD s/p CABG: patient presented with chest pain, found to have HsTrop elevated to 4011 and trended down. EKG was without ischemic changes. She underwent a LHC 03/19/20 with complex atherectomy and DES to pLAD, though unable to restore flow through the mid LAD. She was recommended to continue DAPT uninterrupted for 1 year.   - Continue aspirin and plavix - Continue BBlocker - Continue statin  2. 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 She was diuresed with IV lasix and transitioned to po lasix 484mdaily with UOP net -7L this admission. Weight on the day of discharge was 90.4 kg. - Farxiga to be added to patient's medication regimen. Risk and benefits discussed including risk of UTI's/Yeast Infections - metoprolol changed from tartrate to succinate - decreased dose of valsartan to 40 mg daily due to borderline blood pressures - Continue lasix 40  mg daily until she is seen for follow up in cardiology clinic, at which time it can be change to 40 mg daily as needed if appropriate - Amlodipine held due to borderline low blood pressure - Low sodium diet encouraged.  - counseled on need for daily weights, 2L fluid restriction  3. Hypoxia: patient required intermittent BiPAP, weaned to HFNC. Ambulatory O2 sats were 83% on RA despite adequate diuresis. Decision made to pursue home O2. Patient to attend SNF for further rehabilitation. Patient ambulating with less frequent episodes of shortness of breath. Currently O2 sat of 96% on 3L [place].   4. Paroxysmal atrial fibrillation: hospital course was complicated by a brief episode of atrial fibrillation following heart  catheterization, converted to NSR with amiodarone. Amiodarone subsequently stopped and patient did not have any recurrent atrial fibrillation. Given first episode and brief duration, anticoagulation was not initiated.  - Continue metoprolol for rate control - Continue to monitor for recurrence   5. Abdominal pain: GI consulted to evaluate LUQ pain. CT A/P was notable for colonic tortuosity, moderate stool burden, and diverticulosis without diverticulitis with benign liver, pancrease, and biliary tree. She underwent a diagnostic EGD 03/25/20 which showed erythematous, eroded, and nodular mucosa in the antrum and prepyloric region of the stomach, biopsied for HP with results pending. She was recommended to continue pantoprazole 18m BID x 8 weeks, then take daily. Her post EGD course was complicated by acute hypoxic respiratory failure briefly requiring BiPAP and pressor support which subsequently resolved with diuresis.  - Continue pantoprazole 414mBID x 8 weeks, then daily going forwards.  - GI to arrange outpatient follow-up.  ___________  Discharge Vitals Blood pressure 110/66, pulse 75, temperature 98.2 F (36.8 C), temperature source Oral, resp. rate 16, height '5\' 2"'  (1.575 m), weight 90.4 kg, SpO2 96 %.  Filed Weights   03/29/20 0430 03/31/20 1224 04/01/20 0500  Weight: 91.2 kg 89.1 kg 90.4 kg   GEN: Well nourished, well developed in no acute distress. [place] in place NECK: No JVD visible CARDIAC: regular rhythm, normal S1 and S2, no rubs or gallops. 2/6 left upper chest systolic murmur consistent with her subclavian stenosis. VASCULAR: Radial pulses 2+ bilaterally.  RESPIRATORY:  Mildly diminished, no wheezing ABDOMEN: Soft, non-tender, non-distended MUSCULOSKELETAL:  Moves all 4 limbs independently SKIN: Warm and dry, no edema NEUROLOGIC:  No focal neuro deficits noted. PSYCHIATRIC:  Normal affect   Labs & Radiologic Studies    CBC Recent Labs    03/31/20 0704 04/01/20 0839  WBC  6.2 6.4  HGB 10.5* 10.3*  HCT 33.3* 32.7*  MCV 86.5 87.4  PLT 248 23737 Basic Metabolic Panel Recent Labs    03/30/20 0730 03/31/20 0704  NA  --  138  K  --  4.2  CL  --  94*  CO2  --  31  GLUCOSE  --  123*  BUN  --  32*  CREATININE  --  1.52*  CALCIUM  --  9.6  MG 2.5*  --    Liver Function Tests No results for input(s): AST, ALT, ALKPHOS, BILITOT, PROT, ALBUMIN in the last 72 hours. No results for input(s): LIPASE, AMYLASE in the last 72 hours. Cardiac Enzymes No results for input(s): CKTOTAL, CKMB, CKMBINDEX, TROPONINI in the last 72 hours. BNP Invalid input(s): POCBNP D-Dimer No results for input(s): DDIMER in the last 72 hours. Hemoglobin A1C No results for input(s): HGBA1C in the last 72 hours. Fasting Lipid Panel No results for input(s): CHOL,  HDL, LDLCALC, TRIG, CHOLHDL, LDLDIRECT in the last 72 hours. Thyroid Function Tests No results for input(s): TSH, T4TOTAL, T3FREE, THYROIDAB in the last 72 hours.  Invalid input(s): FREET3 _____________  CT ABDOMEN PELVIS WO CONTRAST  Result Date: 03/21/2020 CLINICAL DATA:  Retroperitoneal hematoma suspected. Cardiac catheterization 2 days ago. EXAM: CT ABDOMEN AND PELVIS WITHOUT CONTRAST TECHNIQUE: Multidetector CT imaging of the abdomen and pelvis was performed following the standard protocol without IV contrast. COMPARISON:  Chest radiograph 03/19/2020.  Abdominal CT 04/27/2015 FINDINGS: Lower chest: Small bilateral pleural effusions and basilar atelectasis. Mild basilar septal thickening suspicious for pulmonary edema. Coronary artery calcifications/stents. Mild cardiomegaly. Hepatobiliary: No evidence of focal hepatic abnormality on noncontrast exam. Unremarkable gallbladder. No biliary dilatation. Pancreas: No ductal dilatation or inflammation. Spleen: Normal in size. No definite focal abnormality on noncontrast exam. Adrenals/Urinary Tract: No adrenal nodule or evidence of hemorrhage. Mild symmetric perinephric edema is  similar to that seen on prior exam. There is no hydronephrosis. There is a 3.6 cm simple cyst in the lower right kidney. No renal calculi. Urinary bladder is partially distended. No bladder wall thickening. Stomach/Bowel: Tiny hiatal hernia. Ingested material within the stomach. There is no small bowel obstruction or inflammatory change. Appendix tentatively visualized and normal. Colonic tortuosity with moderate colonic stool burden. Mild diverticulosis involving the distal descending and sigmoid colon. No diverticulitis. Stool in the rectum. Vascular/Lymphatic: Dense aortic atherosclerosis. No aortic aneurysm. There is mild peripheral branch atherosclerosis. Femoral femoral bypass graft. Small amount of simple fluid adjacent to the midportion of the graft. There is stranding adjacent to the left femoral vessels but no confluent hematoma. There is no retroperitoneal stranding extending into the abdominopelvic cavity. Reproductive: Heterogeneous uterus with calcification along the anterior body on the left, unchanged. Ovaries appear quiescent. No suspicious adnexal mass. Other: No fluid or retroperitoneal stranding to suggest retroperitoneal hematoma. No stranding of the abdominal wall musculature. There is no intra-abdominal ascites. No free air. Tiny fat containing umbilical hernia. Scattered peripherally calcified densities in the subcutaneous tissues likely granulomas. Musculoskeletal: Degenerative change involving the spine and both hips. There are no acute or suspicious osseous abnormalities. IMPRESSION: 1. No evidence of retroperitoneal hematoma. Mild stranding in the left groin adjacent to the femoral vessels but no confluent hematoma. There is no retroperitoneal stranding extending into the abdominopelvic cavity. 2. Small amount of simple fluid adjacent to the midportion of the left femoral bypass graft, nonspecific, increased from 2016 but simple fluid density. 3. Small bilateral pleural effusions and  atelectasis. Basilar septal thickening suspicious for pulmonary edema. 4. Colonic tortuosity with moderate colonic stool burden, can be seen with constipation. Colonic diverticulosis without diverticulitis. Aortic Atherosclerosis (ICD10-I70.0). Electronically Signed   By: Keith Rake M.D.   On: 03/21/2020 22:26   DG Abd 1 View  Result Date: 03/24/2020 CLINICAL DATA:  Abdominal pain, decreased bowel sounds EXAM: ABDOMEN - 1 VIEW COMPARISON:  CT abdomen/pelvis dated 03/21/2020 FINDINGS: Nonobstructive bowel gas pattern. Degenerative changes of the lumbar spine. Mild degenerative changes of the bilateral hips, symmetric. Visualized bony pelvis appears intact. IMPRESSION: Unremarkable abdominal radiograph. Electronically Signed   By: Julian Hy M.D.   On: 03/24/2020 10:25   CARDIAC CATHETERIZATION  Result Date: 03/19/2020  Ost LAD to Prox LAD lesion is 99% stenosed. LIMA to LAD known to be atretic.  After orbital atherectomy, A drug-eluting stent was successfully placed using a STENT RESOLUTE ONYX 3.5X15.  Post intervention, there is a 0% residual stenosis.  Mid LAD lesion is 100% stenosed. Despite atherectomy  and angioplasty, flow could not be restored.  Mid Cx lesion is 100% stenosed. SVG to OM patent.  Prox RCA lesion is 100% stenosed. SVG to PDA occluded.  There is moderate left ventricular systolic dysfunction.  LV end diastolic pressure is moderately elevated.  The left ventricular ejection fraction is 25-35% by visual estimate.  There is no aortic valve stenosis.  Balloon angioplasty was performed using a BALLOON SAPPHIRE 2.0X12.  Complex intervention due to severe three-vessel coronary artery disease with prior bypass surgery.  Successful atherectomy and stenting of the proximal LAD.  Unable to restore flow through the mid LAD.  I suspect, she is a somewhat late presenting infarct.  She already has evidence of heart failure with elevated LVEDP.  Will need ongoing medical therapy to  help her LV dysfunction.   DG CHEST PORT 1 VIEW  Result Date: 03/25/2020 CLINICAL DATA:  Sudden onset shortness of breath after endoscopic procedure this morning EXAM: PORTABLE CHEST 1 VIEW COMPARISON:  Six days ago FINDINGS: Chronic cardiomegaly. CABG. Diffuse hazy pulmonary opacity which is increased from before. Trace left pleural effusion. No visible pneumothorax. Artifact from support hardware. IMPRESSION: Extensive airspace disease with small left pleural effusion, likely edema given priors. Aspiration is also considered in this clinical setting. Electronically Signed   By: Monte Fantasia M.D.   On: 03/25/2020 09:31   DG CHEST PORT 1 VIEW  Result Date: 03/19/2020 CLINICAL DATA:  Hypoxia.  Congestive heart failure. EXAM: PORTABLE CHEST 1 VIEW COMPARISON:  One-view chest x-ray 03/18/2020 FINDINGS: Heart size upper limits of normal. CABG is noted. Atherosclerotic changes are present at the aortic arch. Diffuse interstitial pattern is increasing. Bilateral effusions are present, left greater than right. Mild bibasilar opacities are associated. Advanced degenerative changes are noted in both shoulders, right greater than left. IMPRESSION: 1. Increasing interstitial pattern compatible with congestive heart failure. 2. Bilateral pleural effusions, left greater than right. 3. Mild bibasilar opacities likely reflect atelectasis. Electronically Signed   By: San Morelle M.D.   On: 03/19/2020 07:13   DG Chest Portable 1 View  Result Date: 03/18/2020 CLINICAL DATA:  Shortness of breath EXAM: PORTABLE CHEST 1 VIEW COMPARISON:  11/14/2017 FINDINGS: Prior CABG. Heart is normal size. No confluent opacities, effusions or edema. No acute bony abnormality. IMPRESSION: No active disease. Electronically Signed   By: Rolm Baptise M.D.   On: 03/18/2020 18:06   ECHOCARDIOGRAM COMPLETE  Result Date: 03/19/2020    ECHOCARDIOGRAM REPORT   Patient Name:   ATAVIA POPPE Date of Exam: 03/19/2020 Medical Rec #:   [id number]       Height:       62.0 in Accession #:    [id number]      Weight:       210.5 lb Date of Birth:  Feb 09, 1941       BSA:          1.954 m Patient Age:    89 years        BP:           91/47 mmHg Patient Gender: F               HR:           89 bpm. Exam Location:  Inpatient Procedure: 2D Echo, Cardiac Doppler, Color Doppler and Intracardiac            Opacification Agent Indications:    NSTEMI I21.4  History:        Patient has no prior  history of Echocardiogram examinations.                 CHF, Previous Myocardial Infarction and CAD; Risk                 Factors:Hypertension, Dyslipidemia and Sleep Apnea. Sepsis.                 Hypothyroidism. GERD.  Sonographer:    Jonelle Sidle Dance Referring Phys: [place]  1. Left ventricular ejection fraction, by estimation, is 35 to 40%. The left ventricle has moderately decreased function. The left ventricle demonstrates regional wall motion abnormalities (see scoring diagram/findings for description). Left ventricular  diastolic parameters are consistent with Grade II diastolic dysfunction (pseudonormalization). There is severe akinesis of the left ventricular, mid-apical anteroseptal wall.  2. Right ventricular systolic function is mildly reduced. The right ventricular size is normal.  3. The mitral valve is grossly normal. Mild mitral valve regurgitation.  4. The aortic valve is normal in structure. Aortic valve regurgitation is not visualized. No aortic stenosis is present. FINDINGS  Left Ventricle: Left ventricular ejection fraction, by estimation, is 35 to 40%. The left ventricle has moderately decreased function. The left ventricle demonstrates regional wall motion abnormalities. Severe akinesis of the left ventricular, mid-apical anteroseptal wall. Definity contrast agent was given IV to delineate the left ventricular endocardial borders. The left ventricular internal cavity size was normal in size. There is no left ventricular  hypertrophy. Left ventricular diastolic parameters are consistent with Grade II diastolic dysfunction (pseudonormalization). Right Ventricle: The right ventricular size is normal. No increase in right ventricular wall thickness. Right ventricular systolic function is mildly reduced. Left Atrium: Left atrial size was normal in size. Right Atrium: Right atrial size was normal in size. Pericardium: There is no evidence of pericardial effusion. Mitral Valve: The mitral valve is grossly normal. Mild mitral valve regurgitation. Tricuspid Valve: The tricuspid valve is normal in structure. Tricuspid valve regurgitation is trivial. Aortic Valve: The aortic valve is normal in structure. Aortic valve regurgitation is not visualized. No aortic stenosis is present. Pulmonic Valve: The pulmonic valve was normal in structure. Pulmonic valve regurgitation is not visualized. Aorta: The aortic root and ascending aorta are structurally normal, with no evidence of dilitation. IAS/Shunts: The atrial septum is grossly normal.  LEFT VENTRICLE PLAX 2D LVIDd:         4.70 cm  Diastology LVIDs:         3.50 cm  LV e' lateral:   6.20 cm/s LV PW:         1.10 cm  LV E/e' lateral: 20.8 LV IVS:        0.90 cm  LV e' medial:    5.64 cm/s LVOT diam:     1.70 cm  LV E/e' medial:  22.9 LV SV:         31 LV SV Index:   16 LVOT Area:     2.27 cm  RIGHT VENTRICLE            IVC RV Basal diam:  2.60 cm    IVC diam: 1.90 cm RV S prime:     8.08 cm/s TAPSE (M-mode): 1.5 cm LEFT ATRIUM             Index       RIGHT ATRIUM           Index LA diam:        4.50 cm 2.30 cm/m  RA Area:  11.20 cm LA Vol (A2C):   32.4 ml 16.58 ml/m RA Volume:   21.10 ml  10.80 ml/m LA Vol (A4C):   52.8 ml 27.03 ml/m LA Biplane Vol: 41.2 ml 21.09 ml/m  AORTIC VALVE LVOT Vmax:   71.60 cm/s LVOT Vmean:  51.300 cm/s LVOT VTI:    0.136 m  AORTA Ao Root diam: 3.10 cm Ao Asc diam:  3.10 cm MITRAL VALVE MV Area (PHT): 4.31 cm     SHUNTS MV Decel Time: 176 msec     Systemic  VTI:  0.14 m MV E velocity: 129.00 cm/s  Systemic Diam: 1.70 cm MV A velocity: 100.00 cm/s MV E/A ratio:  1.29 Mertie Moores MD Electronically signed by Mertie Moores MD Signature Date/Time: 03/19/2020/3:48:49 PM    Final    [place] EKG SITE RITE  Result Date: 03/21/2020 If Site Rite image not attached, placement could not be confirmed due to current cardiac rhythm.  Disposition   Patient was seen and examined, she is stable for discharge. Follow-up has been arranged. Discharge medications as listed below.   Follow-up Plans & Appointments     Contact information for follow-up providers    Deberah Pelton, NP Follow up on 04/09/2020.   Specialty: Cardiology Why: Please arrive 15 minutes early for your 2:15pm post-hospital cardiology follow-up appointment Contact information: [address] [place] [address] [phone]            Contact information for after-discharge care    Destination    [facility] Preferred SNF .   Service: Skilled Nursing Contact information: [place] [phone]                 Discharge Instructions    (HEART FAILURE PATIENTS) Call MD:  Anytime you have any of the following symptoms: 1) 3 pound weight gain in 24 hours or 5 pounds in 1 week 2) shortness of breath, with or without a dry hacking cough 3) swelling in the hands, feet or stomach 4) if you have to sleep on extra pillows at night in order to breathe.   Complete by: As directed    (HEART FAILURE PATIENTS) Call MD:  Anytime you have any of the following symptoms: 1) 3 pound weight gain in 24 hours or 5 pounds in 1 week 2) shortness of breath, with or without a dry hacking cough 3) swelling in the hands, feet or stomach 4) if you have to sleep on extra pillows at night in order to breathe.   Complete by: As directed    Amb Referral to Cardiac Rehabilitation   Complete by: As directed    Diagnosis:  NSTEMI Coronary Stents      After initial evaluation and assessments completed: Virtual Based Care may be provided alone or in conjunction with Phase 2 Cardiac Rehab based on patient barriers.: Yes   Call MD for:  difficulty breathing, headache or visual disturbances   Complete by: As directed    Call MD for:  persistant dizziness or light-headedness   Complete by: As directed    Call MD for:  persistant nausea and vomiting   Complete by: As directed    Call MD for:  redness, tenderness, or signs of infection (pain, swelling, redness, odor or green/yellow discharge around incision site)   Complete by: As directed    Call MD for:  severe uncontrolled pain   Complete by: As directed    Diet - low sodium heart healthy  Complete by: As directed    Diet - low sodium heart healthy   Complete by: As directed    Discharge instructions   Complete by: As directed    No driving for a week, do not life anything over 5 lbs for 5 days, no sexual activity for 1-2 weeks. Please do not soak area in water, showers are okay.   Increase activity slowly   Complete by: As directed    Increase activity slowly   Complete by: As directed       Discharge Medications   Allergies as of 04/01/2020      Reactions   Donepezil Other (See Comments)   Altered mood, aggression, and caused anger   Duloxetine Hcl    Increased confusion and memory concerns    Meloxicam    Pt suspects that this medicine causes fluid on her lungs.    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 1 by mouth in the morning and 1 by mouth in the pm.   ALPRAZolam 0.5 MG tablet Commonly known as: Xanax Take 1 tablet (0.5 mg total) by mouth daily as needed for anxiety.   aspirin EC 81 MG tablet Take 81 mg by mouth at bedtime.   brimonidine 0.2 % ophthalmic solution Commonly known as: ALPHAGAN Place 1 drop into both eyes 2 (two) times daily.   calcium carbonate 500 MG chewable tablet Commonly known as: TUMS - dosed in mg elemental calcium Chew 1 tablet by mouth daily as needed for indigestion or heartburn.   cholecalciferol 10 MCG (400 UNIT) Tabs tablet Commonly known as: VITAMIN D3 Take 400 Units by mouth daily.   clopidogrel 75 MG tablet Commonly known as: PLAVIX TAKE 1 TABLET (75 MG TOTAL) BY MOUTH DAILY.   dapagliflozin propanediol 10 MG Tabs tablet Commonly known as: FARXIGA Take 1 tablet (10 mg total) by mouth daily.   divalproex 250 MG 24 hr tablet Commonly known as: DEPAKOTE ER TAKE 1 TABLET(250 MG) BY MOUTH TWICE DAILY   dorzolamide 2 % ophthalmic solution Commonly known as: TRUSOPT Place 1 drop into both eyes 2 (two) times daily.   escitalopram 20 MG tablet Commonly known as: LEXAPRO TAKE 1 TABLET(20 MG) BY MOUTH DAILY What changed:   how much to take  how to take this  when to take this  additional instructions   furosemide 40 MG tablet Commonly known as: Lasix Take 1 tablet (40 mg total) by mouth daily. Please continue daily dosing until your follow up cardiology appt. What changed:   when to take this  reasons to take this  additional instructions   gabapentin 100 MG capsule Commonly known as: NEURONTIN Take 1 capsule (100 mg total) by mouth 2 (two) times daily as needed. What changed:   how much to take  reasons to take this   latanoprost 0.005 % ophthalmic solution Commonly known  as: XALATAN Place 1 drop into both eyes  at bedtime.   levothyroxine 100 MCG tablet Commonly known as: Synthroid Take 1 tablet (100 mcg total) by mouth daily before breakfast. To take with 88 mcg tablet to equal 188 mcg total   levothyroxine 88 MCG tablet Commonly known as: Synthroid Take 1 tablet (88 mcg total) by mouth daily before breakfast. To take with 100 mcg tablet to equal 188 mcg total   meclizine 25 MG tablet Commonly known as: ANTIVERT Take 1 tablet (25 mg total) by mouth as needed for dizziness.   metoprolol succinate 25 MG 24 hr tablet Commonly known as: TOPROL-XL Take 1 tablet (25 mg total) by mouth daily.   Myrbetriq 50 MG Tb24 tablet Generic drug: mirabegron ER Take 50 mg by mouth daily.   nitroGLYCERIN 0.4 MG SL tablet Commonly known as: NITROSTAT Place 1 tablet (0.4 mg total) under the tongue every 5 (five) minutes x 3 doses as needed for chest pain.   pantoprazole 40 MG tablet Commonly known as: PROTONIX TAKE 1 TABLET EVERY DAY   rosuvastatin 40 MG tablet Commonly known as: Crestor Take 1 tablet (40 mg total) by mouth daily.   tiZANidine 2 MG tablet Commonly known as: ZANAFLEX Take 1 tablet (2 mg total) by mouth every 6 (six) hours as needed for muscle spasms.   valsartan 40 MG tablet Commonly known as: DIOVAN Take 1 tablet (40 mg total) by mouth every morning. What changed:   medication strength  how much to take   vitamin B-12 1000 MCG tablet Commonly known as: CYANOCOBALAMIN Take 1,000 mcg by mouth daily.        Aspirin prescribed at discharge?  Yes High Intensity Statin Prescribed? (Lipitor 40-19m or Crestor 20-438m: Yes Beta Blocker Prescribed? Yes For EF <40%, was ACEI/ARB Prescribed? Yes ADP Receptor Inhibitor Prescribed? (i.e. Plavix etc.-Includes Medically Managed Patients): Yes For EF <40%, Aldosterone Inhibitor Prescribed? No:  Was EF assessed during THIS hospitalization? Yes Was Cardiac Rehab II ordered? (Included Medically managed Patients): Yes   Outstanding  Labs/Studies   None  Duration of Discharge Encounter   Greater than 30 minutes including physician time.  Signed, BrBuford Dresser8/25/2021, 9:48 AM

## 2020-03-27 NOTE — Progress Notes (Signed)
   Patient is still requiring HFNC 7L with documented O2 83% when ambulating to the bedside chair. Unclear what is causing her persistent hypoxia as she has diuresed well. Will likely need home O2. Daughter also expressed concerns about caring for patient at home. Given poor mobility requiring 2 assists to transition to chair with cardiac rehab today, will ask PT to re-evaluate for possible rehab consideration. OT order placed as well. Dr. Meda Coffee updated and in agreement with plan.  Abigail Butts, PA-C 03/27/20; 4:14 PM

## 2020-03-27 NOTE — Progress Notes (Signed)
Pt seemed very concerned about the mask and the O2 and she said she feels like she rather wear her home unit. Pt refused to wear cpap and said she will call her daughter in the morning to bring home unit. Advised pt to let the RN know if she changes her mind. RT will continue to monitor.

## 2020-03-27 NOTE — Consult Note (Signed)
   [facility] Inpatient Consult   03/27/2020  NAQUISHA WHITEHAIR 12-20-1940 [id number]  [place] [facility] [ACO] Patient:  [facility] Medicare   Patient has a history of  active with [place] [facility] for chronic disease management services in the past.  Patient has been engaged by a [facility] RN Care Coordinator and [facility] Social Worker in the past.  Plan: Will follow with Inpatient Transition Of Care [TOC] team member to check for [place] [facility] needs fos post hospitalization.  Will update [facility] CM activity status.   Of note, [facility] Care Management services does not replace or interfere with any services that are needed or arranged by inpatient [facility] care management team.  For additional questions or referrals please contact:  Natividad Brood, RN BSN [facility] Liaison  [phone] business mobile phone Toll free office [phone]  Fax number: [phone] [email or web address]

## 2020-03-27 NOTE — Discharge Instructions (Signed)
PLEASE REMEMBER TO BRING ALL OF YOUR MEDICATIONS TO EACH OF YOUR FOLLOW-UP OFFICE VISITS.  PLEASE ATTEND ALL SCHEDULED FOLLOW-UP APPOINTMENTS.   Activity: Increase activity slowly as tolerated. You may shower, but no soaking baths (or swimming) for 1 week. No driving for 24 hours. No lifting over 5 lbs for 1 week. No sexual activity for 1 week.   You May Return to Work: in 1 week (if applicable)  Wound Care: You may wash cath site gently with soap and water. Keep cath site clean and dry. If you notice pain, swelling, bleeding or pus at your cath site, please call [phone].    Heart Failure Education: 1. Weigh yourself EVERY morning after you go to the bathroom but before you eat or drink anything. Write this number down in a weight log/diary. If you gain 3 pounds overnight or 5 pounds in a week, call the office. 2. Take your medicines as prescribed. If you have concerns about your medications, please call us before you stop taking them.  3. Eat low salt foods--Limit salt (sodium) to 2000 mg per day. This will help prevent your body from holding onto fluid. Read food labels as many processed foods have a lot of sodium, especially canned goods and prepackaged meats. If you would like some assistance choosing low sodium foods, we would be happy to set you up with a nutritionist. 4. Stay as active as you can everyday. Staying active will give you more energy and make your muscles stronger. Start with 5 minutes at a time and work your way up to 30 minutes a day. Break up your activities--do some in the morning and some in the afternoon. Start with 3 days per week and work your way up to 5 days as you can.  If you have chest pain, feel short of breath, dizzy, or lightheaded, STOP. If you don't feel better after a short rest, call 911. If you do feel better, call the office to let us know you have symptoms with exercise. 5. Limit all fluids for the day to less than 2 liters. Fluid includes all drinks,  coffee, juice, ice chips, soup, jello, and all other liquids.

## 2020-03-28 DIAGNOSIS — Z955 Presence of coronary angioplasty implant and graft: Secondary | ICD-10-CM

## 2020-03-28 LAB — CBC
HCT: 34.1 % — ABNORMAL LOW (ref 36.0–46.0)
Hemoglobin: 10.9 g/dL — ABNORMAL LOW (ref 12.0–15.0)
MCH: 27.3 pg (ref 26.0–34.0)
MCHC: 32 g/dL (ref 30.0–36.0)
MCV: 85.5 fL (ref 80.0–100.0)
Platelets: 257 10*3/uL (ref 150–400)
RBC: 3.99 MIL/uL (ref 3.87–5.11)
RDW: 12.8 % (ref 11.5–15.5)
WBC: 6.8 10*3/uL (ref 4.0–10.5)
nRBC: 0 % (ref 0.0–0.2)

## 2020-03-28 LAB — BASIC METABOLIC PANEL
Anion gap: 13 (ref 5–15)
BUN: 36 mg/dL — ABNORMAL HIGH (ref 8–23)
CO2: 30 mmol/L (ref 22–32)
Calcium: 9.3 mg/dL (ref 8.9–10.3)
Chloride: 91 mmol/L — ABNORMAL LOW (ref 98–111)
Creatinine, Ser: 1.83 mg/dL — ABNORMAL HIGH (ref 0.44–1.00)
GFR calc Af Amer: 30 mL/min — ABNORMAL LOW (ref >60)
GFR calc non Af Amer: 26 mL/min — ABNORMAL LOW (ref >60)
Glucose, Bld: 146 mg/dL — ABNORMAL HIGH (ref 70–99)
Potassium: 3.9 mmol/L (ref 3.5–5.1)
Sodium: 134 mmol/L — ABNORMAL LOW (ref 135–145)

## 2020-03-28 LAB — MAGNESIUM: Magnesium: 2.5 mg/dL — ABNORMAL HIGH (ref 1.7–2.4)

## 2020-03-28 MED ORDER — ALPRAZOLAM 0.25 MG PO TABS
0.2500 mg | ORAL_TABLET | Freq: Every day | ORAL | Status: DC | PRN
  Administered 2020-03-28 – 2020-03-31 (×3): 0.25 mg via ORAL
  Filled 2020-03-28 (×3): qty 1

## 2020-03-28 NOTE — Progress Notes (Signed)
Occupational Therapy Treatment Patient Details Name: Deanna [name] MRN: [id number] DOB: 10-19-1940 Today's Date: 03/28/2020    History of present illness Pt is a 79 y/o female admitted secondary to SOB and chest pain. Found to have NSTEMI and is s/p heart cath with arthrectomy. Pt also with respiratory failure during admission and requiring supplemental oxygen. PMH includes HTN, CHF, a fib, CAD, and CKD.    OT comments  Pt admitted with see above. Pt currently with functional limitations due to the deficits listed below (see OT Problem List). Pt at this time completed sit to stand transfers with min guard to RW. Pt was only tolerate about 2 mins.  max of standing tolerance prior to reporting needing to rest. Pt would benefit from going to SNF prior to home due to decrease in stamina to complete any tasks at this time and was not always aware about going into a flexed position while standing and starting to sit prior to surface behind them.  Pt will benefit from skilled OT to increase their safety and independence with ADL and functional mobility for ADL to facilitate discharge to venue listed below.     Follow Up Recommendations  SNF;Supervision/Assistance - 24 hour (due to endurance )    Equipment Recommendations  Tub/shower seat    Recommendations for Other Services PT consult    Precautions / Restrictions Precautions Precautions: Fall Precaution Comments: watch O2       Mobility Bed Mobility Overal bed mobility:  (presented OOB)                Transfers Overall transfer level: Needs assistance Equipment used: Rolling walker (2 wheeled) Transfers: Sit to/from Stand Sit to Stand: Min guard              Balance Overall balance assessment: Needs assistance Sitting-balance support: No upper extremity supported Sitting balance-Leahy Scale: Fair     Standing balance support: Bilateral upper extremity supported;During functional activity Standing balance-Leahy  Scale: Poor Standing balance comment: Reliant on BUE support                            ADL either performed or assessed with clinical judgement   ADL Overall ADL's : Needs assistance/impaired Eating/Feeding: Set up;Sitting   Grooming: Set up;Sitting   Upper Body Bathing: Minimal assistance;Sitting   Lower Body Bathing: Moderate assistance;Sit to/from stand   Upper Body Dressing : Minimal assistance;Sitting   Lower Body Dressing: Moderate assistance;Sit to/from stand Lower Body Dressing Details (indicate cue type and reason): Difficulty bending forward due to abdominal pain. Unable to adjust socks.  Toilet Transfer: Chief of Staff Details (indicate cue type and reason): MIn GUard A for safety Toileting- Clothing Manipulation and Hygiene: Min guard;With adaptive equipment;Cueing for sequencing;Cueing for safety;Sit to/from stand   Tub/ Shower Transfer: Minimal assistance;Cueing for safety;Cueing for sequencing   Functional mobility during ADLs: Min guard;Rolling walker General ADL Comments: pt only amble to ambulate for about 2 mins then started to go into flexed position     Vision       Perception     Praxis      Cognition Arousal/Alertness: Awake/alert Behavior During Therapy: WFL for tasks assessed/performed Overall Cognitive Status: Within Functional Limits for tasks assessed  Exercises     Shoulder Instructions       General Comments      Pertinent Vitals/ Pain       Pain Assessment: 0-10 Pain Score: 0-No pain  Home Living                                          Prior Functioning/Environment              Frequency  Min 2X/week        Progress Toward Goals  OT Goals(current goals can now be found in the care plan section)  Progress towards OT goals: Progressing toward goals  Acute Rehab OT Goals Patient Stated Goal: to feel  better OT Goal Formulation: With patient Time For Goal Achievement: 04/07/20 Potential to Achieve Goals: Good ADL Goals Pt Will Perform Grooming: with modified independence;standing Pt Will Perform Lower Body Dressing: with modified independence;sit to/from stand Pt Will Transfer to Toilet: with modified independence;ambulating;regular height toilet Pt Will Perform Toileting - Clothing Manipulation and hygiene: with modified independence;sit to/from stand;sitting/lateral leans Additional ADL Goal #1: Pt will verbalize three energy conservation techniques for ADLs with Min cues  Plan Discharge plan needs to be updated    Co-evaluation    PT/OT/SLP Co-Evaluation/Treatment: Yes Reason for Co-Treatment: Complexity of the patient's impairments (multi-system involvement);For patient/therapist safety;To address functional/ADL transfers   OT goals addressed during session: ADL's and self-care      AM-PAC OT "6 Clicks" Daily Activity     Outcome Measure   Help from another person eating meals?: None Help from another person taking care of personal grooming?: A Little Help from another person toileting, which includes using toliet, bedpan, or urinal?: A Little Help from another person bathing (including washing, rinsing, drying)?: A Lot Help from another person to put on and taking off regular upper body clothing?: A Little Help from another person to put on and taking off regular lower body clothing?: A Lot 6 Click Score: 17    End of Session Equipment Utilized During Treatment: Oxygen;Rolling walker;Gait belt  OT Visit Diagnosis: Unsteadiness on feet (R26.81);Other abnormalities of gait and mobility (R26.89);Muscle weakness (generalized) (M62.81);Pain   Activity Tolerance Patient limited by fatigue   Patient Left in chair;with call bell/phone within reach;with chair alarm set   Nurse Communication Other (comment) (mobility and need for SNF setting post Ed stay)        Time:  7591-6384 OT Time Calculation (min): 33 min  Charges: OT General Charges $OT Visit: 1 Visit  Joeseph Amor OTR/L  Acute Rehab Services  [phone] office number [phone] pager number    Joeseph Amor 03/28/2020, 12:41 PM

## 2020-03-28 NOTE — Progress Notes (Signed)
Progress Note  Patient Name: Deanna [name] Date of Encounter: 03/28/2020  [facility] Cardiologist: Quay Burow, MD   Subjective   O2sats improved overnight, saturating over 90% with 2L [place].  Inpatient Medications    Scheduled Meds: . aspirin EC  81 mg Oral Daily  . brimonidine  1 drop Both Eyes BID  . Chlorhexidine Gluconate Cloth  6 each Topical Daily  . clopidogrel  75 mg Oral Daily  . divalproex  250 mg Oral BID  . dorzolamide  1 drop Both Eyes BID  . escitalopram  20 mg Oral Daily  . furosemide  40 mg Oral Daily  . heparin injection (subcutaneous)  5,000 Units Subcutaneous Q8H  . latanoprost  1 drop Both Eyes QHS  . levothyroxine  188 mcg Oral Q0600  . magnesium hydroxide  15 mL Oral Daily  . melatonin  6 mg Oral QHS  . metoprolol succinate  25 mg Oral Daily  . mirabegron ER  50 mg Oral Daily  . pantoprazole  40 mg Oral BID AC  . polyethylene glycol  17 g Oral Daily  . rosuvastatin  40 mg Oral Daily  . sodium chloride flush  3 mL Intravenous Q12H  . sodium chloride flush  3 mL Intravenous Q12H  . vitamin B-12  1,000 mcg Oral Daily   Continuous Infusions: . sodium chloride Stopped (03/22/20 0627)  . sodium chloride    . sodium chloride    . sodium chloride     PRN Meds: sodium chloride, Place/Maintain arterial line **AND** sodium chloride, acetaminophen, acetaminophen, albuterol, gabapentin, nitroGLYCERIN, ondansetron (ZOFRAN) IV, sodium chloride flush, tiZANidine   Vital Signs    Vitals:   03/27/20 1450 03/27/20 2100 03/28/20 0024 03/28/20 0500  BP: 102/88 (!) 139/56 (!) 93/46 (!) 138/44  Pulse: 84 89 81   Resp: 20 16 15 17   Temp: 98.9 F (37.2 C) 98.1 F (36.7 C) 98.1 F (36.7 C) 98.2 F (36.8 C)  TempSrc: Oral Oral Oral Oral  SpO2: 95% 98% 99% 94%  Weight:      Height:        Intake/Output Summary (Last 24 hours) at 03/28/2020 0828 Last data filed at 03/28/2020 0600 Gross per 24 hour  Intake 780 ml  Output 1350 ml  Net -570 ml    Last 3 Weights 03/26/2020 03/25/2020 03/25/2020  Weight (lbs) 202 lb 2.6 oz 212 lb 8.4 oz 212 lb 8.4 oz  Weight (kg) 91.7 kg 96.4 kg 96.4 kg  Some encounter information is confidential and restricted. Go to Review Flowsheets activity to see all data.      Telemetry    Sinus - Personally Reviewed  Physical Exam   GEN: No acute distress.   Neck: No JVD Cardiac: RRR, no murmurs, rubs, or gallops.  Respiratory:  Mildly diminished breath sounds bases. GI: Soft, nontender, non-distended  MS: No edema Neuro:  Nonfocal  Psych: Normal affect   Labs    High Sensitivity Troponin:   Recent Labs  Lab 03/18/20 1737 03/18/20 2248 03/19/20 0502 03/25/20 1405 03/25/20 2147  TROPONINIHS 4,011* 3,425* 3,370* 496* 463*      Chemistry Recent Labs  Lab 03/23/20 1155 03/24/20 1042 03/25/20 1405 03/26/20 0500 03/27/20 0438  NA  --    < > 138 135 132*  K  --    < > 4.3 3.5 3.7  CL  --    < > 95* 89* 88*  CO2  --    < > 32 35* 33*  GLUCOSE  --    < > 108* 119* 124*  BUN  --    < > 24* 29* 35*  CREATININE  --    < > 1.38* 1.58* 1.81*  CALCIUM  --    < > 9.4 9.1 9.0  PROT 5.4*  --   --   --   --   ALBUMIN 2.6*  --   --   --   --   AST 15  --   --   --   --   ALT 8  --   --   --   --   ALKPHOS 47  --   --   --   --   BILITOT 0.9  --   --   --   --   GFRNONAA  --    < > 37* 31* 26*  GFRAA  --    < > 42* 36* 30*  ANIONGAP  --    < > 11 11 11    < > = values in this interval not displayed.     Hematology Recent Labs  Lab 03/25/20 1405 03/26/20 0500 03/27/20 0438  WBC 8.1 5.9 7.6  RBC 4.41 3.74* 3.81*  HGB 12.1 10.3* 10.4*  HCT 38.4 32.3* 32.4*  MCV 87.1 86.4 85.0  MCH 27.4 27.5 27.3  MCHC 31.5 31.9 32.1  RDW 13.2 12.9 13.0  PLT 229 183 232    BNP Recent Labs  Lab 03/25/20 1405  BNP 1,834.3*     Radiology    No results found.  Patient Profile     79 y.o. female with a history of CAD s/p CABG in 1999, PAD with right fem-fem bypass and left subclavian stenosis  s/p LSCA to left carotid bypass, hypertension, and hyperlipidemia who was admitted on 03/18/2020 with NSTEMI.  Echocardiogram showed ejection fraction 35 to 14%, grade 2 diastolic dysfunction, akinesis of the anteroseptal wall and apex, mild mitral regurgitation.  Cardiac catheterization revealed 99% proximal LAD with atretic LIMA to the LAD, 100% mid LAD, occluded circumflex, occluded right coronary artery.  Patient had PCI of the proximal LAD but mid LAD unsuccessful.  Medical therapy recommended for her residual coronary disease and for CHF.  Patient developed CHF following EGD yesterday.  Was felt to be hypotensive and placed on pressors but then noted to have left subclavian stenosis.  Blood pressures should be taken in legs.  Assessment & Plan    1 acute on chronic combined systolic/diastolic congestive heart failure-patient developed CHF yesterday following EGD.  She has diuresed and improved symptomatically, she had increasing creatinine and her Lasix was changed to 40 mg p.o. daily.  She has been experiencing desaturations with minimal exertion, yesterday with respiratory therapy is down to 79%, she is being evaluated for home O2, currently on 2 L via nasal cannula saturating in low 90s.  She will need to be discharged to nursing facility or rehab facility we will place a consult for transition of care.  She can be discharged from cardiac standpoint.  2 non-ST elevation myocardial infarction-patient is status post PCI of the proximal LAD but attempt at mid LAD unsuccessful.  Plan is medical therapy.  Continue aspirin, Plavix and statin.  Resume metoprolol 12.5 mg twice daily.  Transition to Toprol at time of discharge once dose stable.  3 ischemic cardiomyopathy-we will resume low-dose beta-blocker and transition to Toprol at discharge as outlined.  Will resume ARB tomorrow if renal function and blood pressure stable.  4  peripheral vascular disease-patient has known left subclavian stenosis.   Continue aspirin and statin.  Blood pressure should not be taken in left upper extremity (patient apparently was documented to have hypotension yesterday but pressure taken from left upper extremity).  5 hyperlipidemia-continue statin.  6 chronic stage III kidney disease-follow renal function closely in-house.  7 abdominal pain-EGD yesterday showed mild gastritis.  Hemoglobin stable.  For questions or updates, please contact [place] Please consult [email or web address] for contact info under    Signed, Ena Dawley, MD  03/28/2020, 8:28 AM

## 2020-03-28 NOTE — Progress Notes (Signed)
Physical Therapy Treatment Patient Details Name: Deanna [name] MRN: [id number] DOB: [birth year] Today's Date: 03/28/2020    History of Present Illness Pt is a 79 y/o female admitted secondary to SOB and chest pain. Found to have NSTEMI and is s/p heart cath with arthrectomy. Pt also with respiratory failure during admission and requiring supplemental oxygen. PMH includes HTN, CHF, a fib, CAD, and CKD.     PT Comments    Patient demonstrated decreased endurance with gait today. She needed seated rest breaks every 15 feet or so. She de-sated on her last ambulation trial. Her initial recommendation was for home health. At this time she still needs significant assist and monitoring for low endurence with ambulation. She may benefit most from rehab at a SNF tto improve her endurance prior to her return home,   Follow Up Recommendations  At this time SNF v [facility] for endurance training prior to return home.      Equipment Recommendations  Rolling walker with 5" wheels    Recommendations for Other Services       Precautions / Restrictions Precautions Precautions: Fall Precaution Comments: watch O2 Restrictions Weight Bearing Restrictions: No    Mobility  Bed Mobility Overal bed mobility:  (presented OOB)             General bed mobility comments: patient found in a chair   Transfers Overall transfer level: Needs assistance Equipment used: Rolling walker (2 wheeled) Transfers: Sit to/from Stand Sit to Stand: Min guard            Ambulation/Gait Ambulation/Gait assistance: Min Web designer (Feet):  (15'x3) Assistive device: Rolling walker (2 wheeled) Gait Pattern/deviations: Step-through pattern;Decreased stride length;Antalgic Gait velocity: Decreased Gait velocity interpretation: <1.31 ft/sec, indicative of household ambulator General Gait Details: Patiewnt ambualted for 15' before reporteing fatigue and requiring a seated rest break. She then stood and walked  again. On the way back to her room she began to de-sat. On one pulse ox her sats were at 76% on hand pulse oxc it was 85%. She was fatigued.    Stairs             Wheelchair Mobility    Modified Rankin (Stroke Patients Only)       Balance Overall balance assessment: Needs assistance Sitting-balance support: No upper extremity supported Sitting balance-Leahy Scale: Fair     Standing balance support: Bilateral upper extremity supported;During functional activity Standing balance-Leahy Scale: Poor Standing balance comment: Reliant on BUE support                             Cognition Arousal/Alertness: Awake/alert Behavior During Therapy: WFL for tasks assessed/performed Overall Cognitive Status: Within Functional Limits for tasks assessed                                        Exercises      General Comments General comments (skin integrity, edema, etc.): 2L of nco2       Pertinent Vitals/Pain Pain Assessment: 0-10 Pain Score: 0-No pain    Home Living                      Prior Function            PT Goals (current goals can now be found in the care plan section) Acute Rehab PT  Goals Patient Stated Goal: to feel better PT Goal Formulation: With patient Time For Goal Achievement: 04/06/20 Potential to Achieve Goals: Good Progress towards PT goals: Progressing toward goals    Frequency    Min 3X/week      PT Plan Discharge plan needs to be updated    Co-evaluation PT/OT/SLP Co-Evaluation/Treatment: Yes Reason for Co-Treatment: Complexity of the patient's impairments (multi-system involvement);Necessary to address cognition/behavior during functional activity;For patient/therapist safety;To address functional/ADL transfers PT goals addressed during session: Mobility/safety with mobility;Proper use of DME;Strengthening/ROM OT goals addressed during session: ADL's and self-care      AM-PAC PT "6 Clicks"  Mobility   Outcome Measure  Help needed turning from your back to your side while in a flat bed without using bedrails?: A Little Help needed moving from lying on your back to sitting on the side of a flat bed without using bedrails?: A Little Help needed moving to and from a bed to a chair (including a wheelchair)?: A Little Help needed standing up from a chair using your arms (e.g., wheelchair or bedside chair)?: A Lot Help needed to walk in hospital room?: A Lot Help needed climbing 3-5 steps with a railing? : Total 6 Click Score: 14    End of Session Equipment Utilized During Treatment: Gait belt;Oxygen Activity Tolerance: Patient tolerated treatment well Patient left: in chair;with call bell/phone within reach;with chair alarm set Nurse Communication: Mobility status PT Visit Diagnosis: Unsteadiness on feet (R26.81);Muscle weakness (generalized) (M62.81);Pain     Time: 1779-3903 PT Time Calculation (min) (ACUTE ONLY): 33 min  Charges:  $Gait Training: 8-22 mins                       Carney Living PT DPT  03/28/2020, 2:20 PM

## 2020-03-28 NOTE — Plan of Care (Signed)
  Problem: Education: °Goal: Understanding of cardiac disease, CV risk reduction, and recovery process will improve °Outcome: Progressing °Goal: Individualized Educational Video(s) °Outcome: Progressing °  °Problem: Activity: °Goal: Ability to tolerate increased activity will improve °Outcome: Progressing °  °Problem: Cardiac: °Goal: Ability to achieve and maintain adequate cardiovascular perfusion will improve °Outcome: Progressing °  °Problem: Health Behavior/Discharge Planning: °Goal: Ability to safely manage health-related needs after discharge will improve °Outcome: Progressing °  °Problem: Education: °Goal: Knowledge of General Education information will improve °Description: Including pain rating scale, medication(s)/side effects and non-pharmacologic comfort measures °Outcome: Progressing °  °

## 2020-03-28 NOTE — Progress Notes (Signed)
CARDIAC REHAB PHASE I   PRE:  Rate/Rhythm: 80 SR  BP:  Supine: 147/58  Sitting:   Standing:    SaO2: 95% 2L  MODE:  Ambulation: 40 ft   POST:  Rate/Rhythm: 113 ST PVCs  BP:  Supine:   Sitting: 156/60  Standing:    SaO2: 94% 2L 1504-1364 Assisted pt to [facility] for BM and to void. Took purewick off and changed pad and panty. Pt cleaned and then we walked 40 ft on 2L. Used rolling walker, gait belt and asst x 2.. Pt encouraged to stay close to rolling walker and to take bigger steps. To recliner with call bell with chair alarm on. PT to see also. Pt unsteady and needs assistance when up.    Graylon Good, RN BSN  03/28/2020 9:58 AM

## 2020-03-29 ENCOUNTER — Encounter: Payer: Self-pay | Admitting: Gastroenterology

## 2020-03-29 LAB — CBC
HCT: 32.3 % — ABNORMAL LOW (ref 36.0–46.0)
Hemoglobin: 10.4 g/dL — ABNORMAL LOW (ref 12.0–15.0)
MCH: 27.4 pg (ref 26.0–34.0)
MCHC: 32.2 g/dL (ref 30.0–36.0)
MCV: 85 fL (ref 80.0–100.0)
Platelets: 235 10*3/uL (ref 150–400)
RBC: 3.8 MIL/uL — ABNORMAL LOW (ref 3.87–5.11)
RDW: 12.9 % (ref 11.5–15.5)
WBC: 6.5 10*3/uL (ref 4.0–10.5)
nRBC: 0 % (ref 0.0–0.2)

## 2020-03-29 LAB — BASIC METABOLIC PANEL
Anion gap: 13 (ref 5–15)
BUN: 35 mg/dL — ABNORMAL HIGH (ref 8–23)
CO2: 32 mmol/L (ref 22–32)
Calcium: 9.3 mg/dL (ref 8.9–10.3)
Chloride: 90 mmol/L — ABNORMAL LOW (ref 98–111)
Creatinine, Ser: 1.64 mg/dL — ABNORMAL HIGH (ref 0.44–1.00)
GFR calc Af Amer: 34 mL/min — ABNORMAL LOW (ref >60)
GFR calc non Af Amer: 30 mL/min — ABNORMAL LOW (ref >60)
Glucose, Bld: 114 mg/dL — ABNORMAL HIGH (ref 70–99)
Potassium: 3.7 mmol/L (ref 3.5–5.1)
Sodium: 135 mmol/L (ref 135–145)

## 2020-03-29 LAB — MAGNESIUM: Magnesium: 2.4 mg/dL (ref 1.7–2.4)

## 2020-03-29 NOTE — Progress Notes (Signed)
Progress Note  Patient Name: Deanna [name] Date of Encounter: 03/29/2020  [facility] Cardiologist: Quay Burow, MD   Subjective   O2sats improved overnight, saturating over 90% with 2L [place].  Inpatient Medications    Scheduled Meds: . aspirin EC  81 mg Oral Daily  . brimonidine  1 drop Both Eyes BID  . Chlorhexidine Gluconate Cloth  6 each Topical Daily  . clopidogrel  75 mg Oral Daily  . divalproex  250 mg Oral BID  . dorzolamide  1 drop Both Eyes BID  . escitalopram  20 mg Oral Daily  . furosemide  40 mg Oral Daily  . heparin injection (subcutaneous)  5,000 Units Subcutaneous Q8H  . latanoprost  1 drop Both Eyes QHS  . levothyroxine  188 mcg Oral Q0600  . magnesium hydroxide  15 mL Oral Daily  . melatonin  6 mg Oral QHS  . metoprolol succinate  25 mg Oral Daily  . mirabegron ER  50 mg Oral Daily  . pantoprazole  40 mg Oral BID AC  . polyethylene glycol  17 g Oral Daily  . rosuvastatin  40 mg Oral Daily  . sodium chloride flush  3 mL Intravenous Q12H  . sodium chloride flush  3 mL Intravenous Q12H  . vitamin B-12  1,000 mcg Oral Daily   Continuous Infusions: . sodium chloride Stopped (03/22/20 0627)  . sodium chloride    . sodium chloride    . sodium chloride     PRN Meds: sodium chloride, Place/Maintain arterial line **AND** sodium chloride, acetaminophen, acetaminophen, albuterol, ALPRAZolam, gabapentin, nitroGLYCERIN, ondansetron (ZOFRAN) IV, sodium chloride flush, tiZANidine   Vital Signs    Vitals:   03/28/20 0500 03/28/20 1216 03/28/20 2039 03/29/20 0430  BP: (!) 138/44 134/80 (!) 151/91 (!) 146/50  Pulse:  81 86 70  Resp: 17 17 14 14   Temp: 98.2 F (36.8 C) 98.6 F (37 C) 98.2 F (36.8 C) 98.4 F (36.9 C)  TempSrc: Oral Oral Axillary Oral  SpO2: 94% 93% 97% 96%  Weight:    91.2 kg  Height:        Intake/Output Summary (Last 24 hours) at 03/29/2020 0834 Last data filed at 03/29/2020 0427 Gross per 24 hour  Intake 3 ml  Output 1250 ml    Net -1247 ml   Last 3 Weights 03/29/2020 03/26/2020 03/25/2020  Weight (lbs) 201 lb 1.6 oz 202 lb 2.6 oz 212 lb 8.4 oz  Weight (kg) 91.218 kg 91.7 kg 96.4 kg  Some encounter information is confidential and restricted. Go to Review Flowsheets activity to see all data.      Telemetry    Sinus - Personally Reviewed  Physical Exam   GEN: No acute distress.   Neck: No JVD Cardiac: RRR, no murmurs, rubs, or gallops.  Respiratory:  Mildly diminished breath sounds bases. GI: Soft, nontender, non-distended  MS: No edema Neuro:  Nonfocal  Psych: Normal affect   Labs    High Sensitivity Troponin:   Recent Labs  Lab 03/18/20 1737 03/18/20 2248 03/19/20 0502 03/25/20 1405 03/25/20 2147  TROPONINIHS 4,011* 3,425* 3,370* 496* 463*      Chemistry Recent Labs  Lab 03/23/20 1155 03/24/20 1042 03/27/20 0438 03/28/20 1802 03/29/20 0405  NA  --    < > 132* 134* 135  K  --    < > 3.7 3.9 3.7  CL  --    < > 88* 91* 90*  CO2  --    < >  33* 30 32  GLUCOSE  --    < > 124* 146* 114*  BUN  --    < > 35* 36* 35*  CREATININE  --    < > 1.81* 1.83* 1.64*  CALCIUM  --    < > 9.0 9.3 9.3  PROT 5.4*  --   --   --   --   ALBUMIN 2.6*  --   --   --   --   AST 15  --   --   --   --   ALT 8  --   --   --   --   ALKPHOS 47  --   --   --   --   BILITOT 0.9  --   --   --   --   GFRNONAA  --    < > 26* 26* 30*  GFRAA  --    < > 30* 30* 34*  ANIONGAP  --    < > 11 13 13    < > = values in this interval not displayed.     Hematology Recent Labs  Lab 03/27/20 0438 03/28/20 1802 03/29/20 0405  WBC 7.6 6.8 6.5  RBC 3.81* 3.99 3.80*  HGB 10.4* 10.9* 10.4*  HCT 32.4* 34.1* 32.3*  MCV 85.0 85.5 85.0  MCH 27.3 27.3 27.4  MCHC 32.1 32.0 32.2  RDW 13.0 12.8 12.9  PLT 232 257 235    BNP Recent Labs  Lab 03/25/20 1405  BNP 1,834.3*     Radiology    No results found.  Patient Profile     79 y.o. female with a history of CAD s/p CABG in 1999, PAD with right fem-fem bypass and left  subclavian stenosis s/p LSCA to left carotid bypass, hypertension, and hyperlipidemia who was admitted on 03/18/2020 with NSTEMI.  Echocardiogram showed ejection fraction 35 to 13%, grade 2 diastolic dysfunction, akinesis of the anteroseptal wall and apex, mild mitral regurgitation.  Cardiac catheterization revealed 99% proximal LAD with atretic LIMA to the LAD, 100% mid LAD, occluded circumflex, occluded right coronary artery.  Patient had PCI of the proximal LAD but mid LAD unsuccessful.  Medical therapy recommended for her residual coronary disease and for CHF.  Patient developed CHF following EGD yesterday.  Was felt to be hypotensive and placed on pressors but then noted to have left subclavian stenosis.  Blood pressures should be taken in legs.  Assessment & Plan    1 acute on chronic combined systolic/diastolic congestive heart failure-patient developed CHF on 8/19 following EGD.  She has diuresed and improved symptomatically, we transitioned to PO Lasix 40 mg daily with good diuresis overnight - 1.2 L. [place] 1.58->1.83->1.64.  She has been experiencing desaturations with minimal exertion, yesterday with respiratory therapy is down to 79%, she is being evaluated for home O2, currently on 2 L via nasal cannula saturating in low 90s.  She will need to be discharged to a skilled nursing facility or rehab facility, she is severely deconditioned and unsteady on her feet and desaturating while walking. We placed a consult for transition of care.    2 non-ST elevation myocardial infarction-patient is status post PCI of the proximal LAD but attempt at mid LAD unsuccessful.  Plan is medical therapy.  Continue aspirin, Plavix and statin.  Resume metoprolol 12.5 mg twice daily.  Transition to Toprol at time of discharge once dose stable.  3 ischemic cardiomyopathy-we will resume low-dose beta-blocker and transition to Toprol at discharge  as outlined.  Will resume ARB tomorrow if renal function and blood  pressure stable.  4 peripheral vascular disease-patient has known left subclavian stenosis.  Continue aspirin and statin.  Blood pressure should not be taken in left upper extremity (patient apparently was documented to have hypotension yesterday but pressure taken from left upper extremity).  5 hyperlipidemia-continue statin.  6 chronic stage III kidney disease-follow renal function closely in-house.  7 abdominal pain-EGD yesterday showed mild gastritis.  Hemoglobin stable.  For questions or updates, please contact [place] Please consult [email or web address] for contact info under    Signed, Ena Dawley, MD  03/29/2020, 8:34 AM

## 2020-03-29 NOTE — TOC Initial Note (Addendum)
Transition of Care [facility]) - Initial/Assessment Note    Patient Details  Name: Deanna [name] MRN: [id number] Date of Birth: [birth year]  Transition of [facility] CM/SW Contact:    Trula Ore, [place] Phone Number: 03/29/2020, 10:48 AM  Clinical Narrative:                   Update 11:56pm 8/22-CSW tried to start insurance authorization for patient. This patient is managed by regular [employer] tele#[phone]. Insurance needs facility choice before proceeding with insurance authorization. CSW will need to get facility choice from patient then call facility choice to start insurance authorization for patient.   CSW will continue to follow. CSW spoke with patient at bedside. Patient is agreeable to SNF placement. Patient gave CSW permission to fax out initial referral near [place] area. Patient lives with her daughter [name]. Patient gave CSW permission to discuss her care with her daughter. Patient has had both Covid Vaccines.  CSW will fax out initial referral. CSW will start insurance authorization.  CSW will continue to follow.  Expected Discharge Plan: Skilled Nursing Facility Barriers to Discharge: Continued Medical Work up   Patient Goals and CMS Choice Patient states their goals for this hospitalization and ongoing recovery are:: to go to SNF CMS Medicare.gov Compare Post Acute Care list provided to:: Patient Choice offered to / list presented to : Patient  Expected Discharge Plan and Services Expected Discharge Plan: [place]       Living arrangements for the past 2 months: Single Family Home                                      Prior Living Arrangements/Services Living arrangements for the past 2 months: Single Family Home Lives with:: Self, Adult Children (daughter [name]) Patient language and need for interpreter reviewed:: Yes Do you feel safe going back to the place where you live?: No   SNF  Need for Family  Participation in Patient Care: Yes (Comment) Care giver support system in place?: Yes (comment)   Criminal Activity/Legal Involvement Pertinent to Current Situation/Hospitalization: No - Comment as needed  Activities of Daily Living Home Assistive Devices/Equipment: Environmental consultant (specify type), Eyeglasses (rollator) ADL Screening (condition at time of admission) Patient's cognitive ability adequate to safely complete daily activities?: Yes Is the patient deaf or have difficulty hearing?: Yes Does the patient have difficulty seeing, even when wearing glasses/contacts?: Yes Does the patient have difficulty concentrating, remembering, or making decisions?: Yes Patient able to express need for assistance with ADLs?: Yes Does the patient have difficulty dressing or bathing?: No Independently performs ADLs?: Yes (appropriate for developmental age) Does the patient have difficulty walking or climbing stairs?: Yes Weakness of Legs: Both Weakness of Arms/Hands: Both  Permission Sought/Granted Permission sought to share information with : Case Manager, Family Supports, Customer service manager Permission granted to share information with : Yes, Verbal Permission Granted  Share Information with NAME: [name]  Permission granted to share info w AGENCY: SNF  Permission granted to share info w Relationship: Daughter  Permission granted to share info w Contact Information: [name] [phone]  Emotional Assessment Appearance:: Appears stated age Attitude/Demeanor/Rapport: Gracious Affect (typically observed): Calm Orientation: : Oriented to Self, Oriented to Place, Oriented to  Time, Oriented to Situation Alcohol / Substance Use: Not Applicable Psych Involvement: No (comment)  Admission diagnosis:  NSTEMI (non-ST elevated myocardial infarction) [facility]) [I21.4] Patient Active Problem  List   Diagnosis Date Noted  . Status post coronary artery stent placement   . Hypoxia   . Overactive bladder  12/16/2019  . Peripheral arterial disease ([place]) 03/27/2019  . a 06/17/2018  . Mood disorder ([place]) 06/17/2018  . Acute on chronic diastolic heart failure ([place]) 11/16/2017  . Hypertensive urgency 11/14/2017  . Thrombocytopenia ([place]) 04/26/2015  . OSA on CPAP 04/26/2015  . 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 Chronic pain 04/26/2015  . Physical deconditioning 04/26/2015  . Critical lower limb ischemia 02/25/2015  . Hyperlipidemia 07/02/2014  . Carotid artery stenosis 03/26/2012  . Pleural effusion, left 12/19/2011  . NSTEMI (non-ST elevated myocardial infarction) ([place]) 12/19/2011  . Shortness of breath 12/19/2011    Class: Acute  . 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 PERSONAL HX COLONIC POLYPS 06/18/2010  . Hypothyroidism 11/17/2008  . Obesity, Class III, BMI 40-49.9 (morbid obesity) ([place]) 11/17/2008  . ANXIETY DEPRESSION 11/17/2008  . Essential hypertension 11/17/2008  . Atherosclerosis of coronary artery bypass graft(s), unspecified, with other forms of angina pectoris ([place]) 11/17/2008  . HEMORRHOIDS 11/17/2008  . GERD 11/17/2008  . IBS 11/17/2008   PCP:  Lauree Chandler, NP Pharmacy:   [facility] [place], [place] - [address] [place] [address] Phone: [phone] Fax: [facility] [address], [place] [address] Phone: [phone] Fax: [phone]     Social Determinants of Health (SDOH) Interventions    Readmission Risk Interventions No flowsheet data found.

## 2020-03-29 NOTE — NC FL2 (Signed)
[place] LEVEL OF CARE SCREENING TOOL     IDENTIFICATION  Patient Name: [name] Birthdate: 11-07-1940 Sex: female Admission Date (Current Location): 03/18/2020  [facility] and [place] Number:  Herbalist and Address:  [facility] [place] [address], [place], [place] [address]      Provider Number: [id number]  Attending Physician Name and Address:  Minus Breeding, MD  Relative Name and Phone Number:  [name] [phone]    Current Level of Care: Hospital Recommended Level of Care: [place] Prior Approval Number:    Date Approved/Denied:   PASRR Number: [id number]  Discharge Plan: SNF    Current Diagnoses: Patient Active Problem List   Diagnosis Date Noted  . Status post coronary artery stent placement   . Hypoxia   . Overactive bladder 12/16/2019  . Peripheral arterial disease ([place]) 03/27/2019  . a 06/17/2018  . Mood disorder ([place]) 06/17/2018  . Acute on chronic diastolic heart failure ([place]) 11/16/2017  . Hypertensive urgency 11/14/2017  . Thrombocytopenia ([place]) 04/26/2015  . OSA on CPAP 04/26/2015  . Celiac artery stenosis ([place]) 04/26/2015  . Chronic pain 04/26/2015  . Physical deconditioning 04/26/2015  . Critical lower limb ischemia 02/25/2015  . Hyperlipidemia 07/02/2014  . Carotid artery stenosis 03/26/2012  . Pleural effusion, left 12/19/2011  . NSTEMI (non-ST elevated myocardial infarction) ([place]) 12/19/2011  . Shortness of breath 12/19/2011  . 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 PERSONAL HX COLONIC POLYPS 06/18/2010  . Hypothyroidism 11/17/2008  . Obesity, Class III, BMI 40-49.9 (morbid obesity) ([place]) 11/17/2008  . ANXIETY DEPRESSION 11/17/2008  . Essential hypertension 11/17/2008  . Atherosclerosis of coronary artery bypass graft(s), unspecified, with other forms of angina pectoris ([place]) 11/17/2008  . HEMORRHOIDS 11/17/2008  . GERD 11/17/2008  . IBS  11/17/2008    Orientation RESPIRATION BLADDER Height & Weight     Self, Time, Situation, Place  O2 (Nasal Cannula 4 liters) Incontinent, External catheter (External Urinary Catheter) Weight: 201 lb 1.6 oz (91.2 kg) Height:  5\' 2"  (157.5 cm)  BEHAVIORAL SYMPTOMS/MOOD NEUROLOGICAL BOWEL NUTRITION STATUS      Continent Diet (See Discharge Summary)  AMBULATORY STATUS COMMUNICATION OF NEEDS Skin   Limited Assist Verbally Other (Comment) (Approprtiate for Ethnicity,Dry, Intact, Non-tenting)                       Personal Care Assistance Level of Assistance  Bathing, Feeding, Dressing Bathing Assistance: Limited assistance Feeding assistance: Independent (able to feed self;Cardiac Carb Modified) Dressing Assistance: Limited assistance     Functional Limitations Info  Sight, Hearing, Speech Sight Info: Impaired Hearing Info: Adequate Speech Info: Adequate    SPECIAL CARE FACTORS FREQUENCY  PT (By licensed PT), OT (By licensed OT)     PT Frequency: 5x min weekly OT Frequency: 5x min weekly            Contractures Contractures Info: Not present    Additional Factors Info  Code Status, Allergies, Psychotropic Code Status Info: FULL Code Allergies Info: Donepezil,Duloxetine Hcl,Meloxicam,Memantine,Oxycodone,Vicodin hydrocodone-acetaminophen Psychotropic Info: divalproex (DEPAKOTE ER) 24 hr tablet 250 mg two times daily,escitalopram (LEXAPRO) tablet 20 mg daily,         Current Medications (03/29/2020):  This is the current hospital active medication list Current Facility-Administered Medications  Medication Dose Route Frequency Provider Last Rate Last Admin  . 0.9 %  sodium chloride infusion   Intravenous Continuous Isaiah Serge, NP  Paused at 03/22/20 0627  . 0.9 %  sodium chloride infusion  250 mL Intravenous PRN Cecilie Kicks R, NP      . 0.9 %  sodium chloride infusion  250 mL Intravenous Continuous Cecilie Kicks R, NP      . 0.9 %  sodium chloride infusion    Intra-arterial PRN Isaiah Serge, NP      . acetaminophen (TYLENOL) tablet 325 mg  325 mg Oral Q4H PRN Isaiah Serge, NP   325 mg at 03/22/20 0132  . acetaminophen (TYLENOL) tablet 650 mg  650 mg Oral Q4H PRN Isaiah Serge, NP   650 mg at 03/29/20 1041  . albuterol (PROVENTIL) (2.5 MG/3ML) 0.083% nebulizer solution 2.5 mg  2.5 mg Nebulization Q4H PRN Isaiah Serge, NP   2.5 mg at 03/19/20 0555  . ALPRAZolam Duanne Moron) tablet 0.25 mg  0.25 mg Oral Daily PRN Renae Fickle, MD   0.25 mg at 03/28/20 2336  . aspirin EC tablet 81 mg  81 mg Oral Daily Isaiah Serge, NP   81 mg at 03/29/20 1028  . brimonidine (ALPHAGAN) 0.2 % ophthalmic solution 1 drop  1 drop Both Eyes BID Isaiah Serge, NP   1 drop at 03/29/20 1025  . Chlorhexidine Gluconate Cloth 2 % PADS 6 each  6 each Topical Daily Isaiah Serge, NP   6 each at 03/27/20 0910  . clopidogrel (PLAVIX) tablet 75 mg  75 mg Oral Daily Isaiah Serge, NP   75 mg at 03/29/20 1027  . divalproex (DEPAKOTE ER) 24 hr tablet 250 mg  250 mg Oral BID Isaiah Serge, NP   250 mg at 03/29/20 1027  . dorzolamide (TRUSOPT) 2 % ophthalmic solution 1 drop  1 drop Both Eyes BID Cecilie Kicks R, NP   1 drop at 03/29/20 1028  . escitalopram (LEXAPRO) tablet 20 mg  20 mg Oral Daily Cecilie Kicks R, NP   20 mg at 03/29/20 1027  . furosemide (LASIX) tablet 40 mg  40 mg Oral Daily Dorothy Spark, MD   40 mg at 03/29/20 1026  . gabapentin (NEURONTIN) capsule 200 mg  200 mg Oral BID PRN Isaiah Serge, NP   200 mg at 03/20/20 2158  . heparin injection 5,000 Units  5,000 Units Subcutaneous Q8H Candee Furbish, MD   5,000 Units at 03/29/20 [phone]  . latanoprost (XALATAN) 0.005 % ophthalmic solution 1 drop  1 drop Both Eyes QHS Isaiah Serge, NP   1 drop at 03/28/20 2110  . levothyroxine (SYNTHROID) tablet 188 mcg  188 mcg Oral Q0600 Isaiah Serge, NP   188 mcg at 03/29/20 6761  . magnesium hydroxide (MILK OF MAGNESIA) suspension 15 mL  15 mL Oral Daily Dorothy Spark, MD   15 mL at 03/29/20 1024  . melatonin tablet 6 mg  6 mg Oral QHS Renae Fickle, MD   6 mg at 03/28/20 2104  . metoprolol succinate (TOPROL-XL) 24 hr tablet 25 mg  25 mg Oral Daily Roby Lofts M., PA-C   25 mg at 03/29/20 1027  . mirabegron ER (MYRBETRIQ) tablet 50 mg  50 mg Oral Daily Cecilie Kicks R, NP   50 mg at 03/29/20 1026  . nitroGLYCERIN (NITROSTAT) SL tablet 0.4 mg  0.4 mg Sublingual Q5 Min x 3 PRN Isaiah Serge, NP      . ondansetron [facility]) injection 4 mg  4 mg Intravenous Q6H PRN Isaiah Serge, NP      .  pantoprazole (PROTONIX) EC tablet 40 mg  40 mg Oral BID AC Mansouraty, Telford Nab., MD   40 mg at 03/29/20 1026  . polyethylene glycol (MIRALAX / GLYCOLAX) packet 17 g  17 g Oral Daily Zehr, Jessica D, PA-C   17 g at 03/29/20 1025  . rosuvastatin (CRESTOR) tablet 40 mg  40 mg Oral Daily Isaiah Serge, NP   40 mg at 03/29/20 1029  . sodium chloride flush (NS) 0.9 % injection 3 mL  3 mL Intravenous Q12H Isaiah Serge, NP   3 mL at 03/27/20 0913  . sodium chloride flush (NS) 0.9 % injection 3 mL  3 mL Intravenous Q12H Isaiah Serge, NP   3 mL at 03/29/20 1029  . sodium chloride flush (NS) 0.9 % injection 3 mL  3 mL Intravenous PRN Isaiah Serge, NP   3 mL at 03/26/20 2200  . tiZANidine (ZANAFLEX) tablet 2 mg  2 mg Oral Q6H PRN Isaiah Serge, NP   2 mg at 03/23/20 2141  . vitamin B-12 (CYANOCOBALAMIN) tablet 1,000 mcg  1,000 mcg Oral Daily Isaiah Serge, NP   1,000 mcg at 03/29/20 1026     Discharge Medications: Please see discharge summary for a list of discharge medications.  Relevant Imaging Results:  Relevant Lab Results:   Additional Information [phone]  Trula Ore, LCSWA

## 2020-03-30 DIAGNOSIS — I5033 Acute on chronic diastolic (congestive) heart failure: Secondary | ICD-10-CM

## 2020-03-30 DIAGNOSIS — R0602 Shortness of breath: Secondary | ICD-10-CM

## 2020-03-30 DIAGNOSIS — Z955 Presence of coronary angioplasty implant and graft: Secondary | ICD-10-CM

## 2020-03-30 LAB — MAGNESIUM: Magnesium: 2.5 mg/dL — ABNORMAL HIGH (ref 1.7–2.4)

## 2020-03-30 NOTE — TOC Progression Note (Addendum)
Transition of Care [facility]) - Progression Note    Patient Details  Name: Deanna [name] MRN: [id number] Date of Birth: 1940-09-16  Transition of Care [facility]) CM/SW [place], [place] Phone Number: 03/30/2020, 11:55 AM  Clinical Narrative:      Update 8/23 3:20pm- CSW spoke with patient and daughter at beside. Patient chooses SNF placement at [facility]. CSW spoke with [place] with [facility] who confirmed bed offer and they are going to start insurance authorization for patient.    Patient has SNF bed at [facility]. Insurance authorization pending.  CSW will continue to follow. CSW provided bed offers to patient. CSW gave patient Medicare Compare list of bed offers to review. Patient asked CSW to call her daughter to go over bed offers. CSW called patients daughter [name] and provided her with bed offers. CSW also told [name] how she can go to [facility] Compare site for nursing facilitys to see reviews for the facilities offered. Patients daughter told CSW she is planning to see patient after lunch and will go over SNF choice together. CSW let patient and patients daughter know she will follow up with patient and daughter with SNF choice after lunch. Patient has had both of her covid vaccines.  Pending SNF choice. Once SNF choice is made CSW will call SNF to start insurance authorization for patient.  CSW will continue to follow.  Expected Discharge Plan: [place] Barriers to Discharge: Continued Medical Work up  Expected Discharge Plan and Services Expected Discharge Plan: [place] arrangements for the past 2 months: Single Family Home                                       Social Determinants of Health (SDOH) Interventions    Readmission Risk Interventions No flowsheet data found.

## 2020-03-30 NOTE — Telephone Encounter (Signed)
Currently admitted.

## 2020-03-30 NOTE — Progress Notes (Signed)
Physical Therapy Treatment Patient Details Name: Deanna [name] MRN: [id number] DOB: Oct 01, 1940 Today's Date: 03/30/2020    History of Present Illness Pt is a 79 y/o female admitted secondary to SOB and chest pain. Found to have NSTEMI and is s/p heart cath with arthrectomy. Pt also with respiratory failure during admission and requiring supplemental oxygen. PMH includes HTN, CHF, a fib, CAD, and CKD.     PT Comments    Pt making good progress.  She was on 4 L of O2 with stable sats with ambulation.  Pt required frequent rest breaks but was able to increased greatest walking distance to 40'.  Required cues for transfer techniques and lines/lead safety.  Cont POC.     Follow Up Recommendations  SNF     Equipment Recommendations  Rolling walker with 5" wheels    Recommendations for Other Services       Precautions / Restrictions Precautions Precautions: Fall Precaution Comments: watch O2    Mobility  Bed Mobility Overal bed mobility: Needs Assistance Bed Mobility: Supine to Sit     Supine to sit: Min assist        Transfers Overall transfer level: Needs assistance Equipment used: Rolling walker (2 wheeled) Transfers: Sit to/from Stand Sit to Stand: Min guard         General transfer comment: sit to stand x 5; cues for safe hand placement; required assist for toielting ADLS  Ambulation/Gait Ambulation/Gait assistance: Min assist Gait Distance (Feet): 40 Feet (10', 40'x2, 10') Assistive device: Rolling walker (2 wheeled) Gait Pattern/deviations: Step-through pattern;Decreased stride length;Antalgic Gait velocity: Decreased   General Gait Details: Frequent seated rest breaks; cues for RW use and to stay close to [employer] Mobility    Modified Rankin (Stroke Patients Only)       Balance Overall balance assessment: Needs assistance Sitting-balance support: No upper extremity supported Sitting balance-Leahy Scale: Good      Standing balance support: Bilateral upper extremity supported;During functional activity Standing balance-Leahy Scale: Poor Standing balance comment: Reliant on BUE support                             Cognition Arousal/Alertness: Awake/alert Behavior During Therapy: WFL for tasks assessed/performed Overall Cognitive Status: Within Functional Limits for tasks assessed Area of Impairment: Following commands;Problem solving                       Following Commands: Follows one step commands with increased time;Follows multi-step commands with increased time     Problem Solving: Slow processing;Requires verbal cues;Requires tactile cues        Exercises      General Comments General comments (skin integrity, edema, etc.): Pt on 4 L HFNC with sats 96% or >.  Discussed recommendations for frequent short bouts of ambulation.      Pertinent Vitals/Pain Pain Assessment: No/denies pain    Home Living                      Prior Function            PT Goals (current goals can now be found in the care plan section) Acute Rehab PT Goals Patient Stated Goal: to feel better PT Goal Formulation: With patient/family Time For Goal Achievement: 04/06/20 Potential to Achieve Goals: Good Progress towards PT goals: Progressing toward  goals    Frequency    Min 3X/week      PT Plan Current plan remains appropriate    Co-evaluation              AM-PAC PT "6 Clicks" Mobility   Outcome Measure  Help needed turning from your back to your side while in a flat bed without using bedrails?: A Little Help needed moving from lying on your back to sitting on the side of a flat bed without using bedrails?: A Little Help needed moving to and from a bed to a chair (including a wheelchair)?: A Little Help needed standing up from a chair using your arms (e.g., wheelchair or bedside chair)?: A Little Help needed to walk in hospital room?: A Little Help  needed climbing 3-5 steps with a railing? : A Lot 6 Click Score: 17    End of Session Equipment Utilized During Treatment: Gait belt;Oxygen Activity Tolerance: Patient tolerated treatment well Patient left: in chair;with call bell/phone within reach;with chair alarm set;with family/visitor present Nurse Communication: Mobility status PT Visit Diagnosis: Unsteadiness on feet (R26.81);Muscle weakness (generalized) (M62.81);Pain     Time: 1711-1751 PT Time Calculation (min) (ACUTE ONLY): 40 min  Charges:  $Gait Training: 23-37 mins $Therapeutic Activity: 8-22 mins                     Abran Richard, PT Acute Rehab Services Pager [phone] [facility] [place] 03/30/2020, 6:03 PM

## 2020-03-30 NOTE — Progress Notes (Signed)
Progress Note  Patient Name: Deanna [name] Date of Encounter: 03/30/2020  [facility] Cardiologist: Quay Burow, MD   Subjective   No acute events overnight. Remains on nasal cannula, breathing not yet at baseline. Doesn't feel that she has all of her strength back yet. Amenable to SNF to regain strength. No chest pain. Able to ambulate short distances but is very winded. Inpatient Medications    Scheduled Meds: . aspirin EC  81 mg Oral Daily  . brimonidine  1 drop Both Eyes BID  . Chlorhexidine Gluconate Cloth  6 each Topical Daily  . clopidogrel  75 mg Oral Daily  . divalproex  250 mg Oral BID  . dorzolamide  1 drop Both Eyes BID  . escitalopram  20 mg Oral Daily  . furosemide  40 mg Oral Daily  . heparin injection (subcutaneous)  5,000 Units Subcutaneous Q8H  . latanoprost  1 drop Both Eyes QHS  . levothyroxine  188 mcg Oral Q0600  . magnesium hydroxide  15 mL Oral Daily  . melatonin  6 mg Oral QHS  . metoprolol succinate  25 mg Oral Daily  . mirabegron ER  50 mg Oral Daily  . pantoprazole  40 mg Oral BID AC  . polyethylene glycol  17 g Oral Daily  . rosuvastatin  40 mg Oral Daily  . sodium chloride flush  3 mL Intravenous Q12H  . sodium chloride flush  3 mL Intravenous Q12H  . vitamin B-12  1,000 mcg Oral Daily   Continuous Infusions: . sodium chloride Stopped (03/22/20 0627)  . sodium chloride    . sodium chloride    . sodium chloride     PRN Meds: sodium chloride, Place/Maintain arterial line **AND** sodium chloride, acetaminophen, acetaminophen, albuterol, ALPRAZolam, gabapentin, nitroGLYCERIN, ondansetron (ZOFRAN) IV, sodium chloride flush, tiZANidine   Vital Signs    Vitals:   03/29/20 1950 03/29/20 1955 03/29/20 2137 03/30/20 0835  BP:  (!) 132/41  (!) 138/50  Pulse: 73 75 73 72  Resp:  16 18 15   Temp: 98 F (36.7 C)   98.2 F (36.8 C)  TempSrc: Oral   Oral  SpO2:  99% 95% 96%  Weight:      Height:        Intake/Output Summary (Last 24  hours) at 03/30/2020 0915 Last data filed at 03/29/2020 2200 Gross per 24 hour  Intake 963 ml  Output 800 ml  Net 163 ml   Last 3 Weights 03/29/2020 03/26/2020 03/25/2020  Weight (lbs) 201 lb 1.6 oz 202 lb 2.6 oz 212 lb 8.4 oz  Weight (kg) 91.218 kg 91.7 kg 96.4 kg  Some encounter information is confidential and restricted. Go to Review Flowsheets activity to see all data.      Telemetry    Sinus rhythm - Personally Reviewed  Physical Exam   GEN: Well nourished, well developed in no acute distress. [place] in place NECK: No JVD at 90 degrees CARDIAC: regular rhythm, normal S1 and S2, no rubs or gallops. 2/6 systolic murmur. VASCULAR: Radial pulses 2+ bilaterally.  RESPIRATORY:  Mildly diminished at bases, no rales or wheezing ABDOMEN: Soft, non-tender, non-distended MUSCULOSKELETAL:  Moves all 4 limbs independently SKIN: Warm and dry, no edema NEUROLOGIC:  No focal neuro deficits noted. PSYCHIATRIC:  Normal affect   Labs    High Sensitivity Troponin:   Recent Labs  Lab 03/18/20 1737 03/18/20 2248 03/19/20 0502 03/25/20 1405 03/25/20 2147  TROPONINIHS 4,011* 3,425* 3,370* 496* 463*  Chemistry Recent Labs  Lab 03/23/20 1155 03/24/20 1042 03/27/20 0438 03/28/20 1802 03/29/20 0405  NA  --    < > 132* 134* 135  K  --    < > 3.7 3.9 3.7  CL  --    < > 88* 91* 90*  CO2  --    < > 33* 30 32  GLUCOSE  --    < > 124* 146* 114*  BUN  --    < > 35* 36* 35*  CREATININE  --    < > 1.81* 1.83* 1.64*  CALCIUM  --    < > 9.0 9.3 9.3  PROT 5.4*  --   --   --   --   ALBUMIN 2.6*  --   --   --   --   AST 15  --   --   --   --   ALT 8  --   --   --   --   ALKPHOS 47  --   --   --   --   BILITOT 0.9  --   --   --   --   GFRNONAA  --    < > 26* 26* 30*  GFRAA  --    < > 30* 30* 34*  ANIONGAP  --    < > 11 13 13    < > = values in this interval not displayed.     Hematology Recent Labs  Lab 03/27/20 0438 03/28/20 1802 03/29/20 0405  WBC 7.6 6.8 6.5  RBC 3.81* 3.99 3.80*   HGB 10.4* 10.9* 10.4*  HCT 32.4* 34.1* 32.3*  MCV 85.0 85.5 85.0  MCH 27.3 27.3 27.4  MCHC 32.1 32.0 32.2  RDW 13.0 12.8 12.9  PLT 232 257 235    BNP Recent Labs  Lab 03/25/20 1405  BNP 1,834.3*     Radiology    No results found.  Patient Profile     79 y.o. 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  Echocardiogram showed ejection fraction 35 to 62%, grade 2 diastolic dysfunction, akinesis of the anteroseptal wall and apex, mild mitral regurgitation.  Cardiac catheterization revealed 99% proximal LAD with atretic LIMA to the LAD, 100% mid LAD, occluded circumflex, occluded right coronary artery.  Patient had PCI of the proximal LAD but mid LAD unsuccessful.  Medical therapy recommended for her residual coronary disease and for CHF. Patient was having left upper quadrant abdominal pain, EGD performed on 08/18, biopsy for HP taken. Post-procedurally, patient became short of breath and was found to have hypoxemia. She was placed on BiPAP with improvement of her SpO2. Chest x-ray revealed pulmonary edema, suggestive of acute CHF. Patient was hypotensive during episode, however, BP was taken on left arm, patient has history of left subclavian stenosis, thought to be the cause of the hypotension. Throughout patient's stay she has needed oxygen supplementation as she has episodes of hypoxia and shortness of breath with ambulation. PT recommend rehab at SNF with home health at home.  Assessment & Plan    Acute on chronic combined systolic and diastolic heart failure -patient developed CHF on 8/19 following EGD.   -net negative ~7 L -admission weight 95.5 kg, weight yesterday 91.2 kg. -Cr yesterday 1.64 -continue lasix 40 mg oral daily  NSTEMI -patient is status  post PCI of the proximal LAD but attempt at mid LAD  unsuccessful. -continue aspirin, clopidogrel -continue metoprolol succinate 25 mg daily -continue rosuvastatin 40 mg daily  ischemic cardiomyopathy -continue metoprolol succinate 25 mg daily -diastolic BP low. Monitor. If improved, add ARM  peripheral vascular disease -Do not check blood pressures in left arm. Known subclavian stenosis.   -continue aspirin, clopidogrel, statin  Hyperlipidemia -continue statin.  chronic stage III kidney disease -monitor Cr  Mild gastritis -Hgb stable  Disposition: pending SNF  For questions or updates, please contact [place] Please consult [email or web address] for contact info under    Buford Dresser, MD, PhD [facility]  [address] [place],  [address] [phone]  3:09 PM 03/30/20

## 2020-03-30 NOTE — Telephone Encounter (Signed)
error 

## 2020-03-31 LAB — CBC
HCT: 33.3 % — ABNORMAL LOW (ref 36.0–46.0)
Hemoglobin: 10.5 g/dL — ABNORMAL LOW (ref 12.0–15.0)
MCH: 27.3 pg (ref 26.0–34.0)
MCHC: 31.5 g/dL (ref 30.0–36.0)
MCV: 86.5 fL (ref 80.0–100.0)
Platelets: 248 10*3/uL (ref 150–400)
RBC: 3.85 MIL/uL — ABNORMAL LOW (ref 3.87–5.11)
RDW: 13.1 % (ref 11.5–15.5)
WBC: 6.2 10*3/uL (ref 4.0–10.5)
nRBC: 0 % (ref 0.0–0.2)

## 2020-03-31 LAB — BASIC METABOLIC PANEL
Anion gap: 13 (ref 5–15)
BUN: 32 mg/dL — ABNORMAL HIGH (ref 8–23)
CO2: 31 mmol/L (ref 22–32)
Calcium: 9.6 mg/dL (ref 8.9–10.3)
Chloride: 94 mmol/L — ABNORMAL LOW (ref 98–111)
Creatinine, Ser: 1.52 mg/dL — ABNORMAL HIGH (ref 0.44–1.00)
GFR calc Af Amer: 38 mL/min — ABNORMAL LOW (ref >60)
GFR calc non Af Amer: 32 mL/min — ABNORMAL LOW (ref >60)
Glucose, Bld: 123 mg/dL — ABNORMAL HIGH (ref 70–99)
Potassium: 4.2 mmol/L (ref 3.5–5.1)
Sodium: 138 mmol/L (ref 135–145)

## 2020-03-31 LAB — SARS CORONAVIRUS 2 BY RT PCR (HOSPITAL ORDER, PERFORMED IN ~~LOC~~ HOSPITAL LAB): SARS Coronavirus 2: NEGATIVE

## 2020-03-31 MED ORDER — NITROGLYCERIN 0.4 MG SL SUBL: 0.4000 mg | SUBLINGUAL_TABLET | SUBLINGUAL | 1 refills | Status: DC | PRN

## 2020-03-31 MED ORDER — DAPAGLIFLOZIN PROPANEDIOL 10 MG PO TABS
10.0000 mg | ORAL_TABLET | Freq: Every day | ORAL | Status: DC
  Administered 2020-03-31 – 2020-04-01 (×2): 10 mg via ORAL
  Filled 2020-03-31 (×2): qty 1

## 2020-03-31 MED ORDER — DAPAGLIFLOZIN PROPANEDIOL 10 MG PO TABS
10.0000 mg | ORAL_TABLET | Freq: Every day | ORAL | 2 refills | Status: DC
Start: 2020-04-01 — End: 2020-09-15

## 2020-03-31 MED ORDER — METOPROLOL SUCCINATE ER 25 MG PO TB24: 25.0000 mg | ORAL_TABLET | Freq: Every day | ORAL | 3 refills | Status: DC

## 2020-03-31 NOTE — Progress Notes (Signed)
RT note. Pt. On 3L [place] satting 99%. Pt. Refuse cpap tonight. RT will continue to monitor.

## 2020-03-31 NOTE — Progress Notes (Signed)
When notifying pt and daughter that it would likely be after [place] when PTAR came for transport pt became very upset and emotional, wanting to be moved in the am. Notified Ed Blalock PA, stated to discontinue discharge and pt would leave in the am. Carroll Kinds RN

## 2020-03-31 NOTE — Social Work (Signed)
EDCSW called [place] @ [phone] to update staff as to change in schedule for transfer of Pt. CSW left message with secure voicemail of admissions director. CSW will update TOC team to ensure continuity of care.  Vergie Living MSW LCSWA Transitions of Care  Clinical Social Worker  [facility] Emergency Departments  [phone]

## 2020-03-31 NOTE — Progress Notes (Signed)
[facility] notified to cancel transport for tonight, Sheilagh Harrington to notify facility. Jessie Foot, RN

## 2020-03-31 NOTE — TOC Progression Note (Addendum)
Transition of Care [facility]) - Progression Note    Patient Details  Name: Deanna [name] MRN: [id number] Date of Birth: [birth year]  Transition of Care [facility]) CM/SW [place], [place] Phone Number: 03/31/2020, 11:35 AM  Clinical Narrative:     CSW spoke with [place] with [facility]. Insurance authorization is still pending. Patient has regular [employer].  Patient has SNF bed at [facility]. Insurance authorization pending.  CSW will continue to follow.  Expected Discharge Plan: [place] Barriers to Discharge: Continued Medical Work up  Expected Discharge Plan and Services Expected Discharge Plan: [place] arrangements for the past 2 months: Single Family Home                                       Social Determinants of Health (SDOH) Interventions    Readmission Risk Interventions No flowsheet data found.

## 2020-03-31 NOTE — Progress Notes (Signed)
Resident Progress Note  Attending: Dr. 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  Echocardiogram showed ejection fraction 35 to 29%, grade 2 diastolic dysfunction, akinesis of the anteroseptal wall and apex, mild mitral regurgitation.  Cardiac catheterization revealed 99% proximal LAD with atretic LIMA to the LAD, 100% mid LAD, occluded circumflex, occluded right coronary artery.  Patient had PCI of the proximal LAD but mid LAD unsuccessful.  Medical therapy recommended for her residual coronary disease and for CHF. Patient was having left upper quadrant abdominal pain, EGD performed on 08/18, biopsy for HP taken. Post-procedurally, patient became short of breath and was found to have hypoxemia. She was placed on BiPAP with improvement of her SpO2. Chest x-ray revealed pulmonary edema, suggestive of acute CHF. Patient was hypotensive during episode, however, BP was taken on left arm, patient has history of left subclavian stenosis, thought to be the cause of the hypotension. Throughout patient's stay she has needed oxygen supplementation as she has episodes of hypoxia and shortness of breath with ambulation. PT recommend rehab at SNF with home health at home.  Subjective:   Deanna [name] is sitting up in bed, eating breakfast. She states she is doing much better today. She denies shortness of breath or chest pain, and is currently on 4L [place]. She states her physical therapy session went better yesterday, she was able to walk farther with less interruptions and was not as short of breath. She states she slept with her CPAP on last night. She denies fever, chills, cough. She denies difficulty passing her bowels and denies difficulty urinating. She is eating and drinking appropriately. She  has no complaints or concerns at this time.   Objective:  Vital signs in last 24 hours: Vitals:   03/30/20 0800 03/30/20 0835 03/30/20 2025 03/31/20 0500  BP:  (!) 138/50  (!) 90/56  Pulse: 72 72  74  Resp: (!) 25 15  18   Temp:  98.2 F (36.8 C) 98 F (36.7 C) 98.2 F (36.8 C)  TempSrc:  Oral Oral Oral  SpO2: 94% 96%    Weight:      Height:       Physical Exam Vitals and nursing note reviewed.  Constitutional:      General: She is not in acute distress.    Appearance: She is well-developed. She is not ill-appearing, toxic-appearing or diaphoretic.  HENT:     Head: Normocephalic and atraumatic.  Eyes:     Extraocular Movements: Extraocular movements intact.  Cardiovascular:     Rate and Rhythm: Normal rate and regular rhythm.     Heart sounds: No murmur heard.  No friction rub. No gallop.      Comments: When auscultating in the left 2nd intercostal space, patient's left subclavian stenosis can be heard. Followed course of vessel and able to auscultate and appreciate throughout.  Pulmonary:     Effort: Pulmonary effort is normal.     Breath sounds: Examination of the right-lower field reveals wheezing. Examination of the left-lower field reveals wheezing. Wheezing present. No decreased breath sounds, rhonchi or rales.  Musculoskeletal:     Right lower leg: No edema.     Left lower leg: No edema.  Neurological:     General: No focal deficit present.     Mental Status: She is  alert and oriented to person, place, and time.  Psychiatric:        Mood and Affect: Mood normal.        Behavior: Behavior normal.    Assessment/Plan:  Acute on chronic combined systolic and diastolic heart failure -patient developed CHF on 8/19 following EGD.   -net negative ~ - 600 yesterday -admission weight 95.5 kg, weight 2 days ago, 91.2 kg. -Cr yesterday 1.52 from 1.64 yesterday -continue lasix 40 mg oral daily - Will add Farxiga 10 mg tab to patient's regimen. Believe she will benefit  from this addition. Discussed with patient the benefit of this medication for heart failure as well as the risks of uti and yeast infection. Patient acknowledges both and nods in agreement to be cautious for UTI's/yeast infections - Also consider starting entresto on an outpatient basis. Patient has had soft BP's while being admitted so will not start during her hospital admission, however, do believe she would benefit. Cr stable per above.   NSTEMI -patient is status post PCI of the proximal LAD but attempt at mid LAD unsuccessful. -continue aspirin, clopidogrel -continue metoprolol succinate 25 mg daily -continue rosuvastatin 40 mg daily  ischemic cardiomyopathy -continue metoprolol succinate 25 mg daily -diastolic BP low. Monitor. If improved, add ARM  peripheral vascular disease -Do not check blood pressures in left arm. Known subclavian stenosis.   - Appreciated subclavian stenosis on auscultation. -continue aspirin, clopidogrel, statin  Hyperlipidemia -continue statin.  chronic stage III kidney disease -monitor Cr  Mild gastritis -Hgb stable, 10.3-10.9.   Anticipated Discharge Location: SNF Barriers to Discharge: Insurance Approval for SNF Dispo: Anticipated discharge in approximately 1-2 days Follow Up: Appointment on September 2nd with NP Venita Sheffield, MD 03/31/2020, 8:35 AM Attending: Dr. Buford Dresser

## 2020-03-31 NOTE — TOC Transition Note (Addendum)
Transition of Care [facility]) - CM/SW Discharge Note   Patient Details  Name: Deanna [name] MRN: [id number] Date of Birth: 10/11/1940  [facility] CM/SW Contact:  Bethena Roys, RN Phone Number: 03/31/2020, 5:55 PM   Clinical Narrative: Case Manager spoke with PA and patient will transition to [facility] today. Discharge summary and order placed in Epic. Case Manager sent discharge summary via the hub-[place] Liaison has received the summary. [facility] has a bed and previous Clinical Education officer, museum received insurnace authorization. Staff RN will need to call [phone] for report. Patient will transition to [facility] [id number]. Family is aware of the plan to transition to facility. Rapid COVID has been ordered and needs to be collected. Case Manager to call [facility] for transportation to SNF. No further needs at this time.   03-31-20 1857 Case Manager called [facility] for transport to SNF-per [facility] it may be after midnight before the patient is picked up. Staff aware to call report.   Final next level of care: Skilled Nursing Facility Barriers to Discharge: No Barriers Identified   Patient Goals and CMS Choice Patient states their goals for this hospitalization and ongoing recovery are:: to go to SNF CMS Medicare.gov Compare Post Acute Care list provided to:: Patient Choice offered to / list presented to : Patient  Discharge Placement              Patient chooses bed at: [facility] Patient to be transferred to facility by: [place] Name of family member notified: [name] Patient and family notified of of transfer: 03/31/20  Readmission Risk Interventions No flowsheet data found.

## 2020-04-01 DIAGNOSIS — D649 Anemia, unspecified: Secondary | ICD-10-CM | POA: Diagnosis not present

## 2020-04-01 DIAGNOSIS — R278 Other lack of coordination: Secondary | ICD-10-CM | POA: Diagnosis not present

## 2020-04-01 DIAGNOSIS — I255 Ischemic cardiomyopathy: Secondary | ICD-10-CM

## 2020-04-01 DIAGNOSIS — I480 Paroxysmal atrial fibrillation: Secondary | ICD-10-CM | POA: Diagnosis not present

## 2020-04-01 DIAGNOSIS — R0602 Shortness of breath: Secondary | ICD-10-CM | POA: Diagnosis not present

## 2020-04-01 DIAGNOSIS — D6489 Other specified anemias: Secondary | ICD-10-CM | POA: Diagnosis not present

## 2020-04-01 DIAGNOSIS — M6281 Muscle weakness (generalized): Secondary | ICD-10-CM | POA: Diagnosis not present

## 2020-04-01 DIAGNOSIS — R2681 Unsteadiness on feet: Secondary | ICD-10-CM | POA: Diagnosis not present

## 2020-04-01 DIAGNOSIS — R41841 Cognitive communication deficit: Secondary | ICD-10-CM | POA: Diagnosis not present

## 2020-04-01 DIAGNOSIS — Z7401 Bed confinement status: Secondary | ICD-10-CM | POA: Diagnosis not present

## 2020-04-01 DIAGNOSIS — I10 Essential (primary) hypertension: Secondary | ICD-10-CM | POA: Diagnosis not present

## 2020-04-01 DIAGNOSIS — R5381 Other malaise: Secondary | ICD-10-CM | POA: Diagnosis not present

## 2020-04-01 DIAGNOSIS — R4189 Other symptoms and signs involving cognitive functions and awareness: Secondary | ICD-10-CM | POA: Diagnosis not present

## 2020-04-01 DIAGNOSIS — E782 Mixed hyperlipidemia: Secondary | ICD-10-CM | POA: Diagnosis not present

## 2020-04-01 DIAGNOSIS — Z79899 Other long term (current) drug therapy: Secondary | ICD-10-CM | POA: Diagnosis not present

## 2020-04-01 DIAGNOSIS — K2970 Gastritis, unspecified, without bleeding: Secondary | ICD-10-CM | POA: Diagnosis not present

## 2020-04-01 DIAGNOSIS — I5043 Acute on chronic combined systolic (congestive) and diastolic (congestive) heart failure: Secondary | ICD-10-CM | POA: Diagnosis not present

## 2020-04-01 DIAGNOSIS — F4320 Adjustment disorder, unspecified: Secondary | ICD-10-CM | POA: Diagnosis not present

## 2020-04-01 DIAGNOSIS — J3489 Other specified disorders of nose and nasal sinuses: Secondary | ICD-10-CM | POA: Diagnosis not present

## 2020-04-01 DIAGNOSIS — R69 Illness, unspecified: Secondary | ICD-10-CM | POA: Diagnosis not present

## 2020-04-01 DIAGNOSIS — I214 Non-ST elevation (NSTEMI) myocardial infarction: Secondary | ICD-10-CM | POA: Diagnosis not present

## 2020-04-01 DIAGNOSIS — R2689 Other abnormalities of gait and mobility: Secondary | ICD-10-CM | POA: Diagnosis not present

## 2020-04-01 DIAGNOSIS — R498 Other voice and resonance disorders: Secondary | ICD-10-CM | POA: Diagnosis not present

## 2020-04-01 DIAGNOSIS — I2510 Atherosclerotic heart disease of native coronary artery without angina pectoris: Secondary | ICD-10-CM | POA: Diagnosis not present

## 2020-04-01 DIAGNOSIS — R1012 Left upper quadrant pain: Secondary | ICD-10-CM | POA: Diagnosis not present

## 2020-04-01 DIAGNOSIS — I5189 Other ill-defined heart diseases: Secondary | ICD-10-CM | POA: Diagnosis not present

## 2020-04-01 DIAGNOSIS — I25708 Atherosclerosis of coronary artery bypass graft(s), unspecified, with other forms of angina pectoris: Secondary | ICD-10-CM | POA: Diagnosis not present

## 2020-04-01 DIAGNOSIS — I509 Heart failure, unspecified: Secondary | ICD-10-CM | POA: Diagnosis not present

## 2020-04-01 DIAGNOSIS — F411 Generalized anxiety disorder: Secondary | ICD-10-CM | POA: Diagnosis not present

## 2020-04-01 DIAGNOSIS — M2550 Pain in unspecified joint: Secondary | ICD-10-CM | POA: Diagnosis not present

## 2020-04-01 DIAGNOSIS — I428 Other cardiomyopathies: Secondary | ICD-10-CM | POA: Diagnosis not present

## 2020-04-01 DIAGNOSIS — J9601 Acute respiratory failure with hypoxia: Secondary | ICD-10-CM | POA: Diagnosis not present

## 2020-04-01 DIAGNOSIS — Z20822 Contact with and (suspected) exposure to covid-19: Secondary | ICD-10-CM | POA: Diagnosis not present

## 2020-04-01 DIAGNOSIS — G458 Other transient cerebral ischemic attacks and related syndromes: Secondary | ICD-10-CM | POA: Diagnosis not present

## 2020-04-01 DIAGNOSIS — I5033 Acute on chronic diastolic (congestive) heart failure: Secondary | ICD-10-CM | POA: Diagnosis not present

## 2020-04-01 DIAGNOSIS — Z955 Presence of coronary angioplasty implant and graft: Secondary | ICD-10-CM | POA: Diagnosis not present

## 2020-04-01 DIAGNOSIS — E038 Other specified hypothyroidism: Secondary | ICD-10-CM | POA: Diagnosis not present

## 2020-04-01 DIAGNOSIS — F331 Major depressive disorder, recurrent, moderate: Secondary | ICD-10-CM | POA: Diagnosis not present

## 2020-04-01 DIAGNOSIS — I7389 Other specified peripheral vascular diseases: Secondary | ICD-10-CM | POA: Diagnosis not present

## 2020-04-01 LAB — CBC
HCT: 32.7 % — ABNORMAL LOW (ref 36.0–46.0)
Hemoglobin: 10.3 g/dL — ABNORMAL LOW (ref 12.0–15.0)
MCH: 27.5 pg (ref 26.0–34.0)
MCHC: 31.5 g/dL (ref 30.0–36.0)
MCV: 87.4 fL (ref 80.0–100.0)
Platelets: 231 10*3/uL (ref 150–400)
RBC: 3.74 MIL/uL — ABNORMAL LOW (ref 3.87–5.11)
RDW: 13.2 % (ref 11.5–15.5)
WBC: 6.4 10*3/uL (ref 4.0–10.5)
nRBC: 0 % (ref 0.0–0.2)

## 2020-04-01 LAB — BASIC METABOLIC PANEL
Anion gap: 10 (ref 5–15)
BUN: 29 mg/dL — ABNORMAL HIGH (ref 8–23)
CO2: 30 mmol/L (ref 22–32)
Calcium: 9.3 mg/dL (ref 8.9–10.3)
Chloride: 95 mmol/L — ABNORMAL LOW (ref 98–111)
Creatinine, Ser: 1.46 mg/dL — ABNORMAL HIGH (ref 0.44–1.00)
GFR calc Af Amer: 40 mL/min — ABNORMAL LOW (ref >60)
GFR calc non Af Amer: 34 mL/min — ABNORMAL LOW (ref >60)
Glucose, Bld: 139 mg/dL — ABNORMAL HIGH (ref 70–99)
Potassium: 4.4 mmol/L (ref 3.5–5.1)
Sodium: 135 mmol/L (ref 135–145)

## 2020-04-01 MED ORDER — FUROSEMIDE 40 MG PO TABS: 40.0000 mg | ORAL_TABLET | Freq: Every day | ORAL | 11 refills | Status: DC

## 2020-04-01 MED ORDER — VALSARTAN 40 MG PO TABS
40.0000 mg | ORAL_TABLET | Freq: Every morning | ORAL | 1 refills | Status: DC
Start: 2020-04-01 — End: 2020-05-21

## 2020-04-01 NOTE — Progress Notes (Signed)
All  Discharge information including medication, prescription medication and follow- up provided to Nurse at [facility].  IV removed and belongings returned to patient. No further question ask. Patient transported to SNF via [facility]

## 2020-04-01 NOTE — Discharge Summary (Addendum)
  Addendum to previous D/C Summary.  Pt unable to go last pm due to transport not available.  Pt stable this am, w/out CP/SOB. Eager to go and get stronger w/ rehab.   Vitals with BMI 04/01/2020 03/31/2020 03/31/2020  Height - - -  Weight 199 lbs 5 oz - -  BMI 81.85 - -  Systolic 909 311 216  Diastolic 66 78 54  Pulse 75 71 71  Some encounter information is confidential and restricted. Go to Review Flowsheets activity to see all data.   GEN: No acute distress.   Neck: No JVD Cardiac: RRR, no murmur, no rubs, or gallops.  Respiratory: clear bilaternally   GI: Soft, nontender, non-distended  MS: No edema; No deformity. Neuro:  Nonfocal  Psych: Normal affect   Plan:  D/c to [facility] as previously planned. Daily wts  Otherwise, see D/C Summary from 03/31/2020.  Rosaria Ferries, PA-C 04/01/2020 7:56 AM

## 2020-04-01 NOTE — TOC Transition Note (Signed)
Transition of Care [facility]) - CM/SW Discharge Note   Patient Details  Name: Deanna [name] MRN: [id number] Date of Birth: 12/16/1940  Transition of Care [facility] CM/SW Contact:  Trula Ore, [place] Phone Number: 04/01/2020, 10:16 AM   Clinical Narrative:     Patient will DC to: [facility] date: 04/01/2020  Family notified: [name]  Transport by: Corey Harold  ?  Per MD patient ready for DC to [facility] . RN, patient, patient's family, and facility notified of DC. Discharge Summary sent to facility. RN given number for report tele#[phone] DH#[id number]. DC packet on chart. Ambulance transport requested for patient.  CSW signing off.  Final next level of care: Skilled Nursing Facility Barriers to Discharge: No Barriers Identified   Patient Goals and CMS Choice Patient states their goals for this hospitalization and ongoing recovery are:: to go to SNF CMS Medicare.gov Compare Post Acute Care list provided to:: Patient Choice offered to / list presented to : Patient  Discharge Placement              Patient chooses bed at: [facility] Patient to be transferred to facility by: [place] Name of family member notified: [name] Patient and family notified of of transfer: 04/01/20  Discharge Plan and Services                                     Social Determinants of Health (SDOH) Interventions     Readmission Risk Interventions No flowsheet data found.

## 2020-04-02 DIAGNOSIS — Z955 Presence of coronary angioplasty implant and graft: Secondary | ICD-10-CM | POA: Diagnosis not present

## 2020-04-02 DIAGNOSIS — I7389 Other specified peripheral vascular diseases: Secondary | ICD-10-CM | POA: Diagnosis not present

## 2020-04-02 DIAGNOSIS — R4189 Other symptoms and signs involving cognitive functions and awareness: Secondary | ICD-10-CM | POA: Diagnosis not present

## 2020-04-02 DIAGNOSIS — D649 Anemia, unspecified: Secondary | ICD-10-CM | POA: Diagnosis not present

## 2020-04-02 DIAGNOSIS — K2970 Gastritis, unspecified, without bleeding: Secondary | ICD-10-CM | POA: Diagnosis not present

## 2020-04-02 DIAGNOSIS — I214 Non-ST elevation (NSTEMI) myocardial infarction: Secondary | ICD-10-CM | POA: Diagnosis not present

## 2020-04-02 DIAGNOSIS — R1012 Left upper quadrant pain: Secondary | ICD-10-CM | POA: Diagnosis not present

## 2020-04-02 DIAGNOSIS — I2510 Atherosclerotic heart disease of native coronary artery without angina pectoris: Secondary | ICD-10-CM | POA: Diagnosis not present

## 2020-04-02 DIAGNOSIS — I509 Heart failure, unspecified: Secondary | ICD-10-CM | POA: Diagnosis not present

## 2020-04-02 NOTE — Telephone Encounter (Signed)
Patient contacted regarding discharge from [facility] on 04/01/20.  Patient understands to follow up with provider [place] on 04/09/20 at 2:15 pm at [facility]. Patient understands discharge instructions? unknown Patient understands medications and regiment? unknown Patient understands to bring all medications to this visit? unknown  Ask patient:  Are you enrolled in My Chart yes   If no ask patient if they would like to enroll.   Spoke with pt daughter, the patient is at [facility]. She does not really know anything about the discharge instructions or medications. She is aware of the follow up appointment.

## 2020-04-06 NOTE — Progress Notes (Deleted)
Cardiology Clinic Note   Patient Name: Deanna [name] Date of Encounter: 04/06/2020  Primary Care Provider:  Lauree Chandler, NP Primary Cardiologist:  Quay Burow, MD  Patient Profile    ***  Past Medical History    Past Medical History:  Diagnosis Date  . AMI 11/17/2008   Qualifier: Diagnosis of  By: Marland Mcalpine    . Anxiety   . Atherosclerotic peripheral vascular disease ([place])   . Bacteremia, escherichia coli 04/27/2015  . Brachial-basilar insufficiency syndrome   . CAD (coronary artery disease)    followed by dr [name].  . Celiac artery stenosis ([place])   . CHF (congestive heart failure) ([place]) 11/14/2017  . Chronic bilateral low back pain without sciatica   . Cognitive impairment   . Colon polyps   . Community acquired pneumonia   . Depression   . Diverticulosis   . Dysuria   . Encephalomalacia   . GAD (generalized anxiety disorder)   . GERD (gastroesophageal reflux disease)   . Glaucoma   . Hearing loss   . Hemorrhoids   . Hypertension   . Hypothyroidism   . Incontinence   . Mixed hyperlipidemia   . Myocardial infarction ([place])   . OSA on CPAP   . Peripheral arterial disease ([place])   . Pneumonitis 04/26/2015  . Pyelonephritis   . Pyelonephritis due to Escherichia coli 04/28/2015  . Sepsis ([place])   . Sinus drainage    took z-pack   finished yesterday  . Spondylosis of lumbar region without myelopathy or radiculopathy   . 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[place])   . Urticaria   . Vertigo, benign positional    Past Surgical History:  Procedure Laterality Date  . ABDOMINAL AORTOGRAM W/LOWER EXTREMITY N/A 08/22/2017   Procedure: ABDOMINAL AORTOGRAM W/LOWER EXTREMITY;  Surgeon: Serafina Mitchell, MD;  Location: [place] [facility];  Service: Cardiovascular;  Laterality: N/A;  . BILATERAL UPPER EXTREMITY ANGIOGRAM N/A 09/11/2012   Procedure: BILATERAL UPPER EXTREMITY ANGIOGRAM;  Surgeon: Serafina Mitchell, MD;  Location: [facility] CATH LAB;  Service: Cardiovascular;   Laterality: N/A;  . BIOPSY  03/25/2020   Procedure: BIOPSY;  Surgeon: Irving Copas., MD;  Location: [place];  Service: Gastroenterology;;  . carotid duplex doppler Bilateral 09/03/2012, 11/03/2011   Evidence of 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 patent left common carotid- subclavian artery bypass with turbulent flow noted at the anastomosis with velocities of 295 cm/s  . CAROTID-SUBCLAVIAN BYPASS GRAFT  12/15/2011   Procedure: BYPASS GRAFT CAROTID-SUBCLAVIAN;  Surgeon: Serafina Mitchell, MD;  Location: [facility] OR;  Service: Vascular;  Laterality: Left;  Left Carotid subclavian bypass  . CORONARY ANGIOPLASTY    . CORONARY ARTERY BYPASS GRAFT    . CORONARY ATHERECTOMY N/A 03/19/2020   Procedure: CORONARY ATHERECTOMY;  Surgeon: Jettie Booze, MD;  Location: [place] [facility];  Service: Cardiovascular;  Laterality: N/A;  . CORONARY STENT INTERVENTION N/A 03/19/2020   Procedure: CORONARY STENT INTERVENTION;  Surgeon: Jettie Booze, MD;  Location: [place] [facility];  Service: Cardiovascular;  Laterality: N/A;  . DOPPLER ECHOCARDIOGRAPHY  05/27/2010, 09/17/2008   Mild Proximal septal thickening is noted. Left ventricular systolic functions is normal ejection fraction =>55%. the aortic valve appears to be mildly sclerotic   . ESOPHAGOGASTRODUODENOSCOPY (EGD) WITH PROPOFOL N/A 03/25/2020   Procedure: ESOPHAGOGASTRODUODENOSCOPY (EGD) WITH PROPOFOL;  Surgeon: Irving Copas., MD;  Location: [facility];  Service: Gastroenterology;  Laterality: N/A;  . fem-fem bypass graft  1999  . holter monitor  01/21/2008   The predominant rhythm was normal sinus rhythm. Minimum heartrate of 63 bpm at +01:00, maximum heartrate of 105 bpm at + 10:35; and the average heartrate of 75 bpm. Ventricular ectopic activity totaled 1270: Multifocal; 866-PVC's  and 404-VEs             . JOINT REPLACEMENT     Left knee  . LEFT HEART CATH AND CORS/GRAFTS ANGIOGRAPHY N/A 03/19/2020   Procedure: LEFT HEART CATH AND CORS/GRAFTS ANGIOGRAPHY;  Surgeon: Jettie Booze, MD;  Location: [place] [facility];  Service: Cardiovascular;  Laterality: N/A;  . LEFT HEART CATHETERIZATION WITH CORONARY/GRAFT ANGIOGRAM N/A 12/21/2011   Procedure: LEFT HEART CATHETERIZATION WITH Beatrix Fetters;  Surgeon: Leonie Man, MD;  Location: [facility] C[facility] LAB;  Service: Cardiovascular;  Laterality: N/A;  . NM MYOCAR PERF EJECTION FRACTION  09/22/2009, 07/03/2007   the post stress myocardial perfusion images show a normal pattern of perfusion is all regions. The post-stress ejection fraction is 68 %. no significant wall motion abnormalities noted. This is a low risk scan.  Marland Kitchen PERIPHERAL VASCULAR INTERVENTION  08/22/2017   Procedure: PERIPHERAL VASCULAR INTERVENTION;  Surgeon: Serafina Mitchell, MD;  Location: [place] [facility];  Service: Cardiovascular;;  Fem/Fem Graft  . REPLACEMENT TOTAL KNEE  05-2011  . UNILATERAL UPPER EXTREMEITY ANGIOGRAM Left 11/15/2011   Procedure: UNILATERAL UPPER EXTREMEITY ANGIOGRAM;  Surgeon: Lorretta Harp, MD;  Location: [facility] CATH LAB;  Service: Cardiovascular;  Laterality: Left;    Allergies  Allergies  Allergen Reactions  . Donepezil Other (See Comments)    Altered mood, aggression, and caused anger  . Duloxetine Hcl     Increased confusion and memory concerns   . Meloxicam     Pt suspects that this medicine causes fluid on her lungs.   . Memantine Other (See Comments)    Caused severe aggression  . 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 Vicodin [Hydrocodone-Acetaminophen] Other (See Comments)    Delirium, confusion, and toxic dementia    History of Present Illness    ***  Home Medications    Prior to Admission medications   Medication Sig Start Date End Date Taking?  Authorizing Provider  acetaminophen (TYLENOL) 325 MG tablet Take 325 mg by mouth in the morning and at bedtime. 1 by mouth in the morning and 1 by mouth in the pm.    [provider]  ALPRAZolam Duanne Moron) 0.5 MG tablet Take 1 tablet (0.5 mg total) by mouth daily as needed for anxiety. 02/26/20   Thayer Headings, PMHNP  aspirin EC 81 MG tablet Take 81 mg by mouth at bedtime.     [provider]  brimonidine (ALPHAGAN) 0.2 % ophthalmic solution Place 1 drop into both eyes 2 (two) times daily.    [provider]  calcium carbonate (TUMS - DOSED IN MG ELEMENTAL CALCIUM) 500 MG chewable tablet Chew 1 tablet by mouth daily as needed for indigestion or heartburn.    [provider]  cholecalciferol (VITAMIN D) 400 units TABS tablet Take 400 Units by mouth daily.    [provider]  clopidogrel (PLAVIX) 75 MG tablet TAKE 1 TABLET (75 MG TOTAL) BY MOUTH DAILY. 11/25/19   Lorretta Harp, MD  dapagliflozin propanediol (FARXIGA) 10 MG TABS tablet Take 1 tablet (10 mg total)  by mouth daily. 04/01/20   Riesa Pope, MD  divalproex (DEPAKOTE ER) 250 MG 24 hr tablet TAKE 1 TABLET(250 MG) BY MOUTH TWICE DAILY 02/26/20   Thayer Headings, PMHNP  dorzolamide (TRUSOPT) 2 % ophthalmic solution Place 1 drop into both eyes 2 (two) times daily.    [provider]  escitalopram (LEXAPRO) 20 MG tablet TAKE 1 TABLET(20 MG) BY MOUTH DAILY Patient taking differently: Take 20 mg by mouth daily.  02/26/20   Thayer Headings, PMHNP  furosemide (LASIX) 40 MG tablet Take 1 tablet (40 mg total) by mouth daily. Please continue daily dosing until your follow up cardiology appt. 04/01/20 04/01/21  Buford Dresser, MD  gabapentin (NEURONTIN) 100 MG capsule Take 1 capsule (100 mg total) by mouth 2 (two) times daily as needed. Patient taking differently: Take 200 mg by mouth 2 (two) times daily as needed (For nerve pain).  08/16/19   Lauree Chandler, NP  latanoprost (XALATAN)  0.005 % ophthalmic solution Place 1 drop into both eyes at bedtime.    [provider]  levothyroxine (SYNTHROID) 100 MCG tablet Take 1 tablet (100 mcg total) by mouth daily before breakfast. To take with 88 mcg tablet to equal 188 mcg total 12/13/19   Lauree Chandler, NP  levothyroxine (SYNTHROID) 88 MCG tablet Take 1 tablet (88 mcg total) by mouth daily before breakfast. To take with 100 mcg tablet to equal 188 mcg total 12/13/19   Lauree Chandler, NP  meclizine (ANTIVERT) 25 MG tablet Take 1 tablet (25 mg total) by mouth as needed for dizziness. 11/30/18   Lauree Chandler, NP  metoprolol succinate (TOPROL-XL) 25 MG 24 hr tablet Take 1 tablet (25 mg total) by mouth daily. 04/01/20   Furth, Cadence H, PA-C  mirabegron ER (MYRBETRIQ) 50 MG TB24 tablet Take 50 mg by mouth daily.    [provider]  nitroGLYCERIN (NITROSTAT) 0.4 MG SL tablet Place 1 tablet (0.4 mg total) under the tongue every 5 (five) minutes x 3 doses as needed for chest pain. 03/31/20   Katsadouros, Vasilios, MD  pantoprazole (PROTONIX) 40 MG tablet TAKE 1 TABLET EVERY DAY Patient taking differently: Take 40 mg by mouth daily.  11/25/19   Lauree Chandler, NP  rosuvastatin (CRESTOR) 40 MG tablet Take 1 tablet (40 mg total) by mouth daily. 08/23/19   Lauree Chandler, NP  tiZANidine (ZANAFLEX) 2 MG tablet Take 1 tablet (2 mg total) by mouth every 6 (six) hours as needed for muscle spasms. 07/01/19   Ngetich, Dinah C, NP  valsartan (DIOVAN) 40 MG tablet Take 1 tablet (40 mg total) by mouth every morning. 04/01/20   Buford Dresser, MD  vitamin B-12 (CYANOCOBALAMIN) 1000 MCG tablet Take 1,000 mcg by mouth daily.    [provider]    Family History    Family History  Problem Relation Age of Onset  . Heart attack Mother   . Heart disease Mother        before age 70  . Diabetes Father   . Heart disease Father   . Hypertension Father   . Hyperlipidemia Father   . Heart attack Father 48  .  Heart attack Brother 55  . Cerebral palsy Sister 22  . Congestive Heart Failure Sister 50  . Heart attack Sister 7  . Hypertension Sister 18  . Dementia Sister   . Anxiety disorder Sister   . Anxiety disorder Sister 53  . Heart Problems Sister   . Stroke Sister   .  Heart Problems Sister 52  . Heart attack Sister 29  . Colon cancer Brother 100  . Prostate cancer Brother 55  . Hypertension Daughter   . Irritable bowel syndrome Daughter   . Depression Daughter   . Anxiety disorder Daughter    She indicated that her mother is deceased. She indicated that her father is deceased. She indicated that two of her seven sisters are alive. She indicated that only one of her three brothers is alive. She indicated that her daughter is alive.  Social History    Social History   Socioeconomic History  . Marital status: Divorced    Spouse name: Not on file  . Number of children: Not on file  . Years of education: Not on file  . Highest education level: Not on file  Occupational History  . Not on file  Tobacco Use  . Smoking status: Former Smoker    Years: 3.00    Types: Cigarettes    Quit date: 11/27/1981    Years since quitting: 38.3  . Smokeless tobacco: Never Used  Vaping Use  . Vaping Use: Never used  Substance and Sexual Activity  . Alcohol use: Not Currently    Alcohol/week: 0.0 standard drinks  . Drug use: No  . Sexual activity: Not Currently    Comment: 1st intercourse 87 yo-1 partner  Other Topics Concern  . Not on file  Social History Narrative   Social History      Diet? Healthy but too many sweets      Do you drink/eat things with caffeine? Yes occasionally      Marital status?                    D                What year were you married?      Do you live in a house, apartment, assisted living, condo, trailer, etc.? condo      Is it one or more stories? 1      How many persons live in your home? 2      Do you have any pets in your home? (please list) 1 cat       Highest level of education completed? 1 year college      Current or past profession: housewife      Do you exercise?           no                           Type & how often?      Advanced Directives      Do you have a living will? no      Do you have a DNR form?             no                     If not, do you want to discuss one? yes      Do you have signed POA/HPOA for forms? no      Functional Status      Do you have difficulty bathing or dressing yourself? No- gets tired      Do you have difficulty preparing food or eating? No- eats frozen entrees or poor nutrition meals      Do you have difficulty managing your medications? yes      Do  you have difficulty managing your finances? yes      Do you have difficulty affording your medications? No- funds running low   Social Determinants of Health   Financial Resource Strain:   . Difficulty of Paying Living Expenses: Not on file  Food Insecurity:   . Worried About Charity fundraiser in the Last Year: Not on file  . Ran Out of Food in the Last Year: Not on file  Transportation Needs:   . Lack of Transportation (Medical): Not on file  . Lack of Transportation (Non-Medical): Not on file  Physical Activity:   . Days of Exercise per Week: Not on file  . Minutes of Exercise per Session: Not on file  Stress:   . Feeling of Stress : Not on file  Social Connections:   . Frequency of Communication with Friends and Family: Not on file  . Frequency of Social Gatherings with Friends and Family: Not on file  . Attends Religious Services: Not on file  . Active Member of Clubs or Organizations: Not on file  . Attends Archivist Meetings: Not on file  . Marital Status: Not on file  Intimate Partner Violence:   . Fear of Current or Ex-Partner: Not on file  . Emotionally Abused: Not on file  . Physically Abused: Not on file  . Sexually Abused: Not on file     Review of Systems    General:  No chills, fever,  night sweats or weight changes.  Cardiovascular:  No chest pain, dyspnea on exertion, edema, orthopnea, palpitations, paroxysmal nocturnal dyspnea. Dermatological: No rash, lesions/masses Respiratory: No cough, dyspnea Urologic: No hematuria, dysuria Abdominal:   No nausea, vomiting, diarrhea, bright red blood per rectum, melena, or hematemesis Neurologic:  No visual changes, wkns, changes in mental status. All other systems reviewed and are otherwise negative except as noted above.  Physical Exam    VS:  There were no vitals taken for this visit. , BMI There is no height or weight on file to calculate BMI. GEN: Well nourished, well developed, in no acute distress. HEENT: normal. Neck: Supple, no JVD, carotid bruits, or masses. Cardiac: RRR, no murmurs, rubs, or gallops. No clubbing, cyanosis, edema.  Radials/DP/PT 2+ and equal bilaterally.  Respiratory:  Respirations regular and unlabored, clear to auscultation bilaterally. GI: Soft, nontender, nondistended, BS + x 4. MS: no deformity or atrophy. Skin: warm and dry, no rash. Neuro:  Strength and sensation are intact. Psych: Normal affect.  Accessory Clinical Findings    Recent Labs: 03/18/2020: TSH 0.394 03/23/2020: ALT 8 03/25/2020: B Natriuretic Peptide 1,834.3 03/30/2020: Magnesium 2.5 04/01/2020: BUN 29; Creatinine, Ser 1.46; Hemoglobin 10.3; Platelets 231; Potassium 4.4; Sodium 135   Recent Lipid Panel    Component Value Date/Time   CHOL 90 03/19/2020 0343   TRIG 172 (H) 03/19/2020 0343   HDL 26 (L) 03/19/2020 0343   CHOLHDL 3.5 03/19/2020 0343   VLDL 34 03/19/2020 0343   LDLCALC 30 03/19/2020 0343   LDLCALC 74 12/11/2019 1055    ECG personally reviewed by me today- *** - No acute changes  Assessment & Plan   1.  ***   Jossie Ng. Chiana Wamser NP-C    04/06/2020, 2:05 PM [address] Office [phone] Fax [phone]  Notice: This dictation was prepared with  Dragon dictation along with smaller phrase technology. Any transcriptional errors that result from this process are unintentional and may not be corrected upon review.

## 2020-04-07 ENCOUNTER — Telehealth (HOSPITAL_COMMUNITY): Payer: Self-pay

## 2020-04-07 NOTE — Telephone Encounter (Signed)
Pt insurance is active and benefits verified through [facility]. Co-pay $10.00, DED $0.00/$0.00 met, out of pocket $3,900.00/$305.28 met, co-insurance 0%. No pre-authorization required. Passport, 04/07/20 @ 10:48AM, CHY#[id number]  Will contact patient to see if she is interested in the Cardiac Rehab Program. If interested, patient will need to complete follow up appt. Once completed, patient will be contacted for scheduling upon review by the RN Navigator.

## 2020-04-07 NOTE — Telephone Encounter (Signed)
   Pt's daughter called, pt is still in rehab, r/s TOC on 04/16/2020 with Coletta Memos at 2:15 pm

## 2020-04-08 ENCOUNTER — Other Ambulatory Visit: Payer: Self-pay | Admitting: Nurse Practitioner

## 2020-04-08 DIAGNOSIS — I10 Essential (primary) hypertension: Secondary | ICD-10-CM

## 2020-04-09 ENCOUNTER — Ambulatory Visit: Payer: Medicare HMO | Admitting: General Practice

## 2020-04-09 NOTE — Telephone Encounter (Signed)
The original prescription was discontinued on 04/01/2020 by Kathlen Mody, Cadence H, PA-C for the following reason: Stop Taking at Discharge. Renewing this prescription may not be appropriate.  The above message is regarding Metoprolol, I tried to call patients daughter to discuss and her voicemail was full. RX denied with the indication patient needs to call the office. I need to confirm if she is still taking metoprolol.  Amlodipine was denied for it was no longer on the medication list

## 2020-04-15 NOTE — Progress Notes (Deleted)
Cardiology Clinic Note   Patient Name: Deanna [name] Date of Encounter: 04/15/2020  Primary Care Provider:  Lauree Chandler, NP Primary Cardiologist:  Quay Burow, MD  Patient Profile    Deanna [name] 79 year old female presents the clinic today for a follow-up evaluation of her essential hypertension, coronary artery disease status post CABG, critical lower limb ischemia, PAD, GERD, hypothyroidism, and hyperlipidemia.  Past Medical History    Past Medical History:  Diagnosis Date  . AMI 11/17/2008   Qualifier: Diagnosis of  By: Marland Mcalpine    . Anxiety   . Atherosclerotic peripheral vascular disease ([place])   . Bacteremia, escherichia coli 04/27/2015  . Brachial-basilar insufficiency syndrome   . CAD (coronary artery disease)    followed by dr [name].  . Celiac artery stenosis ([place])   . CHF (congestive heart failure) ([place]) 11/14/2017  . Chronic bilateral low back pain without sciatica   . Cognitive impairment   . Colon polyps   . Community acquired pneumonia   . Depression   . Diverticulosis   . Dysuria   . Encephalomalacia   . GAD (generalized anxiety disorder)   . GERD (gastroesophageal reflux disease)   . Glaucoma   . Hearing loss   . Hemorrhoids   . Hypertension   . Hypothyroidism   . Incontinence   . Mixed hyperlipidemia   . Myocardial infarction ([place])   . OSA on CPAP   . Peripheral arterial disease ([place])   . Pneumonitis 04/26/2015  . Pyelonephritis   . Pyelonephritis due to Escherichia coli 04/28/2015  . Sepsis ([place])   . Sinus drainage    took z-pack   finished yesterday  . Spondylosis of lumbar region without myelopathy or radiculopathy   . 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[place])   . Urticaria   . Vertigo, benign positional    Past Surgical History:  Procedure Laterality Date  . ABDOMINAL AORTOGRAM W/LOWER EXTREMITY N/A 08/22/2017   Procedure: ABDOMINAL AORTOGRAM W/LOWER EXTREMITY;  Surgeon: Serafina Mitchell, MD;  Location: [place] [facility];  Service: Cardiovascular;  Laterality: N/A;  . BILATERAL UPPER EXTREMITY ANGIOGRAM N/A 09/11/2012   Procedure: BILATERAL UPPER EXTREMITY ANGIOGRAM;  Surgeon: Serafina Mitchell, MD;  Location: [facility] CATH LAB;  Service: Cardiovascular;  Laterality: N/A;  . BIOPSY  03/25/2020   Procedure: BIOPSY;  Surgeon: Irving Copas., MD;  Location: [place];  Service: Gastroenterology;;  . carotid duplex doppler Bilateral 09/03/2012, 11/03/2011   Evidence of 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 patent left common carotid- subclavian artery bypass with turbulent flow noted at the anastomosis with velocities of 295 cm/s  . CAROTID-SUBCLAVIAN BYPASS GRAFT  12/15/2011   Procedure: BYPASS GRAFT CAROTID-SUBCLAVIAN;  Surgeon: Serafina Mitchell, MD;  Location: [facility] OR;  Service: Vascular;  Laterality: Left;  Left Carotid subclavian bypass  . CORONARY ANGIOPLASTY    . CORONARY ARTERY BYPASS GRAFT    . CORONARY ATHERECTOMY N/A 03/19/2020   Procedure: CORONARY ATHERECTOMY;  Surgeon: Jettie Booze, MD;  Location: [place] [facility];  Service: Cardiovascular;  Laterality: N/A;  . CORONARY STENT INTERVENTION N/A 03/19/2020   Procedure: CORONARY STENT INTERVENTION;  Surgeon: Jettie Booze, MD;  Location: [place] [facility];  Service: Cardiovascular;  Laterality: N/A;  . DOPPLER ECHOCARDIOGRAPHY  05/27/2010, 09/17/2008   Mild Proximal septal thickening is noted. Left ventricular systolic functions is normal ejection fraction =>55%.  the aortic valve appears to be mildly sclerotic   . ESOPHAGOGASTRODUODENOSCOPY (EGD) WITH PROPOFOL N/A 03/25/2020   Procedure: ESOPHAGOGASTRODUODENOSCOPY (EGD) WITH PROPOFOL;  Surgeon: Rush Landmark Telford Nab., MD;  Location: [place];  Service: Gastroenterology;  Laterality: N/A;  . fem-fem bypass graft  1999  . holter monitor  01/21/2008    The predominant rhythm was normal sinus rhythm. Minimum heartrate of 63 bpm at +01:00, maximum heartrate of 105 bpm at + 10:35; and the average heartrate of 75 bpm. Ventricular ectopic activity totaled 1270: Multifocal; 866-PVC's and 404-VEs             . JOINT REPLACEMENT     Left knee  . LEFT HEART CATH AND CORS/GRAFTS ANGIOGRAPHY N/A 03/19/2020   Procedure: LEFT HEART CATH AND CORS/GRAFTS ANGIOGRAPHY;  Surgeon: Jettie Booze, MD;  Location: [place] [facility];  Service: Cardiovascular;  Laterality: N/A;  . LEFT HEART CATHETERIZATION WITH CORONARY/GRAFT ANGIOGRAM N/A 12/21/2011   Procedure: LEFT HEART CATHETERIZATION WITH Beatrix Fetters;  Surgeon: Leonie Man, MD;  Location: [facility] CATH LAB;  Service: Cardiovascular;  Laterality: N/A;  . NM MYOCAR PERF EJECTION FRACTION  09/22/2009, 07/03/2007   the post stress myocardial perfusion images show a normal pattern of perfusion is all regions. The post-stress ejection fraction is 68 %. no significant wall motion abnormalities noted. This is a low risk scan.  Marland Kitchen PERIPHERAL VASCULAR INTERVENTION  08/22/2017   Procedure: PERIPHERAL VASCULAR INTERVENTION;  Surgeon: Serafina Mitchell, MD;  Location: [place] [facility];  Service: Cardiovascular;;  Fem/Fem Graft  . REPLACEMENT TOTAL KNEE  05-2011  . UNILATERAL UPPER EXTREMEITY ANGIOGRAM Left 11/15/2011   Procedure: UNILATERAL UPPER EXTREMEITY ANGIOGRAM;  Surgeon: Lorretta Harp, MD;  Location: [facility] CATH LAB;  Service: Cardiovascular;  Laterality: Left;    Allergies  Allergies  Allergen Reactions  . Donepezil Other (See Comments)    Altered mood, aggression, and caused anger  . Duloxetine Hcl     Increased confusion and memory concerns   . Meloxicam     Pt suspects that this medicine causes fluid on her lungs.   . Memantine Other (See Comments)    Caused severe aggression  . 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 Vicodin  [Hydrocodone-Acetaminophen] Other (See Comments)    Delirium, confusion, and toxic dementia    History of Present Illness    Ms. Sabo has a PMH of essential hypertension, coronary artery disease status post CABG, critical lower limb ischemia, PAD, GERD, hypothyroidism, obesity, anxiety, depression, thrombocytopenia, chronic pain, and hyperlipidemia.  She had an MI with PTCA to her left anterior descending in 1994, PTCA to LCx in 1995 and underwent CABG in 1999.  Due to her PAD she had left to right femorofemoral crossover and has also had left subclavian artery stenosis and left common carotid to subclavian artery bypass.  She presented to [facility] on 03/18/2020 and was discharged on 04/01/2020.  She presented to the hospital with chest pain and was found to have elevated troponins.  Her last cardiac catheterization 2013 via her left common femoral artery below femorofemoral bypass anastomosis showed LIMA-LAD with occluded LT ICA, SVG to diagonal patent, SVG to OM patent, SVG to RCA 100% thrombotic.  There was a large radiopaque filling defect just prior to her occlusion site.  This was not safe PCI for fear of distal embolization.  A widely patent left common and external iliac with brisk flow  L-R femorofemoral bypass graft was noted.  She also indicated that she was having shortness of breath and back pain.  Her chest pain was described as burning pain  Her back  pain had increased.  EKG showed sinus tach at 103, flattened T waves, inferior lateral and on later EKGs increase in ST depression.  She underwent cardiac catheterization on 03/19/2020 which showed ostial LAD-proximal LAD lesion 99% LIMA-LAD known to be atrertic.  She underwent orbital arthrectomy and received DES x1.  She also has mid LAD 100% stenosis however, arthrectomy and angioplasty could not restore flow.  Mid circumflex 90% stenosed SVG-OM patent.  Proximal RCA lesion 100% stenosed SVG-PDA occluded LVEF 25-35%.  Her  echocardiogram 03/19/2020 showed an LVEF of 35-40% G2 DD, mildly reduced right ventricular function and mild mitral valve regurgitation.  She presents to the clinic today for follow-up evaluation and states***  *** denies chest pain, shortness of breath, lower extremity edema, fatigue, palpitations, melena, hematuria, hemoptysis, diaphoresis, weakness, presyncope, syncope, orthopnea, and PND.  Home Medications    Prior to Admission medications   Medication Sig Start Date End Date Taking? Authorizing Provider  acetaminophen (TYLENOL) 325 MG tablet Take 325 mg by mouth in the morning and at bedtime. 1 by mouth in the morning and 1 by mouth in the pm.    [provider]  ALPRAZolam Duanne Moron) 0.5 MG tablet Take 1 tablet (0.5 mg total) by mouth daily as needed for anxiety. 02/26/20   Thayer Headings, PMHNP  aspirin EC 81 MG tablet Take 81 mg by mouth at bedtime.     [provider]  brimonidine (ALPHAGAN) 0.2 % ophthalmic solution Place 1 drop into both eyes 2 (two) times daily.    [provider]  calcium carbonate (TUMS - DOSED IN MG ELEMENTAL CALCIUM) 500 MG chewable tablet Chew 1 tablet by mouth daily as needed for indigestion or heartburn.    [provider]  cholecalciferol (VITAMIN D) 400 units TABS tablet Take 400 Units by mouth daily.    [provider]  clopidogrel (PLAVIX) 75 MG tablet TAKE 1 TABLET (75 MG TOTAL) BY MOUTH DAILY. 11/25/19   Lorretta Harp, MD  dapagliflozin propanediol (FARXIGA) 10 MG TABS tablet Take 1 tablet (10 mg total) by mouth daily. 04/01/20   Riesa Pope, MD  divalproex (DEPAKOTE ER) 250 MG 24 hr tablet TAKE 1 TABLET(250 MG) BY MOUTH TWICE DAILY 02/26/20   Thayer Headings, PMHNP  dorzolamide (TRUSOPT) 2 % ophthalmic solution Place 1 drop into both eyes 2 (two) times daily.    [provider]  escitalopram (LEXAPRO) 20 MG tablet TAKE 1 TABLET(20 MG) BY MOUTH DAILY Patient taking differently: Take 20 mg by  mouth daily.  02/26/20   Thayer Headings, PMHNP  furosemide (LASIX) 40 MG tablet Take 1 tablet (40 mg total) by mouth daily. Please continue daily dosing until your follow up cardiology appt. 04/01/20 04/01/21  Buford Dresser, MD  gabapentin (NEURONTIN) 100 MG capsule Take 1 capsule (100 mg total) by mouth 2 (two) times daily as needed. Patient taking differently: Take 200 mg by mouth 2 (two) times daily as needed (For nerve pain).  08/16/19   Lauree Chandler, NP  latanoprost (XALATAN) 0.005 % ophthalmic solution Place 1 drop into both eyes at bedtime.    [provider]  levothyroxine (SYNTHROID) 100 MCG tablet Take 1 tablet (100 mcg total) by mouth daily before breakfast. To take with 88 mcg tablet to equal 188 mcg total 12/13/19  Lauree Chandler, NP  levothyroxine (SYNTHROID) 88 MCG tablet Take 1 tablet (88 mcg total) by mouth daily before breakfast. To take with 100 mcg tablet to equal 188 mcg total 12/13/19   Lauree Chandler, NP  meclizine (ANTIVERT) 25 MG tablet Take 1 tablet (25 mg total) by mouth as needed for dizziness. 11/30/18   Lauree Chandler, NP  metoprolol succinate (TOPROL-XL) 25 MG 24 hr tablet Take 1 tablet (25 mg total) by mouth daily. 04/01/20   Furth, Cadence H, PA-C  mirabegron ER (MYRBETRIQ) 50 MG TB24 tablet Take 50 mg by mouth daily.    [provider]  nitroGLYCERIN (NITROSTAT) 0.4 MG SL tablet Place 1 tablet (0.4 mg total) under the tongue every 5 (five) minutes x 3 doses as needed for chest pain. 03/31/20   Katsadouros, Vasilios, MD  pantoprazole (PROTONIX) 40 MG tablet TAKE 1 TABLET EVERY DAY Patient taking differently: Take 40 mg by mouth daily.  11/25/19   Lauree Chandler, NP  rosuvastatin (CRESTOR) 40 MG tablet Take 1 tablet (40 mg total) by mouth daily. 08/23/19   Lauree Chandler, NP  tiZANidine (ZANAFLEX) 2 MG tablet Take 1 tablet (2 mg total) by mouth every 6 (six) hours as needed for muscle spasms. 07/01/19   Ngetich, Dinah C, NP    valsartan (DIOVAN) 40 MG tablet Take 1 tablet (40 mg total) by mouth every morning. 04/01/20   Buford Dresser, MD  vitamin B-12 (CYANOCOBALAMIN) 1000 MCG tablet Take 1,000 mcg by mouth daily.    [provider]    Family History    Family History  Problem Relation Age of Onset  . Heart attack Mother   . Heart disease Mother        before age 37  . Diabetes Father   . Heart disease Father   . Hypertension Father   . Hyperlipidemia Father   . Heart attack Father 30  . Heart attack Brother 85  . Cerebral palsy Sister 14  . Congestive Heart Failure Sister 70  . Heart attack Sister 72  . Hypertension Sister 28  . Dementia Sister   . Anxiety disorder Sister   . Anxiety disorder Sister 39  . Heart Problems Sister   . Stroke Sister   . Heart Problems Sister 41  . Heart attack Sister 69  . Colon cancer Brother 42  . Prostate cancer Brother 37  . Hypertension Daughter   . Irritable bowel syndrome Daughter   . Depression Daughter   . Anxiety disorder Daughter    She indicated that her mother is deceased. She indicated that her father is deceased. She indicated that two of her seven sisters are alive. She indicated that only one of her three brothers is alive. She indicated that her daughter is alive.  Social History    Social History   Socioeconomic History  . Marital status: Divorced    Spouse name: Not on file  . Number of children: Not on file  . Years of education: Not on file  . Highest education level: Not on file  Occupational History  . Not on file  Tobacco Use  . Smoking status: Former Smoker    Years: 3.00    Types: Cigarettes    Quit date: 11/27/1981    Years since quitting: 38.4  . Smokeless tobacco: Never Used  Vaping Use  . Vaping Use: Never used  Substance and Sexual Activity  . Alcohol use: Not Currently    Alcohol/week: 0.0 standard  drinks  . Drug use: No  . Sexual activity: Not Currently    Comment: 1st intercourse 24 yo-1 partner   Other Topics Concern  . Not on file  Social History Narrative   Social History      Diet? Healthy but too many sweets      Do you drink/eat things with caffeine? Yes occasionally      Marital status?                    D                What year were you married?      Do you live in a house, apartment, assisted living, condo, trailer, etc.? condo      Is it one or more stories? 1      How many persons live in your home? 2      Do you have any pets in your home? (please list) 1 cat      Highest level of education completed? 1 year college      Current or past profession: housewife      Do you exercise?           no                           Type & how often?      Advanced Directives      Do you have a living will? no      Do you have a DNR form?             no                     If not, do you want to discuss one? yes      Do you have signed POA/HPOA for forms? no      Functional Status      Do you have difficulty bathing or dressing yourself? No- gets tired      Do you have difficulty preparing food or eating? No- eats frozen entrees or poor nutrition meals      Do you have difficulty managing your medications? yes      Do you have difficulty managing your finances? yes      Do you have difficulty affording your medications? No- funds running low   Social Determinants of Health   Financial Resource Strain:   . Difficulty of Paying Living Expenses: Not on file  Food Insecurity:   . Worried About Charity fundraiser in the Last Year: Not on file  . Ran Out of Food in the Last Year: Not on file  Transportation Needs:   . Lack of Transportation (Medical): Not on file  . Lack of Transportation (Non-Medical): Not on file  Physical Activity:   . Days of Exercise per Week: Not on file  . Minutes of Exercise per Session: Not on file  Stress:   . Feeling of Stress : Not on file  Social Connections:   . Frequency of Communication with Friends and Family: Not on  file  . Frequency of Social Gatherings with Friends and Family: Not on file  . Attends Religious Services: Not on file  . Active Member of Clubs or Organizations: Not on file  . Attends Archivist Meetings: Not on file  . Marital Status: Not on file  Intimate Partner Violence:   . Fear of  Current or Ex-Partner: Not on file  . Emotionally Abused: Not on file  . Physically Abused: Not on file  . Sexually Abused: Not on file     Review of Systems    General:  No chills, fever, night sweats or weight changes.  Cardiovascular:  No chest pain, dyspnea on exertion, edema, orthopnea, palpitations, paroxysmal nocturnal dyspnea. Dermatological: No rash, lesions/masses Respiratory: No cough, dyspnea Urologic: No hematuria, dysuria Abdominal:   No nausea, vomiting, diarrhea, bright red blood per rectum, melena, or hematemesis Neurologic:  No visual changes, wkns, changes in mental status. All other systems reviewed and are otherwise negative except as noted above.  Physical Exam    VS:  There were no vitals taken for this visit. , BMI There is no height or weight on file to calculate BMI. GEN: Well nourished, well developed, in no acute distress. HEENT: normal. Neck: Supple, no JVD, carotid bruits, or masses. Cardiac: RRR, no murmurs, rubs, or gallops. No clubbing, cyanosis, edema.  Radials/DP/PT 2+ and equal bilaterally.  Respiratory:  Respirations regular and unlabored, clear to auscultation bilaterally. GI: Soft, nontender, nondistended, BS + x 4. MS: no deformity or atrophy. Skin: warm and dry, no rash. Neuro:  Strength and sensation are intact. Psych: Normal affect.  Accessory Clinical Findings    Recent Labs: 03/18/2020: TSH 0.394 03/23/2020: ALT 8 03/25/2020: B Natriuretic Peptide 1,834.3 03/30/2020: Magnesium 2.5 04/01/2020: BUN 29; Creatinine, Ser 1.46; Hemoglobin 10.3; Platelets 231; Potassium 4.4; Sodium 135   Recent Lipid Panel    Component Value Date/Time    CHOL 90 03/19/2020 0343   TRIG 172 (H) 03/19/2020 0343   HDL 26 (L) 03/19/2020 0343   CHOLHDL 3.5 03/19/2020 0343   VLDL 34 03/19/2020 0343   LDLCALC 30 03/19/2020 0343   LDLCALC 74 12/11/2019 1055    ECG personally reviewed by me today- *** - No acute changes  Echocardiogram 03/19/2020  IMPRESSIONS    1. Left ventricular ejection fraction, by estimation, is 35 to 40%. The  left ventricle has moderately decreased function. The left ventricle  demonstrates regional wall motion abnormalities (see scoring  diagram/findings for description). Left ventricular  diastolic parameters are consistent with Grade II diastolic dysfunction  (pseudonormalization). There is severe akinesis of the left ventricular,  mid-apical anteroseptal wall.  2. Right ventricular systolic function is mildly reduced. The right  ventricular size is normal.  3. The mitral valve is grossly normal. Mild mitral valve regurgitation.  4. The aortic valve is normal in structure. Aortic valve regurgitation is  not visualized. No aortic stenosis is present.   Cardiac catheterization 03/19/2020  Ost LAD to Prox LAD lesion is 99% stenosed. LIMA to LAD known to be atretic.  After orbital atherectomy, A drug-eluting stent was successfully placed using a STENT RESOLUTE ONYX 3.5X15.  Post intervention, there is a 0% residual stenosis.  Mid LAD lesion is 100% stenosed. Despite atherectomy and angioplasty, flow could not be restored.  Mid Cx lesion is 100% stenosed. SVG to OM patent.  Prox RCA lesion is 100% stenosed. SVG to PDA occluded.  There is moderate left ventricular systolic dysfunction.  LV end diastolic pressure is moderately elevated.  The left ventricular ejection fraction is 25-35% by visual estimate.  There is no aortic valve stenosis.  Balloon angioplasty was performed using a BALLOON SAPPHIRE 2.0X12.   Complex intervention due to severe three-vessel coronary artery disease with prior bypass  surgery.  Successful atherectomy and stenting of the proximal LAD.  Unable to restore flow through  the mid LAD.  I suspect, she is a somewhat late presenting infarct.  She already has evidence of heart failure with elevated LVEDP.  Will need ongoing medical therapy to help her LV dysfunction.   Diagnostic Dominance: Right  Intervention     Assessment & Plan   1.  NSTEMI-no chest pain today.  Has history of CABG.  She presented to the emergency department with chest pain and elevated troponins.  EKG did not show ischemic changes.  She underwent cardiac catheterization 03/19/2020 and received arthrectomy and DES x1 to her proximal LAD.  Arthrectomy was also attempted in her mid LAD however flow was not able to be restored.  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  No increased work of breathing today or activity intolerance.  Echocardiogram showed an LVEF of 35-40%, G2 DD. Continue furosemide, metoprolol, valsartan Heart healthy low-sodium diet-salty 6 given Increase physical activity as tolerated Daily weights  Paroxysmal atrial fibrillation-EKG today shows***.  Brief episode of atrial fibrillation noted following cardiac catheterization.  She converted to normal sinus rhythm amiodarone.  Her amiodarone was subsequently stopped and she did not have any recurrent atrial fibrillation.  Anticoagulation was not initiated. Continue metoprolol Heart healthy low-sodium diet-salty 6 given Increase physical activity as tolerated Continue to monitor  Hyperlipidemia-03/19/2020: Cholesterol 90; HDL 26; LDL Cholesterol 30; Triglycerides 172; VLDL 34 Continue rosuvastatin Heart healthy low-sodium diet-salty 6 given Increase physical activity as tolerated  Disposition: Follow-up with Dr. Harrell Gave  in 3 months.   Jossie Ng. Simcha Farrington NP-C    04/15/2020, 4:15 PM [facility] [address] Office [phone] Fax [phone]  Notice: This dictation was prepared with Dragon dictation along with smaller phrase technology. Any transcriptional errors that result from this process are unintentional and may not be corrected upon review.

## 2020-04-16 ENCOUNTER — Ambulatory Visit: Payer: Medicare HMO | Admitting: General Practice

## 2020-04-19 ENCOUNTER — Other Ambulatory Visit: Payer: Self-pay | Admitting: Nurse Practitioner

## 2020-04-19 DIAGNOSIS — K219 Gastro-esophageal reflux disease without esophagitis: Secondary | ICD-10-CM

## 2020-04-20 DIAGNOSIS — J3489 Other specified disorders of nose and nasal sinuses: Secondary | ICD-10-CM | POA: Diagnosis not present

## 2020-04-21 NOTE — Progress Notes (Signed)
Cardiology Clinic Note   Patient Name: Deanna [name] Date of Encounter: 04/22/2020  Primary Care Provider:  Lauree Chandler, NP Primary Cardiologist:  Quay Burow, MD  Patient Profile    Deanna [name]. [place] 79 year old female presents the clinic today for a follow-up evaluation of her essential hypertension, coronary artery disease status post CABG, critical lower limb ischemia, PAD, GERD, hypothyroidism, and hyperlipidemia.  Past Medical History    Past Medical History:  Diagnosis Date   AMI 11/17/2008   Qualifier: Diagnosis of  By: Marland Mcalpine     Anxiety    Atherosclerotic peripheral vascular disease ([place])    Bacteremia, escherichia coli 04/27/2015   Brachial-basilar insufficiency syndrome    CAD (coronary artery disease)    followed by dr [name].   Celiac artery stenosis (HCC)    CHF (congestive heart failure) ([employer]) 11/14/2017   Chronic bilateral low back pain without sciatica    Cognitive impairment    Colon polyps    Community acquired pneumonia    Depression    Diverticulosis    Dysuria    Encephalomalacia    GAD (generalized anxiety disorder)    GERD (gastroesophageal reflux disease)    Glaucoma    Hearing loss    Hemorrhoids    Hypertension    Hypothyroidism    Incontinence    Mixed hyperlipidemia    Myocardial infarction (HCC)    OSA on CPAP    Peripheral arterial disease ([place])    Pneumonitis 04/26/2015   Pyelonephritis    Pyelonephritis due to Escherichia coli 04/28/2015   Sepsis ([place])    Sinus drainage    took z-pack   finished yesterday   Spondylosis of lumbar region without myelopathy or radiculopathy    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[place])    Urticaria    Vertigo, benign positional    Past Surgical History:  Procedure Laterality Date   ABDOMINAL AORTOGRAM W/LOWER EXTREMITY N/A 08/22/2017   Procedure: ABDOMINAL AORTOGRAM W/LOWER EXTREMITY;  Surgeon: Serafina Mitchell, MD;  Location: [place] [facility];  Service: Cardiovascular;  Laterality: N/A;   BILATERAL UPPER EXTREMITY ANGIOGRAM N/A 09/11/2012   Procedure: BILATERAL UPPER EXTREMITY ANGIOGRAM;  Surgeon: Serafina Mitchell, MD;  Location: [facility] CATH LAB;  Service: Cardiovascular;  Laterality: N/A;   BIOPSY  03/25/2020   Procedure: BIOPSY;  Surgeon: Irving Copas., MD;  Location: [place];  Service: Gastroenterology;;   carotid duplex doppler Bilateral 09/03/2012, 11/03/2011   Evidence of 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 patent left common carotid- subclavian artery bypass with turbulent flow noted at the anastomosis with velocities of 295 cm/s   CAROTID-SUBCLAVIAN BYPASS GRAFT  12/15/2011   Procedure: BYPASS GRAFT CAROTID-SUBCLAVIAN;  Surgeon: Serafina Mitchell, MD;  Location: [facility] OR;  Service: Vascular;  Laterality: Left;  Left Carotid subclavian bypass   CORONARY ANGIOPLASTY     CORONARY ARTERY BYPASS GRAFT     CORONARY ATHERECTOMY N/A 03/19/2020   Procedure: CORONARY ATHERECTOMY;  Surgeon: Jettie Booze, MD;  Location: [place] [facility];  Service: Cardiovascular;  Laterality: N/A;   CORONARY STENT INTERVENTION N/A 03/19/2020   Procedure: CORONARY STENT INTERVENTION;  Surgeon: Jettie Booze, MD;  Location: [place] [facility];  Service: Cardiovascular;  Laterality: N/A;   DOPPLER ECHOCARDIOGRAPHY  05/27/2010, 09/17/2008   Mild Proximal septal thickening is noted. Left ventricular systolic functions is normal ejection fraction =>55%.  the aortic valve appears to be mildly sclerotic    ESOPHAGOGASTRODUODENOSCOPY (EGD) WITH PROPOFOL N/A 03/25/2020   Procedure: ESOPHAGOGASTRODUODENOSCOPY (EGD) WITH PROPOFOL;  Surgeon: Rush Landmark Telford Nab., MD;  Location: [place];  Service: Gastroenterology;  Laterality: N/A;   fem-fem bypass graft  1999   holter monitor  01/21/2008    The predominant rhythm was normal sinus rhythm. Minimum heartrate of 63 bpm at +01:00, maximum heartrate of 105 bpm at + 10:35; and the average heartrate of 75 bpm. Ventricular ectopic activity totaled 1270: Multifocal; 866-PVC's and 404-VEs              JOINT REPLACEMENT     Left knee   LEFT HEART CATH AND CORS/GRAFTS ANGIOGRAPHY N/A 03/19/2020   Procedure: LEFT HEART CATH AND CORS/GRAFTS ANGIOGRAPHY;  Surgeon: Jettie Booze, MD;  Location: [place] [facility];  Service: Cardiovascular;  Laterality: N/A;   LEFT HEART CATHETERIZATION WITH CORONARY/GRAFT ANGIOGRAM N/A 12/21/2011   Procedure: LEFT HEART CATHETERIZATION WITH Beatrix Fetters;  Surgeon: Leonie Man, MD;  Location: [facility] CATH LAB;  Service: Cardiovascular;  Laterality: N/A;   NM MYOCAR PERF EJECTION FRACTION  09/22/2009, 07/03/2007   the post stress myocardial perfusion images show a normal pattern of perfusion is all regions. The post-stress ejection fraction is 68 %. no significant wall motion abnormalities noted. This is a low risk scan.   PERIPHERAL VASCULAR INTERVENTION  08/22/2017   Procedure: PERIPHERAL VASCULAR INTERVENTION;  Surgeon: Serafina Mitchell, MD;  Location: [place] [facility];  Service: Cardiovascular;;  Fem/Fem Graft   REPLACEMENT TOTAL KNEE  05-2011   UNILATERAL UPPER EXTREMEITY ANGIOGRAM Left 11/15/2011   Procedure: UNILATERAL UPPER Anselmo Rod;  Surgeon: Lorretta Harp, MD;  Location: [facility] CATH LAB;  Service: Cardiovascular;  Laterality: Left;    Allergies  Allergies  Allergen Reactions   Donepezil Other (See Comments)    Altered mood, aggression, and caused anger   Duloxetine Hcl     Increased confusion and memory concerns    Meloxicam     Pt suspects that this medicine causes fluid on her lungs.    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 Kram has a PMH of essential hypertension, coronary artery disease status post CABG, critical lower limb ischemia, PAD, GERD, hypothyroidism, obesity, anxiety, depression, thrombocytopenia, chronic pain, and hyperlipidemia.  She had an MI with PTCA to her left anterior descending in 1994, PTCA to LCx in 1995 and underwent CABG in 1999.  Due to her PAD she had left to right femorofemoral crossover and has also had left subclavian artery stenosis and left common carotid to subclavian artery bypass.  She presented to [facility] on 03/18/2020 and was discharged on 04/01/2020.  She presented to the hospital with chest pain and was found to have elevated troponins.  Her last cardiac catheterization 2013 via her left common femoral artery below femorofemoral bypass anastomosis showed LIMA-LAD with occluded LT ICA, SVG to diagonal patent, SVG to OM patent, SVG to RCA 100% thrombotic.  There was a large radiopaque filling defect just prior to her occlusion site.  This was not safe PCI for fear of distal embolization.  A widely patent left common and external iliac with brisk flow  L-R femorofemoral bypass graft was noted.  She also indicated that she was having shortness of breath and back pain.  Her chest pain was described as burning pain  Her back  pain had increased.  EKG showed sinus tach at 103, flattened T waves, inferior lateral and on later EKGs increase in ST depression.  She underwent cardiac catheterization on 03/19/2020 which showed ostial LAD-proximal LAD lesion 99% LIMA-LAD known to be atrertic.  She underwent orbital arthrectomy and received DES x1.  She also has mid LAD 100% stenosis however, arthrectomy and angioplasty could not restore flow.  Mid circumflex 90% stenosed SVG-OM patent.  Proximal RCA lesion 100% stenosed SVG-PDA occluded LVEF 25-35%.  Her  echocardiogram 03/19/2020 showed an LVEF of 35-40% G2 DD, mildly reduced right ventricular function and mild mitral valve regurgitation.  She presents to the clinic today for follow-up evaluation with her daughter and states she is feeling much better.  She continues to be on oxygen 1.5 L nasal cannula.  Her endurance has improved as well as her strength.  She continues rehab at [facility].  They will evaluate this week whether she concerns that she had back to living with her daughter.  Her daughter indicated that they are having a shower remodeled so that her mom has easier access.  She is performing physical therapy 1 times daily with walking, marching in place, and strength type exercises.  She denies chest or back pain.  I will order a BMP and CBC, give salty 6 diet sheet, and have her follow-up with Dr. Gwenlyn Found in 3 months.  Today she denies chest pain, shortness of breath, lower extremity edema, fatigue, palpitations, melena, hematuria, hemoptysis, diaphoresis, weakness, presyncope, syncope, orthopnea, and PND.   Home Medications    Prior to Admission medications   Medication Sig Start Date End Date Taking? Authorizing Provider  acetaminophen (TYLENOL) 325 MG tablet Take 325 mg by mouth in the morning and at bedtime. 1 by mouth in the morning and 1 by mouth in the pm.    [provider]  ALPRAZolam Duanne Moron) 0.5 MG tablet Take 1 tablet (0.5 mg total) by mouth daily as needed for anxiety. 02/26/20   Thayer Headings, PMHNP  aspirin EC 81 MG tablet Take 81 mg by mouth at bedtime.     [provider]  brimonidine (ALPHAGAN) 0.2 % ophthalmic solution Place 1 drop into both eyes 2 (two) times daily.    [provider]  calcium carbonate (TUMS - DOSED IN MG ELEMENTAL CALCIUM) 500 MG chewable tablet Chew 1 tablet by mouth daily as needed for indigestion or heartburn.    [provider]  cholecalciferol (VITAMIN D) 400 units TABS tablet Take 400 Units by mouth daily.    [provider]  clopidogrel (PLAVIX) 75 MG tablet TAKE 1 TABLET (75 MG TOTAL) BY MOUTH DAILY. 11/25/19   Lorretta Harp, MD  dapagliflozin propanediol (FARXIGA) 10 MG TABS tablet Take 1 tablet (10 mg total) by mouth daily. 04/01/20   Riesa Pope, MD  divalproex (DEPAKOTE ER) 250 MG 24 hr tablet TAKE 1 TABLET(250 MG) BY MOUTH TWICE DAILY 02/26/20   Thayer Headings, PMHNP  dorzolamide (TRUSOPT) 2 % ophthalmic solution Place 1 drop into both eyes 2 (two) times daily.    [provider]  escitalopram (LEXAPRO) 20 MG tablet TAKE 1 TABLET(20 MG) BY MOUTH DAILY Patient taking differently: Take 20 mg by mouth daily.  02/26/20   Thayer Headings, PMHNP  furosemide (LASIX) 40 MG tablet Take  1 tablet (40 mg total) by mouth daily. Please continue daily dosing until your follow up cardiology appt. 04/01/20 04/01/21  Buford Dresser, MD  gabapentin (NEURONTIN) 100 MG capsule Take 1 capsule (100 mg total) by mouth 2 (two) times daily as needed. Patient taking differently: Take 200 mg by mouth 2 (two) times daily as needed (For nerve pain).  08/16/19   Lauree Chandler, NP  latanoprost (XALATAN) 0.005 % ophthalmic solution Place 1 drop into both eyes at bedtime.    [provider]  levothyroxine (SYNTHROID) 100 MCG tablet Take 1 tablet (100 mcg total) by mouth daily before breakfast. To take with 88 mcg tablet to equal 188 mcg total 12/13/19   Lauree Chandler, NP  levothyroxine (SYNTHROID) 88 MCG tablet Take 1 tablet (88 mcg total) by mouth daily before breakfast. To take with 100 mcg tablet to equal 188 mcg total 12/13/19   Lauree Chandler, NP  meclizine (ANTIVERT) 25 MG tablet Take 1 tablet (25 mg total) by mouth as needed for dizziness. 11/30/18   Lauree Chandler, NP  metoprolol succinate (TOPROL-XL) 25 MG 24 hr tablet Take 1 tablet (25 mg total) by mouth daily. 04/01/20   Furth, Cadence H, PA-C  mirabegron ER (MYRBETRIQ) 50 MG TB24 tablet Take 50 mg by mouth daily.    [provider]  nitroGLYCERIN (NITROSTAT) 0.4 MG SL tablet Place 1 tablet (0.4 mg total) under the tongue every 5 (five) minutes x 3 doses as needed for chest pain. 03/31/20   Riesa Pope, MD  pantoprazole (PROTONIX) 40 MG tablet TAKE 1 TABLET EVERY DAY 04/20/20   Lauree Chandler, NP  rosuvastatin (CRESTOR) 40 MG tablet Take 1 tablet (40 mg total) by mouth daily. 08/23/19   Lauree Chandler, NP  tiZANidine (ZANAFLEX) 2 MG tablet Take 1 tablet (2 mg total) by mouth every 6 (six) hours as needed for muscle spasms. 07/01/19   Ngetich, Dinah C, NP  valsartan (DIOVAN) 40 MG tablet Take 1 tablet (40 mg total) by mouth every morning. 04/01/20   Buford Dresser, MD  vitamin B-12 (CYANOCOBALAMIN) 1000 MCG tablet Take 1,000 mcg by mouth daily.    [provider]    Family History    Family History  Problem Relation Age of Onset   Heart attack Mother    Heart disease Mother        before age 60   Diabetes Father    Heart disease Father    Hypertension Father    Hyperlipidemia Father    Heart attack Father 41   Heart attack Brother 69   Cerebral palsy Sister 22   Congestive Heart Failure Sister 81   Heart attack Sister 12   Hypertension Sister 27   Dementia Sister    Anxiety disorder Sister    Anxiety disorder Sister 70   Heart Problems Sister    Stroke Sister    Heart Problems Sister 12   Heart attack Sister 41   Colon cancer Brother 65   Prostate cancer Brother 57   Hypertension Daughter    Irritable bowel syndrome Daughter    Depression Daughter    Anxiety disorder Daughter    She indicated that her mother is deceased. She indicated that her father is deceased. She indicated that two of her seven sisters are alive. She indicated that only one of her three brothers is alive. She indicated that her daughter is alive.  Social History    Social History   Socioeconomic  History   Marital status: Divorced    Spouse name: Not on file    Number of children: Not on file   Years of education: Not on file   Highest education level: Not on file  Occupational History   Not on file  Tobacco Use   Smoking status: Former Smoker    Years: 3.00    Types: Cigarettes    Quit date: 11/27/1981    Years since quitting: 38.4   Smokeless tobacco: Never Used  Vaping Use   Vaping Use: Never used  Substance and Sexual Activity   Alcohol use: Not Currently    Alcohol/week: 0.0 standard drinks   Drug use: No   Sexual activity: Not Currently    Comment: 1st intercourse 23 yo-1 partner  Other Topics Concern   Not on file  Social History Narrative   Social History      Diet? Healthy but too many sweets      Do you drink/eat things with caffeine? Yes occasionally      Marital status?                    D                What year were you married?      Do you live in a house, apartment, assisted living, condo, trailer, etc.? condo      Is it one or more stories? 1      How many persons live in your home? 2      Do you have any pets in your home? (please list) 1 cat      Highest level of education completed? 1 year college      Current or past profession: housewife      Do you exercise?           no                           Type & how often?      Advanced Directives      Do you have a living will? no      Do you have a DNR form?             no                     If not, do you want to discuss one? yes      Do you have signed POA/HPOA for forms? no      Functional Status      Do you have difficulty bathing or dressing yourself? No- gets tired      Do you have difficulty preparing food or eating? No- eats frozen entrees or poor nutrition meals      Do you have difficulty managing your medications? yes      Do you have difficulty managing your finances? yes      Do you have difficulty affording your medications? No- funds running low   Social Determinants of Health   Financial Resource Strain:     Difficulty of Paying Living Expenses: Not on file  Food Insecurity:    Worried About Charity fundraiser in the Last Year: Not on file   [employer] of Food in the Last Year: Not on file  Transportation Needs:    Lack of Transportation (Medical): Not on file   Lack of Transportation (Non-Medical): Not on file  Physical Activity:    Days of Exercise per Week: Not on file   Minutes of Exercise per Session: Not on file  Stress:    Feeling of Stress : Not on file  Social Connections:    Frequency of Communication with Friends and Family: Not on file   Frequency of Social Gatherings with Friends and Family: Not on file   Attends Religious Services: Not on file   Active Member of Clubs or Organizations: Not on file   Attends Archivist Meetings: Not on file   Marital Status: Not on file  Intimate Partner Violence:    Fear of Current or Ex-Partner: Not on file   Emotionally Abused: Not on file   Physically Abused: Not on file   Sexually Abused: Not on file     Review of Systems    General:  No chills, fever, night sweats or weight changes.  Cardiovascular:  No chest pain, dyspnea on exertion, edema, orthopnea, palpitations, paroxysmal nocturnal dyspnea. Dermatological: No rash, lesions/masses Respiratory: No cough, dyspnea Urologic: No hematuria, dysuria Abdominal:   No nausea, vomiting, diarrhea, bright red blood per rectum, melena, or hematemesis Neurologic:  No visual changes, wkns, changes in mental status. All other systems reviewed and are otherwise negative except as noted above.  Physical Exam    VS:  BP (!) 96/48    Pulse 71    Ht 5\' 2"  (7.989 m)    Wt 206 lb 6.4 oz (93.6 kg)    SpO2 98%    BMI 37.75 kg/m  , BMI Body mass index is 37.75 kg/m. GEN: Well nourished, well developed, in no acute distress. HEENT: normal. Neck: Supple, no JVD, carotid bruits, or masses. Cardiac: RRR, no murmurs, rubs, or gallops. No clubbing, cyanosis, edema.   Radials/DP/PT 2+ and equal bilaterally.  Respiratory:  Respirations regular and unlabored, clear to auscultation bilaterally. GI: Soft, nontender, nondistended, BS + x 4. MS: no deformity or atrophy. Skin: warm and dry, no rash. Neuro:  Strength and sensation are intact. Psych: Normal affect.  Accessory Clinical Findings    Recent Labs: 03/18/2020: TSH 0.394 03/23/2020: ALT 8 03/25/2020: B Natriuretic Peptide 1,834.3 03/30/2020: Magnesium 2.5 04/01/2020: BUN 29; Creatinine, Ser 1.46; Hemoglobin 10.3; Platelets 231; Potassium 4.4; Sodium 135   Recent Lipid Panel    Component Value Date/Time   CHOL 90 03/19/2020 0343   TRIG 172 (H) 03/19/2020 0343   HDL 26 (L) 03/19/2020 0343   CHOLHDL 3.5 03/19/2020 0343   VLDL 34 03/19/2020 0343   LDLCALC 30 03/19/2020 0343   LDLCALC 74 12/11/2019 1055    ECG personally reviewed by me today-normal sinus rhythm inferior infarct undetermined age, anterior infarct undetermined age, ST and T wave abnormality consider lateral ischemia 71 bpm- No acute changes  Echocardiogram 03/19/2020  IMPRESSIONS    1. Left ventricular ejection fraction, by estimation, is 35 to 40%. The  left ventricle has moderately decreased function. The left ventricle  demonstrates regional wall motion abnormalities (see scoring  diagram/findings for description). Left ventricular  diastolic parameters are consistent with Grade II diastolic dysfunction  (pseudonormalization). There is severe akinesis of the left ventricular,  mid-apical anteroseptal wall.  2. Right ventricular systolic function is mildly reduced. The right  ventricular size is normal.  3. The mitral valve is grossly normal. Mild mitral valve regurgitation.  4. The aortic valve is normal in structure. Aortic valve regurgitation is  not visualized. No aortic stenosis is present.   Cardiac catheterization 03/19/2020  Ost LAD  to Prox LAD lesion is 99% stenosed. LIMA to LAD known to be atretic.  After  orbital atherectomy, A drug-eluting stent was successfully placed using a STENT RESOLUTE ONYX 3.5X15.  Post intervention, there is a 0% residual stenosis.  Mid LAD lesion is 100% stenosed. Despite atherectomy and angioplasty, flow could not be restored.  Mid Cx lesion is 100% stenosed. SVG to OM patent.  Prox RCA lesion is 100% stenosed. SVG to PDA occluded.  There is moderate left ventricular systolic dysfunction.  LV end diastolic pressure is moderately elevated.  The left ventricular ejection fraction is 25-35% by visual estimate.  There is no aortic valve stenosis.  Balloon angioplasty was performed using a BALLOON SAPPHIRE 2.0X12.   Complex intervention due to severe three-vessel coronary artery disease with prior bypass surgery.  Successful atherectomy and stenting of the proximal LAD.  Unable to restore flow through the mid LAD.  I suspect, she is a somewhat late presenting infarct.  She already has evidence of heart failure with elevated LVEDP.  Will need ongoing medical therapy to help her LV dysfunction.   Diagnostic Dominance: Right  Intervention       Assessment & Plan   1.   NSTEMI-no chest pain today.  Has history of CABG.  She presented to the emergency department with chest pain and elevated troponins.  EKG did not show ischemic changes.  She underwent cardiac catheterization 03/19/2020 and received arthrectomy and DES x1 to her proximal LAD.  Arthrectomy was also attempted in her mid LAD however flow was not able to be restored.  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  No increased work of breathing today or activity intolerance.  Echocardiogram showed an LVEF of 35-40%, G2 DD. Continue furosemide, metoprolol, valsartan Heart healthy low-sodium diet-salty 6  given Increase physical activity as tolerated Daily weights  Paroxysmal atrial fibrillation-EKG today shows normal sinus rhythm inferior infarct undetermined age possible anterior infarct undetermined age ST and T wave abnormality consider lateral ischemia 71 bpm.  Brief episode of atrial fibrillation noted following cardiac catheterization.  She converted to normal sinus rhythm amiodarone.  Her amiodarone was subsequently stopped and she did not have any recurrent atrial fibrillation.  Anticoagulation was not initiated. Continue metoprolol Heart healthy low-sodium diet-salty 6 given Increase physical activity as tolerated Continue to monitor  Hyperlipidemia-03/19/2020: Cholesterol 90; HDL 26; LDL Cholesterol 30; Triglycerides 172; VLDL 34 Continue rosuvastatin Heart healthy low-sodium diet-salty 6 given Increase physical activity as tolerated  Disposition: Follow-up with Dr. Gwenlyn Found in 3 months   Jossie Ng. Zulay Corrie NP-C    04/22/2020, 10:49 AM [place] [place] [address] Office [phone] Fax [phone]  Notice: This dictation was prepared with Dragon dictation along with smaller phrase technology. Any transcriptional errors that result from this process are unintentional and may not be corrected upon review.

## 2020-04-22 ENCOUNTER — Other Ambulatory Visit: Payer: Self-pay

## 2020-04-22 ENCOUNTER — Ambulatory Visit: Payer: Medicare HMO | Admitting: General Practice

## 2020-04-22 ENCOUNTER — Encounter: Payer: Self-pay | Admitting: General Practice

## 2020-04-22 VITALS — BP 96/48 | HR 71 | Ht 62.0 in | Wt 206.4 lb

## 2020-04-22 DIAGNOSIS — Z79899 Other long term (current) drug therapy: Secondary | ICD-10-CM | POA: Diagnosis not present

## 2020-04-22 DIAGNOSIS — E782 Mixed hyperlipidemia: Secondary | ICD-10-CM

## 2020-04-22 DIAGNOSIS — I5043 Acute on chronic combined systolic (congestive) and diastolic (congestive) heart failure: Secondary | ICD-10-CM

## 2020-04-22 DIAGNOSIS — I480 Paroxysmal atrial fibrillation: Secondary | ICD-10-CM

## 2020-04-22 DIAGNOSIS — I214 Non-ST elevation (NSTEMI) myocardial infarction: Secondary | ICD-10-CM

## 2020-04-22 DIAGNOSIS — I255 Ischemic cardiomyopathy: Secondary | ICD-10-CM | POA: Diagnosis not present

## 2020-04-22 LAB — BASIC METABOLIC PANEL
BUN/Creatinine Ratio: 16 (ref 12–28)
BUN: 25 mg/dL (ref 8–27)
CO2: 30 mmol/L — ABNORMAL HIGH (ref 20–29)
Calcium: 9 mg/dL (ref 8.7–10.3)
Chloride: 98 mmol/L (ref 96–106)
Creatinine, Ser: 1.57 mg/dL — ABNORMAL HIGH (ref 0.57–1.00)
GFR calc Af Amer: 36 mL/min/{1.73_m2} — ABNORMAL LOW (ref >59)
GFR calc non Af Amer: 31 mL/min/{1.73_m2} — ABNORMAL LOW (ref >59)
Glucose: 99 mg/dL (ref 65–99)
Potassium: 4.1 mmol/L (ref 3.5–5.2)
Sodium: 144 mmol/L (ref 134–144)

## 2020-04-22 LAB — CBC
Hematocrit: 30.6 % — ABNORMAL LOW (ref 34.0–46.6)
Hemoglobin: 10.2 g/dL — ABNORMAL LOW (ref 11.1–15.9)
MCH: 28.7 pg (ref 26.6–33.0)
MCHC: 33.3 g/dL (ref 31.5–35.7)
MCV: 86 fL (ref 79–97)
Platelets: 108 10*3/uL — ABNORMAL LOW (ref 150–450)
RBC: 3.56 x10E6/uL — ABNORMAL LOW (ref 3.77–5.28)
RDW: 13.5 % (ref 11.7–15.4)
WBC: 5.7 10*3/uL (ref 3.4–10.8)

## 2020-04-22 NOTE — Patient Instructions (Signed)
Medication Instructions:  The current medical regimen is effective;  continue present plan and medications as directed. Please refer to the Current Medication list given to you today. *If you need a refill on your cardiac medications before your next appointment, please call your pharmacy*  Lab Work: BMET AND CBC TODAY If you have labs (blood work) drawn today and your tests are completely normal, you will receive your results only by:  [place] (if you have MyChart) OR A paper copy in the mail.  If you have any lab test that is abnormal or we need to change your treatment, we will call you to review the results. You may go to any [facility] that is convenient for you however, we do have a lab in our office that is able to assist you. You DO NOT need an appointment for our lab. The lab is open 8:00am and closes at 4:00pm. Lunch 12:45 - 1:45pm.  Testing/Procedures: NONE  Special Instructions PLEASE READ AND FOLLOW SALTY 6-ATTACHED  PLEASE CONTINUE PHYSICAL ACTIVITY AS TOLERATED  Follow-Up: Your next appointment:  3 month(s) In Person with Quay Burow, MD -[place], FNP-C  At [facility], you and your health needs are our priority.  As part of our continuing mission to provide you with exceptional heart care, we have created designated Provider Care Teams.  These Care Teams include your primary Cardiologist (physician) and Advanced Practice Providers (APPs -  Physician Assistants and Nurse Practitioners) who all work together to provide you with the care you need, when you need it.

## 2020-04-23 DIAGNOSIS — I214 Non-ST elevation (NSTEMI) myocardial infarction: Secondary | ICD-10-CM | POA: Diagnosis not present

## 2020-04-24 DIAGNOSIS — J9601 Acute respiratory failure with hypoxia: Secondary | ICD-10-CM | POA: Diagnosis not present

## 2020-04-24 DIAGNOSIS — I214 Non-ST elevation (NSTEMI) myocardial infarction: Secondary | ICD-10-CM | POA: Diagnosis not present

## 2020-04-24 DIAGNOSIS — I428 Other cardiomyopathies: Secondary | ICD-10-CM | POA: Diagnosis not present

## 2020-04-24 DIAGNOSIS — E038 Other specified hypothyroidism: Secondary | ICD-10-CM | POA: Diagnosis not present

## 2020-04-24 DIAGNOSIS — I7389 Other specified peripheral vascular diseases: Secondary | ICD-10-CM | POA: Diagnosis not present

## 2020-04-24 DIAGNOSIS — K2970 Gastritis, unspecified, without bleeding: Secondary | ICD-10-CM | POA: Diagnosis not present

## 2020-04-24 DIAGNOSIS — I5189 Other ill-defined heart diseases: Secondary | ICD-10-CM | POA: Diagnosis not present

## 2020-04-24 DIAGNOSIS — R4189 Other symptoms and signs involving cognitive functions and awareness: Secondary | ICD-10-CM | POA: Diagnosis not present

## 2020-04-24 DIAGNOSIS — D6489 Other specified anemias: Secondary | ICD-10-CM | POA: Diagnosis not present

## 2020-04-25 ENCOUNTER — Other Ambulatory Visit: Payer: Self-pay | Admitting: Nurse Practitioner

## 2020-04-25 DIAGNOSIS — E039 Hypothyroidism, unspecified: Secondary | ICD-10-CM

## 2020-04-27 ENCOUNTER — Telehealth: Payer: Self-pay

## 2020-04-27 NOTE — Telephone Encounter (Signed)
Pt needs follow up appt

## 2020-04-27 NOTE — Telephone Encounter (Signed)
Left detailed message on voicemail informing patients daughter Deanna [name] that her mother is overdue for her 3 month follow-up. Patient needs to call and schedule, appointment for August was canceled due to covid exposure.

## 2020-04-27 NOTE — Telephone Encounter (Signed)
Incoming call received from [facility] with [facility] indicating patient will start Speech Therapy on Wednesday 04/29/2020

## 2020-04-27 NOTE — Telephone Encounter (Signed)
Please refer to refill request from today, I called and left a detailed message requesting return call to schedule appointment for follow-up that was canceled last month

## 2020-04-29 DIAGNOSIS — I130 Hypertensive heart and chronic kidney disease with heart failure and stage 1 through stage 4 chronic kidney disease, or unspecified chronic kidney disease: Secondary | ICD-10-CM | POA: Diagnosis not present

## 2020-04-29 DIAGNOSIS — H9193 Unspecified hearing loss, bilateral: Secondary | ICD-10-CM

## 2020-04-29 DIAGNOSIS — D631 Anemia in chronic kidney disease: Secondary | ICD-10-CM

## 2020-04-29 DIAGNOSIS — I214 Non-ST elevation (NSTEMI) myocardial infarction: Secondary | ICD-10-CM | POA: Diagnosis not present

## 2020-04-29 DIAGNOSIS — N189 Chronic kidney disease, unspecified: Secondary | ICD-10-CM | POA: Diagnosis not present

## 2020-04-29 DIAGNOSIS — H409 Unspecified glaucoma: Secondary | ICD-10-CM

## 2020-04-29 DIAGNOSIS — I509 Heart failure, unspecified: Secondary | ICD-10-CM | POA: Diagnosis not present

## 2020-04-29 DIAGNOSIS — I739 Peripheral vascular disease, unspecified: Secondary | ICD-10-CM | POA: Diagnosis not present

## 2020-04-29 DIAGNOSIS — I2510 Atherosclerotic heart disease of native coronary artery without angina pectoris: Secondary | ICD-10-CM

## 2020-04-30 DIAGNOSIS — I739 Peripheral vascular disease, unspecified: Secondary | ICD-10-CM | POA: Diagnosis not present

## 2020-04-30 DIAGNOSIS — I130 Hypertensive heart and chronic kidney disease with heart failure and stage 1 through stage 4 chronic kidney disease, or unspecified chronic kidney disease: Secondary | ICD-10-CM | POA: Diagnosis not present

## 2020-04-30 DIAGNOSIS — I509 Heart failure, unspecified: Secondary | ICD-10-CM | POA: Diagnosis not present

## 2020-04-30 DIAGNOSIS — H409 Unspecified glaucoma: Secondary | ICD-10-CM | POA: Diagnosis not present

## 2020-04-30 DIAGNOSIS — I2510 Atherosclerotic heart disease of native coronary artery without angina pectoris: Secondary | ICD-10-CM | POA: Diagnosis not present

## 2020-04-30 DIAGNOSIS — N189 Chronic kidney disease, unspecified: Secondary | ICD-10-CM | POA: Diagnosis not present

## 2020-04-30 DIAGNOSIS — D631 Anemia in chronic kidney disease: Secondary | ICD-10-CM | POA: Diagnosis not present

## 2020-04-30 DIAGNOSIS — I214 Non-ST elevation (NSTEMI) myocardial infarction: Secondary | ICD-10-CM | POA: Diagnosis not present

## 2020-04-30 DIAGNOSIS — H9193 Unspecified hearing loss, bilateral: Secondary | ICD-10-CM | POA: Diagnosis not present

## 2020-04-30 NOTE — Telephone Encounter (Signed)
Diamond Nickel with [facility] called requesting verbal orders for Therapy.  Informed her that patient needed an appointment before verbal orders could be given.  She agreed and stated that she would let patient's daughter know to call the office to schedule an appointment.

## 2020-05-06 ENCOUNTER — Other Ambulatory Visit: Payer: Self-pay

## 2020-05-06 ENCOUNTER — Ambulatory Visit (INDEPENDENT_AMBULATORY_CARE_PROVIDER_SITE_OTHER): Payer: Medicare HMO | Admitting: Nurse Practitioner

## 2020-05-06 ENCOUNTER — Telehealth: Payer: Self-pay

## 2020-05-06 ENCOUNTER — Encounter: Payer: Self-pay | Admitting: Nurse Practitioner

## 2020-05-06 VITALS — BP 120/70 | HR 88 | Temp 97.9°F | Ht 62.0 in | Wt 205.2 lb

## 2020-05-06 DIAGNOSIS — R413 Other amnesia: Secondary | ICD-10-CM

## 2020-05-06 DIAGNOSIS — N3281 Overactive bladder: Secondary | ICD-10-CM | POA: Diagnosis not present

## 2020-05-06 DIAGNOSIS — E785 Hyperlipidemia, unspecified: Secondary | ICD-10-CM | POA: Diagnosis not present

## 2020-05-06 DIAGNOSIS — K219 Gastro-esophageal reflux disease without esophagitis: Secondary | ICD-10-CM

## 2020-05-06 DIAGNOSIS — I509 Heart failure, unspecified: Secondary | ICD-10-CM

## 2020-05-06 DIAGNOSIS — E039 Hypothyroidism, unspecified: Secondary | ICD-10-CM

## 2020-05-06 DIAGNOSIS — Z23 Encounter for immunization: Secondary | ICD-10-CM | POA: Diagnosis not present

## 2020-05-06 DIAGNOSIS — M5441 Lumbago with sciatica, right side: Secondary | ICD-10-CM | POA: Diagnosis not present

## 2020-05-06 DIAGNOSIS — R5381 Other malaise: Secondary | ICD-10-CM | POA: Diagnosis not present

## 2020-05-06 DIAGNOSIS — I10 Essential (primary) hypertension: Secondary | ICD-10-CM | POA: Diagnosis not present

## 2020-05-06 MED ORDER — FUROSEMIDE 40 MG PO TABS: 40.0000 mg | ORAL_TABLET | Freq: Every day | ORAL | 2 refills | Status: DC

## 2020-05-06 NOTE — Progress Notes (Signed)
Careteam: Patient Care Team: Lauree Chandler, NP as PCP - General (Geriatric Medicine) Lorretta Harp, MD as PCP - Cardiology (Cardiology) Irene Shipper, MD as Consulting Physician (Gastroenterology) Frederik Pear, MD as Consulting Physician (Orthopedic Surgery) Fontaine, Belinda Block, MD (Inactive) as Consulting Physician (Gynecology) Rutherford Guys, MD as Consulting Physician (Ophthalmology) Bjorn Loser, MD as Consulting Physician (Urology) Serafina Mitchell, MD as Consulting Physician (Vascular Surgery) Cameron Sprang, MD as Consulting Physician (Neurology) Desma Maxim, MD as Referring Physician (Ophthalmology) Thayer Headings, PMHNP as Nurse Practitioner (Psychiatry) Cameron Sprang, MD as Consulting Physician (Neurology)  PLACE OF SERVICE:  [place]  Advanced Directive information    Allergies  Allergen Reactions   Donepezil Other (See Comments)    Altered mood, aggression, and caused anger   Duloxetine Hcl     Increased confusion and memory concerns    Meloxicam     Pt suspects that this medicine causes fluid on her lungs.    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     HPI: Patient is a 79 y.o. female for follow up.  Pt is here with daughter.  Daughter reports lots of concerned in regards to blood sugar, O2 level, anemia, kidney disease.  Daughter reports there has been some confusion with her mychart and medical records.   Pt had NSTEMI in august, patient presented with chest pain, found to have HsTrop elevated to 4011 and trended down. EKG was without ischemic changes. She underwent a  LHC 03/19/20 with complex atherectomy and DES to pLAD, though unable to restore flow through the mid LAD. She was recommended to continue DAPT uninterrupted for 1 year.    Continues on plavix and ASA  With beta blocker and statin.  Followed up with cardiology 04/22/20, instructed to continue lasix, metoprolol and valsartan.   Acute on chronic heart failure with ischemic cardiomyopathy. Wilder Glade was added to medication regimen but they have not picked up from pharmacy yet.  She continues on metoprolol succinate.  Daughter has been giving lasix AS needed not daily- she reports she had worsening edema and fluid retention and decided to give it. Also not weighting daily  She was given a 2L fluid restriction   Rehab went well. She was very weak when she was admitted but gained a lot of strength in facility and now with home health.   Had Abdominal pain during her hospitalization but this has since resolved - recommended GI outpatient but no longer having any issues, taking protonix 40 mg daily    Review of Systems:  Review of Systems  Constitutional: Negative for chills, fever and weight loss.  HENT: Negative for tinnitus.   Respiratory: Negative for cough, sputum production and shortness of breath.   Cardiovascular: Negative for chest pain, palpitations and leg swelling.  Gastrointestinal: Negative for abdominal pain, constipation, diarrhea and heartburn.  Genitourinary: Negative for dysuria, frequency and urgency.  Musculoskeletal: Negative for back pain, falls, joint pain and myalgias.  Skin: Negative.   Neurological: Negative for dizziness and headaches.  Psychiatric/Behavioral: Positive for memory loss (worse after MI). Negative for  depression. The patient does not have insomnia.     Past Medical History:  Diagnosis Date   AMI 11/17/2008   Qualifier: Diagnosis of  By: Marland Mcalpine     Anxiety    Atherosclerotic peripheral vascular disease ([place])    Bacteremia, escherichia  coli 04/27/2015   Brachial-basilar insufficiency syndrome    CAD (coronary artery disease)    followed by dr [name].   Celiac artery stenosis (HCC)    CHF (congestive heart failure) ([place]) 11/14/2017   Chronic bilateral low back pain without sciatica    Cognitive impairment    Colon polyps    Community acquired pneumonia    Depression    Diverticulosis    Dysuria    Encephalomalacia    GAD (generalized anxiety disorder)    GERD (gastroesophageal reflux disease)    Glaucoma    Hearing loss    Hemorrhoids    Hypertension    Hypothyroidism    Incontinence    Mixed hyperlipidemia    Myocardial infarction (HCC)    OSA on CPAP    Peripheral arterial disease ([place])    Pneumonitis 04/26/2015   Pyelonephritis    Pyelonephritis due to Escherichia coli 04/28/2015   Sepsis ([place])    Sinus drainage    took z-pack   finished yesterday   Spondylosis of lumbar region without myelopathy or radiculopathy    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[place])    Urticaria    Vertigo, benign positional    Past Surgical History:  Procedure Laterality Date   ABDOMINAL AORTOGRAM W/LOWER EXTREMITY N/A 08/22/2017   Procedure: ABDOMINAL AORTOGRAM W/LOWER EXTREMITY;  Surgeon: Serafina Mitchell, MD;  Location: [place] CV LAB;  Service: Cardiovascular;  Laterality: N/A;   BILATERAL UPPER EXTREMITY ANGIOGRAM N/A 09/11/2012   Procedure: BILATERAL UPPER EXTREMITY ANGIOGRAM;  Surgeon: Serafina Mitchell, MD;  Location[facility] C[facility] LAB;  Service: Cardiovascular;  Laterality: N/A;   BIOPSY  03/25/2020   Procedure: BIOPSY;  Surgeon: Irving Copas., MD;  Location: [place];  Service: Gastroenterology;;   carotid duplex doppler Bilateral 09/03/2012, 11/03/2011   Evidence of 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 patent left common carotid-  subclavian artery bypass with turbulent flow noted at the anastomosis with velocities of 295 cm/s   CAROTID-SUBCLAVIAN BYPASS GRAFT  12/15/2011   Procedure: BYPASS GRAFT CAROTID-SUBCLAVIAN;  Surgeon: Serafina Mitchell, MD;  Location[facility];  Service: Vascular;  Laterality: Left;  Left Carotid subclavian bypass   CORONARY ANGIOPLASTY     CORONARY ARTERY BYPASS GRAFT     CORONARY ATHERECTOMY N/A 03/19/2020   Procedure: CORONARY ATHERECTOMY;  Surgeon: Jettie Booze, MD;  Location: [place] CV LAB;  Service: Cardiovascular;  Laterality: N/A;   CORONARY STENT INTERVENTION N/A 03/19/2020   Procedure: CORONARY STENT INTERVENTION;  Surgeon: Jettie Booze, MD;  Location: [place] CV LAB;  Service: Cardiovascular;  Laterality: N/A;   DOPPLER ECHOCARDIOGRAPHY  05/27/2010, 09/17/2008   Mild Proximal septal thickening is noted. Left ventricular systolic functions is normal ejection fraction =>55%. the aortic valve appears to be mildly sclerotic    ESOPHAGOGASTRODUODENOSCOPY (EGD) WITH PROPOFOL N/A 03/25/2020   Procedure: ESOPHAGOGASTRODUODENOSCOPY (EGD) WITH PROPOFOL;  Surgeon: Rush Landmark Telford Nab., MD;  Location: [place];  Service: Gastroenterology;  Laterality: N/A;   fem-fem bypass graft  1999   holter monitor  01/21/2008   The predominant rhythm was normal sinus rhythm. Minimum  heartrate of 63 bpm at +01:00, maximum heartrate of 105 bpm at + 10:35; and the average heartrate of 75 bpm. Ventricular ectopic activity totaled 1270: Multifocal; 866-PVC's and 404-VEs              JOINT REPLACEMENT     Left knee   LEFT HEART CATH AND CORS/GRAFTS ANGIOGRAPHY N/A 03/19/2020   Procedure: LEFT HEART CATH AND CORS/GRAFTS ANGIOGRAPHY;  Surgeon: Jettie Booze, MD;  Location: [place] CV LAB;  Service: Cardiovascular;  Laterality: N/A;   LEFT HEART CATHETERIZATION WITH CORONARY/GRAFT ANGIOGRAM N/A 12/21/2011   Procedure: LEFT HEART CATHETERIZATION WITH Beatrix Fetters;   Surgeon: Leonie Man, MD;  [facility] CATH LAB;  Service: Cardiovascular;  Laterality: N/A;   NM MYOCAR PERF EJECTION FRACTION  09/22/2009, 07/03/2007   the post stress myocardial perfusion images show a normal pattern of perfusion is all regions. The post-stress ejection fraction is 68 %. no significant wall motion abnormalities noted. This is a low risk scan.   PERIPHERAL VASCULAR INTERVENTION  08/22/2017   Procedure: PERIPHERAL VASCULAR INTERVENTION;  Surgeon: Serafina Mitchell, MD;  Location: [place] [facility] LAB;  Service: Cardiovascular;;  Fem/Fem Graft   REPLACEMENT TOTAL KNEE  05-2011   UNILATERAL UPPER EXTREMEITY ANGIOGRAM Left 11/15/2011   Procedure: UNILATERAL UPPER Anselmo Rod;  Surgeon: Lorretta Harp, MD;  Location: [facility] CATH LAB;  Service: Cardiovascular;  Laterality: Left;   Social History:   reports that she quit smoking about 38 years ago. Her smoking use included cigarettes. She quit after 3.00 years of use. She has never used smokeless tobacco. She reports previous alcohol use. She reports that she does not use drugs.  Family History  Problem Relation Age of Onset   Heart attack Mother    Heart disease Mother        before age 32   Diabetes Father    Heart disease Father    Hypertension Father    Hyperlipidemia Father    Heart attack Father 3   Heart attack Brother 71   Cerebral palsy Sister 72   Congestive Heart Failure Sister 66   Heart attack Sister 55   Hypertension Sister 71   Dementia Sister    Anxiety disorder Sister    Anxiety disorder Sister 32   Heart Problems Sister    Stroke Sister    Heart Problems Sister 40   Heart attack Sister 84   Colon cancer Brother 36   Prostate cancer Brother 72   Hypertension Daughter    Irritable bowel syndrome Daughter    Depression Daughter    Anxiety disorder Daughter     Medications: Patient's Medications  New Prescriptions   No medications on file  Previous Medications     ACETAMINOPHEN (TYLENOL) 325 MG TABLET    Take 325 mg by mouth in the morning and at bedtime. 1 by mouth in the morning and 1 by mouth in the pm.   ALPRAZOLAM (XANAX) 0.5 MG TABLET    Take 1 tablet (0.5 mg total) by mouth daily as needed for anxiety.   ASPIRIN EC 81 MG TABLET    Take 81 mg by mouth at bedtime.    BRIMONIDINE (ALPHAGAN) 0.2 % OPHTHALMIC SOLUTION    Place 1 drop into both eyes 2 (two) times daily.   CALCIUM CARBONATE (TUMS - DOSED IN MG ELEMENTAL CALCIUM) 500 MG CHEWABLE TABLET    Chew 1 tablet by mouth daily as needed for indigestion or heartburn.   CHOLECALCIFEROL (VITAMIN D)  400 UNITS TABS TABLET    Take 400 Units by mouth daily.   CLOPIDOGREL (PLAVIX) 75 MG TABLET    TAKE 1 TABLET (75 MG TOTAL) BY MOUTH DAILY.   DAPAGLIFLOZIN PROPANEDIOL (FARXIGA) 10 MG TABS TABLET    Take 1 tablet (10 mg total) by mouth daily.   DIVALPROEX (DEPAKOTE ER) 250 MG 24 HR TABLET    TAKE 1 TABLET(250 MG) BY MOUTH TWICE DAILY   DORZOLAMIDE (TRUSOPT) 2 % OPHTHALMIC SOLUTION    Place 1 drop into both eyes 2 (two) times daily.   ESCITALOPRAM (LEXAPRO) 20 MG TABLET    TAKE 1 TABLET(20 MG) BY MOUTH DAILY   FUROSEMIDE (LASIX) 40 MG TABLET    Take 1 tablet (40 mg total) by mouth daily. Please continue daily dosing until your follow up cardiology appt.   GABAPENTIN (NEURONTIN) 100 MG CAPSULE    Take 1 capsule (100 mg total) by mouth 2 (two) times daily as needed.   LATANOPROST (XALATAN) 0.005 % OPHTHALMIC SOLUTION    Place 1 drop into both eyes at bedtime.   LEVOTHYROXINE (SYNTHROID) 100 MCG TABLET    TAKE 1 TABLET BY MOUTH DAILY BEFORE BREAKFAST WITH 88 MCG   LEVOTHYROXINE (SYNTHROID) 88 MCG TABLET    TAKE 1 TABLET BY MOUTH DAILY BEFORE BREAKFAST, TAKE WITH 100 MCG TABLET TO EQUAL 188 MCG TOTAL   MECLIZINE (ANTIVERT) 25 MG TABLET    Take 1 tablet (25 mg total) by mouth as needed for dizziness.   METOPROLOL SUCCINATE (TOPROL-XL) 25 MG 24 HR TABLET    Take 1 tablet (25 mg total) by mouth daily.   MIRABEGRON ER  (MYRBETRIQ) 50 MG TB24 TABLET    Take 50 mg by mouth daily.   NITROGLYCERIN (NITROSTAT) 0.4 MG SL TABLET    Place 1 tablet (0.4 mg total) under the tongue every 5 (five) minutes x 3 doses as needed for chest pain.   PANTOPRAZOLE (PROTONIX) 40 MG TABLET    TAKE 1 TABLET EVERY DAY   ROSUVASTATIN (CRESTOR) 40 MG TABLET    Take 1 tablet (40 mg total) by mouth daily.   TIZANIDINE (ZANAFLEX) 2 MG TABLET    Take 1 tablet (2 mg total) by mouth every 6 (six) hours as needed for muscle spasms.   VALSARTAN (DIOVAN) 40 MG TABLET    Take 1 tablet (40 mg total) by mouth every morning.   VITAMIN B-12 (CYANOCOBALAMIN) 1000 MCG TABLET    Take 1,000 mcg by mouth daily.  Modified Medications   No medications on file  Discontinued Medications   No medications on file    Physical Exam:  Vitals:   05/06/20 1501  BP: 120/70  Pulse: 88  Temp: 97.9 F (36.6 C)  TempSrc: Temporal  SpO2: 93%  Weight: 205 lb 3.2 oz (93.1 kg)  Height: 5\' 2"  (1.575 m)   There is no height or weight on file to calculate BMI. Wt Readings from Last 3 Encounters:  04/22/20 206 lb 6.4 oz (93.6 kg)  04/01/20 199 lb 4.7 oz (90.4 kg)  12/13/19 216 lb (98 kg)    Physical Exam Constitutional:      General: She is not in acute distress.    Appearance: She is well-developed. She is not diaphoretic.  HENT:     Head: Normocephalic and atraumatic.     Mouth/Throat:     Pharynx: No oropharyngeal exudate.  Eyes:     Conjunctiva/sclera: Conjunctivae normal.     Pupils: Pupils are equal, round, and reactive  to light.  Cardiovascular:     Rate and Rhythm: Normal rate and regular rhythm.     Heart sounds: Normal heart sounds.  Pulmonary:     Effort: Pulmonary effort is normal.     Breath sounds: Normal breath sounds.  Abdominal:     General: Bowel sounds are normal.     Palpations: Abdomen is soft.  Musculoskeletal:        General: No tenderness.     Cervical back: Normal range of motion and neck supple.     Right lower leg: No  edema.     Left lower leg: No edema.  Skin:    General: Skin is warm and dry.  Neurological:     Mental Status: She is alert. Mental status is at baseline.  Psychiatric:        Mood and Affect: Mood normal.        Behavior: Behavior normal.     Labs reviewed: Basic Metabolic Panel: Recent Labs    08/16/19 1354 08/16/19 1354 12/11/19 1055 03/18/20 1537 03/18/20 1918 03/19/20 0343 03/25/20 1405 03/26/20 0500 03/28/20 1802 03/28/20 1802 03/29/20 0405 03/29/20 0405 03/30/20 0730 03/31/20 0704 04/01/20 0839 04/22/20 1118  NA 138   < > 139   < >  --    < > 138   < > 134*   < > 135   < >  --  138 135 144  K 4.5   < > 4.5   < >  --    < > 4.3   < > 3.9   < > 3.7   < >  --  4.2 4.4 4.1  CL 102   < > 100   < >  --    < > 95*   < > 91*   < > 90*   < >  --  94* 95* 98  CO2 25   < > 28   < >  --    < > 32   < > 30   < > 32   < >  --  31 30 30*  GLUCOSE 100*   < > 110*   < >  --    < > 108*   < > 146*   < > 114*   < >  --  123* 139* 99  BUN 13   < > 16   < >  --    < > 24*   < > 36*   < > 35*   < >  --  32* 29* 25  CREATININE 1.04*   < > 1.25*   < >  --    < > 1.38*   < > 1.83*   < > 1.64*   < >  --  1.52* 1.46* 1.57*  CALCIUM 9.4   < > 9.5   < >  --    < > 9.4   < > 9.3   < > 9.3   < >  --  9.6 9.3 9.0  MG  --   --   --    < > 1.8  --  2.2   < > 2.5*  --  2.4  --  2.5*  --   --   --   PHOS  --   --   --   --   --   --  4.5  --   --   --   --   --   --   --   --   --  TSH 0.13*  --  8.24*  --  0.394  --   --   --   --   --   --   --   --   --   --   --    < > = values in this interval not displayed.   Liver Function Tests: Recent Labs    12/11/19 1055 03/18/20 1918 03/23/20 1155  AST 15 28 15   ALT 10 9 8   ALKPHOS  --  50 47  BILITOT 0.9 1.4* 0.9  PROT 6.5 5.9* 5.4*  ALBUMIN  --  3.5 2.6*   Recent Labs    03/23/20 1155  LIPASE 24  AMYLASE 19*   No results for input(s): AMMONIA in the last 8760 hours. CBC: Recent Labs    08/16/19 1354 03/18/20 1537 03/31/20 0704  04/01/20 0839 04/22/20 1118  WBC 8.5   < > 6.2 6.4 5.7  NEUTROABS 4,412  --   --   --   --   HGB 13.5   < > 10.5* 10.3* 10.2*  HCT 40.0   < > 33.3* 32.7* 30.6*  MCV 85.8   < > 86.5 87.4 86  PLT 154   < > 248 231 108*   < > = values in this interval not displayed.   Lipid Panel: Recent Labs    08/16/19 1354 12/11/19 1055 03/19/20 0343  CHOL 146 143 90  HDL 33* 37* 26*  LDLCALC 83 74 30  TRIG 206* 229* 172*  CHOLHDL 4.4 3.9 3.5   TSH: Recent Labs    08/16/19 1354 12/11/19 1055 03/18/20 1918  TSH 0.13* 8.24* 0.394   A1C: Lab Results  Component Value Date   HGBA1C 5.8 (H) 03/18/2020     Assessment/Plan 1. Acute right-sided low back pain with right-sided sciatica Has improved at this time, previously on gabapentin but no longer taking. Will remove from list   2. Hyperlipidemia LDL goal <70 LDL at goal on last labs. Continues on crestor 40 mg daily  3. Acquired hypothyroidism Continues on synthroid 180 mcg daily, TSH at goal on last labs.   4. Chronic congestive heart failure, unspecified heart failure type ([place]) -appears euvolemic at this time but she has not giving lasix routinely or checking daily weights. Has had 2 episodes were the daughter thought she may need to take her back to the hospital due to fluid retention. Low sodium diet in place.  -2L fluid restriction was noted during hospitalization  Plans to start daily weight  - furosemide (LASIX) 40 MG tablet; Take 1 tablet (40 mg total) by mouth daily.  Dispense: 30 tablet; Refill: 2  5. Need for influenza vaccination - Flu Vaccine QUAD High Dose(Fluad)  6. Essential hypertension -well controlled at this time. Continues on metoprolol, valsartan   7. Overactive bladder -ongoing, worse with lasix use, discussed making sure to keep pad clean and dry.continues on myrbetriq  8. Memory loss Progressive decline in memory noted, lives with daughter who is her primary caregiver. Supportive care encouraged.    9. Physical deconditioning -has improved since hospitalization in rehab but still working to improve. Home health has been ordered PT/OT/nursing and ST  10. GERD Well controlled, no issues with GERD, abdominal pain, etc.  Continues on protonix 40 daily with no issues.   Next appt: 4 months  Deanna [name] K. Edmonson, [facility] [phone]

## 2020-05-06 NOTE — Telephone Encounter (Signed)
Left on approval on voicemail for Maggie.

## 2020-05-06 NOTE — Telephone Encounter (Signed)
Calling to follow up on approval for OT, Speech, PT. Evaluations done.   [phone] Ask for voicemail if any meeting.   A verbal approval is fine. To Joelene Millin.

## 2020-05-06 NOTE — Telephone Encounter (Signed)
Okay to continue, seen in office today for face to face

## 2020-05-06 NOTE — Patient Instructions (Signed)
To restart lasix 40 mg daily

## 2020-05-08 DIAGNOSIS — N189 Chronic kidney disease, unspecified: Secondary | ICD-10-CM | POA: Diagnosis not present

## 2020-05-08 DIAGNOSIS — H409 Unspecified glaucoma: Secondary | ICD-10-CM | POA: Diagnosis not present

## 2020-05-08 DIAGNOSIS — I739 Peripheral vascular disease, unspecified: Secondary | ICD-10-CM | POA: Diagnosis not present

## 2020-05-08 DIAGNOSIS — I2510 Atherosclerotic heart disease of native coronary artery without angina pectoris: Secondary | ICD-10-CM | POA: Diagnosis not present

## 2020-05-08 DIAGNOSIS — I509 Heart failure, unspecified: Secondary | ICD-10-CM | POA: Diagnosis not present

## 2020-05-08 DIAGNOSIS — I130 Hypertensive heart and chronic kidney disease with heart failure and stage 1 through stage 4 chronic kidney disease, or unspecified chronic kidney disease: Secondary | ICD-10-CM | POA: Diagnosis not present

## 2020-05-08 DIAGNOSIS — H9193 Unspecified hearing loss, bilateral: Secondary | ICD-10-CM | POA: Diagnosis not present

## 2020-05-08 DIAGNOSIS — I214 Non-ST elevation (NSTEMI) myocardial infarction: Secondary | ICD-10-CM | POA: Diagnosis not present

## 2020-05-08 DIAGNOSIS — D631 Anemia in chronic kidney disease: Secondary | ICD-10-CM | POA: Diagnosis not present

## 2020-05-13 DIAGNOSIS — H9193 Unspecified hearing loss, bilateral: Secondary | ICD-10-CM | POA: Diagnosis not present

## 2020-05-13 DIAGNOSIS — I130 Hypertensive heart and chronic kidney disease with heart failure and stage 1 through stage 4 chronic kidney disease, or unspecified chronic kidney disease: Secondary | ICD-10-CM | POA: Diagnosis not present

## 2020-05-13 DIAGNOSIS — I509 Heart failure, unspecified: Secondary | ICD-10-CM | POA: Diagnosis not present

## 2020-05-13 DIAGNOSIS — I739 Peripheral vascular disease, unspecified: Secondary | ICD-10-CM | POA: Diagnosis not present

## 2020-05-13 DIAGNOSIS — I214 Non-ST elevation (NSTEMI) myocardial infarction: Secondary | ICD-10-CM | POA: Diagnosis not present

## 2020-05-13 DIAGNOSIS — I2510 Atherosclerotic heart disease of native coronary artery without angina pectoris: Secondary | ICD-10-CM | POA: Diagnosis not present

## 2020-05-13 DIAGNOSIS — H409 Unspecified glaucoma: Secondary | ICD-10-CM | POA: Diagnosis not present

## 2020-05-13 DIAGNOSIS — D631 Anemia in chronic kidney disease: Secondary | ICD-10-CM | POA: Diagnosis not present

## 2020-05-13 DIAGNOSIS — N189 Chronic kidney disease, unspecified: Secondary | ICD-10-CM | POA: Diagnosis not present

## 2020-05-14 DIAGNOSIS — I214 Non-ST elevation (NSTEMI) myocardial infarction: Secondary | ICD-10-CM | POA: Diagnosis not present

## 2020-05-14 DIAGNOSIS — D631 Anemia in chronic kidney disease: Secondary | ICD-10-CM | POA: Diagnosis not present

## 2020-05-14 DIAGNOSIS — N189 Chronic kidney disease, unspecified: Secondary | ICD-10-CM | POA: Diagnosis not present

## 2020-05-14 DIAGNOSIS — H409 Unspecified glaucoma: Secondary | ICD-10-CM | POA: Diagnosis not present

## 2020-05-14 DIAGNOSIS — H9193 Unspecified hearing loss, bilateral: Secondary | ICD-10-CM | POA: Diagnosis not present

## 2020-05-14 DIAGNOSIS — I739 Peripheral vascular disease, unspecified: Secondary | ICD-10-CM | POA: Diagnosis not present

## 2020-05-14 DIAGNOSIS — I509 Heart failure, unspecified: Secondary | ICD-10-CM | POA: Diagnosis not present

## 2020-05-14 DIAGNOSIS — I130 Hypertensive heart and chronic kidney disease with heart failure and stage 1 through stage 4 chronic kidney disease, or unspecified chronic kidney disease: Secondary | ICD-10-CM | POA: Diagnosis not present

## 2020-05-14 DIAGNOSIS — I2510 Atherosclerotic heart disease of native coronary artery without angina pectoris: Secondary | ICD-10-CM | POA: Diagnosis not present

## 2020-05-15 DIAGNOSIS — H409 Unspecified glaucoma: Secondary | ICD-10-CM | POA: Diagnosis not present

## 2020-05-15 DIAGNOSIS — I2510 Atherosclerotic heart disease of native coronary artery without angina pectoris: Secondary | ICD-10-CM | POA: Diagnosis not present

## 2020-05-15 DIAGNOSIS — N189 Chronic kidney disease, unspecified: Secondary | ICD-10-CM | POA: Diagnosis not present

## 2020-05-15 DIAGNOSIS — D631 Anemia in chronic kidney disease: Secondary | ICD-10-CM | POA: Diagnosis not present

## 2020-05-15 DIAGNOSIS — I739 Peripheral vascular disease, unspecified: Secondary | ICD-10-CM | POA: Diagnosis not present

## 2020-05-15 DIAGNOSIS — I509 Heart failure, unspecified: Secondary | ICD-10-CM | POA: Diagnosis not present

## 2020-05-15 DIAGNOSIS — H9193 Unspecified hearing loss, bilateral: Secondary | ICD-10-CM | POA: Diagnosis not present

## 2020-05-15 DIAGNOSIS — I214 Non-ST elevation (NSTEMI) myocardial infarction: Secondary | ICD-10-CM | POA: Diagnosis not present

## 2020-05-15 DIAGNOSIS — I130 Hypertensive heart and chronic kidney disease with heart failure and stage 1 through stage 4 chronic kidney disease, or unspecified chronic kidney disease: Secondary | ICD-10-CM | POA: Diagnosis not present

## 2020-05-19 DIAGNOSIS — H9193 Unspecified hearing loss, bilateral: Secondary | ICD-10-CM | POA: Diagnosis not present

## 2020-05-19 DIAGNOSIS — I2510 Atherosclerotic heart disease of native coronary artery without angina pectoris: Secondary | ICD-10-CM | POA: Diagnosis not present

## 2020-05-19 DIAGNOSIS — D631 Anemia in chronic kidney disease: Secondary | ICD-10-CM | POA: Diagnosis not present

## 2020-05-19 DIAGNOSIS — I509 Heart failure, unspecified: Secondary | ICD-10-CM | POA: Diagnosis not present

## 2020-05-19 DIAGNOSIS — I130 Hypertensive heart and chronic kidney disease with heart failure and stage 1 through stage 4 chronic kidney disease, or unspecified chronic kidney disease: Secondary | ICD-10-CM | POA: Diagnosis not present

## 2020-05-19 DIAGNOSIS — I214 Non-ST elevation (NSTEMI) myocardial infarction: Secondary | ICD-10-CM | POA: Diagnosis not present

## 2020-05-19 DIAGNOSIS — N189 Chronic kidney disease, unspecified: Secondary | ICD-10-CM | POA: Diagnosis not present

## 2020-05-19 DIAGNOSIS — H409 Unspecified glaucoma: Secondary | ICD-10-CM | POA: Diagnosis not present

## 2020-05-19 DIAGNOSIS — I739 Peripheral vascular disease, unspecified: Secondary | ICD-10-CM | POA: Diagnosis not present

## 2020-05-21 ENCOUNTER — Other Ambulatory Visit: Payer: Self-pay | Admitting: Nurse Practitioner

## 2020-05-21 NOTE — Telephone Encounter (Signed)
Pharmacy requested refill. Pended Rx and sent to Jessica for approval due to HIGH ALERT Warning.  

## 2020-05-21 NOTE — Telephone Encounter (Signed)
I called and spoke with Daughter and confirmed patient is taking Valsartan 80mg  daily. Medication list updated and sent to [facility] for approval.

## 2020-05-21 NOTE — Telephone Encounter (Signed)
The prescription in her chart says she is taking valsartan 40 mg daily not 80. Can you call the patient's daughter to clarify.  Thank you.

## 2020-05-25 DIAGNOSIS — I739 Peripheral vascular disease, unspecified: Secondary | ICD-10-CM | POA: Diagnosis not present

## 2020-05-25 DIAGNOSIS — I214 Non-ST elevation (NSTEMI) myocardial infarction: Secondary | ICD-10-CM | POA: Diagnosis not present

## 2020-05-25 DIAGNOSIS — I130 Hypertensive heart and chronic kidney disease with heart failure and stage 1 through stage 4 chronic kidney disease, or unspecified chronic kidney disease: Secondary | ICD-10-CM | POA: Diagnosis not present

## 2020-05-25 DIAGNOSIS — H9193 Unspecified hearing loss, bilateral: Secondary | ICD-10-CM | POA: Diagnosis not present

## 2020-05-25 DIAGNOSIS — I2510 Atherosclerotic heart disease of native coronary artery without angina pectoris: Secondary | ICD-10-CM | POA: Diagnosis not present

## 2020-05-25 DIAGNOSIS — D631 Anemia in chronic kidney disease: Secondary | ICD-10-CM | POA: Diagnosis not present

## 2020-05-25 DIAGNOSIS — I509 Heart failure, unspecified: Secondary | ICD-10-CM | POA: Diagnosis not present

## 2020-05-25 DIAGNOSIS — N189 Chronic kidney disease, unspecified: Secondary | ICD-10-CM | POA: Diagnosis not present

## 2020-05-25 DIAGNOSIS — H409 Unspecified glaucoma: Secondary | ICD-10-CM | POA: Diagnosis not present

## 2020-05-26 ENCOUNTER — Ambulatory Visit: Payer: Medicare HMO | Admitting: Internal Medicine

## 2020-05-27 DIAGNOSIS — N189 Chronic kidney disease, unspecified: Secondary | ICD-10-CM | POA: Diagnosis not present

## 2020-05-27 DIAGNOSIS — D631 Anemia in chronic kidney disease: Secondary | ICD-10-CM | POA: Diagnosis not present

## 2020-05-27 DIAGNOSIS — I509 Heart failure, unspecified: Secondary | ICD-10-CM | POA: Diagnosis not present

## 2020-05-27 DIAGNOSIS — I214 Non-ST elevation (NSTEMI) myocardial infarction: Secondary | ICD-10-CM | POA: Diagnosis not present

## 2020-05-27 DIAGNOSIS — H409 Unspecified glaucoma: Secondary | ICD-10-CM | POA: Diagnosis not present

## 2020-05-27 DIAGNOSIS — H9193 Unspecified hearing loss, bilateral: Secondary | ICD-10-CM | POA: Diagnosis not present

## 2020-05-27 DIAGNOSIS — I130 Hypertensive heart and chronic kidney disease with heart failure and stage 1 through stage 4 chronic kidney disease, or unspecified chronic kidney disease: Secondary | ICD-10-CM | POA: Diagnosis not present

## 2020-05-27 DIAGNOSIS — I2510 Atherosclerotic heart disease of native coronary artery without angina pectoris: Secondary | ICD-10-CM | POA: Diagnosis not present

## 2020-05-27 DIAGNOSIS — I739 Peripheral vascular disease, unspecified: Secondary | ICD-10-CM | POA: Diagnosis not present

## 2020-06-10 DIAGNOSIS — Z0100 Encounter for examination of eyes and vision without abnormal findings: Secondary | ICD-10-CM | POA: Diagnosis not present

## 2020-06-10 DIAGNOSIS — Z961 Presence of intraocular lens: Secondary | ICD-10-CM | POA: Diagnosis not present

## 2020-06-10 DIAGNOSIS — H52203 Unspecified astigmatism, bilateral: Secondary | ICD-10-CM | POA: Diagnosis not present

## 2020-06-10 DIAGNOSIS — H524 Presbyopia: Secondary | ICD-10-CM | POA: Diagnosis not present

## 2020-06-10 DIAGNOSIS — H35372 Puckering of macula, left eye: Secondary | ICD-10-CM | POA: Diagnosis not present

## 2020-06-10 DIAGNOSIS — H401132 Primary open-angle glaucoma, bilateral, moderate stage: Secondary | ICD-10-CM | POA: Diagnosis not present

## 2020-07-15 ENCOUNTER — Other Ambulatory Visit: Payer: Self-pay

## 2020-07-15 ENCOUNTER — Ambulatory Visit: Payer: Medicare HMO | Admitting: Cardiovascular Disease

## 2020-07-15 ENCOUNTER — Encounter: Payer: Self-pay | Admitting: Cardiovascular Disease

## 2020-07-15 DIAGNOSIS — I25708 Atherosclerosis of coronary artery bypass graft(s), unspecified, with other forms of angina pectoris: Secondary | ICD-10-CM

## 2020-07-15 DIAGNOSIS — I10 Essential (primary) hypertension: Secondary | ICD-10-CM

## 2020-07-15 DIAGNOSIS — I70229 Atherosclerosis of native arteries of extremities with rest pain, unspecified extremity: Secondary | ICD-10-CM

## 2020-07-15 DIAGNOSIS — E782 Mixed hyperlipidemia: Secondary | ICD-10-CM | POA: Diagnosis not present

## 2020-07-15 DIAGNOSIS — G458 Other transient cerebral ischemic attacks and related syndromes: Secondary | ICD-10-CM

## 2020-07-15 NOTE — Assessment & Plan Note (Signed)
History of left subclavian artery stenosis/occlusion status post left common carotid to subclavian bypass by Dr. Trula Slade back in 2013.  Her last Doppler studies performed 06/18/2018 revealed moderate right ICA stenosis, moderate left ICA stenosis with a patent left common carotid to subclavian bypass graft.

## 2020-07-15 NOTE — Assessment & Plan Note (Signed)
History of peripheral arterial disease status post femorofemoral crossover grafting by Dr. Trula Slade 08/22/2017.  He follows her duplex ultrasound.

## 2020-07-15 NOTE — Assessment & Plan Note (Signed)
History of CAD status post anterior wall myocardial infarction in 1994 treated with LAD angioplasty by Dr. [name].  She had circumflex intervention the following year.  Ultimately she required CABG in 1999 by Dr. Arlyce Dice.  I performed cardiac catheterization on her 11/15/2011 revealing a high-grade calcified proximal left subclavian artery stenosis not amenable to percutaneous intervention.  She ultimately required left common carotid to subclavian bypass by Dr. Trula Slade.  She was recently admitted with a non-STEMI 03/18/2020 and was hospitalized for 2 weeks.  She underwent catheterization by Dr. Irish Lack and a complex orbital atherectomy, PCI and stent procedure on her left main/proximal LAD.  He was unable to recanalize the mid LAD however.  The vein to the RCA was occluded but she did have left-to-right collaterals.  The vein to the diagonal branch and the obtuse marginal branch were patent.  Her EF was in the 35% range by 2D echocardiogram.

## 2020-07-15 NOTE — Assessment & Plan Note (Signed)
History of essential hypertension blood pressure measured today at 134/78.  She is on metoprolol and valsartan.

## 2020-07-15 NOTE — Assessment & Plan Note (Signed)
History of hyperlipidemia on statin therapy with lipid profile performed 03/19/2020 revealing a total cholesterol of 90, LDL of 30 and HDL 26.

## 2020-07-15 NOTE — Progress Notes (Signed)
07/15/2020 [name]   01/21/1941  [id number]  Primary Physician Lauree Chandler, NP Primary Cardiologist: Lorretta Harp MD [name], [place]  HPI:  Deanna [name] is a 79 y.o.   moderately overweight divorced Caucasian female mother of one daughter , Deanna [name]. I last saw in the office 12/06/2017. She was formerly a patient of Dr. Lowella Fairy. I ultimately assumed her care. I have done multiple procedures on her in the past as well. Her primary care physician is Dr. Valetta Fuller.  I last saw her in the office 03/27/2019.  She has a history of coronary artery disease status post anterior wall myocardial infarction in 1994 treated with LAD angioplasty by Dr. [name]. She had circumflex intervention the following year. Ultimately she required coronary bypass grafting in 1999 by Dr. Arlyce Dice . 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 Trula Slade. Other problems include to hypertension and hyperlipidemia. She does not smoke. She is not diabetic. She has a strong family history of heart disease. She denies chest pain, shortness of breath or claudication.She had intervention on her femorofemoral crossover graft by Dr. Brabham1/15/19 and is walking better since. She was admitted to the hospital on 11/14/17 for 2 days because of chest pain. She ruled out for myocardial infarction. A chest CT was negative with only small bilateral pleural effusions. BNP was mildly elevated and she was treated with gentle diuresis. A Myoview stress test was nonischemic and low risk. She's had no recurrent symptoms.  Since I saw her a year ago she was admitted to the hospital 03/18/2020 with a non-STEMI.  She underwent cardiac catheterization by Dr.  Irish Lack 03/19/2020 revealing an occluded vein to the RCA, occluded native right coronary artery and circumflex, patent vein to an OM, patent vein to a diagonal branch, high-grade calcified proximal LAD stenosis and occluded mid LAD after this diagonal branch.  She underwent complex orbital atherectomy, PCI and stenting of the proximal LAD by Dr. Irish Lack.  She did have left-to-right collaterals.  Her EF was in the 25 to 35% range.  She also underwent EGD for what was described as upper GI bleed and was treated medically.  After discharge she spent a month at [facility] getting rehabilitation.  She has been home for last 2 months living with her daughter Deanna [name].  She walks with a walker and is slowly getting her strength back.    Current Meds  Medication Sig  . acetaminophen (TYLENOL) 325 MG tablet Take 325 mg by mouth in the morning and at bedtime. 1 by mouth in the morning and 1 by mouth in the pm.  . ALPRAZolam (XANAX) 0.5 MG tablet Take 1 tablet (0.5 mg total) by mouth daily as needed for anxiety.  Marland Kitchen aspirin EC 81 MG tablet Take 81 mg by mouth at bedtime.   . brimonidine (ALPHAGAN) 0.2 % ophthalmic solution Place 1 drop into both eyes 2 (two) times daily.  . calcium carbonate (TUMS - DOSED IN MG ELEMENTAL CALCIUM) 500 MG chewable tablet Chew 1 tablet by mouth daily as needed for indigestion or heartburn.  . cholecalciferol (VITAMIN D) 400 units TABS tablet Take 400 Units by mouth daily.  . clopidogrel (PLAVIX) 75 MG tablet TAKE 1 TABLET (75 MG TOTAL) BY MOUTH DAILY.  . dapagliflozin propanediol (FARXIGA)  10 MG TABS tablet Take 1 tablet (10 mg total) by mouth daily.  . divalproex (DEPAKOTE ER) 250 MG 24 hr tablet TAKE 1 TABLET(250 MG) BY MOUTH TWICE DAILY  . dorzolamide (TRUSOPT) 2 % ophthalmic solution Place 1 drop into both eyes 2 (two) times daily.  Marland Kitchen escitalopram (LEXAPRO) 20 MG tablet TAKE 1 TABLET(20 MG) BY MOUTH DAILY (Patient taking differently: Take 20 mg by mouth daily. )  .  furosemide (LASIX) 40 MG tablet Take 1 tablet (40 mg total) by mouth daily.  Marland Kitchen latanoprost (XALATAN) 0.005 % ophthalmic solution Place 1 drop into both eyes at bedtime.  Marland Kitchen levothyroxine (SYNTHROID) 100 MCG tablet TAKE 1 TABLET BY MOUTH DAILY BEFORE BREAKFAST WITH 88 MCG  . levothyroxine (SYNTHROID) 88 MCG tablet TAKE 1 TABLET BY MOUTH DAILY BEFORE BREAKFAST, TAKE WITH 100 MCG TABLET TO EQUAL 188 MCG TOTAL  . meclizine (ANTIVERT) 25 MG tablet Take 1 tablet (25 mg total) by mouth as needed for dizziness.  . metoprolol succinate (TOPROL-XL) 25 MG 24 hr tablet Take 1 tablet (25 mg total) by mouth daily.  . mirabegron ER (MYRBETRIQ) 50 MG TB24 tablet Take 50 mg by mouth daily.  . nitroGLYCERIN (NITROSTAT) 0.4 MG SL tablet Place 1 tablet (0.4 mg total) under the tongue every 5 (five) minutes x 3 doses as needed for chest pain.  . pantoprazole (PROTONIX) 40 MG tablet TAKE 1 TABLET EVERY DAY  . rosuvastatin (CRESTOR) 40 MG tablet Take 1 tablet (40 mg total) by mouth daily.  Marland Kitchen tiZANidine (ZANAFLEX) 2 MG tablet Take 1 tablet (2 mg total) by mouth every 6 (six) hours as needed for muscle spasms.  . valsartan (DIOVAN) 80 MG tablet TAKE 1 TABLET EVERY MORNING  . vitamin B-12 (CYANOCOBALAMIN) 1000 MCG tablet Take 1,000 mcg by mouth daily.     Allergies  Allergen Reactions  . Donepezil Other (See Comments)    Altered mood, aggression, and caused anger  . Duloxetine Hcl     Increased confusion and memory concerns   . Meloxicam     Pt suspects that this medicine causes fluid on her lungs.   . Memantine Other (See Comments)    Caused severe aggression  . 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 Vicodin [Hydrocodone-Acetaminophen] Other (See Comments)    Delirium, confusion, and toxic dementia    Social History   Socioeconomic History  . Marital status: Divorced    Spouse name: Not on file  . Number of children: Not on file  . Years of education:  Not on file  . Highest education level: Not on file  Occupational History  . Not on file  Tobacco Use  . Smoking status: Former Smoker    Years: 3.00    Types: Cigarettes    Quit date: 11/27/1981    Years since quitting: 38.6  . Smokeless tobacco: Never Used  Vaping Use  . Vaping Use: Never used  Substance and Sexual Activity  . Alcohol use: Not Currently    Alcohol/week: 0.0 standard drinks  . Drug use: No  . Sexual activity: Not Currently    Comment: 1st intercourse 55 yo-1 partner  Other Topics Concern  . Not on file  Social History Narrative   Social History      Diet? Healthy but too many sweets      Do you drink/eat things with caffeine? Yes occasionally      Marital status?  D                What year were you married?      Do you live in a house, apartment, assisted living, condo, trailer, etc.? condo      Is it one or more stories? 1      How many persons live in your home? 2      Do you have any pets in your home? (please list) 1 cat      Highest level of education completed? 1 year college      Current or past profession: housewife      Do you exercise?           no                           Type & how often?      Advanced Directives      Do you have a living will? no      Do you have a DNR form?             no                     If not, do you want to discuss one? yes      Do you have signed POA/HPOA for forms? no      Functional Status      Do you have difficulty bathing or dressing yourself? No- gets tired      Do you have difficulty preparing food or eating? No- eats frozen entrees or poor nutrition meals      Do you have difficulty managing your medications? yes      Do you have difficulty managing your finances? yes      Do you have difficulty affording your medications? No- funds running low   Social Determinants of Health   Financial Resource Strain:   . Difficulty of Paying Living Expenses: Not on file  Food  Insecurity:   . Worried About Charity fundraiser in the Last Year: Not on file  . Ran Out of Food in the Last Year: Not on file  Transportation Needs:   . Lack of Transportation (Medical): Not on file  . Lack of Transportation (Non-Medical): Not on file  Physical Activity:   . Days of Exercise per Week: Not on file  . Minutes of Exercise per Session: Not on file  Stress:   . Feeling of Stress : Not on file  Social Connections:   . Frequency of Communication with Friends and Family: Not on file  . Frequency of Social Gatherings with Friends and Family: Not on file  . Attends Religious Services: Not on file  . Active Member of Clubs or Organizations: Not on file  . Attends Archivist Meetings: Not on file  . Marital Status: Not on file  Intimate Partner Violence:   . Fear of Current or Ex-Partner: Not on file  . Emotionally Abused: Not on file  . Physically Abused: Not on file  . Sexually Abused: Not on file     Review of Systems: General: negative for chills, fever, night sweats or weight changes.  Cardiovascular: negative for chest pain, dyspnea on exertion, edema, orthopnea, palpitations, paroxysmal nocturnal dyspnea or shortness of breath Dermatological: negative for rash Respiratory: negative for cough or wheezing Urologic: negative for hematuria Abdominal: negative for nausea, vomiting, diarrhea, bright red blood per rectum, melena,  or hematemesis Neurologic: negative for visual changes, syncope, or dizziness All other systems reviewed and are otherwise negative except as noted above.    Blood pressure 134/78, pulse 72, height 5\' 1"  (1.549 m), weight 195 lb 12.8 oz (88.8 kg).  General appearance: alert and no distress Neck: no adenopathy, no JVD, supple, symmetrical, trachea midline, thyroid not enlarged, symmetric, no tenderness/mass/nodules and Soft bilateral carotid bruits Lungs: clear to auscultation bilaterally Heart: regular rate and rhythm, S1, S2  normal, no murmur, click, rub or gallop Extremities: extremities normal, atraumatic, no cyanosis or edema Pulses: 2+ and symmetric Skin: Skin color, texture, turgor normal. No rashes or lesions Neurologic: Alert and oriented X 3, normal strength and tone. Normal symmetric reflexes. Normal coordination and gait  EKG not performed today  ASSESSMENT AND PLAN:   Essential hypertension History of essential hypertension blood pressure measured today at 134/78.  She is on metoprolol and valsartan.  Atherosclerosis of coronary artery bypass graft(s), unspecified, with other forms of angina pectoris ([place]) History of CAD status post anterior wall myocardial infarction in 1994 treated with LAD angioplasty by Dr. [name].  She had circumflex intervention the following year.  Ultimately she required CABG in 1999 by Dr. Arlyce Dice.  I performed cardiac catheterization on her 11/15/2011 revealing a high-grade calcified proximal left subclavian artery stenosis not amenable to percutaneous intervention.  She ultimately required left common carotid to subclavian bypass by Dr. Trula Slade.  She was recently admitted with a non-STEMI 03/18/2020 and was hospitalized for 2 weeks.  She underwent catheterization by Dr. Irish Lack and a complex orbital atherectomy, PCI and stent procedure on her left main/proximal LAD.  He was unable to recanalize the mid LAD however.  The vein to the RCA was occluded but she did have left-to-right collaterals.  The vein to the diagonal branch and the obtuse marginal branch were patent.  Her EF was in the 35% range by 2D echocardiogram.  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 Trula Slade back in 2013.  Her last Doppler studies performed 06/18/2018 revealed moderate right ICA stenosis, moderate left ICA stenosis with a patent left common carotid to subclavian bypass  graft.  Hyperlipidemia History of hyperlipidemia on statin therapy with lipid profile performed 03/19/2020 revealing a total cholesterol of 90, LDL of 30 and HDL 26.  Critical lower limb ischemia History of peripheral arterial disease status post femorofemoral crossover grafting by Dr. Trula Slade 08/22/2017.  He follows her duplex ultrasound.      Lorretta Harp MD FACP,FACC,FAHA, [facility] 07/15/2020 3:00 PM

## 2020-07-15 NOTE — Patient Instructions (Signed)
Medication Instructions:  Your physician recommends that you continue on your current medications as directed. Please refer to the Current Medication list given to you today.  *If you need a refill on your cardiac medications before your next appointment, please call your pharmacy*   Follow-Up: At [facility], you and your health needs are our priority.  As part of our continuing mission to provide you with exceptional heart care, we have created designated Provider Care Teams.  These Care Teams include your primary Cardiologist (physician) and Advanced Practice Providers (APPs -  Physician Assistants and Nurse Practitioners) who all work together to provide you with the care you need, when you need it.  We recommend signing up for the patient portal called "MyChart".  Sign up information is provided on this After Visit Summary.  MyChart is used to connect with patients for Virtual Visits (Telemedicine).  Patients are able to view lab/test results, encounter notes, upcoming appointments, etc.  Non-urgent messages can be sent to your provider as well.   To learn more about what you can do with MyChart, go to [email or web address].    Your next appointment:   6 month(s)  The format for your next appointment:   In Person  Provider:   You will see one of the following Advanced Practice Providers on your designated Care Team:    Jesse Cleaver, FNP  Then, Jonathan Berry, MD will plan to see you again in 12 month(s). 

## 2020-07-26 ENCOUNTER — Other Ambulatory Visit: Payer: Self-pay | Admitting: Nurse Practitioner

## 2020-07-26 DIAGNOSIS — E039 Hypothyroidism, unspecified: Secondary | ICD-10-CM

## 2020-07-27 NOTE — Telephone Encounter (Signed)
Pharmacy requested refills.  Pended Rx's and sent to Jessica for approval due to HIGH ALERT Warning.  

## 2020-08-27 ENCOUNTER — Telehealth (INDEPENDENT_AMBULATORY_CARE_PROVIDER_SITE_OTHER): Payer: Medicare HMO | Admitting: Psychiatry

## 2020-08-27 DIAGNOSIS — F39 Unspecified mood [affective] disorder: Secondary | ICD-10-CM | POA: Diagnosis not present

## 2020-08-27 DIAGNOSIS — F411 Generalized anxiety disorder: Secondary | ICD-10-CM | POA: Diagnosis not present

## 2020-08-27 MED ORDER — DIVALPROEX SODIUM ER 250 MG PO TB24: 250.0000 mg | ORAL_TABLET | Freq: Every day | ORAL | 1 refills | Status: DC

## 2020-08-27 MED ORDER — ESCITALOPRAM OXALATE 20 MG PO TABS: 20.0000 mg | ORAL_TABLET | Freq: Every day | ORAL | 1 refills | Status: DC

## 2020-08-27 NOTE — Progress Notes (Signed)
Deanna [name] [id number] 09/20/1940 80 y.o.  Virtual Visit via Telephone Note  I connected with pt on 08/27/20 at  3:15 PM EST by telephone and verified that I am speaking with the correct person using two identifiers.   I discussed the limitations, risks, security and privacy concerns of performing an evaluation and management service by telephone and the availability of in person appointments. I also discussed with the patient that there may be a patient responsible charge related to this service. The patient expressed understanding and agreed to proceed.   I discussed the assessment and treatment plan with the patient. The patient was provided an opportunity to ask questions and all were answered. The patient agreed with the plan and demonstrated an understanding of the instructions.   The patient was advised to call back or seek an in-person evaluation if the symptoms worsen or if the condition fails to improve as anticipated.  I provided 30 minutes of non-face-to-face time during this encounter.  The patient was located at home.  The provider was located at [place].   Thayer Headings, PMHNP   Subjective:   Patient ID:  Deanna [name] is a 80 y.o. (DOB 08/22/1940) female.  Chief Complaint:  Chief Complaint  Patient presents with   Follow-up    H/o anxiety and depression    HPI Deanna [name] presents for follow-up of mood disturbance and anxiety. Pt reports, "I think I am doing great...just accepting whatever." She denies anxiety. Denies depressed mood. Sleeping well. Goes to bed around 9 pm. Appetite has been "too good." Energy has been "not great." Motivation is fair. Denies anhedonia. Denies SI.   Daughter, Deanna [name], reports that pt is occasionally irritable. Daughter reports that pt does not leave the house and will not get out of her chair except to go to the bathroom. Daughter reports that pt will not get up to get herself something to drink. Daughter reports  that pt's cognition continues to decline.   Daughter lives with her and provides care. Daughter reports that they were not able to get Depakote ER for about 3-4 days due to pharmacy issues.   Takes Xanax only when leaving home.  Past Psychiatric Medication Trials: Sertraline-initially helpful and then no longer effective Cymbalta-increased blood pressure Prozac-possible adverse reaction Lexapro Depakote Ativan-effective but caused excessive drowsiness Xanax-effective Aricept-anger Namenda-anger Gabapentin- limited improvement unless she takes higher doses that cause somnolence.  Review of Systems:  Review of Systems  Had MI 4 months ago.  Medications: I have reviewed the patient's current medications.  Current Outpatient Medications  Medication Sig Dispense Refill   aspirin EC 81 MG tablet Take 81 mg by mouth at bedtime.      calcium carbonate (TUMS - DOSED IN MG ELEMENTAL CALCIUM) 500 MG chewable tablet Chew 1 tablet by mouth daily as needed for indigestion or heartburn.     cholecalciferol (VITAMIN D) 400 units TABS tablet Take 400 Units by mouth daily.     clopidogrel (PLAVIX) 75 MG tablet TAKE 1 TABLET (75 MG TOTAL) BY MOUTH DAILY. 90 tablet 1   dapagliflozin propanediol (FARXIGA) 10 MG TABS tablet Take 1 tablet (10 mg total) by mouth daily. 30 tablet 2   dorzolamide (TRUSOPT) 2 % ophthalmic solution Place 1 drop into both eyes 2 (two) times daily.     furosemide (LASIX) 40 MG tablet Take 1 tablet (40 mg total) by mouth daily. 30 tablet 2   latanoprost (XALATAN) 0.005 % ophthalmic solution Place 1 drop into  both eyes at bedtime.     levothyroxine (SYNTHROID) 100 MCG tablet TAKE 1 TABLET BY MOUTH DAILY BEFORE BREAKFAST WITH 88 MCG 90 tablet 0   levothyroxine (SYNTHROID) 88 MCG tablet TAKE 1 TABLET BY MOUTH DAILY BEFORE BREAKFAST, TAKE WITH 100 MCG TABLET TO EQUAL 188 MCG TOTAL 90 tablet 0   meclizine (ANTIVERT) 25 MG tablet Take 1 tablet (25 mg total) by mouth as  needed for dizziness. 30 tablet 0   metoprolol succinate (TOPROL-XL) 25 MG 24 hr tablet Take 1 tablet (25 mg total) by mouth daily. 60 tablet 3   mirabegron ER (MYRBETRIQ) 50 MG TB24 tablet Take 50 mg by mouth daily.     pantoprazole (PROTONIX) 40 MG tablet TAKE 1 TABLET EVERY DAY 90 tablet 1   rosuvastatin (CRESTOR) 40 MG tablet Take 1 tablet (40 mg total) by mouth daily. 90 tablet 3   tiZANidine (ZANAFLEX) 2 MG tablet Take 1 tablet (2 mg total) by mouth every 6 (six) hours as needed for muscle spasms. 30 tablet 5   valsartan (DIOVAN) 80 MG tablet TAKE 1 TABLET EVERY MORNING 90 tablet 1   vitamin B-12 (CYANOCOBALAMIN) 1000 MCG tablet Take 1,000 mcg by mouth daily.     acetaminophen (TYLENOL) 325 MG tablet Take 325 mg by mouth in the morning and at bedtime. 1 by mouth in the morning and 1 by mouth in the pm.     ALPRAZolam (XANAX) 0.5 MG tablet Take 1 tablet (0.5 mg total) by mouth daily as needed for anxiety. 30 tablet 1   brimonidine (ALPHAGAN) 0.2 % ophthalmic solution Place 1 drop into both eyes 2 (two) times daily. (Patient not taking: Reported on 08/27/2020)     divalproex (DEPAKOTE ER) 250 MG 24 hr tablet Take 1 tablet (250 mg total) by mouth at bedtime. 90 tablet 1   escitalopram (LEXAPRO) 20 MG tablet Take 1 tablet (20 mg total) by mouth daily. 90 tablet 1   nitroGLYCERIN (NITROSTAT) 0.4 MG SL tablet Place 1 tablet (0.4 mg total) under the tongue every 5 (five) minutes x 3 doses as needed for chest pain. 25 tablet 1   No current facility-administered medications for this visit.    Medication Side Effects: None  Allergies:  Allergies  Allergen Reactions   Donepezil Other (See Comments)    Altered mood, aggression, and caused anger   Duloxetine Hcl     Increased confusion and memory concerns    Meloxicam     Pt suspects that this medicine causes fluid on her lungs.    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   Celiac artery stenosis (HCC)    CHF (congestive heart failure) ([place]) 11/14/2017   Chronic bilateral low back pain without sciatica    Cognitive impairment    Colon polyps    Community acquired pneumonia    Depression    Diverticulosis    Dysuria    Encephalomalacia  GAD (generalized anxiety disorder)    GERD (gastroesophageal reflux disease)    Glaucoma    Hearing loss    Hemorrhoids    Hypertension    Hypothyroidism    Incontinence    Mixed hyperlipidemia    Myocardial infarction (HCC)    OSA on CPAP    Peripheral arterial disease ([place])    Pneumonitis 04/26/2015   Pyelonephritis    Pyelonephritis due to Escherichia coli 04/28/2015   Sepsis ([place])    Sinus drainage    took z-pack   finished yesterday   Spondylosis of lumbar region without myelopathy or radiculopathy    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[place])    Urticaria    Vertigo, benign positional     Family History  Problem Relation Age of Onset   Heart attack Mother    Heart disease Mother        before age 84   Diabetes Father    Heart disease Father    Hypertension Father    Hyperlipidemia Father    Heart attack Father 87   Heart attack Brother 55   Cerebral palsy Sister 74   Congestive Heart Failure Sister 34   Heart attack Sister 42   Hypertension Sister 73   Dementia Sister    Anxiety disorder Sister    Anxiety disorder Sister 62   Heart Problems Sister    Stroke Sister     Heart Problems Sister 26   Heart attack Sister 24   Colon cancer Brother 50   Prostate cancer Brother 62   Hypertension Daughter    Irritable bowel syndrome Daughter    Depression Daughter    Anxiety disorder Daughter     Social History   Socioeconomic History   Marital status: Divorced    Spouse name: Not on file   Number of children: Not on file   Years of education: Not on file   Highest education level: Not on file  Occupational History   Not on file  Tobacco Use   Smoking status: Former Smoker    Years: 3.00    Types: Cigarettes    Quit date: 11/27/1981    Years since quitting: 38.7   Smokeless tobacco: Never Used  Vaping Use   Vaping Use: Never used  Substance and Sexual Activity   Alcohol use: Not Currently    Alcohol/week: 0.0 standard drinks   Drug use: No   Sexual activity: Not Currently    Comment: 1st intercourse 23 yo-1 partner  Other Topics Concern   Not on file  Social History Narrative   Social History      Diet? Healthy but too many sweets      Do you drink/eat things with caffeine? Yes occasionally      Marital status?                    D                What year were you married?      Do you live in a house, apartment, assisted living, condo, trailer, etc.? condo      Is it one or more stories? 1      How many persons live in your home? 2      Do you have any pets in your home? (please list) 1 cat      Highest level of education completed? 1 year college      Current or past profession: housewife  Do you exercise?           no                           Type & how often?      Advanced Directives      Do you have a living will? no      Do you have a DNR form?             no                     If not, do you want to discuss one? yes      Do you have signed POA/HPOA for forms? no      Functional Status      Do you have difficulty bathing or dressing yourself? No- gets tired      Do you have difficulty  preparing food or eating? No- eats frozen entrees or poor nutrition meals      Do you have difficulty managing your medications? yes      Do you have difficulty managing your finances? yes      Do you have difficulty affording your medications? No- funds running low   Social Determinants of Radio broadcast assistant Strain: Not on file  Food Insecurity: Not on file  Transportation Needs: Not on file  Physical Activity: Not on file  Stress: Not on file  Social Connections: Not on file  Intimate Partner Violence: Not on file    Past Medical History, Surgical history, Social history, and Family history were reviewed and updated as appropriate.   Please see review of systems for further details on the patient's review from today.   Objective:   Physical Exam:  There were no vitals taken for this visit.  Physical Exam Neurological:     Mental Status: She is alert and oriented to person, place, and time.     Cranial Nerves: No dysarthria.  Psychiatric:        Attention and Perception: Attention and perception normal.        Mood and Affect: Mood normal.        Speech: Speech normal.        Behavior: Behavior is cooperative.        Thought Content: Thought content normal. Thought content is not paranoid or delusional. Thought content does not include homicidal or suicidal ideation. Thought content does not include homicidal or suicidal plan.        Cognition and Memory: Cognition is impaired. She exhibits impaired recent memory.        Judgment: Judgment normal.     Comments: [employer] fair     Lab Review:     Component Value Date/Time   NA 144 04/22/2020 1118   K 4.1 04/22/2020 1118   CL 98 04/22/2020 1118   CO2 30 (H) 04/22/2020 1118   GLUCOSE 99 04/22/2020 1118   GLUCOSE 139 (H) 04/01/2020 0839   BUN 25 04/22/2020 1118   CREATININE 1.57 (H) 04/22/2020 1118   CREATININE 1.25 (H) 12/11/2019 1055   CALCIUM 9.0 04/22/2020 1118   PROT 5.4 (L) 03/23/2020 1155   ALBUMIN  2.6 (L) 03/23/2020 1155   AST 15 03/23/2020 1155   ALT 8 03/23/2020 1155   ALKPHOS 47 03/23/2020 1155   BILITOT 0.9 03/23/2020 1155   GFRNONAA 31 (L) 04/22/2020 1118   GFRNONAA 41 (L) 12/11/2019 1055   GFRAA 36 (  L) 04/22/2020 1118   GFRAA 48 (L) 12/11/2019 1055       Component Value Date/Time   WBC 5.7 04/22/2020 1118   WBC 6.4 04/01/2020 0839   RBC 3.56 (L) 04/22/2020 1118   RBC 3.74 (L) 04/01/2020 0839   HGB 10.2 (L) 04/22/2020 1118   HCT 30.6 (L) 04/22/2020 1118   PLT 108 (L) 04/22/2020 1118   MCV 86 04/22/2020 1118   MCH 28.7 04/22/2020 1118   MCH 27.5 04/01/2020 0839   MCHC 33.3 04/22/2020 1118   MCHC 31.5 04/01/2020 0839   RDW 13.5 04/22/2020 1118   LYMPHSABS 3,349 08/16/2019 1354   MONOABS 0.5 04/27/2015 0310   EOSABS 34 08/16/2019 1354   BASOSABS 43 08/16/2019 1354    No results found for: POCLITH, LITHIUM   No results found for: PHENYTOIN, PHENOBARB, VALPROATE, CBMZ   .res Assessment: Plan:   Discussed benefits and risks of dose reduction in Depakote since pt has not had any recent episodes of agitation and Depakote could potentially be causing some psychomotor retardation. Daughter agrees to dose decrease to Depakote ER 250 mg po QHS only.  Continue Lexapro 20 mg po qd for anxiety and depression since daughter reports significant worsening in pt's mood and anxiety without Lexapro.  Pt to follow-up in 3 months or sooner if clinically indicated.  Patient advised to contact office with any questions, adverse effects, or acute worsening in signs and symptoms.  Shade was seen today for follow-up.  Diagnoses and all orders for this visit:  Mood disorder ([place]) -     divalproex (DEPAKOTE ER) 250 MG 24 hr tablet; Take 1 tablet (250 mg total) by mouth at bedtime. -     escitalopram (LEXAPRO) 20 MG tablet; Take 1 tablet (20 mg total) by mouth daily.  Generalized anxiety disorder -     escitalopram (LEXAPRO) 20 MG tablet; Take 1 tablet (20 mg total) by mouth  daily.    Please see After Visit Summary for patient specific instructions.  Future Appointments  Date Time Provider [place]  09/02/2020  1:30 PM Lauree Chandler, NP [facility] None  11/25/2020 12:45 PM Thayer Headings, PMHNP [facility] None    No orders of the defined types were placed in this encounter.     -------------------------------

## 2020-08-28 ENCOUNTER — Encounter: Payer: Self-pay | Admitting: Psychiatry

## 2020-09-02 ENCOUNTER — Other Ambulatory Visit: Payer: Self-pay

## 2020-09-02 ENCOUNTER — Ambulatory Visit (INDEPENDENT_AMBULATORY_CARE_PROVIDER_SITE_OTHER): Payer: Medicare HMO | Admitting: Nurse Practitioner

## 2020-09-02 ENCOUNTER — Encounter: Payer: Self-pay | Admitting: Nurse Practitioner

## 2020-09-02 VITALS — BP 118/72 | HR 68 | Temp 96.9°F | Ht 61.0 in | Wt 190.0 lb

## 2020-09-02 DIAGNOSIS — F0391 Unspecified dementia with behavioral disturbance: Secondary | ICD-10-CM

## 2020-09-02 DIAGNOSIS — E785 Hyperlipidemia, unspecified: Secondary | ICD-10-CM | POA: Diagnosis not present

## 2020-09-02 DIAGNOSIS — M5441 Lumbago with sciatica, right side: Secondary | ICD-10-CM | POA: Diagnosis not present

## 2020-09-02 DIAGNOSIS — I10 Essential (primary) hypertension: Secondary | ICD-10-CM

## 2020-09-02 DIAGNOSIS — R5381 Other malaise: Secondary | ICD-10-CM | POA: Diagnosis not present

## 2020-09-02 DIAGNOSIS — I509 Heart failure, unspecified: Secondary | ICD-10-CM

## 2020-09-02 DIAGNOSIS — E039 Hypothyroidism, unspecified: Secondary | ICD-10-CM

## 2020-09-02 DIAGNOSIS — K219 Gastro-esophageal reflux disease without esophagitis: Secondary | ICD-10-CM

## 2020-09-02 DIAGNOSIS — G8929 Other chronic pain: Secondary | ICD-10-CM

## 2020-09-02 DIAGNOSIS — N3281 Overactive bladder: Secondary | ICD-10-CM

## 2020-09-02 MED ORDER — GABAPENTIN 100 MG PO CAPS: 200.0000 mg | ORAL_CAPSULE | Freq: Every day | ORAL | 1 refills | Status: DC

## 2020-09-02 MED ORDER — CLOPIDOGREL BISULFATE 75 MG PO TABS: 75.0000 mg | ORAL_TABLET | Freq: Every day | ORAL | 1 refills | Status: DC

## 2020-09-02 NOTE — Progress Notes (Signed)
Careteam: Patient Care Team: Lauree Chandler, NP as PCP - General (Geriatric Medicine) Lorretta Harp, MD as PCP - Cardiology (Cardiology) Irene Shipper, MD as Consulting Physician (Gastroenterology) Frederik Pear, MD as Consulting Physician (Orthopedic Surgery) Fontaine, Belinda Block, MD (Inactive) as Consulting Physician (Gynecology) Rutherford Guys, MD as Consulting Physician (Ophthalmology) Bjorn Loser, MD as Consulting Physician (Urology) Serafina Mitchell, MD as Consulting Physician (Vascular Surgery) Cameron Sprang, MD as Consulting Physician (Neurology) Desma Maxim, MD as Referring Physician (Ophthalmology) Thayer Headings, PMHNP as Nurse Practitioner (Psychiatry) Cameron Sprang, MD as Consulting Physician (Neurology)  PLACE OF SERVICE:  [place]  Advanced Directive information    Allergies  Allergen Reactions  . Donepezil Other (See Comments)    Altered mood, aggression, and caused anger  . Duloxetine Hcl     Increased confusion and memory concerns   . Meloxicam     Pt suspects that this medicine causes fluid on her lungs.   . Memantine Other (See Comments)    Caused severe aggression  . 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 Vicodin [Hydrocodone-Acetaminophen] Other (See Comments)    Delirium, confusion, and toxic dementia    Chief Complaint  Patient presents with  . Medical Management of Chronic Issues    4 month follow-up, discuss need for Hep C screening and TD/Tdap or exclude. Daughter is concerned about weight loss and fatigue. Marland Kitchen Discuss if Janett Billow will take over refilling Plavix. Here with daughter [name]      HPI: Patient is a 80 y.o. female four routine follow up. Pt here with her daughter  Physical deconditioning/Dementia- Pt daughter reports that she is declining since previous hospital admission. Does not want to get up and walk, fix herself a meal, or get out of the house. Pt  says she is lazy most days. Pt daughter is worried that she will lose her strength altogether, very concerned about her current condition. No motivation to get up and be active, becomes angry when told to do something. Recently saw a psychiatrist that suggested decreasing Depakote to once daily to stimulate pt. They just decreased Depakote 4 days ago. Hoping to see some improvement.    Back pain/Sciatica- Having back pain, tried gabapentin in the past at a low dose and wants to see if a higher dose will help.   CHF- currently on lasix, no worsening edema or fluid retention. Followed by cardiologist.   Hypothyroidism- ongoing, continues taking levothyroxine.  HTN- ongoing, today BP 118/72. Taking metoprolol and valsartan  Overactive bladder- on myrbetriq, seems to help a little but difficult to tell due to patient have no motivation to get up to use bathroom. Usually waits until last minute and becomes incontinent.   GERD- controlled on protonix   Review of Systems:  Review of Systems  Constitutional: Negative for chills, fever and weight loss.  HENT: Negative for tinnitus.   Respiratory: Negative for cough, sputum production and shortness of breath.   Cardiovascular: Negative for chest pain, palpitations and leg swelling.  Gastrointestinal: Negative for abdominal pain, constipation, nausea and vomiting.  Genitourinary: Negative for dysuria, frequency and urgency.  Musculoskeletal: Negative for back pain, falls and myalgias.  Skin: Negative.   Neurological: Negative for dizziness, weakness and headaches.  Psychiatric/Behavioral: Positive for depression (loss of interest) and memory loss (worsening). The patient does not have insomnia.     Past Medical History:  Diagnosis Date  .  AMI 11/17/2008   Qualifier: Diagnosis of  By: Marland Mcalpine    . Anxiety   . Atherosclerotic peripheral vascular disease ([place])   . Bacteremia, escherichia coli 04/27/2015  . Brachial-basilar  insufficiency syndrome   . CAD (coronary artery disease)    followed by dr [name].  . Celiac artery stenosis ([place])   . CHF (congestive heart failure) ([place]) 11/14/2017  . Chronic bilateral low back pain without sciatica   . Cognitive impairment   . Colon polyps   . Community acquired pneumonia   . Depression   . Diverticulosis   . Dysuria   . Encephalomalacia   . GAD (generalized anxiety disorder)   . GERD (gastroesophageal reflux disease)   . Glaucoma   . Hearing loss   . Hemorrhoids   . Hypertension   . Hypothyroidism   . Incontinence   . Mixed hyperlipidemia   . Myocardial infarction ([place])   . OSA on CPAP   . Peripheral arterial disease ([place])   . Pneumonitis 04/26/2015  . Pyelonephritis   . Pyelonephritis due to Escherichia coli 04/28/2015  . Sepsis ([place])   . Sinus drainage    took z-pack   finished yesterday  . Spondylosis of lumbar region without myelopathy or radiculopathy   . 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[place])   . Urticaria   . Vertigo, benign positional    Past Surgical History:  Procedure Laterality Date  . ABDOMINAL AORTOGRAM W/LOWER EXTREMITY N/A 08/22/2017   Procedure: ABDOMINAL AORTOGRAM W/LOWER EXTREMITY;  Surgeon: Serafina Mitchell, MD;  Location: [place] CV LAB;  Service: Cardiovascular;  Laterality: N/A;  . BILATERAL UPPER EXTREMITY ANGIOGRAM N/A 09/11/2012   Procedure: BILATERAL UPPER EXTREMITY ANGIOGRAM;  Surgeon: Serafina Mitchell, MD;  [facility] C[facility] LAB;  Service: Cardiovascular;  Laterality: N/A;  . BIOPSY  03/25/2020   Procedure: BIOPSY;  Surgeon: Irving Copas., MD;  Location: [place];  Service: Gastroenterology;;  . carotid duplex doppler Bilateral 09/03/2012, 11/03/2011   Evidence of 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 patent left common carotid- subclavian artery bypass with turbulent  flow noted at the anastomosis with velocities of 295 cm/s  . CAROTID-SUBCLAVIAN BYPASS GRAFT  12/15/2011   Procedure: BYPASS GRAFT CAROTID-SUBCLAVIAN;  Surgeon: Serafina Mitchell, MD;  Location: [facility] OR;  Service: Vascular;  Laterality: Left;  Left Carotid subclavian bypass  . CORONARY ANGIOPLASTY    . CORONARY ARTERY BYPASS GRAFT    . CORONARY ATHERECTOMY N/A 03/19/2020   Procedure: CORONARY ATHERECTOMY;  Surgeon: Jettie Booze, MD;  Location: [place] CV LAB;  Service: Cardiovascular;  Laterality: N/A;  . CORONARY STENT INTERVENTION N/A 03/19/2020   Procedure: CORONARY STENT INTERVENTION;  Surgeon: Jettie Booze, MD;  Location: [place] CV LAB;  Service: Cardiovascular;  Laterality: N/A;  . DOPPLER ECHOCARDIOGRAPHY  05/27/2010, 09/17/2008   Mild Proximal septal thickening is noted. Left ventricular systolic functions is normal ejection fraction =>55%. the aortic valve appears to be mildly sclerotic   . ESOPHAGOGASTRODUODENOSCOPY (EGD) WITH PROPOFOL N/A 03/25/2020   Procedure: ESOPHAGOGASTRODUODENOSCOPY (EGD) WITH PROPOFOL;  Surgeon: Rush Landmark Telford Nab., MD;  Location: [place];  Service: Gastroenterology;  Laterality: N/A;  . fem-fem bypass graft  1999  . holter monitor  01/21/2008   The predominant rhythm was normal sinus rhythm. Minimum heartrate of 63 bpm at +01:00, maximum heartrate of 105 bpm at + 10:35; and the average heartrate of  75 bpm. Ventricular ectopic activity totaled 1270: Multifocal; 866-PVC's and 404-VEs             . JOINT REPLACEMENT     Left knee  . LEFT HEART CATH AND CORS/GRAFTS ANGIOGRAPHY N/A 03/19/2020   Procedure: LEFT HEART CATH AND CORS/GRAFTS ANGIOGRAPHY;  Surgeon: Jettie Booze, MD;  Location: [place] CV LAB;  Service: Cardiovascular;  Laterality: N/A;  . LEFT HEART CATHETERIZATION WITH CORONARY/GRAFT ANGIOGRAM N/A 12/21/2011   Procedure: LEFT HEART CATHETERIZATION WITH Beatrix Fetters;  Surgeon: Leonie Man, MD;  Location:  [facility] CATH LAB;  Service: Cardiovascular;  Laterality: N/A;  . NM MYOCAR PERF EJECTION FRACTION  09/22/2009, 07/03/2007   the post stress myocardial perfusion images show a normal pattern of perfusion is all regions. The post-stress ejection fraction is 68 %. no significant wall motion abnormalities noted. This is a low risk scan.  Marland Kitchen PERIPHERAL VASCULAR INTERVENTION  08/22/2017   Procedure: PERIPHERAL VASCULAR INTERVENTION;  Surgeon: Serafina Mitchell, MD;  Location: [place] CV LAB;  Service: Cardiovascular;;  Fem/Fem Graft  . REPLACEMENT TOTAL KNEE  05-2011  . UNILATERAL UPPER EXTREMEITY ANGIOGRAM Left 11/15/2011   Procedure: UNILATERAL UPPER EXTREMEITY ANGIOGRAM;  Surgeon: Lorretta Harp, MD;  Location[facility] CATH LAB;  Service: Cardiovascular;  Laterality: Left;   Social History:   reports that she quit smoking about 38 years ago. Her smoking use included cigarettes. She quit after 3.00 years of use. She has never used smokeless tobacco. She reports previous alcohol use. She reports that she does not use drugs.  Family History  Problem Relation Age of Onset  . Heart attack Mother   . Heart disease Mother        before age 65  . Diabetes Father   . Heart disease Father   . Hypertension Father   . Hyperlipidemia Father   . Heart attack Father 30  . Heart attack Brother 110  . Cerebral palsy Sister 45  . Congestive Heart Failure Sister 66  . Heart attack Sister 46  . Hypertension Sister 57  . Dementia Sister   . Anxiety disorder Sister   . Anxiety disorder Sister 31  . Heart Problems Sister   . Stroke Sister   . Heart Problems Sister 23  . Heart attack Sister 41  . Colon cancer Brother 61  . Prostate cancer Brother 77  . Hypertension Daughter   . Irritable bowel syndrome Daughter   . Depression Daughter   . Anxiety disorder Daughter     Medications: Patient's Medications  New Prescriptions   No medications on file  Previous Medications   ACETAMINOPHEN (TYLENOL) 325 MG TABLET     Take 325 mg by mouth in the morning and at bedtime. 1 by mouth in the morning and 1 by mouth in the pm.   ALPRAZOLAM (XANAX) 0.5 MG TABLET    Take 1 tablet (0.5 mg total) by mouth daily as needed for anxiety.   ASPIRIN EC 81 MG TABLET    Take 81 mg by mouth at bedtime.    CALCIUM CARBONATE (TUMS - DOSED IN MG ELEMENTAL CALCIUM) 500 MG CHEWABLE TABLET    Chew 1 tablet by mouth daily as needed for indigestion or heartburn.   CHOLECALCIFEROL (VITAMIN D) 400 UNITS TABS TABLET    Take 400 Units by mouth daily.   CLOPIDOGREL (PLAVIX) 75 MG TABLET    TAKE 1 TABLET (75 MG TOTAL) BY MOUTH DAILY.   DAPAGLIFLOZIN PROPANEDIOL (FARXIGA) 10 MG TABS TABLET  Take 1 tablet (10 mg total) by mouth daily.   DIVALPROEX (DEPAKOTE ER) 250 MG 24 HR TABLET    Take 1 tablet (250 mg total) by mouth at bedtime.   DORZOLAMIDE (TRUSOPT) 2 % OPHTHALMIC SOLUTION    Place 1 drop into both eyes 2 (two) times daily.   ESCITALOPRAM (LEXAPRO) 20 MG TABLET    Take 1 tablet (20 mg total) by mouth daily.   FUROSEMIDE (LASIX) 40 MG TABLET    Take 1 tablet (40 mg total) by mouth daily.   LATANOPROST (XALATAN) 0.005 % OPHTHALMIC SOLUTION    Place 1 drop into both eyes at bedtime.   LEVOTHYROXINE (SYNTHROID) 100 MCG TABLET    TAKE 1 TABLET BY MOUTH DAILY BEFORE BREAKFAST WITH 88 MCG   LEVOTHYROXINE (SYNTHROID) 88 MCG TABLET    TAKE 1 TABLET BY MOUTH DAILY BEFORE BREAKFAST, TAKE WITH 100 MCG TABLET TO EQUAL 188 MCG TOTAL   MECLIZINE (ANTIVERT) 25 MG TABLET    Take 1 tablet (25 mg total) by mouth as needed for dizziness.   METOPROLOL SUCCINATE (TOPROL-XL) 25 MG 24 HR TABLET    Take 1 tablet (25 mg total) by mouth daily.   MIRABEGRON ER (MYRBETRIQ) 50 MG TB24 TABLET    Take 50 mg by mouth daily.   NITROGLYCERIN (NITROSTAT) 0.4 MG SL TABLET    Place 1 tablet (0.4 mg total) under the tongue every 5 (five) minutes x 3 doses as needed for chest pain.   PANTOPRAZOLE (PROTONIX) 40 MG TABLET    TAKE 1 TABLET EVERY DAY   ROSUVASTATIN (CRESTOR) 40  MG TABLET    Take 1 tablet (40 mg total) by mouth daily.   TIZANIDINE (ZANAFLEX) 2 MG TABLET    Take 1 tablet (2 mg total) by mouth every 6 (six) hours as needed for muscle spasms.   VALSARTAN (DIOVAN) 80 MG TABLET    TAKE 1 TABLET EVERY MORNING   VITAMIN B-12 (CYANOCOBALAMIN) 1000 MCG TABLET    Take 1,000 mcg by mouth daily.  Modified Medications   No medications on file  Discontinued Medications   BRIMONIDINE (ALPHAGAN) 0.2 % OPHTHALMIC SOLUTION    Place 1 drop into both eyes 2 (two) times daily.    Physical Exam:  Vitals:   09/02/20 1345  BP: 118/72  Pulse: 68  Temp: (!) 96.9 F (36.1 C)  TempSrc: Temporal  SpO2: 98%  Weight: 190 lb (86.2 kg)  Height: 5\' 1"  (1.549 m)   Body mass index is 35.9 kg/m. Wt Readings from Last 3 Encounters:  09/02/20 190 lb (86.2 kg)  07/15/20 195 lb 12.8 oz (88.8 kg)  05/06/20 205 lb 3.2 oz (93.1 kg)    Physical Exam Constitutional:      General: She is not in acute distress.    Appearance: Normal appearance. She is not diaphoretic.  HENT:     Head: Normocephalic and atraumatic.     Mouth/Throat:     Pharynx: No oropharyngeal exudate.  Eyes:     Conjunctiva/sclera: Conjunctivae normal.     Pupils: Pupils are equal, round, and reactive to light.  Cardiovascular:     Rate and Rhythm: Normal rate and regular rhythm.     Heart sounds: Normal heart sounds.  Pulmonary:     Effort: Pulmonary effort is normal.     Breath sounds: Normal breath sounds.  Abdominal:     General: Bowel sounds are normal.     Palpations: Abdomen is soft.  Musculoskeletal:  General: No swelling or tenderness. Normal range of motion.  Skin:    General: Skin is warm and dry.  Neurological:     Mental Status: She is alert and oriented to person, place, and time.    Labs reviewed: Basic Metabolic Panel: Recent Labs    12/11/19 1055 03/18/20 1537 03/18/20 1918 03/19/20 0343 03/25/20 1405 03/26/20 0500 03/28/20 1802 03/29/20 0405 03/30/20 0730  03/31/20 0704 04/01/20 0839 04/22/20 1118  NA 139   < >  --    < > 138   < > 134* 135  --  138 135 144  K 4.5   < >  --    < > 4.3   < > 3.9 3.7  --  4.2 4.4 4.1  CL 100   < >  --    < > 95*   < > 91* 90*  --  94* 95* 98  CO2 28   < >  --    < > 32   < > 30 32  --  31 30 30*  GLUCOSE 110*   < >  --    < > 108*   < > 146* 114*  --  123* 139* 99  BUN 16   < >  --    < > 24*   < > 36* 35*  --  32* 29* 25  CREATININE 1.25*   < >  --    < > 1.38*   < > 1.83* 1.64*  --  1.52* 1.46* 1.57*  CALCIUM 9.5   < >  --    < > 9.4   < > 9.3 9.3  --  9.6 9.3 9.0  MG  --    < > 1.8  --  2.2   < > 2.5* 2.4 2.5*  --   --   --   PHOS  --   --   --   --  4.5  --   --   --   --   --   --   --   TSH 8.24*  --  0.394  --   --   --   --   --   --   --   --   --    < > = values in this interval not displayed.   Liver Function Tests: Recent Labs    12/11/19 1055 03/18/20 1918 03/23/20 1155  AST 15 28 15   ALT 10 9 8   ALKPHOS  --  50 47  BILITOT 0.9 1.4* 0.9  PROT 6.5 5.9* 5.4*  ALBUMIN  --  3.5 2.6*   Recent Labs    03/23/20 1155  LIPASE 24  AMYLASE 19*   No results for input(s): AMMONIA in the last 8760 hours. CBC: Recent Labs    03/31/20 0704 04/01/20 0839 04/22/20 1118  WBC 6.2 6.4 5.7  HGB 10.5* 10.3* 10.2*  HCT 33.3* 32.7* 30.6*  MCV 86.5 87.4 86  PLT 248 231 108*   Lipid Panel: Recent Labs    12/11/19 1055 03/19/20 0343  CHOL 143 90  HDL 37* 26*  LDLCALC 74 30  TRIG 229* 172*  CHOLHDL 3.9 3.5   TSH: Recent Labs    12/11/19 1055 03/18/20 1918  TSH 8.24* 0.394   A1C: Lab Results  Component Value Date   HGBA1C 5.8 (H) 03/18/2020     Assessment/Plan 1. Chronic right-sided low back pain with right-sided sciatica Ongoing, no change since last  visit, previously on low dose gabapentin but will try an increased dose. Educated pt and daughter to follow up if gabapentin is causing too much sedation.also consider changing lexapro to Cymbalta daughter will discuss this with  psychiatrist  - gabapentin (NEURONTIN) 100 MG capsule; Take 2 capsules (200 mg total) by mouth daily.  Dispense: 60 capsule; Refill: 1  2. Hyperlipidemia LDL goal <70 Continues on crestor. Continue on heart healthy diet.   3. Acquired hypothyroidism Continues on levothyroxine, 152mcg daily, will check TSH    4. Chronic congestive heart failure, unspecified heart failure type (HCC) Euvolemic, taking lasix daily, no swelling or fluid retention. Pt daughter helping provide low sodium diet.  5. Essential hypertension Controlled on metoprolol and valsartan, checks BP at home. Continue current regimen with dietary modifications.  - CBC with Differential/Platelet - COMPLETE METABOLIC PANEL WITH GFR  6. Overactive bladder Ongoing, hard to evaluate with current loss of interest. Continue on myrbetriq, encouraged to follow a routine to get up from recliner every hour and walk to bathroom. Keep pads clean and dry.  7. Dementia with behavioral disturbance, unspecified dementia type ([place]) Progressive decline in memory, loss of interest in all activities of daily living. Gave daughter resources for local programs that can help assist in taking care of pt. Encouraged pt to begin training herself by getting up every hour or so, walking to bathroom and kitchen. educated daughter to continue to encourage pt to do things for herself to maintain function.  - AMB Referral to [place] [facility]  8. Gastroesophageal reflux disease without esophagitis Well controlled on protonix, no issues with abdominal pain, nausea or vomiting.   Next appt: 3 months Kamesha Herne K. Neale Marzette, [facility] [phone]   I personally was present during the history, physical exam and medical decision-making activities of this service and have verified that the service and findings are accurately documented in the student's note

## 2020-09-03 ENCOUNTER — Other Ambulatory Visit: Payer: Self-pay

## 2020-09-03 LAB — LIPID PANEL
Cholesterol: 135 mg/dL (ref <200)
HDL: 34 mg/dL — ABNORMAL LOW (ref >=50)
LDL Cholesterol (Calc): 70 mg/dL (calc)
Non-HDL Cholesterol (Calc): 101 mg/dL (calc) (ref <130)
Total CHOL/HDL Ratio: 4 (calc) (ref <5.0)
Triglycerides: 225 mg/dL — ABNORMAL HIGH (ref <150)

## 2020-09-03 LAB — TSH: TSH: 0.14 mIU/L — ABNORMAL LOW (ref 0.40–4.50)

## 2020-09-03 NOTE — Patient Outreach (Signed)
[place] [facility]) Care Management  09/03/2020  Deanna [name] 1940/12/15 [id number]   Referral Date: 09/02/20 Referral Source: MD referral Referral Reason: Advanced Dementia, increase supervision, and daughter needs help with resources.     Outreach Attempt: No answer.  HIPAA compliant voice message left.     Plan: RN CM will attempt again within 4 business days and send letter.   [name], RN, MSN [facility] Care Management Care Management Coordinator Direct Line [phone] Toll Free: [phone]  Fax: [phone]

## 2020-09-04 ENCOUNTER — Other Ambulatory Visit: Payer: Self-pay

## 2020-09-04 DIAGNOSIS — E039 Hypothyroidism, unspecified: Secondary | ICD-10-CM

## 2020-09-04 MED ORDER — LEVOTHYROXINE SODIUM 75 MCG PO TABS: 75.0000 ug | ORAL_TABLET | Freq: Every day | ORAL | 1 refills | Status: DC

## 2020-09-04 NOTE — Patient Outreach (Signed)
[place] [facility]) Care Management  09/04/2020  KASSIDY DOCKENDORF July 21, 1941 [id number]   Referral Date: 09/02/20 Referral Source: MD referral Referral Reason: Advanced Dementia, increase supervision, and daughter needs help with resources.     Outreach Attempt: No answer.  HIPAA compliant voice message left.     Plan: RN CM will attempt again within 4 business days.  [name], RN, MSN [place] [facility] Care Management Coordinator Direct Line [phone] Cell [phone] Toll Free: [phone]  Fax: [phone]

## 2020-09-07 ENCOUNTER — Other Ambulatory Visit: Payer: Self-pay

## 2020-09-07 NOTE — Patient Outreach (Signed)
[place] [facility]) Care Management  09/07/2020  Deanna [name] [birth year] [id number]   Referral Date:09/02/20 Referral Source:MD referral Referral Reason:Advanced Dementia, increase supervision, and daughter needs help with resources.   Outreach Attempt:No answer. HIPAA compliant voice message left.    Plan:RN CM will attempt again in the next 3-4 weeks.   [name], RN, MSN [place] [facility] Care Management Coordinator Direct Line [phone] Cell [phone] Toll Free: [phone]  Fax: [phone]

## 2020-09-08 ENCOUNTER — Telehealth: Payer: Self-pay

## 2020-09-08 NOTE — Telephone Encounter (Signed)
Left message on voicemail for patient to return call when available, reason for call patient needs to schedule a fasting lab appointment to draw missed labs at last appointment due to a system error (labs did not cross over to [place] only drew what was on the paper that patient handed her)   Labs have now crossed over and no new order needs to be placed  Labs were not able to be added as blood is now discarded after 5 days (in the past it was 7)  Patient will not be changed a venipuncture draw per Hassan Rowan, Margit Banda lab tech.

## 2020-09-08 NOTE — Telephone Encounter (Signed)
-----   Message from Lauree Chandler, NP sent at 09/08/2020  1:21 PM EST ----- She had blood work done but no results for CMP and CBC ----- Message ----- From: SYSTEM Sent: 09/07/2020  12:09 AM EST To: Lauree Chandler, NP

## 2020-09-10 NOTE — Telephone Encounter (Signed)
Left message on voicemail for patients daughter [name]  to return call when available

## 2020-09-11 NOTE — Telephone Encounter (Signed)
Left message on voicemail for patients daughter [name] to return call when available.

## 2020-09-14 NOTE — Telephone Encounter (Signed)
Left message on voicemail for patients daughter [name] to return call when available

## 2020-09-15 MED ORDER — DAPAGLIFLOZIN PROPANEDIOL 10 MG PO TABS: 10.0000 mg | ORAL_TABLET | Freq: Every day | ORAL | 5 refills | Status: DC

## 2020-09-15 NOTE — Telephone Encounter (Signed)
RX for levothyroxine 75 mcg was already sent by [facility] CMA.  I will forward message to Lauree Chandler, NP to confirm ok to refill [facility] as it was last prescribed by a different provider   Please advise

## 2020-09-17 ENCOUNTER — Other Ambulatory Visit: Payer: Self-pay | Admitting: Nurse Practitioner

## 2020-09-17 DIAGNOSIS — K219 Gastro-esophageal reflux disease without esophagitis: Secondary | ICD-10-CM

## 2020-10-01 ENCOUNTER — Other Ambulatory Visit: Payer: Self-pay | Admitting: Nurse Practitioner

## 2020-10-01 DIAGNOSIS — E785 Hyperlipidemia, unspecified: Secondary | ICD-10-CM

## 2020-10-02 ENCOUNTER — Other Ambulatory Visit: Payer: Self-pay

## 2020-10-02 NOTE — Patient Outreach (Signed)
[place] [facility]) Care Management  10/02/2020  MIRIAM LILES [birth year] [id number]   Referral Date:09/02/20 Referral Source:MD referral Referral Reason:Advanced Dementia, increase supervision, and daughter needs help with resources.   Outreach Attempt:No answer. HIPAA compliant voice message left.    Plan:RN CM will close case.    [name], RN, MSN [place] [facility] Care Management Coordinator Direct Line [phone] Cell [phone] Toll Free: [phone]  Fax: [phone]

## 2020-10-16 ENCOUNTER — Other Ambulatory Visit: Payer: Self-pay | Admitting: Nurse Practitioner

## 2020-10-28 ENCOUNTER — Other Ambulatory Visit: Payer: Self-pay | Admitting: Nurse Practitioner

## 2020-10-28 DIAGNOSIS — G8929 Other chronic pain: Secondary | ICD-10-CM

## 2020-10-28 DIAGNOSIS — M5441 Lumbago with sciatica, right side: Secondary | ICD-10-CM

## 2020-11-03 ENCOUNTER — Other Ambulatory Visit: Payer: Self-pay | Admitting: Nurse Practitioner

## 2020-11-03 DIAGNOSIS — E039 Hypothyroidism, unspecified: Secondary | ICD-10-CM

## 2020-11-04 NOTE — Telephone Encounter (Signed)
Pharmacy requested refill. Pended Rx and sent to Jessica for approval due to HIGH ALERT Warning.  

## 2020-11-12 DIAGNOSIS — R69 Illness, unspecified: Secondary | ICD-10-CM | POA: Diagnosis not present

## 2020-11-25 ENCOUNTER — Ambulatory Visit (INDEPENDENT_AMBULATORY_CARE_PROVIDER_SITE_OTHER): Payer: Medicare HMO | Admitting: Psychiatry

## 2020-11-25 ENCOUNTER — Encounter: Payer: Self-pay | Admitting: Psychiatry

## 2020-11-25 ENCOUNTER — Other Ambulatory Visit: Payer: Self-pay

## 2020-11-25 DIAGNOSIS — F39 Unspecified mood [affective] disorder: Secondary | ICD-10-CM

## 2020-11-25 DIAGNOSIS — F411 Generalized anxiety disorder: Secondary | ICD-10-CM | POA: Diagnosis not present

## 2020-11-25 MED ORDER — DIVALPROEX SODIUM ER 250 MG PO TB24: 250.0000 mg | ORAL_TABLET | Freq: Every day | ORAL | 1 refills | Status: DC

## 2020-11-25 MED ORDER — ALPRAZOLAM 0.5 MG PO TABS: 0.5000 mg | ORAL_TABLET | Freq: Every day | ORAL | 0 refills | Status: DC | PRN

## 2020-11-25 MED ORDER — ESCITALOPRAM OXALATE 20 MG PO TABS: 20.0000 mg | ORAL_TABLET | Freq: Every day | ORAL | 1 refills | Status: DC

## 2020-11-25 NOTE — Progress Notes (Signed)
JOLIET MALLOZZI [id number] Mar 18, 1941 80 y.o.  Subjective:   Patient ID:  Deanna [name] is a 80 y.o. (DOB [birth year]) female.  Chief Complaint:  Chief Complaint  Patient presents with  . Follow-up    History of mood disturbance and anxiety    HPI RUPA LAGAN presents to the office today for follow-up of mood s/s and anxiety. She is accompanied by her daughter. Pt reports that she is doing well. Daughter reports, "when she is alert, she is more alert." Daughter reports that pt is sleeping 12-13 hours a night and some during the day. Daughter reports that pt has been gradually sleeping more over time. Daughter reports that pt has been "happy" for the most part. Pt denies depressed mood. Pt and daughter report that pt has not been as irritable. She reports that she has been feeling "calm." She reports, "I am finding more enjoyment out of life." They report that her energy has been low. Daughter reports that pt will fold laundry if she brings it to her. Daughter reports that pt does not offer to help with other chores, such as cooking, clean up, dishes, etc. Daughter reports that pt's motivation is low. Pt reports that her motivation is low at times. Appetite has been good. She reports that her concentration is adequate. Pt has vision impairment limites her ability to read.  She reports that she can watch TV and follow the story, however daughter reports that pt cannot remember characters' name from soap opera she has watched for years. Denies SI.   She reports that she has difficulty recalling appointments and other information.   Depakote ER dose decreased to 250 mg at bedtime only at time of last visit.  Pt reports that tremor may be improved and daughter denies any worsening of tremor. They deny any improvement in energy and motivation.   Has been taking Xanax prn on rare occasions.  Past Psychiatric Medication Trials: Sertraline-initially helpful and then no longer  effective Cymbalta-increased blood pressure Prozac-possible adverse reaction Lexapro Depakote Ativan-effective but caused excessive drowsiness Xanax-effective Aricept-anger Namenda-anger Gabapentin- limited improvement unless she takes higher doses that cause somnolence.  Mini-Mental   Flowsheet Row Office Visit from 03/07/2018 in [facility] Visit from 05/30/2014 in [facility]  Total Score (max 30 points ) 29 29    PHQ2-9   [place] Visit from 05/06/2020 in [place] Visit from 11/29/2017 in [facility] Patient Outreach from 11/17/2015 in [place] Patient Outreach from 10/15/2015 in [facility] Patient Outreach from 09/16/2015 in [place]  PHQ-2 Total Score 0 6 1 1 1   PHQ-9 Total Score -- 18 -- -- --       Review of Systems:  Review of Systems  Eyes: Positive for visual disturbance.  Musculoskeletal: Negative for gait problem.  Neurological: Positive for tremors.  Psychiatric/Behavioral:       Please refer to HPI    Medications: I have reviewed the patient's current medications.  Current Outpatient Medications  Medication Sig Dispense Refill  . acetaminophen (TYLENOL) 325 MG tablet Take 325 mg by mouth in the morning and at bedtime. 1 by mouth in the morning and 1 by mouth in the pm.    . aspirin EC 81 MG tablet Take 81 mg by mouth at bedtime.     . cholecalciferol (VITAMIN D) 400 units TABS tablet Take 400 Units by mouth daily.    . clopidogrel (PLAVIX) 75 MG  tablet Take 1 tablet (75 mg total) by mouth daily. 90 tablet 1  . dapagliflozin propanediol (FARXIGA) 10 MG TABS tablet Take 1 tablet (10 mg total) by mouth daily. 30 tablet 5  . dorzolamide (TRUSOPT) 2 % ophthalmic solution Place 1 drop into both eyes 2 (two) times daily.    . furosemide (LASIX) 40 MG tablet Take 1 tablet (40 mg total) by mouth daily. 30 tablet 2  . gabapentin (NEURONTIN) 100 MG  capsule TAKE 2 CAPSULES(200 MG) BY MOUTH DAILY 60 capsule 1  . latanoprost (XALATAN) 0.005 % ophthalmic solution Place 1 drop into both eyes at bedtime.    Marland Kitchen levothyroxine (SYNTHROID) 100 MCG tablet Take 1 tablet (100 mcg total) by mouth daily before breakfast. Take with 75 mcg to equal 175 mcg 90 tablet 0  . levothyroxine (SYNTHROID) 75 MCG tablet Take 1 tablet (75 mcg total) by mouth daily. 90 tablet 1  . meclizine (ANTIVERT) 25 MG tablet Take 1 tablet (25 mg total) by mouth as needed for dizziness. 30 tablet 0  . metoprolol succinate (TOPROL-XL) 25 MG 24 hr tablet Take 1 tablet (25 mg total) by mouth daily. 60 tablet 3  . mirabegron ER (MYRBETRIQ) 50 MG TB24 tablet Take 50 mg by mouth daily.    . pantoprazole (PROTONIX) 40 MG tablet TAKE 1 TABLET EVERY DAY 90 tablet 1  . rosuvastatin (CRESTOR) 40 MG tablet TAKE 1 TABLET(40 MG) BY MOUTH DAILY 90 tablet 3  . tiZANidine (ZANAFLEX) 2 MG tablet Take 1 tablet (2 mg total) by mouth every 6 (six) hours as needed for muscle spasms. 30 tablet 5  . valsartan (DIOVAN) 80 MG tablet TAKE 1 TABLET EVERY MORNING 90 tablet 1  . vitamin B-12 (CYANOCOBALAMIN) 1000 MCG tablet Take 1,000 mcg by mouth daily.    Marland Kitchen ALPRAZolam (XANAX) 0.5 MG tablet Take 1 tablet (0.5 mg total) by mouth daily as needed for anxiety. 30 tablet 0  . divalproex (DEPAKOTE ER) 250 MG 24 hr tablet Take 1 tablet (250 mg total) by mouth at bedtime. 90 tablet 1  . escitalopram (LEXAPRO) 20 MG tablet Take 1 tablet (20 mg total) by mouth daily. 90 tablet 1  . nitroGLYCERIN (NITROSTAT) 0.4 MG SL tablet Place 1 tablet (0.4 mg total) under the tongue every 5 (five) minutes x 3 doses as needed for chest pain. 25 tablet 1   No current facility-administered medications for this visit.    Medication Side Effects: Other: Tremor  Allergies:  Allergies  Allergen Reactions  . Donepezil Other (See Comments)    Altered mood, aggression, and caused anger  . Duloxetine Hcl     Increased confusion and  memory concerns   . Meloxicam     Pt suspects that this medicine causes fluid on her lungs.   . Memantine Other (See Comments)    Caused severe aggression  . 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 Vicodin [Hydrocodone-Acetaminophen] Other (See Comments)    Delirium, confusion, and toxic dementia    Past Medical History:  Diagnosis Date  . AMI 11/17/2008   Qualifier: Diagnosis of  By: Marland Mcalpine    . Anxiety   . Atherosclerotic peripheral vascular disease ([place])   . Bacteremia, escherichia coli 04/27/2015  . Brachial-basilar insufficiency syndrome   . CAD (coronary artery disease)    followed by dr [name].  . Celiac artery stenosis ([place])   . CHF (congestive heart failure) ([place]) 11/14/2017  .  Chronic bilateral low back pain without sciatica   . Cognitive impairment   . Colon polyps   . Community acquired pneumonia   . Depression   . Diverticulosis   . Dysuria   . Encephalomalacia   . GAD (generalized anxiety disorder)   . GERD (gastroesophageal reflux disease)   . Glaucoma   . Hearing loss   . Hemorrhoids   . Hypertension   . Hypothyroidism   . Incontinence   . Mixed hyperlipidemia   . Myocardial infarction ([place])   . OSA on CPAP   . Peripheral arterial disease ([place])   . Pneumonitis 04/26/2015  . Pyelonephritis   . Pyelonephritis due to Escherichia coli 04/28/2015  . Sepsis ([place])   . Sinus drainage    took z-pack   finished yesterday  . Spondylosis of lumbar region without myelopathy or radiculopathy   . 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[place])   . Urticaria   . Vertigo, benign positional     Past Medical History, Surgical history, Social history, and Family history were reviewed and updated as appropriate.   Please see review of systems for further details on the patient's review from today.   Objective:   Physical Exam:  Pulse 81   Wt 195 lb (88.5 kg)   BMI 36.84 kg/m   Physical  Exam Constitutional:      General: She is not in acute distress. Musculoskeletal:        General: No deformity.  Neurological:     Mental Status: She is alert and oriented to person, place, and time.     Coordination: Coordination normal.  Psychiatric:        Attention and Perception: Attention and perception normal. She does not perceive auditory or visual hallucinations.        Mood and Affect: Mood normal. Mood is not anxious or depressed. Affect is not labile, blunt, angry or inappropriate.        Speech: Speech normal.        Behavior: Behavior normal.        Thought Content: Thought content normal. Thought content is not paranoid or delusional. Thought content does not include homicidal or suicidal ideation. Thought content does not include homicidal or suicidal plan.        Cognition and Memory: Cognition normal. She exhibits impaired recent memory.        Judgment: Judgment normal.     Comments: Insight intact     Lab Review:     Component Value Date/Time   NA 144 04/22/2020 1118   K 4.1 04/22/2020 1118   CL 98 04/22/2020 1118   CO2 30 (H) 04/22/2020 1118   GLUCOSE 99 04/22/2020 1118   GLUCOSE 139 (H) 04/01/2020 0839   BUN 25 04/22/2020 1118   CREATININE 1.57 (H) 04/22/2020 1118   CREATININE 1.25 (H) 12/11/2019 1055   CALCIUM 9.0 04/22/2020 1118   PROT 5.4 (L) 03/23/2020 1155   ALBUMIN 2.6 (L) 03/23/2020 1155   AST 15 03/23/2020 1155   ALT 8 03/23/2020 1155   ALKPHOS 47 03/23/2020 1155   BILITOT 0.9 03/23/2020 1155   GFRNONAA 31 (L) 04/22/2020 1118   GFRNONAA 41 (L) 12/11/2019 1055   GFRAA 36 (L) 04/22/2020 1118   GFRAA 48 (L) 12/11/2019 1055       Component Value Date/Time   WBC 5.7 04/22/2020 1118   WBC 6.4 04/01/2020 0839   RBC 3.56 (L) 04/22/2020 1118   RBC 3.74 (L) 04/01/2020 0839   HGB 10.2 (  L) 04/22/2020 1118   HCT 30.6 (L) 04/22/2020 1118   PLT 108 (L) 04/22/2020 1118   MCV 86 04/22/2020 1118   MCH 28.7 04/22/2020 1118   MCH 27.5 04/01/2020 0839    MCHC 33.3 04/22/2020 1118   MCHC 31.5 04/01/2020 0839   RDW 13.5 04/22/2020 1118   LYMPHSABS 3,349 08/16/2019 1354   MONOABS 0.5 04/27/2015 0310   EOSABS 34 08/16/2019 1354   BASOSABS 43 08/16/2019 1354    No results found for: POCLITH, LITHIUM   No results found for: PHENYTOIN, PHENOBARB, VALPROATE, CBMZ   .res Assessment: Plan:   Discussed option to continue low-dose Depakote ER or discontinue since she has not had any worsening s/s with decrease in dosage. Both patient and her daughter report that her mood and anxiety signs and symptoms have been very stable and they would prefer to continue current medications without changes.  Will continue Depakote ER 250 mg at bedtime for mood symptoms.  Continue Lexapro 20 mg daily for mood and anxiety.  Continue Xanax 0.5 mg po qd prn anxiety.  Patient to follow-up in 6 months or sooner if clinically indicated.  Patient advised to contact office with any questions, adverse effects, or acute worsening in signs and symptoms.  Shamia was seen today for follow-up.  Diagnoses and all orders for this visit:  Mood disorder ([place]) -     escitalopram (LEXAPRO) 20 MG tablet; Take 1 tablet (20 mg total) by mouth daily. -     divalproex (DEPAKOTE ER) 250 MG 24 hr tablet; Take 1 tablet (250 mg total) by mouth at bedtime.  Generalized anxiety disorder -     escitalopram (LEXAPRO) 20 MG tablet; Take 1 tablet (20 mg total) by mouth daily. -     ALPRAZolam (XANAX) 0.5 MG tablet; Take 1 tablet (0.5 mg total) by mouth daily as needed for anxiety.     Please see After Visit Summary for patient specific instructions.  Future Appointments  Date Time Provider [place]  12/02/2020  1:00 PM Lauree Chandler, NP [facility] None  05/26/2021 12:45 PM Thayer Headings, PMHNP [facility] None    No orders of the defined types were placed in this encounter.   -------------------------------

## 2020-12-02 ENCOUNTER — Ambulatory Visit: Payer: Medicare HMO | Admitting: Nurse Practitioner

## 2020-12-06 ENCOUNTER — Other Ambulatory Visit: Payer: Self-pay | Admitting: Nurse Practitioner

## 2020-12-06 DIAGNOSIS — I509 Heart failure, unspecified: Secondary | ICD-10-CM

## 2020-12-07 ENCOUNTER — Ambulatory Visit (INDEPENDENT_AMBULATORY_CARE_PROVIDER_SITE_OTHER): Payer: Medicare HMO | Admitting: Nurse Practitioner

## 2020-12-07 ENCOUNTER — Encounter: Payer: Self-pay | Admitting: Nurse Practitioner

## 2020-12-07 ENCOUNTER — Other Ambulatory Visit: Payer: Self-pay

## 2020-12-07 VITALS — BP 130/80 | HR 71 | Temp 97.1°F | Ht 61.0 in | Wt 194.0 lb

## 2020-12-07 DIAGNOSIS — I509 Heart failure, unspecified: Secondary | ICD-10-CM | POA: Diagnosis not present

## 2020-12-07 DIAGNOSIS — E039 Hypothyroidism, unspecified: Secondary | ICD-10-CM

## 2020-12-07 DIAGNOSIS — Z1159 Encounter for screening for other viral diseases: Secondary | ICD-10-CM | POA: Diagnosis not present

## 2020-12-07 DIAGNOSIS — N3281 Overactive bladder: Secondary | ICD-10-CM

## 2020-12-07 DIAGNOSIS — G8929 Other chronic pain: Secondary | ICD-10-CM

## 2020-12-07 DIAGNOSIS — I10 Essential (primary) hypertension: Secondary | ICD-10-CM | POA: Diagnosis not present

## 2020-12-07 DIAGNOSIS — I25708 Atherosclerosis of coronary artery bypass graft(s), unspecified, with other forms of angina pectoris: Secondary | ICD-10-CM | POA: Diagnosis not present

## 2020-12-07 DIAGNOSIS — D696 Thrombocytopenia, unspecified: Secondary | ICD-10-CM | POA: Diagnosis not present

## 2020-12-07 DIAGNOSIS — E785 Hyperlipidemia, unspecified: Secondary | ICD-10-CM

## 2020-12-07 DIAGNOSIS — M5441 Lumbago with sciatica, right side: Secondary | ICD-10-CM | POA: Diagnosis not present

## 2020-12-07 DIAGNOSIS — I110 Hypertensive heart disease with heart failure: Secondary | ICD-10-CM | POA: Diagnosis not present

## 2020-12-07 DIAGNOSIS — R269 Unspecified abnormalities of gait and mobility: Secondary | ICD-10-CM

## 2020-12-07 NOTE — Progress Notes (Signed)
Careteam: Patient Care Team: Lauree Chandler, NP as PCP - General (Geriatric Medicine) Lorretta Harp, MD as PCP - Cardiology (Cardiology) Irene Shipper, MD as Consulting Physician (Gastroenterology) Frederik Pear, MD as Consulting Physician (Orthopedic Surgery) Fontaine, Belinda Block, MD (Inactive) as Consulting Physician (Gynecology) Rutherford Guys, MD as Consulting Physician (Ophthalmology) Bjorn Loser, MD as Consulting Physician (Urology) Serafina Mitchell, MD as Consulting Physician (Vascular Surgery) Cameron Sprang, MD as Consulting Physician (Neurology) Desma Maxim, MD as Referring Physician (Ophthalmology) Thayer Headings, PMHNP as Nurse Practitioner (Psychiatry) Cameron Sprang, MD as Consulting Physician (Neurology)  PLACE OF SERVICE:  [facility]  Advanced Directive information    Allergies  Allergen Reactions  . Donepezil Other (See Comments)    Altered mood, aggression, and caused anger  . Duloxetine Hcl     Increased confusion and memory concerns   . Meloxicam     Pt suspects that this medicine causes fluid on her lungs.   . Memantine Other (See Comments)    Caused severe aggression  . 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 Vicodin [Hydrocodone-Acetaminophen] Other (See Comments)    Delirium, confusion, and toxic dementia    Chief Complaint  Patient presents with  . Medical Management of Chronic Issues    3 month follow up. Patient had fall about a month ago. Patient hit back when she fell and would like it looked at. Patient still has pain from fall. Patient is sleeping all day and daughter has concerns. Patient in office with daughter [name] today.Patient's mood has been fine, just increased sleeping.  Marland Kitchen Best Practice Recommendations    Hepatitis C screening and Tetanus/Tdap.     HPI: Patient is a 80 y.o. female for routine follow up   Got her feet tangled up and feel back and hit an  end table. Happened about a month ago and still hurting. Otherwise gait has been steady and stable. Reports she feels weak most of the time. Does not get up and do stuff during the day.  Gets up and walks to the bathroom and the kitchen.   Sleeping a lot- gets in chair sleeps then gets up to go to her room for a nap and then goes to bed for the evening.   htn- controlled on current regimen  A lots of lucid dreams, thinks someone is out of her window or at her door.  Chronic low back pain- taking gabapentin 1 tablet twice daily, feels like she is able to be more active with better control of pain.   chf- stable. Continues on metoprolol and lasix with farixga  Review of Systems:  Review of Systems  Constitutional: Positive for malaise/fatigue. Negative for chills, fever and weight loss.  HENT: Negative for tinnitus.   Respiratory: Negative for cough, sputum production and shortness of breath.   Cardiovascular: Negative for chest pain, palpitations and leg swelling.  Gastrointestinal: Negative for abdominal pain, constipation, diarrhea and heartburn.  Genitourinary: Positive for frequency. Negative for dysuria and urgency.  Musculoskeletal: Positive for back pain. Negative for falls, joint pain and myalgias.  Skin: Negative.   Neurological: Negative for dizziness and headaches.  Psychiatric/Behavioral: Positive for memory loss. Negative for depression. The patient does not have insomnia.     Past Medical History:  Diagnosis Date  . AMI 11/17/2008   Qualifier: Diagnosis of  By: Marland Mcalpine    . Anxiety   .  Atherosclerotic peripheral vascular disease ([place])   . Bacteremia, escherichia coli 04/27/2015  . Brachial-basilar insufficiency syndrome   . CAD (coronary artery disease)    followed by dr [name].  . Celiac artery stenosis ([place])   . CHF (congestive heart failure) ([place]) 11/14/2017  . Chronic bilateral low back pain without sciatica   . Cognitive impairment   . Colon polyps    . Community acquired pneumonia   . Depression   . Diverticulosis   . Dysuria   . Encephalomalacia   . GAD (generalized anxiety disorder)   . GERD (gastroesophageal reflux disease)   . Glaucoma   . Hearing loss   . Hemorrhoids   . Hypertension   . Hypothyroidism   . Incontinence   . Mixed hyperlipidemia   . Myocardial infarction ([place])   . OSA on CPAP   . Peripheral arterial disease ([place])   . Pneumonitis 04/26/2015  . Pyelonephritis   . Pyelonephritis due to Escherichia coli 04/28/2015  . Sepsis ([place])   . Sinus drainage    took z-pack   finished yesterday  . Spondylosis of lumbar region without myelopathy or radiculopathy   . 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[place])   . Urticaria   . Vertigo, benign positional    Past Surgical History:  Procedure Laterality Date  . ABDOMINAL AORTOGRAM W/LOWER EXTREMITY N/A 08/22/2017   Procedure: ABDOMINAL AORTOGRAM W/LOWER EXTREMITY;  Surgeon: Serafina Mitchell, MD;  Location: [facility];  Service: Cardiovascular;  Laterality: N/A;  . BILATERAL UPPER EXTREMITY ANGIOGRAM N/A 09/11/2012   Procedure: BILATERAL UPPER EXTREMITY ANGIOGRAM;  Surgeon: Serafina Mitchell, MD;  Location: [facility] CATH LAB;  Service: Cardiovascular;  Laterality: N/A;  . BIOPSY  03/25/2020   Procedure: BIOPSY;  Surgeon: Irving Copas., MD;  Location: [facility];  Service: Gastroenterology;;  . carotid duplex doppler Bilateral 09/03/2012, 11/03/2011   Evidence of 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 patent left common carotid- subclavian artery bypass with turbulent flow noted at the anastomosis with velocities of 295 cm/s  . CAROTID-SUBCLAVIAN BYPASS GRAFT  12/15/2011   Procedure: BYPASS GRAFT CAROTID-SUBCLAVIAN;  Surgeon: Serafina Mitchell, MD;  Location: [facility] OR;  Service: Vascular;  Laterality: Left;  Left Carotid subclavian bypass   . CORONARY ANGIOPLASTY    . CORONARY ARTERY BYPASS GRAFT    . CORONARY ATHERECTOMY N/A 03/19/2020   Procedure: CORONARY ATHERECTOMY;  Surgeon: Jettie Booze, MD;  Location: [facility];  Service: Cardiovascular;  Laterality: N/A;  . CORONARY STENT INTERVENTION N/A 03/19/2020   Procedure: CORONARY STENT INTERVENTION;  Surgeon: Jettie Booze, MD;  Location: [facility];  Service: Cardiovascular;  Laterality: N/A;  . DOPPLER ECHOCARDIOGRAPHY  05/27/2010, 09/17/2008   Mild Proximal septal thickening is noted. Left ventricular systolic functions is normal ejection fraction =>55%. the aortic valve appears to be mildly sclerotic   . ESOPHAGOGASTRODUODENOSCOPY (EGD) WITH PROPOFOL N/A 03/25/2020   Procedure: ESOPHAGOGASTRODUODENOSCOPY (EGD) WITH PROPOFOL;  Surgeon: Rush Landmark Telford Nab., MD;  Location: [facility];  Service: Gastroenterology;  Laterality: N/A;  . fem-fem bypass graft  1999  . holter monitor  01/21/2008   The predominant rhythm was normal sinus rhythm. Minimum heartrate of 63 bpm at +01:00, maximum heartrate of 105 bpm at + 10:35; and the average heartrate of 75 bpm. Ventricular ectopic activity totaled 1270: Multifocal; 866-PVC's and 404-VEs             .  JOINT REPLACEMENT     Left knee  . LEFT HEART CATH AND CORS/GRAFTS ANGIOGRAPHY N/A 03/19/2020   Procedure: LEFT HEART CATH AND CORS/GRAFTS ANGIOGRAPHY;  Surgeon: Jettie Booze, MD;  Location: [facility];  Service: Cardiovascular;  Laterality: N/A;  . LEFT HEART CATHETERIZATION WITH CORONARY/GRAFT ANGIOGRAM N/A 12/21/2011   Procedure: LEFT HEART CATHETERIZATION WITH Beatrix Fetters;  Surgeon: Leonie Man, MD;  Location[facility] CATH LAB;  Service: Cardiovascular;  Laterality: N/A;  . NM MYOCAR PERF EJECTION FRACTION  09/22/2009, 07/03/2007   the post stress myocardial perfusion images show a normal pattern of perfusion is all regions. The post-stress ejection fraction is 68 %. no significant  wall motion abnormalities noted. This is a low risk scan.  Marland Kitchen PERIPHERAL VASCULAR INTERVENTION  08/22/2017   Procedure: PERIPHERAL VASCULAR INTERVENTION;  Surgeon: Serafina Mitchell, MD;  Location: [facility];  Service: Cardiovascular;;  Fem/Fem Graft  . REPLACEMENT TOTAL KNEE  05-2011  . UNILATERAL UPPER EXTREMEITY ANGIOGRAM Left 11/15/2011   Procedure: UNILATERAL UPPER EXTREMEITY ANGIOGRAM;  Surgeon: Lorretta Harp, MD;  Location: [facility] CATH LAB;  Service: Cardiovascular;  Laterality: Left;   Social History:   reports that she quit smoking about 39 years ago. Her smoking use included cigarettes. She quit after 3.00 years of use. She has never used smokeless tobacco. She reports previous alcohol use. She reports that she does not use drugs.  Family History  Problem Relation Age of Onset  . Heart attack Mother   . Heart disease Mother        before age 83  . Diabetes Father   . Heart disease Father   . Hypertension Father   . Hyperlipidemia Father   . Heart attack Father 17  . Heart attack Brother 53  . Cerebral palsy Sister 69  . Congestive Heart Failure Sister 47  . Heart attack Sister 66  . Hypertension Sister 17  . Dementia Sister   . Anxiety disorder Sister   . Anxiety disorder Sister 69  . Heart Problems Sister   . Stroke Sister   . Heart Problems Sister 29  . Heart attack Sister 15  . Colon cancer Brother 32  . Prostate cancer Brother 32  . Hypertension Daughter   . Irritable bowel syndrome Daughter   . Depression Daughter   . Anxiety disorder Daughter     Medications: Patient's Medications  New Prescriptions   No medications on file  Previous Medications   ACETAMINOPHEN (TYLENOL) 325 MG TABLET    Take 325 mg by mouth in the morning and at bedtime. 1 by mouth in the morning and 1 by mouth in the pm.   ALPRAZOLAM (XANAX) 0.5 MG TABLET    Take 1 tablet (0.5 mg total) by mouth daily as needed for anxiety.   ASPIRIN EC 81 MG TABLET    Take 81 mg by mouth at  bedtime.    CHOLECALCIFEROL (VITAMIN D) 400 UNITS TABS TABLET    Take 400 Units by mouth daily.   CLOPIDOGREL (PLAVIX) 75 MG TABLET    Take 1 tablet (75 mg total) by mouth daily.   DAPAGLIFLOZIN PROPANEDIOL (FARXIGA) 10 MG TABS TABLET    Take 1 tablet (10 mg total) by mouth daily.   DIVALPROEX (DEPAKOTE ER) 250 MG 24 HR TABLET    Take 1 tablet (250 mg total) by mouth at bedtime.   DORZOLAMIDE (TRUSOPT) 2 % OPHTHALMIC SOLUTION    Place 1 drop into both eyes 2 (two)  times daily.   ESCITALOPRAM (LEXAPRO) 20 MG TABLET    Take 1 tablet (20 mg total) by mouth daily.   FUROSEMIDE (LASIX) 40 MG TABLET    TAKE 1 TABLET(40 MG) BY MOUTH DAILY   GABAPENTIN (NEURONTIN) 100 MG CAPSULE    TAKE 2 CAPSULES(200 MG) BY MOUTH DAILY   LATANOPROST (XALATAN) 0.005 % OPHTHALMIC SOLUTION    Place 1 drop into both eyes at bedtime.   LEVOTHYROXINE (SYNTHROID) 100 MCG TABLET    Take 1 tablet (100 mcg total) by mouth daily before breakfast. Take with 75 mcg to equal 175 mcg   LEVOTHYROXINE (SYNTHROID) 75 MCG TABLET    Take 1 tablet (75 mcg total) by mouth daily.   MECLIZINE (ANTIVERT) 25 MG TABLET    Take 1 tablet (25 mg total) by mouth as needed for dizziness.   METOPROLOL SUCCINATE (TOPROL-XL) 25 MG 24 HR TABLET    Take 1 tablet (25 mg total) by mouth daily.   MIRABEGRON ER (MYRBETRIQ) 50 MG TB24 TABLET    Take 50 mg by mouth daily.   NITROGLYCERIN (NITROSTAT) 0.4 MG SL TABLET    Place 1 tablet (0.4 mg total) under the tongue every 5 (five) minutes x 3 doses as needed for chest pain.   PANTOPRAZOLE (PROTONIX) 40 MG TABLET    TAKE 1 TABLET EVERY DAY   ROSUVASTATIN (CRESTOR) 40 MG TABLET    TAKE 1 TABLET(40 MG) BY MOUTH DAILY   TIZANIDINE (ZANAFLEX) 2 MG TABLET    Take 1 tablet (2 mg total) by mouth every 6 (six) hours as needed for muscle spasms.   VALSARTAN (DIOVAN) 80 MG TABLET    TAKE 1 TABLET EVERY MORNING   VITAMIN B-12 (CYANOCOBALAMIN) 1000 MCG TABLET    Take 1,000 mcg by mouth daily.  Modified Medications   No  medications on file  Discontinued Medications   No medications on file    Physical Exam:  Vitals:   12/07/20 1323  BP: 130/80  Pulse: 71  Temp: (!) 97.1 F (36.2 C)  TempSrc: Temporal  SpO2: 96%  Weight: 194 lb (88 kg)  Height: 5\' 1"  (1.549 m)   Body mass index is 36.66 kg/m. Wt Readings from Last 3 Encounters:  12/07/20 194 lb (88 kg)  09/02/20 190 lb (86.2 kg)  07/15/20 195 lb 12.8 oz (88.8 kg)    Physical Exam Constitutional:      General: She is not in acute distress.    Appearance: She is well-developed. She is not diaphoretic.  HENT:     Head: Normocephalic and atraumatic.     Mouth/Throat:     Pharynx: No oropharyngeal exudate.  Eyes:     Conjunctiva/sclera: Conjunctivae normal.     Pupils: Pupils are equal, round, and reactive to light.  Cardiovascular:     Rate and Rhythm: Normal rate and regular rhythm.     Heart sounds: Normal heart sounds.  Pulmonary:     Effort: Pulmonary effort is normal.     Breath sounds: Normal breath sounds.  Abdominal:     General: Bowel sounds are normal.     Palpations: Abdomen is soft.  Musculoskeletal:     Cervical back: Normal range of motion and neck supple.     Thoracic back: Tenderness present.       Back:  Skin:    General: Skin is warm and dry.  Neurological:     Mental Status: She is alert and oriented to person, place, and time.  Psychiatric:  Cognition and Memory: Cognition is impaired. Memory is impaired.     Labs reviewed: Basic Metabolic Panel: Recent Labs    12/11/19 1055 03/18/20 1537 03/18/20 1918 03/19/20 0343 03/25/20 1405 03/26/20 0500 03/28/20 1802 03/29/20 0405 03/30/20 0730 03/31/20 0704 04/01/20 0839 04/22/20 1118 09/02/20 1440  NA 139   < >  --    < > 138   < > 134* 135  --  138 135 144  --   K 4.5   < >  --    < > 4.3   < > 3.9 3.7  --  4.2 4.4 4.1  --   CL 100   < >  --    < > 95*   < > 91* 90*  --  94* 95* 98  --   CO2 28   < >  --    < > 32   < > 30 32  --  31 30  30*  --   GLUCOSE 110*   < >  --    < > 108*   < > 146* 114*  --  123* 139* 99  --   BUN 16   < >  --    < > 24*   < > 36* 35*  --  32* 29* 25  --   CREATININE 1.25*   < >  --    < > 1.38*   < > 1.83* 1.64*  --  1.52* 1.46* 1.57*  --   CALCIUM 9.5   < >  --    < > 9.4   < > 9.3 9.3  --  9.6 9.3 9.0  --   MG  --    < > 1.8  --  2.2   < > 2.5* 2.4 2.5*  --   --   --   --   PHOS  --   --   --   --  4.5  --   --   --   --   --   --   --   --   TSH 8.24*  --  0.394  --   --   --   --   --   --   --   --   --  0.14*   < > = values in this interval not displayed.   Liver Function Tests: Recent Labs    12/11/19 1055 03/18/20 1918 03/23/20 1155  AST 15 28 15   ALT 10 9 8   ALKPHOS  --  50 47  BILITOT 0.9 1.4* 0.9  PROT 6.5 5.9* 5.4*  ALBUMIN  --  3.5 2.6*   Recent Labs    03/23/20 1155  LIPASE 24  AMYLASE 19*   No results for input(s): AMMONIA in the last 8760 hours. CBC: Recent Labs    03/31/20 0704 04/01/20 0839 04/22/20 1118  WBC 6.2 6.4 5.7  HGB 10.5* 10.3* 10.2*  HCT 33.3* 32.7* 30.6*  MCV 86.5 87.4 86  PLT 248 231 108*   Lipid Panel: Recent Labs    12/11/19 1055 03/19/20 0343 09/02/20 1440  CHOL 143 90 135  HDL 37* 26* 34*  LDLCALC 74 30 70  TRIG 229* 172* 225*  CHOLHDL 3.9 3.5 4.0   TSH: Recent Labs    12/11/19 1055 03/18/20 1918 09/02/20 1440  TSH 8.24* 0.394 0.14*   A1C: Lab Results  Component Value Date   HGBA1C 5.8 (H) 03/18/2020  Assessment/Plan 1. Need for hepatitis C screening test - Hepatitis C antibody  2. Hyperlipidemia LDL goal <70 Continues on crestor 40 mg daily, dietary modifications encouraged.   3. Acquired hypothyroidism -continues on synthroid 175 mcg, low TSH on last labs will follow up - TSH  4. Chronic right-sided low back pain with right-sided sciatica Ongoing,  Continue tylenol and gabapentin with heating pad TID - Ambulatory referral to    5. Overactive bladder Ongoing, continues on myrbetriq.  Recommended scheduled tolieting   6. Chronic congestive heart failure, unspecified heart failure type (HCC) Stable on lasix 40 mg daily with metoprolol and valsartan   7. Essential hypertension -controlled on current regimen.  - CBC with Differential/Platelet - COMPLETE METABOLIC PANEL WITH GFR  8. Morbid (severe) obesity due to excess calories ([place]) -noted today, encouraged proper nutrition with increase in physical activity   9. Thrombocytopenia, unspecified ([place]) Follow up cbc  10. Atherosclerosis of coronary artery bypass graft(s), unspecified, with other forms of angina pectoris (HCC) Stable, without chest pains at this time. Continue on metoprolol and plavix  11. Gait disturbance -using walker, weak due to sedentary lifestyle  - Ambulatory referral to [place]  Next appt: 4 months.  [name]. [place], [facility] [phone]

## 2020-12-07 NOTE — Patient Instructions (Addendum)
Scheduled tolieting- every 2 hours   Continue tylenol and gabapentin with heating pad Will get home health

## 2020-12-08 ENCOUNTER — Other Ambulatory Visit: Payer: Self-pay | Admitting: Nurse Practitioner

## 2020-12-08 ENCOUNTER — Telehealth: Payer: Self-pay | Admitting: Nurse Practitioner

## 2020-12-08 DIAGNOSIS — E039 Hypothyroidism, unspecified: Secondary | ICD-10-CM

## 2020-12-08 LAB — COMPLETE METABOLIC PANEL WITH GFR
AG Ratio: 1.8 (calc) (ref 1.0–2.5)
ALT: 6 U/L (ref 6–29)
AST: 10 U/L (ref 10–35)
Albumin: 4.2 g/dL (ref 3.6–5.1)
Alkaline phosphatase (APISO): 70 U/L (ref 37–153)
BUN/Creatinine Ratio: 14 (calc) (ref 6–22)
BUN: 22 mg/dL (ref 7–25)
CO2: 28 mmol/L (ref 20–32)
Calcium: 9.5 mg/dL (ref 8.6–10.4)
Chloride: 103 mmol/L (ref 98–110)
Creat: 1.61 mg/dL — ABNORMAL HIGH (ref 0.60–0.93)
GFR, Est African American: 35 mL/min/{1.73_m2} — ABNORMAL LOW (ref >=60)
GFR, Est Non African American: 30 mL/min/{1.73_m2} — ABNORMAL LOW (ref >=60)
Globulin: 2.4 g/dL (calc) (ref 1.9–3.7)
Glucose, Bld: 102 mg/dL (ref 65–139)
Potassium: 4.3 mmol/L (ref 3.5–5.3)
Sodium: 140 mmol/L (ref 135–146)
Total Bilirubin: 0.7 mg/dL (ref 0.2–1.2)
Total Protein: 6.6 g/dL (ref 6.1–8.1)

## 2020-12-08 LAB — CBC WITH DIFFERENTIAL/PLATELET
Absolute Monocytes: 416 cells/uL (ref 200–950)
Basophils Absolute: 39 cells/uL (ref 0–200)
Basophils Relative: 0.5 %
Eosinophils Absolute: 69 cells/uL (ref 15–500)
Eosinophils Relative: 0.9 %
HCT: 37 % (ref 35.0–45.0)
Hemoglobin: 12.2 g/dL (ref 11.7–15.5)
Lymphs Abs: 2541 cells/uL (ref 850–3900)
MCH: 28.7 pg (ref 27.0–33.0)
MCHC: 33 g/dL (ref 32.0–36.0)
MCV: 87.1 fL (ref 80.0–100.0)
MPV: 10 fL (ref 7.5–12.5)
Monocytes Relative: 5.4 %
Neutro Abs: 4635 cells/uL (ref 1500–7800)
Neutrophils Relative %: 60.2 %
Platelets: 154 10*3/uL (ref 140–400)
RBC: 4.25 10*6/uL (ref 3.80–5.10)
RDW: 13 % (ref 11.0–15.0)
Total Lymphocyte: 33 %
WBC: 7.7 10*3/uL (ref 3.8–10.8)

## 2020-12-08 LAB — HEPATITIS C ANTIBODY
Hepatitis C Ab: NONREACTIVE
SIGNAL TO CUT-OFF: 0 (ref <1.00)

## 2020-12-08 LAB — TSH: TSH: 0.18 mIU/L — ABNORMAL LOW (ref 0.40–4.50)

## 2020-12-08 MED ORDER — LEVOTHYROXINE SODIUM 150 MCG PO TABS: 150.0000 ug | ORAL_TABLET | Freq: Every day | ORAL | Status: DC

## 2020-12-08 NOTE — Telephone Encounter (Signed)
Opened in error/KM

## 2020-12-21 DIAGNOSIS — G4733 Obstructive sleep apnea (adult) (pediatric): Secondary | ICD-10-CM

## 2020-12-21 DIAGNOSIS — G458 Other transient cerebral ischemic attacks and related syndromes: Secondary | ICD-10-CM

## 2020-12-21 DIAGNOSIS — H9193 Unspecified hearing loss, bilateral: Secondary | ICD-10-CM

## 2020-12-21 DIAGNOSIS — H409 Unspecified glaucoma: Secondary | ICD-10-CM

## 2020-12-21 DIAGNOSIS — M47816 Spondylosis without myelopathy or radiculopathy, lumbar region: Secondary | ICD-10-CM

## 2020-12-21 DIAGNOSIS — I110 Hypertensive heart disease with heart failure: Secondary | ICD-10-CM

## 2020-12-21 DIAGNOSIS — I2510 Atherosclerotic heart disease of native coronary artery without angina pectoris: Secondary | ICD-10-CM | POA: Diagnosis not present

## 2020-12-21 DIAGNOSIS — I509 Heart failure, unspecified: Secondary | ICD-10-CM

## 2020-12-21 DIAGNOSIS — E039 Hypothyroidism, unspecified: Secondary | ICD-10-CM

## 2020-12-22 ENCOUNTER — Other Ambulatory Visit: Payer: Self-pay | Admitting: *Deleted

## 2020-12-22 ENCOUNTER — Telehealth: Payer: Self-pay | Admitting: *Deleted

## 2020-12-22 ENCOUNTER — Other Ambulatory Visit: Payer: Self-pay | Admitting: Nurse Practitioner

## 2020-12-22 ENCOUNTER — Encounter: Payer: Self-pay | Admitting: Internal Medicine

## 2020-12-22 DIAGNOSIS — E039 Hypothyroidism, unspecified: Secondary | ICD-10-CM

## 2020-12-22 MED ORDER — LEVOTHYROXINE SODIUM 150 MCG PO TABS: 150.0000 ug | ORAL_TABLET | Freq: Every day | ORAL | 0 refills | Status: DC

## 2020-12-22 NOTE — Telephone Encounter (Signed)
Medication sent to pharmacy. Called and left message on voicemail that I sent over 2 months worth and Janett Billow is wanting a follow up lab in 8 weeks to call office to schedule lab appointment.

## 2020-12-22 NOTE — Telephone Encounter (Signed)
Gracie with [facility] and wanted verbal orders for PT 2x5wks and 1x4wks.  Verbal orders given.

## 2020-12-25 DIAGNOSIS — I2510 Atherosclerotic heart disease of native coronary artery without angina pectoris: Secondary | ICD-10-CM | POA: Diagnosis not present

## 2020-12-25 DIAGNOSIS — I110 Hypertensive heart disease with heart failure: Secondary | ICD-10-CM | POA: Diagnosis not present

## 2020-12-25 DIAGNOSIS — G458 Other transient cerebral ischemic attacks and related syndromes: Secondary | ICD-10-CM | POA: Diagnosis not present

## 2020-12-25 DIAGNOSIS — E039 Hypothyroidism, unspecified: Secondary | ICD-10-CM | POA: Diagnosis not present

## 2020-12-25 DIAGNOSIS — I509 Heart failure, unspecified: Secondary | ICD-10-CM | POA: Diagnosis not present

## 2020-12-25 DIAGNOSIS — M47816 Spondylosis without myelopathy or radiculopathy, lumbar region: Secondary | ICD-10-CM | POA: Diagnosis not present

## 2020-12-25 DIAGNOSIS — H409 Unspecified glaucoma: Secondary | ICD-10-CM | POA: Diagnosis not present

## 2020-12-25 DIAGNOSIS — H9193 Unspecified hearing loss, bilateral: Secondary | ICD-10-CM | POA: Diagnosis not present

## 2020-12-25 DIAGNOSIS — G4733 Obstructive sleep apnea (adult) (pediatric): Secondary | ICD-10-CM | POA: Diagnosis not present

## 2020-12-28 DIAGNOSIS — I509 Heart failure, unspecified: Secondary | ICD-10-CM | POA: Diagnosis not present

## 2020-12-28 DIAGNOSIS — M47816 Spondylosis without myelopathy or radiculopathy, lumbar region: Secondary | ICD-10-CM | POA: Diagnosis not present

## 2020-12-28 DIAGNOSIS — G458 Other transient cerebral ischemic attacks and related syndromes: Secondary | ICD-10-CM | POA: Diagnosis not present

## 2020-12-28 DIAGNOSIS — G4733 Obstructive sleep apnea (adult) (pediatric): Secondary | ICD-10-CM | POA: Diagnosis not present

## 2020-12-28 DIAGNOSIS — H9193 Unspecified hearing loss, bilateral: Secondary | ICD-10-CM | POA: Diagnosis not present

## 2020-12-28 DIAGNOSIS — I110 Hypertensive heart disease with heart failure: Secondary | ICD-10-CM | POA: Diagnosis not present

## 2020-12-28 DIAGNOSIS — H409 Unspecified glaucoma: Secondary | ICD-10-CM | POA: Diagnosis not present

## 2020-12-28 DIAGNOSIS — E039 Hypothyroidism, unspecified: Secondary | ICD-10-CM | POA: Diagnosis not present

## 2020-12-28 DIAGNOSIS — I2510 Atherosclerotic heart disease of native coronary artery without angina pectoris: Secondary | ICD-10-CM | POA: Diagnosis not present

## 2020-12-30 DIAGNOSIS — E039 Hypothyroidism, unspecified: Secondary | ICD-10-CM | POA: Diagnosis not present

## 2020-12-30 DIAGNOSIS — H9193 Unspecified hearing loss, bilateral: Secondary | ICD-10-CM | POA: Diagnosis not present

## 2020-12-30 DIAGNOSIS — I2510 Atherosclerotic heart disease of native coronary artery without angina pectoris: Secondary | ICD-10-CM | POA: Diagnosis not present

## 2020-12-30 DIAGNOSIS — G4733 Obstructive sleep apnea (adult) (pediatric): Secondary | ICD-10-CM | POA: Diagnosis not present

## 2020-12-30 DIAGNOSIS — G458 Other transient cerebral ischemic attacks and related syndromes: Secondary | ICD-10-CM | POA: Diagnosis not present

## 2020-12-30 DIAGNOSIS — H409 Unspecified glaucoma: Secondary | ICD-10-CM | POA: Diagnosis not present

## 2020-12-30 DIAGNOSIS — I110 Hypertensive heart disease with heart failure: Secondary | ICD-10-CM | POA: Diagnosis not present

## 2020-12-30 DIAGNOSIS — I509 Heart failure, unspecified: Secondary | ICD-10-CM | POA: Diagnosis not present

## 2020-12-30 DIAGNOSIS — M47816 Spondylosis without myelopathy or radiculopathy, lumbar region: Secondary | ICD-10-CM | POA: Diagnosis not present

## 2021-01-04 DIAGNOSIS — M47816 Spondylosis without myelopathy or radiculopathy, lumbar region: Secondary | ICD-10-CM | POA: Diagnosis not present

## 2021-01-04 DIAGNOSIS — I509 Heart failure, unspecified: Secondary | ICD-10-CM | POA: Diagnosis not present

## 2021-01-04 DIAGNOSIS — E039 Hypothyroidism, unspecified: Secondary | ICD-10-CM | POA: Diagnosis not present

## 2021-01-04 DIAGNOSIS — I110 Hypertensive heart disease with heart failure: Secondary | ICD-10-CM | POA: Diagnosis not present

## 2021-01-04 DIAGNOSIS — G458 Other transient cerebral ischemic attacks and related syndromes: Secondary | ICD-10-CM | POA: Diagnosis not present

## 2021-01-04 DIAGNOSIS — H9193 Unspecified hearing loss, bilateral: Secondary | ICD-10-CM | POA: Diagnosis not present

## 2021-01-04 DIAGNOSIS — I2510 Atherosclerotic heart disease of native coronary artery without angina pectoris: Secondary | ICD-10-CM | POA: Diagnosis not present

## 2021-01-04 DIAGNOSIS — G4733 Obstructive sleep apnea (adult) (pediatric): Secondary | ICD-10-CM | POA: Diagnosis not present

## 2021-01-04 DIAGNOSIS — H409 Unspecified glaucoma: Secondary | ICD-10-CM | POA: Diagnosis not present

## 2021-01-05 ENCOUNTER — Other Ambulatory Visit: Payer: Self-pay | Admitting: Nurse Practitioner

## 2021-01-05 DIAGNOSIS — G8929 Other chronic pain: Secondary | ICD-10-CM

## 2021-01-06 DIAGNOSIS — I110 Hypertensive heart disease with heart failure: Secondary | ICD-10-CM | POA: Diagnosis not present

## 2021-01-06 DIAGNOSIS — I509 Heart failure, unspecified: Secondary | ICD-10-CM | POA: Diagnosis not present

## 2021-01-06 DIAGNOSIS — H9193 Unspecified hearing loss, bilateral: Secondary | ICD-10-CM | POA: Diagnosis not present

## 2021-01-06 DIAGNOSIS — G4733 Obstructive sleep apnea (adult) (pediatric): Secondary | ICD-10-CM | POA: Diagnosis not present

## 2021-01-06 DIAGNOSIS — G458 Other transient cerebral ischemic attacks and related syndromes: Secondary | ICD-10-CM | POA: Diagnosis not present

## 2021-01-06 DIAGNOSIS — M47816 Spondylosis without myelopathy or radiculopathy, lumbar region: Secondary | ICD-10-CM | POA: Diagnosis not present

## 2021-01-06 DIAGNOSIS — H409 Unspecified glaucoma: Secondary | ICD-10-CM | POA: Diagnosis not present

## 2021-01-06 DIAGNOSIS — I2510 Atherosclerotic heart disease of native coronary artery without angina pectoris: Secondary | ICD-10-CM | POA: Diagnosis not present

## 2021-01-06 DIAGNOSIS — E039 Hypothyroidism, unspecified: Secondary | ICD-10-CM | POA: Diagnosis not present

## 2021-01-11 DIAGNOSIS — G4733 Obstructive sleep apnea (adult) (pediatric): Secondary | ICD-10-CM | POA: Diagnosis not present

## 2021-01-11 DIAGNOSIS — I509 Heart failure, unspecified: Secondary | ICD-10-CM | POA: Diagnosis not present

## 2021-01-11 DIAGNOSIS — G458 Other transient cerebral ischemic attacks and related syndromes: Secondary | ICD-10-CM | POA: Diagnosis not present

## 2021-01-11 DIAGNOSIS — E039 Hypothyroidism, unspecified: Secondary | ICD-10-CM | POA: Diagnosis not present

## 2021-01-11 DIAGNOSIS — I110 Hypertensive heart disease with heart failure: Secondary | ICD-10-CM | POA: Diagnosis not present

## 2021-01-11 DIAGNOSIS — H9193 Unspecified hearing loss, bilateral: Secondary | ICD-10-CM | POA: Diagnosis not present

## 2021-01-11 DIAGNOSIS — M47816 Spondylosis without myelopathy or radiculopathy, lumbar region: Secondary | ICD-10-CM | POA: Diagnosis not present

## 2021-01-11 DIAGNOSIS — H409 Unspecified glaucoma: Secondary | ICD-10-CM | POA: Diagnosis not present

## 2021-01-11 DIAGNOSIS — I2510 Atherosclerotic heart disease of native coronary artery without angina pectoris: Secondary | ICD-10-CM | POA: Diagnosis not present

## 2021-01-14 DIAGNOSIS — G4733 Obstructive sleep apnea (adult) (pediatric): Secondary | ICD-10-CM | POA: Diagnosis not present

## 2021-01-14 DIAGNOSIS — I2510 Atherosclerotic heart disease of native coronary artery without angina pectoris: Secondary | ICD-10-CM | POA: Diagnosis not present

## 2021-01-14 DIAGNOSIS — I509 Heart failure, unspecified: Secondary | ICD-10-CM | POA: Diagnosis not present

## 2021-01-14 DIAGNOSIS — G458 Other transient cerebral ischemic attacks and related syndromes: Secondary | ICD-10-CM | POA: Diagnosis not present

## 2021-01-14 DIAGNOSIS — I110 Hypertensive heart disease with heart failure: Secondary | ICD-10-CM | POA: Diagnosis not present

## 2021-01-14 DIAGNOSIS — H409 Unspecified glaucoma: Secondary | ICD-10-CM | POA: Diagnosis not present

## 2021-01-14 DIAGNOSIS — H9193 Unspecified hearing loss, bilateral: Secondary | ICD-10-CM | POA: Diagnosis not present

## 2021-01-14 DIAGNOSIS — M47816 Spondylosis without myelopathy or radiculopathy, lumbar region: Secondary | ICD-10-CM | POA: Diagnosis not present

## 2021-01-14 DIAGNOSIS — E039 Hypothyroidism, unspecified: Secondary | ICD-10-CM | POA: Diagnosis not present

## 2021-01-18 DIAGNOSIS — I110 Hypertensive heart disease with heart failure: Secondary | ICD-10-CM | POA: Diagnosis not present

## 2021-01-18 DIAGNOSIS — I509 Heart failure, unspecified: Secondary | ICD-10-CM | POA: Diagnosis not present

## 2021-01-18 DIAGNOSIS — H9193 Unspecified hearing loss, bilateral: Secondary | ICD-10-CM | POA: Diagnosis not present

## 2021-01-18 DIAGNOSIS — G4733 Obstructive sleep apnea (adult) (pediatric): Secondary | ICD-10-CM | POA: Diagnosis not present

## 2021-01-18 DIAGNOSIS — I2510 Atherosclerotic heart disease of native coronary artery without angina pectoris: Secondary | ICD-10-CM | POA: Diagnosis not present

## 2021-01-18 DIAGNOSIS — H409 Unspecified glaucoma: Secondary | ICD-10-CM | POA: Diagnosis not present

## 2021-01-18 DIAGNOSIS — M47816 Spondylosis without myelopathy or radiculopathy, lumbar region: Secondary | ICD-10-CM | POA: Diagnosis not present

## 2021-01-18 DIAGNOSIS — E039 Hypothyroidism, unspecified: Secondary | ICD-10-CM | POA: Diagnosis not present

## 2021-01-18 DIAGNOSIS — G458 Other transient cerebral ischemic attacks and related syndromes: Secondary | ICD-10-CM | POA: Diagnosis not present

## 2021-01-20 DIAGNOSIS — H409 Unspecified glaucoma: Secondary | ICD-10-CM | POA: Diagnosis not present

## 2021-01-20 DIAGNOSIS — I2510 Atherosclerotic heart disease of native coronary artery without angina pectoris: Secondary | ICD-10-CM | POA: Diagnosis not present

## 2021-01-20 DIAGNOSIS — E039 Hypothyroidism, unspecified: Secondary | ICD-10-CM | POA: Diagnosis not present

## 2021-01-20 DIAGNOSIS — I110 Hypertensive heart disease with heart failure: Secondary | ICD-10-CM | POA: Diagnosis not present

## 2021-01-20 DIAGNOSIS — H9193 Unspecified hearing loss, bilateral: Secondary | ICD-10-CM | POA: Diagnosis not present

## 2021-01-20 DIAGNOSIS — M47816 Spondylosis without myelopathy or radiculopathy, lumbar region: Secondary | ICD-10-CM | POA: Diagnosis not present

## 2021-01-20 DIAGNOSIS — G4733 Obstructive sleep apnea (adult) (pediatric): Secondary | ICD-10-CM | POA: Diagnosis not present

## 2021-01-20 DIAGNOSIS — I509 Heart failure, unspecified: Secondary | ICD-10-CM | POA: Diagnosis not present

## 2021-01-20 DIAGNOSIS — G458 Other transient cerebral ischemic attacks and related syndromes: Secondary | ICD-10-CM | POA: Diagnosis not present

## 2021-01-24 ENCOUNTER — Other Ambulatory Visit: Payer: Self-pay | Admitting: Nurse Practitioner

## 2021-01-24 DIAGNOSIS — I509 Heart failure, unspecified: Secondary | ICD-10-CM

## 2021-01-25 DIAGNOSIS — I110 Hypertensive heart disease with heart failure: Secondary | ICD-10-CM | POA: Diagnosis not present

## 2021-01-25 DIAGNOSIS — G4733 Obstructive sleep apnea (adult) (pediatric): Secondary | ICD-10-CM | POA: Diagnosis not present

## 2021-01-25 DIAGNOSIS — G458 Other transient cerebral ischemic attacks and related syndromes: Secondary | ICD-10-CM | POA: Diagnosis not present

## 2021-01-25 DIAGNOSIS — M47816 Spondylosis without myelopathy or radiculopathy, lumbar region: Secondary | ICD-10-CM | POA: Diagnosis not present

## 2021-01-25 DIAGNOSIS — I2510 Atherosclerotic heart disease of native coronary artery without angina pectoris: Secondary | ICD-10-CM | POA: Diagnosis not present

## 2021-01-25 DIAGNOSIS — H9193 Unspecified hearing loss, bilateral: Secondary | ICD-10-CM | POA: Diagnosis not present

## 2021-01-25 DIAGNOSIS — E039 Hypothyroidism, unspecified: Secondary | ICD-10-CM | POA: Diagnosis not present

## 2021-01-25 DIAGNOSIS — H409 Unspecified glaucoma: Secondary | ICD-10-CM | POA: Diagnosis not present

## 2021-01-25 DIAGNOSIS — I509 Heart failure, unspecified: Secondary | ICD-10-CM | POA: Diagnosis not present

## 2021-02-01 DIAGNOSIS — I509 Heart failure, unspecified: Secondary | ICD-10-CM | POA: Diagnosis not present

## 2021-02-01 DIAGNOSIS — H9193 Unspecified hearing loss, bilateral: Secondary | ICD-10-CM | POA: Diagnosis not present

## 2021-02-01 DIAGNOSIS — E039 Hypothyroidism, unspecified: Secondary | ICD-10-CM | POA: Diagnosis not present

## 2021-02-01 DIAGNOSIS — G4733 Obstructive sleep apnea (adult) (pediatric): Secondary | ICD-10-CM | POA: Diagnosis not present

## 2021-02-01 DIAGNOSIS — I2510 Atherosclerotic heart disease of native coronary artery without angina pectoris: Secondary | ICD-10-CM | POA: Diagnosis not present

## 2021-02-01 DIAGNOSIS — M47816 Spondylosis without myelopathy or radiculopathy, lumbar region: Secondary | ICD-10-CM | POA: Diagnosis not present

## 2021-02-01 DIAGNOSIS — I110 Hypertensive heart disease with heart failure: Secondary | ICD-10-CM | POA: Diagnosis not present

## 2021-02-01 DIAGNOSIS — G458 Other transient cerebral ischemic attacks and related syndromes: Secondary | ICD-10-CM | POA: Diagnosis not present

## 2021-02-01 DIAGNOSIS — H409 Unspecified glaucoma: Secondary | ICD-10-CM | POA: Diagnosis not present

## 2021-02-09 DIAGNOSIS — I2510 Atherosclerotic heart disease of native coronary artery without angina pectoris: Secondary | ICD-10-CM | POA: Diagnosis not present

## 2021-02-09 DIAGNOSIS — H9193 Unspecified hearing loss, bilateral: Secondary | ICD-10-CM | POA: Diagnosis not present

## 2021-02-09 DIAGNOSIS — G458 Other transient cerebral ischemic attacks and related syndromes: Secondary | ICD-10-CM | POA: Diagnosis not present

## 2021-02-09 DIAGNOSIS — H409 Unspecified glaucoma: Secondary | ICD-10-CM | POA: Diagnosis not present

## 2021-02-09 DIAGNOSIS — I509 Heart failure, unspecified: Secondary | ICD-10-CM | POA: Diagnosis not present

## 2021-02-09 DIAGNOSIS — E039 Hypothyroidism, unspecified: Secondary | ICD-10-CM | POA: Diagnosis not present

## 2021-02-09 DIAGNOSIS — M47816 Spondylosis without myelopathy or radiculopathy, lumbar region: Secondary | ICD-10-CM | POA: Diagnosis not present

## 2021-02-09 DIAGNOSIS — I110 Hypertensive heart disease with heart failure: Secondary | ICD-10-CM | POA: Diagnosis not present

## 2021-02-09 DIAGNOSIS — G4733 Obstructive sleep apnea (adult) (pediatric): Secondary | ICD-10-CM | POA: Diagnosis not present

## 2021-02-11 ENCOUNTER — Other Ambulatory Visit: Payer: Self-pay | Admitting: Nurse Practitioner

## 2021-02-11 DIAGNOSIS — K219 Gastro-esophageal reflux disease without esophagitis: Secondary | ICD-10-CM

## 2021-02-16 DIAGNOSIS — I2510 Atherosclerotic heart disease of native coronary artery without angina pectoris: Secondary | ICD-10-CM | POA: Diagnosis not present

## 2021-02-16 DIAGNOSIS — H9193 Unspecified hearing loss, bilateral: Secondary | ICD-10-CM | POA: Diagnosis not present

## 2021-02-16 DIAGNOSIS — H409 Unspecified glaucoma: Secondary | ICD-10-CM | POA: Diagnosis not present

## 2021-02-16 DIAGNOSIS — G458 Other transient cerebral ischemic attacks and related syndromes: Secondary | ICD-10-CM | POA: Diagnosis not present

## 2021-02-16 DIAGNOSIS — I110 Hypertensive heart disease with heart failure: Secondary | ICD-10-CM | POA: Diagnosis not present

## 2021-02-16 DIAGNOSIS — M47816 Spondylosis without myelopathy or radiculopathy, lumbar region: Secondary | ICD-10-CM | POA: Diagnosis not present

## 2021-02-16 DIAGNOSIS — I509 Heart failure, unspecified: Secondary | ICD-10-CM | POA: Diagnosis not present

## 2021-02-16 DIAGNOSIS — E039 Hypothyroidism, unspecified: Secondary | ICD-10-CM | POA: Diagnosis not present

## 2021-02-16 DIAGNOSIS — G4733 Obstructive sleep apnea (adult) (pediatric): Secondary | ICD-10-CM | POA: Diagnosis not present

## 2021-02-23 ENCOUNTER — Other Ambulatory Visit: Payer: Self-pay | Admitting: Nurse Practitioner

## 2021-02-26 ENCOUNTER — Other Ambulatory Visit: Payer: Self-pay | Admitting: Nurse Practitioner

## 2021-02-26 DIAGNOSIS — E039 Hypothyroidism, unspecified: Secondary | ICD-10-CM

## 2021-03-11 ENCOUNTER — Other Ambulatory Visit: Payer: Self-pay | Admitting: Nurse Practitioner

## 2021-03-11 DIAGNOSIS — R350 Frequency of micturition: Secondary | ICD-10-CM | POA: Diagnosis not present

## 2021-03-11 DIAGNOSIS — N3946 Mixed incontinence: Secondary | ICD-10-CM | POA: Diagnosis not present

## 2021-03-16 DIAGNOSIS — R69 Illness, unspecified: Secondary | ICD-10-CM | POA: Diagnosis not present

## 2021-03-20 ENCOUNTER — Other Ambulatory Visit: Payer: Self-pay | Admitting: Nurse Practitioner

## 2021-04-07 ENCOUNTER — Other Ambulatory Visit: Payer: Self-pay

## 2021-04-07 ENCOUNTER — Encounter: Payer: Self-pay | Admitting: Nurse Practitioner

## 2021-04-07 ENCOUNTER — Ambulatory Visit (INDEPENDENT_AMBULATORY_CARE_PROVIDER_SITE_OTHER): Payer: Medicare HMO | Admitting: Nurse Practitioner

## 2021-04-07 VITALS — BP 118/72 | HR 67 | Temp 97.3°F | Ht 61.0 in | Wt 194.0 lb

## 2021-04-07 DIAGNOSIS — N3281 Overactive bladder: Secondary | ICD-10-CM

## 2021-04-07 DIAGNOSIS — E785 Hyperlipidemia, unspecified: Secondary | ICD-10-CM

## 2021-04-07 DIAGNOSIS — E039 Hypothyroidism, unspecified: Secondary | ICD-10-CM | POA: Diagnosis not present

## 2021-04-07 DIAGNOSIS — R5381 Other malaise: Secondary | ICD-10-CM

## 2021-04-07 DIAGNOSIS — Z23 Encounter for immunization: Secondary | ICD-10-CM

## 2021-04-07 DIAGNOSIS — I10 Essential (primary) hypertension: Secondary | ICD-10-CM | POA: Diagnosis not present

## 2021-04-07 DIAGNOSIS — F0391 Unspecified dementia with behavioral disturbance: Secondary | ICD-10-CM | POA: Diagnosis not present

## 2021-04-07 NOTE — Progress Notes (Signed)
Careteam: Patient Care Team: Lauree Chandler, NP as PCP - General (Geriatric Medicine) Lorretta Harp, MD as PCP - Cardiology (Cardiology) Irene Shipper, MD as Consulting Physician (Gastroenterology) Frederik Pear, MD as Consulting Physician (Orthopedic Surgery) Fontaine, Belinda Block, MD (Inactive) as Consulting Physician (Gynecology) Rutherford Guys, MD as Consulting Physician (Ophthalmology) Bjorn Loser, MD as Consulting Physician (Urology) Serafina Mitchell, MD as Consulting Physician (Vascular Surgery) Cameron Sprang, MD as Consulting Physician (Neurology) Desma Maxim, MD as Referring Physician (Ophthalmology) Thayer Headings, PMHNP as Nurse Practitioner (Psychiatry) Cameron Sprang, MD as Consulting Physician (Neurology)  PLACE OF SERVICE:  [place] Directive information Does Patient Have a Medical Advance Directive?: Yes, Type of Advance Directive: Olivia Lopez de Gutierrez, Does patient want to make changes to medical advance directive?: No - Patient declined  Allergies  Allergen Reactions   Donepezil Other (See Comments)    Altered mood, aggression, and caused anger   Duloxetine Hcl     Increased confusion and memory concerns    Meloxicam     Pt suspects that this medicine causes fluid on her lungs.    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 Discuss need for td/tdap, shingrix, covid # 4 vaccines, and colonoscopy or exclude. Flu vaccine today. Patient with a lack of interest and sleeping a lot. Patient is anxious. Here with daughter [name].      HPI: Patient is a 80 y.o. female for routine follow up.   Overdue for  colonoscopy- had polyps, brother had colon cancer. She reports she does not want to have another.   Hypothyroid- TSH was low, synthroid was reduced to 150 mcg a few months ago. Due for TSH recheck.   Daughter is trying to encouraged her to get up and get something to drink and stay mobile. She does not like to get up.  Does not like to drink a lot of water  Did PT and did well with that. She has not continued the physical therapy.   Back pain has improved.   Memory loss has progressively has worsen  Review of Systems:  Review of Systems  Constitutional:  Negative for chills, fever and weight loss.  HENT:  Negative for tinnitus.   Respiratory:  Negative for cough, sputum production and shortness of breath.   Cardiovascular:  Negative for chest pain, palpitations and leg swelling.  Gastrointestinal:  Negative for abdominal pain, constipation, diarrhea and heartburn.  Genitourinary:  Negative for dysuria, frequency and urgency.  Musculoskeletal:  Negative for back pain, falls, joint pain and myalgias.  Skin: Negative.   Neurological:  Negative for dizziness and headaches.  Psychiatric/Behavioral:  Positive for memory loss. Negative for depression. The patient is not nervous/anxious and does not have insomnia.    Past Medical History:  Diagnosis Date   AMI 11/17/2008   Qualifier: Diagnosis of  By: Marland Mcalpine     Anxiety    Atherosclerotic peripheral vascular disease ([place])    Bacteremia, escherichia coli 04/27/2015   Brachial-basilar insufficiency syndrome    CAD (coronary artery disease)    followed by dr [name].   Celiac artery stenosis (  HCC)    CHF (congestive heart failure) ([place]) 11/14/2017   Chronic bilateral low back pain without sciatica    Cognitive impairment    Colon polyps    Community acquired pneumonia    Depression    Diverticulosis    Dysuria    Encephalomalacia    GAD (generalized anxiety disorder)    GERD (gastroesophageal reflux disease)    Glaucoma     Hearing loss    Hemorrhoids    Hypertension    Hypothyroidism    Incontinence    Mixed hyperlipidemia    Myocardial infarction (HCC)    OSA on CPAP    Peripheral arterial disease ([place])    Pneumonitis 04/26/2015   Pyelonephritis    Pyelonephritis due to Escherichia coli 04/28/2015   Sepsis ([place])    Sinus drainage    took z-pack   finished yesterday   Spondylosis of lumbar region without myelopathy or radiculopathy    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[place])    Urticaria    Vertigo, benign positional    Past Surgical History:  Procedure Laterality Date   ABDOMINAL AORTOGRAM W/LOWER EXTREMITY N/A 08/22/2017   Procedure: ABDOMINAL AORTOGRAM W/LOWER EXTREMITY;  Surgeon: Serafina Mitchell, MD;  Location: [place] [facility];  Service: Cardiovascular;  Laterality: N/A;   BILATERAL UPPER EXTREMITY ANGIOGRAM N/A 09/11/2012   Procedure: BILATERAL UPPER EXTREMITY ANGIOGRAM;  Surgeon: Serafina Mitchell, MD;  Location: [facility] CATH LAB;  Service: Cardiovascular;  Laterality: N/A;   BIOPSY  03/25/2020   Procedure: BIOPSY;  Surgeon: Irving Copas., MD;  Location: [place];  Service: Gastroenterology;;   carotid duplex doppler Bilateral 09/03/2012, 11/03/2011   Evidence of 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 patent left common carotid- subclavian artery bypass with turbulent flow noted at the anastomosis with velocities of 295 cm/s   CAROTID-SUBCLAVIAN BYPASS GRAFT  12/15/2011   Procedure: BYPASS GRAFT CAROTID-SUBCLAVIAN;  Surgeon: Serafina Mitchell, MD;  Location: [facility];  Service: Vascular;  Laterality: Left;  Left Carotid subclavian bypass   CORONARY ANGIOPLASTY     CORONARY ARTERY BYPASS GRAFT     CORONARY ATHERECTOMY N/A 03/19/2020   Procedure: CORONARY ATHERECTOMY;  Surgeon: Jettie Booze, MD;  Location: [place] [facility];  Service: Cardiovascular;   Laterality: N/A;   CORONARY STENT INTERVENTION N/A 03/19/2020   Procedure: CORONARY STENT INTERVENTION;  Surgeon: Jettie Booze, MD;  Location: [place] [facility];  Service: Cardiovascular;  Laterality: N/A;   DOPPLER ECHOCARDIOGRAPHY  05/27/2010, 09/17/2008   Mild Proximal septal thickening is noted. Left ventricular systolic functions is normal ejection fraction =>55%. the aortic valve appears to be mildly sclerotic    ESOPHAGOGASTRODUODENOSCOPY (EGD) WITH PROPOFOL N/A 03/25/2020   Procedure: ESOPHAGOGASTRODUODENOSCOPY (EGD) WITH PROPOFOL;  Surgeon: Rush Landmark Telford Nab., MD;  Location: [place];  Service: Gastroenterology;  Laterality: N/A;   fem-fem bypass graft  1999   holter monitor  01/21/2008   The predominant rhythm was normal sinus rhythm. Minimum heartrate of 63 bpm at +01:00, maximum heartrate of 105 bpm at + 10:35; and the average heartrate of 75 bpm. Ventricular ectopic activity totaled 1270: Multifocal; 866-PVC's and 404-VEs              JOINT REPLACEMENT     Left knee   LEFT HEART CATH AND CORS/GRAFTS ANGIOGRAPHY N/A 03/19/2020   Procedure: LEFT HEART CATH AND CORS/GRAFTS ANGIOGRAPHY;  Surgeon: Jettie Booze,  MD;  Location: [place] [facility];  Service: Cardiovascular;  Laterality: N/A;   LEFT HEART CATHETERIZATION WITH CORONARY/GRAFT ANGIOGRAM N/A 12/21/2011   Procedure: LEFT HEART CATHETERIZATION WITH Beatrix Fetters;  Surgeon: Leonie Man, MD;  Location: [facility] CATH LAB;  Service: Cardiovascular;  Laterality: N/A;   NM MYOCAR PERF EJECTION FRACTION  09/22/2009, 07/03/2007   the post stress myocardial perfusion images show a normal pattern of perfusion is all regions. The post-stress ejection fraction is 68 %. no significant wall motion abnormalities noted. This is a low risk scan.   PERIPHERAL VASCULAR INTERVENTION  08/22/2017   Procedure: PERIPHERAL VASCULAR INTERVENTION;  Surgeon: Serafina Mitchell, MD;  Location: [place] [facility];  Service:  Cardiovascular;;  Fem/Fem Graft   REPLACEMENT TOTAL KNEE  05-2011   UNILATERAL UPPER EXTREMEITY ANGIOGRAM Left 11/15/2011   Procedure: UNILATERAL UPPER Anselmo Rod;  Surgeon: Lorretta Harp, MD;  Location[facility] C[facility] LAB;  Service: Cardiovascular;  Laterality: Left;   Social History:   reports that she quit smoking about 39 years ago. Her smoking use included cigarettes. She has never used smokeless tobacco. She reports that she does not currently use alcohol. She reports that she does not use drugs.  Family History  Problem Relation Age of Onset   Heart attack Mother    Heart disease Mother        before age 63   Diabetes Father    Heart disease Father    Hypertension Father    Hyperlipidemia Father    Heart attack Father 32   Heart attack Brother 69   Cerebral palsy Sister 33   Congestive Heart Failure Sister 46   Heart attack Sister 41   Hypertension Sister 6   Dementia Sister    Anxiety disorder Sister    Anxiety disorder Sister 45   Heart Problems Sister    Stroke Sister    Heart Problems Sister 35   Heart attack Sister 82   Colon cancer Brother 63   Prostate cancer Brother 81   Hypertension Daughter    Irritable bowel syndrome Daughter    Depression Daughter    Anxiety disorder Daughter     Medications: Patient's Medications  New Prescriptions   No medications on file  Previous Medications   ACETAMINOPHEN (TYLENOL) 325 MG TABLET    Take 325 mg by mouth in the morning and at bedtime. 1 by mouth in the morning and 1 by mouth in the pm.   ALPRAZOLAM (XANAX) 0.5 MG TABLET    Take 1 tablet (0.5 mg total) by mouth daily as needed for anxiety.   ASPIRIN EC 81 MG TABLET    Take 81 mg by mouth at bedtime.    CHOLECALCIFEROL (VITAMIN D) 400 UNITS TABS TABLET    Take 400 Units by mouth daily.   CLOPIDOGREL (PLAVIX) 75 MG TABLET    TAKE 1 TABLET(75 MG) BY MOUTH DAILY   DIVALPROEX (DEPAKOTE ER) 250 MG 24 HR TABLET    Take 1 tablet (250 mg total) by mouth at bedtime.    DORZOLAMIDE (TRUSOPT) 2 % OPHTHALMIC SOLUTION    Place 1 drop into both eyes 2 (two) times daily.   ESCITALOPRAM (LEXAPRO) 20 MG TABLET    Take 1 tablet (20 mg total) by mouth daily.   FARXIGA 10 MG TABS TABLET    TAKE 1 TABLET(10 MG) BY MOUTH DAILY   FUROSEMIDE (LASIX) 40 MG TABLET    TAKE 1 TABLET(40 MG) BY MOUTH DAILY   GABAPENTIN (  NEURONTIN) 100 MG CAPSULE    TAKE 2 CAPSULES(200 MG) BY MOUTH DAILY   LATANOPROST (XALATAN) 0.005 % OPHTHALMIC SOLUTION    Place 1 drop into both eyes at bedtime.   LEVOTHYROXINE (SYNTHROID) 150 MCG TABLET    TAKE 1 TABLET(150 MCG) BY MOUTH DAILY 30 MINUTES BEFORE BREAKFAST ON AN EMPTY STOMACH   MECLIZINE (ANTIVERT) 25 MG TABLET    Take 1 tablet (25 mg total) by mouth as needed for dizziness.   METOPROLOL SUCCINATE (TOPROL-XL) 25 MG 24 HR TABLET    Take 1 tablet (25 mg total) by mouth daily.   MIRABEGRON ER (MYRBETRIQ) 50 MG TB24 TABLET    Take 50 mg by mouth daily.   NITROGLYCERIN (NITROSTAT) 0.4 MG SL TABLET    Place 1 tablet (0.4 mg total) under the tongue every 5 (five) minutes x 3 doses as needed for chest pain.   PANTOPRAZOLE (PROTONIX) 40 MG TABLET    TAKE 1 TABLET EVERY DAY   ROSUVASTATIN (CRESTOR) 40 MG TABLET    TAKE 1 TABLET(40 MG) BY MOUTH DAILY   TIZANIDINE (ZANAFLEX) 2 MG TABLET    Take 1 tablet (2 mg total) by mouth every 6 (six) hours as needed for muscle spasms.   VALSARTAN (DIOVAN) 80 MG TABLET    TAKE 1 TABLET EVERY MORNING   VITAMIN B-12 (CYANOCOBALAMIN) 1000 MCG TABLET    Take 1,000 mcg by mouth daily.  Modified Medications   No medications on file  Discontinued Medications   No medications on file    Physical Exam:  Vitals:   04/07/21 1414  BP: 118/72  Pulse: 67  Temp: (!) 97.3 F (36.3 C)  TempSrc: Temporal  SpO2: 97%  Weight: 194 lb (88 kg)  Height: '5\' 1"'  (1.549 m)   Body mass index is 36.66 kg/m. Wt Readings from Last 3 Encounters:  04/07/21 194 lb (88 kg)  12/07/20 194 lb (88 kg)  09/02/20 190 lb (86.2 kg)    Physical  Exam Constitutional:      General: She is not in acute distress.    Appearance: She is well-developed. She is not diaphoretic.  HENT:     Head: Normocephalic and atraumatic.     Mouth/Throat:     Pharynx: No oropharyngeal exudate.  Eyes:     Conjunctiva/sclera: Conjunctivae normal.     Pupils: Pupils are equal, round, and reactive to light.  Cardiovascular:     Rate and Rhythm: Normal rate and regular rhythm.     Heart sounds: Normal heart sounds.  Pulmonary:     Effort: Pulmonary effort is normal.     Breath sounds: Normal breath sounds.  Abdominal:     General: Bowel sounds are normal.     Palpations: Abdomen is soft.  Musculoskeletal:     Cervical back: Normal range of motion and neck supple.     Right lower leg: No edema.     Left lower leg: No edema.  Skin:    General: Skin is warm and dry.  Neurological:     Mental Status: She is alert.  Psychiatric:        Mood and Affect: Mood normal.    Labs reviewed: Basic Metabolic Panel: Recent Labs    04/22/20 1118 09/02/20 1440 12/07/20 1356  NA 144  --  140  K 4.1  --  4.3  CL 98  --  103  CO2 30*  --  28  GLUCOSE 99  --  102  BUN 25  --  22  CREATININE 1.57*  --  1.61*  CALCIUM 9.0  --  9.5  TSH  --  0.14* 0.18*   Liver Function Tests: Recent Labs    12/07/20 1356  AST 10  ALT 6  BILITOT 0.7  PROT 6.6   No results for input(s): LIPASE, AMYLASE in the last 8760 hours. No results for input(s): AMMONIA in the last 8760 hours. CBC: Recent Labs    04/22/20 1118 12/07/20 1356  WBC 5.7 7.7  NEUTROABS  --  4,635  HGB 10.2* 12.2  HCT 30.6* 37.0  MCV 86 87.1  PLT 108* 154   Lipid Panel: Recent Labs    09/02/20 1440  CHOL 135  HDL 34*  LDLCALC 70  TRIG 225*  CHOLHDL 4.0   TSH: Recent Labs    09/02/20 1440 12/07/20 1356  TSH 0.14* 0.18*   A1C: Lab Results  Component Value Date   HGBA1C 5.8 (H) 03/18/2020     Assessment/Plan 1. Need for influenza vaccination - Flu Vaccine QUAD High  Dose(Fluad)  2. Overactive bladder Ongoing, encouraged scheduled bathroom trips and to continue myrbetriq  3. Hyperlipidemia LDL goal <70 -continues on crestor 40 mg daily with dietary modification  - CMP with eGFR([facility]) - CBC with Differential/Platelet  4. Essential hypertension --stable. Goal bp <140/90. Continue on currnet regimen with low sodium diet.  - CMP with eGFR([facility]) - CBC with Differential/Platelet  5. Physical deconditioning -ongoing, encouraged to continue exercises by PT  6. Dementia with behavioral disturbance, unspecified dementia type ([place]) -progressive decline, continues   7. Acquired hypothyroidism -on synthroid 150 mcg, recently reduced, will follow up TSH - TSH    Next appt: 6 months, sooner if needed  Arnette Driggs K. [place], [facility] [phone]

## 2021-04-07 NOTE — Patient Instructions (Addendum)
Call Dr Henrene Pastor for follow up colonoscopy   To get TDaP, shingrix and COVID vaccine at the pharmacy

## 2021-04-08 LAB — COMPLETE METABOLIC PANEL WITH GFR
AG Ratio: 2 (calc) (ref 1.0–2.5)
ALT: 15 U/L (ref 6–29)
AST: 14 U/L (ref 10–35)
Albumin: 4.6 g/dL (ref 3.6–5.1)
Alkaline phosphatase (APISO): 84 U/L (ref 37–153)
BUN/Creatinine Ratio: 19 (calc) (ref 6–22)
BUN: 38 mg/dL — ABNORMAL HIGH (ref 7–25)
CO2: 27 mmol/L (ref 20–32)
Calcium: 9.9 mg/dL (ref 8.6–10.4)
Chloride: 99 mmol/L (ref 98–110)
Creat: 1.96 mg/dL — ABNORMAL HIGH (ref 0.60–1.00)
Globulin: 2.3 g/dL (calc) (ref 1.9–3.7)
Glucose, Bld: 89 mg/dL (ref 65–139)
Potassium: 4.6 mmol/L (ref 3.5–5.3)
Sodium: 137 mmol/L (ref 135–146)
Total Bilirubin: 0.7 mg/dL (ref 0.2–1.2)
Total Protein: 6.9 g/dL (ref 6.1–8.1)
eGFR: 26 mL/min/{1.73_m2} — ABNORMAL LOW (ref >=60)

## 2021-04-08 LAB — CBC WITH DIFFERENTIAL/PLATELET
Absolute Monocytes: 510 cells/uL (ref 200–950)
Basophils Absolute: 53 cells/uL (ref 0–200)
Basophils Relative: 0.6 %
Eosinophils Absolute: 114 cells/uL (ref 15–500)
Eosinophils Relative: 1.3 %
HCT: 43 % (ref 35.0–45.0)
Hemoglobin: 13.7 g/dL (ref 11.7–15.5)
Lymphs Abs: 3661 cells/uL (ref 850–3900)
MCH: 27.5 pg (ref 27.0–33.0)
MCHC: 31.9 g/dL — ABNORMAL LOW (ref 32.0–36.0)
MCV: 86.2 fL (ref 80.0–100.0)
MPV: 10.2 fL (ref 7.5–12.5)
Monocytes Relative: 5.8 %
Neutro Abs: 4462 cells/uL (ref 1500–7800)
Neutrophils Relative %: 50.7 %
Platelets: 195 10*3/uL (ref 140–400)
RBC: 4.99 10*6/uL (ref 3.80–5.10)
RDW: 14 % (ref 11.0–15.0)
Total Lymphocyte: 41.6 %
WBC: 8.8 10*3/uL (ref 3.8–10.8)

## 2021-04-08 LAB — TSH: TSH: 25.54 mIU/L — ABNORMAL HIGH (ref 0.40–4.50)

## 2021-04-13 ENCOUNTER — Other Ambulatory Visit: Payer: Self-pay

## 2021-04-13 DIAGNOSIS — N289 Disorder of kidney and ureter, unspecified: Secondary | ICD-10-CM

## 2021-04-13 MED ORDER — LEVOTHYROXINE SODIUM 175 MCG PO TABS: 175.0000 ug | ORAL_TABLET | Freq: Every day | ORAL | 0 refills | Status: DC

## 2021-04-15 ENCOUNTER — Ambulatory Visit (INDEPENDENT_AMBULATORY_CARE_PROVIDER_SITE_OTHER): Payer: Medicare HMO | Admitting: Nurse Practitioner

## 2021-04-15 ENCOUNTER — Other Ambulatory Visit: Payer: Self-pay

## 2021-04-15 ENCOUNTER — Encounter: Payer: Self-pay | Admitting: Nurse Practitioner

## 2021-04-15 DIAGNOSIS — Z0000 Encounter for general adult medical examination without abnormal findings: Secondary | ICD-10-CM | POA: Diagnosis not present

## 2021-04-15 NOTE — Progress Notes (Signed)
Subjective:   Deanna [name] is a 80 y.o. female who presents for Medicare Annual (Subsequent) preventive examination.  Review of Systems     Cardiac Risk Factors include: sedentary lifestyle;obesity (BMI >30kg/m2);advanced age (>15men, >39 women);family history of premature cardiovascular disease;hypertension;dyslipidemia     Objective:    There were no vitals filed for this visit. There is no height or weight on file to calculate BMI.  Advanced Directives 04/15/2021 04/07/2021 05/06/2020 03/22/2020 12/13/2019 07/01/2019 04/08/2019  Does Patient Have a Medical Advance Directive? Yes Yes Yes No No No No  Type of Industrial/product designer of [employer] Power of Attorney - - - -  Does patient want to make changes to medical advance directive? No - Patient declined No - Patient declined No - Patient declined - - - -  Copy of [place] in Chart? - Yes - validated most recent copy scanned in chart (See row information) No - copy requested - - - -  Would patient like information on creating a medical advance directive? - - - No - Patient declined Yes (MAU/Ambulatory/Procedural Areas - Information given) No - Patient declined Yes (MAU/Ambulatory/Procedural Areas - Information given)  Pre-existing out of facility DNR order (yellow form or pink MOST form) - - - - - - -    Current Medications (verified) Outpatient Encounter Medications as of 04/15/2021  Medication Sig   acetaminophen (TYLENOL) 325 MG tablet Take 325 mg by mouth in the morning and at bedtime. 1 by mouth in the morning and 1 by mouth in the pm.   ALPRAZolam (XANAX) 0.5 MG tablet Take 1 tablet (0.5 mg total) by mouth daily as needed for anxiety.   aspirin EC 81 MG tablet Take 81 mg by mouth at bedtime.    cholecalciferol (VITAMIN D) 400 units TABS tablet Take 400 Units by mouth daily.   clopidogrel (PLAVIX) 75 MG tablet TAKE 1 TABLET(75 MG) BY MOUTH DAILY   divalproex  (DEPAKOTE ER) 250 MG 24 hr tablet Take 1 tablet (250 mg total) by mouth at bedtime.   dorzolamide (TRUSOPT) 2 % ophthalmic solution Place 1 drop into both eyes 2 (two) times daily.   escitalopram (LEXAPRO) 20 MG tablet Take 1 tablet (20 mg total) by mouth daily.   FARXIGA 10 MG TABS tablet TAKE 1 TABLET(10 MG) BY MOUTH DAILY   furosemide (LASIX) 40 MG tablet TAKE 1 TABLET(40 MG) BY MOUTH DAILY   gabapentin (NEURONTIN) 100 MG capsule TAKE 2 CAPSULES(200 MG) BY MOUTH DAILY   latanoprost (XALATAN) 0.005 % ophthalmic solution Place 1 drop into both eyes at bedtime.   levothyroxine (SYNTHROID) 175 MCG tablet Take 1 tablet (175 mcg total) by mouth daily. Take at the same time daily separate from other medications   meclizine (ANTIVERT) 25 MG tablet Take 1 tablet (25 mg total) by mouth as needed for dizziness.   metoprolol succinate (TOPROL-XL) 25 MG 24 hr tablet Take 1 tablet (25 mg total) by mouth daily.   mirabegron ER (MYRBETRIQ) 50 MG TB24 tablet Take 50 mg by mouth daily.   nitroGLYCERIN (NITROSTAT) 0.4 MG SL tablet Place 1 tablet (0.4 mg total) under the tongue every 5 (five) minutes x 3 doses as needed for chest pain.   pantoprazole (PROTONIX) 40 MG tablet TAKE 1 TABLET EVERY DAY   rosuvastatin (CRESTOR) 40 MG tablet TAKE 1 TABLET(40 MG) BY MOUTH DAILY   tiZANidine (ZANAFLEX) 2 MG tablet Take 1 tablet (2 mg total) by  mouth every 6 (six) hours as needed for muscle spasms.   valsartan (DIOVAN) 80 MG tablet TAKE 1 TABLET EVERY MORNING   vitamin B-12 (CYANOCOBALAMIN) 1000 MCG tablet Take 1,000 mcg by mouth daily.   No facility-administered encounter medications on file as of 04/15/2021.    Allergies (verified) Donepezil, Duloxetine hcl, Meloxicam, Memantine, Oxycodone, and Vicodin [hydrocodone-acetaminophen]   History: Past Medical History:  Diagnosis Date   AMI 11/17/2008   Qualifier: Diagnosis of  By: Marland Mcalpine     Anxiety    Atherosclerotic peripheral vascular disease ([place])     Bacteremia, escherichia coli 04/27/2015   Brachial-basilar insufficiency syndrome    CAD (coronary artery disease)    followed by dr [name].   Celiac artery stenosis (HCC)    CHF (congestive heart failure) ([place]) 11/14/2017   Chronic bilateral low back pain without sciatica    Cognitive impairment    Colon polyps    Community acquired pneumonia    Depression    Diverticulosis    Dysuria    Encephalomalacia    GAD (generalized anxiety disorder)    GERD (gastroesophageal reflux disease)    Glaucoma    Hearing loss    Hemorrhoids    Hypertension    Hypothyroidism    Incontinence    Mixed hyperlipidemia    Myocardial infarction (HCC)    OSA on CPAP    Peripheral arterial disease ([place])    Pneumonitis 04/26/2015   Pyelonephritis    Pyelonephritis due to Escherichia coli 04/28/2015   Sepsis ([place])    Sinus drainage    took z-pack   finished yesterday   Spondylosis of lumbar region without myelopathy or radiculopathy    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[place])    Urticaria    Vertigo, benign positional    Past Surgical History:  Procedure Laterality Date   ABDOMINAL AORTOGRAM W/LOWER EXTREMITY N/A 08/22/2017   Procedure: ABDOMINAL AORTOGRAM W/LOWER EXTREMITY;  Surgeon: Serafina Mitchell, MD;  Location: [facility] [facility];  Service: Cardiovascular;  Laterality: N/A;   BILATERAL UPPER EXTREMITY ANGIOGRAM N/A 09/11/2012   Procedure: BILATERAL UPPER EXTREMITY ANGIOGRAM;  Surgeon: Serafina Mitchell, MD;  Location: [facility] CATH LAB;  Service: Cardiovascular;  Laterality: N/A;   BIOPSY  03/25/2020   Procedure: BIOPSY;  Surgeon: Irving Copas., MD;  Location: [facility];  Service: Gastroenterology;;   carotid duplex doppler Bilateral 09/03/2012, 11/03/2011   Evidence of 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 patent left common carotid- subclavian artery  bypass with turbulent flow noted at the anastomosis with velocities of 295 cm/s   CAROTID-SUBCLAVIAN BYPASS GRAFT  12/15/2011   Procedure: BYPASS GRAFT CAROTID-SUBCLAVIAN;  Surgeon: Serafina Mitchell, MD;  Location: [facility] OR;  Service: Vascular;  Laterality: Left;  Left Carotid subclavian bypass   CORONARY ANGIOPLASTY     CORONARY ARTERY BYPASS GRAFT     CORONARY ATHERECTOMY N/A 03/19/2020   Procedure: CORONARY ATHERECTOMY;  Surgeon: Jettie Booze, MD;  Location: [place] [facility];  Service: Cardiovascular;  Laterality: N/A;   CORONARY STENT INTERVENTION N/A 03/19/2020   Procedure: CORONARY STENT INTERVENTION;  Surgeon: Jettie Booze, MD;  Location: [place] [facility];  Service: Cardiovascular;  Laterality: N/A;   DOPPLER ECHOCARDIOGRAPHY  05/27/2010, 09/17/2008   Mild Proximal septal thickening is noted. Left ventricular systolic functions is normal ejection fraction =>55%. the aortic valve appears to be mildly sclerotic  ESOPHAGOGASTRODUODENOSCOPY (EGD) WITH PROPOFOL N/A 03/25/2020   Procedure: ESOPHAGOGASTRODUODENOSCOPY (EGD) WITH PROPOFOL;  Surgeon: Rush Landmark Telford Nab., MD;  Location: [facility];  Service: Gastroenterology;  Laterality: N/A;   fem-fem bypass graft  1999   holter monitor  01/21/2008   The predominant rhythm was normal sinus rhythm. Minimum heartrate of 63 bpm at +01:00, maximum heartrate of 105 bpm at + 10:35; and the average heartrate of 75 bpm. Ventricular ectopic activity totaled 1270: Multifocal; 866-PVC's and 404-VEs              JOINT REPLACEMENT     Left knee   LEFT HEART CATH AND CORS/GRAFTS ANGIOGRAPHY N/A 03/19/2020   Procedure: LEFT HEART CATH AND CORS/GRAFTS ANGIOGRAPHY;  Surgeon: Jettie Booze, MD;  Location: [place] [facility];  Service: Cardiovascular;  Laterality: N/A;   LEFT HEART CATHETERIZATION WITH CORONARY/GRAFT ANGIOGRAM N/A 12/21/2011   Procedure: LEFT HEART CATHETERIZATION WITH Beatrix Fetters;  Surgeon: Leonie Man, MD;   Location: [facility] CATH LAB;  Service: Cardiovascular;  Laterality: N/A;   NM MYOCAR PERF EJECTION FRACTION  09/22/2009, 07/03/2007   the post stress myocardial perfusion images show a normal pattern of perfusion is all regions. The post-stress ejection fraction is 68 %. no significant wall motion abnormalities noted. This is a low risk scan.   PERIPHERAL VASCULAR INTERVENTION  08/22/2017   Procedure: PERIPHERAL VASCULAR INTERVENTION;  Surgeon: Serafina Mitchell, MD;  Location: [place] [facility];  Service: Cardiovascular;;  Fem/Fem Graft   REPLACEMENT TOTAL KNEE  05-2011   UNILATERAL UPPER EXTREMEITY ANGIOGRAM Left 11/15/2011   Procedure: UNILATERAL UPPER Anselmo Rod;  Surgeon: Lorretta Harp, MD;  Location: [facility] CATH LAB;  Service: Cardiovascular;  Laterality: Left;   Family History  Problem Relation Age of Onset   Heart attack Mother    Heart disease Mother        before age 32   Diabetes Father    Heart disease Father    Hypertension Father    Hyperlipidemia Father    Heart attack Father 80   Heart attack Brother 9   Cerebral palsy Sister 59   Congestive Heart Failure Sister 70   Heart attack Sister 75   Hypertension Sister 13   Dementia Sister    Anxiety disorder Sister    Anxiety disorder Sister 33   Heart Problems Sister    Stroke Sister    Heart Problems Sister 39   Heart attack Sister 76   Colon cancer Brother 38   Prostate cancer Brother 75   Hypertension Daughter    Irritable bowel syndrome Daughter    Depression Daughter    Anxiety disorder Daughter    Social History   Socioeconomic History   Marital status: Divorced    Spouse name: Not on file   Number of children: Not on file   Years of education: Not on file   Highest education level: Not on file  Occupational History   Not on file  Tobacco Use   Smoking status: Former    Years: 3.00    Types: Cigarettes    Quit date: 11/27/1981    Years since quitting: 39.4   Smokeless tobacco: Never  Vaping Use    Vaping Use: Never used  Substance and Sexual Activity   Alcohol use: Not Currently    Alcohol/week: 0.0 standard drinks   Drug use: No   Sexual activity: Not Currently    Comment: 1st intercourse 23 yo-1 partner  Other Topics Concern   Not  on file  Social History Narrative   Social History      Diet? Healthy but too many sweets      Do you drink/eat things with caffeine? Yes occasionally      Marital status?                    D                What year were you married?      Do you live in a house, apartment, assisted living, condo, trailer, etc.? condo      Is it one or more stories? 1      How many persons live in your home? 2      Do you have any pets in your home? (please list) 1 cat      Highest level of education completed? 1 year college      Current or past profession: housewife      Do you exercise?           no                           Type & how often?      Advanced Directives      Do you have a living will? no      Do you have a DNR form?             no                     If not, do you want to discuss one? yes      Do you have signed POA/HPOA for forms? no      Functional Status      Do you have difficulty bathing or dressing yourself? No- gets tired      Do you have difficulty preparing food or eating? No- eats frozen entrees or poor nutrition meals      Do you have difficulty managing your medications? yes      Do you have difficulty managing your finances? yes      Do you have difficulty affording your medications? No- funds running low   Social Determinants of Radio broadcast assistant Strain: Not on file  Food Insecurity: Not on file  Transportation Needs: Not on file  Physical Activity: Not on file  Stress: Not on file  Social Connections: Not on file    Tobacco Counseling Counseling given: Not Answered   Clinical Intake:  Pre-visit preparation completed: Yes  Pain : No/denies pain     BMI - recorded: 36 Nutritional  Status: BMI > 30  Obese Diabetes: No  How often do you need to have someone help you when you read instructions, pamphlets, or other written materials from your doctor or pharmacy?: 3 - Sometimes  Diabetic?no         Activities of Daily Living In your present state of health, do you have any difficulty performing the following activities: 04/15/2021  Hearing? Y  Vision? N  Difficulty concentrating or making decisions? Y  Walking or climbing stairs? N  Dressing or bathing? N  Doing errands, shopping? Y  Preparing Food and eating ? N  Using the Toilet? N  In the past six months, have you accidently leaked urine? Y  Do you have problems with loss of bowel control? N  Managing your Medications? Y  Comment daughter helps  Managing your Finances? Y  Comment daughter helps  Housekeeping or managing your Housekeeping? Y  Comment daughter helps  Some recent data might be hidden    Patient Care Team: Lauree Chandler, NP as PCP - General (Geriatric Medicine) Lorretta Harp, MD as PCP - Cardiology (Cardiology) Irene Shipper, MD as Consulting Physician (Gastroenterology) Frederik Pear, MD as Consulting Physician (Orthopedic Surgery) Fontaine, Belinda Block, MD (Inactive) as Consulting Physician (Gynecology) Rutherford Guys, MD as Consulting Physician (Ophthalmology) Bjorn Loser, MD as Consulting Physician (Urology) Serafina Mitchell, MD as Consulting Physician (Vascular Surgery) Cameron Sprang, MD as Consulting Physician (Neurology) Desma Maxim, MD as Referring Physician (Ophthalmology) Thayer Headings, PMHNP as Nurse Practitioner (Psychiatry) Cameron Sprang, MD as Consulting Physician (Neurology)  Indicate any recent Medical Services you may have received from other than [facility] providers in the past year (date may be approximate).     Assessment:   This is a routine wellness examination for [place].  Hearing/Vision screen Hearing Screening - Comments:: Patient has a  little bit of a hearing issue. Vision Screening - Comments:: Patient wears glasses. Patient not sure about eye exam.  Dietary issues and exercise activities discussed: Current Exercise Habits: The patient does not participate in regular exercise at present   Goals Addressed   None    Depression Screen PHQ 2/9 Scores 04/15/2021 09/02/2020 05/06/2020 04/08/2019 11/29/2017 11/17/2015 10/15/2015  PHQ - 2 Score 0 - 0 - 6 1 1   PHQ- 9 Score - - - - 18 - -  Exception Documentation - Other- indicate reason in comment box - Other- indicate reason in comment box - - -  Not completed - Seeing psych - Already under the care [place] - - -    Fall Risk Fall Risk  04/15/2021 04/07/2021 09/02/2020 05/06/2020 12/13/2019  Falls in the past year? 0 1 0 0 1  Number falls in past yr: 0 1 0 1 0  Injury with Fall? 0 0 0 0 0  Risk for fall due to : No Fall Risks History of fall(s) - - -  Follow up Falls evaluation completed Falls evaluation completed - - -    FALL RISK PREVENTION PERTAINING TO THE HOME:  Any stairs in or around the home? No  If so, are there any without handrails? No  Home free of loose throw rugs in walkways, pet beds, electrical cords, etc? Yes  Adequate lighting in your home to reduce risk of falls? Yes   ASSISTIVE DEVICES UTILIZED TO PREVENT FALLS:  Life alert? No  Use of a cane, walker or w/c? No  Grab bars in the bathroom? Yes  Shower chair or bench in shower? Yes  Elevated toilet seat or a handicapped toilet? No   TIMED UP AND GO:  Was the test performed? No .    Cognitive Function: MMSE - Mini Mental State Exam 03/07/2018 05/30/2014  Orientation to time 5 5  Orientation to Place 5 5  Registration 3 3  Attention/ Calculation 5 5  Recall 2 2  Language- name 2 objects 2 2  Language- repeat 1 1  Language- follow 3 step command 3 3  Language- read & follow direction 1 1  Write a sentence 1 1  Copy design 1 1  Total score 29 29   Montreal Cognitive Assessment  11/20/2018   Visuospatial/ Executive (0/5) 0  Naming (0/3) 0  Attention: Read list of digits (0/2) 2  Attention: Read list of letters (0/1) 1  Attention:  Serial 7 subtraction starting at 100 (0/3) 2  Language: Repeat phrase (0/2) 1  Language : Fluency (0/1) 0  Abstraction (0/2) 0  Delayed Recall (0/5) 2  Orientation (0/6) 5  Total 13   6CIT Screen 04/15/2021  What Year? 0 points  What month? 0 points  What time? 0 points  Count back from 20 0 points  Months in reverse 4 points  Repeat phrase 6 points  Total Score 10    Immunizations Immunization History  Administered Date(s) Administered   Fluad Quad(high Dose 65+) 04/08/2019, 05/06/2020, 04/07/2021   Influenza, High Dose Seasonal PF 06/22/2016, 07/13/2017, 05/31/2018   Influenza,inj,Quad PF,6+ Mos 06/11/2013   PFIZER(Purple Top)SARS-COV-2 Vaccination 09/22/2019, 10/15/2019, 08/18/2020   Pneumococcal Conjugate-13 11/25/2016   Pneumococcal Polysaccharide-23 07/08/2015   Zoster, Live 06/22/2016    TDAP status: Due, Education has been provided regarding the importance of this vaccine. Advised may receive this vaccine at local pharmacy or Health Dept. Aware to provide a copy of the vaccination record if obtained from local pharmacy or Health Dept. Verbalized acceptance and understanding.  Flu Vaccine status: Up to date  Pneumococcal vaccine status: Up to date  Covid-19 vaccine status: Information provided on how to obtain vaccines.   Qualifies for Shingles Vaccine? Yes   Zostavax completed No   Shingrix Completed?: No.    Education has been provided regarding the importance of this vaccine. Patient has been advised to call insurance company to determine out of pocket expense if they have not yet received this vaccine. Advised may also receive vaccine at local pharmacy or Health Dept. Verbalized acceptance and understanding.  Screening Tests Health Maintenance  Topic Date Due   TETANUS/TDAP  Never done   Zoster Vaccines- Shingrix (1  of 2) Never done   COVID-19 Vaccine (4 - Booster for Pfizer series) 11/10/2020   COLONOSCOPY (Pts 45-63yrs Insurance coverage will need to be confirmed)  12/22/2020   INFLUENZA VACCINE  Completed   DEXA SCAN  Completed   Hepatitis C Screening  Completed   PNA vac Low Risk Adult  Completed   HPV VACCINES  Aged Out    Health Maintenance  Health Maintenance Due  Topic Date Due   TETANUS/TDAP  Never done   Zoster Vaccines- Shingrix (1 of 2) Never done   COVID-19 Vaccine (4 - Booster for Pfizer series) 11/10/2020   COLONOSCOPY (Pts 45-86yrs Insurance coverage will need to be confirmed)  12/22/2020    Colorectal cancer screening: Referral to GI placed with Dr Henrene Pastor. Pt aware the office will call re: appt.  Mammogram status: No longer required due to age.  Declines bone density   Lung Cancer Screening: (Low Dose CT Chest recommended if Age 19-80 years, 30 pack-year currently smoking OR have quit w/in 15years.) does not qualify.   Lung Cancer Screening Referral: na  Additional Screening:  Hepatitis C Screening: does qualify; Completed 2022  Vision Screening: Recommended annual ophthalmology exams for early detection of glaucoma and other disorders of the eye. Is the patient up to date with their annual eye exam?  No  Who is the provider or what is the name of the office in which the patient attends annual eye exams? unsure If pt is not established with a provider, would they like to be referred to a provider to establish care? No .   Dental Screening: Recommended annual dental exams for proper oral hygiene  Community Resource Referral / Chronic Care Management: CRR required this visit?  No   CCM required this  visit?  No      Plan:     I have personally reviewed and noted the following in the patient's chart:   Medical and social history Use of alcohol, tobacco or illicit drugs  Current medications and supplements including opioid prescriptions.  Functional ability and  status Nutritional status Physical activity Advanced directives List of other physicians Hospitalizations, surgeries, and ER visits in previous 12 months Vitals Screenings to include cognitive, depression, and falls Referrals and appointments  In addition, I have reviewed and discussed with patient certain preventive protocols, quality metrics, and best practice recommendations. A written personalized care plan for preventive services as well as general preventive health recommendations were provided to patient.     Lauree Chandler, NP   04/15/2021   Virtual Visit via Telephone Note  I connected withNAME@ on 04/15/21 at  2:45 PM EDT by telephone and verified that I am speaking with the correct person using two identifiers.  Location: Patient: home  Provider: [facility]   I discussed the limitations, risks, security and privacy concerns of performing an evaluation and management service by telephone and the availability of in person appointments. I also discussed with the patient that there may be a patient responsible charge related to this service. The patient expressed understanding and agreed to proceed.   I discussed the assessment and treatment plan with the patient. The patient was provided an opportunity to ask questions and all were answered. The patient agreed with the plan and demonstrated an understanding of the instructions.   The patient was advised to call back or seek an in-person evaluation if the symptoms worsen or if the condition fails to improve as anticipated.  I provided 18 minutes of non-face-to-face time during this encounter.  [name]. Harle Battiest Avs printed and mailed

## 2021-04-15 NOTE — Progress Notes (Signed)
This service is provided via telemedicine  No vital signs collected/recorded due to the encounter was a telemedicine visit.   Location of patient (ex: home, work):  Home  Patient consents to a telephone visit:  Yes, see encounter dated 04/15/2021  Location of the provider (ex: office, home):  [place]  Name of any referring provider:  N/A  Names of all persons participating in the telemedicine service and their role in the encounter:  Sherrie Mustache, Nurse Practitioner, Carroll Kinds, CMA, and patient.   Time spent on call:  14 minutes with medical assistant

## 2021-04-15 NOTE — Patient Instructions (Signed)
[name] , Thank you for taking time to come for your Medicare Wellness Visit. I appreciate your ongoing commitment to your health goals. Please review the following plan we discussed and let me know if I can assist you in the future.   Screening recommendations/referrals: Colonoscopy - recommended to schedule follow up Mammogram aged out Bone Density RECOMMENDED- let us know if you want [place] to order Recommended yearly ophthalmology/optometry visit for glaucoma screening and checkup Recommended yearly dental visit for hygiene and checkup  Vaccinations: Influenza vaccine up to date Pneumococcal vaccine up to date Tdap vaccine up to date Shingles vaccine RECOMMENDED- to get at local pharmacy COVID- recommended to get at your local pharmacy    Advanced directives: on file.   Conditions/risks identified: progressive memory loss, fall risk.   Next appointment: 1 year   Preventive Care 22 Years and Older, Female Preventive care refers to lifestyle choices and visits with your health care provider that can promote health and wellness. What does preventive care include? A yearly physical exam. This is also called an annual well check. Dental exams once or twice a year. Routine eye exams. Ask your health care provider how often you should have your eyes checked. Personal lifestyle choices, including: Daily care of your teeth and gums. Regular physical activity. Eating a healthy diet. Avoiding tobacco and drug use. Limiting alcohol use. Practicing safe sex. Taking low-dose aspirin every day. Taking vitamin and mineral supplements as recommended by your health care provider. What happens during an annual well check? The services and screenings done by your health care provider during your annual well check will depend on your age, overall health, lifestyle risk factors, and family history of disease. Counseling  Your health care provider may ask you questions about your: Alcohol  use. Tobacco use. Drug use. Emotional well-being. Home and relationship well-being. Sexual activity. Eating habits. History of falls. Memory and ability to understand (cognition). Work and work Statistician. Reproductive health. Screening  You may have the following tests or measurements: Height, weight, and BMI. Blood pressure. Lipid and cholesterol levels. These may be checked every 5 years, or more frequently if you are over 78 years old. Skin check. Lung cancer screening. You may have this screening every year starting at age 82 if you have a 30-pack-year history of smoking and currently smoke or have quit within the past 15 years. Fecal occult blood test (FOBT) of the stool. You may have this test every year starting at age 42. Flexible sigmoidoscopy or colonoscopy. You may have a sigmoidoscopy every 5 years or a colonoscopy every 10 years starting at age 26. Hepatitis C blood test. Hepatitis B blood test. Sexually transmitted disease (STD) testing. Diabetes screening. This is done by checking your blood sugar (glucose) after you have not eaten for a while (fasting). You may have this done every 1-3 years. Bone density scan. This is done to screen for osteoporosis. You may have this done starting at age 56. Mammogram. This may be done every 1-2 years. Talk to your health care provider about how often you should have regular mammograms. Talk with your health care provider about your test results, treatment options, and if necessary, the need for more tests. Vaccines  Your health care provider may recommend certain vaccines, such as: Influenza vaccine. This is recommended every year. Tetanus, diphtheria, and acellular pertussis (Tdap, Td) vaccine. You may need a Td booster every 10 years. Zoster vaccine. You may need this after age 36. Pneumococcal 13-valent conjugate (PCV13)  vaccine. One dose is recommended after age 51. Pneumococcal polysaccharide (PPSV23) vaccine. One dose is  recommended after age 90. Talk to your health care provider about which screenings and vaccines you need and how often you need them. This information is not intended to replace advice given to you by your health care provider. Make sure you discuss any questions you have with your health care provider. Document Released: 08/21/2015 Document Revised: 04/13/2016 Document Reviewed: 05/26/2015 Elsevier Interactive Patient Education  2017 [place] Prevention in the Home Falls can cause injuries. They can happen to people of all ages. There are many things you can do to make your home safe and to help prevent falls. What can I do on the outside of my home? Regularly fix the edges of walkways and driveways and fix any cracks. Remove anything that might make you trip as you walk through a door, such as a raised step or threshold. Trim any bushes or trees on the path to your home. Use bright outdoor lighting. Clear any walking paths of anything that might make someone trip, such as rocks or tools. Regularly check to see if handrails are loose or broken. Make sure that both sides of any steps have handrails. Any raised decks and porches should have guardrails on the edges. Have any leaves, snow, or ice cleared regularly. Use sand or salt on walking paths during winter. Clean up any spills in your garage right away. This includes oil or grease spills. What can I do in the bathroom? Use night lights. Install grab bars by the toilet and in the tub and shower. Do not use towel bars as grab bars. Use non-skid mats or decals in the tub or shower. If you need to sit down in the shower, use a plastic, non-slip stool. Keep the floor dry. Clean up any water that spills on the floor as soon as it happens. Remove soap buildup in the tub or shower regularly. Attach bath mats securely with double-sided non-slip rug tape. Do not have throw rugs and other things on the floor that can make you  trip. What can I do in the bedroom? Use night lights. Make sure that you have a light by your bed that is easy to reach. Do not use any sheets or blankets that are too big for your bed. They should not hang down onto the floor. Have a firm chair that has side arms. You can use this for support while you get dressed. Do not have throw rugs and other things on the floor that can make you trip. What can I do in the kitchen? Clean up any spills right away. Avoid walking on wet floors. Keep items that you use a lot in easy-to-reach places. If you need to reach something above you, use a strong step stool that has a grab bar. Keep electrical cords out of the way. Do not use floor polish or wax that makes floors slippery. If you must use wax, use non-skid floor wax. Do not have throw rugs and other things on the floor that can make you trip. What can I do with my stairs? Do not leave any items on the stairs. Make sure that there are handrails on both sides of the stairs and use them. Fix handrails that are broken or loose. Make sure that handrails are as long as the stairways. Check any carpeting to make sure that it is firmly attached to the stairs. Fix any carpet that is loose  or worn. Avoid having throw rugs at the top or bottom of the stairs. If you do have throw rugs, attach them to the floor with carpet tape. Make sure that you have a light switch at the top of the stairs and the bottom of the stairs. If you do not have them, ask someone to add them for you. What else can I do to help prevent falls? Wear shoes that: Do not have high heels. Have rubber bottoms. Are comfortable and fit you well. Are closed at the toe. Do not wear sandals. If you use a stepladder: Make sure that it is fully opened. Do not climb a closed stepladder. Make sure that both sides of the stepladder are locked into place. Ask someone to hold it for you, if possible. Clearly mark and make sure that you can  see: Any grab bars or handrails. First and last steps. Where the edge of each step is. Use tools that help you move around (mobility aids) if they are needed. These include: Canes. Walkers. Scooters. Crutches. Turn on the lights when you go into a dark area. Replace any light bulbs as soon as they burn out. Set up your furniture so you have a clear path. Avoid moving your furniture around. If any of your floors are uneven, fix them. If there are any pets around you, be aware of where they are. Review your medicines with your doctor. Some medicines can make you feel dizzy. This can increase your chance of falling. Ask your doctor what other things that you can do to help prevent falls. This information is not intended to replace advice given to you by your health care provider. Make sure you discuss any questions you have with your health care provider. Document Released: 05/21/2009 Document Revised: 12/31/2015 Document Reviewed: 08/29/2014 Elsevier Interactive Patient Education  2017 [employer].

## 2021-04-16 ENCOUNTER — Other Ambulatory Visit: Payer: Self-pay

## 2021-04-16 DIAGNOSIS — E039 Hypothyroidism, unspecified: Secondary | ICD-10-CM

## 2021-05-09 ENCOUNTER — Inpatient Hospital Stay (HOSPITAL_COMMUNITY)
Admission: EM | Admit: 2021-05-09 | Discharge: 2021-05-11 | DRG: 202 | Disposition: A | Discharge status: Home Health | Payer: Medicare HMO | Attending: Internal Medicine | Admitting: Internal Medicine

## 2021-05-09 ENCOUNTER — Inpatient Hospital Stay (HOSPITAL_COMMUNITY): Payer: Medicare HMO

## 2021-05-09 ENCOUNTER — Other Ambulatory Visit: Payer: Self-pay

## 2021-05-09 ENCOUNTER — Encounter (HOSPITAL_COMMUNITY): Payer: Self-pay | Admitting: Internal Medicine

## 2021-05-09 ENCOUNTER — Emergency Department (HOSPITAL_COMMUNITY): Payer: Medicare HMO

## 2021-05-09 DIAGNOSIS — E7800 Pure hypercholesterolemia, unspecified: Secondary | ICD-10-CM | POA: Diagnosis not present

## 2021-05-09 DIAGNOSIS — I493 Ventricular premature depolarization: Secondary | ICD-10-CM | POA: Diagnosis present

## 2021-05-09 DIAGNOSIS — J9601 Acute respiratory failure with hypoxia: Secondary | ICD-10-CM | POA: Diagnosis not present

## 2021-05-09 DIAGNOSIS — I214 Non-ST elevation (NSTEMI) myocardial infarction: Secondary | ICD-10-CM

## 2021-05-09 DIAGNOSIS — Z6834 Body mass index (BMI) 34.0-34.9, adult: Secondary | ICD-10-CM | POA: Diagnosis not present

## 2021-05-09 DIAGNOSIS — E876 Hypokalemia: Secondary | ICD-10-CM

## 2021-05-09 DIAGNOSIS — R079 Chest pain, unspecified: Secondary | ICD-10-CM

## 2021-05-09 DIAGNOSIS — F411 Generalized anxiety disorder: Secondary | ICD-10-CM | POA: Diagnosis present

## 2021-05-09 DIAGNOSIS — K219 Gastro-esophageal reflux disease without esophagitis: Secondary | ICD-10-CM | POA: Diagnosis present

## 2021-05-09 DIAGNOSIS — F32A Depression, unspecified: Secondary | ICD-10-CM | POA: Diagnosis present

## 2021-05-09 DIAGNOSIS — I700 Atherosclerosis of aorta: Secondary | ICD-10-CM | POA: Diagnosis not present

## 2021-05-09 DIAGNOSIS — J209 Acute bronchitis, unspecified: Principal | ICD-10-CM

## 2021-05-09 DIAGNOSIS — I252 Old myocardial infarction: Secondary | ICD-10-CM | POA: Diagnosis not present

## 2021-05-09 DIAGNOSIS — Z955 Presence of coronary angioplasty implant and graft: Secondary | ICD-10-CM

## 2021-05-09 DIAGNOSIS — R000 Tachycardia, unspecified: Secondary | ICD-10-CM | POA: Diagnosis not present

## 2021-05-09 DIAGNOSIS — R069 Unspecified abnormalities of breathing: Secondary | ICD-10-CM | POA: Diagnosis not present

## 2021-05-09 DIAGNOSIS — G9389 Other specified disorders of brain: Secondary | ICD-10-CM | POA: Diagnosis present

## 2021-05-09 DIAGNOSIS — I5042 Chronic combined systolic (congestive) and diastolic (congestive) heart failure: Secondary | ICD-10-CM | POA: Diagnosis present

## 2021-05-09 DIAGNOSIS — Z818 Family history of other mental and behavioral disorders: Secondary | ICD-10-CM

## 2021-05-09 DIAGNOSIS — I771 Stricture of artery: Secondary | ICD-10-CM | POA: Diagnosis present

## 2021-05-09 DIAGNOSIS — I130 Hypertensive heart and chronic kidney disease with heart failure and stage 1 through stage 4 chronic kidney disease, or unspecified chronic kidney disease: Secondary | ICD-10-CM | POA: Diagnosis not present

## 2021-05-09 DIAGNOSIS — K649 Unspecified hemorrhoids: Secondary | ICD-10-CM | POA: Diagnosis present

## 2021-05-09 DIAGNOSIS — N184 Chronic kidney disease, stage 4 (severe): Secondary | ICD-10-CM | POA: Diagnosis not present

## 2021-05-09 DIAGNOSIS — I2510 Atherosclerotic heart disease of native coronary artery without angina pectoris: Secondary | ICD-10-CM | POA: Diagnosis not present

## 2021-05-09 DIAGNOSIS — I480 Paroxysmal atrial fibrillation: Secondary | ICD-10-CM | POA: Diagnosis present

## 2021-05-09 DIAGNOSIS — Z7902 Long term (current) use of antithrombotics/antiplatelets: Secondary | ICD-10-CM

## 2021-05-09 DIAGNOSIS — Z8679 Personal history of other diseases of the circulatory system: Secondary | ICD-10-CM | POA: Diagnosis not present

## 2021-05-09 DIAGNOSIS — Z20822 Contact with and (suspected) exposure to covid-19: Secondary | ICD-10-CM | POA: Diagnosis present

## 2021-05-09 DIAGNOSIS — H9190 Unspecified hearing loss, unspecified ear: Secondary | ICD-10-CM | POA: Diagnosis present

## 2021-05-09 DIAGNOSIS — J069 Acute upper respiratory infection, unspecified: Secondary | ICD-10-CM | POA: Diagnosis present

## 2021-05-09 DIAGNOSIS — R413 Other amnesia: Secondary | ICD-10-CM | POA: Diagnosis present

## 2021-05-09 DIAGNOSIS — Z96652 Presence of left artificial knee joint: Secondary | ICD-10-CM | POA: Diagnosis present

## 2021-05-09 DIAGNOSIS — Z888 Allergy status to other drugs, medicaments and biological substances status: Secondary | ICD-10-CM

## 2021-05-09 DIAGNOSIS — R0602 Shortness of breath: Secondary | ICD-10-CM | POA: Diagnosis not present

## 2021-05-09 DIAGNOSIS — E1122 Type 2 diabetes mellitus with diabetic chronic kidney disease: Secondary | ICD-10-CM | POA: Diagnosis present

## 2021-05-09 DIAGNOSIS — Z823 Family history of stroke: Secondary | ICD-10-CM

## 2021-05-09 DIAGNOSIS — Z79899 Other long term (current) drug therapy: Secondary | ICD-10-CM

## 2021-05-09 DIAGNOSIS — I25708 Atherosclerosis of coronary artery bypass graft(s), unspecified, with other forms of angina pectoris: Secondary | ICD-10-CM

## 2021-05-09 DIAGNOSIS — E1151 Type 2 diabetes mellitus with diabetic peripheral angiopathy without gangrene: Secondary | ICD-10-CM | POA: Diagnosis present

## 2021-05-09 DIAGNOSIS — Z951 Presence of aortocoronary bypass graft: Secondary | ICD-10-CM | POA: Diagnosis not present

## 2021-05-09 DIAGNOSIS — N179 Acute kidney failure, unspecified: Secondary | ICD-10-CM

## 2021-05-09 DIAGNOSIS — G8929 Other chronic pain: Secondary | ICD-10-CM | POA: Diagnosis present

## 2021-05-09 DIAGNOSIS — R062 Wheezing: Secondary | ICD-10-CM | POA: Diagnosis not present

## 2021-05-09 DIAGNOSIS — E039 Hypothyroidism, unspecified: Secondary | ICD-10-CM | POA: Diagnosis not present

## 2021-05-09 DIAGNOSIS — Z833 Family history of diabetes mellitus: Secondary | ICD-10-CM

## 2021-05-09 DIAGNOSIS — Z8042 Family history of malignant neoplasm of prostate: Secondary | ICD-10-CM

## 2021-05-09 DIAGNOSIS — T465X5A Adverse effect of other antihypertensive drugs, initial encounter: Secondary | ICD-10-CM | POA: Diagnosis present

## 2021-05-09 DIAGNOSIS — E669 Obesity, unspecified: Secondary | ICD-10-CM | POA: Diagnosis present

## 2021-05-09 DIAGNOSIS — I248 Other forms of acute ischemic heart disease: Secondary | ICD-10-CM

## 2021-05-09 DIAGNOSIS — J80 Acute respiratory distress syndrome: Secondary | ICD-10-CM | POA: Diagnosis not present

## 2021-05-09 DIAGNOSIS — M47816 Spondylosis without myelopathy or radiculopathy, lumbar region: Secondary | ICD-10-CM | POA: Diagnosis present

## 2021-05-09 DIAGNOSIS — R0902 Hypoxemia: Secondary | ICD-10-CM | POA: Diagnosis not present

## 2021-05-09 DIAGNOSIS — Z8719 Personal history of other diseases of the digestive system: Secondary | ICD-10-CM

## 2021-05-09 DIAGNOSIS — R778 Other specified abnormalities of plasma proteins: Secondary | ICD-10-CM | POA: Diagnosis present

## 2021-05-09 DIAGNOSIS — H8110 Benign paroxysmal vertigo, unspecified ear: Secondary | ICD-10-CM | POA: Diagnosis present

## 2021-05-09 DIAGNOSIS — G4733 Obstructive sleep apnea (adult) (pediatric): Secondary | ICD-10-CM | POA: Diagnosis present

## 2021-05-09 DIAGNOSIS — E782 Mixed hyperlipidemia: Secondary | ICD-10-CM | POA: Diagnosis present

## 2021-05-09 DIAGNOSIS — Z7982 Long term (current) use of aspirin: Secondary | ICD-10-CM

## 2021-05-09 DIAGNOSIS — Z8249 Family history of ischemic heart disease and other diseases of the circulatory system: Secondary | ICD-10-CM

## 2021-05-09 DIAGNOSIS — Z885 Allergy status to narcotic agent status: Secondary | ICD-10-CM

## 2021-05-09 DIAGNOSIS — Z800 Family history of malignant neoplasm of digestive organs: Secondary | ICD-10-CM

## 2021-05-09 DIAGNOSIS — Z83438 Family history of other disorder of lipoprotein metabolism and other lipidemia: Secondary | ICD-10-CM

## 2021-05-09 DIAGNOSIS — R4189 Other symptoms and signs involving cognitive functions and awareness: Secondary | ICD-10-CM | POA: Diagnosis present

## 2021-05-09 DIAGNOSIS — H409 Unspecified glaucoma: Secondary | ICD-10-CM | POA: Diagnosis present

## 2021-05-09 HISTORY — DX: Chronic kidney disease, stage 4 (severe): N18.4

## 2021-05-09 HISTORY — DX: Paroxysmal atrial fibrillation: I48.0

## 2021-05-09 LAB — COMPREHENSIVE METABOLIC PANEL
ALT: 11 U/L (ref 0–44)
AST: 16 U/L (ref 15–41)
Albumin: 3.5 g/dL (ref 3.5–5.0)
Alkaline Phosphatase: 66 U/L (ref 38–126)
Anion gap: 11 (ref 5–15)
BUN: 20 mg/dL (ref 8–23)
CO2: 22 mmol/L (ref 22–32)
Calcium: 8.9 mg/dL (ref 8.9–10.3)
Chloride: 105 mmol/L (ref 98–111)
Creatinine, Ser: 2.2 mg/dL — ABNORMAL HIGH (ref 0.44–1.00)
GFR, Estimated: 22 mL/min — ABNORMAL LOW (ref >60)
Glucose, Bld: 170 mg/dL — ABNORMAL HIGH (ref 70–99)
Potassium: 3.4 mmol/L — ABNORMAL LOW (ref 3.5–5.1)
Sodium: 138 mmol/L (ref 135–145)
Total Bilirubin: 1.1 mg/dL (ref 0.3–1.2)
Total Protein: 6.2 g/dL — ABNORMAL LOW (ref 6.5–8.1)

## 2021-05-09 LAB — ECHOCARDIOGRAM COMPLETE
Area-P 1/2: 4.36 cm2
Calc EF: 50.2 %
Height: 61 in
S' Lateral: 2.6 cm
Single Plane A2C EF: 45.5 %
Single Plane A4C EF: 55.5 %
Weight: 2960 oz

## 2021-05-09 LAB — CBC WITH DIFFERENTIAL/PLATELET
Abs Immature Granulocytes: 0.05 10*3/uL (ref 0.00–0.07)
Basophils Absolute: 0 10*3/uL (ref 0.0–0.1)
Basophils Relative: 0 %
Eosinophils Absolute: 0 10*3/uL (ref 0.0–0.5)
Eosinophils Relative: 0 %
HCT: 39.6 % (ref 36.0–46.0)
Hemoglobin: 12.3 g/dL (ref 12.0–15.0)
Immature Granulocytes: 1 %
Lymphocytes Relative: 17 %
Lymphs Abs: 1.5 10*3/uL (ref 0.7–4.0)
MCH: 27.5 pg (ref 26.0–34.0)
MCHC: 31.1 g/dL (ref 30.0–36.0)
MCV: 88.6 fL (ref 80.0–100.0)
Monocytes Absolute: 0.5 10*3/uL (ref 0.1–1.0)
Monocytes Relative: 6 %
Neutro Abs: 6.8 10*3/uL (ref 1.7–7.7)
Neutrophils Relative %: 76 %
Platelets: 151 10*3/uL (ref 150–400)
RBC: 4.47 MIL/uL (ref 3.87–5.11)
RDW: 13.9 % (ref 11.5–15.5)
WBC: 8.9 10*3/uL (ref 4.0–10.5)
nRBC: 0 % (ref 0.0–0.2)

## 2021-05-09 LAB — HEPARIN LEVEL (UNFRACTIONATED): Heparin Unfractionated: 0.28 IU/mL — ABNORMAL LOW (ref 0.30–0.70)

## 2021-05-09 LAB — BASIC METABOLIC PANEL
Anion gap: 14 (ref 5–15)
BUN: 21 mg/dL (ref 8–23)
CO2: 21 mmol/L — ABNORMAL LOW (ref 22–32)
Calcium: 9.2 mg/dL (ref 8.9–10.3)
Chloride: 104 mmol/L (ref 98–111)
Creatinine, Ser: 2.09 mg/dL — ABNORMAL HIGH (ref 0.44–1.00)
GFR, Estimated: 24 mL/min — ABNORMAL LOW (ref >60)
Glucose, Bld: 215 mg/dL — ABNORMAL HIGH (ref 70–99)
Potassium: 4.3 mmol/L (ref 3.5–5.1)
Sodium: 139 mmol/L (ref 135–145)

## 2021-05-09 LAB — TROPONIN I (HIGH SENSITIVITY)
Troponin I (High Sensitivity): 45 ng/L — ABNORMAL HIGH (ref <18)
Troponin I (High Sensitivity): 137 ng/L (ref <18)
Troponin I (High Sensitivity): 375 ng/L (ref <18)

## 2021-05-09 LAB — RESP PANEL BY RT-PCR (FLU A&B, COVID) ARPGX2
Influenza A by PCR: NEGATIVE
Influenza B by PCR: NEGATIVE
SARS Coronavirus 2 by RT PCR: NEGATIVE

## 2021-05-09 LAB — BRAIN NATRIURETIC PEPTIDE: B Natriuretic Peptide: 611 pg/mL — ABNORMAL HIGH (ref 0.0–100.0)

## 2021-05-09 LAB — MAGNESIUM: Magnesium: 1.8 mg/dL (ref 1.7–2.4)

## 2021-05-09 LAB — D-DIMER, QUANTITATIVE: D-Dimer, Quant: 1.66 ug/mL-FEU — ABNORMAL HIGH (ref 0.00–0.50)

## 2021-05-09 MED ORDER — POTASSIUM CHLORIDE CRYS ER 20 MEQ PO TBCR: 20.0000 meq | EXTENDED_RELEASE_TABLET | Freq: Once | ORAL | Status: DC

## 2021-05-09 MED ORDER — FUROSEMIDE 10 MG/ML IJ SOLN
40.0000 mg | Freq: Once | INTRAMUSCULAR | Status: AC
  Administered 2021-05-09: 40 mg via INTRAVENOUS
  Filled 2021-05-09: qty 4

## 2021-05-09 MED ORDER — BENZONATATE 100 MG PO CAPS
100.0000 mg | ORAL_CAPSULE | Freq: Once | ORAL | Status: AC
  Administered 2021-05-09: 100 mg via ORAL
  Filled 2021-05-09: qty 1

## 2021-05-09 MED ORDER — PANTOPRAZOLE SODIUM 40 MG PO TBEC
40.0000 mg | DELAYED_RELEASE_TABLET | Freq: Every day | ORAL | Status: DC
  Administered 2021-05-09 – 2021-05-11 (×3): 40 mg via ORAL
  Filled 2021-05-09 (×3): qty 1

## 2021-05-09 MED ORDER — GUAIFENESIN-DM 100-10 MG/5ML PO SYRP: 5.0000 mL | ORAL_SOLUTION | ORAL | Status: DC | PRN

## 2021-05-09 MED ORDER — GABAPENTIN 100 MG PO CAPS
100.0000 mg | ORAL_CAPSULE | Freq: Two times a day (BID) | ORAL | Status: DC
  Administered 2021-05-09 – 2021-05-11 (×5): 100 mg via ORAL
  Filled 2021-05-09 (×5): qty 1

## 2021-05-09 MED ORDER — CLOPIDOGREL BISULFATE 75 MG PO TABS
75.0000 mg | ORAL_TABLET | Freq: Every day | ORAL | Status: DC
  Administered 2021-05-09 – 2021-05-11 (×3): 75 mg via ORAL
  Filled 2021-05-09 (×3): qty 1

## 2021-05-09 MED ORDER — LATANOPROST 0.005 % OP SOLN
1.0000 [drp] | Freq: Every day | OPHTHALMIC | Status: DC
  Administered 2021-05-09 – 2021-05-10 (×2): 1 [drp] via OPHTHALMIC
  Filled 2021-05-09: qty 2.5

## 2021-05-09 MED ORDER — ESCITALOPRAM OXALATE 10 MG PO TABS
20.0000 mg | ORAL_TABLET | Freq: Every day | ORAL | Status: DC
  Administered 2021-05-09 – 2021-05-11 (×3): 20 mg via ORAL
  Filled 2021-05-09 (×3): qty 2

## 2021-05-09 MED ORDER — POTASSIUM CHLORIDE CRYS ER 20 MEQ PO TBCR
40.0000 meq | EXTENDED_RELEASE_TABLET | Freq: Once | ORAL | Status: AC
  Administered 2021-05-09: 40 meq via ORAL
  Filled 2021-05-09: qty 2

## 2021-05-09 MED ORDER — ROSUVASTATIN CALCIUM 20 MG PO TABS
40.0000 mg | ORAL_TABLET | Freq: Every day | ORAL | Status: DC
  Administered 2021-05-09 – 2021-05-10 (×2): 40 mg via ORAL
  Filled 2021-05-09 (×2): qty 2

## 2021-05-09 MED ORDER — PERFLUTREN LIPID MICROSPHERE
1.0000 mL | INTRAVENOUS | Status: AC | PRN
  Administered 2021-05-09: 2 mL via INTRAVENOUS
  Filled 2021-05-09: qty 10

## 2021-05-09 MED ORDER — DORZOLAMIDE HCL 2 % OP SOLN
1.0000 [drp] | Freq: Two times a day (BID) | OPHTHALMIC | Status: DC
  Administered 2021-05-09 – 2021-05-11 (×4): 1 [drp] via OPHTHALMIC
  Filled 2021-05-09: qty 10

## 2021-05-09 MED ORDER — DIVALPROEX SODIUM ER 250 MG PO TB24
250.0000 mg | ORAL_TABLET | Freq: Every day | ORAL | Status: DC
  Administered 2021-05-09 – 2021-05-10 (×2): 250 mg via ORAL
  Filled 2021-05-09 (×3): qty 1

## 2021-05-09 MED ORDER — METOPROLOL TARTRATE 12.5 MG HALF TABLET
12.5000 mg | ORAL_TABLET | Freq: Two times a day (BID) | ORAL | Status: DC
  Administered 2021-05-09 – 2021-05-11 (×4): 12.5 mg via ORAL
  Filled 2021-05-09 (×5): qty 1

## 2021-05-09 MED ORDER — ALBUTEROL SULFATE (2.5 MG/3ML) 0.083% IN NEBU: 2.5000 mg | INHALATION_SOLUTION | Freq: Four times a day (QID) | RESPIRATORY_TRACT | Status: DC | PRN

## 2021-05-09 MED ORDER — HEPARIN BOLUS VIA INFUSION
4000.0000 [IU] | Freq: Once | INTRAVENOUS | Status: AC
  Administered 2021-05-09: 4000 [IU] via INTRAVENOUS
  Filled 2021-05-09: qty 4000

## 2021-05-09 MED ORDER — ALPRAZOLAM 0.5 MG PO TABS
0.5000 mg | ORAL_TABLET | Freq: Every day | ORAL | Status: DC | PRN
  Administered 2021-05-09: 0.5 mg via ORAL
  Filled 2021-05-09: qty 1

## 2021-05-09 MED ORDER — LEVOTHYROXINE SODIUM 75 MCG PO TABS
175.0000 ug | ORAL_TABLET | Freq: Every day | ORAL | Status: DC
  Administered 2021-05-09 – 2021-05-11 (×3): 175 ug via ORAL
  Filled 2021-05-09 (×3): qty 1

## 2021-05-09 MED ORDER — ASPIRIN EC 81 MG PO TBEC
81.0000 mg | DELAYED_RELEASE_TABLET | Freq: Every day | ORAL | Status: DC
  Administered 2021-05-09 – 2021-05-10 (×2): 81 mg via ORAL
  Filled 2021-05-09 (×2): qty 1

## 2021-05-09 MED ORDER — HEPARIN (PORCINE) 25000 UT/250ML-% IV SOLN
1300.0000 [IU]/h | INTRAVENOUS | Status: DC
  Administered 2021-05-09: 950 [IU]/h via INTRAVENOUS
  Administered 2021-05-10: 1300 [IU]/h via INTRAVENOUS
  Filled 2021-05-09 (×2): qty 250

## 2021-05-09 NOTE — Progress Notes (Addendum)
     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  On arriving to the emergency department her chest pain had resolved.. When EMS arrived patient was noted to be wheezing and gave her Solu-Medrol and albuterol when she arrived in the ED she was slightly tachypneic and her oxygen saturation have dropped to the mid 80s on room air at rest she was therefore placed on 2 L supplemental O2.  Her initial troponin was 40 5 repeat increased to 137.  EKG showed repolarization abnormality in the lateral leads, chest x-ray without acute abnormality.  She does have hyperglycemia with glucose of 215 and creatinine is 2.09 ,potassium was 3.4 and it has been placed we will recheck in the morning Her D-dimer is 1.66.  I will order VQ scan 2D echo has been done.  Patient is currently on IV heparin  Patient seen and examined.  She denies any more chest pain she still n.p.o. and would like to eat I have called cardiology for consultation .

## 2021-05-09 NOTE — ED Notes (Signed)
Provider at bedside

## 2021-05-09 NOTE — ED Notes (Signed)
Pt wants to eat. MD messaged at this time to put appropriate diet if pt can eat.

## 2021-05-09 NOTE — ED Notes (Signed)
Admitting paged to RN per her request 

## 2021-05-09 NOTE — H&P (Signed)
History and Physical    KASIYA BURCK [name]:[id number] DOB: 03/08/1941 DOA: 05/09/2021  PCP: Lauree Chandler, NP Patient coming from: Home  Chief Complaint: Shortness of breath  HPI: Deanna [name] is a 80 y.o. female with medical history significant of CAD status post CABG and PCI, chronic combined CHF, paroxysmal A. fib not on anticoagulation, PAD with left to right femorofemoral crossover, left subclavian artery stenosis, left common carotid to subclavian artery bypass, celiac artery stenosis, CKD stage IIIb, hypothyroidism, GERD, hypertension, hyperlipidemia, hypothyroidism, OSA on CPAP, depression, anxiety presented to the ED via EMS evaluation of respiratory distress.  EMS reported wheezing and gave her Solu-Medrol 125 mg, Atrovent, and albuterol.  In the ED, having spells of coughing.  Slightly tachycardic.  Oxygen saturation dropped to the mid 80s on room air at rest, placed on 2 L supplemental oxygen.  Labs showing no leukocytosis or anemia.  Potassium 3.4.  Creatinine 2.2, baseline around 1.5.  BNP 611.  Initial high-sensitivity troponin 45, repeat pending.  EKG without acute ischemic changes.  D-dimer pending.  COVID and influenza PCR negative.  Chest x-ray negative for acute abnormality. Patient was given Tessalon.  Patient is a poor historian.  States she had a severe cold and sinus infection about 2 or 3 weeks ago.  No longer having sinus pain/pressure but has continued to cough a lot for the past 2 weeks.  She was initially coughing up green sputum but it is not green in color anymore.  Denies fevers or wheezing.  Tonight she had sudden onset shortness of breath and chest tightness which prompted her to call EMS.  No longer having chest pain or tightness.  Denies history of asthma or COPD.  Denies history of blood clots.  States she was previously using CPAP for sleep apnea but stopped using it 3 years ago.  Review of Systems:  All systems reviewed and apart from history of presenting  illness, are negative.  Past Medical History:  Diagnosis Date   AMI 11/17/2008   Qualifier: Diagnosis of  By: Marland Mcalpine     Anxiety    Atherosclerotic peripheral vascular disease ([place])    Bacteremia, escherichia coli 04/27/2015   Brachial-basilar insufficiency syndrome    CAD (coronary artery disease)    followed by dr [name].   Celiac artery stenosis (HCC)    CHF (congestive heart failure) ([place]) 11/14/2017   Chronic bilateral low back pain without sciatica    Cognitive impairment    Colon polyps    Community acquired pneumonia    Depression    Diverticulosis    Dysuria    Encephalomalacia    GAD (generalized anxiety disorder)    GERD (gastroesophageal reflux disease)    Glaucoma    Hearing loss    Hemorrhoids    Hypertension    Hypothyroidism    Incontinence    Mixed hyperlipidemia    Myocardial infarction (HCC)    OSA on CPAP    Peripheral arterial disease ([place])    Pneumonitis 04/26/2015   Pyelonephritis    Pyelonephritis due to Escherichia coli 04/28/2015   Sepsis ([place])    Sinus drainage    took z-pack   finished yesterday   Spondylosis of lumbar region without myelopathy or radiculopathy    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[place])    Urticaria    Vertigo, benign positional     Past Surgical History:  Procedure Laterality Date   ABDOMINAL AORTOGRAM W/LOWER EXTREMITY N/A 08/22/2017   Procedure: ABDOMINAL AORTOGRAM  W/LOWER EXTREMITY;  Surgeon: Serafina Mitchell, MD;  Location: [place] [facility];  Service: Cardiovascular;  Laterality: N/A;   BILATERAL UPPER EXTREMITY ANGIOGRAM N/A 09/11/2012   Procedure: BILATERAL UPPER EXTREMITY ANGIOGRAM;  Surgeon: Serafina Mitchell, MD;  Location: [facility] CATH LAB;  Service: Cardiovascular;  Laterality: N/A;   BIOPSY  03/25/2020   Procedure: BIOPSY;  Surgeon: Irving Copas., MD;  Location: [place];  Service: Gastroenterology;;   carotid duplex doppler Bilateral 09/03/2012, 11/03/2011   Evidence of 40%-59% bilateral internal  carotid artery stenosis; however, velocities may be underestimated due to calcific plaque with acoustic shadowing which makes doppler interrogation difficult. patent left common carotid- subclavian artery bypass with turbulent flow noted at the anastomosis with velocities of 295 cm/s   CAROTID-SUBCLAVIAN BYPASS GRAFT  12/15/2011   Procedure: BYPASS GRAFT CAROTID-SUBCLAVIAN;  Surgeon: Serafina Mitchell, MD;  Location: [facility] OR;  Service: Vascular;  Laterality: Left;  Left Carotid subclavian bypass   CORONARY ANGIOPLASTY     CORONARY ARTERY BYPASS GRAFT     CORONARY ATHERECTOMY N/A 03/19/2020   Procedure: CORONARY ATHERECTOMY;  Surgeon: Jettie Booze, MD;  Location: [place] [facility];  Service: Cardiovascular;  Laterality: N/A;   CORONARY STENT INTERVENTION N/A 03/19/2020   Procedure: CORONARY STENT INTERVENTION;  Surgeon: Jettie Booze, MD;  Location: [place] [facility];  Service: Cardiovascular;  Laterality: N/A;   DOPPLER ECHOCARDIOGRAPHY  05/27/2010, 09/17/2008   Mild Proximal septal thickening is noted. Left ventricular systolic functions is normal ejection fraction =>55%. the aortic valve appears to be mildly sclerotic    ESOPHAGOGASTRODUODENOSCOPY (EGD) WITH PROPOFOL N/A 03/25/2020   Procedure: ESOPHAGOGASTRODUODENOSCOPY (EGD) WITH PROPOFOL;  Surgeon: Rush Landmark Telford Nab., MD;  Location: [place];  Service: Gastroenterology;  Laterality: N/A;   fem-fem bypass graft  1999   holter monitor  01/21/2008   The predominant rhythm was normal sinus rhythm. Minimum heartrate of 63 bpm at +01:00, maximum heartrate of 105 bpm at + 10:35; and the average heartrate of 75 bpm. Ventricular ectopic activity totaled 1270: Multifocal; 866-PVC's and 404-VEs              JOINT REPLACEMENT     Left knee   LEFT HEART CATH AND CORS/GRAFTS ANGIOGRAPHY N/A 03/19/2020   Procedure: LEFT HEART CATH AND CORS/GRAFTS ANGIOGRAPHY;  Surgeon: Jettie Booze, MD;  Location: [place] [facility];  Service:  Cardiovascular;  Laterality: N/A;   LEFT HEART CATHETERIZATION WITH CORONARY/GRAFT ANGIOGRAM N/A 12/21/2011   Procedure: LEFT HEART CATHETERIZATION WITH Beatrix Fetters;  Surgeon: Leonie Man, MD;  Location[facility] CATH LAB;  Service: Cardiovascular;  Laterality: N/A;   NM MYOCAR PERF EJECTION FRACTION  09/22/2009, 07/03/2007   the post stress myocardial perfusion images show a normal pattern of perfusion is all regions. The post-stress ejection fraction is 68 %. no significant wall motion abnormalities noted. This is a low risk scan.   PERIPHERAL VASCULAR INTERVENTION  08/22/2017   Procedure: PERIPHERAL VASCULAR INTERVENTION;  Surgeon: Serafina Mitchell, MD;  Location: [place] [facility];  Service: Cardiovascular;;  Fem/Fem Graft   REPLACEMENT TOTAL KNEE  05-2011   UNILATERAL UPPER EXTREMEITY ANGIOGRAM Left 11/15/2011   Procedure: UNILATERAL UPPER Anselmo Rod;  Surgeon: Lorretta Harp, MD;  Location: [facility] CATH LAB;  Service: Cardiovascular;  Laterality: Left;     reports that she quit smoking about 39 years ago. Her smoking use included cigarettes. She has never used smokeless tobacco. She reports that she does not currently use alcohol. She reports  that she does not use drugs.  Allergies  Allergen Reactions   Donepezil Other (See Comments)    Altered mood, aggression, and caused anger   Duloxetine Hcl     Increased confusion and memory concerns    Meloxicam     Pt suspects that this medicine causes fluid on her lungs.    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    Vicodin [Hydrocodone-Acetaminophen] Other (See Comments)    Delirium, confusion, and toxic dementia    Family History  Problem Relation Age of Onset   Heart attack Mother    Heart disease Mother        before age 11   Diabetes Father    Heart disease Father    Hypertension Father    Hyperlipidemia  Father    Heart attack Father 93   Heart attack Brother 22   Cerebral palsy Sister 90   Congestive Heart Failure Sister 61   Heart attack Sister 47   Hypertension Sister 37   Dementia Sister    Anxiety disorder Sister    Anxiety disorder Sister 60   Heart Problems Sister    Stroke Sister    Heart Problems Sister 30   Heart attack Sister 42   Colon cancer Brother 62   Prostate cancer Brother 56   Hypertension Daughter    Irritable bowel syndrome Daughter    Depression Daughter    Anxiety disorder Daughter     Prior to Admission medications   Medication Sig Start Date End Date Taking? Authorizing Provider  acetaminophen (TYLENOL) 325 MG tablet Take 325 mg by mouth in the morning and at bedtime. 1 by mouth in the morning and 1 by mouth in the pm.   Yes [provider]  ALPRAZolam (XANAX) 0.5 MG tablet Take 1 tablet (0.5 mg total) by mouth daily as needed for anxiety. 11/25/20  Yes Thayer Headings, PMHNP  aspirin EC 81 MG tablet Take 81 mg by mouth at bedtime.    Yes [provider]  cholecalciferol (VITAMIN D) 400 units TABS tablet Take 400 Units by mouth daily.   Yes [provider]  clopidogrel (PLAVIX) 75 MG tablet TAKE 1 TABLET(75 MG) BY MOUTH DAILY Patient taking differently: Take 75 mg by mouth daily. 02/23/21  Yes Lauree Chandler, NP  divalproex (DEPAKOTE ER) 250 MG 24 hr tablet Take 1 tablet (250 mg total) by mouth at bedtime. 11/25/20  Yes Thayer Headings, PMHNP  dorzolamide (TRUSOPT) 2 % ophthalmic solution Place 1 drop into both eyes 2 (two) times daily.   Yes [provider]  escitalopram (LEXAPRO) 20 MG tablet Take 1 tablet (20 mg total) by mouth daily. 11/25/20  Yes Thayer Headings, PMHNP  FARXIGA 10 MG TABS tablet TAKE 1 TABLET(10 MG) BY MOUTH DAILY Patient taking differently: Take 10 mg by mouth daily. 03/22/21  Yes Lauree Chandler, NP  furosemide (LASIX) 40 MG tablet TAKE 1 TABLET(40 MG) BY MOUTH DAILY Patient taking differently:  Take 40 mg by mouth daily. 01/25/21  Yes Lauree Chandler, NP  gabapentin (NEURONTIN) 100 MG capsule TAKE 2 CAPSULES(200 MG) BY MOUTH DAILY Patient taking differently: Take 100 mg by mouth 2 (two) times daily. 01/05/21  Yes Lauree Chandler, NP  latanoprost (XALATAN) 0.005 % ophthalmic solution Place 1 drop into both eyes at bedtime.   Yes [provider]  levothyroxine (  SYNTHROID) 175 MCG tablet Take 1 tablet (175 mcg total) by mouth daily. Take at the same time daily separate from other medications 04/13/21  Yes Lauree Chandler, NP  meclizine (ANTIVERT) 25 MG tablet Take 1 tablet (25 mg total) by mouth as needed for dizziness. 11/30/18  Yes Lauree Chandler, NP  metoprolol succinate (TOPROL-XL) 25 MG 24 hr tablet Take 1 tablet (25 mg total) by mouth daily. Patient taking differently: Take 12.5 mg by mouth in the morning and at bedtime. 04/01/20  Yes Furth, Cadence H, PA-C  nitroGLYCERIN (NITROSTAT) 0.4 MG SL tablet Place 1 tablet (0.4 mg total) under the tongue every 5 (five) minutes x 3 doses as needed for chest pain. 03/31/20  Yes Katsadouros, Vasilios, MD  pantoprazole (PROTONIX) 40 MG tablet TAKE 1 TABLET EVERY DAY Patient taking differently: Take 40 mg by mouth daily. 02/11/21  Yes Lauree Chandler, NP  rosuvastatin (CRESTOR) 40 MG tablet TAKE 1 TABLET(40 MG) BY MOUTH DAILY Patient taking differently: Take 40 mg by mouth at bedtime. 10/01/20  Yes Lauree Chandler, NP  tiZANidine (ZANAFLEX) 2 MG tablet Take 1 tablet (2 mg total) by mouth every 6 (six) hours as needed for muscle spasms. 07/01/19  Yes Ngetich, Dinah C, NP  valsartan (DIOVAN) 80 MG tablet TAKE 1 TABLET EVERY MORNING Patient taking differently: Take 80 mg by mouth daily. 10/16/20  Yes Lauree Chandler, NP  vitamin B-12 (CYANOCOBALAMIN) 1000 MCG tablet Take 1,000 mcg by mouth daily.   Yes [provider]    Physical Exam: Vitals:   05/09/21 0435 05/09/21 0458 05/09/21 0515 05/09/21 0600  BP: 106/70 (!)  117/45 112/60 (!) 101/56  Pulse: 99 95 94 95  Resp: (!) 25 (!) 21 17 19   SpO2: 95% 93% 94% 94%  Weight:      Height:        Physical Exam Constitutional:      General: She is not in acute distress. HENT:     Head: Normocephalic and atraumatic.  Eyes:     Extraocular Movements: Extraocular movements intact.     Conjunctiva/sclera: Conjunctivae normal.  Cardiovascular:     Rate and Rhythm: Normal rate and regular rhythm.     Pulses: Normal pulses.  Pulmonary:     Effort: Pulmonary effort is normal. No respiratory distress.     Breath sounds: No wheezing.     Comments: Mild bibasilar rales Abdominal:     General: Bowel sounds are normal. There is no distension.     Palpations: Abdomen is soft.     Tenderness: There is no abdominal tenderness.  Musculoskeletal:        General: No swelling or tenderness.     Cervical back: Normal range of motion and neck supple.  Skin:    General: Skin is warm and dry.  Neurological:     General: No focal deficit present.     Mental Status: She is alert and oriented to person, place, and time.     Labs on Admission: I have personally reviewed following labs and imaging studies  CBC: Recent Labs  Lab 05/09/21 0042  WBC 8.9  NEUTROABS 6.8  HGB 12.3  HCT 39.6  MCV 88.6  PLT 948   Basic Metabolic Panel: Recent Labs  Lab 05/09/21 0042  NA 138  K 3.4*  CL 105  CO2 22  GLUCOSE 170*  BUN 20  CREATININE 2.20*  CALCIUM 8.9   GFR: Estimated Creatinine Clearance: 20.4 mL/min (A) (by C-G formula  based on SCr of 2.2 mg/dL (H)). Liver Function Tests: Recent Labs  Lab 05/09/21 0042  AST 16  ALT 11  ALKPHOS 66  BILITOT 1.1  PROT 6.2*  ALBUMIN 3.5   No results for input(s): LIPASE, AMYLASE in the last 168 hours. No results for input(s): AMMONIA in the last 168 hours. Coagulation Profile: No results for input(s): INR, PROTIME in the last 168 hours. Cardiac Enzymes: No results for input(s): CKTOTAL, CKMB, CKMBINDEX, TROPONINI  in the last 168 hours. BNP (last 3 results) No results for input(s): PROBNP in the last 8760 hours. HbA1C: No results for input(s): HGBA1C in the last 72 hours. CBG: No results for input(s): GLUCAP in the last 168 hours. Lipid Profile: No results for input(s): CHOL, HDL, LDLCALC, TRIG, CHOLHDL, LDLDIRECT in the last 72 hours. Thyroid Function Tests: No results for input(s): TSH, T4TOTAL, FREET4, T3FREE, THYROIDAB in the last 72 hours. Anemia Panel: No results for input(s): VITAMINB12, FOLATE, FERRITIN, TIBC, IRON, RETICCTPCT in the last 72 hours. Urine analysis:    Component Value Date/Time   COLORURINE YELLOW 04/26/2015 0811   APPEARANCEUR CLEAR 04/26/2015 0811   LABSPEC <1.005 (L) 04/26/2015 0811   PHURINE 6.0 04/26/2015 0811   GLUCOSEU NEGATIVE 04/26/2015 0811   HGBUR SMALL (A) 04/26/2015 0811   BILIRUBINUR NEGATIVE 04/26/2015 0811   KETONESUR NEGATIVE 04/26/2015 0811   PROTEINUR NEGATIVE 04/26/2015 0811   UROBILINOGEN 0.2 04/26/2015 0811   NITRITE NEGATIVE 04/26/2015 0811   LEUKOCYTESUR MODERATE (A) 04/26/2015 0811    Radiological Exams on Admission: DG Chest Port 1 View  Result Date: 05/09/2021 CLINICAL DATA:  Shortness of breath EXAM: PORTABLE CHEST 1 VIEW COMPARISON:  03/25/2020 FINDINGS: Cardiac shadow is within normal limits. Postsurgical changes are noted. Lungs are well aerated bilaterally. Previously seen airspace opacities have resolved in the interval. No new focal abnormality is seen. IMPRESSION: No acute abnormality noted. Electronically Signed   By: Inez Catalina M.D.   On: 05/09/2021 00:58    EKG: Independently reviewed.  Sinus tachycardia, ST/T wave abnormalities similar to prior tracings.  No acute ischemic changes.  Assessment/Plan Principal Problem:   NSTEMI (non-ST elevated myocardial infarction) ([place]) Active Problems:   Acute-on-chronic kidney injury ([place])   Acute hypoxemic respiratory failure (HCC)   Acute bronchitis   Hypokalemia   CAD status  post CABG and PCI Possible NSTEMI Patient here for evaluation of acute onset shortness of breath and chest tightness.  EKG without acute ischemic changes.  High-sensitivity troponin 45 > 137.  Known history of CAD status post CABG.  Admitted in August 2021 for NSTEMI and underwent PCI.  Currently chest pain-free. -IV heparin -Continue aspirin, Plavix, beta-blocker, statin -Echocardiogram -Consult cardiology in a.m.  Acute bronchitis Patient reports ongoing productive cough for the past 2 weeks in the setting of recent URI.  EMS reported wheezing.  No documented history of asthma or COPD.  Chest x-ray without signs of pneumonia.  No leukocytosis.  COVID and influenza PCR negative. -Robitussin-DM prn cough -Albuterol neb prn wheezing -Check procalcitonin level  Acute on chronic combined CHF Although chest x-ray is not showing pulmonary edema, she does have mild rales on exam and BNP elevated. Echo done August 2021 showing EF 35-40% and grade 2 diastolic dysfunction. -IV Lasix 40 mg x 1, repeat labs to check renal function before ordering additional doses of Lasix. -Repeat echocardiogram -Monitor intake and output, daily weights -Low-sodium diet with fluid restriction  Acute hypoxemic respiratory failure  Likely multifactorial in the setting of conditions listed above.  Acute PE is also on the differential.  SPO2 in the mid 80s on room air at rest, currently stable on 2 L supplemental oxygen.  -Continue supplemental oxygen, wean as tolerated -Started on IV heparin.  Check D-dimer level. If elevated, obtain VQ scan to rule out PE.  Avoid CT angiogram given renal insufficiency.  AKI on CKD stage IIIb Creatinine 2.2, baseline around 1.5.  Possibly cardiorenal from decompensated CHF. -One-time dose of IV Lasix -Repeat BMP to check renal function -Monitor urine output -Avoid any other nephrotoxic agents/contrast.  Hold home valsartan.  Mild hypokalemia -Monitor potassium and magnesium  levels, replenish as needed.  Paroxysmal A. Fib Patient had a brief course of A. fib following cardiac catheterization last year and converted to normal sinus rhythm with amiodarone.  Amiodarone subsequently stopped with no recurrence of A. fib.  Given first episode and brief duration, cardiology did not start anticoagulation.  Currently in sinus rhythm. -Continue metoprolol for rate control  GERD -Continue Protonix  Hypertension Stable. -Continue metoprolol. Hold valsartan given AKI.  Hyperlipidemia -Continue Crestor  Hypothyroidism -Continue Synthroid  Depression and anxiety -Continue home Lexapro, Xanax prn  DVT prophylaxis: Heparin Code Status: Patient wishes to be full code. Family Communication: No family available at this time. Disposition Plan: Status is: Inpatient  Remains inpatient appropriate because:Ongoing diagnostic testing needed not appropriate for outpatient work up and Inpatient level of care appropriate due to severity of illness  Dispo: The patient is from: Home              Anticipated d/c is to: Home              Patient currently is not medically stable to d/c.   Difficult to place patient No  Level of care: Level of care: Progressive  The medical decision making on this patient was of high complexity and the patient is at high risk for clinical deterioration, therefore this is a level 3 visit.  Shela Leff MD [facility]  If 7PM-7AM, please contact night-coverage [email or web address]  05/09/2021, 6:42 AM

## 2021-05-09 NOTE — ED Provider Notes (Signed)
[facility] EMERGENCY DEPARTMENT Provider Note   CSN: [id number] Arrival date & time: 05/09/21  0031     History Chief Complaint  Patient presents with   Respiratory Distress    Deanna [name] is a 80 y.o. female.  The history is provided by the patient, the EMS personnel and medical records.  Deanna [name] is a 80 y.o. female who presents to the Emergency Department complaining of respiratory distress. History is provided by the patient and EMS. She presents the emergency department by EMS for evaluation of difficulty breathing. She states that she is been unwell for the last three weeks with which she describes as a cold. She reports sore throat, nasal congestion and headache. Overall the symptoms of been mild. About three hours prior to ED arrival she abruptly became short of breath. She felt like she could not breathe. She wonders if this could of been anxiety, because it started when she was watching TV and she was upset about people suffering in [place]. EMS reports wheezes. She received duo neb, Solu-Medrol prior to ED arrival. Patient states that her breathing feels improved at time of ED arrival. No recent medication changes or missed medications.  Has a hx/o CAD s/p CABG, CKD. No hx/o DVT/PE    Past Medical History:  Diagnosis Date   AMI 11/17/2008   Qualifier: Diagnosis of  By: Marland Mcalpine     Anxiety    Atherosclerotic peripheral vascular disease ([place])    Bacteremia, escherichia coli 04/27/2015   Brachial-basilar insufficiency syndrome    CAD (coronary artery disease)    followed by dr [name].   Celiac artery stenosis (HCC)    CHF (congestive heart failure) ([place]) 11/14/2017   Chronic bilateral low back pain without sciatica    Cognitive impairment    Colon polyps    Community acquired pneumonia    Depression    Diverticulosis    Dysuria    Encephalomalacia    GAD (generalized anxiety disorder)    GERD (gastroesophageal reflux disease)     Glaucoma    Hearing loss    Hemorrhoids    Hypertension    Hypothyroidism    Incontinence    Mixed hyperlipidemia    Myocardial infarction (HCC)    OSA on CPAP    Peripheral arterial disease ([place])    Pneumonitis 04/26/2015   Pyelonephritis    Pyelonephritis due to Escherichia coli 04/28/2015   Sepsis ([place])    Sinus drainage    took z-pack   finished yesterday   Spondylosis of lumbar region without myelopathy or radiculopathy    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[place])    Urticaria    Vertigo, benign positional     Patient Active Problem List   Diagnosis Date Noted   Acute hypoxemic respiratory failure ([place]) 05/09/2021   Acute bronchitis 05/09/2021   Hypokalemia 05/09/2021   Status post coronary artery stent placement    Hypoxia    Overactive bladder 12/16/2019   a 06/17/2018   Mood disorder ([place]) 06/17/2018   Acute-on-chronic kidney injury ([place]) 11/16/2017   Acute on chronic diastolic heart failure ([place]) 11/16/2017   Hypertensive urgency 11/14/2017   Thrombocytopenia ([place]) 04/26/2015   OSA on CPAP 04/26/2015   Chronic pain 04/26/2015   Physical deconditioning 04/26/2015   Hyperlipidemia 07/02/2014   Carotid artery stenosis 03/26/2012   Pleural effusion, left 12/19/2011   NSTEMI (non-ST elevated myocardial infarction) ([place]) 12/19/2011   Shortness of breath 12/19/2011    Class: Acute  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, 2011 11/28/2011   PERSONAL HX COLONIC POLYPS 06/18/2010   Hypothyroidism 11/17/2008   Obesity, Class III, BMI 40-49.9 (morbid obesity) ([place]) 11/17/2008   ANXIETY DEPRESSION 11/17/2008   Essential hypertension 11/17/2008   Atherosclerosis of coronary artery bypass graft(s), unspecified, with other forms of angina pectoris ([place]) 11/17/2008   HEMORRHOIDS 11/17/2008   GERD 11/17/2008   IBS 11/17/2008    Past Surgical History:  Procedure Laterality Date   ABDOMINAL AORTOGRAM W/LOWER EXTREMITY N/A 08/22/2017   Procedure: ABDOMINAL AORTOGRAM  W/LOWER EXTREMITY;  Surgeon: Serafina Mitchell, MD;  Location: [facility] CV LAB;  Service: Cardiovascular;  Laterality: N/A;   BILATERAL UPPER EXTREMITY ANGIOGRAM N/A 09/11/2012   Procedure: BILATERAL UPPER EXTREMITY ANGIOGRAM;  Surgeon: Serafina Mitchell, MD;  Location: [facility] CATH LAB;  Service: Cardiovascular;  Laterality: N/A;   BIOPSY  03/25/2020   Procedure: BIOPSY;  Surgeon: Irving Copas., MD;  Location: [facility];  Service: Gastroenterology;;   carotid duplex doppler Bilateral 09/03/2012, 11/03/2011   Evidence of 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 patent left common carotid- subclavian artery bypass with turbulent flow noted at the anastomosis with velocities of 295 cm/s   CAROTID-SUBCLAVIAN BYPASS GRAFT  12/15/2011   Procedure: BYPASS GRAFT CAROTID-SUBCLAVIAN;  Surgeon: Serafina Mitchell, MD;  Location[facility] OR;  Service: Vascular;  Laterality: Left;  Left Carotid subclavian bypass   CORONARY ANGIOPLASTY     CORONARY ARTERY BYPASS GRAFT     CORONARY ATHERECTOMY N/A 03/19/2020   Procedure: CORONARY ATHERECTOMY;  Surgeon: Jettie Booze, MD;  Location: [facility] CV LAB;  Service: Cardiovascular;  Laterality: N/A;   CORONARY STENT INTERVENTION N/A 03/19/2020   Procedure: CORONARY STENT INTERVENTION;  Surgeon: Jettie Booze, MD;  Location: [facility] CV LAB;  Service: Cardiovascular;  Laterality: N/A;   DOPPLER ECHOCARDIOGRAPHY  05/27/2010, 09/17/2008   Mild Proximal septal thickening is noted. Left ventricular systolic functions is normal ejection fraction =>55%. the aortic valve appears to be mildly sclerotic    ESOPHAGOGASTRODUODENOSCOPY (EGD) WITH PROPOFOL N/A 03/25/2020   Procedure: ESOPHAGOGASTRODUODENOSCOPY (EGD) WITH PROPOFOL;  Surgeon: Rush Landmark Telford Nab., MD;  Location: [facility];  Service: Gastroenterology;   Laterality: N/A;   fem-fem bypass graft  1999   holter monitor  01/21/2008   The predominant rhythm was normal sinus rhythm. Minimum heartrate of 63 bpm at +01:00, maximum heartrate of 105 bpm at + 10:35; and the average heartrate of 75 bpm. Ventricular ectopic activity totaled 1270: Multifocal; 866-PVC's and 404-VEs              JOINT REPLACEMENT     Left knee   LEFT HEART CATH AND CORS/GRAFTS ANGIOGRAPHY N/A 03/19/2020   Procedure: LEFT HEART CATH AND CORS/GRAFTS ANGIOGRAPHY;  Surgeon: Jettie Booze, MD;  Location: [facility] CV LAB;  Service: Cardiovascular;  Laterality: N/A;   LEFT HEART CATHETERIZATION WITH CORONARY/GRAFT ANGIOGRAM N/A 12/21/2011   Procedure: LEFT HEART CATHETERIZATION WITH Beatrix Fetters;  Surgeon: Leonie Man, MD;  Location: [facility] CATH LAB;  Service: Cardiovascular;  Laterality: N/A;   NM MYOCAR PERF EJECTION FRACTION  09/22/2009, 07/03/2007   the post stress myocardial perfusion images show a normal pattern of perfusion is all regions. The post-stress ejection fraction is 68 %. no significant wall motion abnormalities noted. This is a low risk scan.   PERIPHERAL VASCULAR INTERVENTION  08/22/2017   Procedure: PERIPHERAL VASCULAR  INTERVENTION;  Surgeon: Serafina Mitchell, MD;  Location: [facility] CV LAB;  Service: Cardiovascular;;  Fem/Fem Graft   REPLACEMENT TOTAL KNEE  05-2011   UNILATERAL UPPER EXTREMEITY ANGIOGRAM Left 11/15/2011   Procedure: UNILATERAL UPPER Anselmo Rod;  Surgeon: Lorretta Harp, MD;  Location: [facility] CATH LAB;  Service: Cardiovascular;  Laterality: Left;     OB History     Gravida  1   Para  1   Term      Preterm      AB      Living  1      SAB      IAB      Ectopic      Multiple      Live Births              Family History  Problem Relation Age of Onset   Heart attack Mother    Heart disease Mother        before age 96   Diabetes Father    Heart disease Father    Hypertension Father     Hyperlipidemia Father    Heart attack Father 62   Heart attack Brother 31   Cerebral palsy Sister 45   Congestive Heart Failure Sister 72   Heart attack Sister 27   Hypertension Sister 75   Dementia Sister    Anxiety disorder Sister    Anxiety disorder Sister 55   Heart Problems Sister    Stroke Sister    Heart Problems Sister 68   Heart attack Sister 27   Colon cancer Brother 44   Prostate cancer Brother 10   Hypertension Daughter    Irritable bowel syndrome Daughter    Depression Daughter    Anxiety disorder Daughter     Social History   Tobacco Use   Smoking status: Former    Years: 3.00    Types: Cigarettes    Quit date: 11/27/1981    Years since quitting: 39.4   Smokeless tobacco: Never  Vaping Use   Vaping Use: Never used  Substance Use Topics   Alcohol use: Not Currently    Alcohol/week: 0.0 standard drinks   Drug use: No    Home Medications Prior to Admission medications   Medication Sig Start Date End Date Taking? Authorizing Provider  acetaminophen (TYLENOL) 325 MG tablet Take 325 mg by mouth in the morning and at bedtime. 1 by mouth in the morning and 1 by mouth in the pm.   Yes [provider]  ALPRAZolam (XANAX) 0.5 MG tablet Take 1 tablet (0.5 mg total) by mouth daily as needed for anxiety. 11/25/20  Yes Thayer Headings, PMHNP  aspirin EC 81 MG tablet Take 81 mg by mouth at bedtime.    Yes [provider]  cholecalciferol (VITAMIN D) 400 units TABS tablet Take 400 Units by mouth daily.   Yes [provider]  clopidogrel (PLAVIX) 75 MG tablet TAKE 1 TABLET(75 MG) BY MOUTH DAILY Patient taking differently: Take 75 mg by mouth daily. 02/23/21  Yes Lauree Chandler, NP  divalproex (DEPAKOTE ER) 250 MG 24 hr tablet Take 1 tablet (250 mg total) by mouth at bedtime. 11/25/20  Yes Thayer Headings, PMHNP  dorzolamide (TRUSOPT) 2 % ophthalmic solution Place 1 drop into both eyes 2 (two) times daily.   Yes [provider]   escitalopram (LEXAPRO) 20 MG tablet Take 1 tablet (20 mg total) by mouth daily. 11/25/20  Yes Thayer Headings, PMHNP  Wilder Glade  10 MG TABS tablet TAKE 1 TABLET(10 MG) BY MOUTH DAILY Patient taking differently: Take 10 mg by mouth daily. 03/22/21  Yes Lauree Chandler, NP  furosemide (LASIX) 40 MG tablet TAKE 1 TABLET(40 MG) BY MOUTH DAILY Patient taking differently: Take 40 mg by mouth daily. 01/25/21  Yes Lauree Chandler, NP  gabapentin (NEURONTIN) 100 MG capsule TAKE 2 CAPSULES(200 MG) BY MOUTH DAILY Patient taking differently: Take 100 mg by mouth 2 (two) times daily. 01/05/21  Yes Lauree Chandler, NP  latanoprost (XALATAN) 0.005 % ophthalmic solution Place 1 drop into both eyes at bedtime.   Yes [provider]  levothyroxine (SYNTHROID) 175 MCG tablet Take 1 tablet (175 mcg total) by mouth daily. Take at the same time daily separate from other medications 04/13/21  Yes Lauree Chandler, NP  meclizine (ANTIVERT) 25 MG tablet Take 1 tablet (25 mg total) by mouth as needed for dizziness. 11/30/18  Yes Lauree Chandler, NP  metoprolol succinate (TOPROL-XL) 25 MG 24 hr tablet Take 1 tablet (25 mg total) by mouth daily. Patient taking differently: Take 12.5 mg by mouth in the morning and at bedtime. 04/01/20  Yes Furth, Cadence H, PA-C  nitroGLYCERIN (NITROSTAT) 0.4 MG SL tablet Place 1 tablet (0.4 mg total) under the tongue every 5 (five) minutes x 3 doses as needed for chest pain. 03/31/20  Yes Katsadouros, Vasilios, MD  pantoprazole (PROTONIX) 40 MG tablet TAKE 1 TABLET EVERY DAY Patient taking differently: Take 40 mg by mouth daily. 02/11/21  Yes Lauree Chandler, NP  rosuvastatin (CRESTOR) 40 MG tablet TAKE 1 TABLET(40 MG) BY MOUTH DAILY Patient taking differently: Take 40 mg by mouth at bedtime. 10/01/20  Yes Lauree Chandler, NP  tiZANidine (ZANAFLEX) 2 MG tablet Take 1 tablet (2 mg total) by mouth every 6 (six) hours as needed for muscle spasms. 07/01/19  Yes Ngetich, Dinah C, NP   valsartan (DIOVAN) 80 MG tablet TAKE 1 TABLET EVERY MORNING Patient taking differently: Take 80 mg by mouth daily. 10/16/20  Yes Lauree Chandler, NP  vitamin B-12 (CYANOCOBALAMIN) 1000 MCG tablet Take 1,000 mcg by mouth daily.   Yes [provider]    Allergies    Donepezil, Duloxetine hcl, Meloxicam, Memantine, Oxycodone, and Vicodin [hydrocodone-acetaminophen]  Review of Systems   Review of Systems  All other systems reviewed and are negative.  Physical Exam Updated Vital Signs BP 112/60   Pulse 94   Resp 17   Ht 5\' 1"  (1.549 m)   Wt 83.9 kg   SpO2 94%   BMI 34.96 kg/m   Physical Exam Vitals and nursing note reviewed.  Constitutional:      Appearance: She is well-developed.  HENT:     Head: Normocephalic and atraumatic.  Cardiovascular:     Rate and Rhythm: Regular rhythm. Tachycardia present.     Heart sounds: No murmur heard. Pulmonary:     Effort: Pulmonary effort is normal. No respiratory distress.     Comments: Crackles in the bases bilaterally Abdominal:     Palpations: Abdomen is soft.     Tenderness: There is no abdominal tenderness. There is no guarding or rebound.  Musculoskeletal:        General: No tenderness.     Comments: Nonpitting edema to BLE  Skin:    General: Skin is warm and dry.  Neurological:     Mental Status: She is alert and oriented to person, place, and time.  Psychiatric:  Behavior: Behavior normal.    ED Results / Procedures / Treatments   Labs (all labs ordered are listed, but only abnormal results are displayed) Labs Reviewed  COMPREHENSIVE METABOLIC PANEL - Abnormal; Notable for the following components:      Result Value   Potassium 3.4 (*)    Glucose, Bld 170 (*)    Creatinine, Ser 2.20 (*)    Total Protein 6.2 (*)    GFR, Estimated 22 (*)    All other components within normal limits  BRAIN NATRIURETIC PEPTIDE - Abnormal; Notable for the following components:   B Natriuretic Peptide 611.0 (*)    All  other components within normal limits  D-DIMER, QUANTITATIVE - Abnormal; Notable for the following components:   D-Dimer, Quant 1.66 (*)    All other components within normal limits  TROPONIN I (HIGH SENSITIVITY) - Abnormal; Notable for the following components:   Troponin I (High Sensitivity) 45 (*)    All other components within normal limits  RESP PANEL BY RT-PCR (FLU A&B, COVID) ARPGX2  CBC WITH DIFFERENTIAL/PLATELET  TROPONIN I (HIGH SENSITIVITY)    EKG EKG Interpretation  Date/Time:  Sunday May 09 2021 00:37:11 EDT Ventricular Rate:  121 PR Interval:  169 QRS Duration: 92 QT Interval:  296 QTC Calculation: 420 R Axis:   53 Text Interpretation: Sinus tachycardia Repol abnrm suggests ischemia, lateral leads Confirmed by Quintella Reichert [phone]) on 05/09/2021 1:02:33 AM  Radiology DG Chest Port 1 View  Result Date: 05/09/2021 CLINICAL DATA:  Shortness of breath EXAM: PORTABLE CHEST 1 VIEW COMPARISON:  03/25/2020 FINDINGS: Cardiac shadow is within normal limits. Postsurgical changes are noted. Lungs are well aerated bilaterally. Previously seen airspace opacities have resolved in the interval. No new focal abnormality is seen. IMPRESSION: No acute abnormality noted. Electronically Signed   By: Inez Catalina M.D.   On: 05/09/2021 00:58    Procedures Procedures   Medications Ordered in ED Medications  benzonatate (TESSALON) capsule 100 mg (100 mg Oral Given 05/09/21 0340)    ED Course  I have reviewed the triage vital signs and the nursing notes.  Pertinent labs & imaging results that were available during my care of the patient were reviewed by me and considered in my medical decision making (see chart for details).    MDM Rules/Calculators/A&P                          patient with history of coronary artery disease status post cabbage here for evaluation of shortness of breath. She has been recently recovering from an upper respiratory infection. She also feels like  she may have been short of breath due to anxiety. EMS reported wheezes. After duo neb patient with crackles and lung bases. Chest x-ray without acute infiltrate or pulmonary edema. Initial troponin is mildly elevated. On repeat lung assessment patient with clear lungs bilaterally, no respiratory distress. She does have spells of coughing. She does have hypoxia with sitting up in the bed, unable to trial ambulation due to her hypoxia. Oxygen stats do improve after 2 to 3 L nasal cannula oxygen to the mid 90s. Given patient's hypoxia plan to admit for ongoing treatment. Medicine consulted for admission.  Final Clinical Impression(s) / ED Diagnoses Final diagnoses:  Acute respiratory failure with hypoxia [facility])    Rx / DC Orders ED Discharge Orders     None        Quintella Reichert, MD 05/09/21 [phone]

## 2021-05-09 NOTE — Progress Notes (Signed)
ANTICOAGULATION CONSULT NOTE - Initial Consult  Pharmacy Consult for Heparin Indication: chest pain/ACS  Allergies  Allergen Reactions   Donepezil Other (See Comments)    Altered mood, aggression, and caused anger   Duloxetine Hcl     Increased confusion and memory concerns    Meloxicam     Pt suspects that this medicine causes fluid on her lungs.    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   Medical History: Past Medical History:  Diagnosis Date   AMI 11/17/2008   Qualifier: Diagnosis of  By: Marland Mcalpine     Anxiety    Atherosclerotic peripheral vascular disease ([place])    Bacteremia, escherichia coli 04/27/2015   Brachial-basilar insufficiency syndrome    CAD (coronary artery disease)    followed by dr [name].   Celiac artery stenosis (HCC)    CHF (congestive heart failure) ([place]) 11/14/2017   Chronic bilateral low back pain without sciatica    Cognitive impairment    Colon polyps    Community acquired pneumonia    Depression    Diverticulosis    Dysuria    Encephalomalacia    GAD (generalized anxiety disorder)    GERD (gastroesophageal reflux disease)    Glaucoma    Hearing loss    Hemorrhoids    Hypertension    Hypothyroidism    Incontinence    Mixed hyperlipidemia    Myocardial infarction (HCC)     OSA on CPAP    Peripheral arterial disease ([place])    Pneumonitis 04/26/2015   Pyelonephritis    Pyelonephritis due to Escherichia coli 04/28/2015   Sepsis ([place])    Sinus drainage    took z-pack   finished yesterday   Spondylosis of lumbar region without myelopathy or radiculopathy    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 F presents with SOB. To begin heparin for ACS. Trop up to 137. No AC PTA. CBC ok on admission.  Goal of Therapy:  Heparin level 0.3-0.7 units/ml Monitor platelets by anticoagulation protocol: Yes   Plan:  Heparin IV bolus 4000 units Heparin gtt at 950 units/hr Will f/u heparin level in 8 hours Daily heparin level and CBC  Sherlon Handing, PharmD, BCPS Please see amion for complete clinical pharmacist phone list 05/09/2021,6:43 AM

## 2021-05-09 NOTE — Progress Notes (Signed)
Signed out to on call fellow to be aware of troponin pending.

## 2021-05-09 NOTE — ED Notes (Signed)
[name] daughter [phone] requesting that the patient gives her a call

## 2021-05-09 NOTE — Consult Note (Addendum)
Cardiology Consultation:   Patient ID: VICY MEDICO MRN: [id number]; DOB: [birth year]  Admit date: 05/09/2021 Date of Consult: 05/09/2021  PCP:  Lauree Chandler, NP   [facility] Providers Cardiologist:  Quay Burow, MD        Patient Profile:   AHRIA SLAPPEY is a 80 y.o. female with a hx of CAD (MI with PTCA to [place], PTCA to [place], eventual CABG 1999 with grown graft closure 2/4), CKD IV, paroxysmal atrial fibrillation, PAD with L-R femfem crossover, also L subclavian artery stenosis with L common carotid to subclavian artery bypass, celiac artery stenosis, cognitive impairment, TBI, depression, anxiety, hearing loss, GERD, HTN, hypothyroidism, HLD, OSA (not using CPAP), incontinence, prior sepsis who is being seen 05/09/2021 for the evaluation of elevated troponin at the request of Dr. [name].  History of Present Illness:   Ms. Rabago has complex PMH as above with prior PCIs and CABG. Her last cardiac admission was in 03/2020 with suspected late-presenting MI with chest pain starting the day prior. She underwent cath with complex intervention with successful atherectomy and stenting of the proximal LAD; unable to restore flow through the mid LAD. Admission also notable for acute hypoxic respiratory failure with acute combined CHF (EF 25-35% by cath, 35-40% by echo), transient atrial fibrillation, and abdominal pain. EGD showed erythematous, eroded, and nodular mucosa in the antrum and prepyloric region of the stomach. She was not anticoagulated for afib given brief duration. She required SNF at discharge but returned home to her daughter [name]. Last OV 07/2020.  She presented to the hospital with worsening respiratory distress and hypoxia requiring supplemental O2.  About 2-3 weeks ago she had a severe cold and sinus infection. She did a home test for Covid and was negative. She had been managing conservatively. She has had a productive cough since then. Yesterday she was in  bed at rest when she developed tightness in her chest and back associated with worsening SOB so EMS was called. She was found to be hypoxic in the 80s with coughing spells and wheezing. She required steroids, nebs, and supplemental nasal cannula O2. Denies any missed dosing of medicines. Labs show Cr 2.2->2.09 (previously 1.96), hsTroponin 45->137 (no further trends), d-dimer 1.66, flu/Covid panel negative, mild hypokalemia 3.4. CXR NAD. Received 40mg  IV x  and was started on heparin. Echo was repeated today showing improved EF 50-55%, elevated LVEDP, + WMA as described below, mild MR, normal RV. She is now feeling significantly better without acute complaints. No edema or orthopnea lying back in bed.   Past Medical History:  Diagnosis Date   Anxiety    Bacteremia, escherichia coli 04/27/2015   Brachial-basilar insufficiency syndrome    CAD (coronary artery disease)    Celiac artery stenosis (HCC)    Chronic bilateral low back pain without sciatica    Chronic combined systolic (congestive) and diastolic (congestive) heart failure ([place]) 11/14/2017   CKD (chronic kidney disease), stage IV (HCC)    Cognitive impairment    Colon polyps    Community acquired pneumonia    Depression    Diverticulosis    Dysuria    Encephalomalacia    GAD (generalized anxiety disorder)    GERD (gastroesophageal reflux disease)    Glaucoma    Hearing loss    Hemorrhoids    Hypertension    Hypothyroidism    Incontinence    Mixed hyperlipidemia    Myocardial infarction (HCC)    OSA on CPAP    PAF (  paroxysmal atrial fibrillation) (HCC)    Peripheral arterial disease (HCC)    Pneumonitis 04/26/2015   Pyelonephritis due to Escherichia coli 04/28/2015   Sepsis ([place])    Spondylosis of lumbar region without myelopathy or radiculopathy    TBI (traumatic brain injury)    Urticaria    Vertigo, benign positional     Past Surgical History:  Procedure Laterality Date   ABDOMINAL AORTOGRAM W/LOWER EXTREMITY N/A  08/22/2017   Procedure: ABDOMINAL AORTOGRAM W/LOWER EXTREMITY;  Surgeon: Serafina Mitchell, MD;  Location: [place] [facility];  Service: Cardiovascular;  Laterality: N/A;   BILATERAL UPPER EXTREMITY ANGIOGRAM N/A 09/11/2012   Procedure: BILATERAL UPPER EXTREMITY ANGIOGRAM;  Surgeon: Serafina Mitchell, MD;  Location: [facility] CATH LAB;  Service: Cardiovascular;  Laterality: N/A;   BIOPSY  03/25/2020   Procedure: BIOPSY;  Surgeon: Irving Copas., MD;  Location: [place];  Service: Gastroenterology;;   carotid duplex doppler Bilateral 09/03/2012, 11/03/2011   Evidence of 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 patent left common carotid- subclavian artery bypass with turbulent flow noted at the anastomosis with velocities of 295 cm/s   CAROTID-SUBCLAVIAN BYPASS GRAFT  12/15/2011   Procedure: BYPASS GRAFT CAROTID-SUBCLAVIAN;  Surgeon: Serafina Mitchell, MD;  Location: [facility] OR;  Service: Vascular;  Laterality: Left;  Left Carotid subclavian bypass   CORONARY ANGIOPLASTY     CORONARY ARTERY BYPASS GRAFT     CORONARY ATHERECTOMY N/A 03/19/2020   Procedure: CORONARY ATHERECTOMY;  Surgeon: Jettie Booze, MD;  Location: [place] [facility];  Service: Cardiovascular;  Laterality: N/A;   CORONARY STENT INTERVENTION N/A 03/19/2020   Procedure: CORONARY STENT INTERVENTION;  Surgeon: Jettie Booze, MD;  Location: [place] [facility];  Service: Cardiovascular;  Laterality: N/A;   DOPPLER ECHOCARDIOGRAPHY  05/27/2010, 09/17/2008   Mild Proximal septal thickening is noted. Left ventricular systolic functions is normal ejection fraction =>55%. the aortic valve appears to be mildly sclerotic    ESOPHAGOGASTRODUODENOSCOPY (EGD) WITH PROPOFOL N/A 03/25/2020   Procedure: ESOPHAGOGASTRODUODENOSCOPY (EGD) WITH PROPOFOL;  Surgeon: Rush Landmark Telford Nab., MD;  Location: [place];  Service: Gastroenterology;  Laterality: N/A;   fem-fem bypass graft  1999   holter monitor  01/21/2008   The predominant rhythm was normal sinus rhythm. Minimum heartrate of 63 bpm at +01:00, maximum heartrate of 105 bpm at + 10:35; and the average heartrate of 75 bpm. Ventricular ectopic activity totaled 1270: Multifocal; 866-PVC's and 404-VEs              JOINT REPLACEMENT     Left knee   LEFT HEART CATH AND CORS/GRAFTS ANGIOGRAPHY N/A 03/19/2020   Procedure: LEFT HEART CATH AND CORS/GRAFTS ANGIOGRAPHY;  Surgeon: Jettie Booze, MD;  Location: [place] [facility];  Service: Cardiovascular;  Laterality: N/A;   LEFT HEART CATHETERIZATION WITH CORONARY/GRAFT ANGIOGRAM N/A 12/21/2011   Procedure: LEFT HEART CATHETERIZATION WITH Beatrix Fetters;  Surgeon: Leonie Man, MD;  Location: [facility] CATH LAB;  Service: Cardiovascular;  Laterality: N/A;   NM MYOCAR PERF EJECTION FRACTION  09/22/2009, 07/03/2007   the post stress myocardial perfusion images show a normal pattern of perfusion is all regions. The post-stress ejection fraction is 68 %. no significant wall motion abnormalities noted. This is a low risk scan.   PERIPHERAL VASCULAR INTERVENTION  08/22/2017   Procedure: PERIPHERAL VASCULAR INTERVENTION;  Surgeon: Serafina Mitchell, MD;  Location: [place] [facility];  Service: Cardiovascular;;  Fem/Fem Graft   REPLACEMENT TOTAL KNEE  05-2011   UNILATERAL UPPER EXTREMEITY ANGIOGRAM Left 11/15/2011   Procedure: UNILATERAL UPPER Anselmo Rod;  Surgeon: Lorretta Harp, MD;  Location: [facility] CATH LAB;  Service: Cardiovascular;  Laterality: Left;     Home Medications:  Prior to Admission medications   Medication Sig Start Date End Date Taking? Authorizing Provider  acetaminophen (TYLENOL) 325 MG tablet Take 325 mg by mouth in the morning and at bedtime. 1 by mouth in the morning and 1 by mouth in the pm.   Yes [provider]  ALPRAZolam (XANAX) 0.5 MG tablet Take 1 tablet  (0.5 mg total) by mouth daily as needed for anxiety. 11/25/20  Yes Thayer Headings, PMHNP  aspirin EC 81 MG tablet Take 81 mg by mouth at bedtime.    Yes [provider]  cholecalciferol (VITAMIN D) 400 units TABS tablet Take 400 Units by mouth daily.   Yes [provider]  clopidogrel (PLAVIX) 75 MG tablet TAKE 1 TABLET(75 MG) BY MOUTH DAILY Patient taking differently: Take 75 mg by mouth daily. 02/23/21  Yes Lauree Chandler, NP  divalproex (DEPAKOTE ER) 250 MG 24 hr tablet Take 1 tablet (250 mg total) by mouth at bedtime. 11/25/20  Yes Thayer Headings, PMHNP  dorzolamide (TRUSOPT) 2 % ophthalmic solution Place 1 drop into both eyes 2 (two) times daily.   Yes [provider]  escitalopram (LEXAPRO) 20 MG tablet Take 1 tablet (20 mg total) by mouth daily. 11/25/20  Yes Thayer Headings, PMHNP  FARXIGA 10 MG TABS tablet TAKE 1 TABLET(10 MG) BY MOUTH DAILY Patient taking differently: Take 10 mg by mouth daily. 03/22/21  Yes Lauree Chandler, NP  furosemide (LASIX) 40 MG tablet TAKE 1 TABLET(40 MG) BY MOUTH DAILY Patient taking differently: Take 40 mg by mouth daily. 01/25/21  Yes Lauree Chandler, NP  gabapentin (NEURONTIN) 100 MG capsule TAKE 2 CAPSULES(200 MG) BY MOUTH DAILY Patient taking differently: Take 100 mg by mouth 2 (two) times daily. 01/05/21  Yes Lauree Chandler, NP  latanoprost (XALATAN) 0.005 % ophthalmic solution Place 1 drop into both eyes at bedtime.   Yes [provider]  levothyroxine (SYNTHROID) 175 MCG tablet Take 1 tablet (175 mcg total) by mouth daily. Take at the same time daily separate from other medications 04/13/21  Yes Lauree Chandler, NP  meclizine (ANTIVERT) 25 MG tablet Take 1 tablet (25 mg total) by mouth as needed for dizziness. 11/30/18  Yes Lauree Chandler, NP  metoprolol succinate (TOPROL-XL) 25 MG 24 hr tablet Take 1 tablet (25 mg total) by mouth daily. Patient taking differently: Take 12.5 mg by mouth in the morning and  at bedtime. 04/01/20  Yes Furth, Cadence H, PA-C  nitroGLYCERIN (NITROSTAT) 0.4 MG SL tablet Place 1 tablet (0.4 mg total) under the tongue every 5 (five) minutes x 3 doses as needed for chest pain. 03/31/20  Yes Katsadouros, Vasilios, MD  pantoprazole (PROTONIX) 40 MG tablet TAKE 1 TABLET EVERY DAY Patient taking differently: Take 40 mg by mouth daily. 02/11/21  Yes Lauree Chandler, NP  rosuvastatin (CRESTOR) 40 MG tablet TAKE 1 TABLET(40 MG) BY MOUTH DAILY Patient taking differently: Take 40 mg by mouth at bedtime. 10/01/20  Yes Lauree Chandler, NP  tiZANidine (ZANAFLEX) 2 MG tablet Take 1 tablet (2 mg total) by mouth every 6 (six) hours as needed for muscle spasms. 07/01/19  Yes Ngetich, Dinah C, NP  valsartan (DIOVAN) 80 MG  tablet TAKE 1 TABLET EVERY MORNING Patient taking differently: Take 80 mg by mouth daily. 10/16/20  Yes Lauree Chandler, NP  vitamin B-12 (CYANOCOBALAMIN) 1000 MCG tablet Take 1,000 mcg by mouth daily.   Yes [provider]    Inpatient Medications: Scheduled Meds:  aspirin EC  81 mg Oral QHS   clopidogrel  75 mg Oral Daily   divalproex  250 mg Oral QHS   dorzolamide  1 drop Both Eyes BID   escitalopram  20 mg Oral Daily   gabapentin  100 mg Oral BID   latanoprost  1 drop Both Eyes QHS   levothyroxine  175 mcg Oral Daily   metoprolol tartrate  12.5 mg Oral BID   pantoprazole  40 mg Oral Daily   rosuvastatin  40 mg Oral QHS   Continuous Infusions:  heparin 950 Units/hr (05/09/21 0733)   PRN Meds: albuterol, ALPRAZolam, guaiFENesin-dextromethorphan  Allergies:    Allergies  Allergen Reactions   Donepezil Other (See Comments)    Altered mood, aggression, and caused anger   Duloxetine Hcl     Increased confusion and memory concerns    Meloxicam     Pt suspects that this medicine causes fluid on her lungs.    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    Vicodin [Hydrocodone-Acetaminophen] Other (See Comments)    Delirium, confusion, and toxic dementia    Social History:   Social History   Socioeconomic History   Marital status: Divorced    Spouse name: Not on file   Number of children: Not on file   Years of education: Not on file   Highest education level: Not on file  Occupational History   Not on file  Tobacco Use   Smoking status: Former    Years: 3.00    Types: Cigarettes    Quit date: 11/27/1981    Years since quitting: 39.4   Smokeless tobacco: Never  Vaping Use   Vaping Use: Never used  Substance and Sexual Activity   Alcohol use: Not Currently    Alcohol/week: 0.0 standard drinks   Drug use: No   Sexual activity: Not Currently    Comment: 1st intercourse 49 yo-1 partner  Other Topics Concern   Not on file  Social History Narrative   Social History      Diet? Healthy but too many sweets      Do you drink/eat things with caffeine? Yes occasionally      Marital status?                    D                What year were you married?      Do you live in a house, apartment, assisted living, condo, trailer, etc.? condo      Is it one or more stories? 1      How many persons live in your home? 2      Do you have any pets in your home? (please list) 1 cat      Highest level of education completed? 1 year college      Current or past profession: housewife      Do you exercise?           no  Type & how often?      Advanced Directives      Do you have a living will? no      Do you have a DNR form?             no                     If not, do you want to discuss one? yes      Do you have signed POA/HPOA for forms? no      Functional Status      Do you have difficulty bathing or dressing yourself? No- gets tired      Do you have difficulty preparing food or eating? No- eats frozen entrees or poor nutrition meals      Do you have difficulty managing your  medications? yes      Do you have difficulty managing your finances? yes      Do you have difficulty affording your medications? No- funds running low   Social Determinants of Health   Financial Resource Strain: Not on file  Food Insecurity: Not on file  Transportation Needs: Not on file  Physical Activity: Not on file  Stress: Not on file  Social Connections: Not on file  Intimate Partner Violence: Not on file    Family History:    Family History  Problem Relation Age of Onset   Heart attack Mother    Heart disease Mother        before age 72   Diabetes Father    Heart disease Father    Hypertension Father    Hyperlipidemia Father    Heart attack Father 35   Heart attack Brother 33   Cerebral palsy Sister 33   Congestive Heart Failure Sister 15   Heart attack Sister 42   Hypertension Sister 32   Dementia Sister    Anxiety disorder Sister    Anxiety disorder Sister 71   Heart Problems Sister    Stroke Sister    Heart Problems Sister 75   Heart attack Sister 30   Colon cancer Brother 71   Prostate cancer Brother 42   Hypertension Daughter    Irritable bowel syndrome Daughter    Depression Daughter    Anxiety disorder Daughter      ROS:  Please see the history of present illness.  All other ROS reviewed and negative.     Physical Exam/Data:   Vitals:   05/09/21 1320 05/09/21 1400 05/09/21 1445 05/09/21 1530  BP: (!) 141/57 (!) 141/61 (!) 143/63 137/60  Pulse: 71 75 73 71  Resp: (!) 21 15 18 16   Temp:    98.1 F (36.7 C)  TempSrc:    Oral  SpO2: 98% 98% 98% 96%  Weight:      Height:        Intake/Output Summary (Last 24 hours) at 05/09/2021 1603 Last data filed at 05/09/2021 1032 Gross per 24 hour  Intake --  Output 2050 ml  Net -2050 ml   Last 3 Weights 05/09/2021 04/07/2021 12/07/2020  Weight (lbs) 185 lb 194 lb 194 lb  Weight (kg) 83.915 kg 87.998 kg 87.998 kg  Some encounter information is confidential and restricted. Go to Review Flowsheets  activity to see all data.     Body mass index is 34.96 kg/m.  Examination per MD   EKG:  The EKG was personally reviewed and demonstrates:  sinus tach 102, ST depression I, II, TWI avL,  V5-V6 increased from 04/2020 tracing (similar territory of EKG changes 03/2020)  Telemetry:  Telemetry was personally reviewed and demonstrates:  sinus tach then NSR  Relevant CV Studies: Cardiac Cath 03/2020 Ost LAD to Prox LAD lesion is 99% stenosed. LIMA to LAD known to be atretic. After orbital atherectomy, A drug-eluting stent was successfully placed using a STENT RESOLUTE ONYX 3.5X15. Post intervention, there is a 0% residual stenosis. Mid LAD lesion is 100% stenosed. Despite atherectomy and angioplasty, flow could not be restored. Mid Cx lesion is 100% stenosed. SVG to OM patent. Prox RCA lesion is 100% stenosed. SVG to PDA occluded. There is moderate left ventricular systolic dysfunction. LV end diastolic pressure is moderately elevated. The left ventricular ejection fraction is 25-35% by visual estimate. There is no aortic valve stenosis. Balloon angioplasty was performed using a BALLOON SAPPHIRE 2.0X12.   Complex intervention due to severe three-vessel coronary artery disease with prior bypass surgery.  Successful atherectomy and stenting of the proximal LAD.  Unable to restore flow through the mid LAD.  I suspect, she is a somewhat late presenting infarct.  She already has evidence of heart failure with elevated LVEDP.  Will need ongoing medical therapy to help her LV dysfunction.  2D echo 03/2020  1. Left ventricular ejection fraction, by estimation, is 35 to 40%. The  left ventricle has moderately decreased function. The left ventricle  demonstrates regional wall motion abnormalities (see scoring  diagram/findings for description). Left ventricular   diastolic parameters are consistent with Grade II diastolic dysfunction  (pseudonormalization). There is severe akinesis of the left ventricular,   mid-apical anteroseptal wall.   2. Right ventricular systolic function is mildly reduced. The right  ventricular size is normal.   3. The mitral valve is grossly normal. Mild mitral valve regurgitation.   4. The aortic valve is normal in structure. Aortic valve regurgitation is  not visualized. No aortic stenosis is present.  2D Echo today    1. Left ventricular ejection fraction, by estimation, is 50 to 55%. The  left ventricle has low normal function. The left ventricle demonstrates  regional wall motion abnormalities (see scoring diagram/findings for  description). There is mild concentric  left ventricular hypertrophy. Left ventricular diastolic parameters are  indeterminate. Elevated left ventricular end-diastolic pressure. There is  akinesis of the left ventricular, apical septal wall, inferior wall and  inferolateral wall. There is  akinesis of the left ventricular, entire apical segment. There is  hypokinesis of the left ventricular, mid-apical anteroseptal wall.   2. Right ventricular systolic function is normal. The right ventricular  size is normal.   3. The mitral valve is normal in structure. Mild mitral valve  regurgitation. No evidence of mitral stenosis. Moderate mitral annular  calcification.   4. The aortic valve is tricuspid. Aortic valve regurgitation is not  visualized. Mild aortic valve sclerosis is present, with no evidence of  aortic valve stenosis.   5. The inferior vena cava is normal in size with greater than 50%  respiratory variability, suggesting right atrial pressure of 3 mmHg.   Laboratory Data:  High Sensitivity Troponin:   Recent Labs  Lab 05/09/21 0042 05/09/21 0435  TROPONINIHS 45* 137*     Chemistry Recent Labs  Lab 05/09/21 0042 05/09/21 0759  NA 138 139  K 3.4* 4.3  CL 105 104  CO2 22 21*  GLUCOSE 170* 215*  BUN 20 21  CREATININE 2.20* 2.09*  CALCIUM 8.9 9.2  MG  --  1.8  GFRNONAA 22*  24*  ANIONGAP 11 14    Recent Labs   Lab 05/09/21 0042  PROT 6.2*  ALBUMIN 3.5  AST 16  ALT 11  ALKPHOS 66  BILITOT 1.1   Lipids No results for input(s): CHOL, TRIG, HDL, LABVLDL, LDLCALC, CHOLHDL in the last 168 hours.  Hematology Recent Labs  Lab 05/09/21 0042  WBC 8.9  RBC 4.47  HGB 12.3  HCT 39.6  MCV 88.6  MCH 27.5  MCHC 31.1  RDW 13.9  PLT 151   Thyroid No results for input(s): TSH, FREET4 in the last 168 hours.  BNP Recent Labs  Lab 05/09/21 0042  BNP 611.0*    DDimer  Recent Labs  Lab 05/09/21 0500  DDIMER 1.66*     Radiology/Studies:  DG Chest Port 1 View  Result Date: 05/09/2021 CLINICAL DATA:  Shortness of breath EXAM: PORTABLE CHEST 1 VIEW COMPARISON:  03/25/2020 FINDINGS: Cardiac shadow is within normal limits. Postsurgical changes are noted. Lungs are well aerated bilaterally. Previously seen airspace opacities have resolved in the interval. No new focal abnormality is seen. IMPRESSION: No acute abnormality noted. Electronically Signed   By: Inez Catalina M.D.   On: 05/09/2021 00:58   ECHOCARDIOGRAM COMPLETE  Result Date: 05/09/2021    ECHOCARDIOGRAM REPORT   Patient Name:   NEVAEHA FINERTY Date of Exam: 05/09/2021 Medical Rec #:  [id number]       Height:       61.0 in Accession #:    [id number]      Weight:       185.0 lb Date of Birth:  1941/08/06       BSA:          1.827 m Patient Age:    50 years        BP:           118/60 mmHg Patient Gender: F               HR:           82 bpm. Exam Location:  Inpatient Procedure: 2D Echo, Cardiac Doppler, Color Doppler and Intracardiac            Opacification Agent Indications:    NSTEMI I21.4                 CHF-Acute Systolic K93.81  History:        Patient has prior history of Echocardiogram examinations, most                 recent 03/19/2020. CHF, CAD and Previous Myocardial Infarction,                 PAD; Risk Factors:Sleep Apnea. Thyroid disease, GERD.  Sonographer:    Darlina Sicilian RDCS Referring Phys: [id number] [place]   1. Left ventricular ejection fraction, by estimation, is 50 to 55%. The left ventricle has low normal function. The left ventricle demonstrates regional wall motion abnormalities (see scoring diagram/findings for description). There is mild concentric left ventricular hypertrophy. Left ventricular diastolic parameters are indeterminate. Elevated left ventricular end-diastolic pressure. There is akinesis of the left ventricular, apical septal wall, inferior wall and inferolateral wall. There is akinesis of the left ventricular, entire apical segment. There is hypokinesis of the left ventricular, mid-apical anteroseptal wall.  2. Right ventricular systolic function is normal. The right ventricular size is normal.  3. The mitral valve is normal in structure. Mild mitral valve regurgitation. No evidence of mitral stenosis. Moderate mitral annular  calcification.  4. The aortic valve is tricuspid. Aortic valve regurgitation is not visualized. Mild aortic valve sclerosis is present, with no evidence of aortic valve stenosis.  5. The inferior vena cava is normal in size with greater than 50% respiratory variability, suggesting right atrial pressure of 3 mmHg. FINDINGS  Left Ventricle: Left ventricular ejection fraction, by estimation, is 50 to 55%. The left ventricle has low normal function. The left ventricle demonstrates regional wall motion abnormalities. The left ventricular internal cavity size was normal in size. There is mild concentric left ventricular hypertrophy. Left ventricular diastolic parameters are indeterminate. Elevated left ventricular end-diastolic pressure. Right Ventricle: The right ventricular size is normal. No increase in right ventricular wall thickness. Right ventricular systolic function is normal. Left Atrium: Left atrial size was normal in size. Right Atrium: Right atrial size was normal in size. Pericardium: There is no evidence of pericardial effusion. Mitral Valve: The mitral valve is normal  in structure. Moderate mitral annular calcification. Mild mitral valve regurgitation. No evidence of mitral valve stenosis. Tricuspid Valve: The tricuspid valve is normal in structure. Tricuspid valve regurgitation is mild . No evidence of tricuspid stenosis. Aortic Valve: The aortic valve is tricuspid. Aortic valve regurgitation is not visualized. Mild aortic valve sclerosis is present, with no evidence of aortic valve stenosis. Pulmonic Valve: The pulmonic valve was normal in structure. Pulmonic valve regurgitation is trivial. No evidence of pulmonic stenosis. Aorta: The aortic root is normal in size and structure. Venous: The inferior vena cava is normal in size with greater than 50% respiratory variability, suggesting right atrial pressure of 3 mmHg. IAS/Shunts: No atrial level shunt detected by color flow Doppler.  LEFT VENTRICLE PLAX 2D LVIDd:         4.30 cm     Diastology LVIDs:         2.60 cm     LV e' medial:    6.15 cm/s LV PW:         1.10 cm     LV E/e' medial:  18.7 LV IVS:        1.20 cm     LV e' lateral:   8.03 cm/s LVOT diam:     1.70 cm     LV E/e' lateral: 14.3 LV SV:         40 LV SV Index:   22 LVOT Area:     2.27 cm  LV Volumes (MOD) LV vol d, MOD A2C: 71.7 ml LV vol d, MOD A4C: 80.0 ml LV vol s, MOD A2C: 39.1 ml LV vol s, MOD A4C: 35.6 ml LV SV MOD A2C:     32.6 ml LV SV MOD A4C:     80.0 ml LV SV MOD BP:      38.5 ml RIGHT VENTRICLE RV S prime:     8.55 cm/s TAPSE (M-mode): 1.3 cm LEFT ATRIUM             Index       RIGHT ATRIUM           Index LA diam:        4.10 cm 2.24 cm/m  RA Area:     10.70 cm LA Vol (A2C):   19.9 ml 10.89 ml/m RA Volume:   18.10 ml  9.91 ml/m LA Vol (A4C):   35.7 ml 19.54 ml/m LA Biplane Vol: 27.5 ml 15.05 ml/m  AORTIC VALVE LVOT Vmax:   65.50 cm/s LVOT Vmean:  47.400 cm/s LVOT VTI:  0.176 m  AORTA Ao Root diam: 2.70 cm MITRAL VALVE MV Area (PHT): 4.36 cm     SHUNTS MV Decel Time: 174 msec     Systemic VTI:  0.18 m MV E velocity: 115.00 cm/s  Systemic  Diam: 1.70 cm MV A velocity: 121.00 cm/s MV E/A ratio:  0.95 Fransico Him MD Electronically signed by Fransico Him MD Signature Date/Time: 05/09/2021/1:08:10 PM    Final      Assessment and Plan:   1. Acute hypoxic respiratory failure with suspected acute bronchitis - occurred as a sequelae of 2 weeks of productive cough and recent URI - port CXR without acute disease - improved with supportive care with steroids, nebs, and one dose of IV Lasix - will defer to primary team whether plan to pursue PE w/u given elevated d-dimer, also see plan for procalcitonin but not ordered at this time, query utility of RVP  2. CAD s/p prior MIs, PCIs, CABG, with elevated troponin - low level elevation from 45->137, not trended further - will repeat value now to trend - pending discussion with Dr. Domenic Polite - patient currently on ASA, Plavix, metoprolol, statin and IV heparin - 2D echo shows improved LVEF from prior, + WMA present  3. Chronic combined CHF - BNP, exam, CXR somewhat out of proportion for degree of hypoxia - now much improved with supportive care, no edema on exam, no recent missed meds per patient - got IV Lasix this AM, can consider resuming oral in AM - see MD note below   4. CKD stage IV - Cr 1.96 in 03/2021 which was higher than before, admitted at 2.2->2.09 - continue to trend - Farxiga, ARB on hold by primary team  5. Paroxysmal atrial fibrillation in 2021 - no evidence of recurrence - not on anticoag as above - follow on tele   Risk Assessment/Risk Scores:     HEAR Score (for undifferentiated chest pain):  HEAR Score: 6  New York Heart Association (NYHA) Functional Class NYHA Class IV on arrival now back to II-III   CHA2DS2-VASc Score = 6   This indicates a 9.7% annual risk of stroke. The patient's score is based upon: CHF History: 1 HTN History: 1 Diabetes History: 0 Stroke History: 0 Vascular Disease History: 1 Age Score: 2 Gender Score: 1     For questions or  updates, please contact [place] Please consult [email or web address] for contact info under    Signed, Charlie Pitter, PA-C  05/09/2021 4:03 PM    Attending note:  Patient seen and examined.  I reviewed her records and discussed the case with Ms. Purcell Mouton, I agree with her above findings.  She presents to the ER complaining of shortness of breath, chest tightness and productive cough.  States that she had cold-like symptoms and sinus infection about 2 weeks ago and was COVID-19 negative at that time.  She was found to have acute hypoxic respiratory failure at presentation, has been treated with supplemental oxygen, steroids, and nebulizer treatments.  She lives at home with her daughter, gets assistance with her medications, states that she has not missed anything recently.  Chest x-ray shows no infiltrates and she is COVID-19 negative and influenza negative at this time.  D-dimer elevated at 1.66.  On examination she is in no distress, states that she feels better.  She is afebrile, heart rate is in the 70s in sinus rhythm by telemetry which I personally reviewed.  Blood pressure ranging 115-140.  Lungs exhibit no wheezing  or crackles, cardiac exam with RRR and 1/6 systolic murmur.  Pertinent lab work includes potassium 4.3, BUN 21, creatinine 2.09, high-sensitivity troponin I 45 up to 137, BNP 611, WBC 8.9, hemoglobin 12.3, platelets 151, SARS coronavirus 2 test negative, D-dimer 1.66.  Chest x-ray reports no acute process.  I personally reviewed her ECG which shows sinus tachycardia with nonspecific ST-T changes.  Minor elevation in high-sensitivity troponin I most likely reflective of demand ischemia rather than frank ACS.  She has been placed on heparin by primary team, would continue to cycle cardiac enzymes for now.  She did have a follow-up echocardiogram done yesterday which actually shows improved LVEF compared to prior baseline, now 50 to 55% with wall motion abnormalities consistent with  ischemic heart disease.  No obvious breakthrough atrial fibrillation.  At this point do not anticipate further ischemic work-up, continue supportive measures.  Could consider checking procalcitonin and whether or not VQ scan necessary given elevated D-dimer per primary team.  Agree with holding Farxiga and ARB for the time being with presenting creatinine 2.2 and recent diuresis.  Satira Sark, M.D., F.A.C.C.

## 2021-05-09 NOTE — ED Triage Notes (Signed)
BIB [facility] from for resp distress that started at 2230. Hx of heart transplant, CHF, HTN, GERD. PIV 20ga rt hand. Atrovent 0.5mg , Albuterol 10mg , Solu Medrol 125mg . Wheezing expiratory.

## 2021-05-09 NOTE — Plan of Care (Signed)
°  Problem: Coping: °Goal: Level of anxiety will decrease °Outcome: Progressing °  °

## 2021-05-09 NOTE — Progress Notes (Signed)
ANTICOAGULATION CONSULT NOTE - Initial Consult  Pharmacy Consult for Heparin Indication: chest pain/ACS  Allergies  Allergen Reactions   Donepezil Other (See Comments)    Altered mood, aggression, and caused anger   Duloxetine Hcl     Increased confusion and memory concerns    Meloxicam     Pt suspects that this medicine causes fluid on her lungs.    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    Vicodin [Hydrocodone-Acetaminophen] Other (See Comments)    Delirium, confusion, and toxic dementia    Patient Measurements: Height: 5\' 1"  (154.9 cm) Weight: 83.9 kg (185 lb) IBW/kg (Calculated) : 47.8 Heparin Dosing Weight: 67 kg  Vital Signs: Temp: 98 F (36.7 C) (10/02 1625) Temp Source: Oral (10/02 1625) BP: 153/60 (10/02 1615) Pulse Rate: 76 (10/02 1615)  Labs: Recent Labs    05/09/21 0042 05/09/21 0435 05/09/21 0759 05/09/21 1512  HGB 12.3  --   --   --   HCT 39.6  --   --   --   PLT 151  --   --   --   HEPARINUNFRC  --   --   --  0.28*  CREATININE 2.20*  --  2.09*  --   TROPONINIHS 45* 137*  --   --      Estimated Creatinine Clearance: 21.4 mL/min (A) (by C-G formula based on SCr of 2.09 mg/dL (H)).   Medical History: Past Medical History:  Diagnosis Date   Anxiety    Bacteremia, escherichia coli 04/27/2015   Brachial-basilar insufficiency syndrome    CAD (coronary artery disease)    Celiac artery stenosis (HCC)    Chronic bilateral low back pain without sciatica    Chronic combined systolic (congestive) and diastolic (congestive) heart failure ([place]) 11/14/2017   CKD (chronic kidney disease), stage IV (HCC)    Cognitive impairment    Colon polyps    Community acquired pneumonia    Depression    Diverticulosis    Dysuria    Encephalomalacia    GAD (generalized anxiety disorder)    GERD (gastroesophageal reflux disease)    Glaucoma     Hearing loss    Hemorrhoids    Hypertension    Hypothyroidism    Incontinence    Mixed hyperlipidemia    Myocardial infarction (HCC)    OSA on CPAP    PAF (paroxysmal atrial fibrillation) ([place])    Peripheral arterial disease ([place])    Pneumonitis 04/26/2015   Pyelonephritis due to Escherichia coli 04/28/2015   Sepsis (HCC)    Spondylosis of lumbar region without myelopathy or radiculopathy    TBI (traumatic brain injury)    Urticaria    Vertigo, benign positional       Assessment: 80 y.o. F presents with likely demand ischemia rather than frank ACS per cardiology.  No anticoagulation prior to admission. Pharmacy consulted for heparin.    First HL drawn ~8hr after bolus is 0.28 and subtherapeutic.  No issues with infusion or bleeding per RN.   Goal of Therapy:  Heparin level 0.3-0.7 units/ml Monitor platelets by anticoagulation protocol: Yes   Plan:   Increase heparin gtt to 1050 units/hr Monitor daily HL, CBC/plt Monitor for signs/symptoms of bleeding  F/u heparin plan   Benetta Spar, PharmD, BCPS, BCCP Clinical Pharmacist  Please check AMION for all [facility] phone numbers After 10:00 PM, call  [place] [phone]

## 2021-05-10 ENCOUNTER — Inpatient Hospital Stay (HOSPITAL_COMMUNITY): Payer: Medicare HMO

## 2021-05-10 DIAGNOSIS — E7800 Pure hypercholesterolemia, unspecified: Secondary | ICD-10-CM

## 2021-05-10 DIAGNOSIS — I2510 Atherosclerotic heart disease of native coronary artery without angina pectoris: Secondary | ICD-10-CM | POA: Diagnosis not present

## 2021-05-10 DIAGNOSIS — J9601 Acute respiratory failure with hypoxia: Secondary | ICD-10-CM | POA: Diagnosis not present

## 2021-05-10 DIAGNOSIS — N179 Acute kidney failure, unspecified: Secondary | ICD-10-CM

## 2021-05-10 DIAGNOSIS — I214 Non-ST elevation (NSTEMI) myocardial infarction: Secondary | ICD-10-CM

## 2021-05-10 LAB — BASIC METABOLIC PANEL
Anion gap: 10 (ref 5–15)
BUN: 28 mg/dL — ABNORMAL HIGH (ref 8–23)
CO2: 23 mmol/L (ref 22–32)
Calcium: 9 mg/dL (ref 8.9–10.3)
Chloride: 103 mmol/L (ref 98–111)
Creatinine, Ser: 1.65 mg/dL — ABNORMAL HIGH (ref 0.44–1.00)
GFR, Estimated: 31 mL/min — ABNORMAL LOW (ref >60)
Glucose, Bld: 114 mg/dL — ABNORMAL HIGH (ref 70–99)
Potassium: 4.4 mmol/L (ref 3.5–5.1)
Sodium: 136 mmol/L (ref 135–145)

## 2021-05-10 LAB — LIPID PANEL
Cholesterol: 110 mg/dL (ref 0–200)
HDL: 34 mg/dL — ABNORMAL LOW (ref >40)
LDL Cholesterol: 44 mg/dL (ref 0–99)
Total CHOL/HDL Ratio: 3.2 RATIO
Triglycerides: 160 mg/dL — ABNORMAL HIGH (ref <150)
VLDL: 32 mg/dL (ref 0–40)

## 2021-05-10 LAB — CBC
HCT: 36.7 % (ref 36.0–46.0)
Hemoglobin: 11.9 g/dL — ABNORMAL LOW (ref 12.0–15.0)
MCH: 27.8 pg (ref 26.0–34.0)
MCHC: 32.4 g/dL (ref 30.0–36.0)
MCV: 85.7 fL (ref 80.0–100.0)
Platelets: 167 10*3/uL (ref 150–400)
RBC: 4.28 MIL/uL (ref 3.87–5.11)
RDW: 13.8 % (ref 11.5–15.5)
WBC: 9.1 10*3/uL (ref 4.0–10.5)
nRBC: 0 % (ref 0.0–0.2)

## 2021-05-10 LAB — TROPONIN I (HIGH SENSITIVITY): Troponin I (High Sensitivity): 273 ng/L (ref <18)

## 2021-05-10 LAB — PROCALCITONIN: Procalcitonin: 0.52 ng/mL

## 2021-05-10 LAB — HEPARIN LEVEL (UNFRACTIONATED)
Heparin Unfractionated: 0.24 IU/mL — ABNORMAL LOW (ref 0.30–0.70)
Heparin Unfractionated: 0.54 IU/mL (ref 0.30–0.70)

## 2021-05-10 MED ORDER — AMOXICILLIN-POT CLAVULANATE 500-125 MG PO TABS
1.0000 | ORAL_TABLET | Freq: Two times a day (BID) | ORAL | Status: DC
  Administered 2021-05-10 – 2021-05-11 (×3): 500 mg via ORAL
  Filled 2021-05-10 (×4): qty 1

## 2021-05-10 MED ORDER — TECHNETIUM TO 99M ALBUMIN AGGREGATED
3.8000 | Freq: Once | INTRAVENOUS | Status: AC | PRN
  Administered 2021-05-10: 3.8 via INTRAVENOUS

## 2021-05-10 NOTE — Progress Notes (Signed)
[place] for Heparin Indication: chest pain/ACS  Allergies  Allergen Reactions   Donepezil Other (See Comments)    Altered mood, aggression, and caused anger   Duloxetine Hcl     Increased confusion and memory concerns    Meloxicam     Pt suspects that this medicine causes fluid on her lungs.    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    Vicodin [Hydrocodone-Acetaminophen] Other (See Comments)    Delirium, confusion, and toxic dementia    Patient Measurements: Height: 5\' 1"  (154.9 cm) Weight: 83.9 kg (185 lb) IBW/kg (Calculated) : 47.8 Heparin Dosing Weight: 67 kg  Vital Signs: Temp: 98.5 F (36.9 C) (10/03 1136) Temp Source: Oral (10/03 1136) BP: 135/63 (10/03 1136) Pulse Rate: 70 (10/03 1136)  Labs: Recent Labs    05/09/21 0042 05/09/21 0435 05/09/21 0759 05/09/21 1512 05/09/21 1754 05/10/21 0142 05/10/21 0459 05/10/21 1217  HGB 12.3  --   --   --   --  11.9*  --   --   HCT 39.6  --   --   --   --  36.7  --   --   PLT 151  --   --   --   --  167  --   --   HEPARINUNFRC  --   --   --  0.28*  --  0.24*  --  0.54  CREATININE 2.20*  --  2.09*  --   --  1.65*  --   --   TROPONINIHS 45* 137*  --   --  375*  --  273*  --      Estimated Creatinine Clearance: 27.1 mL/min (A) (by C-G formula based on SCr of 1.65 mg/dL (H)).    Assessment: 80 y.o. F presents with likely demand ischemia rather than frank ACS per cardiology.  No anticoagulation prior to admission. Pharmacy consulted for heparin.    Heparin level therapeutic, CBC stable this am. NM perf planned, no further ischemic workup planned by cardiology. If VQ scan neg, could likely stop heparin.  Goal of Therapy:  Heparin level 0.3-0.7 units/ml Monitor platelets by anticoagulation protocol: Yes   Plan:   Heparin 1300 units/h for now Daily heparin level and  CBC  Arrie Senate, PharmD, [place], [facility] Clinical Pharmacist [phone] Please check AMION for all [facility] Pharmacy numbers 05/10/2021

## 2021-05-10 NOTE — Progress Notes (Signed)
[place] for Heparin Indication: chest pain/ACS  Allergies  Allergen Reactions   Donepezil Other (See Comments)    Altered mood, aggression, and caused anger   Duloxetine Hcl     Increased confusion and memory concerns    Meloxicam     Pt suspects that this medicine causes fluid on her lungs.    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    Vicodin [Hydrocodone-Acetaminophen] Other (See Comments)    Delirium, confusion, and toxic dementia    Patient Measurements: Height: 5\' 1"  (154.9 cm) Weight: 83.9 kg (185 lb) IBW/kg (Calculated) : 47.8 Heparin Dosing Weight: 67 kg  Vital Signs: Temp: 98.7 F (37.1 C) (10/02 2333) Temp Source: Oral (10/02 2333) BP: 111/62 (10/02 2333) Pulse Rate: 75 (10/02 2333)  Labs: Recent Labs    05/09/21 0042 05/09/21 0435 05/09/21 0759 05/09/21 1512 05/09/21 1754 05/10/21 0142  HGB 12.3  --   --   --   --  11.9*  HCT 39.6  --   --   --   --  36.7  PLT 151  --   --   --   --  167  HEPARINUNFRC  --   --   --  0.28*  --  0.24*  CREATININE 2.20*  --  2.09*  --   --  1.65*  TROPONINIHS 45* 137*  --   --  375*  --      Estimated Creatinine Clearance: 27.1 mL/min (A) (by C-G formula based on SCr of 1.65 mg/dL (H)).    Assessment: 80 y.o. F presents with likely demand ischemia rather than frank ACS per cardiology.  No anticoagulation prior to admission. Pharmacy consulted for heparin.    Heparin level down to subtherapeutic (0.24) on gtt at 1050 units/hr. No issues with line or bleeding reported per RN.  Goal of Therapy:  Heparin level 0.3-0.7 units/ml Monitor platelets by anticoagulation protocol: Yes   Plan:   Increase heparin gtt to 1300 units/hr F/u 8 hr heparin level  F/u heparin plan   Sherlon Handing, PharmD, BCPS Please see amion for complete clinical pharmacist phone list 05/10/2021  3:44 AM

## 2021-05-10 NOTE — Progress Notes (Signed)
Progress Note  Patient Name: Deanna [name] Date of Encounter: 05/10/2021  [facility] Cardiologist: Quay Burow, MD   Subjective   She states she has no chest pain, dizziness, SOB this morning. She had one episode of chest pain the past week while sleeping she recalls SOB more rather than chest pain. She states she had a cold few weeks back but cough is much better now.   Inpatient Medications    Scheduled Meds:  aspirin EC  81 mg Oral QHS   clopidogrel  75 mg Oral Daily   divalproex  250 mg Oral QHS   dorzolamide  1 drop Both Eyes BID   escitalopram  20 mg Oral Daily   gabapentin  100 mg Oral BID   latanoprost  1 drop Both Eyes QHS   levothyroxine  175 mcg Oral Daily   metoprolol tartrate  12.5 mg Oral BID   pantoprazole  40 mg Oral Daily   rosuvastatin  40 mg Oral QHS   Continuous Infusions:  heparin 1,300 Units/hr (05/10/21 0531)   PRN Meds: albuterol, ALPRAZolam, guaiFENesin-dextromethorphan   Vital Signs    Vitals:   05/10/21 0425 05/10/21 0836 05/10/21 1118 05/10/21 1136  BP: 115/67 (!) 120/58 (!) 137/51 135/63  Pulse: 72 69 75 70  Resp: 14 13 16 15   Temp: 97.7 F (36.5 C) (!) 97.5 F (36.4 C) 98.5 F (36.9 C) 98.5 F (36.9 C)  TempSrc: Oral Oral Oral Oral  SpO2: 96% 97% 98% 97%  Weight:      Height:        Intake/Output Summary (Last 24 hours) at 05/10/2021 1146 Last data filed at 05/09/2021 1835 Gross per 24 hour  Intake 145.56 ml  Output --  Net 145.56 ml   Last 3 Weights 05/09/2021 04/07/2021 12/07/2020  Weight (lbs) 185 lb 194 lb 194 lb  Weight (kg) 83.915 kg 87.998 kg 87.998 kg  Some encounter information is confidential and restricted. Go to Review Flowsheets activity to see all data.      Telemetry    Sinus rhythm 70s  - Personally Reviewed  ECG    N/A this AM  - Personally Reviewed  Physical Exam   GEN: No acute distress.  Neck: No JVD Cardiac: RRR, no murmurs, rubs, or gallops.  Respiratory: Clear to auscultation  bilaterally.on [facility]. Pox 98%. Speaks full sentence  GI: Soft, nontender, non-distended  MS: No BLE edema; No deformity. Neuro:  Mild memory loss, oriented to place/person, follow commands, global weakness   Psych: Normal affect   Labs    High Sensitivity Troponin:   Recent Labs  Lab 05/09/21 0042 05/09/21 0435 05/09/21 1754 05/10/21 0459  TROPONINIHS 45* 137* 375* 273*     Chemistry Recent Labs  Lab 05/09/21 0042 05/09/21 0759 05/10/21 0142  NA 138 139 136  K 3.4* 4.3 4.4  CL 105 104 103  CO2 22 21* 23  GLUCOSE 170* 215* 114*  BUN 20 21 28*  CREATININE 2.20* 2.09* 1.65*  CALCIUM 8.9 9.2 9.0  MG  --  1.8  --   PROT 6.2*  --   --   ALBUMIN 3.5  --   --   AST 16  --   --   ALT 11  --   --   ALKPHOS 66  --   --   BILITOT 1.1  --   --   GFRNONAA 22* 24* 31*  ANIONGAP 11 14 10     Lipids  Recent Labs  Lab  05/10/21 0142  CHOL 110  TRIG 160*  HDL 34*  LDLCALC 44  CHOLHDL 3.2    Hematology Recent Labs  Lab 05/09/21 0042 05/10/21 0142  WBC 8.9 9.1  RBC 4.47 4.28  HGB 12.3 11.9*  HCT 39.6 36.7  MCV 88.6 85.7  MCH 27.5 27.8  MCHC 31.1 32.4  RDW 13.9 13.8  PLT 151 167   Thyroid No results for input(s): TSH, FREET4 in the last 168 hours.  BNP Recent Labs  Lab 05/09/21 0042  BNP 611.0*    DDimer  Recent Labs  Lab 05/09/21 0500  DDIMER 1.66*     Radiology    DG Chest Port 1 View  Result Date: 05/10/2021 CLINICAL DATA:  80 year old female with history of shortness of breath and respiratory distress. EXAM: PORTABLE CHEST 1 VIEW COMPARISON:  Chest x-ray 05/09/2021. FINDINGS: Lung volumes are low. No consolidative airspace disease. No pleural effusions. No pneumothorax. No pulmonary nodule or mass noted. Pulmonary vasculature and the cardiomediastinal silhouette are within normal limits. Atherosclerotic calcifications in the thoracic aorta. Status post median sternotomy for CABG including LIMA to the LAD. Multiple surgical clips are noted in the lower  left cervical region. Old healed right-sided rib fractures are again noted. Ossification of the coracoclavicular ligament and elevation of the distal right clavicle relative to the acromion, with old healed distal right clavicular fracture, related to remote trauma. IMPRESSION: 1. No radiographic evidence of acute cardiopulmonary disease. 2. Aortic atherosclerosis. Electronically Signed   By: Vinnie Langton M.D.   On: 05/10/2021 08:35   DG Chest Port 1 View  Result Date: 05/09/2021 CLINICAL DATA:  Shortness of breath EXAM: PORTABLE CHEST 1 VIEW COMPARISON:  03/25/2020 FINDINGS: Cardiac shadow is within normal limits. Postsurgical changes are noted. Lungs are well aerated bilaterally. Previously seen airspace opacities have resolved in the interval. No new focal abnormality is seen. IMPRESSION: No acute abnormality noted. Electronically Signed   By: Inez Catalina M.D.   On: 05/09/2021 00:58   ECHOCARDIOGRAM COMPLETE  Result Date: 05/09/2021    ECHOCARDIOGRAM REPORT   Patient Name:   Deanna [name] Date of Exam: 05/09/2021 Medical Rec #:  [id number]       Height:       61.0 in Accession #:    [id number]      Weight:       185.0 lb Date of Birth:  03/05/1941       BSA:          1.827 m Patient Age:    80 years        BP:           118/60 mmHg Patient Gender: F               HR:           82 bpm. Exam Location:  Inpatient Procedure: 2D Echo, Cardiac Doppler, Color Doppler and Intracardiac            Opacification Agent Indications:    NSTEMI I21.4                 CHF-Acute Systolic H82.99  History:        Patient has prior history of Echocardiogram examinations, most                 recent 03/19/2020. CHF, CAD and Previous Myocardial Infarction,                 PAD; Risk Factors:Sleep Apnea. Thyroid disease,  GERD.  Sonographer:    Darlina Sicilian RDCS Referring Phys: [id number] Icard  1. Left ventricular ejection fraction, by estimation, is 50 to 55%. The left ventricle has low normal  function. The left ventricle demonstrates regional wall motion abnormalities (see scoring diagram/findings for description). There is mild concentric left ventricular hypertrophy. Left ventricular diastolic parameters are indeterminate. Elevated left ventricular end-diastolic pressure. There is akinesis of the left ventricular, apical septal wall, inferior wall and inferolateral wall. There is akinesis of the left ventricular, entire apical segment. There is hypokinesis of the left ventricular, mid-apical anteroseptal wall.  2. Right ventricular systolic function is normal. The right ventricular size is normal.  3. The mitral valve is normal in structure. Mild mitral valve regurgitation. No evidence of mitral stenosis. Moderate mitral annular calcification.  4. The aortic valve is tricuspid. Aortic valve regurgitation is not visualized. Mild aortic valve sclerosis is present, with no evidence of aortic valve stenosis.  5. The inferior vena cava is normal in size with greater than 50% respiratory variability, suggesting right atrial pressure of 3 mmHg. FINDINGS  Left Ventricle: Left ventricular ejection fraction, by estimation, is 50 to 55%. The left ventricle has low normal function. The left ventricle demonstrates regional wall motion abnormalities. The left ventricular internal cavity size was normal in size. There is mild concentric left ventricular hypertrophy. Left ventricular diastolic parameters are indeterminate. Elevated left ventricular end-diastolic pressure. Right Ventricle: The right ventricular size is normal. No increase in right ventricular wall thickness. Right ventricular systolic function is normal. Left Atrium: Left atrial size was normal in size. Right Atrium: Right atrial size was normal in size. Pericardium: There is no evidence of pericardial effusion. Mitral Valve: The mitral valve is normal in structure. Moderate mitral annular calcification. Mild mitral valve regurgitation. No evidence of  mitral valve stenosis. Tricuspid Valve: The tricuspid valve is normal in structure. Tricuspid valve regurgitation is mild . No evidence of tricuspid stenosis. Aortic Valve: The aortic valve is tricuspid. Aortic valve regurgitation is not visualized. Mild aortic valve sclerosis is present, with no evidence of aortic valve stenosis. Pulmonic Valve: The pulmonic valve was normal in structure. Pulmonic valve regurgitation is trivial. No evidence of pulmonic stenosis. Aorta: The aortic root is normal in size and structure. Venous: The inferior vena cava is normal in size with greater than 50% respiratory variability, suggesting right atrial pressure of 3 mmHg. IAS/Shunts: No atrial level shunt detected by color flow Doppler.  LEFT VENTRICLE PLAX 2D LVIDd:         4.30 cm     Diastology LVIDs:         2.60 cm     LV e' medial:    6.15 cm/s LV PW:         1.10 cm     LV E/e' medial:  18.7 LV IVS:        1.20 cm     LV e' lateral:   8.03 cm/s LVOT diam:     1.70 cm     LV E/e' lateral: 14.3 LV SV:         40 LV SV Index:   22 LVOT Area:     2.27 cm  LV Volumes (MOD) LV vol d, MOD A2C: 71.7 ml LV vol d, MOD A4C: 80.0 ml LV vol s, MOD A2C: 39.1 ml LV vol s, MOD A4C: 35.6 ml LV SV MOD A2C:     32.6 ml LV SV MOD A4C:     80.0  ml LV SV MOD BP:      38.5 ml RIGHT VENTRICLE RV S prime:     8.55 cm/s TAPSE (M-mode): 1.3 cm LEFT ATRIUM             Index       RIGHT ATRIUM           Index LA diam:        4.10 cm 2.24 cm/m  RA Area:     10.70 cm LA Vol (A2C):   19.9 ml 10.89 ml/m RA Volume:   18.10 ml  9.91 ml/m LA Vol (A4C):   35.7 ml 19.54 ml/m LA Biplane Vol: 27.5 ml 15.05 ml/m  AORTIC VALVE LVOT Vmax:   65.50 cm/s LVOT Vmean:  47.400 cm/s LVOT VTI:    0.176 m  AORTA Ao Root diam: 2.70 cm MITRAL VALVE MV Area (PHT): 4.36 cm     SHUNTS MV Decel Time: 174 msec     Systemic VTI:  0.18 m MV E velocity: 115.00 cm/s  Systemic Diam: 1.70 cm MV A velocity: 121.00 cm/s MV E/A ratio:  0.95 Fransico Him MD Electronically signed by  Fransico Him MD Signature Date/Time: 05/09/2021/1:08:10 PM    Final     Cardiac Studies    Echo from 05/09/21:    1. Left ventricular ejection fraction, by estimation, is 50 to 55%. The  left ventricle has low normal function. The left ventricle demonstrates  regional wall motion abnormalities (see scoring diagram/findings for  description). There is mild concentric  left ventricular hypertrophy. Left ventricular diastolic parameters are  indeterminate. Elevated left ventricular end-diastolic pressure. There is  akinesis of the left ventricular, apical septal wall, inferior wall and  inferolateral wall. There is  akinesis of the left ventricular, entire apical segment. There is  hypokinesis of the left ventricular, mid-apical anteroseptal wall.   2. Right ventricular systolic function is normal. The right ventricular  size is normal.   3. The mitral valve is normal in structure. Mild mitral valve  regurgitation. No evidence of mitral stenosis. Moderate mitral annular  calcification.   4. The aortic valve is tricuspid. Aortic valve regurgitation is not  visualized. Mild aortic valve sclerosis is present, with no evidence of  aortic valve stenosis.   5. The inferior vena cava is normal in size with greater than 50%  respiratory variability, suggesting right atrial pressure of 3 mmHg.    Cardiac Cath 03/2020  Ost LAD to Prox LAD lesion is 99% stenosed. LIMA to LAD known to be atretic. After orbital atherectomy, A drug-eluting stent was successfully placed using a STENT RESOLUTE ONYX 3.5X15. Post intervention, there is a 0% residual stenosis. Mid LAD lesion is 100% stenosed. Despite atherectomy and angioplasty, flow could not be restored. Mid Cx lesion is 100% stenosed. SVG to OM patent. Prox RCA lesion is 100% stenosed. SVG to PDA occluded. There is moderate left ventricular systolic dysfunction. LV end diastolic pressure is moderately elevated. The left ventricular ejection fraction  is 25-35% by visual estimate. There is no aortic valve stenosis. Balloon angioplasty was performed using a BALLOON SAPPHIRE 2.0X12.   Complex intervention due to severe three-vessel coronary artery disease with prior bypass surgery.  Successful atherectomy and stenting of the proximal LAD.  Unable to restore flow through the mid LAD.  I suspect, she is a somewhat late presenting infarct.  She already has evidence of heart failure with elevated LVEDP.  Will need ongoing medical therapy to help her LV dysfunction.  2D echo 03/2020  1. Left ventricular ejection fraction, by estimation, is 35 to 40%. The  left ventricle has moderately decreased function. The left ventricle  demonstrates regional wall motion abnormalities (see scoring  diagram/findings for description). Left ventricular   diastolic parameters are consistent with Grade II diastolic dysfunction  (pseudonormalization). There is severe akinesis of the left ventricular,  mid-apical anteroseptal wall.   2. Right ventricular systolic function is mildly reduced. The right  ventricular size is normal.   3. The mitral valve is grossly normal. Mild mitral valve regurgitation.   4. The aortic valve is normal in structure. Aortic valve regurgitation is  not visualized. No aortic stenosis is present.     Patient Profile     EBBA GOLL is a 81 y.o. female with a hx of CAD (MI with PTCA to LAD 1994, PTCA to [place], eventual CABG 1999 with grown graft closure 2/4), CKD IV, paroxysmal atrial fibrillation, PAD with L-R femfem crossover, also L subclavian artery stenosis with L common carotid to subclavian artery bypass, celiac artery stenosis, cognitive impairment, TBI, depression, anxiety, hearing loss, GERD, HTN, hypothyroidism, HLD, OSA (not using CPAP), incontinence, prior sepsis who is being seen 05/09/2021 for the evaluation of elevated troponin.   Assessment & Plan    Acute hypoxic respiratory failure  - occurred as a sequelae of  2 weeks of productive cough and recent URI - CXR without acute disease - VQ scan and pneumonia workup per IM    CAD s/p prior MIs, PCIs, CABG, with elevated troponin - low level elevation from [phone] - continue ASA, Plavix, metoprolol, statin, IV heparin started by IM  - Echo from 10/2 showed EF 50-55%, + WMA, elevated LVEDP, mild MR, mild aortic sclerosis  - seen by Dr Domenic Polite yesterday, felt inconsistent with ACS and no further ischemic workup recommended   Chronic combined CHF  - CXR no acute findings, POA  - Echo 10/2 EF 50-55%, + WMA, EF improved comparing to 2021, elevated LVEDP  - s/p IV Lasix 40mg  x1 on 05/09/21, clinically euvolemic today - on Lasix 40mg  daily at home, will resume as renal index improved/near baseline today    CKD stage IV - Cr 1.96 in 03/2021, admitted at 2.2->2.09, now downtrend to 1.65, baseline appears around 1.5-1.6 ranges over the past year, near baseline  - will resume home dose Lasix 40mg  daily today, continue hold valsartan and farxiga given CKD III- IV   Paroxysmal atrial fibrillation occurred in 2021 - no evidence of recurrence so far  - not on anticoagulation historically due to brief duration of A fib  Mild memory loss  - noted on exam today, defer work up to IM      For questions or updates, please contact [place] Please consult [email or web address] for contact info under        Signed, Margie Billet, NP  05/10/2021, 11:46 AM

## 2021-05-10 NOTE — Progress Notes (Signed)
PT Cancellation Note  Patient Details Name: Deanna [name] MRN: [id number] DOB: 1940-09-08   Cancelled Treatment:    Reason Eval/Treat Not Completed: Medical issues which prohibited therapy Pt with upward trending troponins and awaiting further cardiology recommendations. Will hold until pt medically appropriate and follow up as schedule allows.   Reuel Derby, PT, DPT  Acute Rehabilitation Services  Pager: [phone] Office: [phone]    Rudean Hitt 05/10/2021, 10:15 AM

## 2021-05-10 NOTE — Progress Notes (Signed)
PROGRESS NOTE    Deanna [name]  ERX:[id number] DOB: 17-Sep-1940 DOA: 05/09/2021 PCP: Lauree Chandler, NP   Chief Complaint  Patient presents with   Respiratory Distress    Brief Narrative/Hospital Course: 80 year old female with CAD, CABG with PCI, chronic combined CHF PAF not on anticoagulation PAD left to right femorofemoral crossover/left subclavian artery stenosis/ left common carotid to subclavian artery bypass/celiac artery stenosis, CKD stage IIIb, hypothyroidism, GERD, HTN, HLD, OSA on CPAP -quit 3 years  ago, poor historian, anxiety/depression presents to the ED  w/ cold/sinus infection x 2 to 3 weeks at onset leading up with her respiratory status prior to admission with chest discomfort tightness/pain. In the ED was having coughing spells vitals with tachycardia oxygen saturation in mid 80s on room air placed on 2 L lab with creatinine 2.2 baseline 1.5, BNP 611 troponin initially 45 further repeat was high EKG no acute ischemia, D-dimer was positive COVID-19 negative chest x-ray no acute finding patient was admitted. Troponin 45> 137> 375> 273, cardio was consulted D-dimer positive at 1.66, VQ scan ordered.  Patient was managed by cardiology consult, IV heparin infusion.  Subjective: Seen examined Alert awake Overnight no fever, on 2 L nasal cannula Labs with creatinine 1.6 " Too early this am; mildly confused Oriented to self,months- thinks it is 2002 "I do not keep up with the news and dates, do nto go to work, just stay at home" Is redirectable, pleasant.No chest pain shortness of breath.  Assessment & Plan:  Acute hypoxic respiratory failure Respiratory distress Recent URI x2 weeks: Suspecting due to acute bronchitis, but with elevated troponin see below.  Also with positive D-dimer VQ scan pending.  Checked procalcitonin-minimal at 0.51- add po augmentin. Recent Labs  Lab 05/09/21 0042 05/10/21 0142 05/10/21 0459  WBC 8.9 9.1  --   PROCALCITON  --   --  0.52    Elevated D-dimer 1.6 VQ scan pending unable to do CT angio due to her CKD  Chronic combined CHF: BNP 611, chest x-ray no acute finding. Cardio feels not in acute chf. Ef normal on TTE.  Resume Lasix per audiology.  Chest pain/tightness Elevated troponin 45> 137> 375> 273: Demand mismatch TTE EF 50-55%, Shows Regional Wall Motion Abnormalities, Acanthus of the Left Ventricle Apical Septal Wall, Inferior Wall and Inferolateral Wall and Other See Report.  Cardiology on board, suspecting demand ischemia, not anticipating ischemic work-up further.  Follow-up VQ scan and other work-up as above. 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  Continue aspirin statin and also Plavix per cardiology.  PAF not on anticoagulation: Brief course of A. fib following cardiac cath last year no recurrence of A. fib did not feel need for anticoagulation per cardiology at that time.  Continue metoprolol  AKI on CKD stage IIIb: Peaked to 2.2 now downtrending 1.6 baseline Recent Labs    12/07/20 1356 04/07/21 1457 05/09/21 0042 05/09/21 0759 05/10/21 0142  BUN 22 38* 20 21 28*  CREATININE 1.61* 1.96* 2.20* 2.09* 1.65*     Hypothyroidism: Continue her Synthroid  GERD: Continue PPI  OSA on CPAP -quit 3 years  ago  Poor historian? MCI- not fully oriented bu redirectable,  Anxiety/depression: Mood is stable continue Lexapro and Xanax as needed  Obesity Class II Patient's Body  mass index is 34.96 kg/m. : Will benefit with PCP follow-up, weight loss healthy lifestyle and outpatient sleep evaluation.  Diet Order             Diet Heart Room service appropriate? Yes; Fluid consistency: Thin  Diet effective now                  DVT  prophylaxis: heparin gtt Code Status:   Code Status: Full Code  Family Communication: plan of care discussed with patient at bedside. Status is: Inpatient Remains inpatient appropriate because:IV treatments appropriate due to intensity of illness or inability to take PO and Inpatient level of care appropriate due to severity of illness Dispo: The patient is from: Home, LIVES with Daughter              Anticipated d/c is to:  TBD PT OT evaluation after VQ scan today              Patient currently is not medically stable to d/c.   Difficult to place patient No  Objective: Vitals: Today's Vitals   05/10/21 0425 05/10/21 0836 05/10/21 1118 05/10/21 1136  BP: 115/67 (!) 120/58 (!) 137/51 135/63  Pulse: 72 69 75 70  Resp: 14 13 16 15   Temp: 97.7 F (36.5 C) (!) 97.5 F (36.4 C) 98.5 F (36.9 C) 98.5 F (36.9 C)  TempSrc: Oral Oral Oral Oral  SpO2: 96% 97% 98% 97%  Weight:      Height:      PainSc:    0-No pain   Physical Examination: General exam: Aa0x2, weak,older than stated age. HEENT:Oral mucosa moist, Ear/Nose WNL grossly,dentition normal. Respiratory system: B/l clear, no use of accessory muscle, non tender. Cardiovascular system: S1 & S2 +,No JVD. Gastrointestinal system: Abdomen soft, NT,ND, BS+. Nervous System:Alert, awake, moving extremities. Extremities: Leg edema none, distal peripheral pulses palpable.  Skin: No rashes, no icterus. MSK: Normal muscle bulk,tone, power.  Medications reviewed:  Scheduled Meds:  aspirin EC  81 mg Oral QHS   clopidogrel  75 mg Oral Daily   divalproex  250 mg Oral QHS   dorzolamide  1 drop Both Eyes BID   escitalopram  20 mg Oral Daily   gabapentin  100 mg Oral BID   latanoprost  1 drop Both Eyes QHS   levothyroxine  175 mcg Oral Daily   metoprolol tartrate  12.5 mg Oral BID   pantoprazole  40 mg Oral Daily   rosuvastatin  40 mg Oral QHS   Continuous Infusions:  heparin 1,300 Units/hr (05/10/21 0531)     Intake/Output  Intake/Output Summary (Last 24 hours) at 05/10/2021 1409 Last data filed at 05/09/2021 1835 Gross per 24 hour  Intake 145.56 ml  Output --  Net 145.56 ml   Intake/Output from previous day: 10/02 0701 - 10/03 0700 In: 145.6 [I.V.:145.6] Out: 2050 [Urine:2050] Net IO Since Admission: -1,904.44 mL [05/10/21 1409]   Weight change:   Wt Readings from Last 3 Encounters:  05/09/21 83.9 kg  04/07/21 88 kg  12/07/20 88 kg     Consultants:see note  Procedures:see note Antimicrobials: Anti-infectives (From admission, onward)    None      Culture/Microbiology    Component Value Date/Time   SDES BLOOD RIGHT WRIST 03/22/2020 0440   SPECREQUEST  03/22/2020 0440    BOTTLES DRAWN AEROBIC ONLY Blood Culture adequate volume   CULT  03/22/2020 0440    NO GROWTH 5 DAYS Performed at [facility], [place]  [address] [place], [place] [address]    REPTSTATUS 03/27/2020 FINAL 03/22/2020 0440    Other culture-see note  Unresulted Labs (From admission, onward)     Start     Ordered   05/11/21 0500  Heparin level (unfractionated)  Daily,   R      05/09/21 1705   05/11/21 0500  Procalcitonin  Daily,   R     Question:  Specimen collection method  Answer:  Lab=Lab collect   05/10/21 0807   05/10/21 0500  CBC  Daily,   R      05/09/21 0647            Data Reviewed: I have personally reviewed following labs and imaging studies CBC: Recent Labs  Lab 05/09/21 0042 05/10/21 0142  WBC 8.9 9.1  NEUTROABS 6.8  --   HGB 12.3 11.9*  HCT 39.6 36.7  MCV 88.6 85.7  PLT 151 259   Basic Metabolic Panel: Recent Labs  Lab 05/09/21 0042 05/09/21 0759 05/10/21 0142  NA 138 139 136  K 3.4* 4.3 4.4  CL 105 104 103  CO2 22 21* 23  GLUCOSE 170* 215* 114*  BUN 20 21 28*  CREATININE 2.20* 2.09* 1.65*  CALCIUM 8.9 9.2 9.0  MG  --  1.8  --    GFR: Estimated Creatinine Clearance: 27.1 mL/min (A) (by C-G formula based on SCr of 1.65 mg/dL (H)). Liver Function  Tests: Recent Labs  Lab 05/09/21 0042  AST 16  ALT 11  ALKPHOS 66  BILITOT 1.1  PROT 6.2*  ALBUMIN 3.5   No results for input(s): LIPASE, AMYLASE in the last 168 hours. No results for input(s): AMMONIA in the last 168 hours. Coagulation Profile: No results for input(s): INR, PROTIME in the last 168 hours. Cardiac Enzymes: No results for input(s): CKTOTAL, CKMB, CKMBINDEX, TROPONINI in the last 168 hours. BNP (last 3 results) No results for input(s): PROBNP in the last 8760 hours. HbA1C: No results for input(s): HGBA1C in the last 72 hours. CBG: No results for input(s): GLUCAP in the last 168 hours. Lipid Profile: Recent Labs    05/10/21 0142  CHOL 110  HDL 34*  LDLCALC 44  TRIG 160*  CHOLHDL 3.2   Thyroid Function Tests: No results for input(s): TSH, T4TOTAL, FREET4, T3FREE, THYROIDAB in the last 72 hours. Anemia Panel: No results for input(s): VITAMINB12, FOLATE, FERRITIN, TIBC, IRON, RETICCTPCT in the last 72 hours. Sepsis Labs: Recent Labs  Lab 05/10/21 0459  PROCALCITON 0.52    Recent Results (from the past 240 hour(s))  Resp Panel by RT-PCR (Flu A&B, Covid) Nasopharyngeal Swab     Status: None   Collection Time: 05/09/21 12:49 AM   Specimen: Nasopharyngeal Swab; Nasopharyngeal(NP) swabs in vial transport medium  Result Value Ref Range Status   SARS Coronavirus 2 by RT PCR NEGATIVE NEGATIVE Final    Comment: (NOTE) SARS-CoV-2 target nucleic acids are NOT DETECTED.  The SARS-CoV-2 RNA is generally detectable in upper respiratory specimens during the acute phase of infection. The lowest concentration of SARS-CoV-2 viral copies this assay can detect is 138 copies/mL. A negative result does not preclude SARS-Cov-2 infection and should not be used as the sole basis for treatment or other patient management decisions. A negative result may occur with  improper specimen collection/handling, submission of specimen other than nasopharyngeal swab, presence of  viral mutation(s) within the areas targeted by this assay, and inadequate number of viral copies(<138 copies/mL). A negative result must be combined with clinical observations,  patient history, and epidemiological information. The expected result is Negative.  Fact Sheet for Patients:  EntrepreneurPulse.com.au  Fact Sheet for Healthcare Providers:  IncredibleEmployment.be  This test is no t yet approved or cleared by the [place] FDA and  has been authorized for detection and/or diagnosis of SARS-CoV-2 by FDA under an Emergency Use Authorization (EUA). This EUA will remain  in effect (meaning this test can be used) for the duration of the COVID-19 declaration under Section 564(b)(1) of the Act, 21 U.S.C.section 360bbb-3(b)(1), unless the authorization is terminated  or revoked sooner.       Influenza A by PCR NEGATIVE NEGATIVE Final   Influenza B by PCR NEGATIVE NEGATIVE Final    Comment: (NOTE) The Xpert Xpress SARS-CoV-2/FLU/RSV plus assay is intended as an aid in the diagnosis of influenza from Nasopharyngeal swab specimens and should not be used as a sole basis for treatment. Nasal washings and aspirates are unacceptable for Xpert Xpress SARS-CoV-2/FLU/RSV testing.  Fact Sheet for Patients: EntrepreneurPulse.com.au  Fact Sheet for Healthcare Providers: IncredibleEmployment.be  This test is not yet approved or cleared by the [place] FDA and has been authorized for detection and/or diagnosis of SARS-CoV-2 by FDA under an Emergency Use Authorization (EUA). This EUA will remain in effect (meaning this test can be used) for the duration of the COVID-19 declaration under Section 564(b)(1) of the Act, 21 U.S.C. section 360bbb-3(b)(1), unless the authorization is terminated or revoked.  Performed at [facility], [place] [address] [place], [place] [address]      Radiology Studies: DG  Chest [place] 1 View  Result Date: 05/10/2021 CLINICAL DATA:  80 year old female with history of shortness of breath and respiratory distress. EXAM: PORTABLE CHEST 1 VIEW COMPARISON:  Chest x-ray 05/09/2021. FINDINGS: Lung volumes are low. No consolidative airspace disease. No pleural effusions. No pneumothorax. No pulmonary nodule or mass noted. Pulmonary vasculature and the cardiomediastinal silhouette are within normal limits. Atherosclerotic calcifications in the thoracic aorta. Status post median sternotomy for CABG including LIMA to the LAD. Multiple surgical clips are noted in the lower left cervical region. Old healed right-sided rib fractures are again noted. Ossification of the coracoclavicular ligament and elevation of the distal right clavicle relative to the acromion, with old healed distal right clavicular fracture, related to remote trauma. IMPRESSION: 1. No radiographic evidence of acute cardiopulmonary disease. 2. Aortic atherosclerosis. Electronically Signed   By: Vinnie Langton M.D.   On: 05/10/2021 08:35   DG Chest Port 1 View  Result Date: 05/09/2021 CLINICAL DATA:  Shortness of breath EXAM: PORTABLE CHEST 1 VIEW COMPARISON:  03/25/2020 FINDINGS: Cardiac shadow is within normal limits. Postsurgical changes are noted. Lungs are well aerated bilaterally. Previously seen airspace opacities have resolved in the interval. No new focal abnormality is seen. IMPRESSION: No acute abnormality noted. Electronically Signed   By: Inez Catalina M.D.   On: 05/09/2021 00:58   ECHOCARDIOGRAM COMPLETE  Result Date: 05/09/2021    ECHOCARDIOGRAM REPORT   Patient Name:   Deanna [name] Date of Exam: 05/09/2021 Medical Rec #:  [id number]       Height:       61.0 in Accession #:    [id number]      Weight:       185.0 lb Date of Birth:  1940-10-22       BSA:          1.827 m Patient Age:    84 years        BP:  118/60 mmHg Patient Gender: F               HR:           82 bpm. Exam Location:  Inpatient  Procedure: 2D Echo, Cardiac Doppler, Color Doppler and Intracardiac            Opacification Agent Indications:    NSTEMI I21.4                 CHF-Acute Systolic M76.72  History:        Patient has prior history of Echocardiogram examinations, most                 recent 03/19/2020. CHF, CAD and Previous Myocardial Infarction,                 PAD; Risk Factors:Sleep Apnea. Thyroid disease, GERD.  Sonographer:    Darlina Sicilian RDCS Referring Phys: [id number] [facility]  1. Left ventricular ejection fraction, by estimation, is 50 to 55%. The left ventricle has low normal function. The left ventricle demonstrates regional wall motion abnormalities (see scoring diagram/findings for description). There is mild concentric left ventricular hypertrophy. Left ventricular diastolic parameters are indeterminate. Elevated left ventricular end-diastolic pressure. There is akinesis of the left ventricular, apical septal wall, inferior wall and inferolateral wall. There is akinesis of the left ventricular, entire apical segment. There is hypokinesis of the left ventricular, mid-apical anteroseptal wall.  2. Right ventricular systolic function is normal. The right ventricular size is normal.  3. The mitral valve is normal in structure. Mild mitral valve regurgitation. No evidence of mitral stenosis. Moderate mitral annular calcification.  4. The aortic valve is tricuspid. Aortic valve regurgitation is not visualized. Mild aortic valve sclerosis is present, with no evidence of aortic valve stenosis.  5. The inferior vena cava is normal in size with greater than 50% respiratory variability, suggesting right atrial pressure of 3 mmHg. FINDINGS  Left Ventricle: Left ventricular ejection fraction, by estimation, is 50 to 55%. The left ventricle has low normal function. The left ventricle demonstrates regional wall motion abnormalities. The left ventricular internal cavity size was normal in size. There is mild  concentric left ventricular hypertrophy. Left ventricular diastolic parameters are indeterminate. Elevated left ventricular end-diastolic pressure. Right Ventricle: The right ventricular size is normal. No increase in right ventricular wall thickness. Right ventricular systolic function is normal. Left Atrium: Left atrial size was normal in size. Right Atrium: Right atrial size was normal in size. Pericardium: There is no evidence of pericardial effusion. Mitral Valve: The mitral valve is normal in structure. Moderate mitral annular calcification. Mild mitral valve regurgitation. No evidence of mitral valve stenosis. Tricuspid Valve: The tricuspid valve is normal in structure. Tricuspid valve regurgitation is mild . No evidence of tricuspid stenosis. Aortic Valve: The aortic valve is tricuspid. Aortic valve regurgitation is not visualized. Mild aortic valve sclerosis is present, with no evidence of aortic valve stenosis. Pulmonic Valve: The pulmonic valve was normal in structure. Pulmonic valve regurgitation is trivial. No evidence of pulmonic stenosis. Aorta: The aortic root is normal in size and structure. Venous: The inferior vena cava is normal in size with greater than 50% respiratory variability, suggesting right atrial pressure of 3 mmHg. IAS/Shunts: No atrial level shunt detected by color flow Doppler.  LEFT VENTRICLE PLAX 2D LVIDd:         4.30 cm     Diastology LVIDs:         2.60 cm  LV e' medial:    6.15 cm/s LV PW:         1.10 cm     LV E/e' medial:  18.7 LV IVS:        1.20 cm     LV e' lateral:   8.03 cm/s LVOT diam:     1.70 cm     LV E/e' lateral: 14.3 LV SV:         40 LV SV Index:   22 LVOT Area:     2.27 cm  LV Volumes (MOD) LV vol d, MOD A2C: 71.7 ml LV vol d, MOD A4C: 80.0 ml LV vol s, MOD A2C: 39.1 ml LV vol s, MOD A4C: 35.6 ml LV SV MOD A2C:     32.6 ml LV SV MOD A4C:     80.0 ml LV SV MOD BP:      38.5 ml RIGHT VENTRICLE RV S prime:     8.55 cm/s TAPSE (M-mode): 1.3 cm LEFT ATRIUM              Index       RIGHT ATRIUM           Index LA diam:        4.10 cm 2.24 cm/m  RA Area:     10.70 cm LA Vol (A2C):   19.9 ml 10.89 ml/m RA Volume:   18.10 ml  9.91 ml/m LA Vol (A4C):   35.7 ml 19.54 ml/m LA Biplane Vol: 27.5 ml 15.05 ml/m  AORTIC VALVE LVOT Vmax:   65.50 cm/s LVOT Vmean:  47.400 cm/s LVOT VTI:    0.176 m  AORTA Ao Root diam: 2.70 cm MITRAL VALVE MV Area (PHT): 4.36 cm     SHUNTS MV Decel Time: 174 msec     Systemic VTI:  0.18 m MV E velocity: 115.00 cm/s  Systemic Diam: 1.70 cm MV A velocity: 121.00 cm/s MV E/A ratio:  0.95 Fransico Him MD Electronically signed by Fransico Him MD Signature Date/Time: 05/09/2021/1:08:10 PM    Final      LOS: 1 day   Antonieta Pert, MD [facility]  05/10/2021, 2:09 PM

## 2021-05-11 DIAGNOSIS — J9601 Acute respiratory failure with hypoxia: Secondary | ICD-10-CM | POA: Diagnosis not present

## 2021-05-11 DIAGNOSIS — I2510 Atherosclerotic heart disease of native coronary artery without angina pectoris: Secondary | ICD-10-CM | POA: Diagnosis not present

## 2021-05-11 DIAGNOSIS — I214 Non-ST elevation (NSTEMI) myocardial infarction: Secondary | ICD-10-CM | POA: Diagnosis not present

## 2021-05-11 DIAGNOSIS — N179 Acute kidney failure, unspecified: Secondary | ICD-10-CM | POA: Diagnosis not present

## 2021-05-11 LAB — BASIC METABOLIC PANEL
Anion gap: 9 (ref 5–15)
BUN: 34 mg/dL — ABNORMAL HIGH (ref 8–23)
CO2: 26 mmol/L (ref 22–32)
Calcium: 9.1 mg/dL (ref 8.9–10.3)
Chloride: 102 mmol/L (ref 98–111)
Creatinine, Ser: 1.73 mg/dL — ABNORMAL HIGH (ref 0.44–1.00)
GFR, Estimated: 30 mL/min — ABNORMAL LOW (ref >60)
Glucose, Bld: 98 mg/dL (ref 70–99)
Potassium: 4.4 mmol/L (ref 3.5–5.1)
Sodium: 137 mmol/L (ref 135–145)

## 2021-05-11 LAB — CBC
HCT: 36.2 % (ref 36.0–46.0)
Hemoglobin: 11.6 g/dL — ABNORMAL LOW (ref 12.0–15.0)
MCH: 27.6 pg (ref 26.0–34.0)
MCHC: 32 g/dL (ref 30.0–36.0)
MCV: 86 fL (ref 80.0–100.0)
Platelets: 165 10*3/uL (ref 150–400)
RBC: 4.21 MIL/uL (ref 3.87–5.11)
RDW: 13.9 % (ref 11.5–15.5)
WBC: 6.7 10*3/uL (ref 4.0–10.5)
nRBC: 0 % (ref 0.0–0.2)

## 2021-05-11 MED ORDER — FUROSEMIDE 40 MG PO TABS
40.0000 mg | ORAL_TABLET | Freq: Every day | ORAL | Status: DC
  Administered 2021-05-11: 40 mg via ORAL
  Filled 2021-05-11: qty 1

## 2021-05-11 MED ORDER — AMOXICILLIN-POT CLAVULANATE 500-125 MG PO TABS: 1.0000 | ORAL_TABLET | Freq: Two times a day (BID) | ORAL | 0 refills | Status: AC

## 2021-05-11 NOTE — Consult Note (Addendum)
   [facility] Inpatient Consult   05/11/2021  Deanna [name] [birth year] [id number]   [facility] [ACO] Patient: [employer]  Primary Care Provider:  Lauree Chandler, NP, [facility] is listed to provide the transition of care follow up   Patient screened for hospitalization with a history with [facility] service  post hospital transition needs. Patient in chair with visitor ready for transitioning home.  Review of patient's medical record reveals patient is for home and spoke with inpatient [facility] RN and no needs at this time. She is currently at a medium risk for readmission.  Plan:  PCP to provide the [facility] follow up appointment and calls. 05/11/21 4:30 pm Late entry:  Call received from inpatient [employer] RNCM regarding potential follow up with [facility] needs, with new oxygen orders and community support.  05/12/21 9:30 am Call placed to patient and daughter [name] answered stating patient is still asleep and don't want to awaken her.  Explained [facility] Management for post hospital support and follow up.  She states she would want the support.  Will assign [facility] for follow up. For questions contact:   Natividad Brood, RN BSN [facility] Liaison  [phone] business mobile phone Toll free office [phone]  Fax number: [phone] [email or web address]

## 2021-05-11 NOTE — Plan of Care (Signed)
Follow-up with cardiology arranged on 06/02/2021, please see AVS for info

## 2021-05-11 NOTE — Progress Notes (Signed)
Progress Note  Patient Name: Deanna [name] Date of Encounter: 05/11/2021  [facility] Cardiologist: Quay Burow, MD   Subjective   She is much more energetic today, states she is hungry and wants to eat her breakfast. She denied any chest pain or SOB. She is off oxygen support, pox 92% with conversation, denied any fever or chills or cough.    Inpatient Medications    Scheduled Meds:  amoxicillin-clavulanate  1 tablet Oral BID WC   aspirin EC  81 mg Oral QHS   clopidogrel  75 mg Oral Daily   divalproex  250 mg Oral QHS   dorzolamide  1 drop Both Eyes BID   escitalopram  20 mg Oral Daily   furosemide  40 mg Oral Daily   gabapentin  100 mg Oral BID   latanoprost  1 drop Both Eyes QHS   levothyroxine  175 mcg Oral Daily   metoprolol tartrate  12.5 mg Oral BID   pantoprazole  40 mg Oral Daily   rosuvastatin  40 mg Oral QHS   Continuous Infusions:   PRN Meds: albuterol, ALPRAZolam, guaiFENesin-dextromethorphan   Vital Signs    Vitals:   05/10/21 1118 05/10/21 1136 05/10/21 2206 05/11/21 0357  BP: (!) 137/51 135/63 120/68 (!) 129/56  Pulse: 75 70 75 72  Resp: 16 15 18 16   Temp: 98.5 F (36.9 C) 98.5 F (36.9 C) 97.8 F (36.6 C) 98.1 F (36.7 C)  TempSrc: Oral Oral Oral Oral  SpO2: 98% 97% 98% 90%  Weight:      Height:        Intake/Output Summary (Last 24 hours) at 05/11/2021 0913 Last data filed at 05/11/2021 0600 Gross per 24 hour  Intake 954.47 ml  Output 500 ml  Net 454.47 ml   Last 3 Weights 05/09/2021 04/07/2021 12/07/2020  Weight (lbs) 185 lb 194 lb 194 lb  Weight (kg) 83.915 kg 87.998 kg 87.998 kg  Some encounter information is confidential and restricted. Go to Review Flowsheets activity to see all data.      Telemetry    Sinus rhythm 70s  - Personally Reviewed  ECG    N/A this AM  - Personally Reviewed  Physical Exam   GEN: No acute distress.  Neck: No JVD Cardiac: RRR, no murmurs, rubs, or gallops.  Respiratory: Clear to  auscultation bilaterally.On room air. Speaks full sentence. Pox 92%.  GI: Soft, nontender, non-distended  MS: Trace BLE edema; No deformity. Neuro:  Alert and oriented to place/person, mild memory loss, follow commands, global weakness   Psych: Normal affect   Labs    High Sensitivity Troponin:   Recent Labs  Lab 05/09/21 0042 05/09/21 0435 05/09/21 1754 05/10/21 0459  TROPONINIHS 45* 137* 375* 273*     Chemistry Recent Labs  Lab 05/09/21 0042 05/09/21 0759 05/10/21 0142 05/11/21 0338  NA 138 139 136 137  K 3.4* 4.3 4.4 4.4  CL 105 104 103 102  CO2 22 21* 23 26  GLUCOSE 170* 215* 114* 98  BUN 20 21 28* 34*  CREATININE 2.20* 2.09* 1.65* 1.73*  CALCIUM 8.9 9.2 9.0 9.1  MG  --  1.8  --   --   PROT 6.2*  --   --   --   ALBUMIN 3.5  --   --   --   AST 16  --   --   --   ALT 11  --   --   --   ALKPHOS 66  --   --   --  BILITOT 1.1  --   --   --   GFRNONAA 22* 24* 31* 30*  ANIONGAP 11 14 10 9     Lipids  Recent Labs  Lab 05/10/21 0142  CHOL 110  TRIG 160*  HDL 34*  LDLCALC 44  CHOLHDL 3.2    Hematology Recent Labs  Lab 05/09/21 0042 05/10/21 0142 05/11/21 0338  WBC 8.9 9.1 6.7  RBC 4.47 4.28 4.21  HGB 12.3 11.9* 11.6*  HCT 39.6 36.7 36.2  MCV 88.6 85.7 86.0  MCH 27.5 27.8 27.6  MCHC 31.1 32.4 32.0  RDW 13.9 13.8 13.9  PLT 151 167 165   Thyroid No results for input(s): TSH, FREET4 in the last 168 hours.  BNP Recent Labs  Lab 05/09/21 0042  BNP 611.0*    DDimer  Recent Labs  Lab 05/09/21 0500  DDIMER 1.66*     Radiology    NM Pulmonary Perfusion  Result Date: 05/10/2021 CLINICAL DATA:  Chest pain and shortness of breath since admission, history of smoking, coronary artery disease post bypass surgery EXAM: NUCLEAR MEDICINE PERFUSION LUNG SCAN TECHNIQUE: Perfusion images were obtained in multiple projections after intravenous injection of radiopharmaceutical. Ventilation scans intentionally deferred if perfusion scan and chest x-ray adequate  for interpretation during COVID 19 epidemic. RADIOPHARMACEUTICALS:  3.8 mCi Tc-19m MAA IV COMPARISON:  Chest radiograph 05/10/2021 FINDINGS: Normal perfusion lung scan. No perfusion defects identified. IMPRESSION: Normal perfusion lung scan; pulmonary embolism absent. Electronically Signed   By: Lavonia Dana M.D.   On: 05/10/2021 16:51   DG Chest Port 1 View  Result Date: 05/10/2021 CLINICAL DATA:  80 year old female with history of shortness of breath and respiratory distress. EXAM: PORTABLE CHEST 1 VIEW COMPARISON:  Chest x-ray 05/09/2021. FINDINGS: Lung volumes are low. No consolidative airspace disease. No pleural effusions. No pneumothorax. No pulmonary nodule or mass noted. Pulmonary vasculature and the cardiomediastinal silhouette are within normal limits. Atherosclerotic calcifications in the thoracic aorta. Status post median sternotomy for CABG including LIMA to the LAD. Multiple surgical clips are noted in the lower left cervical region. Old healed right-sided rib fractures are again noted. Ossification of the coracoclavicular ligament and elevation of the distal right clavicle relative to the acromion, with old healed distal right clavicular fracture, related to remote trauma. IMPRESSION: 1. No radiographic evidence of acute cardiopulmonary disease. 2. Aortic atherosclerosis. Electronically Signed   By: Vinnie Langton M.D.   On: 05/10/2021 08:35   ECHOCARDIOGRAM COMPLETE  Result Date: 05/09/2021    ECHOCARDIOGRAM REPORT   Patient Name:   Deanna [name] Date of Exam: 05/09/2021 Medical Rec #:  [id number]       Height:       61.0 in Accession #:    [id number]      Weight:       185.0 lb Date of Birth:  1940/10/12       BSA:          1.827 m Patient Age:    75 years        BP:           118/60 mmHg Patient Gender: F               HR:           82 bpm. Exam Location:  Inpatient Procedure: 2D Echo, Cardiac Doppler, Color Doppler and Intracardiac            Opacification Agent Indications:    NSTEMI  I21.4  CHF-Acute Systolic Z36.64  History:        Patient has prior history of Echocardiogram examinations, most                 recent 03/19/2020. CHF, CAD and Previous Myocardial Infarction,                 PAD; Risk Factors:Sleep Apnea. Thyroid disease, GERD.  Sonographer:    Darlina Sicilian RDCS Referring Phys: [id number] [place]  1. Left ventricular ejection fraction, by estimation, is 50 to 55%. The left ventricle has low normal function. The left ventricle demonstrates regional wall motion abnormalities (see scoring diagram/findings for description). There is mild concentric left ventricular hypertrophy. Left ventricular diastolic parameters are indeterminate. Elevated left ventricular end-diastolic pressure. There is akinesis of the left ventricular, apical septal wall, inferior wall and inferolateral wall. There is akinesis of the left ventricular, entire apical segment. There is hypokinesis of the left ventricular, mid-apical anteroseptal wall.  2. Right ventricular systolic function is normal. The right ventricular size is normal.  3. The mitral valve is normal in structure. Mild mitral valve regurgitation. No evidence of mitral stenosis. Moderate mitral annular calcification.  4. The aortic valve is tricuspid. Aortic valve regurgitation is not visualized. Mild aortic valve sclerosis is present, with no evidence of aortic valve stenosis.  5. The inferior vena cava is normal in size with greater than 50% respiratory variability, suggesting right atrial pressure of 3 mmHg. FINDINGS  Left Ventricle: Left ventricular ejection fraction, by estimation, is 50 to 55%. The left ventricle has low normal function. The left ventricle demonstrates regional wall motion abnormalities. The left ventricular internal cavity size was normal in size. There is mild concentric left ventricular hypertrophy. Left ventricular diastolic parameters are indeterminate. Elevated left ventricular  end-diastolic pressure. Right Ventricle: The right ventricular size is normal. No increase in right ventricular wall thickness. Right ventricular systolic function is normal. Left Atrium: Left atrial size was normal in size. Right Atrium: Right atrial size was normal in size. Pericardium: There is no evidence of pericardial effusion. Mitral Valve: The mitral valve is normal in structure. Moderate mitral annular calcification. Mild mitral valve regurgitation. No evidence of mitral valve stenosis. Tricuspid Valve: The tricuspid valve is normal in structure. Tricuspid valve regurgitation is mild . No evidence of tricuspid stenosis. Aortic Valve: The aortic valve is tricuspid. Aortic valve regurgitation is not visualized. Mild aortic valve sclerosis is present, with no evidence of aortic valve stenosis. Pulmonic Valve: The pulmonic valve was normal in structure. Pulmonic valve regurgitation is trivial. No evidence of pulmonic stenosis. Aorta: The aortic root is normal in size and structure. Venous: The inferior vena cava is normal in size with greater than 50% respiratory variability, suggesting right atrial pressure of 3 mmHg. IAS/Shunts: No atrial level shunt detected by color flow Doppler.  LEFT VENTRICLE PLAX 2D LVIDd:         4.30 cm     Diastology LVIDs:         2.60 cm     LV e' medial:    6.15 cm/s LV PW:         1.10 cm     LV E/e' medial:  18.7 LV IVS:        1.20 cm     LV e' lateral:   8.03 cm/s LVOT diam:     1.70 cm     LV E/e' lateral: 14.3 LV SV:         40  LV SV Index:   22 LVOT Area:     2.27 cm  LV Volumes (MOD) LV vol d, MOD A2C: 71.7 ml LV vol d, MOD A4C: 80.0 ml LV vol s, MOD A2C: 39.1 ml LV vol s, MOD A4C: 35.6 ml LV SV MOD A2C:     32.6 ml LV SV MOD A4C:     80.0 ml LV SV MOD BP:      38.5 ml RIGHT VENTRICLE RV S prime:     8.55 cm/s TAPSE (M-mode): 1.3 cm LEFT ATRIUM             Index       RIGHT ATRIUM           Index LA diam:        4.10 cm 2.24 cm/m  RA Area:     10.70 cm LA Vol (A2C):    19.9 ml 10.89 ml/m RA Volume:   18.10 ml  9.91 ml/m LA Vol (A4C):   35.7 ml 19.54 ml/m LA Biplane Vol: 27.5 ml 15.05 ml/m  AORTIC VALVE LVOT Vmax:   65.50 cm/s LVOT Vmean:  47.400 cm/s LVOT VTI:    0.176 m  AORTA Ao Root diam: 2.70 cm MITRAL VALVE MV Area (PHT): 4.36 cm     SHUNTS MV Decel Time: 174 msec     Systemic VTI:  0.18 m MV E velocity: 115.00 cm/s  Systemic Diam: 1.70 cm MV A velocity: 121.00 cm/s MV E/A ratio:  0.95 Fransico Him MD Electronically signed by Fransico Him MD Signature Date/Time: 05/09/2021/1:08:10 PM    Final     Cardiac Studies    Echo from 05/09/21:    1. Left ventricular ejection fraction, by estimation, is 50 to 55%. The  left ventricle has low normal function. The left ventricle demonstrates  regional wall motion abnormalities (see scoring diagram/findings for  description). There is mild concentric  left ventricular hypertrophy. Left ventricular diastolic parameters are  indeterminate. Elevated left ventricular end-diastolic pressure. There is  akinesis of the left ventricular, apical septal wall, inferior wall and  inferolateral wall. There is  akinesis of the left ventricular, entire apical segment. There is  hypokinesis of the left ventricular, mid-apical anteroseptal wall.   2. Right ventricular systolic function is normal. The right ventricular  size is normal.   3. The mitral valve is normal in structure. Mild mitral valve  regurgitation. No evidence of mitral stenosis. Moderate mitral annular  calcification.   4. The aortic valve is tricuspid. Aortic valve regurgitation is not  visualized. Mild aortic valve sclerosis is present, with no evidence of  aortic valve stenosis.   5. The inferior vena cava is normal in size with greater than 50%  respiratory variability, suggesting right atrial pressure of 3 mmHg.    Cardiac Cath 03/2020  Ost LAD to Prox LAD lesion is 99% stenosed. LIMA to LAD known to be atretic. After orbital atherectomy, A  drug-eluting stent was successfully placed using a STENT RESOLUTE ONYX 3.5X15. Post intervention, there is a 0% residual stenosis. Mid LAD lesion is 100% stenosed. Despite atherectomy and angioplasty, flow could not be restored. Mid Cx lesion is 100% stenosed. SVG to OM patent. Prox RCA lesion is 100% stenosed. SVG to PDA occluded. There is moderate left ventricular systolic dysfunction. LV end diastolic pressure is moderately elevated. The left ventricular ejection fraction is 25-35% by visual estimate. There is no aortic valve stenosis. Balloon angioplasty was performed using a BALLOON SAPPHIRE 2.0X12.  Complex intervention due to severe three-vessel coronary artery disease with prior bypass surgery.  Successful atherectomy and stenting of the proximal LAD.  Unable to restore flow through the mid LAD.  I suspect, she is a somewhat late presenting infarct.  She already has evidence of heart failure with elevated LVEDP.  Will need ongoing medical therapy to help her LV dysfunction.   2D Echo 03/2020  1. Left ventricular ejection fraction, by estimation, is 35 to 40%. The  left ventricle has moderately decreased function. The left ventricle  demonstrates regional wall motion abnormalities (see scoring  diagram/findings for description). Left ventricular   diastolic parameters are consistent with Grade II diastolic dysfunction  (pseudonormalization). There is severe akinesis of the left ventricular,  mid-apical anteroseptal wall.   2. Right ventricular systolic function is mildly reduced. The right  ventricular size is normal.   3. The mitral valve is grossly normal. Mild mitral valve regurgitation.   4. The aortic valve is normal in structure. Aortic valve regurgitation is  not visualized. No aortic stenosis is present.     Patient Profile     Deanna [name] is a 80 y.o. female with a hx of CAD (MI with PTCA to LAD 1994, PTCA to [place], eventual CABG 1999 with grown graft closure  2/4), CKD IV, paroxysmal atrial fibrillation, PAD with L-R femfem crossover, also L subclavian artery stenosis with L common carotid to subclavian artery bypass, celiac artery stenosis, cognitive impairment, TBI, depression, anxiety, hearing loss, GERD, HTN, hypothyroidism, HLD, OSA (not using CPAP), incontinence, prior sepsis who is being seen 05/09/2021 for the evaluation of elevated troponin.   Assessment & Plan    Acute hypoxic respiratory failure  - occurred as a sequelae of 2 weeks of productive cough and recent URI - CXR without acute disease - VQ negative for PE, procal borderline elevated, on PO antibiotic, hypoxia improved,  managed per IM    CAD s/p prior MIs, PCIs, CABG, with elevated troponin - low level elevation from [phone] - continue ASA, Plavix, metoprolol, statin, IV heparin started by IM at admission /not indicated at this time  - Echo from 10/2 showed EF 50-55%, + WMA, elevated LVEDP, mild MR, mild aortic sclerosis  - clinically inconsistent with ACS and no further ischemic workup recommended   Chronic combined CHF - CXR no acute findings, POA  - Echo 10/2 EF 50-55%, + WMA, EF improved comparing to 2021, elevated LVEDP  - s/p IV Lasix 40mg  x1 on 05/09/21, clinically euvolemic today - on Lasix 40mg  daily at home, resumed today as renal index is overall stable with mild edema of BLE today     CKD stage IV - Cr 1.96 in 03/2021, admitted at 2.2->2.09, now 1.73,  baseline appears around 1.5-1.6 ranges over the past year, near baseline  - will resume home dose Lasix 40mg  daily, continue hold valsartan and farxiga given CKD III- IV   Paroxysmal atrial fibrillation occurred in 2021 - no evidence of recurrence so far  - not on anticoagulation historically due to brief duration of A fib  Mild memory loss  - noted on exam today, defer work up to IM      For questions or updates, please contact [place] Please consult [email or web address] for contact info under         Signed, Margie Billet, NP  05/11/2021, 9:13 AM

## 2021-05-11 NOTE — TOC Initial Note (Signed)
Transition of Care [facility]) - Initial/Assessment Note    Patient Details  Name: Deanna [name] MRN: [id number] Date of Birth: 01-08-1941  [facility] CM/SW Contact:    Bethena Roys, RN Phone Number: 05/11/2021, 4:33 PM  Clinical Narrative: Case Manager spoke with this patient regarding home health services-daughter in the room and will be the caregiver for this patient. Daughter states that the patient has used [facility] in the past for home health services and wants to use them again. Case Manager made the referral with Stac[facility]. Office will contact the patient's daughter within 24-48 hours for start of care. Patient is in need of oxygen- Referral sent to [facility]. Oxygen will be delivered to the room prior to transition home. Educated the daughter regarding oxygen use in the home. Case Manager provided the daughter with the Medicare.gov website in regards to personal care agency lists. Daughter feels that she may need to get an agency for assistance once she goes to work one day a week. Staff RN is aware of the plan of care.                 Expected Discharge Plan: [place] Barriers to Discharge: No Barriers Identified   Patient Goals and CMS Choice Patient states their goals for this hospitalization and ongoing recovery are:: to return home with home health services.   Choice offered to / list presented to : Patient, Adult Children (Patient has used [facility] in the past and wants to use again.)  Expected Discharge Plan and Services Expected Discharge Plan: [place] In-house Referral: NA Discharge Planning Services: CM Consult Post Acute Care Choice: [place] arrangements for the past 2 months: Apartment Expected Discharge Date: 05/11/21               DME Arranged: Oxygen DME Agency: [facility] Date DME Agency Contacted: 05/11/21 Time DME Agency Contacted: [phone] Representative spoke with at DME  Agency: [facility] HH Arranged: PT [place]: [place] Date [place]: 05/11/21 Time [place]: 23 Representative spoke with at [place]: [place]  Prior Living Arrangements/Services Living arrangements for the past 2 months: [place] with:: Self, Adult Children Patient language and need for interpreter reviewed:: Yes Do you feel safe going back to the place where you live?: Yes      Need for Family Participation in Patient Care: Yes (Comment) Care giver support system in place?: Yes (comment)   Criminal Activity/Legal Involvement Pertinent to Current Situation/Hospitalization: No - Comment as needed  Activities of Daily Living Home Assistive Devices/Equipment: Wheelchair, Environmental consultant (specify type) ADL Screening (condition at time of admission) Patient's cognitive ability adequate to safely complete daily activities?: Yes Is the patient deaf or have difficulty hearing?: Yes Does the patient have difficulty seeing, even when wearing glasses/contacts?: No Does the patient have difficulty concentrating, remembering, or making decisions?: No Patient able to express need for assistance with ADLs?: Yes Does the patient have difficulty dressing or bathing?: Yes Independently performs ADLs?: Yes (appropriate for developmental age) Does the patient have difficulty walking or climbing stairs?: Yes Weakness of Legs: Both Weakness of Arms/Hands: None  Permission Sought/Granted Permission sought to share information with : Family Supports, Customer service manager, Case Optician, dispensing granted to share information with : Yes, Verbal Permission Granted     Permission granted to share info w AGENCY: [facility], [facility]        Emotional Assessment Appearance:: Appears stated age Attitude/Demeanor/Rapport:  Unable to Assess Affect (typically observed): Unable to Assess Orientation: : Oriented to Self Alcohol / Substance Use: Not  Applicable Psych Involvement: No (comment)  Admission diagnosis:  NSTEMI (non-ST elevated myocardial infarction) ([place]) [I21.4] Acute respiratory failure with hypoxia ([place]) [J96.01] Acute hypoxemic respiratory failure ([place]) [J96.01] Patient Active Problem List   Diagnosis Date Noted   Acute hypoxemic respiratory failure ([place]) 05/09/2021   Acute bronchitis 05/09/2021   Hypokalemia 05/09/2021   Status post coronary artery stent placement    Hypoxia    Overactive bladder 12/16/2019   a 06/17/2018   Mood disorder ([place]) 06/17/2018   AKI (acute kidney injury) ([place]) 11/16/2017   Acute on chronic diastolic heart failure ([place]) 11/16/2017   Hypertensive urgency 11/14/2017   Thrombocytopenia ([place]) 04/26/2015   OSA on CPAP 04/26/2015   Chronic pain 04/26/2015   Physical deconditioning 04/26/2015   Hyperlipidemia 07/02/2014   Carotid artery stenosis 03/26/2012   Pleural effusion, left 12/19/2011   NSTEMI (non-ST elevated myocardial infarction) ([place]) 12/19/2011   Shortness of breath 12/19/2011    Class: Acute   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, 2011 11/28/2011   PERSONAL HX COLONIC POLYPS 06/18/2010   Hypothyroidism 11/17/2008   Obesity, Class III, BMI 40-49.9 (morbid obesity) ([place]) 11/17/2008   ANXIETY DEPRESSION 11/17/2008   Essential hypertension 11/17/2008   CAD in native artery 11/17/2008   HEMORRHOIDS 11/17/2008   GERD 11/17/2008   IBS 11/17/2008   PCP:  Lauree Chandler, NP Pharmacy:   [facility] [place], [place] - [address] AT [facility] [place] [address] Phone: [phone] Fax: [phone]  [facility] - [place], [place] [address] Phone: [phone] Fax: [phone]     Social Determinants of Health (SDOH) Interventions    Readmission Risk Interventions No flowsheet data found.

## 2021-05-11 NOTE — Evaluation (Signed)
Physical Therapy Evaluation Patient Details Name: Deanna [name] MRN: [id number] DOB: [birth year] Today's Date: 05/11/2021  History of Present Illness  Pt is a79 yr old female who presented due to SOB. Pt was admitted due to [facility] but not limited to: significant of CAD status post CABG and PCI, chronic combined CHF, paroxysmal A. 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 Pt with some underlying confusion which can determine the amount of assist provided by daughter but pt able to ambulate without AD to needing RW, and varying levels of assist with ADLs. Pt is currently limited in safe mobility by underlying confusion, and increased oxygen demand (see General Comments) in presence of generalized weakness. Pt requires min A for transfers and ambulation with RW. PT recommending HHPT at discharge to work on strength, balance and endurance. PT will continue to follow acutely.      Recommendations for follow up therapy are one component of a multi-disciplinary discharge planning process, led by the attending physician.  Recommendations may be updated based on patient status, additional functional criteria and insurance authorization.  Follow Up Recommendations Home health PT;Supervision/Assistance - 24 hour    Equipment Recommendations  None recommended by PT (has necessary equipment)       Precautions / Restrictions Precautions Precautions: Other (comment) (02 readings) Precaution Comments: Pt presents asymptomatic with changes in o2 readings Restrictions Weight Bearing Restrictions: No      Mobility  Bed Mobility Overal bed mobility: Needs  Assistance Bed Mobility: Supine to Sit     Supine to sit: Min guard;HOB elevated (pt cues on hoe to complete as trying to pull up using therapist)     General bed mobility comments: OOB in recliner on entry    Transfers Overall transfer level: Needs assistance Equipment used: Rolling walker (2 wheeled);1 person hand held assist (pt has increase in confidence with walker) Transfers: Sit to/from Stand Sit to Stand: Min guard;Min assist         General transfer comment: min guard for power up and steadying from recliner, requires minA for standing from low toilet  Ambulation/Gait Ambulation/Gait assistance: Min assist Gait Distance (Feet): 50 Feet (+50,) Assistive device: Rolling walker (2 wheeled) Gait Pattern/deviations: Step-through pattern;Decreased step length - right;Decreased step length - left;Trunk flexed Gait velocity: slowed Gait velocity interpretation: <1.31 ft/sec, indicative of household ambulator General Gait Details: contact guard for safety, vc for proximity to RW and upright posture as well as keeping RW on floor even to navigate around obstacles         Balance Overall balance assessment: Needs assistance Sitting-balance support: Feet supported Sitting balance-Leahy Scale: Good     Standing balance support: During functional activity;Bilateral upper extremity supported Standing balance-Leahy Scale: Fair Standing balance comment: pt can complete some unsupportive standing but pt is fearful of falling without touching onto an item                             Pertinent Vitals/Pain Pain Assessment: No/denies pain    Home Living Family/patient expects to be discharged to:: Private residence Living Arrangements: Children Available Help at Discharge: Family Type of Home: House Home Access: Level entry     Home  Layout: One level Home Equipment: Walker - 2 wheels;Cane - single point;Shower seat;Grab bars - tub/shower;Grab bars -  toilet;Wheelchair - manual      Prior Function Level of Independence: Needs assistance   Gait / Transfers Assistance Needed: depending on how they feel they will use cane or walker or no AE  ADL's / Homemaking Assistance Needed: Pt reports sometimes they will participate in [place] but depending on how they feel and sometimes ask for assistance with ADLs from daughter  Comments: daughter provides iADLs and driving        Extremity/Trunk Assessment   Upper Extremity Assessment Upper Extremity Assessment: Defer to OT evaluation    Lower Extremity Assessment Lower Extremity Assessment: Generalized weakness    Cervical / Trunk Assessment Cervical / Trunk Assessment: Kyphotic  Communication   Communication: No difficulties  Cognition Arousal/Alertness: Awake/alert Behavior During Therapy: WFL for tasks assessed/performed Overall Cognitive Status: Within Functional Limits for tasks assessed                                        General Comments General comments (skin integrity, edema, etc.): Daughter in room voices concerns over need for supplemental O2, Pt on RA on entry eating lunch and periodically SaO2 drops as low as 78%O2 while eating lunch, again with ambulation SaO2 on RA drops to 74%O2, rebounds with purse lip breathing and standing rest break however as soon as ambulation is commenced SaO2 drops again. Pt able to maintain SaO2 at 88%O2 with 2L O2 via [place].    Exercises     Assessment/Plan    PT Assessment Patient needs continued PT services  PT Problem List Decreased strength;Decreased activity tolerance;Cardiopulmonary status limiting activity       PT Treatment Interventions DME instruction;Gait training;Functional mobility training;Therapeutic activities;Therapeutic exercise;Balance training;Cognitive remediation;Patient/family education    PT Goals (Current goals can be found in the Care Plan section)  Acute Rehab PT Goals Patient Stated Goal: to  go home without o2 PT Goal Formulation: With patient/family Time For Goal Achievement: 05/25/21    Frequency Min 3X/week   Barriers to discharge        Co-evaluation               AM-PAC PT "6 Clicks" Mobility  Outcome Measure Help needed turning from your back to your side while in a flat bed without using bedrails?: None Help needed moving from lying on your back to sitting on the side of a flat bed without using bedrails?: A Little Help needed moving to and from a bed to a chair (including a wheelchair)?: A Little Help needed standing up from a chair using your arms (e.g., wheelchair or bedside chair)?: A Little Help needed to walk in hospital room?: A Little Help needed climbing 3-5 steps with a railing? : A Lot 6 Click Score: 18    End of Session Equipment Utilized During Treatment: Gait belt Activity Tolerance: Patient tolerated treatment well Patient left: in chair;with call bell/phone within reach;with family/visitor present Nurse Communication: Mobility status;Other (comment) (need for supplemental O2) PT Visit Diagnosis: Unsteadiness on feet (R26.81);Other abnormalities of gait and mobility (R26.89);Muscle weakness (generalized) (M62.81)    Time: 3009-2330 PT Time Calculation (min) (ACUTE ONLY): 28 min   Charges:   PT Evaluation $PT Eval Moderate Complexity: 1 Mod PT Treatments $Therapeutic Activity: 8-22 mins        Greogry Goodwyn B. [employer] PT,  DPT Acute Rehabilitation Services Pager [phone] Office [phone]   [place] 05/11/2021, 2:08 PM

## 2021-05-11 NOTE — Discharge Summary (Signed)
Physician Discharge Summary  Deanna [name] YSA:[id number] DOB: Jan 14, 1941 DOA: 05/09/2021  PCP: Lauree Chandler, NP  Admit date: 05/09/2021 Discharge date: 05/11/2021  Admitted From: hom Disposition:  home  Recommendations for Outpatient Follow-up:  Follow up with PCP in 1-2 weeks Please obtain BMP/CBC in one week Please follow up on the following pending results:  Home Health:yes  Equipment/Devices: home o2  Discharge Condition: Stable Code Status:   Code Status: Full Code Diet recommendation:  Diet Order             Diet Heart Room service appropriate? 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 In the ED was having coughing spells vitals with tachycardia oxygen saturation in mid 80s on room air placed on 2 L lab with creatinine 2.2 baseline 1.5, BNP 611 troponin initially 45 further repeat was high EKG no acute ischemia, D-dimer was positive COVID-19 negative chest x-ray no acute finding patient was admitted. Troponin 45> 137> 375> 273, cardio was consulted D-dimer positive at 1.66, VQ scan ordered.  Patient was managed by cardiology consult, IV heparin infusion.  Further evaluation by cardiology felt noncardiac and NSTEMI ruled out, VQ scan was negative no PE heparin was discontinued subsequently.  Felt to be bronchitis with recent URI visiting with acute hypoxic respiratory failure and will go home on Augmentin, home  oxygen with ambulation.  Seen by PT OT and still for discharge home with home health PT OT.  She will resume her diuretics and cardiac meds, she will follow-up with cardiology.   Discharge Diagnoses:  Acute hypoxic respiratory failure Respiratory distress Recent URI x2 weeks: Suspecting due to acute bronchitis,. No pe or acs. Doing well , weill need home o2 on ambulation. Checked procalcitonin-minimal at 0.51- add po augmentin to cont at home for few more days. Recent Labs  Lab 05/09/21 0042 05/10/21 0142 05/10/21 0459 05/11/21 0338  WBC 8.9 9.1  --  6.7  PROCALCITON  --   --  0.52  --    Elevated D-dimer 1.6 VQ neg.  Chronic combined CHF: BNP 611, chest x-ray no acute finding. Cardio feels not in acute chf. Ef normal on TTE.  Resume Lasix and home cardiac meds  Chest pain/tightness Elevated troponin 45> 137> 375> 273: Demand mismatch TTE EF 50-55%, Shows Regional Wall Motion Abnormalities, Acanthus of the Left Ventricle Apical Septal Wall, Inferior Wall and Inferolateral Wall and Other See Report.  Cardiology on board, suspecting demand ischemia, not anticipating ischemic work-up further. 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  Continue aspirin statin and also Plavix per cardiology.  PAF not on anticoagulation: Brief course of A. fib following cardiac cath last year no recurrence of A. fib did not feel need for anticoagulation per cardiology at that  time.  Continue metoprolol  AKI on CKD stage IIIb: Peaked to 2.2 now downtrending , now at baseline Recent Labs    12/07/20 1356 04/07/21 1457 05/09/21 0042 05/09/21 0759 05/10/21 0142 05/11/21 0338  BUN 22 38* 20 21 28* 34*  CREATININE 1.61* 1.96* 2.20* 2.09* 1.65* 1.73*     Hypothyroidism: Continue her  Synthroid  GERD: Continue PPI  OSA on CPAP -quit 3 years  ago  Poor historian? 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 : Will benefit with PCP follow-up, weight loss healthy lifestyle and outpatient sleep evaluation.  She will go home with hh pt ot with daughter  Consults: cardiology  Subjective: Aaox3, not in distress. Wants to go home.  Discharge Exam: Vitals:   05/11/21 0357 05/11/21 0945  BP: (!) 129/56 (!) 102/51  Pulse: 72 81  Resp: 16 18  Temp: 98.1 F (36.7 C) 98.2 F (36.8 C)  SpO2: 90% 99%   General: Pt is alert, awake, not in acute distress Cardiovascular: RRR, S1/S2 +, no rubs, no gallops Respiratory: CTA bilaterally, no wheezing, no rhonchi Abdominal: Soft, NT, ND, bowel sounds + Extremities: no edema, no cyanosis  Discharge Instructions  Discharge Instructions     (HEART FAILURE PATIENTS) Call MD:  Anytime you have any of the following symptoms: 1) 3 pound weight gain in 24 hours or 5 pounds in 1 week 2) shortness of breath, with or without a dry hacking cough 3) swelling in the hands, feet or stomach 4) if you have to sleep on extra pillows at night in order to breathe.   Complete by: As directed    Discharge instructions   Complete by: As directed    Follow up with cardiology in 1 week.  Please call call MD or return to ER for similar or worsening recurring problem that brought you to hospital or if any fever,nausea/vomiting,abdominal pain, uncontrolled pain, chest pain,  shortness of breath or any other alarming symptoms.  Please follow-up your doctor as instructed in a week time and call the office for appointment.  Please avoid alcohol, smoking, or any other illicit substance and maintain healthy habits including taking your regular medications as prescribed.  You were cared for by a hospitalist during your  hospital stay. If you have any questions about your discharge medications or the care you received while you were in the hospital after you are discharged, you can call the unit and ask to speak with the hospitalist on call if the hospitalist that took care of you is not available.  Once you are discharged, your primary care physician will handle any further medical issues. Please note that NO REFILLS for any discharge medications will be authorized once you are discharged, as it is imperative that you return to your primary care physician (or establish a relationship with a primary care physician if you do not have one) for your aftercare needs so that they can reassess your need for medications and monitor your lab values.   Increase activity slowly   Complete by: As directed       Allergies as of 05/11/2021       Reactions   Donepezil Other (See Comments)   Altered mood, aggression, and caused anger   Duloxetine Hcl    Increased confusion and memory concerns    Meloxicam    Pt suspects that this medicine causes fluid  on her lungs.    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 1 by mouth in the morning and 1 by mouth in the pm.   ALPRAZolam 0.5 MG tablet Commonly known as: Xanax Take 1 tablet (0.5 mg total) by mouth daily as needed for anxiety.   amoxicillin-clavulanate 500-125 MG tablet Commonly known as: AUGMENTIN Take 1 tablet (500 mg total) by mouth 2 (two) times daily with a meal for 3 days.   aspirin EC 81 MG tablet Take 81 mg by mouth at bedtime.   cholecalciferol 10 MCG (400 UNIT) Tabs  tablet Commonly known as: VITAMIN D3 Take 400 Units by mouth daily.   clopidogrel 75 MG tablet Commonly known as: PLAVIX TAKE 1 TABLET(75 MG) BY MOUTH DAILY What changed: See the new instructions.   divalproex 250 MG 24 hr tablet Commonly known as: DEPAKOTE ER Take 1 tablet (250 mg total) by mouth at bedtime.   dorzolamide 2 % ophthalmic solution Commonly known as: TRUSOPT Place 1 drop into both eyes 2 (two) times daily.   escitalopram 20 MG tablet Commonly known as: LEXAPRO Take 1 tablet (20 mg total) by mouth daily.   Farxiga 10 MG Tabs tablet Generic drug: dapagliflozin propanediol TAKE 1 TABLET(10 MG) BY MOUTH DAILY What changed: See the new instructions.   furosemide 40 MG tablet Commonly known as: LASIX TAKE 1 TABLET(40 MG) BY MOUTH DAILY What changed: See the new instructions.   gabapentin 100 MG capsule Commonly known as: NEURONTIN TAKE 2 CAPSULES(200 MG) BY MOUTH DAILY What changed: See the new instructions.   latanoprost 0.005 % ophthalmic solution Commonly known as: XALATAN Place 1 drop into both eyes at bedtime.   levothyroxine 175 MCG tablet Commonly known as: SYNTHROID Take 1 tablet (175 mcg total) by mouth daily. Take at the same time daily separate from other medications   meclizine 25 MG tablet Commonly known as: ANTIVERT Take 1 tablet (25 mg total) by mouth as needed for dizziness.   metoprolol succinate 25 MG 24 hr tablet Commonly known as: TOPROL-XL Take 1 tablet (25 mg total) by mouth daily. What changed:  how much to take when to take this   nitroGLYCERIN 0.4 MG SL tablet Commonly known as: NITROSTAT Place 1 tablet (0.4 mg total) under the tongue every 5 (five) minutes x 3 doses as needed for chest pain.   pantoprazole 40 MG tablet Commonly known as: PROTONIX TAKE 1 TABLET EVERY DAY   rosuvastatin 40 MG tablet Commonly known as: CRESTOR TAKE 1 TABLET(40 MG) BY MOUTH DAILY What changed: See the new instructions.   tiZANidine 2  MG tablet Commonly known as: ZANAFLEX Take 1 tablet (2 mg total) by mouth every 6 (six) hours as needed for muscle spasms.   valsartan 80 MG tablet Commonly known as: DIOVAN TAKE 1 TABLET EVERY MORNING What changed: when to take this   vitamin B-12 1000 MCG tablet Commonly known as: CYANOCOBALAMIN Take 1,000 mcg by mouth daily.               Durable Medical Equipment  (From admission, onward)  Start     Ordered   05/11/21 1343  For home use only DME oxygen  Once       Comments: Ambulatory 2l Oxygen  Question Answer Comment  Length of Need Lifetime   Mode or (Route) Nasal cannula   Liters per Minute 2   Oxygen delivery system Gas      05/11/21 1342            Follow-up Information     Roby Lofts M., PA-C Follow up on 06/02/2021.   Specialties: Physician Assistant, Cardiology Why: at [place] AM for cardiology follow up Contact information: [address] [place] [address] [phone]         Lauree Chandler, NP Follow up in 1 week(s).   Specialty: Geriatric Medicine Contact information: [address] [place] [address] [phone]         Lorretta Harp, MD .   Specialties: Cardiology, Radiology Contact information: [address] [place] [address] [phone]                Allergies  Allergen Reactions   Donepezil Other (See Comments)    Altered mood, aggression, and caused anger   Duloxetine Hcl     Increased confusion and memory concerns    Meloxicam     Pt suspects that this medicine causes fluid on her lungs.    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    Microbiology: Recent Results (from the past 240 hour(s))  Resp Panel by RT-PCR (Flu A&B, Covid) Nasopharyngeal Swab     Status: None   Collection Time: 05/09/21 12:49 AM   Specimen: Nasopharyngeal Swab; Nasopharyngeal(NP) swabs in vial transport medium  Result Value Ref Range Status   SARS Coronavirus 2 by RT PCR NEGATIVE NEGATIVE Final    Comment: (NOTE) SARS-CoV-2 target nucleic acids are NOT DETECTED.  The SARS-CoV-2 RNA is generally detectable in upper respiratory specimens during the acute phase of infection. The lowest concentration of SARS-CoV-2 viral copies this assay can detect is 138 copies/mL. A negative result does not preclude SARS-Cov-2 infection and should not be used as the sole basis for treatment or other patient management decisions. A negative result may occur with  improper specimen collection/handling, submission of specimen other than nasopharyngeal swab, presence of viral mutation(s) within the areas targeted by this assay, and inadequate number of viral copies(<138 copies/mL). A negative result must be combined with clinical observations, patient history, and epidemiological information. The expected result is Negative.  Fact Sheet for Patients:  EntrepreneurPulse.com.au  Fact Sheet for Healthcare Providers:  IncredibleEmployment.be  This test is no t yet approved or cleared by the [place] FDA and  has been authorized for detection and/or diagnosis of SARS-CoV-2 by FDA under an Emergency Use Authorization (EUA). This EUA will remain  in effect (meaning this test can be used) for the duration of the COVID-19 declaration under Section 564(b)(1) of the Act, 21 U.S.C.section 360bbb-3(b)(1), unless the authorization is terminated  or revoked sooner.       Influenza A by PCR NEGATIVE NEGATIVE Final   Influenza B by PCR NEGATIVE  NEGATIVE Final     Comment: (NOTE) The Xpert Xpress SARS-CoV-2/FLU/RSV plus assay is intended as an aid in the diagnosis of influenza from Nasopharyngeal swab specimens and should not be used as a sole basis for treatment. Nasal washings and aspirates are unacceptable for Xpert Xpress SARS-CoV-2/FLU/RSV testing.  Fact Sheet for Patients: EntrepreneurPulse.com.au  Fact Sheet for Healthcare Providers: IncredibleEmployment.be  This test is not yet approved or cleared by the [place] FDA and has been authorized for detection and/or diagnosis of SARS-CoV-2 by FDA under an Emergency Use Authorization (EUA). This EUA will remain in effect (meaning this test can be used) for the duration of the COVID-19 declaration under Section 564(b)(1) of the Act, 21 U.S.C. section 360bbb-3(b)(1), unless the authorization is terminated or revoked.  Performed at [facility], [place] [address] [place], [place] [address]     Procedures/Studies: NM Pulmonary Perfusion  Result Date: 05/10/2021 CLINICAL DATA:  Chest pain and shortness of breath since admission, history of smoking, coronary artery disease post bypass surgery EXAM: NUCLEAR MEDICINE PERFUSION LUNG SCAN TECHNIQUE: Perfusion images were obtained in multiple projections after intravenous injection of radiopharmaceutical. Ventilation scans intentionally deferred if perfusion scan and chest x-ray adequate for interpretation during COVID 19 epidemic. RADIOPHARMACEUTICALS:  3.8 mCi Tc-67m MAA IV COMPARISON:  Chest radiograph 05/10/2021 FINDINGS: Normal perfusion lung scan. No perfusion defects identified. IMPRESSION: Normal perfusion lung scan; pulmonary embolism absent. Electronically Signed   By: Lavonia Dana M.D.   On: 05/10/2021 16:51   DG Chest Port 1 View  Result Date: 05/10/2021 CLINICAL DATA:  80 year old female with history of shortness of breath and respiratory distress. EXAM: PORTABLE CHEST 1 VIEW COMPARISON:  Chest  x-ray 05/09/2021. FINDINGS: Lung volumes are low. No consolidative airspace disease. No pleural effusions. No pneumothorax. No pulmonary nodule or mass noted. Pulmonary vasculature and the cardiomediastinal silhouette are within normal limits. Atherosclerotic calcifications in the thoracic aorta. Status post median sternotomy for CABG including LIMA to the LAD. Multiple surgical clips are noted in the lower left cervical region. Old healed right-sided rib fractures are again noted. Ossification of the coracoclavicular ligament and elevation of the distal right clavicle relative to the acromion, with old healed distal right clavicular fracture, related to remote trauma. IMPRESSION: 1. No radiographic evidence of acute cardiopulmonary disease. 2. Aortic atherosclerosis. Electronically Signed   By: Vinnie Langton M.D.   On: 05/10/2021 08:35   DG Chest Port 1 View  Result Date: 05/09/2021 CLINICAL DATA:  Shortness of breath EXAM: PORTABLE CHEST 1 VIEW COMPARISON:  03/25/2020 FINDINGS: Cardiac shadow is within normal limits. Postsurgical changes are noted. Lungs are well aerated bilaterally. Previously seen airspace opacities have resolved in the interval. No new focal abnormality is seen. IMPRESSION: No acute abnormality noted. Electronically Signed   By: Inez Catalina M.D.   On: 05/09/2021 00:58   ECHOCARDIOGRAM COMPLETE  Result Date: 05/09/2021    ECHOCARDIOGRAM REPORT   Patient Name:   KATURAH KARAPETIAN Date of Exam: 05/09/2021 Medical Rec #:  [id number]       Height:       61.0 in Accession #:    [id number]      Weight:       185.0 lb Date of Birth:  1941/04/10       BSA:          1.827 m Patient Age:    40 years        BP:  118/60 mmHg Patient Gender: F               HR:           82 bpm. Exam Location:  Inpatient Procedure: 2D Echo, Cardiac Doppler, Color Doppler and Intracardiac            Opacification Agent Indications:    NSTEMI I21.4                 CHF-Acute Systolic H20.94  History:         Patient has prior history of Echocardiogram examinations, most                 recent 03/19/2020. CHF, CAD and Previous Myocardial Infarction,                 PAD; Risk Factors:Sleep Apnea. Thyroid disease, GERD.  Sonographer:    Darlina Sicilian RDCS Referring Phys: [id number] [place]  1. Left ventricular ejection fraction, by estimation, is 50 to 55%. The left ventricle has low normal function. The left ventricle demonstrates regional wall motion abnormalities (see scoring diagram/findings for description). There is mild concentric left ventricular hypertrophy. Left ventricular diastolic parameters are indeterminate. Elevated left ventricular end-diastolic pressure. There is akinesis of the left ventricular, apical septal wall, inferior wall and inferolateral wall. There is akinesis of the left ventricular, entire apical segment. There is hypokinesis of the left ventricular, mid-apical anteroseptal wall.  2. Right ventricular systolic function is normal. The right ventricular size is normal.  3. The mitral valve is normal in structure. Mild mitral valve regurgitation. No evidence of mitral stenosis. Moderate mitral annular calcification.  4. The aortic valve is tricuspid. Aortic valve regurgitation is not visualized. Mild aortic valve sclerosis is present, with no evidence of aortic valve stenosis.  5. The inferior vena cava is normal in size with greater than 50% respiratory variability, suggesting right atrial pressure of 3 mmHg. FINDINGS  Left Ventricle: Left ventricular ejection fraction, by estimation, is 50 to 55%. The left ventricle has low normal function. The left ventricle demonstrates regional wall motion abnormalities. The left ventricular internal cavity size was normal in size. There is mild concentric left ventricular hypertrophy. Left ventricular diastolic parameters are indeterminate. Elevated left ventricular end-diastolic pressure. Right Ventricle: The right ventricular size is  normal. No increase in right ventricular wall thickness. Right ventricular systolic function is normal. Left Atrium: Left atrial size was normal in size. Right Atrium: Right atrial size was normal in size. Pericardium: There is no evidence of pericardial effusion. Mitral Valve: The mitral valve is normal in structure. Moderate mitral annular calcification. Mild mitral valve regurgitation. No evidence of mitral valve stenosis. Tricuspid Valve: The tricuspid valve is normal in structure. Tricuspid valve regurgitation is mild . No evidence of tricuspid stenosis. Aortic Valve: The aortic valve is tricuspid. Aortic valve regurgitation is not visualized. Mild aortic valve sclerosis is present, with no evidence of aortic valve stenosis. Pulmonic Valve: The pulmonic valve was normal in structure. Pulmonic valve regurgitation is trivial. No evidence of pulmonic stenosis. Aorta: The aortic root is normal in size and structure. Venous: The inferior vena cava is normal in size with greater than 50% respiratory variability, suggesting right atrial pressure of 3 mmHg. IAS/Shunts: No atrial level shunt detected by color flow Doppler.  LEFT VENTRICLE PLAX 2D LVIDd:         4.30 cm     Diastology LVIDs:         2.60 cm  LV e' medial:    6.15 cm/s LV PW:         1.10 cm     LV E/e' medial:  18.7 LV IVS:        1.20 cm     LV e' lateral:   8.03 cm/s LVOT diam:     1.70 cm     LV E/e' lateral: 14.3 LV SV:         40 LV SV Index:   22 LVOT Area:     2.27 cm  LV Volumes (MOD) LV vol d, MOD A2C: 71.7 ml LV vol d, MOD A4C: 80.0 ml LV vol s, MOD A2C: 39.1 ml LV vol s, MOD A4C: 35.6 ml LV SV MOD A2C:     32.6 ml LV SV MOD A4C:     80.0 ml LV SV MOD BP:      38.5 ml RIGHT VENTRICLE RV S prime:     8.55 cm/s TAPSE (M-mode): 1.3 cm LEFT ATRIUM             Index       RIGHT ATRIUM           Index LA diam:        4.10 cm 2.24 cm/m  RA Area:     10.70 cm LA Vol (A2C):   19.9 ml 10.89 ml/m RA Volume:   18.10 ml  9.91 ml/m LA Vol (A4C):    35.7 ml 19.54 ml/m LA Biplane Vol: 27.5 ml 15.05 ml/m  AORTIC VALVE LVOT Vmax:   65.50 cm/s LVOT Vmean:  47.400 cm/s LVOT VTI:    0.176 m  AORTA Ao Root diam: 2.70 cm MITRAL VALVE MV Area (PHT): 4.36 cm     SHUNTS MV Decel Time: 174 msec     Systemic VTI:  0.18 m MV E velocity: 115.00 cm/s  Systemic Diam: 1.70 cm MV A velocity: 121.00 cm/s MV E/A ratio:  0.95 Fransico Him MD Electronically signed by Fransico Him MD Signature Date/Time: 05/09/2021/1:08:10 PM    Final     Labs: BNP (last 3 results) Recent Labs    05/09/21 0042  BNP 696.2*   Basic Metabolic Panel: Recent Labs  Lab 05/09/21 0042 05/09/21 0759 05/10/21 0142 05/11/21 0338  NA 138 139 136 137  K 3.4* 4.3 4.4 4.4  CL 105 104 103 102  CO2 22 21* 23 26  GLUCOSE 170* 215* 114* 98  BUN 20 21 28* 34*  CREATININE 2.20* 2.09* 1.65* 1.73*  CALCIUM 8.9 9.2 9.0 9.1  MG  --  1.8  --   --    Liver Function Tests: Recent Labs  Lab 05/09/21 0042  AST 16  ALT 11  ALKPHOS 66  BILITOT 1.1  PROT 6.2*  ALBUMIN 3.5   No results for input(s): LIPASE, AMYLASE in the last 168 hours. No results for input(s): AMMONIA in the last 168 hours. CBC: Recent Labs  Lab 05/09/21 0042 05/10/21 0142 05/11/21 0338  WBC 8.9 9.1 6.7  NEUTROABS 6.8  --   --   HGB 12.3 11.9* 11.6*  HCT 39.6 36.7 36.2  MCV 88.6 85.7 86.0  PLT 151 167 165   Cardiac Enzymes: No results for input(s): CKTOTAL, CKMB, CKMBINDEX, TROPONINI in the last 168 hours. BNP: Invalid input(s): POCBNP CBG: No results for input(s): GLUCAP in the last 168 hours. D-Dimer Recent Labs    05/09/21 0500  DDIMER 1.66*   Hgb A1c No results for input(s): HGBA1C in the last  72 hours. Lipid Profile Recent Labs    05/10/21 0142  CHOL 110  HDL 34*  LDLCALC 44  TRIG 160*  CHOLHDL 3.2   Thyroid function studies No results for input(s): TSH, T4TOTAL, T3FREE, THYROIDAB in the last 72 hours.  Invalid input(s): FREET3 Anemia work up No results for input(s): VITAMINB12,  FOLATE, FERRITIN, TIBC, IRON, RETICCTPCT in the last 72 hours. Urinalysis    Component Value Date/Time   COLORURINE YELLOW 04/26/2015 0811   APPEARANCEUR CLEAR 04/26/2015 0811   LABSPEC <1.005 (L) 04/26/2015 0811   PHURINE 6.0 04/26/2015 0811   GLUCOSEU NEGATIVE 04/26/2015 0811   HGBUR SMALL (A) 04/26/2015 0811   BILIRUBINUR NEGATIVE 04/26/2015 0811   KETONESUR NEGATIVE 04/26/2015 0811   PROTEINUR NEGATIVE 04/26/2015 0811   UROBILINOGEN 0.2 04/26/2015 0811   NITRITE NEGATIVE 04/26/2015 0811   LEUKOCYTESUR MODERATE (A) 04/26/2015 0811   Sepsis Labs Invalid input(s): PROCALCITONIN,  WBC,  LACTICIDVEN Microbiology Recent Results (from the past 240 hour(s))  Resp Panel by RT-PCR (Flu A&B, Covid) Nasopharyngeal Swab     Status: None   Collection Time: 05/09/21 12:49 AM   Specimen: Nasopharyngeal Swab; Nasopharyngeal(NP) swabs in vial transport medium  Result Value Ref Range Status   SARS Coronavirus 2 by RT PCR NEGATIVE NEGATIVE Final    Comment: (NOTE) SARS-CoV-2 target nucleic acids are NOT DETECTED.  The SARS-CoV-2 RNA is generally detectable in upper respiratory specimens during the acute phase of infection. The lowest concentration of SARS-CoV-2 viral copies this assay can detect is 138 copies/mL. A negative result does not preclude SARS-Cov-2 infection and should not be used as the sole basis for treatment or other patient management decisions. A negative result may occur with  improper specimen collection/handling, submission of specimen other than nasopharyngeal swab, presence of viral mutation(s) within the areas targeted by this assay, and inadequate number of viral copies(<138 copies/mL). A negative result must be combined with clinical observations, patient history, and epidemiological information. The expected result is Negative.  Fact Sheet for Patients:  EntrepreneurPulse.com.au  Fact Sheet for Healthcare Providers:   IncredibleEmployment.be  This test is no t yet approved or cleared by the [place] FDA and  has been authorized for detection and/or diagnosis of SARS-CoV-2 by FDA under an Emergency Use Authorization (EUA). This EUA will remain  in effect (meaning this test can be used) for the duration of the COVID-19 declaration under Section 564(b)(1) of the Act, 21 U.S.C.section 360bbb-3(b)(1), unless the authorization is terminated  or revoked sooner.       Influenza A by PCR NEGATIVE NEGATIVE Final   Influenza B by PCR NEGATIVE NEGATIVE Final    Comment: (NOTE) The Xpert Xpress SARS-CoV-2/FLU/RSV plus assay is intended as an aid in the diagnosis of influenza from Nasopharyngeal swab specimens and should not be used as a sole basis for treatment. Nasal washings and aspirates are unacceptable for Xpert Xpress SARS-CoV-2/FLU/RSV testing.  Fact Sheet for Patients: EntrepreneurPulse.com.au  Fact Sheet for Healthcare Providers: IncredibleEmployment.be  This test is not yet approved or cleared by the [place] FDA and has been authorized for detection and/or diagnosis of SARS-CoV-2 by FDA under an Emergency Use Authorization (EUA). This EUA will remain in effect (meaning this test can be used) for the duration of the COVID-19 declaration under Section 564(b)(1) of the Act, 21 U.S.C. section 360bbb-3(b)(1), unless the authorization is terminated or revoked.  Performed at [facility], [place] [address] [place], [place] [address]      Time coordinating discharge:  25 minutes  SIGNED: Antonieta Pert, MD  [facility] 05/11/2021, 2:04 PM  If 7PM-7AM, please contact night-coverage [email or web address]

## 2021-05-11 NOTE — Plan of Care (Signed)
  Problem: Nutrition: Goal: Adequate nutrition will be maintained Outcome: Progressing   Problem: Coping: Goal: Level of anxiety will decrease Outcome: Progressing   

## 2021-05-11 NOTE — Evaluation (Signed)
Occupational Therapy Evaluation Patient Details Name: Deanna [name] MRN: [id number] DOB: Jun 25, 1941 Today's Date: 05/11/2021   History of Present Illness Pt is a61 yr old female who presented due to SOB. Pt was admitted due to [facility] but not limited to: significant of CAD status post CABG and PCI, chronic combined CHF, paroxysmal A. 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 Pt at this time required min guard with HOB elevated and bed rail for supine to sitting. Pt was able to complete transfers with min guard as cued on hand placement. Pt reports they use DME based on how they feel at home from non to walker. Pt completed hand held assist ambulation but reported feeling unsteady then when presented with RW had increase in posture in standing. Pt in session was asymptomatic and no reports of SOB. Pt while on room air with bed mobility ranged from 91-88%, transfers 88-90% and then ambulation post 48 feet x two trial 86-88% but was able to recover into 90s% on room air. Pt currently with functional limitations due to the deficits listed below (see OT Problem List).  Pt will benefit from skilled OT to increase their safety and independence with ADL and functional mobility for ADL to facilitate discharge to venue listed below.        Recommendations for follow up therapy are one component of a multi-disciplinary discharge planning process, led by the attending physician.  Recommendations may be updated based on patient status, additional functional criteria and insurance authorization.   Follow Up Recommendations   (cardiac OP therapies as long as daughter is around to assist as  needed)    Equipment Recommendations  None recommended by OT    Recommendations for Other Services       Precautions / Restrictions Precautions Precautions: Other (comment) (02 readings) Precaution Comments: Pt presents asymptomatic with changes in o2 readings Restrictions Weight Bearing Restrictions: No      Mobility Bed Mobility Overal bed mobility: Needs Assistance Bed Mobility: Supine to Sit     Supine to sit: Min guard;HOB elevated (pt cues on hoe to complete as trying to pull up using therapist)          Transfers Overall transfer level: Needs assistance Equipment used: Rolling walker (2 wheeled);1 person hand held assist (pt has increase in confidence with walker) Transfers: Sit to/from Stand Sit to Stand: Min guard         General transfer comment: pt required decrease in level of assistance with increase in particiaption    Balance Overall balance assessment: Needs assistance Sitting-balance support: Feet supported Sitting balance-Leahy Scale: Good     Standing balance support: During functional activity;Bilateral upper extremity supported Standing balance-Leahy Scale: Fair Standing balance comment: pt can complete some unsupportive standing but pt is fearful of falling without touching onto an item                           ADL either performed or assessed with clinical judgement   ADL Overall ADL's : Needs assistance/impaired Eating/Feeding: Independent;Sitting   Grooming: Wash/dry hands;Wash/dry face;Min guard;Cueing for safety;Cueing for sequencing;Sitting;Standing   Upper Body Bathing: Set up;Cueing for safety;Cueing for sequencing;Sitting   Lower Body Bathing: Cueing  for sequencing;Cueing for safety;Sit to/from stand;Min guard   Upper Body Dressing : Set up;Cueing for compensatory techniques;Cueing for sequencing;Cueing for safety;Sitting   Lower Body Dressing: Cueing for safety;Cueing for sequencing;Sit to/from stand;Min  guard   Toilet Transfer: Min guard;Cueing for safety;Cueing for sequencing;Ambulation;RW   Toileting- Clothing Manipulation and Hygiene: Supervision/safety;Cueing for sequencing;Cueing for safety;Sit to/from stand   Tub/ Shower Transfer: Min guard;Cueing for safety;Cueing for sequencing;Ambulation;Shower seat;Grab bars;Rolling walker   Functional mobility during ADLs: Min guard;Rolling walker       Vision Baseline Vision/History: 1 Wears glasses       Perception     Praxis      Pertinent Vitals/Pain Pain Assessment: No/denies pain     Hand Dominance     Extremity/Trunk Assessment Upper Extremity Assessment Upper Extremity Assessment: Overall WFL for tasks assessed (pt does present with tremor which sometimes impeeds on FM)   Lower Extremity Assessment Lower Extremity Assessment: Defer to PT evaluation   Cervical / Trunk Assessment Cervical / Trunk Assessment: Kyphotic   Communication Communication Communication: No difficulties   Cognition Arousal/Alertness: Awake/alert Behavior During Therapy: WFL for tasks assessed/performed Overall Cognitive Status: Within Functional Limits for tasks assessed                                     General Comments       Exercises     Shoulder Instructions      Home Living Family/patient expects to be discharged to:: Private residence Living Arrangements: Children Available Help at Discharge: Family Type of Home: House Home Access: Level entry     Home Layout: One level     Bathroom Shower/Tub: Occupational psychologist: Handicapped height Bathroom Accessibility: Yes How Accessible: Accessible via walker Home Equipment: [place] - 2 wheels;Cane - single point;Shower seat;Grab bars - tub/shower;Grab bars - toilet;Wheelchair - manual          Prior Functioning/Environment Level of Independence: Needs assistance  Gait / Transfers Assistance Needed: depending on how they feel they will use cane  or walker or no AE ADL's / Homemaking Assistance Needed: Pt reports sometimes they will participate in [place] but depending on how they feel and sometimes ask for assistance with ADLs from daughter            OT Problem List: Decreased range of motion;Decreased activity tolerance;Impaired balance (sitting and/or standing);Decreased safety awareness;Decreased knowledge of use of DME or AE;Cardiopulmonary status limiting activity      OT Treatment/Interventions: Self-care/ADL training;Therapeutic exercise;Energy conservation;DME and/or AE instruction;Therapeutic activities;Patient/family education;Balance training    OT Goals(Current goals can be found in the care plan section) Acute Rehab OT Goals Patient Stated Goal: to go home without o2 OT Goal Formulation: With patient Time For Goal Achievement: 05/29/21 Potential to Achieve Goals: Good ADL Goals Pt Will Perform Lower Body Bathing: with modified independence;sit to/from stand Pt Will Transfer to Toilet: with modified independence;ambulating Pt Will Perform Tub/Shower Transfer: with modified independence;shower seat;grab bars;rolling walker;ambulating Additional ADL Goal #1: Pt will tolerate 10 mins of standing ADLS with stable o2 readings on room air  OT Frequency: Min 2X/week   Barriers to D/C:            Co-evaluation              AM-PAC OT "6 Clicks" Daily Activity     Outcome Measure Help from another person eating meals?: None Help from another person  taking care of personal grooming?: A Little Help from another person toileting, which includes using toliet, bedpan, or urinal?: A Little Help from another person bathing (including washing, rinsing, drying)?: A Little Help from another person to put on and taking off regular upper body clothing?: A Little Help from another person to put on and taking off regular lower body clothing?: A Little 6 Click Score: 19   End of Session Equipment Utilized During Treatment:  Gait belt;Rolling walker Nurse Communication: Mobility status;Other (comment) (o2)  Activity Tolerance: Patient tolerated treatment well Patient left: in chair;with call bell/phone within reach  OT Visit Diagnosis: Unsteadiness on feet (R26.81);Other abnormalities of gait and mobility (R26.89);Muscle weakness (generalized) (M62.81)                Time: 1129-1208 OT Time Calculation (min): 39 min Charges:  OT General Charges $OT Visit: 1 Visit OT Evaluation $OT Eval Low Complexity: 1 Low OT Treatments $Self Care/Home Management : 23-37 mins  Joeseph Amor OTR/L  Acute Rehab Services  [phone] office number [phone] pager number   Joeseph Amor 05/11/2021, 12:33 PM

## 2021-05-11 NOTE — Progress Notes (Signed)
SATURATION QUALIFICATIONS: (This note is used to comply with regulatory documentation for home oxygen)  Patient Saturations on Room Air at Rest = 99%  Patient Saturations on Room Air while Ambulating = 76%  Patient Saturations on 2 Liters of oxygen while Ambulating = 88%  Please briefly explain why patient needs home oxygen: Pt has increased variability in oxygen saturation with mobility/activity and requires 2L O2 to maintain SaO2 88%O2.   Deanna [name] PT, DPT Acute Rehabilitation Services Pager [phone] Office [phone]

## 2021-05-12 ENCOUNTER — Other Ambulatory Visit: Payer: Self-pay

## 2021-05-12 ENCOUNTER — Other Ambulatory Visit: Payer: Self-pay | Admitting: Nurse Practitioner

## 2021-05-12 DIAGNOSIS — A4151 Sepsis due to Escherichia coli [E. coli]: Secondary | ICD-10-CM | POA: Diagnosis not present

## 2021-05-12 DIAGNOSIS — I10 Essential (primary) hypertension: Secondary | ICD-10-CM

## 2021-05-12 DIAGNOSIS — J9601 Acute respiratory failure with hypoxia: Secondary | ICD-10-CM | POA: Diagnosis not present

## 2021-05-12 DIAGNOSIS — J9691 Respiratory failure, unspecified with hypoxia: Secondary | ICD-10-CM | POA: Diagnosis not present

## 2021-05-12 DIAGNOSIS — G4733 Obstructive sleep apnea (adult) (pediatric): Secondary | ICD-10-CM | POA: Diagnosis not present

## 2021-05-12 DIAGNOSIS — R269 Unspecified abnormalities of gait and mobility: Secondary | ICD-10-CM | POA: Diagnosis not present

## 2021-05-12 DIAGNOSIS — I214 Non-ST elevation (NSTEMI) myocardial infarction: Secondary | ICD-10-CM

## 2021-05-12 NOTE — Patient Outreach (Signed)
[facility]) Care Management  [place]  05/12/2021   Deanna [name] [birth year] [id number]  Subjective: Telephone call to patient for follow up from recent hospitalization.  Spoke with daughter Deanna [name] who is the caregiver.  She states patient had URI and is on antibiotics.  Discussed signs of of worsening. She verbalized understanding but patient also focusing on heart failure.  Daughter is able to describe regimen and how to follow up. Discussed importance of heart failure and regimen.    Patient with long history of heart problems including heart failure and CABG per daughter.   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 Deanna [name] lives with patient and is her primary caregiver.  She helps patient with all aspects of care including transportation and medications.  Daughter to call [facility] care for follow up and cardiology appointment is already scheduled for later this month.    Discussed [facility] services and support. Daughter is agreeable to care and support.    Objective:   Encounter Medications:  Outpatient Encounter Medications as of 05/12/2021  Medication Sig   acetaminophen (TYLENOL) 325 MG tablet Take 325 mg by mouth in the morning and at bedtime. 1 by mouth in the morning and 1 by mouth in the pm.   ALPRAZolam (XANAX) 0.5 MG tablet Take 1 tablet (0.5 mg total) by mouth daily as needed for anxiety.   amoxicillin-clavulanate (AUGMENTIN) 500-125 MG tablet Take 1 tablet (500 mg total) by mouth 2 (two) times daily with a meal for 3 days.   aspirin EC 81 MG tablet Take 81 mg by mouth at bedtime.    cholecalciferol (VITAMIN D) 400 units TABS tablet Take 400 Units by mouth daily.   clopidogrel (PLAVIX) 75 MG tablet TAKE 1 TABLET(75 MG) BY MOUTH DAILY (Patient taking differently: Take 75 mg by mouth daily.)   divalproex (DEPAKOTE ER) 250 MG 24 hr tablet Take 1 tablet (250  mg total) by mouth at bedtime.   dorzolamide (TRUSOPT) 2 % ophthalmic solution Place 1 drop into both eyes 2 (two) times daily.   escitalopram (LEXAPRO) 20 MG tablet Take 1 tablet (20 mg total) by mouth daily.   FARXIGA 10 MG TABS tablet TAKE 1 TABLET(10 MG) BY MOUTH DAILY (Patient taking differently: Take 10 mg by mouth daily.)   furosemide (LASIX) 40 MG tablet TAKE 1 TABLET(40 MG) BY MOUTH DAILY (Patient taking differently: Take 40 mg by mouth daily.)   gabapentin (NEURONTIN) 100 MG capsule TAKE 2 CAPSULES(200 MG) BY MOUTH DAILY (Patient taking differently: Take 100 mg by mouth 2 (two) times daily.)   latanoprost (XALATAN) 0.005 % ophthalmic solution Place 1 drop into both eyes at bedtime.   levothyroxine (SYNTHROID) 175 MCG tablet Take 1 tablet (175 mcg total) by mouth daily. Take at the same time daily separate from other medications   meclizine (ANTIVERT) 25 MG tablet Take 1 tablet (25 mg total) by mouth as needed for dizziness.   metoprolol succinate (TOPROL-XL) 25 MG 24 hr tablet Take 1 tablet (25 mg total) by mouth daily. (Patient taking differently: Take 12.5 mg by mouth in the morning and at bedtime.)   nitroGLYCERIN (NITROSTAT) 0.4 MG SL tablet Place 1 tablet (0.4 mg total) under the tongue every 5 (five) minutes x 3 doses as needed for chest pain.   pantoprazole (PROTONIX) 40 MG tablet TAKE 1 TABLET EVERY DAY (Patient taking differently: Take 40 mg by mouth  daily.)   rosuvastatin (CRESTOR) 40 MG tablet TAKE 1 TABLET(40 MG) BY MOUTH DAILY (Patient taking differently: Take 40 mg by mouth at bedtime.)   tiZANidine (ZANAFLEX) 2 MG tablet Take 1 tablet (2 mg total) by mouth every 6 (six) hours as needed for muscle spasms.   valsartan (DIOVAN) 80 MG tablet TAKE 1 TABLET EVERY MORNING (Patient taking differently: Take 80 mg by mouth daily.)   vitamin B-12 (CYANOCOBALAMIN) 1000 MCG tablet Take 1,000 mcg by mouth daily.   No facility-administered encounter medications on file as of 05/12/2021.     Functional Status:  In your present state of health, do you have any difficulty performing the following activities: 05/12/2021 05/09/2021  Hearing? Y Y  Comment hearing loss -  Vision? Y N  Comment glaucoma -  Difficulty concentrating or making decisions? N N  Walking or climbing stairs? Tempie Donning  Comment assistance of 1 person -  Dressing or bathing? N Y  Comment daughter does -  Doing errands, shopping? N Y  Comment daughter does daughter does  Conservation officer, nature and eating ? Y -  Comment daughter does -  Using the Toilet? Y -  Comment daughter helps -  In the past six months, have you accidently leaked urine? Y -  Comment incontinent -  Do you have problems with loss of bowel control? N -  Managing your Medications? Y -  Comment daughter does -  Managing your Finances? Y -  Comment daughter does -  Runner, broadcasting/film/video? Y -  Comment duaghter does -  Some recent data might be hidden    Fall/Depression Screening: Fall Risk  05/12/2021 05/12/2021 04/15/2021  Falls in the past year? 1 1 0  Number falls in past yr: 1 0 0  Injury with Fall? 0 0 0  Risk for fall due to : - History of fall(s) No Fall Risks  Follow up - Education provided Falls evaluation completed   PHQ 2/9 Scores 05/12/2021 04/15/2021 09/02/2020 05/06/2020 04/08/2019 11/29/2017 11/17/2015  PHQ - 2 Score - 0 - 0 - 6 1  PHQ- 9 Score - - - - - 18 -  Exception Documentation Other- indicate reason in comment box - Other- indicate reason in comment box - Other- indicate reason in comment box - -  Not completed Daughter finishes assessment - Seeing psych - Already under the care [place] - -    Assessment:   Care Plan Care Plan : General Plan of Care (Adult)  Updates made by Jon Billings, RN since 05/12/2021 12:00 AM     Problem: Therapeutic Alliance (General Plan of Care)      Goal: Therapeutic Alliance Established as evidenced by engagement with care manager   Start Date: 05/12/2021  Expected End  Date: 07/07/2021  Priority: High  Note:   Evidence-based guidance:  Avoid value judgments; convey acceptance.  Encourage collaboration with the treatment team.  Establish rapport; develop trust relationship.  Therapist, art.  Provide emotional support; encourage patient to share feelings of anger, fear and anxiety.  Promote self-reliance and autonomy based on age and ability; discourage overprotection.  Use empathy and nonjudgmental, participatory manner.   Notes:     Task: Develop Relationship to Effect Behavior Change   Due Date: 07/07/2021  Priority: Routine  Responsible User: Jon Billings, RN  Note:   Care Management Activities:    - acceptance conveyed - care explained - collaboration with team encouraged - emotional support provided - questions encouraged - reassurance provided  Notes: 05/12/21 Daughter is the only caregiver for patient. No other family.  Deanna [name] not formally employed.  However, she will possibly want to hire help if she sees they can afford it as patient does not qualify for medicaid. She will explore resources if able.  She is in agreement to work with CM for education and support    Care Plan : Heart Failure (Adult)  Updates made by Jon Billings, RN since 05/12/2021 12:00 AM     Problem: Symptom Exacerbation (Heart Failure)      Long-Range Goal: Symptom Exacerbation Prevented or Minimized as evidenced by no acute exacerbations of heart failure   Start Date: 05/12/2021  Expected End Date: 02/04/2022  Priority: High  Note:   Evidence-based guidance:  Perform or review cognitive and/or health literacy screening.  Assess understanding of adherence and barriers to treatment plan, as well as lifestyle changes; develop strategies to address barriers.  Establish a mutually-agreed-upon early intervention process to communicate with primary care provider when signs/symptoms worsen.  Facilitate timely posthospital discharge or emergency department  treatment that includes intensive follow-up via telephone calls, home visit, telehealth monitoring and care at multidisciplinary heart failure clinic.  Adjust frequency and intensity of follow-up based on presentation, number of emergency department visits, hospital admissions and frequency and severity of symptom exacerbation.  Facilitate timely visit, usually within 1 week, with primary care provider following hospital discharge.  Collaborate with clinical pharmacist to address adverse drug reactions, drug interactions, subtherapeutic dosage, patient and family education.  Regularly screen for presence of depressive symptoms using a validated tool; consider pharmacologic therapy and/or referral for cognitive behavioral therapy when present.  Refer to community-based services, such as a heart failure support group, community Economist or peer support program.  Review immunization status; arrange receipt of needed vaccinations.  Prepare patient for home oxygen use based on signs/symptoms.   Notes:     Task: Identify and Minimize Risk of Heart Failure Exacerbation   Due Date: 02/04/2022  Priority: Routine  Responsible User: Jon Billings, RN  Note:   Care Management Activities:    - healthy lifestyle promoted - in-home support services arranged - medication-adherence assessment completed - rescue (action) plan reviewed    Notes: 05/12/21 Daughter is primary caregiver.  She is able to describe daily regimen for heart failure and when to call physician. She makes sure patient weighs daily. Weight 193.6 lbs and daughter is recording it.   Heart Failure Management Discussed Please weight daily or as ordered by your doctor Report to your doctor weight gain of 2 -3 pounds in a day or 5 pounds in a week Limit salt intake Monitor for shortness of breath, swelling of feet, ankles or abdomen and weight gain. Never use the saltshaker.  Read all food labels and avoid canned, processed, and pickled  foods.   Follow your doctor's recommendations for daily salt intake      Goals Addressed               This Visit's Progress     [place] My Quality of Life (pt-stated)        Barriers: Knowledge Timeframe:  Short-Term Goal Priority:  High Start Date:    05/12/21                         Expected End Date:07/07/21                       Follow  Up Date 06/07/21   - check out options for in-home help, long-term care or hospice  Patient to participate in home health ordered post hospitalization   Why is this important?   Having a long-term illness can be scary.  It can also be stressful for you and your caregiver.  These steps may help.    Notes: 05/12/21 Daughter cares for patient 24/7.  Home health has been ordered with encouragement to participate to maintain current function. Daughter will follow up with in home resources as finances allow.        Track and Manage Symptoms-Heart Failure        Barriers: Health Behaviors Knowledge Timeframe:  Long-Range Goal Priority:  High Start Date:     05/12/21                        Expected End Date: 02/04/22  Follow Up Date 06/07/21   - eat more whole grains, fruits and vegetables, lean meats and healthy fats - follow rescue plan if symptoms flare-up - know when to call the doctor    Why is this important?   You will be able to handle your symptoms better if you keep track of them.  Making some simple changes to your lifestyle will help.  Eating healthy is one thing you can do to take good care of yourself.    Notes: Daughter makes sure patient weighs daily. Today's weight 193.6 lbs.  Daughter able to describe regimen and actions for changes in heart failure.   Heart Failure Management Discussed Please weight daily or as ordered by your doctor Report to your doctor weight gain of 2 -3 pounds in a day or 5 pounds in a week Limit salt intake Monitor for shortness of breath, swelling of feet, ankles or abdomen and weight  gain. Never use the saltshaker.  Read all food labels and avoid canned, processed, and pickled foods.   Follow your doctor's recommendations for daily salt intake        Plan: RN CM will provide ongoing education and support to patient through phone calls.   RN CM will send welcome packet with consent to patient.   RN CM will send initial barriers letter, assessment, and care plan to primary care physician.   Follow-up: Patient agrees to Care Plan and Follow-up. Follow-up in 2-3 week(s)  [name], RN, MSN [place] [facility] Care Management Coordinator Direct Line [phone] Cell [phone] Toll Free: [phone]  Fax: [phone]

## 2021-05-12 NOTE — Patient Instructions (Signed)
Goals       [facility] My Quality of Life (pt-stated)      Barriers: Knowledge Timeframe:  Short-Term Goal Priority:  High Start Date:    05/12/21                         Expected End Date:07/07/21                       Follow Up Date 06/07/21   - check out options for in-home help, long-term care or hospice  Patient to participate in home health ordered post hospitalization   Why is this important?   Having a long-term illness can be scary.  It can also be stressful for you and your caregiver.  These steps may help.    Notes: 05/12/21 Daughter cares for patient 24/7.  Home health has been ordered with encouragement to participate to maintain current function. Daughter will follow up with in home resources as finances allow.        Track and Manage Symptoms-Heart Failure      Barriers: Health Behaviors Knowledge Timeframe:  Long-Range Goal Priority:  High Start Date:     05/12/21                        Expected End Date: 02/04/22  Follow Up Date 06/07/21   - eat more whole grains, fruits and vegetables, lean meats and healthy fats - follow rescue plan if symptoms flare-up - know when to call the doctor    Why is this important?   You will be able to handle your symptoms better if you keep track of them.  Making some simple changes to your lifestyle will help.  Eating healthy is one thing you can do to take good care of yourself.    Notes: Daughter makes sure patient weighs daily. Today's weight 193.6 lbs.  Daughter able to describe regimen and actions for changes in heart failure.   Heart Failure Management Discussed Please weight daily or as ordered by your doctor Report to your doctor weight gain of 2 -3 pounds in a day or 5 pounds in a week Limit salt intake Monitor for shortness of breath, swelling of feet, ankles or abdomen and weight gain. Never use the saltshaker.  Read all food labels and avoid canned, processed, and pickled foods.   Follow your doctor's  recommendations for daily salt intake

## 2021-05-13 ENCOUNTER — Telehealth: Payer: Self-pay | Admitting: *Deleted

## 2021-05-13 NOTE — Telephone Encounter (Signed)
Transition Care Management Follow-up Telephone Call Date of discharge and from where: 05/11/2021 [place] How have you been since you were released from the hospital? Getting better, still weak Any questions or concerns? No  Items Reviewed: Did the pt receive and understand the discharge instructions provided? Yes  Medications obtained and verified? Yes on Antibiotic Other? No  Any new allergies since your discharge? No  Dietary orders reviewed? Yes Do you have support at home? Yes   Home Care and Equipment/Supplies: Were home health services ordered? yes If so, what is the name of the agency? [place]  Has the agency set up a time to come to the patient's home? yes Were any new equipment or medical supplies ordered?  No What is the name of the medical supply agency? [facility] Were you able to get the supplies/equipment? not applicable Do you have any questions related to the use of the equipment or supplies? No  Functional Questionnaire: (I = Independent and D = Dependent) ADLs: I with assistance -Walker  Bathing/Dressing- D  Meal Prep- D  Eating- I  Maintaining continence- I  Transferring/Ambulation- I with assistance  Managing Meds- D  Follow up appointments reviewed:  PCP Hospital f/u appt confirmed? Yes  Scheduled to see Dinah on 05/20/2021 @ 11. [facility] f/u appt confirmed? No   Are transportation arrangements needed? No  If their condition worsens, is the pt aware to call PCP or go to the Emergency Dept.? Yes Was the patient provided with contact information for the PCP's office or ED? Yes Was to pt encouraged to call back with questions or concerns? Yes

## 2021-05-13 NOTE — Telephone Encounter (Signed)
Transition Care Management Unsuccessful Follow-up Telephone Call  Date of discharge and from where:  05/11/2021 [place]  Attempts:  1st Attempt  Reason for unsuccessful TCM follow-up call:  Unable to leave message Voicemail is full and cannot leave message. Also tried home phone.

## 2021-05-17 DIAGNOSIS — I5033 Acute on chronic diastolic (congestive) heart failure: Secondary | ICD-10-CM | POA: Diagnosis not present

## 2021-05-17 DIAGNOSIS — N1832 Chronic kidney disease, stage 3b: Secondary | ICD-10-CM | POA: Diagnosis not present

## 2021-05-17 DIAGNOSIS — J9601 Acute respiratory failure with hypoxia: Secondary | ICD-10-CM | POA: Diagnosis not present

## 2021-05-17 DIAGNOSIS — I130 Hypertensive heart and chronic kidney disease with heart failure and stage 1 through stage 4 chronic kidney disease, or unspecified chronic kidney disease: Secondary | ICD-10-CM | POA: Diagnosis not present

## 2021-05-17 DIAGNOSIS — I739 Peripheral vascular disease, unspecified: Secondary | ICD-10-CM | POA: Diagnosis not present

## 2021-05-17 DIAGNOSIS — I480 Paroxysmal atrial fibrillation: Secondary | ICD-10-CM | POA: Diagnosis not present

## 2021-05-17 DIAGNOSIS — I2510 Atherosclerotic heart disease of native coronary artery without angina pectoris: Secondary | ICD-10-CM | POA: Diagnosis not present

## 2021-05-17 DIAGNOSIS — I214 Non-ST elevation (NSTEMI) myocardial infarction: Secondary | ICD-10-CM | POA: Diagnosis not present

## 2021-05-17 DIAGNOSIS — J209 Acute bronchitis, unspecified: Secondary | ICD-10-CM | POA: Diagnosis not present

## 2021-05-18 ENCOUNTER — Telehealth: Payer: Self-pay | Admitting: *Deleted

## 2021-05-18 NOTE — Telephone Encounter (Signed)
Gracie with [facility] called requesting verbal orders for PT 2X4wks and 1X5wks and a Education officer, museum.   Verbal orders given.

## 2021-05-20 ENCOUNTER — Encounter: Payer: Self-pay | Admitting: Family

## 2021-05-20 ENCOUNTER — Other Ambulatory Visit: Payer: Self-pay

## 2021-05-20 ENCOUNTER — Ambulatory Visit (INDEPENDENT_AMBULATORY_CARE_PROVIDER_SITE_OTHER): Payer: Medicare HMO | Admitting: Family

## 2021-05-20 VITALS — BP 118/70 | HR 73 | Temp 97.3°F | Resp 16 | Ht 61.0 in | Wt 195.4 lb

## 2021-05-20 DIAGNOSIS — R269 Unspecified abnormalities of gait and mobility: Secondary | ICD-10-CM

## 2021-05-20 DIAGNOSIS — I2510 Atherosclerotic heart disease of native coronary artery without angina pectoris: Secondary | ICD-10-CM | POA: Diagnosis not present

## 2021-05-20 DIAGNOSIS — E039 Hypothyroidism, unspecified: Secondary | ICD-10-CM | POA: Diagnosis not present

## 2021-05-20 DIAGNOSIS — I509 Heart failure, unspecified: Secondary | ICD-10-CM

## 2021-05-20 DIAGNOSIS — I10 Essential (primary) hypertension: Secondary | ICD-10-CM | POA: Diagnosis not present

## 2021-05-20 DIAGNOSIS — F411 Generalized anxiety disorder: Secondary | ICD-10-CM

## 2021-05-20 DIAGNOSIS — E876 Hypokalemia: Secondary | ICD-10-CM | POA: Diagnosis not present

## 2021-05-20 DIAGNOSIS — E785 Hyperlipidemia, unspecified: Secondary | ICD-10-CM | POA: Diagnosis not present

## 2021-05-20 MED ORDER — FUROSEMIDE 40 MG PO TABS: ORAL_TABLET | ORAL | 1 refills | Status: DC

## 2021-05-20 MED ORDER — NITROGLYCERIN 0.4 MG SL SUBL: 0.4000 mg | SUBLINGUAL_TABLET | SUBLINGUAL | 1 refills | Status: DC | PRN

## 2021-05-20 MED ORDER — LEVOTHYROXINE SODIUM 175 MCG PO TABS: 175.0000 ug | ORAL_TABLET | Freq: Every day | ORAL | 1 refills | Status: DC

## 2021-05-20 MED ORDER — ALPRAZOLAM 0.5 MG PO TABS: 0.5000 mg | ORAL_TABLET | Freq: Every day | ORAL | 0 refills | Status: DC | PRN

## 2021-05-20 NOTE — Patient Instructions (Signed)
-   continue to monitor weight at home and notify provider's office for any abrupt weight gain   - Follow up with Cardiologist as advised from the Hospital  - wean off oxygen as tolerated   - Continue with Physical Therapy   - Standard wheelchair order will be faxed to [facility]

## 2021-05-20 NOTE — Progress Notes (Signed)
Provider: Dodge Ator FNP-C  Lauree Chandler, NP  Patient Care Team: Lauree Chandler, NP as PCP - General (Geriatric Medicine) Lorretta Harp, MD as PCP - Cardiology (Cardiology) Irene Shipper, MD as Consulting Physician (Gastroenterology) Frederik Pear, MD as Consulting Physician (Orthopedic Surgery) Fontaine, Belinda Block, MD (Inactive) as Consulting Physician (Gynecology) Rutherford Guys, MD as Consulting Physician (Ophthalmology) Bjorn Loser, MD as Consulting Physician (Urology) Serafina Mitchell, MD as Consulting Physician (Vascular Surgery) Cameron Sprang, MD as Consulting Physician (Neurology) Desma Maxim, MD as Referring Physician (Ophthalmology) Thayer Headings, PMHNP as Nurse Practitioner (Psychiatry) Cameron Sprang, MD as Consulting Physician (Neurology) Jon Billings, RN as [place] [employer]  Extended Emergency Contact Information Primary Emergency Contact: Rogers, Dougherty [address] [address]itter of [place] Phone: [phone] Mobile Phone: [phone] Relation: Daughter  Code Status:  Full Code  Goals of care: Advanced Directive information Advanced Directives 05/20/2021  Does Patient Have a Medical Advance Directive? No  Type of Advance Directive -  Does patient want to make changes to medical advance directive? No - Patient declined  Copy of [place] in Chart? -  Would patient like information on creating a medical advance directive? -  Pre-existing out of facility DNR order (yellow form or pink MOST form) -     Chief Complaint  Patient presents with   Transitions Of Care    [facility] 05/09/2021-05/11/2021   Concern     Moderate Falls Risk    HPI:  Pt is a 80 y.o. female seen today for an acute visit for TOC post Hospitalization from 05/09/2021 - 05/11/2021 for acute hypoxic respiratory failure suspected due to acute bronchitis.CXR showed no acute findings.Her troponin was  elevated 45 > 137>375 > 273 was evaluated by cardiology suspected demand ischemia no further work-up.BNP 611 Cardio thought not in acute CHF TTE EF 50-55 % Lasix was resumed and continued on home cardiac medication.Had hypokalemia was replete.Her CR peaked to 2.2 but trended to her baseline on discharge 1.73 she was discharged home with Home health Physical Therapy and Occupation therapy. States feeling much post hospital discharged.  She request standard WC with Cushion, anti tippers, extended brake handles, removable elevating leg rests  to enable her to maintain current level of independence which cannot be achieved with walker or cane. Patient suffers from congestive heart Failure and obesity which impairs their ability to perform daily activities like bathing,walking,dressing,grooming and toileting in the home.   Past Medical History:  Diagnosis Date   Anxiety    Bacteremia, escherichia coli 04/27/2015   Brachial-basilar insufficiency syndrome    CAD (coronary artery disease)    Celiac artery stenosis (HCC)    Chronic bilateral low back pain without sciatica    Chronic combined systolic (congestive) and diastolic (congestive) heart failure ([place]) 11/14/2017   CKD (chronic kidney disease), stage IV (HCC)    Cognitive impairment    Colon polyps    Community acquired pneumonia    Depression    Diverticulosis    Dysuria    Encephalomalacia    GAD (generalized anxiety disorder)    GERD (gastroesophageal reflux disease)    Glaucoma    Hearing loss    Hemorrhoids    Hypertension    Hypothyroidism    Incontinence    Mixed hyperlipidemia    Myocardial infarction (HCC)    OSA on CPAP    PAF (paroxysmal atrial fibrillation) ([place])  Peripheral arterial disease (HCC)    Pneumonitis 04/26/2015   Pyelonephritis due to Escherichia coli 04/28/2015   Sepsis ([place])    Spondylosis of lumbar region without myelopathy or radiculopathy    TBI (traumatic brain injury)    Urticaria    Vertigo,  benign positional    Past Surgical History:  Procedure Laterality Date   ABDOMINAL AORTOGRAM W/LOWER EXTREMITY N/A 08/22/2017   Procedure: ABDOMINAL AORTOGRAM W/LOWER EXTREMITY;  Surgeon: Serafina Mitchell, MD;  Location: [place] [facility];  Service: Cardiovascular;  Laterality: N/A;   BILATERAL UPPER EXTREMITY ANGIOGRAM N/A 09/11/2012   Procedure: BILATERAL UPPER EXTREMITY ANGIOGRAM;  Surgeon: Serafina Mitchell, MD;  Location: [facility] CATH LAB;  Service: Cardiovascular;  Laterality: N/A;   BIOPSY  03/25/2020   Procedure: BIOPSY;  Surgeon: Irving Copas., MD;  Location: [place];  Service: Gastroenterology;;   carotid duplex doppler Bilateral 09/03/2012, 11/03/2011   Evidence of 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 patent left common carotid- subclavian artery bypass with turbulent flow noted at the anastomosis with velocities of 295 cm/s   CAROTID-SUBCLAVIAN BYPASS GRAFT  12/15/2011   Procedure: BYPASS GRAFT CAROTID-SUBCLAVIAN;  Surgeon: Serafina Mitchell, MD;  Location: [facility] OR;  Service: Vascular;  Laterality: Left;  Left Carotid subclavian bypass   CORONARY ANGIOPLASTY     CORONARY ARTERY BYPASS GRAFT     CORONARY ATHERECTOMY N/A 03/19/2020   Procedure: CORONARY ATHERECTOMY;  Surgeon: Jettie Booze, MD;  Location: [place] [facility];  Service: Cardiovascular;  Laterality: N/A;   CORONARY STENT INTERVENTION N/A 03/19/2020   Procedure: CORONARY STENT INTERVENTION;  Surgeon: Jettie Booze, MD;  Location: [place] [facility];  Service: Cardiovascular;  Laterality: N/A;   DOPPLER ECHOCARDIOGRAPHY  05/27/2010, 09/17/2008   Mild Proximal septal thickening is noted. Left ventricular systolic functions is normal ejection fraction =>55%. the aortic valve appears to be mildly sclerotic    ESOPHAGOGASTRODUODENOSCOPY (EGD) WITH PROPOFOL N/A 03/25/2020    Procedure: ESOPHAGOGASTRODUODENOSCOPY (EGD) WITH PROPOFOL;  Surgeon: Rush Landmark Telford Nab., MD;  Location: [place];  Service: Gastroenterology;  Laterality: N/A;   fem-fem bypass graft  1999   holter monitor  01/21/2008   The predominant rhythm was normal sinus rhythm. Minimum heartrate of 63 bpm at +01:00, maximum heartrate of 105 bpm at + 10:35; and the average heartrate of 75 bpm. Ventricular ectopic activity totaled 1270: Multifocal; 866-PVC's and 404-VEs              JOINT REPLACEMENT     Left knee   LEFT HEART CATH AND CORS/GRAFTS ANGIOGRAPHY N/A 03/19/2020   Procedure: LEFT HEART CATH AND CORS/GRAFTS ANGIOGRAPHY;  Surgeon: Jettie Booze, MD;  Location: [place] [facility];  Service: Cardiovascular;  Laterality: N/A;   LEFT HEART CATHETERIZATION WITH CORONARY/GRAFT ANGIOGRAM N/A 12/21/2011   Procedure: LEFT HEART CATHETERIZATION WITH Beatrix Fetters;  Surgeon: Leonie Man, MD;  Location[facility] CATH LAB;  Service: Cardiovascular;  Laterality: N/A;   NM MYOCAR PERF EJECTION FRACTION  09/22/2009, 07/03/2007   the post stress myocardial perfusion images show a normal pattern of perfusion is all regions. The post-stress ejection fraction is 68 %. no significant wall motion abnormalities noted. This is a low risk scan.   PERIPHERAL VASCULAR INTERVENTION  08/22/2017   Procedure: PERIPHERAL VASCULAR INTERVENTION;  Surgeon: Serafina Mitchell, MD;  Location: [place] [facility];  Service: Cardiovascular;;  Fem/Fem Graft  REPLACEMENT TOTAL KNEE  05-2011   UNILATERAL UPPER EXTREMEITY ANGIOGRAM Left 11/15/2011   Procedure: UNILATERAL UPPER Anselmo Rod;  Surgeon: Lorretta Harp, MD;  Location: [facility] CATH LAB;  Service: Cardiovascular;  Laterality: Left;    Allergies  Allergen Reactions   Donepezil Other (See Comments)    Altered mood, aggression, and caused anger   Duloxetine Hcl     Increased confusion and memory concerns    Meloxicam     Pt suspects that this medicine  causes fluid on her lungs.    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 1 by mouth in the morning and 1 by mouth in the pm.   aspirin EC 81 MG tablet Take 81 mg by mouth at bedtime.    cholecalciferol (VITAMIN D) 400 units TABS tablet Take 400 Units by mouth daily.   clopidogrel (PLAVIX) 75 MG tablet TAKE 1 TABLET(75 MG) BY MOUTH DAILY   divalproex (DEPAKOTE ER) 250 MG 24 hr tablet Take 1 tablet (250 mg total) by mouth at bedtime.   dorzolamide (TRUSOPT) 2 % ophthalmic solution Place 1 drop into both eyes 2 (two) times daily.   escitalopram (LEXAPRO) 20 MG tablet Take 1 tablet (20 mg total) by mouth daily.   FARXIGA 10 MG TABS tablet TAKE 1 TABLET(10 MG) BY MOUTH DAILY   furosemide (LASIX) 40 MG tablet TAKE 1 TABLET(40 MG) BY MOUTH DAILY   gabapentin (NEURONTIN) 100 MG capsule TAKE 2 CAPSULES(200 MG) BY MOUTH DAILY   latanoprost (XALATAN) 0.005 % ophthalmic solution Place 1 drop into both eyes at bedtime.   meclizine (ANTIVERT) 25 MG tablet Take 1 tablet (25 mg total) by mouth as needed for dizziness.   metoprolol tartrate (LOPRESSOR) 25 MG tablet Take 12.5 mg by mouth 2 (two) times daily.   pantoprazole (PROTONIX) 40 MG tablet TAKE 1 TABLET EVERY DAY   rosuvastatin (CRESTOR) 40 MG tablet TAKE 1 TABLET(40 MG) BY MOUTH DAILY   tiZANidine (ZANAFLEX) 2 MG tablet Take 1 tablet (2 mg total) by mouth every 6 (six) hours as needed for muscle spasms.   valsartan (DIOVAN) 80 MG tablet TAKE 1 TABLET EVERY MORNING   vitamin B-12 (CYANOCOBALAMIN) 1000 MCG tablet Take 1,000 mcg by mouth daily.   [DISCONTINUED] ALPRAZolam (XANAX) 0.5  MG tablet Take 1 tablet (0.5 mg total) by mouth daily as needed for anxiety.   [DISCONTINUED] levothyroxine (SYNTHROID) 175 MCG tablet Take 1 tablet (175 mcg total) by mouth daily. Take at the same time daily separate from other medications   [DISCONTINUED] nitroGLYCERIN (NITROSTAT) 0.4 MG SL tablet Place 1 tablet (0.4 mg total) under the tongue every 5 (five) minutes x 3 doses as needed for chest pain.   ALPRAZolam (XANAX) 0.5 MG tablet Take 1 tablet (0.5 mg total) by mouth daily as needed for anxiety.   levothyroxine (SYNTHROID) 175 MCG tablet Take 1 tablet (175 mcg total) by mouth daily. Take at the same time daily separate from other medications   nitroGLYCERIN (NITROSTAT) 0.4 MG SL tablet Place 1 tablet (0.4 mg total) under the tongue every 5 (five) minutes x 3 doses as needed  for chest pain.   [DISCONTINUED] metoprolol succinate (TOPROL-XL) 25 MG 24 hr tablet Take 1 tablet (25 mg total) by mouth daily. (Patient taking differently: No sig reported)   No facility-administered encounter medications on file as of 05/20/2021.    Review of Systems  Constitutional:  Negative for appetite change, chills, fatigue, fever and unexpected weight change.  HENT:  Negative for congestion, dental problem, ear discharge, ear pain, facial swelling, hearing loss, nosebleeds, postnasal drip, rhinorrhea, sinus pressure, sinus pain, sneezing, sore throat, tinnitus and trouble swallowing.   Eyes:  Negative for pain, discharge, redness, itching and visual disturbance.  Respiratory:  Negative for cough, chest tightness and wheezing.        Shortness of breath with exertion.  Cardiovascular:  Negative for chest pain, palpitations and leg swelling.  Gastrointestinal:  Negative for abdominal distention, abdominal pain, blood in stool, constipation, diarrhea, nausea and vomiting.  Endocrine: Negative for cold intolerance, heat intolerance, polydipsia, polyphagia and polyuria.  Genitourinary:  Negative for difficulty  urinating, dysuria, flank pain, frequency and urgency.  Musculoskeletal:  Positive for arthralgias, back pain and gait problem. Negative for joint swelling, myalgias, neck pain and neck stiffness.  Skin:  Negative for color change, pallor, rash and wound.  Neurological:  Negative for dizziness, syncope, speech difficulty, weakness, light-headedness, numbness and headaches.  Hematological:  Does not bruise/bleed easily.  Psychiatric/Behavioral:  Negative for agitation, behavioral problems, confusion, hallucinations, self-injury, sleep disturbance and suicidal ideas. The patient is not nervous/anxious.        Memory loss    Immunization History  Administered Date(s) Administered   Fluad Quad(high Dose 65+) 04/08/2019, 05/06/2020, 04/07/2021   Influenza, High Dose Seasonal PF 06/22/2016, 07/13/2017, 05/31/2018   Influenza,inj,Quad PF,6+ Mos 06/11/2013   PFIZER(Purple Top)SARS-COV-2 Vaccination 09/22/2019, 10/15/2019, 08/18/2020   Pneumococcal Conjugate-13 11/25/2016   Pneumococcal Polysaccharide-23 07/08/2015   Zoster, Live 06/22/2016   Pertinent  Health Maintenance Due  Topic Date Due   COLONOSCOPY (Pts 45-86yr Insurance coverage will need to be confirmed)  12/22/2020   INFLUENZA VACCINE  Completed   DEXA SCAN  Completed   Fall Risk  05/20/2021 05/12/2021 05/12/2021 04/15/2021 04/07/2021  Falls in the past year? _0 0 1  Number falls in past yr: 1 1 0 0 1  Injury with Fall? 0 0 0 0 0  Risk for fall due to : History of fall(s) - History of fall(s) No Fall Risks History of fall(s)  Follow up Falls evaluation completed;Education provided;Falls prevention discussed - Education provided Falls evaluation completed Falls evaluation completed   Functional Status Survey:    Vitals:   05/20/21 1120  BP: 118/70  Pulse: 73  Resp: 16  Temp: (!) 97.3 F (36.3 C)  SpO2: 96%  Weight: 195 lb 6.4 oz (88.6 kg)  Height: _1  (1.549 m)   Body mass index is 36.92 kg/m. Physical Exam Vitals  reviewed.  Constitutional:      General: She is not in acute distress.    Appearance: Normal appearance. She is obese. She is not ill-appearing or diaphoretic.     Interventions: Nasal cannula in place.  HENT:     Head: Normocephalic.     Right Ear: Tympanic membrane, ear canal and external ear normal. There is no impacted cerumen.     Left Ear: Tympanic membrane, ear canal and external ear normal. There is no impacted cerumen.     Nose: Nose normal. No congestion or rhinorrhea.     Mouth/Throat:     Mouth:  Mucous membranes are moist.     Pharynx: Oropharynx is clear. No oropharyngeal exudate or posterior oropharyngeal erythema.  Eyes:     General: No scleral icterus.       Right eye: No discharge.        Left eye: No discharge.     Extraocular Movements: Extraocular movements intact.     Conjunctiva/sclera: Conjunctivae normal.     Pupils: Pupils are equal, round, and reactive to light.  Neck:     Vascular: No carotid bruit.  Cardiovascular:     Rate and Rhythm: Normal rate and regular rhythm.     Pulses: Normal pulses.     Heart sounds: Normal heart sounds. No murmur heard.   No friction rub. No gallop.  Pulmonary:     Effort: Pulmonary effort is normal. No respiratory distress.     Breath sounds: Normal breath sounds. No wheezing, rhonchi or rales.  Chest:     Chest wall: No tenderness.  Abdominal:     General: Bowel sounds are normal. There is no distension.     Palpations: Abdomen is soft. There is no mass.     Tenderness: There is no abdominal tenderness. There is no right CVA tenderness, left CVA tenderness, guarding or rebound.  Musculoskeletal:        General: No swelling or tenderness. Normal range of motion.     Cervical back: Normal range of motion. No rigidity or tenderness.     Right lower leg: No edema.     Left lower leg: No edema.     Comments: Unsteady gait   Lymphadenopathy:     Cervical: No cervical adenopathy.  Skin:    General: Skin is warm and dry.      Coloration: Skin is not pale.     Findings: No bruising, erythema, lesion or rash.  Neurological:     Mental Status: She is alert. Mental status is at baseline.     Cranial Nerves: No cranial nerve deficit.     Sensory: No sensory deficit.     Motor: No weakness.     Coordination: Coordination normal.     Gait: Gait abnormal.  Psychiatric:        Mood and Affect: Mood normal.        Speech: Speech normal.        Behavior: Behavior normal.        Thought Content: Thought content normal.        Judgment: Judgment normal.    Labs reviewed: Recent Labs    05/09/21 0759 05/10/21 0142 05/11/21 0338  NA 139 136 137  K 4.3 4.4 4.4  CL 104 103 102  CO2 21* 23 26  GLUCOSE 215* 114* 98  BUN 21 28* 34*  CREATININE 2.09* 1.65* 1.73*  CALCIUM 9.2 9.0 9.1  MG 1.8  --   --    Recent Labs    12/07/20 1356 04/07/21 1457 05/09/21 0042  AST _0 ALT _1 ALKPHOS  --   --  66  BILITOT 0.7 0.7 1.1  PROT 6.6 6.9 6.2*  ALBUMIN  --   --  3.5   Recent Labs    12/07/20 1356 04/07/21 1457 05/09/21 0042 05/10/21 0142 05/11/21 0338  WBC 7.7 8.8 8.9 9.1 6.7  NEUTROABS 4,635 4,462 6.8  --   --   HGB 12.2 13.7 12.3 11.9* 11.6*  HCT 37.0 43.0 39.6 36.7 36.2  MCV 87.1 86.2 88.6 85.7 86.0  PLT 154 195 151 167 165   Lab Results  Component Value Date   TSH 25.54 (H) 04/07/2021   Lab Results  Component Value Date   HGBA1C 5.8 (H) 03/18/2020   Lab Results  Component Value Date   CHOL 110 05/10/2021   HDL 34 (L) 05/10/2021   LDLCALC 44 05/10/2021   TRIG 160 (H) 05/10/2021   CHOLHDL 3.2 05/10/2021    Significant Diagnostic Results in last 30 days:  NM Pulmonary Perfusion  Result Date: 05/10/2021 CLINICAL DATA:  Chest pain and shortness of breath since admission, history of smoking, coronary artery disease post bypass surgery EXAM: NUCLEAR MEDICINE PERFUSION LUNG SCAN TECHNIQUE: Perfusion images were obtained in multiple projections after intravenous injection of  radiopharmaceutical. Ventilation scans intentionally deferred if perfusion scan and chest x-ray adequate for interpretation during COVID 19 epidemic. RADIOPHARMACEUTICALS:  3.8 mCi Tc-93mMAA IV COMPARISON:  Chest radiograph 05/10/2021 FINDINGS: Normal perfusion lung scan. No perfusion defects identified. IMPRESSION: Normal perfusion lung scan; pulmonary embolism absent. Electronically Signed   By: MLavonia DanaM.D.   On: 05/10/2021 16:51   DG Chest Port 1 View  Result Date: 05/10/2021 CLINICAL DATA:  80year old female with history of shortness of breath and respiratory distress. EXAM: PORTABLE CHEST 1 VIEW COMPARISON:  Chest x-ray 05/09/2021. FINDINGS: Lung volumes are low. No consolidative airspace disease. No pleural effusions. No pneumothorax. No pulmonary nodule or mass noted. Pulmonary vasculature and the cardiomediastinal silhouette are within normal limits. Atherosclerotic calcifications in the thoracic aorta. Status post median sternotomy for CABG including LIMA to the LAD. Multiple surgical clips are noted in the lower left cervical region. Old healed right-sided rib fractures are again noted. Ossification of the coracoclavicular ligament and elevation of the distal right clavicle relative to the acromion, with old healed distal right clavicular fracture, related to remote trauma. IMPRESSION: 1. No radiographic evidence of acute cardiopulmonary disease. 2. Aortic atherosclerosis. Electronically Signed   By: DVinnie LangtonM.D.   On: 05/10/2021 08:35   DG Chest Port 1 View  Result Date: 05/09/2021 CLINICAL DATA:  Shortness of breath EXAM: PORTABLE CHEST 1 VIEW COMPARISON:  03/25/2020 FINDINGS: Cardiac shadow is within normal limits. Postsurgical changes are noted. Lungs are well aerated bilaterally. Previously seen airspace opacities have resolved in the interval. No new focal abnormality is seen. IMPRESSION: No acute abnormality noted. Electronically Signed   By: MInez CatalinaM.D.   On:  05/09/2021 00:58   ECHOCARDIOGRAM COMPLETE  Result Date: 05/09/2021    ECHOCARDIOGRAM REPORT   Patient Name:   Deanna WILLHITEDate of Exam: 05/09/2021 Medical Rec #:  [id number]      Height:       61.0 in Accession #:    [id number]     Weight:       185.0 lb Date of Birth:  4Jul 11, 1942      BSA:          1.827 m Patient Age:    89years        BP:           118/60 mmHg Patient Gender: F               HR:           82 bpm. Exam Location:  Inpatient Procedure: 2D Echo, Cardiac Doppler, Color Doppler and Intracardiac            Opacification Agent Indications:    NSTEMI I21.4  CHF-Acute Systolic N36.14  History:        Patient has prior history of Echocardiogram examinations, most                 recent 03/19/2020. CHF, CAD and Previous Myocardial Infarction,                 PAD; Risk Factors:Sleep Apnea. Thyroid disease, GERD.  Sonographer:    Darlina Sicilian RDCS Referring Phys: [id number] Luana  1. Left ventricular ejection fraction, by estimation, is 50 to 55%. The left ventricle has low normal function. The left ventricle demonstrates regional wall motion abnormalities (see scoring diagram/findings for description). There is mild concentric left ventricular hypertrophy. Left ventricular diastolic parameters are indeterminate. Elevated left ventricular end-diastolic pressure. There is akinesis of the left ventricular, apical septal wall, inferior wall and inferolateral wall. There is akinesis of the left ventricular, entire apical segment. There is hypokinesis of the left ventricular, mid-apical anteroseptal wall.  2. Right ventricular systolic function is normal. The right ventricular size is normal.  3. The mitral valve is normal in structure. Mild mitral valve regurgitation. No evidence of mitral stenosis. Moderate mitral annular calcification.  4. The aortic valve is tricuspid. Aortic valve regurgitation is not visualized. Mild aortic valve sclerosis is present, with no  evidence of aortic valve stenosis.  5. The inferior vena cava is normal in size with greater than 50% respiratory variability, suggesting right atrial pressure of 3 mmHg. FINDINGS  Left Ventricle: Left ventricular ejection fraction, by estimation, is 50 to 55%. The left ventricle has low normal function. The left ventricle demonstrates regional wall motion abnormalities. The left ventricular internal cavity size was normal in size. There is mild concentric left ventricular hypertrophy. Left ventricular diastolic parameters are indeterminate. Elevated left ventricular end-diastolic pressure. Right Ventricle: The right ventricular size is normal. No increase in right ventricular wall thickness. Right ventricular systolic function is normal. Left Atrium: Left atrial size was normal in size. Right Atrium: Right atrial size was normal in size. Pericardium: There is no evidence of pericardial effusion. Mitral Valve: The mitral valve is normal in structure. Moderate mitral annular calcification. Mild mitral valve regurgitation. No evidence of mitral valve stenosis. Tricuspid Valve: The tricuspid valve is normal in structure. Tricuspid valve regurgitation is mild . No evidence of tricuspid stenosis. Aortic Valve: The aortic valve is tricuspid. Aortic valve regurgitation is not visualized. Mild aortic valve sclerosis is present, with no evidence of aortic valve stenosis. Pulmonic Valve: The pulmonic valve was normal in structure. Pulmonic valve regurgitation is trivial. No evidence of pulmonic stenosis. Aorta: The aortic root is normal in size and structure. Venous: The inferior vena cava is normal in size with greater than 50% respiratory variability, suggesting right atrial pressure of 3 mmHg. IAS/Shunts: No atrial level shunt detected by color flow Doppler.  LEFT VENTRICLE PLAX 2D LVIDd:         4.30 cm     Diastology LVIDs:         2.60 cm     LV e' medial:    6.15 cm/s LV PW:         1.10 cm     LV E/e' medial:  18.7 LV  IVS:        1.20 cm     LV e' lateral:   8.03 cm/s LVOT diam:     1.70 cm     LV E/e' lateral: 14.3 LV SV:  40 LV SV Index:   22 LVOT Area:     2.27 cm  LV Volumes (MOD) LV vol d, MOD A2C: 71.7 ml LV vol d, MOD A4C: 80.0 ml LV vol s, MOD A2C: 39.1 ml LV vol s, MOD A4C: 35.6 ml LV SV MOD A2C:     32.6 ml LV SV MOD A4C:     80.0 ml LV SV MOD BP:      38.5 ml RIGHT VENTRICLE RV S prime:     8.55 cm/s TAPSE (M-mode): 1.3 cm LEFT ATRIUM             Index       RIGHT ATRIUM           Index LA diam:        4.10 cm 2.24 cm/m  RA Area:     10.70 cm LA Vol (A2C):   19.9 ml 10.89 ml/m RA Volume:   18.10 ml  9.91 ml/m LA Vol (A4C):   35.7 ml 19.54 ml/m LA Biplane Vol: 27.5 ml 15.05 ml/m  AORTIC VALVE LVOT Vmax:   65.50 cm/s LVOT Vmean:  47.400 cm/s LVOT VTI:    0.176 m  AORTA Ao Root diam: 2.70 cm MITRAL VALVE MV Area (PHT): 4.36 cm     SHUNTS MV Decel Time: 174 msec     Systemic VTI:  0.18 m MV E velocity: 115.00 cm/s  Systemic Diam: 1.70 cm MV A velocity: 121.00 cm/s MV E/A ratio:  0.95 Fransico Him MD Electronically signed by Fransico Him MD Signature Date/Time: 05/09/2021/1:08:10 PM    Final     Assessment/Plan  1. Essential hypertension B/p well controlled  Continue on metoprolol,valsartan and Furosemide  - BMP with eGFR([facility]) - CBC with Differential/Platelets  2. Chronic congestive heart failure, unspecified heart failure type (HCC) No signs of fluid overload  - For home use only DME Other see comment - furosemide (LASIX) 40 MG tablet; TAKE 1 TABLET(40 MG) BY MOUTH DAILY  Dispense: 90 tablet; Refill: 1 - continue to monitor weight at home and notify provider's office for any abrupt weight gain  - Follow up with Cardiologist as advised from the Hospital - wean off oxygen as tolerated   3. Hyperlipidemia LDL goal <70 LDL at goal  Continue on Crestor   4. Hypokalemia K+ 3.4 was replete 4.4 on discharge.will recheck today. - BMP with eGFR([facility])  5. Generalized anxiety disorder Stable   Continue on Alprazolam  - ALPRAZolam (XANAX) 0.5 MG tablet; Take 1 tablet (0.5 mg total) by mouth daily as needed for anxiety.  Dispense: 30 tablet; Refill: 0  6. Coronary artery disease involving native coronary artery of native heart without angina pectoris Chest pain free Continue on Nitro PRN and metoprolol,valsartan and Furosemide  - nitroGLYCERIN (NITROSTAT) 0.4 MG SL tablet; Place 1 tablet (0.4 mg total) under the tongue every 5 (five) minutes x 3 doses as needed for chest pain.  Dispense: 25 tablet; Refill: 1  7. Acquired hypothyroidism Lab Results  Component Value Date   TSH 25.54 (H) 04/07/2021   Continue on levothyroxine 175 mcg tablet daily on empty stomach   8. Gait abnormality Requires standard wheelchair with Cushion, anti tippers, extended brake handles, removable elevating leg rests  to enable her to maintain current level of independence which cannot be achieved with walker or cane. Patient suffers from congestive heart Failure and obesity which impairs their ability to perform daily activities like bathing,walking,dressing,grooming and toileting in the home.Script written faxed to DME  company by [place] today.  - For home use only DME Other see comment - Continue with Physical Therapy   Family/ staff Communication: Reviewed plan of care with patient and daughter verbalized understanding.  Labs/tests ordered:  - BMP with eGFR([facility]) - CBC with Differential/Platelets  Next Appointment: As needed if symptoms worsen or fail to improve.    Sandrea Hughs, NP

## 2021-05-21 DIAGNOSIS — I2510 Atherosclerotic heart disease of native coronary artery without angina pectoris: Secondary | ICD-10-CM | POA: Diagnosis not present

## 2021-05-21 DIAGNOSIS — I214 Non-ST elevation (NSTEMI) myocardial infarction: Secondary | ICD-10-CM | POA: Diagnosis not present

## 2021-05-21 DIAGNOSIS — J9601 Acute respiratory failure with hypoxia: Secondary | ICD-10-CM | POA: Diagnosis not present

## 2021-05-21 DIAGNOSIS — N1832 Chronic kidney disease, stage 3b: Secondary | ICD-10-CM | POA: Diagnosis not present

## 2021-05-21 DIAGNOSIS — I739 Peripheral vascular disease, unspecified: Secondary | ICD-10-CM | POA: Diagnosis not present

## 2021-05-21 DIAGNOSIS — I480 Paroxysmal atrial fibrillation: Secondary | ICD-10-CM | POA: Diagnosis not present

## 2021-05-21 DIAGNOSIS — I130 Hypertensive heart and chronic kidney disease with heart failure and stage 1 through stage 4 chronic kidney disease, or unspecified chronic kidney disease: Secondary | ICD-10-CM | POA: Diagnosis not present

## 2021-05-21 DIAGNOSIS — J209 Acute bronchitis, unspecified: Secondary | ICD-10-CM | POA: Diagnosis not present

## 2021-05-21 DIAGNOSIS — I5033 Acute on chronic diastolic (congestive) heart failure: Secondary | ICD-10-CM | POA: Diagnosis not present

## 2021-05-21 LAB — CBC WITH DIFFERENTIAL/PLATELET
Absolute Monocytes: 503 cells/uL (ref 200–950)
Basophils Absolute: 59 cells/uL (ref 0–200)
Basophils Relative: 0.8 %
Eosinophils Absolute: 81 cells/uL (ref 15–500)
Eosinophils Relative: 1.1 %
HCT: 39.4 % (ref 35.0–45.0)
Hemoglobin: 12.5 g/dL (ref 11.7–15.5)
Lymphs Abs: 2575 cells/uL (ref 850–3900)
MCH: 27.2 pg (ref 27.0–33.0)
MCHC: 31.7 g/dL — ABNORMAL LOW (ref 32.0–36.0)
MCV: 85.7 fL (ref 80.0–100.0)
MPV: 10 fL (ref 7.5–12.5)
Monocytes Relative: 6.8 %
Neutro Abs: 4181 cells/uL (ref 1500–7800)
Neutrophils Relative %: 56.5 %
Platelets: 165 10*3/uL (ref 140–400)
RBC: 4.6 10*6/uL (ref 3.80–5.10)
RDW: 13.6 % (ref 11.0–15.0)
Total Lymphocyte: 34.8 %
WBC: 7.4 10*3/uL (ref 3.8–10.8)

## 2021-05-21 LAB — COMPLETE METABOLIC PANEL WITH GFR
AG Ratio: 2 (calc) (ref 1.0–2.5)
ALT: 8 U/L (ref 6–29)
AST: 10 U/L (ref 10–35)
Albumin: 4.2 g/dL (ref 3.6–5.1)
Alkaline phosphatase (APISO): 73 U/L (ref 37–153)
BUN/Creatinine Ratio: 25 (calc) — ABNORMAL HIGH (ref 6–22)
BUN: 38 mg/dL — ABNORMAL HIGH (ref 7–25)
CO2: 26 mmol/L (ref 20–32)
Calcium: 9.6 mg/dL (ref 8.6–10.4)
Chloride: 103 mmol/L (ref 98–110)
Creat: 1.52 mg/dL — ABNORMAL HIGH (ref 0.60–1.00)
Globulin: 2.1 g/dL (calc) (ref 1.9–3.7)
Glucose, Bld: 95 mg/dL (ref 65–139)
Potassium: 4 mmol/L (ref 3.5–5.3)
Sodium: 138 mmol/L (ref 135–146)
Total Bilirubin: 0.7 mg/dL (ref 0.2–1.2)
Total Protein: 6.3 g/dL (ref 6.1–8.1)
eGFR: 35 mL/min/{1.73_m2} — ABNORMAL LOW (ref >=60)

## 2021-05-24 ENCOUNTER — Other Ambulatory Visit: Payer: Self-pay

## 2021-05-24 ENCOUNTER — Other Ambulatory Visit: Payer: Self-pay | Admitting: Nurse Practitioner

## 2021-05-24 DIAGNOSIS — I480 Paroxysmal atrial fibrillation: Secondary | ICD-10-CM | POA: Diagnosis not present

## 2021-05-24 DIAGNOSIS — E039 Hypothyroidism, unspecified: Secondary | ICD-10-CM

## 2021-05-24 DIAGNOSIS — J209 Acute bronchitis, unspecified: Secondary | ICD-10-CM | POA: Diagnosis not present

## 2021-05-24 DIAGNOSIS — I130 Hypertensive heart and chronic kidney disease with heart failure and stage 1 through stage 4 chronic kidney disease, or unspecified chronic kidney disease: Secondary | ICD-10-CM | POA: Diagnosis not present

## 2021-05-24 DIAGNOSIS — J9601 Acute respiratory failure with hypoxia: Secondary | ICD-10-CM | POA: Diagnosis not present

## 2021-05-24 DIAGNOSIS — I214 Non-ST elevation (NSTEMI) myocardial infarction: Secondary | ICD-10-CM | POA: Diagnosis not present

## 2021-05-24 DIAGNOSIS — I2510 Atherosclerotic heart disease of native coronary artery without angina pectoris: Secondary | ICD-10-CM | POA: Diagnosis not present

## 2021-05-24 DIAGNOSIS — I739 Peripheral vascular disease, unspecified: Secondary | ICD-10-CM | POA: Diagnosis not present

## 2021-05-24 DIAGNOSIS — N1832 Chronic kidney disease, stage 3b: Secondary | ICD-10-CM | POA: Diagnosis not present

## 2021-05-24 DIAGNOSIS — I5033 Acute on chronic diastolic (congestive) heart failure: Secondary | ICD-10-CM | POA: Diagnosis not present

## 2021-05-24 NOTE — Patient Outreach (Signed)
[facility]) Care Management  [place]  05/24/2021   Deanna [name] 02-21-1941 [id number]  Subjective: Telephone call to daughter Deanna [name]. She reports patient doing well.  Saw PCP last week and will see cardiologist next week.  Heart failure management continues. CM reiterated heart failure management.  No concerns.    Objective:   Encounter Medications:  Outpatient Encounter Medications as of 05/24/2021  Medication Sig   acetaminophen (TYLENOL) 325 MG tablet Take 325 mg by mouth in the morning and at bedtime. 1 by mouth in the morning and 1 by mouth in the pm.   ALPRAZolam (XANAX) 0.5 MG tablet Take 1 tablet (0.5 mg total) by mouth daily as needed for anxiety.   aspirin EC 81 MG tablet Take 81 mg by mouth at bedtime.    cholecalciferol (VITAMIN D) 400 units TABS tablet Take 400 Units by mouth daily.   clopidogrel (PLAVIX) 75 MG tablet TAKE 1 TABLET(75 MG) BY MOUTH DAILY   divalproex (DEPAKOTE ER) 250 MG 24 hr tablet Take 1 tablet (250 mg total) by mouth at bedtime.   dorzolamide (TRUSOPT) 2 % ophthalmic solution Place 1 drop into both eyes 2 (two) times daily.   escitalopram (LEXAPRO) 20 MG tablet Take 1 tablet (20 mg total) by mouth daily.   FARXIGA 10 MG TABS tablet TAKE 1 TABLET(10 MG) BY MOUTH DAILY   furosemide (LASIX) 40 MG tablet TAKE 1 TABLET(40 MG) BY MOUTH DAILY   gabapentin (NEURONTIN) 100 MG capsule TAKE 2 CAPSULES(200 MG) BY MOUTH DAILY   latanoprost (XALATAN) 0.005 % ophthalmic solution Place 1 drop into both eyes at bedtime.   levothyroxine (SYNTHROID) 175 MCG tablet Take 1 tablet (175 mcg total) by mouth daily. Take at the same time daily separate from other medications   meclizine (ANTIVERT) 25 MG tablet Take 1 tablet (25 mg total) by mouth as needed for dizziness.   metoprolol tartrate (LOPRESSOR) 25 MG tablet Take 12.5 mg by mouth 2 (two) times daily.   nitroGLYCERIN (NITROSTAT) 0.4 MG SL tablet Place 1 tablet (0.4 mg total) under the  tongue every 5 (five) minutes x 3 doses as needed for chest pain.   pantoprazole (PROTONIX) 40 MG tablet TAKE 1 TABLET EVERY DAY   rosuvastatin (CRESTOR) 40 MG tablet TAKE 1 TABLET(40 MG) BY MOUTH DAILY   tiZANidine (ZANAFLEX) 2 MG tablet Take 1 tablet (2 mg total) by mouth every 6 (six) hours as needed for muscle spasms.   valsartan (DIOVAN) 80 MG tablet TAKE 1 TABLET EVERY MORNING   vitamin B-12 (CYANOCOBALAMIN) 1000 MCG tablet Take 1,000 mcg by mouth daily.   No facility-administered encounter medications on file as of 05/24/2021.    Functional Status:  In your present state of health, do you have any difficulty performing the following activities: 05/12/2021 05/09/2021  Hearing? Y Y  Comment hearing loss -  Vision? Y N  Comment glaucoma -  Difficulty concentrating or making decisions? N N  Walking or climbing stairs? Tempie Donning  Comment assistance of 1 person -  Dressing or bathing? N Y  Comment daughter does -  Doing errands, shopping? N Y  Comment daughter does daughter does  Conservation officer, nature and eating ? Y -  Comment daughter does -  Using the Toilet? Y -  Comment daughter helps -  In the past six months, have you accidently leaked urine? Y -  Comment incontinent -  Do you have problems with loss of bowel control? N -  Managing your  Medications? Y -  Comment daughter does -  Managing your Finances? Y -  Comment daughter does -  Runner, broadcasting/film/video? Y -  Comment duaghter does -  Some recent data might be hidden    Fall/Depression Screening: Fall Risk  05/20/2021 05/12/2021 05/12/2021  Falls in the past year? 1 1 1   Number falls in past yr: 1 1 0  Injury with Fall? 0 0 0  Risk for fall due to : History of fall(s) - History of fall(s)  Follow up Falls evaluation completed;Education provided;Falls prevention discussed - Education provided   [facility] 2/9 Scores 05/12/2021 04/15/2021 09/02/2020 05/06/2020 04/08/2019 11/29/2017 11/17/2015  PHQ - 2 Score - 0 - 0 - 6 1   PHQ- 9 Score - - - - - 18 -  Exception Documentation Other- indicate reason in comment box - Other- indicate reason in comment box - Other- indicate reason in comment box - -  Not completed Daughter finishes assessment - Seeing psych - Already under the care [facility] - -    Assessment:   Care Plan Care Plan : General Plan of Care (Adult)  Updates made by Jon Billings, RN since 05/24/2021 12:00 AM     Problem: Therapeutic Alliance (General Plan of Care)      Goal: Therapeutic Alliance Established as evidenced by engagement with care manager   Start Date: 05/12/2021  Expected End Date: 07/07/2021  This Visit's Progress: On track  Priority: High  Note:   Evidence-based guidance:  Avoid value judgments; convey acceptance.  Encourage collaboration with the treatment team.  Establish rapport; develop trust relationship.  Therapist, art.  Provide emotional support; encourage patient to share feelings of anger, fear and anxiety.  Promote self-reliance and autonomy based on age and ability; discourage overprotection.  Use empathy and nonjudgmental, participatory manner.   Notes:     Task: Develop Relationship to Effect Behavior Change   Due Date: 07/07/2021  Priority: Routine  Responsible User: Jon Billings, RN  Note:   Care Management Activities:    - acceptance conveyed - care explained - collaboration with team encouraged - emotional support provided - questions encouraged - reassurance provided    Notes: 05/12/21 Daughter is the only caregiver for patient. No other family.  Deanna [name] not formally employed.  However, she will possibly want to hire help if she sees they can afford it as patient does not qualify for medicaid. She will explore resources if able.  She is in agreement to work with CM for education and support. 05/24/21 daughter continues to engage with CM.  She is primary caregiver for her mother.  She reports she has something lined up for [facility] for  in home care for evaluation.     Care Plan : Heart Failure (Adult)  Updates made by Jon Billings, RN since 05/24/2021 12:00 AM     Problem: Symptom Exacerbation (Heart Failure)      Long-Range Goal: Symptom Exacerbation Prevented or Minimized as evidenced by no acute exacerbations of heart failure   Start Date: 05/12/2021  Expected End Date: 02/04/2022  This Visit's Progress: On track  Priority: High  Note:   Evidence-based guidance:  Perform or review cognitive and/or health literacy screening.  Assess understanding of adherence and barriers to treatment plan, as well as lifestyle changes; develop strategies to address barriers.  Establish a mutually-agreed-upon early intervention process to communicate with primary care provider when signs/symptoms worsen.  Facilitate timely posthospital discharge or emergency department treatment that includes intensive follow-up  via telephone calls, home visit, telehealth monitoring and care at multidisciplinary heart failure clinic.  Adjust frequency and intensity of follow-up based on presentation, number of emergency department visits, hospital admissions and frequency and severity of symptom exacerbation.  Facilitate timely visit, usually within 1 week, with primary care provider following hospital discharge.  Collaborate with clinical pharmacist to address adverse drug reactions, drug interactions, subtherapeutic dosage, patient and family education.  Regularly screen for presence of depressive symptoms using a validated tool; consider pharmacologic therapy and/or referral for cognitive behavioral therapy when present.  Refer to community-based services, such as a heart failure support group, community Economist or peer support program.  Review immunization status; arrange receipt of needed vaccinations.  Prepare patient for home oxygen use based on signs/symptoms.   Notes:     Task: Identify and Minimize Risk of Heart Failure Exacerbation    Due Date: 02/04/2022  Priority: Routine  Responsible User: Jon Billings, RN  Note:   Care Management Activities:    - healthy lifestyle promoted - in-home support services arranged - medication-adherence assessment completed - rescue (action) plan reviewed    Notes: 05/12/21 Daughter is primary caregiver.  She is able to describe daily regimen for heart failure and when to call physician. She makes sure patient weighs daily. Weight 193.6 lbs and daughter is recording it.   Heart Failure Management Discussed Please weight daily or as ordered by your doctor Report to your doctor weight gain of 2 -3 pounds in a day or 5 pounds in a week Limit salt intake Monitor for shortness of breath, swelling of feet, ankles or abdomen and weight gain. Never use the saltshaker.  Read all food labels and avoid canned, processed, and pickled foods.   Follow your doctor's recommendations for daily salt intake. 05/24/21 Spoke with daughter Deanna [name]. She reports weights continue daily and last weight was 193 lbs. Reiterated heart failure management.  She voices no concerns.        Goals Addressed               This Visit's Progress     [place] My Quality of Life (pt-stated)   On track     Barriers: Knowledge Timeframe:  Short-Term Goal Priority:  High Start Date:    05/12/21                         Expected End Date:07/07/21                       Follow Up Date 06/07/21   - check out options for in-home help, long-term care or hospice  Patient to participate in home health ordered post hospitalization   Why is this important?   Having a long-term illness can be scary.  It can also be stressful for you and your caregiver.  These steps may help.    Notes: 05/12/21 Daughter cares for patient 24/7.  Home health has been ordered with encouragement to participate to maintain current function. Daughter will follow up with in home resources as finances allow.   05/24/21 Daughter reports that  patient is doing ok.  She states she has [facility] coming for evaluation for help in the home. Mentioned the [facility] program as well but she declined that right now. Home health continues at this time.       Track and Manage Symptoms-Heart Failure   On track     Barriers: Health Behaviors Knowledge Timeframe:  Long-Range Goal Priority:  High Start Date:     05/12/21                        Expected End Date: 02/04/22  Follow Up Date 07/07/21   - eat more whole grains, fruits and vegetables, lean meats and healthy fats - follow rescue plan if symptoms flare-up - know when to call the doctor    Why is this important?   You will be able to handle your symptoms better if you keep track of them.  Making some simple changes to your lifestyle will help.  Eating healthy is one thing you can do to take good care of yourself.    Notes: 05/12/21 Daughter makes sure patient weighs daily. Today's weight 193.6 lbs.  Daughter able to describe regimen and actions for changes in heart failure.   Heart Failure Management Discussed Please weight daily or as ordered by your doctor Report to your doctor weight gain of 2 -3 pounds in a day or 5 pounds in a week Limit salt intake Monitor for shortness of breath, swelling of feet, ankles or abdomen and weight gain. Never use the saltshaker.  Read all food labels and avoid canned, processed, and pickled foods.   Follow your doctor's recommendations for daily salt intake 05/24/21 Patient heart failure management continues.  Reminded caregiver about heart failure management. Daily weights continue. Last weight 193 lbs.          Plan:  Follow-up: Patient agrees to Care Plan and Follow-up. Follow-up in 2-3 week(s)  [name], RN, MSN [place] [facility] Care Management Coordinator Direct Line [phone] Cell [phone] Toll Free: [phone]  Fax: [phone]

## 2021-05-24 NOTE — Patient Instructions (Signed)
Goals Addressed               This Visit's Progress     Matintain My Quality of Life (pt-stated)   On track     Barriers: Knowledge Timeframe:  Short-Term Goal Priority:  High Start Date:    05/12/21                         Expected End Date:07/07/21                       Follow Up Date 06/07/21   - check out options for in-home help, long-term care or hospice  Patient to participate in home health ordered post hospitalization   Why is this important?   Having a long-term illness can be scary.  It can also be stressful for you and your caregiver.  These steps may help.    Notes: 05/12/21 Daughter cares for patient 24/7.  Home health has been ordered with encouragement to participate to maintain current function. Daughter will follow up with in home resources as finances allow.   05/24/21 Daughter reports that patient is doing ok.  She states she has [facility] coming for evaluation for help in the home. Mentioned the PACE program as well but she declined that right now. Home health continues at this time.       Track and Manage Symptoms-Heart Failure   On track     Barriers: Health Behaviors Knowledge Timeframe:  Long-Range Goal Priority:  High Start Date:     05/12/21                        Expected End Date: 02/04/22  Follow Up Date 07/07/21   - eat more whole grains, fruits and vegetables, lean meats and healthy fats - follow rescue plan if symptoms flare-up - know when to call the doctor    Why is this important?   You will be able to handle your symptoms better if you keep track of them.  Making some simple changes to your lifestyle will help.  Eating healthy is one thing you can do to take good care of yourself.    Notes: 05/12/21 Daughter makes sure patient weighs daily. Today's weight 193.6 lbs.  Daughter able to describe regimen and actions for changes in heart failure.   Heart Failure Management Discussed Please weight daily or as ordered by your  doctor Report to your doctor weight gain of 2 -3 pounds in a day or 5 pounds in a week Limit salt intake Monitor for shortness of breath, swelling of feet, ankles or abdomen and weight gain. Never use the saltshaker.  Read all food labels and avoid canned, processed, and pickled foods.   Follow your doctor's recommendations for daily salt intake 05/24/21 Patient heart failure management continues.  Reminded caregiver about heart failure management. Daily weights continue. Last weight 193 lbs.

## 2021-05-25 ENCOUNTER — Other Ambulatory Visit: Payer: Self-pay

## 2021-05-25 DIAGNOSIS — F411 Generalized anxiety disorder: Secondary | ICD-10-CM

## 2021-05-26 ENCOUNTER — Telehealth: Payer: Self-pay | Admitting: Psychiatry

## 2021-05-26 ENCOUNTER — Encounter: Payer: Self-pay | Admitting: Nurse Practitioner

## 2021-05-26 ENCOUNTER — Ambulatory Visit: Payer: Medicare HMO | Admitting: Psychiatry

## 2021-05-26 ENCOUNTER — Ambulatory Visit: Payer: Self-pay

## 2021-05-26 ENCOUNTER — Other Ambulatory Visit: Payer: Self-pay

## 2021-05-26 DIAGNOSIS — E039 Hypothyroidism, unspecified: Secondary | ICD-10-CM

## 2021-05-26 MED ORDER — LEVOTHYROXINE SODIUM 175 MCG PO TABS: 175.0000 ug | ORAL_TABLET | Freq: Every day | ORAL | 1 refills | Status: DC

## 2021-05-26 NOTE — Telephone Encounter (Signed)
Pharmacy said they had her medication ready for it, that it was being covered by insurance. Called [place] (dtr) and let her know.

## 2021-05-26 NOTE — Telephone Encounter (Signed)
Please call pharmacy and find out why they can't fill it and let me know.It may need a PA

## 2021-05-26 NOTE — Telephone Encounter (Signed)
Patient's daughter(Deanna [name]) called regarding Deanna [name]'s prescription for Lexapro 20mg . States that pharmacy is telling her it can't be picked up due to something with insurance. She would like to [place] send new prescription because Deanna [name] can't be without it because she gets hostile when she doesn't have it. Appt 10/25. Ph:[phone]. Pharmacy [facility] [place]

## 2021-05-27 DIAGNOSIS — J9601 Acute respiratory failure with hypoxia: Secondary | ICD-10-CM | POA: Diagnosis not present

## 2021-05-27 DIAGNOSIS — I5033 Acute on chronic diastolic (congestive) heart failure: Secondary | ICD-10-CM | POA: Diagnosis not present

## 2021-05-27 DIAGNOSIS — I480 Paroxysmal atrial fibrillation: Secondary | ICD-10-CM | POA: Diagnosis not present

## 2021-05-27 DIAGNOSIS — I739 Peripheral vascular disease, unspecified: Secondary | ICD-10-CM | POA: Diagnosis not present

## 2021-05-27 DIAGNOSIS — I2510 Atherosclerotic heart disease of native coronary artery without angina pectoris: Secondary | ICD-10-CM | POA: Diagnosis not present

## 2021-05-27 DIAGNOSIS — N1832 Chronic kidney disease, stage 3b: Secondary | ICD-10-CM | POA: Diagnosis not present

## 2021-05-27 DIAGNOSIS — J209 Acute bronchitis, unspecified: Secondary | ICD-10-CM | POA: Diagnosis not present

## 2021-05-27 DIAGNOSIS — I130 Hypertensive heart and chronic kidney disease with heart failure and stage 1 through stage 4 chronic kidney disease, or unspecified chronic kidney disease: Secondary | ICD-10-CM | POA: Diagnosis not present

## 2021-05-27 DIAGNOSIS — I214 Non-ST elevation (NSTEMI) myocardial infarction: Secondary | ICD-10-CM | POA: Diagnosis not present

## 2021-06-01 ENCOUNTER — Ambulatory Visit: Payer: Medicare HMO | Admitting: Psychiatry

## 2021-06-01 DIAGNOSIS — I214 Non-ST elevation (NSTEMI) myocardial infarction: Secondary | ICD-10-CM | POA: Diagnosis not present

## 2021-06-01 DIAGNOSIS — N1832 Chronic kidney disease, stage 3b: Secondary | ICD-10-CM | POA: Diagnosis not present

## 2021-06-01 DIAGNOSIS — J209 Acute bronchitis, unspecified: Secondary | ICD-10-CM | POA: Diagnosis not present

## 2021-06-01 DIAGNOSIS — I2510 Atherosclerotic heart disease of native coronary artery without angina pectoris: Secondary | ICD-10-CM | POA: Diagnosis not present

## 2021-06-01 DIAGNOSIS — I5033 Acute on chronic diastolic (congestive) heart failure: Secondary | ICD-10-CM | POA: Diagnosis not present

## 2021-06-01 DIAGNOSIS — J9601 Acute respiratory failure with hypoxia: Secondary | ICD-10-CM | POA: Diagnosis not present

## 2021-06-01 DIAGNOSIS — I739 Peripheral vascular disease, unspecified: Secondary | ICD-10-CM | POA: Diagnosis not present

## 2021-06-01 DIAGNOSIS — I130 Hypertensive heart and chronic kidney disease with heart failure and stage 1 through stage 4 chronic kidney disease, or unspecified chronic kidney disease: Secondary | ICD-10-CM | POA: Diagnosis not present

## 2021-06-01 DIAGNOSIS — I480 Paroxysmal atrial fibrillation: Secondary | ICD-10-CM | POA: Diagnosis not present

## 2021-06-01 NOTE — Progress Notes (Signed)
Cardiology Office Note   Date:  06/02/2021   ID:  Deanna [name], DOB Jul 22, 1941, MRN [id number]  PCP:  Lauree Chandler, NP  Cardiologist:  Quay Burow, MD EP: None  Chief Complaint  Patient presents with   Hospitalization Follow-up    Recent URI; f/u CAD/CHF/PAF      History of Present Illness: Deanna [name] is a 80 y.o. female with PMH of CAD (MI with PTCA to LAD 1994, PTCA to [place], eventual CABG 1999 with known graft closure 2/4 and subsequent PCIDES to pLAD 03/2020), chronic combined CHF/ICM, paroxysmal atrial fibrillation, PAD with L-R femfem crossover, also L subclavian artery stenosis with L common carotid to subclavian artery bypass, celiac artery stenosis, cognitive impairment, TBI, depression, anxiety, hearing loss, GERD, HTN, hypothyroidism, HLD, OSA (not using CPAP), incontinence, and CKD IV, who presents for post hospital follow-up.  She was recently admitted to the hospital from 05/09/2021 - 05/11/2021 for acute respiratory failure in the setting of recent URI and bronchitis.  Cardiology evaluated patient that admission though symptoms felt to be noncardiac in nature.  She had elevated Hstrop which peaked at 375 felt to be demand ischemia.  She had an echocardiogram that admission which showed EF 50-55% (improved from 35-40% 03/2020), + RWMA (unchanged from previous), mild concentric LVH, and mild MR.  She presents today for hospital follow-up with her daughter Deanna [name].  Overall she has been doing fairly well from a cardiac standpoint since discharge.  She has been weaned down to 1L O2 via .  Hopeful to be able to wean off O2 completely.  She did note an episode of back pain yesterday which she attributes to her recliner being uncomfortable.  Of note, prior anginal equivalent has been back pain in the past.  She reports her pain yesterday did not feel similar to previous angina.  She has had no complaints of chest pain, palpitations, orthopnea, PND, lower extremity  edema, dizziness, lightheadedness, syncope.  Her daughter mentions that she has had increased confusion, word finding difficulty, and memory trouble since discharge.  The patient is largely unaware of these issues though it is worth noting that she did repeat similar phrases/questions throughout our evaluation today.   Past Medical History:  Diagnosis Date   Anxiety    Bacteremia, escherichia coli 04/27/2015   Brachial-basilar insufficiency syndrome    CAD (coronary artery disease)    Celiac artery stenosis (HCC)    Chronic bilateral low back pain without sciatica    Chronic combined systolic (congestive) and diastolic (congestive) heart failure ([place]) 11/14/2017   CKD (chronic kidney disease), stage IV (HCC)    Cognitive impairment    Colon polyps    Community acquired pneumonia    Depression    Diverticulosis    Dysuria    Encephalomalacia    GAD (generalized anxiety disorder)    GERD (gastroesophageal reflux disease)    Glaucoma    Hearing loss    Hemorrhoids    Hypertension    Hypothyroidism    Incontinence    Mixed hyperlipidemia    Myocardial infarction (HCC)    OSA on CPAP    PAF (paroxysmal atrial fibrillation) ([place])    Peripheral arterial disease ([place])    Pneumonitis 04/26/2015   Pyelonephritis due to Escherichia coli 04/28/2015   Sepsis ([place])    Spondylosis of lumbar region without myelopathy or radiculopathy    TBI (traumatic brain injury)    Urticaria    Vertigo, benign positional  Past Surgical History:  Procedure Laterality Date   ABDOMINAL AORTOGRAM W/LOWER EXTREMITY N/A 08/22/2017   Procedure: ABDOMINAL AORTOGRAM W/LOWER EXTREMITY;  Surgeon: Serafina Mitchell, MD;  Location: [facility];  Service: Cardiovascular;  Laterality: N/A;   BILATERAL UPPER EXTREMITY ANGIOGRAM N/A 09/11/2012   Procedure: BILATERAL UPPER EXTREMITY ANGIOGRAM;  Surgeon: Serafina Mitchell, MD;  Location: [facility] CATH LAB;  Service: Cardiovascular;  Laterality: N/A;   BIOPSY  03/25/2020    Procedure: BIOPSY;  Surgeon: Irving Copas., MD;  Location: [facility];  Service: Gastroenterology;;   carotid duplex doppler Bilateral 09/03/2012, 11/03/2011   Evidence of 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 patent left common carotid- subclavian artery bypass with turbulent flow noted at the anastomosis with velocities of 295 cm/s   CAROTID-SUBCLAVIAN BYPASS GRAFT  12/15/2011   Procedure: BYPASS GRAFT CAROTID-SUBCLAVIAN;  Surgeon: Serafina Mitchell, MD;  Location: [facility] OR;  Service: Vascular;  Laterality: Left;  Left Carotid subclavian bypass   CORONARY ANGIOPLASTY     CORONARY ARTERY BYPASS GRAFT     CORONARY ATHERECTOMY N/A 03/19/2020   Procedure: CORONARY ATHERECTOMY;  Surgeon: Jettie Booze, MD;  Location: [facility];  Service: Cardiovascular;  Laterality: N/A;   CORONARY STENT INTERVENTION N/A 03/19/2020   Procedure: CORONARY STENT INTERVENTION;  Surgeon: Jettie Booze, MD;  Location: [facility];  Service: Cardiovascular;  Laterality: N/A;   DOPPLER ECHOCARDIOGRAPHY  05/27/2010, 09/17/2008   Mild Proximal septal thickening is noted. Left ventricular systolic functions is normal ejection fraction =>55%. the aortic valve appears to be mildly sclerotic    ESOPHAGOGASTRODUODENOSCOPY (EGD) WITH PROPOFOL N/A 03/25/2020   Procedure: ESOPHAGOGASTRODUODENOSCOPY (EGD) WITH PROPOFOL;  Surgeon: Rush Landmark Telford Nab., MD;  Location: [facility];  Service: Gastroenterology;  Laterality: N/A;   fem-fem bypass graft  1999   holter monitor  01/21/2008   The predominant rhythm was normal sinus rhythm. Minimum heartrate of 63 bpm at +01:00, maximum heartrate of 105 bpm at + 10:35; and the average heartrate of 75 bpm. Ventricular ectopic activity totaled 1270: Multifocal; 866-PVC's and 404-VEs              JOINT REPLACEMENT      Left knee   LEFT HEART CATH AND CORS/GRAFTS ANGIOGRAPHY N/A 03/19/2020   Procedure: LEFT HEART CATH AND CORS/GRAFTS ANGIOGRAPHY;  Surgeon: Jettie Booze, MD;  Location: [facility];  Service: Cardiovascular;  Laterality: N/A;   LEFT HEART CATHETERIZATION WITH CORONARY/GRAFT ANGIOGRAM N/A 12/21/2011   Procedure: LEFT HEART CATHETERIZATION WITH Beatrix Fetters;  Surgeon: Leonie Man, MD;  Location: [facility] CATH LAB;  Service: Cardiovascular;  Laterality: N/A;   NM MYOCAR PERF EJECTION FRACTION  09/22/2009, 07/03/2007   the post stress myocardial perfusion images show a normal pattern of perfusion is all regions. The post-stress ejection fraction is 68 %. no significant wall motion abnormalities noted. This is a low risk scan.   PERIPHERAL VASCULAR INTERVENTION  08/22/2017   Procedure: PERIPHERAL VASCULAR INTERVENTION;  Surgeon: Serafina Mitchell, MD;  Location: [facility];  Service: Cardiovascular;;  Fem/Fem Graft   REPLACEMENT TOTAL KNEE  05-2011   UNILATERAL UPPER EXTREMEITY ANGIOGRAM Left 11/15/2011   Procedure: UNILATERAL UPPER Anselmo Rod;  Surgeon: Lorretta Harp, MD;  Location[facility] C[facility] LAB;  Service: Cardiovascular;  Laterality: Left;     Current Outpatient Medications  Medication Sig Dispense Refill   acetaminophen (  TYLENOL) 325 MG tablet Take 325 mg by mouth in the morning and at bedtime. 1 by mouth in the morning and 1 by mouth in the pm.     ALPRAZolam (XANAX) 0.5 MG tablet Take 1 tablet (0.5 mg total) by mouth daily as needed for anxiety. 30 tablet 0   aspirin EC 81 MG tablet Take 81 mg by mouth at bedtime.      cholecalciferol (VITAMIN D) 400 units TABS tablet Take 400 Units by mouth daily.     clopidogrel (PLAVIX) 75 MG tablet TAKE 1 TABLET(75 MG) BY MOUTH DAILY 90 tablet 1   divalproex (DEPAKOTE ER) 250 MG 24 hr tablet Take 1 tablet (250 mg total) by mouth at bedtime. 90 tablet 1   dorzolamide (TRUSOPT) 2 % ophthalmic solution Place 1 drop into  both eyes 2 (two) times daily.     escitalopram (LEXAPRO) 20 MG tablet Take 1 tablet (20 mg total) by mouth daily. 90 tablet 1   FARXIGA 10 MG TABS tablet TAKE 1 TABLET(10 MG) BY MOUTH DAILY 30 tablet 5   furosemide (LASIX) 40 MG tablet TAKE 1 TABLET(40 MG) BY MOUTH DAILY 90 tablet 1   gabapentin (NEURONTIN) 100 MG capsule TAKE 2 CAPSULES(200 MG) BY MOUTH DAILY 60 capsule 5   latanoprost (XALATAN) 0.005 % ophthalmic solution Place 1 drop into both eyes at bedtime.     levothyroxine (SYNTHROID) 175 MCG tablet Take 1 tablet (175 mcg total) by mouth daily. Take at the same time daily separate from other medications 90 tablet 1   meclizine (ANTIVERT) 25 MG tablet Take 1 tablet (25 mg total) by mouth as needed for dizziness. 30 tablet 0   metoprolol tartrate (LOPRESSOR) 25 MG tablet Take 12.5 mg by mouth 2 (two) times daily.     nitroGLYCERIN (NITROSTAT) 0.4 MG SL tablet Place 1 tablet (0.4 mg total) under the tongue every 5 (five) minutes x 3 doses as needed for chest pain. 25 tablet 1   pantoprazole (PROTONIX) 40 MG tablet TAKE 1 TABLET EVERY DAY 90 tablet 1   rosuvastatin (CRESTOR) 40 MG tablet TAKE 1 TABLET(40 MG) BY MOUTH DAILY 90 tablet 3   tiZANidine (ZANAFLEX) 2 MG tablet Take 1 tablet (2 mg total) by mouth every 6 (six) hours as needed for muscle spasms. 30 tablet 5   valsartan (DIOVAN) 80 MG tablet TAKE 1 TABLET EVERY MORNING 90 tablet 1   vitamin B-12 (CYANOCOBALAMIN) 1000 MCG tablet Take 1,000 mcg by mouth daily.     No current facility-administered medications for this visit.    Allergies:   Donepezil, Duloxetine hcl, Meloxicam, Memantine, Oxycodone, and Vicodin [hydrocodone-acetaminophen]    Social History:  The patient  reports that she quit smoking about 39 years ago. Her smoking use included cigarettes. She has never used smokeless tobacco. She reports that she does not currently use alcohol. She reports that she does not use drugs.   Family History:  The patient's family history  includes Anxiety disorder in her daughter and sister; Anxiety disorder (age of onset: 60) in her sister; Cerebral palsy (age of onset: 74) in her sister; Colon cancer (age of onset: 2) in her brother; Congestive Heart Failure (age of onset: 3) in her sister; Dementia in her sister; Depression in her daughter; Diabetes in her father; Heart Problems in her sister; Heart Problems (age of onset: 71) in her sister; Heart attack in her mother; Heart attack (age of onset: 101) in her father; Heart attack (age of onset: 39) in  her brother and sister; Heart attack (age of onset: 44) in her sister; Heart disease in her father and mother; Hyperlipidemia in her father; Hypertension in her daughter and father; Hypertension (age of onset: 97) in her sister; Irritable bowel syndrome in her daughter; Prostate cancer (age of onset: 33) in her brother; Stroke in her sister.    ROS:  Please see the history of present illness.   Otherwise, review of systems are positive for none.   All other systems are reviewed and negative.    PHYSICAL EXAM: VS:  BP 114/67   Pulse 90   Ht 5\' 1"  (1.549 m)   Wt 191 lb (86.6 kg)   SpO2 97%   BMI 36.09 kg/m  , BMI Body mass index is 36.09 kg/m. GEN: Well nourished, well developed, in no acute distress HEENT: sclera anicteric Neck: no JVD, carotid bruits, or masses Cardiac: RRR; no murmurs, rubs, or gallops, no edema  Respiratory:  clear to auscultation bilaterally, normal work of breathing GI: soft, obese, nontender, nondistended, + BS MS: no deformity or atrophy Skin: warm and dry, no rash Neuro:  Strength and sensation are intact Psych: euthymic mood, full affect   EKG:  EKG is ordered today. The ekg ordered today demonstrates sinus rhythm, rate 90 bpm, non-specific ST-T wave abnormalities, no significant change from previous.    Recent Labs: 04/07/2021: TSH 25.54 05/09/2021: B Natriuretic Peptide 611.0; Magnesium 1.8 05/20/2021: ALT 8; BUN 38; Creat 1.52; Hemoglobin  12.5; Platelets 165; Potassium 4.0; Sodium 138    Lipid Panel    Component Value Date/Time   CHOL 110 05/10/2021 0142   TRIG 160 (H) 05/10/2021 0142   HDL 34 (L) 05/10/2021 0142   CHOLHDL 3.2 05/10/2021 0142   VLDL 32 05/10/2021 0142   LDLCALC 44 05/10/2021 0142   LDLCALC 70 09/02/2020 1440      Wt Readings from Last 3 Encounters:  06/02/21 191 lb (86.6 kg)  05/20/21 195 lb 6.4 oz (88.6 kg)  05/09/21 185 lb (83.9 kg)      Other studies Reviewed: Additional studies/ records that were reviewed today include:   Echocardiogram 05/09/21: 1. Left ventricular ejection fraction, by estimation, is 50 to 55%. The  left ventricle has low normal function. The left ventricle demonstrates  regional wall motion abnormalities (see scoring diagram/findings for  description). There is mild concentric  left ventricular hypertrophy. Left ventricular diastolic parameters are  indeterminate. Elevated left ventricular end-diastolic pressure. There is  akinesis of the left ventricular, apical septal wall, inferior wall and  inferolateral wall. There is  akinesis of the left ventricular, entire apical segment. There is  hypokinesis of the left ventricular, mid-apical anteroseptal wall.   2. Right ventricular systolic function is normal. The right ventricular  size is normal.   3. The mitral valve is normal in structure. Mild mitral valve  regurgitation. No evidence of mitral stenosis. Moderate mitral annular  calcification.   4. The aortic valve is tricuspid. Aortic valve regurgitation is not  visualized. Mild aortic valve sclerosis is present, with no evidence of  aortic valve stenosis.   5. The inferior vena cava is normal in size with greater than 50%  respiratory variability, suggesting right atrial pressure of 3 mmHg.   Left heart catheterization 03/19/20: Ost LAD to Prox LAD lesion is 99% stenosed. LIMA to LAD known to be atretic. After orbital atherectomy, A drug-eluting stent was  successfully placed using a STENT RESOLUTE ONYX 3.5X15. Post intervention, there is a 0% residual stenosis.  Mid LAD lesion is 100% stenosed. Despite atherectomy and angioplasty, flow could not be restored. Mid Cx lesion is 100% stenosed. SVG to OM patent. Prox RCA lesion is 100% stenosed. SVG to PDA occluded. There is moderate left ventricular systolic dysfunction. LV end diastolic pressure is moderately elevated. The left ventricular ejection fraction is 25-35% by visual estimate. There is no aortic valve stenosis. Balloon angioplasty was performed using a BALLOON SAPPHIRE 2.0X12.   Complex intervention due to severe three-vessel coronary artery disease with prior bypass surgery.  Successful atherectomy and stenting of the proximal LAD.  Unable to restore flow through the mid LAD.  I suspect, she is a somewhat late presenting infarct.  She already has evidence of heart failure with elevated LVEDP.  Will need ongoing medical therapy to help her LV dysfunction.     Echocardiogram 03/19/20: 1. Left ventricular ejection fraction, by estimation, is 35 to 40%. The  left ventricle has moderately decreased function. The left ventricle  demonstrates regional wall motion abnormalities (see scoring  diagram/findings for description). Left ventricular   diastolic parameters are consistent with Grade II diastolic dysfunction  (pseudonormalization). There is severe akinesis of the left ventricular,  mid-apical anteroseptal wall.   2. Right ventricular systolic function is mildly reduced. The right  ventricular size is normal.   3. The mitral valve is grossly normal. Mild mitral valve regurgitation.   4. The aortic valve is normal in structure. Aortic valve regurgitation is  not visualized. No aortic stenosis is present.     ASSESSMENT AND PLAN:   1. CAD s/p multiple MI/PCI s/p CABG in 1999 with subsequent PCI/DES to pLAD 03/2020:  - Continue aspirin and plavix  2. Chronic combined CHF: EF  improved to 50-55% 05/09/21 from 35-40% 03/2020. She appears euvolemic today - Continue metoprolol tartrate, valsartan, and furosemide - Continue farxiga - Encouraged low salt diet and monitoring daily weights  3. Paroxysmal atrial fibrillation: was apparently a brief episode in 2021, therefore anticoagulation wasn't recommended. In sinus rhythm today - Continue metoprolol for rate control - Continue to monitor for recurrence  4. PAD: s/p L-R femfem crossover,  L common carotid to subclavian artery bypass, and celiac artery stenosis - Continue aspirin, plavix, and statin  5. HTN: BP 114/67 today - Continue management in the context of #2  6. HLD: LDL 44 05/10/21 - Continue crestor  7. CKD stage III-IV: Cr 44 05/10/21 - Continue close monitoring and follow-up with nephrology  8.  Memory trouble: Daughter reports increased confusion, word finding difficulty, and memory trouble since recent discharge.  She has no formal diagnosis of dementia. - Will place referral to neurology for further evaluation   Current medicines are reviewed at length with the patient today.  The patient does not have concerns regarding medicines.  The following changes have been made:  As above  Labs/ tests ordered today include:   Orders Placed This Encounter  Procedures   Ambulatory referral to Neurology   EKG 12-Lead      Disposition:   FU with Dr. Gwenlyn Found in 3 months  Signed, Abigail Butts, PA-C  06/02/2021 9:13 AM

## 2021-06-02 ENCOUNTER — Other Ambulatory Visit: Payer: Self-pay

## 2021-06-02 ENCOUNTER — Ambulatory Visit: Payer: Medicare HMO | Admitting: Medical

## 2021-06-02 ENCOUNTER — Encounter: Payer: Self-pay | Admitting: Medical

## 2021-06-02 VITALS — BP 114/67 | HR 90 | Ht 61.0 in | Wt 191.0 lb

## 2021-06-02 DIAGNOSIS — I255 Ischemic cardiomyopathy: Secondary | ICD-10-CM

## 2021-06-02 DIAGNOSIS — I480 Paroxysmal atrial fibrillation: Secondary | ICD-10-CM | POA: Diagnosis not present

## 2021-06-02 DIAGNOSIS — I2510 Atherosclerotic heart disease of native coronary artery without angina pectoris: Secondary | ICD-10-CM

## 2021-06-02 DIAGNOSIS — I739 Peripheral vascular disease, unspecified: Secondary | ICD-10-CM | POA: Diagnosis not present

## 2021-06-02 DIAGNOSIS — E782 Mixed hyperlipidemia: Secondary | ICD-10-CM

## 2021-06-02 DIAGNOSIS — I10 Essential (primary) hypertension: Secondary | ICD-10-CM

## 2021-06-02 DIAGNOSIS — R413 Other amnesia: Secondary | ICD-10-CM

## 2021-06-02 DIAGNOSIS — J209 Acute bronchitis, unspecified: Secondary | ICD-10-CM | POA: Diagnosis not present

## 2021-06-02 DIAGNOSIS — I5042 Chronic combined systolic (congestive) and diastolic (congestive) heart failure: Secondary | ICD-10-CM

## 2021-06-02 DIAGNOSIS — I130 Hypertensive heart and chronic kidney disease with heart failure and stage 1 through stage 4 chronic kidney disease, or unspecified chronic kidney disease: Secondary | ICD-10-CM | POA: Diagnosis not present

## 2021-06-02 DIAGNOSIS — N1832 Chronic kidney disease, stage 3b: Secondary | ICD-10-CM | POA: Diagnosis not present

## 2021-06-02 DIAGNOSIS — J9601 Acute respiratory failure with hypoxia: Secondary | ICD-10-CM | POA: Diagnosis not present

## 2021-06-02 DIAGNOSIS — I5033 Acute on chronic diastolic (congestive) heart failure: Secondary | ICD-10-CM | POA: Diagnosis not present

## 2021-06-02 DIAGNOSIS — I214 Non-ST elevation (NSTEMI) myocardial infarction: Secondary | ICD-10-CM | POA: Diagnosis not present

## 2021-06-02 NOTE — Patient Instructions (Signed)
Medication Instructions:  Continue current medications  *If you need a refill on your cardiac medications before your next appointment, please call your pharmacy*   Lab Work: None Ordered   Testing/Procedures: None Ordered   Follow-Up: At [facility], you and your health needs are our priority.  As part of our continuing mission to provide you with exceptional heart care, we have created designated Provider Care Teams.  These Care Teams include your primary Cardiologist (physician) and Advanced Practice Providers (APPs -  Physician Assistants and Nurse Practitioners) who all work together to provide you with the care you need, when you need it.  We recommend signing up for the patient portal called "MyChart".  Sign up information is provided on this After Visit Summary.  MyChart is used to connect with patients for Virtual Visits (Telemedicine).  Patients are able to view lab/test results, encounter notes, upcoming appointments, etc.  Non-urgent messages can be sent to your provider as well.   To learn more about what you can do with MyChart, go to [email or web address].    Your next appointment:   3 month(s)  The format for your next appointment:   In Person  Provider:   You may see Quay Burow, MD or one of the following Advanced Practice Providers on your designated Care Team:   [place], PA-C Coletta Memos, FNP   Other Instructions You have been referred to Neurology

## 2021-06-03 DIAGNOSIS — I480 Paroxysmal atrial fibrillation: Secondary | ICD-10-CM | POA: Diagnosis not present

## 2021-06-03 DIAGNOSIS — J9601 Acute respiratory failure with hypoxia: Secondary | ICD-10-CM | POA: Diagnosis not present

## 2021-06-03 DIAGNOSIS — I739 Peripheral vascular disease, unspecified: Secondary | ICD-10-CM | POA: Diagnosis not present

## 2021-06-03 DIAGNOSIS — I5033 Acute on chronic diastolic (congestive) heart failure: Secondary | ICD-10-CM | POA: Diagnosis not present

## 2021-06-03 DIAGNOSIS — I130 Hypertensive heart and chronic kidney disease with heart failure and stage 1 through stage 4 chronic kidney disease, or unspecified chronic kidney disease: Secondary | ICD-10-CM | POA: Diagnosis not present

## 2021-06-03 DIAGNOSIS — J209 Acute bronchitis, unspecified: Secondary | ICD-10-CM | POA: Diagnosis not present

## 2021-06-03 DIAGNOSIS — I214 Non-ST elevation (NSTEMI) myocardial infarction: Secondary | ICD-10-CM | POA: Diagnosis not present

## 2021-06-03 DIAGNOSIS — I2510 Atherosclerotic heart disease of native coronary artery without angina pectoris: Secondary | ICD-10-CM | POA: Diagnosis not present

## 2021-06-03 DIAGNOSIS — N1832 Chronic kidney disease, stage 3b: Secondary | ICD-10-CM | POA: Diagnosis not present

## 2021-06-04 DIAGNOSIS — J209 Acute bronchitis, unspecified: Secondary | ICD-10-CM | POA: Diagnosis not present

## 2021-06-04 DIAGNOSIS — I739 Peripheral vascular disease, unspecified: Secondary | ICD-10-CM | POA: Diagnosis not present

## 2021-06-04 DIAGNOSIS — I130 Hypertensive heart and chronic kidney disease with heart failure and stage 1 through stage 4 chronic kidney disease, or unspecified chronic kidney disease: Secondary | ICD-10-CM | POA: Diagnosis not present

## 2021-06-04 DIAGNOSIS — I2510 Atherosclerotic heart disease of native coronary artery without angina pectoris: Secondary | ICD-10-CM | POA: Diagnosis not present

## 2021-06-04 DIAGNOSIS — I5033 Acute on chronic diastolic (congestive) heart failure: Secondary | ICD-10-CM | POA: Diagnosis not present

## 2021-06-04 DIAGNOSIS — I214 Non-ST elevation (NSTEMI) myocardial infarction: Secondary | ICD-10-CM | POA: Diagnosis not present

## 2021-06-04 DIAGNOSIS — N1832 Chronic kidney disease, stage 3b: Secondary | ICD-10-CM | POA: Diagnosis not present

## 2021-06-04 DIAGNOSIS — I480 Paroxysmal atrial fibrillation: Secondary | ICD-10-CM | POA: Diagnosis not present

## 2021-06-04 DIAGNOSIS — J9601 Acute respiratory failure with hypoxia: Secondary | ICD-10-CM | POA: Diagnosis not present

## 2021-06-08 DIAGNOSIS — J9601 Acute respiratory failure with hypoxia: Secondary | ICD-10-CM | POA: Diagnosis not present

## 2021-06-08 DIAGNOSIS — N1832 Chronic kidney disease, stage 3b: Secondary | ICD-10-CM | POA: Diagnosis not present

## 2021-06-08 DIAGNOSIS — J209 Acute bronchitis, unspecified: Secondary | ICD-10-CM | POA: Diagnosis not present

## 2021-06-08 DIAGNOSIS — I214 Non-ST elevation (NSTEMI) myocardial infarction: Secondary | ICD-10-CM | POA: Diagnosis not present

## 2021-06-08 DIAGNOSIS — I739 Peripheral vascular disease, unspecified: Secondary | ICD-10-CM | POA: Diagnosis not present

## 2021-06-08 DIAGNOSIS — I2510 Atherosclerotic heart disease of native coronary artery without angina pectoris: Secondary | ICD-10-CM | POA: Diagnosis not present

## 2021-06-08 DIAGNOSIS — I5033 Acute on chronic diastolic (congestive) heart failure: Secondary | ICD-10-CM | POA: Diagnosis not present

## 2021-06-08 DIAGNOSIS — I130 Hypertensive heart and chronic kidney disease with heart failure and stage 1 through stage 4 chronic kidney disease, or unspecified chronic kidney disease: Secondary | ICD-10-CM | POA: Diagnosis not present

## 2021-06-08 DIAGNOSIS — I480 Paroxysmal atrial fibrillation: Secondary | ICD-10-CM | POA: Diagnosis not present

## 2021-06-10 ENCOUNTER — Ambulatory Visit: Payer: Medicare HMO | Admitting: Physician Assistant

## 2021-06-10 DIAGNOSIS — I2510 Atherosclerotic heart disease of native coronary artery without angina pectoris: Secondary | ICD-10-CM | POA: Diagnosis not present

## 2021-06-10 DIAGNOSIS — J9601 Acute respiratory failure with hypoxia: Secondary | ICD-10-CM | POA: Diagnosis not present

## 2021-06-10 DIAGNOSIS — I480 Paroxysmal atrial fibrillation: Secondary | ICD-10-CM | POA: Diagnosis not present

## 2021-06-10 DIAGNOSIS — I130 Hypertensive heart and chronic kidney disease with heart failure and stage 1 through stage 4 chronic kidney disease, or unspecified chronic kidney disease: Secondary | ICD-10-CM | POA: Diagnosis not present

## 2021-06-10 DIAGNOSIS — I739 Peripheral vascular disease, unspecified: Secondary | ICD-10-CM | POA: Diagnosis not present

## 2021-06-10 DIAGNOSIS — I5033 Acute on chronic diastolic (congestive) heart failure: Secondary | ICD-10-CM | POA: Diagnosis not present

## 2021-06-10 DIAGNOSIS — J209 Acute bronchitis, unspecified: Secondary | ICD-10-CM | POA: Diagnosis not present

## 2021-06-10 DIAGNOSIS — N1832 Chronic kidney disease, stage 3b: Secondary | ICD-10-CM | POA: Diagnosis not present

## 2021-06-10 DIAGNOSIS — I214 Non-ST elevation (NSTEMI) myocardial infarction: Secondary | ICD-10-CM | POA: Diagnosis not present

## 2021-06-11 DIAGNOSIS — I739 Peripheral vascular disease, unspecified: Secondary | ICD-10-CM | POA: Diagnosis not present

## 2021-06-11 DIAGNOSIS — N1832 Chronic kidney disease, stage 3b: Secondary | ICD-10-CM | POA: Diagnosis not present

## 2021-06-11 DIAGNOSIS — J209 Acute bronchitis, unspecified: Secondary | ICD-10-CM | POA: Diagnosis not present

## 2021-06-11 DIAGNOSIS — I130 Hypertensive heart and chronic kidney disease with heart failure and stage 1 through stage 4 chronic kidney disease, or unspecified chronic kidney disease: Secondary | ICD-10-CM | POA: Diagnosis not present

## 2021-06-11 DIAGNOSIS — I214 Non-ST elevation (NSTEMI) myocardial infarction: Secondary | ICD-10-CM | POA: Diagnosis not present

## 2021-06-11 DIAGNOSIS — I2510 Atherosclerotic heart disease of native coronary artery without angina pectoris: Secondary | ICD-10-CM | POA: Diagnosis not present

## 2021-06-11 DIAGNOSIS — I5033 Acute on chronic diastolic (congestive) heart failure: Secondary | ICD-10-CM | POA: Diagnosis not present

## 2021-06-11 DIAGNOSIS — J9601 Acute respiratory failure with hypoxia: Secondary | ICD-10-CM | POA: Diagnosis not present

## 2021-06-11 DIAGNOSIS — I480 Paroxysmal atrial fibrillation: Secondary | ICD-10-CM | POA: Diagnosis not present

## 2021-06-12 DIAGNOSIS — A4151 Sepsis due to Escherichia coli [E. coli]: Secondary | ICD-10-CM | POA: Diagnosis not present

## 2021-06-12 DIAGNOSIS — R269 Unspecified abnormalities of gait and mobility: Secondary | ICD-10-CM | POA: Diagnosis not present

## 2021-06-12 DIAGNOSIS — G4733 Obstructive sleep apnea (adult) (pediatric): Secondary | ICD-10-CM | POA: Diagnosis not present

## 2021-06-12 DIAGNOSIS — J9601 Acute respiratory failure with hypoxia: Secondary | ICD-10-CM | POA: Diagnosis not present

## 2021-06-12 DIAGNOSIS — J9691 Respiratory failure, unspecified with hypoxia: Secondary | ICD-10-CM | POA: Diagnosis not present

## 2021-06-14 ENCOUNTER — Other Ambulatory Visit: Payer: Self-pay

## 2021-06-14 DIAGNOSIS — I214 Non-ST elevation (NSTEMI) myocardial infarction: Secondary | ICD-10-CM | POA: Diagnosis not present

## 2021-06-14 DIAGNOSIS — J209 Acute bronchitis, unspecified: Secondary | ICD-10-CM | POA: Diagnosis not present

## 2021-06-14 DIAGNOSIS — I5033 Acute on chronic diastolic (congestive) heart failure: Secondary | ICD-10-CM | POA: Diagnosis not present

## 2021-06-14 DIAGNOSIS — I480 Paroxysmal atrial fibrillation: Secondary | ICD-10-CM | POA: Diagnosis not present

## 2021-06-14 DIAGNOSIS — I2510 Atherosclerotic heart disease of native coronary artery without angina pectoris: Secondary | ICD-10-CM | POA: Diagnosis not present

## 2021-06-14 DIAGNOSIS — I130 Hypertensive heart and chronic kidney disease with heart failure and stage 1 through stage 4 chronic kidney disease, or unspecified chronic kidney disease: Secondary | ICD-10-CM | POA: Diagnosis not present

## 2021-06-14 DIAGNOSIS — N1832 Chronic kidney disease, stage 3b: Secondary | ICD-10-CM | POA: Diagnosis not present

## 2021-06-14 DIAGNOSIS — I739 Peripheral vascular disease, unspecified: Secondary | ICD-10-CM | POA: Diagnosis not present

## 2021-06-14 DIAGNOSIS — J9601 Acute respiratory failure with hypoxia: Secondary | ICD-10-CM | POA: Diagnosis not present

## 2021-06-14 NOTE — Patient Outreach (Signed)
Deanna [name]) Care Management  [facility]  06/14/2021   Deanna [name] 05-15-1941 [id number]  Subjective: Telephone call to daughter [name]. Patient doing ok.  Heart Failure management continues. Patient has appointment this week with neurologist.  No concerns.   Objective:   Encounter Medications:  Outpatient Encounter Medications as of 06/14/2021  Medication Sig   acetaminophen (TYLENOL) 325 MG tablet Take 325 mg by mouth in the morning and at bedtime. 1 by mouth in the morning and 1 by mouth in the pm.   ALPRAZolam (XANAX) 0.5 MG tablet Take 1 tablet (0.5 mg total) by mouth daily as needed for anxiety.   aspirin EC 81 MG tablet Take 81 mg by mouth at bedtime.    cholecalciferol (VITAMIN D) 400 units TABS tablet Take 400 Units by mouth daily.   clopidogrel (PLAVIX) 75 MG tablet TAKE 1 TABLET(75 MG) BY MOUTH DAILY   divalproex (DEPAKOTE ER) 250 MG 24 hr tablet Take 1 tablet (250 mg total) by mouth at bedtime.   dorzolamide (TRUSOPT) 2 % ophthalmic solution Place 1 drop into both eyes 2 (two) times daily.   escitalopram (LEXAPRO) 20 MG tablet Take 1 tablet (20 mg total) by mouth daily.   FARXIGA 10 MG TABS tablet TAKE 1 TABLET(10 MG) BY MOUTH DAILY   furosemide (LASIX) 40 MG tablet TAKE 1 TABLET(40 MG) BY MOUTH DAILY   gabapentin (NEURONTIN) 100 MG capsule TAKE 2 CAPSULES(200 MG) BY MOUTH DAILY   latanoprost (XALATAN) 0.005 % ophthalmic solution Place 1 drop into both eyes at bedtime.   levothyroxine (SYNTHROID) 175 MCG tablet Take 1 tablet (175 mcg total) by mouth daily. Take at the same time daily separate from other medications   meclizine (ANTIVERT) 25 MG tablet Take 1 tablet (25 mg total) by mouth as needed for dizziness.   metoprolol tartrate (LOPRESSOR) 25 MG tablet Take 12.5 mg by mouth 2 (two) times daily.   nitroGLYCERIN (NITROSTAT) 0.4 MG SL tablet Place 1 tablet (0.4 mg total) under the tongue every 5 (five) minutes x 3 doses as needed for chest  pain.   pantoprazole (PROTONIX) 40 MG tablet TAKE 1 TABLET EVERY DAY   rosuvastatin (CRESTOR) 40 MG tablet TAKE 1 TABLET(40 MG) BY MOUTH DAILY   tiZANidine (ZANAFLEX) 2 MG tablet Take 1 tablet (2 mg total) by mouth every 6 (six) hours as needed for muscle spasms.   valsartan (DIOVAN) 80 MG tablet TAKE 1 TABLET EVERY MORNING   vitamin B-12 (CYANOCOBALAMIN) 1000 MCG tablet Take 1,000 mcg by mouth daily.   No facility-administered encounter medications on file as of 06/14/2021.    Functional Status:  In your present state of health, do you have any difficulty performing the following activities: 05/12/2021 05/09/2021  Hearing? Y Y  Comment hearing loss -  Vision? Y N  Comment glaucoma -  Difficulty concentrating or making decisions? N N  Walking or climbing stairs? Tempie Donning  Comment assistance of 1 person -  Dressing or bathing? N Y  Comment daughter does -  Doing errands, shopping? N Y  Comment daughter does daughter does  Conservation officer, nature and eating ? Y -  Comment daughter does -  Using the Toilet? Y -  Comment daughter helps -  In the past six months, have you accidently leaked urine? Y -  Comment incontinent -  Do you have problems with loss of bowel control? N -  Managing your Medications? Y -  Comment daughter does -  Managing your Finances?  Y -  Comment daughter does -  Runner, broadcasting/film/video? Y -  Comment duaghter does -  Some recent data might be hidden    Fall/Depression Screening: Fall Risk  05/20/2021 05/12/2021 05/12/2021  Falls in the past year? 1 1 1   Number falls in past yr: 1 1 0  Injury with Fall? 0 0 0  Risk for fall due to : History of fall(s) - History of fall(s)  Follow up Falls evaluation completed;Education provided;Falls prevention discussed - Education provided   [facility] 2/9 Scores 05/12/2021 04/15/2021 09/02/2020 05/06/2020 04/08/2019 11/29/2017 11/17/2015  PHQ - 2 Score - 0 - 0 - 6 1  PHQ- 9 Score - - - - - 18 -  Exception Documentation Other-  indicate reason in comment box - Other- indicate reason in comment box - Other- indicate reason in comment box - -  Not completed Daughter finishes assessment - Seeing psych - Already under the care [facility] - -    Assessment:   Care Plan Care Plan : General Plan of Care (Adult)  Updates made by Jon Billings, RN since 06/14/2021 12:00 AM     Problem: Therapeutic Alliance (General Plan of Care)      Goal: Therapeutic Alliance Established as evidenced by engagement with care manager Completed 06/14/2021  Start Date: 05/12/2021  Expected End Date: 07/07/2021  Recent Progress: On track  Priority: High  Note:   Evidence-based guidance:  Avoid value judgments; convey acceptance.  Encourage collaboration with the treatment team.  Establish rapport; develop trust relationship.  Therapist, art.  Provide emotional support; encourage patient to share feelings of anger, fear and anxiety.  Promote self-reliance and autonomy based on age and ability; discourage overprotection.  Use empathy and nonjudgmental, participatory manner.   Notes:     Task: Develop Relationship to Effect Behavior Change Completed 06/14/2021  Due Date: 07/07/2021  Priority: Routine  Responsible User: Jon Billings, RN  Note:   Care Management Activities:    - acceptance conveyed - care explained - collaboration with team encouraged - emotional support provided - questions encouraged - reassurance provided    Notes: 05/12/21 Daughter is the only caregiver for patient. No other family.  [name] not formally employed.  However, she will possibly want to hire help if she sees they can afford it as patient does not qualify for medicaid. She will explore resources if able.  She is in agreement to work with CM for education and support. 05/24/21 daughter continues to engage with CM.  She is primary caregiver for her mother.  She reports she has something lined up for [facility] for in home care for evaluation.      Care Plan : Heart Failure (Adult)  Updates made by Jon Billings, RN since 06/14/2021 12:00 AM     Problem: Symptom Exacerbation (Heart Failure)      Long-Range Goal: Symptom Exacerbation Prevented or Minimized as evidenced by no acute exacerbations of heart failure   Start Date: 05/12/2021  Expected End Date: 02/04/2022  This Visit's Progress: On track  Priority: High  Note:   Evidence-based guidance:  Perform or review cognitive and/or health literacy screening.  Assess understanding of adherence and barriers to treatment plan, as well as lifestyle changes; develop strategies to address barriers.  Establish a mutually-agreed-upon early intervention process to communicate with primary care provider when signs/symptoms worsen.  Facilitate timely posthospital discharge or emergency department treatment that includes intensive follow-up via telephone calls, home visit, telehealth monitoring and care at multidisciplinary  heart failure clinic.  Adjust frequency and intensity of follow-up based on presentation, number of emergency department visits, hospital admissions and frequency and severity of symptom exacerbation.  Facilitate timely visit, usually within 1 week, with primary care provider following hospital discharge.  Collaborate with clinical pharmacist to address adverse drug reactions, drug interactions, subtherapeutic dosage, patient and family education.  Regularly screen for presence of depressive symptoms using a validated tool; consider pharmacologic therapy and/or referral for cognitive behavioral therapy when present.  Refer to community-based services, such as a heart failure support group, community Economist or peer support program.  Review immunization status; arrange receipt of needed vaccinations.  Prepare patient for home oxygen use based on signs/symptoms.   Notes:     Task: Identify and Minimize Risk of Heart Failure Exacerbation   Due Date: 02/04/2022   Priority: Routine  Responsible User: Jon Billings, RN  Note:   Care Management Activities:    - healthy lifestyle promoted - rescue (action) plan reviewed    Notes: 05/12/21 Daughter is primary caregiver.  She is able to describe daily regimen for heart failure and when to call physician. She makes sure patient weighs daily. Weight 193.6 lbs and daughter is recording it.   Heart Failure Management Discussed Please weight daily or as ordered by your doctor Report to your doctor weight gain of 2 -3 pounds in a day or 5 pounds in a week Limit salt intake Monitor for shortness of breath, swelling of feet, ankles or abdomen and weight gain. Never use the saltshaker.  Read all food labels and avoid canned, processed, and pickled foods.   Follow your doctor's recommendations for daily salt intake. 05/24/21 Spoke with daughter [name]. She reports weights continue daily and last weight was 193 lbs. Reiterated heart failure management.  She voices no concerns.   06/14/21 Spoke with daughter [name]. Patient weight now 186 lbs.  Reviewed heart failure management.         Goals Addressed               This Visit's Progress     COMPLETED: [place] My Quality of Life (pt-stated)   On track     Barriers: Knowledge Timeframe:  Short-Term Goal Priority:  High Start Date:    05/12/21                         Expected End Date:07/07/21                       Follow Up Date 07/07/21    Patient to participate in home health ordered post hospitalization   Why is this important?   Having a long-term illness can be scary.  It can also be stressful for you and your caregiver.  These steps may help.    Notes: 05/12/21 Daughter cares for patient 24/7.  Home health has been ordered with encouragement to participate to maintain current function. Daughter will follow up with in home resources as finances allow.   05/24/21 Daughter reports that patient is doing ok.  She states she has [facility]  coming for evaluation for help in the home. Mentioned the [facility] program as well but she declined that right now. Home health continues at this time.  06/14/21 Patient has home care now on Fridays while daughter goes out.        Track and Manage Symptoms-Heart Failure   On track     Barriers: Health Behaviors  Knowledge Timeframe:  Long-Range Goal Priority:  High Start Date:     05/12/21                        Expected End Date: 02/04/22  Follow Up Date 07/07/21   - eat more whole grains, fruits and vegetables, lean meats and healthy fats - follow rescue plan if symptoms flare-up - know when to call the doctor    Why is this important?   You will be able to handle your symptoms better if you keep track of them.  Making some simple changes to your lifestyle will help.  Eating healthy is one thing you can do to take good care of yourself.    Notes: 05/12/21 Daughter makes sure patient weighs daily. Today's weight 193.6 lbs.  Daughter able to describe regimen and actions for changes in heart failure.   Heart Failure Management Discussed Please weight daily or as ordered by your doctor Report to your doctor weight gain of 2 -3 pounds in a day or 5 pounds in a week Limit salt intake Monitor for shortness of breath, swelling of feet, ankles or abdomen and weight gain. Never use the saltshaker.  Read all food labels and avoid canned, processed, and pickled foods.   Follow your doctor's recommendations for daily salt intake 05/24/21 Patient heart failure management continues.  Reminded caregiver about heart failure management. Daily weights continue. Last weight 193 lbs.   06/14/21 Daughter continues with heart failure management.  Weight 186 lbs.  Patient appetite recently not as good. Discussed heart failure management and strategies to keep calorie intake up.          Plan:  Follow-up: Patient agrees to Care Plan and Follow-up. Follow-up in 3-4 week(s)  [name], RN, MSN [facility]  Management Care Management Coordinator Direct Line [phone] Cell [phone] Toll Free: [phone]  Fax: [phone]

## 2021-06-15 ENCOUNTER — Encounter: Payer: Self-pay | Admitting: Physician Assistant

## 2021-06-15 ENCOUNTER — Ambulatory Visit: Payer: Medicare HMO | Admitting: Physician Assistant

## 2021-06-15 ENCOUNTER — Other Ambulatory Visit: Payer: Self-pay

## 2021-06-15 VITALS — BP 92/58 | HR 67 | Resp 18 | Ht 61.0 in | Wt 191.0 lb

## 2021-06-15 DIAGNOSIS — G3184 Mild cognitive impairment, so stated: Secondary | ICD-10-CM | POA: Diagnosis not present

## 2021-06-15 NOTE — Patient Instructions (Addendum)
It was a pleasure to see you today at our office.   Recommendations:  Meds: Follow up in  1 year Please contact Dr. Anthoney Harada for assessment of decision mental capacity [phone] You can also call [facility] [phone] for guidance on geriatric dementia issues  Follow with Psychiatry Follow with PCP on the thyroid issues    RECOMMENDATIONS FOR ALL PATIENTS WITH MEMORY PROBLEMS: 1. Continue to exercise (Recommend 30 minutes of walking everyday, or 3 hours every week) 2. Increase social interactions - continue going to [place] and enjoy social gatherings with friends and family 3. Eat healthy, avoid fried foods and eat more fruits and vegetables 4. Maintain adequate blood pressure, blood sugar, and blood cholesterol level. Reducing the risk of stroke and cardiovascular disease also helps promoting better memory. 5. Avoid stressful situations. Live a simple life and avoid aggravations. Organize your time and prepare for the next day in anticipation. 6. Sleep well, avoid any interruptions of sleep and avoid any distractions in the bedroom that may interfere with adequate sleep quality 7. Avoid sugar, avoid sweets as there is a strong link between excessive sugar intake, diabetes, and cognitive impairment We discussed the Mediterranean diet, which has been shown to help patients reduce the risk of progressive memory disorders and reduces cardiovascular risk. This includes eating fish, eat fruits and green leafy vegetables, nuts like almonds and hazelnuts, walnuts, and also use olive oil. Avoid fast foods and fried foods as much as possible. Avoid sweets and sugar as sugar use has been linked to worsening of memory function.  There is always a concern of gradual progression of memory problems. If this is the case, then we may need to adjust level of care according to patient needs. Support, both to the patient and caregiver, should then be put into place.    The Alzheimer's  Association is here all day, every day for people facing Alzheimer's disease through our free 24/7 Helpline: [phone]. The Helpline provides reliable information and support to all those who need assistance, such as individuals living with memory loss, Alzheimer's or other dementia, caregivers, health care professionals and the public.  Our highly trained and knowledgeable staff can help you with: Understanding memory loss, dementia and Alzheimer's  Medications and other treatment options  General information about aging and brain health  Skills to provide quality care and to find the best care from professionals  Legal, financial and living-arrangement decisions Our Helpline also features: Confidential care consultation provided by master's level clinicians who can help with decision-making support, crisis assistance and education on issues families face every day  Help in a caller's preferred language using our translation service that features more than 200 languages and dialects  Referrals to local community programs, services and ongoing support     FALL PRECAUTIONS: Be cautious when walking. Scan the area for obstacles that may increase the risk of trips and falls. When getting up in the mornings, sit up at the edge of the bed for a few minutes before getting out of bed. Consider elevating the bed at the head end to avoid drop of blood pressure when getting up. Walk always in a well-lit room (use night lights in the walls). Avoid area rugs or power cords from appliances in the middle of the walkways. Use a walker or a cane if necessary and consider physical therapy for balance exercise. Get your eyesight checked regularly.  FINANCIAL OVERSIGHT: Supervision, especially oversight when making financial decisions or transactions is also recommended.  HOME SAFETY: Consider the safety of the kitchen when operating appliances like stoves, microwave oven, and blender. Consider having supervision  and share cooking responsibilities until no longer able to participate in those. Accidents with firearms and other hazards in the house should be identified and addressed as well.   ABILITY TO BE LEFT ALONE: If patient is unable to contact 911 operator, consider using LifeLine, or when the need is there, arrange for someone to stay with patients. Smoking is a fire hazard, consider supervision or cessation. Risk of wandering should be assessed by caregiver and if detected at any point, supervision and safe proof recommendations should be instituted.  MEDICATION SUPERVISION: Inability to self-administer medication needs to be constantly addressed. Implement a mechanism to ensure safe administration of the medications.   DRIVING: Regarding driving, in patients with progressive memory problems, driving will be impaired. We advise to have someone else do the driving if trouble finding directions or if minor accidents are reported. Independent driving assessment is available to determine safety of driving.   If you are interested in the driving assessment, you can contact the following:  [employer] in [place]  [place] [phone] or [phone]      [place] refers to food and lifestyle choices that are based on the traditions of countries located on the [employer]. This way of eating has been shown to help prevent certain conditions and improve outcomes for people who have chronic diseases, like kidney disease and heart disease. What are tips for following this plan? Lifestyle  Cook and eat meals together with your family, when possible. Drink enough fluid to keep your urine clear or pale yellow. Be physically active every day. This includes: Aerobic exercise like running or swimming. Leisure activities like gardening, walking, or  housework. Get 7-8 hours of sleep each night. If recommended by your health care provider, drink red wine in moderation. This means 1 glass a day for nonpregnant women and 2 glasses a day for men. A glass of wine equals 5 oz (150 mL). Reading food labels  Check the serving size of packaged foods. For foods such as rice and pasta, the serving size refers to the amount of cooked product, not dry. Check the total fat in packaged foods. Avoid foods that have saturated fat or trans fats. Check the ingredients list for added sugars, such as corn syrup. Shopping  At the grocery store, buy most of your food from the areas near the walls of the store. This includes: Fresh fruits and vegetables (produce). Grains, beans, nuts, and seeds. Some of these may be available in unpackaged forms or large amounts (in bulk). Fresh seafood. Poultry and eggs. Low-fat dairy products. Buy whole ingredients instead of prepackaged foods. Buy fresh fruits and vegetables in-season from local farmers markets. Buy frozen fruits and vegetables in resealable bags. If you do not have access to quality fresh seafood, buy precooked frozen shrimp or canned fish, such as tuna, salmon, or sardines. Buy small amounts of raw or cooked vegetables, salads, or olives from the deli or salad bar at your store. Stock your pantry so you always have certain foods on hand, such as olive oil, canned tuna, canned tomatoes, rice, pasta, and beans. Cooking  Cook foods with extra-virgin olive oil instead of using butter or other vegetable oils. Have meat as a side dish, and have vegetables or grains as your main dish.  This means having meat in small portions or adding small amounts of meat to foods like pasta or stew. Use beans or vegetables instead of meat in common dishes like chili or lasagna. Experiment with different cooking methods. Try roasting or broiling vegetables instead of steaming or sauteing them. Add frozen vegetables to soups,  stews, pasta, or rice. Add nuts or seeds for added healthy fat at each meal. You can add these to yogurt, salads, or vegetable dishes. Marinate fish or vegetables using olive oil, lemon juice, garlic, and fresh herbs. Meal planning  Plan to eat 1 vegetarian meal one day each week. Try to work up to 2 vegetarian meals, if possible. Eat seafood 2 or more times a week. Have healthy snacks readily available, such as: Vegetable sticks with hummus. Greek yogurt. Fruit and nut trail mix. Eat balanced meals throughout the week. This includes: Fruit: 2-3 servings a day Vegetables: 4-5 servings a day Low-fat dairy: 2 servings a day Fish, poultry, or lean meat: 1 serving a day Beans and legumes: 2 or more servings a week Nuts and seeds: 1-2 servings a day Whole grains: 6-8 servings a day Extra-virgin olive oil: 3-4 servings a day Limit red meat and sweets to only a few servings a month What are my food choices? Mediterranean diet Recommended Grains: Whole-grain pasta. Brown rice. Bulgar wheat. Polenta. Couscous. Whole-wheat bread. [place]. Vegetables: Artichokes. Beets. Broccoli. Cabbage. Carrots. Eggplant. Green beans. Chard. Kale. Spinach. Onions. Leeks. Peas. Squash. Tomatoes. Peppers. Radishes. Fruits: Apples. Apricots. Avocado. Berries. Bananas. Cherries. Dates. Figs. Grapes. Lemons. Melon. Oranges. Peaches. Plums. Pomegranate. Meats and other protein foods: Beans. Almonds. Sunflower seeds. Pine nuts. Peanuts. [place]. Salmon. Scallops. Shrimp. [place]. Tilapia. Clams. Oysters. Eggs. Dairy: Low-fat milk. Cheese. Greek yogurt. Beverages: Water. Red wine. Herbal tea. Fats and oils: Extra virgin olive oil. Avocado oil. Grape seed oil. Sweets and desserts: [place] yogurt with honey. Baked apples. Poached pears. Trail mix. Seasoning and other foods: Basil. Cilantro. Coriander. Cumin. Mint. Parsley. Sage. Rosemary. Tarragon. Garlic. Oregano. Thyme. Pepper. Balsalmic vinegar. Tahini. Hummus. Tomato  sauce. Olives. Mushrooms. Limit these Grains: Prepackaged pasta or rice dishes. Prepackaged cereal with added sugar. Vegetables: Deep fried potatoes (french fries). Fruits: Fruit canned in syrup. Meats and other protein foods: Beef. Pork. Lamb. Poultry with skin. Hot dogs. [place] Salines. Dairy: Ice cream. Sour cream. Whole milk. Beverages: Juice. Sugar-sweetened soft drinks. Beer. Liquor and spirits. Fats and oils: Butter. Canola oil. Vegetable oil. Beef fat (tallow). Lard. Sweets and desserts: Cookies. Cakes. Pies. Candy. Seasoning and other foods: Mayonnaise. Premade sauces and marinades. The items listed may not be a complete list. Talk with your dietitian about what dietary choices are right for you. Summary The Mediterranean diet includes both food and lifestyle choices. Eat a variety of fresh fruits and vegetables, beans, nuts, seeds, and whole grains. Limit the amount of red meat and sweets that you eat. Talk with your health care provider about whether it is safe for you to drink red wine in moderation. This means 1 glass a day for nonpregnant women and 2 glasses a day for men. A glass of wine equals 5 oz (150 mL). This information is not intended to replace advice given to you by your health care provider. Make sure you discuss any questions you have with your health care provider. Document Released: 03/17/2016 Document Revised: 04/19/2016 Document Reviewed: 03/17/2016 Elsevier Interactive Patient Education  2017 [employer].
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
## 2021-06-16 ENCOUNTER — Other Ambulatory Visit: Payer: Self-pay

## 2021-06-16 DIAGNOSIS — I480 Paroxysmal atrial fibrillation: Secondary | ICD-10-CM | POA: Diagnosis not present

## 2021-06-16 DIAGNOSIS — I5033 Acute on chronic diastolic (congestive) heart failure: Secondary | ICD-10-CM | POA: Diagnosis not present

## 2021-06-16 DIAGNOSIS — I130 Hypertensive heart and chronic kidney disease with heart failure and stage 1 through stage 4 chronic kidney disease, or unspecified chronic kidney disease: Secondary | ICD-10-CM | POA: Diagnosis not present

## 2021-06-16 DIAGNOSIS — I739 Peripheral vascular disease, unspecified: Secondary | ICD-10-CM | POA: Diagnosis not present

## 2021-06-16 DIAGNOSIS — E039 Hypothyroidism, unspecified: Secondary | ICD-10-CM

## 2021-06-16 DIAGNOSIS — J209 Acute bronchitis, unspecified: Secondary | ICD-10-CM | POA: Diagnosis not present

## 2021-06-16 DIAGNOSIS — I2510 Atherosclerotic heart disease of native coronary artery without angina pectoris: Secondary | ICD-10-CM | POA: Diagnosis not present

## 2021-06-16 DIAGNOSIS — J9601 Acute respiratory failure with hypoxia: Secondary | ICD-10-CM | POA: Diagnosis not present

## 2021-06-16 DIAGNOSIS — I214 Non-ST elevation (NSTEMI) myocardial infarction: Secondary | ICD-10-CM | POA: Diagnosis not present

## 2021-06-16 DIAGNOSIS — N1832 Chronic kidney disease, stage 3b: Secondary | ICD-10-CM | POA: Diagnosis not present

## 2021-06-16 MED ORDER — LEVOTHYROXINE SODIUM 175 MCG PO TABS
175.0000 ug | ORAL_TABLET | Freq: Every day | ORAL | 1 refills | Status: DC
Start: 2021-06-16 — End: 2021-06-16

## 2021-06-16 MED ORDER — LEVOTHYROXINE SODIUM 175 MCG PO TABS: 175.0000 ug | ORAL_TABLET | Freq: Every day | ORAL | 1 refills | Status: DC

## 2021-06-23 DIAGNOSIS — N1832 Chronic kidney disease, stage 3b: Secondary | ICD-10-CM | POA: Diagnosis not present

## 2021-06-23 DIAGNOSIS — I214 Non-ST elevation (NSTEMI) myocardial infarction: Secondary | ICD-10-CM | POA: Diagnosis not present

## 2021-06-23 DIAGNOSIS — J209 Acute bronchitis, unspecified: Secondary | ICD-10-CM | POA: Diagnosis not present

## 2021-06-23 DIAGNOSIS — I2510 Atherosclerotic heart disease of native coronary artery without angina pectoris: Secondary | ICD-10-CM | POA: Diagnosis not present

## 2021-06-23 DIAGNOSIS — I130 Hypertensive heart and chronic kidney disease with heart failure and stage 1 through stage 4 chronic kidney disease, or unspecified chronic kidney disease: Secondary | ICD-10-CM | POA: Diagnosis not present

## 2021-06-23 DIAGNOSIS — J9601 Acute respiratory failure with hypoxia: Secondary | ICD-10-CM | POA: Diagnosis not present

## 2021-06-23 DIAGNOSIS — I5033 Acute on chronic diastolic (congestive) heart failure: Secondary | ICD-10-CM | POA: Diagnosis not present

## 2021-06-23 DIAGNOSIS — I480 Paroxysmal atrial fibrillation: Secondary | ICD-10-CM | POA: Diagnosis not present

## 2021-06-23 DIAGNOSIS — I739 Peripheral vascular disease, unspecified: Secondary | ICD-10-CM | POA: Diagnosis not present

## 2021-06-24 ENCOUNTER — Telehealth: Payer: Self-pay

## 2021-06-24 DIAGNOSIS — N189 Chronic kidney disease, unspecified: Secondary | ICD-10-CM | POA: Diagnosis not present

## 2021-06-24 DIAGNOSIS — N2581 Secondary hyperparathyroidism of renal origin: Secondary | ICD-10-CM | POA: Diagnosis not present

## 2021-06-24 DIAGNOSIS — D631 Anemia in chronic kidney disease: Secondary | ICD-10-CM | POA: Diagnosis not present

## 2021-06-24 DIAGNOSIS — I2510 Atherosclerotic heart disease of native coronary artery without angina pectoris: Secondary | ICD-10-CM | POA: Diagnosis not present

## 2021-06-24 DIAGNOSIS — E039 Hypothyroidism, unspecified: Secondary | ICD-10-CM | POA: Diagnosis not present

## 2021-06-24 DIAGNOSIS — I129 Hypertensive chronic kidney disease with stage 1 through stage 4 chronic kidney disease, or unspecified chronic kidney disease: Secondary | ICD-10-CM | POA: Diagnosis not present

## 2021-06-24 DIAGNOSIS — N184 Chronic kidney disease, stage 4 (severe): Secondary | ICD-10-CM | POA: Diagnosis not present

## 2021-06-24 DIAGNOSIS — G8929 Other chronic pain: Secondary | ICD-10-CM

## 2021-06-24 DIAGNOSIS — M5441 Lumbago with sciatica, right side: Secondary | ICD-10-CM

## 2021-06-24 DIAGNOSIS — I509 Heart failure, unspecified: Secondary | ICD-10-CM | POA: Diagnosis not present

## 2021-06-24 LAB — COMPREHENSIVE METABOLIC PANEL
Albumin: 4.5 (ref 3.5–5.0)
Calcium: 9.5 (ref 8.7–10.7)

## 2021-06-24 LAB — BASIC METABOLIC PANEL
BUN: 27 — AB (ref 4–21)
CO2: 27 — AB (ref 13–22)
Chloride: 95 — AB (ref 99–108)
Creatinine: 2 — AB (ref 0.5–1.1)
Glucose: 146
Potassium: 3.6 (ref 3.4–5.3)
Sodium: 143 (ref 137–147)

## 2021-06-24 LAB — CBC AND DIFFERENTIAL: Hemoglobin: 12.9 (ref 12.0–16.0)

## 2021-06-24 NOTE — Telephone Encounter (Signed)
Gracie with [facility] left a message on voicemail requesting order for standard wheelchair to be faxed to them at [phone].   Side note: Last visit was 05/20/2021

## 2021-06-25 DIAGNOSIS — I2510 Atherosclerotic heart disease of native coronary artery without angina pectoris: Secondary | ICD-10-CM | POA: Diagnosis not present

## 2021-06-25 DIAGNOSIS — I480 Paroxysmal atrial fibrillation: Secondary | ICD-10-CM | POA: Diagnosis not present

## 2021-06-25 DIAGNOSIS — N1832 Chronic kidney disease, stage 3b: Secondary | ICD-10-CM | POA: Diagnosis not present

## 2021-06-25 DIAGNOSIS — I739 Peripheral vascular disease, unspecified: Secondary | ICD-10-CM | POA: Diagnosis not present

## 2021-06-25 DIAGNOSIS — J9601 Acute respiratory failure with hypoxia: Secondary | ICD-10-CM | POA: Diagnosis not present

## 2021-06-25 DIAGNOSIS — J209 Acute bronchitis, unspecified: Secondary | ICD-10-CM | POA: Diagnosis not present

## 2021-06-25 DIAGNOSIS — I130 Hypertensive heart and chronic kidney disease with heart failure and stage 1 through stage 4 chronic kidney disease, or unspecified chronic kidney disease: Secondary | ICD-10-CM | POA: Diagnosis not present

## 2021-06-25 DIAGNOSIS — I5033 Acute on chronic diastolic (congestive) heart failure: Secondary | ICD-10-CM | POA: Diagnosis not present

## 2021-06-25 DIAGNOSIS — I214 Non-ST elevation (NSTEMI) myocardial infarction: Secondary | ICD-10-CM | POA: Diagnosis not present

## 2021-06-25 NOTE — Telephone Encounter (Signed)
Rx Printed, Signed and faxed to [facility].

## 2021-06-25 NOTE — Telephone Encounter (Signed)
Noted, Rx printed

## 2021-06-29 DIAGNOSIS — I2510 Atherosclerotic heart disease of native coronary artery without angina pectoris: Secondary | ICD-10-CM | POA: Diagnosis not present

## 2021-06-29 DIAGNOSIS — I739 Peripheral vascular disease, unspecified: Secondary | ICD-10-CM | POA: Diagnosis not present

## 2021-06-29 DIAGNOSIS — N1832 Chronic kidney disease, stage 3b: Secondary | ICD-10-CM | POA: Diagnosis not present

## 2021-06-29 DIAGNOSIS — J209 Acute bronchitis, unspecified: Secondary | ICD-10-CM | POA: Diagnosis not present

## 2021-06-29 DIAGNOSIS — I5033 Acute on chronic diastolic (congestive) heart failure: Secondary | ICD-10-CM | POA: Diagnosis not present

## 2021-06-29 DIAGNOSIS — I480 Paroxysmal atrial fibrillation: Secondary | ICD-10-CM | POA: Diagnosis not present

## 2021-06-29 DIAGNOSIS — I130 Hypertensive heart and chronic kidney disease with heart failure and stage 1 through stage 4 chronic kidney disease, or unspecified chronic kidney disease: Secondary | ICD-10-CM | POA: Diagnosis not present

## 2021-06-29 DIAGNOSIS — I214 Non-ST elevation (NSTEMI) myocardial infarction: Secondary | ICD-10-CM | POA: Diagnosis not present

## 2021-06-29 DIAGNOSIS — J9601 Acute respiratory failure with hypoxia: Secondary | ICD-10-CM | POA: Diagnosis not present

## 2021-07-06 DIAGNOSIS — I2510 Atherosclerotic heart disease of native coronary artery without angina pectoris: Secondary | ICD-10-CM | POA: Diagnosis not present

## 2021-07-06 DIAGNOSIS — I5033 Acute on chronic diastolic (congestive) heart failure: Secondary | ICD-10-CM | POA: Diagnosis not present

## 2021-07-06 DIAGNOSIS — J209 Acute bronchitis, unspecified: Secondary | ICD-10-CM | POA: Diagnosis not present

## 2021-07-06 DIAGNOSIS — I130 Hypertensive heart and chronic kidney disease with heart failure and stage 1 through stage 4 chronic kidney disease, or unspecified chronic kidney disease: Secondary | ICD-10-CM | POA: Diagnosis not present

## 2021-07-06 DIAGNOSIS — I739 Peripheral vascular disease, unspecified: Secondary | ICD-10-CM | POA: Diagnosis not present

## 2021-07-06 DIAGNOSIS — N1832 Chronic kidney disease, stage 3b: Secondary | ICD-10-CM | POA: Diagnosis not present

## 2021-07-06 DIAGNOSIS — I480 Paroxysmal atrial fibrillation: Secondary | ICD-10-CM | POA: Diagnosis not present

## 2021-07-06 DIAGNOSIS — I214 Non-ST elevation (NSTEMI) myocardial infarction: Secondary | ICD-10-CM | POA: Diagnosis not present

## 2021-07-06 DIAGNOSIS — J9601 Acute respiratory failure with hypoxia: Secondary | ICD-10-CM | POA: Diagnosis not present

## 2021-07-07 ENCOUNTER — Other Ambulatory Visit: Payer: Self-pay

## 2021-07-07 DIAGNOSIS — N184 Chronic kidney disease, stage 4 (severe): Secondary | ICD-10-CM | POA: Diagnosis not present

## 2021-07-07 NOTE — Patient Outreach (Signed)
[place] [facility]) Care Management  07/07/2021  LEON GOODNOW [birth year] [id number]   Telephone call to daughter [name].  No answer. Unable to leave a message.    Plan: RN CM will attempt again in the month of December.    [name], RN, MSN [place] Management Care Management Coordinator Direct Line [phone] Cell [phone] Toll Free: [phone]  Fax: [phone]

## 2021-07-09 ENCOUNTER — Ambulatory Visit: Payer: Medicare HMO | Admitting: Psychiatry

## 2021-07-12 ENCOUNTER — Other Ambulatory Visit: Payer: Self-pay

## 2021-07-12 NOTE — Patient Outreach (Signed)
[place] [facility]) Care Management  07/12/2021  [name] June 28, 1941 [id number]   Telephone call to patient for follow up.   No answer.  Unable to leave a message.      Plan: If no return call, RN CM will attempt patient again within 4 business days and send a letter.    [name], RN, MSN [place] Management Care Management Coordinator Direct Line [phone] Cell [phone] Toll Free: [phone]  Fax: [phone]

## 2021-07-13 ENCOUNTER — Ambulatory Visit: Payer: Medicare HMO | Admitting: Psychiatry

## 2021-07-13 ENCOUNTER — Encounter: Payer: Self-pay | Admitting: Psychiatry

## 2021-07-13 ENCOUNTER — Other Ambulatory Visit: Payer: Self-pay

## 2021-07-13 DIAGNOSIS — F39 Unspecified mood [affective] disorder: Secondary | ICD-10-CM | POA: Diagnosis not present

## 2021-07-13 DIAGNOSIS — F411 Generalized anxiety disorder: Secondary | ICD-10-CM | POA: Diagnosis not present

## 2021-07-13 MED ORDER — BUSPIRONE HCL 15 MG PO TABS: ORAL_TABLET | ORAL | 2 refills | Status: DC

## 2021-07-13 MED ORDER — ESCITALOPRAM OXALATE 20 MG PO TABS: 20.0000 mg | ORAL_TABLET | Freq: Every day | ORAL | 1 refills | Status: DC

## 2021-07-13 MED ORDER — DIVALPROEX SODIUM ER 250 MG PO TB24: 250.0000 mg | ORAL_TABLET | Freq: Every day | ORAL | 1 refills | Status: DC

## 2021-07-13 MED ORDER — ALPRAZOLAM 0.5 MG PO TABS: 0.5000 mg | ORAL_TABLET | Freq: Every day | ORAL | 2 refills | Status: DC | PRN

## 2021-07-13 NOTE — Progress Notes (Signed)
KALLEY NICHOLL [id number] 04-06-1941 80 y.o.  Subjective:   Patient ID:  Deanna [name] is a 80 y.o. (DOB 1941/02/24) female.  Chief Complaint:  Chief Complaint  Patient presents with   Anxiety   Follow-up    H/o Depression    HPI Deanna [name] presents to the office today for follow-up of anxiety, depression, and insomnia. She is accompanied by her daughter. Daughter reports that she is "doing much better" and was hospitalized for shortness of breath. Daughter reports that pt is very anxious. Daughter reports that pt was severely anxious before coming to apt. Daughter reports that pt is having difficulty falling asleep and sleeps well once she gets to sleep. Daughter reports that pt will have vivid dreams and will wake up thinking dreams were real. Daughter reports that pt was sleeping excessively (ie., 14 hours) for awhile and has changed recently. They report that she recently started waking up earlier again. Pt denies depressed mood. She reports that she is worried about Christmas. She reports, "I do what I can" and that she wants to do more. Daughter has noticed pt's motivation has improved in the last few days. Daughter reports that pt continues to have difficulty with memory. Appetite has been good. Daughter reports that pt has lost some weight. Denies SI.   Has been less sleepy since reduction of Depakote.  Past Psychiatric Medication Trials: Sertraline- initially helpful and then no longer effective Cymbalta- increased blood pressure Prozac-possible adverse reaction Lexapro Depakote Ativan-effective but caused excessive drowsiness Xanax-effective Aricept-anger Namenda- anger Gabapentin- Some improvement in pain and [place] Office Visit from 06/15/2021 in [facility] from 03/07/2018 in [facility] [place] from 05/30/2014 in [facility]  Total Score (max 30 points ) 26 29  29       PHQ2-9    Flowsheet Row Clinical Support from 04/15/2021 in [place] Visit from 05/06/2020 in [place] Visit from 11/29/2017 in [place] Patient Outreach from 11/17/2015 in [place] Patient Outreach from 10/15/2015 in [place]  PHQ-2 Total Score 0 0 6 1 1   PHQ-9 Total Score -- -- 18 -- --      Flowsheet Row ED to Hosp-Admission (Discharged) from 05/09/2021 in [place] No Risk        Review of Systems:  Review of Systems  Genitourinary:        Has been dx'd with Stage IV Renal disease  Musculoskeletal:  Positive for gait problem.  Neurological:        Mild, occ tremor  Psychiatric/Behavioral:         Please refer to HPI   Medications: I have reviewed the patient's current medications.  Current Outpatient Medications  Medication Sig Dispense Refill   acetaminophen (TYLENOL) 325 MG tablet Take 325 mg by mouth in the morning and at bedtime. 1 by mouth in the morning and 1 by mouth in the pm.     aspirin EC 81 MG tablet Take 81 mg by mouth at bedtime.      busPIRone (BUSPAR) 15 MG tablet Take 1/3 tablet p.o. twice daily for 1 week, then take 2/3 tablet p.o. twice daily for 1 week, then take 1 tablet p.o. twice daily 60 tablet 2   cholecalciferol (VITAMIN D) 400 units TABS tablet Take 400 Units by mouth daily.     clopidogrel (PLAVIX)  75 MG tablet TAKE 1 TABLET(75 MG) BY MOUTH DAILY 90 tablet 1   dorzolamide (TRUSOPT) 2 % ophthalmic solution Place 1 drop into both eyes 2 (two) times daily.     FARXIGA 10 MG TABS tablet TAKE 1 TABLET(10 MG) BY MOUTH DAILY 30 tablet 5   furosemide (LASIX) 40 MG tablet TAKE 1 TABLET(40 MG) BY MOUTH DAILY 90 tablet 1   gabapentin (NEURONTIN) 100 MG capsule TAKE 2 CAPSULES(200 MG) BY MOUTH DAILY 60 capsule 5   latanoprost (XALATAN) 0.005 % ophthalmic solution Place 1 drop into both eyes at bedtime.     levothyroxine (SYNTHROID)  175 MCG tablet Take 1 tablet (175 mcg total) by mouth daily. Take at the same time daily separate from other medications 90 tablet 1   meclizine (ANTIVERT) 25 MG tablet Take 1 tablet (25 mg total) by mouth as needed for dizziness. 30 tablet 0   metoprolol tartrate (LOPRESSOR) 25 MG tablet Take 12.5 mg by mouth 2 (two) times daily.     MYRBETRIQ 50 MG TB24 tablet      pantoprazole (PROTONIX) 40 MG tablet TAKE 1 TABLET EVERY DAY 90 tablet 1   rosuvastatin (CRESTOR) 40 MG tablet TAKE 1 TABLET(40 MG) BY MOUTH DAILY 90 tablet 3   tiZANidine (ZANAFLEX) 2 MG tablet Take 1 tablet (2 mg total) by mouth every 6 (six) hours as needed for muscle spasms. 30 tablet 5   vitamin B-12 (CYANOCOBALAMIN) 1000 MCG tablet Take 1,000 mcg by mouth daily.     ALPRAZolam (XANAX) 0.5 MG tablet Take 1 tablet (0.5 mg total) by mouth daily as needed for anxiety. 30 tablet 2   divalproex (DEPAKOTE ER) 250 MG 24 hr tablet Take 1 tablet (250 mg total) by mouth at bedtime. 90 tablet 1   escitalopram (LEXAPRO) 20 MG tablet Take 1 tablet (20 mg total) by mouth daily. 90 tablet 1   nitroGLYCERIN (NITROSTAT) 0.4 MG SL tablet Place 1 tablet (0.4 mg total) under the tongue every 5 (five) minutes x 3 doses as needed for chest pain. 25 tablet 1   valsartan (DIOVAN) 80 MG tablet TAKE 1 TABLET EVERY MORNING (Patient not taking: Reported on 07/13/2021) 90 tablet 1   No current facility-administered medications for this visit.    Medication Side Effects: Other: Possible tremor  Allergies:  Allergies  Allergen Reactions   Donepezil Other (See Comments)    Altered mood, aggression, and caused anger   Duloxetine Hcl     Increased confusion and memory concerns    Meloxicam     Pt suspects that this medicine causes fluid on her lungs.    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    Vicodin [Hydrocodone-Acetaminophen] Other (See  Comments)    Delirium, confusion, and toxic dementia    Past Medical History:  Diagnosis Date   Anxiety    Bacteremia, escherichia coli 04/27/2015   Brachial-basilar insufficiency syndrome    CAD (coronary artery disease)    Celiac artery stenosis (HCC)    Chronic bilateral low back pain without sciatica    Chronic combined systolic (congestive) and diastolic (congestive) heart failure ([place]) 11/14/2017   CKD (chronic kidney disease), stage IV (HCC)    Cognitive impairment    Colon polyps    Community acquired pneumonia    Depression    Diverticulosis    Dysuria    Encephalomalacia  GAD (generalized anxiety disorder)    GERD (gastroesophageal reflux disease)    Glaucoma    Hearing loss    Hemorrhoids    Hypertension    Hypothyroidism    Incontinence    Mixed hyperlipidemia    Myocardial infarction (HCC)    OSA on CPAP    PAF (paroxysmal atrial fibrillation) (HCC)    Peripheral arterial disease ([place])    Pneumonitis 04/26/2015   Pyelonephritis due to Escherichia coli 04/28/2015   Sepsis ([place])    Spondylosis of lumbar region without myelopathy or radiculopathy    TBI (traumatic brain injury)    Urticaria    Vertigo, benign positional     Past Medical History, Surgical history, Social history, and Family history were reviewed and updated as appropriate.   Please see review of systems for further details on the patient's review from today.   Objective:   Physical Exam:  There were no vitals taken for this visit.  Physical Exam Constitutional:      General: She is not in acute distress. Musculoskeletal:        General: No deformity.  Neurological:     Mental Status: She is alert and oriented to person, place, and time.     Coordination: Coordination normal.  Psychiatric:        Attention and Perception: Attention and perception normal. She does not perceive auditory or visual hallucinations.        Mood and Affect: Mood is anxious. Mood is not depressed.  Affect is not labile, blunt, angry or inappropriate.        Speech: Speech normal.        Behavior: Behavior normal.        Thought Content: Thought content normal. Thought content is not paranoid or delusional. Thought content does not include homicidal or suicidal ideation. Thought content does not include homicidal or suicidal plan.        Cognition and Memory: Cognition and memory normal.        Judgment: Judgment normal.     Comments: Insight intact    Lab Review:     Component Value Date/Time   NA 143 06/24/2021 0000   K 3.6 06/24/2021 0000   CL 95 (A) 06/24/2021 0000   CO2 27 (A) 06/24/2021 0000   GLUCOSE 95 05/20/2021 1159   BUN 27 (A) 06/24/2021 0000   CREATININE 2.0 (A) 06/24/2021 0000   CREATININE 1.52 (H) 05/20/2021 1159   CALCIUM 9.5 06/24/2021 0000   PROT 6.3 05/20/2021 1159   ALBUMIN 4.5 06/24/2021 0000   AST 10 05/20/2021 1159   ALT 8 05/20/2021 1159   ALKPHOS 66 05/09/2021 0042   BILITOT 0.7 05/20/2021 1159   GFRNONAA 30 (L) 05/11/2021 0338   GFRNONAA 30 (L) 12/07/2020 1356   GFRAA 35 (L) 12/07/2020 1356       Component Value Date/Time   WBC 7.4 05/20/2021 1159   RBC 4.60 05/20/2021 1159   HGB 12.9 06/24/2021 0000   HGB 10.2 (L) 04/22/2020 1118   HCT 39.4 05/20/2021 1159   HCT 30.6 (L) 04/22/2020 1118   PLT 165 05/20/2021 1159   PLT 108 (L) 04/22/2020 1118   MCV 85.7 05/20/2021 1159   MCV 86 04/22/2020 1118   MCH 27.2 05/20/2021 1159   MCHC 31.7 (L) 05/20/2021 1159   RDW 13.6 05/20/2021 1159   RDW 13.5 04/22/2020 1118   LYMPHSABS 2,575 05/20/2021 1159   MONOABS 0.5 05/09/2021 0042   EOSABS 81 05/20/2021 1159  BASOSABS 59 05/20/2021 1159    No results found for: POCLITH, LITHIUM   No results found for: PHENYTOIN, PHENOBARB, VALPROATE, CBMZ   .res Assessment: Plan:   Pt seen for 25 minutes. Pt and her daughter report that her anxiety is not adequately controlled. Discussed possible treatment options for anxiety.  Discussed potential  benefits, risks, and side effects of BuSpar.  Patient agrees to trial of BuSpar.  Will start BuSpar 15 mg 1/3 tablet twice daily for 1 week, then increase to 2/3 tablet twice daily for 1 week, then increase to 1 tablet twice daily for anxiety. Continue Lexapro 20 mg po qd for anxiety and depression.  Continue Alprazolam 0.5 mg po qd prn anxiety.  Continue Depakote Er 250 mg po QHS for mood s/s.  Pt to follow-up in 3 months or sooner if clinically indicated.  Patient advised to contact office with any questions, adverse effects, or acute worsening in signs and symptoms.   Deanna [name] was seen today for anxiety and follow-up.  Diagnoses and all orders for this visit:  Generalized anxiety disorder -     ALPRAZolam (XANAX) 0.5 MG tablet; Take 1 tablet (0.5 mg total) by mouth daily as needed for anxiety. -     escitalopram (LEXAPRO) 20 MG tablet; Take 1 tablet (20 mg total) by mouth daily. -     busPIRone (BUSPAR) 15 MG tablet; Take 1/3 tablet p.o. twice daily for 1 week, then take 2/3 tablet p.o. twice daily for 1 week, then take 1 tablet p.o. twice daily  Mood disorder (HCC) -     divalproex (DEPAKOTE ER) 250 MG 24 hr tablet; Take 1 tablet (250 mg total) by mouth at bedtime. -     escitalopram (LEXAPRO) 20 MG tablet; Take 1 tablet (20 mg total) by mouth daily.    Please see After Visit Summary for patient specific instructions.  Future Appointments  Date Time Provider [place]  07/15/2021 10:00 AM Jon Billings, RN [facility] None  08/31/2021  3:10 PM Deberah Pelton, NP [facility]  10/11/2021  2:45 PM Lauree Chandler, NP [facility] None  10/12/2021  2:00 PM Thayer Headings, PMHNP CP-[facility] None  06/15/2022 11:30 AM Shawn Route, Coralee Pesa, PA-C [facility] None    No orders of the defined types were placed in this encounter.   -------------------------------

## 2021-07-15 ENCOUNTER — Other Ambulatory Visit: Payer: Self-pay

## 2021-07-15 DIAGNOSIS — J9601 Acute respiratory failure with hypoxia: Secondary | ICD-10-CM | POA: Diagnosis not present

## 2021-07-15 DIAGNOSIS — I480 Paroxysmal atrial fibrillation: Secondary | ICD-10-CM | POA: Diagnosis not present

## 2021-07-15 DIAGNOSIS — J209 Acute bronchitis, unspecified: Secondary | ICD-10-CM | POA: Diagnosis not present

## 2021-07-15 DIAGNOSIS — N1832 Chronic kidney disease, stage 3b: Secondary | ICD-10-CM | POA: Diagnosis not present

## 2021-07-15 DIAGNOSIS — I214 Non-ST elevation (NSTEMI) myocardial infarction: Secondary | ICD-10-CM | POA: Diagnosis not present

## 2021-07-15 DIAGNOSIS — I5033 Acute on chronic diastolic (congestive) heart failure: Secondary | ICD-10-CM | POA: Diagnosis not present

## 2021-07-15 DIAGNOSIS — I2510 Atherosclerotic heart disease of native coronary artery without angina pectoris: Secondary | ICD-10-CM | POA: Diagnosis not present

## 2021-07-15 DIAGNOSIS — I130 Hypertensive heart and chronic kidney disease with heart failure and stage 1 through stage 4 chronic kidney disease, or unspecified chronic kidney disease: Secondary | ICD-10-CM | POA: Diagnosis not present

## 2021-07-15 DIAGNOSIS — I739 Peripheral vascular disease, unspecified: Secondary | ICD-10-CM | POA: Diagnosis not present

## 2021-07-15 NOTE — Patient Outreach (Signed)
[facility]) Care Management  07/15/2021  DAYTON SHERR 1941/04/16 [id number]   Telephone call to daughter [name] for follow up. No answer.  Unable to leave a message.    Plan: RN CM  will attempt again in the month of January.   [name], RN, MSN [place] Management Care Management Coordinator Direct Line [phone] Cell [phone] Toll Free: [phone]  Fax: [phone]

## 2021-07-26 ENCOUNTER — Other Ambulatory Visit: Payer: Self-pay | Admitting: Nurse Practitioner

## 2021-07-26 DIAGNOSIS — M5441 Lumbago with sciatica, right side: Secondary | ICD-10-CM

## 2021-08-15 ENCOUNTER — Other Ambulatory Visit: Payer: Self-pay | Admitting: Nurse Practitioner

## 2021-08-19 ENCOUNTER — Other Ambulatory Visit: Payer: Self-pay

## 2021-08-19 ENCOUNTER — Ambulatory Visit: Payer: Self-pay

## 2021-08-19 NOTE — Patient Outreach (Signed)
[place] [facility]) Care Management  08/19/2021  Deanna [name] 04-19-1941 [id number]   Telephone Assessment    Outreach attempt #1 to patient/caregiver. No answer at present and unable to leave message.    Plan: RN CM will make outreach attempt within 4 business days.   Enzo Montgomery, RN,BSN,CCM [place] Management Telephonic Care Management Coordinator Direct Phone: [phone] Toll Free: [phone] Fax: [phone]

## 2021-08-20 ENCOUNTER — Other Ambulatory Visit: Payer: Self-pay

## 2021-08-20 NOTE — Patient Outreach (Signed)
[facility]) Care Management  08/20/2021  Deanna [name] May 24, 1941 [id number]   Telephone Assessment   Unsuccessful outreach attempt #2 to patient.    Plan: Assigned RN CM will make outreach attempt to patient within the month of Feb. If no return call.   Enzo Montgomery, RN,BSN,CCM [place] Management Telephonic Care Management Coordinator Direct Phone: [phone] Toll Free: [phone]: [phone]

## 2021-08-30 NOTE — Progress Notes (Deleted)
Cardiology Clinic Note   Patient Name: Deanna [name] Date of Encounter: 08/30/2021  Primary Care Provider:  Lauree Chandler, NP Primary Cardiologist:  Quay Burow, MD  Patient Profile    Deanna [name]. Sergent 81 year old female presents the clinic today for a follow-up evaluation of her essential hypertension, coronary artery disease status post CABG, critical lower limb ischemia, PAD, GERD, hypothyroidism, and hyperlipidemia.  Past Medical History    Past Medical History:  Diagnosis Date   Anxiety    Bacteremia, escherichia coli 04/27/2015   Brachial-basilar insufficiency syndrome    CAD (coronary artery disease)    Celiac artery stenosis (HCC)    Chronic bilateral low back pain without sciatica    Chronic combined systolic (congestive) and diastolic (congestive) heart failure ([place]) 11/14/2017   CKD (chronic kidney disease), stage IV (HCC)    Cognitive impairment    Colon polyps    Community acquired pneumonia    Depression    Diverticulosis    Dysuria    Encephalomalacia    GAD (generalized anxiety disorder)    GERD (gastroesophageal reflux disease)    Glaucoma    Hearing loss    Hemorrhoids    Hypertension    Hypothyroidism    Incontinence    Mixed hyperlipidemia    Myocardial infarction (HCC)    OSA on CPAP    PAF (paroxysmal atrial fibrillation) ([place])    Peripheral arterial disease ([place])    Pneumonitis 04/26/2015   Pyelonephritis due to Escherichia coli 04/28/2015   Sepsis (HCC)    Spondylosis of lumbar region without myelopathy or radiculopathy    TBI (traumatic brain injury)    Urticaria    Vertigo, benign positional    Past Surgical History:  Procedure Laterality Date   ABDOMINAL AORTOGRAM W/LOWER EXTREMITY N/A 08/22/2017   Procedure: ABDOMINAL AORTOGRAM W/LOWER EXTREMITY;  Surgeon: Serafina Mitchell, MD;  Location: [place] [facility];  Service: Cardiovascular;  Laterality: N/A;   BILATERAL UPPER EXTREMITY ANGIOGRAM N/A 09/11/2012   Procedure:  BILATERAL UPPER EXTREMITY ANGIOGRAM;  Surgeon: Serafina Mitchell, MD;  Location: [facility] CATH LAB;  Service: Cardiovascular;  Laterality: N/A;   BIOPSY  03/25/2020   Procedure: BIOPSY;  Surgeon: Irving Copas., MD;  Location: [place];  Service: Gastroenterology;;   carotid duplex doppler Bilateral 09/03/2012, 11/03/2011   Evidence of 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 patent left common carotid- subclavian artery bypass with turbulent flow noted at the anastomosis with velocities of 295 cm/s   CAROTID-SUBCLAVIAN BYPASS GRAFT  12/15/2011   Procedure: BYPASS GRAFT CAROTID-SUBCLAVIAN;  Surgeon: Serafina Mitchell, MD;  Location: [facility];  Service: Vascular;  Laterality: Left;  Left Carotid subclavian bypass   CORONARY ANGIOPLASTY     CORONARY ARTERY BYPASS GRAFT     CORONARY ATHERECTOMY N/A 03/19/2020   Procedure: CORONARY ATHERECTOMY;  Surgeon: Jettie Booze, MD;  Location: [place] [facility];  Service: Cardiovascular;  Laterality: N/A;   CORONARY STENT INTERVENTION N/A 03/19/2020   Procedure: CORONARY STENT INTERVENTION;  Surgeon: Jettie Booze, MD;  Location: [place] [facility];  Service: Cardiovascular;  Laterality: N/A;   DOPPLER ECHOCARDIOGRAPHY  05/27/2010, 09/17/2008   Mild Proximal septal thickening is noted. Left ventricular systolic functions is normal ejection fraction =>55%. the aortic valve appears to be mildly sclerotic    ESOPHAGOGASTRODUODENOSCOPY (EGD) WITH PROPOFOL N/A 03/25/2020   Procedure: ESOPHAGOGASTRODUODENOSCOPY (EGD) WITH PROPOFOL;  Surgeon: Irving Copas., MD;  Location: [place];  Service: Gastroenterology;  Laterality: N/A;   fem-fem bypass graft  1999   holter monitor  01/21/2008   The predominant rhythm was normal sinus rhythm. Minimum heartrate of 63 bpm at +01:00, maximum heartrate of 105 bpm at +  10:35; and the average heartrate of 75 bpm. Ventricular ectopic activity totaled 1270: Multifocal; 866-PVC's and 404-VEs              JOINT REPLACEMENT     Left knee   LEFT HEART CATH AND CORS/GRAFTS ANGIOGRAPHY N/A 03/19/2020   Procedure: LEFT HEART CATH AND CORS/GRAFTS ANGIOGRAPHY;  Surgeon: Jettie Booze, MD;  Location: [place] [facility];  Service: Cardiovascular;  Laterality: N/A;   LEFT HEART CATHETERIZATION WITH CORONARY/GRAFT ANGIOGRAM N/A 12/21/2011   Procedure: LEFT HEART CATHETERIZATION WITH Beatrix Fetters;  Surgeon: Leonie Man, MD;  Location: [facility] CATH LAB;  Service: Cardiovascular;  Laterality: N/A;   NM MYOCAR PERF EJECTION FRACTION  09/22/2009, 07/03/2007   the post stress myocardial perfusion images show a normal pattern of perfusion is all regions. The post-stress ejection fraction is 68 %. no significant wall motion abnormalities noted. This is a low risk scan.   PERIPHERAL VASCULAR INTERVENTION  08/22/2017   Procedure: PERIPHERAL VASCULAR INTERVENTION;  Surgeon: Serafina Mitchell, MD;  Location: [place] [facility];  Service: Cardiovascular;;  Fem/Fem Graft   REPLACEMENT TOTAL KNEE  05-2011   UNILATERAL UPPER EXTREMEITY ANGIOGRAM Left 11/15/2011   Procedure: UNILATERAL UPPER Anselmo Rod;  Surgeon: Lorretta Harp, MD;  Location: [facility] CATH LAB;  Service: Cardiovascular;  Laterality: Left;    Allergies  Allergies  Allergen Reactions   Donepezil Other (See Comments)    Altered mood, aggression, and caused anger   Duloxetine Hcl     Increased confusion and memory concerns    Meloxicam     Pt suspects that this medicine causes fluid on her lungs.    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 Rask has a  PMH of essential hypertension, coronary artery disease status post CABG, critical lower limb ischemia, PAD, GERD, hypothyroidism, obesity, anxiety, depression, thrombocytopenia, chronic pain, and hyperlipidemia.  She had an MI with PTCA to her left anterior descending in 1994, PTCA to LCx in 1995 and underwent CABG in 1999.  Due to her PAD she had left to right femorofemoral crossover and has also had left subclavian artery stenosis and left common carotid to subclavian artery bypass.  She presented to [facility] on 03/18/2020 and was discharged on 04/01/2020.  She presented to the hospital with chest pain and was found to have elevated troponins.  Her last cardiac catheterization 2013 via her left common femoral artery below femorofemoral bypass anastomosis showed LIMA-LAD with occluded LT ICA, SVG to diagonal patent, SVG to OM patent, SVG to RCA 100% thrombotic.  There was a large radiopaque filling defect just prior to her occlusion site.  This was not safe PCI for fear of distal embolization.  A widely patent left common and external iliac with brisk flow L-R femorofemoral bypass graft was noted.  She also indicated that she was having shortness of breath and back pain.  Her chest pain was  described as burning pain  Her back  pain had increased.  EKG showed sinus tach at 103, flattened T waves, inferior lateral and on later EKGs increase in ST depression.  She underwent cardiac catheterization on 03/19/2020 which showed ostial LAD-proximal LAD lesion 99% LIMA-LAD known to be atrertic.  She underwent orbital arthrectomy and received DES x1.  She also has mid LAD 100% stenosis however, arthrectomy and angioplasty could not restore flow.  Mid circumflex 90% stenosed SVG-OM patent.  Proximal RCA lesion 100% stenosed SVG-PDA occluded LVEF 25-35%.  Her echocardiogram 03/19/2020 showed an LVEF of 35-40% G2 DD, mildly reduced right ventricular function and mild mitral valve regurgitation.  She presented to  the clinic 04/22/2020 for follow-up evaluation with her daughter and stated she was feeling much better.  She continued to be on oxygen 1.5 L nasal cannula.  Her endurance had improved as well as her strength.  She continued rehab at [facility].  They were planning to evaluate that week whether she could live with her daughter.  Her daughter indicated that they were having a shower remodeled so that her mom had easier access.  She was performing physical therapy 1 times daily with walking, marching in place, and strength type exercises.  She denied chest or back pain.  I  ordered a BMP and CBC, gave salty 6 diet sheet, and planned follow-up with Dr. Gwenlyn Found in 3 months.  She was seen by Roby Lofts, PA-C on 06/02/2021.  She had been admitted to the hospital 10-22 until 05/11/2021.  She had an episode of acute respiratory failure in the setting of URI and bronchitis.  Her echocardiogram showed an EF of 50-55% which was improved from her previous 35-40% 8/21 echo.  She presented with her daughter.  She felt that she was doing fairly well from a cardiac standpoint.  She had been weaned down to 1 L nasal cannula.  She was hoping to wean off of her oxygen completely.  She did note some back pain which she attributed to an uncomfortable recliner.  Her previous anginal equivalent was back discomfort.  She did not feel her back pain was similar to her previous anginal equivalent pain.  She denied chest pain, palpitations, orthopnea, lower extremity edema, and syncopal episodes.  The patient's daughter did mention trouble with memory.  The patient was unaware of her memory issues.  However, it was noted that she did repeat similar phrases/questions throughout her evaluation.  She presents to the clinic today for follow-up evaluation and states***  Today she denies chest pain, shortness of breath, lower extremity edema, fatigue, palpitations, melena, hematuria, hemoptysis, diaphoresis, weakness, presyncope, syncope, orthopnea,  and PND.   Home Medications    Prior to Admission medications   Medication Sig Start Date End Date Taking? Authorizing Provider  acetaminophen (TYLENOL) 325 MG tablet Take 325 mg by mouth in the morning and at bedtime. 1 by mouth in the morning and 1 by mouth in the pm.    [provider]  ALPRAZolam Duanne Moron) 0.5 MG tablet Take 1 tablet (0.5 mg total) by mouth daily as needed for anxiety. 02/26/20   Thayer Headings, PMHNP  aspirin EC 81 MG tablet Take 81 mg by mouth at bedtime.     [provider]  brimonidine (ALPHAGAN) 0.2 % ophthalmic solution Place 1 drop into both eyes 2 (two) times daily.    [provider]  calcium carbonate (TUMS - DOSED IN MG ELEMENTAL CALCIUM) 500 MG chewable tablet Chew 1 tablet by  mouth daily as needed for indigestion or heartburn.    [provider]  cholecalciferol (VITAMIN D) 400 units TABS tablet Take 400 Units by mouth daily.    [provider]  clopidogrel (PLAVIX) 75 MG tablet TAKE 1 TABLET (75 MG TOTAL) BY MOUTH DAILY. 11/25/19   Lorretta Harp, MD  dapagliflozin propanediol (FARXIGA) 10 MG TABS tablet Take 1 tablet (10 mg total) by mouth daily. 04/01/20   Riesa Pope, MD  divalproex (DEPAKOTE ER) 250 MG 24 hr tablet TAKE 1 TABLET(250 MG) BY MOUTH TWICE DAILY 02/26/20   Thayer Headings, PMHNP  dorzolamide (TRUSOPT) 2 % ophthalmic solution Place 1 drop into both eyes 2 (two) times daily.    [provider]  escitalopram (LEXAPRO) 20 MG tablet TAKE 1 TABLET(20 MG) BY MOUTH DAILY Patient taking differently: Take 20 mg by mouth daily.  02/26/20   Thayer Headings, PMHNP  furosemide (LASIX) 40 MG tablet Take 1 tablet (40 mg total) by mouth daily. Please continue daily dosing until your follow up cardiology appt. 04/01/20 04/01/21  Buford Dresser, MD  gabapentin (NEURONTIN) 100 MG capsule Take 1 capsule (100 mg total) by mouth 2 (two) times daily as needed. Patient taking differently: Take 200 mg by  mouth 2 (two) times daily as needed (For nerve pain).  08/16/19   Lauree Chandler, NP  latanoprost (XALATAN) 0.005 % ophthalmic solution Place 1 drop into both eyes at bedtime.    [provider]  levothyroxine (SYNTHROID) 100 MCG tablet Take 1 tablet (100 mcg total) by mouth daily before breakfast. To take with 88 mcg tablet to equal 188 mcg total 12/13/19   Lauree Chandler, NP  levothyroxine (SYNTHROID) 88 MCG tablet Take 1 tablet (88 mcg total) by mouth daily before breakfast. To take with 100 mcg tablet to equal 188 mcg total 12/13/19   Lauree Chandler, NP  meclizine (ANTIVERT) 25 MG tablet Take 1 tablet (25 mg total) by mouth as needed for dizziness. 11/30/18   Lauree Chandler, NP  metoprolol succinate (TOPROL-XL) 25 MG 24 hr tablet Take 1 tablet (25 mg total) by mouth daily. 04/01/20   Furth, Cadence H, PA-C  mirabegron ER (MYRBETRIQ) 50 MG TB24 tablet Take 50 mg by mouth daily.    [provider]  nitroGLYCERIN (NITROSTAT) 0.4 MG SL tablet Place 1 tablet (0.4 mg total) under the tongue every 5 (five) minutes x 3 doses as needed for chest pain. 03/31/20   Riesa Pope, MD  pantoprazole (PROTONIX) 40 MG tablet TAKE 1 TABLET EVERY DAY 04/20/20   Lauree Chandler, NP  rosuvastatin (CRESTOR) 40 MG tablet Take 1 tablet (40 mg total) by mouth daily. 08/23/19   Lauree Chandler, NP  tiZANidine (ZANAFLEX) 2 MG tablet Take 1 tablet (2 mg total) by mouth every 6 (six) hours as needed for muscle spasms. 07/01/19   Ngetich, Dinah C, NP  valsartan (DIOVAN) 40 MG tablet Take 1 tablet (40 mg total) by mouth every morning. 04/01/20   Buford Dresser, MD  vitamin B-12 (CYANOCOBALAMIN) 1000 MCG tablet Take 1,000 mcg by mouth daily.    [provider]    Family History    Family History  Problem Relation Age of Onset   Heart attack Mother    Heart disease Mother        before age 40   Diabetes Father    Heart disease Father    Hypertension Father     Hyperlipidemia Father    Heart attack  Father 13   Heart attack Brother 35   Cerebral palsy Sister 64   Congestive Heart Failure Sister 87   Heart attack Sister 64   Hypertension Sister 34   Dementia Sister    Anxiety disorder Sister    Anxiety disorder Sister 97   Heart Problems Sister    Stroke Sister    Heart Problems Sister 54   Heart attack Sister 72   Colon cancer Brother 65   Prostate cancer Brother 73   Hypertension Daughter    Irritable bowel syndrome Daughter    Depression Daughter    Anxiety disorder Daughter    She indicated that her mother is deceased. She indicated that her father is deceased. She indicated that two of her seven sisters are alive. She indicated that only one of her three brothers is alive. She indicated that her daughter is alive.  Social History    Social History   Socioeconomic History   Marital status: Divorced    Spouse name: Not on file   Number of children: Not on file   Years of education: Not on file   Highest education level: Not on file  Occupational History   Not on file  Tobacco Use   Smoking status: Former    Years: 3.00    Types: Cigarettes    Quit date: 11/27/1981    Years since quitting: 39.7   Smokeless tobacco: Never  Vaping Use   Vaping Use: Never used  Substance and Sexual Activity   Alcohol use: Not Currently    Alcohol/week: 0.0 standard drinks   Drug use: No   Sexual activity: Not Currently    Comment: 1st intercourse 89 yo-1 partner  Other Topics Concern   Not on file  Social History Narrative   Social History      Diet? Healthy but too many sweets      Do you drink/eat things with caffeine? Yes occasionally      Marital status?                    D                What year were you married?      Do you live in a house, apartment, assisted living, condo, trailer, etc.? condo      Is it one or more stories? 1      How many persons live in your home? 2      Do you have any pets in your home? (please  list) 1 cat      Highest level of education completed? 1 year college      Current or past profession: housewife      Do you exercise?           no                           Type & how often?      Advanced Directives      Do you have a living will? no      Do you have a DNR form?             no                     If not, do you want to discuss one? yes      Do you have signed POA/HPOA for forms? no  Functional Status      Do you have difficulty bathing or dressing yourself? No- gets tired      Do you have difficulty preparing food or eating? No- eats frozen entrees or poor nutrition meals      Do you have difficulty managing your medications? yes      Do you have difficulty managing your finances? yes      Do you have difficulty affording your medications? No- funds running low   Social Determinants of Radio broadcast assistant Strain: Not on file  Food Insecurity: Not on file  Transportation Needs: No Transportation Needs   Lack of Transportation (Medical): No   Lack of Transportation (Non-Medical): No  Physical Activity: Not on file  Stress: Not on file  Social Connections: Not on file  Intimate Partner Violence: Not on file     Review of Systems    General:  No chills, fever, night sweats or weight changes.  Cardiovascular:  No chest pain, dyspnea on exertion, edema, orthopnea, palpitations, paroxysmal nocturnal dyspnea. Dermatological: No rash, lesions/masses Respiratory: No cough, dyspnea Urologic: No hematuria, dysuria Abdominal:   No nausea, vomiting, diarrhea, bright red blood per rectum, melena, or hematemesis Neurologic:  No visual changes, wkns, changes in mental status. All other systems reviewed and are otherwise negative except as noted above.  Physical Exam    VS:  There were no vitals taken for this visit. , BMI There is no height or weight on file to calculate BMI. GEN: Well nourished, well developed, in no acute distress. HEENT:  normal. Neck: Supple, no JVD, carotid bruits, or masses. Cardiac: RRR, no murmurs, rubs, or gallops. No clubbing, cyanosis, edema.  Radials/DP/PT 2+ and equal bilaterally.  Respiratory:  Respirations regular and unlabored, clear to auscultation bilaterally. GI: Soft, nontender, nondistended, BS + x 4. MS: no deformity or atrophy. Skin: warm and dry, no rash. Neuro:  Strength and sensation are intact. Psych: Normal affect.  Accessory Clinical Findings    Recent Labs: 04/07/2021: TSH 25.54 05/09/2021: B Natriuretic Peptide 611.0; Magnesium 1.8 05/20/2021: ALT 8; Platelets 165 06/24/2021: BUN 27; Creatinine 2.0; Hemoglobin 12.9; Potassium 3.6; Sodium 143   Recent Lipid Panel    Component Value Date/Time   CHOL 110 05/10/2021 0142   TRIG 160 (H) 05/10/2021 0142   HDL 34 (L) 05/10/2021 0142   CHOLHDL 3.2 05/10/2021 0142   VLDL 32 05/10/2021 0142   LDLCALC 44 05/10/2021 0142   LDLCALC 70 09/02/2020 1440    ECG personally reviewed by me today-normal sinus rhythm inferior infarct undetermined age, anterior infarct undetermined age, ST and T wave abnormality consider lateral ischemia 71 bpm- No acute changes  Echocardiogram 03/19/2020  IMPRESSIONS     1. Left ventricular ejection fraction, by estimation, is 35 to 40%. The  left ventricle has moderately decreased function. The left ventricle  demonstrates regional wall motion abnormalities (see scoring  diagram/findings for description). Left ventricular   diastolic parameters are consistent with Grade II diastolic dysfunction  (pseudonormalization). There is severe akinesis of the left ventricular,  mid-apical anteroseptal wall.   2. Right ventricular systolic function is mildly reduced. The right  ventricular size is normal.   3. The mitral valve is grossly normal. Mild mitral valve regurgitation.   4. The aortic valve is normal in structure. Aortic valve regurgitation is  not visualized. No aortic stenosis is present.    Echocardiogram 05/09/2021 IMPRESSIONS     1. Left ventricular ejection fraction, by estimation, is 50 to 55%.  The  left ventricle has low normal function. The left ventricle demonstrates  regional wall motion abnormalities (see scoring diagram/findings for  description). There is mild concentric  left ventricular hypertrophy. Left ventricular diastolic parameters are  indeterminate. Elevated left ventricular end-diastolic pressure. There is  akinesis of the left ventricular, apical septal wall, inferior wall and  inferolateral wall. There is  akinesis of the left ventricular, entire apical segment. There is  hypokinesis of the left ventricular, mid-apical anteroseptal wall.   2. Right ventricular systolic function is normal. The right ventricular  size is normal.   3. The mitral valve is normal in structure. Mild mitral valve  regurgitation. No evidence of mitral stenosis. Moderate mitral annular  calcification.   4. The aortic valve is tricuspid. Aortic valve regurgitation is not  visualized. Mild aortic valve sclerosis is present, with no evidence of  aortic valve stenosis.   5. The inferior vena cava is normal in size with greater than 50%  respiratory variability, suggesting right atrial pressure of 3 mmHg.  Cardiac catheterization 03/19/2020 Ost LAD to Prox LAD lesion is 99% stenosed. LIMA to LAD known to be atretic. After orbital atherectomy, A drug-eluting stent was successfully placed using a STENT RESOLUTE ONYX 3.5X15. Post intervention, there is a 0% residual stenosis. Mid LAD lesion is 100% stenosed. Despite atherectomy and angioplasty, flow could not be restored. Mid Cx lesion is 100% stenosed. SVG to OM patent. Prox RCA lesion is 100% stenosed. SVG to PDA occluded. There is moderate left ventricular systolic dysfunction. LV end diastolic pressure is moderately elevated. The left ventricular ejection fraction is 25-35% by visual estimate. There is no aortic valve  stenosis. Balloon angioplasty was performed using a BALLOON SAPPHIRE 2.0X12.   Complex intervention due to severe three-vessel coronary artery disease with prior bypass surgery.  Successful atherectomy and stenting of the proximal LAD.  Unable to restore flow through the mid LAD.  I suspect, she is a somewhat late presenting infarct.  She already has evidence of heart failure with elevated LVEDP.  Will need ongoing medical therapy to help her LV dysfunction.   Diagnostic Dominance: Right  Intervention       Assessment & Plan   1.   Coronary artery disease/NSTEMI-no chest pain today.  Previous CABG.  She presented to the emergency department with chest pain and elevated troponins 8/21.  EKG did not show ischemic changes.  She underwent cardiac catheterization 03/19/2020 and received arthrectomy and DES x1 to her proximal LAD.  Arthrectomy was also attempted in her mid LAD however flow was not able to be restored.  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  Breathing stable.  Echocardiogram showed an LVEF of 50-55%, intermediate diastolic parameters. Continue furosemide, metoprolol, valsartan Heart healthy low-sodium diet Increase physical activity as tolerated Continue daily weights  Paroxysmal atrial fibrillation-heart rate today***.  Noted to have brief episode of atrial fibrillation noted following cardiac catheterization.  She converted to normal sinus rhythm amiodarone.  Her amiodarone was subsequently stopped and she did not have any recurrent atrial fibrillation.  Anticoagulation was not initiated. Continue metoprolol Heart healthy low-sodium diet Increase physical activity as tolerated Continue to monitor and avoid triggers  Hyperlipidemia-05/10/2021: Cholesterol 110; HDL 34; LDL Cholesterol 44; Triglycerides  160; VLDL 32 Continue rosuvastatin Heart healthy low-sodium diet-salty 6 given Increase physical activity as tolerated  Disposition: Follow-up with Dr. Gwenlyn Found in 4-6 months  Jossie Ng. Brexton Sofia NP-C    08/30/2021, 8:52 AM [place] [address] Office [phone] Fax [phone]  Notice: This dictation was prepared with Dragon dictation along with smaller phrase technology. Any transcriptional errors that result from this process are unintentional and may not be corrected upon review.

## 2021-08-31 ENCOUNTER — Ambulatory Visit: Payer: Medicare HMO | Admitting: General Practice

## 2021-09-14 DIAGNOSIS — D631 Anemia in chronic kidney disease: Secondary | ICD-10-CM | POA: Diagnosis not present

## 2021-09-14 DIAGNOSIS — I2510 Atherosclerotic heart disease of native coronary artery without angina pectoris: Secondary | ICD-10-CM | POA: Diagnosis not present

## 2021-09-14 DIAGNOSIS — E039 Hypothyroidism, unspecified: Secondary | ICD-10-CM | POA: Diagnosis not present

## 2021-09-14 DIAGNOSIS — I129 Hypertensive chronic kidney disease with stage 1 through stage 4 chronic kidney disease, or unspecified chronic kidney disease: Secondary | ICD-10-CM | POA: Diagnosis not present

## 2021-09-14 DIAGNOSIS — N2581 Secondary hyperparathyroidism of renal origin: Secondary | ICD-10-CM | POA: Diagnosis not present

## 2021-09-14 DIAGNOSIS — I509 Heart failure, unspecified: Secondary | ICD-10-CM | POA: Diagnosis not present

## 2021-09-14 DIAGNOSIS — N184 Chronic kidney disease, stage 4 (severe): Secondary | ICD-10-CM | POA: Diagnosis not present

## 2021-09-14 DIAGNOSIS — N189 Chronic kidney disease, unspecified: Secondary | ICD-10-CM | POA: Diagnosis not present

## 2021-09-19 ENCOUNTER — Other Ambulatory Visit: Payer: Self-pay | Admitting: Nurse Practitioner

## 2021-09-19 DIAGNOSIS — K219 Gastro-esophageal reflux disease without esophagitis: Secondary | ICD-10-CM

## 2021-09-20 ENCOUNTER — Ambulatory Visit: Payer: Self-pay

## 2021-09-20 ENCOUNTER — Other Ambulatory Visit: Payer: Self-pay

## 2021-09-20 DIAGNOSIS — H401132 Primary open-angle glaucoma, bilateral, moderate stage: Secondary | ICD-10-CM | POA: Diagnosis not present

## 2021-09-20 NOTE — Patient Outreach (Signed)
[place] [facility]) Care Management  09/20/2021  LAURANA MAGISTRO Mar 26, 1941 [id number]   Telephone Assessment    Unsuccessful outreach attempt to patient/caregiver.     Plan: RN CM will make outreach attempt within 4 business days.    Enzo Montgomery, RN,BSN,CCM [place] [facility] Telephonic Care Management Coordinator Direct Phone: [phone] Toll Free: [phone] Fax: [phone]

## 2021-09-21 ENCOUNTER — Other Ambulatory Visit: Payer: Self-pay

## 2021-09-21 NOTE — Patient Outreach (Signed)
[facility]) Care Management  09/21/2021  ELLIANNE GOWEN 07-19-1941 [id number]   Telephone Assessment   Unsuccessful outreach attempt patient.      Plan:  Assigned RN CM will make outreach attempt within the month of March.  Enzo [place], RN,BSN,CCM [place] Management Telephonic Care Management Coordinator Direct Phone: [phone] Toll Free: [phone] Fax: [phone]

## 2021-09-22 ENCOUNTER — Ambulatory Visit: Payer: Self-pay

## 2021-10-11 ENCOUNTER — Ambulatory Visit (INDEPENDENT_AMBULATORY_CARE_PROVIDER_SITE_OTHER): Payer: Medicare HMO | Admitting: Nurse Practitioner

## 2021-10-11 ENCOUNTER — Other Ambulatory Visit: Payer: Self-pay

## 2021-10-11 ENCOUNTER — Encounter: Payer: Self-pay | Admitting: Nurse Practitioner

## 2021-10-11 VITALS — BP 132/78 | HR 75 | Temp 97.3°F | Ht 61.0 in | Wt 196.0 lb

## 2021-10-11 DIAGNOSIS — D696 Thrombocytopenia, unspecified: Secondary | ICD-10-CM

## 2021-10-11 DIAGNOSIS — I480 Paroxysmal atrial fibrillation: Secondary | ICD-10-CM | POA: Diagnosis not present

## 2021-10-11 DIAGNOSIS — L989 Disorder of the skin and subcutaneous tissue, unspecified: Secondary | ICD-10-CM

## 2021-10-11 DIAGNOSIS — F411 Generalized anxiety disorder: Secondary | ICD-10-CM | POA: Diagnosis not present

## 2021-10-11 DIAGNOSIS — I2510 Atherosclerotic heart disease of native coronary artery without angina pectoris: Secondary | ICD-10-CM | POA: Diagnosis not present

## 2021-10-11 DIAGNOSIS — F39 Unspecified mood [affective] disorder: Secondary | ICD-10-CM | POA: Diagnosis not present

## 2021-10-11 DIAGNOSIS — Z9989 Dependence on other enabling machines and devices: Secondary | ICD-10-CM

## 2021-10-11 DIAGNOSIS — G4733 Obstructive sleep apnea (adult) (pediatric): Secondary | ICD-10-CM

## 2021-10-11 DIAGNOSIS — I739 Peripheral vascular disease, unspecified: Secondary | ICD-10-CM | POA: Insufficient documentation

## 2021-10-11 DIAGNOSIS — I509 Heart failure, unspecified: Secondary | ICD-10-CM

## 2021-10-11 DIAGNOSIS — E039 Hypothyroidism, unspecified: Secondary | ICD-10-CM

## 2021-10-11 DIAGNOSIS — I25708 Atherosclerosis of coronary artery bypass graft(s), unspecified, with other forms of angina pectoris: Secondary | ICD-10-CM

## 2021-10-11 NOTE — Progress Notes (Signed)
Careteam: Patient Care Team: Lauree Chandler, NP as PCP - General (Geriatric Medicine) Lorretta Harp, MD as PCP - Cardiology (Cardiology) Irene Shipper, MD as Consulting Physician (Gastroenterology) Frederik Pear, MD as Consulting Physician (Orthopedic Surgery) Fontaine, Belinda Block, MD (Inactive) as Consulting Physician (Gynecology) Rutherford Guys, MD as Consulting Physician (Ophthalmology) Bjorn Loser, MD as Consulting Physician (Urology) Serafina Mitchell, MD as Consulting Physician (Vascular Surgery) Cameron Sprang, MD as Consulting Physician (Neurology) Desma Maxim, MD as Referring Physician (Ophthalmology) Thayer Headings, PMHNP as Nurse Practitioner (Psychiatry) Cameron Sprang, MD as Consulting Physician (Neurology) Jon Billings, RN as [place] Management  PLACE OF SERVICE:  [place] Directive information Does Patient Have a Medical Advance Directive?: Yes, Type of Advance Directive: [place], Does patient want to make changes to medical advance directive?: No - Patient declined  Allergies  Allergen Reactions   Donepezil Other (See Comments)    Altered mood, aggression, and caused anger   Duloxetine Hcl     Increased confusion and memory concerns    Meloxicam     Pt suspects that this medicine causes fluid on her lungs.    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 Discuss need for td/tdap, shingrix, additional covid boosters, and colonoscopy or post pone if patient refuses. Discuss a more effective way to take levothyroxine. Bladder medication is not working. Here with daughter [name].       HPI: Patient is a 81 y.o. female for routine follow up.   Recommended follow up colonoscopy in 5 years she saw Dr Henrene Pastor in the past but due to other comorbidies not recommended for colonoscopy   Hard to get medication in her before she eats in regards to her thyroid.   She has incontinence- using myrbetriq 50 mg daily, she has total incontinence. Daughter does not feel like she is benefiting from medication.   Nightly has episodes of sundowning from 4-5, increase anxiety and agitation. Daughter has to help redirect.  Mood has overall been stable. Continues to see psych NP. Currently on buspar, depakote and lexapro   GERD- continues on protonix 40 mg daily   CHF- symptoms stable, continues on farxiga, lopressor and lasix  Hyperlipidemia/CAD- on crestor and plavix with ASA Review of Systems:  Review of Systems  Constitutional:  Negative for chills, fever and weight loss.  HENT:  Negative for tinnitus.   Respiratory:  Negative for cough, sputum production and shortness of breath.   Cardiovascular:  Negative for chest pain, palpitations and leg swelling.  Gastrointestinal:  Negative for abdominal pain, constipation, diarrhea and heartburn.  Genitourinary:  Positive for frequency and urgency. Negative for dysuria.  Musculoskeletal:  Positive for back pain. Negative for falls, joint pain and myalgias.  Skin: Negative.   Neurological:  Negative for dizziness and headaches.  Psychiatric/Behavioral:  Positive for memory loss. Negative for depression. The patient is nervous/anxious (improved). The patient does not have insomnia.    Past Medical History:  Diagnosis Date   Anxiety    Bacteremia, escherichia coli 04/27/2015   Brachial-basilar insufficiency syndrome    CAD (coronary artery disease)  Celiac artery stenosis (HCC)    Chronic bilateral low back pain without sciatica    Chronic combined systolic (congestive) and diastolic (congestive) heart failure ([place]) 11/14/2017   CKD  (chronic kidney disease), stage IV (HCC)    Cognitive impairment    Colon polyps    Community acquired pneumonia    Depression    Diverticulosis    Dysuria    Encephalomalacia    GAD (generalized anxiety disorder)    GERD (gastroesophageal reflux disease)    Glaucoma    Hearing loss    Hemorrhoids    Hypertension    Hypothyroidism    Incontinence    Mixed hyperlipidemia    Myocardial infarction (HCC)    OSA on CPAP    PAF (paroxysmal atrial fibrillation) (HCC)    Peripheral arterial disease ([place])    Pneumonitis 04/26/2015   Pyelonephritis due to Escherichia coli 04/28/2015   Sepsis ([place])    Spondylosis of lumbar region without myelopathy or radiculopathy    TBI (traumatic brain injury)    Urticaria    Vertigo, benign positional    Past Surgical History:  Procedure Laterality Date   ABDOMINAL AORTOGRAM W/LOWER EXTREMITY N/A 08/22/2017   Procedure: ABDOMINAL AORTOGRAM W/LOWER EXTREMITY;  Surgeon: Serafina Mitchell, MD;  Location: [place] [facility];  Service: Cardiovascular;  Laterality: N/A;   BILATERAL UPPER EXTREMITY ANGIOGRAM N/A 09/11/2012   Procedure: BILATERAL UPPER EXTREMITY ANGIOGRAM;  Surgeon: Serafina Mitchell, MD;  [facility] CATH LAB;  Service: Cardiovascular;  Laterality: N/A;   BIOPSY  03/25/2020   Procedure: BIOPSY;  Surgeon: Irving Copas., MD;  Location: [place];  Service: Gastroenterology;;   carotid duplex doppler Bilateral 09/03/2012, 11/03/2011   Evidence of 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 patent left common carotid- subclavian artery bypass with turbulent flow noted at the anastomosis with velocities of 295 cm/s   CAROTID-SUBCLAVIAN BYPASS GRAFT  12/15/2011   Procedure: BYPASS GRAFT CAROTID-SUBCLAVIAN;  Surgeon: Serafina Mitchell, MD;  Location[facility] OR;  Service: Vascular;  Laterality: Left;  Left  Carotid subclavian bypass   CORONARY ANGIOPLASTY     CORONARY ARTERY BYPASS GRAFT     CORONARY ATHERECTOMY N/A 03/19/2020   Procedure: CORONARY ATHERECTOMY;  Surgeon: Jettie Booze, MD;  Location: [place] [facility];  Service: Cardiovascular;  Laterality: N/A;   CORONARY STENT INTERVENTION N/A 03/19/2020   Procedure: CORONARY STENT INTERVENTION;  Surgeon: Jettie Booze, MD;  Location: [place] [facility];  Service: Cardiovascular;  Laterality: N/A;   DOPPLER ECHOCARDIOGRAPHY  05/27/2010, 09/17/2008   Mild Proximal septal thickening is noted. Left ventricular systolic functions is normal ejection fraction =>55%. the aortic valve appears to be mildly sclerotic    ESOPHAGOGASTRODUODENOSCOPY (EGD) WITH PROPOFOL N/A 03/25/2020   Procedure: ESOPHAGOGASTRODUODENOSCOPY (EGD) WITH PROPOFOL;  Surgeon: Rush Landmark Telford Nab., MD;  Location: [place];  Service: Gastroenterology;  Laterality: N/A;   fem-fem bypass graft  1999   holter monitor  01/21/2008   The predominant rhythm was normal sinus rhythm. Minimum heartrate of 63 bpm at +01:00, maximum heartrate of 105 bpm at + 10:35; and the average heartrate of 75 bpm. Ventricular ectopic activity totaled 1270: Multifocal; 866-PVC's and 404-VEs              JOINT REPLACEMENT     Left knee   LEFT HEART CATH AND CORS/GRAFTS ANGIOGRAPHY N/A 03/19/2020   Procedure: LEFT HEART CATH  AND CORS/GRAFTS ANGIOGRAPHY;  Surgeon: Jettie Booze, MD;  Location: [place] [facility];  Service: Cardiovascular;  Laterality: N/A;   LEFT HEART CATHETERIZATION WITH CORONARY/GRAFT ANGIOGRAM N/A 12/21/2011   Procedure: LEFT HEART CATHETERIZATION WITH Beatrix Fetters;  Surgeon: Leonie Man, MD;  Location[facility] CATH LAB;  Service: Cardiovascular;  Laterality: N/A;   NM MYOCAR PERF EJECTION FRACTION  09/22/2009, 07/03/2007   the post stress myocardial perfusion images show a normal pattern of perfusion is all regions. The post-stress ejection fraction is 68 %.  no significant wall motion abnormalities noted. This is a low risk scan.   PERIPHERAL VASCULAR INTERVENTION  08/22/2017   Procedure: PERIPHERAL VASCULAR INTERVENTION;  Surgeon: Serafina Mitchell, MD;  Location: [place] [facility];  Service: Cardiovascular;;  Fem/Fem Graft   REPLACEMENT TOTAL KNEE  05-2011   UNILATERAL UPPER EXTREMEITY ANGIOGRAM Left 11/15/2011   Procedure: UNILATERAL UPPER Anselmo Rod;  Surgeon: Lorretta Harp, MD;  Location: [facility] CATH LAB;  Service: Cardiovascular;  Laterality: Left;   Social History:   reports that she quit smoking about 39 years ago. Her smoking use included cigarettes. She has never used smokeless tobacco. She reports that she does not currently use alcohol. She reports that she does not use drugs.  Family History  Problem Relation Age of Onset   Heart attack Mother    Heart disease Mother        before age 52   Diabetes Father    Heart disease Father    Hypertension Father    Hyperlipidemia Father    Heart attack Father 32   Heart attack Brother 80   Cerebral palsy Sister 61   Congestive Heart Failure Sister 53   Heart attack Sister 5   Hypertension Sister 77   Dementia Sister    Anxiety disorder Sister    Anxiety disorder Sister 72   Heart Problems Sister    Stroke Sister    Heart Problems Sister 55   Heart attack Sister 89   Colon cancer Brother 38   Prostate cancer Brother 61   Hypertension Daughter    Irritable bowel syndrome Daughter    Depression Daughter    Anxiety disorder Daughter     Medications: Patient's Medications  New Prescriptions   No medications on file  Previous Medications   ACETAMINOPHEN (TYLENOL) 325 MG TABLET    Take 325 mg by mouth in the morning and at bedtime. 1 by mouth in the morning and 1 by mouth in the pm.   ALPRAZOLAM (XANAX) 0.5 MG TABLET    Take 1 tablet (0.5 mg total) by mouth daily as needed for anxiety.   ASPIRIN EC 81 MG TABLET    Take 81 mg by mouth at bedtime.    BUSPIRONE (BUSPAR) 15  MG TABLET    Take 15 mg by mouth 2 (two) times daily.   CHOLECALCIFEROL (VITAMIN D) 400 UNITS TABS TABLET    Take 400 Units by mouth daily.   CLOPIDOGREL (PLAVIX) 75 MG TABLET    TAKE 1 TABLET(75 MG) BY MOUTH DAILY   DIVALPROEX (DEPAKOTE ER) 250 MG 24 HR TABLET    Take 1 tablet (250 mg total) by mouth at bedtime.   DORZOLAMIDE (TRUSOPT) 2 % OPHTHALMIC SOLUTION    Place 1 drop into both eyes 2 (two) times daily.   ESCITALOPRAM (LEXAPRO) 20 MG TABLET    Take 1 tablet (20 mg total) by mouth daily.   FARXIGA 10 MG TABS TABLET  TAKE 1 TABLET(10 MG) BY MOUTH DAILY   FUROSEMIDE (LASIX) 40 MG TABLET    TAKE 1 TABLET(40 MG) BY MOUTH DAILY   GABAPENTIN (NEURONTIN) 100 MG CAPSULE    TAKE 2 CAPSULES(200 MG) BY MOUTH DAILY   LATANOPROST (XALATAN) 0.005 % OPHTHALMIC SOLUTION    Place 1 drop into both eyes at bedtime.   LEVOTHYROXINE (SYNTHROID) 175 MCG TABLET    Take 1 tablet (175 mcg total) by mouth daily. Take at the same time daily separate from other medications   MECLIZINE (ANTIVERT) 25 MG TABLET    Take 1 tablet (25 mg total) by mouth as needed for dizziness.   METOPROLOL TARTRATE (LOPRESSOR) 25 MG TABLET    Take 12.5 mg by mouth 2 (two) times daily.   MYRBETRIQ 50 MG TB24 TABLET    50 mg daily.   NITROGLYCERIN (NITROSTAT) 0.4 MG SL TABLET    Place 1 tablet (0.4 mg total) under the tongue every 5 (five) minutes x 3 doses as needed for chest pain.   PANTOPRAZOLE (PROTONIX) 40 MG TABLET    TAKE 1 TABLET EVERY DAY   ROSUVASTATIN (CRESTOR) 40 MG TABLET    TAKE 1 TABLET(40 MG) BY MOUTH DAILY   TIZANIDINE (ZANAFLEX) 2 MG TABLET    Take 1 tablet (2 mg total) by mouth every 6 (six) hours as needed for muscle spasms.   VITAMIN B-12 (CYANOCOBALAMIN) 1000 MCG TABLET    Take 1,000 mcg by mouth daily.  Modified Medications   No medications on file  Discontinued Medications   BUSPIRONE (BUSPAR) 15 MG TABLET    Take 1/3 tablet p.o. twice daily for 1 week, then take 2/3 tablet p.o. twice daily for 1 week, then take  1 tablet p.o. twice daily   VALSARTAN (DIOVAN) 80 MG TABLET    TAKE 1 TABLET EVERY MORNING    Physical Exam:  Vitals:   10/11/21 1509  BP: 132/78  Pulse: 75  Temp: (!) 97.3 F (36.3 C)  TempSrc: Temporal  SpO2: 93%  Weight: 196 lb (88.9 kg)  Height: '5\' 1"'  (1.549 m)   Body mass index is 37.03 kg/m. Wt Readings from Last 3 Encounters:  10/11/21 196 lb (88.9 kg)  06/15/21 191 lb (86.6 kg)  06/02/21 191 lb (86.6 kg)    Physical Exam Constitutional:      General: She is not in acute distress.    Appearance: She is well-developed. She is obese. She is not diaphoretic.  HENT:     Head: Normocephalic and atraumatic.     Mouth/Throat:     Pharynx: No oropharyngeal exudate.  Eyes:     Conjunctiva/sclera: Conjunctivae normal.     Pupils: Pupils are equal, round, and reactive to light.  Cardiovascular:     Rate and Rhythm: Normal rate and regular rhythm.     Heart sounds: Normal heart sounds.  Pulmonary:     Effort: Pulmonary effort is normal.     Breath sounds: Normal breath sounds.  Abdominal:     General: Bowel sounds are normal.     Palpations: Abdomen is soft.  Musculoskeletal:     Cervical back: Normal range of motion and neck supple.     Right lower leg: No edema.     Left lower leg: No edema.  Skin:    General: Skin is warm and dry.  Neurological:     Mental Status: She is alert.  Psychiatric:        Mood and Affect: Mood normal.  Labs reviewed: Basic Metabolic Panel: Recent Labs    12/07/20 1356 04/07/21 1457 05/09/21 0042 05/09/21 0759 05/10/21 0142 05/11/21 0338 05/20/21 1159 06/24/21 0000  NA 140 137   < > 139 136 137 138 143  K 4.3 4.6   < > 4.3 4.4 4.4 4.0 3.6  CL 103 99   < > 104 103 102 103 95*  CO2 28 27   < > 21* '23 26 26 ' 27*  GLUCOSE 102 89   < > 215* 114* 98 95  --   BUN 22 38*   < > 21 28* 34* 38* 27*  CREATININE 1.61* 1.96*   < > 2.09* 1.65* 1.73* 1.52* 2.0*  CALCIUM 9.5 9.9   < > 9.2 9.0 9.1 9.6 9.5  MG  --   --   --  1.8  --    --   --   --   TSH 0.18* 25.54*  --   --   --   --   --   --    < > = values in this interval not displayed.   Liver Function Tests: Recent Labs    04/07/21 1457 05/09/21 0042 05/20/21 1159 06/24/21 0000  AST '14 16 10  ' --   ALT '15 11 8  ' --   ALKPHOS  --  66  --   --   BILITOT 0.7 1.1 0.7  --   PROT 6.9 6.2* 6.3  --   ALBUMIN  --  3.5  --  4.5   No results for input(s): LIPASE, AMYLASE in the last 8760 hours. No results for input(s): AMMONIA in the last 8760 hours. CBC: Recent Labs    04/07/21 1457 05/09/21 0042 05/10/21 0142 05/11/21 0338 05/20/21 1159 06/24/21 0000  WBC 8.8 8.9 9.1 6.7 7.4  --   NEUTROABS 4,462 6.8  --   --  4,181  --   HGB 13.7 12.3 11.9* 11.6* 12.5 12.9  HCT 43.0 39.6 36.7 36.2 39.4  --   MCV 86.2 88.6 85.7 86.0 85.7  --   PLT 195 151 167 165 165  --    Lipid Panel: Recent Labs    05/10/21 0142  CHOL 110  HDL 34*  LDLCALC 44  TRIG 160*  CHOLHDL 3.2   TSH: Recent Labs    12/07/20 1356 04/07/21 1457  TSH 0.18* 25.54*   A1C: Lab Results  Component Value Date   HGBA1C 5.8 (H) 03/18/2020     1. Acquired hypothyroidism -having a hard time being consistent with taking synthroid without food and causing increase stress on daughter who is her caregiver, okay to take with breakfast/other medication and we will monitor level and adjust as needed - TSH  2. Morbid obesity ([place]) -education provided on healthy weight loss through increase in physical activity and proper nutrition   3. Chronic congestive heart failure, unspecified heart failure type ([place]) Euvolemic, will continue current regimen, followed by cardiology.  - CMP with eGFR([facility])  4. Generalized anxiety disorder Controlled at this time, followed by psych. Will continue current regimen.   5. Coronary artery disease involving native coronary artery of native heart without angina pectoris Stable, without chest pain. Continues on ASA and metoprolol.  6. PAD (peripheral artery  disease) (HCC) Stable, without pain, continues on ASA and plavix.   7. PAF (paroxysmal atrial fibrillation) (HCC) Rate controlled, continues on metoprolol  - CBC with Differential/Platelet  8. Mood disorder ([place]) Stable at this time, continues recs per psych.  9. Thrombocytopenia, unspecified ([place]) Will follow up cbc and monitor.   10. Atherosclerosis of coronary artery bypass graft(s), unspecified, with other forms of angina pectoris ([place]) -continues on asa, plavix and crestor   11. OSA on CPAP Continue to monitor.   12. Skin lesion On wrist, will have dermatology remove  - Ambulatory referral to Dermatology   Return in about 6 months (around 04/13/2022) for routine follow up . [name]. [place], [facility] [phone]

## 2021-10-11 NOTE — Patient Instructions (Signed)
Can use Miralax 17 daily for constipation  ?

## 2021-10-12 ENCOUNTER — Encounter: Payer: Self-pay | Admitting: Psychiatry

## 2021-10-12 ENCOUNTER — Ambulatory Visit: Payer: Medicare HMO | Admitting: Psychiatry

## 2021-10-12 DIAGNOSIS — F39 Unspecified mood [affective] disorder: Secondary | ICD-10-CM | POA: Diagnosis not present

## 2021-10-12 DIAGNOSIS — F411 Generalized anxiety disorder: Secondary | ICD-10-CM | POA: Diagnosis not present

## 2021-10-12 LAB — CBC WITH DIFFERENTIAL/PLATELET
Absolute Monocytes: 602 cells/uL (ref 200–950)
Basophils Absolute: 43 cells/uL (ref 0–200)
Basophils Relative: 0.5 %
Eosinophils Absolute: 69 cells/uL (ref 15–500)
Eosinophils Relative: 0.8 %
HCT: 39.5 % (ref 35.0–45.0)
Hemoglobin: 12.5 g/dL (ref 11.7–15.5)
Lymphs Abs: 3345 cells/uL (ref 850–3900)
MCH: 26.1 pg — ABNORMAL LOW (ref 27.0–33.0)
MCHC: 31.6 g/dL — ABNORMAL LOW (ref 32.0–36.0)
MCV: 82.5 fL (ref 80.0–100.0)
MPV: 9.9 fL (ref 7.5–12.5)
Monocytes Relative: 7 %
Neutro Abs: 4541 cells/uL (ref 1500–7800)
Neutrophils Relative %: 52.8 %
Platelets: 206 10*3/uL (ref 140–400)
RBC: 4.79 10*6/uL (ref 3.80–5.10)
RDW: 13.8 % (ref 11.0–15.0)
Total Lymphocyte: 38.9 %
WBC: 8.6 10*3/uL (ref 3.8–10.8)

## 2021-10-12 LAB — COMPLETE METABOLIC PANEL WITH GFR
AG Ratio: 1.7 (calc) (ref 1.0–2.5)
ALT: 10 U/L (ref 6–29)
AST: 13 U/L (ref 10–35)
Albumin: 4.3 g/dL (ref 3.6–5.1)
Alkaline phosphatase (APISO): 80 U/L (ref 37–153)
BUN/Creatinine Ratio: 13 (calc) (ref 6–22)
BUN: 23 mg/dL (ref 7–25)
CO2: 32 mmol/L (ref 20–32)
Calcium: 9.9 mg/dL (ref 8.6–10.4)
Chloride: 98 mmol/L (ref 98–110)
Creat: 1.79 mg/dL — ABNORMAL HIGH (ref 0.60–1.00)
Globulin: 2.6 g/dL (calc) (ref 1.9–3.7)
Glucose, Bld: 102 mg/dL (ref 65–139)
Potassium: 4.8 mmol/L (ref 3.5–5.3)
Sodium: 140 mmol/L (ref 135–146)
Total Bilirubin: 1 mg/dL (ref 0.2–1.2)
Total Protein: 6.9 g/dL (ref 6.1–8.1)
eGFR: 28 mL/min/{1.73_m2} — ABNORMAL LOW (ref >=60)

## 2021-10-12 LAB — TSH: TSH: 2.47 mIU/L (ref 0.40–4.50)

## 2021-10-12 MED ORDER — DIVALPROEX SODIUM ER 250 MG PO TB24: 250.0000 mg | ORAL_TABLET | Freq: Every day | ORAL | 1 refills | Status: DC

## 2021-10-12 MED ORDER — ALPRAZOLAM 0.5 MG PO TABS: 0.5000 mg | ORAL_TABLET | Freq: Every day | ORAL | 2 refills | Status: DC | PRN

## 2021-10-12 MED ORDER — BUSPIRONE HCL 15 MG PO TABS: 15.0000 mg | ORAL_TABLET | Freq: Two times a day (BID) | ORAL | 1 refills | Status: DC

## 2021-10-12 MED ORDER — ESCITALOPRAM OXALATE 20 MG PO TABS: 20.0000 mg | ORAL_TABLET | Freq: Every day | ORAL | 0 refills | Status: DC

## 2021-10-12 NOTE — Progress Notes (Signed)
[name] [id number] Dec 21, 1940 81 y.o.  Subjective:   Patient ID:  Deanna [name] is a 81 y.o. (DOB 1941/03/20) female.  Chief Complaint:  Chief Complaint  Patient presents with   Follow-up    Anxiety, mood disturbance    HPI AMILY DEPP presents to the office today for follow-up of anxiety and mood disturbance. She is accompanied by her daughter.   Daughter reports that Buspar has been helpful for her anxiety. Pt agrees with this and reports that she has not had any recent anxiety. Daughter reports that pt will obsess about where the cat is. Daughter reports that pt is less obsessive about the cat and no longer has to physically lay eyes on the cat or check multiple times. Daughter reports that pt is no longer waking up with bad dreams. Pt reports that she has been having "good dreams" and no longer having nightmares. They report she is no longer having panic s/s. Denies excessive worry or rumination.   She reports that her mood has been improved overall. She reports that she occasionally feels sad in response to not being physically able to do things she used to. Denies irritability. Low energy and motivation. Daughter reports that pt will feel thirsty and not get up and get herself something to drink. Daughter reports that at times pt will not recognize steps involved with a desired outcome. Appetite has been good. She reports that her concentration is ok. She reports that she enjoys getting out and certain TV shows. She reports ST memory impairment. Denies SI.   Occ using Xanax 1/2 tab prn at night.  Past Psychiatric Medication Trials: Sertraline- initially helpful and then no longer effective Cymbalta- increased blood pressure Prozac-possible adverse reaction Lexapro Depakote Ativan-effective but caused excessive drowsiness Xanax-effective Aricept-anger Namenda- anger Gabapentin- Some improvement in pain and [place] Office Visit from  06/15/2021 in [facility] [place] from 03/07/2018 in [facility] [place] from 05/30/2014 in [facility]  Total Score (max 30 points ) '26 29 29      '$ PHQ2-9    Flowsheet Row Clinical Support from 04/15/2021 in [facility] [place] Visit from 05/06/2020 in [facility] [place] Visit from 11/29/2017 in [facility] Patient Outreach from 11/17/2015 in [place] Patient Outreach from 10/15/2015 in [place]  PHQ-2 Total Score 0 0 '6 1 1  '$ PHQ-9 Total Score -- -- 18 -- --      Flowsheet Row ED to Hosp-Admission (Discharged) from 05/09/2021 in [place] No Risk        Review of Systems:  Review of Systems  Eyes:        Visual impairment  Musculoskeletal:  Positive for back pain and gait problem.  Neurological:  Positive for tremors.       Improved tremor  Psychiatric/Behavioral:         Please refer to HPI   Medications: I have reviewed the patient's current medications.  Current Outpatient Medications  Medication Sig Dispense Refill   acetaminophen (TYLENOL) 325 MG tablet Take 325 mg by mouth in the morning and at bedtime. 1 by mouth in the morning and 1 by mouth in the pm.     ALPRAZolam (XANAX) 0.5 MG tablet Take 1 tablet (0.5 mg total) by mouth daily as needed for anxiety. 30 tablet 2  aspirin EC 81 MG tablet Take 81 mg by mouth at bedtime.      cholecalciferol (VITAMIN D) 400 units TABS tablet Take 400 Units by mouth daily.     clopidogrel (PLAVIX) 75 MG tablet TAKE 1 TABLET(75 MG) BY MOUTH DAILY 90 tablet 1   dorzolamide (TRUSOPT) 2 % ophthalmic solution Place 1 drop into both eyes 2 (two) times daily.     FARXIGA 10 MG TABS tablet TAKE 1 TABLET(10 MG) BY MOUTH DAILY 30 tablet 5   furosemide (LASIX) 40 MG tablet TAKE 1 TABLET(40 MG) BY MOUTH DAILY 90 tablet 1   gabapentin  (NEURONTIN) 100 MG capsule TAKE 2 CAPSULES(200 MG) BY MOUTH DAILY 60 capsule 5   latanoprost (XALATAN) 0.005 % ophthalmic solution Place 1 drop into both eyes at bedtime.     levothyroxine (SYNTHROID) 175 MCG tablet Take 1 tablet (175 mcg total) by mouth daily. Take at the same time daily separate from other medications 90 tablet 1   meclizine (ANTIVERT) 25 MG tablet Take 1 tablet (25 mg total) by mouth as needed for dizziness. 30 tablet 0   metoprolol tartrate (LOPRESSOR) 25 MG tablet Take 12.5 mg by mouth 2 (two) times daily.     MYRBETRIQ 50 MG TB24 tablet 50 mg daily.     pantoprazole (PROTONIX) 40 MG tablet TAKE 1 TABLET EVERY DAY 90 tablet 1   busPIRone (BUSPAR) 15 MG tablet Take 1 tablet (15 mg total) by mouth 2 (two) times daily. 180 tablet 1   divalproex (DEPAKOTE ER) 250 MG 24 hr tablet Take 1 tablet (250 mg total) by mouth at bedtime. 90 tablet 1   escitalopram (LEXAPRO) 20 MG tablet Take 1 tablet (20 mg total) by mouth daily. 90 tablet 0   nitroGLYCERIN (NITROSTAT) 0.4 MG SL tablet Place 1 tablet (0.4 mg total) under the tongue every 5 (five) minutes x 3 doses as needed for chest pain. 25 tablet 1   rosuvastatin (CRESTOR) 40 MG tablet TAKE 1 TABLET(40 MG) BY MOUTH DAILY 90 tablet 3   tiZANidine (ZANAFLEX) 2 MG tablet Take 1 tablet (2 mg total) by mouth every 6 (six) hours as needed for muscle spasms. 30 tablet 5   vitamin B-12 (CYANOCOBALAMIN) 1000 MCG tablet Take 1,000 mcg by mouth daily.     No current facility-administered medications for this visit.    Medication Side Effects: None  Allergies:  Allergies  Allergen Reactions   Donepezil Other (See Comments)    Altered mood, aggression, and caused anger   Duloxetine Hcl     Increased confusion and memory concerns    Meloxicam     Pt suspects that this medicine causes fluid on her lungs.    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    Vicodin [Hydrocodone-Acetaminophen] Other (See Comments)    Delirium, confusion, and toxic dementia    Past Medical History:  Diagnosis Date   Anxiety    Bacteremia, escherichia coli 04/27/2015   Brachial-basilar insufficiency syndrome    CAD (coronary artery disease)    Celiac artery stenosis (HCC)    Chronic bilateral low back pain without sciatica    Chronic combined systolic (congestive) and diastolic (congestive) heart failure ([place]) 11/14/2017   CKD (chronic kidney disease), stage IV (HCC)    Cognitive impairment    Colon polyps    Community acquired pneumonia    Depression  Diverticulosis    Dysuria    Encephalomalacia    GAD (generalized anxiety disorder)    GERD (gastroesophageal reflux disease)    Glaucoma    Hearing loss    Hemorrhoids    Hypertension    Hypothyroidism    Incontinence    Mixed hyperlipidemia    Myocardial infarction (HCC)    OSA on CPAP    PAF (paroxysmal atrial fibrillation) (HCC)    Peripheral arterial disease ([place])    Pneumonitis 04/26/2015   Pyelonephritis due to Escherichia coli 04/28/2015   Sepsis ([place])    Spondylosis of lumbar region without myelopathy or radiculopathy    TBI (traumatic brain injury)    Urticaria    Vertigo, benign positional     Past Medical History, Surgical history, Social history, and Family history were reviewed and updated as appropriate.   Please see review of systems for further details on the patient's review from today.   Objective:   Physical Exam:  There were no vitals taken for this visit.  Physical Exam Constitutional:      General: She is not in acute distress. Musculoskeletal:        General: No deformity.  Neurological:     Mental Status: She is alert and oriented to person, place, and time.     Coordination: Coordination normal.  Psychiatric:        Attention and Perception: Attention and perception normal. She does not perceive auditory or visual hallucinations.         Mood and Affect: Mood normal. Mood is not anxious or depressed. Affect is not labile, blunt, angry or inappropriate.        Speech: Speech normal.        Behavior: Behavior normal.        Thought Content: Thought content normal. Thought content is not paranoid or delusional. Thought content does not include homicidal or suicidal ideation. Thought content does not include homicidal or suicidal plan.        Cognition and Memory: She exhibits impaired recent memory.        Judgment: Judgment normal.     Comments: Insight intact    Lab Review:     Component Value Date/Time   NA 140 10/11/2021 1539   NA 143 06/24/2021 0000   K 4.8 10/11/2021 1539   CL 98 10/11/2021 1539   CO2 32 10/11/2021 1539   GLUCOSE 102 10/11/2021 1539   BUN 23 10/11/2021 1539   BUN 27 (A) 06/24/2021 0000   CREATININE 1.79 (H) 10/11/2021 1539   CALCIUM 9.9 10/11/2021 1539   PROT 6.9 10/11/2021 1539   ALBUMIN 4.5 06/24/2021 0000   AST 13 10/11/2021 1539   ALT 10 10/11/2021 1539   ALKPHOS 66 05/09/2021 0042   BILITOT 1.0 10/11/2021 1539   GFRNONAA 30 (L) 05/11/2021 0338   GFRNONAA 30 (L) 12/07/2020 1356   GFRAA 35 (L) 12/07/2020 1356       Component Value Date/Time   WBC 8.6 10/11/2021 1539   RBC 4.79 10/11/2021 1539   HGB 12.5 10/11/2021 1539   HGB 10.2 (L) 04/22/2020 1118   HCT 39.5 10/11/2021 1539   HCT 30.6 (L) 04/22/2020 1118   PLT 206 10/11/2021 1539   PLT 108 (L) 04/22/2020 1118   MCV 82.5 10/11/2021 1539   MCV 86 04/22/2020 1118   MCH 26.1 (L) 10/11/2021 1539   MCHC 31.6 (L) 10/11/2021 1539   RDW 13.8 10/11/2021 1539   RDW 13.5 04/22/2020 1118  LYMPHSABS 3,345 10/11/2021 1539   MONOABS 0.5 05/09/2021 0042   EOSABS 69 10/11/2021 1539   BASOSABS 43 10/11/2021 1539    No results found for: POCLITH, LITHIUM   No results found for: PHENYTOIN, PHENOBARB, VALPROATE, CBMZ   .res Assessment: Plan:   Pt seen for 30 minutes and time spent reviewing lab results and also discussing that  she may be having difficulty recalling steps involved with certain processes and it may be helpful for daughter to provide cues with tasks that require multiple steps, such as preparing food.  Will continue current plan of care since target signs and symptoms are well controlled without any tolerability issues. Continue Buspar 15 mg po BID for anxiety.  Continue Lexapro 20 mg po qd for anxiety and depression.  Continue Depakote ER 250 mg po QHS for mood s/s.  Continue Alprazolam 0.5 mg po qd prn anxiety.  Pt to follow-up in 6 months or sooner if clinically indicated.    Cyanna was seen today for follow-up.  Diagnoses and all orders for this visit:  Mood disorder ([place]) -     divalproex (DEPAKOTE ER) 250 MG 24 hr tablet; Take 1 tablet (250 mg total) by mouth at bedtime. -     escitalopram (LEXAPRO) 20 MG tablet; Take 1 tablet (20 mg total) by mouth daily.  Generalized anxiety disorder -     busPIRone (BUSPAR) 15 MG tablet; Take 1 tablet (15 mg total) by mouth 2 (two) times daily. -     escitalopram (LEXAPRO) 20 MG tablet; Take 1 tablet (20 mg total) by mouth daily.     Please see After Visit Summary for patient specific instructions.  Future Appointments  Date Time Provider [place]  10/20/2021  9:00 AM Jon Billings, RN [facility] None  11/01/2021 11:20 AM Deberah Pelton, NP [facility]  04/19/2022  2:45 PM Lauree Chandler, NP [facility] None  04/25/2022  2:45 PM Lauree Chandler, NP [facility] None  06/15/2022 11:30 AM Shawn Route Coralee Pesa, PA-C [facility] None    No orders of the defined types were placed in this encounter.   -------------------------------

## 2021-10-14 ENCOUNTER — Other Ambulatory Visit: Payer: Self-pay | Admitting: Nurse Practitioner

## 2021-10-14 DIAGNOSIS — E039 Hypothyroidism, unspecified: Secondary | ICD-10-CM

## 2021-10-14 NOTE — Telephone Encounter (Signed)
Patient has request refill on medication "Farxiga". Patient last refill dated 03/22/2021. I tried to send patient medication in to pharmacy. But warning for selecting alternative medication appeared. Medication routed to PCP [name], [name], NP to make final decision.  ?

## 2021-10-20 ENCOUNTER — Other Ambulatory Visit: Payer: Self-pay

## 2021-10-20 NOTE — Patient Outreach (Signed)
[place] [facility]) Care Management ? ?10/20/2021 ? ?[name] ?1941-05-21 ?[id number] ? ? ?Telephone  call to daughter [name].  No answer.  Voice mail full.   ? ?Plan: RN CM will attempt again in the month of April and send letter.   ? ?[name], RN, MSN ?[facility] Care Management ?Care Management Coordinator ?Direct Line [phone] ?Toll Free: [phone]  ?Fax: [phone] ? ?

## 2021-10-25 NOTE — Progress Notes (Deleted)
? ?Cardiology Clinic Note  ? ?Patient Name: Deanna [name] ?Date of Encounter: 10/25/2021 ? ?Primary Care Provider:  Lauree Chandler, NP ?Primary Cardiologist:  Quay Burow, MD ? ?Patient Profile  ?  ?Deanna [name] 81 year old female presents the clinic today for a follow-up evaluation of her essential hypertension, coronary artery disease status post CABG, critical lower limb ischemia, PAD, GERD, hypothyroidism, and hyperlipidemia. ? ?Past Medical History  ?  ?Past Medical History:  ?Diagnosis Date  ? Anxiety   ? Bacteremia, escherichia coli 04/27/2015  ? Brachial-basilar insufficiency syndrome   ? CAD (coronary artery disease)   ? Celiac artery stenosis (HCC)   ? Chronic bilateral low back pain without sciatica   ? Chronic combined systolic (congestive) and diastolic (congestive) heart failure ([place]) 11/14/2017  ? CKD (chronic kidney disease), stage IV ([place])   ? Cognitive impairment   ? Colon polyps   ? Community acquired pneumonia   ? Depression   ? Diverticulosis   ? Dysuria   ? Encephalomalacia   ? GAD (generalized anxiety disorder)   ? GERD (gastroesophageal reflux disease)   ? Glaucoma   ? Hearing loss   ? Hemorrhoids   ? Hypertension   ? Hypothyroidism   ? Incontinence   ? Mixed hyperlipidemia   ? Myocardial infarction [facility])   ? OSA on CPAP   ? PAF (paroxysmal atrial fibrillation) ([place])   ? Peripheral arterial disease ([place])   ? Pneumonitis 04/26/2015  ? Pyelonephritis due to Escherichia coli 04/28/2015  ? Sepsis ([place])   ? Spondylosis of lumbar region without myelopathy or radiculopathy   ? TBI (traumatic brain injury)   ? Urticaria   ? Vertigo, benign positional   ? ?Past Surgical History:  ?Procedure Laterality Date  ? ABDOMINAL AORTOGRAM W/LOWER EXTREMITY N/A 08/22/2017  ? Procedure: ABDOMINAL AORTOGRAM W/LOWER EXTREMITY;  Surgeon: Serafina Mitchell, MD;  Location: [place] CV LAB;  Service: Cardiovascular;  Laterality: N/A;  ? BILATERAL UPPER EXTREMITY ANGIOGRAM N/A 09/11/2012  ? Procedure:  BILATERAL UPPER EXTREMITY ANGIOGRAM;  Surgeon: Serafina Mitchell, MD;  Location: [facility] CATH LAB;  Service: Cardiovascular;  Laterality: N/A;  ? BIOPSY  03/25/2020  ? Procedure: BIOPSY;  Surgeon: Irving Copas., MD;  Location: [place];  Service: Gastroenterology;;  ? carotid duplex doppler Bilateral 09/03/2012, 11/03/2011  ? 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 patent left common carotid- subclavian artery bypass with turbulent flow noted at the anastomosis with velocities of 295 cm/s  ? CAROTID-SUBCLAVIAN BYPASS GRAFT  12/15/2011  ? Procedure: BYPASS GRAFT CAROTID-SUBCLAVIAN;  Surgeon: Serafina Mitchell, MD;  Location: [facility] OR;  Service: Vascular;  Laterality: Left;  Left Carotid subclavian bypass  ? CORONARY ANGIOPLASTY    ? CORONARY ARTERY BYPASS GRAFT    ? CORONARY ATHERECTOMY N/A 03/19/2020  ? Procedure: CORONARY ATHERECTOMY;  Surgeon: Jettie Booze, MD;  Location: [place] CV LAB;  Service: Cardiovascular;  Laterality: N/A;  ? CORONARY STENT INTERVENTION N/A 03/19/2020  ? Procedure: CORONARY STENT INTERVENTION;  Surgeon: Jettie Booze, MD;  Location: [place] CV LAB;  Service: Cardiovascular;  Laterality: N/A;  ? DOPPLER ECHOCARDIOGRAPHY  05/27/2010, 09/17/2008  ? Mild Proximal septal thickening is noted. Left ventricular systolic functions is normal ejection fraction =>55%. the aortic valve appears to be mildly sclerotic   ? ESOPHAGOGASTRODUODENOSCOPY (EGD) WITH PROPOFOL N/A 03/25/2020  ? Procedure: ESOPHAGOGASTRODUODENOSCOPY (EGD) WITH PROPOFOL;  Surgeon: Irving Copas., MD;  Location: [place];  Service: Gastroenterology;  Laterality: N/A;  ? fem-fem bypass graft  1999  ? holter monitor  01/21/2008  ? The predominant rhythm was normal sinus rhythm. Minimum heartrate of 63 bpm at +01:00, maximum heartrate of 105 bpm at +  10:35; and the average heartrate of 75 bpm. Ventricular ectopic activity totaled 1270: Multifocal; 866-PVC's and 404-VEs             ? JOINT REPLACEMENT    ? Left knee  ? LEFT HEART CATH AND CORS/GRAFTS ANGIOGRAPHY N/A 03/19/2020  ? Procedure: LEFT HEART CATH AND CORS/GRAFTS ANGIOGRAPHY;  Surgeon: Jettie Booze, MD;  Location: [place] CV LAB;  Service: Cardiovascular;  Laterality: N/A;  ? LEFT HEART CATHETERIZATION WITH CORONARY/GRAFT ANGIOGRAM N/A 12/21/2011  ? Procedure: LEFT HEART CATHETERIZATION WITH Beatrix Fetters;  Surgeon: Leonie Man, MD;  Location: [place] CATH LAB;  Service: Cardiovascular;  Laterality: N/A;  ? NM MYOCAR PERF EJECTION FRACTION  09/22/2009, 07/03/2007  ? the post stress myocardial perfusion images show a normal pattern of perfusion is all regions. The post-stress ejection fraction is 68 %. no significant wall motion abnormalities noted. This is a low risk scan.  ? PERIPHERAL VASCULAR INTERVENTION  08/22/2017  ? Procedure: PERIPHERAL VASCULAR INTERVENTION;  Surgeon: Serafina Mitchell, MD;  Location: [place] CV LAB;  Service: Cardiovascular;;  Fem/Fem Graft  ? REPLACEMENT TOTAL KNEE  05-2011  ? UNILATERAL UPPER EXTREMEITY ANGIOGRAM Left 11/15/2011  ? Procedure: UNILATERAL UPPER Anselmo Rod;  Surgeon: Lorretta Harp, MD;  Location[facility] C[facility] LAB;  Service: Cardiovascular;  Laterality: Left;  ? ? ?Allergies ? ?Allergies  ?Allergen Reactions  ? Donepezil Other (See Comments)  ?  Altered mood, aggression, and caused anger  ? Duloxetine Hcl   ?  Increased confusion and memory concerns   ? Meloxicam   ?  Pt suspects that this medicine causes fluid on her lungs.   ? Memantine Other (See Comments)  ?  Caused severe aggression  ? Oxycodone Other (See Comments)  ?  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 Vicodin [Hydrocodone-Acetaminophen] Other (See Comments)  ?  Delirium, confusion, and toxic dementia  ? ? ?History of Present Illness  ?  ?Ms. Popescu has a  PMH of essential hypertension, coronary artery disease status post CABG, critical lower limb ischemia, PAD, GERD, hypothyroidism, obesity, anxiety, depression, thrombocytopenia, chronic pain, and hyperlipidemia.  She had an MI with PTCA to her left anterior descending in 1994, PTCA to LCx in 1995 and underwent CABG in 1999.  Due to her PAD she had left to right femorofemoral crossover and has also had left subclavian artery stenosis and left common carotid to subclavian artery bypass. ? ?She presented to [facility] on 03/18/2020 and was discharged on 04/01/2020.  She presented to the hospital with chest pain and was found to have elevated troponins.  Her last cardiac catheterization 2013 via her left common femoral artery below femorofemoral bypass anastomosis showed LIMA-LAD with occluded LT ICA, SVG to diagonal patent, SVG to OM patent, SVG to RCA 100% thrombotic.  There was a large radiopaque filling defect just prior to her occlusion site.  This was not safe PCI for fear of distal embolization.  A widely patent left common and external iliac with brisk flow L-R femorofemoral bypass graft was noted. ? ?She also indicated that she was having shortness of breath and back pain.  Her chest pain was  described as burning pain  Her back  pain had increased.  EKG showed sinus tach at 103, flattened T waves, inferior lateral and on later EKGs increase in ST depression.  She underwent cardiac catheterization on 03/19/2020 which showed ostial LAD-proximal LAD lesion 99% LIMA-LAD known to be atrertic.  She underwent orbital arthrectomy and received DES x1.  She also has mid LAD 100% stenosis however, arthrectomy and angioplasty could not restore flow.  Mid circumflex 90% stenosed SVG-OM patent.  Proximal RCA lesion 100% stenosed SVG-PDA occluded LVEF 25-35%.  Her echocardiogram 03/19/2020 showed an LVEF of 35-40% G2 DD, mildly reduced right ventricular function and mild mitral valve regurgitation. ? ?She presented to  the clinic 04/22/2020 for follow-up evaluation with her daughter and stated she was feeling much better.  She continued to be on oxygen 1.5 L nasal cannula.  Her endurance had improved as well as her strength.

## 2021-10-29 ENCOUNTER — Emergency Department (HOSPITAL_COMMUNITY): Payer: Medicare HMO

## 2021-10-29 ENCOUNTER — Emergency Department (HOSPITAL_COMMUNITY)
Admission: EM | Admit: 2021-10-29 | Discharge: 2021-10-30 | Discharge status: Against Medical Advice | Payer: Medicare HMO | Attending: Emergency Medicine | Admitting: Emergency Medicine

## 2021-10-29 ENCOUNTER — Other Ambulatory Visit: Payer: Self-pay

## 2021-10-29 ENCOUNTER — Encounter (HOSPITAL_COMMUNITY): Payer: Self-pay | Admitting: Emergency Medicine

## 2021-10-29 DIAGNOSIS — R079 Chest pain, unspecified: Secondary | ICD-10-CM | POA: Insufficient documentation

## 2021-10-29 DIAGNOSIS — M79602 Pain in left arm: Secondary | ICD-10-CM | POA: Insufficient documentation

## 2021-10-29 DIAGNOSIS — Z951 Presence of aortocoronary bypass graft: Secondary | ICD-10-CM | POA: Insufficient documentation

## 2021-10-29 DIAGNOSIS — Z5321 Procedure and treatment not carried out due to patient leaving prior to being seen by health care provider: Secondary | ICD-10-CM | POA: Diagnosis not present

## 2021-10-29 DIAGNOSIS — R0789 Other chest pain: Secondary | ICD-10-CM | POA: Diagnosis not present

## 2021-10-29 DIAGNOSIS — I10 Essential (primary) hypertension: Secondary | ICD-10-CM | POA: Diagnosis not present

## 2021-10-29 DIAGNOSIS — M25512 Pain in left shoulder: Secondary | ICD-10-CM | POA: Insufficient documentation

## 2021-10-29 DIAGNOSIS — M542 Cervicalgia: Secondary | ICD-10-CM | POA: Diagnosis not present

## 2021-10-29 DIAGNOSIS — M792 Neuralgia and neuritis, unspecified: Secondary | ICD-10-CM | POA: Diagnosis not present

## 2021-10-29 DIAGNOSIS — M79603 Pain in arm, unspecified: Secondary | ICD-10-CM | POA: Diagnosis not present

## 2021-10-29 LAB — CBC
HCT: 40.1 % (ref 36.0–46.0)
Hemoglobin: 12.7 g/dL (ref 12.0–15.0)
MCH: 26.6 pg (ref 26.0–34.0)
MCHC: 31.7 g/dL (ref 30.0–36.0)
MCV: 83.9 fL (ref 80.0–100.0)
Platelets: 175 10*3/uL (ref 150–400)
RBC: 4.78 MIL/uL (ref 3.87–5.11)
RDW: 14.8 % (ref 11.5–15.5)
WBC: 7.2 10*3/uL (ref 4.0–10.5)
nRBC: 0 % (ref 0.0–0.2)

## 2021-10-29 LAB — BASIC METABOLIC PANEL
Anion gap: 10 (ref 5–15)
BUN: 26 mg/dL — ABNORMAL HIGH (ref 8–23)
CO2: 26 mmol/L (ref 22–32)
Calcium: 9.2 mg/dL (ref 8.9–10.3)
Chloride: 103 mmol/L (ref 98–111)
Creatinine, Ser: 1.69 mg/dL — ABNORMAL HIGH (ref 0.44–1.00)
GFR, Estimated: 31 mL/min — ABNORMAL LOW (ref >60)
Glucose, Bld: 115 mg/dL — ABNORMAL HIGH (ref 70–99)
Potassium: 3.6 mmol/L (ref 3.5–5.1)
Sodium: 139 mmol/L (ref 135–145)

## 2021-10-29 LAB — TROPONIN I (HIGH SENSITIVITY)
Troponin I (High Sensitivity): 21 ng/L — ABNORMAL HIGH (ref <18)
Troponin I (High Sensitivity): 28 ng/L — ABNORMAL HIGH (ref <18)

## 2021-10-29 NOTE — ED Provider Triage Note (Signed)
Emergency Medicine Provider Triage Evaluation Note ? ?Deanna [name] , a 81 y.o. female  was evaluated in triage.  Pt complains of left shoulder and arm pain.  She states that yesterday morning she woke up with significant left shoulder and left arm pain.  She says it initially started in the left upper side of her chest.  The pain is now radiating down the left side of her arm.  It is constant.  She says she has had similar symptoms before where she would wake up and have the symptoms, however they always go away within a short period of time.  She does state that she thinks it feels different than normal.  She denies any chest pain right now.  She does have a history of carotid artery stenosis.  She denies any injury to the arm.  She denies any weakness or numbness.  She denies rest of breath, nausea, vomiting, or dizziness. Denies any trauma to the arm or sleeping on it differnently. ? ?Review of Systems  ?Positive: Left shoulder, back, arm pain ?Negative:  ? ?Physical Exam  ?BP (!) 161/93 (BP Location: Right Arm)   Pulse 94   Temp 99.3 ?F (37.4 ?C) (Oral)   Resp 18   Ht '5\' 1"'$  (1.549 m)   Wt 88.5 kg   SpO2 93%   BMI 36.84 kg/m?  ?Gen:   Awake, no distress   ?Resp:  Normal effort  ?MSK:   Moves extremities without difficulty  ?Other:  No C-spine midline tenderness to palpation.  Pain is reproducible on the left scapular region and all the way down the left arm.  She is notably in distress due to pain. ? ?Medical Decision Making  ?Medically screening exam initiated at 5:00 PM.  Appropriate orders placed.  Deanna [name] was informed that the remainder of the evaluation will be completed by another provider, this initial triage assessment does not replace that evaluation, and the importance of remaining in the ED until their evaluation is complete. ? ? ?  ?Adolphus Birchwood, PA-C ?10/29/21 1701 ? ?

## 2021-10-29 NOTE — ED Triage Notes (Signed)
Pt arrives from home with daughter at her side. Patient with complaints of 9/10 pain that travels from neck all the way down the arm to the hand on the left side. This started yesterday morning. Pt with hx of carotid bypass to same side a few years ago with no complications to daughter or patients knowledge. Pt describes the pain as an aching. Pt denies chest pain, SHOB, n/v/d. Strong pulse palpated and full sensation noted.   ?

## 2021-10-31 ENCOUNTER — Emergency Department (HOSPITAL_COMMUNITY)
Admission: EM | Admit: 2021-10-31 | Discharge: 2021-10-31 | Disposition: A | Discharge status: Home / Self Care | Payer: Medicare HMO | Attending: Emergency Medicine | Admitting: Emergency Medicine

## 2021-10-31 ENCOUNTER — Other Ambulatory Visit: Payer: Self-pay

## 2021-10-31 ENCOUNTER — Encounter (HOSPITAL_COMMUNITY): Payer: Self-pay | Admitting: Emergency Medicine

## 2021-10-31 DIAGNOSIS — R0789 Other chest pain: Secondary | ICD-10-CM | POA: Diagnosis not present

## 2021-10-31 DIAGNOSIS — I509 Heart failure, unspecified: Secondary | ICD-10-CM | POA: Insufficient documentation

## 2021-10-31 DIAGNOSIS — N189 Chronic kidney disease, unspecified: Secondary | ICD-10-CM | POA: Insufficient documentation

## 2021-10-31 DIAGNOSIS — I2510 Atherosclerotic heart disease of native coronary artery without angina pectoris: Secondary | ICD-10-CM | POA: Diagnosis not present

## 2021-10-31 DIAGNOSIS — E039 Hypothyroidism, unspecified: Secondary | ICD-10-CM | POA: Diagnosis not present

## 2021-10-31 DIAGNOSIS — Z7901 Long term (current) use of anticoagulants: Secondary | ICD-10-CM | POA: Diagnosis not present

## 2021-10-31 DIAGNOSIS — Z7982 Long term (current) use of aspirin: Secondary | ICD-10-CM | POA: Diagnosis not present

## 2021-10-31 DIAGNOSIS — M792 Neuralgia and neuritis, unspecified: Secondary | ICD-10-CM | POA: Insufficient documentation

## 2021-10-31 DIAGNOSIS — M79603 Pain in arm, unspecified: Secondary | ICD-10-CM | POA: Diagnosis not present

## 2021-10-31 DIAGNOSIS — Z79899 Other long term (current) drug therapy: Secondary | ICD-10-CM | POA: Diagnosis not present

## 2021-10-31 DIAGNOSIS — I10 Essential (primary) hypertension: Secondary | ICD-10-CM | POA: Diagnosis not present

## 2021-10-31 DIAGNOSIS — I130 Hypertensive heart and chronic kidney disease with heart failure and stage 1 through stage 4 chronic kidney disease, or unspecified chronic kidney disease: Secondary | ICD-10-CM | POA: Diagnosis not present

## 2021-10-31 DIAGNOSIS — M79602 Pain in left arm: Secondary | ICD-10-CM | POA: Diagnosis present

## 2021-10-31 MED ORDER — GABAPENTIN 100 MG PO CAPS: 100.0000 mg | ORAL_CAPSULE | Freq: Two times a day (BID) | ORAL | 1 refills | Status: DC | PRN

## 2021-10-31 MED ORDER — GABAPENTIN 100 MG PO CAPS
100.0000 mg | ORAL_CAPSULE | Freq: Once | ORAL | Status: AC
  Administered 2021-10-31: 100 mg via ORAL
  Filled 2021-10-31: qty 1

## 2021-10-31 NOTE — Discharge Instructions (Signed)
There is no sign of there being a problem with your heart today.  This seems like nerve pain.  Make sure you are not resting your elbow on anything because it may be irritating the nerve. ?

## 2021-10-31 NOTE — ED Triage Notes (Signed)
Pt to triage via [facility] from home.  Reports L arm pain x 3 days.  No swelling.  No injury.  Pain worse with movement.  Pt seen in ED on 3/24 for same and LWBS.  Taking Ibuprofen and Tylenol without relief.  CMS intact. ?

## 2021-10-31 NOTE — ED Provider Notes (Signed)
?[facility] ?Provider Note ? ? ?CSN: [id number] ?Arrival date & time: 10/31/21  1308 ? ?  ? ?History ? ?Chief Complaint  ?Patient presents with  ? Arm Pain  ? ? ?Deanna [name] is a 81 y.o. female. ? ?Patient is a 81 year old female with multiple medical problems including hypertension, hypothyroidism, CAD status post MI, CHF, paroxysmal atrial fibrillation, CKD who is presenting today with complaint of left arm pain.  This pain has been present for about 3 days but she reports it is excruciating.  It starts in her left elbow and goes down to her fingertips.  It feels like a deep tooth ache but more severe.  It is not made any worse by moving her arm a certain way.  She denies any radiation of pain up into her shoulder or her neck.  She denies any known trauma and reports she has never had pain like this before.  She has not noticed any swelling or color changes in her arm.  She has tried over-the-counter Tylenol and ibuprofen without improvement.  She cannot remember a time where she has rested her elbow for prolonged period of time on a table and denies any numbness or tingling it is just pain. ? ?The history is provided by the patient and medical records.  ?Arm Pain ? ? ?  ? ?Home Medications ?Prior to Admission medications   ?Medication Sig Start Date End Date Taking? Authorizing Provider  ?gabapentin (NEURONTIN) 100 MG capsule Take 1 capsule (100 mg total) by mouth 2 (two) times daily as needed (for arm pain). 10/31/21  Yes Blanchie Dessert, MD  ?levothyroxine (SYNTHROID) 175 MCG tablet TAKE 1 TABLET BY MOUTH DAILY. TAKE AT THE SAME TIME DAILY SEPARATE FROM OTHER MEDICATIONS 10/14/21   Lauree Chandler, NP  ?acetaminophen (TYLENOL) 325 MG tablet Take 325 mg by mouth in the morning and at bedtime. 1 by mouth in the morning and 1 by mouth in the pm.    [provider]  ?ALPRAZolam Duanne Moron) 0.5 MG tablet Take 1 tablet (0.5 mg total) by mouth daily as needed for  anxiety. 12/07/21   Thayer Headings, PMHNP  ?aspirin EC 81 MG tablet Take 81 mg by mouth at bedtime.     [provider]  ?busPIRone (BUSPAR) 15 MG tablet Take 1 tablet (15 mg total) by mouth 2 (two) times daily. 10/12/21 01/10/22  Thayer Headings, [place]  ?cholecalciferol (VITAMIN D) 400 units TABS tablet Take 400 Units by mouth daily.    [provider]  ?clopidogrel (PLAVIX) 75 MG tablet TAKE 1 TABLET(75 MG) BY MOUTH DAILY 08/16/21   Lauree Chandler, NP  ?divalproex (DEPAKOTE ER) 250 MG 24 hr tablet Take 1 tablet (250 mg total) by mouth at bedtime. 10/12/21   Thayer Headings, PMHNP  ?dorzolamide (TRUSOPT) 2 % ophthalmic solution Place 1 drop into both eyes 2 (two) times daily.    [provider]  ?escitalopram (LEXAPRO) 20 MG tablet Take 1 tablet (20 mg total) by mouth daily. 10/12/21   Thayer Headings, [place]  ?FARXIGA 10 MG TABS tablet TAKE 1 TABLET(10 MG) BY MOUTH DAILY 10/14/21   Lauree Chandler, NP  ?furosemide (LASIX) 40 MG tablet TAKE 1 TABLET(40 MG) BY MOUTH DAILY 05/20/21   Ngetich, Dinah C, NP  ?latanoprost (XALATAN) 0.005 % ophthalmic solution Place 1 drop into both eyes at bedtime.    [provider]  ?meclizine (ANTIVERT) 25 MG tablet Take 1 tablet (25 mg total) by mouth as  needed for dizziness. 11/30/18   Lauree Chandler, NP  ?metoprolol tartrate (LOPRESSOR) 25 MG tablet Take 12.5 mg by mouth 2 (two) times daily.    [provider]  ?MYRBETRIQ 50 MG TB24 tablet 50 mg daily. 05/24/21   [provider]  ?nitroGLYCERIN (NITROSTAT) 0.4 MG SL tablet Place 1 tablet (0.4 mg total) under the tongue every 5 (five) minutes x 3 doses as needed for chest pain. 05/20/21   Ngetich, Dinah C, NP  ?pantoprazole (PROTONIX) 40 MG tablet TAKE 1 TABLET EVERY DAY 09/20/21   Lauree Chandler, NP  ?rosuvastatin (CRESTOR) 40 MG tablet TAKE 1 TABLET(40 MG) BY MOUTH DAILY 10/01/20   Lauree Chandler, NP  ?tiZANidine (ZANAFLEX) 2 MG tablet Take 1 tablet (2 mg total) by mouth  every 6 (six) hours as needed for muscle spasms. 07/01/19   Ngetich, Dinah C, NP  ?vitamin B-12 (CYANOCOBALAMIN) 1000 MCG tablet Take 1,000 mcg by mouth daily.    [provider]  ?   ? ?Allergies    ?Donepezil, Duloxetine hcl, Meloxicam, Memantine, Oxycodone, and Vicodin [hydrocodone-acetaminophen]   ? ?Review of Systems   ?Review of Systems ? ?Physical Exam ?Updated Vital Signs ?BP 128/64   Pulse 87   Temp 98.1 ?F (36.7 ?C) (Oral)   Resp 14   SpO2 95%  ?Physical Exam ?Vitals and nursing note reviewed.  ?Constitutional:   ?   General: She is not in acute distress. ?   Appearance: She is well-developed.  ?HENT:  ?   Head: Normocephalic and atraumatic.  ?Eyes:  ?   Pupils: Pupils are equal, round, and reactive to light.  ?Cardiovascular:  ?   Rate and Rhythm: Normal rate and regular rhythm.  ?   Pulses: Normal pulses.  ?   Heart sounds: Normal heart sounds. No murmur heard. ?  No friction rub.  ?Pulmonary:  ?   Effort: Pulmonary effort is normal.  ?   Breath sounds: Normal breath sounds. No wheezing or rales.  ?Abdominal:  ?   General: Bowel sounds are normal. There is no distension.  ?   Palpations: Abdomen is soft.  ?   Tenderness: There is no abdominal tenderness. There is no guarding or rebound.  ?Musculoskeletal:     ?   General: No tenderness. Normal range of motion.  ?   Cervical back: Normal range of motion and neck supple. No tenderness.  ?   Comments: No edema noted in the left upper extremity.  Full range of motion at the elbow and the shoulder without discomfort  ?Skin: ?   General: Skin is warm and dry.  ?   Capillary Refill: Capillary refill takes less than 2 seconds.  ?   Findings: No rash.  ?Neurological:  ?   Mental Status: She is alert and oriented to person, place, and time. Mental status is at baseline.  ?   Cranial Nerves: No cranial nerve deficit.  ?   Comments: 5 out of 5 hand grip on the left side, 5 out of 5 bicep and tricep.  Full range of motion of the arm.  ?Psychiatric:     ?    Behavior: Behavior normal.  ? ? ?ED Results / Procedures / Treatments   ?Labs ?(all labs ordered are listed, but only abnormal results are displayed) ?Labs Reviewed - No data to display ? ?EKG ?EKG Interpretation ? ?Date/Time:  Sunday October 31 2021 11:10:41 EDT ?Ventricular Rate:  85 ?PR Interval:  188 ?QRS Duration: 82 ?  QT Interval:  406 ?QTC Calculation: 483 ?R Axis:   30 ?Text Interpretation: Normal sinus rhythm Cannot rule out Anterior infarct , age undetermined ST & T wave abnormality, consider lateral ischemia No significant change since last tracing When compared with ECG of 29-Oct-2021 16:52, PREVIOUS ECG IS PRESENT Confirmed by Blanchie Dessert [phone]) on 10/31/2021 11:30:22 AM ? ?Radiology ?DG Chest 1 View ? ?Result Date: 10/29/2021 ?CLINICAL DATA:  Neck pain radiating to left arm and hand, history of carotid bypass surgery EXAM: CHEST  1 VIEW COMPARISON:  05/10/2021 FINDINGS: Single frontal view of the chest demonstrates stable postsurgical changes from CABG. Cardiac silhouette is unremarkable. Stable aortic atherosclerosis. No airspace disease, effusion, or pneumothorax. Stable degenerative changes of the bilateral shoulders. Stable surgical clips within the left supraclavicular region. IMPRESSION: 1. Stable exam, no acute process. Electronically Signed   By: Randa Ngo M.D.   On: 10/29/2021 17:48   ? ?Procedures ?Procedures  ? ? ?Medications Ordered in ED ?Medications  ?gabapentin (NEURONTIN) capsule 100 mg (100 mg Oral Given 10/31/21 1054)  ? ? ?ED Course/ Medical Decision Making/ A&P ?  ?                        ?Medical Decision Making ?Amount and/or Complexity of Data Reviewed ?External Data Reviewed: labs and notes. ?ECG/medicine tests: ordered and independent interpretation performed. Decision-making details documented in ED Course. ? ?Risk ?Prescription drug management. ? ? ?Patient is an elderly female with multiple medical problems presenting today with pain from the left elbow down.  This  seems to be more nerve related.  Low suspicion for an atypical cardiac cause.  Patient was actually seen in the emergency room on 10/29/2021 and left before she was seen however she had imaging and lab work done.  S

## 2021-10-31 NOTE — ED Notes (Signed)
Daughter Deanna [name] contacted and enroute to pick pt up. ?

## 2021-11-01 ENCOUNTER — Encounter: Payer: Medicare HMO | Admitting: Family

## 2021-11-01 ENCOUNTER — Telehealth: Payer: Self-pay

## 2021-11-01 ENCOUNTER — Ambulatory Visit: Payer: Medicare HMO | Admitting: General Practice

## 2021-11-01 NOTE — Telephone Encounter (Signed)
Patient daughter called the office crying. She states that mother is in a lot of pain. Recently they went to the hospital 10/31/2021. The hospital checked to make sure her heart was fine, then diagnoses patient with pinched nerve. Patient daughter states that she's at "whits end". 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 Patient daughter states that she doesn't know who to take patient to, or what else to do next. Message send to PCP Lauree Chandler, NP. Please Advise.  ?

## 2021-11-01 NOTE — Telephone Encounter (Signed)
Please offer patient an appt to be seen ?

## 2021-11-01 NOTE — Telephone Encounter (Signed)
Deanna Sax, NP added to message for FYI. Patient daughter states that she is fine with seeing someone other than PCP [name], [name], NP to help mother. Patient scheduled for 2:15pm today 11/01/2021. ?

## 2021-11-02 ENCOUNTER — Other Ambulatory Visit: Payer: Self-pay

## 2021-11-02 ENCOUNTER — Encounter: Payer: Self-pay | Admitting: Family

## 2021-11-02 ENCOUNTER — Ambulatory Visit (INDEPENDENT_AMBULATORY_CARE_PROVIDER_SITE_OTHER): Payer: Medicare HMO | Admitting: Family

## 2021-11-02 VITALS — BP 132/78 | HR 78 | Temp 97.9°F | Resp 18 | Ht 61.0 in

## 2021-11-02 DIAGNOSIS — M792 Neuralgia and neuritis, unspecified: Secondary | ICD-10-CM | POA: Diagnosis not present

## 2021-11-02 DIAGNOSIS — R29898 Other symptoms and signs involving the musculoskeletal system: Secondary | ICD-10-CM | POA: Diagnosis not present

## 2021-11-02 MED ORDER — PREDNISONE 20 MG PO TABS: ORAL_TABLET | ORAL | 0 refills | Status: AC

## 2021-11-02 MED ORDER — KETOROLAC TROMETHAMINE 30 MG/ML IJ SOLN
30.0000 mg | Freq: Once | INTRAMUSCULAR | Status: AC
  Administered 2021-11-02: 30 mg via INTRAMUSCULAR

## 2021-11-02 NOTE — Patient Instructions (Signed)
-   Please go to ED if symptoms worsen or fail to improve  ?

## 2021-11-02 NOTE — Progress Notes (Signed)
? ?Provider: [name] FNP-C ? ?Lauree Chandler, NP ? ?Patient Care Team: ?Lauree Chandler, NP as PCP - General (Geriatric Medicine) ?Lorretta Harp, MD as PCP - Cardiology (Cardiology) ?Irene Shipper, MD as Consulting Physician (Gastroenterology) ?Frederik Pear, MD as Consulting Physician (Orthopedic Surgery) ?Fontaine, Belinda Block, MD (Inactive) as Consulting Physician (Gynecology) ?Rutherford Guys, MD as Consulting Physician (Ophthalmology) ?Bjorn Loser, MD as Consulting Physician (Urology) ?Serafina Mitchell, MD as Consulting Physician (Vascular Surgery) ?Cameron Sprang, MD as Consulting Physician (Neurology) ?Desma Maxim, MD as Referring Physician (Ophthalmology) ?Thayer Headings, PMHNP as Nurse Practitioner (Psychiatry) ?Cameron Sprang, MD as Consulting Physician (Neurology) ?Jon Billings, RN as [place] Management ? ?Extended Emergency Contact Information ?Primary Emergency Contact: Mraz-Young,[name] ?         [place], [place] [address] [place] ?Home Phone: [phone] ?Mobile Phone: [phone] ?Relation: Daughter ? ?Code Status:  DNR ?Goals of care: Advanced Directive information ? ?  11/02/2021  ?  1:53 PM  ?Advanced Directives  ?Does Patient Have a Medical Advance Directive? Yes  ?Type of Advance Directive Healthcare Power of Attorney  ?Does patient want to make changes to medical advance directive? No - Patient declined  ?Copy of [place] in Chart? Yes - validated most recent copy scanned in chart (See row information)  ? ? ? ?Chief Complaint  ?Patient presents with  ? Acute Visit  ?  Patient daughter states patient is miserable from nerve pain.  ? ? ?HPI:  ?Pt is a 81 y.o. female seen today for an acute visit for evaluation of left hand pain.Daughter states has been having pain on the shoulder,upper arm ,elbow and now has spread to hand.describes pain as excruciating.Has been miserable due to pain.Has taken 1600 mg of  Gabapentin,Tylenol and Tizanidine.Has also take Xanax without any relief. Has weakness on hand.No worsening pain with movement. She denies any chest pain,neck pain or injuries.  ?Daughter states was seen in the ED twice with similar nerve pain. On chart review she was in ED 10/28/2021 and 10/31/2021 Gabapentin 100 mg capsule twice daily PRN was prescribed.   ? ? ?Past Medical History:  ?Diagnosis Date  ? Anxiety   ? Bacteremia, escherichia coli 04/27/2015  ? Brachial-basilar insufficiency syndrome   ? CAD (coronary artery disease)   ? Celiac artery stenosis (HCC)   ? Chronic bilateral low back pain without sciatica   ? Chronic combined systolic (congestive) and diastolic (congestive) heart failure ([place]) 11/14/2017  ? CKD (chronic kidney disease), stage IV ([place])   ? Cognitive impairment   ? Colon polyps   ? Community acquired pneumonia   ? Depression   ? Diverticulosis   ? Dysuria   ? Encephalomalacia   ? GAD (generalized anxiety disorder)   ? GERD (gastroesophageal reflux disease)   ? Glaucoma   ? Hearing loss   ? Hemorrhoids   ? Hypertension   ? Hypothyroidism   ? Incontinence   ? Mixed hyperlipidemia   ? Myocardial infarction [facility])   ? OSA on CPAP   ? PAF (paroxysmal atrial fibrillation) ([place])   ? Peripheral arterial disease ([place])   ? Pneumonitis 04/26/2015  ? Pyelonephritis due to Escherichia coli 04/28/2015  ? Sepsis ([place])   ? Spondylosis of lumbar region without myelopathy or radiculopathy   ? TBI (traumatic brain injury)   ? Urticaria   ? Vertigo, benign positional   ? ?Past Surgical History:  ?Procedure Laterality Date  ? ABDOMINAL AORTOGRAM  W/LOWER EXTREMITY N/A 08/22/2017  ? Procedure: ABDOMINAL AORTOGRAM W/LOWER EXTREMITY;  Surgeon: Serafina Mitchell, MD;  Location: [facility];  Service: Cardiovascular;  Laterality: N/A;  ? BILATERAL UPPER EXTREMITY ANGIOGRAM N/A 09/11/2012  ? Procedure: BILATERAL UPPER EXTREMITY ANGIOGRAM;  Surgeon: Serafina Mitchell, MD;  Location[facility] CATH LAB;  Service: Cardiovascular;   Laterality: N/A;  ? BIOPSY  03/25/2020  ? Procedure: BIOPSY;  Surgeon: Irving Copas., MD;  Location: [facility];  Service: Gastroenterology;;  ? carotid duplex doppler Bilateral 09/03/2012, 11/03/2011  ? 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 patent left common carotid- subclavian artery bypass with turbulent flow noted at the anastomosis with velocities of 295 cm/s  ? CAROTID-SUBCLAVIAN BYPASS GRAFT  12/15/2011  ? Procedure: BYPASS GRAFT CAROTID-SUBCLAVIAN;  Surgeon: Serafina Mitchell, MD;  Location: [facility] OR;  Service: Vascular;  Laterality: Left;  Left Carotid subclavian bypass  ? CORONARY ANGIOPLASTY    ? CORONARY ARTERY BYPASS GRAFT    ? CORONARY ATHERECTOMY N/A 03/19/2020  ? Procedure: CORONARY ATHERECTOMY;  Surgeon: Jettie Booze, MD;  Location: [facility];  Service: Cardiovascular;  Laterality: N/A;  ? CORONARY STENT INTERVENTION N/A 03/19/2020  ? Procedure: CORONARY STENT INTERVENTION;  Surgeon: Jettie Booze, MD;  Location: [facility];  Service: Cardiovascular;  Laterality: N/A;  ? DOPPLER ECHOCARDIOGRAPHY  05/27/2010, 09/17/2008  ? Mild Proximal septal thickening is noted. Left ventricular systolic functions is normal ejection fraction =>55%. the aortic valve appears to be mildly sclerotic   ? ESOPHAGOGASTRODUODENOSCOPY (EGD) WITH PROPOFOL N/A 03/25/2020  ? Procedure: ESOPHAGOGASTRODUODENOSCOPY (EGD) WITH PROPOFOL;  Surgeon: Rush Landmark Telford Nab., MD;  Location: [facility];  Service: Gastroenterology;  Laterality: N/A;  ? fem-fem bypass graft  1999  ? holter monitor  01/21/2008  ? The predominant rhythm was normal sinus rhythm. Minimum heartrate of 63 bpm at +01:00, maximum heartrate of 105 bpm at + 10:35; and the average heartrate of 75 bpm. Ventricular ectopic activity totaled 1270: Multifocal; 866-PVC's and 404-VEs              ? JOINT REPLACEMENT    ? Left knee  ? LEFT HEART CATH AND CORS/GRAFTS ANGIOGRAPHY N/A 03/19/2020  ? Procedure: LEFT HEART CATH AND CORS/GRAFTS ANGIOGRAPHY;  Surgeon: Jettie Booze, MD;  Location: [facility];  Service: Cardiovascular;  Laterality: N/A;  ? LEFT HEART CATHETERIZATION WITH CORONARY/GRAFT ANGIOGRAM N/A 12/21/2011  ? Procedure: LEFT HEART CATHETERIZATION WITH Beatrix Fetters;  Surgeon: Leonie Man, MD;  Location: [facility] C[facility] LAB;  Service: Cardiovascular;  Laterality: N/A;  ? NM MYOCAR PERF EJECTION FRACTION  09/22/2009, 07/03/2007  ? the post stress myocardial perfusion images show a normal pattern of perfusion is all regions. The post-stress ejection fraction is 68 %. no significant wall motion abnormalities noted. This is a low risk scan.  ? PERIPHERAL VASCULAR INTERVENTION  08/22/2017  ? Procedure: PERIPHERAL VASCULAR INTERVENTION;  Surgeon: Serafina Mitchell, MD;  Location: [place];  Service: Cardiovascular;;  Fem/Fem Graft  ? REPLACEMENT TOTAL KNEE  05-2011  ? UNILATERAL UPPER EXTREMEITY ANGIOGRAM Left 11/15/2011  ? Procedure: UNILATERAL UPPER Anselmo Rod;  Surgeon: Lorretta Harp, MD;  Location: [facility] CATH LAB;  Service: Cardiovascular;  Laterality: Left;  ? ? ?Allergies  ?Allergen Reactions  ? Donepezil Other (See Comments)  ?  Altered mood, aggression, and caused anger  ? Duloxetine Hcl   ?  Increased confusion and memory concerns   ? Meloxicam   ?  Pt suspects that this medicine causes fluid on her lungs.   ? Memantine Other (See Comments)  ?  Caused severe aggression  ? Oxycodone Other (See Comments)  ?  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 Vicodin [Hydrocodone-Acetaminophen] Other (See Comments)  ?  Delirium, confusion, and toxic dementia  ? ? ?Outpatient Encounter Medications as of 11/02/2021  ?Medication Sig  ? acetaminophen (TYLENOL) 325 MG tablet Take 325 mg by mouth in the morning and at bedtime. 1 by mouth in the  morning and 1 by mouth in the pm.  ? [START ON 12/07/2021] ALPRAZolam (XANAX) 0.5 MG tablet Take 1 tablet (0.5 mg total) by mouth daily as needed for anxiety.  ? aspirin EC 81 MG tablet Take 81 mg by mouth at bedtime.

## 2021-11-04 ENCOUNTER — Encounter: Payer: Self-pay | Admitting: Physician Assistant

## 2021-11-07 NOTE — Progress Notes (Signed)
This encounter was created in error - please disregard.

## 2021-11-09 ENCOUNTER — Telehealth: Payer: Self-pay | Admitting: *Deleted

## 2021-11-09 NOTE — Telephone Encounter (Signed)
Dr. Terance Hart called and left message on Clinical Intake stating that he was needing to speak with [name], NP Directly regarding patient.  ? ?Would like for Dinah to call him directly at [phone] ?

## 2021-11-09 NOTE — Telephone Encounter (Signed)
Please schedule appointment.

## 2021-11-10 ENCOUNTER — Other Ambulatory Visit: Payer: Medicare HMO

## 2021-11-10 ENCOUNTER — Encounter: Payer: Self-pay | Admitting: Family

## 2021-11-10 ENCOUNTER — Inpatient Hospital Stay: Admission: RE | Admit: 2021-11-10 | Payer: Medicare HMO | Source: Ambulatory Visit

## 2021-11-10 ENCOUNTER — Encounter: Payer: Medicare HMO | Admitting: Family

## 2021-11-10 NOTE — Telephone Encounter (Signed)
Scheduled an appointment with daughter for today, 11/10/2021 with Dinah.  ?

## 2021-11-13 ENCOUNTER — Other Ambulatory Visit: Payer: Self-pay | Admitting: Family

## 2021-11-13 DIAGNOSIS — I509 Heart failure, unspecified: Secondary | ICD-10-CM

## 2021-11-15 ENCOUNTER — Ambulatory Visit: Payer: Medicare HMO | Admitting: Physician Assistant

## 2021-11-16 ENCOUNTER — Encounter: Payer: Self-pay | Admitting: Family

## 2021-11-16 ENCOUNTER — Ambulatory Visit (INDEPENDENT_AMBULATORY_CARE_PROVIDER_SITE_OTHER): Payer: Medicare HMO | Admitting: Family

## 2021-11-16 VITALS — BP 120/60 | HR 71 | Temp 97.5°F | Resp 18 | Ht 61.0 in | Wt 192.2 lb

## 2021-11-16 DIAGNOSIS — L989 Disorder of the skin and subcutaneous tissue, unspecified: Secondary | ICD-10-CM | POA: Diagnosis not present

## 2021-11-16 DIAGNOSIS — M792 Neuralgia and neuritis, unspecified: Secondary | ICD-10-CM

## 2021-11-16 DIAGNOSIS — R29898 Other symptoms and signs involving the musculoskeletal system: Secondary | ICD-10-CM | POA: Diagnosis not present

## 2021-11-16 NOTE — Progress Notes (Signed)
? ?Provider: [name] FNP-C ? ?Lauree Chandler, NP ? ?Patient Care Team: ?Lauree Chandler, NP as PCP - General (Geriatric Medicine) ?Lorretta Harp, MD as PCP - Cardiology (Cardiology) ?Irene Shipper, MD as Consulting Physician (Gastroenterology) ?Frederik Pear, MD as Consulting Physician (Orthopedic Surgery) ?Fontaine, Belinda Block, MD (Inactive) as Consulting Physician (Gynecology) ?Rutherford Guys, MD as Consulting Physician (Ophthalmology) ?Bjorn Loser, MD as Consulting Physician (Urology) ?Serafina Mitchell, MD as Consulting Physician (Vascular Surgery) ?Cameron Sprang, MD as Consulting Physician (Neurology) ?Desma Maxim, MD as Referring Physician (Ophthalmology) ?Thayer Headings, PMHNP as Nurse Practitioner (Psychiatry) ?Cameron Sprang, MD as Consulting Physician (Neurology) ?Jon Billings, RN as [facility] ? ?Extended Emergency Contact Information ?Primary Emergency Contact: Dettmann-Young,[name] ?         [place], [place] [address] [place] ?Home Phone: [phone] ?Mobile Phone: [phone] ?Relation: Daughter ? ?Code Status:  DNR ?Goals of care: Advanced Directive information ? ?  11/16/2021  ?  1:Deanna PM  ?Advanced Directives  ?Does Patient Have a Medical Advance Directive? Yes  ?Type of Advance Directive Healthcare Power of Attorney  ?Does patient want to make changes to medical advance directive? No - Patient declined  ? ? ? ?Chief Complaint  ?Patient presents with  ? Follow-up  ?  Follow up on nerve pain.  ? ? ?HPI:  ?Pt is a 81 y.o. Deanna [name] seen today for an acute visit for follow up left hand pain and weakness.  She was here on 11/02/2021 with severe similar complaints of left hand pain and weakness.  She had used gabapentin 1600 mg, Tylenol and tizanidine without any pain relief.  ?She had also been evaluated in the ED for similar pain and gabapentin at 100 mg 2 twice daily as needed was prescribed. ?On 11/02/2021 and prescribed prednisone taper and  was referred to neurologist for evaluation and MRI of the cervical and shoulder were ordered. ?She is here with her daughter. She states pain has improved but still would like to referral referral to neurology could be causing the pain. Unclear why MRI was not done will verify with all prior authorization staff to see whether this was obtained.  Staff is currently out of the office. I have discussed with daughter that we will call her to update on the MRI status and prior authorization. ? ?Also complains of right hand skin lesion would like taken off.  Denies any redness or or drainage from lesion site.No tenderness to touch..Also no fever or chills reported. ? ?Past Medical History:  ?Diagnosis Date  ? Anxiety   ? Bacteremia, escherichia coli 04/27/2015  ? Brachial-basilar insufficiency syndrome   ? CAD (coronary artery disease)   ? Celiac artery stenosis (HCC)   ? Chronic bilateral low back pain without sciatica   ? Chronic combined systolic (congestive) and diastolic (congestive) heart failure ([place]) 11/14/2017  ? CKD (chronic kidney disease), stage IV ([place])   ? Cognitive impairment   ? Colon polyps   ? Community acquired pneumonia   ? Depression   ? Diverticulosis   ? Dysuria   ? Encephalomalacia   ? GAD (generalized anxiety disorder)   ? GERD (gastroesophageal reflux disease)   ? Glaucoma   ? Hearing loss   ? Hemorrhoids   ? Hypertension   ? Hypothyroidism   ? Incontinence   ? Mixed hyperlipidemia   ? Myocardial infarction [facility])   ? OSA on CPAP   ? PAF (paroxysmal atrial fibrillation) ([place])   ?  Peripheral arterial disease ([place])   ? Pneumonitis 04/26/2015  ? Pyelonephritis due to Escherichia coli 04/28/2015  ? Sepsis ([place])   ? Spondylosis of lumbar region without myelopathy or radiculopathy   ? 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[place])   ? Urticaria   ? Vertigo, benign positional   ? ?Past Surgical History:  ?Procedure Laterality Date  ? ABDOMINAL AORTOGRAM W/LOWER EXTREMITY N/A 08/22/2017  ? Procedure: ABDOMINAL  AORTOGRAM W/LOWER EXTREMITY;  Surgeon: Serafina Mitchell, MD;  Location: [place] [facility];  Service: Cardiovascular;  Laterality: N/A;  ? BILATERAL UPPER EXTREMITY ANGIOGRAM N/A 09/11/2012  ? Procedure: BILATERAL UPPER EXTREMITY ANGIOGRAM;  Surgeon: Serafina Mitchell, MD;  Location: [facility] C[facility] LAB;  Service: Cardiovascular;  Laterality: N/A;  ? BIOPSY  03/25/2020  ? Procedure: BIOPSY;  Surgeon: Irving Copas., MD;  Location: [place];  Service: Gastroenterology;;  ? carotid duplex doppler Bilateral 09/03/2012, 11/03/2011  ? 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 patent left common carotid- subclavian artery bypass with turbulent flow noted at the anastomosis with velocities of 295 cm/s  ? CAROTID-SUBCLAVIAN BYPASS GRAFT  12/15/2011  ? Procedure: BYPASS GRAFT CAROTID-SUBCLAVIAN;  Surgeon: Serafina Mitchell, MD;  Location: [facility] OR;  Service: Vascular;  Laterality: Left;  Left Carotid subclavian bypass  ? CORONARY ANGIOPLASTY    ? CORONARY ARTERY BYPASS GRAFT    ? CORONARY ATHERECTOMY N/A 03/19/2020  ? Procedure: CORONARY ATHERECTOMY;  Surgeon: Jettie Booze, MD;  Location: [place] [facility];  Service: Cardiovascular;  Laterality: N/A;  ? CORONARY STENT INTERVENTION N/A 03/19/2020  ? Procedure: CORONARY STENT INTERVENTION;  Surgeon: Jettie Booze, MD;  Location: [place] [facility];  Service: Cardiovascular;  Laterality: N/A;  ? DOPPLER ECHOCARDIOGRAPHY  05/27/2010, 09/17/2008  ? Mild Proximal septal thickening is noted. Left ventricular systolic functions is normal ejection fraction =>55%. the aortic valve appears to be mildly sclerotic   ? ESOPHAGOGASTRODUODENOSCOPY (EGD) WITH PROPOFOL N/A 03/25/2020  ? Procedure: ESOPHAGOGASTRODUODENOSCOPY (EGD) WITH PROPOFOL;  Surgeon: Rush Landmark Telford Nab., MD;  Location: [place];  Service: Gastroenterology;   Laterality: N/A;  ? fem-fem bypass graft  1999  ? holter monitor  01/21/2008  ? The predominant rhythm was normal sinus rhythm. Minimum heartrate of 63 bpm at +01:00, maximum heartrate of 105 bpm at + 10:35; and the average heartrate of 75 bpm. Ventricular ectopic activity totaled 1270: Multifocal; 866-PVC's and 404-VEs             ? JOINT REPLACEMENT    ? Left knee  ? LEFT HEART CATH AND CORS/GRAFTS ANGIOGRAPHY N/A 03/19/2020  ? Procedure: LEFT HEART CATH AND CORS/GRAFTS ANGIOGRAPHY;  Surgeon: Jettie Booze, MD;  Location: [place] [facility];  Service: Cardiovascular;  Laterality: N/A;  ? LEFT HEART CATHETERIZATION WITH CORONARY/GRAFT ANGIOGRAM N/A 12/21/2011  ? Procedure: LEFT HEART CATHETERIZATION WITH Beatrix Fetters;  Surgeon: Leonie Man, MD;  Location: [facility] CATH LAB;  Service: Cardiovascular;  Laterality: N/A;  ? NM MYOCAR PERF EJECTION FRACTION  09/22/2009, 07/03/2007  ? the post stress myocardial perfusion images show a normal pattern of perfusion is all regions. The post-stress ejection fraction is 68 %. no significant wall motion abnormalities noted. This is a low risk scan.  ? PERIPHERAL VASCULAR INTERVENTION  08/22/2017  ? Procedure: PERIPHERAL VASCULAR INTERVENTION;  Surgeon: Serafina Mitchell, MD;  Location: [place] [facility];  Service: Cardiovascular;;  Fem/Fem Graft  ?  REPLACEMENT TOTAL KNEE  05-2011  ? UNILATERAL UPPER EXTREMEITY ANGIOGRAM Left 11/15/2011  ? Procedure: UNILATERAL UPPER Anselmo Rod;  Surgeon: Lorretta Harp, MD;  Location: [facility] CATH LAB;  Service: Cardiovascular;  Laterality: Left;  ? ? ?Allergies  ?Allergen Reactions  ? Donepezil Other (See Comments)  ?  Altered mood, aggression, and caused anger  ? Duloxetine Hcl   ?  Increased confusion and memory concerns   ? Meloxicam   ?  Pt suspects that this medicine causes fluid on her lungs.   ? Memantine Other (See Comments)  ?  Caused severe aggression  ? Oxycodone Other (See Comments)  ?  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 Vicodin [Hydrocodone-Acetaminophen] Other (See Comments)  ?  Delirium, confusion, and toxic dementia  ? ? ?Outpatient Encounter Medications as of 11/16/2021  ?Medication Sig

## 2021-11-17 ENCOUNTER — Other Ambulatory Visit: Payer: Self-pay

## 2021-11-17 NOTE — Patient Outreach (Signed)
[facility]) Care Management ? ?11/17/2021 ? ?Deanna [name] ?August 31, 1940 ?[id number] ? ? ?Telephone call to daughter [name] for follow up. She states patient is doing ok but did have a bout with pinched nerve but is better now after prednisone dosing for a few days.  Heart failure management continues.  Home care still coming in to help with relief for daughter.  She states she will probably see about trying to staff for a weekend som she can get some time away.  Encouraged daughter to provide self care as a caregiver.   ? ?Care Plan : General Plan of Care (Adult)  ?Updates made by Jon Billings, RN since 11/17/2021 12:00 AM  ?Completed 11/17/2021  ? ?Problem: [facility] (General Plan of Care) Resolved 11/17/2021  ?  ? ?Care Plan : Heart Failure (Adult)  ?Updates made by Jon Billings, RN since 11/17/2021 12:00 AM  ?  ? ?Problem: Symptom Exacerbation (Heart Failure)   ?  ? ?Long-Range Goal: Symptom Exacerbation Prevented or Minimized as evidenced by no acute exacerbations of heart failure Completed 11/17/2021  ?Start Date: 05/12/2021  ?Expected End Date: 02/04/2022  ?Recent Progress: On track  ?Priority: High  ?Note:   ?Evidence-based guidance:  ?Perform or review cognitive and/or health literacy screening.  ?Assess understanding of adherence and barriers to treatment plan, as well as lifestyle changes; develop strategies to address barriers.  ?Establish a mutually-agreed-upon early intervention process to communicate with primary care provider when signs/symptoms worsen.  ?Facilitate timely posthospital discharge or emergency department treatment that includes intensive follow-up via telephone calls, home visit, telehealth monitoring and care at multidisciplinary heart failure clinic.  ?Adjust frequency and intensity of follow-up based on presentation, number of emergency department visits, hospital admissions and frequency and severity of symptom exacerbation.  ?Facilitate timely visit, usually  within 1 week, with primary care provider following hospital discharge.  ?Collaborate with clinical pharmacist to address adverse drug reactions, drug interactions, subtherapeutic dosage, patient and family education.  ?Regularly screen for presence of depressive symptoms using a validated tool; consider pharmacologic therapy and/or referral for cognitive behavioral therapy when present.  ?Refer to community-based services, such as a heart failure support group, community Economist or peer support program.  ?Review immunization status; arrange receipt of needed vaccinations.  ?Prepare patient for home oxygen use based on signs/symptoms.   ?Notes:  ?8/92/11 Resolving duplicate goal ?  ? ?Task: Identify and Minimize Risk of Heart Failure Exacerbation Completed 11/17/2021  ?Due Date: 02/04/2022  ?Priority: Routine  ?Responsible User: Jon Billings, RN  ?Note:   ?Care Management Activities:  ?  ?- healthy lifestyle promoted ?- rescue (action) plan reviewed  ?  ?Notes: 05/12/21 Daughter is primary caregiver.  She is able to describe daily regimen for heart failure and when to call physician. She makes sure patient weighs daily. Weight 193.6 lbs and daughter is recording it.   ?Heart Failure Management Discussed ?Please weight daily or as ordered by your doctor ?Report to your doctor weight gain of 2 -3 pounds in a day or 5 pounds in a week ?Limit salt intake ?Monitor for shortness of breath, swelling of feet, ankles or abdomen and weight gain. ?Never use the saltshaker.  ?Read all food labels and avoid canned, processed, and pickled foods.   ?Follow your doctor's recommendations for daily salt intake. ?05/24/21 Spoke with daughter [name]. She reports weights continue daily and last weight was 193 lbs. Reiterated heart failure management.  She voices no concerns.   ?06/14/21 Spoke with  daughter [name]. Patient weight now 186 lbs.  Reviewed heart failure management.    ?  ? ?Care Plan : RN CM Plan of Care  ?Updates made by  Jon Billings, RN since 11/17/2021 12:00 AM  ?  ? ?Problem: Chronic Disease Management and Care Coordination needs of heart failure   ?Priority: High  ?  ? ?Long-Range Goal: Development of Plan of Care for management of Heart Failure   ?Start Date: 11/17/2021  ?Expected End Date: 08/07/2022  ?This Visit's Progress: On track  ?Priority: High  ?Note:   ?Current Barriers:  ?Chronic Disease Management support and education needs related to CHF  ? ?RNCM Clinical Goal(s):  ?Patient will verbalize basic understanding of  CHF disease process and self health management plan as evidenced by No acute Exacerbation of Heart Failure ?continue to work with RN Care Manager to address care management and care coordination needs related to  CHF as evidenced by adherence to CM Team Scheduled appointments through collaboration with RN Care manager, provider, and care team.  ? ?Interventions: ?Education and support related to heart failure ?Inter-disciplinary care team collaboration (see longitudinal plan of care) ?Evaluation of current treatment plan related to  self management and patient's adherence to plan as established by provider ? ? ?Heart Failure Interventions:  (Status:  Goal on track:  Yes.) Long Term Goal ?Basic overview and discussion of pathophysiology of Heart Failure reviewed ?Provided education on low sodium diet ? ?Patient Goals/Self-Care Activities: Heart failure ?Take all medications as prescribed ?Attend all scheduled provider appointments ?call office if I gain more than 2 pounds in one day or 5 pounds in one week ?use salt in moderation ?watch for swelling in feet, ankles and legs every day ?weigh myself daily ?follow rescue plan if symptoms flare-up ? ? ?11/17/21 Patient weight 192 lbs.  Heart failure management continues.  Daughter has some health challenges to address.  Encouraged to take care of her health issues in order to care for patient.   ? ?Follow Up Plan:  Telephone follow up appointment with care  management team member scheduled for:  May ?The patient has been provided with contact information for the care management team and has been advised to call with any health related questions or concerns.  ? ?  ? ?Plan: Follow-up: Patient agrees to Care Plan and Follow-up. ?Follow-up in May.   ? ?[name], RN, MSN ?[facility] Care Management ?Care Management Coordinator ?Direct Line [phone] ?Toll Free: [phone]  ?Fax: [phone] ? ?

## 2021-11-17 NOTE — Patient Instructions (Signed)
Patient Goals/Self-Care Activities: Heart failure ?Take all medications as prescribed ?Attend all scheduled provider appointments ?call office if I gain more than 2 pounds in one day or 5 pounds in one week ?use salt in moderation ?watch for swelling in feet, ankles and legs every day ?weigh myself daily ?follow rescue plan if symptoms flare-up ? ?

## 2021-11-21 ENCOUNTER — Ambulatory Visit
Admission: RE | Admit: 2021-11-21 | Discharge: 2021-11-21 | Disposition: A | Discharge status: Home / Self Care | Payer: Medicare HMO | Source: Ambulatory Visit | Attending: Family | Admitting: Family

## 2021-11-21 DIAGNOSIS — M792 Neuralgia and neuritis, unspecified: Secondary | ICD-10-CM

## 2021-11-21 DIAGNOSIS — S46012A Strain of muscle(s) and tendon(s) of the rotator cuff of left shoulder, initial encounter: Secondary | ICD-10-CM | POA: Diagnosis not present

## 2021-11-21 DIAGNOSIS — R29898 Other symptoms and signs involving the musculoskeletal system: Secondary | ICD-10-CM

## 2021-11-21 DIAGNOSIS — M47812 Spondylosis without myelopathy or radiculopathy, cervical region: Secondary | ICD-10-CM | POA: Diagnosis not present

## 2021-11-21 NOTE — Progress Notes (Signed)
This encounter was created in error - please disregard.

## 2021-11-25 ENCOUNTER — Ambulatory Visit: Payer: Medicare HMO | Admitting: Physician Assistant

## 2021-11-25 ENCOUNTER — Encounter: Payer: Self-pay | Admitting: Physician Assistant

## 2021-11-25 VITALS — BP 122/74 | HR 98 | Resp 18 | Ht 61.0 in | Wt 197.0 lb

## 2021-11-25 DIAGNOSIS — M25512 Pain in left shoulder: Secondary | ICD-10-CM | POA: Diagnosis not present

## 2021-11-25 DIAGNOSIS — G3184 Mild cognitive impairment, so stated: Secondary | ICD-10-CM | POA: Diagnosis not present
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
## 2021-11-25 NOTE — Patient Instructions (Addendum)
It was a pleasure to see you today at our office.  ? ?Recommendations: ? ?Meds: ? Referral to Orthopedics for L shoulder arthritic pain  ?When you neck hurts from the narrowings you will need referral to Neurosurgery  ?No antidementia medication will be given because of the bad side effects, thus, no follow up at our memory clinic is indicated   ? ?RECOMMENDATIONS FOR ALL PATIENTS WITH MEMORY PROBLEMS: ?1. Continue to exercise (Recommend 30 minutes of walking everyday, or 3 hours every week) ?2. Increase social interactions - continue going to [place] and enjoy social gatherings with friends and family ?3. Eat healthy, avoid fried foods and eat more fruits and vegetables ?4. Maintain adequate blood pressure, blood sugar, and blood cholesterol level. Reducing the risk of stroke and cardiovascular disease also helps promoting better memory. ?5. Avoid stressful situations. Live a simple life and avoid aggravations. Organize your time and prepare for the next day in anticipation. ?6. Sleep well, avoid any interruptions of sleep and avoid any distractions in the bedroom that may interfere with adequate sleep quality ?7. Avoid sugar, avoid sweets as there is a strong link between excessive sugar intake, diabetes, and cognitive impairment ?We discussed the Mediterranean diet, which has been shown to help patients reduce the risk of progressive memory disorders and reduces cardiovascular risk. This includes eating fish, eat fruits and green leafy vegetables, nuts like almonds and hazelnuts, walnuts, and also use olive oil. Avoid fast foods and fried foods as much as possible. Avoid sweets and sugar as sugar use has been linked to worsening of memory function. ? ?There is always a concern of gradual progression of memory problems. If this is the case, then we may need to adjust level of care according to patient needs. Support, both to the patient and caregiver, should then be put into place.  ? ? ?The Alzheimer?s Association  is here all day, every day for people facing Alzheimer?s disease through our free 24/7 Helpline: [phone]. The Helpline provides reliable information and support to all those who need assistance, such as individuals living with memory loss, Alzheimer's or other dementia, caregivers, health care professionals and the public.  ?Our highly trained and knowledgeable staff can help you with: ?Understanding memory loss, dementia and Alzheimer's  ?Medications and other treatment options  ?General information about aging and brain health  ?Skills to provide quality care and to find the best care from professionals  ?Legal, financial and living-arrangement decisions ?Our Helpline also features: ?Confidential care consultation provided by master's level clinicians who can help with decision-making support, crisis assistance and education on issues families face every day  ?Help in a caller's preferred language using our translation service that features more than 200 languages and dialects  ?Referrals to local community programs, services and ongoing support ? ? ? ? ?FALL PRECAUTIONS: Be cautious when walking. Scan the area for obstacles that may increase the risk of trips and falls. When getting up in the mornings, sit up at the edge of the bed for a few minutes before getting out of bed. Consider elevating the bed at the head end to avoid drop of blood pressure when getting up. Walk always in a well-lit room (use night lights in the walls). Avoid area rugs or power cords from appliances in the middle of the walkways. Use a walker or a cane if necessary and consider physical therapy for balance exercise. Get your eyesight checked regularly. ? ?FINANCIAL OVERSIGHT: Supervision, especially oversight when making financial decisions or transactions  is also recommended. ? ?HOME SAFETY: Consider the safety of the kitchen when operating appliances like stoves, microwave oven, and blender. Consider having supervision and share  cooking responsibilities until no longer able to participate in those. Accidents with firearms and other hazards in the house should be identified and addressed as well. ? ? ?ABILITY TO BE LEFT ALONE: If patient is unable to contact 911 operator, consider using LifeLine, or when the need is there, arrange for someone to stay with patients. Smoking is a fire hazard, consider supervision or cessation. Risk of wandering should be assessed by caregiver and if detected at any point, supervision and safe proof recommendations should be instituted. ? ?MEDICATION SUPERVISION: Inability to self-administer medication needs to be constantly addressed. Implement a mechanism to ensure safe administration of the medications. ? ? ?DRIVING: Regarding driving, in patients with progressive memory problems, driving will be impaired. We advise to have someone else do the driving if trouble finding directions or if minor accidents are reported. Independent driving assessment is available to determine safety of driving. ? ? ?If you are interested in the driving assessment, you can contact the following: ? ?[employer] in [place] ? ?[place] [phone] ? ?[facility] [phone] ? ?[facility] [phone] or [phone] ? ?  ? ? ?Mediterranean Diet ?A Mediterranean diet refers to food and lifestyle choices that are based on the traditions of countries located on the [employer]. This way of eating has been shown to help prevent certain conditions and improve outcomes for people who have chronic diseases, like kidney disease and heart disease. ?What are tips for following this plan? ?Lifestyle  ?Cook and eat meals together with your family, when possible. ?Drink enough fluid to keep your urine clear or pale yellow. ?Be physically active every day. This includes: ?Aerobic exercise like running or swimming. ?Leisure activities like gardening, walking, or  housework. ?Get 7-8 hours of sleep each night. ?If recommended by your health care provider, drink red wine in moderation. This means 1 glass a day for nonpregnant women and 2 glasses a day for men. A glass of wine equals 5 oz (150 mL). ?Reading food labels  ?Check the serving size of packaged foods. For foods such as rice and pasta, the serving size refers to the amount of cooked product, not dry. ?Check the total fat in packaged foods. Avoid foods that have saturated fat or trans fats. ?Check the ingredients list for added sugars, such as corn syrup. ?Shopping  ?At the grocery store, buy most of your food from the areas near the walls of the store. This includes: ?Fresh fruits and vegetables (produce). ?Grains, beans, nuts, and seeds. Some of these may be available in unpackaged forms or large amounts (in bulk). ?Fresh seafood. ?Poultry and eggs. ?Low-fat dairy products. ?Buy whole ingredients instead of prepackaged foods. ?Buy fresh fruits and vegetables in-season from local farmers markets. ?Buy frozen fruits and vegetables in resealable bags. ?If you do not have access to quality fresh seafood, buy precooked frozen shrimp or canned fish, such as tuna, salmon, or sardines. ?Buy small amounts of raw or cooked vegetables, salads, or olives from the deli or salad bar at your store. ?Stock your pantry so you always have certain foods on hand, such as olive oil, canned tuna, canned tomatoes, rice, pasta, and beans. ?Cooking  ?Cook foods with extra-virgin olive oil instead of using butter or other vegetable oils. ?Have meat as a side dish, and have vegetables or grains  as your main dish. This means having meat in small portions or adding small amounts of meat to foods like pasta or stew. ?Use beans or vegetables instead of meat in common dishes like chili or lasagna. ?Experiment with different cooking methods. Try roasting or broiling vegetables instead of steaming or saut?eing them. ?Add frozen vegetables to soups,  stews, pasta, or rice. ?Add nuts or seeds for added healthy fat at each meal. You can add these to yogurt, salads, or vegetable dishes. ?Marinate fish or vegetables using olive oil, lemon juice, garlic, and fresh

## 2021-12-05 ENCOUNTER — Other Ambulatory Visit: Payer: Self-pay | Admitting: Nurse Practitioner

## 2021-12-05 DIAGNOSIS — E785 Hyperlipidemia, unspecified: Secondary | ICD-10-CM

## 2021-12-07 NOTE — Progress Notes (Signed)
? ?Cardiology Clinic Note  ? ?Patient Name: Deanna [name] ?Date of Encounter: 12/09/2021 ? ?Primary Care Provider:  Lauree Chandler, NP ?Primary Cardiologist:  Quay Burow, MD ? ?Patient Profile  ?  ?Deanna [name] 81 year old female presents the clinic today for a follow-up evaluation of her essential hypertension, coronary artery disease status post CABG, critical lower limb ischemia, PAD, GERD, hypothyroidism, and hyperlipidemia. ? ?Past Medical History  ?  ?Past Medical History:  ?Diagnosis Date  ? Anxiety   ? Bacteremia, escherichia coli 04/27/2015  ? Brachial-basilar insufficiency syndrome   ? CAD (coronary artery disease)   ? Celiac artery stenosis (HCC)   ? Chronic bilateral low back pain without sciatica   ? Chronic combined systolic (congestive) and diastolic (congestive) heart failure ([place]) 11/14/2017  ? CKD (chronic kidney disease), stage IV ([place])   ? Cognitive impairment   ? Colon polyps   ? Community acquired pneumonia   ? Depression   ? Diverticulosis   ? Dysuria   ? Encephalomalacia   ? GAD (generalized anxiety disorder)   ? GERD (gastroesophageal reflux disease)   ? Glaucoma   ? Hearing loss   ? Hemorrhoids   ? Hypertension   ? Hypothyroidism   ? Incontinence   ? Mixed hyperlipidemia   ? Myocardial infarction [facility])   ? OSA on CPAP   ? PAF (paroxysmal atrial fibrillation) ([place])   ? Peripheral arterial disease ([place])   ? Pneumonitis 04/26/2015  ? Pyelonephritis due to Escherichia coli 04/28/2015  ? Sepsis ([place])   ? Spondylosis of lumbar region without myelopathy or radiculopathy   ? 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[place])   ? Urticaria   ? Vertigo, benign positional   ? ?Past Surgical History:  ?Procedure Laterality Date  ? ABDOMINAL AORTOGRAM W/LOWER EXTREMITY N/A 08/22/2017  ? Procedure: ABDOMINAL AORTOGRAM W/LOWER EXTREMITY;  Surgeon: Serafina Mitchell, MD;  Location: [place] [facility];  Service: Cardiovascular;  Laterality: N/A;  ? BILATERAL UPPER EXTREMITY ANGIOGRAM N/A 09/11/2012  ? Procedure:  BILATERAL UPPER EXTREMITY ANGIOGRAM;  Surgeon: Serafina Mitchell, MD;  Location: [facility] CATH LAB;  Service: Cardiovascular;  Laterality: N/A;  ? BIOPSY  03/25/2020  ? Procedure: BIOPSY;  Surgeon: Irving Copas., MD;  Location: [place];  Service: Gastroenterology;;  ? carotid duplex doppler Bilateral 09/03/2012, 11/03/2011  ? 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 patent left common carotid- subclavian artery bypass with turbulent flow noted at the anastomosis with velocities of 295 cm/s  ? CAROTID-SUBCLAVIAN BYPASS GRAFT  12/15/2011  ? Procedure: BYPASS GRAFT CAROTID-SUBCLAVIAN;  Surgeon: Serafina Mitchell, MD;  Location: [facility] OR;  Service: Vascular;  Laterality: Left;  Left Carotid subclavian bypass  ? CORONARY ANGIOPLASTY    ? CORONARY ARTERY BYPASS GRAFT    ? CORONARY ATHERECTOMY N/A 03/19/2020  ? Procedure: CORONARY ATHERECTOMY;  Surgeon: Jettie Booze, MD;  Location: [place] [facility];  Service: Cardiovascular;  Laterality: N/A;  ? CORONARY STENT INTERVENTION N/A 03/19/2020  ? Procedure: CORONARY STENT INTERVENTION;  Surgeon: Jettie Booze, MD;  Location: [place] [facility];  Service: Cardiovascular;  Laterality: N/A;  ? DOPPLER ECHOCARDIOGRAPHY  05/27/2010, 09/17/2008  ? Mild Proximal septal thickening is noted. Left ventricular systolic functions is normal ejection fraction =>55%. the aortic valve appears to be mildly sclerotic   ? ESOPHAGOGASTRODUODENOSCOPY (EGD) WITH PROPOFOL N/A 03/25/2020  ? Procedure: ESOPHAGOGASTRODUODENOSCOPY (EGD) WITH PROPOFOL;  Surgeon: Irving Copas., MD;  Location: [place];  Service: Gastroenterology;  Laterality: N/A;  ? fem-fem bypass graft  1999  ? holter monitor  01/21/2008  ? The predominant rhythm was normal sinus rhythm. Minimum heartrate of 63 bpm at +01:00, maximum heartrate of 105 bpm at +  10:35; and the average heartrate of 75 bpm. Ventricular ectopic activity totaled 1270: Multifocal; 866-PVC's and 404-VEs             ? JOINT REPLACEMENT    ? Left knee  ? LEFT HEART CATH AND CORS/GRAFTS ANGIOGRAPHY N/A 03/19/2020  ? Procedure: LEFT HEART CATH AND CORS/GRAFTS ANGIOGRAPHY;  Surgeon: Jettie Booze, MD;  Location: [place] [facility];  Service: Cardiovascular;  Laterality: N/A;  ? LEFT HEART CATHETERIZATION WITH CORONARY/GRAFT ANGIOGRAM N/A 12/21/2011  ? Procedure: LEFT HEART CATHETERIZATION WITH Beatrix Fetters;  Surgeon: Leonie Man, MD;  Location: [facility] CATH LAB;  Service: Cardiovascular;  Laterality: N/A;  ? NM MYOCAR PERF EJECTION FRACTION  09/22/2009, 07/03/2007  ? the post stress myocardial perfusion images show a normal pattern of perfusion is all regions. The post-stress ejection fraction is 68 %. no significant wall motion abnormalities noted. This is a low risk scan.  ? PERIPHERAL VASCULAR INTERVENTION  08/22/2017  ? Procedure: PERIPHERAL VASCULAR INTERVENTION;  Surgeon: Serafina Mitchell, MD;  Location: [place] [facility];  Service: Cardiovascular;;  Fem/Fem Graft  ? REPLACEMENT TOTAL KNEE  05-2011  ? UNILATERAL UPPER EXTREMEITY ANGIOGRAM Left 11/15/2011  ? Procedure: UNILATERAL UPPER Anselmo Rod;  Surgeon: Lorretta Harp, MD;  Location[facility] CATH LAB;  Service: Cardiovascular;  Laterality: Left;  ? ? ?Allergies ? ?Allergies  ?Allergen Reactions  ? Donepezil Other (See Comments)  ?  Altered mood, aggression, and caused anger  ? Duloxetine Hcl   ?  Increased confusion and memory concerns   ? Meloxicam   ?  Pt suspects that this medicine causes fluid on her lungs.   ? Memantine Other (See Comments)  ?  Caused severe aggression  ? Oxycodone Other (See Comments)  ?  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 Vicodin [Hydrocodone-Acetaminophen] Other (See Comments)  ?  Delirium, confusion, and toxic dementia  ? ? ?History of Present Illness  ?  ?Ms. Lyons has a  PMH of essential hypertension, coronary artery disease status post CABG, critical lower limb ischemia, PAD, GERD, hypothyroidism, obesity, anxiety, depression, thrombocytopenia, chronic pain, and hyperlipidemia.  She had an MI with PTCA to her left anterior descending in 1994, PTCA to LCx in 1995 and underwent CABG in 1999.  Due to her PAD she had left to right femorofemoral crossover and has also had left subclavian artery stenosis and left common carotid to subclavian artery bypass. ?  ?She presented to [facility] on 03/18/2020 and was discharged on 04/01/2020.  She presented to the hospital with chest pain and was found to have elevated troponins.  Her last cardiac catheterization 2013 via her left common femoral artery below femorofemoral bypass anastomosis showed LIMA-LAD with occluded LT ICA, SVG to diagonal patent, SVG to OM patent, SVG to RCA 100% thrombotic.  There was a large radiopaque filling defect just prior to her occlusion site.  This was not safe PCI for fear of distal embolization.  A widely patent left common and external iliac with brisk flow L-R femorofemoral bypass graft was noted. ?  ?She also indicated that she was having shortness of breath and back pain.  Her chest  pain was described as burning pain  Her back  pain had increased.  EKG showed sinus tach at 103, flattened T waves, inferior lateral and on later EKGs increase in ST depression.  She underwent cardiac catheterization on 03/19/2020 which showed ostial LAD-proximal LAD lesion 99% LIMA-LAD known to be atrertic.  She underwent orbital arthrectomy and received DES x1.  She also has mid LAD 100% stenosis however, arthrectomy and angioplasty could not restore flow.  Mid circumflex 90% stenosed SVG-OM patent.  Proximal RCA lesion 100% stenosed SVG-PDA occluded LVEF 25-35%.  Her echocardiogram 03/19/2020 showed an LVEF of 35-40% G2 DD, mildly reduced right ventricular function and mild mitral valve regurgitation. ?  ?She presented to  the clinic 04/22/2020 for follow-up evaluation with her daughter and stated she was feeling much better.  She continued to be on oxygen 1.5 L nasal cannula.  Her endurance had improved as well as her st

## 2021-12-09 ENCOUNTER — Telehealth: Payer: Self-pay

## 2021-12-09 ENCOUNTER — Encounter: Payer: Self-pay | Admitting: General Practice

## 2021-12-09 ENCOUNTER — Ambulatory Visit: Payer: Medicare HMO | Admitting: General Practice

## 2021-12-09 VITALS — BP 136/84 | HR 96 | Ht 61.0 in | Wt 193.6 lb

## 2021-12-09 DIAGNOSIS — E782 Mixed hyperlipidemia: Secondary | ICD-10-CM | POA: Diagnosis not present

## 2021-12-09 DIAGNOSIS — I480 Paroxysmal atrial fibrillation: Secondary | ICD-10-CM | POA: Diagnosis not present

## 2021-12-09 DIAGNOSIS — I5043 Acute on chronic combined systolic (congestive) and diastolic (congestive) heart failure: Secondary | ICD-10-CM | POA: Diagnosis not present

## 2021-12-09 DIAGNOSIS — I2510 Atherosclerotic heart disease of native coronary artery without angina pectoris: Secondary | ICD-10-CM | POA: Diagnosis not present

## 2021-12-09 NOTE — Telephone Encounter (Signed)
Patient and daughter left before I could give them discharge instructions. Reviewed salty 6 and to increase physical activity as tolerated. Daughter stated I do not have to mail her the AVS, she has many salty 6 forms and patient does try to eliminate salt in the diet. ?

## 2021-12-09 NOTE — Patient Instructions (Signed)
Medication Instructions:  ?Your Physician recommend you continue on your current medication as directed.   ? ?*If you need a refill on your cardiac medications before your next appointment, please call your pharmacy* ? ?Follow-Up: ?At [facility], you and your health needs are our priority.  As part of our continuing mission to provide you with exceptional heart care, we have created designated Provider Care Teams.  These Care Teams include your primary Cardiologist (physician) and Advanced Practice Providers (APPs -  Physician Assistants and Nurse Practitioners) who all work together to provide you with the care you need, when you need it. ? ?We recommend signing up for the patient portal called "MyChart".  Sign up information is provided on this After Visit Summary.  MyChart is used to connect with patients for Virtual Visits (Telemedicine).  Patients are able to view lab/test results, encounter notes, upcoming appointments, etc.  Non-urgent messages can be sent to your provider as well.   ?To learn more about what you can do with MyChart, go to [email or web address].   ? ?Your next appointment:   ?6 -9 month(s) ? ?The format for your next appointment:   ?In Person ? ?Provider:   ?Quay Burow, MD  ? ? ?Other Instructions ?Follow the Salty 6 diet provided. ?Increase physical activity as tolerated. ? ?

## 2021-12-16 ENCOUNTER — Other Ambulatory Visit: Payer: Self-pay

## 2021-12-16 NOTE — Patient Outreach (Signed)
[facility] [facility]) Care Management ? ?12/16/2021 ? ?[name] ?10-04-1940 ?[id number] ? ? ?Telephone call to daughter [name]. No answer.  Unable to leave a voice message. ? ?Plan: RN CM will attempt again in the month of July.   ? ?[name], RN, MSN ?[facility] Care Management ?Care Management Coordinator ?Direct Line [phone] ?Toll Free: [phone]  ?Fax: [phone] ? ?

## 2021-12-19 ENCOUNTER — Other Ambulatory Visit: Payer: Self-pay | Admitting: Nurse Practitioner

## 2022-01-12 DIAGNOSIS — I129 Hypertensive chronic kidney disease with stage 1 through stage 4 chronic kidney disease, or unspecified chronic kidney disease: Secondary | ICD-10-CM | POA: Diagnosis not present

## 2022-01-12 DIAGNOSIS — N1832 Chronic kidney disease, stage 3b: Secondary | ICD-10-CM | POA: Diagnosis not present

## 2022-01-12 DIAGNOSIS — N189 Chronic kidney disease, unspecified: Secondary | ICD-10-CM | POA: Diagnosis not present

## 2022-01-12 DIAGNOSIS — N184 Chronic kidney disease, stage 4 (severe): Secondary | ICD-10-CM | POA: Diagnosis not present

## 2022-01-12 DIAGNOSIS — I509 Heart failure, unspecified: Secondary | ICD-10-CM | POA: Diagnosis not present

## 2022-01-12 DIAGNOSIS — N2581 Secondary hyperparathyroidism of renal origin: Secondary | ICD-10-CM | POA: Diagnosis not present

## 2022-01-12 DIAGNOSIS — G3184 Mild cognitive impairment, so stated: Secondary | ICD-10-CM | POA: Diagnosis not present

## 2022-01-12 DIAGNOSIS — I739 Peripheral vascular disease, unspecified: Secondary | ICD-10-CM | POA: Diagnosis not present

## 2022-01-12 DIAGNOSIS — E039 Hypothyroidism, unspecified: Secondary | ICD-10-CM | POA: Diagnosis not present

## 2022-01-12 DIAGNOSIS — D631 Anemia in chronic kidney disease: Secondary | ICD-10-CM | POA: Diagnosis not present

## 2022-01-12 LAB — IRON,TIBC AND FERRITIN PANEL
%SAT: 12
Iron: 46
TIBC: 400
UIBC: 354

## 2022-01-12 LAB — BASIC METABOLIC PANEL
BUN: 21 (ref 4–21)
CO2: 33 — AB (ref 13–22)
Chloride: 99 (ref 99–108)
Creatinine: 1.7 — AB (ref 0.5–1.1)
Glucose: 104
Potassium: 4.1 mEq/L (ref 3.5–5.1)
Sodium: 141 (ref 137–147)

## 2022-01-12 LAB — PROTEIN / CREATININE RATIO, URINE
Albumin, U: 60
Creatinine, Urine: 55.2

## 2022-01-12 LAB — CBC AND DIFFERENTIAL
HCT: 42 (ref 36–46)
Hemoglobin: 13.8 (ref 12.0–16.0)
Platelets: 195 10*3/uL (ref 150–400)
WBC: 10

## 2022-01-12 LAB — COMPREHENSIVE METABOLIC PANEL
Albumin: 4.5 (ref 3.5–5.0)
Calcium: 10 (ref 8.7–10.7)
eGFR: 30

## 2022-01-12 LAB — VITAMIN D 25 HYDROXY (VIT D DEFICIENCY, FRACTURES): Vit D, 25-Hydroxy: 49.4

## 2022-01-12 LAB — MICROALBUMIN / CREATININE URINE RATIO: Microalb Creat Ratio: 109

## 2022-01-12 LAB — CBC: RBC: 5.11 (ref 3.87–5.11)

## 2022-01-18 ENCOUNTER — Encounter: Payer: Self-pay | Admitting: Nurse Practitioner

## 2022-01-18 NOTE — Progress Notes (Signed)
Dr. Madelon Lips.

## 2022-01-24 ENCOUNTER — Other Ambulatory Visit: Payer: Self-pay

## 2022-01-24 DIAGNOSIS — F411 Generalized anxiety disorder: Secondary | ICD-10-CM

## 2022-01-24 MED ORDER — BUSPIRONE HCL 15 MG PO TABS: 15.0000 mg | ORAL_TABLET | Freq: Two times a day (BID) | ORAL | 0 refills | Status: DC

## 2022-02-09 ENCOUNTER — Other Ambulatory Visit: Payer: Self-pay

## 2022-02-09 NOTE — Patient Outreach (Signed)
[place] [place] [facility]) Care Management  02/09/2022  Deanna [name] 03-Dec-1940 [id number]   Telephone call to daughter [name] for follow up disease management.  No answer.  Unable to leave a message.  Plan: RN CM will send letter and attempt again in the month of July.    [name], RN, MSN [facility] Care Management Care Management Coordinator Direct Line [phone] Toll Free: [phone]  Fax: [phone]

## 2022-02-14 DIAGNOSIS — G4733 Obstructive sleep apnea (adult) (pediatric): Secondary | ICD-10-CM | POA: Diagnosis not present

## 2022-02-15 ENCOUNTER — Telehealth: Payer: Self-pay | Admitting: *Deleted

## 2022-02-15 DIAGNOSIS — E785 Hyperlipidemia, unspecified: Secondary | ICD-10-CM

## 2022-02-15 DIAGNOSIS — M792 Neuralgia and neuritis, unspecified: Secondary | ICD-10-CM

## 2022-02-15 DIAGNOSIS — R29898 Other symptoms and signs involving the musculoskeletal system: Secondary | ICD-10-CM

## 2022-02-15 MED ORDER — ROSUVASTATIN CALCIUM 40 MG PO TABS: ORAL_TABLET | ORAL | 1 refills | Status: DC

## 2022-02-15 MED ORDER — METOPROLOL TARTRATE 25 MG PO TABS
12.5000 mg | ORAL_TABLET | Freq: Two times a day (BID) | ORAL | 1 refills | Status: DC
Start: 2022-02-15 — End: 2022-09-12

## 2022-02-15 NOTE — Telephone Encounter (Signed)
Deanna [name], daughter called with concerns:  1.) Requesting refills for Metoprolol and Rouvastatin to be sent to [facility]. Rx sent.   2.) Saw Dinah on 11/16/2021 and a Neurology appointment was made. They called daughter but was very rude to her. Daughter is wanting another Neurology referral placed to a different office. Does not want to deal with the previous.   Requesting to place referral.

## 2022-02-15 NOTE — Telephone Encounter (Signed)
Another Neurologist referral order placed  May refill Metoprolol and Rosuvastatin

## 2022-02-17 ENCOUNTER — Telehealth: Payer: Self-pay | Admitting: *Deleted

## 2022-02-17 NOTE — Telephone Encounter (Signed)
  Note:   ----- Message ----- From: Sandrea Hughs, NP Sent: 02/17/2022   9:37 AM EDT To: Albertina Senegal Zoeann Mol, [place]; Barbaraann Rondo Subject: Neuro referral                                  Wendie Simmer, Thank you for the update on Ms.Dehner's Neuro referral.Will have our staff call to recommend Orthopedic referral and to follow up with current Neurologist.  Thank You    ----- Message ----- From: Barbaraann Rondo Sent: 02/16/2022   4:26 PM EDT To: Sandrea Hughs, NP   Hi Dinah,   This is [place] with [facility]. Thank you for the referral for Ms. Reeser. I went ahead and had the referral reviewed by Dr Rexene Alberts who she has previously seen at our office. Per her review, she is declining the referral and advising that the patient continue care with their current neurologist. In addition, she is also recommending that the patient be referred to orthopedics for shoulder and arm/hand pain evaluation.   Please let me know if I can be of further assistance.   Thanks!    Called and spoke with Daughter, [name]. She stated that that she will call and schedule an appointment. Stated that patient sees Dr. Mayer Camel, Ortho. And will also call their office to schedule an appointment for evaluation.

## 2022-02-21 ENCOUNTER — Other Ambulatory Visit: Payer: Self-pay

## 2022-02-21 NOTE — Patient Outreach (Signed)
[place] [facility]) Care Management  02/21/2022  DAMON BAISCH 1940/12/22 [id number]   Telephone call to daughter [name].  She states she cannot talk and asked that CM call another time.    Plan: RN CM will attempt again in the month of July.    [name], RN, MSN [facility] Care Management Care Management Coordinator Direct Line [phone] Toll Free: [phone]  Fax: [phone]

## 2022-02-28 ENCOUNTER — Other Ambulatory Visit: Payer: Self-pay

## 2022-02-28 NOTE — Patient Outreach (Signed)
[facility]) Care Management  02/28/2022  Deanna [name] 05/07/1941 [id number]   Attempt #4-Telephone call to daughter [name] for follow up. No answer.  Unable to leave a message.  Plan: RN CM will close case.

## 2022-04-05 ENCOUNTER — Encounter: Payer: Self-pay | Admitting: Nurse Practitioner

## 2022-04-07 ENCOUNTER — Telehealth: Payer: Self-pay | Admitting: *Deleted

## 2022-04-07 NOTE — Telephone Encounter (Signed)
Received fax from Darlina Guys with [place] [phone] Requesting  23 Order for CPAP Supplies to be signed and faxed back to Fax: [phone]  Heated tubing Nasal Mask Nasal Mask Pillows Headgear Chin Strap Disposable Filter Non Disposable Filter Humidifier Chamber.   Placed order in [place] folder to review and sign.

## 2022-04-13 ENCOUNTER — Encounter: Payer: Medicare HMO | Admitting: Adult Health

## 2022-04-13 ENCOUNTER — Encounter: Payer: Self-pay | Admitting: Adult Health

## 2022-04-14 ENCOUNTER — Ambulatory Visit (INDEPENDENT_AMBULATORY_CARE_PROVIDER_SITE_OTHER): Payer: Medicare HMO | Admitting: Psychiatry

## 2022-04-14 ENCOUNTER — Encounter: Payer: Self-pay | Admitting: Psychiatry

## 2022-04-14 ENCOUNTER — Ambulatory Visit (INDEPENDENT_AMBULATORY_CARE_PROVIDER_SITE_OTHER): Payer: Medicare HMO | Admitting: Adult Health

## 2022-04-14 ENCOUNTER — Encounter: Payer: Self-pay | Admitting: Adult Health

## 2022-04-14 VITALS — BP 144/86 | HR 82 | Temp 97.3°F | Ht 61.0 in | Wt 185.8 lb

## 2022-04-14 DIAGNOSIS — I10 Essential (primary) hypertension: Secondary | ICD-10-CM | POA: Diagnosis not present

## 2022-04-14 DIAGNOSIS — N1832 Chronic kidney disease, stage 3b: Secondary | ICD-10-CM | POA: Diagnosis not present

## 2022-04-14 DIAGNOSIS — F411 Generalized anxiety disorder: Secondary | ICD-10-CM

## 2022-04-14 DIAGNOSIS — F39 Unspecified mood [affective] disorder: Secondary | ICD-10-CM

## 2022-04-14 DIAGNOSIS — I2510 Atherosclerotic heart disease of native coronary artery without angina pectoris: Secondary | ICD-10-CM

## 2022-04-14 DIAGNOSIS — L219 Seborrheic dermatitis, unspecified: Secondary | ICD-10-CM

## 2022-04-14 MED ORDER — DIVALPROEX SODIUM ER 250 MG PO TB24: 250.0000 mg | ORAL_TABLET | Freq: Every day | ORAL | 1 refills | Status: DC

## 2022-04-14 MED ORDER — ESCITALOPRAM OXALATE 20 MG PO TABS: 20.0000 mg | ORAL_TABLET | Freq: Every day | ORAL | 0 refills | Status: DC

## 2022-04-14 MED ORDER — ALPRAZOLAM 0.5 MG PO TABS: 0.5000 mg | ORAL_TABLET | Freq: Every day | ORAL | 2 refills | Status: DC | PRN

## 2022-04-14 MED ORDER — KETOCONAZOLE 1 % EX SHAM: 10.0000 mL | MEDICATED_SHAMPOO | CUTANEOUS | 0 refills | Status: DC

## 2022-04-14 MED ORDER — BUSPIRONE HCL 15 MG PO TABS: 15.0000 mg | ORAL_TABLET | Freq: Two times a day (BID) | ORAL | 1 refills | Status: DC

## 2022-04-14 NOTE — Patient Instructions (Signed)
Seborrheic Dermatitis, Adult Seborrheic dermatitis is a skin disease that causes red, scaly patches. It usually occurs on the scalp, and it is often called dandruff. The patches may appear on other parts of the body. Skin patches tend to appear where there are many oil glands in the skin. Areas of the body that are commonly affected include the: Scalp. Ears. Eyebrows. Face. Bearded area of [employer]. Skin folds of the body, such as the armpits, groin, and buttocks. Chest. The condition may come and go for no known reason, and it is often long-lasting (chronic). What are the causes? The cause of this condition is not known. What increases the risk? The following factors may make you more likely to develop this condition: Having certain conditions, such as: HIV (human immunodeficiency virus). AIDS (acquired immunodeficiency syndrome). Parkinson's disease. Mood disorders, such as depression. Being 3-36 years old. What are the signs or symptoms? Symptoms of this condition include: Thick scales on the scalp. Redness on the face or in the armpits. Skin that is flaky. The flakes may be white or yellow. Skin that seems oily or dry but is not helped with moisturizers. Itching or burning in the affected areas. How is this diagnosed? This condition is diagnosed with a medical history and physical exam. A sample of your skin may be tested (skin biopsy). You may need to see a skin specialist (dermatologist). How is this treated? There is no cure for this condition, but treatment can help to manage the symptoms. You may get treatment to remove scales, lower the risk of skin infection, and reduce swelling or itching. Treatment may include: Creams that reduce skin yeast. Medicated shampoo. Moisturizing creams or ointments. Creams that reduce swelling and irritation (steroids). Follow these instructions at home: Apply over-the-counter and prescription medicines only as told by your health care  provider. Use any medicated shampoo, skin creams, or ointments only as told by your health care provider. Keep all follow-up visits as told by your health care provider. This is important. Contact a health care provider if: Your symptoms do not improve with treatment. Your symptoms get worse. You have new symptoms. Get help right away if: Your condition rapidly worsens with treatment. Summary Seborrheic dermatitis is a skin disease that causes red, scaly patches. Seborrheic dermatitis commonly affects the scalp, face, and skin folds. There is no cure for this condition, but treatment can help to manage the symptoms. This information is not intended to replace advice given to you by your health care provider. Make sure you discuss any questions you have with your health care provider. Document Revised: 10/06/2021 Document Reviewed: 05/02/2019 Elsevier Patient Education  [place].

## 2022-04-14 NOTE — Progress Notes (Signed)
[facility]  Provider:   Durenda Age DNP  Code Status:  Full Code  Goals of Care:     11/25/2021    2:47 PM  Advanced Directives  Does Patient Have a Medical Advance Directive? Yes     Chief Complaint  Patient presents with   Acute Visit    Complains of Itchy Scalp and Forearms. Discuss need for Shingles, Tdap and Covid or if patient refuses postpone.     HPI: Patient is a 81 y.o. female seen today for an acute visit for complaints of itchy scalp. She has a PMH of hypertension, hypothyroidism, CAD S/P MI, CHF, paroxysmal atrial fibrillation and CKD. She was accompanied today by daughter. She complains of itchy scalp. Daughter has applied hydrocortisone cream and olive oil to scalp.Scalp noted to have mild fine white scaliness. Daughter reported that she has been scratching her scalp and forearms.   BP 144/86, 1st check, 130/80 on second check, she takes Metoprolol tartrate 12.5 mg BID.  She denies having chest pains. She takes Plavix, ASA, NTG PRN and Crestor   Past Medical History:  Diagnosis Date   Anxiety    Bacteremia, escherichia coli 04/27/2015   Brachial-basilar insufficiency syndrome    CAD (coronary artery disease)    Celiac artery stenosis (HCC)    Chronic bilateral low back pain without sciatica    Chronic combined systolic (congestive) and diastolic (congestive) heart failure ([place]) 11/14/2017   CKD (chronic kidney disease), stage IV (HCC)    Cognitive impairment    Colon polyps    Community acquired pneumonia    Depression    Diverticulosis    Dysuria    Encephalomalacia    GAD (generalized anxiety disorder)    GERD (gastroesophageal reflux disease)    Glaucoma    Hearing loss    Hemorrhoids    Hypertension    Hypothyroidism    Incontinence    Mixed hyperlipidemia    Myocardial infarction (HCC)    OSA on CPAP    PAF (paroxysmal atrial fibrillation) ([place])    Peripheral arterial disease ([place])    Pneumonitis 04/26/2015   Pyelonephritis  due to Escherichia coli 04/28/2015   Sepsis ([place])    Spondylosis of lumbar region without myelopathy or radiculopathy    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[place])    Urticaria    Vertigo, benign positional     Past Surgical History:  Procedure Laterality Date   ABDOMINAL AORTOGRAM W/LOWER EXTREMITY N/A 08/22/2017   Procedure: ABDOMINAL AORTOGRAM W/LOWER EXTREMITY;  Surgeon: Serafina Mitchell, MD;  Location: [place] [facility];  Service: Cardiovascular;  Laterality: N/A;   BILATERAL UPPER EXTREMITY ANGIOGRAM N/A 09/11/2012   Procedure: BILATERAL UPPER EXTREMITY ANGIOGRAM;  Surgeon: Serafina Mitchell, MD;  Location: [facility] CATH LAB;  Service: Cardiovascular;  Laterality: N/A;   BIOPSY  03/25/2020   Procedure: BIOPSY;  Surgeon: Irving Copas., MD;  Location: [place];  Service: Gastroenterology;;   carotid duplex doppler Bilateral 09/03/2012, 11/03/2011   Evidence of 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 patent left common carotid- subclavian artery bypass with turbulent flow noted at the anastomosis with velocities of 295 cm/s   CAROTID-SUBCLAVIAN BYPASS GRAFT  12/15/2011   Procedure: BYPASS GRAFT CAROTID-SUBCLAVIAN;  Surgeon: Serafina Mitchell, MD;  Location: [facility];  Service: Vascular;  Laterality: Left;  Left Carotid subclavian bypass   CORONARY ANGIOPLASTY     CORONARY  ARTERY BYPASS GRAFT     CORONARY ATHERECTOMY N/A 03/19/2020   Procedure: CORONARY ATHERECTOMY;  Surgeon: Jettie Booze, MD;  Location: [place] [facility];  Service: Cardiovascular;  Laterality: N/A;   CORONARY STENT INTERVENTION N/A 03/19/2020   Procedure: CORONARY STENT INTERVENTION;  Surgeon: Jettie Booze, MD;  Location: [place] [facility];  Service: Cardiovascular;  Laterality: N/A;   DOPPLER ECHOCARDIOGRAPHY  05/27/2010, 09/17/2008   Mild Proximal septal thickening is  noted. Left ventricular systolic functions is normal ejection fraction =>55%. the aortic valve appears to be mildly sclerotic    ESOPHAGOGASTRODUODENOSCOPY (EGD) WITH PROPOFOL N/A 03/25/2020   Procedure: ESOPHAGOGASTRODUODENOSCOPY (EGD) WITH PROPOFOL;  Surgeon: Rush Landmark Telford Nab., MD;  Location: [place];  Service: Gastroenterology;  Laterality: N/A;   fem-fem bypass graft  1999   holter monitor  01/21/2008   The predominant rhythm was normal sinus rhythm. Minimum heartrate of 63 bpm at +01:00, maximum heartrate of 105 bpm at + 10:35; and the average heartrate of 75 bpm. Ventricular ectopic activity totaled 1270: Multifocal; 866-PVC's and 404-VEs              JOINT REPLACEMENT     Left knee   LEFT HEART CATH AND CORS/GRAFTS ANGIOGRAPHY N/A 03/19/2020   Procedure: LEFT HEART CATH AND CORS/GRAFTS ANGIOGRAPHY;  Surgeon: Jettie Booze, MD;  Location: [place] [facility];  Service: Cardiovascular;  Laterality: N/A;   LEFT HEART CATHETERIZATION WITH CORONARY/GRAFT ANGIOGRAM N/A 12/21/2011   Procedure: LEFT HEART CATHETERIZATION WITH Beatrix Fetters;  Surgeon: Leonie Man, MD;  Location: [facility] CATH LAB;  Service: Cardiovascular;  Laterality: N/A;   NM MYOCAR PERF EJECTION FRACTION  09/22/2009, 07/03/2007   the post stress myocardial perfusion images show a normal pattern of perfusion is all regions. The post-stress ejection fraction is 68 %. no significant wall motion abnormalities noted. This is a low risk scan.   PERIPHERAL VASCULAR INTERVENTION  08/22/2017   Procedure: PERIPHERAL VASCULAR INTERVENTION;  Surgeon: Serafina Mitchell, MD;  Location: [place] [facility];  Service: Cardiovascular;;  Fem/Fem Graft   REPLACEMENT TOTAL KNEE  05-2011   UNILATERAL UPPER EXTREMEITY ANGIOGRAM Left 11/15/2011   Procedure: UNILATERAL UPPER Anselmo Rod;  Surgeon: Lorretta Harp, MD;  Location: [facility] C[facility];  Service: Cardiovascular;  Laterality: Left;    Allergies  Allergen Reactions    Donepezil Other (See Comments)    Altered mood, aggression, and caused anger   Duloxetine Hcl     Increased confusion and memory concerns    Meloxicam     Pt suspects that this medicine causes fluid on her lungs.    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 1 by mouth in the morning and 1 by mouth in the pm.   ALPRAZolam (XANAX) 0.5 MG tablet Take 1 tablet (0.5 mg total) by mouth daily as needed for anxiety.   aspirin EC 81 MG tablet Take 81 mg by mouth at bedtime.    busPIRone (BUSPAR) 15 MG tablet Take 1 tablet (15 mg total) by mouth 2 (two) times daily.   cholecalciferol (VITAMIN D) 400 units TABS tablet  Take 400 Units by mouth daily.   clopidogrel (PLAVIX) 75 MG tablet TAKE 1 TABLET(75 MG) BY MOUTH DAILY   divalproex (DEPAKOTE ER) 250 MG 24 hr tablet Take 1 tablet (250 mg total) by mouth at bedtime.   dorzolamide (TRUSOPT) 2 % ophthalmic solution Place 1 drop into both eyes 2 (two) times daily.   escitalopram (LEXAPRO) 20 MG tablet Take 1 tablet (20 mg total) by mouth daily.   FARXIGA 10 MG TABS tablet TAKE 1 TABLET(10 MG) BY MOUTH DAILY   furosemide (LASIX) 40 MG tablet TAKE 1 TABLET(40 MG) BY MOUTH DAILY   gabapentin (NEURONTIN) 100 MG capsule Take 1 capsule (100 mg total) by mouth 2 (two) times daily as needed (for arm pain).   KETOCONAZOLE, TOPICAL, 1 % SHAM Apply 10 mLs topically 2 (two) times a week. Apply 10 ml to wet scalp, lather, leave on 3 to 5 minutes, and rinse; apply twice weekly for 4 weeks.   latanoprost (XALATAN) 0.005 % ophthalmic solution Place 1 drop into both eyes at bedtime.   levothyroxine  (SYNTHROID) 175 MCG tablet TAKE 1 TABLET BY MOUTH DAILY. TAKE AT THE SAME TIME DAILY SEPARATE FROM OTHER MEDICATIONS   meclizine (ANTIVERT) 25 MG tablet Take 1 tablet (25 mg total) by mouth as needed for dizziness.   metoprolol tartrate (LOPRESSOR) 25 MG tablet Take 0.5 tablets (12.5 mg total) by mouth 2 (two) times daily.   MYRBETRIQ 50 MG TB24 tablet 50 mg daily.   nitroGLYCERIN (NITROSTAT) 0.4 MG SL tablet Place 1 tablet (0.4 mg total) under the tongue every 5 (five) minutes x 3 doses as needed for chest pain.   pantoprazole (PROTONIX) 40 MG tablet TAKE 1 TABLET EVERY DAY   rosuvastatin (CRESTOR) 40 MG tablet TAKE 1 TABLET(40 MG) BY MOUTH DAILY   tiZANidine (ZANAFLEX) 2 MG tablet Take 1 tablet (2 mg total) by mouth every 6 (six) hours as needed for muscle spasms.   vitamin B-12 (CYANOCOBALAMIN) 1000 MCG tablet Take 1,000 mcg by mouth daily.   No facility-administered encounter medications on file as of 04/14/2022.    Review of Systems:  Review of Systems  Constitutional:  Negative for appetite change, chills, fatigue and fever.  HENT:  Negative for congestion, hearing loss, rhinorrhea and sore throat.   Eyes: Negative.   Respiratory:  Negative for cough, shortness of breath and wheezing.   Cardiovascular:  Negative for chest pain, palpitations and leg swelling.  Gastrointestinal:  Negative for abdominal pain, constipation, diarrhea, nausea and vomiting.  Genitourinary:  Negative for dysuria.  Musculoskeletal:  Negative for arthralgias, back pain and myalgias.  Skin:  Negative for color change, rash and wound.       Itchy scalp X 1.5 weeks  Neurological:  Negative for dizziness, weakness and headaches.  Psychiatric/Behavioral:  Negative for behavioral problems. The patient is not nervous/anxious.     Health Maintenance  Topic Date Due   TETANUS/TDAP  Never done   Zoster Vaccines- Shingrix (1 of 2) Never done   COVID-19 Vaccine (4 - Pfizer risk series) 10/13/2020   INFLUENZA VACCINE   03/08/2022   Pneumonia Vaccine 16+ Years old  Completed   DEXA SCAN  Completed   HPV VACCINES  Aged Out   COLONOSCOPY (Pts 45-26yr Insurance coverage will need to be confirmed)  Discontinued    Physical Exam: Vitals:   04/14/22 1010  BP: (!) 144/86  Pulse: 82  Temp: (!) 97.3 F (36.3 C)  TempSrc: Skin  SpO2: 94%  Weight: 185 lb  12.8 oz (84.3 kg)  Height: '5\' 1"'  (1.549 m)   Body mass index is 35.11 kg/m. Physical Exam Constitutional:      Appearance: She is obese.  HENT:     Head: Normocephalic and atraumatic.     Nose: Nose normal.     Mouth/Throat:     Mouth: Mucous membranes are moist.  Eyes:     Conjunctiva/sclera: Conjunctivae normal.  Cardiovascular:     Rate and Rhythm: Normal rate and regular rhythm.  Pulmonary:     Effort: Pulmonary effort is normal.     Breath sounds: Normal breath sounds.  Abdominal:     General: Bowel sounds are normal.     Palpations: Abdomen is soft.  Musculoskeletal:        General: Normal range of motion.     Cervical back: Normal range of motion.  Skin:    General: Skin is warm and dry.     Comments: mild fine white scaliness on scalp  Neurological:     Mental Status: She is alert and oriented to person, place, and time. Mental status is at baseline.  Psychiatric:        Mood and Affect: Mood normal.        Behavior: Behavior normal.        Thought Content: Thought content normal.        Judgment: Judgment normal.     Labs reviewed: Basic Metabolic Panel: Recent Labs    05/09/21 0759 05/10/21 0142 05/20/21 1159 06/24/21 0000 10/11/21 1539 10/29/21 1635 01/12/22 0000  NA 139   < > 138   < > 140 139 141  K 4.3   < > 4.0   < > 4.8 3.6 4.1  CL 104   < > 103   < > 98 103 99  CO2 21*   < > 26   < > 32 26 33*  GLUCOSE 215*   < > 95  --  102 115*  --   BUN 21   < > 38*   < > 23 26* 21  CREATININE 2.09*   < > 1.52*   < > 1.79* 1.69* 1.7*  CALCIUM 9.2   < > 9.6   < > 9.9 9.2 10.0  MG 1.8  --   --   --   --   --   --    TSH  --   --   --   --  2.47  --   --    < > = values in this interval not displayed.   Liver Function Tests: Recent Labs    05/09/21 0042 05/20/21 1159 06/24/21 0000 10/11/21 1539 01/12/22 0000  AST 16 10  --  13  --   ALT 11 8  --  10  --   ALKPHOS 66  --   --   --   --   BILITOT 1.1 0.7  --  1.0  --   PROT 6.2* 6.3  --  6.9  --   ALBUMIN 3.5  --  4.5  --  4.5   No results for input(s): "LIPASE", "AMYLASE" in the last 8760 hours. No results for input(s): "AMMONIA" in the last 8760 hours. CBC: Recent Labs    05/09/21 0042 05/10/21 0142 05/20/21 1159 06/24/21 0000 10/11/21 1539 10/29/21 1635 01/12/22 0000  WBC 8.9   < > 7.4  --  8.6 7.2 10.0  NEUTROABS 6.8  --  4,181  --  4,541  --   --  HGB 12.3   < > 12.5   < > 12.5 12.7 13.8  HCT 39.6   < > 39.4  --  39.5 40.1 42  MCV 88.6   < > 85.7  --  82.5 83.9  --   PLT 151   < > 165  --  206 175 195   < > = values in this interval not displayed.   Lipid Panel: Recent Labs    05/10/21 0142  CHOL 110  HDL 34*  LDLCALC 44  TRIG 160*  CHOLHDL 3.2   Lab Results  Component Value Date   HGBA1C 5.8 (H) 03/18/2020    Procedures since last visit: No results found.  Assessment/Plan  1. Seborrheic dermatitis - KETOCONAZOLE, TOPICAL, 1 % SHAM; Apply 10 mLs topically 2 (two) times a week. Apply 10 ml to wet scalp, lather, leave on 3 to 5 minutes, and rinse; apply twice weekly for 4 weeks.  Dispense: 200 mL; Refill: 0  2. Essential hypertension -  BP stable, continue Metoprolol tartrate -  instructed to monitor BP and record daily  3. Coronary artery disease involving native coronary artery of native heart without angina pectoris -  stable, continue Plavix, ASA, Rosuvastatin and NTG PRN  4. Stage 3b chronic kidney disease ([place]) Lab Results  Component Value Date   NA 141 01/12/2022   K 4.1 01/12/2022   CO2 33 (A) 01/12/2022   GLUCOSE 115 (H) 10/29/2021   BUN 21 01/12/2022   CREATININE 1.7 (A) 01/12/2022    CALCIUM 10.0 01/12/2022   EGFR 30 01/12/2022   GFRNONAA 31 (L) 10/29/2021   -  stable, continue Farxiga    Labs/tests ordered:    None  Next appt:  04/21/2022

## 2022-04-14 NOTE — Progress Notes (Signed)
[name] [id number] 1940/11/17 81 y.o.  Subjective:   Patient ID:  Deanna [name] is a 81 y.o. (DOB June 02, 1941) female.  Chief Complaint:  Chief Complaint  Patient presents with   Anxiety    HPI Deanna [name] presents to the office today for follow-up of anxiety, mood disturbance, and insomnia. Accompanied by her daughter. Pt reports that she has a "little anxiety." Daughter reports that every evening around 4:30-5 pm pt has some anxiety and agitation. She will repeatedly call for her cat, even when cat is nearby. Daughter reports that "little things become big things." Pt reports that she feels irritable in the evening. They reports that she does not become physically agitated. Daughter reports that she has been having to give her Alprazolam prn most evenings (about 80% of the time). Both pt and her daughter report Alprazolam is effective for her anxiety and agitation. Daughter reports that pt will pick at her skin.   Pt denies sad mood. Daughter reports that pt has very low motivation and does not initiate things. Daughter reports that pt will not change the TV on her own. Pt agrees that her motivation and energy have been low. She denies anhedonia and enjoys watching TV. Daughter reports that pt has lost weight. Daughter reports that pt sleeps often and pt reports that she frequently dozes off in front of the TV. Daughter estimates pt will sleep 14 hours total in a 24-hour period. Sleeping about 12 hours a night. They report that this has been her pattern for at least 6 months. Appetite has been less. She reports difficulty with focus. Daughter reports that pt has been having increased difficulty with memory. Denies SI.   Daughter reports that she had a lapse in Lexapro and noticed pt was more irritable.   She reports that she has been "exhausted" due to the heat.   Daughter reports that pt has been less sleepy since they decreased Gabapentin.   Xanax last filled 01/25/22.   Past  Psychiatric Medication Trials: Sertraline- initially helpful and then no longer effective Cymbalta- increased blood pressure Prozac-possible adverse reaction Lexapro Depakote Ativan-effective but caused excessive drowsiness Xanax-effective Aricept-anger Namenda- anger Gabapentin- Some improvement in pain and anxiety  Mini-Mental    Flowsheet Row Office Visit from 06/15/2021 in [facility] from 03/07/2018 in [facility] from 05/30/2014 in [facility]  Total Score (max 30 points ) '26 29 29      '$ PHQ2-9    Flowsheet Row Clinical Support from 04/15/2021 in [facility] and [place] Visit from 05/06/2020 in [facility] and [place] Visit from 11/29/2017 in [facility] Patient Outreach from 11/17/2015 in [place] Patient Outreach from 10/15/2015 in [place]  PHQ-2 Total Score 0 0 '6 1 1  '$ PHQ-9 Total Score -- -- 18 -- --      [place] from 10/31/2021 in [place] [facility] from 10/29/2021 in [place] [facility] to Hosp-Admission (Discharged) from 05/09/2021 in [facility]  C-SSRS RISK CATEGORY No Risk No Risk No Risk        Review of Systems:  Review of Systems  Musculoskeletal:  Positive for gait problem.  Skin:  Positive for rash.  Neurological:  Positive for tremors.       Intermittent tremor. Pt and daughter report tremor has improved overall.   Psychiatric/Behavioral:  Please refer to HPI    Medications: I have reviewed the patient's current medications.  Current Outpatient Medications  Medication Sig Dispense Refill   acetaminophen (TYLENOL) 325 MG tablet Take 325 mg by mouth in the morning and at bedtime. 1 by mouth in the morning and 1 by mouth in the pm.     aspirin EC 81 MG tablet Take 81 mg by mouth at  bedtime.      cholecalciferol (VITAMIN D) 400 units TABS tablet Take 400 Units by mouth daily.     clopidogrel (PLAVIX) 75 MG tablet TAKE 1 TABLET(75 MG) BY MOUTH DAILY 90 tablet 1   dorzolamide (TRUSOPT) 2 % ophthalmic solution Place 1 drop into both eyes 2 (two) times daily.     FARXIGA 10 MG TABS tablet TAKE 1 TABLET(10 MG) BY MOUTH DAILY 30 tablet 5   furosemide (LASIX) 40 MG tablet TAKE 1 TABLET(40 MG) BY MOUTH DAILY 90 tablet 1   gabapentin (NEURONTIN) 100 MG capsule Take 1 capsule (100 mg total) by mouth 2 (two) times daily as needed (for arm pain). 30 capsule 1   latanoprost (XALATAN) 0.005 % ophthalmic solution Place 1 drop into both eyes at bedtime.     levothyroxine (SYNTHROID) 175 MCG tablet TAKE 1 TABLET BY MOUTH DAILY. TAKE AT THE SAME TIME DAILY SEPARATE FROM OTHER MEDICATIONS 90 tablet 1   meclizine (ANTIVERT) 25 MG tablet Take 1 tablet (25 mg total) by mouth as needed for dizziness. 30 tablet 0   metoprolol tartrate (LOPRESSOR) 25 MG tablet Take 0.5 tablets (12.5 mg total) by mouth 2 (two) times daily. 90 tablet 1   pantoprazole (PROTONIX) 40 MG tablet TAKE 1 TABLET EVERY DAY 90 tablet 1   rosuvastatin (CRESTOR) 40 MG tablet TAKE 1 TABLET(40 MG) BY MOUTH DAILY 90 tablet 1   vitamin B-12 (CYANOCOBALAMIN) 1000 MCG tablet Take 1,000 mcg by mouth daily.     ALPRAZolam (XANAX) 0.5 MG tablet Take 1 tablet (0.5 mg total) by mouth daily as needed for anxiety. 30 tablet 2   busPIRone (BUSPAR) 15 MG tablet Take 1 tablet (15 mg total) by mouth 2 (two) times daily. 180 tablet 1   divalproex (DEPAKOTE ER) 250 MG 24 hr tablet Take 1 tablet (250 mg total) by mouth at bedtime. 90 tablet 1   escitalopram (LEXAPRO) 20 MG tablet Take 1 tablet (20 mg total) by mouth daily. 90 tablet 0   KETOCONAZOLE, TOPICAL, 1 % SHAM Apply 10 mLs topically 2 (two) times a week. Apply 10 ml to wet scalp, lather, leave on 3 to 5 minutes, and rinse; apply twice weekly for 4 weeks. 200 mL 0   nitroGLYCERIN (NITROSTAT)  0.4 MG SL tablet Place 1 tablet (0.4 mg total) under the tongue every 5 (five) minutes x 3 doses as needed for chest pain. 25 tablet 1   No current facility-administered medications for this visit.    Medication Side Effects: None  Allergies:  Allergies  Allergen Reactions   Donepezil Other (See Comments)    Altered mood, aggression, and caused anger   Duloxetine Hcl     Increased confusion and memory concerns    Meloxicam     Pt suspects that this medicine causes fluid on her lungs.    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    Vicodin [Hydrocodone-Acetaminophen] Other (See Comments)    Delirium, confusion,  and toxic dementia    Past Medical History:  Diagnosis Date   Anxiety    Bacteremia, escherichia coli 04/27/2015   Brachial-basilar insufficiency syndrome    CAD (coronary artery disease)    Celiac artery stenosis (HCC)    Chronic bilateral low back pain without sciatica    Chronic combined systolic (congestive) and diastolic (congestive) heart failure ([place]) 11/14/2017   CKD (chronic kidney disease), stage IV (HCC)    Cognitive impairment    Colon polyps    Community acquired pneumonia    Depression    Diverticulosis    Dysuria    Encephalomalacia    GAD (generalized anxiety disorder)    GERD (gastroesophageal reflux disease)    Glaucoma    Hearing loss    Hemorrhoids    Hypertension    Hypothyroidism    Incontinence    Mixed hyperlipidemia    Myocardial infarction (HCC)    OSA on CPAP    PAF (paroxysmal atrial fibrillation) ([place])    Peripheral arterial disease ([place])    Pneumonitis 04/26/2015   Pyelonephritis due to Escherichia coli 04/28/2015   Sepsis ([place])    Spondylosis of lumbar region without myelopathy or radiculopathy    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[place])    Urticaria    Vertigo, benign positional     Past Medical History, Surgical  history, Social history, and Family history were reviewed and updated as appropriate.   Please see review of systems for further details on the patient's review from today.   Objective:   Physical Exam:  Wt 185 lb (83.9 kg)   BMI 34.96 kg/m   Physical Exam Constitutional:      General: She is not in acute distress. Musculoskeletal:     Comments: Pt in w/c  Neurological:     Mental Status: She is alert and oriented to person, place, and time.     Coordination: Coordination normal.  Psychiatric:        Attention and Perception: Attention and perception normal. She does not perceive auditory or visual hallucinations.        Mood and Affect: Mood is not depressed. Affect is not labile, blunt, angry or inappropriate.        Speech: Speech normal.        Behavior: Behavior normal.        Thought Content: Thought content normal. Thought content is not paranoid or delusional. Thought content does not include homicidal or suicidal ideation. Thought content does not include homicidal or suicidal plan.        Cognition and Memory: Cognition is impaired. Memory is impaired.     Comments: Insight fair. Judgment is fair to poor Mood is mildly anxious     Lab Review:     Component Value Date/Time   NA 141 01/12/2022 0000   K 4.1 01/12/2022 0000   CL 99 01/12/2022 0000   CO2 33 (A) 01/12/2022 0000   GLUCOSE 115 (H) 10/29/2021 1635   BUN 21 01/12/2022 0000   CREATININE 1.7 (A) 01/12/2022 0000   CREATININE 1.69 (H) 10/29/2021 1635   CREATININE 1.79 (H) 10/11/2021 1539   CALCIUM 10.0 01/12/2022 0000   PROT 6.9 10/11/2021 1539   ALBUMIN 4.5 01/12/2022 0000   AST 13 10/11/2021 1539   ALT 10 10/11/2021 1539   ALKPHOS 66 05/09/2021 0042   BILITOT 1.0 10/11/2021 1539   GFRNONAA 31 (L) 10/29/2021 1635   GFRNONAA 30 (L) 12/07/2020 1356   GFRAA 35 (L) 12/07/2020 1356  Component Value Date/Time   WBC 10.0 01/12/2022 0000   WBC 7.2 10/29/2021 1635   RBC 5.11 01/12/2022 0000   HGB  13.8 01/12/2022 0000   HGB 10.2 (L) 04/22/2020 1118   HCT 42 01/12/2022 0000   HCT 30.6 (L) 04/22/2020 1118   PLT 195 01/12/2022 0000   PLT 108 (L) 04/22/2020 1118   MCV 83.9 10/29/2021 1635   MCV 86 04/22/2020 1118   MCH 26.6 10/29/2021 1635   MCHC 31.7 10/29/2021 1635   RDW 14.8 10/29/2021 1635   RDW 13.5 04/22/2020 1118   LYMPHSABS 3,345 10/11/2021 1539   MONOABS 0.5 05/09/2021 0042   EOSABS 69 10/11/2021 1539   BASOSABS 43 10/11/2021 1539    No results found for: "POCLITH", "LITHIUM"   No results found for: "PHENYTOIN", "PHENOBARB", "VALPROATE", "CBMZ"   .res Assessment: Plan:   Pt seen for 30 minutes and time spent discussing increased anxiety and mild agitation that pt experiences most days in the late afternoon/early evening and strategies to possibly improve this. Discussed that they may wish to try Depakote earlier in the evening, ie. 4 pm instead of bedtime, to determine if this helps with sundowning without causing excessive somnolence. Discussed that it may also be helpful to try administering Ativan at 4 pm prior to the usual onset of symptoms to possibly prevent or minimize anxiety and agitation in the evening.  Will continue Depakote Er 250 mg in the evening for mood stabilization and agitation.  Continue Buspar 15 mg po BID for anxiety.  Continue Lexapro 20 mg po qd for depression and anxiety.  Continue Xanax 0.5 mg po qd prn anxiety.  Pt to follow-up in 6 months or sooner if clinically indicated.  Patient advised to contact office with any questions, adverse effects, or acute worsening in signs and symptoms.   Ezell was seen today for anxiety.  Diagnoses and all orders for this visit:  Mood disorder ([place]) -     escitalopram (LEXAPRO) 20 MG tablet; Take 1 tablet (20 mg total) by mouth daily. -     divalproex (DEPAKOTE ER) 250 MG 24 hr tablet; Take 1 tablet (250 mg total) by mouth at bedtime.  Generalized anxiety disorder -     escitalopram (LEXAPRO) 20 MG  tablet; Take 1 tablet (20 mg total) by mouth daily. -     busPIRone (BUSPAR) 15 MG tablet; Take 1 tablet (15 mg total) by mouth 2 (two) times daily. -     ALPRAZolam (XANAX) 0.5 MG tablet; Take 1 tablet (0.5 mg total) by mouth daily as needed for anxiety.     Please see After Visit Summary for patient specific instructions.  Future Appointments  Date Time Provider [place]  04/21/2022  9:40 AM Lauree Chandler, NP [facility] None  04/25/2022  2:40 PM Lauree Chandler, NP [facility] None  06/08/2022  3:00 PM Lorretta Harp, MD [facility] None  06/15/2022 11:30 AM Rondel Jumbo, PA-C [facility] None  10/13/2022  2:45 PM Thayer Headings, PMHNP [facility] None    No orders of the defined types were placed in this encounter.   -------------------------------

## 2022-04-18 ENCOUNTER — Ambulatory Visit: Payer: Medicare HMO | Admitting: Nurse Practitioner

## 2022-04-19 ENCOUNTER — Encounter: Payer: Medicare HMO | Admitting: Nurse Practitioner

## 2022-04-20 ENCOUNTER — Encounter: Payer: Self-pay | Admitting: Nurse Practitioner

## 2022-04-20 NOTE — Progress Notes (Signed)
This encounter was created in error - please disregard.

## 2022-04-20 NOTE — Progress Notes (Unsigned)
   This service is provided via telemedicine  No vital signs collected/recorded due to the encounter was a telemedicine visit.   Location of patient (ex: home, work):  Home  Patient consents to a telephone visit: Yes, see telephone visit dated 04/21/2022  Location of the provider (ex: office, home):  [place], [place]   Name of any referring provider:  N/A  Names of all persons participating in the telemedicine service and their role in the encounter:  S.Chrae B/CMA, Sherrie Mustache, NP, and Patient   Time spent on call:  9 min with medical assistant

## 2022-04-21 ENCOUNTER — Encounter: Payer: Medicare HMO | Admitting: Nurse Practitioner

## 2022-04-25 ENCOUNTER — Ambulatory Visit: Payer: Medicare HMO | Admitting: Nurse Practitioner

## 2022-05-04 ENCOUNTER — Other Ambulatory Visit: Payer: Self-pay | Admitting: Nurse Practitioner

## 2022-05-12 IMAGING — DX DG CHEST 1V PORT
1 series · 1 of 1 positions shown · non-contrast
Comparison: Chest x-ray 05/09/2021.

CLINICAL DATA: 80-year-old female with history of shortness of
breath and respiratory distress.

EXAM:
PORTABLE CHEST 1 VIEW

[chest ap]
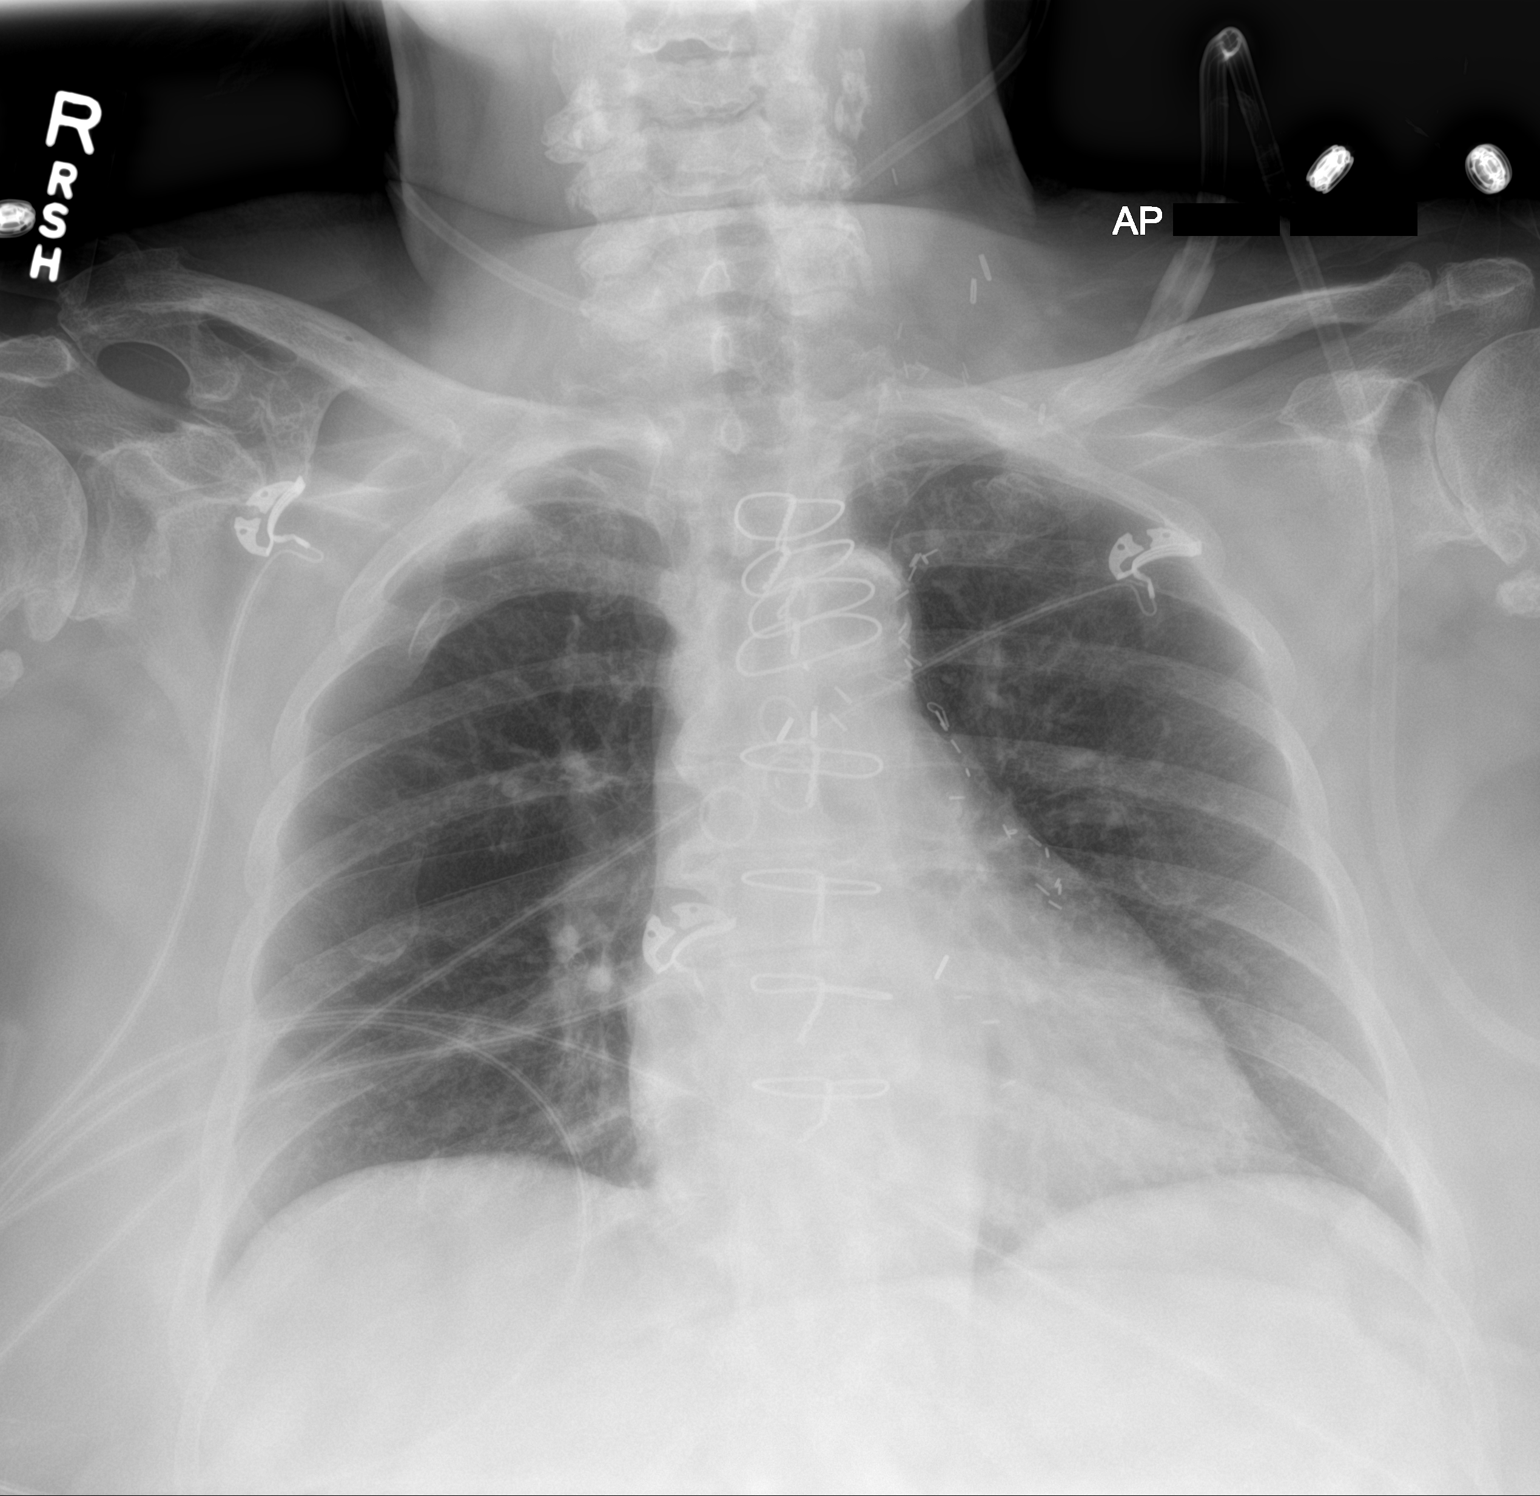

[1 of 1 positions shown; findings below may reference images not displayed]

FINDINGS: Lung volumes are low. No consolidative airspace disease. No pleural
effusions. No pneumothorax. No pulmonary nodule or mass noted.
Pulmonary vasculature and the cardiomediastinal silhouette are
within normal limits. Atherosclerotic calcifications in the thoracic
aorta. Status post median sternotomy for CABG including [REDACTED] to the
LAD. Multiple surgical clips are noted in the lower left cervical
region. Old healed right-sided rib fractures are again noted.
Ossification of the coracoclavicular ligament and elevation of the
distal right clavicle relative to the acromion, with old healed
distal right clavicular fracture, related to remote trauma.
IMPRESSION: 1. No radiographic evidence of acute cardiopulmonary disease.
2. Aortic atherosclerosis.

## 2022-05-16 ENCOUNTER — Encounter: Payer: Self-pay | Admitting: Nurse Practitioner

## 2022-05-16 ENCOUNTER — Ambulatory Visit (INDEPENDENT_AMBULATORY_CARE_PROVIDER_SITE_OTHER): Payer: Medicare HMO | Admitting: Nurse Practitioner

## 2022-05-16 DIAGNOSIS — E2839 Other primary ovarian failure: Secondary | ICD-10-CM | POA: Diagnosis not present

## 2022-05-16 DIAGNOSIS — Z0000 Encounter for general adult medical examination without abnormal findings: Secondary | ICD-10-CM

## 2022-05-16 NOTE — Patient Instructions (Signed)
Deanna [name] , Thank you for taking time to come for your Medicare Wellness Visit. I appreciate your ongoing commitment to your health goals. Please review the following plan we discussed and let me know if I can assist you in the future.   Screening recommendations/referrals: Colonoscopy aged out Mammogram aged out Bone Density ordered today to call [phone] Recommended yearly ophthalmology/optometry visit for glaucoma screening and checkup Recommended yearly dental visit for hygiene and checkup  Vaccinations: Influenza vaccine- due annually in September/October Pneumococcal vaccine up to date Tdap vaccine DUE- recommend to get at your local pharmacy Shingles vaccine -DUE- recommend to get at your local pharmacy    Advanced directives: recommended to complete and put on file.   Conditions/risks identified: advance age, progressive memory loss, hyperlipidemia.   Next appointment: yearly- next in person   Preventive Care 43 Years and Older, Female Preventive care refers to lifestyle choices and visits with your health care provider that can promote health and wellness. What does preventive care include? A yearly physical exam. This is also called an annual well check. Dental exams once or twice a year. Routine eye exams. Ask your health care provider how often you should have your eyes checked. Personal lifestyle choices, including: Daily care of your teeth and gums. Regular physical activity. Eating a healthy diet. Avoiding tobacco and drug use. Limiting alcohol use. Practicing safe sex. Taking low-dose aspirin every day. Taking vitamin and mineral supplements as recommended by your health care provider. What happens during an annual well check? The services and screenings done by your health care provider during your annual well check will depend on your age, overall health, lifestyle risk factors, and family history of disease. Counseling  Your health care provider may  ask you questions about your: Alcohol use. Tobacco use. Drug use. Emotional well-being. Home and relationship well-being. Sexual activity. Eating habits. History of falls. Memory and ability to understand (cognition). Work and work Statistician. Reproductive health. Screening  You may have the following tests or measurements: Height, weight, and BMI. Blood pressure. Lipid and cholesterol levels. These may be checked every 5 years, or more frequently if you are over 53 years old. Skin check. Lung cancer screening. You may have this screening every year starting at age 92 if you have a 30-pack-year history of smoking and currently smoke or have quit within the past 15 years. Fecal occult blood test (FOBT) of the stool. You may have this test every year starting at age 87. Flexible sigmoidoscopy or colonoscopy. You may have a sigmoidoscopy every 5 years or a colonoscopy every 10 years starting at age 77. Hepatitis C blood test. Hepatitis B blood test. Sexually transmitted disease (STD) testing. Diabetes screening. This is done by checking your blood sugar (glucose) after you have not eaten for a while (fasting). You may have this done every 1-3 years. Bone density scan. This is done to screen for osteoporosis. You may have this done starting at age 55. Mammogram. This may be done every 1-2 years. Talk to your health care provider about how often you should have regular mammograms. Talk with your health care provider about your test results, treatment options, and if necessary, the need for more tests. Vaccines  Your health care provider may recommend certain vaccines, such as: Influenza vaccine. This is recommended every year. Tetanus, diphtheria, and acellular pertussis (Tdap, Td) vaccine. You may need a Td booster every 10 years. Zoster vaccine. You may need this after age 42. Pneumococcal 13-valent conjugate (PCV13) vaccine.  One dose is recommended after age 68. Pneumococcal  polysaccharide (PPSV23) vaccine. One dose is recommended after age 36. Talk to your health care provider about which screenings and vaccines you need and how often you need them. This information is not intended to replace advice given to you by your health care provider. Make sure you discuss any questions you have with your health care provider. Document Released: 08/21/2015 Document Revised: 04/13/2016 Document Reviewed: 05/26/2015 Elsevier Interactive Patient Education  2017 [place] Prevention in the Home Falls can cause injuries. They can happen to people of all ages. There are many things you can do to make your home safe and to help prevent falls. What can I do on the outside of my home? Regularly fix the edges of walkways and driveways and fix any cracks. Remove anything that might make you trip as you walk through a door, such as a raised step or threshold. Trim any bushes or trees on the path to your home. Use bright outdoor lighting. Clear any walking paths of anything that might make someone trip, such as rocks or tools. Regularly check to see if handrails are loose or broken. Make sure that both sides of any steps have handrails. Any raised decks and porches should have guardrails on the edges. Have any leaves, snow, or ice cleared regularly. Use sand or salt on walking paths during winter. Clean up any spills in your garage right away. This includes oil or grease spills. What can I do in the bathroom? Use night lights. Install grab bars by the toilet and in the tub and shower. Do not use towel bars as grab bars. Use non-skid mats or decals in the tub or shower. If you need to sit down in the shower, use a plastic, non-slip stool. Keep the floor dry. Clean up any water that spills on the floor as soon as it happens. Remove soap buildup in the tub or shower regularly. Attach bath mats securely with double-sided non-slip rug tape. Do not have throw rugs and other  things on the floor that can make you trip. What can I do in the bedroom? Use night lights. Make sure that you have a light by your bed that is easy to reach. Do not use any sheets or blankets that are too big for your bed. They should not hang down onto the floor. Have a firm chair that has side arms. You can use this for support while you get dressed. Do not have throw rugs and other things on the floor that can make you trip. What can I do in the kitchen? Clean up any spills right away. Avoid walking on wet floors. Keep items that you use a lot in easy-to-reach places. If you need to reach something above you, use a strong step stool that has a grab bar. Keep electrical cords out of the way. Do not use floor polish or wax that makes floors slippery. If you must use wax, use non-skid floor wax. Do not have throw rugs and other things on the floor that can make you trip. What can I do with my stairs? Do not leave any items on the stairs. Make sure that there are handrails on both sides of the stairs and use them. Fix handrails that are broken or loose. Make sure that handrails are as long as the stairways. Check any carpeting to make sure that it is firmly attached to the stairs. Fix any carpet that is loose or  worn. Avoid having throw rugs at the top or bottom of the stairs. If you do have throw rugs, attach them to the floor with carpet tape. Make sure that you have a light switch at the top of the stairs and the bottom of the stairs. If you do not have them, ask someone to add them for you. What else can I do to help prevent falls? Wear shoes that: Do not have high heels. Have rubber bottoms. Are comfortable and fit you well. Are closed at the toe. Do not wear sandals. If you use a stepladder: Make sure that it is fully opened. Do not climb a closed stepladder. Make sure that both sides of the stepladder are locked into place. Ask someone to hold it for you, if possible. Clearly  mark and make sure that you can see: Any grab bars or handrails. First and last steps. Where the edge of each step is. Use tools that help you move around (mobility aids) if they are needed. These include: Canes. Walkers. Scooters. Crutches. Turn on the lights when you go into a dark area. Replace any light bulbs as soon as they burn out. Set up your furniture so you have a clear path. Avoid moving your furniture around. If any of your floors are uneven, fix them. If there are any pets around you, be aware of where they are. Review your medicines with your doctor. Some medicines can make you feel dizzy. This can increase your chance of falling. Ask your doctor what other things that you can do to help prevent falls. This information is not intended to replace advice given to you by your health care provider. Make sure you discuss any questions you have with your health care provider. Document Released: 05/21/2009 Document Revised: 12/31/2015 Document Reviewed: 08/29/2014 Elsevier Interactive Patient Education  2017 [employer].

## 2022-05-16 NOTE — Progress Notes (Signed)
Subjective:   Deanna [name] is a 81 y.o. female who presents for Medicare Annual (Subsequent) preventive examination.  Review of Systems           Objective:    There were no vitals filed for this visit. There is no height or weight on file to calculate BMI.     04/20/2022   11:58 AM 11/25/2021    2:47 PM 11/17/2021   10:14 AM 11/16/2021    1:58 PM 11/10/2021   11:33 AM 11/02/2021    1:53 PM 10/11/2021    2:40 PM  Advanced Directives  Does Patient Have a Medical Advance Directive? No Yes Yes Yes Yes Yes Yes  Type of Chiropodist Power of [place]  Does patient want to make changes to medical advance directive?    No - Patient declined No - Patient declined No - Patient declined No - Patient declined  Copy of [place] in Chart?      Yes - validated most recent copy scanned in chart (See row information) Yes - validated most recent copy scanned in chart (See row information)  Would patient like information on creating a medical advance directive? No - Patient declined          Current Medications (verified) Outpatient Encounter Medications as of 05/16/2022  Medication Sig   acetaminophen (TYLENOL) 325 MG tablet Take 325 mg by mouth in the morning and at bedtime. 1 by mouth in the morning and 1 by mouth in the pm.   ALPRAZolam (XANAX) 0.5 MG tablet Take 1 tablet (0.5 mg total) by mouth daily as needed for anxiety.   aspirin EC 81 MG tablet Take 81 mg by mouth at bedtime.    busPIRone (BUSPAR) 15 MG tablet Take 1 tablet (15 mg total) by mouth 2 (two) times daily.   cholecalciferol (VITAMIN D) 400 units TABS tablet Take 400 Units by mouth daily.   clopidogrel (PLAVIX) 75 MG tablet TAKE 1 TABLET(75 MG) BY MOUTH DAILY   dapagliflozin propanediol (FARXIGA) 10 MG TABS tablet TAKE 1 TABLET(10 MG) BY MOUTH DAILY   divalproex (DEPAKOTE ER)  250 MG 24 hr tablet Take 1 tablet (250 mg total) by mouth at bedtime.   dorzolamide (TRUSOPT) 2 % ophthalmic solution Place 1 drop into both eyes 2 (two) times daily.   escitalopram (LEXAPRO) 20 MG tablet Take 1 tablet (20 mg total) by mouth daily.   furosemide (LASIX) 40 MG tablet TAKE 1 TABLET(40 MG) BY MOUTH DAILY   gabapentin (NEURONTIN) 100 MG capsule TAKE 2 CAPSULES(200 MG) BY MOUTH DAILY   latanoprost (XALATAN) 0.005 % ophthalmic solution Place 1 drop into both eyes at bedtime.   levothyroxine (SYNTHROID) 175 MCG tablet TAKE 1 TABLET BY MOUTH DAILY. TAKE AT THE SAME TIME DAILY SEPARATE FROM OTHER MEDICATIONS   meclizine (ANTIVERT) 25 MG tablet Take 1 tablet (25 mg total) by mouth as needed for dizziness.   metoprolol tartrate (LOPRESSOR) 25 MG tablet Take 0.5 tablets (12.5 mg total) by mouth 2 (two) times daily.   nitroGLYCERIN (NITROSTAT) 0.4 MG SL tablet Place 1 tablet (0.4 mg total) under the tongue every 5 (five) minutes x 3 doses as needed for chest pain.   pantoprazole (PROTONIX) 40 MG tablet TAKE 1 TABLET EVERY DAY   rosuvastatin (CRESTOR) 40 MG tablet TAKE 1 TABLET(40 MG) BY MOUTH DAILY   vitamin B-12 (CYANOCOBALAMIN)  1000 MCG tablet Take 1,000 mcg by mouth daily.   No facility-administered encounter medications on file as of 05/16/2022.    Allergies (verified) Donepezil, Duloxetine hcl, Meloxicam, Memantine, Oxycodone, and Vicodin [hydrocodone-acetaminophen]   History: Past Medical History:  Diagnosis Date   Anxiety    Bacteremia, escherichia coli 04/27/2015   Brachial-basilar insufficiency syndrome    CAD (coronary artery disease)    Celiac artery stenosis (HCC)    Chronic bilateral low back pain without sciatica    Chronic combined systolic (congestive) and diastolic (congestive) heart failure ([place]) 11/14/2017   CKD (chronic kidney disease), stage IV (HCC)    Cognitive impairment    Colon polyps    Community acquired pneumonia    Depression    Diverticulosis     Dysuria    Encephalomalacia    GAD (generalized anxiety disorder)    GERD (gastroesophageal reflux disease)    Glaucoma    Hearing loss    Hemorrhoids    Hypertension    Hypothyroidism    Incontinence    Mixed hyperlipidemia    Myocardial infarction (HCC)    OSA on CPAP    PAF (paroxysmal atrial fibrillation) ([place])    Peripheral arterial disease ([place])    Pneumonitis 04/26/2015   Pyelonephritis due to Escherichia coli 04/28/2015   Sepsis ([place])    Spondylosis of lumbar region without myelopathy or radiculopathy    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[place])    Urticaria    Vertigo, benign positional    Past Surgical History:  Procedure Laterality Date   ABDOMINAL AORTOGRAM W/LOWER EXTREMITY N/A 08/22/2017   Procedure: ABDOMINAL AORTOGRAM W/LOWER EXTREMITY;  Surgeon: Serafina Mitchell, MD;  Location: [place] [facility];  Service: Cardiovascular;  Laterality: N/A;   BILATERAL UPPER EXTREMITY ANGIOGRAM N/A 09/11/2012   Procedure: BILATERAL UPPER EXTREMITY ANGIOGRAM;  Surgeon: Serafina Mitchell, MD;  Location: [facility] C[facility] LAB;  Service: Cardiovascular;  Laterality: N/A;   BIOPSY  03/25/2020   Procedure: BIOPSY;  Surgeon: Irving Copas., MD;  Location: [place];  Service: Gastroenterology;;   carotid duplex doppler Bilateral 09/03/2012, 11/03/2011   Evidence of 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 patent left common carotid- subclavian artery bypass with turbulent flow noted at the anastomosis with velocities of 295 cm/s   CAROTID-SUBCLAVIAN BYPASS GRAFT  12/15/2011   Procedure: BYPASS GRAFT CAROTID-SUBCLAVIAN;  Surgeon: Serafina Mitchell, MD;  Location: [facility];  Service: Vascular;  Laterality: Left;  Left Carotid subclavian bypass   CORONARY ANGIOPLASTY     CORONARY ARTERY BYPASS GRAFT     CORONARY ATHERECTOMY N/A 03/19/2020   Procedure: CORONARY  ATHERECTOMY;  Surgeon: Jettie Booze, MD;  Location: [place] [facility];  Service: Cardiovascular;  Laterality: N/A;   CORONARY STENT INTERVENTION N/A 03/19/2020   Procedure: CORONARY STENT INTERVENTION;  Surgeon: Jettie Booze, MD;  Location: [place] [facility];  Service: Cardiovascular;  Laterality: N/A;   DOPPLER ECHOCARDIOGRAPHY  05/27/2010, 09/17/2008   Mild Proximal septal thickening is noted. Left ventricular systolic functions is normal ejection fraction =>55%. the aortic valve appears to be mildly sclerotic    ESOPHAGOGASTRODUODENOSCOPY (EGD) WITH PROPOFOL N/A 03/25/2020   Procedure: ESOPHAGOGASTRODUODENOSCOPY (EGD) WITH PROPOFOL;  Surgeon: Rush Landmark Telford Nab., MD;  Location: [facility];  Service: Gastroenterology;  Laterality: N/A;   fem-fem bypass graft  1999   holter monitor  01/21/2008   The predominant rhythm was normal sinus  rhythm. Minimum heartrate of 63 bpm at +01:00, maximum heartrate of 105 bpm at + 10:35; and the average heartrate of 75 bpm. Ventricular ectopic activity totaled 1270: Multifocal; 866-PVC's and 404-VEs              JOINT REPLACEMENT     Left knee   LEFT HEART CATH AND CORS/GRAFTS ANGIOGRAPHY N/A 03/19/2020   Procedure: LEFT HEART CATH AND CORS/GRAFTS ANGIOGRAPHY;  Surgeon: Jettie Booze, MD;  Location: [place] [facility];  Service: Cardiovascular;  Laterality: N/A;   LEFT HEART CATHETERIZATION WITH CORONARY/GRAFT ANGIOGRAM N/A 12/21/2011   Procedure: LEFT HEART CATHETERIZATION WITH Beatrix Fetters;  Surgeon: Leonie Man, MD;  Location: [facility] CATH LAB;  Service: Cardiovascular;  Laterality: N/A;   NM MYOCAR PERF EJECTION FRACTION  09/22/2009, 07/03/2007   the post stress myocardial perfusion images show a normal pattern of perfusion is all regions. The post-stress ejection fraction is 68 %. no significant wall motion abnormalities noted. This is a low risk scan.   PERIPHERAL VASCULAR INTERVENTION  08/22/2017   Procedure: PERIPHERAL  VASCULAR INTERVENTION;  Surgeon: Serafina Mitchell, MD;  Location: [place] [facility];  Service: Cardiovascular;;  Fem/Fem Graft   REPLACEMENT TOTAL KNEE  05-2011   UNILATERAL UPPER EXTREMEITY ANGIOGRAM Left 11/15/2011   Procedure: UNILATERAL UPPER Anselmo Rod;  Surgeon: Lorretta Harp, MD;  Location: [facility] CATH LAB;  Service: Cardiovascular;  Laterality: Left;   Family History  Problem Relation Age of Onset   Heart attack Mother    Heart disease Mother        before age 30   Diabetes Father    Heart disease Father    Hypertension Father    Hyperlipidemia Father    Heart attack Father 21   Heart attack Brother 19   Cerebral palsy Sister 64   Congestive Heart Failure Sister 73   Heart attack Sister 70   Hypertension Sister 41   Dementia Sister    Anxiety disorder Sister    Anxiety disorder Sister 72   Heart Problems Sister    Stroke Sister    Heart Problems Sister 49   Heart attack Sister 5   Colon cancer Brother 36   Prostate cancer Brother 49   Hypertension Daughter    Irritable bowel syndrome Daughter    Depression Daughter    Anxiety disorder Daughter    Social History   Socioeconomic History   Marital status: Divorced    Spouse name: Not on file   Number of children: Not on file   Years of education: Not on file   Highest education level: Not on file  Occupational History   Not on file  Tobacco Use   Smoking status: Former    Years: 3.00    Types: Cigarettes    Quit date: 11/27/1981    Years since quitting: 40.4   Smokeless tobacco: Never  Vaping Use   Vaping Use: Never used  Substance and Sexual Activity   Alcohol use: Not Currently    Alcohol/week: 0.0 standard drinks of alcohol   Drug use: No   Sexual activity: Not Currently    Comment: 1st intercourse 48 yo-1 partner  Other Topics Concern   Not on file  Social History Narrative   Social History      Diet? Healthy but too many sweets      Do you drink/eat things with caffeine? Yes  occasionally      Marital status?  D                What year were you married?      Do you live in a house, apartment, assisted living, condo, trailer, etc.? condo      Is it one or more stories? 1      How many persons live in your home? 2      Do you have any pets in your home? (please list) 1 cat      Highest level of education completed? 1 year college      Current or past profession: housewife      Do you exercise?           no                           Type & how often?      Advanced Directives      Do you have a living will? no      Do you have a DNR form?             no                     If not, do you want to discuss one? yes      Do you have signed POA/HPOA for forms? no      Functional Status      Do you have difficulty bathing or dressing yourself? No- gets tired      Do you have difficulty preparing food or eating? No- eats frozen entrees or poor nutrition meals      Do you have difficulty managing your medications? yes      Do you have difficulty managing your finances? yes      Do you have difficulty affording your medications? No- funds running low   Social Determinants of Health   Financial Resource Strain: Not on file  Food Insecurity: No Food Insecurity (11/17/2021)   Hunger Vital Sign    Worried About Running Out of Food in the Last Year: Never true    Ran Out of Food in the Last Year: Never true  Transportation Needs: No Transportation Needs (05/12/2021)   PRAPARE - Hydrologist (Medical): No    Lack of Transportation (Non-Medical): No  Physical Activity: Not on file  Stress: Not on file  Social Connections: Not on file    Tobacco Counseling Counseling given: Not Answered   Clinical Intake:  Pre-visit preparation completed: Yes  Pain : No/denies pain     BMI - recorded: 35.11 Nutritional Status: BMI > 30  Obese Nutritional Risks: None Diabetes: No CBG done?: No Did pt. bring in  CBG monitor from home?: No  How often do you need to have someone help you when you read instructions, pamphlets, or other written materials from your doctor or pharmacy?: 3 - Sometimes What is the last grade level you completed in school?: [place]  Diabetic?no  Interpreter Needed?: No  Information entered by :: Porsha McClurkin,CMA   Activities of Daily Living    11/17/2021   10:12 AM  In your present state of health, do you have any difficulty performing the following activities:  Hearing? 1  Comment hearing loss  Vision? 1  Comment glaucoma  Difficulty concentrating or making decisions? 0  Walking or climbing stairs? 1  Comment assistance of 1 person  Dressing or bathing? 0  Comment  daughter does  Doing errands, shopping? 0  Comment daughter does  Conservation officer, nature and eating ? Y  Comment daughter does  Using the Toilet? Y  Comment daughter does  In the past six months, have you accidently leaked urine? Y  Comment daughter does  Do you have problems with loss of bowel control? N  Managing your Medications? Y  Comment daughter does  Managing your Finances? Y  Comment daughter does  [place], broadcasting/film/video? Y  Comment daughter does    Patient Care Team: Lauree Chandler, NP as PCP - General (Geriatric Medicine) Lorretta Harp, MD as PCP - Cardiology (Cardiology) Irene Shipper, MD as Consulting Physician (Gastroenterology) Frederik Pear, MD as Consulting Physician (Orthopedic Surgery) Fontaine, Belinda Block, MD (Inactive) as Consulting Physician (Gynecology) Rutherford Guys, MD as Consulting Physician (Ophthalmology) Bjorn Loser, MD as Consulting Physician (Urology) Serafina Mitchell, MD as Consulting Physician (Vascular Surgery) Cameron Sprang, MD as Consulting Physician (Neurology) Desma Maxim, MD as Referring Physician (Ophthalmology) Thayer Headings, PMHNP as Nurse Practitioner (Psychiatry) Cameron Sprang, MD as Consulting Physician  (Neurology)  Indicate any recent Medical Services you may have received from other than [facility] providers in the past year (date may be approximate).     Assessment:   This is a routine wellness examination for [place].  Hearing/Vision screen No results found.  Dietary issues and exercise activities discussed:     Goals Addressed   None    Depression Screen    11/17/2021   10:11 AM 10/11/2021    3:09 PM 05/12/2021    2:48 PM 04/15/2021    2:59 PM 09/02/2020    1:45 PM 05/06/2020    3:06 PM 04/08/2019    3:30 PM  PHQ 2/9 Scores  PHQ - 2 Score    0  0   Exception Documentation Other- indicate reason in comment box Other- indicate reason in comment box Other- indicate reason in comment box  Other- indicate reason in comment box  Other- indicate reason in comment box  Not completed daughter completes assessment Patient see's a psych Daughter finishes assessment  Seeing psych  Already under the care [place]    Fall Risk    11/25/2021    2:47 PM 11/17/2021   10:11 AM 11/16/2021    1:57 PM 11/10/2021   11:33 AM 11/02/2021    1:53 PM  [place] in the past year? 0 0 0 0 0  Number falls in past yr: 0  0 0 0  Injury with Fall? 0  0 0 0  Risk for fall due to :   No Fall Risks No Fall Risks No Fall Risks  Follow up   Falls evaluation completed Falls evaluation completed Falls evaluation completed    [place]:  Any stairs in or around the home? Yes  If so, are there any without handrails? No  Home free of loose throw rugs in walkways, pet beds, electrical cords, etc? Yes  Adequate lighting in your home to reduce risk of falls? Yes   ASSISTIVE DEVICES UTILIZED TO PREVENT FALLS:  Life alert? No  Use of a cane, walker or w/c? Yes  Grab bars in the bathroom? Yes  Shower chair or bench in shower? Yes  Elevated toilet seat or a handicapped toilet? Yes   TIMED UP AND GO:  Was the test performed? No .    Cognitive Function:    06/15/2021  8:00 PM 03/07/2018   11:00 AM 05/30/2014   11:22 AM  MMSE - Mini Mental State Exam  Orientation to time '3 5 5  '$ Orientation to Place '4 5 5  '$ Registration '3 3 3  '$ Attention/ Calculation '4 5 5  '$ Recall '3 2 2  '$ Language- name 2 objects '2 2 2  '$ Language- repeat '1 1 1  '$ Language- follow 3 step command '3 3 3  '$ Language- read & follow direction '1 1 1  '$ Write a sentence '1 1 1  '$ Copy design '1 1 1  '$ Total score '26 29 29      '$ 11/20/2018   12:00 PM  [place]real Cognitive Assessment   Visuospatial/ Executive (0/5) 0  Naming (0/3) 0  Attention: Read list of digits (0/2) 2  Attention: Read list of letters (0/1) 1  Attention: Serial 7 subtraction starting at 100 (0/3) 2  Language: Repeat phrase (0/2) 1  Language : Fluency (0/1) 0  Abstraction (0/2) 0  Delayed Recall (0/5) 2  Orientation (0/6) 5  Total 13      04/15/2021    3:02 PM  6CIT Screen  What Year? 0 points  What month? 0 points  What time? 0 points  Count back from 20 0 points  Months in reverse 4 points  Repeat phrase 6 points  Total Score 10 points    Immunizations Immunization History  Administered Date(s) Administered   Fluad Quad(high Dose 65+) 04/08/2019, 05/06/2020, 04/07/2021   Influenza, High Dose Seasonal PF 06/22/2016, 07/13/2017, 05/31/2018   Influenza,inj,Quad PF,6+ Mos 06/11/2013   PFIZER(Purple Top)SARS-COV-2 Vaccination 09/22/2019, 10/15/2019, 08/18/2020   Pneumococcal Conjugate-13 11/25/2016   Pneumococcal Polysaccharide-23 07/08/2015   Zoster Recombinat (Shingrix) 06/22/2016    TDAP status: Due, Education has been provided regarding the importance of this vaccine. Advised may receive this vaccine at local pharmacy or Health Dept. Aware to provide a copy of the vaccination record if obtained from local pharmacy or Health Dept. Verbalized acceptance and understanding.  Flu Vaccine status: Due, Education has been provided regarding the importance of this vaccine. Advised may receive this vaccine at local pharmacy or  Health Dept. Aware to provide a copy of the vaccination record if obtained from local pharmacy or Health Dept. Verbalized acceptance and understanding.  Pneumococcal vaccine status: Up to date  Covid-19 vaccine status: Information provided on how to obtain vaccines.   Qualifies for Shingles Vaccine? Yes   Zostavax completed No   Shingrix Completed?: Yes  Screening Tests Health Maintenance  Topic Date Due   TETANUS/TDAP  Never done   Zoster Vaccines- Shingrix (2 of 2) 08/17/2016   COVID-19 Vaccine (4 - Pfizer risk series) 10/13/2020   INFLUENZA VACCINE  03/08/2022   Pneumonia Vaccine 72+ Years old  Completed   DEXA SCAN  Completed   HPV VACCINES  Aged Out   COLONOSCOPY (Pts 45-67yr Insurance coverage will need to be confirmed)  Discontinued    Health Maintenance  Health Maintenance Due  Topic Date Due   TETANUS/TDAP  Never done   Zoster Vaccines- Shingrix (2 of 2) 08/17/2016   COVID-19 Vaccine (4 - Pfizer risk series) 10/13/2020   INFLUENZA VACCINE  03/08/2022    Colorectal cancer screening: No longer required.   Mammogram status: No longer required due to aged out.  Bone Density status: Ordered today. Pt provided with contact info and advised to call to schedule appt.  Lung Cancer Screening: (Low Dose CT Chest recommended if Age 80-80years, 30 pack-year currently smoking OR have quit w/in 15years.)  does not qualify.   Lung Cancer Screening Referral: na  Additional Screening:  Hepatitis C Screening: does not qualify; Completed na  Vision Screening: Recommended annual ophthalmology exams for early detection of glaucoma and other disorders of the eye. Is the patient up to date with their annual eye exam?  Yes  Who is the provider or what is the name of the office in which the patient attends annual eye exams? [facility] If pt is not established with a provider, would they like to be referred to a provider to establish care? No .   Dental Screening: Recommended annual  dental exams for proper oral hygiene  Community Resource Referral / Chronic Care Management: CRR required this visit?  No   CCM required this visit?  No      Plan:     I have personally reviewed and noted the following in the patient's chart:   Medical and social history Use of alcohol, tobacco or illicit drugs  Current medications and supplements including opioid prescriptions. Patient is not currently taking opioid prescriptions. Functional ability and status Nutritional status Physical activity Advanced directives List of other physicians Hospitalizations, surgeries, and ER visits in previous 12 months Vitals Screenings to include cognitive, depression, and falls Referrals and appointments  In addition, I have reviewed and discussed with patient certain preventive protocols, quality metrics, and best practice recommendations. A written personalized care plan for preventive services as well as general preventive health recommendations were provided to patient.     Lauree Chandler, NP   05/16/2022    Virtual Visit via Telephone Note  I connected with patient 05/16/22 at  3:20 PM EDT by telephone and verified that I am speaking with the correct person using two identifiers.  Location: Patient: home Provider: [facility]   I discussed the limitations, risks, security and privacy concerns of performing an evaluation and management service by telephone and the availability of in person appointments. I also discussed with the patient that there may be a patient responsible charge related to this service. The patient expressed understanding and agreed to proceed.   I discussed the assessment and treatment plan with the patient. The patient was provided an opportunity to ask questions and all were answered. The patient agreed with the plan and demonstrated an understanding of the instructions.   The patient was advised to call back or seek an in-person evaluation if the  symptoms worsen or if the condition fails to improve as anticipated.  I provided 15 minutes of non-face-to-face time during this encounter.  [name]. Harle Battiest Avs printed and mailed

## 2022-06-03 ENCOUNTER — Other Ambulatory Visit: Payer: Self-pay | Admitting: Nurse Practitioner

## 2022-06-03 DIAGNOSIS — I509 Heart failure, unspecified: Secondary | ICD-10-CM

## 2022-06-08 ENCOUNTER — Ambulatory Visit: Payer: Medicare HMO | Admitting: Cardiovascular Disease

## 2022-06-15 ENCOUNTER — Ambulatory Visit: Payer: Medicare HMO | Admitting: Physician Assistant

## 2022-06-15 ENCOUNTER — Encounter: Payer: Self-pay | Admitting: Physician Assistant

## 2022-06-15 DIAGNOSIS — Z029 Encounter for administrative examinations, unspecified: Secondary | ICD-10-CM

## 2022-06-17 ENCOUNTER — Encounter (HOSPITAL_COMMUNITY): Payer: Self-pay | Admitting: Internal Medicine

## 2022-06-17 ENCOUNTER — Emergency Department (HOSPITAL_COMMUNITY): Payer: Medicare HMO

## 2022-06-17 ENCOUNTER — Other Ambulatory Visit: Payer: Self-pay

## 2022-06-17 ENCOUNTER — Inpatient Hospital Stay (HOSPITAL_COMMUNITY)
Admission: EM | Admit: 2022-06-17 | Discharge: 2022-06-19 | DRG: 177 | Disposition: A | Discharge status: Home / Self Care | Payer: Medicare HMO | Attending: Family Medicine | Admitting: Family Medicine

## 2022-06-17 DIAGNOSIS — Z8249 Family history of ischemic heart disease and other diseases of the circulatory system: Secondary | ICD-10-CM

## 2022-06-17 DIAGNOSIS — I252 Old myocardial infarction: Secondary | ICD-10-CM | POA: Diagnosis not present

## 2022-06-17 DIAGNOSIS — N1832 Chronic kidney disease, stage 3b: Secondary | ICD-10-CM | POA: Diagnosis not present

## 2022-06-17 DIAGNOSIS — J9601 Acute respiratory failure with hypoxia: Secondary | ICD-10-CM | POA: Diagnosis present

## 2022-06-17 DIAGNOSIS — I2510 Atherosclerotic heart disease of native coronary artery without angina pectoris: Secondary | ICD-10-CM | POA: Diagnosis present

## 2022-06-17 DIAGNOSIS — Z951 Presence of aortocoronary bypass graft: Secondary | ICD-10-CM | POA: Diagnosis not present

## 2022-06-17 DIAGNOSIS — F32A Depression, unspecified: Secondary | ICD-10-CM | POA: Diagnosis present

## 2022-06-17 DIAGNOSIS — F411 Generalized anxiety disorder: Secondary | ICD-10-CM | POA: Diagnosis present

## 2022-06-17 DIAGNOSIS — K219 Gastro-esophageal reflux disease without esophagitis: Secondary | ICD-10-CM | POA: Diagnosis present

## 2022-06-17 DIAGNOSIS — U071 COVID-19: Principal | ICD-10-CM

## 2022-06-17 DIAGNOSIS — F0394 Unspecified dementia, unspecified severity, with anxiety: Secondary | ICD-10-CM | POA: Diagnosis present

## 2022-06-17 DIAGNOSIS — E782 Mixed hyperlipidemia: Secondary | ICD-10-CM | POA: Diagnosis present

## 2022-06-17 DIAGNOSIS — Z96652 Presence of left artificial knee joint: Secondary | ICD-10-CM | POA: Diagnosis present

## 2022-06-17 DIAGNOSIS — Z885 Allergy status to narcotic agent status: Secondary | ICD-10-CM

## 2022-06-17 DIAGNOSIS — Z79890 Hormone replacement therapy (postmenopausal): Secondary | ICD-10-CM

## 2022-06-17 DIAGNOSIS — R0689 Other abnormalities of breathing: Secondary | ICD-10-CM | POA: Diagnosis not present

## 2022-06-17 DIAGNOSIS — Z8619 Personal history of other infectious and parasitic diseases: Secondary | ICD-10-CM

## 2022-06-17 DIAGNOSIS — Z7984 Long term (current) use of oral hypoglycemic drugs: Secondary | ICD-10-CM

## 2022-06-17 DIAGNOSIS — I480 Paroxysmal atrial fibrillation: Secondary | ICD-10-CM | POA: Diagnosis present

## 2022-06-17 DIAGNOSIS — Z79899 Other long term (current) drug therapy: Secondary | ICD-10-CM | POA: Diagnosis not present

## 2022-06-17 DIAGNOSIS — H409 Unspecified glaucoma: Secondary | ICD-10-CM | POA: Diagnosis present

## 2022-06-17 DIAGNOSIS — I739 Peripheral vascular disease, unspecified: Secondary | ICD-10-CM | POA: Diagnosis present

## 2022-06-17 DIAGNOSIS — I5042 Chronic combined systolic (congestive) and diastolic (congestive) heart failure: Secondary | ICD-10-CM | POA: Diagnosis present

## 2022-06-17 DIAGNOSIS — R0602 Shortness of breath: Secondary | ICD-10-CM | POA: Diagnosis not present

## 2022-06-17 DIAGNOSIS — E876 Hypokalemia: Secondary | ICD-10-CM | POA: Diagnosis not present

## 2022-06-17 DIAGNOSIS — I130 Hypertensive heart and chronic kidney disease with heart failure and stage 1 through stage 4 chronic kidney disease, or unspecified chronic kidney disease: Secondary | ICD-10-CM | POA: Diagnosis present

## 2022-06-17 DIAGNOSIS — I10 Essential (primary) hypertension: Secondary | ICD-10-CM | POA: Diagnosis present

## 2022-06-17 DIAGNOSIS — E039 Hypothyroidism, unspecified: Secondary | ICD-10-CM | POA: Diagnosis present

## 2022-06-17 DIAGNOSIS — I5032 Chronic diastolic (congestive) heart failure: Secondary | ICD-10-CM | POA: Diagnosis not present

## 2022-06-17 DIAGNOSIS — Z7902 Long term (current) use of antithrombotics/antiplatelets: Secondary | ICD-10-CM

## 2022-06-17 DIAGNOSIS — Z818 Family history of other mental and behavioral disorders: Secondary | ICD-10-CM

## 2022-06-17 DIAGNOSIS — N184 Chronic kidney disease, stage 4 (severe): Secondary | ICD-10-CM | POA: Diagnosis present

## 2022-06-17 DIAGNOSIS — Z7982 Long term (current) use of aspirin: Secondary | ICD-10-CM

## 2022-06-17 DIAGNOSIS — R0902 Hypoxemia: Principal | ICD-10-CM

## 2022-06-17 DIAGNOSIS — Z86010 Personal history of colonic polyps: Secondary | ICD-10-CM

## 2022-06-17 DIAGNOSIS — D696 Thrombocytopenia, unspecified: Secondary | ICD-10-CM | POA: Diagnosis not present

## 2022-06-17 DIAGNOSIS — G4733 Obstructive sleep apnea (adult) (pediatric): Secondary | ICD-10-CM | POA: Diagnosis present

## 2022-06-17 DIAGNOSIS — Z8701 Personal history of pneumonia (recurrent): Secondary | ICD-10-CM

## 2022-06-17 DIAGNOSIS — F0393 Unspecified dementia, unspecified severity, with mood disturbance: Secondary | ICD-10-CM | POA: Diagnosis present

## 2022-06-17 DIAGNOSIS — I959 Hypotension, unspecified: Secondary | ICD-10-CM | POA: Diagnosis not present

## 2022-06-17 DIAGNOSIS — Z87820 Personal history of traumatic brain injury: Secondary | ICD-10-CM

## 2022-06-17 DIAGNOSIS — I708 Atherosclerosis of other arteries: Secondary | ICD-10-CM | POA: Diagnosis present

## 2022-06-17 DIAGNOSIS — E872 Acidosis, unspecified: Secondary | ICD-10-CM | POA: Diagnosis not present

## 2022-06-17 DIAGNOSIS — I700 Atherosclerosis of aorta: Secondary | ICD-10-CM | POA: Diagnosis not present

## 2022-06-17 DIAGNOSIS — M47816 Spondylosis without myelopathy or radiculopathy, lumbar region: Secondary | ICD-10-CM | POA: Diagnosis present

## 2022-06-17 DIAGNOSIS — Z87891 Personal history of nicotine dependence: Secondary | ICD-10-CM

## 2022-06-17 DIAGNOSIS — H9190 Unspecified hearing loss, unspecified ear: Secondary | ICD-10-CM | POA: Diagnosis present

## 2022-06-17 DIAGNOSIS — Z886 Allergy status to analgesic agent status: Secondary | ICD-10-CM

## 2022-06-17 DIAGNOSIS — G8929 Other chronic pain: Secondary | ICD-10-CM | POA: Diagnosis present

## 2022-06-17 DIAGNOSIS — Z83438 Family history of other disorder of lipoprotein metabolism and other lipidemia: Secondary | ICD-10-CM

## 2022-06-17 DIAGNOSIS — I499 Cardiac arrhythmia, unspecified: Secondary | ICD-10-CM | POA: Diagnosis not present

## 2022-06-17 DIAGNOSIS — Z888 Allergy status to other drugs, medicaments and biological substances status: Secondary | ICD-10-CM

## 2022-06-17 LAB — URINALYSIS, ROUTINE W REFLEX MICROSCOPIC
Bilirubin Urine: NEGATIVE
Glucose, UA: 150 mg/dL — AB
Hgb urine dipstick: NEGATIVE
Ketones, ur: NEGATIVE mg/dL
Leukocytes,Ua: NEGATIVE
Nitrite: NEGATIVE
Protein, ur: NEGATIVE mg/dL
Specific Gravity, Urine: 1.006 (ref 1.005–1.030)
pH: 6 (ref 5.0–8.0)

## 2022-06-17 LAB — COMPREHENSIVE METABOLIC PANEL
ALT: 20 U/L (ref 0–44)
AST: 21 U/L (ref 15–41)
Albumin: 3.2 g/dL — ABNORMAL LOW (ref 3.5–5.0)
Alkaline Phosphatase: 71 U/L (ref 38–126)
Anion gap: 11 (ref 5–15)
BUN: 11 mg/dL (ref 8–23)
CO2: 26 mmol/L (ref 22–32)
Calcium: 8.8 mg/dL — ABNORMAL LOW (ref 8.9–10.3)
Chloride: 102 mmol/L (ref 98–111)
Creatinine, Ser: 1.69 mg/dL — ABNORMAL HIGH (ref 0.44–1.00)
GFR, Estimated: 30 mL/min — ABNORMAL LOW (ref >60)
Glucose, Bld: 138 mg/dL — ABNORMAL HIGH (ref 70–99)
Potassium: 3 mmol/L — ABNORMAL LOW (ref 3.5–5.1)
Sodium: 139 mmol/L (ref 135–145)
Total Bilirubin: 1.2 mg/dL (ref 0.3–1.2)
Total Protein: 5.6 g/dL — ABNORMAL LOW (ref 6.5–8.1)

## 2022-06-17 LAB — LACTIC ACID, PLASMA: Lactic Acid, Venous: 2.1 mmol/L (ref 0.5–1.9)

## 2022-06-17 LAB — CBC WITH DIFFERENTIAL/PLATELET
Abs Immature Granulocytes: 0.04 10*3/uL (ref 0.00–0.07)
Basophils Absolute: 0 10*3/uL (ref 0.0–0.1)
Basophils Relative: 0 %
Eosinophils Absolute: 0.1 10*3/uL (ref 0.0–0.5)
Eosinophils Relative: 1 %
HCT: 39 % (ref 36.0–46.0)
Hemoglobin: 13 g/dL (ref 12.0–15.0)
Immature Granulocytes: 1 %
Lymphocytes Relative: 10 %
Lymphs Abs: 0.7 10*3/uL (ref 0.7–4.0)
MCH: 29.6 pg (ref 26.0–34.0)
MCHC: 33.3 g/dL (ref 30.0–36.0)
MCV: 88.8 fL (ref 80.0–100.0)
Monocytes Absolute: 0.5 10*3/uL (ref 0.1–1.0)
Monocytes Relative: 7 %
Neutro Abs: 6.1 10*3/uL (ref 1.7–7.7)
Neutrophils Relative %: 81 %
Platelets: 155 10*3/uL (ref 150–400)
RBC: 4.39 MIL/uL (ref 3.87–5.11)
RDW: 15.6 % — ABNORMAL HIGH (ref 11.5–15.5)
WBC: 7.5 10*3/uL (ref 4.0–10.5)
nRBC: 0 % (ref 0.0–0.2)

## 2022-06-17 LAB — RESP PANEL BY RT-PCR (FLU A&B, COVID) ARPGX2
Influenza A by PCR: NEGATIVE
Influenza B by PCR: NEGATIVE
SARS Coronavirus 2 by RT PCR: POSITIVE — AB

## 2022-06-17 LAB — TROPONIN I (HIGH SENSITIVITY): Troponin I (High Sensitivity): 43 ng/L — ABNORMAL HIGH (ref <18)

## 2022-06-17 MED ORDER — ACETAMINOPHEN 325 MG PO TABS: 650.0000 mg | ORAL_TABLET | Freq: Four times a day (QID) | ORAL | Status: DC | PRN

## 2022-06-17 MED ORDER — METOPROLOL TARTRATE 12.5 MG HALF TABLET
12.5000 mg | ORAL_TABLET | Freq: Two times a day (BID) | ORAL | Status: DC
  Administered 2022-06-17 – 2022-06-19 (×5): 12.5 mg via ORAL
  Filled 2022-06-17 (×5): qty 1

## 2022-06-17 MED ORDER — POTASSIUM CHLORIDE CRYS ER 20 MEQ PO TBCR
40.0000 meq | EXTENDED_RELEASE_TABLET | Freq: Every day | ORAL | Status: DC
  Administered 2022-06-18 – 2022-06-19 (×2): 40 meq via ORAL
  Filled 2022-06-17 (×2): qty 2

## 2022-06-17 MED ORDER — MOLNUPIRAVIR EUA 200MG CAPSULE
4.0000 | ORAL_CAPSULE | Freq: Two times a day (BID) | ORAL | Status: DC
  Administered 2022-06-17 – 2022-06-19 (×5): 800 mg via ORAL
  Filled 2022-06-17: qty 1
  Filled 2022-06-17 (×2): qty 4

## 2022-06-17 MED ORDER — ONDANSETRON HCL 4 MG PO TABS
4.0000 mg | ORAL_TABLET | Freq: Four times a day (QID) | ORAL | Status: DC | PRN
  Administered 2022-06-18: 4 mg via ORAL
  Filled 2022-06-17: qty 1

## 2022-06-17 MED ORDER — ALPRAZOLAM 0.5 MG PO TABS
0.5000 mg | ORAL_TABLET | Freq: Every day | ORAL | Status: DC | PRN
  Administered 2022-06-19: 0.5 mg via ORAL
  Filled 2022-06-17: qty 1

## 2022-06-17 MED ORDER — ESCITALOPRAM OXALATE 10 MG PO TABS
20.0000 mg | ORAL_TABLET | Freq: Every day | ORAL | Status: DC
  Administered 2022-06-17 – 2022-06-19 (×3): 20 mg via ORAL
  Filled 2022-06-17 (×3): qty 2

## 2022-06-17 MED ORDER — BUSPIRONE HCL 5 MG PO TABS
7.5000 mg | ORAL_TABLET | Freq: Every day | ORAL | Status: DC
  Administered 2022-06-18 – 2022-06-19 (×2): 7.5 mg via ORAL
  Filled 2022-06-17: qty 2
  Filled 2022-06-17: qty 1
  Filled 2022-06-17: qty 2

## 2022-06-17 MED ORDER — DEXAMETHASONE 6 MG PO TABS
6.0000 mg | ORAL_TABLET | Freq: Every day | ORAL | Status: DC
  Administered 2022-06-17 – 2022-06-19 (×3): 6 mg via ORAL
  Filled 2022-06-17: qty 2
  Filled 2022-06-17 (×2): qty 1

## 2022-06-17 MED ORDER — ALBUTEROL SULFATE (2.5 MG/3ML) 0.083% IN NEBU: 2.5000 mg | INHALATION_SOLUTION | RESPIRATORY_TRACT | Status: DC | PRN

## 2022-06-17 MED ORDER — DIVALPROEX SODIUM ER 250 MG PO TB24
250.0000 mg | ORAL_TABLET | Freq: Every day | ORAL | Status: DC
  Administered 2022-06-17 – 2022-06-18 (×2): 250 mg via ORAL
  Filled 2022-06-17 (×3): qty 1

## 2022-06-17 MED ORDER — DAPAGLIFLOZIN PROPANEDIOL 10 MG PO TABS
10.0000 mg | ORAL_TABLET | Freq: Every day | ORAL | Status: DC
  Administered 2022-06-17 – 2022-06-19 (×3): 10 mg via ORAL
  Filled 2022-06-17 (×3): qty 1

## 2022-06-17 MED ORDER — LEVOTHYROXINE SODIUM 75 MCG PO TABS
175.0000 ug | ORAL_TABLET | Freq: Every day | ORAL | Status: DC
  Administered 2022-06-18 – 2022-06-19 (×2): 175 ug via ORAL
  Filled 2022-06-17 (×2): qty 1

## 2022-06-17 MED ORDER — PANTOPRAZOLE SODIUM 40 MG PO TBEC
40.0000 mg | DELAYED_RELEASE_TABLET | Freq: Every day | ORAL | Status: DC
  Administered 2022-06-17 – 2022-06-19 (×3): 40 mg via ORAL
  Filled 2022-06-17 (×3): qty 1

## 2022-06-17 MED ORDER — ONDANSETRON HCL 4 MG/2ML IJ SOLN: 4.0000 mg | Freq: Four times a day (QID) | INTRAMUSCULAR | Status: DC | PRN

## 2022-06-17 MED ORDER — BUSPIRONE HCL 5 MG PO TABS
15.0000 mg | ORAL_TABLET | Freq: Every day | ORAL | Status: DC
  Administered 2022-06-18: 15 mg via ORAL
  Filled 2022-06-17: qty 1

## 2022-06-17 MED ORDER — HEPARIN SODIUM (PORCINE) 5000 UNIT/ML IJ SOLN
5000.0000 [IU] | Freq: Three times a day (TID) | INTRAMUSCULAR | Status: DC
  Administered 2022-06-17 – 2022-06-19 (×6): 5000 [IU] via SUBCUTANEOUS
  Filled 2022-06-17 (×6): qty 1

## 2022-06-17 MED ORDER — POTASSIUM CHLORIDE CRYS ER 20 MEQ PO TBCR
40.0000 meq | EXTENDED_RELEASE_TABLET | ORAL | Status: AC
  Administered 2022-06-17: 40 meq via ORAL
  Filled 2022-06-17: qty 2

## 2022-06-17 MED ORDER — SODIUM CHLORIDE 0.9 % IV SOLN
2.0000 g | INTRAVENOUS | Status: DC
  Administered 2022-06-17: 2 g via INTRAVENOUS
  Filled 2022-06-17: qty 20

## 2022-06-17 MED ORDER — ROSUVASTATIN CALCIUM 20 MG PO TABS
40.0000 mg | ORAL_TABLET | Freq: Every day | ORAL | Status: DC
  Administered 2022-06-17 – 2022-06-19 (×3): 40 mg via ORAL
  Filled 2022-06-17 (×3): qty 2

## 2022-06-17 MED ORDER — POTASSIUM CHLORIDE CRYS ER 20 MEQ PO TBCR: 40.0000 meq | EXTENDED_RELEASE_TABLET | Freq: Every day | ORAL | Status: DC

## 2022-06-17 MED ORDER — FUROSEMIDE 20 MG PO TABS: 40.0000 mg | ORAL_TABLET | Freq: Every day | ORAL | Status: DC

## 2022-06-17 MED ORDER — GABAPENTIN 100 MG PO CAPS
200.0000 mg | ORAL_CAPSULE | Freq: Every day | ORAL | Status: DC
  Administered 2022-06-17 – 2022-06-19 (×3): 200 mg via ORAL
  Filled 2022-06-17 (×3): qty 2

## 2022-06-17 MED ORDER — SODIUM CHLORIDE 0.9 % IV SOLN
500.0000 mg | INTRAVENOUS | Status: DC
  Administered 2022-06-17: 500 mg via INTRAVENOUS
  Filled 2022-06-17: qty 5

## 2022-06-17 MED ORDER — ASPIRIN 81 MG PO TBEC
81.0000 mg | DELAYED_RELEASE_TABLET | Freq: Every day | ORAL | Status: DC
  Administered 2022-06-17 – 2022-06-18 (×2): 81 mg via ORAL
  Filled 2022-06-17 (×2): qty 1

## 2022-06-17 MED ORDER — LACTATED RINGERS IV SOLN: INTRAVENOUS | Status: DC

## 2022-06-17 MED ORDER — BUSPIRONE HCL 10 MG PO TABS: 15.0000 mg | ORAL_TABLET | Freq: Two times a day (BID) | ORAL | Status: DC

## 2022-06-17 MED ORDER — ACETAMINOPHEN 650 MG RE SUPP: 650.0000 mg | Freq: Four times a day (QID) | RECTAL | Status: DC | PRN

## 2022-06-17 NOTE — Sepsis Progress Note (Signed)
Elink monitoring for the code sepsis protocol.  

## 2022-06-17 NOTE — Assessment & Plan Note (Signed)
Stable. Continue lopressor 12.5 mg bid.

## 2022-06-17 NOTE — Assessment & Plan Note (Signed)
Stable. At baseline Scr.

## 2022-06-17 NOTE — Assessment & Plan Note (Signed)
Likely due to daily Lasix therapy.  Replete with p.o. potassium.

## 2022-06-17 NOTE — H&P (Signed)
History and Physical    Deanna [name] FHL:[id number] DOB: April 16, 1941 DOA: 06/17/2022  DOS: the patient was seen and examined on 06/17/2022  PCP: Lauree Chandler, NP   Patient coming from: Home  I have personally briefly reviewed patient's old medical records in [place]  CC: hypoxia HPI: 81 year old female history of chronic diastolic heart failure, hypertension, anxiety disorder presents to the ER today due to.  EMS was called out to her house due to shortness of breath.  Patient's daughter reportedly having flulike symptoms.  When EMS arrived, room air saturations were 84%.  Placed on 4 L oxygen.  O2 saturations increased to 94%.  Per admission report, patient has a history of confusion/dementia.  On arrival temp 99.2 but increased to 101.9.  Heart rate 96, blood pressure 152/69 satting 95% on 4 L.  Labs showed positive COVID negative flu  Lactic acid mildly elevated 2.1  Sodium 139, potassium 3.0, BUN of 11, creatinine 1.69  White count 7.5, hemoglobin 13, platelets 155  Chest x-ray negative for infiltrates.  Due to continued hypoxia, [facility] contacted for admission.   ED Course: Febrile one 1.9.  Chest x-ray negative.  COVID-positive.  Review of Systems:  Review of Systems  Constitutional:  Positive for malaise/fatigue.  HENT: Negative.    Eyes: Negative.   Respiratory:  Positive for shortness of breath.   Cardiovascular: Negative.   Gastrointestinal: Negative.   Genitourinary: Negative.   Musculoskeletal: Negative.   Skin: Negative.   Neurological: Negative.   Endo/Heme/Allergies: Negative.   Psychiatric/Behavioral:  Positive for memory loss.   All other systems reviewed and are negative.   Past Medical History:  Diagnosis Date   Anxiety    Bacteremia, escherichia coli 04/27/2015   Brachial-basilar insufficiency syndrome    CAD (coronary artery disease)    Celiac artery stenosis (HCC)    Chronic bilateral low back pain without  sciatica    Chronic combined systolic (congestive) and diastolic (congestive) heart failure ([place]) 11/14/2017   CKD (chronic kidney disease), stage IV (HCC)    Cognitive impairment    Colon polyps    Community acquired pneumonia    Depression    Diverticulosis    Dysuria    Encephalomalacia    GAD (generalized anxiety disorder)    GERD (gastroesophageal reflux disease)    Glaucoma    Hearing loss    Hemorrhoids    Hypertension    Hypothyroidism    Incontinence    Mixed hyperlipidemia    Myocardial infarction (HCC)    NSTEMI (non-ST elevated myocardial infarction) ([place]) 12/19/2011   OSA on CPAP    PAF (paroxysmal atrial fibrillation) (HCC)    Peripheral arterial disease (HCC)    Pneumonitis 04/26/2015   Pyelonephritis due to Escherichia coli 04/28/2015   Sepsis (HCC)    Spondylosis of lumbar region without myelopathy or radiculopathy    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[place])    Urticaria    Vertigo, benign positional     Past Surgical History:  Procedure Laterality Date   ABDOMINAL AORTOGRAM W/LOWER EXTREMITY N/A 08/22/2017   Procedure: ABDOMINAL AORTOGRAM W/LOWER EXTREMITY;  Surgeon: Serafina Mitchell, MD;  Location: [place] [facility];  Service: Cardiovascular;  Laterality: N/A;   BILATERAL UPPER EXTREMITY ANGIOGRAM N/A 09/11/2012   Procedure: BILATERAL UPPER EXTREMITY ANGIOGRAM;  Surgeon: Serafina Mitchell, MD;  Location: [facility] CATH LAB;  Service: Cardiovascular;  Laterality: N/A;   BIOPSY  03/25/2020   Procedure: BIOPSY;  Surgeon: Irving Copas., MD;  Location: [facility];  Service: Gastroenterology;;   carotid duplex doppler Bilateral 09/03/2012, 11/03/2011   Evidence of 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 patent left common carotid- subclavian artery bypass with turbulent flow noted at the anastomosis with velocities of 295 cm/s    CAROTID-SUBCLAVIAN BYPASS GRAFT  12/15/2011   Procedure: BYPASS GRAFT CAROTID-SUBCLAVIAN;  Surgeon: Serafina Mitchell, MD;  Location: [facility] OR;  Service: Vascular;  Laterality: Left;  Left Carotid subclavian bypass   CORONARY ANGIOPLASTY     CORONARY ARTERY BYPASS GRAFT     CORONARY ATHERECTOMY N/A 03/19/2020   Procedure: CORONARY ATHERECTOMY;  Surgeon: Jettie Booze, MD;  Location: [place] [facility];  Service: Cardiovascular;  Laterality: N/A;   CORONARY STENT INTERVENTION N/A 03/19/2020   Procedure: CORONARY STENT INTERVENTION;  Surgeon: Jettie Booze, MD;  Location: [place] [facility];  Service: Cardiovascular;  Laterality: N/A;   DOPPLER ECHOCARDIOGRAPHY  05/27/2010, 09/17/2008   Mild Proximal septal thickening is noted. Left ventricular systolic functions is normal ejection fraction =>55%. the aortic valve appears to be mildly sclerotic    ESOPHAGOGASTRODUODENOSCOPY (EGD) WITH PROPOFOL N/A 03/25/2020   Procedure: ESOPHAGOGASTRODUODENOSCOPY (EGD) WITH PROPOFOL;  Surgeon: Rush Landmark Telford Nab., MD;  Location: [place];  Service: Gastroenterology;  Laterality: N/A;   fem-fem bypass graft  1999   holter monitor  01/21/2008   The predominant rhythm was normal sinus rhythm. Minimum heartrate of 63 bpm at +01:00, maximum heartrate of 105 bpm at + 10:35; and the average heartrate of 75 bpm. Ventricular ectopic activity totaled 1270: Multifocal; 866-PVC's and 404-VEs              JOINT REPLACEMENT     Left knee   LEFT HEART CATH AND CORS/GRAFTS ANGIOGRAPHY N/A 03/19/2020   Procedure: LEFT HEART CATH AND CORS/GRAFTS ANGIOGRAPHY;  Surgeon: Jettie Booze, MD;  Location: [place] [facility];  Service: Cardiovascular;  Laterality: N/A;   LEFT HEART CATHETERIZATION WITH CORONARY/GRAFT ANGIOGRAM N/A 12/21/2011   Procedure: LEFT HEART CATHETERIZATION WITH Beatrix Fetters;  Surgeon: Leonie Man, MD;  Location: [facility] C[facility] LAB;  Service: Cardiovascular;  Laterality: N/A;   NM MYOCAR  PERF EJECTION FRACTION  09/22/2009, 07/03/2007   the post stress myocardial perfusion images show a normal pattern of perfusion is all regions. The post-stress ejection fraction is 68 %. no significant wall motion abnormalities noted. This is a low risk scan.   PERIPHERAL VASCULAR INTERVENTION  08/22/2017   Procedure: PERIPHERAL VASCULAR INTERVENTION;  Surgeon: Serafina Mitchell, MD;  Location: [place] [facility];  Service: Cardiovascular;;  Fem/Fem Graft   REPLACEMENT TOTAL KNEE  05-2011   UNILATERAL UPPER EXTREMEITY ANGIOGRAM Left 11/15/2011   Procedure: UNILATERAL UPPER Anselmo Rod;  Surgeon: Lorretta Harp, MD;  Location: [facility] CATH LAB;  Service: Cardiovascular;  Laterality: Left;     reports that she quit smoking about 40 years ago. Her smoking use included cigarettes. She has never used smokeless tobacco. She reports that she does not currently use alcohol. She reports that she does not use drugs.  Allergies  Allergen Reactions   Donepezil Other (See Comments)    Altered mood, aggression, and caused anger   Duloxetine Hcl     Increased confusion and memory concerns    Meloxicam     Pt suspects that this medicine causes fluid on her lungs.    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    Vicodin [Hydrocodone-Acetaminophen] Other (See Comments)    Delirium, confusion, and toxic dementia    Family History  Problem Relation Age of Onset   Heart attack Mother    Heart disease Mother        before age 16   Diabetes Father    Heart disease Father    Hypertension Father    Hyperlipidemia Father    Heart attack Father 69   Heart attack Brother 40   Cerebral palsy Sister 42   Congestive Heart Failure Sister 72   Heart attack Sister 48   Hypertension Sister 16   Dementia Sister    Anxiety disorder Sister    Anxiety disorder Sister 53   Heart Problems Sister     Stroke Sister    Heart Problems Sister 34   Heart attack Sister 60   Colon cancer Brother 33   Prostate cancer Brother 34   Hypertension Daughter    Irritable bowel syndrome Daughter    Depression Daughter    Anxiety disorder Daughter     Prior to Admission medications   Medication Sig Start Date End Date Taking? Authorizing Provider  acetaminophen (TYLENOL) 325 MG tablet Take 325 mg by mouth in the morning and at bedtime. 1 by mouth in the morning and 1 by mouth in the pm.    [provider]  ALPRAZolam Duanne Moron) 0.5 MG tablet Take 1 tablet (0.5 mg total) by mouth daily as needed for anxiety. 04/14/22   Thayer Headings, PMHNP  aspirin EC 81 MG tablet Take 81 mg by mouth at bedtime.     [provider]  busPIRone (BUSPAR) 15 MG tablet Take 1 tablet (15 mg total) by mouth 2 (two) times daily. 04/14/22 07/13/22  Thayer Headings, PMHNP  cholecalciferol (VITAMIN D) 400 units TABS tablet Take 400 Units by mouth daily.    [provider]  clopidogrel (PLAVIX) 75 MG tablet TAKE 1 TABLET(75 MG) BY MOUTH DAILY 05/04/22   Lauree Chandler, NP  dapagliflozin propanediol (FARXIGA) 10 MG TABS tablet TAKE 1 TABLET(10 MG) BY MOUTH DAILY 05/04/22   Lauree Chandler, NP  divalproex (DEPAKOTE ER) 250 MG 24 hr tablet Take 1 tablet (250 mg total) by mouth at bedtime. 04/14/22   Thayer Headings, PMHNP  dorzolamide (TRUSOPT) 2 % ophthalmic solution Place 1 drop into both eyes 2 (two) times daily.    [provider]  escitalopram (LEXAPRO) 20 MG tablet Take 1 tablet (20 mg total) by mouth daily. 04/14/22   Thayer Headings, PMHNP  furosemide (LASIX) 40 MG tablet TAKE 1 TABLET(40 MG) BY MOUTH DAILY 06/03/22   Lauree Chandler, NP  gabapentin (NEURONTIN) 100 MG capsule TAKE 2 CAPSULES(200 MG) BY MOUTH DAILY 05/04/22   Lauree Chandler, NP  latanoprost (XALATAN) 0.005 % ophthalmic solution Place 1 drop into both eyes at bedtime.    [provider]  levothyroxine (SYNTHROID) 175  MCG tablet TAKE 1 TABLET BY MOUTH DAILY. TAKE AT THE SAME TIME DAILY SEPARATE FROM OTHER MEDICATIONS 10/14/21   Lauree Chandler, NP  meclizine (ANTIVERT) 25 MG tablet Take 1 tablet (25 mg total) by mouth as needed for dizziness. 11/30/18   Lauree Chandler, NP  metoprolol tartrate (LOPRESSOR) 25 MG tablet Take 0.5 tablets (12.5 mg total) by mouth 2 (two) times daily. 02/15/22   Lauree Chandler, NP  nitroGLYCERIN (NITROSTAT)  0.4 MG SL tablet Place 1 tablet (0.4 mg total) under the tongue every 5 (five) minutes x 3 doses as needed for chest pain. 05/20/21   Ngetich, Dinah C, NP  pantoprazole (PROTONIX) 40 MG tablet TAKE 1 TABLET EVERY DAY 09/20/21   Lauree Chandler, NP  rosuvastatin (CRESTOR) 40 MG tablet TAKE 1 TABLET(40 MG) BY MOUTH DAILY 02/15/22   Lauree Chandler, NP  vitamin B-12 (CYANOCOBALAMIN) 1000 MCG tablet Take 1,000 mcg by mouth daily.    [provider]    Physical Exam: Vitals:   06/17/22 0231 06/17/22 0318 06/17/22 0330 06/17/22 0400  BP:   (!) 145/82 133/66  Pulse:   (!) 102 99  Resp:   (!) 26 (!) 24  Temp:  (!) 101.9 F (38.8 C)    TempSrc:  Rectal    SpO2:   94% 95%  Weight: 84 kg     Height: '5\' 1"'$  (1.549 m)       Physical Exam Vitals and nursing note reviewed.  Constitutional:      General: She is not in acute distress.    Appearance: Normal appearance. She is obese. She is not ill-appearing, toxic-appearing or diaphoretic.  HENT:     Head: Normocephalic and atraumatic.     Nose: Nose normal.  Cardiovascular:     Rate and Rhythm: Normal rate and regular rhythm.  Pulmonary:     Effort: Pulmonary effort is normal.     Breath sounds: No wheezing or rales.  Abdominal:     General: Bowel sounds are normal. There is no distension.     Palpations: Abdomen is soft.     Tenderness: There is no abdominal tenderness. There is no guarding.  Musculoskeletal:     Right lower leg: No edema.     Left lower leg: No edema.  Skin:    General: Skin is warm  and dry.     Capillary Refill: Capillary refill takes less than 2 seconds.  Neurological:     General: No focal deficit present.     Mental Status: She is alert. She is disoriented.      Labs on Admission: I have personally reviewed following labs and imaging studies  CBC: Recent Labs  Lab 06/17/22 0256  WBC 7.5  NEUTROABS 6.1  HGB 13.0  HCT 39.0  MCV 88.8  PLT 829   Basic Metabolic Panel: Recent Labs  Lab 06/17/22 0256  NA 139  K 3.0*  CL 102  CO2 26  GLUCOSE 138*  BUN 11  CREATININE 1.69*  CALCIUM 8.8*   GFR: Estimated Creatinine Clearance: 26.1 mL/min (A) (by C-G formula based on SCr of 1.69 mg/dL (H)). Liver Function Tests: Recent Labs  Lab 06/17/22 0256  AST 21  ALT 20  ALKPHOS 71  BILITOT 1.2  PROT 5.6*  ALBUMIN 3.2*   No results for input(s): "LIPASE", "AMYLASE" in the last 168 hours. No results for input(s): "AMMONIA" in the last 168 hours. Coagulation Profile: No results for input(s): "INR", "PROTIME" in the last 168 hours. Cardiac Enzymes: No results for input(s): "CKTOTAL", "CKMB", "CKMBINDEX", "TROPONINI", "TROPONINIHS" in the last 168 hours. BNP (last 3 results) No results for input(s): "PROBNP" in the last 8760 hours. HbA1C: No results for input(s): "HGBA1C" in the last 72 hours. CBG: No results for input(s): "GLUCAP" in the last 168 hours. Lipid Profile: No results for input(s): "CHOL", "HDL", "LDLCALC", "TRIG", "CHOLHDL", "LDLDIRECT" in the last 72 hours. Thyroid Function Tests: No results for input(s): "TSH", "T4TOTAL", "  FREET4", "T3FREE", "THYROIDAB" in the last 72 hours. Anemia Panel: No results for input(s): "VITAMINB12", "FOLATE", "FERRITIN", "TIBC", "IRON", "RETICCTPCT" in the last 72 hours. Urine analysis:    Component Value Date/Time   COLORURINE YELLOW 06/17/2022 0352   APPEARANCEUR CLEAR 06/17/2022 0352   LABSPEC 1.006 06/17/2022 0352   PHURINE 6.0 06/17/2022 0352   GLUCOSEU 150 (A) 06/17/2022 0352   HGBUR NEGATIVE  06/17/2022 0352   BILIRUBINUR NEGATIVE 06/17/2022 0352   KETONESUR NEGATIVE 06/17/2022 0352   PROTEINUR NEGATIVE 06/17/2022 0352   UROBILINOGEN 0.2 04/26/2015 0811   NITRITE NEGATIVE 06/17/2022 0352   LEUKOCYTESUR NEGATIVE 06/17/2022 0352    Radiological Exams on Admission: I have personally reviewed images DG Chest Portable 1 View  Result Date: 06/17/2022 CLINICAL DATA:  Shortness of breath. EXAM: PORTABLE CHEST 1 VIEW COMPARISON:  October 29, 2021 FINDINGS: Multiple sternal wires and surgical clips are seen. The heart size and mediastinal contours are within normal limits. There is marked severity calcification of the aortic arch and descending thoracic aorta. There is no evidence of acute infiltrate, pleural effusion or pneumothorax. Multilevel degenerative changes seen throughout the thoracic spine. IMPRESSION: 1. Evidence of prior median sternotomy/CABG. 2. No acute cardiopulmonary disease. Electronically Signed   By: Virgina Norfolk M.D.   On: 06/17/2022 03:14    EKG: My personal interpretation of EKG shows: sinus tachycardia    Assessment/Plan Principal Problem:   Acute respiratory failure with hypoxia (HCC) Active Problems:   COVID-19 virus infection   Essential hypertension   Chronic diastolic CHF (congestive heart failure) (HCC)   Generalized anxiety disorder   Stage 3b chronic kidney disease (CKD) (HCC) - baseline SCr 1.8   Hypokalemia    Assessment and Plan: * Acute respiratory failure with hypoxia (HCC) Observation med/surg bed. Acute hypoxia due to covid. No evidence of pneumonia on CXR. No need for abx. Continue with supplemental O2.  COVID-19 virus infection Start decadron and molnupiravair. Continue supportive care.  Stage 3b chronic kidney disease (CKD) (HCC) - baseline SCr 1.8 Stable. At baseline Scr.  Generalized anxiety disorder Stable. On lexapro, xananx, buspar.  Chronic diastolic CHF (congestive heart failure) (HCC) Stable. Continue lasix 40 mg  daily, lopressor 12.5 mg bid  Essential hypertension Stable. Continue lopressor 12.5 mg bid.  Hypokalemia Likely due to daily Lasix therapy.  Replete with p.o. potassium.   DVT prophylaxis: SQ Heparin Code Status: Full Code Family Communication: no family at bedside  Disposition Plan: return home  Consults called: none  Admission status: Observation, Med-Surg   Kristopher Oppenheim, DO [facility] 06/17/2022, 5:37 AM

## 2022-06-17 NOTE — Assessment & Plan Note (Signed)
Stable. Continue lasix 40 mg daily, lopressor 12.5 mg bid

## 2022-06-17 NOTE — Assessment & Plan Note (Signed)
Start decadron and molnupiravair. Continue supportive care.

## 2022-06-17 NOTE — Sepsis Progress Note (Signed)
Notified bedside nurse of need to draw repeat lactic acid. 

## 2022-06-17 NOTE — Subjective & Objective (Signed)
CC: hypoxia HPI: 81 year old female history of chronic diastolic heart failure, hypertension, anxiety disorder presents to the ER today due to.  EMS was called out to her house due to shortness of breath.  Patient's daughter reportedly having flulike symptoms.  When EMS arrived, room air saturations were 84%.  Placed on 4 L oxygen.  O2 saturations increased to 94%.  Per admission report, patient has a history of confusion/dementia.  On arrival temp 99.2 but increased to 101.9.  Heart rate 96, blood pressure 152/69 satting 95% on 4 L.  Labs showed positive COVID negative flu  Lactic acid mildly elevated 2.1  Sodium 139, potassium 3.0, BUN of 11, creatinine 1.69  White count 7.5, hemoglobin 13, platelets 155  Chest x-ray negative for infiltrates.  Due to continued hypoxia, [facility] hospitalist contacted for admission.

## 2022-06-17 NOTE — Progress Notes (Signed)
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 NSTEMI 03/2020-last EF 50-55% Paroxysmal A-fib not on anticoagulation EGD 8/21 gastritis CKD 3B OSA on CPAP Anxiety depression Known thrombocytopenia  Patient sick for 3 to 4 days prior to coming to ED on 11/10 daughter and patient sick flu-like symptoms-in ED found to have O2 sat 84% ED rectal temp 101.9 COVID was positive  S Patient awake somewhat coherent but gets a little confused at times She is wearing oxygen looks comfortable no accessory muscle use No chest pain no chills  O BP 121/61   Pulse 99   Temp (!) 101.9 F (38.8 C) (Rectal)   Resp 17   Ht '5\' 1"'$  (1.549 m)   Wt 84 kg   SpO2 (!) 87%   BMI 34.99 kg/m  Elderly white female looks younger than stated age no icterus no pallor Chest clear no overt rales wheeze or rhonchi S1-S2 no murmur no rub no gallop ROM intact no focal deficit   P Stop IV antibiotics, allow diet, Decadron and molnupiravir, symptomatic management and get COVID labs in the next 24 hours--prone 16 hours a day if possible I-S and flutter For hypokalemia replaced orally with K-Dur 40 Cut back saline to 75 cc/HI and get COVID lab Expect will be here for at least the weekend we will get therapy to see to ensure she is safe for discharge Updated daughter on phone  No charge  Verneita Griffes, MD [facility] 9:28 AM

## 2022-06-17 NOTE — ED Notes (Signed)
Taking po fluids

## 2022-06-17 NOTE — Assessment & Plan Note (Signed)
Stable. On lexapro, xananx, buspar.

## 2022-06-17 NOTE — ED Triage Notes (Signed)
Patient arrived at [facility] with [facility] from home. Patients daughter called because she has been short of breath for 3-4 days. Both the patient and the daughter are sick with flu like symptoms. [facility] reports a room air o2 sat of 84% when placed on 4L the o2 came up to 94%. Patients daughter [name] is the caregiver. Patient has a hx of confusion.

## 2022-06-17 NOTE — Assessment & Plan Note (Signed)
Observation med/surg bed. Acute hypoxia due to covid. No evidence of pneumonia on CXR. No need for abx. Continue with supplemental O2.

## 2022-06-17 NOTE — ED Provider Notes (Signed)
[facility] EMERGENCY DEPARTMENT Provider Note   CSN: [id number] Arrival date & time: 06/17/22  0210     History  Chief Complaint  Patient presents with   Shortness of Breath    Deanna [name] is a 81 y.o. female.  HPI     This is an 81 year old female with history of heart failure who presents with shortness of breath.  Per EMS, daughter called because she has been short of breath for the last 3 to 4 days.  Patient reports that she has been experiencing palpitations as well.  Her daughter is sick at home with flulike symptoms.  Patient denies cough or fevers.  Per EMS, room air sat was 84%.  This improved with 4 L of oxygen.  Patient reports some recent ankle swelling.  Home Medications Prior to Admission medications   Medication Sig Start Date End Date Taking? Authorizing Provider  acetaminophen (TYLENOL) 325 MG tablet Take 325 mg by mouth in the morning and at bedtime. 1 by mouth in the morning and 1 by mouth in the pm.    [provider]  ALPRAZolam Duanne Moron) 0.5 MG tablet Take 1 tablet (0.5 mg total) by mouth daily as needed for anxiety. 04/14/22   Thayer Headings, PMHNP  aspirin EC 81 MG tablet Take 81 mg by mouth at bedtime.     [provider]  busPIRone (BUSPAR) 15 MG tablet Take 1 tablet (15 mg total) by mouth 2 (two) times daily. 04/14/22 07/13/22  Thayer Headings, PMHNP  cholecalciferol (VITAMIN D) 400 units TABS tablet Take 400 Units by mouth daily.    [provider]  clopidogrel (PLAVIX) 75 MG tablet TAKE 1 TABLET(75 MG) BY MOUTH DAILY 05/04/22   Lauree Chandler, NP  dapagliflozin propanediol (FARXIGA) 10 MG TABS tablet TAKE 1 TABLET(10 MG) BY MOUTH DAILY 05/04/22   Lauree Chandler, NP  divalproex (DEPAKOTE ER) 250 MG 24 hr tablet Take 1 tablet (250 mg total) by mouth at bedtime. 04/14/22   Thayer Headings, PMHNP  dorzolamide (TRUSOPT) 2 % ophthalmic solution Place 1 drop into both eyes 2 (two) times daily.    [provider]  escitalopram (LEXAPRO) 20 MG tablet Take 1 tablet (20 mg total) by mouth daily. 04/14/22   Thayer Headings, PMHNP  furosemide (LASIX) 40 MG tablet TAKE 1 TABLET(40 MG) BY MOUTH DAILY 06/03/22   Lauree Chandler, NP  gabapentin (NEURONTIN) 100 MG capsule TAKE 2 CAPSULES(200 MG) BY MOUTH DAILY 05/04/22   Lauree Chandler, NP  latanoprost (XALATAN) 0.005 % ophthalmic solution Place 1 drop into both eyes at bedtime.    [provider]  levothyroxine (SYNTHROID) 175 MCG tablet TAKE 1 TABLET BY MOUTH DAILY. TAKE AT THE SAME TIME DAILY SEPARATE FROM OTHER MEDICATIONS 10/14/21   Lauree Chandler, NP  meclizine (ANTIVERT) 25 MG tablet Take 1 tablet (25 mg total) by mouth as needed for dizziness. 11/30/18   Lauree Chandler, NP  metoprolol tartrate (LOPRESSOR) 25 MG tablet Take 0.5 tablets (12.5 mg total) by mouth 2 (two) times daily. 02/15/22   Lauree Chandler, NP  nitroGLYCERIN (NITROSTAT) 0.4 MG SL tablet Place 1 tablet (0.4 mg total) under the tongue every 5 (five) minutes x 3 doses as needed for chest pain. 05/20/21   Ngetich, Dinah C, NP  pantoprazole (PROTONIX) 40 MG tablet TAKE 1 TABLET EVERY DAY 09/20/21   Lauree Chandler, NP  rosuvastatin (CRESTOR) 40 MG tablet TAKE 1 TABLET(40 MG) BY MOUTH DAILY  02/15/22   Lauree Chandler, NP  vitamin B-12 (CYANOCOBALAMIN) 1000 MCG tablet Take 1,000 mcg by mouth daily.    [provider]      Allergies    Donepezil, Duloxetine hcl, Meloxicam, Memantine, Oxycodone, and Vicodin [hydrocodone-acetaminophen]    Review of Systems   Review of Systems  Constitutional:  Negative for fever.  Respiratory:  Positive for shortness of breath. Negative for cough.   Cardiovascular:  Positive for palpitations. Negative for chest pain.  All other systems reviewed and are negative.   Physical Exam Updated Vital Signs BP 133/66   Pulse 99   Temp (!) 101.9 F (38.8 C) (Rectal)   Resp (!) 24   Ht 1.549 m ('5\' 1"'$ )   Wt 84 kg    SpO2 95%   BMI 34.99 kg/m  Physical Exam Vitals and nursing note reviewed.  Constitutional:      Appearance: She is well-developed. She is obese. She is not ill-appearing.  HENT:     Head: Normocephalic and atraumatic.     Mouth/Throat:     Mouth: Mucous membranes are moist.  Eyes:     Pupils: Pupils are equal, round, and reactive to light.  Cardiovascular:     Rate and Rhythm: Normal rate and regular rhythm.     Heart sounds: Normal heart sounds.  Pulmonary:     Effort: Pulmonary effort is normal. No respiratory distress.     Breath sounds: No wheezing.  Abdominal:     General: Bowel sounds are normal.     Palpations: Abdomen is soft.  Musculoskeletal:     Cervical back: Neck supple.     Comments: Trace bilateral lower extremity edema  Skin:    General: Skin is warm and dry.  Neurological:     Mental Status: She is alert.     Comments: Oriented to person and place, not time  Psychiatric:        Mood and Affect: Mood normal.     ED Results / Procedures / Treatments   Labs (all labs ordered are listed, but only abnormal results are displayed) Labs Reviewed  RESP PANEL BY RT-PCR (FLU A&B, COVID) ARPGX2 - Abnormal; Notable for the following components:      Result Value   SARS Coronavirus 2 by RT PCR POSITIVE (*)    All other components within normal limits  CBC WITH DIFFERENTIAL/PLATELET - Abnormal; Notable for the following components:   RDW 15.6 (*)    All other components within normal limits  COMPREHENSIVE METABOLIC PANEL - Abnormal; Notable for the following components:   Potassium 3.0 (*)    Glucose, Bld 138 (*)    Creatinine, Ser 1.69 (*)    Calcium 8.8 (*)    Total Protein 5.6 (*)    Albumin 3.2 (*)    GFR, Estimated 30 (*)    All other components within normal limits  LACTIC ACID, PLASMA - Abnormal; Notable for the following components:   Lactic Acid, Venous 2.1 (*)    All other components within normal limits  CULTURE, BLOOD (ROUTINE X 2)  CULTURE,  BLOOD (ROUTINE X 2)  URINE CULTURE  LACTIC ACID, PLASMA  URINALYSIS, ROUTINE W REFLEX MICROSCOPIC  TROPONIN I (HIGH SENSITIVITY)  TROPONIN I (HIGH SENSITIVITY)    EKG EKG Interpretation  Date/Time:  Friday June 17 2022 02:18:32 EST Ventricular Rate:  108 PR Interval:  133 QRS Duration: 97 QT Interval:  358 QTC Calculation: 480 R Axis:   36 Text Interpretation: Sinus tachycardia  Repol abnrm suggests ischemia, lateral leads Confirmed by Thayer Jew [phone]) on 06/17/2022 2:40:46 AM  Radiology DG Chest Portable 1 View  Result Date: 06/17/2022 CLINICAL DATA:  Shortness of breath. EXAM: PORTABLE CHEST 1 VIEW COMPARISON:  October 29, 2021 FINDINGS: Multiple sternal wires and surgical clips are seen. The heart size and mediastinal contours are within normal limits. There is marked severity calcification of the aortic arch and descending thoracic aorta. There is no evidence of acute infiltrate, pleural effusion or pneumothorax. Multilevel degenerative changes seen throughout the thoracic spine. IMPRESSION: 1. Evidence of prior median sternotomy/CABG. 2. No acute cardiopulmonary disease. Electronically Signed   By: Virgina Norfolk M.D.   On: 06/17/2022 03:14    Procedures Procedures    Medications Ordered in ED Medications  lactated ringers infusion ( Intravenous New Bag/Given 06/17/22 0414)  cefTRIAXone (ROCEPHIN) 2 g in sodium chloride 0.9 % 100 mL IVPB (0 g Intravenous Stopped 06/17/22 0449)  azithromycin (ZITHROMAX) 500 mg in sodium chloride 0.9 % 250 mL IVPB (500 mg Intravenous New Bag/Given 06/17/22 0450)    ED Course/ Medical Decision Making/ A&P Clinical Course as of 06/17/22 0501  Fri Jun 17, 2022  0426 Patient states she feels much better with oxygen.  Reports that she used oxygen at home for some time.  She is currently on 3-1/2 L. [CH]  0500 Updated patient regarding COVID-19 diagnosis.  Attempted to call daughter.  No answer. [CH]    Clinical Course User  Index [CH] Yoselyn Mcglade, Barbette Hair, MD                           Medical Decision Making Amount and/or Complexity of Data Reviewed Labs: ordered. Radiology: ordered.  Risk Prescription drug management. Decision regarding hospitalization.   This patient presents to the ED for concern of breath, palpitations, this involves an extensive number of treatment options, and is a complaint that carries with it a high risk of complications and morbidity.  I considered the following differential and admission for this acute, potentially life threatening condition.  The differential diagnosis includes pneumonia, pneumothorax, ACS, CHF, viral illness such as COVID or influenza  MDM:    This is an 81 year old female who presents with shortness of breath and palpitations.  She was hypoxic upon EMS arrival.  Reportedly daughter has similar symptoms.  Patient denies infectious symptoms but she is generally a poor historian.  EKG without acute ischemic changes.  Basic lab work obtained.  No significant leukocytosis.  She has a mild lactic acidosis.  Requested a rectal temp it was 101.9.  Sepsis work-up was initiated.  She is not hypotensive so I held fluids given her CHF history.  I did cover her with Rocephin and azithromycin given concern for possible pneumonia and hypoxia.  Other labs are largely reassuring.  COVID and influenza testing sent and COVID testing is positive.  Patient does report that she had at least 1 set of vaccination.  We will plan for admission to the hospitalist given O2 requirement.  (Labs, imaging, consults)  Labs: I Ordered, and personally interpreted labs.  The pertinent results include: CBC, BMP, BNP, lactic  Imaging Studies ordered: I ordered imaging studies including chest x-ray I independently visualized and interpreted imaging. I agree with the radiologist interpretation  Additional history obtained from EMS.  External records from outside source obtained and reviewed including  prior evaluations  Cardiac Monitoring: The patient was maintained on a cardiac monitor.  I personally viewed  and interpreted the cardiac monitored which showed an underlying rhythm of: Sinus rhythm  Reevaluation: After the interventions noted above, I reevaluated the patient and found that they have :improved  Social Determinants of Health:  lives with family  Disposition: Admit  Co morbidities that complicate the patient evaluation  Past Medical History:  Diagnosis Date   Anxiety    Bacteremia, escherichia coli 04/27/2015   Brachial-basilar insufficiency syndrome    CAD (coronary artery disease)    Celiac artery stenosis (HCC)    Chronic bilateral low back pain without sciatica    Chronic combined systolic (congestive) and diastolic (congestive) heart failure ([name]) 11/14/2017   CKD (chronic kidney disease), stage IV (HCC)    Cognitive impairment    Colon polyps    Community acquired pneumonia    Depression    Diverticulosis    Dysuria    Encephalomalacia    GAD (generalized anxiety disorder)    GERD (gastroesophageal reflux disease)    Glaucoma    Hearing loss    Hemorrhoids    Hypertension    Hypothyroidism    Incontinence    Mixed hyperlipidemia    Myocardial infarction ([place])    OSA on CPAP    PAF (paroxysmal atrial fibrillation) ([place])    Peripheral arterial disease ([place])    Pneumonitis 04/26/2015   Pyelonephritis due to Escherichia coli 04/28/2015   Sepsis ([place])    Spondylosis of lumbar region without myelopathy or radiculopathy    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[place])    Urticaria    Vertigo, benign positional      Medicines Meds ordered this encounter  Medications   lactated ringers infusion   cefTRIAXone (ROCEPHIN) 2 g in sodium chloride 0.9 % 100 mL IVPB    Order Specific Question:   Antibiotic Indication:    Answer:   CAP   azithromycin (ZITHROMAX) 500 mg in sodium chloride 0.9 % 250 mL IVPB    Order Specific Question:   Antibiotic Indication:     Answer:   CAP    I have reviewed the patients home medicines and have made adjustments as needed  Problem List / ED Course: Problem List Items Addressed This Visit       Respiratory   Hypoxia - Primary   Other Visit Diagnoses     Acute respiratory failure with hypoxia ([place])       COVID-19       Relevant Medications   cefTRIAXone (ROCEPHIN) 2 g in sodium chloride 0.9 % 100 mL IVPB   azithromycin (ZITHROMAX) 500 mg in sodium chloride 0.9 % 250 mL IVPB                   Final Clinical Impression(s) / ED Diagnoses Final diagnoses:  Hypoxia  Acute respiratory failure with hypoxia ([place])  COVID-19    Rx / DC Orders ED Discharge Orders     None         Dina Rich, Barbette Hair, MD 06/17/22 [phone]

## 2022-06-18 DIAGNOSIS — J9601 Acute respiratory failure with hypoxia: Secondary | ICD-10-CM | POA: Diagnosis not present

## 2022-06-18 LAB — CBC
HCT: 37.8 % (ref 36.0–46.0)
Hemoglobin: 12.5 g/dL (ref 12.0–15.0)
MCH: 29.2 pg (ref 26.0–34.0)
MCHC: 33.1 g/dL (ref 30.0–36.0)
MCV: 88.3 fL (ref 80.0–100.0)
Platelets: 148 10*3/uL — ABNORMAL LOW (ref 150–400)
RBC: 4.28 MIL/uL (ref 3.87–5.11)
RDW: 15.4 % (ref 11.5–15.5)
WBC: 5.1 10*3/uL (ref 4.0–10.5)
nRBC: 0 % (ref 0.0–0.2)

## 2022-06-18 LAB — COMPREHENSIVE METABOLIC PANEL
ALT: 16 U/L (ref 0–44)
AST: 15 U/L (ref 15–41)
Albumin: 3 g/dL — ABNORMAL LOW (ref 3.5–5.0)
Alkaline Phosphatase: 57 U/L (ref 38–126)
Anion gap: 7 (ref 5–15)
BUN: 11 mg/dL (ref 8–23)
CO2: 28 mmol/L (ref 22–32)
Calcium: 8.9 mg/dL (ref 8.9–10.3)
Chloride: 103 mmol/L (ref 98–111)
Creatinine, Ser: 1.41 mg/dL — ABNORMAL HIGH (ref 0.44–1.00)
GFR, Estimated: 38 mL/min — ABNORMAL LOW (ref >60)
Glucose, Bld: 147 mg/dL — ABNORMAL HIGH (ref 70–99)
Potassium: 3.6 mmol/L (ref 3.5–5.1)
Sodium: 138 mmol/L (ref 135–145)
Total Bilirubin: 1 mg/dL (ref 0.3–1.2)
Total Protein: 5.7 g/dL — ABNORMAL LOW (ref 6.5–8.1)

## 2022-06-18 LAB — URINE CULTURE

## 2022-06-18 LAB — D-DIMER, QUANTITATIVE: D-Dimer, Quant: 0.64 ug/mL-FEU — ABNORMAL HIGH (ref 0.00–0.50)

## 2022-06-18 LAB — GLUCOSE, CAPILLARY: Glucose-Capillary: 171 mg/dL — ABNORMAL HIGH (ref 70–99)

## 2022-06-18 LAB — C-REACTIVE PROTEIN: CRP: 1.4 mg/dL — ABNORMAL HIGH (ref <1.0)

## 2022-06-18 LAB — PROCALCITONIN: Procalcitonin: <0.1 ng/mL

## 2022-06-18 NOTE — Progress Notes (Signed)
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 NSTEMI 03/2020-last EF 50-55% Paroxysmal A-fib not on anticoagulation EGD 8/21 gastritis CKD 3B OSA on CPAP Anxiety depression Known thrombocytopenia   Patient sick for 3 to 4 days prior to coming to ED on 11/10 daughter and patient sick flu-like symptoms-in ED found to have O2 sat 84% ED rectal temp 101.9 COVID was positive  Hospital-Problem based course  Acute respiratory failure on admission secondary to COVID -Daughter had similar symptoms for several weeks, - COVID labs do not seem to remarkable and I expect that she will continue to get better slowly over the next several days - Continuing molnupiravir 800 twice daily as well as Decadron 6 mg  CAD with prior NSTEMI 2021 last EF 50-55% - Continue aspirin 81 mg metoprolol 12.5 twice daily Crestor 40 - Troponin is gray zone patient is not having chest pain no further work-up - Prior to admission is on Lasix and this has been held can consider resumption in the outpatient setting  A-fib CHADVASC >4 not on anticoagulation secondary to prior bleed - Metoprolol as above keep on monitors  Mild hypokalemia on admission - Replaced-follow trends  Prior PAD with left-right femorofemoral crossover status post surgery - Continue aspirin as above, resuming Plavix 75 daily  Depression - Continue Xanax 0.5 daily as needed, BuSpar 15 at bedtime and seven-point 5 AM as well as Lexapro 20, continue Depakote 250 at bedtime  Known thrombocytopenia - Follow trends, overall seems to be stable  DVT prophylaxis: Heparin Code Status: Full Family Communication: Called and updated daughter at phone number [phone] Disposition:  Status is:  Inpatient Remains inpatient appropriate because:   Patient has COVID and expect she will be here until she recovers over the next several days   Consultants:  None  Procedures: No  Antimicrobials: Molnupiravir   Subjective: Awake coherent looks well quite hard of hearing-has ambulated only to the restroom no oxygen requirement no chest pain no fever overall feels a little better  Objective: Vitals:   06/17/22 2024 06/18/22 0500 06/18/22 0540 06/18/22 0815  BP:   (!) 153/96 130/77  Pulse:   71 75  Resp:   17 20  Temp:   97.7 F (36.5 C) 97.7 F (36.5 C)  TempSrc:   Oral Oral  SpO2: 96%  98% 95%  Weight:  87.4 kg    Height:        Intake/Output Summary (Last 24 hours) at 06/18/2022 0821 Last data filed at 06/18/2022 0542 Gross per 24 hour  Intake 701.92 ml  Output 400 ml  Net 301.92 ml   Filed Weights   06/17/22 0231 06/18/22 0500  Weight: 84 kg 87.4 kg    Examination:  EOMI NCAT no focal deficit Chest clear no wheeze rales rhonchi S1-S2 no murmur seems to be in sinus Abdomen soft No lower extremity edema Neuro intact moving 4 limbs equally  Data Reviewed: personally reviewed   CBC    Component Value Date/Time   WBC 5.1 06/18/2022 0308   RBC 4.28 06/18/2022 0308   HGB 12.5 06/18/2022 0308   HGB 10.2 (L) 04/22/2020 1118   HCT 37.8 06/18/2022 0308   HCT 30.6 (L) 04/22/2020 1118   PLT 148 (L) 06/18/2022 0308   PLT 108 (L) 04/22/2020  1118   MCV 88.3 06/18/2022 0308   MCV 86 04/22/2020 1118   MCH 29.2 06/18/2022 0308   MCHC 33.1 06/18/2022 0308   RDW 15.4 06/18/2022 0308   RDW 13.5 04/22/2020 1118   LYMPHSABS 0.7 06/17/2022 0256   MONOABS 0.5 06/17/2022 0256   EOSABS 0.1 06/17/2022 0256   BASOSABS 0.0 06/17/2022 0256      Latest Ref Rng & Units 06/18/2022    3:08 AM 06/17/2022    2:56 AM 01/12/2022   12:00 AM  CMP  Glucose 70 - 99 mg/dL 147  138    BUN 8 - 23 mg/dL '11  11  21      '$ Creatinine 0.44 - 1.00 mg/dL 1.41  1.69  1.7      Sodium 135  - 145 mmol/L 138  139  141      Potassium 3.5 - 5.1 mmol/L 3.6  3.0  4.1      Chloride 98 - 111 mmol/L 103  102  99      CO2 22 - 32 mmol/L 28  26  33      Calcium 8.9 - 10.3 mg/dL 8.9  8.8  10.0      Total Protein 6.5 - 8.1 g/dL 5.7  5.6    Total Bilirubin 0.3 - 1.2 mg/dL 1.0  1.2    Alkaline Phos 38 - 126 U/L 57  71    AST 15 - 41 U/L 15  21    ALT 0 - 44 U/L 16  20       This result is from an external source.     Radiology Studies: DG Chest Portable 1 View  Result Date: 06/17/2022 CLINICAL DATA:  Shortness of breath. EXAM: PORTABLE CHEST 1 VIEW COMPARISON:  October 29, 2021 FINDINGS: Multiple sternal wires and surgical clips are seen. The heart size and mediastinal contours are within normal limits. There is marked severity calcification of the aortic arch and descending thoracic aorta. There is no evidence of acute infiltrate, pleural effusion or pneumothorax. Multilevel degenerative changes seen throughout the thoracic spine. IMPRESSION: 1. Evidence of prior median sternotomy/CABG. 2. No acute cardiopulmonary disease. Electronically Signed   By: Virgina Norfolk M.D.   On: 06/17/2022 03:14     Scheduled Meds:  aspirin EC  81 mg Oral QHS   busPIRone  15 mg Oral QHS   busPIRone  7.5 mg Oral Daily   dapagliflozin propanediol  10 mg Oral Daily   dexamethasone  6 mg Oral Daily   divalproex  250 mg Oral QHS   escitalopram  20 mg Oral Daily   gabapentin  200 mg Oral Daily   heparin  5,000 Units Subcutaneous Q8H   levothyroxine  175 mcg Oral Q0600   metoprolol tartrate  12.5 mg Oral BID   molnupiravir EUA  4 capsule Oral BID   pantoprazole  40 mg Oral Daily   potassium chloride  40 mEq Oral Daily   rosuvastatin  40 mg Oral Daily   Continuous Infusions:   LOS: 1 day   Time spent: 20  Nita Sells, MD [facility] To contact the attending provider between 7A-7P or the covering provider during after hours 7P-7A, please log into the web site [email or web address] and  access using universal [place] password for that web site. If you do not have the password, please call the hospital operator.  06/18/2022, 8:21 AM

## 2022-06-19 DIAGNOSIS — J9601 Acute respiratory failure with hypoxia: Secondary | ICD-10-CM | POA: Diagnosis not present

## 2022-06-19 LAB — CBC
HCT: 37.1 % (ref 36.0–46.0)
Hemoglobin: 12.4 g/dL (ref 12.0–15.0)
MCH: 29.4 pg (ref 26.0–34.0)
MCHC: 33.4 g/dL (ref 30.0–36.0)
MCV: 87.9 fL (ref 80.0–100.0)
Platelets: 149 10*3/uL — ABNORMAL LOW (ref 150–400)
RBC: 4.22 MIL/uL (ref 3.87–5.11)
RDW: 15.4 % (ref 11.5–15.5)
WBC: 5 10*3/uL (ref 4.0–10.5)
nRBC: 0 % (ref 0.0–0.2)

## 2022-06-19 LAB — COMPREHENSIVE METABOLIC PANEL
ALT: 15 U/L (ref 0–44)
AST: 19 U/L (ref 15–41)
Albumin: 2.9 g/dL — ABNORMAL LOW (ref 3.5–5.0)
Alkaline Phosphatase: 58 U/L (ref 38–126)
Anion gap: 13 (ref 5–15)
BUN: 20 mg/dL (ref 8–23)
CO2: 25 mmol/L (ref 22–32)
Calcium: 9.2 mg/dL (ref 8.9–10.3)
Chloride: 103 mmol/L (ref 98–111)
Creatinine, Ser: 1.34 mg/dL — ABNORMAL HIGH (ref 0.44–1.00)
GFR, Estimated: 40 mL/min — ABNORMAL LOW (ref >60)
Glucose, Bld: 132 mg/dL — ABNORMAL HIGH (ref 70–99)
Potassium: 4.2 mmol/L (ref 3.5–5.1)
Sodium: 141 mmol/L (ref 135–145)
Total Bilirubin: 0.5 mg/dL (ref 0.3–1.2)
Total Protein: 5.5 g/dL — ABNORMAL LOW (ref 6.5–8.1)

## 2022-06-19 MED ORDER — MOLNUPIRAVIR EUA 200MG CAPSULE: 4.0000 | ORAL_CAPSULE | Freq: Two times a day (BID) | ORAL | 0 refills | Status: AC

## 2022-06-19 MED ORDER — DEXAMETHASONE 6 MG PO TABS: 6.0000 mg | ORAL_TABLET | Freq: Every day | ORAL | 0 refills | Status: DC

## 2022-06-19 NOTE — Plan of Care (Signed)
0700: Pt resting in bed at beginning of shift, pt alert and oriented x3, pt confused at timed and forgetful/anxious, pt re-oriented, pt on room air, pt ambulatory, pt call bell within reach and bed in lowest position.  0800: Pt seen by MD, MD aware pt confused at times/forgetful, per MD pt stable to discharge, RN called pt daughter [name] and updated her, pt to be picked up by pt daughter.

## 2022-06-19 NOTE — Discharge Summary (Signed)
Physician Discharge Summary  Deanna [name] XIP:[id number] DOB: 1941/01/08 DOA: 06/17/2022  PCP: Lauree Chandler, NP  Admit date: 06/17/2022 Discharge date: 06/19/2022  Time spent: 37 minutes  Recommendations for Outpatient Follow-up:  Requires completion of Decadron and molnupiravir for COVID and at the time of discontinuation/completion of Decadron can be off quarantine Recommend Chem-7, CBC in about 2 to 3 weeks Consider work-up for thrombocytopenia as an outpatient   Discharge Diagnoses:  MAIN problem for hospitalization   COVID-19 with hypoxic respiratory failure on admission Prior CAD A-fib CHADVASC >4 not on anticoagulation Prior PAD status post procedure Depression Thrombocytopenia  Please see below for itemized issues addressed in [place]- refer to other progress notes for clarity if needed  Discharge Condition: Improved  Diet recommendation: Heart healthy  Filed Weights   06/18/22 0500 06/18/22 1326 06/19/22 0344  Weight: 87.4 kg 87.4 kg 87.3 kg    History of present illness:  81 year old home dwelling white female 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 NSTEMI 03/2020-last EF 50-55% Paroxysmal A-fib not on anticoagulation EGD 8/21 gastritis CKD 3B OSA on CPAP Anxiety depression Known thrombocytopenia   Patient sick for 3 to 4 days prior to coming to ED on 11/10 daughter and patient sick flu-like symptoms-in ED found to have O2 sat 84% ED rectal temp 101.9 COVID was positive and patient was admitted for the same and started on COVID directed therapies   Hospital-Problem based course   Acute respiratory failure on admission secondary to COVID -Daughter had similar symptoms for several weeks, - COVID labs were not significantly altered from baseline values - Continuing molnupiravir 800 twice daily as well as Decadron 6 mg-we will complete courses of the same in the  outpatient setting as per [facility]   CAD with prior NSTEMI 2021 last EF 50-55% - Continue aspirin 81 mg metoprolol 12.5 twice daily Crestor 40 - Troponin is gray zone patient is not having chest pain no further work-up - Prior to admission is on Lasix this was held during hospital stay and resumed on discharge   A-fib CHADVASC >4 not on anticoagulation secondary to prior bleed - Metoprolol as above keep on monitors   Mild hypokalemia on admission - Replaced-follow trends and as an outpatient resume labs   Prior PAD with left-right femorofemoral crossover status post surgery - Continue aspirin as above, resuming Plavix 75 daily   Depression - Continue Xanax 0.5 daily as needed, BuSpar as per home regimen as well as Lexapro 20, continue Depakote 250 at bedtime   Known thrombocytopenia - Follow trends work-up as an outpatient if deemed necessary     Discharge Exam: Vitals:   06/18/22 2117 06/19/22 0418  BP: 137/81 (!) 140/85  Pulse: 82 100  Resp:  18  Temp: 98 F (36.7 C) 98 F (36.7 C)  SpO2: 95% 97%    Subj on day of d/c   Awake somewhat anxious slightly confused no distress Not on oxygen  General Exam on discharge  EOMI NCAT no focal deficit no icterus no pallor no rales or rhonchi Chest clear otherwise Abdomen soft no rebound ROM intact moving 4 limbs sitting up in bed  Discharge Instructions   Discharge Instructions     Diet - low sodium heart healthy   Complete by: As directed    Discharge instructions   Complete by: As directed    Please look at your medication list carefully 2 new medications have  been added a steroid called Decadron which she will complete in addition to molnupiravir which is an antiviral medication which she should complete as well for your COVID The day that you finish your steroids would be the day that you are considered less infective --- until then please remain quarantined from other people who do not have COVID We have resumed the  majority of your other medications please follow-up with your primary physician in about 10 to 15 days and ensure that you are continuing to do well Get labs in about 3 to 4 weeks to ensure that they are stable If you experience high fevers chills nausea vomiting malaise difficulty walking or chest pain please present back to the emergency room   Increase activity slowly   Complete by: As directed       Allergies as of 06/19/2022       Reactions   Aricept [donepezil] Other (See Comments)   Altered mood Aggression    Cymbalta [duloxetine Hcl] Other (See Comments)   Increased confusion and memory concerns    Mobic [meloxicam] Other (See Comments)   Heart failure   Namenda [memantine] Other (See Comments)   Severe aggression   Oxycontin [oxycodone] Other (See Comments)   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   ALPRAZolam 0.5 MG tablet Commonly known as: Xanax Take 1 tablet (0.5 mg total) by mouth daily as needed for anxiety.   aspirin EC 81 MG tablet Take 81 mg by mouth at bedtime.   busPIRone 15 MG tablet Commonly known as: BUSPAR Take 1 tablet (15 mg total) by mouth 2 (two) times daily. What changed:  how much to take when to take this additional instructions   clopidogrel 75 MG tablet Commonly known as: PLAVIX TAKE 1 TABLET(75 MG) BY MOUTH DAILY What changed: See the new instructions.   dexamethasone 6 MG tablet Commonly known as: DECADRON Take 1 tablet (6 mg total) by mouth daily.   divalproex 250 MG 24 hr tablet Commonly known as: DEPAKOTE ER Take 1 tablet (250 mg total) by mouth at bedtime.   dorzolamide 2 % ophthalmic solution Commonly known as:  TRUSOPT Place 1 drop into both eyes 2 (two) times daily.   escitalopram 20 MG tablet Commonly known as: LEXAPRO Take 1 tablet (20 mg total) by mouth daily. What changed: when to take this   Farxiga 10 MG Tabs tablet Generic drug: dapagliflozin propanediol TAKE 1 TABLET(10 MG) BY MOUTH DAILY What changed: See the new instructions.   furosemide 40 MG tablet Commonly known as: LASIX TAKE 1 TABLET(40 MG) BY MOUTH DAILY What changed: See the new instructions.   gabapentin 100 MG capsule Commonly known as: NEURONTIN TAKE 2 CAPSULES(200 MG) BY MOUTH DAILY What changed: See the new instructions.   latanoprost 0.005 % ophthalmic solution Commonly known as: XALATAN Place 1 drop into both eyes at bedtime.   levothyroxine 175 MCG tablet Commonly known as: SYNTHROID TAKE 1 TABLET BY MOUTH DAILY. TAKE AT THE SAME TIME DAILY SEPARATE FROM OTHER MEDICATIONS What changed: See the new instructions.   meclizine 25 MG tablet Commonly known as: ANTIVERT Take 1 tablet (25 mg  total) by mouth as needed for dizziness.   metoprolol tartrate 25 MG tablet Commonly known as: LOPRESSOR Take 0.5 tablets (12.5 mg total) by mouth 2 (two) times daily.   mirabegron ER 50 MG Tb24 tablet Commonly known as: MYRBETRIQ Take 50 mg by mouth in the morning.   molnupiravir EUA 200 mg Caps capsule Commonly known as: LAGEVRIO Take 4 capsules (800 mg total) by mouth 2 (two) times daily for 3 days.   nitroGLYCERIN 0.4 MG SL tablet Commonly known as: NITROSTAT Place 1 tablet (0.4 mg total) under the tongue every 5 (five) minutes x 3 doses as needed for chest pain.   Nizoral 1 % Sham Generic drug: KETOCONAZOLE (TOPICAL) Apply 1 application  topically daily as needed (rash on scalp).   pantoprazole 40 MG tablet Commonly known as: PROTONIX TAKE 1 TABLET EVERY DAY What changed: when to take this   rosuvastatin 40 MG tablet Commonly known as: CRESTOR TAKE 1 TABLET(40 MG) BY MOUTH DAILY What changed:  how  much to take how to take this when to take this additional instructions   VITAMIN B-12 PO Take 1 tablet by mouth in the morning.   Vitamin D3 125 MCG (5000 UT) Caps Take 5,000 Units by mouth at bedtime.       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    Significant Diagnostic Studies: DG Chest Portable 1 View  Result Date: 06/17/2022 CLINICAL DATA:  Shortness of breath. EXAM: PORTABLE CHEST 1 VIEW COMPARISON:  October 29, 2021 FINDINGS: Multiple sternal wires and surgical clips are seen. The heart size and mediastinal contours are within normal limits. There is marked severity calcification of the aortic arch and descending thoracic aorta. There is no evidence of acute infiltrate, pleural effusion or pneumothorax. Multilevel degenerative changes seen throughout the thoracic spine. IMPRESSION: 1. Evidence of prior median sternotomy/CABG. 2. No acute cardiopulmonary disease. Electronically Signed   By: Virgina Norfolk M.D.   On: 06/17/2022 03:14    Microbiology: Recent Results (from the past 240 hour(s))  Blood culture (routine x 2)     Status: None (Preliminary result)   Collection Time: 06/17/22  2:55 AM   Specimen: BLOOD  Result Value Ref Range Status   Specimen Description BLOOD RIGHT ANTECUBITAL  Final   Special Requests   Final     BOTTLES DRAWN AEROBIC AND ANAEROBIC Blood Culture adequate volume   Culture   Final    NO GROWTH 1 DAY Performed at [facility], [address] [place], [place] [address]    Report Status PENDING  Incomplete  Resp Panel by RT-PCR (Flu A&B, Covid) Anterior Nasal Swab     Status: Abnormal   Collection Time: 06/17/22  2:55 AM   Specimen: Anterior Nasal Swab  Result Value Ref Range Status   SARS Coronavirus 2 by RT PCR POSITIVE (  A) NEGATIVE Final    Comment: (NOTE) SARS-CoV-2 target nucleic acids are DETECTED.  The SARS-CoV-2 RNA is generally detectable in upper respiratory specimens during the acute phase of infection. Positive results are indicative of the presence of the identified virus, but do not rule out bacterial infection or co-infection with other pathogens not detected by the test. Clinical correlation with patient history and other diagnostic information is necessary to determine patient infection status. The expected result is Negative.  Fact Sheet for Patients: EntrepreneurPulse.com.au  Fact Sheet for Healthcare Providers: IncredibleEmployment.be  This test is not yet approved or cleared by the [place] FDA and  has been authorized for detection and/or diagnosis of SARS-CoV-2 by FDA under an Emergency Use Authorization (EUA).  This EUA will remain in effect (meaning this test can be used) for the duration of  the COVID-19 declaration under Section 564(b)(1) of the A ct, 21 U.S.C. section 360bbb-3(b)(1), unless the authorization is terminated or revoked sooner.     Influenza A by PCR NEGATIVE NEGATIVE Final   Influenza B by PCR NEGATIVE NEGATIVE Final    Comment: (NOTE) The Xpert Xpress SARS-CoV-2/FLU/RSV plus assay is intended as an aid in the diagnosis of influenza from Nasopharyngeal swab specimens and should not be used as a sole basis for treatment. Nasal washings and aspirates are unacceptable for Xpert Xpress  SARS-CoV-2/FLU/RSV testing.  Fact Sheet for Patients: EntrepreneurPulse.com.au  Fact Sheet for Healthcare Providers: IncredibleEmployment.be  This test is not yet approved or cleared by the [place] FDA and has been authorized for detection and/or diagnosis of SARS-CoV-2 by FDA under an Emergency Use Authorization (EUA). This EUA will remain in effect (meaning this test can be used) for the duration of the COVID-19 declaration under Section 564(b)(1) of the Act, 21 U.S.C. section 360bbb-3(b)(1), unless the authorization is terminated or revoked.  Performed at [facility], [place] [address], [place], [place] [address]   Blood culture (routine x 2)     Status: None (Preliminary result)   Collection Time: 06/17/22  2:56 AM   Specimen: BLOOD  Result Value Ref Range Status   Specimen Description BLOOD SITE NOT SPECIFIED  Final   Special Requests   Final    BOTTLES DRAWN AEROBIC AND ANAEROBIC Blood Culture adequate volume   Culture   Final    NO GROWTH 1 DAY Performed at [facility], [place] [address], [place], [place] [address]    Report Status PENDING  Incomplete  Urine Culture     Status: Abnormal   Collection Time: 06/17/22  3:52 AM   Specimen: Urine, Clean Catch  Result Value Ref Range Status   Specimen Description URINE, CLEAN CATCH  Final   Special Requests   Final    NONE Performed at [facility], [place] [address] [place], [place] [address]    Culture MULTIPLE SPECIES PRESENT, SUGGEST RECOLLECTION (A)  Final   Report Status 06/18/2022 FINAL  Final     Labs: Basic Metabolic Panel: Recent Labs  Lab 06/17/22 0256 06/18/22 0308 06/19/22 0354  NA 139 138 141  K 3.0* 3.6 4.2  CL 102 103 103  CO2 '26 28 25  '$ GLUCOSE 138* 147* 132*  BUN '11 11 20  '$ CREATININE 1.69* 1.41* 1.34*  CALCIUM 8.8* 8.9 9.2   Liver Function Tests: Recent Labs  Lab 06/17/22 0256 06/18/22 0308 06/19/22 0354  AST '21 15 19  '$ ALT '20 16  15  '$ ALKPHOS 71 57 58  BILITOT 1.2 1.0  0.5  PROT 5.6* 5.7* 5.5*  ALBUMIN 3.2* 3.0* 2.9*   No results for input(s): "LIPASE", "AMYLASE" in the last 168 hours. No results for input(s): "AMMONIA" in the last 168 hours. CBC: Recent Labs  Lab 06/17/22 0256 06/18/22 0308 06/19/22 0354  WBC 7.5 5.1 5.0  NEUTROABS 6.1  --   --   HGB 13.0 12.5 12.4  HCT 39.0 37.8 37.1  MCV 88.8 88.3 87.9  PLT 155 148* 149*   Cardiac Enzymes: No results for input(s): "CKTOTAL", "CKMB", "CKMBINDEX", "TROPONINI" in the last 168 hours. BNP: BNP (last 3 results) No results for input(s): "BNP" in the last 8760 hours.  ProBNP (last 3 results) No results for input(s): "PROBNP" in the last 8760 hours.  CBG: Recent Labs  Lab 06/18/22 1534  GLUCAP 171*       Signed:  Nita Sells MD   [facility] 06/19/2022, 7:54 AM

## 2022-06-20 ENCOUNTER — Telehealth: Payer: Self-pay

## 2022-06-20 NOTE — Patient Outreach (Signed)
  Care Coordination TOC Note Transition Care Management Follow-up Telephone Call Date of discharge and from where: 06/19/22-[facility] Dx: hypoxia How have you been since you were released from the hospital?Spoke with daughter who voiced that patient was resting and doing okay. Caregiver admits that she herself is sick-thinks she has COVID. She has not taken a test or sought medical attention but voices "she is only the person around her mother so she knows her mother got sick from her." RN CM attempted to offer assistance and suggestions for caregiver. She was not interested and stated she did not want to talk any further.   Care Coordination Interventions:  Education provided    Encounter Outcome:  Pt. Visit Completed    Enzo Montgomery, RN,BSN,CCM [place] [facility] Telephonic Care Management Coordinator Direct Phone: [phone] Toll Free: [phone] Fax: [phone]

## 2022-06-21 ENCOUNTER — Other Ambulatory Visit: Payer: Medicare HMO

## 2022-06-22 ENCOUNTER — Ambulatory Visit: Payer: Medicare HMO | Attending: Cardiovascular Disease | Admitting: Cardiovascular Disease

## 2022-06-22 LAB — CULTURE, BLOOD (ROUTINE X 2)
Culture: NO GROWTH
Culture: NO GROWTH
Special Requests: ADEQUATE
Special Requests: ADEQUATE

## 2022-07-14 ENCOUNTER — Encounter: Payer: Self-pay | Admitting: Cardiovascular Disease

## 2022-07-14 DIAGNOSIS — N2581 Secondary hyperparathyroidism of renal origin: Secondary | ICD-10-CM | POA: Diagnosis not present

## 2022-07-14 DIAGNOSIS — E039 Hypothyroidism, unspecified: Secondary | ICD-10-CM | POA: Diagnosis not present

## 2022-07-14 DIAGNOSIS — N1832 Chronic kidney disease, stage 3b: Secondary | ICD-10-CM | POA: Diagnosis not present

## 2022-07-14 DIAGNOSIS — N189 Chronic kidney disease, unspecified: Secondary | ICD-10-CM | POA: Diagnosis not present

## 2022-07-14 DIAGNOSIS — I2510 Atherosclerotic heart disease of native coronary artery without angina pectoris: Secondary | ICD-10-CM | POA: Diagnosis not present

## 2022-07-14 DIAGNOSIS — Z8616 Personal history of COVID-19: Secondary | ICD-10-CM | POA: Diagnosis not present

## 2022-07-14 DIAGNOSIS — D631 Anemia in chronic kidney disease: Secondary | ICD-10-CM | POA: Diagnosis not present

## 2022-07-14 DIAGNOSIS — I129 Hypertensive chronic kidney disease with stage 1 through stage 4 chronic kidney disease, or unspecified chronic kidney disease: Secondary | ICD-10-CM | POA: Diagnosis not present

## 2022-07-14 LAB — CBC AND DIFFERENTIAL
HCT: 41 (ref 36–46)
Hemoglobin: 14.2 (ref 12.0–16.0)
Neutrophils Absolute: 4.8
Platelets: 133 10*3/uL — AB (ref 150–400)
WBC: 8

## 2022-07-14 LAB — BASIC METABOLIC PANEL
BUN: 14 (ref 4–21)
CO2: 24 — AB (ref 13–22)
Chloride: 101 (ref 99–108)
Creatinine: 1.4 — AB (ref 0.5–1.1)
Glucose: 98
Potassium: 3.4 mEq/L — AB (ref 3.5–5.1)
Sodium: 143 (ref 137–147)

## 2022-07-14 LAB — IRON,TIBC AND FERRITIN PANEL
%SAT: 14
Ferritin: 62
Iron: 43
TIBC: 302
UIBC: 259

## 2022-07-14 LAB — CBC: RBC: 4.69 (ref 3.87–5.11)

## 2022-07-14 LAB — COMPREHENSIVE METABOLIC PANEL
Albumin: 4.3 (ref 3.5–5.0)
Calcium: 9.4 (ref 8.7–10.7)
eGFR: 37

## 2022-07-14 LAB — PROTEIN / CREATININE RATIO, URINE: Creatinine, Urine: 129.3

## 2022-07-21 ENCOUNTER — Encounter: Payer: Self-pay | Admitting: Nurse Practitioner

## 2022-08-05 ENCOUNTER — Other Ambulatory Visit: Payer: Self-pay | Admitting: Nurse Practitioner

## 2022-08-06 ENCOUNTER — Other Ambulatory Visit: Payer: Self-pay | Admitting: Nurse Practitioner

## 2022-08-06 DIAGNOSIS — E039 Hypothyroidism, unspecified: Secondary | ICD-10-CM

## 2022-09-11 ENCOUNTER — Other Ambulatory Visit: Payer: Self-pay | Admitting: Nurse Practitioner

## 2022-09-11 DIAGNOSIS — E785 Hyperlipidemia, unspecified: Secondary | ICD-10-CM

## 2022-10-13 ENCOUNTER — Ambulatory Visit: Payer: Medicare HMO | Admitting: Psychiatry

## 2022-11-03 ENCOUNTER — Other Ambulatory Visit: Payer: Self-pay | Admitting: Adult Health

## 2022-11-03 DIAGNOSIS — L219 Seborrheic dermatitis, unspecified: Secondary | ICD-10-CM

## 2022-11-03 NOTE — Telephone Encounter (Signed)
Pharmacy requested refill. Pended Rx and sent to Jessica for approval due to HIGH ALERT Warning.  

## 2022-11-07 ENCOUNTER — Encounter: Payer: Medicare HMO | Admitting: Nurse Practitioner

## 2022-11-07 NOTE — Progress Notes (Signed)
error 

## 2022-11-08 ENCOUNTER — Other Ambulatory Visit: Payer: Self-pay

## 2022-11-08 DIAGNOSIS — F411 Generalized anxiety disorder: Secondary | ICD-10-CM

## 2022-11-08 DIAGNOSIS — F39 Unspecified mood [affective] disorder: Secondary | ICD-10-CM

## 2022-11-08 MED ORDER — ALPRAZOLAM 0.5 MG PO TABS: 0.5000 mg | ORAL_TABLET | Freq: Every day | ORAL | 0 refills | Status: DC | PRN

## 2022-11-08 MED ORDER — ESCITALOPRAM OXALATE 20 MG PO TABS: 20.0000 mg | ORAL_TABLET | Freq: Every day | ORAL | 0 refills | Status: DC

## 2022-11-16 ENCOUNTER — Encounter: Payer: Self-pay | Admitting: Psychiatry

## 2022-11-16 ENCOUNTER — Ambulatory Visit (INDEPENDENT_AMBULATORY_CARE_PROVIDER_SITE_OTHER): Payer: Medicare HMO | Admitting: Psychiatry

## 2022-11-16 DIAGNOSIS — F39 Unspecified mood [affective] disorder: Secondary | ICD-10-CM

## 2022-11-16 DIAGNOSIS — F411 Generalized anxiety disorder: Secondary | ICD-10-CM

## 2022-11-16 MED ORDER — DIVALPROEX SODIUM ER 250 MG PO TB24: 250.0000 mg | ORAL_TABLET | Freq: Every day | ORAL | 1 refills | Status: DC

## 2022-11-16 MED ORDER — ALPRAZOLAM 0.5 MG PO TABS: 0.5000 mg | ORAL_TABLET | Freq: Every day | ORAL | 5 refills | Status: DC | PRN

## 2022-11-16 MED ORDER — ESCITALOPRAM OXALATE 20 MG PO TABS: 20.0000 mg | ORAL_TABLET | Freq: Every day | ORAL | 1 refills | Status: DC

## 2022-11-16 MED ORDER — BUSPIRONE HCL 15 MG PO TABS: 15.0000 mg | ORAL_TABLET | Freq: Two times a day (BID) | ORAL | 1 refills | Status: DC

## 2022-11-16 NOTE — Progress Notes (Signed)
[name] [id number] 05/14/1941 82 y.o.  Subjective:   Patient ID:  Deanna [name] is a 82 y.o. (DOB 03-17-1941) female.  Chief Complaint: No chief complaint on file.   HPI Deanna [name] presents to the office today for follow-up of anxiety and mood disturbance.  Accompanied by her daughter. Daughter reports, "there are more memory issues" over time.  Daughter reports that pt has been experiencing hallucinations in "spurts." She reports that her mother has not mentioned hallucinations for several days and mentioned them almost daily last week. Daughter reports that hallucinations tend to occur more at night and last week occurred during the day. Daughter reports that pt has said she sees a woman with a trench coat walking a poodle. Pt reports that one night she "saw a witch who brought me some wine. I just told her to put it in the fridge." Daughter reports that pt will sometimes come into her room thinking that daughter has called her. Daughter reports that at times pt will acknowledge that these things are not real. Daughter reports that pt will compulsively ask where her cat is at night. They deny paranoia. Daughter reports that Alprazolam helps prevent anxiety and may help her fall asleep. Daughter reports that she will give her a 1/2 tab of Alprazolam prn when anxiety is mild and 1 tab when anxiety is more severe. Daughter reports that pt has been complaining of burning with urination and she plans to get an OTC urinalysis kit to rule out a UTI.  Daughter reports that it has been helpful for patient to take evening medications earlier. Daughter reports that pt has lost interest in things and will have the TV on but does not seem to be watching it. Daughter reports that pt will c/o feeling "bored." She reports that she enjoys it when they go to visit friends. Denies sad mood. Sleeping well overall. Daughter reports that pt is sleeping for long periods of time. Daughter reports that energy  seems low at times. Daughter reports that pt has difficulty waking up regardless of sleep amount. Daughter reports that pt's motivation remains low. Appetite has been decreased. Daughter reports that pt has lost about 35 lbs unintentionally. They have started supplementing with Boost nutritional supplements. Denies SI.   Alprazolam last filled 09/09/22.   Past Psychiatric Medication Trials: Sertraline- initially helpful and then no longer effective Cymbalta- increased blood pressure Prozac-possible adverse reaction Lexapro Depakote Ativan-effective but caused excessive drowsiness Xanax-effective Aricept-anger Namenda- anger Gabapentin- Some improvement in pain and anxiety  Mini-Mental    Flowsheet Row Office Visit from 06/15/2021 in [facility] Office Visit from 03/07/2018 in [facility] Office Visit from 05/30/2014 in [facility]  Total Score (max 30 points ) 26 29 29       PHQ2-9    Flowsheet Row Clinical Support from 05/16/2022 in [facility] Clinical Support from 04/15/2021 in [facility] Office Visit from 05/06/2020 in [facility] Office Visit from 11/29/2017 in [facility] Patient Outreach from 11/17/2015 in [facility]  PHQ-2 Total Score 0 0 0 6 1  PHQ-9 Total Score -- -- -- 18 --      Flowsheet Row ED to Hosp-Admission (Discharged) from 06/17/2022 in [facility] ED from 10/31/2021 in [facility] Emergency Department at [facility] ED  from 10/29/2021 in [facility] Department [facility]   C-SSRS RISK CATEGORY No Risk No Risk No Risk        Review of Systems:  Review of Systems  Genitourinary:  Positive for dysuria.  Musculoskeletal:  Positive for gait problem.  Psychiatric/Behavioral:         Please refer  to HPI    Medications: I have reviewed the patient's current medications.  Current Outpatient Medications  Medication Sig Dispense Refill   acetaminophen (TYLENOL) 325 MG tablet Take 325 mg by mouth 3 (three) times daily as needed for mild pain or headache.     aspirin EC 81 MG tablet Take 81 mg by mouth at bedtime.     Cholecalciferol (VITAMIN D3) 125 MCG (5000 UT) CAPS Take 5,000 Units by mouth at bedtime.     clopidogrel (PLAVIX) 75 MG tablet TAKE 1 TABLET(75 MG) BY MOUTH DAILY 90 tablet 3   Cyanocobalamin (VITAMIN B-12 PO) Take 1 tablet by mouth in the morning.     dapagliflozin propanediol (FARXIGA) 10 MG TABS tablet TAKE 1 TABLET(10 MG) BY MOUTH DAILY 30 tablet 11   dorzolamide (TRUSOPT) 2 % ophthalmic solution Place 1 drop into both eyes 2 (two) times daily.     furosemide (LASIX) 40 MG tablet TAKE 1 TABLET(40 MG) BY MOUTH DAILY 90 tablet 3   gabapentin (NEURONTIN) 100 MG capsule TAKE 2 CAPSULES(200 MG) BY MOUTH DAILY 60 capsule 11   ketoconazole (NIZORAL) 2 % shampoo APPLY 10ML TOPICALLY 2 TIMES A WEEK. 120 mL 5   latanoprost (XALATAN) 0.005 % ophthalmic solution Place 1 drop into both eyes at bedtime.     levothyroxine (SYNTHROID) 175 MCG tablet TAKE 1 TABLET BY MOUTH DAILY. TAKE AT THE SAME TIME SEPARATE FROM OTHER MEDICATIONS. 90 tablet 1   meclizine (ANTIVERT) 25 MG tablet Take 1 tablet (25 mg total) by mouth as needed for dizziness. 30 tablet 0   metoprolol tartrate (LOPRESSOR) 25 MG tablet TAKE 1/2 TABLET(12.5 MG) BY MOUTH TWICE DAILY 90 tablet 1   mirabegron ER (MYRBETRIQ) 50 MG TB24 tablet Take 50 mg by mouth in the morning.     pantoprazole (PROTONIX) 40 MG tablet TAKE 1 TABLET EVERY DAY 90 tablet 1   rosuvastatin (CRESTOR) 40 MG tablet TAKE 1 TABLET(40 MG) BY MOUTH DAILY 90 tablet 1   [START ON 12/14/2022] ALPRAZolam (XANAX) 0.5 MG tablet Take 1 tablet (0.5 mg total) by mouth daily as needed for anxiety. 30 tablet 5   busPIRone (BUSPAR) 15 MG tablet Take 1 tablet (15 mg  total) by mouth 2 (two) times daily. 180 tablet 1   dexamethasone (DECADRON) 6 MG tablet Take 1 tablet (6 mg total) by mouth daily. (Patient not taking: Reported on 11/16/2022) 8 tablet 0   divalproex (DEPAKOTE ER) 250 MG 24 hr tablet Take 1 tablet (250 mg total) by mouth at bedtime. 90 tablet 1   escitalopram (LEXAPRO) 20 MG tablet Take 1 tablet (20 mg total) by mouth daily. 90 tablet 1   KETOCONAZOLE, TOPICAL, (NIZORAL) 1 % SHAM Apply 1 application  topically daily as needed (rash on scalp).     nitroGLYCERIN (NITROSTAT) 0.4 MG SL tablet Place 1 tablet (0.4 mg total) under the tongue every 5 (five) minutes x 3 doses as needed for chest pain. 25 tablet 1   No current facility-administered medications for this visit.    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    Vicodin [Hydrocodone-Acetaminophen] Other (See Comments)    Delirium Confusion Toxic dementia    Past Medical History:  Diagnosis Date   Anxiety    Bacteremia, escherichia coli 04/27/2015   Brachial-basilar insufficiency syndrome    CAD (coronary artery disease)    Celiac artery stenosis    Chronic bilateral low back pain without sciatica    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   CKD (chronic kidney disease), stage IV    Cognitive impairment    Colon polyps    Community acquired pneumonia    Depression    Diverticulosis    Dysuria    Encephalomalacia    GAD (generalized anxiety disorder)    GERD (gastroesophageal reflux disease)     Glaucoma    Hearing loss    Hemorrhoids    Hypertension    Hypothyroidism    Incontinence    Mixed hyperlipidemia    Myocardial infarction    NSTEMI (non-ST elevated myocardial infarction) 12/19/2011   OSA on CPAP    PAF (paroxysmal atrial fibrillation)    Peripheral arterial disease    Pneumonitis 04/26/2015   Pyelonephritis due to Escherichia coli 04/28/2015   Sepsis    Spondylosis of lumbar region without myelopathy or radiculopathy    TBI (traumatic brain injury)    Urticaria    Vertigo, benign positional     Past Medical History, Surgical history, Social history, and Family history were reviewed and updated as appropriate.   Please see review of systems for further details on the patient's review from today.   Objective:   Physical Exam:  There were no vitals taken for this visit.  Physical Exam Constitutional:      General: She is not in acute distress. Musculoskeletal:        General: No deformity.  Neurological:     Mental Status: She is alert and oriented to person, place, and time.     Coordination: Coordination normal.  Psychiatric:        Attention and Perception: Attention normal. She does not perceive visual hallucinations.        Mood and Affect: Mood normal. Mood is not anxious or depressed. Affect is not labile, blunt, angry or inappropriate.        Speech: Speech normal.        Behavior: Behavior is slowed. Behavior is cooperative.        Thought Content: Thought content normal. Thought content is not paranoid or delusional. Thought content does not include homicidal or suicidal ideation. Thought content does not include homicidal or suicidal plan.        Cognition and Memory: Memory is impaired.        Judgment: Judgment normal.     Comments: Insight and judgment are fair Pt does not appear to be responding to internal stimuli at time of exam. She reports that she recently saw a witch and recognized that "she probably wasn't real."     Lab Review:      Component Value Date/Time   NA 143 07/14/2022 0000   K 3.4 (A)  07/14/2022 0000   CL 101 07/14/2022 0000   CO2 24 (A) 07/14/2022 0000   GLUCOSE 132 (H) 06/19/2022 0354   BUN 14 07/14/2022 0000   CREATININE 1.4 (A) 07/14/2022 0000   CREATININE 1.34 (H) 06/19/2022 0354   CREATININE 1.79 (H) 10/11/2021 1539   CALCIUM 9.4 07/14/2022 0000   PROT 5.5 (L) 06/19/2022 0354   ALBUMIN 4.3 07/14/2022 0000   AST 19 06/19/2022 0354   ALT 15 06/19/2022 0354   ALKPHOS 58 06/19/2022 0354   BILITOT 0.5 06/19/2022 0354   GFRNONAA 40 (L) 06/19/2022 0354   GFRNONAA 30 (L) 12/07/2020 1356   GFRAA 35 (L) 12/07/2020 1356       Component Value Date/Time   WBC 8.0 07/14/2022 0000   WBC 5.0 06/19/2022 0354   RBC 4.69 07/14/2022 0000   HGB 14.2 07/14/2022 0000   HGB 10.2 (L) 04/22/2020 1118   HCT 41 07/14/2022 0000   HCT 30.6 (L) 04/22/2020 1118   PLT 133 (A) 07/14/2022 0000   PLT 108 (L) 04/22/2020 1118   MCV 87.9 06/19/2022 0354   MCV 86 04/22/2020 1118   MCH 29.4 06/19/2022 0354   MCHC 33.4 06/19/2022 0354   RDW 15.4 06/19/2022 0354   RDW 13.5 04/22/2020 1118   LYMPHSABS 0.7 06/17/2022 0256   MONOABS 0.5 06/17/2022 0256   EOSABS 0.1 06/17/2022 0256   BASOSABS 0.0 06/17/2022 0256    No results found for: "POCLITH", "LITHIUM"   No results found for: "PHENYTOIN", "PHENOBARB", "VALPROATE", "CBMZ"   .res Assessment: Plan:    Pt seen for 30 minutes and time spent discussing recent hallucinations. Agree with daughter's plan to rule out a UTI since this could cause hallucinations and mental status changes. Discussed other potential causes of hallucinations/delirium, to include infection, dehydration, and some medications. Discussed considering treatment for hallucinations in the future if medical causes have been ruled out and hallucinations are causing considerable distress, interfering with her function, and/or causing safety issues. Recommended contacting office immediately if this occurs.  Daughter reports that at this time Xanax prn seems to be effective for when increased anxiety and hallucinations occur. Will continue Xanax prn anxiety. Advised daughter to monitor response to Xanax prn and if it improves or exacerbates confusion or hallucinations.  Continue Lexapro 20 mg po qd for mood and anxiety.  Continue Depakote ER 250 mg po QHS for mood stabilization.  Continue Buspar 15 mg po BID for anxiety.  Pt to follow-up in 4 months or sooner if clinically indicated.  Patient advised to contact office with any questions, adverse effects, or acute worsening in signs and symptoms.   Diagnoses and all orders for this visit:  Generalized anxiety disorder -     ALPRAZolam (XANAX) 0.5 MG tablet; Take 1 tablet (0.5 mg total) by mouth daily as needed for anxiety. -     busPIRone (BUSPAR) 15 MG tablet; Take 1 tablet (15 mg total) by mouth 2 (two) times daily. -     escitalopram (LEXAPRO) 20 MG tablet; Take 1 tablet (20 mg total) by mouth daily.  Mood disorder -     divalproex (DEPAKOTE ER) 250 MG 24 hr tablet; Take 1 tablet (250 mg total) by mouth at bedtime. -     escitalopram (LEXAPRO) 20 MG tablet; Take 1 tablet (20 mg total) by mouth daily.     Please see After Visit Summary for patient specific instructions.  Future Appointments  Date Time Provider Department Center  03/15/2023  1:00 PM Corie Chiquito, PMHNP  [facility] None  06/05/2023  3:20 PM Eubanks, Janene Harvey, NP [facility] None    No orders of the defined types were placed in this encounter.   -------------------------------

## 2022-11-18 ENCOUNTER — Other Ambulatory Visit: Payer: Self-pay | Admitting: Pharmacist

## 2022-11-18 NOTE — Progress Notes (Signed)
Patient appearing on report for quality metrics.  Outreached patient to discuss medication management. Left voicemail for patient to return my call at their convenience.   Mehak Roskelley, PharmD, BCPS Clinical Pharmacist [facility]  

## 2022-12-14 ENCOUNTER — Other Ambulatory Visit: Payer: Self-pay | Admitting: Nurse Practitioner

## 2022-12-14 ENCOUNTER — Telehealth: Payer: Self-pay

## 2023-02-02 ENCOUNTER — Encounter: Payer: Self-pay | Admitting: Orthopedic Surgery

## 2023-02-02 ENCOUNTER — Ambulatory Visit (INDEPENDENT_AMBULATORY_CARE_PROVIDER_SITE_OTHER): Payer: Medicare HMO | Admitting: Orthopedic Surgery

## 2023-02-02 VITALS — BP 138/86 | HR 76 | Temp 97.8°F | Resp 17 | Ht 61.0 in | Wt 181.0 lb

## 2023-02-02 DIAGNOSIS — W19XXXA Unspecified fall, initial encounter: Secondary | ICD-10-CM

## 2023-02-02 DIAGNOSIS — E039 Hypothyroidism, unspecified: Secondary | ICD-10-CM

## 2023-02-02 DIAGNOSIS — I10 Essential (primary) hypertension: Secondary | ICD-10-CM | POA: Diagnosis not present

## 2023-02-02 DIAGNOSIS — M25512 Pain in left shoulder: Secondary | ICD-10-CM

## 2023-02-02 MED ORDER — PREDNISONE 20 MG PO TABS: ORAL_TABLET | ORAL | 0 refills | Status: AC

## 2023-02-02 NOTE — Patient Instructions (Addendum)
Referral to [facility] made for shoulder pain  Tylenol 1000 mg 2-3 times daily  Heat/ice shoulder/neck   May try [place] 4 % lidocaine patches for pain  Recommend voltaren gel 1% apply to neck and shoulder 2-3x/daily  Will send prednisone taper for pain

## 2023-02-02 NOTE — Progress Notes (Signed)
Careteam: Patient Care Team: Sharon Seller, NP as PCP - General (Geriatric Medicine) Runell Gess, MD as PCP - Cardiology (Cardiology) Hilarie Fredrickson, MD as Consulting Physician (Gastroenterology) Gean Birchwood, MD as Consulting Physician (Orthopedic Surgery) Jethro Bolus, MD as Consulting Physician (Ophthalmology) Alfredo Martinez, MD as Consulting Physician (Urology) Nada Libman, MD as Consulting Physician (Vascular Surgery) Van Clines, MD as Consulting Physician (Neurology) Lucille Passy, MD as Referring Physician (Ophthalmology) Corie Chiquito, PMHNP as Nurse Practitioner (Psychiatry) Van Clines, MD as Consulting Physician (Neurology)  Seen by: Hazle Nordmann, AGNP-C  PLACE OF SERVICE:  [facility]  Advanced Directive information Does Patient Have a Medical Advance Directive?: Yes, Type of Advance Directive: Healthcare Power of Attorney, Does patient want to make changes to medical advance directive?: No - Patient declined  Allergies  Allergen Reactions   Aricept [Donepezil] Other (See Comments)    Altered mood Aggression     Cymbalta [Duloxetine Hcl] Other (See Comments)    Increased confusion and memory concerns    Mobic [Meloxicam] Other (See Comments)    Heart failure   Namenda [Memantine] Other (See Comments)    Severe aggression   Oxycontin [Oxycodone] Other (See Comments)    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 female seen today for acute visit due to recent fall.   Daughter present during encounter.   About 1 week ago she was in her wheelchair and wheelchair collapsed while trying to go over curb. She landed on her left side, including her head. She  did not want to go to the ED, described head injury as minor. She has had increased neck and shoulder pain since incident. Denies headaches, N/V. Left extremity with limited ROM. She was referred to neurology by another provider due to ongoing neuropathy, but did not end up going. Left shoulder pain begins near [facility] joint and bands around to left scapula. Pain rated 6/10, described as sharp. No radiation down arm. She is able to move neck without difficulty. She has been taking tylenol and gabapentin without success. Requesting referral to Orthopedics.   Blood pressure controlled with furosemide and metoprolol.   Remains on levothyroxine for hypothyroidism. Does not always take on empty stomach. She does have some intermittent fatigue.   Review of Systems:  Review of Systems  Constitutional:  Positive for malaise/fatigue.  HENT: Negative.    Eyes: Negative.   Respiratory:  Negative for cough, shortness of breath and wheezing.   Cardiovascular:  Negative for chest pain and leg swelling.  Gastrointestinal:  Negative for nausea and vomiting.  Genitourinary: Negative.   Musculoskeletal:  Positive for back pain, falls and joint pain.  Skin: Negative.   Neurological:  Positive for sensory change and weakness. Negative for dizziness and headaches.  Psychiatric/Behavioral:  Positive for depression. The patient is nervous/anxious.     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   CKD (chronic kidney disease), stage IV (HCC)    Cognitive impairment    Colon polyps    Community acquired pneumonia    Depression    Diverticulosis    Dysuria    Encephalomalacia    GAD (generalized anxiety  disorder)    GERD (gastroesophageal reflux disease)    Glaucoma    Hearing loss    Hemorrhoids    Hypertension    Hypothyroidism    Incontinence    Mixed hyperlipidemia    Myocardial infarction [facility])    NSTEMI (non-ST elevated myocardial infarction) (HCC) 12/19/2011   OSA on CPAP    PAF (paroxysmal atrial fibrillation) (HCC)    Peripheral arterial disease (HCC)    Pneumonitis 04/26/2015   Pyelonephritis due to Escherichia coli 04/28/2015   Sepsis (HCC)    Spondylosis of lumbar region without myelopathy or radiculopathy    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HCC)    Urticaria    Vertigo, benign positional    Past Surgical History:  Procedure Laterality Date   ABDOMINAL AORTOGRAM W/LOWER EXTREMITY N/A 08/22/2017   Procedure: ABDOMINAL AORTOGRAM W/LOWER EXTREMITY;  Surgeon: Nada Libman, MD;  Location: [facility] INVASIVE CV LAB;  Service: Cardiovascular;  Laterality: N/A;   BILATERAL UPPER EXTREMITY ANGIOGRAM N/A 09/11/2012   Procedure: BILATERAL UPPER EXTREMITY ANGIOGRAM;  Surgeon: Nada Libman, MD;  Location: [facility] CATH LAB;  Service: Cardiovascular;  Laterality: N/A;   BIOPSY  03/25/2020   Procedure: BIOPSY;  Surgeon: Lemar Lofty., MD;  Location: [facility] ENDOSCOPY;  Service: Gastroenterology;;   carotid duplex doppler Bilateral 09/03/2012, 11/03/2011   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 patent left common carotid- subclavian artery bypass with turbulent flow noted at the anastomosis with velocities of 295 cm/s   CAROTID-SUBCLAVIAN BYPASS GRAFT  12/15/2011   Procedure: BYPASS GRAFT CAROTID-SUBCLAVIAN;  Surgeon: Nada Libman, MD;  Location: [facility] OR;  Service: Vascular;  Laterality: Left;  Left Carotid subclavian bypass   CORONARY ANGIOPLASTY     CORONARY ARTERY BYPASS GRAFT     CORONARY ATHERECTOMY N/A 03/19/2020   Procedure:  CORONARY ATHERECTOMY;  Surgeon: Corky Crafts, MD;  Location: [facility] INVASIVE CV LAB;  Service: Cardiovascular;  Laterality: N/A;   CORONARY STENT INTERVENTION N/A 03/19/2020   Procedure: CORONARY STENT INTERVENTION;  Surgeon: Corky Crafts, MD;  Location: [facility] INVASIVE CV LAB;  Service: Cardiovascular;  Laterality: N/A;   DOPPLER ECHOCARDIOGRAPHY  05/27/2010, 09/17/2008   Mild Proximal septal thickening is noted. Left ventricular systolic functions is normal ejection fraction =>55%. the aortic valve appears to be mildly sclerotic    ESOPHAGOGASTRODUODENOSCOPY (EGD) WITH PROPOFOL N/A 03/25/2020   Procedure: ESOPHAGOGASTRODUODENOSCOPY (EGD) WITH PROPOFOL;  Surgeon: Meridee Score Netty Starring., MD;  Location: [facility] ENDOSCOPY;  Service: Gastroenterology;  Laterality: N/A;   fem-fem bypass graft  1999   holter monitor  01/21/2008   The predominant rhythm was normal sinus rhythm. Minimum heartrate of 63 bpm at +01:00, maximum heartrate of 105 bpm at + 10:35; and the average heartrate of 75 bpm. Ventricular ectopic activity totaled 1270: Multifocal; 866-PVC's and 404-VEs              JOINT REPLACEMENT     Left knee   LEFT HEART CATH AND CORS/GRAFTS ANGIOGRAPHY N/A 03/19/2020   Procedure: LEFT HEART CATH AND CORS/GRAFTS ANGIOGRAPHY;  Surgeon: Corky Crafts, MD;  Location: [facility] INVASIVE CV LAB;  Service: Cardiovascular;  Laterality: N/A;   LEFT HEART CATHETERIZATION WITH CORONARY/GRAFT ANGIOGRAM N/A 12/21/2011   Procedure: LEFT HEART CATHETERIZATION WITH Isabel Caprice;  Surgeon: Marykay Lex, MD;  Location: [facility] CATH LAB;  Service: Cardiovascular;  Laterality: N/A;   NM MYOCAR PERF EJECTION FRACTION  09/22/2009, 07/03/2007   the post stress myocardial perfusion images show a normal pattern of perfusion is all regions. The post-stress ejection fraction is 68 %. no significant wall motion abnormalities noted. This is a low risk scan.   PERIPHERAL VASCULAR INTERVENTION  08/22/2017   Procedure:  PERIPHERAL VASCULAR INTERVENTION;  Surgeon: Nada Libman, MD;  Location: [facility] INVASIVE CV LAB;  Service: Cardiovascular;;  Fem/Fem Graft   REPLACEMENT TOTAL KNEE  05-2011   UNILATERAL UPPER EXTREMEITY ANGIOGRAM Left 11/15/2011   Procedure: UNILATERAL UPPER Salvadore Oxford;  Surgeon: Runell Gess, MD;  Location: [facility] CATH LAB;  Service: Cardiovascular;  Laterality: Left;   Social History:   reports that she quit smoking about 41 years ago. Her smoking use included cigarettes. She has never used smokeless tobacco. She reports that she does not currently use alcohol. She reports that she does not use drugs.  Family History  Problem Relation Age of Onset   Heart attack Mother    Heart disease Mother        before age 4   Diabetes Father    Heart disease Father    Hypertension Father    Hyperlipidemia Father    Heart attack Father 53   Heart attack Brother 22   Cerebral palsy Sister 78   Congestive Heart Failure Sister 108   Heart attack Sister 27   Hypertension Sister 64   Dementia Sister    Anxiety disorder Sister    Anxiety disorder Sister 69   Heart Problems Sister    Stroke Sister    Heart Problems Sister 18   Heart attack Sister 17   Colon cancer Brother 12   Prostate cancer Brother 62   Hypertension Daughter    Irritable bowel syndrome Daughter    Depression Daughter    Anxiety disorder Daughter     Medications: Patient's Medications  New Prescriptions   No medications on file  Previous Medications   ACETAMINOPHEN (TYLENOL) 325 MG TABLET    Take 325 mg by mouth 3 (three) times daily as needed for mild pain or headache.   ALPRAZOLAM (XANAX) 0.5 MG TABLET    Take 1 tablet (0.5 mg total) by mouth daily as needed for anxiety.   ASPIRIN EC 81 MG TABLET    Take 81 mg by mouth at bedtime.   BUSPIRONE (BUSPAR) 15 MG TABLET    Take 1 tablet (15 mg total) by mouth 2 (two) times daily.   CHOLECALCIFEROL (VITAMIN D3) 125 MCG (5000 UT) CAPS    Take 5,000 Units by mouth at  bedtime.   CLOPIDOGREL (PLAVIX) 75 MG TABLET    TAKE 1 TABLET(75 MG) BY MOUTH DAILY   CYANOCOBALAMIN (VITAMIN B-12 PO)    Take 1 tablet by mouth in the morning.   DAPAGLIFLOZIN PROPANEDIOL (FARXIGA) 10 MG TABS TABLET    TAKE 1 TABLET(10 MG) BY MOUTH DAILY   DEXAMETHASONE (DECADRON) 6 MG TABLET    Take 1 tablet (6 mg total) by mouth daily.   DIVALPROEX (DEPAKOTE ER) 250 MG 24 HR TABLET    Take 1 tablet (250 mg total) by mouth at bedtime.   DORZOLAMIDE (TRUSOPT) 2 % OPHTHALMIC SOLUTION  Place 1 drop into both eyes 2 (two) times daily.   ESCITALOPRAM (LEXAPRO) 20 MG TABLET    Take 1 tablet (20 mg total) by mouth daily.   FUROSEMIDE (LASIX) 40 MG TABLET    TAKE 1 TABLET(40 MG) BY MOUTH DAILY   GABAPENTIN (NEURONTIN) 100 MG CAPSULE    TAKE 2 CAPSULES(200 MG) BY MOUTH DAILY   KETOCONAZOLE (NIZORAL) 2 % SHAMPOO    APPLY TOPICALLY 2 TIMES A WEEK.   KETOCONAZOLE, TOPICAL, (NIZORAL) 1 % SHAM    Apply 1 application  topically daily as needed (rash on scalp).   LATANOPROST (XALATAN) 0.005 % OPHTHALMIC SOLUTION    Place 1 drop into both eyes at bedtime.   LEVOTHYROXINE (SYNTHROID) 175 MCG TABLET    TAKE 1 TABLET BY MOUTH DAILY. TAKE AT THE SAME TIME SEPARATE FROM OTHER MEDICATIONS.   MECLIZINE (ANTIVERT) 25 MG TABLET    Take 1 tablet (25 mg total) by mouth as needed for dizziness.   METOPROLOL TARTRATE (LOPRESSOR) 25 MG TABLET    TAKE 1/2 TABLET(12.5 MG) BY MOUTH TWICE DAILY   MIRABEGRON ER (MYRBETRIQ) 50 MG TB24 TABLET    Take 50 mg by mouth in the morning.   NITROGLYCERIN (NITROSTAT) 0.4 MG SL TABLET    Place 1 tablet (0.4 mg total) under the tongue every 5 (five) minutes x 3 doses as needed for chest pain.   PANTOPRAZOLE (PROTONIX) 40 MG TABLET    TAKE 1 TABLET EVERY DAY   ROSUVASTATIN (CRESTOR) 40 MG TABLET    TAKE 1 TABLET(40 MG) BY MOUTH DAILY  Modified Medications   No medications on file  Discontinued Medications   No medications on file    Physical Exam:  Vitals:   02/02/23 1015  BP:  138/86  Pulse: 76  Resp: 17  Temp: 97.8 F (36.6 C)  TempSrc: Temporal  SpO2: 94%  Weight: 181 lb (82.1 kg)  Height: 5\' 1"  (1.549 m)   Body mass index is 34.2 kg/m. Wt Readings from Last 3 Encounters:  02/02/23 181 lb (82.1 kg)  06/19/22 192 lb 7.4 oz (87.3 kg)  04/14/22 185 lb 12.8 oz (84.3 kg)    Physical Exam Vitals reviewed.  Constitutional:      General: She is not in acute distress. HENT:     Head: Normocephalic and atraumatic.  Eyes:     General:        Right eye: No discharge.        Left eye: No discharge.  Cardiovascular:     Rate and Rhythm: Normal rate and regular rhythm.     Pulses: Normal pulses.     Heart sounds: Normal heart sounds.  Pulmonary:     Effort: Pulmonary effort is normal. No respiratory distress.     Breath sounds: Normal breath sounds. No wheezing.  Abdominal:     General: Bowel sounds are normal.     Palpations: Abdomen is soft.  Musculoskeletal:     Left shoulder: Tenderness present. No swelling, deformity or crepitus. Decreased range of motion. Decreased strength.     Left elbow: Normal.     Left forearm: Normal.     Left wrist: Normal.     Left hand: Normal.     Cervical back: Neck supple. Tenderness present. No swelling or rigidity. Normal range of motion.     Right lower leg: No edema.     Left lower leg: No edema.     Comments: Positive drop arm to left arm, left  hand grip 4/5, no apparent deformity, tenderness along left trapezius  Skin:    General: Skin is warm.     Capillary Refill: Capillary refill takes less than 2 seconds.  Neurological:     General: No focal deficit present.     Mental Status: She is alert and oriented to person, place, and time.  Psychiatric:        Mood and Affect: Mood normal.        Behavior: Behavior normal.     Labs reviewed: Basic Metabolic Panel: Recent Labs    06/17/22 0256 06/18/22 0308 06/19/22 0354 07/14/22 0000  NA 139 138 141 143  K 3.0* 3.6 4.2 3.4*  CL 102 103 103 101   CO2 26 28 25  24*  GLUCOSE 138* 147* 132*  --   BUN 11 11 20 14   CREATININE 1.69* 1.41* 1.34* 1.4*  CALCIUM 8.8* 8.9 9.2 9.4   Liver Function Tests: Recent Labs    06/17/22 0256 06/18/22 0308 06/19/22 0354 07/14/22 0000  AST 21 15 19   --   ALT 20 16 15   --   ALKPHOS 71 57 58  --   BILITOT 1.2 1.0 0.5  --   PROT 5.6* 5.7* 5.5*  --   ALBUMIN 3.2* 3.0* 2.9* 4.3   No results for input(s): "LIPASE", "AMYLASE" in the last 8760 hours. No results for input(s): "AMMONIA" in the last 8760 hours. CBC: Recent Labs    06/17/22 0256 06/18/22 0308 06/19/22 0354 07/14/22 0000  WBC 7.5 5.1 5.0 8.0  NEUTROABS 6.1  --   --  4.80  HGB 13.0 12.5 12.4 14.2  HCT 39.0 37.8 37.1 41  MCV 88.8 88.3 87.9  --   PLT 155 148* 149* 133*   Lipid Panel: No results for input(s): "CHOL", "HDL", "LDLCALC", "TRIG", "CHOLHDL", "LDLDIRECT" in the last 8760 hours. TSH: No results for input(s): "TSH" in the last 8760 hours. A1C: Lab Results  Component Value Date   HGBA1C 5.8 (H) 03/18/2020     Assessment/Plan 1. Acute pain of left shoulder - fall 1 week ago> fell on left side - + drop arm test, pain from [facility] banding to left scapula, hand grip 4/5 - appears she has seen Dr. Turner Daniels in past> requesting new referral to [facility] - suspect left shoulder impingement or rotator cuff involvement based on exam - 11/2021 MRI left shoulder noted partial thickness tear of mid/posterior supraspinatus tendon, moderate degenerative changes to [facility] joint  - cont tylenol and gabapentin - will trial prednisone taper  - Ambulatory referral to Orthopedic Surgery - predniSONE (DELTASONE) 20 MG tablet; Take 2 tablets (40 mg total) by mouth daily with breakfast for 2 days, THEN 1 tablet (20 mg total) daily with breakfast for 5 days.  Dispense: 9 tablet; Refill: 0  2. Essential hypertension - controlled with furosemide and metoprolol - CBC with Differential/Platelet - Complete Metabolic Panel with eGFR  3. Acquired  hypothyroidism - on Levothyroxine - does not always take on empty stomach - has intermittent fatigue - recheck TSH  - TSH  4. Fall, initial encounter - ambulates with wheelchair - fall 1 week ago - w/c collapsed going over curb - landed on left side - discussed falls safety    Total time: 32 minutes. Greater than 50% of total time spent doing patient education regarding left shoulder pain, falls safety, HTN and hypothyroidism including symptom/medication management.   Next appt: none Kortland Nichols Scherry Ran  [facility] [phone]

## 2023-02-03 ENCOUNTER — Other Ambulatory Visit: Payer: Self-pay | Admitting: Orthopedic Surgery

## 2023-02-03 DIAGNOSIS — E039 Hypothyroidism, unspecified: Secondary | ICD-10-CM

## 2023-02-03 LAB — CBC WITH DIFFERENTIAL/PLATELET
Absolute Monocytes: 608 cells/uL (ref 200–950)
Basophils Absolute: 57 cells/uL (ref 0–200)
Basophils Relative: 0.6 %
Eosinophils Absolute: 124 cells/uL (ref 15–500)
Eosinophils Relative: 1.3 %
HCT: 43.3 % (ref 35.0–45.0)
Hemoglobin: 14.5 g/dL (ref 11.7–15.5)
Lymphs Abs: 2765 cells/uL (ref 850–3900)
MCH: 28.8 pg (ref 27.0–33.0)
MCHC: 33.5 g/dL (ref 32.0–36.0)
MCV: 86.1 fL (ref 80.0–100.0)
MPV: 9.8 fL (ref 7.5–12.5)
Monocytes Relative: 6.4 %
Neutro Abs: 5947 cells/uL (ref 1500–7800)
Neutrophils Relative %: 62.6 %
Platelets: 204 10*3/uL (ref 140–400)
RBC: 5.03 10*6/uL (ref 3.80–5.10)
RDW: 13.3 % (ref 11.0–15.0)
Total Lymphocyte: 29.1 %
WBC: 9.5 10*3/uL (ref 3.8–10.8)

## 2023-02-03 LAB — COMPLETE METABOLIC PANEL WITH GFR
AG Ratio: 1.8 (calc) (ref 1.0–2.5)
ALT: 10 U/L (ref 6–29)
AST: 11 U/L (ref 10–35)
Albumin: 4.1 g/dL (ref 3.6–5.1)
Alkaline phosphatase (APISO): 89 U/L (ref 37–153)
BUN/Creatinine Ratio: 16 (calc) (ref 6–22)
BUN: 22 mg/dL (ref 7–25)
CO2: 28 mmol/L (ref 20–32)
Calcium: 9.4 mg/dL (ref 8.6–10.4)
Chloride: 99 mmol/L (ref 98–110)
Creat: 1.38 mg/dL — ABNORMAL HIGH (ref 0.60–0.95)
Globulin: 2.3 g/dL (calc) (ref 1.9–3.7)
Glucose, Bld: 98 mg/dL (ref 65–99)
Potassium: 3.7 mmol/L (ref 3.5–5.3)
Sodium: 141 mmol/L (ref 135–146)
Total Bilirubin: 1 mg/dL (ref 0.2–1.2)
Total Protein: 6.4 g/dL (ref 6.1–8.1)
eGFR: 38 mL/min/{1.73_m2} — ABNORMAL LOW (ref >=60)

## 2023-02-03 LAB — TSH: TSH: 9.97 mIU/L — ABNORMAL HIGH (ref 0.40–4.50)

## 2023-02-11 ENCOUNTER — Other Ambulatory Visit: Payer: Self-pay | Admitting: Nurse Practitioner

## 2023-02-11 DIAGNOSIS — E039 Hypothyroidism, unspecified: Secondary | ICD-10-CM

## 2023-02-13 NOTE — Progress Notes (Deleted)
Office Visit Note   Patient: Deanna [name]           Date of Birth: 1940/12/14           MRN: [id number] Visit Date: 02/14/2023              Requested by: Octavia Heir, NP [phone] [address] [place],  [place] [address] PCP: Sharon Seller, NP   Assessment & Plan: Visit Diagnoses: No diagnosis found.  Plan: ***  Follow-Up Instructions: No follow-ups on file.   Orders:  No orders of the defined types were placed in this encounter.  No orders of the defined types were placed in this encounter.     Procedures: No procedures performed   Clinical Data: No additional findings.   Subjective: No chief complaint on file.   HPI  Review of Systems  Constitutional: Negative.   HENT: Negative.    Eyes: Negative.   Respiratory: Negative.    Cardiovascular: Negative.   Endocrine: Negative.   Musculoskeletal: Negative.   Neurological: Negative.   Hematological: Negative.   Psychiatric/Behavioral: Negative.    All other systems reviewed and are negative.   Objective: Vital Signs: There were no vitals taken for this visit.  Physical Exam Vitals and nursing note reviewed.  Constitutional:      Appearance: She is well-developed.  HENT:     Head: Atraumatic.     Nose: Nose normal.  Eyes:     Extraocular Movements: Extraocular movements intact.  Cardiovascular:     Pulses: Normal pulses.  Pulmonary:     Effort: Pulmonary effort is normal.  Abdominal:     Palpations: Abdomen is soft.  Musculoskeletal:     Cervical back: Neck supple.  Skin:    General: Skin is warm.     Capillary Refill: Capillary refill takes less than 2 seconds.  Neurological:     Mental Status: She is alert. Mental status is at baseline.  Psychiatric:        Behavior: Behavior normal.        Thought Content: Thought content normal.        Judgment: Judgment normal.   Ortho Exam  Specialty Comments:  No specialty comments available.  Imaging: No results found.   PMFS  History: Patient Active Problem List   Diagnosis Date Noted  . Acute respiratory failure with hypoxia (HCC) 06/17/2022  . COVID-19 virus infection 06/17/2022  . Stage 3b chronic kidney disease (CKD) (HCC) - baseline SCr 1.8 06/17/2022  . PAD (peripheral artery disease) (HCC) 10/11/2021  . PAF (paroxysmal atrial fibrillation) (HCC) 10/11/2021  . Hypokalemia 05/09/2021  . Status post coronary artery stent placement   . Overactive bladder 12/16/2019  . Generalized anxiety disorder 06/17/2018  . Mood disorder (HCC) 06/17/2018  . Chronic diastolic CHF (congestive heart failure) (HCC) 11/16/2017  . OSA on CPAP 04/26/2015  . Chronic pain 04/26/2015  . Physical deconditioning 04/26/2015  . Hyperlipidemia 07/02/2014  . Carotid artery stenosis 03/26/2012  . Shortness of breath 12/19/2011    Class: Acute  . 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 PERSONAL HX COLONIC POLYPS 06/18/2010  . Hypothyroidism 11/17/2008  . Obesity, Class III, BMI 40-49.9 (morbid obesity) (HCC) 11/17/2008  . ANXIETY DEPRESSION 11/17/2008  . Essential hypertension 11/17/2008  . HEMORRHOIDS 11/17/2008  . GERD 11/17/2008  . IBS 11/17/2008  . Atherosclerosis of coronary artery bypass graft(s), unspecified, with other forms of angina pectoris [facility])    Past Medical History:  Diagnosis Date  . Anxiety   . Bacteremia, escherichia coli 04/27/2015  . Brachial-basilar insufficiency syndrome   . CAD (coronary artery disease)   . Celiac artery stenosis (HCC)   . Chronic bilateral low back pain without sciatica   . 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  . CKD (chronic kidney disease), stage IV (HCC)   . Cognitive impairment   . Colon polyps   . Community acquired pneumonia   . Depression   . Diverticulosis   . Dysuria   . Encephalomalacia   . GAD (generalized anxiety disorder)   . GERD (gastroesophageal reflux disease)   . Glaucoma    . Hearing loss   . Hemorrhoids   . Hypertension   . Hypothyroidism   . Incontinence   . Mixed hyperlipidemia   . Myocardial infarction (HCC)   . NSTEMI (non-ST elevated myocardial infarction) (HCC) 12/19/2011  . OSA on CPAP   . PAF (paroxysmal atrial fibrillation) (HCC)   . Peripheral arterial disease (HCC)   . Pneumonitis 04/26/2015  . Pyelonephritis due to Escherichia coli 04/28/2015  . Sepsis (HCC)   . Spondylosis of lumbar region without myelopathy or radiculopathy   . 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HCC)   . Urticaria   . Vertigo, benign positional     Family History  Problem Relation Age of Onset  . Heart attack Mother   . Heart disease Mother        before age 23  . Diabetes Father   . Heart disease Father   . Hypertension Father   . Hyperlipidemia Father   . Heart attack Father 38  . Heart attack Brother 50  . Cerebral palsy Sister 17  . Congestive Heart Failure Sister 80  . Heart attack Sister 20  . Hypertension Sister 70  . Dementia Sister   . Anxiety disorder Sister   . Anxiety disorder Sister 4  . Heart Problems Sister   . Stroke Sister   . Heart Problems Sister 59  . Heart attack Sister 81  . Colon cancer Brother 35  . Prostate cancer Brother 15  . Hypertension Daughter   . Irritable bowel syndrome Daughter   . Depression Daughter   . Anxiety disorder Daughter     Past Surgical History:  Procedure Laterality Date  . ABDOMINAL AORTOGRAM W/LOWER EXTREMITY N/A 08/22/2017   Procedure: ABDOMINAL AORTOGRAM W/LOWER EXTREMITY;  Surgeon: Nada Libman, MD;  [facility] INVASIVE CV LAB;  Service: Cardiovascular;  Laterality: N/A;  . BILATERAL UPPER EXTREMITY ANGIOGRAM N/A 09/11/2012   Procedure: BILATERAL UPPER EXTREMITY ANGIOGRAM;  Surgeon: Nada Libman, MD;  Location: [facility] CATH LAB;  Service: Cardiovascular;  Laterality: N/A;  . BIOPSY  03/25/2020   Procedure: BIOPSY;  Surgeon: Lemar Lofty., MD;  Location: [facility] ENDOSCOPY;  Service:  Gastroenterology;;  . carotid duplex doppler Bilateral 09/03/2012, 11/03/2011   Evidence of 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 patent left common carotid- subclavian artery bypass with turbulent flow noted at the anastomosis with velocities of 295 cm/s  . CAROTID-SUBCLAVIAN BYPASS GRAFT  12/15/2011   Procedure: BYPASS GRAFT CAROTID-SUBCLAVIAN;  Surgeon: Nada Libman, MD;  Location: [facility] OR;  Service: Vascular;  Laterality: Left;  Left Carotid subclavian bypass  . CORONARY ANGIOPLASTY    . CORONARY ARTERY BYPASS GRAFT    . CORONARY ATHERECTOMY N/A 03/19/2020   Procedure: CORONARY ATHERECTOMY;  Surgeon: Corky Crafts, MD;  Location: [facility] INVASIVE CV LAB;  Service: Cardiovascular;  Laterality: N/A;  . CORONARY STENT INTERVENTION N/A 03/19/2020   Procedure: CORONARY STENT INTERVENTION;  Surgeon: Corky Crafts, MD;  Location: [facility] INVASIVE CV LAB;  Service: Cardiovascular;  Laterality: N/A;  . DOPPLER ECHOCARDIOGRAPHY  05/27/2010, 09/17/2008   Mild Proximal septal thickening is noted. Left ventricular systolic functions is normal ejection fraction =>55%. the aortic valve appears to be mildly sclerotic   . ESOPHAGOGASTRODUODENOSCOPY (EGD) WITH PROPOFOL N/A 03/25/2020   Procedure: ESOPHAGOGASTRODUODENOSCOPY (EGD) WITH PROPOFOL;  Surgeon: Meridee Score Netty Starring., MD;  Location: [facility] ENDOSCOPY;  Service: Gastroenterology;  Laterality: N/A;  . fem-fem bypass graft  1999  . holter monitor  01/21/2008   The predominant rhythm was normal sinus rhythm. Minimum heartrate of 63 bpm at +01:00, maximum heartrate of 105 bpm at + 10:35; and the average heartrate of 75 bpm. Ventricular ectopic activity totaled 1270: Multifocal; 866-PVC's and 404-VEs             . JOINT REPLACEMENT     Left knee  . LEFT HEART CATH AND CORS/GRAFTS ANGIOGRAPHY N/A 03/19/2020   Procedure:  LEFT HEART CATH AND CORS/GRAFTS ANGIOGRAPHY;  Surgeon: Corky Crafts, MD;  Location: [facility] INVASIVE CV LAB;  Service: Cardiovascular;  Laterality: N/A;  . LEFT HEART CATHETERIZATION WITH CORONARY/GRAFT ANGIOGRAM N/A 12/21/2011   Procedure: LEFT HEART CATHETERIZATION WITH Isabel Caprice;  Surgeon: Marykay Lex, MD;  Location: [facility] CATH LAB;  Service: Cardiovascular;  Laterality: N/A;  . NM MYOCAR PERF EJECTION FRACTION  09/22/2009, 07/03/2007   the post stress myocardial perfusion images show a normal pattern of perfusion is all regions. The post-stress ejection fraction is 68 %. no significant wall motion abnormalities noted. This is a low risk scan.  Marland Kitchen PERIPHERAL VASCULAR INTERVENTION  08/22/2017   Procedure: PERIPHERAL VASCULAR INTERVENTION;  Surgeon: Nada Libman, MD;  Location: [facility] INVASIVE CV LAB;  Service: Cardiovascular;;  Fem/Fem Graft  . REPLACEMENT TOTAL KNEE  05-2011  . UNILATERAL UPPER EXTREMEITY ANGIOGRAM Left 11/15/2011   Procedure: UNILATERAL UPPER EXTREMEITY ANGIOGRAM;  Surgeon: Runell Gess, MD;  Location: [facility] CATH LAB;  Service: Cardiovascular;  Laterality: Left;   Social History   Occupational History  . Not on file  Tobacco Use  . Smoking status: Former    Years: 3    Types: Cigarettes    Quit date: 11/27/1981    Years since quitting: 41.2  . Smokeless tobacco: Never  Vaping Use  . Vaping Use: Never used  Substance and Sexual Activity  . Alcohol use: Not Currently    Alcohol/week: 0.0 standard drinks of alcohol  . Drug use: No  . Sexual activity: Not Currently    Comment: 1st intercourse 23 yo-1 partner

## 2023-02-14 ENCOUNTER — Ambulatory Visit: Payer: Medicare HMO | Admitting: Orthopaedic Surgery

## 2023-02-21 ENCOUNTER — Telehealth: Payer: Self-pay | Admitting: *Deleted

## 2023-02-21 NOTE — Telephone Encounter (Signed)
Received Verification of Chronic Condition Form from [facility]. Patient has recently enrolled in a Chronic Condition Special Needs Plan and chronic Condition has to be verified by Provider.   Placed form in Deanna [name]'s folder to review and sign due to Shanda Bumps out of office.   Once signed to be faxed by to [facility] Fax:[phone]

## 2023-02-25 ENCOUNTER — Other Ambulatory Visit: Payer: Self-pay | Admitting: Nurse Practitioner

## 2023-03-06 ENCOUNTER — Ambulatory Visit: Payer: Medicare HMO | Admitting: Nurse Practitioner

## 2023-03-15 ENCOUNTER — Ambulatory Visit: Payer: Medicare HMO | Admitting: Psychiatry

## 2023-03-19 NOTE — Progress Notes (Signed)
Pharmacy Quality Measure Review  This patient is appearing on a report for being at risk of failing the adherence measure for diabetes (SGLT2 for CKD) medications this calendar year.   Medication: dapagliflozin 10 mg  Last fill date: 03/01/23 for 30 day supply  Rx will expire prior to PCP f/u on 05/25/22. There was also a 5ds fill on 03/19/23 - unsure from chart whether patient is receiving meds at a facility. Confirmed recent fills for rosuvastatin. Insurance report was not up to date. No action needed at this time.   Nils Pyle, PharmD PGY1 Pharmacy Resident

## 2023-04-23 DIAGNOSIS — I130 Hypertensive heart and chronic kidney disease with heart failure and stage 1 through stage 4 chronic kidney disease, or unspecified chronic kidney disease: Secondary | ICD-10-CM | POA: Diagnosis not present

## 2023-04-23 DIAGNOSIS — I6522 Occlusion and stenosis of left carotid artery: Secondary | ICD-10-CM | POA: Diagnosis not present

## 2023-04-23 DIAGNOSIS — M25512 Pain in left shoulder: Secondary | ICD-10-CM | POA: Diagnosis not present

## 2023-04-23 DIAGNOSIS — I4891 Unspecified atrial fibrillation: Secondary | ICD-10-CM | POA: Diagnosis not present

## 2023-04-23 DIAGNOSIS — I10 Essential (primary) hypertension: Secondary | ICD-10-CM | POA: Diagnosis not present

## 2023-04-23 DIAGNOSIS — M19011 Primary osteoarthritis, right shoulder: Secondary | ICD-10-CM | POA: Diagnosis not present

## 2023-04-23 DIAGNOSIS — I4892 Unspecified atrial flutter: Secondary | ICD-10-CM | POA: Diagnosis not present

## 2023-04-23 DIAGNOSIS — I6501 Occlusion and stenosis of right vertebral artery: Secondary | ICD-10-CM | POA: Diagnosis not present

## 2023-04-23 DIAGNOSIS — I739 Peripheral vascular disease, unspecified: Secondary | ICD-10-CM | POA: Diagnosis not present

## 2023-04-23 DIAGNOSIS — I82B11 Acute embolism and thrombosis of right subclavian vein: Secondary | ICD-10-CM | POA: Diagnosis not present

## 2023-04-23 DIAGNOSIS — I480 Paroxysmal atrial fibrillation: Secondary | ICD-10-CM | POA: Diagnosis not present

## 2023-04-23 DIAGNOSIS — Z951 Presence of aortocoronary bypass graft: Secondary | ICD-10-CM | POA: Diagnosis not present

## 2023-04-23 DIAGNOSIS — J811 Chronic pulmonary edema: Secondary | ICD-10-CM | POA: Diagnosis not present

## 2023-04-23 DIAGNOSIS — I2510 Atherosclerotic heart disease of native coronary artery without angina pectoris: Secondary | ICD-10-CM | POA: Diagnosis not present

## 2023-04-23 DIAGNOSIS — J810 Acute pulmonary edema: Secondary | ICD-10-CM | POA: Diagnosis not present

## 2023-04-23 DIAGNOSIS — R0689 Other abnormalities of breathing: Secondary | ICD-10-CM | POA: Diagnosis not present

## 2023-04-23 DIAGNOSIS — R0600 Dyspnea, unspecified: Secondary | ICD-10-CM | POA: Diagnosis not present

## 2023-04-23 DIAGNOSIS — I700 Atherosclerosis of aorta: Secondary | ICD-10-CM | POA: Diagnosis not present

## 2023-04-23 DIAGNOSIS — I255 Ischemic cardiomyopathy: Secondary | ICD-10-CM | POA: Diagnosis not present

## 2023-04-23 DIAGNOSIS — E860 Dehydration: Secondary | ICD-10-CM | POA: Diagnosis not present

## 2023-04-23 DIAGNOSIS — R918 Other nonspecific abnormal finding of lung field: Secondary | ICD-10-CM | POA: Diagnosis not present

## 2023-04-23 DIAGNOSIS — R079 Chest pain, unspecified: Secondary | ICD-10-CM | POA: Diagnosis not present

## 2023-04-23 DIAGNOSIS — I509 Heart failure, unspecified: Secondary | ICD-10-CM | POA: Diagnosis not present

## 2023-04-23 DIAGNOSIS — J90 Pleural effusion, not elsewhere classified: Secondary | ICD-10-CM | POA: Diagnosis not present

## 2023-04-23 DIAGNOSIS — R000 Tachycardia, unspecified: Secondary | ICD-10-CM | POA: Diagnosis not present

## 2023-04-23 DIAGNOSIS — Z0389 Encounter for observation for other suspected diseases and conditions ruled out: Secondary | ICD-10-CM | POA: Diagnosis not present

## 2023-04-23 DIAGNOSIS — R0902 Hypoxemia: Secondary | ICD-10-CM | POA: Diagnosis not present

## 2023-04-23 DIAGNOSIS — I214 Non-ST elevation (NSTEMI) myocardial infarction: Secondary | ICD-10-CM | POA: Diagnosis not present

## 2023-04-23 DIAGNOSIS — I5023 Acute on chronic systolic (congestive) heart failure: Secondary | ICD-10-CM | POA: Diagnosis not present

## 2023-04-23 DIAGNOSIS — R262 Difficulty in walking, not elsewhere classified: Secondary | ICD-10-CM | POA: Diagnosis not present

## 2023-04-23 DIAGNOSIS — I25810 Atherosclerosis of coronary artery bypass graft(s) without angina pectoris: Secondary | ICD-10-CM | POA: Diagnosis not present

## 2023-04-23 DIAGNOSIS — F03918 Unspecified dementia, unspecified severity, with other behavioral disturbance: Secondary | ICD-10-CM | POA: Diagnosis not present

## 2023-04-23 DIAGNOSIS — N183 Chronic kidney disease, stage 3 unspecified: Secondary | ICD-10-CM | POA: Diagnosis not present

## 2023-04-23 DIAGNOSIS — I708 Atherosclerosis of other arteries: Secondary | ICD-10-CM | POA: Diagnosis not present

## 2023-04-23 DIAGNOSIS — J9601 Acute respiratory failure with hypoxia: Secondary | ICD-10-CM | POA: Diagnosis not present

## 2023-04-24 DIAGNOSIS — F419 Anxiety disorder, unspecified: Secondary | ICD-10-CM | POA: Insufficient documentation

## 2023-04-26 NOTE — Progress Notes (Deleted)
Cardiology Office Note:    Date:  04/26/2023   ID:  Deanna [name], DOB 06-18-1941, MRN [id number]  PCP:  Sharon Seller, NP   [facility] Providers Cardiologist:  Nanetta Batty, MD { Click to update primary MD,subspecialty MD or APP then REFRESH:1}    Referring MD: Sharon Seller, NP   No chief complaint on file. ***  History of Present Illness:    Deanna [name] is a 82 y.o. female with a hx of CAD s/p PTCA of LAD 1994, CABG 1999, HTN, HLD, CKD IV, OSA, atrial fibrillation not on OAC due to prior bleed, PAD s/p fem-fembypass, L common carotid to subclavian bypass, dementia, and depression.   Heart cath 03/2020 in the setting of NSTEMI performed via left common femoral artery below prior fem-fem bypass anastomosis showed patent LIMA-LAD, SVG-diag, SVG-OM, but 100% occlusion in the SVG-RCA. Successful atherectomy and DES to 99% proximal LAD, but flow could not be restored. Echo at that time with LVEF 35-40%, grade 2 DD, mildly reduced RV, and mild MR. With GDMT, EF improved to 50-55% on echo 2022.     Admitted to [facility] 04/23/23 with flash pulmonary edema treated for CHF exacerbation in the setting of missing one dose of lasix. Heart cath 04/26/23 with patent SVG-large OM, LIMA-LAD atretic, occluded SVG-RCA, no intervention, study felt unchanged from cath 2021. Flash pulmonary edema felt related to HTN. Echo that admission with LVEF 45% wit moderate LVH grade 2 DD  She presents today for hospital follow up.      CAD s/p CABG 1999 PTCA -LAD 1994, PTCA-LCX 1995, CABG 1999 PCI to LAD 03/2020 - continue ASA and plavix   Chronic combined systolic and diastolic heart failure - improved form 35-40% to 50-55% on echo 2022 - echo 04/2023 at [facility] with EF 45% and grade 2 DD - continue BB, valsartan and lasix   Hypertension - managed in the context of CHF   PAF - brief episode in 2021, anticoagulation was not recommended, also with history of bleed - has maintained  sinus rhythm   PAD Right fem-fem bypass Left common carotid to subclavian bypass   CKD IV - following with nephrology       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   CKD (chronic kidney disease), stage IV (HCC)    Cognitive impairment    Colon polyps    Community acquired pneumonia    Depression    Diverticulosis    Dysuria    Encephalomalacia    GAD (generalized anxiety disorder)    GERD (gastroesophageal reflux disease)    Glaucoma    Hearing loss    Hemorrhoids    Hypertension    Hypothyroidism    Incontinence    Mixed hyperlipidemia    Myocardial infarction (HCC)    NSTEMI (non-ST elevated myocardial infarction) (HCC) 12/19/2011   OSA on CPAP    PAF (paroxysmal atrial fibrillation) (HCC)    Peripheral arterial disease (HCC)    Pneumonitis 04/26/2015   Pyelonephritis due to Escherichia coli 04/28/2015   Sepsis (HCC)    Spondylosis of lumbar region without myelopathy or radiculopathy    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HCC)  Urticaria    Vertigo, benign positional     Past Surgical History:  Procedure Laterality Date   ABDOMINAL AORTOGRAM W/LOWER EXTREMITY N/A 08/22/2017   Procedure: ABDOMINAL AORTOGRAM W/LOWER EXTREMITY;  Surgeon: Nada Libman, MD;  Location: [facility] INVASIVE CV LAB;  Service: Cardiovascular;  Laterality: N/A;   BILATERAL UPPER EXTREMITY ANGIOGRAM N/A 09/11/2012   Procedure: BILATERAL UPPER EXTREMITY ANGIOGRAM;  Surgeon: Nada Libman, MD;  Location: [facility] CATH LAB;  Service: Cardiovascular;  Laterality: N/A;   BIOPSY  03/25/2020   Procedure: BIOPSY;  Surgeon: Lemar Lofty., MD;  Location: [facility] ENDOSCOPY;  Service:  Gastroenterology;;   carotid duplex doppler Bilateral 09/03/2012, 11/03/2011   Evidence of 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 patent left common carotid- subclavian artery bypass with turbulent flow noted at the anastomosis with velocities of 295 cm/s   CAROTID-SUBCLAVIAN BYPASS GRAFT  12/15/2011   Procedure: BYPASS GRAFT CAROTID-SUBCLAVIAN;  Surgeon: Nada Libman, MD;  Location: [facility] OR;  Service: Vascular;  Laterality: Left;  Left Carotid subclavian bypass   CORONARY ANGIOPLASTY     CORONARY ARTERY BYPASS GRAFT     CORONARY ATHERECTOMY N/A 03/19/2020   Procedure: CORONARY ATHERECTOMY;  Surgeon: Corky Crafts, MD;  Location: [facility] INVASIVE CV LAB;  Service: Cardiovascular;  Laterality: N/A;   CORONARY STENT INTERVENTION N/A 03/19/2020   Procedure: CORONARY STENT INTERVENTION;  Surgeon: Corky Crafts, MD;  Location: [facility] INVASIVE CV LAB;  Service: Cardiovascular;  Laterality: N/A;   DOPPLER ECHOCARDIOGRAPHY  05/27/2010, 09/17/2008   Mild Proximal septal thickening is noted. Left ventricular systolic functions is normal ejection fraction =>55%. the aortic valve appears to be mildly sclerotic    ESOPHAGOGASTRODUODENOSCOPY (EGD) WITH PROPOFOL N/A 03/25/2020   Procedure: ESOPHAGOGASTRODUODENOSCOPY (EGD) WITH PROPOFOL;  Surgeon: Meridee Score Netty Starring., MD;  Location: [facility] ENDOSCOPY;  Service: Gastroenterology;  Laterality: N/A;   fem-fem bypass graft  1999   holter monitor  01/21/2008   The predominant rhythm was normal sinus rhythm. Minimum heartrate of 63 bpm at +01:00, maximum heartrate of 105 bpm at + 10:35; and the average heartrate of 75 bpm. Ventricular ectopic activity totaled 1270: Multifocal; 866-PVC's and 404-VEs              JOINT REPLACEMENT     Left knee   LEFT HEART CATH AND CORS/GRAFTS ANGIOGRAPHY N/A 03/19/2020   Procedure: LEFT HEART  CATH AND CORS/GRAFTS ANGIOGRAPHY;  Surgeon: Corky Crafts, MD;  Location: [facility] INVASIVE CV LAB;  Service: Cardiovascular;  Laterality: N/A;   LEFT HEART CATHETERIZATION WITH CORONARY/GRAFT ANGIOGRAM N/A 12/21/2011   Procedure: LEFT HEART CATHETERIZATION WITH Isabel Caprice;  Surgeon: Marykay Lex, MD;  Location: [facility] CATH LAB;  Service: Cardiovascular;  Laterality: N/A;   NM MYOCAR PERF EJECTION FRACTION  09/22/2009, 07/03/2007   the post stress myocardial perfusion images show a normal pattern of perfusion is all regions. The post-stress ejection fraction is 68 %. no significant wall motion abnormalities noted. This is a low risk scan.   PERIPHERAL VASCULAR INTERVENTION  08/22/2017   Procedure: PERIPHERAL VASCULAR INTERVENTION;  Surgeon: Nada Libman, MD;  Location: [facility] INVASIVE CV LAB;  Service: Cardiovascular;;  Fem/Fem Graft   REPLACEMENT TOTAL KNEE  05-2011   UNILATERAL UPPER EXTREMEITY ANGIOGRAM Left 11/15/2011   Procedure: UNILATERAL UPPER Salvadore Oxford;  Surgeon: Runell Gess, MD;  Location: [facility] CATH LAB;  Service: Cardiovascular;  Laterality: Left;    Current  Medications: No outpatient medications have been marked as taking for the 05/04/23 encounter (Appointment) with Marcelino Duster, PA.     Allergies:   Aricept [donepezil], Cymbalta [duloxetine hcl], Mobic [meloxicam], Namenda [memantine], Oxycontin [oxycodone], and Vicodin [hydrocodone-acetaminophen]   Social History   Socioeconomic History   Marital status: Divorced    Spouse name: Not on file   Number of children: Not on file   Years of education: Not on file   Highest education level: Not on file  Occupational History   Not on file  Tobacco Use   Smoking status: Former    Current packs/day: 0.00    Types: Cigarettes    Start date: 11/28/1978    Quit date: 11/27/1981    Years since quitting: 41.4   Smokeless tobacco: Never  Vaping Use   Vaping status: Never Used  Substance and Sexual  Activity   Alcohol use: Not Currently    Alcohol/week: 0.0 standard drinks of alcohol   Drug use: No   Sexual activity: Not Currently    Comment: 1st intercourse 23 yo-1 partner  Other Topics Concern   Not on file  Social History Narrative   Social History      Diet? Healthy but too many sweets      Do you drink/eat things with caffeine? Yes occasionally      Marital status?                    D                What year were you married?      Do you live in a house, apartment, assisted living, condo, trailer, etc.? condo      Is it one or more stories? 1      How many persons live in your home? 2      Do you have any pets in your home? (please list) 1 cat      Highest level of education completed? 1 year college      Current or past profession: housewife      Do you exercise?           no                           Type & how often?      Advanced Directives      Do you have a living will? no      Do you have a DNR form?             no                     If not, do you want to discuss one? yes      Do you have signed POA/HPOA for forms? no      Functional Status      Do you have difficulty bathing or dressing yourself? No- gets tired      Do you have difficulty preparing food or eating? No- eats frozen entrees or poor nutrition meals      Do you have difficulty managing your medications? yes      Do you have difficulty managing your finances? yes      Do you have difficulty affording your medications? No- funds running low   Social Determinants of Health   Financial Resource Strain: Not on file  Food Insecurity: No Food Insecurity (06/17/2022)  Hunger Vital Sign    Worried About Running Out of Food in the Last Year: Never true    Ran Out of Food in the Last Year: Never true  Transportation Needs: No Transportation Needs (04/24/2023)   Received from [facility] - Transportation    In the past 12 months, has lack of transportation  kept you from medical appointments or from getting medications?: No    Lack of Transportation (Non-Medical): No  Physical Activity: Not on file  Stress: Not on file  Social Connections: Not on file     Family History: The patient's ***family history includes Anxiety disorder in her daughter and sister; Anxiety disorder (age of onset: 38) in her sister; Cerebral palsy (age of onset: 32) in her sister; Colon cancer (age of onset: 94) in her brother; Congestive Heart Failure (age of onset: 31) in her sister; Dementia in her sister; Depression in her daughter; Diabetes in her father; Heart Problems in her sister; Heart Problems (age of onset: 52) in her sister; Heart attack in her mother; Heart attack (age of onset: 22) in her father; Heart attack (age of onset: 64) in her brother and sister; Heart attack (age of onset: 22) in her sister; Heart disease in her father and mother; Hyperlipidemia in her father; Hypertension in her daughter and father; Hypertension (age of onset: 55) in her sister; Irritable bowel syndrome in her daughter; Prostate cancer (age of onset: 59) in her brother; Stroke in her sister.  ROS:   Please see the history of present illness.    *** All other systems reviewed and are negative.  EKGs/Labs/Other Studies Reviewed:    The following studies were reviewed today: ***      Recent Labs: 02/02/2023: ALT 10; BUN 22; Creat 1.38; Hemoglobin 14.5; Platelets 204; Potassium 3.7; Sodium 141; TSH 9.97  Recent Lipid Panel    Component Value Date/Time   CHOL 110 05/10/2021 0142   TRIG 160 (H) 05/10/2021 0142   HDL 34 (L) 05/10/2021 0142   CHOLHDL 3.2 05/10/2021 0142   VLDL 32 05/10/2021 0142   LDLCALC 44 05/10/2021 0142   LDLCALC 70 09/02/2020 1440     Risk Assessment/Calculations:   {Does this patient have ATRIAL FIBRILLATION?:[phone]}  No BP recorded.  {Refresh Note OR Click here to enter BP  :1}***         Physical Exam:    VS:  There were no vitals taken for  this visit.    Wt Readings from Last 3 Encounters:  02/02/23 181 lb (82.1 kg)  06/19/22 192 lb 7.4 oz (87.3 kg)  04/14/22 185 lb 12.8 oz (84.3 kg)     GEN: *** Well nourished, well developed in no acute distress HEENT: Normal NECK: No JVD; No carotid bruits LYMPHATICS: No lymphadenopathy CARDIAC: ***RRR, no murmurs, rubs, gallops RESPIRATORY:  Clear to auscultation without rales, wheezing or rhonchi  ABDOMEN: Soft, non-tender, non-distended MUSCULOSKELETAL:  No edema; No deformity  SKIN: Warm and dry NEUROLOGIC:  Alert and oriented x 3 PSYCHIATRIC:  Normal affect   ASSESSMENT:    No diagnosis found. PLAN:    In order of problems listed above:  ***      {Are you ordering a CV Procedure (e.g. stress test, cath, DCCV, TEE, etc)?   Press F2        :[id number]}    Medication Adjustments/Labs and Tests Ordered: Current medicines are reviewed at length with the patient today.  Concerns regarding medicines are outlined above.  No orders of the defined types were placed in this encounter.  No orders of the defined types were placed in this encounter.   There are no Patient Instructions on file for this visit.   Signed, Marcelino Duster, [place]  04/26/2023 3:27 PM    [facility]

## 2023-05-04 ENCOUNTER — Ambulatory Visit: Payer: Medicare HMO | Attending: Physician Assistant | Admitting: Physician Assistant

## 2023-05-09 ENCOUNTER — Telehealth: Payer: Self-pay

## 2023-05-09 DIAGNOSIS — I130 Hypertensive heart and chronic kidney disease with heart failure and stage 1 through stage 4 chronic kidney disease, or unspecified chronic kidney disease: Secondary | ICD-10-CM | POA: Diagnosis not present

## 2023-05-09 DIAGNOSIS — D696 Thrombocytopenia, unspecified: Secondary | ICD-10-CM | POA: Diagnosis not present

## 2023-05-09 DIAGNOSIS — N183 Chronic kidney disease, stage 3 unspecified: Secondary | ICD-10-CM | POA: Diagnosis not present

## 2023-05-09 DIAGNOSIS — I5022 Chronic systolic (congestive) heart failure: Secondary | ICD-10-CM | POA: Diagnosis not present

## 2023-05-09 DIAGNOSIS — I739 Peripheral vascular disease, unspecified: Secondary | ICD-10-CM | POA: Diagnosis not present

## 2023-05-09 DIAGNOSIS — I480 Paroxysmal atrial fibrillation: Secondary | ICD-10-CM | POA: Diagnosis not present

## 2023-05-09 DIAGNOSIS — N179 Acute kidney failure, unspecified: Secondary | ICD-10-CM | POA: Diagnosis not present

## 2023-05-09 DIAGNOSIS — I214 Non-ST elevation (NSTEMI) myocardial infarction: Secondary | ICD-10-CM | POA: Diagnosis not present

## 2023-05-09 DIAGNOSIS — I2510 Atherosclerotic heart disease of native coronary artery without angina pectoris: Secondary | ICD-10-CM | POA: Diagnosis not present

## 2023-05-09 NOTE — Telephone Encounter (Signed)
Verbal order request for   1.) Physical therapy 1x 1 week, 2 x 4 week, then 1 x 4 weeks  2.) Home health nursing evaluation  3.) Medical social worker evaluation  Per [facility] standing order, verbal order given. Message will be sent to patient's provider as a FYI.

## 2023-05-16 DIAGNOSIS — I130 Hypertensive heart and chronic kidney disease with heart failure and stage 1 through stage 4 chronic kidney disease, or unspecified chronic kidney disease: Secondary | ICD-10-CM | POA: Diagnosis not present

## 2023-05-16 DIAGNOSIS — D696 Thrombocytopenia, unspecified: Secondary | ICD-10-CM | POA: Diagnosis not present

## 2023-05-16 DIAGNOSIS — I739 Peripheral vascular disease, unspecified: Secondary | ICD-10-CM | POA: Diagnosis not present

## 2023-05-16 DIAGNOSIS — I480 Paroxysmal atrial fibrillation: Secondary | ICD-10-CM | POA: Diagnosis not present

## 2023-05-16 DIAGNOSIS — I2510 Atherosclerotic heart disease of native coronary artery without angina pectoris: Secondary | ICD-10-CM | POA: Diagnosis not present

## 2023-05-16 DIAGNOSIS — I5022 Chronic systolic (congestive) heart failure: Secondary | ICD-10-CM | POA: Diagnosis not present

## 2023-05-16 DIAGNOSIS — N183 Chronic kidney disease, stage 3 unspecified: Secondary | ICD-10-CM | POA: Diagnosis not present

## 2023-05-16 DIAGNOSIS — I214 Non-ST elevation (NSTEMI) myocardial infarction: Secondary | ICD-10-CM | POA: Diagnosis not present

## 2023-05-16 DIAGNOSIS — N179 Acute kidney failure, unspecified: Secondary | ICD-10-CM | POA: Diagnosis not present

## 2023-05-18 ENCOUNTER — Telehealth: Payer: Medicare HMO

## 2023-05-18 NOTE — Telephone Encounter (Signed)
Home health nurse called requesting verbal orders for nursing 1 x 4 weeks, then every other week for 4 weeks.  Per [facility] standing order, verbal order given. Message will be sent to patient's provider as a FYI.

## 2023-05-23 DIAGNOSIS — D696 Thrombocytopenia, unspecified: Secondary | ICD-10-CM | POA: Diagnosis not present

## 2023-05-23 DIAGNOSIS — I739 Peripheral vascular disease, unspecified: Secondary | ICD-10-CM | POA: Diagnosis not present

## 2023-05-23 DIAGNOSIS — I480 Paroxysmal atrial fibrillation: Secondary | ICD-10-CM | POA: Diagnosis not present

## 2023-05-23 DIAGNOSIS — I5022 Chronic systolic (congestive) heart failure: Secondary | ICD-10-CM | POA: Diagnosis not present

## 2023-05-23 DIAGNOSIS — I214 Non-ST elevation (NSTEMI) myocardial infarction: Secondary | ICD-10-CM | POA: Diagnosis not present

## 2023-05-23 DIAGNOSIS — N183 Chronic kidney disease, stage 3 unspecified: Secondary | ICD-10-CM | POA: Diagnosis not present

## 2023-05-23 DIAGNOSIS — N179 Acute kidney failure, unspecified: Secondary | ICD-10-CM | POA: Diagnosis not present

## 2023-05-23 DIAGNOSIS — I2510 Atherosclerotic heart disease of native coronary artery without angina pectoris: Secondary | ICD-10-CM | POA: Diagnosis not present

## 2023-05-23 DIAGNOSIS — I130 Hypertensive heart and chronic kidney disease with heart failure and stage 1 through stage 4 chronic kidney disease, or unspecified chronic kidney disease: Secondary | ICD-10-CM | POA: Diagnosis not present

## 2023-05-26 ENCOUNTER — Telehealth: Payer: Self-pay

## 2023-05-26 DIAGNOSIS — I214 Non-ST elevation (NSTEMI) myocardial infarction: Secondary | ICD-10-CM | POA: Diagnosis not present

## 2023-05-26 DIAGNOSIS — I5022 Chronic systolic (congestive) heart failure: Secondary | ICD-10-CM | POA: Diagnosis not present

## 2023-05-26 DIAGNOSIS — D696 Thrombocytopenia, unspecified: Secondary | ICD-10-CM | POA: Diagnosis not present

## 2023-05-26 DIAGNOSIS — I130 Hypertensive heart and chronic kidney disease with heart failure and stage 1 through stage 4 chronic kidney disease, or unspecified chronic kidney disease: Secondary | ICD-10-CM | POA: Diagnosis not present

## 2023-05-26 DIAGNOSIS — I2510 Atherosclerotic heart disease of native coronary artery without angina pectoris: Secondary | ICD-10-CM | POA: Diagnosis not present

## 2023-05-26 DIAGNOSIS — N179 Acute kidney failure, unspecified: Secondary | ICD-10-CM | POA: Diagnosis not present

## 2023-05-26 DIAGNOSIS — I739 Peripheral vascular disease, unspecified: Secondary | ICD-10-CM | POA: Diagnosis not present

## 2023-05-26 DIAGNOSIS — N183 Chronic kidney disease, stage 3 unspecified: Secondary | ICD-10-CM | POA: Diagnosis not present

## 2023-05-26 DIAGNOSIS — I480 Paroxysmal atrial fibrillation: Secondary | ICD-10-CM | POA: Diagnosis not present

## 2023-05-26 NOTE — Telephone Encounter (Signed)
Physical therapist with [facility] called to report a missed visit.

## 2023-06-05 ENCOUNTER — Encounter: Payer: Medicare HMO | Admitting: Nurse Practitioner

## 2023-06-07 DIAGNOSIS — I5022 Chronic systolic (congestive) heart failure: Secondary | ICD-10-CM | POA: Diagnosis not present

## 2023-06-07 DIAGNOSIS — I214 Non-ST elevation (NSTEMI) myocardial infarction: Secondary | ICD-10-CM | POA: Diagnosis not present

## 2023-06-07 DIAGNOSIS — I480 Paroxysmal atrial fibrillation: Secondary | ICD-10-CM | POA: Diagnosis not present

## 2023-06-07 DIAGNOSIS — N179 Acute kidney failure, unspecified: Secondary | ICD-10-CM | POA: Diagnosis not present

## 2023-06-07 DIAGNOSIS — N183 Chronic kidney disease, stage 3 unspecified: Secondary | ICD-10-CM | POA: Diagnosis not present

## 2023-06-07 DIAGNOSIS — I739 Peripheral vascular disease, unspecified: Secondary | ICD-10-CM | POA: Diagnosis not present

## 2023-06-07 DIAGNOSIS — D696 Thrombocytopenia, unspecified: Secondary | ICD-10-CM | POA: Diagnosis not present

## 2023-06-07 DIAGNOSIS — I2510 Atherosclerotic heart disease of native coronary artery without angina pectoris: Secondary | ICD-10-CM | POA: Diagnosis not present

## 2023-06-07 DIAGNOSIS — I130 Hypertensive heart and chronic kidney disease with heart failure and stage 1 through stage 4 chronic kidney disease, or unspecified chronic kidney disease: Secondary | ICD-10-CM | POA: Diagnosis not present

## 2023-06-08 DIAGNOSIS — I214 Non-ST elevation (NSTEMI) myocardial infarction: Secondary | ICD-10-CM | POA: Diagnosis not present

## 2023-06-08 DIAGNOSIS — N183 Chronic kidney disease, stage 3 unspecified: Secondary | ICD-10-CM | POA: Diagnosis not present

## 2023-06-08 DIAGNOSIS — I480 Paroxysmal atrial fibrillation: Secondary | ICD-10-CM | POA: Diagnosis not present

## 2023-06-08 DIAGNOSIS — I5022 Chronic systolic (congestive) heart failure: Secondary | ICD-10-CM | POA: Diagnosis not present

## 2023-06-08 DIAGNOSIS — N179 Acute kidney failure, unspecified: Secondary | ICD-10-CM | POA: Diagnosis not present

## 2023-06-08 DIAGNOSIS — I739 Peripheral vascular disease, unspecified: Secondary | ICD-10-CM | POA: Diagnosis not present

## 2023-06-08 DIAGNOSIS — I130 Hypertensive heart and chronic kidney disease with heart failure and stage 1 through stage 4 chronic kidney disease, or unspecified chronic kidney disease: Secondary | ICD-10-CM | POA: Diagnosis not present

## 2023-06-08 DIAGNOSIS — D696 Thrombocytopenia, unspecified: Secondary | ICD-10-CM | POA: Diagnosis not present

## 2023-06-08 DIAGNOSIS — I2510 Atherosclerotic heart disease of native coronary artery without angina pectoris: Secondary | ICD-10-CM | POA: Diagnosis not present

## 2023-06-09 DIAGNOSIS — I739 Peripheral vascular disease, unspecified: Secondary | ICD-10-CM | POA: Diagnosis not present

## 2023-06-09 DIAGNOSIS — I5022 Chronic systolic (congestive) heart failure: Secondary | ICD-10-CM | POA: Diagnosis not present

## 2023-06-09 DIAGNOSIS — I214 Non-ST elevation (NSTEMI) myocardial infarction: Secondary | ICD-10-CM | POA: Diagnosis not present

## 2023-06-09 DIAGNOSIS — I2510 Atherosclerotic heart disease of native coronary artery without angina pectoris: Secondary | ICD-10-CM | POA: Diagnosis not present

## 2023-06-09 DIAGNOSIS — N179 Acute kidney failure, unspecified: Secondary | ICD-10-CM | POA: Diagnosis not present

## 2023-06-09 DIAGNOSIS — I130 Hypertensive heart and chronic kidney disease with heart failure and stage 1 through stage 4 chronic kidney disease, or unspecified chronic kidney disease: Secondary | ICD-10-CM | POA: Diagnosis not present

## 2023-06-09 DIAGNOSIS — N183 Chronic kidney disease, stage 3 unspecified: Secondary | ICD-10-CM | POA: Diagnosis not present

## 2023-06-09 DIAGNOSIS — I480 Paroxysmal atrial fibrillation: Secondary | ICD-10-CM | POA: Diagnosis not present

## 2023-06-09 DIAGNOSIS — D696 Thrombocytopenia, unspecified: Secondary | ICD-10-CM | POA: Diagnosis not present

## 2023-06-14 DIAGNOSIS — I5022 Chronic systolic (congestive) heart failure: Secondary | ICD-10-CM | POA: Diagnosis not present

## 2023-06-14 DIAGNOSIS — I739 Peripheral vascular disease, unspecified: Secondary | ICD-10-CM | POA: Diagnosis not present

## 2023-06-14 DIAGNOSIS — N179 Acute kidney failure, unspecified: Secondary | ICD-10-CM | POA: Diagnosis not present

## 2023-06-14 DIAGNOSIS — D696 Thrombocytopenia, unspecified: Secondary | ICD-10-CM | POA: Diagnosis not present

## 2023-06-14 DIAGNOSIS — N183 Chronic kidney disease, stage 3 unspecified: Secondary | ICD-10-CM | POA: Diagnosis not present

## 2023-06-14 DIAGNOSIS — I2510 Atherosclerotic heart disease of native coronary artery without angina pectoris: Secondary | ICD-10-CM | POA: Diagnosis not present

## 2023-06-14 DIAGNOSIS — I130 Hypertensive heart and chronic kidney disease with heart failure and stage 1 through stage 4 chronic kidney disease, or unspecified chronic kidney disease: Secondary | ICD-10-CM | POA: Diagnosis not present

## 2023-06-14 DIAGNOSIS — I480 Paroxysmal atrial fibrillation: Secondary | ICD-10-CM | POA: Diagnosis not present

## 2023-06-14 DIAGNOSIS — I214 Non-ST elevation (NSTEMI) myocardial infarction: Secondary | ICD-10-CM | POA: Diagnosis not present

## 2023-06-16 ENCOUNTER — Encounter: Payer: Self-pay | Admitting: Nurse Practitioner

## 2023-06-16 ENCOUNTER — Ambulatory Visit (INDEPENDENT_AMBULATORY_CARE_PROVIDER_SITE_OTHER): Payer: Medicare HMO | Admitting: Nurse Practitioner

## 2023-06-16 VITALS — BP 128/76 | HR 91 | Temp 97.3°F | Ht 61.0 in | Wt 171.0 lb

## 2023-06-16 DIAGNOSIS — F411 Generalized anxiety disorder: Secondary | ICD-10-CM | POA: Diagnosis not present

## 2023-06-16 DIAGNOSIS — Z23 Encounter for immunization: Secondary | ICD-10-CM | POA: Diagnosis not present

## 2023-06-16 DIAGNOSIS — F39 Unspecified mood [affective] disorder: Secondary | ICD-10-CM

## 2023-06-16 DIAGNOSIS — G8929 Other chronic pain: Secondary | ICD-10-CM

## 2023-06-16 DIAGNOSIS — I10 Essential (primary) hypertension: Secondary | ICD-10-CM | POA: Diagnosis not present

## 2023-06-16 DIAGNOSIS — M25512 Pain in left shoulder: Secondary | ICD-10-CM

## 2023-06-16 DIAGNOSIS — E039 Hypothyroidism, unspecified: Secondary | ICD-10-CM | POA: Diagnosis not present

## 2023-06-16 DIAGNOSIS — I509 Heart failure, unspecified: Secondary | ICD-10-CM | POA: Diagnosis not present

## 2023-06-16 DIAGNOSIS — I480 Paroxysmal atrial fibrillation: Secondary | ICD-10-CM

## 2023-06-16 DIAGNOSIS — I2510 Atherosclerotic heart disease of native coronary artery without angina pectoris: Secondary | ICD-10-CM

## 2023-06-16 NOTE — Progress Notes (Signed)
Careteam: Patient Care Team: Sharon Seller, NP as PCP - General (Geriatric Medicine) Runell Gess, MD as PCP - Cardiology (Cardiology) Hilarie Fredrickson, MD as Consulting Physician (Gastroenterology) Gean Birchwood, MD as Consulting Physician (Orthopedic Surgery) Jethro Bolus, MD as Consulting Physician (Ophthalmology) Alfredo Martinez, MD as Consulting Physician (Urology) Nada Libman, MD as Consulting Physician (Vascular Surgery) Van Clines, MD as Consulting Physician (Neurology) Lucille Passy, MD as Referring Physician (Ophthalmology) Corie Chiquito, PMHNP as Nurse Practitioner (Psychiatry) Van Clines, MD as Consulting Physician (Neurology)  PLACE OF SERVICE:  [facility]  Advanced Directive information    Allergies  Allergen Reactions   Aricept [Donepezil] Other (See Comments)    Altered mood Aggression     Cymbalta [Duloxetine Hcl] Other (See Comments)    Increased confusion and memory concerns    Mobic [Meloxicam] Other (See Comments)    Heart failure   Namenda [Memantine] Other (See Comments)    Severe aggression   Oxycontin [Oxycodone] Other (See Comments)    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 Patient with left arm pain. Mood changes per daughter.      HPI: Patient is a 82 y.o. female for routine follow up.  She was hospitalized for 8 days at [facility]- for acute CHF in September. Currently doing home health pt/ot for strength. Not using walker as much at home.  No LE edema, chest pains or shortness of breath   Daughter reports when she got out of the hospital did not have her lexapro and daughter just figured it out and she has been back on medication for 2 days. Nerves have been shot since she has been off medication   She was having screaming nightmares  She sundowns and it is very intense- using xanax  Xanax is prescribed by psychiatry   Reports she is in pain all the time to left shoulder  She has not made appt for eye doctor and can not get refills on her eye drops until she sees the ophthalmology- daughter reports she keeps forgetting to call and make her an appt.   During hospitalization daughter reports she was taken off farxiga due to yeast  She was started on zetia 10 mg daily   She has had increase pain to left shoulder-  She was seen in June and took prednisone- daughter reports this did not help.  Daughter has been giving her tylenol twice daily   She is losing weight- not herself. Daughter thinks this is all to do with being off escitalopram   Review of Systems:  Review of Systems  Constitutional:  Negative for chills, fever and weight loss.  HENT:  Negative for tinnitus.   Respiratory:  Negative for cough, sputum production and shortness of breath.   Cardiovascular:  Negative for chest pain, palpitations and leg swelling.  Gastrointestinal:  Negative for abdominal pain, constipation, diarrhea and heartburn.  Genitourinary:  Negative for dysuria, frequency and urgency.  Musculoskeletal:  Positive for joint pain and myalgias. Negative for back pain and falls.  Skin: Negative.   Neurological:  Negative for dizziness and headaches.  Psychiatric/Behavioral:  Positive for memory loss. Negative for depression. The patient is nervous/anxious. The patient does not have insomnia.     Past  Medical History:  Diagnosis Date   Anxiety    Bacteremia, escherichia coli 04/27/2015   Brachial-basilar insufficiency syndrome    CAD (coronary artery disease)    Celiac artery stenosis (HCC)    Chronic bilateral low back pain without sciatica    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   CKD (chronic kidney disease), stage IV (HCC)     Cognitive impairment    Colon polyps    Community acquired pneumonia    Depression    Diverticulosis    Dysuria    Encephalomalacia    GAD (generalized anxiety disorder)    GERD (gastroesophageal reflux disease)    Glaucoma    Hearing loss    Hemorrhoids    Hypertension    Hypothyroidism    Incontinence    Mixed hyperlipidemia    Myocardial infarction (HCC)    NSTEMI (non-ST elevated myocardial infarction) (HCC) 12/19/2011   OSA on CPAP    PAF (paroxysmal atrial fibrillation) (HCC)    Peripheral arterial disease (HCC)    Pneumonitis 04/26/2015   Pyelonephritis due to Escherichia coli 04/28/2015   Sepsis (HCC)    Spondylosis of lumbar region without myelopathy or radiculopathy    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HCC)    Urticaria    Vertigo, benign positional    Past Surgical History:  Procedure Laterality Date   ABDOMINAL AORTOGRAM W/LOWER EXTREMITY N/A 08/22/2017   Procedure: ABDOMINAL AORTOGRAM W/LOWER EXTREMITY;  Surgeon: Nada Libman, MD;  Location: [facility] INVASIVE CV LAB;  Service: Cardiovascular;  Laterality: N/A;   BILATERAL UPPER EXTREMITY ANGIOGRAM N/A 09/11/2012   Procedure: BILATERAL UPPER EXTREMITY ANGIOGRAM;  Surgeon: Nada Libman, MD;  Location: [facility] CATH LAB;  Service: Cardiovascular;  Laterality: N/A;   BIOPSY  03/25/2020   Procedure: BIOPSY;  Surgeon: Lemar Lofty., MD;  Location: [facility] ENDOSCOPY;  Service: Gastroenterology;;   carotid duplex doppler Bilateral 09/03/2012, 11/03/2011   Evidence of 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 patent left common carotid- subclavian artery bypass with turbulent flow noted at the anastomosis with velocities of 295 cm/s   CAROTID-SUBCLAVIAN BYPASS GRAFT  12/15/2011   Procedure: BYPASS GRAFT CAROTID-SUBCLAVIAN;  Surgeon: Nada Libman, MD;  Location: [facility] OR;  Service: Vascular;   Laterality: Left;  Left Carotid subclavian bypass   CORONARY ANGIOPLASTY     CORONARY ARTERY BYPASS GRAFT     CORONARY ATHERECTOMY N/A 03/19/2020   Procedure: CORONARY ATHERECTOMY;  Surgeon: Corky Crafts, MD;  Location: [facility] INVASIVE CV LAB;  Service: Cardiovascular;  Laterality: N/A;   CORONARY STENT INTERVENTION N/A 03/19/2020   Procedure: CORONARY STENT INTERVENTION;  Surgeon: Corky Crafts, MD;  Location: [facility] INVASIVE CV LAB;  Service: Cardiovascular;  Laterality: N/A;   DOPPLER ECHOCARDIOGRAPHY  05/27/2010, 09/17/2008   Mild Proximal septal thickening is noted. Left ventricular systolic functions is normal ejection fraction =>55%. the aortic valve appears to be mildly sclerotic    ESOPHAGOGASTRODUODENOSCOPY (EGD) WITH PROPOFOL N/A 03/25/2020   Procedure: ESOPHAGOGASTRODUODENOSCOPY (EGD) WITH PROPOFOL;  Surgeon: Meridee Score Netty Starring., MD;  Location: [facility] ENDOSCOPY;  Service: Gastroenterology;  Laterality: N/A;   fem-fem bypass graft  1999   holter monitor  01/21/2008   The predominant rhythm was normal sinus rhythm. Minimum heartrate of 63 bpm at +01:00, maximum heartrate of 105 bpm at + 10:35; and the average heartrate of 75 bpm. Ventricular ectopic activity totaled 1270: Multifocal;  866-PVC's and 404-VEs              JOINT REPLACEMENT     Left knee   LEFT HEART CATH AND CORS/GRAFTS ANGIOGRAPHY N/A 03/19/2020   Procedure: LEFT HEART CATH AND CORS/GRAFTS ANGIOGRAPHY;  Surgeon: Corky Crafts, MD;  Location[facility] INVASIVE CV LAB;  Service: Cardiovascular;  Laterality: N/A;   LEFT HEART CATHETERIZATION WITH CORONARY/GRAFT ANGIOGRAM N/A 12/21/2011   Procedure: LEFT HEART CATHETERIZATION WITH Isabel Caprice;  Surgeon: Marykay Lex, MD;  Location: [facility] CATH LAB;  Service: Cardiovascular;  Laterality: N/A;   NM MYOCAR PERF EJECTION FRACTION  09/22/2009, 07/03/2007   the post stress myocardial perfusion images show a normal pattern of perfusion is all regions. The post-stress  ejection fraction is 68 %. no significant wall motion abnormalities noted. This is a low risk scan.   PERIPHERAL VASCULAR INTERVENTION  08/22/2017   Procedure: PERIPHERAL VASCULAR INTERVENTION;  Surgeon: Nada Libman, MD;  Location: [facility] INVASIVE CV LAB;  Service: Cardiovascular;;  Fem/Fem Graft   REPLACEMENT TOTAL KNEE  05-2011   UNILATERAL UPPER EXTREMEITY ANGIOGRAM Left 11/15/2011   Procedure: UNILATERAL UPPER Salvadore Oxford;  Surgeon: Runell Gess, MD;  Location: [facility] CATH LAB;  Service: Cardiovascular;  Laterality: Left;   Social History:   reports that she quit smoking about 41 years ago. Her smoking use included cigarettes. She started smoking about 44 years ago. She has never used smokeless tobacco. She reports that she does not currently use alcohol. She reports that she does not use drugs.  Family History  Problem Relation Age of Onset   Heart attack Mother    Heart disease Mother        before age 52   Diabetes Father    Heart disease Father    Hypertension Father    Hyperlipidemia Father    Heart attack Father 56   Heart attack Brother 102   Cerebral palsy Sister 68   Congestive Heart Failure Sister 23   Heart attack Sister 64   Hypertension Sister 97   Dementia Sister    Anxiety disorder Sister    Anxiety disorder Sister 10   Heart Problems Sister    Stroke Sister    Heart Problems Sister 41   Heart attack Sister 40   Colon cancer Brother 26   Prostate cancer Brother 24   Hypertension Daughter    Irritable bowel syndrome Daughter    Depression Daughter    Anxiety disorder Daughter     Medications: Patient's Medications  New Prescriptions   No medications on file  Previous Medications   ACETAMINOPHEN (TYLENOL) 325 MG TABLET    Take 325 mg by mouth 3 (three) times daily as needed for mild pain or headache.   ALPRAZOLAM (XANAX) 0.5 MG TABLET    Take 1 tablet (0.5 mg total) by mouth daily as needed for anxiety.   ASPIRIN EC 81 MG TABLET    Take 81 mg by  mouth at bedtime.   BUSPIRONE (BUSPAR) 15 MG TABLET    Take 1 tablet (15 mg total) by mouth 2 (two) times daily.   CHOLECALCIFEROL (VITAMIN D3) 125 MCG (5000 UT) CAPS    Take 5,000 Units by mouth at bedtime.   CLOPIDOGREL (PLAVIX) 75 MG TABLET    TAKE 1 TABLET(75 MG) BY MOUTH DAILY   CYANOCOBALAMIN (VITAMIN B-12 PO)    Take 1 tablet by mouth in the morning.   DAPAGLIFLOZIN PROPANEDIOL (FARXIGA) 10 MG TABS TABLET    TAKE  1 TABLET(10 MG) BY MOUTH DAILY   DIVALPROEX (DEPAKOTE ER) 250 MG 24 HR TABLET    Take 1 tablet (250 mg total) by mouth at bedtime.   DORZOLAMIDE (TRUSOPT) 2 % OPHTHALMIC SOLUTION    Place 1 drop into both eyes 2 (two) times daily.   ESCITALOPRAM (LEXAPRO) 20 MG TABLET    Take 1 tablet (20 mg total) by mouth daily.   EZETIMIBE (ZETIA) 10 MG TABLET    Take 10 mg by mouth daily.   FUROSEMIDE (LASIX) 40 MG TABLET    TAKE 1 TABLET(40 MG) BY MOUTH DAILY   GABAPENTIN (NEURONTIN) 100 MG CAPSULE    TAKE 2 CAPSULES(200 MG) BY MOUTH DAILY   KETOCONAZOLE (NIZORAL) 2 % SHAMPOO    APPLY TOPICALLY 2 TIMES A WEEK.   LATANOPROST (XALATAN) 0.005 % OPHTHALMIC SOLUTION    Place 1 drop into both eyes at bedtime.   LEVOTHYROXINE (SYNTHROID) 175 MCG TABLET    TAKE 1 TABLET BY MOUTH DAILY AT THE SAME TIME AND SEPARATE FROM ALL OTHER MEDICATIONS   MECLIZINE (ANTIVERT) 25 MG TABLET    Take 1 tablet (25 mg total) by mouth as needed for dizziness.   METOPROLOL SUCCINATE (TOPROL-XL) 25 MG 24 HR TABLET    Take 25 mg by mouth daily.   METOPROLOL TARTRATE (LOPRESSOR) 25 MG TABLET    TAKE 1/2 TABLET(12.5 MG) BY MOUTH TWICE DAILY   MIRABEGRON ER (MYRBETRIQ) 50 MG TB24 TABLET    Take 50 mg by mouth in the morning.   NITROGLYCERIN (NITROSTAT) 0.4 MG SL TABLET    Place 1 tablet (0.4 mg total) under the tongue every 5 (five) minutes x 3 doses as needed for chest pain.   PANTOPRAZOLE (PROTONIX) 40 MG TABLET    TAKE 1 TABLET EVERY DAY   ROSUVASTATIN (CRESTOR) 40 MG TABLET    TAKE 1 TABLET(40 MG) BY MOUTH DAILY   Modified Medications   No medications on file  Discontinued Medications   KETOCONAZOLE, TOPICAL, (NIZORAL) 1 % SHAM    Apply 1 application  topically daily as needed (rash on scalp).    Physical Exam:  Vitals:   06/16/23 1457  BP: 128/76  Pulse: 91  Temp: (!) 97.3 F (36.3 C)  TempSrc: Temporal  SpO2: 93%  Weight: 171 lb (77.6 kg)  Height: 5\' 1"  (1.549 m)   Body mass index is 32.31 kg/m. Wt Readings from Last 3 Encounters:  06/16/23 171 lb (77.6 kg)  02/02/23 181 lb (82.1 kg)  06/19/22 192 lb 7.4 oz (87.3 kg)    Physical Exam Constitutional:      General: She is not in acute distress.    Appearance: She is well-developed. She is not diaphoretic.  HENT:     Head: Normocephalic and atraumatic.     Mouth/Throat:     Pharynx: No oropharyngeal exudate.  Eyes:     Conjunctiva/sclera: Conjunctivae normal.     Pupils: Pupils are equal, round, and reactive to light.  Cardiovascular:     Rate and Rhythm: Normal rate and regular rhythm.     Heart sounds: Normal heart sounds.  Pulmonary:     Effort: Pulmonary effort is normal.     Breath sounds: Normal breath sounds.  Abdominal:     General: Bowel sounds are normal.     Palpations: Abdomen is soft.  Musculoskeletal:     Right shoulder: Tenderness present. Decreased range of motion.     Left shoulder: Decreased range of motion.  Cervical back: Normal range of motion and neck supple.     Right lower leg: No edema.     Left lower leg: No edema.  Skin:    General: Skin is warm and dry.  Neurological:     Mental Status: She is alert.  Psychiatric:        Mood and Affect: Mood normal.     Labs reviewed: Basic Metabolic Panel: Recent Labs    06/18/22 0308 06/19/22 0354 07/14/22 0000 02/02/23 1049  NA 138 141 143 141  K 3.6 4.2 3.4* 3.7  CL 103 103 101 99  CO2 28 25 24* 28  GLUCOSE 147* 132*  --  98  BUN 11 20 14 22   CREATININE 1.41* 1.34* 1.4* 1.38*  CALCIUM 8.9 9.2 9.4 9.4  TSH  --   --   --  9.97*    Liver Function Tests: Recent Labs    06/17/22 0256 06/18/22 0308 06/19/22 0354 07/14/22 0000 02/02/23 1049  AST 21 15 19   --  11  ALT 20 16 15   --  10  ALKPHOS 71 57 58  --   --   BILITOT 1.2 1.0 0.5  --  1.0  PROT 5.6* 5.7* 5.5*  --  6.4  ALBUMIN 3.2* 3.0* 2.9* 4.3  --    No results for input(s): "LIPASE", "AMYLASE" in the last 8760 hours. No results for input(s): "AMMONIA" in the last 8760 hours. CBC: Recent Labs    06/17/22 0256 06/18/22 0308 06/19/22 0354 07/14/22 0000 02/02/23 1049  WBC 7.5 5.1 5.0 8.0 9.5  NEUTROABS 6.1  --   --  4.80 5,947  HGB 13.0 12.5 12.4 14.2 14.5  HCT 39.0 37.8 37.1 41 43.3  MCV 88.8 88.3 87.9  --  86.1  PLT 155 148* 149* 133* 204   Lipid Panel: No results for input(s): "CHOL", "HDL", "LDLCALC", "TRIG", "CHOLHDL", "LDLDIRECT" in the last 8760 hours. TSH: Recent Labs    02/02/23 1049  TSH 9.97*   A1C: Lab Results  Component Value Date   HGBA1C 5.8 (H) 03/18/2020     Assessment/Plan 1. Chronic pain in left shoulder Limited ROM to left shoulder increase pain with movement.  Had prednisone in June without significant improvement  - AMB referral to orthopedics for further evaluation and treatment   2. Essential hypertension -Blood pressure well controlled, goal bp <140/90 Continue current medications and dietary modifications follow metabolic panel - COMPLETE METABOLIC PANEL WITH GFR - CBC with Differential/Platelet  3. Acquired hypothyroidism TSH elevated, continues on synthroid 175 mcg.  Will follow up TSH - TSH  4. Coronary artery disease involving native coronary artery of native heart without angina pectoris Stable without chest pains. Continues on asa and plavix with crestor and zetia at this time.   - Lipid panel  5. Chronic congestive heart failure, unspecified heart failure type (HCC) -euvolemic at this time, had missed lasix and ended up in the hospital at [facility]. Continues on lasix with metoprolol at this  time. Recommend follow up with cardiology  6. Need for influenza vaccination - Flu Vaccine Trivalent High Dose (Fluad)  7. Generalized anxiety disorder Ongoing, noted to be off escitalopram   8. Mood disorder (HCC) Ongoing, followed by psychiatry, she was off escitalopram and mood has been much worse however has now restarted   9. PAF (paroxysmal atrial fibrillation) (HCC) Rate controlled on metoprolol, SR at this time Off coagulation due to hx of GI bleed and hx of falls.   Return in  about 4 months (around 10/14/2023) for routine follow up .  Janene Harvey. Biagio Borg [facility] [phone]

## 2023-06-16 NOTE — Patient Instructions (Addendum)
Tylenol 500 mg tablet 2 tablets every 8 hours as needed pain  Make sure you make appt with cardiologist to follow up [facility] [address] Office [phone]     Referral for orthopedic has been placed

## 2023-06-17 LAB — CBC WITH DIFFERENTIAL/PLATELET
Absolute Lymphocytes: 2132 {cells}/uL (ref 850–3900)
Absolute Monocytes: 500 {cells}/uL (ref 200–950)
Basophils Absolute: 41 {cells}/uL (ref 0–200)
Basophils Relative: 0.5 %
Eosinophils Absolute: 90 {cells}/uL (ref 15–500)
Eosinophils Relative: 1.1 %
HCT: 42.4 % (ref 35.0–45.0)
Hemoglobin: 13.9 g/dL (ref 11.7–15.5)
MCH: 28 pg (ref 27.0–33.0)
MCHC: 32.8 g/dL (ref 32.0–36.0)
MCV: 85.5 fL (ref 80.0–100.0)
MPV: 9.8 fL (ref 7.5–12.5)
Monocytes Relative: 6.1 %
Neutro Abs: 5437 {cells}/uL (ref 1500–7800)
Neutrophils Relative %: 66.3 %
Platelets: 209 10*3/uL (ref 140–400)
RBC: 4.96 10*6/uL (ref 3.80–5.10)
RDW: 13.1 % (ref 11.0–15.0)
Total Lymphocyte: 26 %
WBC: 8.2 10*3/uL (ref 3.8–10.8)

## 2023-06-17 LAB — TSH: TSH: 0.41 m[IU]/L (ref 0.40–4.50)

## 2023-06-17 LAB — COMPLETE METABOLIC PANEL WITH GFR
AG Ratio: 1.6 (calc) (ref 1.0–2.5)
ALT: 10 U/L (ref 6–29)
AST: 13 U/L (ref 10–35)
Albumin: 4.1 g/dL (ref 3.6–5.1)
Alkaline phosphatase (APISO): 73 U/L (ref 37–153)
BUN/Creatinine Ratio: 17 (calc) (ref 6–22)
BUN: 24 mg/dL (ref 7–25)
CO2: 26 mmol/L (ref 20–32)
Calcium: 9.4 mg/dL (ref 8.6–10.4)
Chloride: 99 mmol/L (ref 98–110)
Creat: 1.45 mg/dL — ABNORMAL HIGH (ref 0.60–0.95)
Globulin: 2.6 g/dL (ref 1.9–3.7)
Glucose, Bld: 104 mg/dL (ref 65–139)
Potassium: 3.6 mmol/L (ref 3.5–5.3)
Sodium: 139 mmol/L (ref 135–146)
Total Bilirubin: 1.3 mg/dL — ABNORMAL HIGH (ref 0.2–1.2)
Total Protein: 6.7 g/dL (ref 6.1–8.1)
eGFR: 36 mL/min/{1.73_m2} — ABNORMAL LOW (ref >=60)

## 2023-06-17 LAB — LIPID PANEL
Cholesterol: 98 mg/dL (ref <200)
HDL: 32 mg/dL — ABNORMAL LOW (ref >=50)
LDL Cholesterol (Calc): 40 mg/dL
Non-HDL Cholesterol (Calc): 66 mg/dL (ref <130)
Total CHOL/HDL Ratio: 3.1 (calc) (ref <5.0)
Triglycerides: 187 mg/dL — ABNORMAL HIGH (ref <150)

## 2023-06-20 ENCOUNTER — Ambulatory Visit: Payer: Medicare HMO | Admitting: Orthopaedic Surgery

## 2023-06-20 ENCOUNTER — Other Ambulatory Visit (INDEPENDENT_AMBULATORY_CARE_PROVIDER_SITE_OTHER): Payer: Self-pay

## 2023-06-20 ENCOUNTER — Other Ambulatory Visit (INDEPENDENT_AMBULATORY_CARE_PROVIDER_SITE_OTHER): Payer: Medicare HMO

## 2023-06-20 ENCOUNTER — Encounter: Payer: Self-pay | Admitting: Orthopaedic Surgery

## 2023-06-20 VITALS — BP 121/80 | HR 73 | Ht 61.0 in | Wt 171.0 lb

## 2023-06-20 DIAGNOSIS — M7542 Impingement syndrome of left shoulder: Secondary | ICD-10-CM | POA: Diagnosis not present

## 2023-06-20 DIAGNOSIS — G8929 Other chronic pain: Secondary | ICD-10-CM

## 2023-06-20 DIAGNOSIS — M542 Cervicalgia: Secondary | ICD-10-CM

## 2023-06-20 DIAGNOSIS — M25512 Pain in left shoulder: Secondary | ICD-10-CM | POA: Diagnosis not present
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
## 2023-06-21 ENCOUNTER — Encounter: Payer: Self-pay | Admitting: Psychiatry

## 2023-06-22 DIAGNOSIS — M7542 Impingement syndrome of left shoulder: Secondary | ICD-10-CM | POA: Diagnosis not present

## 2023-06-22 DIAGNOSIS — M542 Cervicalgia: Secondary | ICD-10-CM | POA: Diagnosis not present

## 2023-06-22 DIAGNOSIS — M25512 Pain in left shoulder: Secondary | ICD-10-CM | POA: Diagnosis not present

## 2023-06-22 DIAGNOSIS — G8929 Other chronic pain: Secondary | ICD-10-CM | POA: Diagnosis not present

## 2023-06-22 MED ORDER — METHYLPREDNISOLONE ACETATE 40 MG/ML IJ SUSP
40.0000 mg | INTRAMUSCULAR | Status: AC | PRN
  Administered 2023-06-22: 40 mg via INTRA_ARTICULAR

## 2023-06-22 MED ORDER — LIDOCAINE HCL 1 % IJ SOLN
0.5000 mL | INTRAMUSCULAR | Status: AC | PRN
  Administered 2023-06-22: .5 mL

## 2023-06-22 MED ORDER — BUPIVACAINE HCL 0.25 % IJ SOLN
4.0000 mL | INTRAMUSCULAR | Status: AC | PRN
  Administered 2023-06-22: 4 mL via INTRA_ARTICULAR

## 2023-06-23 ENCOUNTER — Other Ambulatory Visit: Payer: Self-pay | Admitting: Nurse Practitioner

## 2023-06-23 DIAGNOSIS — E785 Hyperlipidemia, unspecified: Secondary | ICD-10-CM

## 2023-06-26 DIAGNOSIS — I480 Paroxysmal atrial fibrillation: Secondary | ICD-10-CM | POA: Diagnosis not present

## 2023-06-26 DIAGNOSIS — I739 Peripheral vascular disease, unspecified: Secondary | ICD-10-CM | POA: Diagnosis not present

## 2023-06-26 DIAGNOSIS — I214 Non-ST elevation (NSTEMI) myocardial infarction: Secondary | ICD-10-CM | POA: Diagnosis not present

## 2023-06-26 DIAGNOSIS — I5022 Chronic systolic (congestive) heart failure: Secondary | ICD-10-CM | POA: Diagnosis not present

## 2023-06-26 DIAGNOSIS — I2510 Atherosclerotic heart disease of native coronary artery without angina pectoris: Secondary | ICD-10-CM | POA: Diagnosis not present

## 2023-06-26 DIAGNOSIS — D696 Thrombocytopenia, unspecified: Secondary | ICD-10-CM | POA: Diagnosis not present

## 2023-06-26 DIAGNOSIS — I130 Hypertensive heart and chronic kidney disease with heart failure and stage 1 through stage 4 chronic kidney disease, or unspecified chronic kidney disease: Secondary | ICD-10-CM | POA: Diagnosis not present

## 2023-06-26 DIAGNOSIS — N179 Acute kidney failure, unspecified: Secondary | ICD-10-CM | POA: Diagnosis not present

## 2023-06-26 DIAGNOSIS — N183 Chronic kidney disease, stage 3 unspecified: Secondary | ICD-10-CM | POA: Diagnosis not present

## 2023-06-26 NOTE — Telephone Encounter (Signed)
Patient medication has High Risk Warnings. Medication pend and sent to PCP Janyth Contes Janene Harvey, NP for approval.

## 2023-07-04 DIAGNOSIS — I5022 Chronic systolic (congestive) heart failure: Secondary | ICD-10-CM | POA: Diagnosis not present

## 2023-07-04 DIAGNOSIS — I480 Paroxysmal atrial fibrillation: Secondary | ICD-10-CM | POA: Diagnosis not present

## 2023-07-04 DIAGNOSIS — N179 Acute kidney failure, unspecified: Secondary | ICD-10-CM | POA: Diagnosis not present

## 2023-07-04 DIAGNOSIS — I130 Hypertensive heart and chronic kidney disease with heart failure and stage 1 through stage 4 chronic kidney disease, or unspecified chronic kidney disease: Secondary | ICD-10-CM | POA: Diagnosis not present

## 2023-07-04 DIAGNOSIS — D696 Thrombocytopenia, unspecified: Secondary | ICD-10-CM | POA: Diagnosis not present

## 2023-07-04 DIAGNOSIS — I214 Non-ST elevation (NSTEMI) myocardial infarction: Secondary | ICD-10-CM | POA: Diagnosis not present

## 2023-07-04 DIAGNOSIS — I739 Peripheral vascular disease, unspecified: Secondary | ICD-10-CM | POA: Diagnosis not present

## 2023-07-04 DIAGNOSIS — I2510 Atherosclerotic heart disease of native coronary artery without angina pectoris: Secondary | ICD-10-CM | POA: Diagnosis not present

## 2023-07-04 DIAGNOSIS — N183 Chronic kidney disease, stage 3 unspecified: Secondary | ICD-10-CM | POA: Diagnosis not present

## 2023-07-17 ENCOUNTER — Encounter: Payer: Self-pay | Admitting: Nurse Practitioner

## 2023-07-17 ENCOUNTER — Encounter: Payer: Medicare HMO | Admitting: Nurse Practitioner

## 2023-07-17 NOTE — Progress Notes (Signed)
This encounter was created in error - please disregard.

## 2023-07-17 NOTE — Progress Notes (Signed)
   This service is provided via telemedicine  No vital signs collected/recorded due to the encounter was a telemedicine visit.   Location of patient (ex: home, work):  Home  Patient consents to a telephone visit: Yes  Location of the provider (ex: office, home):  [facility], Office   Name of any referring provider:  N/A  Names of all persons participating in the telemedicine service and their role in the encounter:  S.Chrae B/CMA, Abbey Chatters, NP, and Patient   Time spent on call:  9 min with medical assistant

## 2023-07-19 ENCOUNTER — Telehealth: Payer: Self-pay | Admitting: Orthopaedic Surgery

## 2023-07-19 NOTE — Telephone Encounter (Signed)
Pt's daughter called stating that the injection did not work. Pt's daughter Dot Lanes phone number is [phone].

## 2023-07-20 NOTE — Telephone Encounter (Signed)
I left voicemail for patient's daughter advising I could work in to schedule next Tuesday if they wanted. Dr. Ophelia Charter is out of the office today and tomorrow.  Asked for return call.

## 2023-08-15 ENCOUNTER — Telehealth: Payer: Self-pay | Admitting: Psychiatry

## 2023-08-15 ENCOUNTER — Ambulatory Visit: Payer: Medicare HMO | Admitting: Orthopaedic Surgery

## 2023-08-15 ENCOUNTER — Other Ambulatory Visit: Payer: Self-pay

## 2023-08-15 DIAGNOSIS — F411 Generalized anxiety disorder: Secondary | ICD-10-CM

## 2023-08-15 MED ORDER — ALPRAZOLAM 0.5 MG PO TABS: 0.5000 mg | ORAL_TABLET | Freq: Every day | ORAL | 5 refills | Status: DC | PRN
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
## 2023-08-15 NOTE — Telephone Encounter (Addendum)
 With the abrupt onset of these symptoms, it seems likely that she is experiencing delirium from a possible underlying medical cause such as an infection or electrolyte imbalance. Recommend following up with PCP for any signs of infection or acute medical change. Also recommend follow-up with psych provider. Daughter can take her to [facility] if unable to manage at home. Script was sent for Alprazolam . Please ask daughter if she would prefer to have Depakote  in another form if it is difficult getting pt to swallow tablets. Depakote  comes in a liquid and also in sprinkles (they are in a capsule that can be opened up and sprinkled in food).

## 2023-08-15 NOTE — Telephone Encounter (Signed)
 LVM to [facility]
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
## 2023-08-16 ENCOUNTER — Emergency Department (HOSPITAL_COMMUNITY): Payer: Medicare HMO

## 2023-08-16 ENCOUNTER — Other Ambulatory Visit: Payer: Self-pay

## 2023-08-16 ENCOUNTER — Encounter (HOSPITAL_COMMUNITY): Payer: Self-pay | Admitting: Emergency Medicine

## 2023-08-16 ENCOUNTER — Inpatient Hospital Stay (HOSPITAL_COMMUNITY)
Admission: EM | Admit: 2023-08-16 | Discharge: 2023-08-23 | DRG: 689 | Disposition: A | Discharge status: Skilled Nursing Facility | Payer: Medicare HMO | Attending: Internal Medicine | Admitting: Internal Medicine

## 2023-08-16 DIAGNOSIS — Z951 Presence of aortocoronary bypass graft: Secondary | ICD-10-CM | POA: Diagnosis not present

## 2023-08-16 DIAGNOSIS — S069XAS Unspecified intracranial injury with loss of consciousness status unknown, sequela: Secondary | ICD-10-CM | POA: Diagnosis not present

## 2023-08-16 DIAGNOSIS — I2510 Atherosclerotic heart disease of native coronary artery without angina pectoris: Secondary | ICD-10-CM | POA: Diagnosis present

## 2023-08-16 DIAGNOSIS — F32A Depression, unspecified: Secondary | ICD-10-CM | POA: Diagnosis present

## 2023-08-16 DIAGNOSIS — Z8249 Family history of ischemic heart disease and other diseases of the circulatory system: Secondary | ICD-10-CM | POA: Diagnosis not present

## 2023-08-16 DIAGNOSIS — G4733 Obstructive sleep apnea (adult) (pediatric): Secondary | ICD-10-CM | POA: Diagnosis present

## 2023-08-16 DIAGNOSIS — M79602 Pain in left arm: Secondary | ICD-10-CM | POA: Diagnosis present

## 2023-08-16 DIAGNOSIS — M79603 Pain in arm, unspecified: Secondary | ICD-10-CM | POA: Diagnosis not present

## 2023-08-16 DIAGNOSIS — G9341 Metabolic encephalopathy: Secondary | ICD-10-CM | POA: Diagnosis present

## 2023-08-16 DIAGNOSIS — I7092 Chronic total occlusion of artery of the extremities: Secondary | ICD-10-CM | POA: Diagnosis present

## 2023-08-16 DIAGNOSIS — F411 Generalized anxiety disorder: Secondary | ICD-10-CM

## 2023-08-16 DIAGNOSIS — Z886 Allergy status to analgesic agent status: Secondary | ICD-10-CM

## 2023-08-16 DIAGNOSIS — Z87891 Personal history of nicotine dependence: Secondary | ICD-10-CM | POA: Diagnosis not present

## 2023-08-16 DIAGNOSIS — I252 Old myocardial infarction: Secondary | ICD-10-CM

## 2023-08-16 DIAGNOSIS — E669 Obesity, unspecified: Secondary | ICD-10-CM | POA: Diagnosis present

## 2023-08-16 DIAGNOSIS — Z885 Allergy status to narcotic agent status: Secondary | ICD-10-CM

## 2023-08-16 DIAGNOSIS — F0390 Unspecified dementia without behavioral disturbance: Secondary | ICD-10-CM | POA: Diagnosis not present

## 2023-08-16 DIAGNOSIS — F0393 Unspecified dementia, unspecified severity, with mood disturbance: Secondary | ICD-10-CM | POA: Diagnosis present

## 2023-08-16 DIAGNOSIS — I739 Peripheral vascular disease, unspecified: Secondary | ICD-10-CM | POA: Diagnosis present

## 2023-08-16 DIAGNOSIS — Z955 Presence of coronary angioplasty implant and graft: Secondary | ICD-10-CM

## 2023-08-16 DIAGNOSIS — Z79890 Hormone replacement therapy (postmenopausal): Secondary | ICD-10-CM | POA: Diagnosis not present

## 2023-08-16 DIAGNOSIS — R410 Disorientation, unspecified: Principal | ICD-10-CM

## 2023-08-16 DIAGNOSIS — F0394 Unspecified dementia, unspecified severity, with anxiety: Secondary | ICD-10-CM | POA: Diagnosis present

## 2023-08-16 DIAGNOSIS — N1831 Chronic kidney disease, stage 3a: Secondary | ICD-10-CM | POA: Diagnosis present

## 2023-08-16 DIAGNOSIS — B9620 Unspecified Escherichia coli [E. coli] as the cause of diseases classified elsewhere: Secondary | ICD-10-CM | POA: Diagnosis present

## 2023-08-16 DIAGNOSIS — I480 Paroxysmal atrial fibrillation: Secondary | ICD-10-CM | POA: Diagnosis present

## 2023-08-16 DIAGNOSIS — M79632 Pain in left forearm: Secondary | ICD-10-CM | POA: Diagnosis not present

## 2023-08-16 DIAGNOSIS — Z6832 Body mass index (BMI) 32.0-32.9, adult: Secondary | ICD-10-CM

## 2023-08-16 DIAGNOSIS — I5042 Chronic combined systolic (congestive) and diastolic (congestive) heart failure: Secondary | ICD-10-CM | POA: Diagnosis present

## 2023-08-16 DIAGNOSIS — M25512 Pain in left shoulder: Secondary | ICD-10-CM | POA: Diagnosis present

## 2023-08-16 DIAGNOSIS — I6522 Occlusion and stenosis of left carotid artery: Secondary | ICD-10-CM | POA: Diagnosis present

## 2023-08-16 DIAGNOSIS — Z96652 Presence of left artificial knee joint: Secondary | ICD-10-CM | POA: Diagnosis present

## 2023-08-16 DIAGNOSIS — Z888 Allergy status to other drugs, medicaments and biological substances status: Secondary | ICD-10-CM

## 2023-08-16 DIAGNOSIS — I6523 Occlusion and stenosis of bilateral carotid arteries: Secondary | ICD-10-CM | POA: Diagnosis not present

## 2023-08-16 DIAGNOSIS — R451 Restlessness and agitation: Secondary | ICD-10-CM | POA: Diagnosis present

## 2023-08-16 DIAGNOSIS — I771 Stricture of artery: Secondary | ICD-10-CM | POA: Diagnosis present

## 2023-08-16 DIAGNOSIS — I130 Hypertensive heart and chronic kidney disease with heart failure and stage 1 through stage 4 chronic kidney disease, or unspecified chronic kidney disease: Secondary | ICD-10-CM | POA: Diagnosis present

## 2023-08-16 DIAGNOSIS — Z87820 Personal history of traumatic brain injury: Secondary | ICD-10-CM

## 2023-08-16 DIAGNOSIS — E039 Hypothyroidism, unspecified: Secondary | ICD-10-CM | POA: Diagnosis present

## 2023-08-16 DIAGNOSIS — Z79899 Other long term (current) drug therapy: Secondary | ICD-10-CM | POA: Diagnosis not present

## 2023-08-16 DIAGNOSIS — Z7982 Long term (current) use of aspirin: Secondary | ICD-10-CM

## 2023-08-16 DIAGNOSIS — Z884 Allergy status to anesthetic agent status: Secondary | ICD-10-CM

## 2023-08-16 DIAGNOSIS — Z7902 Long term (current) use of antithrombotics/antiplatelets: Secondary | ICD-10-CM

## 2023-08-16 DIAGNOSIS — N182 Chronic kidney disease, stage 2 (mild): Secondary | ICD-10-CM | POA: Diagnosis present

## 2023-08-16 DIAGNOSIS — E871 Hypo-osmolality and hyponatremia: Secondary | ICD-10-CM | POA: Diagnosis present

## 2023-08-16 DIAGNOSIS — G8929 Other chronic pain: Secondary | ICD-10-CM | POA: Diagnosis not present

## 2023-08-16 DIAGNOSIS — N3000 Acute cystitis without hematuria: Principal | ICD-10-CM | POA: Diagnosis present

## 2023-08-16 DIAGNOSIS — M19012 Primary osteoarthritis, left shoulder: Secondary | ICD-10-CM | POA: Diagnosis present

## 2023-08-16 DIAGNOSIS — Z860100 Personal history of colon polyps, unspecified: Secondary | ICD-10-CM

## 2023-08-16 DIAGNOSIS — I10 Essential (primary) hypertension: Secondary | ICD-10-CM | POA: Diagnosis not present

## 2023-08-16 DIAGNOSIS — K219 Gastro-esophageal reflux disease without esophagitis: Secondary | ICD-10-CM | POA: Diagnosis present

## 2023-08-16 DIAGNOSIS — R41841 Cognitive communication deficit: Secondary | ICD-10-CM | POA: Diagnosis not present

## 2023-08-16 DIAGNOSIS — E782 Mixed hyperlipidemia: Secondary | ICD-10-CM | POA: Diagnosis present

## 2023-08-16 DIAGNOSIS — M79629 Pain in unspecified upper arm: Secondary | ICD-10-CM | POA: Diagnosis not present

## 2023-08-16 DIAGNOSIS — R609 Edema, unspecified: Secondary | ICD-10-CM | POA: Diagnosis not present

## 2023-08-16 DIAGNOSIS — M6281 Muscle weakness (generalized): Secondary | ICD-10-CM | POA: Diagnosis not present

## 2023-08-16 DIAGNOSIS — R4182 Altered mental status, unspecified: Secondary | ICD-10-CM | POA: Diagnosis not present

## 2023-08-16 DIAGNOSIS — N390 Urinary tract infection, site not specified: Secondary | ICD-10-CM | POA: Diagnosis present

## 2023-08-16 LAB — COMPREHENSIVE METABOLIC PANEL
ALT: 28 U/L (ref 0–44)
AST: 19 U/L (ref 15–41)
Albumin: 4 g/dL (ref 3.5–5.0)
Alkaline Phosphatase: 63 U/L (ref 38–126)
Anion gap: 9 (ref 5–15)
BUN: 20 mg/dL (ref 8–23)
CO2: 25 mmol/L (ref 22–32)
Calcium: 9.1 mg/dL (ref 8.9–10.3)
Chloride: 103 mmol/L (ref 98–111)
Creatinine, Ser: 1.08 mg/dL — ABNORMAL HIGH (ref 0.44–1.00)
GFR, Estimated: 52 mL/min — ABNORMAL LOW (ref >60)
Glucose, Bld: 100 mg/dL — ABNORMAL HIGH (ref 70–99)
Potassium: 4.2 mmol/L (ref 3.5–5.1)
Sodium: 137 mmol/L (ref 135–145)
Total Bilirubin: 1.9 mg/dL — ABNORMAL HIGH (ref 0.0–1.2)
Total Protein: 6.8 g/dL (ref 6.5–8.1)

## 2023-08-16 LAB — CBC WITH DIFFERENTIAL/PLATELET
Abs Immature Granulocytes: 0.02 10*3/uL (ref 0.00–0.07)
Basophils Absolute: 0 10*3/uL (ref 0.0–0.1)
Basophils Relative: 0 %
Eosinophils Absolute: 0.1 10*3/uL (ref 0.0–0.5)
Eosinophils Relative: 1 %
HCT: 38.5 % (ref 36.0–46.0)
Hemoglobin: 12.9 g/dL (ref 12.0–15.0)
Immature Granulocytes: 0 %
Lymphocytes Relative: 34 %
Lymphs Abs: 1.8 10*3/uL (ref 0.7–4.0)
MCH: 29.5 pg (ref 26.0–34.0)
MCHC: 33.5 g/dL (ref 30.0–36.0)
MCV: 88.1 fL (ref 80.0–100.0)
Monocytes Absolute: 0.4 10*3/uL (ref 0.1–1.0)
Monocytes Relative: 7 %
Neutro Abs: 3.2 10*3/uL (ref 1.7–7.7)
Neutrophils Relative %: 58 %
Platelets: 152 10*3/uL (ref 150–400)
RBC: 4.37 MIL/uL (ref 3.87–5.11)
RDW: 14.3 % (ref 11.5–15.5)
WBC: 5.4 10*3/uL (ref 4.0–10.5)
nRBC: 0 % (ref 0.0–0.2)

## 2023-08-16 LAB — I-STAT CHEM 8, ED
BUN: 18 mg/dL (ref 8–23)
Calcium, Ion: 1.2 mmol/L (ref 1.15–1.40)
Chloride: 102 mmol/L (ref 98–111)
Creatinine, Ser: 1.3 mg/dL — ABNORMAL HIGH (ref 0.44–1.00)
Glucose, Bld: 92 mg/dL (ref 70–99)
HCT: 39 % (ref 36.0–46.0)
Hemoglobin: 13.3 g/dL (ref 12.0–15.0)
Potassium: 4.3 mmol/L (ref 3.5–5.1)
Sodium: 138 mmol/L (ref 135–145)
TCO2: 25 mmol/L (ref 22–32)

## 2023-08-16 LAB — URINALYSIS, W/ REFLEX TO CULTURE (INFECTION SUSPECTED)
Bacteria, UA: NONE SEEN
Bilirubin Urine: NEGATIVE
Glucose, UA: NEGATIVE mg/dL
Hgb urine dipstick: NEGATIVE
Ketones, ur: NEGATIVE mg/dL
Nitrite: NEGATIVE
Protein, ur: NEGATIVE mg/dL
Specific Gravity, Urine: 1.032 — ABNORMAL HIGH (ref 1.005–1.030)
pH: 7 (ref 5.0–8.0)

## 2023-08-16 LAB — TROPONIN I (HIGH SENSITIVITY)
Troponin I (High Sensitivity): 22 ng/L — ABNORMAL HIGH (ref <18)
Troponin I (High Sensitivity): 21 ng/L — ABNORMAL HIGH (ref <18)

## 2023-08-16 LAB — I-STAT CG4 LACTIC ACID, ED: Lactic Acid, Venous: 0.6 mmol/L (ref 0.5–1.9)

## 2023-08-16 MED ORDER — ACETAMINOPHEN 500 MG PO TABS
1000.0000 mg | ORAL_TABLET | Freq: Three times a day (TID) | ORAL | Status: DC
  Administered 2023-08-16 – 2023-08-23 (×19): 1000 mg via ORAL
  Filled 2023-08-16 (×19): qty 2

## 2023-08-16 MED ORDER — DIVALPROEX SODIUM ER 250 MG PO TB24
250.0000 mg | ORAL_TABLET | Freq: Every day | ORAL | Status: DC
  Administered 2023-08-16 – 2023-08-22 (×7): 250 mg via ORAL
  Filled 2023-08-16 (×7): qty 1

## 2023-08-16 MED ORDER — GABAPENTIN 100 MG PO CAPS
300.0000 mg | ORAL_CAPSULE | Freq: Every day | ORAL | Status: DC
  Administered 2023-08-17 – 2023-08-23 (×7): 300 mg via ORAL
  Filled 2023-08-16 (×7): qty 3

## 2023-08-16 MED ORDER — BUSPIRONE HCL 5 MG PO TABS
15.0000 mg | ORAL_TABLET | Freq: Two times a day (BID) | ORAL | Status: DC
  Administered 2023-08-16 – 2023-08-23 (×14): 15 mg via ORAL
  Filled 2023-08-16 (×14): qty 3

## 2023-08-16 MED ORDER — CEFTRIAXONE SODIUM 1 G IJ SOLR
1.0000 g | INTRAMUSCULAR | Status: AC
  Administered 2023-08-17 – 2023-08-20 (×4): 1 g via INTRAVENOUS
  Filled 2023-08-16 (×4): qty 10

## 2023-08-16 MED ORDER — SODIUM CHLORIDE 0.9 % IV SOLN
2.0000 g | Freq: Once | INTRAVENOUS | Status: AC
  Administered 2023-08-16: 2 g via INTRAVENOUS
  Filled 2023-08-16: qty 20

## 2023-08-16 MED ORDER — ENOXAPARIN SODIUM 40 MG/0.4ML IJ SOSY
40.0000 mg | PREFILLED_SYRINGE | INTRAMUSCULAR | Status: DC
  Administered 2023-08-16 – 2023-08-22 (×7): 40 mg via SUBCUTANEOUS
  Filled 2023-08-16 (×7): qty 0.4

## 2023-08-16 MED ORDER — IOHEXOL 350 MG/ML SOLN
100.0000 mL | Freq: Once | INTRAVENOUS | Status: AC | PRN
  Administered 2023-08-16: 100 mL via INTRAVENOUS

## 2023-08-16 MED ORDER — EZETIMIBE 10 MG PO TABS
10.0000 mg | ORAL_TABLET | Freq: Every day | ORAL | Status: DC
  Administered 2023-08-17 – 2023-08-23 (×7): 10 mg via ORAL
  Filled 2023-08-16 (×7): qty 1

## 2023-08-16 MED ORDER — ESCITALOPRAM OXALATE 20 MG PO TABS
20.0000 mg | ORAL_TABLET | Freq: Every day | ORAL | Status: DC
  Administered 2023-08-17 – 2023-08-23 (×7): 20 mg via ORAL
  Filled 2023-08-16 (×7): qty 1

## 2023-08-16 MED ORDER — SENNOSIDES-DOCUSATE SODIUM 8.6-50 MG PO TABS: 1.0000 | ORAL_TABLET | Freq: Every evening | ORAL | Status: DC | PRN

## 2023-08-16 MED ORDER — ASPIRIN 81 MG PO TBEC
81.0000 mg | DELAYED_RELEASE_TABLET | Freq: Every day | ORAL | Status: DC
  Administered 2023-08-16 – 2023-08-22 (×7): 81 mg via ORAL
  Filled 2023-08-16 (×7): qty 1

## 2023-08-16 MED ORDER — METOPROLOL SUCCINATE ER 25 MG PO TB24
25.0000 mg | ORAL_TABLET | Freq: Every day | ORAL | Status: DC
  Administered 2023-08-16 – 2023-08-23 (×8): 25 mg via ORAL
  Filled 2023-08-16 (×8): qty 1

## 2023-08-16 MED ORDER — ONDANSETRON HCL 4 MG/2ML IJ SOLN: 4.0000 mg | Freq: Four times a day (QID) | INTRAMUSCULAR | Status: DC | PRN

## 2023-08-16 MED ORDER — METOPROLOL SUCCINATE ER 25 MG PO TB24: 25.0000 mg | ORAL_TABLET | Freq: Every day | ORAL | Status: DC

## 2023-08-16 MED ORDER — VITAMIN B-12 100 MCG PO TABS
100.0000 ug | ORAL_TABLET | Freq: Every day | ORAL | Status: DC
  Administered 2023-08-17 – 2023-08-23 (×7): 100 ug via ORAL
  Filled 2023-08-16 (×7): qty 1

## 2023-08-16 MED ORDER — CLOPIDOGREL BISULFATE 75 MG PO TABS
75.0000 mg | ORAL_TABLET | Freq: Every day | ORAL | Status: DC
  Administered 2023-08-16 – 2023-08-22 (×7): 75 mg via ORAL
  Filled 2023-08-16 (×7): qty 1

## 2023-08-16 MED ORDER — ONDANSETRON HCL 4 MG PO TABS: 4.0000 mg | ORAL_TABLET | Freq: Four times a day (QID) | ORAL | Status: DC | PRN

## 2023-08-16 MED ORDER — LEVOTHYROXINE SODIUM 125 MCG PO TABS
175.0000 ug | ORAL_TABLET | Freq: Every day | ORAL | Status: DC
  Administered 2023-08-17 – 2023-08-23 (×6): 175 ug via ORAL
  Filled 2023-08-16 (×7): qty 1

## 2023-08-16 MED ORDER — PANTOPRAZOLE SODIUM 40 MG PO TBEC
40.0000 mg | DELAYED_RELEASE_TABLET | Freq: Every day | ORAL | Status: DC
  Administered 2023-08-17 – 2023-08-23 (×7): 40 mg via ORAL
  Filled 2023-08-16 (×7): qty 1

## 2023-08-16 MED ORDER — VITAMIN D 25 MCG (1000 UNIT) PO TABS
5000.0000 [IU] | ORAL_TABLET | Freq: Every day | ORAL | Status: DC
  Administered 2023-08-16 – 2023-08-22 (×7): 5000 [IU] via ORAL
  Filled 2023-08-16 (×7): qty 5

## 2023-08-16 MED ORDER — DORZOLAMIDE HCL 2 % OP SOLN
1.0000 [drp] | Freq: Two times a day (BID) | OPHTHALMIC | Status: DC
Start: 2023-08-16 — End: 2023-08-23
  Administered 2023-08-16 – 2023-08-23 (×14): 1 [drp] via OPHTHALMIC
  Filled 2023-08-16: qty 10

## 2023-08-16 MED ORDER — ROSUVASTATIN CALCIUM 20 MG PO TABS
40.0000 mg | ORAL_TABLET | Freq: Every day | ORAL | Status: DC
  Administered 2023-08-16 – 2023-08-22 (×7): 40 mg via ORAL
  Filled 2023-08-16 (×7): qty 2

## 2023-08-16 MED ORDER — ALPRAZOLAM 0.5 MG PO TABS
0.5000 mg | ORAL_TABLET | Freq: Every day | ORAL | Status: DC | PRN
  Administered 2023-08-16 – 2023-08-22 (×6): 0.5 mg via ORAL
  Filled 2023-08-16 (×7): qty 1

## 2023-08-16 MED ORDER — FUROSEMIDE 40 MG PO TABS
40.0000 mg | ORAL_TABLET | ORAL | Status: DC
Start: 2023-08-17 — End: 2023-08-23
  Administered 2023-08-17 – 2023-08-23 (×4): 40 mg via ORAL
  Filled 2023-08-16 (×6): qty 1

## 2023-08-16 MED ORDER — DICLOFENAC SODIUM 1 % EX GEL
2.0000 g | Freq: Four times a day (QID) | CUTANEOUS | Status: DC
  Administered 2023-08-16 – 2023-08-23 (×18): 2 g via TOPICAL
  Filled 2023-08-16 (×2): qty 100
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
## 2023-08-16 NOTE — ED Provider Notes (Signed)
 [place] EMERGENCY DEPARTMENT AT [facility] Provider Note   CSN: [id number] Arrival date & time: 08/16/23  1054     History  Chief Complaint  Patient presents with   Arm Pain    Deanna [name] is a 83 y.o. female.  HPI      83 year old female with a history of coronary artery disease, atrial fibrillation, CKD, combined systolic and diastolic congestive heart failure, celiac artery stenosis, PAD,  hyperlipidemia, hypertension, dementia who presents with concern for left arm pain and concern for increasing confusion.   1 week of increased agitation, nightmares, thinks daughter is kidnapping her. Has dementia but has never had symptoms like this.  She is up all night, agitated, more confused.    1 month or more of shoulder pain, had seen orthpedics, is getting injections to shoulder. Patient describes it as arm pain.  No numbness or weakness, just pain. Daughter states the pain has been more ongoing, now is only on tylenol  and gabapentin  for it.  Patient reports pain throughout arm.  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   CKD (chronic kidney disease), stage IV (HCC)    Cognitive impairment    Colon polyps    Community acquired pneumonia    Depression    Diverticulosis    Dysuria    Encephalomalacia    GAD (generalized anxiety disorder)    GERD (gastroesophageal reflux disease)    Glaucoma    Hearing loss    Hemorrhoids    Hypertension    Hypothyroidism    Incontinence    Mixed hyperlipidemia    Myocardial infarction (HCC)    NSTEMI (non-ST elevated myocardial infarction) (HCC) 12/19/2011   OSA on CPAP    PAF (paroxysmal atrial fibrillation)  (HCC)    Peripheral arterial disease (HCC)    Pneumonitis 04/26/2015   Pyelonephritis due to Escherichia coli 04/28/2015   Sepsis (HCC)    Spondylosis of lumbar region without myelopathy or radiculopathy    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HCC)    Urticaria    Vertigo, benign positional      Home Medications Prior to Admission medications   Medication Sig Start Date End Date Taking? Authorizing Provider  ALPRAZolam  (XANAX ) 0.5 MG tablet Take 1 tablet (0.5 mg total) by mouth daily as needed for anxiety. 08/15/23  Yes Franchot Raisin, PMHNP  aspirin  EC 81 MG tablet Take 81 mg by mouth at bedtime.   Yes [provider]  busPIRone  (BUSPAR ) 15 MG tablet Take 1 tablet (15 mg total) by mouth 2 (two) times daily. 11/16/22 08/08/48 Yes Franchot Raisin, PMHNP  Cholecalciferol  (VITAMIN D3) 125 MCG (5000 UT) CAPS Take 5,000 Units by mouth at bedtime.   Yes [provider]  clopidogrel  (PLAVIX ) 75 MG tablet TAKE 1 TABLET(75 MG) BY MOUTH DAILY Patient taking differently: Take 75 mg by mouth at bedtime. 02/27/23  Yes Eubanks, Jessica K, NP  Cyanocobalamin  (VITAMIN B-12 PO) Take 1 tablet by mouth in the morning.   Yes [provider]  divalproex  (DEPAKOTE  ER) 250 MG 24 hr tablet Take 1 tablet (250 mg total) by mouth at bedtime. 11/16/22  Yes Franchot Raisin, PMHNP  dorzolamide  (TRUSOPT ) 2 % ophthalmic  solution Place 1 drop into both eyes in the morning and at bedtime.   Yes [provider]  escitalopram  (LEXAPRO ) 20 MG tablet Take 1 tablet (20 mg total) by mouth daily. 11/16/22  Yes Franchot Raisin, PMHNP  ezetimibe  (ZETIA ) 10 MG tablet Take 10 mg by mouth daily.   Yes [provider]  furosemide  (LASIX ) 40 MG tablet TAKE 1 TABLET(40 MG) BY MOUTH DAILY Patient taking differently: Take 40 mg by mouth every other day. 06/03/22  Yes Caro Raisin POUR, NP  gabapentin  (NEURONTIN ) 100 MG capsule TAKE 2 CAPSULES(200 MG) BY MOUTH DAILY Patient taking differently: Take 300 mg by  mouth in the morning. 08/09/22  Yes Caro Raisin POUR, NP  ketoconazole  (NIZORAL ) 2 % shampoo APPLY 10ML TOPICALLY 2 TIMES A WEEK. Patient taking differently: Apply 10 mLs topically 2 (two) times a week. 11/03/22  Yes Caro Raisin POUR, NP  levothyroxine  (SYNTHROID ) 175 MCG tablet TAKE 1 TABLET BY MOUTH DAILY AT THE SAME TIME AND SEPARATE FROM ALL OTHER MEDICATIONS Patient taking differently: Take 175 mcg by mouth See admin instructions. Take 175 mcg by mouth in the morning before breakfast- SEPARATE FROM ALL OTHER MEDICATIONS 02/13/23  Yes Caro Raisin POUR, NP  meclizine  (ANTIVERT ) 25 MG tablet Take 1 tablet (25 mg total) by mouth as needed for dizziness. Patient taking differently: Take 25 mg by mouth 3 (three) times daily as needed for dizziness. 11/30/18  Yes Caro Raisin POUR, NP  metoprolol  succinate (TOPROL -XL) 25 MG 24 hr tablet Take 25 mg by mouth daily.   Yes [provider]  nitroGLYCERIN  (NITROSTAT ) 0.4 MG SL tablet Place 1 tablet (0.4 mg total) under the tongue every 5 (five) minutes x 3 doses as needed for chest pain. 05/20/21  Yes Ngetich, Dinah C, NP  pantoprazole  (PROTONIX ) 40 MG tablet TAKE 1 TABLET EVERY DAY Patient taking differently: Take 40 mg by mouth daily before breakfast. 09/20/21  Yes Eubanks, Jessica K, NP  rosuvastatin  (CRESTOR ) 40 MG tablet TAKE 1 TABLET(40 MG) BY MOUTH DAILY Patient taking differently: Take 40 mg by mouth at bedtime. 06/26/23  Yes Eubanks, Jessica K, NP  TYLENOL  500 MG tablet Take 1,000 mg by mouth in the morning.   Yes [provider]      Allergies    Lidocaine , Cymbalta  [duloxetine  hcl], Donepezil, Mobic [meloxicam], Namenda [memantine], Nsaids, Other, Oxycontin  [oxycodone ], and Vicodin [hydrocodone-acetaminophen ]    Review of Systems   Review of Systems  Physical Exam Updated Vital Signs BP (!) 177/90 (BP Location: Right Arm)   Pulse (!) 103   Temp 98.9 F (37.2 C) (Oral)   Resp 16   Ht 5' 1 (1.549 m)   Wt 77.6 kg    SpO2 95%   BMI 32.32 kg/m  Physical Exam Vitals and nursing note reviewed.  Constitutional:      General: She is not in acute distress.    Appearance: She is well-developed. She is not diaphoretic.  HENT:     Head: Normocephalic and atraumatic.  Eyes:     Conjunctiva/sclera: Conjunctivae normal.  Cardiovascular:     Rate and Rhythm: Normal rate and regular rhythm.     Heart sounds: Normal heart sounds. No murmur heard.    No friction rub. No gallop.  Pulmonary:     Effort: Pulmonary effort is normal. No respiratory distress.     Breath sounds: Normal breath sounds. No wheezing or rales.  Abdominal:     General: There is no distension.     Palpations:  Abdomen is soft.     Tenderness: There is no abdominal tenderness. There is no guarding.  Musculoskeletal:        General: Tenderness (left neck, shoulder) present.     Cervical back: Normal range of motion.     Comments: Normal cap refill, contusion to side of fourth finger, hands warm, normal strength and sensation Unable to palpate pulse left radial with some limitation of tremor (per vascular notes 2019 not able to palpate pulse)  Skin:    General: Skin is warm and dry.     Findings: No erythema or rash.  Neurological:     Mental Status: She is alert and oriented to person, place, and time.   d  ED Results / Procedures / Treatments   Labs (all labs ordered are listed, but only abnormal results are displayed) Labs Reviewed  COMPREHENSIVE METABOLIC PANEL - Abnormal; Notable for the following components:      Result Value   Glucose, Bld 100 (*)    Creatinine, Ser 1.08 (*)    Total Bilirubin 1.9 (*)    GFR, Estimated 52 (*)    All other components within normal limits  URINALYSIS, W/ REFLEX TO CULTURE (INFECTION SUSPECTED) - Abnormal; Notable for the following components:   Color, Urine STRAW (*)    Specific Gravity, Urine 1.032 (*)    Leukocytes,Ua MODERATE (*)    All other components within normal limits  I-STAT CHEM  8, ED - Abnormal; Notable for the following components:   Creatinine, Ser 1.30 (*)    All other components within normal limits  TROPONIN I (HIGH SENSITIVITY) - Abnormal; Notable for the following components:   Troponin I (High Sensitivity) 22 (*)    All other components within normal limits  TROPONIN I (HIGH SENSITIVITY) - Abnormal; Notable for the following components:   Troponin I (High Sensitivity) 21 (*)    All other components within normal limits  URINE CULTURE  CBC WITH DIFFERENTIAL/PLATELET  I-STAT CG4 LACTIC ACID, ED    EKG None  Radiology CT ANGIO UP EXTREM LEFT W &/OR WO CONTAST Result Date: 08/16/2023 CLINICAL DATA:  Pain.  Vascular murmur. EXAM: CT ANGIOGRAPHY UPPER LEFT EXTREMITY TECHNIQUE: CT angiogram imaging of the left upper extremity was performed after the administration of intravenous contrast. Coronal and sagittal reformats were submitted. MIP reformats were submitted as well. CONTRAST:  OMNIPAQUE  IOHEXOL  350 MG/ML SOLN COMPARISON:  None Available. FINDINGS: Arterial: There severe stenosis/occlusion of the proximal 3.5 cm of the left subclavian artery secondary to calcified atherosclerotic disease. The distal subclavian, axillary, brachial, ulnar, and radial arteries appear patent to the level of the wrist. There is no dissection or aneurysm. There is also severe stenosis of the origin of the left common carotid artery near the arch of the aorta. There also severe atherosclerotic calcifications of the distal left common carotid artery. Note is made of the accessory arterial branch extending from the patent portion of the left common carotid artery to the mid left subclavian artery. There is severe focal stenosis of the celiac artery, but visualized branches are otherwise within normal limits. Osseous: Degenerative changes are seen in the glenohumeral joint with joint space narrowing and osteophyte formation. No acute fracture or focal osseous lesion identified. Soft  tissues: No evidence for soft tissue infection, hematoma or gas. There soft tissue calcifications along the posteroinferior margin of the glenoid. There severe degenerative changes of the spine. Others: Subcentimeter renal cysts are present on the left. Colonic diverticula are present.  Review of the MIP images confirms the above findings. IMPRESSION: 1. 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 2. 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 3. Severe focal stenosis of the celiac artery. 4. Colonic diverticulosis. Electronically Signed   By: Greig Pique M.D.   On: 08/16/2023 16:36   CT Head Wo Contrast Result Date: 08/16/2023 CLINICAL DATA:  Delirium. Asymmetric upper extremity pulses, less prominent on the left. Left arm and shoulder pain. EXAM: CT HEAD WITHOUT CONTRAST TECHNIQUE: Contiguous axial images were obtained from the base of the skull through the vertex without intravenous contrast. RADIATION DOSE REDUCTION: This exam was performed according to the departmental dose-optimization program which includes automated exposure control, adjustment of the mA and/or kV according to patient size and/or use of iterative reconstruction technique. COMPARISON:  MR head without contrast 11/16/2016 FINDINGS: Brain: Mild atrophy and moderate periventricular white matter hypoattenuation demonstrates continued progression. Deep brain nuclei are within normal limits. No acute or focal cortical infarct is present. No acute hemorrhage or mass lesion is present. The ventricles are proportionate to the degree of atrophy. No significant extraaxial fluid collection is present. The brainstem and cerebellum are within normal limits. Midline structures are within normal limits. Vascular: Minimal calcifications are present within the cavernous internal carotid arteries. No hyperdense vessel is present. Skull: Calvarium is intact. No  focal lytic or blastic lesions are present. No significant extracranial soft tissue lesion is present. Sinuses/Orbits: The paranasal sinuses and mastoid air cells are clear. Bilateral lens replacements are noted. Globes and orbits are otherwise unremarkable. IMPRESSION: 1. No acute intracranial abnormality or significant interval change. 2. Mild atrophy and moderate periventricular white matter disease demonstrates continued progression. This likely reflects the sequela of chronic microvascular ischemia. Electronically Signed   By: Lonni Necessary M.D.   On: 08/16/2023 15:19    Procedures Procedures    Medications Ordered in ED Medications  enoxaparin  (LOVENOX ) injection 40 mg (40 mg Subcutaneous Given 08/16/23 2028)  senna-docusate (Senokot-S) tablet 1 tablet (has no administration in time range)  ondansetron  (ZOFRAN ) tablet 4 mg (has no administration in time range)    Or  ondansetron  (ZOFRAN ) injection 4 mg (has no administration in time range)  ALPRAZolam  (XANAX ) tablet 0.5 mg (0.5 mg Oral Given 08/16/23 2252)  aspirin  EC tablet 81 mg (81 mg Oral Given 08/16/23 2253)  busPIRone  (BUSPAR ) tablet 15 mg (15 mg Oral Given 08/16/23 2254)  cholecalciferol  (VITAMIN D3) 25 MCG (1000 UNIT) tablet 5,000 Units (5,000 Units Oral Given 08/16/23 2253)  clopidogrel  (PLAVIX ) tablet 75 mg (75 mg Oral Given 08/16/23 2253)  vitamin B-12 (CYANOCOBALAMIN ) tablet 100 mcg (has no administration in time range)  divalproex  (DEPAKOTE  ER) 24 hr tablet 250 mg (250 mg Oral Given 08/16/23 2253)  dorzolamide  (TRUSOPT ) 2 % ophthalmic solution 1 drop (1 drop Both Eyes Given 08/16/23 2300)  escitalopram  (LEXAPRO ) tablet 20 mg (has no administration in time range)  ezetimibe  (ZETIA ) tablet 10 mg (has no administration in time range)  gabapentin  (NEURONTIN ) capsule 300 mg (has no administration in time range)  levothyroxine  (SYNTHROID ) tablet 175 mcg (has no administration in time range)  pantoprazole  (PROTONIX ) EC tablet 40 mg (has  no administration in time range)  rosuvastatin  (CRESTOR ) tablet 40 mg (40 mg Oral Given 08/16/23 2253)  metoprolol  succinate (TOPROL -XL) 24 hr tablet 25 mg (25 mg Oral Given 08/16/23 2252)  acetaminophen  (TYLENOL ) tablet 1,000 mg (1,000 mg Oral Given 08/16/23 2253)  diclofenac   Sodium (VOLTAREN ) 1 % topical gel 2 g (2 g Topical Given 08/16/23 2300)  iohexol  (OMNIPAQUE ) 350 MG/ML injection 100 mL (100 mLs Intravenous Contrast Given 08/16/23 1454)  cefTRIAXone  (ROCEPHIN ) 2 g in sodium chloride  0.9 % 100 mL IVPB (0 g Intravenous Stopped 08/16/23 1804)    ED Course/ Medical Decision Making/ A&P                                  83 year old female with a history of coronary artery disease, atrial fibrillation, CKD, combined systolic and diastolic congestive heart failure, celiac artery stenosis, PAD,  hyperlipidemia, hypertension, dementia who presents with concern for left arm pain and concern for increasing confusion.  Regarding left arm pain---has been ongoing, seeing orthopedics, has asymmetric pulses on exam--prior notes from vascular report no palpable pulses left radial, hand is warm, appears well perfused. 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 CTA ordered and shows subclavian stenosis, carotid stenosis.  Troponin mild elevation but less than prior.  Discussed with Dr. Lanis broody on CTA seen through hand, BP 100s, does not think this is acute vascular issue.  She has had pain over the last month or more and seeing Orthopedics Dr. Barbarann for impingement of left shoulder.  Ordered arterial doppler for documentation and for vascular follow up.    Regarding altered mental status: DDx includes progressive dementia, delirium, CVA, ICH, infection, medications, other metabolic.  Labs completed and evaluated by me show no clinically significant electrolyte abnormality, anemia.   UA consistent with UTI. Presentation consistent with delirium secondary to UTI. with background of dementia.    Will admit  for further care.         Final Clinical Impression(s) / ED Diagnoses Final diagnoses:  Delirium  Urinary tract infection without hematuria, site unspecified  Left arm pain    Rx / DC Orders ED Discharge Orders     None         Dreama Longs, MD 08/16/23 2326

## 2023-08-16 NOTE — ED Provider Notes (Signed)
 Admit for delirium with UTI. Complains of arm sx left. Clinically well perfused. Awaiting doppler. Physical Exam  BP 110/87 (BP Location: Left Arm)   Pulse 98   Temp 97.6 F (36.4 C) (Oral)   Resp 15   Ht 5' 1 (1.549 m)   Wt 77.6 kg   SpO2 94%   BMI 32.32 kg/m   Physical Exam Patient is alert with clear mental status.  No acute distress.  No respiratory distress. Procedures  Procedures  ED Course / MDM    Medical Decision Making Amount and/or Complexity of Data Reviewed Labs: ordered. Radiology: ordered.  Risk Prescription drug management.   Reviewed with Dr. Lou [facility] hospitalist for admission.       Armenta Canning, MD 08/16/23 [phone]

## 2023-08-16 NOTE — Telephone Encounter (Signed)
 Left second VM to RC.

## 2023-08-16 NOTE — H&P (Signed)
 History and Physical    Patient: Deanna [name] FMW:[id number] DOB: 1941-04-23 DOA: 08/16/2023 DOS: the patient was seen and examined on 08/16/2023 PCP: Caro [name] POUR, NP  Patient coming from: Home  Chief Complaint:  Chief Complaint  Patient presents with   Arm Pain   HPI: Deanna [name] is a 83 y.o. 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  Daughter states that over the last 2 weeks, patient has been very demanding and sometimes calling her up to 20 times a day.  Patient has also become more agitated previously just at night but over the last 2 days she has been agitated during the day.  She recently called 911 stating that her daughter wanted to kill her and wandered off to her neighbor's house.  On evaluation, patient alert and oriented x 3. She endorsed some short-term memory loss and recent frequent urination but denies any dysuria, confusion, fevers, chills, dizziness, headache, abdominal pain, chest pain or shortness of breath.  She has chronic left shoulder pain and arm pain that is worse when she lifts her arm up.  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 All other systems reviewed and are negative. 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   CKD (chronic kidney disease), stage IV (HCC)    Cognitive impairment    Colon polyps    Community acquired pneumonia    Depression    Diverticulosis    Dysuria    Encephalomalacia    GAD (generalized anxiety disorder)    GERD (gastroesophageal reflux disease)    Glaucoma    Hearing loss    Hemorrhoids    Hypertension    Hypothyroidism    Incontinence    Mixed hyperlipidemia    Myocardial infarction (HCC)    NSTEMI (non-ST elevated myocardial infarction) (HCC) 12/19/2011   OSA on CPAP    PAF (paroxysmal atrial fibrillation) (HCC)    Peripheral arterial disease (HCC)    Pneumonitis 04/26/2015   Pyelonephritis due to Escherichia coli 04/28/2015   Sepsis (HCC)    Spondylosis of lumbar region without myelopathy or radiculopathy    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HCC)    Urticaria    Vertigo, benign positional    Past Surgical History:  Procedure Laterality Date   ABDOMINAL AORTOGRAM W/LOWER EXTREMITY N/A 08/22/2017  Procedure: ABDOMINAL AORTOGRAM W/LOWER EXTREMITY;  Surgeon: Serene Gaile ORN, MD;  Location: [facility] INVASIVE CV LAB;  Service: Cardiovascular;  Laterality: N/A;   BILATERAL UPPER EXTREMITY ANGIOGRAM N/A 09/11/2012   Procedure: BILATERAL UPPER EXTREMITY ANGIOGRAM;  Surgeon: Gaile ORN Serene, MD;  Location: [facility] CATH LAB;  Service: Cardiovascular;  Laterality: N/A;   BIOPSY  03/25/2020   Procedure: BIOPSY;  Surgeon: Wilhelmenia Aloha Raddle., MD;  Location: [facility] ENDOSCOPY;   Service: Gastroenterology;;   carotid duplex doppler Bilateral 09/03/2012, 11/03/2011   Evidence of 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 patent left common carotid- subclavian artery bypass with turbulent flow noted at the anastomosis with velocities of 295 cm/s   CAROTID-SUBCLAVIAN BYPASS GRAFT  12/15/2011   Procedure: BYPASS GRAFT CAROTID-SUBCLAVIAN;  Surgeon: Gaile ORN Serene, MD;  Location: [facility] OR;  Service: Vascular;  Laterality: Left;  Left Carotid subclavian bypass   CORONARY ANGIOPLASTY     CORONARY ARTERY BYPASS GRAFT     CORONARY ATHERECTOMY N/A 03/19/2020   Procedure: CORONARY ATHERECTOMY;  Surgeon: Dann Candyce RAMAN, MD;  Location: [facility] INVASIVE CV LAB;  Service: Cardiovascular;  Laterality: N/A;   CORONARY STENT INTERVENTION N/A 03/19/2020   Procedure: CORONARY STENT INTERVENTION;  Surgeon: Dann Candyce RAMAN, MD;  Location: [facility] INVASIVE CV LAB;  Service: Cardiovascular;  Laterality: N/A;   DOPPLER ECHOCARDIOGRAPHY  05/27/2010, 09/17/2008   Mild Proximal septal thickening is noted. Left ventricular systolic functions is normal ejection fraction =>55%. the aortic valve appears to be mildly sclerotic    ESOPHAGOGASTRODUODENOSCOPY (EGD) WITH PROPOFOL  N/A 03/25/2020   Procedure: ESOPHAGOGASTRODUODENOSCOPY (EGD) WITH PROPOFOL ;  Surgeon: Wilhelmenia Aloha Raddle., MD;  Location: [facility] ENDOSCOPY;  Service: Gastroenterology;  Laterality: N/A;   fem-fem bypass graft  1999   holter monitor  01/21/2008   The predominant rhythm was normal sinus rhythm. Minimum heartrate of 63 bpm at +01:00, maximum heartrate of 105 bpm at + 10:35; and the average heartrate of 75 bpm. Ventricular ectopic activity totaled 1270: Multifocal; 866-PVC's and 404-VEs              JOINT REPLACEMENT     Left knee   LEFT HEART CATH AND CORS/GRAFTS ANGIOGRAPHY N/A 03/19/2020   Procedure:  LEFT HEART CATH AND CORS/GRAFTS ANGIOGRAPHY;  Surgeon: Dann Candyce RAMAN, MD;  Location: [facility] INVASIVE CV LAB;  Service: Cardiovascular;  Laterality: N/A;   LEFT HEART CATHETERIZATION WITH CORONARY/GRAFT ANGIOGRAM N/A 12/21/2011   Procedure: LEFT HEART CATHETERIZATION WITH EL BILE;  Surgeon: Alm ORN Clay, MD;  Location: [facility] CATH LAB;  Service: Cardiovascular;  Laterality: N/A;   NM MYOCAR PERF EJECTION FRACTION  09/22/2009, 07/03/2007   the post stress myocardial perfusion images show a normal pattern of perfusion is all regions. The post-stress ejection fraction is 68 %. no significant wall motion abnormalities noted. This is a low risk scan.   PERIPHERAL VASCULAR INTERVENTION  08/22/2017   Procedure: PERIPHERAL VASCULAR INTERVENTION;  Surgeon: Serene Gaile ORN, MD;  Location: [facility] INVASIVE CV LAB;  Service: Cardiovascular;;  Fem/Fem Graft   REPLACEMENT TOTAL KNEE  05-2011   UNILATERAL UPPER EXTREMEITY ANGIOGRAM Left 11/15/2011   Procedure: UNILATERAL UPPER VENITA BILE;  Surgeon: Dorn JINNY Lesches, MD;  Location: [facility] CATH LAB;  Service: Cardiovascular;  Laterality: Left;   Social History:  reports that she quit smoking about 41 years ago. Her smoking use included cigarettes. She started smoking about 44 years ago. She has never used smokeless tobacco.  She reports that she does not currently use alcohol. She reports that she does not use drugs.  Allergies  Allergen Reactions   Lidocaine  Swelling and Other (See Comments)    Use CORRECT dose for age/weight- red welts and tongue swelling result, if not   Cymbalta  [Duloxetine  Hcl] Other (See Comments)    Increased confusion and memory concerns    Donepezil Other (See Comments)    Altered mood and Aggression     Mobic [Meloxicam] Other (See Comments)    Heart failure- cannot take   Namenda [Memantine] Other (See Comments)    Severe aggression   Nsaids Other (See Comments)    Cannot have due to heart issues   Other Other  (See Comments)    No salt or anything that might cause fluid retention/swelling!!   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, however(??)   Vicodin [Hydrocodone-Acetaminophen ] Other (See Comments)    Delirium, Confusion, and Toxic dementia    Family History  Problem Relation Age of Onset   Heart attack Mother    Heart disease Mother        before age 90   Diabetes Father    Heart disease Father    Hypertension Father    Hyperlipidemia Father    Heart attack Father 59   Heart attack Brother 78   Cerebral palsy Sister 18   Congestive Heart Failure Sister 74   Heart attack Sister 52   Hypertension Sister 26   Dementia Sister    Anxiety disorder Sister    Anxiety disorder Sister 69   Heart Problems Sister    Stroke Sister    Heart Problems Sister 64   Heart attack Sister 12   Colon cancer Brother 31   Prostate cancer Brother 32   Hypertension Daughter    Irritable bowel syndrome Daughter    Depression Daughter    Anxiety disorder Daughter     Prior to Admission medications   Medication Sig Start Date End Date Taking? Authorizing Provider  ALPRAZolam  (XANAX ) 0.5 MG tablet Take 1 tablet (0.5 mg total) by mouth daily as needed for anxiety. 08/15/23  Yes Franchot Raisin, PMHNP  aspirin  EC 81 MG tablet Take 81 mg by mouth at bedtime.   Yes [provider]  busPIRone  (BUSPAR ) 15 MG tablet Take 1 tablet (15 mg total) by mouth 2 (two) times daily. 11/16/22 08/08/48 Yes Franchot Raisin, PMHNP  Cholecalciferol  (VITAMIN D3) 125 MCG (5000 UT) CAPS Take 5,000 Units by mouth at bedtime.   Yes [provider]  clopidogrel  (PLAVIX ) 75 MG tablet TAKE 1 TABLET(75 MG) BY MOUTH DAILY Patient taking differently: Take 75 mg by mouth at bedtime. 02/27/23  Yes Eubanks, Jessica K, NP  Cyanocobalamin  (VITAMIN B-12 PO) Take 1 tablet by mouth in the morning.   Yes [provider]  divalproex  (DEPAKOTE  ER) 250 MG 24 hr  tablet Take 1 tablet (250 mg total) by mouth at bedtime. 11/16/22  Yes Franchot Raisin, PMHNP  dorzolamide  (TRUSOPT ) 2 % ophthalmic solution Place 1 drop into both eyes in the morning and at bedtime.   Yes [provider]  escitalopram  (LEXAPRO ) 20 MG tablet Take 1 tablet (20 mg total) by mouth daily. 11/16/22  Yes Franchot Raisin, PMHNP  gabapentin  (NEURONTIN ) 100 MG capsule TAKE 2 CAPSULES(200 MG) BY MOUTH DAILY Patient taking differently: Take 300 mg by mouth in the morning. 08/09/22  Yes Caro Raisin POUR, NP  ketoconazole  (  NIZORAL ) 2 % shampoo APPLY 10ML TOPICALLY 2 TIMES A WEEK. Patient taking differently: Apply 10 mLs topically 2 (two) times a week. 11/03/22  Yes Caro [name] POUR, NP  levothyroxine  (SYNTHROID ) 175 MCG tablet TAKE 1 TABLET BY MOUTH DAILY AT THE SAME TIME AND SEPARATE FROM ALL OTHER MEDICATIONS Patient taking differently: Take 175 mcg by mouth See admin instructions. Take 175 mcg by mouth in the morning before breakfast- SEPARATE FROM ALL OTHER MEDICATIONS 02/13/23  Yes Caro [name] POUR, NP  metoprolol  succinate (TOPROL -XL) 25 MG 24 hr tablet Take 25 mg by mouth daily.   Yes [provider]  nitroGLYCERIN  (NITROSTAT ) 0.4 MG SL tablet Place 1 tablet (0.4 mg total) under the tongue every 5 (five) minutes x 3 doses as needed for chest pain. 05/20/21  Yes Ngetich, Dinah C, NP  rosuvastatin  (CRESTOR ) 40 MG tablet TAKE 1 TABLET(40 MG) BY MOUTH DAILY Patient taking differently: Take 40 mg by mouth at bedtime. 06/26/23  Yes Eubanks, Jessica K, NP  TYLENOL  500 MG tablet Take 1,000 mg by mouth in the morning.   Yes [provider]  ezetimibe  (ZETIA ) 10 MG tablet Take 10 mg by mouth daily.    [provider]  furosemide  (LASIX ) 40 MG tablet TAKE 1 TABLET(40 MG) BY MOUTH DAILY Patient taking differently: Take 40 mg by mouth in the morning. 06/03/22   Eubanks, Jessica K, NP  meclizine  (ANTIVERT ) 25 MG tablet Take 1 tablet (25 mg total) by mouth as needed  for dizziness. 11/30/18   Caro [name] POUR, NP  pantoprazole  (PROTONIX ) 40 MG tablet TAKE 1 TABLET EVERY DAY 09/20/21   Caro [name] POUR, NP    Physical Exam: Vitals:   08/16/23 1715 08/16/23 1740 08/16/23 1745 08/16/23 2114  BP: 110/82 122/82 (!) 109/56 (!) 177/90  Pulse: 97 99 (!) 101 (!) 103  Resp: 16 15 15 16   Temp:    98.9 F (37.2 C)  TempSrc:    Oral  SpO2: 93% 95% 93% 95%  Weight:      Height:       General: Pleasant, well-appearing elderly woman laying in bed. No acute distress. HEENT: [place]/AT. Anicteric sclera CV: RRR. No murmurs, rubs, or gallops. No LE edema Pulmonary: Lungs CTAB. Normal effort. No wheezing or rales. Abdominal: Soft, nontender, nondistended. Normal bowel sounds. Extremities: No ttp of the left shoulder. Limited ROM of the left shoulder due to pain.  Skin: Warm and dry. No obvious rash or lesions. Neuro: A&Ox3. Moves all extremities. Normal sensation to light touch. No focal deficit. 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  # Acute cystitis Elderly patient with history of mild cognitive deficits and likely undiagnosed  dementia presented with recent behavioral  and mental status change and found to have evidence of infection on UA.  She does endorse recent frequent urination but no dysuria. -Continue IV Rocephin  -Follow-up urine culture -Trend CBC, fever curve  # TBI s/p mild cognitive deficits # Dementia? Per daughter, patient has had intermittent confusion as well as recent behavior change over the last 2 weeks.  She has been wandering through her neighbors house and also making accusations that her daughter is trying to kill her.  She lives with patient at home and unsure if she can safely take care of patient with progression of her cognitive deficits. 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 Vascular surgery consulted and noted that all these findings are chronic. Vascular insufficiency not the culprit for her left shoulder pain. -Vascular surgery following, appreciate -Continue Plavix , rosuvastatin  and aspirin   # Chronic left shoulder pain Recent imaging of the left shoulder 2 months ago showed osteoarthritis of the shoulder with 2 cm loose body in the axillary pouch. Patient recently received injection to the shoulder by orthopedic surgery. She continues to endorse left shoulder pain worse with lifting of the arm. -Scheduled Tylenol  1000 mg 3 times daily -Apply Voltaren  gel to the left shoulder -Follow-up with orthopedic surgery as scheduled on Friday  # Chronic combined systolic and systolic HF Patient euvolemic on exam. -Continue Lasix  40 mg every other day  # HTN BP elevated with SBP in the 140s to 170s. -Continue Toprol  XL 25 mg daily  # CAD #  HLD Lipid panel 2 months ago showed LDL of 40 -Continue Plavix , ASA, Zetia  and rosuvastatin   # Hypothyroidism TSH borderline low at 0.41 2 months ago -Continue on Synthroid  -Follow-up repeat TSH  # Anxiety and depression -Continue Lexapro , Depakote , BuSpar  and as needed Xanax   # GERD -Continue Protonix    Advance Care Planning:   Code Status: Full Code   Consults: Vascular surgery  Family Communication: Discussed admission with daughter on the phone  Severity of Illness: The appropriate patient status for this patient is OBSERVATION. Observation status is judged to be reasonable and necessary in order to provide the required intensity of service to ensure the patient's safety. The patient's presenting symptoms, physical exam findings, and initial radiographic and laboratory data in the context of their medical condition is felt to place them at decreased risk for further clinical deterioration. Furthermore, it is anticipated that the patient will be medically stable for discharge from the hospital within 2 midnights of admission.   Author: Claretta CHRISTELLA Alderman, MD 08/16/2023 9:36 PM  For on call review [email or web address].

## 2023-08-16 NOTE — ED Triage Notes (Signed)
 Pt arrives via EMS from home with arm pain. Pt lives at home with daughter who is also deciding about nursing home placement. Pt has dementia and daughter states she will not wear med alert.

## 2023-08-16 NOTE — ED Notes (Addendum)
 ED TO INPATIENT HANDOFF REPORT  ED Nurse Name and Phone #: Danna Sewell/[phone]  S Name/Age/Gender Deanna [name] 83 y.o. female Room/Bed: WA13/WA13  Code Status   Code Status: Full Code  Home/SNF/Other Home but possible seeking SNF placement Patient oriented to: self, place,dob but forgetful w/ hx of Dementia Is this baseline? Yes   Triage Complete: Triage complete  Chief Complaint UTI (urinary tract infection) [N39.0]  Triage Note Pt arrives via EMS from home with arm pain. Pt lives at home with daughter who is also deciding about nursing home placement. Pt has dementia and daughter states she will not wear med alert.    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    Vicodin [Hydrocodone-Acetaminophen ] Other (See Comments)    Delirium Confusion Toxic dementia    Level of Care/Admitting Diagnosis ED Disposition     ED Disposition  Admit   Condition  --   Comment  Hospital Area: [facility] [[id number]]  Level of Care: Med-Surg [16]  May place patient in observation at [facility] or [facility] if equivalent level of care is available:: No  Covid Evaluation: Asymptomatic - no recent exposure (last 10 days) testing not required  Diagnosis: UTI (urinary tract infection) [781136]  Admitting Physician: LOU CLARETTA HERO [[id number]]  Attending Physician: LOU CLARETTA HERO [[id number]]          B Medical/Surgery History Past Medical History:  Diagnosis Date   Anxiety    Bacteremia, escherichia coli 04/27/2015   Brachial-basilar insufficiency syndrome     CAD (coronary artery disease)    Celiac artery stenosis (HCC)    Chronic bilateral low back pain without sciatica    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   CKD (chronic kidney disease), stage IV (HCC)    Cognitive impairment    Colon polyps    Community acquired pneumonia    Depression    Diverticulosis    Dysuria    Encephalomalacia    GAD (generalized anxiety disorder)    GERD (gastroesophageal reflux disease)    Glaucoma    Hearing loss    Hemorrhoids    Hypertension    Hypothyroidism    Incontinence    Mixed hyperlipidemia    Myocardial infarction (HCC)    NSTEMI (non-ST elevated myocardial infarction) (HCC) 12/19/2011   OSA on CPAP    PAF (paroxysmal atrial fibrillation) (HCC)    Peripheral arterial disease (HCC)    Pneumonitis 04/26/2015   Pyelonephritis due to Escherichia coli 04/28/2015   Sepsis (HCC)    Spondylosis of lumbar region without myelopathy or radiculopathy    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HCC)    Urticaria    Vertigo, benign positional    Past Surgical History:  Procedure Laterality Date   ABDOMINAL AORTOGRAM W/LOWER EXTREMITY N/A 08/22/2017   Procedure: ABDOMINAL AORTOGRAM W/LOWER EXTREMITY;  Surgeon: Serene Gaile ORN, MD;  [facility]: [facility];  Service: Cardiovascular;  Laterality: N/A;   BILATERAL UPPER EXTREMITY ANGIOGRAM N/A 09/11/2012   Procedure: BILATERAL UPPER EXTREMITY ANGIOGRAM;  Surgeon:  Gaile LELON New, MD;  Location: [facility] CATH LAB;  Service: Cardiovascular;  Laterality: N/A;   BIOPSY  03/25/2020   Procedure: BIOPSY;  Surgeon: Wilhelmenia Aloha Raddle., MD;  Location: [facility] ENDOSCOPY;  Service: Gastroenterology;;   carotid duplex doppler Bilateral 09/03/2012, 11/03/2011   Evidence of 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 patent left common  carotid- subclavian artery bypass with turbulent flow noted at the anastomosis with velocities of 295 cm/s   CAROTID-SUBCLAVIAN BYPASS GRAFT  12/15/2011   Procedure: BYPASS GRAFT CAROTID-SUBCLAVIAN;  Surgeon: Gaile LELON New, MD;  Location: [facility] OR;  Service: Vascular;  Laterality: Left;  Left Carotid subclavian bypass   CORONARY ANGIOPLASTY     CORONARY ARTERY BYPASS GRAFT     CORONARY ATHERECTOMY N/A 03/19/2020   Procedure: CORONARY ATHERECTOMY;  Surgeon: Dann Candyce RAMAN, MD;  Location: [facility] INVASIVE CV LAB;  Service: Cardiovascular;  Laterality: N/A;   CORONARY STENT INTERVENTION N/A 03/19/2020   Procedure: CORONARY STENT INTERVENTION;  Surgeon: Dann Candyce RAMAN, MD;  Location: [facility] INVASIVE CV LAB;  Service: Cardiovascular;  Laterality: N/A;   DOPPLER ECHOCARDIOGRAPHY  05/27/2010, 09/17/2008   Mild Proximal septal thickening is noted. Left ventricular systolic functions is normal ejection fraction =>55%. the aortic valve appears to be mildly sclerotic    ESOPHAGOGASTRODUODENOSCOPY (EGD) WITH PROPOFOL  N/A 03/25/2020   Procedure: ESOPHAGOGASTRODUODENOSCOPY (EGD) WITH PROPOFOL ;  Surgeon: Wilhelmenia Aloha Raddle., MD;  Location: [facility] ENDOSCOPY;  Service: Gastroenterology;  Laterality: N/A;   fem-fem bypass graft  1999   holter monitor  01/21/2008   The predominant rhythm was normal sinus rhythm. Minimum heartrate of 63 bpm at +01:00, maximum heartrate of 105 bpm at + 10:35; and the average heartrate of 75 bpm. Ventricular ectopic activity totaled 1270: Multifocal; 866-PVC's and 404-VEs              JOINT REPLACEMENT     Left knee   LEFT HEART CATH AND CORS/GRAFTS ANGIOGRAPHY N/A 03/19/2020   Procedure: LEFT HEART CATH AND CORS/GRAFTS ANGIOGRAPHY;  Surgeon: Dann Candyce RAMAN, MD;  Location: [facility] CV LAB;  Service: Cardiovascular;  Laterality: N/A;   LEFT HEART CATHETERIZATION WITH CORONARY/GRAFT ANGIOGRAM N/A 12/21/2011   Procedure: LEFT HEART CATHETERIZATION WITH EL BILE;   Surgeon: Alm LELON Clay, MD;  Location: [facility] CATH LAB;  Service: Cardiovascular;  Laterality: N/A;   NM MYOCAR PERF EJECTION FRACTION  09/22/2009, 07/03/2007   the post stress myocardial perfusion images show a normal pattern of perfusion is all regions. The post-stress ejection fraction is 68 %. no significant wall motion abnormalities noted. This is a low risk scan.   PERIPHERAL VASCULAR INTERVENTION  08/22/2017   Procedure: PERIPHERAL VASCULAR INTERVENTION;  Surgeon: New Gaile LELON, MD;  Location: [facility] INVASIVE CV LAB;  Service: Cardiovascular;;  Fem/Fem Graft   REPLACEMENT TOTAL KNEE  05-2011   UNILATERAL UPPER EXTREMEITY ANGIOGRAM Left 11/15/2011   Procedure: UNILATERAL UPPER VENITA BILE;  Surgeon: Dorn JINNY Lesches, MD;  Location: [facility] CATH LAB;  Service: Cardiovascular;  Laterality: Left;     A IV Location/Drains/Wounds Patient Lines/Drains/Airways Status     Active Line/Drains/Airways     Name Placement date Placement time Site Days   Peripheral IV 08/16/23 20 G Right Antecubital 08/16/23  1425  Antecubital  less than 1            Intake/Output Last 24 hours No intake or output data in the 24 hours ending 08/16/23 2017  Labs/Imaging Results for orders placed or performed during the hospital encounter of 08/16/23 (from the past 48 hours)  CBC with Differential     Status: None   Collection Time: 08/16/23  1:58 PM  Result Value Ref Range   WBC 5.4 4.0 - 10.5 K/uL   RBC 4.37 3.87 - 5.11 MIL/uL   Hemoglobin 12.9 12.0 - 15.0 g/dL   HCT 61.4 63.9 - 53.9 %   MCV 88.1 80.0 - 100.0 fL   MCH 29.5 26.0 - 34.0 pg   MCHC 33.5 30.0 - 36.0 g/dL   RDW 85.6 88.4 - 84.4 %   Platelets 152 150 - 400 K/uL   nRBC 0.0 0.0 - 0.2 %   Neutrophils Relative % 58 %   Neutro Abs 3.2 1.7 - 7.7 K/uL   Lymphocytes Relative 34 %   Lymphs Abs 1.8 0.7 - 4.0 K/uL   Monocytes Relative 7 %   Monocytes Absolute 0.4 0.1 - 1.0 K/uL   Eosinophils Relative 1 %   Eosinophils Absolute 0.1 0.0 - 0.5 K/uL    Basophils Relative 0 %   Basophils Absolute 0.0 0.0 - 0.1 K/uL   Immature Granulocytes 0 %   Abs Immature Granulocytes 0.02 0.00 - 0.07 K/uL    Comment: Performed at [facility], [address] [place], [place] [address]  Comprehensive metabolic panel     Status: Abnormal   Collection Time: 08/16/23  1:58 PM  Result Value Ref Range   Sodium 137 135 - 145 mmol/L   Potassium 4.2 3.5 - 5.1 mmol/L   Chloride 103 98 - 111 mmol/L   CO2 25 22 - 32 mmol/L   Glucose, Bld 100 (H) 70 - 99 mg/dL    Comment: Glucose reference range applies only to samples taken after fasting for at least 8 hours.   BUN 20 8 - 23 mg/dL   Creatinine, Ser 8.91 (H) 0.44 - 1.00 mg/dL   Calcium  9.1 8.9 - 10.3 mg/dL   Total Protein 6.8 6.5 - 8.1 g/dL   Albumin  4.0 3.5 - 5.0 g/dL   AST 19 15 - 41 U/L   ALT 28 0 - 44 U/L   Alkaline Phosphatase 63 38 - 126 U/L   Total Bilirubin 1.9 (H) 0.0 - 1.2 mg/dL   GFR, Estimated 52 (L) >60 mL/min    Comment: (NOTE) Calculated using the CKD-EPI Creatinine Equation (2021)    Anion gap 9 5 - 15    Comment: Performed at [facility], [address], [place], [place] [address]  Troponin I (High Sensitivity)     Status: Abnormal   Collection Time: 08/16/23  1:58 PM  Result Value Ref Range   Troponin I (High Sensitivity) 22 (H) <18 ng/L    Comment: (NOTE) Elevated high sensitivity troponin I (hsTnI) values and significant  changes across serial measurements may suggest ACS but many other  chronic and acute conditions are known to elevate hsTnI results.  Refer to the Links section for chest pain algorithms and additional  guidance. Performed at [facility], [address], [place], [place] [address]   I-stat chem 8, ED (not at [facility], [facility] or [facility])     Status: Abnormal   Collection Time: 08/16/23  2:08 PM  Result Value Ref Range   Sodium 138 135 - 145 mmol/L   Potassium 4.3 3.5 - 5.1 mmol/L   Chloride 102 98 - 111  mmol/L   BUN 18 8 - 23 mg/dL  Creatinine, Ser 1.30 (H) 0.44 - 1.00 mg/dL   Glucose, Bld 92 70 - 99 mg/dL    Comment: Glucose reference range applies only to samples taken after fasting for at least 8 hours.   Calcium , Ion 1.20 1.15 - 1.40 mmol/L   TCO2 25 22 - 32 mmol/L   Hemoglobin 13.3 12.0 - 15.0 g/dL   HCT 60.9 63.9 - 53.9 %  Troponin I (High Sensitivity)     Status: Abnormal   Collection Time: 08/16/23  3:35 PM  Result Value Ref Range   Troponin I (High Sensitivity) 21 (H) <18 ng/L    Comment: (NOTE) Elevated high sensitivity troponin I (hsTnI) values and significant  changes across serial measurements may suggest ACS but many other  chronic and acute conditions are known to elevate hsTnI results.  Refer to the Links section for chest pain algorithms and additional  guidance. Performed at [facility], [address], [place], [place] [address]   Urinalysis, w/ Reflex to Culture (Infection Suspected) -Urine, Clean Catch     Status: Abnormal   Collection Time: 08/16/23  4:13 PM  Result Value Ref Range   Specimen Source URINE, CLEAN CATCH    Color, Urine STRAW (A) YELLOW   APPearance CLEAR CLEAR   Specific Gravity, Urine 1.032 (H) 1.005 - 1.030   pH 7.0 5.0 - 8.0   Glucose, UA NEGATIVE NEGATIVE mg/dL   Hgb urine dipstick NEGATIVE NEGATIVE   Bilirubin Urine NEGATIVE NEGATIVE   Ketones, ur NEGATIVE NEGATIVE mg/dL   Protein, ur NEGATIVE NEGATIVE mg/dL   Nitrite NEGATIVE NEGATIVE   Leukocytes,Ua MODERATE (A) NEGATIVE   RBC / HPF 0-5 0 - 5 RBC/hpf   WBC, UA 21-50 0 - 5 WBC/hpf    Comment:        Reflex urine culture not performed if WBC <=10, OR if Squamous epithelial cells >5. If Squamous epithelial cells >5 suggest recollection.    Bacteria, UA NONE SEEN NONE SEEN   Squamous Epithelial / HPF 0-5 0 - 5 /HPF   WBC Clumps PRESENT     Comment: Performed at [facility], [address], [place], [place] [address]  I-Stat CG4 Lactic  Acid     Status: None   Collection Time: 08/16/23  5:29 PM  Result Value Ref Range   Lactic Acid, Venous 0.6 0.5 - 1.9 mmol/L   CT ANGIO UP EXTREM LEFT W &/OR WO CONTAST Result Date: 08/16/2023 CLINICAL DATA:  Pain.  Vascular murmur. EXAM: CT ANGIOGRAPHY UPPER LEFT EXTREMITY TECHNIQUE: CT angiogram imaging of the left upper extremity was performed after the administration of intravenous contrast. Coronal and sagittal reformats were submitted. MIP reformats were submitted as well. CONTRAST:  OMNIPAQUE  IOHEXOL  350 MG/ML SOLN COMPARISON:  None Available. FINDINGS: Arterial: There severe stenosis/occlusion of the proximal 3.5 cm of the left subclavian artery secondary to calcified atherosclerotic disease. The distal subclavian, axillary, brachial, ulnar, and radial arteries appear patent to the level of the wrist. There is no dissection or aneurysm. There is also severe stenosis of the origin of the left common carotid artery near the arch of the aorta. There also severe atherosclerotic calcifications of the distal left common carotid artery. Note is made of the accessory arterial branch extending from the patent portion of the left common carotid artery to the mid left subclavian artery. There is severe focal stenosis of the celiac artery, but visualized branches are otherwise within normal limits. Osseous: Degenerative changes are seen in the  glenohumeral joint with joint space narrowing and osteophyte formation. No acute fracture or focal osseous lesion identified. Soft tissues: No evidence for soft tissue infection, hematoma or gas. There soft tissue calcifications along the posteroinferior margin of the glenoid. There severe degenerative changes of the spine. Others: Subcentimeter renal cysts are present on the left. Colonic diverticula are present. Review of the MIP images confirms the above findings. IMPRESSION: 1. 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 2. 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 3. Severe focal stenosis of the celiac artery. 4. Colonic diverticulosis. Electronically Signed   By: Greig Pique M.D.   On: 08/16/2023 16:36   CT Head Wo Contrast Result Date: 08/16/2023 CLINICAL DATA:  Delirium. Asymmetric upper extremity pulses, less prominent on the left. Left arm and shoulder pain. EXAM: CT HEAD WITHOUT CONTRAST TECHNIQUE: Contiguous axial images were obtained from the base of the skull through the vertex without intravenous contrast. RADIATION DOSE REDUCTION: This exam was performed according to the departmental dose-optimization program which includes automated exposure control, adjustment of the mA and/or kV according to patient size and/or use of iterative reconstruction technique. COMPARISON:  MR head without contrast 11/16/2016 FINDINGS: Brain: Mild atrophy and moderate periventricular white matter hypoattenuation demonstrates continued progression. Deep brain nuclei are within normal limits. No acute or focal cortical infarct is present. No acute hemorrhage or mass lesion is present. The ventricles are proportionate to the degree of atrophy. No significant extraaxial fluid collection is present. The brainstem and cerebellum are within normal limits. Midline structures are within normal limits. Vascular: Minimal calcifications are present within the cavernous internal carotid arteries. No hyperdense vessel is present. Skull: Calvarium is intact. No focal lytic or blastic lesions are present. No significant extracranial soft tissue lesion is present. Sinuses/Orbits: The paranasal sinuses and mastoid air cells are clear. Bilateral lens replacements are noted. Globes and orbits are otherwise unremarkable. IMPRESSION: 1. No acute intracranial abnormality or significant interval change. 2. Mild atrophy and moderate periventricular white matter disease demonstrates continued progression.  This likely reflects the sequela of chronic microvascular ischemia. Electronically Signed   By: Lonni Necessary M.D.   On: 08/16/2023 15:19    Pending Labs Unresulted Labs (From admission, onward)     Start     Ordered   08/16/23 1613  Urine Culture  Once,   R        08/16/23 1613            Vitals/Pain Today's Vitals   08/16/23 1710 08/16/23 1715 08/16/23 1740 08/16/23 1745  BP: 110/87 110/82 122/82 (!) 109/56  Pulse: 97 97 99 (!) 101  Resp: 19 16 15 15   Temp:      TempSrc:      SpO2: 95% 93% 95% 93%  Weight:      Height:        Isolation Precautions No active isolations  Medications Medications  enoxaparin  (LOVENOX ) injection 40 mg (has no administration in time range)  senna-docusate (Senokot-S) tablet 1 tablet (has no administration in time range)  ondansetron  (ZOFRAN ) tablet 4 mg (has no administration in time range)    Or  ondansetron  (ZOFRAN ) injection 4 mg (has no administration in time range)  iohexol  (OMNIPAQUE ) 350 MG/ML injection 100 mL (100 mLs Intravenous Contrast Given 08/16/23 1454)  cefTRIAXone  (ROCEPHIN ) 2 g in sodium chloride  0.9 % 100 mL IVPB (0 g Intravenous Stopped 08/16/23 1804)    Mobility walks  with walker     R Report given to:

## 2023-08-16 NOTE — Telephone Encounter (Signed)
Patient in the ED  °

## 2023-08-16 NOTE — ED Notes (Signed)
 Pt urinated on herself, NT took pt to bathroom, Writer changed linen, pt resting in bed.

## 2023-08-16 NOTE — H&P (Signed)
 Hospital Consult    Reason for Consult: Concern for left upper extremity ischemia Requesting Physician: ED MRN #:  [id number]  History of Present Illness: This is a 83 y.o. 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  She was lost to follow-up in 2019.  Patient presented with a history of waxing waning left arm pain, predominantly at the level of the shoulder.  Imaging was obtained demonstrating left carotid subclavian bypass.  Appear to be ostial stenosis of the carotid.  Or some concern that the pain was due to ischemia  On exam, Deanna [name] was newly demented she was not sure where she was.  She was able to answer questions regarding her health in terms of pain.  She noted significant shoulder pain that waxes and wanes.  Denies any hand pain, numbness.  States she can walk around fine without any leg pain.  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   CKD (chronic kidney disease), stage IV (HCC)    Cognitive impairment    Colon polyps    Community acquired pneumonia    Depression    Diverticulosis    Dysuria    Encephalomalacia    GAD (generalized anxiety disorder)    GERD (gastroesophageal reflux disease)    Glaucoma    Hearing loss    Hemorrhoids    Hypertension    Hypothyroidism    Incontinence    Mixed hyperlipidemia    Myocardial infarction (HCC)    NSTEMI (non-ST elevated myocardial infarction) (HCC) 12/19/2011   OSA on CPAP    PAF (paroxysmal atrial fibrillation) (HCC)    Peripheral arterial disease (HCC)    Pneumonitis 04/26/2015   Pyelonephritis  due to Escherichia coli 04/28/2015   Sepsis (HCC)    Spondylosis of lumbar region without myelopathy or radiculopathy    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HCC)    Urticaria    Vertigo, benign positional     Past Surgical History:  Procedure Laterality Date   ABDOMINAL AORTOGRAM W/LOWER EXTREMITY N/A 08/22/2017   Procedure: ABDOMINAL AORTOGRAM W/LOWER EXTREMITY;  Surgeon: Serene Gaile ORN, MD;  Location: [facility] INVASIVE CV LAB;  Service: Cardiovascular;  Laterality: N/A;   BILATERAL UPPER EXTREMITY ANGIOGRAM N/A 09/11/2012   Procedure: BILATERAL UPPER EXTREMITY ANGIOGRAM;  Surgeon: Gaile ORN Serene, MD;  Location: [facility] CATH LAB;  Service: Cardiovascular;  Laterality: N/A;   BIOPSY  03/25/2020   Procedure: BIOPSY;  Surgeon: Wilhelmenia Aloha Raddle., MD;  Location: [facility] ENDOSCOPY;  Service: Gastroenterology;;   carotid duplex doppler Bilateral 09/03/2012, 11/03/2011   Evidence of 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 patent left common carotid- subclavian artery bypass with turbulent flow noted at the anastomosis with velocities of 295 cm/s   CAROTID-SUBCLAVIAN BYPASS GRAFT  12/15/2011   Procedure: BYPASS GRAFT CAROTID-SUBCLAVIAN;  Surgeon: Gaile ORN Serene, MD;  Location: [facility] OR;  Service: Vascular;  Laterality: Left;  Left Carotid subclavian bypass   CORONARY ANGIOPLASTY     CORONARY ARTERY BYPASS GRAFT     CORONARY ATHERECTOMY N/A 03/19/2020   Procedure: CORONARY  ATHERECTOMY;  Surgeon: Dann Candyce RAMAN, MD;  Location: [facility] INVASIVE CV LAB;  Service: Cardiovascular;  Laterality: N/A;   CORONARY STENT INTERVENTION N/A 03/19/2020   Procedure: CORONARY STENT INTERVENTION;  Surgeon: Dann Candyce RAMAN, MD;  Location: [facility] INVASIVE CV LAB;  Service: Cardiovascular;  Laterality: N/A;   DOPPLER ECHOCARDIOGRAPHY  05/27/2010, 09/17/2008   Mild Proximal septal thickening is  noted. Left ventricular systolic functions is normal ejection fraction =>55%. the aortic valve appears to be mildly sclerotic    ESOPHAGOGASTRODUODENOSCOPY (EGD) WITH PROPOFOL  N/A 03/25/2020   Procedure: ESOPHAGOGASTRODUODENOSCOPY (EGD) WITH PROPOFOL ;  Surgeon: Wilhelmenia Aloha Raddle., MD;  Location: [facility] ENDOSCOPY;  Service: Gastroenterology;  Laterality: N/A;   fem-fem bypass graft  1999   holter monitor  01/21/2008   The predominant rhythm was normal sinus rhythm. Minimum heartrate of 63 bpm at +01:00, maximum heartrate of 105 bpm at + 10:35; and the average heartrate of 75 bpm. Ventricular ectopic activity totaled 1270: Multifocal; 866-PVC's and 404-VEs              JOINT REPLACEMENT     Left knee   LEFT HEART CATH AND CORS/GRAFTS ANGIOGRAPHY N/A 03/19/2020   Procedure: LEFT HEART CATH AND CORS/GRAFTS ANGIOGRAPHY;  Surgeon: Dann Candyce RAMAN, MD;  Location: [facility] INVASIVE CV LAB;  Service: Cardiovascular;  Laterality: N/A;   LEFT HEART CATHETERIZATION WITH CORONARY/GRAFT ANGIOGRAM N/A 12/21/2011   Procedure: LEFT HEART CATHETERIZATION WITH EL BILE;  Surgeon: Alm LELON Clay, MD;  Location: [facility] CATH LAB;  Service: Cardiovascular;  Laterality: N/A;   NM MYOCAR PERF EJECTION FRACTION  09/22/2009, 07/03/2007   the post stress myocardial perfusion images show a normal pattern of perfusion is all regions. The post-stress ejection fraction is 68 %. no significant wall motion abnormalities noted. This is a low risk scan.   PERIPHERAL VASCULAR INTERVENTION  08/22/2017   Procedure: PERIPHERAL VASCULAR INTERVENTION;  Surgeon: Serene Gaile LELON, MD;  Location: [facility] INVASIVE CV LAB;  Service: Cardiovascular;;  Fem/Fem Graft   REPLACEMENT TOTAL KNEE  05-2011   UNILATERAL UPPER EXTREMEITY ANGIOGRAM Left 11/15/2011   Procedure: UNILATERAL UPPER VENITA BILE;  Surgeon: Dorn JINNY Lesches, MD;  Location: [facility] CATH LAB;  Service: Cardiovascular;  Laterality: Left;    Allergies  Allergen Reactions    Lidocaine  Swelling and Other (See Comments)    Use CORRECT dose for age/weight- red welts and tongue swelling result, if not   Cymbalta  [Duloxetine  Hcl] Other (See Comments)    Increased confusion and memory concerns    Donepezil Other (See Comments)    Altered mood and Aggression     Mobic [Meloxicam] Other (See Comments)    Heart failure   Namenda [Memantine] Other (See Comments)    Severe aggression   Nsaids Other (See Comments)    Cannot have due to heart issues   Other Other (See Comments)    No salt or anything   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 Authorizing Provider  acetaminophen  (TYLENOL ) 325 MG tablet Take 325 mg by mouth 3 (three) times daily as needed for mild pain or headache.    [provider]  ALPRAZolam  (XANAX ) 0.5 MG tablet Take 1 tablet (0.5 mg total) by mouth daily as needed for anxiety. 08/15/23  Yes Franchot Raisin,  PMHNP  aspirin  EC 81 MG tablet Take 81 mg by mouth at bedtime.    [provider]  busPIRone  (BUSPAR ) 15 MG tablet Take 1 tablet (15 mg total) by mouth 2 (two) times daily. 11/16/22 08/08/48  Franchot Raisin, PMHNP  Cholecalciferol  (VITAMIN D3) 125 MCG (5000 UT) CAPS Take 5,000 Units by mouth at bedtime.    [provider]  clopidogrel  (PLAVIX ) 75 MG tablet TAKE 1 TABLET(75 MG) BY MOUTH DAILY Patient taking differently: Take 75 mg by mouth daily. 02/27/23   Eubanks, Jessica K, NP  Cyanocobalamin  (VITAMIN B-12 PO) Take 1 tablet by mouth in the morning.    [provider]  divalproex  (DEPAKOTE  ER) 250 MG 24 hr tablet Take 1 tablet (250 mg total) by mouth at bedtime. 11/16/22   Franchot Raisin, PMHNP  dorzolamide  (TRUSOPT ) 2 % ophthalmic solution Place 1 drop into  both eyes 2 (two) times daily.    [provider]  escitalopram  (LEXAPRO ) 20 MG tablet Take 1 tablet (20 mg total) by mouth daily. 11/16/22   Franchot Raisin, PMHNP  ezetimibe  (ZETIA ) 10 MG tablet Take 10 mg by mouth daily.    [provider]  furosemide  (LASIX ) 40 MG tablet TAKE 1 TABLET(40 MG) BY MOUTH DAILY Patient taking differently: Take 40 mg by mouth in the morning. 06/03/22   Caro Raisin POUR, NP  gabapentin  (NEURONTIN ) 100 MG capsule TAKE 2 CAPSULES(200 MG) BY MOUTH DAILY 08/09/22   Eubanks, Jessica K, NP  ketoconazole  (NIZORAL ) 2 % shampoo APPLY 10ML TOPICALLY 2 TIMES A WEEK. Patient taking differently: Apply 10 mLs topically 2 (two) times a week. 11/03/22   Caro Raisin POUR, NP  latanoprost  (XALATAN ) 0.005 % ophthalmic solution Place 1 drop into both eyes at bedtime.    [provider]  levothyroxine  (SYNTHROID ) 175 MCG tablet TAKE 1 TABLET BY MOUTH DAILY AT THE SAME TIME AND SEPARATE FROM ALL OTHER MEDICATIONS 02/13/23   Caro Raisin POUR, NP  meclizine  (ANTIVERT ) 25 MG tablet Take 1 tablet (25 mg total) by mouth as needed for dizziness. 11/30/18   Caro Raisin POUR, NP  metoprolol  succinate (TOPROL -XL) 25 MG 24 hr tablet Take 25 mg by mouth daily.    [provider]  mirabegron  ER (MYRBETRIQ ) 50 MG TB24 tablet Take 50 mg by mouth in the morning.    [provider]  nitroGLYCERIN  (NITROSTAT ) 0.4 MG SL tablet Place 1 tablet (0.4 mg total) under the tongue every 5 (five) minutes x 3 doses as needed for chest pain. 05/20/21   Ngetich, Dinah C, NP  pantoprazole  (PROTONIX ) 40 MG tablet TAKE 1 TABLET EVERY DAY 09/20/21   Eubanks, Jessica K, NP  rosuvastatin  (CRESTOR ) 40 MG tablet TAKE 1 TABLET(40 MG) BY MOUTH DAILY 06/26/23   Caro Raisin POUR, NP    Social History   Socioeconomic History   Marital status: Divorced    Spouse name: Not on file   Number of children: Not on file   Years of education: Not on file   Highest education level: Not on  file  Occupational History   Not on file  Tobacco Use   Smoking status: Former    Current packs/day: 0.00    Types: Cigarettes    Start date: 11/28/1978    Quit date: 11/27/1981    Years since quitting: 41.7   Smokeless tobacco: Never  Vaping Use   Vaping status: Never Used  Substance and Sexual Activity   Alcohol use: Not Currently    Alcohol/week: 0.0 standard  drinks of alcohol   Drug use: No   Sexual activity: Not Currently    Comment: 1st intercourse 61 yo-1 partner  Other Topics Concern   Not on file  Social History Narrative   Social History      Diet? Healthy but too many sweets      Do you drink/eat things with caffeine? Yes occasionally      Marital status?                    D                What year were you married?      Do you live in a house, apartment, assisted living, condo, trailer, etc.? condo      Is it one or more stories? 1      How many persons live in your home? 2      Do you have any pets in your home? (please list) 1 cat      Highest level of education completed? 1 year college      Current or past profession: housewife      Do you exercise?           no                           Type & how often?      Advanced Directives      Do you have a living will? no      Do you have a DNR form?             no                     If not, do you want to discuss one? yes      Do you have signed POA/HPOA for forms? no      Functional Status      Do you have difficulty bathing or dressing yourself? No- gets tired      Do you have difficulty preparing food or eating? No- eats frozen entrees or poor nutrition meals      Do you have difficulty managing your medications? yes      Do you have difficulty managing your finances? yes      Do you have difficulty affording your medications? No- funds running low   Social Drivers of Corporate Investment Banker Strain: Not on file  Food Insecurity: No Food Insecurity (06/17/2022)   Hunger Vital Sign     Worried About Running Out of Food in the Last Year: Never true    Ran Out of Food in the Last Year: Never true  Transportation Needs: No Transportation Needs (04/24/2023)   Received from [facility] - Transportation    In the past 12 months, has lack of transportation kept you from medical appointments or from getting medications?: No    Lack of Transportation (Non-Medical): No  Physical Activity: Not on file  Stress: Not on file  Social Connections: Not on file  Intimate Partner Violence: Not At Risk (06/17/2022)   Humiliation, Afraid, Rape, and Kick questionnaire    Fear of Current or Ex-Partner: No    Emotionally Abused: No    Physically Abused: No    Sexually Abused: No   Family History  Problem Relation Age of Onset   Heart attack Mother    Heart disease Mother  before age 15   Diabetes Father    Heart disease Father    Hypertension Father    Hyperlipidemia Father    Heart attack Father 78   Heart attack Brother 60   Cerebral palsy Sister 48   Congestive Heart Failure Sister 18   Heart attack Sister 63   Hypertension Sister 61   Dementia Sister    Anxiety disorder Sister    Anxiety disorder Sister 93   Heart Problems Sister    Stroke Sister    Heart Problems Sister 47   Heart attack Sister 32   Colon cancer Brother 20   Prostate cancer Brother 44   Hypertension Daughter    Irritable bowel syndrome Daughter    Depression Daughter    Anxiety disorder Daughter     ROS: Otherwise negative unless mentioned in HPI  Physical Examination  Vitals:   08/16/23 1745 08/16/23 2114  BP: (!) 109/56 (!) 177/90  Pulse: (!) 101 (!) 103  Resp: 15 16  Temp:  98.9 F (37.2 C)  SpO2: 93% 95%   Body mass index is 32.32 kg/m.  General:  WDWN in NAD Gait: Not observed HENT: WNL, normocephalic Pulmonary: normal non-labored breathing, without Rales, rhonchi,  wheezing Cardiac: regular, Abdomen: soft, NT/ND, no masses Skin: without  rashes Vascular Exam/Pulses: Palpable femoral pulses bilaterally, nonpalpable left brachial pulse, however multiphasic signal in the brachial, ulnar hand warm and well-perfused Extremities: without ischemic changes, without Gangrene , without cellulitis; without open wounds;  Musculoskeletal: no muscle wasting or atrophy  Neurologic: A&O X 3;  No focal weakness or paresthesias are detected; speech is fluent/normal Psychiatric:  The pt has Normal affect. Lymph:  Unremarkable  CBC    Component Value Date/Time   WBC 5.4 08/16/2023 1358   RBC 4.37 08/16/2023 1358   HGB 13.3 08/16/2023 1408   HGB 10.2 (L) 04/22/2020 1118   HCT 39.0 08/16/2023 1408   HCT 30.6 (L) 04/22/2020 1118   PLT 152 08/16/2023 1358   PLT 108 (L) 04/22/2020 1118   MCV 88.1 08/16/2023 1358   MCV 86 04/22/2020 1118   MCH 29.5 08/16/2023 1358   MCHC 33.5 08/16/2023 1358   RDW 14.3 08/16/2023 1358   RDW 13.5 04/22/2020 1118   LYMPHSABS 1.8 08/16/2023 1358   MONOABS 0.4 08/16/2023 1358   EOSABS 0.1 08/16/2023 1358   BASOSABS 0.0 08/16/2023 1358    BMET    Component Value Date/Time   NA 138 08/16/2023 1408   NA 143 07/14/2022 0000   K 4.3 08/16/2023 1408   CL 102 08/16/2023 1408   CO2 25 08/16/2023 1358   GLUCOSE 92 08/16/2023 1408   BUN 18 08/16/2023 1408   BUN 14 07/14/2022 0000   CREATININE 1.30 (H) 08/16/2023 1408   CREATININE 1.45 (H) 06/16/2023 1558   CALCIUM  9.1 08/16/2023 1358   GFRNONAA 52 (L) 08/16/2023 1358   GFRNONAA 30 (L) 12/07/2020 1356   GFRAA 35 (L) 12/07/2020 1356    COAGS: Lab Results  Component Value Date   INR 1.2 03/19/2020   INR 1.27 04/26/2015   INR 1.07 12/19/2011     Left upper extremity CT angiogram demonstrates flow throughout the left upper extremity.   ASSESSMENT/PLAN: This is a 83 y.o. female with left shoulder pain and prior vascular surgery.  On physical exam, she had multiphasic signals in the brachial and ulnar arteries.  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  The hand was nice and warm.  She noted no  numbness tingling at the level of the digits.  Imaging was reviewed, demonstrating flow throughout.  Blood pressure cuff sbp 100 mmHg.  Femoral-femoral bypass patent, excellent signals in the feet  The left shoulder pain is not due to vascular insufficiency.  She would benefit from follow-up with our office.  This has been scheduled.   Fonda FORBES Rim MD MS [facility] [phone] 08/16/2023  9:27 PM

## 2023-08-17 DIAGNOSIS — F0393 Unspecified dementia, unspecified severity, with mood disturbance: Secondary | ICD-10-CM | POA: Diagnosis present

## 2023-08-17 DIAGNOSIS — I7092 Chronic total occlusion of artery of the extremities: Secondary | ICD-10-CM | POA: Diagnosis present

## 2023-08-17 DIAGNOSIS — K219 Gastro-esophageal reflux disease without esophagitis: Secondary | ICD-10-CM | POA: Diagnosis present

## 2023-08-17 DIAGNOSIS — E871 Hypo-osmolality and hyponatremia: Secondary | ICD-10-CM | POA: Diagnosis present

## 2023-08-17 DIAGNOSIS — Z79899 Other long term (current) drug therapy: Secondary | ICD-10-CM | POA: Diagnosis not present

## 2023-08-17 DIAGNOSIS — I2510 Atherosclerotic heart disease of native coronary artery without angina pectoris: Secondary | ICD-10-CM | POA: Diagnosis present

## 2023-08-17 DIAGNOSIS — I6522 Occlusion and stenosis of left carotid artery: Secondary | ICD-10-CM | POA: Diagnosis present

## 2023-08-17 DIAGNOSIS — F32A Depression, unspecified: Secondary | ICD-10-CM | POA: Diagnosis present

## 2023-08-17 DIAGNOSIS — Z951 Presence of aortocoronary bypass graft: Secondary | ICD-10-CM | POA: Diagnosis not present

## 2023-08-17 DIAGNOSIS — I480 Paroxysmal atrial fibrillation: Secondary | ICD-10-CM | POA: Diagnosis present

## 2023-08-17 DIAGNOSIS — Z79890 Hormone replacement therapy (postmenopausal): Secondary | ICD-10-CM | POA: Diagnosis not present

## 2023-08-17 DIAGNOSIS — M25512 Pain in left shoulder: Secondary | ICD-10-CM | POA: Diagnosis present

## 2023-08-17 DIAGNOSIS — Z87891 Personal history of nicotine dependence: Secondary | ICD-10-CM | POA: Diagnosis not present

## 2023-08-17 DIAGNOSIS — I130 Hypertensive heart and chronic kidney disease with heart failure and stage 1 through stage 4 chronic kidney disease, or unspecified chronic kidney disease: Secondary | ICD-10-CM | POA: Diagnosis present

## 2023-08-17 DIAGNOSIS — N3000 Acute cystitis without hematuria: Secondary | ICD-10-CM | POA: Diagnosis present

## 2023-08-17 DIAGNOSIS — G9341 Metabolic encephalopathy: Secondary | ICD-10-CM | POA: Diagnosis present

## 2023-08-17 DIAGNOSIS — Z8249 Family history of ischemic heart disease and other diseases of the circulatory system: Secondary | ICD-10-CM | POA: Diagnosis not present

## 2023-08-17 DIAGNOSIS — I739 Peripheral vascular disease, unspecified: Secondary | ICD-10-CM | POA: Diagnosis present

## 2023-08-17 DIAGNOSIS — I5042 Chronic combined systolic (congestive) and diastolic (congestive) heart failure: Secondary | ICD-10-CM | POA: Diagnosis present

## 2023-08-17 DIAGNOSIS — N1831 Chronic kidney disease, stage 3a: Secondary | ICD-10-CM | POA: Diagnosis present

## 2023-08-17 DIAGNOSIS — E782 Mixed hyperlipidemia: Secondary | ICD-10-CM | POA: Diagnosis present

## 2023-08-17 DIAGNOSIS — R410 Disorientation, unspecified: Principal | ICD-10-CM

## 2023-08-17 DIAGNOSIS — M79602 Pain in left arm: Secondary | ICD-10-CM

## 2023-08-17 DIAGNOSIS — F0394 Unspecified dementia, unspecified severity, with anxiety: Secondary | ICD-10-CM | POA: Diagnosis present

## 2023-08-17 DIAGNOSIS — E039 Hypothyroidism, unspecified: Secondary | ICD-10-CM | POA: Diagnosis present

## 2023-08-17 DIAGNOSIS — E669 Obesity, unspecified: Secondary | ICD-10-CM | POA: Diagnosis present

## 2023-08-17 DIAGNOSIS — I252 Old myocardial infarction: Secondary | ICD-10-CM | POA: Diagnosis not present

## 2023-08-17 LAB — TSH: TSH: 6.574 u[IU]/mL — ABNORMAL HIGH (ref 0.350–4.500)

## 2023-08-17 LAB — CBC
HCT: 39.8 % (ref 36.0–46.0)
Hemoglobin: 12.8 g/dL (ref 12.0–15.0)
MCH: 28.6 pg (ref 26.0–34.0)
MCHC: 32.2 g/dL (ref 30.0–36.0)
MCV: 88.8 fL (ref 80.0–100.0)
Platelets: 150 10*3/uL (ref 150–400)
RBC: 4.48 MIL/uL (ref 3.87–5.11)
RDW: 14.4 % (ref 11.5–15.5)
WBC: 5.5 10*3/uL (ref 4.0–10.5)
nRBC: 0 % (ref 0.0–0.2)

## 2023-08-17 LAB — BASIC METABOLIC PANEL
Anion gap: 10 (ref 5–15)
BUN: 17 mg/dL (ref 8–23)
CO2: 27 mmol/L (ref 22–32)
Calcium: 9.2 mg/dL (ref 8.9–10.3)
Chloride: 97 mmol/L — ABNORMAL LOW (ref 98–111)
Creatinine, Ser: 1.18 mg/dL — ABNORMAL HIGH (ref 0.44–1.00)
GFR, Estimated: 46 mL/min — ABNORMAL LOW (ref >60)
Glucose, Bld: 101 mg/dL — ABNORMAL HIGH (ref 70–99)
Potassium: 3.8 mmol/L (ref 3.5–5.1)
Sodium: 134 mmol/L — ABNORMAL LOW (ref 135–145)

## 2023-08-17 LAB — VITAMIN D 25 HYDROXY (VIT D DEFICIENCY, FRACTURES): Vit D, 25-Hydroxy: 32.36 ng/mL (ref 30–100)

## 2023-08-17 LAB — VITAMIN B12: Vitamin B-12: 309 pg/mL (ref 180–914)

## 2023-08-17 NOTE — Hospital Course (Addendum)
 2F w/ hx of TBI c/b mild cognitive deficit/dementia brought in by daughter due to recent behavior and mental status change-over past 2 weeks patient has been very demanding and sometimes calling hide up to 20 times a day and has been more agitated over the past 2 days PTA. In ED: Vitals stable afebrile labs,normal CBC, sodium 137, K+ 4.2, glucose 100, creatinine 1.08, troponin 22-21, lactic acid 0.6, UA shows negative nitrite, moderate leuks, WBC 21-50 but no bacteria > diagnosed with UTI with acute metabolic encephalopathy and admitted for further management. Urine culture growing E. coli sensitive to cefazolin  and ceftriaxone , antibiotic changed to p.o. regimen.  Mental status overall better, remains deconditioned weak and PT OT recommending skilled nursing facility

## 2023-08-17 NOTE — Evaluation (Signed)
 Physical Therapy Evaluation Patient Details Name: Deanna [name] MRN: [id number] DOB: [birth year] Today's Date: 08/17/2023  History of Present Illness  Pt is a 83 y.o. female admitted with behavior and mental status changes, found to have UTI. Daughter states pt with intermittent confusion, wandering to neighbors and making accusations that daughter is trying to kill her. PMH significant of TBI c/b mild cognitive deficit, chronic combined systolic and diastolic HF, anxiety and depression, GERD, HTN, HLD, hypothyroidism OSA on CPAP, chronic left shoulder pain, PAD, CAD, CKD 3B, carotid artery stenosis and celiac artery stenosis.  Clinical Impression  Pt admitted with above diagnosis. Pt's bed saturated in urine at start of session. Assisted pt to ambulate 55' to bathroom with RW, assisted with pericare and gown change. Pt refused ambulation in the hall, she walked 14' back to bed. Pt oriented to self, location, situation, but very focused on wanting to talk to her daughter. Nurse stated pt's phone has been removed from the room because she was calling 911, and daughter has been called and is coming this afternoon. Patient will benefit from continued inpatient follow up therapy, <3 hours/day.  Pt currently with functional limitations due to the deficits listed below (see PT Problem List). Pt will benefit from acute skilled PT to increase their independence and safety with mobility to allow discharge.           If plan is discharge home, recommend the following: A little help with walking and/or transfers;A little help with bathing/dressing/bathroom;Assistance with cooking/housework;Assist for transportation;Help with stairs or ramp for entrance   Can travel by private vehicle   Yes    Equipment Recommendations None recommended by PT  Recommendations for Other Services       Functional Status Assessment Patient has had a recent decline in their functional status and demonstrates the ability to  make significant improvements in function in a reasonable and predictable amount of time.     Precautions / Restrictions Precautions Precautions: Fall Restrictions Weight Bearing Restrictions Per Provider Order: No      Mobility  Bed Mobility Overal bed mobility: Needs Assistance Bed Mobility: Supine to Sit     Supine to sit: Min assist, HOB elevated     General bed mobility comments: light HHA and cues to scoot hips forward    Transfers Overall transfer level: Needs assistance Equipment used: Rolling walker (2 wheels) Transfers: Sit to/from Stand Sit to Stand: Contact guard assist           General transfer comment: mild unsteadiness with STS transfers, no overt LOB, VCs hand placement    Ambulation/Gait Ambulation/Gait assistance: Contact guard assist Gait Distance (Feet): 14 Feet Assistive device: Rolling walker (2 wheels) Gait Pattern/deviations: Step-through pattern, Decreased stride length, Trunk flexed Gait velocity: decr     General Gait Details: 56' to bathroom, then 14' to bed; pt's bed saturated in urine at start of PT session. Assisted pt with pericare and gown change.  Stairs            Wheelchair Mobility     Tilt Bed    Modified Rankin (Stroke Patients Only)       Balance Overall balance assessment: Needs assistance Sitting-balance support: Feet supported Sitting balance-Leahy Scale: Good Sitting balance - Comments: steady reaching outside BOS   Standing balance support: Bilateral upper extremity supported, During functional activity Standing balance-Leahy Scale: Fair Standing balance comment: mild unsteadiness  Pertinent Vitals/Pain Pain Assessment Pain Assessment: No/denies pain Faces Pain Scale: No hurt    Home Living Family/patient expects to be discharged to:: Skilled nursing facility Living Arrangements: Alone;Children (pt states she typically lives alone but daughter has been  living with her) Available Help at Discharge: Family;Available PRN/intermittently Type of Home: House Home Access: Level entry       Home Layout: One level Home Equipment: Grab bars - tub/shower;Grab bars - toilet;Shower Counsellor (2 wheels) Additional Comments: Unsure of home setup accuracy - pt states she lives in [place].    Prior Function Prior Level of Function : Patient poor historian/Family not available;Independent/Modified Independent             Mobility Comments: pt states she does not use AD at baseline ADLs Comments: pt states independent in ADLs     Extremity/Trunk Assessment   Upper Extremity Assessment Upper Extremity Assessment: Defer to OT evaluation    Lower Extremity Assessment Lower Extremity Assessment: Overall WFL for tasks assessed    Cervical / Trunk Assessment Cervical / Trunk Assessment: Normal  Communication   Communication Communication: No apparent difficulties  Cognition Arousal: Alert Behavior During Therapy: Anxious Overall Cognitive Status: Impaired/Different from baseline Area of Impairment: Orientation                 Orientation Level: Disoriented to, Time             General Comments: pt pleasant, aware she's in hospital with a bladder infection. Very focused on wanting to call her daughter, pt does not have a phone in the room, nurse stated phone was removed bc pt was calling 911. Nurse stated pt's daughter has been called and pt was notified daughter is coming this afternoon.        General Comments      Exercises     Assessment/Plan    PT Assessment Patient needs continued PT services  PT Problem List Decreased activity tolerance;Decreased balance;Decreased mobility       PT Treatment Interventions DME instruction;Gait training;Therapeutic exercise;Functional mobility training;Therapeutic activities;Patient/family education;Cognitive remediation    PT Goals (Current goals can be found in the  Care Plan section)  Acute Rehab PT Goals PT Goal Formulation: Patient unable to participate in goal setting Time For Goal Achievement: 08/31/23 Potential to Achieve Goals: Good    Frequency Min 1X/week     Co-evaluation               AM-PAC PT 6 Clicks Mobility  Outcome Measure Help needed turning from your back to your side while in a flat bed without using bedrails?: None Help needed moving from lying on your back to sitting on the side of a flat bed without using bedrails?: A Little Help needed moving to and from a bed to a chair (including a wheelchair)?: A Little Help needed standing up from a chair using your arms (e.g., wheelchair or bedside chair)?: A Little Help needed to walk in hospital room?: A Little Help needed climbing 3-5 steps with a railing? : A Lot 6 Click Score: 18    End of Session Equipment Utilized During Treatment: Gait belt Activity Tolerance: Patient tolerated treatment well Patient left: in bed;with call bell/phone within reach;with bed alarm set Nurse Communication: Mobility status PT Visit Diagnosis: Difficulty in walking, not elsewhere classified (R26.2)    Time: 8687-8666 PT Time Calculation (min) (ACUTE ONLY): 21 min   Charges:   PT Evaluation $PT Eval Moderate Complexity: 1 Mod   PT  General Charges $$ ACUTE PT VISIT: 1 Visit         Sylvan Delon Copp PT 08/17/2023  Acute Rehabilitation Services  Office [phone]

## 2023-08-17 NOTE — Progress Notes (Signed)
   08/17/23 0020  BiPAP/CPAP/SIPAP  $ Non-Invasive Home Ventilator  Initial  $ Face Mask Medium Yes  BiPAP/CPAP/SIPAP Pt Type Adult  BiPAP/CPAP/SIPAP Resmed  Mask Type Nasal mask  Mask Size Medium  FiO2 (%) 21 %  Patient Home Equipment No  Auto Titrate Yes (V-auto  5-20 cm H2O)  CPAP/SIPAP surface wiped down Yes

## 2023-08-17 NOTE — Progress Notes (Signed)
   08/17/23 2338  BiPAP/CPAP/SIPAP  BiPAP/CPAP/SIPAP Pt Type Adult  Reason BIPAP/CPAP not in use Non-compliant (Patient refused CPAP qhs.  Patient states that, despite wearing it last night, she doesn't wear it at home and doesn't want to here as well.

## 2023-08-17 NOTE — Evaluation (Addendum)
 Occupational Therapy Evaluation Patient Details Name: Deanna [name] MRN: [id number] DOB: [birth year] Today's Date: 08/17/2023   History of Present Illness Pt is a 83 y.o. female admitted with behavior and mental status changes, found to have UTI. Daughter states pt with intermittent confusion, wandering to neighbors and making accusations that daughter is trying to kill her. PMH significant of TBI c/b mild cognitive deficit, chronic combined systolic and diastolic HF, anxiety and depression, GERD, HTN, HLD, hypothyroidism OSA on CPAP, chronic left shoulder pain, PAD, CAD, CKD 3B, carotid artery stenosis and celiac artery stenosis.   Clinical Impression   Prior to hospital admission, pt states she was independent with ADLs and mobility (unsure of accuracy due to hx of cognitive deficits). Pt lives alone and states her daughter often stays with her. Pt very pleasant and participatory. Alert to self, location, situation, min cues for date. Pt noted to have decreased STM as she was unable to remember the date a few minutes later and asked this clinical research associate. Pt completing bed mobility with minA supine to EOB, transfers with minA + RW, toileting tasks with minA, dons underwear with minA thread over feet and pull over hips. Pt would benefit from skilled OT services to address noted impairments and functional limitations (see below for any additional details) in order to maximize safety and independence while minimizing falls risk and caregiver burden. Requires assist for med mgmt due to cognition; will need 24/7 supervision at discharge. Patient will benefit from continued inpatient follow up therapy, <3 hours/day.       If plan is discharge home, recommend the following: A little help with walking and/or transfers;A little help with bathing/dressing/bathroom;Assistance with cooking/housework;Direct supervision/assist for medications management;Direct supervision/assist for financial management;Assist for  transportation;Supervision due to cognitive status    Functional Status Assessment  Patient has had a recent decline in their functional status and demonstrates the ability to make significant improvements in function in a reasonable and predictable amount of time.  Equipment Recommendations  None recommended by OT (defer)       Precautions / Restrictions Precautions Precautions: Fall Restrictions Weight Bearing Restrictions Per Provider Order: No      Mobility Bed Mobility Overal bed mobility: Needs Assistance Bed Mobility: Supine to Sit     Supine to sit: Min assist, HOB elevated     General bed mobility comments: light HHA and cues to scoot hips forward    Transfers Overall transfer level: Needs assistance Equipment used: Rolling walker (2 wheels) Transfers: Sit to/from Stand, Bed to chair/wheelchair/BSC Sit to Stand: Min assist     Step pivot transfers: Min assist     General transfer comment: mild unsteadiness with STS transfers, no overt LOB      Balance Overall balance assessment: Needs assistance Sitting-balance support: Feet supported Sitting balance-Leahy Scale: Good Sitting balance - Comments: steady reaching outside BOS   Standing balance support: Bilateral upper extremity supported, During functional activity Standing balance-Leahy Scale: Fair Standing balance comment: mild unsteadiness                           ADL either performed or assessed with clinical judgement   ADL Overall ADL's : Needs assistance/impaired Eating/Feeding: Independent;Set up Eating/Feeding Details (indicate cue type and reason): pt observed to be drinking coffee upright in ed Grooming: Wash/dry face;Wash/dry hands;Standing;Sitting;Set up   Upper Body Bathing: Sitting;Set up   Lower Body Bathing: Sit to/from stand;Sitting/lateral leans;Minimal assistance   Upper Body Dressing : Minimal  assistance;Sitting Upper Body Dressing Details (indicate cue type and  reason): minA due to L shld pain Lower Body Dressing: Minimal assistance;Sit to/from stand Lower Body Dressing Details (indicate cue type and reason): dons underwear with minA to thread over feet and pull over hips Toilet Transfer: Minimal assistance;Rolling walker (2 wheels);Regular Teacher, Adult Education Details (indicate cue type and reason): cues for sequencing Toileting- Clothing Manipulation and Hygiene: Minimal assistance;Sit to/from stand Toileting - Clothing Manipulation Details (indicate cue type and reason): minA for hygiene     Functional mobility during ADLs: Rolling walker (2 wheels);Contact guard assist General ADL Comments: Pt follows directions consistently. Some sequencing difficulties with RW, pt states she does not typically use AD at baseline     Vision Baseline Vision/History: 1 Wears glasses Ability to See in Adequate Light: 1 Impaired Patient Visual Report: Blurring of vision Additional Comments: pt with difficulties reading large print on wall, needs to squint to read clock. reads name badge WNL            Pertinent Vitals/Pain Pain Assessment Pain Assessment: Faces Faces Pain Scale: Hurts little more Pain Location: L shld, R knee Pain Descriptors / Indicators: Shooting, Sharp, Grimacing, Discomfort Pain Intervention(s): Limited activity within patient's tolerance, Monitored during session     Extremity/Trunk Assessment Upper Extremity Assessment Upper Extremity Assessment: LUE deficits/detail;Right hand dominant;RUE deficits/detail RUE Deficits / Details: grossly 4+/5 LUE Deficits / Details: chronic pain due to OA, pt scheduled for outpatient f/u   Lower Extremity Assessment Lower Extremity Assessment: Defer to PT evaluation;RLE deficits/detail RLE Deficits / Details: pt reporting R knee feels wobbly, and painful. MD in room and aware       Communication Communication Communication: No apparent difficulties   Cognition Arousal: Alert Behavior  During Therapy: WFL for tasks assessed/performed Overall Cognitive Status: Impaired/Different from baseline Area of Impairment: Orientation                 Orientation Level: Disoriented to, Time             General Comments: pt pleasant                Home Living Family/patient expects to be discharged to:: Skilled nursing facility Living Arrangements: Alone;Children (pt states she typically lives alone but daughter has been living with her) Available Help at Discharge: Family;Available PRN/intermittently Type of Home: House Home Access: Level entry     Home Layout: One level     Bathroom Shower/Tub: Producer, Television/film/video: Standard Bathroom Accessibility: Yes   Home Equipment: Grab bars - tub/shower;Grab bars - toilet;Shower seat   Additional Comments: Unsure of home setup accuracy - pt states she lives in [place].      Prior Functioning/Environment Prior Level of Function : Patient poor historian/Family not available;Independent/Modified Independent             Mobility Comments: pt states she does not use AD at baseline ADLs Comments: pt states independent in ADLs        OT Problem List: Decreased range of motion;Impaired balance (sitting and/or standing);Decreased cognition;Impaired UE functional use;Decreased knowledge of use of DME or AE      OT Treatment/Interventions: Self-care/ADL training;Therapeutic exercise;Neuromuscular education;DME and/or AE instruction;Therapeutic activities;Cognitive remediation/compensation;Patient/family education;Balance training    OT Goals(Current goals can be found in the care plan section) Acute Rehab OT Goals OT Goal Formulation: Patient unable to participate in goal setting Time For Goal Achievement: 08/31/23 Potential to Achieve Goals: Fair ADL Goals Pt Will Perform Lower  Body Dressing: with supervision;sit to/from stand Pt Will Transfer to Toilet: with supervision;grab bars;ambulating;regular  height toilet Pt Will Perform Toileting - Clothing Manipulation and hygiene: with supervision;sitting/lateral leans;sit to/from stand  OT Frequency: Min 1X/week    AM-PAC OT 6 Clicks Daily Activity     Outcome Measure Help from another person eating meals?: None Help from another person taking care of personal grooming?: None Help from another person toileting, which includes using toliet, bedpan, or urinal?: A Little Help from another person bathing (including washing, rinsing, drying)?: A Little Help from another person to put on and taking off regular upper body clothing?: A Little Help from another person to put on and taking off regular lower body clothing?: A Little 6 Click Score: 20   End of Session Equipment Utilized During Treatment: Rolling walker (2 wheels);Gait belt Nurse Communication: Mobility status  Activity Tolerance: Patient tolerated treatment well Patient left: in chair;with call bell/phone within reach;with chair alarm set  OT Visit Diagnosis: Unsteadiness on feet (R26.81);Other symptoms and signs involving cognitive function                Time: 9154-9079 OT Time Calculation (min): 35 min Charges:  OT General Charges $OT Visit: 1 Visit OT Evaluation $OT Eval Low Complexity: 1 Low OT Treatments $Self Care/Home Management : 8-22 mins  Evella Kasal L. Kayen Grabel, OTR/L  08/17/23, 10:25 AM

## 2023-08-17 NOTE — Progress Notes (Signed)
 PROGRESS NOTE Deanna [name]  FMW:[id number] DOB: 1941/02/18 DOA: 08/16/2023 PCP: Caro [name] POUR, NP  Brief Narrative/Hospital Course: 82F w/ hx of TBI c/b mild cognitive deficit/dementia brought in by daughter due to recent behavior and mental status change-over past 2 weeks patient has been very demanding and sometimes calling hide up to 20 times a day and has been more agitated over the past 2 days PTA. In ED: Vitals stable afebrile labs,normal CBC, sodium 137, K+ 4.2, glucose 100, creatinine 1.08, troponin 22-21, lactic acid 0.6, UA shows negative nitrite, moderate leuks, WBC 21-50 but no bacteria > diagnosed with UTI with acute metabolic encephalopathy and admitted for further management.   Subjective: Patient seen and examined Alert awake pleasantly confused Earlier oriented to self place people Overnight afebrile BP stable, not hypoxic She tells me she is here due to her urine tract infection  Assessment and Plan: Principal Problem:   UTI (urinary tract infection) Active Problems:   Left arm pain   Delirium   Acute cystitis: Patient more confused agitated from baseline MCi-workup consistent with UTI continue ceftriaxone  follow-up culture.  Acute metabolic encephalopathy TBI W/ Cognitive impairment ?Dementia at baseline Anxiety and depression Patient has had intermittent confusion, and recent behavior changes past 2 weeks, wandering through her neighbor's house and that her daughter is trying to kill her.  Unsure if patient is safe to stay at home with progression of her cognitive impairment. This morning much more alert awake and communicative interactive. continue treatment of underlying UTI, D2 32 b12 309 TSH pending Cont delirium precaution for precaution PT OT eval and TOC consult for placement. Continue Lexapro , Depakote , BuSpar  and as needed Xanax :  Mild hyponatremia: Encourage p.o. intake.  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 Vascular surgery consulted and noted that all these findings are chronic. Vascular insufficiency not the culprit for her left shoulder pain. Vascular surgery following, appreciate, Continue Plavix , rosuvastatin  and aspirin    Chronic left shoulder pain: Recent imaging of the left shoulder 2 months ago showed osteoarthritis of the shoulder with 2 cm loose body in the axillary pouch. Patient recently received injection to the shoulder by orthopedic surgery. She continues to endorse left shoulder pain worse with lifting of the arm. Continue Tylenol  scheduled, Voltaren  gel, has a follow-up with orthopedic surgery coming Friday  Chronic combined systolic and systolic HF: Euvolemic on exam.  Continue home Lasix  40 every other day   HTN BP fairly stable continue home Toprol   25   CAD HLD Lipid panel 2 months ago showed LDL of 40.  Troponin flat 22-21 continue home aspirin  Plavix  Zetia  and statin   Hypothyroidism TSH borderline low at 0.41 2 months ago-rechecking.  Continue home Synthroid    GERD: Continue home PPI  Obesity: Patient's Body mass index is 32.32 kg/m. : Will benefit with PCP follow-up, weight loss  healthy lifestyle and outpatient sleep evaluation.  DVT prophylaxis: enoxaparin  (LOVENOX ) injection 40 mg Start: 08/16/23 2000 Code Status:   Code Status: Full Code Family Communication: plan of care discussed with patient/I called to update daughter Bruno - no answer.  Patient status is: Remains hospitalized because of severity of illness. Level of care: Med-Surg   Dispo: The patient is from: Home alone            Anticipated disposition: TBD-likely needs  placement Objective: Vitals last 24 hrs: Vitals:   08/16/23 1745 08/16/23 2114 08/17/23 0214 08/17/23 0623  BP: (!) 109/56 (!) 177/90 114/88 (!)  153/81  Pulse: (!) 101 (!) 103 78 75  Resp: 15 16 16 17   Temp:  98.9 F (37.2 C) (!) 97.5 F (36.4 C) (!) 97.5 F (36.4 C)  TempSrc:  Oral Oral Oral  SpO2: 93% 95% 94% 96%  Weight:      Height:       Weight change:   Physical Examination: General exam: alert awake, oriented to self place people pleasantly confused  HEENT:Oral mucosa moist, Ear/Nose WNL grossly Respiratory system: Bilaterally clear BS,no use of accessory muscle Cardiovascular system: S1 & S2 +, No JVD. Gastrointestinal system: Abdomen soft,NT,ND, BS+ Nervous System: Alert, awake, moving all extremities,and following commands. Extremities: LE edema neg,distal peripheral pulses palpable and warm.  Skin: No rashes,no icterus. MSK: Normal muscle bulk,tone, power   Medications reviewed:  Scheduled Meds:  acetaminophen   1,000 mg Oral TID   aspirin  EC  81 mg Oral QHS   busPIRone   15 mg Oral BID   cholecalciferol   5,000 Units Oral QHS   clopidogrel   75 mg Oral QHS   diclofenac  Sodium  2 g Topical QID   divalproex   250 mg Oral QHS   dorzolamide   1 drop Both Eyes BID   enoxaparin  (LOVENOX ) injection  40 mg Subcutaneous Q24H   escitalopram   20 mg Oral Daily   ezetimibe   10 mg Oral Daily   furosemide   40 mg Oral QODAY   gabapentin   300 mg Oral Daily   levothyroxine   175 mcg Oral Q0600   metoprolol  succinate  25 mg Oral Daily   pantoprazole   40 mg Oral QAC breakfast   rosuvastatin   40 mg Oral QHS   vitamin B-12  100 mcg Oral Daily   Continuous Infusions:  cefTRIAXone  (ROCEPHIN )  IV      Diet Order             Diet regular Room service appropriate? Yes; Fluid consistency: Thin  Diet effective now                  Intake/Output Summary (Last 24 hours) at 08/17/2023 0806 Last data filed at 08/17/2023 0600 Gross per 24 hour  Intake 400 ml  Output 200 ml  Net 200 ml   Net IO Since Admission: 200 mL [08/17/23 0806]  Wt Readings from Last 3 Encounters:  08/16/23 77.6 kg  06/20/23 77.6 kg  06/16/23 77.6  kg     Unresulted Labs (From admission, onward)     Start     Ordered   08/17/23 0500  VITAMIN D  25 Hydroxy (Vit-D Deficiency, Fractures)  Tomorrow morning,   R        08/16/23 2333   08/17/23 0500  Vitamin B12  Tomorrow morning,   R        08/16/23 2333   08/17/23 0500  TSH  Tomorrow morning,   R        08/16/23 2333   08/17/23 0500  Basic metabolic panel  Tomorrow morning,   R        08/16/23 2333   08/17/23 0500  CBC  Tomorrow morning,   R        08/16/23 2333          Data Reviewed: I have personally reviewed following labs and imaging studies CBC: Recent Labs  Lab 08/16/23 1358 08/16/23 1408  WBC 5.4  --  NEUTROABS 3.2  --   HGB 12.9 13.3  HCT 38.5 39.0  MCV 88.1  --   PLT 152  --    Basic Metabolic Panel:  Recent Labs  Lab 08/16/23 1358 08/16/23 1408  NA 137 138  K 4.2 4.3  CL 103 102  CO2 25  --   GLUCOSE 100* 92  BUN 20 18  CREATININE 1.08* 1.30*  CALCIUM  9.1  --    GFR: Estimated Creatinine Clearance: 32 mL/min (A) (by C-G formula based on SCr of 1.3 mg/dL (H)). Liver Function Tests:  Recent Labs  Lab 08/16/23 1358  AST 19  ALT 28  ALKPHOS 63  BILITOT 1.9*  PROT 6.8  ALBUMIN  4.0   No results for input(s): LIPASE, AMYLASE in the last 168 hours. No results for input(s): AMMONIA in the last 168 hours. Sepsis Labs: Recent Labs  Lab 08/16/23 1729  LATICACIDVEN 0.6   Recent Results (from the past 240 hours)  Urine Culture     Status: None (Preliminary result)   Collection Time: 08/16/23  4:13 PM   Specimen: Urine, Clean Catch  Result Value Ref Range Status   Specimen Description   Final    URINE, CLEAN CATCH Performed at [facility], [address], [place], [place] [address]    Special Requests   Final    NONE Reflexed from [id number] Performed at [facility], [address], [place], [place] [address]    Culture PENDING  Incomplete   Report Status PENDING  Incomplete     Antimicrobials/Microbiology: Anti-infectives (From admission, onward)    Start     Dose/Rate Route Frequency Ordered Stop   08/17/23 1730  cefTRIAXone  (ROCEPHIN ) 1 g in sodium chloride  0.9 % 100 mL IVPB        1 g 200 mL/hr over 30 Minutes Intravenous Every 24 hours 08/16/23 2334 08/21/23 1729   08/16/23 1730  cefTRIAXone  (ROCEPHIN ) 2 g in sodium chloride  0.9 % 100 mL IVPB        2 g 200 mL/hr over 30 Minutes Intravenous  Once 08/16/23 1718 08/16/23 1804         Component Value Date/Time   SDES  08/16/2023 1613    URINE, CLEAN CATCH Performed at [facility], [address], [place], [place] [address]    SPECREQUEST  08/16/2023 1613    NONE Reflexed from [id number] Performed at [facility], [address] [place], [place] [address]    CULT PENDING 08/16/2023 1613   REPTSTATUS PENDING 08/16/2023 1613     Radiology Studies: CT ANGIO UP EXTREM LEFT W &/OR WO CONTAST Result Date: 08/16/2023 CLINICAL DATA:  Pain.  Vascular murmur. EXAM: CT ANGIOGRAPHY UPPER LEFT EXTREMITY TECHNIQUE: CT angiogram imaging of the left upper extremity was performed after the administration of intravenous contrast. Coronal and sagittal reformats were submitted. MIP reformats were submitted as well. CONTRAST:  OMNIPAQUE  IOHEXOL  350 MG/ML SOLN COMPARISON:  None Available. FINDINGS: Arterial: There severe stenosis/occlusion of the proximal 3.5 cm of the left subclavian artery secondary to calcified atherosclerotic disease. The distal subclavian, axillary, brachial, ulnar, and radial arteries appear patent to the level of the wrist. There is no dissection or aneurysm. There is also severe stenosis of the origin of the left common carotid artery near the arch of the aorta. There also severe atherosclerotic calcifications of the distal left common carotid artery. Note is made of the accessory arterial branch extending from the patent portion of  the left common carotid artery to the mid left  subclavian artery. There is severe focal stenosis of the celiac artery, but visualized branches are otherwise within normal limits. Osseous: Degenerative changes are seen in the glenohumeral joint with joint space narrowing and osteophyte formation. No acute fracture or focal osseous lesion identified. Soft tissues: No evidence for soft tissue infection, hematoma or gas. There soft tissue calcifications along the posteroinferior margin of the glenoid. There severe degenerative changes of the spine. Others: Subcentimeter renal cysts are present on the left. Colonic diverticula are present. Review of the MIP images confirms the above findings. IMPRESSION: 1. 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 2. 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 3. Severe focal stenosis of the celiac artery. 4. Colonic diverticulosis. Electronically Signed   By: Greig Pique M.D.   On: 08/16/2023 16:36   CT Head Wo Contrast Result Date: 08/16/2023 CLINICAL DATA:  Delirium. Asymmetric upper extremity pulses, less prominent on the left. Left arm and shoulder pain. EXAM: CT HEAD WITHOUT CONTRAST TECHNIQUE: Contiguous axial images were obtained from the base of the skull through the vertex without intravenous contrast. RADIATION DOSE REDUCTION: This exam was performed according to the departmental dose-optimization program which includes automated exposure control, adjustment of the mA and/or kV according to patient size and/or use of iterative reconstruction technique. COMPARISON:  MR head without contrast 11/16/2016 FINDINGS: Brain: Mild atrophy and moderate periventricular white matter hypoattenuation demonstrates continued progression. Deep brain nuclei are within normal limits. No acute or focal cortical infarct is present. No acute hemorrhage or mass lesion is present. The ventricles are proportionate to the degree of atrophy. No  significant extraaxial fluid collection is present. The brainstem and cerebellum are within normal limits. Midline structures are within normal limits. Vascular: Minimal calcifications are present within the cavernous internal carotid arteries. No hyperdense vessel is present. Skull: Calvarium is intact. No focal lytic or blastic lesions are present. No significant extracranial soft tissue lesion is present. Sinuses/Orbits: The paranasal sinuses and mastoid air cells are clear. Bilateral lens replacements are noted. Globes and orbits are otherwise unremarkable. IMPRESSION: 1. No acute intracranial abnormality or significant interval change. 2. Mild atrophy and moderate periventricular white matter disease demonstrates continued progression. This likely reflects the sequela of chronic microvascular ischemia. Electronically Signed   By: Lonni Necessary M.D.   On: 08/16/2023 15:19     LOS: 0 days   Total time spent in review of labs and imaging, patient evaluation, formulation of plan, documentation and communication with family: 35 minutes  Mennie LAMY, MD [facility]  08/17/2023, 8:06 AM

## 2023-08-17 NOTE — Plan of Care (Signed)

## 2023-08-18 ENCOUNTER — Ambulatory Visit: Payer: Medicare HMO | Admitting: Orthopaedic Surgery

## 2023-08-18 DIAGNOSIS — N3000 Acute cystitis without hematuria: Secondary | ICD-10-CM | POA: Diagnosis not present

## 2023-08-18 LAB — URINE CULTURE: Culture: >=100000 — AB

## 2023-08-18 LAB — T4, FREE: Free T4: 0.85 ng/dL (ref 0.61–1.12)

## 2023-08-18 MED ORDER — ALPRAZOLAM 0.5 MG PO TABS
0.5000 mg | ORAL_TABLET | Freq: Once | ORAL | Status: AC
  Administered 2023-08-18: 0.5 mg via ORAL

## 2023-08-18 NOTE — Plan of Care (Signed)
   Problem: Clinical Measurements: Goal: Ability to maintain clinical measurements within normal limits will improve Outcome: Progressing

## 2023-08-18 NOTE — Progress Notes (Signed)
 PROGRESS NOTE Deanna [name]  FMW:[id number] DOB: Jul 01, 1941 DOA: 08/16/2023 PCP: Caro [name] POUR, NP  Brief Narrative/Hospital Course: 36F w/ hx of TBI c/b mild cognitive deficit/dementia brought in by daughter due to recent behavior and mental status change-over past 2 weeks patient has been very demanding and sometimes calling hide up to 20 times a day and has been more agitated over the past 2 days PTA. In ED: Vitals stable afebrile labs,normal CBC, sodium 137, K+ 4.2, glucose 100, creatinine 1.08, troponin 22-21, lactic acid 0.6, UA shows negative nitrite, moderate leuks, WBC 21-50 but no bacteria > diagnosed with UTI with acute metabolic encephalopathy and admitted for further management.  Subjective: Patient seen and examined Alert awake oriented to self place people Mentation much improved Overnight afebrile   Assessment and Plan: Principal Problem:   UTI (urinary tract infection) Active Problems:   Left arm pain   Delirium   Acute cystitis: GNR in urine cx and pending.  Will change antibiotics for cystitis coverage x 5 days follow-up culture data. patient admitted with more confusion at baseline MCI due to UTI.  Acute metabolic encephalopathy TBI W/ Cognitive impairment ?Dementia at baseline Anxiety and depression Patient has had intermittent confusion, and recent behavior changes past 2 weeks, wandering through her neighbor's house and that her daughter is trying to kill her.  Unsure if patient is safe to stay at home with progression of her cognitive impairment> mental status worsened by UTI. Vit D2 32 b12 309 TSH 6.5- check FT4 Currently clinically improved and communicative interactive. Cont delirium precaution for precaution ptot AND plan for SNF Continue home Lexapro , Depakote , BuSpar  and as needed Xanax .  Depakote  level low unsure of compliance will need recheck soon.  CKD 3a Mild hyponatremia: Renal function is stable.  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 Vascular surgery consulted and noted that all these findings are chronic. Vascular insufficiency not the culprit for her left shoulder pain. Vascular surgery following, appreciate, Continue Plavix , rosuvastatin  and aspirin    Chronic left shoulder pain: Recent imaging of the left shoulder 2 months ago showed osteoarthritis of the shoulder with 2 cm loose body in the axillary pouch. Patient recently received injection to the shoulder by orthopedic surgery. Havin shoulder pain worse with lifting of the arm. Continue Tylenol  scheduled, Voltaren  gel, has a follow-up with orthopedic surgery will need to postpone  Chronic combined systolic and systolic HF: Volume status stable.  Continue home Lasix  every other day   HTN BP stable on Toprol   25   CAD HLD Lipid panel 2 months ago showed LDL of 40.  Troponin flat 22-21 continue home aspirin  Plavix  Zetia  and statin   Hypothyroidism TSH up check  FT4.?  Compliance. Continue home Synthroid    GERD: Continue home PPI  Obesity: Patient's Body mass index is 32.32 kg/m. : Will benefit with PCP follow-up, weight loss  healthy lifestyle and outpatient sleep evaluation.  DVT prophylaxis: enoxaparin  (LOVENOX ) injection 40 mg Start: 08/16/23 2000 Code Status:   Code Status: Full Code Family Communication: plan of care discussed with patient/I called to update daughter Bruno - no answer 1/9.  Patient status is: Remains hospitalized because of severity of illness. Level of care: Med-Surg   Dispo: The patient is from: Home alone  Anticipated disposition: SNF pending.medical stable   Objective: Vitals last 24 hrs: Vitals:   08/17/23 0945 08/17/23 1402 08/17/23 2119 08/18/23 0623  BP: (!) 140/81 138/65 (!)  104/49 104/69  Pulse: 73 68 74 78  Resp:  18 18 18   Temp: 98.2 F (36.8 C)  97.7 F (36.5 C) (!) 97.5 F (36.4 C)  TempSrc: Oral   Oral  SpO2: 95% 93% 96% 93%  Weight:      Height:       Weight change:   Physical Examination: General exam: alert awake HEENT:Oral mucosa moist, Ear/Nose WNL grossly Respiratory system: Bilaterally clear BS,no use of accessory muscle Cardiovascular system: S1 & S2 +, No JVD. Gastrointestinal system: Abdomen soft,NT,ND, BS+ Nervous System: Alert, awake, moving all extremities,and following commands. Extremities: LE edema neg,distal peripheral pulses palpable and warm.  Skin: No rashes,no icterus. MSK: Normal muscle bulk,tone, power   Medications reviewed:  Scheduled Meds:  acetaminophen   1,000 mg Oral TID   ALPRAZolam   0.5 mg Oral Once   aspirin  EC  81 mg Oral QHS   busPIRone   15 mg Oral BID   cholecalciferol   5,000 Units Oral QHS   clopidogrel   75 mg Oral QHS   diclofenac  Sodium  2 g Topical QID   divalproex   250 mg Oral QHS   dorzolamide   1 drop Both Eyes BID   enoxaparin  (LOVENOX ) injection  40 mg Subcutaneous Q24H   escitalopram   20 mg Oral Daily   ezetimibe   10 mg Oral Daily   furosemide   40 mg Oral QODAY   gabapentin   300 mg Oral Daily   levothyroxine   175 mcg Oral Q0600   metoprolol  succinate  25 mg Oral Daily   pantoprazole   40 mg Oral QAC breakfast   rosuvastatin   40 mg Oral QHS   vitamin B-12  100 mcg Oral Daily   Continuous Infusions:  cefTRIAXone  (ROCEPHIN )  IV 1 g (08/17/23 1646)    Diet Order             Diet regular Room service appropriate? Yes; Fluid consistency: Thin  Diet effective now                  Intake/Output Summary (Last 24 hours) at 08/18/2023 0829 Last data filed at 08/18/2023 0500 Gross per 24 hour  Intake 600 ml  Output 1500 ml  Net -900 ml   Net IO Since Admission: -580 mL [08/18/23 0829]  Wt Readings from Last 3 Encounters:  08/16/23 77.6 kg  06/20/23 77.6 kg  06/16/23 77.6 kg      Unresulted Labs (From admission, onward)    None     Data Reviewed: I have personally reviewed following labs and imaging studies CBC: Recent Labs  Lab 08/16/23 1358 08/16/23 1408 08/17/23 0717  WBC 5.4  --  5.5  NEUTROABS 3.2  --   --   HGB 12.9 13.3 12.8  HCT 38.5 39.0 39.8  MCV 88.1  --  88.8  PLT 152  --  150   Basic Metabolic Panel:  Recent Labs  Lab 08/16/23 1358 08/16/23 1408 08/17/23 0717  NA 137 138 134*  K 4.2 4.3 3.8  CL 103 102 97*  CO2 25  --  27  GLUCOSE 100* 92 101*  BUN 20 18 17   CREATININE 1.08* 1.30* 1.18*  CALCIUM  9.1  --  9.2   GFR: Estimated Creatinine Clearance: 35.2 mL/min (A) (by C-G formula based on SCr of 1.18 mg/dL (H)). Liver Function Tests:  Recent Labs  Lab 08/16/23 1358  AST 19  ALT 28  ALKPHOS 63  BILITOT 1.9*  PROT 6.8  ALBUMIN  4.0   No results for input(s): LIPASE, AMYLASE in the last 168 hours. No results for input(s): AMMONIA in the last 168 hours. Sepsis Labs: Recent Labs  Lab 08/16/23 1729  LATICACIDVEN 0.6   Recent Results (from the past 240 hours)  Urine Culture     Status: Abnormal (Preliminary result)   Collection Time: 08/16/23  4:13 PM   Specimen: Urine, Clean Catch  Result Value Ref Range Status   Specimen Description   Final    URINE, CLEAN CATCH Performed at [facility], [address] [place], [place] [address]    Special Requests   Final    NONE Reflexed from [id number] Performed at [facility], [address], [place], [place] [address]    Culture (A)  Final    >=100,000 COLONIES/mL GRAM NEGATIVE RODS IDENTIFICATION AND SUSCEPTIBILITIES TO FOLLOW Performed at [facility], [address] [place], [place] [address]    Report Status PENDING  Incomplete    Antimicrobials/Microbiology: Anti-infectives (From admission, onward)    Start     Dose/Rate Route Frequency Ordered Stop   08/17/23 1730  cefTRIAXone  (ROCEPHIN ) 1 g in sodium chloride  0.9 % 100 mL IVPB         1 g 200 mL/hr over 30 Minutes Intravenous Every 24 hours 08/16/23 2334 08/21/23 1729   08/16/23 1730  cefTRIAXone  (ROCEPHIN ) 2 g in sodium chloride  0.9 % 100 mL IVPB        2 g 200 mL/hr over 30 Minutes Intravenous  Once 08/16/23 1718 08/16/23 1804         Component Value Date/Time   SDES  08/16/2023 1613    URINE, CLEAN CATCH Performed at [facility], [address], [place], [place] [address]    SPECREQUEST  08/16/2023 1613    NONE Reflexed from [id number] Performed at [facility], [address], [place], [place] [address]    CULT (A) 08/16/2023 1613    >=100,000 COLONIES/mL GRAM NEGATIVE RODS IDENTIFICATION AND SUSCEPTIBILITIES TO FOLLOW Performed at [facility], [address], [place], [place] [address]    REPTSTATUS PENDING 08/16/2023 [phone]     Radiology Studies: CT ANGIO UP EXTREM LEFT W &/OR WO CONTAST Result Date: 08/16/2023 CLINICAL DATA:  Pain.  Vascular murmur. EXAM: CT ANGIOGRAPHY UPPER LEFT EXTREMITY TECHNIQUE: CT angiogram imaging of the left upper extremity was performed after the administration of intravenous contrast. Coronal and sagittal reformats were submitted. MIP reformats were submitted as well. CONTRAST:  OMNIPAQUE  IOHEXOL  350 MG/ML SOLN COMPARISON:  None Available. FINDINGS: Arterial: There severe stenosis/occlusion of the proximal 3.5 cm of the left subclavian artery secondary to calcified atherosclerotic disease. The distal subclavian, axillary, brachial, ulnar, and radial arteries appear patent to the level of the wrist. There is no dissection or aneurysm. There is also severe stenosis of the origin of the left common carotid artery near the arch of the aorta. There also severe atherosclerotic calcifications of the distal left common carotid artery. Note is made of the accessory arterial branch extending from the patent portion of the left common carotid artery to the mid left subclavian artery. There is severe focal  stenosis of the celiac artery, but visualized branches are otherwise within normal limits. Osseous: Degenerative changes are seen in the glenohumeral joint with joint space narrowing and osteophyte  formation. No acute fracture or focal osseous lesion identified. Soft tissues: No evidence for soft tissue infection, hematoma or gas. There soft tissue calcifications along the posteroinferior margin of the glenoid. There severe degenerative changes of the spine. Others: Subcentimeter renal cysts are present on the left. Colonic diverticula are present. Review of the MIP images confirms the above findings. IMPRESSION: 1. 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 2. 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 3. Severe focal stenosis of the celiac artery. 4. Colonic diverticulosis. Electronically Signed   By: Greig Pique M.D.   On: 08/16/2023 16:36   CT Head Wo Contrast Result Date: 08/16/2023 CLINICAL DATA:  Delirium. Asymmetric upper extremity pulses, less prominent on the left. Left arm and shoulder pain. EXAM: CT HEAD WITHOUT CONTRAST TECHNIQUE: Contiguous axial images were obtained from the base of the skull through the vertex without intravenous contrast. RADIATION DOSE REDUCTION: This exam was performed according to the departmental dose-optimization program which includes automated exposure control, adjustment of the mA and/or kV according to patient size and/or use of iterative reconstruction technique. COMPARISON:  MR head without contrast 11/16/2016 FINDINGS: Brain: Mild atrophy and moderate periventricular white matter hypoattenuation demonstrates continued progression. Deep brain nuclei are within normal limits. No acute or focal cortical infarct is present. No acute hemorrhage or mass lesion is present. The ventricles are proportionate to the degree of atrophy. No significant extraaxial fluid collection is  present. The brainstem and cerebellum are within normal limits. Midline structures are within normal limits. Vascular: Minimal calcifications are present within the cavernous internal carotid arteries. No hyperdense vessel is present. Skull: Calvarium is intact. No focal lytic or blastic lesions are present. No significant extracranial soft tissue lesion is present. Sinuses/Orbits: The paranasal sinuses and mastoid air cells are clear. Bilateral lens replacements are noted. Globes and orbits are otherwise unremarkable. IMPRESSION: 1. No acute intracranial abnormality or significant interval change. 2. Mild atrophy and moderate periventricular white matter disease demonstrates continued progression. This likely reflects the sequela of chronic microvascular ischemia. Electronically Signed   By: Lonni Necessary M.D.   On: 08/16/2023 15:19     LOS: 1 day   Total time spent in review of labs and imaging, patient evaluation, formulation of plan, documentation and communication with family: 35 minutes  Mennie LAMY, MD [facility]  08/18/2023, 8:29 AM

## 2023-08-18 NOTE — NC FL2 (Signed)
 [place]  MEDICAID FL2 LEVEL OF CARE FORM     IDENTIFICATION  Patient Name: Deanna [name] Birthdate: [birth year] Sex: female Admission Date (Current Location): 08/16/2023  [facility] and [place] Number:  Producer, Television/film/video and Address:  [facility],  [address], [place] [address]      Provider Number: [id number]  Attending Physician Name and Address:  Christobal Guadalajara, MD  Relative Name and Phone Number:  daughter, Sabra Sessler [phone]    Current Level of Care: Hospital Recommended Level of Care: Skilled Nursing Facility Prior Approval Number:    Date Approved/Denied:   PASRR Number: [id number]  Discharge Plan: SNF    Current Diagnoses: Patient Active Problem List   Diagnosis Date Noted   Left arm pain 08/17/2023   Delirium 08/17/2023   UTI (urinary tract infection) 08/16/2023   Acute respiratory failure with hypoxia (HCC) 06/17/2022   COVID-19 virus infection 06/17/2022   Stage 3b chronic kidney disease (CKD) (HCC) - baseline SCr 1.8 06/17/2022   PAD (peripheral artery disease) (HCC) 10/11/2021   PAF (paroxysmal atrial fibrillation) (HCC) 10/11/2021   Hypokalemia 05/09/2021   Status post coronary artery stent placement    Overactive bladder 12/16/2019   Generalized anxiety disorder 06/17/2018   Mood disorder (HCC) 06/17/2018   Chronic diastolic CHF (congestive heart failure) (HCC) 11/16/2017   OSA on CPAP 04/26/2015   Chronic pain 04/26/2015   Physical deconditioning 04/26/2015   Hyperlipidemia 07/02/2014   Carotid artery stenosis 03/26/2012   Shortness of breath 12/19/2011   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, 2011 11/28/2011   History of colonic polyps 06/18/2010   Hypothyroidism 11/17/2008   Obesity, Class III, BMI 40-49.9 (morbid obesity) (HCC) 11/17/2008   ANXIETY DEPRESSION 11/17/2008   Essential hypertension 11/17/2008   HEMORRHOIDS 11/17/2008   GERD 11/17/2008   IBS 11/17/2008   Atherosclerosis of coronary  artery bypass graft(s), unspecified, with other forms of angina pectoris (HCC)     Orientation RESPIRATION BLADDER Height & Weight     Self, Place  Normal Incontinent Weight: 171 lb 1.2 oz (77.6 kg) Height:  5' 1 (154.9 cm)  BEHAVIORAL SYMPTOMS/MOOD NEUROLOGICAL BOWEL NUTRITION STATUS      Continent Diet (regular)  AMBULATORY STATUS COMMUNICATION OF NEEDS Skin   Limited Assist Verbally Normal                       Personal Care Assistance Level of Assistance  Bathing, Dressing Bathing Assistance: Limited assistance   Dressing Assistance: Limited assistance     Functional Limitations Info  Sight, Hearing, Speech Sight Info: Adequate Hearing Info: Adequate Speech Info: Adequate    SPECIAL CARE FACTORS FREQUENCY  PT (By licensed PT), OT (By licensed OT)     PT Frequency: 5x/wk OT Frequency: 5x/wk            Contractures Contractures Info: Not present    Additional Factors Info  Code Status, Allergies, Psychotropic Code Status Info: full Allergies Info: Lidocaine , Cymbalta  (Duloxetine  Hcl), Donepezil, Mobic (Meloxicam), Namenda (Memantine), Nsaids, Other, Oxycontin  (Oxycodone ), Vicodin (Hydrocodone-acetaminophen ) Psychotropic Info: see MAR         Current Medications (08/18/2023):  This is the current hospital active medication list Current Facility-Administered Medications  Medication Dose Route Frequency Provider Last Rate Last Admin   acetaminophen  (TYLENOL ) tablet 1,000 mg  1,000 mg Oral TID Amponsah, Prosper M, MD   1,000 mg at 08/18/23 9094   ALPRAZolam  (XANAX ) tablet 0.5 mg  0.5 mg Oral  Daily PRN Amponsah, Prosper M, MD   0.5 mg at 08/17/23 1036   ALPRAZolam  (XANAX ) tablet 0.5 mg  0.5 mg Oral Once Chavez, Abigail, NP       aspirin  EC tablet 81 mg  81 mg Oral QHS Amponsah, Prosper M, MD   81 mg at 08/17/23 2111   busPIRone  (BUSPAR ) tablet 15 mg  15 mg Oral BID Amponsah, Prosper M, MD   15 mg at 08/18/23 0905   cefTRIAXone  (ROCEPHIN ) 1 g in sodium  chloride 0.9 % 100 mL IVPB  1 g Intravenous Q24H Amponsah, Prosper M, MD 200 mL/hr at 08/17/23 1646 1 g at 08/17/23 1646   cholecalciferol  (VITAMIN D3) 25 MCG (1000 UNIT) tablet 5,000 Units  5,000 Units Oral QHS Amponsah, Prosper M, MD   5,000 Units at 08/17/23 2110   clopidogrel  (PLAVIX ) tablet 75 mg  75 mg Oral QHS Amponsah, Prosper M, MD   75 mg at 08/17/23 2110   diclofenac  Sodium (VOLTAREN ) 1 % topical gel 2 g  2 g Topical QID Amponsah, Prosper M, MD   2 g at 08/18/23 9094   divalproex  (DEPAKOTE  ER) 24 hr tablet 250 mg  250 mg Oral QHS Amponsah, Prosper M, MD   250 mg at 08/17/23 2111   dorzolamide  (TRUSOPT ) 2 % ophthalmic solution 1 drop  1 drop Both Eyes BID Amponsah, Prosper M, MD   1 drop at 08/18/23 9093   enoxaparin  (LOVENOX ) injection 40 mg  40 mg Subcutaneous Q24H Amponsah, Prosper M, MD   40 mg at 08/17/23 2110   escitalopram  (LEXAPRO ) tablet 20 mg  20 mg Oral Daily Amponsah, Prosper M, MD   20 mg at 08/18/23 9094   ezetimibe  (ZETIA ) tablet 10 mg  10 mg Oral Daily Amponsah, Prosper M, MD   10 mg at 08/18/23 9093   furosemide  (LASIX ) tablet 40 mg  40 mg Oral QODAY Amponsah, Prosper M, MD   40 mg at 08/17/23 0944   gabapentin  (NEURONTIN ) capsule 300 mg  300 mg Oral Daily Amponsah, Prosper M, MD   300 mg at 08/18/23 9094   levothyroxine  (SYNTHROID ) tablet 175 mcg  175 mcg Oral Q0600 Lou Claretta HERO, MD   175 mcg at 08/18/23 9389   metoprolol  succinate (TOPROL -XL) 24 hr tablet 25 mg  25 mg Oral Daily Amponsah, Prosper M, MD   25 mg at 08/18/23 9094   ondansetron  (ZOFRAN ) tablet 4 mg  4 mg Oral Q6H PRN Amponsah, Prosper M, MD       Or   ondansetron  (ZOFRAN ) injection 4 mg  4 mg Intravenous Q6H PRN Amponsah, Prosper M, MD       pantoprazole  (PROTONIX ) EC tablet 40 mg  40 mg Oral QAC breakfast Amponsah, Prosper M, MD   40 mg at 08/18/23 9095   rosuvastatin  (CRESTOR ) tablet 40 mg  40 mg Oral QHS Amponsah, Prosper M, MD   40 mg at 08/17/23 2111   senna-docusate (Senokot-S) tablet 1 tablet   1 tablet Oral QHS PRN Lou Claretta HERO, MD       vitamin B-12 (CYANOCOBALAMIN ) tablet 100 mcg  100 mcg Oral Daily Amponsah, Prosper M, MD   100 mcg at 08/18/23 9094     Discharge Medications: Please see discharge summary for a list of discharge medications.  Relevant Imaging Results:  Relevant Lab Results:   Additional Information [id number]  NORMAN ASPEN, LCSW

## 2023-08-18 NOTE — Plan of Care (Signed)
  Problem: Nutrition: Goal: Adequate nutrition will be maintained Outcome: Completed/Met   Problem: Coping: Goal: Level of anxiety will decrease Outcome: Progressing

## 2023-08-18 NOTE — TOC Initial Note (Signed)
 Transition of Care [facility]) - Initial/Assessment Note    Patient Details  Name: Deanna [name] MRN: [id number] Date of Birth: May 19, 1941  [facility]) CM/SW Contact:    NORMAN ASPEN, LCSW Phone Number: 08/18/2023, 2:39 PM  Clinical Narrative:                  Met with pt and spoke with daughter, Bruno, on phone today to review dc planning needs.  Pt pleasant and oriented to person and place but unsure why she is hospitalized.  She asks if I will call daughter and have her come to the hospital. Contacted daughter to gather background information.  Daughter confirms that she has been living with her mother for ~ 11 yrs to provide support.  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  She does note that providing supervision to patient has been difficult and pt has resisted options such as Adult day care programs.  She does feel that pt's overall functional level has certainly declined over the past few days and she is requiring more physical support.  She is in agreement with recommendation for short term SNF.  Discussed need to secure insurance authorization but will start SNF bed search. Expected Discharge Plan: Skilled Nursing Facility Barriers to Discharge: Continued Medical Work up, SNF Pending bed offer, Insurance Authorization   Patient Goals and CMS Choice Patient states their goals for this hospitalization and ongoing recovery are::  go home   Choice offered to / list presented to : Adult Children      Expected Discharge Plan and Services In-house Referral: Clinical Social Work   Post Acute Care Choice: Skilled Nursing Facility Living arrangements for the past 2 months: Single Family Home                                      Prior Living Arrangements/Services Living arrangements for the past 2 months: Single Family Home Lives with:: Adult Children Patient language and  need for interpreter reviewed:: Yes Do you feel safe going back to the place where you live?: Yes      Need for Family Participation in Patient Care: Yes (Comment) Care giver support system in place?: Yes (comment)   Criminal Activity/Legal Involvement Pertinent to Current Situation/Hospitalization: No - Comment as needed  Activities of Daily Living   ADL Screening (condition at time of admission) Independently performs ADLs?: No Does the patient have a NEW difficulty with bathing/dressing/toileting/self-feeding that is expected to last >3 days?: Yes (Initiates electronic notice to provider for possible OT consult) Does the patient have a NEW difficulty with getting in/out of bed, walking, or climbing stairs that is expected to last >3 days?: Yes (Initiates electronic notice to provider for possible PT consult) Does the patient have a NEW difficulty with communication that is expected to last >3 days?: No Is the patient deaf or have difficulty hearing?: No Does the patient have difficulty seeing, even when wearing glasses/contacts?: No Does the patient have difficulty concentrating, remembering, or making decisions?: Yes  Permission Sought/Granted Permission sought to share information with : Family Supports Permission granted to share information with : Yes, Verbal Permission Granted  Share Information with NAME: daughter, Donice Alperin @ [phone]           Emotional Assessment Appearance:: Appears stated age Attitude/Demeanor/Rapport:  Gracious Affect (typically observed): Accepting Orientation: : Oriented to Self, Oriented to Place Alcohol / Substance Use: Not Applicable Psych Involvement: No (comment)  Admission diagnosis:  UTI (urinary tract infection) [N39.0] Patient Active Problem List   Diagnosis Date Noted   Left arm pain 08/17/2023   Delirium 08/17/2023   UTI (urinary tract infection) 08/16/2023   Acute respiratory failure with hypoxia (HCC) 06/17/2022    COVID-19 virus infection 06/17/2022   Stage 3b chronic kidney disease (CKD) (HCC) - baseline SCr 1.8 06/17/2022   PAD (peripheral artery disease) (HCC) 10/11/2021   PAF (paroxysmal atrial fibrillation) (HCC) 10/11/2021   Hypokalemia 05/09/2021   Status post coronary artery stent placement    Overactive bladder 12/16/2019   Generalized anxiety disorder 06/17/2018   Mood disorder (HCC) 06/17/2018   Chronic diastolic CHF (congestive heart failure) (HCC) 11/16/2017   OSA on CPAP 04/26/2015   Chronic pain 04/26/2015   Physical deconditioning 04/26/2015   Hyperlipidemia 07/02/2014   Carotid artery stenosis 03/26/2012   Shortness of breath 12/19/2011    Class: Acute   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, 2011 11/28/2011   History of colonic polyps 06/18/2010   Hypothyroidism 11/17/2008   Obesity, Class III, BMI 40-49.9 (morbid obesity) (HCC) 11/17/2008   ANXIETY DEPRESSION 11/17/2008   Essential hypertension 11/17/2008   HEMORRHOIDS 11/17/2008   GERD 11/17/2008   IBS 11/17/2008   Atherosclerosis of coronary artery bypass graft(s), unspecified, with other forms of angina pectoris (HCC)    PCP:  Caro [name] POUR, NP Pharmacy:   [facility] [phone] [address], [place] - [address] AT [facility] [address] [place] [address] Phone: [phone] Fax: [phone]     Social Drivers of Health (SDOH) Social History: SDOH Screenings   Food Insecurity: No Food Insecurity (06/17/2022)  Housing: Low Risk  (06/17/2022)  Transportation Needs: No Transportation Needs (04/24/2023)   Received from [facility]  Utilities: Not At Risk (06/17/2022)  Depression (PHQ2-9): Low Risk  (05/16/2022)  Tobacco Use: Medium Risk (08/16/2023)   SDOH Interventions:     Readmission Risk Interventions    08/18/2023    2:28 PM  Readmission Risk Prevention Plan  Post Dischage Appt Complete  Medication Screening Complete   Transportation Screening Complete

## 2023-08-18 NOTE — Progress Notes (Signed)
 Mobility Specialist - Progress Note   08/18/23 0937  Mobility  Activity Ambulated with assistance in hallway  Level of Assistance Standby assist, set-up cues, supervision of patient - no hands on  Assistive Device Front wheel walker  Distance Ambulated (ft) 120 ft  Activity Response Tolerated well  Mobility Referral Yes  Mobility visit 1 Mobility  Mobility Specialist Start Time (ACUTE ONLY) 0920  Mobility Specialist Stop Time (ACUTE ONLY) 0936  Mobility Specialist Time Calculation (min) (ACUTE ONLY) 16 min   Pt received in bed and agreeable to mobility. No complaints during session. Pt to chair after session with all needs met. Chair alarm on.   [facility]

## 2023-08-19 DIAGNOSIS — N3000 Acute cystitis without hematuria: Secondary | ICD-10-CM | POA: Diagnosis not present

## 2023-08-19 NOTE — Progress Notes (Signed)
   08/19/23 2300  BiPAP/CPAP/SIPAP  Reason BIPAP/CPAP not in use Non-compliant (patient does not want to wear)

## 2023-08-19 NOTE — Plan of Care (Signed)
   Problem: Coping: Goal: Level of anxiety will decrease Outcome: Progressing

## 2023-08-19 NOTE — Progress Notes (Signed)
 PROGRESS NOTE Deanna [name]  FMW:[id number] DOB: 03-14-1941 DOA: 08/16/2023 PCP: Caro [name] POUR, NP  Brief Narrative/Hospital Course: 5F w/ hx of TBI c/b mild cognitive deficit/dementia brought in by daughter due to recent behavior and mental status change-over past 2 weeks patient has been very demanding and sometimes calling hide up to 20 times a day and has been more agitated over the past 2 days PTA. In ED: Vitals stable afebrile labs,normal CBC, sodium 137, K+ 4.2, glucose 100, creatinine 1.08, troponin 22-21, lactic acid 0.6, UA shows negative nitrite, moderate leuks, WBC 21-50 but no bacteria > diagnosed with UTI with acute metabolic encephalopathy and admitted for further management. Urine culture growing E. coli sensitive to cefazolin  and ceftriaxone , antibiotic changed to p.o. regimen.  Mental status overall better, remains deconditioned weak and PT OT recommending skilled nursing facility  Subjective: Seen and examined Resting well Having her breakfast, pleasantly confused Overnight afebrile BP stable  Urine culture finalized   Assessment and Plan: Principal Problem:   UTI (urinary tract infection) Active Problems:   Left arm pain   Delirium   E. coli UTI/acute cystitis POA: Continue antibiotics as per C/S, sensitive to cefazolin /Rocephin  will transition to p.o. regimen   Acute metabolic encephalopathy TBI W/ Cognitive impairment ?Dementia at baseline Anxiety and depression Patient has had intermittent confusion, and recent behavior changes past 2 weeks, wandering through her neighbor's house and that her daughter is trying to kill her.  Unsure if patient is safe to stay at home with progression of her cognitive impairment> mental status worsened by UTI. Vit D2 32 b12 309 TSH 6.5-but normal  free T4.  Continue underlying UTI treatment overall mentation stable and plan is for skilled nursing facility.  Alert awake oriented to self place, knows this is near, overall  mentation improved at baseline.Continue delirium precaution. Continue her home Lexapro , Depakote , BuSpar  and as needed Xanax .Depakote  level low unsure of compliance will need recheck soon.  CKD 3a Mild hyponatremia: Renal function is stable.  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 Vascular surgery consulted and noted that all these findings are chronic. Vascular insufficiency not the culprit for her left shoulder pain. Vascular surgery following, appreciate, Continue Plavix , rosuvastatin  and aspirin    Chronic left shoulder pain: Recent imaging of the left shoulder 2 months ago showed osteoarthritis of the shoulder with 2 cm loose body in the axillary pouch. Patient recently received injection to the shoulder by orthopedic surgery. Havin shoulder pain worse with lifting of the arm. Continue Tylenol  scheduled, Voltaren  gel, has a follow-up with orthopedic surgery will need to postpone  Chronic combined systolic and systolic HF: Volume status stable.  Continue home Lasix  every other day   HTN BP stable on Toprol   25   CAD HLD Lipid panel 2 months ago showed LDL of 40.  Troponin flat 22-21 continue home aspirin  Plavix  Zetia  and statin   Hypothyroidism TSH up check  FT4.?  Compliance. Continue home Synthroid    GERD: Continue home PPI  Obesity: Patient's Body mass index is 32.32 kg/m. : Will benefit with PCP follow-up, weight loss  healthy lifestyle and outpatient sleep evaluation.  DVT prophylaxis: enoxaparin  (LOVENOX ) injection 40 mg Start: 08/16/23 2000 Code Status:   Code Status: Full Code Family Communication: plan of care discussed with  patient/I called to update daughter Bruno - no answer 1/9.  Patient status is: Remains hospitalized because  of severity of illness. Level of care: Med-Surg   Dispo: The patient is from: Home alone            Anticipated disposition: Awaiting SNF  Objective: Vitals last 24 hrs: Vitals:   08/18/23 0623 08/18/23 1325 08/18/23 2134 08/19/23 0624  BP: 104/69 (!) 149/71 (!) 156/65 (!) 158/88  Pulse: 78 68 73 67  Resp: 18 18 16 17   Temp: (!) 97.5 F (36.4 C)  97.7 F (36.5 C) 97.6 F (36.4 C)  TempSrc: Oral  Oral Oral  SpO2: 93% 94% 93% 95%  Weight:      Height:       Weight change:   Physical Examination: General exam: alert awake, oriented x2-3 HEENT:Oral mucosa moist, Ear/Nose WNL grossly Respiratory system: Bilaterally clear BS,no use of accessory muscle Cardiovascular system: S1 & S2 +, No JVD. Gastrointestinal system: Abdomen soft,NT,ND, BS+ Nervous System: Alert, awake, moving all extremities,and following commands. Extremities: LE edema neg,distal peripheral pulses palpable and warm.  Skin: No rashes,no icterus. MSK: Normal muscle bulk,tone, power    Medications reviewed:  Scheduled Meds:  acetaminophen   1,000 mg Oral TID   aspirin  EC  81 mg Oral QHS   busPIRone   15 mg Oral BID   cholecalciferol   5,000 Units Oral QHS   clopidogrel   75 mg Oral QHS   diclofenac  Sodium  2 g Topical QID   divalproex   250 mg Oral QHS   dorzolamide   1 drop Both Eyes BID   enoxaparin  (LOVENOX ) injection  40 mg Subcutaneous Q24H   escitalopram   20 mg Oral Daily   ezetimibe   10 mg Oral Daily   furosemide   40 mg Oral QODAY   gabapentin   300 mg Oral Daily   levothyroxine   175 mcg Oral Q0600   metoprolol  succinate  25 mg Oral Daily   pantoprazole   40 mg Oral QAC breakfast   rosuvastatin   40 mg Oral QHS   vitamin B-12  100 mcg Oral Daily   Continuous Infusions:  cefTRIAXone  (ROCEPHIN )  IV 1 g (08/18/23 1733)    Diet Order             Diet regular Room service appropriate? Yes; Fluid consistency: Thin  Diet effective now                  Intake/Output Summary (Last 24 hours) at  08/19/2023 1032 Last data filed at 08/19/2023 0945 Gross per 24 hour  Intake 1410 ml  Output 1700 ml  Net -290 ml   Net IO Since Admission: -980 mL [08/19/23 1032]  Wt Readings from Last 3 Encounters:  08/16/23 77.6 kg  06/20/23 77.6 kg  06/16/23 77.6 kg     Unresulted Labs (From admission, onward)    None     Data Reviewed: I have personally reviewed following labs and imaging studies CBC: Recent Labs  Lab 08/16/23 1358 08/16/23 1408 08/17/23 0717  WBC 5.4  --  5.5  NEUTROABS 3.2  --   --   HGB 12.9 13.3 12.8  HCT 38.5 39.0 39.8  MCV 88.1  --  88.8  PLT 152  --  150   Basic Metabolic Panel:  Recent Labs  Lab 08/16/23 1358 08/16/23 1408 08/17/23 0717  NA 137 138 134*  K 4.2 4.3 3.8  CL 103 102 97*  CO2 25  --  27  GLUCOSE 100* 92 101*  BUN 20 18 17   CREATININE 1.08* 1.30* 1.18*  CALCIUM  9.1  --  9.2   GFR: Estimated Creatinine Clearance: 35.2 mL/min (A) (by C-G formula based on SCr of 1.18 mg/dL (H)). Liver Function Tests:  Recent Labs  Lab 08/16/23 1358  AST 19  ALT 28  ALKPHOS 63  BILITOT 1.9*  PROT 6.8  ALBUMIN  4.0   No results for input(s): LIPASE, AMYLASE in the last 168 hours. No results for input(s): AMMONIA in the last 168 hours. Sepsis Labs: Recent Labs  Lab 08/16/23 1729  LATICACIDVEN 0.6   Recent Results (from the past 240 hours)  Urine Culture     Status: Abnormal   Collection Time: 08/16/23  4:13 PM   Specimen: Urine, Clean Catch  Result Value Ref Range Status   Specimen Description   Final    URINE, CLEAN CATCH Performed at [facility], [address] [place], [place] [address]    Special Requests   Final    NONE Reflexed from [id number] Performed at [facility], [address] [place], [place] [address]    Culture >=100,000 COLONIES/mL ESCHERICHIA COLI (A)  Final   Report Status 08/18/2023 FINAL  Final   Organism ID, Bacteria ESCHERICHIA COLI (A)  Final      Susceptibility   Escherichia coli  - MIC*    AMPICILLIN >=32 RESISTANT Resistant     CEFAZOLIN  <=4 SENSITIVE Sensitive     CEFEPIME <=0.12 SENSITIVE Sensitive     CEFTRIAXONE  <=0.25 SENSITIVE Sensitive     CIPROFLOXACIN  <=0.25 SENSITIVE Sensitive     GENTAMICIN <=1 SENSITIVE Sensitive     IMIPENEM <=0.25 SENSITIVE Sensitive     NITROFURANTOIN <=16 SENSITIVE Sensitive     TRIMETH /SULFA  <=20 SENSITIVE Sensitive     AMPICILLIN/SULBACTAM >=32 RESISTANT Resistant     PIP/TAZO <=4 SENSITIVE Sensitive ug/mL    * >=100,000 COLONIES/mL ESCHERICHIA COLI    Antimicrobials/Microbiology: Anti-infectives (From admission, onward)    Start     Dose/Rate Route Frequency Ordered Stop   08/17/23 1730  cefTRIAXone  (ROCEPHIN ) 1 g in sodium chloride  0.9 % 100 mL IVPB        1 g 200 mL/hr over 30 Minutes Intravenous Every 24 hours 08/16/23 2334 08/21/23 1729   08/16/23 1730  cefTRIAXone  (ROCEPHIN ) 2 g in sodium chloride  0.9 % 100 mL IVPB        2 g 200 mL/hr over 30 Minutes Intravenous  Once 08/16/23 1718 08/16/23 1804         Component Value Date/Time   SDES  08/16/2023 1613    URINE, CLEAN CATCH Performed at [facility], [address], [place], [place] [address]    SPECREQUEST  08/16/2023 1613    NONE Reflexed from [id number] Performed at [facility], [address], [place], [place] [address]    CULT >=100,000 COLONIES/mL ESCHERICHIA COLI (A) 08/16/2023 1613   REPTSTATUS 08/18/2023 FINAL 08/16/2023 1613     Radiology Studies: No results found.    LOS: 2 days   Total time spent in review of labs and imaging, patient evaluation, formulation of plan, documentation and communication with family: 35 minutes  Mennie LAMY, MD [facility]  08/19/2023, 10:32 AM

## 2023-08-20 DIAGNOSIS — N3000 Acute cystitis without hematuria: Secondary | ICD-10-CM | POA: Diagnosis not present

## 2023-08-20 NOTE — Progress Notes (Signed)
   08/20/23 1942  BiPAP/CPAP/SIPAP  Reason BIPAP/CPAP not in use Non-compliant (Patient refused CPAP, states she does not wear one at home and does not want to wear)

## 2023-08-20 NOTE — Progress Notes (Signed)
 PROGRESS NOTE Deanna [name]  FMW:[id number] DOB: Jun 23, 1941 DOA: 08/16/2023 PCP: Caro [name] POUR, NP  Brief Narrative/Hospital Course: 46F w/ hx of TBI c/b mild cognitive deficit/dementia brought in by daughter due to recent behavior and mental status change-over past 2 weeks patient has been very demanding and sometimes calling hide up to 20 times a day and has been more agitated over the past 2 days PTA. In ED: Vitals stable afebrile labs,normal CBC, sodium 137, K+ 4.2, glucose 100, creatinine 1.08, troponin 22-21, lactic acid 0.6, UA shows negative nitrite, moderate leuks, WBC 21-50 but no bacteria > diagnosed with UTI with acute metabolic encephalopathy and admitted for further management. Urine culture growing E. coli sensitive to cefazolin  and ceftriaxone , antibiotic changed to p.o. regimen.  Mental status overall better, remains deconditioned weak and PT OT recommending skilled nursing facility  Subjective: Seen and examined  Alert awake oriented to self place knows this is  2025  Assessment and Plan: Principal Problem:   UTI (urinary tract infection) Active Problems:   Left arm pain   Delirium   E. coli UTI/acute cystitis POA: Continue antibiotics as per C/S, sensitive to cefazolin /Rocephin  will transition to p.o. regimen on discharge  Acute metabolic encephalopathy TBI W/ Cognitive impairment ?Dementia at baseline Anxiety and depression Patient has had intermittent confusion, and recent behavior changes past 2 weeks, wandering through her neighbor's house and that her daughter is trying to kill her.  Unsure if patient is safe to stay at home with progression of her cognitive impairment> mental status worsened by UTI. Vit D2 32 b12 309 TSH 6.5-but normal  free T4.   Continue antibiotics for UTI and mentation over stable although with pleasant confusion-baseline. Continue delirium precaution fall precaution Continue her home Lexapro , Depakote , BuSpar  and as needed  Xanax .Depakote  level low unsure of compliance will need recheck soon.  CKD 3a Mild hyponatremia: Renal function is stable.  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 Vascular surgery consulted and noted that all these findings are chronic. Vascular insufficiency not the culprit for her left shoulder pain. Vascular surgery following, appreciate, Continue Plavix , rosuvastatin  and aspirin    Chronic left shoulder pain: Recent imaging of the left shoulder 2 months ago showed osteoarthritis of the shoulder with 2 cm loose body in the axillary pouch. Patient recently received injection to the shoulder by orthopedic surgery. Havin shoulder pain worse with lifting of the arm. Continue Tylenol  scheduled, Voltaren  gel, has a follow-up with orthopedic surgery as outpatient  Chronic combined systolic and systolic HF: Volume status stable.  Continue home Lasix  every other day   HTN BP stable on Toprol   25   CAD HLD Lipid panel 2 months ago showed LDL of 40.  Troponin flat 22-21 continue home aspirin  Plavix  Zetia  and statin   Hypothyroidism TSH up check  FT4.?  Compliance. Continue home Synthroid    GERD: Continue home PPI  Obesity: Patient's Body mass index is 32.32 kg/m. : Will benefit with PCP follow-up, weight loss  healthy lifestyle and outpatient sleep evaluation.  DVT prophylaxis: enoxaparin  (LOVENOX ) injection 40 mg Start: 08/16/23 2000 Code Status:   Code Status: Full Code Family Communication: plan of care discussed with patient/I called to update daughter Bruno - no answer 1/9.  Patient status is: Remains hospitalized because of severity  of illness. Level of care: Med-Surg   Dispo: The patient is from: Home alone            Anticipated  disposition: Awaiting SNF  Objective: Vitals last 24 hrs: Vitals:   08/19/23 0624 08/19/23 1450 08/19/23 2022 08/20/23 0615  BP: (!) 158/88 136/62 (!) 84/61 (!) 102/56  Pulse: 67 68 73 (!) 59  Resp: 17 18 16 15   Temp: 97.6 F (36.4 C) 97.7 F (36.5 C) 98.2 F (36.8 C) 98.4 F (36.9 C)  TempSrc: Oral Oral Oral   SpO2: 95% 94% 96% 98%  Weight:      Height:       Weight change:   Physical Examination: General exam: alert awake, oriented to self place  HEENT:Oral mucosa moist, Ear/Nose WNL grossly Respiratory system: Bilaterally clear BS,no use of accessory muscle Cardiovascular system: S1 & S2 +, No JVD. Gastrointestinal system: Abdomen soft,NT,ND, BS+ Nervous System: Alert, awake, moving all extremities,and following commands. Extremities: LE edema neg,distal peripheral pulses palpable and warm.  Skin: No rashes,no icterus. MSK: Normal muscle bulk,tone, power     Medications reviewed:  Scheduled Meds:  acetaminophen   1,000 mg Oral TID   aspirin  EC  81 mg Oral QHS   busPIRone   15 mg Oral BID   cholecalciferol   5,000 Units Oral QHS   clopidogrel   75 mg Oral QHS   diclofenac  Sodium  2 g Topical QID   divalproex   250 mg Oral QHS   dorzolamide   1 drop Both Eyes BID   enoxaparin  (LOVENOX ) injection  40 mg Subcutaneous Q24H   escitalopram   20 mg Oral Daily   ezetimibe   10 mg Oral Daily   furosemide   40 mg Oral QODAY   gabapentin   300 mg Oral Daily   levothyroxine   175 mcg Oral Q0600   metoprolol  succinate  25 mg Oral Daily   pantoprazole   40 mg Oral QAC breakfast   rosuvastatin   40 mg Oral QHS   vitamin B-12  100 mcg Oral Daily   Continuous Infusions:  cefTRIAXone  (ROCEPHIN )  IV 1 g (08/19/23 1730)    Diet Order             Diet regular Room service appropriate? Yes; Fluid consistency: Thin  Diet effective now                  Intake/Output Summary (Last 24 hours) at 08/20/2023 0853 Last data filed at 08/20/2023 0615 Gross per 24 hour  Intake 1160 ml   Output 1150 ml  Net 10 ml   Net IO Since Admission: -1,010 mL [08/20/23 0853]  Wt Readings from Last 3 Encounters:  08/16/23 77.6 kg  06/20/23 77.6 kg  06/16/23 77.6 kg     Unresulted Labs (From admission, onward)    None     Data Reviewed: I have personally reviewed following labs and imaging studies CBC: Recent Labs  Lab 08/16/23 1358 08/16/23 1408 08/17/23 0717  WBC 5.4  --  5.5  NEUTROABS 3.2  --   --   HGB 12.9 13.3 12.8  HCT 38.5 39.0 39.8  MCV 88.1  --  88.8  PLT 152  --  150   Basic Metabolic Panel:  Recent Labs  Lab 08/16/23 1358 08/16/23 1408 08/17/23 0717  NA 137 138 134*  K 4.2 4.3 3.8  CL 103 102 97*  CO2 25  --  27  GLUCOSE 100* 92 101*  BUN 20 18 17   CREATININE 1.08* 1.30* 1.18*  CALCIUM  9.1  --  9.2   GFR: Estimated Creatinine Clearance: 35.2 mL/min (A) (by C-G formula based on SCr of 1.18 mg/dL (H)). Liver Function Tests:  Recent Labs  Lab 08/16/23 1358  AST 19  ALT 28  ALKPHOS 63  BILITOT 1.9*  PROT 6.8  ALBUMIN  4.0   No results for input(s): LIPASE, AMYLASE in the last 168 hours. No results for input(s): AMMONIA in the last 168 hours. Sepsis Labs: Recent Labs  Lab 08/16/23 1729  LATICACIDVEN 0.6   Recent Results (from the past 240 hours)  Urine Culture     Status: Abnormal   Collection Time: 08/16/23  4:13 PM   Specimen: Urine, Clean Catch  Result Value Ref Range Status   Specimen Description   Final    URINE, CLEAN CATCH Performed at [facility], [address] [place], [place] [address]    Special Requests   Final    NONE Reflexed from [id number] Performed at [facility], [address] [place], [place] [address]    Culture >=100,000 COLONIES/mL ESCHERICHIA COLI (A)  Final   Report Status 08/18/2023 FINAL  Final   Organism ID, Bacteria ESCHERICHIA COLI (A)  Final      Susceptibility   Escherichia coli - MIC*    AMPICILLIN >=32 RESISTANT Resistant     CEFAZOLIN  <=4 SENSITIVE Sensitive      CEFEPIME <=0.12 SENSITIVE Sensitive     CEFTRIAXONE  <=0.25 SENSITIVE Sensitive     CIPROFLOXACIN  <=0.25 SENSITIVE Sensitive     GENTAMICIN <=1 SENSITIVE Sensitive     IMIPENEM <=0.25 SENSITIVE Sensitive     NITROFURANTOIN <=16 SENSITIVE Sensitive     TRIMETH /SULFA  <=20 SENSITIVE Sensitive     AMPICILLIN/SULBACTAM >=32 RESISTANT Resistant     PIP/TAZO <=4 SENSITIVE Sensitive ug/mL    * >=100,000 COLONIES/mL ESCHERICHIA COLI    Antimicrobials/Microbiology: Anti-infectives (From admission, onward)    Start     Dose/Rate Route Frequency Ordered Stop   08/17/23 1730  cefTRIAXone  (ROCEPHIN ) 1 g in sodium chloride  0.9 % 100 mL IVPB        1 g 200 mL/hr over 30 Minutes Intravenous Every 24 hours 08/16/23 2334 08/21/23 1729   08/16/23 1730  cefTRIAXone  (ROCEPHIN ) 2 g in sodium chloride  0.9 % 100 mL IVPB        2 g 200 mL/hr over 30 Minutes Intravenous  Once 08/16/23 1718 08/16/23 1804         Component Value Date/Time   SDES  08/16/2023 1613    URINE, CLEAN CATCH Performed at [facility], [address], [place], [place] [address]    SPECREQUEST  08/16/2023 1613    NONE Reflexed from [id number] Performed at [facility], [address], [place], [place] [address]    CULT >=100,000 COLONIES/mL ESCHERICHIA COLI (A) 08/16/2023 1613   REPTSTATUS 08/18/2023 FINAL 08/16/2023 1613     Radiology Studies: No results found.    LOS: 3 days   Total time spent in review of labs and imaging, patient evaluation, formulation of plan, documentation and communication with family: 35 minutes  Mennie LAMY, MD [facility]  08/20/2023, 8:53 AM

## 2023-08-20 NOTE — Plan of Care (Signed)
   Problem: Coping: Goal: Level of anxiety will decrease Outcome: Progressing

## 2023-08-21 DIAGNOSIS — N3000 Acute cystitis without hematuria: Secondary | ICD-10-CM | POA: Diagnosis not present

## 2023-08-21 NOTE — Plan of Care (Signed)
  Problem: Coping: Goal: Level of anxiety will decrease Outcome: Progressing   Problem: Education: Goal: Knowledge of General Education information will improve Description: Including pain rating scale, medication(s)/side effects and non-pharmacologic comfort measures Outcome: Not Progressing

## 2023-08-21 NOTE — Plan of Care (Signed)
 ?  Problem: Clinical Measurements: ?Goal: Will remain free from infection ?Outcome: Progressing ?  ?

## 2023-08-21 NOTE — Progress Notes (Signed)
 PROGRESS NOTE Deanna [name]  FMW:[id number] DOB: 12/20/1940 DOA: 08/16/2023 PCP: Caro [name] POUR, NP  Brief Narrative/Hospital Course: 62F w/ hx of TBI c/b mild cognitive deficit/dementia brought in by daughter due to recent behavior and mental status change-over past 2 weeks patient has been very demanding and sometimes calling hide up to 20 times a day and has been more agitated over the past 2 days PTA. In ED: Vitals stable afebrile labs,normal CBC, sodium 137, K+ 4.2, glucose 100, creatinine 1.08, troponin 22-21, lactic acid 0.6, UA shows negative nitrite, moderate leuks, WBC 21-50 but no bacteria > diagnosed with UTI with acute metabolic encephalopathy and admitted for further management. Urine culture growing E. coli sensitive to cefazolin  and ceftriaxone , antibiotic changed to p.o. regimen.  Mental status overall better, remains deconditioned weak and PT OT recommending skilled nursing facility  Subjective: Patient seen examined Alert awake pleasantly confused at baseline  Follows commands appropriately   Assessment and Plan: Principal Problem:   UTI (urinary tract infection) Active Problems:   Left arm pain   Delirium   E. coli UTI/acute cystitis POA: Patient completed antibiotic course x5 days.Overall doing well.  Acute metabolic encephalopathy TBI W/ Cognitive impairment ?Dementia at baseline Anxiety and depression With intermittent confusion, and recent behavior changesx 2 wks,wandering through her neighbor's house and that her daughter is trying to kill her.  Unsure if patient is safe to stay at home with progression of her cognitive impairment> mental status worsened by UTI. Vit D2 32 b12 309 TSH 6.5-but normal  free T4.  Completed antibiotic course for UTI.  Currently at baseline alert awake pleasant Follows commands appropriately no agitation Awaiting for placement. Continue her home Lexapro , Depakote , BuSpar  and as needed Xanax .Depakote  level low unsure of compliance  will need recheck soon.  CKD 3a Mild hyponatremia: Renal function is stable.  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 Vascular surgery consulted and noted that all these findings are chronic. Vascular insufficiency not the culprit for her left shoulder pain. Vascular surgery following, appreciate, Continue Plavix , rosuvastatin  and aspirin    Chronic left shoulder pain: Recent imaging of the left shoulder 2 months ago showed osteoarthritis of the shoulder with 2 cm loose body in the axillary pouch. Patient recently received injection to the shoulder by orthopedic surgery. Havin shoulder pain worse with lifting of the arm. Continue Tylenol  scheduled, Voltaren  gel, has a follow-up with orthopedic surgery as outpatient  Chronic combined systolic and systolic HF: Volume status stable.  Continue home Lasix  every other day   HTN BP stable on Toprol   25   CAD HLD Lipid panel 2 months ago showed LDL of 40.  Troponin flat 22-21 continue home aspirin  Plavix  Zetia  and statin   Hypothyroidism TSH up check  FT4.?  Compliance. Continue home Synthroid  -recheck TSH 3 Weeks  GERD: Continue home PPI  Obesity: Patient's Body mass index is 32.32 kg/m. : Will benefit with PCP follow-up, weight loss  healthy lifestyle and outpatient sleep evaluation.  DVT prophylaxis: enoxaparin  (LOVENOX ) injection 40 mg Start: 08/16/23 2000 Code Status:   Code Status: Full Code Family Communication: plan of care discussed with patient  Patient status is: Remains hospitalized because of severity of illness. Level of care: Med-Surg   Dispo: The patient is from: Home alone  Anticipated disposition: Awaiting SNF  Objective: Vitals last 24 hrs: Vitals:   08/20/23 0615  08/20/23 1404 08/20/23 2143 08/21/23 0648  BP: (!) 102/56 137/72 134/78 (!) 169/69  Pulse: (!) 59 64 66 67  Resp: 15 18 16 17   Temp: 98.4 F (36.9 C) 97.7 F (36.5 C) 98 F (36.7 C) 98 F (36.7 C)  TempSrc:  Oral Oral Oral  SpO2: 98% 95% 94% 95%  Weight:      Height:       Weight change:   Physical Examination: General exam: alert awake HEENT:Oral mucosa moist, Ear/Nose WNL grossly Respiratory system: Bilaterally clear BS,no use of accessory muscle Cardiovascular system: S1 & S2 +, No JVD. Gastrointestinal system: Abdomen soft,NT,ND, BS+ Nervous System: Alert, awake, moving all extremities,and following commands. Extremities: LE edema neg,distal peripheral pulses palpable and warm.  Skin: No rashes,no icterus. MSK: Normal muscle bulk,tone, power  Medications reviewed:  Scheduled Meds:  acetaminophen   1,000 mg Oral TID   aspirin  EC  81 mg Oral QHS   busPIRone   15 mg Oral BID   cholecalciferol   5,000 Units Oral QHS   clopidogrel   75 mg Oral QHS   diclofenac  Sodium  2 g Topical QID   divalproex   250 mg Oral QHS   dorzolamide   1 drop Both Eyes BID   enoxaparin  (LOVENOX ) injection  40 mg Subcutaneous Q24H   escitalopram   20 mg Oral Daily   ezetimibe   10 mg Oral Daily   furosemide   40 mg Oral QODAY   gabapentin   300 mg Oral Daily   levothyroxine   175 mcg Oral Q0600   metoprolol  succinate  25 mg Oral Daily   pantoprazole   40 mg Oral QAC breakfast   rosuvastatin   40 mg Oral QHS   vitamin B-12  100 mcg Oral Daily   Continuous Infusions:    Diet Order             Diet regular Room service appropriate? Yes; Fluid consistency: Thin  Diet effective now                  Intake/Output Summary (Last 24 hours) at 08/21/2023 1025 Last data filed at 08/21/2023 1000 Gross per 24 hour  Intake 1255 ml  Output 2325 ml  Net -1070 ml   Net IO Since Admission: -1,961 mL [08/21/23 1025]  Wt Readings from Last 3 Encounters:  08/16/23 77.6 kg  06/20/23 77.6 kg  06/16/23 77.6  kg     Unresulted Labs (From admission, onward)    None     Data Reviewed: I have personally reviewed following labs and imaging studies CBC: Recent Labs  Lab 08/16/23 1358 08/16/23 1408 08/17/23 0717  WBC 5.4  --  5.5  NEUTROABS 3.2  --   --   HGB 12.9 13.3 12.8  HCT 38.5 39.0 39.8  MCV 88.1  --  88.8  PLT 152  --  150   Basic Metabolic Panel:  Recent Labs  Lab 08/16/23 1358 08/16/23 1408 08/17/23 0717  NA 137 138 134*  K 4.2 4.3 3.8  CL 103 102 97*  CO2 25  --  27  GLUCOSE 100* 92 101*  BUN 20 18 17   CREATININE 1.08* 1.30* 1.18*  CALCIUM  9.1  --  9.2   GFR: Estimated Creatinine Clearance: 35.2 mL/min (A) (by C-G formula based on SCr of 1.18 mg/dL (H)). Liver Function Tests:  Recent Labs  Lab 08/16/23 1358  AST 19  ALT 28  ALKPHOS 63  BILITOT  1.9*  PROT 6.8  ALBUMIN  4.0   No results for input(s): LIPASE, AMYLASE in the last 168 hours. No results for input(s): AMMONIA in the last 168 hours. Sepsis Labs: Recent Labs  Lab 08/16/23 1729  LATICACIDVEN 0.6   Recent Results (from the past 240 hours)  Urine Culture     Status: Abnormal   Collection Time: 08/16/23  4:13 PM   Specimen: Urine, Clean Catch  Result Value Ref Range Status   Specimen Description   Final    URINE, CLEAN CATCH Performed at [facility], [address] [place], [place] [address]    Special Requests   Final    NONE Reflexed from [id number] Performed at [facility], [address] [place], [place] [address]    Culture >=100,000 COLONIES/mL ESCHERICHIA COLI (A)  Final   Report Status 08/18/2023 FINAL  Final   Organism ID, Bacteria ESCHERICHIA COLI (A)  Final      Susceptibility   Escherichia coli - MIC*    AMPICILLIN >=32 RESISTANT Resistant     CEFAZOLIN  <=4 SENSITIVE Sensitive     CEFEPIME <=0.12 SENSITIVE Sensitive     CEFTRIAXONE  <=0.25 SENSITIVE Sensitive     CIPROFLOXACIN  <=0.25 SENSITIVE Sensitive     GENTAMICIN <=1 SENSITIVE Sensitive      IMIPENEM <=0.25 SENSITIVE Sensitive     NITROFURANTOIN <=16 SENSITIVE Sensitive     TRIMETH /SULFA  <=20 SENSITIVE Sensitive     AMPICILLIN/SULBACTAM >=32 RESISTANT Resistant     PIP/TAZO <=4 SENSITIVE Sensitive ug/mL    * >=100,000 COLONIES/mL ESCHERICHIA COLI    Antimicrobials/Microbiology: Anti-infectives (From admission, onward)    Start     Dose/Rate Route Frequency Ordered Stop   08/17/23 1730  cefTRIAXone  (ROCEPHIN ) 1 g in sodium chloride  0.9 % 100 mL IVPB        1 g 200 mL/hr over 30 Minutes Intravenous Every 24 hours 08/16/23 2334 08/20/23 1728   08/16/23 1730  cefTRIAXone  (ROCEPHIN ) 2 g in sodium chloride  0.9 % 100 mL IVPB        2 g 200 mL/hr over 30 Minutes Intravenous  Once 08/16/23 1718 08/16/23 1804         Component Value Date/Time   SDES  08/16/2023 1613    URINE, CLEAN CATCH Performed at [facility], [address] [place], [place] [address]    SPECREQUEST  08/16/2023 1613    NONE Reflexed from [id number] Performed at [facility], [address], [place], [place] [address]    CULT >=100,000 COLONIES/mL ESCHERICHIA COLI (A) 08/16/2023 1613   REPTSTATUS 08/18/2023 FINAL 08/16/2023 1613     Radiology Studies: No results found.    LOS: 4 days   Total time spent in review of labs and imaging, patient evaluation, formulation of plan, documentation and communication with family: 25 minutes  Mennie LAMY, MD [facility]  08/21/2023, 10:25 AM

## 2023-08-21 NOTE — TOC Progression Note (Signed)
 Transition of Care [facility]) - Progression Note    Patient Details  Name: Deanna [name] MRN: [id number] Date of Birth: 1941/05/22  [facility]) CM/SW Contact  NORMAN ASPEN, LCSW Phone Number: 08/21/2023, 2:42 PM  Clinical Narrative:     Have left VM with pt's daughter to inform of one SNF bed offer received from [facility].  Need to confirm if she wants to accept so I can begin insurance authorization.  Expected Discharge Plan: Skilled Nursing Facility Barriers to Discharge: Continued Medical Work up, SNF Pending bed offer, English As A Second Language Teacher  Expected Discharge Plan and Services In-house Referral: Clinical Social Work   Post Acute Care Choice: Skilled Nursing Facility Living arrangements for the past 2 months: Single Family Home                                       Social Determinants of Health (SDOH) Interventions SDOH Screenings   Food Insecurity: No Food Insecurity (06/17/2022)  Housing: Low Risk  (06/17/2022)  Transportation Needs: No Transportation Needs (04/24/2023)   Received from [facility]  Utilities: Not At Risk (06/17/2022)  Depression (PHQ2-9): Low Risk  (05/16/2022)  Tobacco Use: Medium Risk (08/16/2023)    Readmission Risk Interventions    08/18/2023    2:28 PM  Readmission Risk Prevention Plan  Post Dischage Appt Complete  Medication Screening Complete  Transportation Screening Complete

## 2023-08-22 DIAGNOSIS — N3000 Acute cystitis without hematuria: Secondary | ICD-10-CM | POA: Diagnosis not present

## 2023-08-22 NOTE — TOC Progression Note (Signed)
 Transition of Care [facility]) - Progression Note    Patient Details  Name: Deanna [name] MRN: [id number] Date of Birth: 1941/05/10  Transition of Care [facility]) CM/SW Contact  NORMAN ASPEN, LCSW Phone Number: 08/22/2023, 1:12 PM  Clinical Narrative:     Have spoken with pt's daughter who has accepted SNF bed offer from [facility] and have secured insurance authorization.  Facility aware and can admit patient tomorrow.  Daughter plans to provide the dc transportation to the facility.  MD/ RN aware.  Expected Discharge Plan: Skilled Nursing Facility Barriers to Discharge: Continued Medical Work up, SNF Pending bed offer, English As A Second Language Teacher  Expected Discharge Plan and Services In-house Referral: Clinical Social Work   Post Acute Care Choice: Skilled Nursing Facility Living arrangements for the past 2 months: Single Family Home                                       Social Determinants of Health (SDOH) Interventions SDOH Screenings   Food Insecurity: No Food Insecurity (06/17/2022)  Housing: Low Risk  (06/17/2022)  Transportation Needs: No Transportation Needs (04/24/2023)   Received from [facility]  Utilities: Not At Risk (06/17/2022)  Depression (PHQ2-9): Low Risk  (05/16/2022)  Tobacco Use: Medium Risk (08/16/2023)    Readmission Risk Interventions    08/18/2023    2:28 PM  Readmission Risk Prevention Plan  Post Dischage Appt Complete  Medication Screening Complete  Transportation Screening Complete

## 2023-08-22 NOTE — Progress Notes (Signed)
 PROGRESS NOTE Deanna [name]  FMW:[id number] DOB: 1941/05/31 DOA: 08/16/2023 PCP: Caro [name] POUR, NP  Brief Narrative/Hospital Course: 89F w/ hx of TBI c/b mild cognitive deficit/dementia brought in by daughter due to recent behavior and mental status change-over past 2 weeks patient has been very demanding and sometimes calling hide up to 20 times a day and has been more agitated over the past 2 days PTA. In ED: Vitals stable afebrile labs,normal CBC, sodium 137, K+ 4.2, glucose 100, creatinine 1.08, troponin 22-21, lactic acid 0.6, UA shows negative nitrite, moderate leuks, WBC 21-50 but no bacteria > diagnosed with UTI with acute metabolic encephalopathy and admitted for further management. Urine culture growing E. coli sensitive to cefazolin  and ceftriaxone , antibiotic changed to p.o. regimen.  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 coli UTI/acute cystitis POA: Patient completed antibiotic course x5 days.Overall doing well.  Acute metabolic encephalopathy TBI W/ Cognitive impairment ?Dementia at baseline Anxiety and depression With intermittent confusion, and recent behavior changesx 2 wks,wandering through her neighbor's house and that her daughter is trying to kill her.  Unsure if patient is safe to stay at home with progression of her cognitive impairment> mental status worsened by UTI. Vit D2 32 b12 309 TSH 6.5-but normal  free T4.  Completed antibiotic course for UTI.  Currently at baseline alert awake pleasant Follows commands appropriately no agitation Awaiting for placement. Continue her home Lexapro , Depakote , BuSpar  and as needed Xanax .Depakote  level low unsure of compliance will need  recheck soon.  CKD 3a Mild hyponatremia: Renal function is stable.  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 Vascular surgery consulted and noted that all these findings are chronic. Vascular insufficiency not the culprit for her left shoulder pain. Vascular surgery following, appreciate, Continue Plavix , rosuvastatin  and aspirin    Chronic left shoulder pain: Recent imaging of the left shoulder 2 months ago showed osteoarthritis of the shoulder with 2 cm loose body in the axillary pouch. Patient recently received injection to the shoulder by orthopedic surgery. Havin shoulder pain worse with lifting of the arm. Continue Tylenol  scheduled, Voltaren  gel, has a follow-up with orthopedic surgery as outpatient  Chronic combined systolic and systolic HF: Volume status stable.  Continue home Lasix  every other day   HTN BP stable on Toprol   25   CAD HLD Lipid panel 2 months ago showed LDL of 40.  Troponin flat 22-21 continue home aspirin  Plavix  Zetia  and statin   Hypothyroidism TSH up check  FT4.?  Compliance. Continue home Synthroid  -recheck TSH 3 Weeks  GERD: Continue home PPI  Obesity: Patient's Body mass index is 32.32 kg/m. : Will benefit with PCP follow-up, weight loss  healthy lifestyle and outpatient sleep evaluation.  DVT prophylaxis: enoxaparin  (LOVENOX ) injection 40 mg Start: 08/16/23 2000 Code Status:   Code Status: Full Code Family Communication: plan of care discussed with patient  Patient status is: Remains hospitalized because of severity of illness. Level of care: Med-Surg   Dispo: The patient is from: Home alone  Anticipated disposition: Awaiting SNF  Objective: Vitals last 24 hrs: Vitals:   08/21/23 0648 08/21/23  1442 08/21/23 2117 08/22/23 0625  BP: (!) 169/69 (!) 144/71 (!) 149/61 (!) 151/66  Pulse: 67 81 75 64  Resp: 17 18 18 18   Temp: 98 F (36.7 C) 98.9 F (37.2 C) 97.6 F (36.4 C) (!) 97.3 F (36.3 C)  TempSrc: Oral Oral Oral Oral  SpO2: 95% 100% 92% 95%  Weight:      Height:       Weight change:   Physical Examination: General exam: alert awake, oriented to self place  HEENT:Oral mucosa moist, Ear/Nose WNL grossly Respiratory system: Bilaterally clear BS,no use of accessory muscle Cardiovascular system: S1 & S2 +, No JVD. Gastrointestinal system: Abdomen soft,NT,ND, BS+ Nervous System: Alert, awake, moving all extremities,and following commands. Extremities: LE edema neg,distal peripheral pulses palpable and warm.  Skin: No rashes,no icterus. MSK: Normal muscle bulk,tone, power   Medications reviewed:  Scheduled Meds:  acetaminophen   1,000 mg Oral TID   aspirin  EC  81 mg Oral QHS   busPIRone   15 mg Oral BID   cholecalciferol   5,000 Units Oral QHS   clopidogrel   75 mg Oral QHS   diclofenac  Sodium  2 g Topical QID   divalproex   250 mg Oral QHS   dorzolamide   1 drop Both Eyes BID   enoxaparin  (LOVENOX ) injection  40 mg Subcutaneous Q24H   escitalopram   20 mg Oral Daily   ezetimibe   10 mg Oral Daily   furosemide   40 mg Oral QODAY   gabapentin   300 mg Oral Daily   levothyroxine   175 mcg Oral Q0600   metoprolol  succinate  25 mg Oral Daily   pantoprazole   40 mg Oral QAC breakfast   rosuvastatin   40 mg Oral QHS   vitamin B-12  100 mcg Oral Daily   Continuous Infusions:    Diet Order             Diet regular Room service appropriate? Yes; Fluid consistency: Thin  Diet effective now                  Intake/Output Summary (Last 24 hours) at 08/22/2023 1121 Last data filed at 08/22/2023 1000 Gross per 24 hour  Intake 1640 ml  Output 2900 ml  Net -1260 ml   Net IO Since Admission: -2,981 mL [08/22/23 1121]  Wt Readings from Last 3 Encounters:  08/16/23 77.6 kg   06/20/23 77.6 kg  06/16/23 77.6 kg     Unresulted Labs (From admission, onward)    None     Data Reviewed: I have personally reviewed following labs and imaging studies CBC: Recent Labs  Lab 08/16/23 1358 08/16/23 1408 08/17/23 0717  WBC 5.4  --  5.5  NEUTROABS 3.2  --   --   HGB 12.9 13.3 12.8  HCT 38.5 39.0 39.8  MCV 88.1  --  88.8  PLT 152  --  150   Basic Metabolic Panel:  Recent Labs  Lab 08/16/23 1358 08/16/23 1408 08/17/23 0717  NA 137 138 134*  K 4.2 4.3 3.8  CL 103 102 97*  CO2 25  --  27  GLUCOSE 100* 92 101*  BUN 20 18 17   CREATININE 1.08* 1.30* 1.18*  CALCIUM  9.1  --  9.2   GFR: Estimated Creatinine Clearance: 35.2 mL/min (A) (by C-G formula based on SCr of 1.18 mg/dL (H)). Liver Function Tests:  Recent Labs  Lab 08/16/23 1358  AST 19  ALT 28  ALKPHOS 63  BILITOT 1.9*  PROT 6.8  ALBUMIN  4.0   No results for input(s): LIPASE, AMYLASE in the last 168 hours. No results for input(s): AMMONIA in the last 168 hours. Sepsis Labs: Recent Labs  Lab 08/16/23 1729  LATICACIDVEN 0.6   Recent Results (from the past 240 hours)  Urine Culture     Status: Abnormal   Collection Time: 08/16/23  4:13 PM   Specimen: Urine, Clean Catch  Result Value Ref Range Status   Specimen Description   Final    URINE, CLEAN CATCH Performed at [facility], [address], [place], [place] [address]    Special Requests   Final    NONE Reflexed from [id number] Performed at [facility], [address], [place], [place] [address]    Culture >=100,000 COLONIES/mL ESCHERICHIA COLI (A)  Final   Report Status 08/18/2023 FINAL  Final   Organism ID, Bacteria ESCHERICHIA COLI (A)  Final      Susceptibility   Escherichia coli - MIC*    AMPICILLIN >=32 RESISTANT Resistant     CEFAZOLIN  <=4 SENSITIVE Sensitive     CEFEPIME <=0.12 SENSITIVE Sensitive     CEFTRIAXONE  <=0.25 SENSITIVE Sensitive     CIPROFLOXACIN  <=0.25 SENSITIVE Sensitive      GENTAMICIN <=1 SENSITIVE Sensitive     IMIPENEM <=0.25 SENSITIVE Sensitive     NITROFURANTOIN <=16 SENSITIVE Sensitive     TRIMETH /SULFA  <=20 SENSITIVE Sensitive     AMPICILLIN/SULBACTAM >=32 RESISTANT Resistant     PIP/TAZO <=4 SENSITIVE Sensitive ug/mL    * >=100,000 COLONIES/mL ESCHERICHIA COLI    Antimicrobials/Microbiology: Anti-infectives (From admission, onward)    Start     Dose/Rate Route Frequency Ordered Stop   08/17/23 1730  cefTRIAXone  (ROCEPHIN ) 1 g in sodium chloride  0.9 % 100 mL IVPB        1 g 200 mL/hr over 30 Minutes Intravenous Every 24 hours 08/16/23 2334 08/20/23 1728   08/16/23 1730  cefTRIAXone  (ROCEPHIN ) 2 g in sodium chloride  0.9 % 100 mL IVPB        2 g 200 mL/hr over 30 Minutes Intravenous  Once 08/16/23 1718 08/16/23 1804         Component Value Date/Time   SDES  08/16/2023 1613    URINE, CLEAN CATCH Performed at [facility], [address] [place], [place] [address]    SPECREQUEST  08/16/2023 1613    NONE Reflexed from [id number] Performed at [facility], [address], [place], [place] [address]    CULT >=100,000 COLONIES/mL ESCHERICHIA COLI (A) 08/16/2023 1613   REPTSTATUS 08/18/2023 FINAL 08/16/2023 1613     Radiology Studies: No results found.    LOS: 5 days   Total time spent in review of labs and imaging, patient evaluation, formulation of plan, documentation and communication with family: 25 minutes  Mennie LAMY, MD [facility]  08/22/2023, 11:21 AM

## 2023-08-22 NOTE — Progress Notes (Signed)
 Physical Therapy Treatment Patient Details Name: Deanna [name] MRN: [id number] DOB: May 07, 1941 Today's Date: 08/22/2023   History of Present Illness Pt is a 83 y.o. female admitted with behavior and mental status changes, found to have UTI. Daughter states pt with intermittent confusion, wandering to neighbors and making accusations that daughter is trying to kill her. PMH significant of TBI c/b mild cognitive deficit, chronic combined systolic and diastolic HF, anxiety and depression, GERD, HTN, HLD, hypothyroidism OSA on CPAP, chronic left shoulder pain, PAD, CAD, CKD 3B, carotid artery stenosis and celiac artery stenosis.    PT Comments  Pt ambulated 14' x 2 with RW and min assist to navigate obstacles, ongoing verbal cues for safe positioning in RW required as pt pushes RW an unsafe distance forward. Ambulation distance limited by fatigue. Pt requires assist for clean up following large BM.    If plan is discharge home, recommend the following: A little help with walking and/or transfers;A little help with bathing/dressing/bathroom;Assistance with cooking/housework;Assist for transportation;Help with stairs or ramp for entrance   Can travel by private vehicle     Yes  Equipment Recommendations  None recommended by PT    Recommendations for Other Services       Precautions / Restrictions Precautions Precautions: Fall Restrictions Weight Bearing Restrictions Per Provider Order: No     Mobility  Bed Mobility Overal bed mobility: Needs Assistance Bed Mobility: Supine to Sit     Supine to sit: Min assist, HOB elevated     General bed mobility comments: assist to raise trunk and cues to scoot hips forward    Transfers Overall transfer level: Needs assistance Equipment used: Rolling walker (2 wheels) Transfers: Sit to/from Stand Sit to Stand: Min assist           General transfer comment: min A for mild unsteadiness with STS transfers, no overt LOB, VCs hand  placement    Ambulation/Gait Ambulation/Gait assistance: Min assist Gait Distance (Feet): 14 Feet Assistive device: Rolling walker (2 wheels) Gait Pattern/deviations: Step-through pattern, Decreased stride length, Trunk flexed Gait velocity: decr     General Gait Details: 85' to bathroom, then 14' to recliner, ongoing verbal cues for positioning in RW as pt tends to walk too far behind RW, min At to navigate obstacles with RW; assist for wiping after pt had BM   Stairs             Wheelchair Mobility     Tilt Bed    Modified Rankin (Stroke Patients Only)       Balance Overall balance assessment: Needs assistance Sitting-balance support: Feet supported Sitting balance-Leahy Scale: Good Sitting balance - Comments: steady reaching outside BOS   Standing balance support: Bilateral upper extremity supported, During functional activity, Reliant on assistive device for balance Standing balance-Leahy Scale: Fair Standing balance comment: mild unsteadiness                            Cognition Arousal: Alert Behavior During Therapy: WFL for tasks assessed/performed Overall Cognitive Status: Impaired/Different from baseline Area of Impairment: Orientation                 Orientation Level: Disoriented to, Time             General Comments: pt pleasant        Exercises      General Comments        Pertinent Vitals/Pain Pain Assessment Faces Pain Scale: Hurts  little more Pain Location: L shoulder Pain Descriptors / Indicators: Sore Pain Intervention(s): Limited activity within patient's tolerance, Monitored during session, Repositioned    Home Living                          Prior Function            PT Goals (current goals can now be found in the care plan section) Acute Rehab PT Goals PT Goal Formulation: Patient unable to participate in goal setting Time For Goal Achievement: 08/31/23 Potential to Achieve Goals:  Good Progress towards PT goals: Progressing toward goals    Frequency    Min 1X/week      PT Plan      Co-evaluation              AM-PAC PT 6 Clicks Mobility   Outcome Measure  Help needed turning from your back to your side while in a flat bed without using bedrails?: None Help needed moving from lying on your back to sitting on the side of a flat bed without using bedrails?: A Little Help needed moving to and from a bed to a chair (including a wheelchair)?: A Little Help needed standing up from a chair using your arms (e.g., wheelchair or bedside chair)?: A Little Help needed to walk in hospital room?: A Little Help needed climbing 3-5 steps with a railing? : A Lot 6 Click Score: 18    End of Session Equipment Utilized During Treatment: Gait belt Activity Tolerance: Patient tolerated treatment well Patient left: in chair;with chair alarm set;with call bell/phone within reach Nurse Communication: Mobility status PT Visit Diagnosis: Difficulty in walking, not elsewhere classified (R26.2);Pain Pain - Right/Left: Left Pain - part of body: Shoulder     Time: 8863-8845 PT Time Calculation (min) (ACUTE ONLY): 18 min  Charges:    $Gait Training: 8-22 mins PT General Charges $$ ACUTE PT VISIT: 1 Visit                     Sylvan Nest Kistler PT 08/22/2023  Acute Rehabilitation Services  Office [phone]

## 2023-08-22 NOTE — Plan of Care (Signed)
 ?  Problem: Clinical Measurements: ?Goal: Will remain free from infection ?Outcome: Progressing ?  ?

## 2023-08-22 NOTE — Plan of Care (Signed)
   Problem: Education: Goal: Knowledge of General Education information will improve Description: Including pain rating scale, medication(s)/side effects and non-pharmacologic comfort measures Outcome: Progressing   Problem: Clinical Measurements: Goal: Ability to maintain clinical measurements within normal limits will improve Outcome: Progressing

## 2023-08-23 DIAGNOSIS — M6281 Muscle weakness (generalized): Secondary | ICD-10-CM | POA: Diagnosis not present

## 2023-08-23 DIAGNOSIS — I739 Peripheral vascular disease, unspecified: Secondary | ICD-10-CM | POA: Diagnosis not present

## 2023-08-23 DIAGNOSIS — F411 Generalized anxiety disorder: Secondary | ICD-10-CM | POA: Diagnosis not present

## 2023-08-23 DIAGNOSIS — S069XAA Unspecified intracranial injury with loss of consciousness status unknown, initial encounter: Secondary | ICD-10-CM | POA: Diagnosis not present

## 2023-08-23 DIAGNOSIS — M25512 Pain in left shoulder: Secondary | ICD-10-CM | POA: Diagnosis not present

## 2023-08-23 DIAGNOSIS — H409 Unspecified glaucoma: Secondary | ICD-10-CM | POA: Diagnosis not present

## 2023-08-23 DIAGNOSIS — Z7189 Other specified counseling: Secondary | ICD-10-CM | POA: Diagnosis not present

## 2023-08-23 DIAGNOSIS — I5042 Chronic combined systolic (congestive) and diastolic (congestive) heart failure: Secondary | ICD-10-CM | POA: Diagnosis not present

## 2023-08-23 DIAGNOSIS — Z7901 Long term (current) use of anticoagulants: Secondary | ICD-10-CM | POA: Diagnosis not present

## 2023-08-23 DIAGNOSIS — G8929 Other chronic pain: Secondary | ICD-10-CM | POA: Diagnosis not present

## 2023-08-23 DIAGNOSIS — E039 Hypothyroidism, unspecified: Secondary | ICD-10-CM | POA: Diagnosis not present

## 2023-08-23 DIAGNOSIS — S069XAS Unspecified intracranial injury with loss of consciousness status unknown, sequela: Secondary | ICD-10-CM | POA: Diagnosis not present

## 2023-08-23 DIAGNOSIS — L89322 Pressure ulcer of left buttock, stage 2: Secondary | ICD-10-CM | POA: Diagnosis not present

## 2023-08-23 DIAGNOSIS — N3000 Acute cystitis without hematuria: Secondary | ICD-10-CM | POA: Diagnosis not present

## 2023-08-23 DIAGNOSIS — G9341 Metabolic encephalopathy: Secondary | ICD-10-CM | POA: Diagnosis not present

## 2023-08-23 DIAGNOSIS — R41841 Cognitive communication deficit: Secondary | ICD-10-CM | POA: Diagnosis not present

## 2023-08-23 DIAGNOSIS — N1832 Chronic kidney disease, stage 3b: Secondary | ICD-10-CM | POA: Diagnosis not present

## 2023-08-23 DIAGNOSIS — R531 Weakness: Secondary | ICD-10-CM | POA: Diagnosis not present

## 2023-08-23 DIAGNOSIS — E569 Vitamin deficiency, unspecified: Secondary | ICD-10-CM | POA: Diagnosis not present

## 2023-08-23 DIAGNOSIS — F0390 Unspecified dementia without behavioral disturbance: Secondary | ICD-10-CM | POA: Diagnosis not present

## 2023-08-23 DIAGNOSIS — F32A Depression, unspecified: Secondary | ICD-10-CM | POA: Diagnosis not present

## 2023-08-23 DIAGNOSIS — E785 Hyperlipidemia, unspecified: Secondary | ICD-10-CM | POA: Diagnosis not present

## 2023-08-23 DIAGNOSIS — N390 Urinary tract infection, site not specified: Secondary | ICD-10-CM | POA: Diagnosis not present

## 2023-08-23 DIAGNOSIS — F419 Anxiety disorder, unspecified: Secondary | ICD-10-CM | POA: Diagnosis not present

## 2023-08-23 DIAGNOSIS — R42 Dizziness and giddiness: Secondary | ICD-10-CM | POA: Diagnosis not present

## 2023-08-23 DIAGNOSIS — I5040 Unspecified combined systolic (congestive) and diastolic (congestive) heart failure: Secondary | ICD-10-CM | POA: Diagnosis not present

## 2023-08-23 DIAGNOSIS — R4182 Altered mental status, unspecified: Secondary | ICD-10-CM | POA: Diagnosis not present

## 2023-08-23 MED ORDER — ALPRAZOLAM 0.5 MG PO TABS: 0.5000 mg | ORAL_TABLET | Freq: Every day | ORAL | 0 refills | Status: DC | PRN

## 2023-08-23 NOTE — TOC Transition Note (Signed)
 Transition of Care [facility]) - Discharge Note   Patient Details  Name: Deanna [name] MRN: [id number] Date of Birth: [birth year]  [facility]) CM/SW Contact:  Delilah Fend, LCSW Phone Number: 08/23/2023, 11:19 AM   Clinical Narrative:    Pt medically cleared for dc today to [facility] and have received insurance authorization.  Pt and daughter aware and agreeable.  Daughter should be arriving to the hospital in ~ one hour and will provide dc transportation to facility.  RN to call report to [phone].  No further TOC needs.   Final next level of care: Skilled Nursing Facility Barriers to Discharge: Barriers Resolved   Patient Goals and CMS Choice Patient states their goals for this hospitalization and ongoing recovery are:: " go home"   Choice offered to / list presented to : Adult Children      Discharge Placement   Existing PASRR number confirmed : 08/18/23          Patient chooses bed at: [facility] Patient to be transferred to facility by: daughter to transport Name of family member notified: daughter    Discharge Plan and Services Additional resources added to the After Visit Summary for   In-house Referral: Clinical Social Work   Post Acute Care Choice: Skilled Nursing Facility          DME Arranged: N/A DME Agency: NA                  Social Drivers of Health (SDOH) Interventions SDOH Screenings   Food Insecurity: No Food Insecurity (06/17/2022)  Housing: Low Risk  (06/17/2022)  Transportation Needs: No Transportation Needs (04/24/2023)   Received from [facility]  Utilities: Not At Risk (06/17/2022)  Depression (PHQ2-9): Low Risk  (05/16/2022)  Tobacco Use: Medium Risk (08/16/2023)     Readmission Risk Interventions    08/18/2023    2:28 PM  Readmission Risk Prevention Plan  Post Dischage Appt Complete  Medication Screening Complete  Transportation Screening Complete

## 2023-08-23 NOTE — Plan of Care (Signed)
   Problem: Activity: Goal: Risk for activity intolerance will decrease Outcome: Progressing   Problem: Coping: Goal: Level of anxiety will decrease Outcome: Progressing

## 2023-08-23 NOTE — Discharge Summary (Signed)
 Physician Discharge Summary  Deanna [name]:[id number] DOB: Dec 04, 1940 DOA: 08/16/2023  PCP: Verma Gobble, NP  Admit date: 08/16/2023 Discharge date: 08/23/2023 Recommendations for Outpatient Follow-up:  Follow up with PCP in 1 weeks-call for appointment Please obtain BMP/CBC in one week  Discharge Dispo: SNF Discharge Condition: Stable Code Status:   Code Status: Full Code Diet recommendation:  Diet Order             Diet regular Room service appropriate? Yes; Fluid consistency: Thin  Diet effective now                    Brief/Interim Summary: 21F w/ hx of TBI c/b mild cognitive deficit/dementia brought in by daughter due to recent behavior and mental status change-over past 2 weeks patient has been very demanding and sometimes calling hide up to 20 times a day and has been more agitated over the past 2 days PTA. In ED: Vitals stable afebrile labs,normal CBC, sodium 137, K+ 4.2, glucose 100, creatinine 1.08, troponin 22-21, lactic acid 0.6, UA shows negative nitrite, moderate leuks, WBC 21-50 but no bacteria > diagnosed with UTI with acute metabolic encephalopathy and admitted for further management. Urine culture growing E. coli sensitive to cefazolin  and ceftriaxone , antibiotic changed to p.o. regimen.  Mental status overall better, remains deconditioned weak and PT OT recommending skilled nursing facility    Discharge Diagnoses:  Principal Problem:   UTI (urinary tract infection) Active Problems:   Left arm pain   Delirium  E. coli UTI/acute cystitis POA: Patient completed antibiotic course x5 days.Overall doing well.   Acute metabolic encephalopathy TBI W/ Cognitive impairment ?Dementia at baseline Anxiety and depression With intermittent confusion, and recent behavior changesx 2 wks,wandering through her neighbor's house and "that her daughter is trying to kill her.  Unsure if patient is safe to stay at home with progression of her cognitive impairment>  mental status worsened by UTI. Vit D2 32 b12 309 TSH 6.5-but normal  free T4.  Completed antibiotic course for UTI.  Currently at baseline alert awake pleasant Follows commands appropriately no agitation Awaiting for placement. Continue her home Lexapro , Depakote , BuSpar  and as needed Xanax .Depakote  level low unsure of compliance will need recheck soon.   CKD 3a Mild hyponatremia: Renal function is stable.  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 Vascular surgery consulted and noted that all these findings are chronic. Vascular insufficiency not the culprit for her left shoulder pain. Vascular surgery following, appreciate, Continue Plavix , rosuvastatin  and aspirin    Chronic left shoulder pain: Recent imaging of the left shoulder 2 months ago showed osteoarthritis of the shoulder with 2 cm loose body in the axillary pouch. Patient recently received injection to the shoulder by orthopedic surgery. Havin shoulder pain worse with lifting of the arm. Continue Tylenol  scheduled, Voltaren  gel, has a follow-up with orthopedic surgery as outpatient   Chronic combined systolic and systolic HF: Volume status stable.  Continue home Lasix  every other day   HTN BP stable on Toprol   25   CAD HLD Lipid panel 2 months ago showed LDL of 40.  Troponin flat 22-21 continue home aspirin  Plavix  Zetia  and statin   Hypothyroidism TSH up check  FT4.?  Compliance. Continue home Synthroid  -recheck TSH 3 Weeks  GERD: Continue home PPI   Obesity: Patient's Body mass index is 32.32 kg/m. : Will benefit with PCP follow-up, weight loss  healthy lifestyle and outpatient sleep evaluation  Consults: none  Subjective: Patient is alert awake pleasantly confused eager to go to  rehab today  Discharge Exam: Vitals:   08/22/23 2018 08/23/23 0506  BP: (!) 105/52 (!) 148/85  Pulse: 70 66  Resp: 18 18  Temp: 98 F (36.7 C) (!) 97.5 F (36.4 C)  SpO2: 96% 95%   General: Pt is alert, awake, not in acute distress Cardiovascular: RRR, S1/S2 +, no rubs, no gallops Respiratory: CTA bilaterally, no wheezing, no rhonchi Abdominal: Soft, NT, ND, bowel sounds + Extremities: no edema, no cyanosis  Discharge Instructions  Discharge Instructions     Discharge instructions   Complete by: As directed    Please call call MD or return to ER for similar or worsening recurring problem that brought you to hospital or if any fever,nausea/vomiting,abdominal pain, uncontrolled pain, chest pain,  shortness of breath or any other alarming symptoms.  Please follow-up your doctor as instructed in a week time and call the office for appointment.  Please avoid alcohol, smoking, or any other illicit substance and maintain healthy habits including taking your regular medications as prescribed.  You were cared for by a hospitalist during your hospital stay. If you have any questions about your discharge medications or the care you received while you were in the hospital after you are discharged, you can call the unit and ask to speak with the hospitalist on call if the hospitalist that took care of you is not available.  Once you are discharged, your primary care physician will handle any further medical issues. Please note that NO REFILLS for any discharge medications will be authorized once you are discharged, as it is imperative that you return to your primary care physician (or establish a relationship with a primary care physician if you do not have one) for your aftercare needs so that they can reassess your need for medications and monitor your lab values   Increase activity slowly   Complete by: As directed       Allergies as of 08/23/2023       Reactions   Lidocaine  Swelling,  Other (See Comments)   Use CORRECT dose for age/weight- red welts and tongue swelling result, if not   Cymbalta  [duloxetine  Hcl] Other (See Comments)   Increased confusion and memory concerns    Donepezil Other (See Comments)   Altered mood and Aggression    Mobic [meloxicam] Other (See Comments)   "Heart failure"- cannot take   Namenda [memantine] Other (See Comments)   Severe aggression   Nsaids Other (See Comments)   Cannot have due to heart issues   Other Other (See Comments)   No salt or anything that might cause fluid retention/swelling!!   Oxycontin  [oxycodone ] Other (See Comments)   "Toxic dementia" - daughter feels like she could take this, however(??)   Vicodin [hydrocodone-acetaminophen ] Other (See Comments)   Delirium, Confusion, and Toxic dementia        Medication List     TAKE these medications    ALPRAZolam  0.5 MG tablet Commonly known as: Xanax  Take 1 tablet (0.5 mg total) by mouth daily as needed for up to 2 doses for anxiety.   aspirin  EC 81 MG tablet Take 81 mg by mouth at bedtime.   busPIRone  15 MG tablet Commonly known as: BUSPAR  Take 1 tablet (15 mg  total) by mouth 2 (two) times daily.   clopidogrel  75 MG tablet Commonly known as: PLAVIX  TAKE 1 TABLET(75 MG) BY MOUTH DAILY What changed: See the new instructions.   divalproex  250 MG 24 hr tablet Commonly known as: DEPAKOTE  ER Take 1 tablet (250 mg total) by mouth at bedtime.   dorzolamide  2 % ophthalmic solution Commonly known as: TRUSOPT  Place 1 drop into both eyes in the morning and at bedtime.   escitalopram  20 MG tablet Commonly known as: LEXAPRO  Take 1 tablet (20 mg total) by mouth daily.   ezetimibe  10 MG tablet Commonly known as: ZETIA  Take 10 mg by mouth daily.   furosemide  40 MG tablet Commonly known as: LASIX  TAKE 1 TABLET(40 MG) BY MOUTH DAILY What changed: See the new instructions.   gabapentin  100 MG capsule Commonly known as: NEURONTIN  TAKE 2 CAPSULES(200 MG) BY  MOUTH DAILY What changed: See the new instructions.   ketoconazole  2 % shampoo Commonly known as: NIZORAL  APPLY TOPICALLY 2 TIMES A WEEK. What changed: See the new instructions.   levothyroxine  175 MCG tablet Commonly known as: SYNTHROID  TAKE 1 TABLET BY MOUTH DAILY AT THE SAME TIME AND SEPARATE FROM ALL OTHER MEDICATIONS What changed: See the new instructions.   meclizine  25 MG tablet Commonly known as: ANTIVERT  Take 1 tablet (25 mg total) by mouth as needed for dizziness. What changed: when to take this   metoprolol  succinate 25 MG 24 hr tablet Commonly known as: TOPROL -XL Take 25 mg by mouth daily.   nitroGLYCERIN  0.4 MG SL tablet Commonly known as: NITROSTAT  Place 1 tablet (0.4 mg total) under the tongue every 5 (five) minutes x 3 doses as needed for chest pain.   pantoprazole  40 MG tablet Commonly known as: PROTONIX  TAKE 1 TABLET EVERY DAY What changed: when to take this   rosuvastatin  40 MG tablet Commonly known as: CRESTOR  TAKE 1 TABLET(40 MG) BY MOUTH DAILY What changed: See the new instructions.   TYLENOL  500 MG tablet Generic drug: acetaminophen  Take 1,000 mg by mouth in the morning.   VITAMIN B-12 PO Take 1 tablet by mouth in the morning.   Vitamin D3 125 MCG (5000 UT) Caps Take 5,000 Units by mouth at bedtime.        Follow-up Information     Verma Gobble, NP Follow up in 1 week(s).   Specialty: Geriatric Medicine Contact information: [address] [place] [address] [phone]                Allergies  Allergen Reactions   Lidocaine  Swelling and Other (See Comments)    Use CORRECT dose for age/weight- red welts and tongue swelling result, if not   Cymbalta  [Duloxetine  Hcl] Other (See Comments)    Increased confusion and memory concerns    Donepezil Other (See Comments)    Altered mood and Aggression     Mobic [Meloxicam] Other (See Comments)    "Heart failure"- cannot take   Namenda [Memantine] Other (See  Comments)    Severe aggression   Nsaids Other (See Comments)    Cannot have due to heart issues   Other Other (See Comments)    No salt or anything that might cause fluid retention/swelling!!   Oxycontin  [Oxycodone ] Other (See Comments)    "Toxic dementia" - daughter feels like she could take this, however(??)   Vicodin [Hydrocodone-Acetaminophen ] Other (See Comments)    Delirium, Confusion, and Toxic dementia    The results of significant diagnostics from this  hospitalization (including imaging, microbiology, ancillary and laboratory) are listed below for reference.    Microbiology: Recent Results (from the past 240 hours)  Urine Culture     Status: Abnormal   Collection Time: 08/16/23  4:13 PM   Specimen: Urine, Clean Catch  Result Value Ref Range Status   Specimen Description   Final    URINE, CLEAN CATCH Performed at [facility], [address] [place], [place] [address]    Special Requests   Final    NONE Reflexed from [phone] [id number] Performed at [facility], [address], [place], [place] [address]    Culture >=100,000 COLONIES/mL ESCHERICHIA COLI (A)  Final   Report Status 08/18/2023 FINAL  Final   Organism ID, Bacteria ESCHERICHIA COLI (A)  Final      Susceptibility   Escherichia coli - MIC*    AMPICILLIN >=32 RESISTANT Resistant     CEFAZOLIN  <=4 SENSITIVE Sensitive     CEFEPIME <=0.12 SENSITIVE Sensitive     CEFTRIAXONE  <=0.25 SENSITIVE Sensitive     CIPROFLOXACIN  <=0.25 SENSITIVE Sensitive     GENTAMICIN <=1 SENSITIVE Sensitive     IMIPENEM <=0.25 SENSITIVE Sensitive     NITROFURANTOIN <=16 SENSITIVE Sensitive     TRIMETH /SULFA  <=20 SENSITIVE Sensitive     AMPICILLIN/SULBACTAM >=32 RESISTANT Resistant     PIP/TAZO <=4 SENSITIVE Sensitive ug/mL    * >=100,000 COLONIES/mL ESCHERICHIA COLI    Procedures/Studies: CT ANGIO UP EXTREM LEFT W &/OR WO CONTAST Result Date: 08/16/2023 CLINICAL DATA:  Pain.  Vascular murmur. EXAM: CT ANGIOGRAPHY  UPPER LEFT EXTREMITY TECHNIQUE: CT angiogram imaging of the left upper extremity was performed after the administration of intravenous contrast. Coronal and sagittal reformats were submitted. MIP reformats were submitted as well. CONTRAST:  OMNIPAQUE  IOHEXOL  350 MG/ML SOLN COMPARISON:  None Available. FINDINGS: Arterial: There severe stenosis/occlusion of the proximal 3.5 cm of the left subclavian artery secondary to calcified atherosclerotic disease. The distal subclavian, axillary, brachial, ulnar, and radial arteries appear patent to the level of the wrist. There is no dissection or aneurysm. There is also severe stenosis of the origin of the left common carotid artery near the arch of the aorta. There also severe atherosclerotic calcifications of the distal left common carotid artery. Note is made of the accessory arterial branch extending from the patent portion of the left common carotid artery to the mid left subclavian artery. There is severe focal stenosis of the celiac artery, but visualized branches are otherwise within normal limits. Osseous: Degenerative changes are seen in the glenohumeral joint with joint space narrowing and osteophyte formation. No acute fracture or focal osseous lesion identified. Soft tissues: No evidence for soft tissue infection, hematoma or gas. There soft tissue calcifications along the posteroinferior margin of the glenoid. There severe degenerative changes of the spine. Others: Subcentimeter renal cysts are present on the left. Colonic diverticula are present. Review of the MIP images confirms the above findings. IMPRESSION: 1. 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 2. 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 3. Severe focal stenosis of the celiac artery. 4. Colonic diverticulosis. Electronically Signed   By: Tyron Gallon M.D.   On: 08/16/2023 16:36   CT Head Wo  Contrast Result Date: 08/16/2023 CLINICAL DATA:  Delirium. Asymmetric upper extremity pulses, less prominent on the left. Left arm and shoulder pain. EXAM: CT HEAD WITHOUT CONTRAST TECHNIQUE: Contiguous axial images were obtained from  the base of the skull through the vertex without intravenous contrast. RADIATION DOSE REDUCTION: This exam was performed according to the departmental dose-optimization program which includes automated exposure control, adjustment of the mA and/or kV according to patient size and/or use of iterative reconstruction technique. COMPARISON:  MR head without contrast 11/16/2016 FINDINGS: Brain: Mild atrophy and moderate periventricular white matter hypoattenuation demonstrates continued progression. Deep brain nuclei are within normal limits. No acute or focal cortical infarct is present. No acute hemorrhage or mass lesion is present. The ventricles are proportionate to the degree of atrophy. No significant extraaxial fluid collection is present. The brainstem and cerebellum are within normal limits. Midline structures are within normal limits. Vascular: Minimal calcifications are present within the cavernous internal carotid arteries. No hyperdense vessel is present. Skull: Calvarium is intact. No focal lytic or blastic lesions are present. No significant extracranial soft tissue lesion is present. Sinuses/Orbits: The paranasal sinuses and mastoid air cells are clear. Bilateral lens replacements are noted. Globes and orbits are otherwise unremarkable. IMPRESSION: 1. No acute intracranial abnormality or significant interval change. 2. Mild atrophy and moderate periventricular white matter disease demonstrates continued progression. This likely reflects the sequela of chronic microvascular ischemia. Electronically Signed   By: Audree Leas M.D.   On: 08/16/2023 15:19    Labs: BNP (last 3 results) No results for input(s): "BNP" in the last 8760 hours. Basic Metabolic  Panel: Recent Labs  Lab 08/16/23 1358 08/16/23 1408 08/17/23 0717  NA 137 138 134*  K 4.2 4.3 3.8  CL 103 102 97*  CO2 25  --  27  GLUCOSE 100* 92 101*  BUN 20 18 17   CREATININE 1.08* 1.30* 1.18*  CALCIUM  9.1  --  9.2   Liver Function Tests: Recent Labs  Lab 08/16/23 1358  AST 19  ALT 28  ALKPHOS 63  BILITOT 1.9*  PROT 6.8  ALBUMIN  4.0   No results for input(s): "LIPASE", "AMYLASE" in the last 168 hours. No results for input(s): "AMMONIA" in the last 168 hours. CBC: Recent Labs  Lab 08/16/23 1358 08/16/23 1408 08/17/23 0717  WBC 5.4  --  5.5  NEUTROABS 3.2  --   --   HGB 12.9 13.3 12.8  HCT 38.5 39.0 39.8  MCV 88.1  --  88.8  PLT 152  --  150   Cardiac Enzymes: No results for input(s): "CKTOTAL", "CKMB", "CKMBINDEX", "TROPONINI" in the last 168 hours. BNP: Invalid input(s): "POCBNP" CBG: No results for input(s): "GLUCAP" in the last 168 hours. D-Dimer No results for input(s): "DDIMER" in the last 72 hours. Hgb A1c No results for input(s): "HGBA1C" in the last 72 hours. Lipid Profile No results for input(s): "CHOL", "HDL", "LDLCALC", "TRIG", "CHOLHDL", "LDLDIRECT" in the last 72 hours. Thyroid  function studies No results for input(s): "TSH", "T4TOTAL", "T3FREE", "THYROIDAB" in the last 72 hours.  Invalid input(s): "FREET3" Anemia work up No results for input(s): "VITAMINB12", "FOLATE", "FERRITIN", "TIBC", "IRON", "RETICCTPCT" in the last 72 hours. Urinalysis    Component Value Date/Time   COLORURINE STRAW (A) 08/16/2023 1613   APPEARANCEUR CLEAR 08/16/2023 1613   LABSPEC 1.032 (H) 08/16/2023 1613   PHURINE 7.0 08/16/2023 1613   GLUCOSEU NEGATIVE 08/16/2023 1613   HGBUR NEGATIVE 08/16/2023 1613   BILIRUBINUR NEGATIVE 08/16/2023 1613   KETONESUR NEGATIVE 08/16/2023 1613   PROTEINUR NEGATIVE 08/16/2023 1613   UROBILINOGEN 0.2 04/26/2015 0811   NITRITE NEGATIVE 08/16/2023 1613   LEUKOCYTESUR MODERATE (A) 08/16/2023 1613   Sepsis Labs Recent Labs   Lab  08/16/23 1358 08/17/23 0717  WBC 5.4 5.5   Microbiology Recent Results (from the past 240 hours)  Urine Culture     Status: Abnormal   Collection Time: 08/16/23  4:13 PM   Specimen: Urine, Clean Catch  Result Value Ref Range Status   Specimen Description   Final    URINE, CLEAN CATCH Performed at [facility], [address], [place], [place] [address]    Special Requests   Final    NONE Reflexed from [phone] [phone] Performed at [facility], [address] [place], [place] [address]    Culture >=100,000 COLONIES/mL ESCHERICHIA COLI (A)  Final   Report Status 08/18/2023 FINAL  Final   Organism ID, Bacteria ESCHERICHIA COLI (A)  Final      Susceptibility   Escherichia coli - MIC*    AMPICILLIN >=32 RESISTANT Resistant     CEFAZOLIN  <=4 SENSITIVE Sensitive     CEFEPIME <=0.12 SENSITIVE Sensitive     CEFTRIAXONE  <=0.25 SENSITIVE Sensitive     CIPROFLOXACIN  <=0.25 SENSITIVE Sensitive     GENTAMICIN <=1 SENSITIVE Sensitive     IMIPENEM <=0.25 SENSITIVE Sensitive     NITROFURANTOIN <=16 SENSITIVE Sensitive     TRIMETH /SULFA  <=20 SENSITIVE Sensitive     AMPICILLIN/SULBACTAM >=32 RESISTANT Resistant     PIP/TAZO <=4 SENSITIVE Sensitive ug/mL    * >=100,000 COLONIES/mL ESCHERICHIA COLI    Time coordinating discharge: 25 minutes  SIGNED: Lesa Rape, MD  [facility] 08/23/2023, 9:27 AM  If 7PM-7AM, please contact night-coverage [email or web address]

## 2023-08-23 NOTE — Progress Notes (Signed)
 Occupational Therapy Treatment Patient Details Name: Deanna [name] MRN: [id number] DOB: 16-May-1941 Today's Date: 08/23/2023   History of present illness Pt is a 83 y.o. female admitted with behavior and mental status changes, found to have UTI. Daughter states pt with intermittent confusion, wandering to neighbors and making accusations that daughter is trying to kill her. PMH significant of TBI c/b mild cognitive deficit, chronic combined systolic and diastolic HF, anxiety and depression, GERD, HTN, HLD, hypothyroidism OSA on CPAP, chronic left shoulder pain, PAD, CAD, CKD 3B, carotid artery stenosis and celiac artery stenosis.   OT comments  Pt making progress with functional goals. Pt reports that she feels better and is eager to go to rehab. Pt requires verbal cues for safety during ADL mobility using RW and during sit- stand/standing ADL tasks.       If plan is discharge home, recommend the following:  A little help with walking and/or transfers;A little help with bathing/dressing/bathroom;Assistance with cooking/housework;Direct supervision/assist for medications management;Direct supervision/assist for financial management;Assist for transportation;Supervision due to cognitive status   Equipment Recommendations  None recommended by OT    Recommendations for Other Services      Precautions / Restrictions Precautions Precautions: Fall Restrictions Weight Bearing Restrictions Per Provider Order: No       Mobility Bed Mobility Overal bed mobility: Needs Assistance Bed Mobility: Supine to Sit     Supine to sit: Contact guard     General bed mobility comments: assist to raise trunk and cues to scoot hips forward    Transfers Overall transfer level: Needs assistance Equipment used: Rolling walker (2 wheels) Transfers: Sit to/from Stand Sit to Stand: Min assist     Step pivot transfers: Min assist           Balance Overall balance assessment: Needs  assistance Sitting-balance support: Feet supported Sitting balance-Leahy Scale: Good Sitting balance - Comments: steady reaching outside BOS   Standing balance support: Bilateral upper extremity supported, During functional activity, Reliant on assistive device for balance Standing balance-Leahy Scale: Fair                             ADL either performed or assessed with clinical judgement   ADL Overall ADL's : Needs assistance/impaired     Grooming: Wash/dry face;Wash/dry hands;Oral care;Contact guard assist;Standing           Upper Body Dressing : Contact guard assist;Sitting   Lower Body Dressing: Minimal assistance;Sit to/from stand Lower Body Dressing Details (indicate cue type and reason): underwear, socks Toilet Transfer: Minimal assistance;Rolling walker (2 wheels);Regular Toilet;Grab bars;Cueing for safety   Toileting- Clothing Manipulation and Hygiene: Contact guard assist;Sit to/from stand       Functional mobility during ADLs: Minimal assistance;Rolling walker (2 wheels)      Extremity/Trunk Assessment Upper Extremity Assessment Upper Extremity Assessment: Generalized weakness            Vision Baseline Vision/History: 1 Wears glasses Ability to See in Adequate Light: 0 Adequate Patient Visual Report: No change from baseline     Perception     Praxis      Cognition Arousal: Alert Behavior During Therapy: WFL for tasks assessed/performed Overall Cognitive Status: Impaired/Different from baseline Area of Impairment: Memory, Safety/judgement, Awareness                               General Comments: pt pleasant  Exercises      Shoulder Instructions       General Comments      Pertinent Vitals/ Pain       Pain Assessment Pain Assessment: No/denies pain Faces Pain Scale: No hurt Pain Intervention(s): Monitored during session  Home Living                                           Prior Functioning/Environment              Frequency  Min 1X/week        Progress Toward Goals  OT Goals(current goals can now be found in the care plan section)  Progress towards OT goals: Progressing toward goals     Plan      Co-evaluation                 AM-PAC OT "6 Clicks" Daily Activity     Outcome Measure   Help from another person eating meals?: None Help from another person taking care of personal grooming?: A Little Help from another person toileting, which includes using toliet, bedpan, or urinal?: A Little Help from another person bathing (including washing, rinsing, drying)?: A Little Help from another person to put on and taking off regular upper body clothing?: A Little Help from another person to put on and taking off regular lower body clothing?: A Little 6 Click Score: 19    End of Session Equipment Utilized During Treatment: Rolling walker (2 wheels);Gait belt  OT Visit Diagnosis: Unsteadiness on feet (R26.81);Other symptoms and signs involving cognitive function   Activity Tolerance Patient tolerated treatment well   Patient Left in chair;with call bell/phone within reach;with chair alarm set   Nurse Communication          Time: 1041-1105 OT Time Calculation (min): 24 min  Charges: OT General Charges $OT Visit: 1 Visit OT Treatments $Self Care/Home Management : 8-22 mins $Therapeutic Activity: 8-22 mins   Alfred Ann 08/23/2023, 1:08 PM

## 2023-08-23 NOTE — Progress Notes (Signed)
 Nurse called report to [place] at [facility].  Awaiting for pt's daughter to pick her up and transport her to facility.  AVS is included in discharge packet for SNF.

## 2023-08-24 DIAGNOSIS — L89322 Pressure ulcer of left buttock, stage 2: Secondary | ICD-10-CM | POA: Diagnosis not present

## 2023-08-24 DIAGNOSIS — F419 Anxiety disorder, unspecified: Secondary | ICD-10-CM | POA: Diagnosis not present

## 2023-08-24 DIAGNOSIS — F0390 Unspecified dementia without behavioral disturbance: Secondary | ICD-10-CM | POA: Diagnosis not present

## 2023-08-24 DIAGNOSIS — G8929 Other chronic pain: Secondary | ICD-10-CM | POA: Diagnosis not present

## 2023-08-24 DIAGNOSIS — R531 Weakness: Secondary | ICD-10-CM | POA: Diagnosis not present

## 2023-08-24 DIAGNOSIS — I5040 Unspecified combined systolic (congestive) and diastolic (congestive) heart failure: Secondary | ICD-10-CM | POA: Diagnosis not present

## 2023-08-24 DIAGNOSIS — I739 Peripheral vascular disease, unspecified: Secondary | ICD-10-CM | POA: Diagnosis not present

## 2023-08-24 DIAGNOSIS — E569 Vitamin deficiency, unspecified: Secondary | ICD-10-CM | POA: Diagnosis not present

## 2023-08-24 DIAGNOSIS — E039 Hypothyroidism, unspecified: Secondary | ICD-10-CM | POA: Diagnosis not present

## 2023-08-24 DIAGNOSIS — Z7189 Other specified counseling: Secondary | ICD-10-CM | POA: Diagnosis not present

## 2023-08-24 DIAGNOSIS — E785 Hyperlipidemia, unspecified: Secondary | ICD-10-CM | POA: Diagnosis not present

## 2023-08-28 DIAGNOSIS — R42 Dizziness and giddiness: Secondary | ICD-10-CM | POA: Diagnosis not present

## 2023-08-28 DIAGNOSIS — H409 Unspecified glaucoma: Secondary | ICD-10-CM | POA: Diagnosis not present

## 2023-08-28 DIAGNOSIS — F0390 Unspecified dementia without behavioral disturbance: Secondary | ICD-10-CM | POA: Diagnosis not present

## 2023-08-28 DIAGNOSIS — F419 Anxiety disorder, unspecified: Secondary | ICD-10-CM | POA: Diagnosis not present

## 2023-08-28 DIAGNOSIS — L89322 Pressure ulcer of left buttock, stage 2: Secondary | ICD-10-CM | POA: Diagnosis not present

## 2023-08-28 DIAGNOSIS — Z7901 Long term (current) use of anticoagulants: Secondary | ICD-10-CM | POA: Diagnosis not present

## 2023-08-28 DIAGNOSIS — Z7189 Other specified counseling: Secondary | ICD-10-CM | POA: Diagnosis not present

## 2023-08-29 DIAGNOSIS — F32A Depression, unspecified: Secondary | ICD-10-CM | POA: Diagnosis not present

## 2023-08-29 DIAGNOSIS — R4182 Altered mental status, unspecified: Secondary | ICD-10-CM | POA: Diagnosis not present

## 2023-08-29 DIAGNOSIS — S069XAA Unspecified intracranial injury with loss of consciousness status unknown, initial encounter: Secondary | ICD-10-CM | POA: Diagnosis not present

## 2023-08-29 DIAGNOSIS — N390 Urinary tract infection, site not specified: Secondary | ICD-10-CM | POA: Diagnosis not present

## 2023-08-29 DIAGNOSIS — F0390 Unspecified dementia without behavioral disturbance: Secondary | ICD-10-CM | POA: Diagnosis not present

## 2023-08-30 DIAGNOSIS — S069XAA Unspecified intracranial injury with loss of consciousness status unknown, initial encounter: Secondary | ICD-10-CM | POA: Diagnosis not present

## 2023-08-30 DIAGNOSIS — R531 Weakness: Secondary | ICD-10-CM | POA: Diagnosis not present

## 2023-08-30 DIAGNOSIS — F0390 Unspecified dementia without behavioral disturbance: Secondary | ICD-10-CM | POA: Diagnosis not present

## 2023-08-30 DIAGNOSIS — G8929 Other chronic pain: Secondary | ICD-10-CM | POA: Diagnosis not present

## 2023-08-30 DIAGNOSIS — M25512 Pain in left shoulder: Secondary | ICD-10-CM | POA: Diagnosis not present

## 2023-08-31 DIAGNOSIS — R531 Weakness: Secondary | ICD-10-CM | POA: Diagnosis not present

## 2023-08-31 DIAGNOSIS — N1832 Chronic kidney disease, stage 3b: Secondary | ICD-10-CM | POA: Diagnosis not present

## 2023-08-31 DIAGNOSIS — M25512 Pain in left shoulder: Secondary | ICD-10-CM | POA: Diagnosis not present

## 2023-08-31 DIAGNOSIS — F0390 Unspecified dementia without behavioral disturbance: Secondary | ICD-10-CM | POA: Diagnosis not present

## 2023-09-04 DIAGNOSIS — F0390 Unspecified dementia without behavioral disturbance: Secondary | ICD-10-CM | POA: Diagnosis not present

## 2023-09-04 DIAGNOSIS — R531 Weakness: Secondary | ICD-10-CM | POA: Diagnosis not present

## 2023-09-04 DIAGNOSIS — F419 Anxiety disorder, unspecified: Secondary | ICD-10-CM | POA: Diagnosis not present

## 2023-09-05 DIAGNOSIS — R531 Weakness: Secondary | ICD-10-CM | POA: Diagnosis not present

## 2023-09-05 DIAGNOSIS — G8929 Other chronic pain: Secondary | ICD-10-CM | POA: Diagnosis not present

## 2023-09-05 DIAGNOSIS — F0390 Unspecified dementia without behavioral disturbance: Secondary | ICD-10-CM | POA: Diagnosis not present

## 2023-09-05 DIAGNOSIS — F419 Anxiety disorder, unspecified: Secondary | ICD-10-CM | POA: Diagnosis not present

## 2023-09-11 DIAGNOSIS — I130 Hypertensive heart and chronic kidney disease with heart failure and stage 1 through stage 4 chronic kidney disease, or unspecified chronic kidney disease: Secondary | ICD-10-CM | POA: Diagnosis not present

## 2023-09-11 DIAGNOSIS — I480 Paroxysmal atrial fibrillation: Secondary | ICD-10-CM | POA: Diagnosis not present

## 2023-09-11 DIAGNOSIS — F0284 Dementia in other diseases classified elsewhere, unspecified severity, with anxiety: Secondary | ICD-10-CM | POA: Diagnosis not present

## 2023-09-11 DIAGNOSIS — F411 Generalized anxiety disorder: Secondary | ICD-10-CM | POA: Diagnosis not present

## 2023-09-11 DIAGNOSIS — E039 Hypothyroidism, unspecified: Secondary | ICD-10-CM | POA: Diagnosis not present

## 2023-09-11 DIAGNOSIS — F32A Depression, unspecified: Secondary | ICD-10-CM | POA: Diagnosis not present

## 2023-09-11 DIAGNOSIS — N184 Chronic kidney disease, stage 4 (severe): Secondary | ICD-10-CM | POA: Diagnosis not present

## 2023-09-11 DIAGNOSIS — I5042 Chronic combined systolic (congestive) and diastolic (congestive) heart failure: Secondary | ICD-10-CM | POA: Diagnosis not present

## 2023-09-11 DIAGNOSIS — F0283 Dementia in other diseases classified elsewhere, unspecified severity, with mood disturbance: Secondary | ICD-10-CM | POA: Diagnosis not present

## 2023-09-12 ENCOUNTER — Encounter: Payer: Self-pay | Admitting: Sports Medicine

## 2023-09-12 ENCOUNTER — Ambulatory Visit
Admission: RE | Admit: 2023-09-12 | Discharge: 2023-09-12 | Disposition: A | Discharge status: Home / Self Care | Payer: Medicare HMO | Source: Ambulatory Visit | Attending: Sports Medicine

## 2023-09-12 ENCOUNTER — Ambulatory Visit (INDEPENDENT_AMBULATORY_CARE_PROVIDER_SITE_OTHER): Payer: Medicare HMO | Admitting: Sports Medicine

## 2023-09-12 VITALS — BP 120/70 | HR 88 | Temp 97.9°F | Resp 17 | Ht 61.0 in | Wt 158.2 lb

## 2023-09-12 DIAGNOSIS — R0602 Shortness of breath: Secondary | ICD-10-CM

## 2023-09-12 DIAGNOSIS — K219 Gastro-esophageal reflux disease without esophagitis: Secondary | ICD-10-CM

## 2023-09-12 DIAGNOSIS — R051 Acute cough: Secondary | ICD-10-CM

## 2023-09-12 DIAGNOSIS — R6889 Other general symptoms and signs: Secondary | ICD-10-CM | POA: Diagnosis not present

## 2023-09-12 DIAGNOSIS — R509 Fever, unspecified: Secondary | ICD-10-CM | POA: Diagnosis not present

## 2023-09-12 DIAGNOSIS — R059 Cough, unspecified: Secondary | ICD-10-CM | POA: Diagnosis not present

## 2023-09-12 DIAGNOSIS — R062 Wheezing: Secondary | ICD-10-CM

## 2023-09-12 LAB — POC COVID19 BINAXNOW: SARS Coronavirus 2 Ag: NEGATIVE

## 2023-09-12 MED ORDER — PREDNISONE 20 MG PO TABS: 20.0000 mg | ORAL_TABLET | Freq: Every day | ORAL | 0 refills | Status: DC

## 2023-09-12 MED ORDER — PANTOPRAZOLE SODIUM 40 MG PO TBEC: 40.0000 mg | DELAYED_RELEASE_TABLET | Freq: Every day | ORAL | 1 refills | Status: AC

## 2023-09-12 MED ORDER — ALBUTEROL SULFATE HFA 108 (90 BASE) MCG/ACT IN AERS: 2.0000 | INHALATION_SPRAY | Freq: Four times a day (QID) | RESPIRATORY_TRACT | 2 refills | Status: AC | PRN

## 2023-09-12 MED ORDER — BENZONATATE 200 MG PO CAPS: 200.0000 mg | ORAL_CAPSULE | Freq: Two times a day (BID) | ORAL | 0 refills | Status: DC | PRN

## 2023-09-12 NOTE — Progress Notes (Signed)
 Careteam: Patient Care Team: Caro [name] POUR, NP as PCP - General (Geriatric Medicine) Court Dorn PARAS, MD as PCP - Cardiology (Cardiology) Abran Norleen SAILOR, MD as Consulting Physician (Gastroenterology) Liam Lerner, MD as Consulting Physician (Orthopedic Surgery) Roz Anes, MD as Consulting Physician (Ophthalmology) Gaston Hamilton, MD as Consulting Physician (Urology) Serene Gaile ORN, MD as Consulting Physician (Vascular Surgery) Georjean Darice HERO, MD as Consulting Physician (Neurology) Raj Rankin SAUNDERS, MD as Referring Physician (Ophthalmology) Franchot [name], PMHNP (Inactive) as Nurse Practitioner (Psychiatry) Georjean Darice HERO, MD as Consulting Physician (Neurology)  PLACE OF SERVICE:  [facility]  Advanced Directive information Does Patient Have a Medical Advance Directive?: Yes, Type of Advance Directive: Healthcare Power of Attorney, Does patient want to make changes to medical advance directive?: No - Patient declined  Allergies  Allergen Reactions   Lidocaine  Swelling and Other (See Comments)    Use CORRECT dose for age/weight- red welts and tongue swelling result, if not   Cymbalta  [Duloxetine  Hcl] Other (See Comments)    Increased confusion and memory concerns    Donepezil Other (See Comments)    Altered mood and Aggression     Mobic [Meloxicam] Other (See Comments)    Heart failure- cannot take   Namenda [Memantine] Other (See Comments)    Severe aggression   Nsaids Other (See Comments)    Cannot have due to heart issues   Other Other (See Comments)    No salt or anything that might cause fluid retention/swelling!!   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    Discussed the use  of AI scribe software for clinical note transcription with the patient, who gave verbal consent to proceed.  History of Present Illness   Deanna [name] is an 83 year old female who presents with a persistent cough and wheezing following recent hospitalization and rehabilitation. She is accompanied by her daughter.  Over the past three days, she has developed a persistent cough and wheezing. The cough is deep in her chest and is accompanied by a wheezing sound. She experienced a fever last night; daughter did not check with a thermometer but felt warm. She has a history of smoking but no known history of asthma or COPD.  pt c/o exertional SOB and wheezing with movements. She reports nausea and a decreased appetite since yesterday, although she was eating well after returning from rehab. No vomiting, diarrhea, or pain with urination. She has a history of a UTI during her recent hospitalization, which has since resolved.  She has a history of postnasal drip due to allergies, which she experiences daily. She reports a sore throat, particularly noticeable last night and this morning, possibly exacerbated by mouth breathing. No ear pain or sinus pain. Her cough produces very little phlegm, and she has started taking otc cough medicines.  She has a history of a severe head injury resulting in dizziness and typically uses furniture for support when moving around. Recently, she has become more dependent on a walker and requires assistance due to dizziness and weakness.               Review of Systems:  Review of Systems  Constitutional:  Positive for fever. Negative for chills.  HENT:  Positive for congestion and sore throat.   Eyes:  Negative for double vision.  Respiratory:  Positive for cough, shortness of breath and wheezing. Negative for sputum production.   Cardiovascular:  Negative for chest pain, palpitations and leg swelling.  Gastrointestinal:  Negative for abdominal pain, heartburn and  nausea.  Genitourinary:  Negative for dysuria, frequency and hematuria.  Musculoskeletal:  Negative for falls and myalgias.  Neurological:  Negative for dizziness, sensory change and focal weakness.   Negative unless indicated in HPI.   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   CKD (chronic kidney disease), stage IV (HCC)    Cognitive impairment    Colon polyps    Community acquired pneumonia    Depression    Diverticulosis    Dysuria    Encephalomalacia    GAD (generalized anxiety disorder)    GERD (gastroesophageal reflux disease)    Glaucoma    Hearing loss    Hemorrhoids    Hypertension    Hypothyroidism    Incontinence    Mixed hyperlipidemia    Myocardial infarction (HCC)    NSTEMI (non-ST elevated myocardial infarction) (HCC) 12/19/2011   OSA on CPAP    PAF (paroxysmal atrial fibrillation) (HCC)    Peripheral arterial disease (HCC)    Pneumonitis 04/26/2015   Pyelonephritis due to Escherichia coli 04/28/2015   Sepsis (HCC)    Spondylosis of lumbar region without myelopathy or radiculopathy    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HCC)    Urticaria    Vertigo, benign positional    Past Surgical History:  Procedure Laterality Date   ABDOMINAL AORTOGRAM W/LOWER EXTREMITY N/A 08/22/2017   Procedure: ABDOMINAL AORTOGRAM W/LOWER EXTREMITY;  Surgeon: Serene Gaile ORN, MD;  Location: [facility] INVASIVE CV LAB;  Service: Cardiovascular;  Laterality: N/A;   BILATERAL UPPER EXTREMITY ANGIOGRAM N/A 09/11/2012   Procedure: BILATERAL UPPER EXTREMITY ANGIOGRAM;  Surgeon: Gaile ORN Serene, MD;  Location: [facility] CATH LAB;  Service: Cardiovascular;  Laterality: N/A;   BIOPSY  03/25/2020    Procedure: BIOPSY;  Surgeon: Wilhelmenia Aloha Raddle., MD;  Location: [facility] ENDOSCOPY;  Service: Gastroenterology;;   carotid duplex doppler Bilateral 09/03/2012, 11/03/2011   Evidence of 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 patent left common carotid- subclavian artery bypass with turbulent flow noted at the anastomosis with velocities of 295 cm/s   CAROTID-SUBCLAVIAN BYPASS GRAFT  12/15/2011   Procedure: BYPASS GRAFT CAROTID-SUBCLAVIAN;  Surgeon: Gaile ORN Serene, MD;  Location: [facility] OR;  Service: Vascular;  Laterality: Left;  Left Carotid subclavian bypass   CORONARY ANGIOPLASTY     CORONARY ARTERY BYPASS GRAFT     CORONARY ATHERECTOMY N/A 03/19/2020   Procedure: CORONARY ATHERECTOMY;  Surgeon: Dann Candyce RAMAN, MD;  Location: [facility] INVASIVE CV LAB;  Service: Cardiovascular;  Laterality: N/A;   CORONARY STENT INTERVENTION N/A 03/19/2020   Procedure: CORONARY STENT INTERVENTION;  Surgeon: Dann Candyce RAMAN, MD;  Location: [facility] INVASIVE CV LAB;  Service: Cardiovascular;  Laterality: N/A;   DOPPLER ECHOCARDIOGRAPHY  05/27/2010, 09/17/2008   Mild Proximal septal thickening is noted. Left ventricular systolic functions is normal ejection fraction =>  55%. the aortic valve appears to be mildly sclerotic    ESOPHAGOGASTRODUODENOSCOPY (EGD) WITH PROPOFOL  N/A 03/25/2020   Procedure: ESOPHAGOGASTRODUODENOSCOPY (EGD) WITH PROPOFOL ;  Surgeon: Wilhelmenia Aloha Raddle., MD;  Location: [facility] ENDOSCOPY;  Service: Gastroenterology;  Laterality: N/A;   fem-fem bypass graft  1999   holter monitor  01/21/2008   The predominant rhythm was normal sinus rhythm. Minimum heartrate of 63 bpm at +01:00, maximum heartrate of 105 bpm at + 10:35; and the average heartrate of 75 bpm. Ventricular ectopic activity totaled 1270: Multifocal; 866-PVC's and 404-VEs              JOINT REPLACEMENT     Left  knee   LEFT HEART CATH AND CORS/GRAFTS ANGIOGRAPHY N/A 03/19/2020   Procedure: LEFT HEART CATH AND CORS/GRAFTS ANGIOGRAPHY;  Surgeon: Dann Candyce RAMAN, MD;  Location: [facility] INVASIVE CV LAB;  Service: Cardiovascular;  Laterality: N/A;   LEFT HEART CATHETERIZATION WITH CORONARY/GRAFT ANGIOGRAM N/A 12/21/2011   Procedure: LEFT HEART CATHETERIZATION WITH EL BILE;  Surgeon: Alm LELON Clay, MD;  Location: [facility] CATH LAB;  Service: Cardiovascular;  Laterality: N/A;   NM MYOCAR PERF EJECTION FRACTION  09/22/2009, 07/03/2007   the post stress myocardial perfusion images show a normal pattern of perfusion is all regions. The post-stress ejection fraction is 68 %. no significant wall motion abnormalities noted. This is a low risk scan.   PERIPHERAL VASCULAR INTERVENTION  08/22/2017   Procedure: PERIPHERAL VASCULAR INTERVENTION;  Surgeon: Serene Gaile LELON, MD;  Location: [facility] INVASIVE CV LAB;  Service: Cardiovascular;;  Fem/Fem Graft   REPLACEMENT TOTAL KNEE  05-2011   UNILATERAL UPPER EXTREMEITY ANGIOGRAM Left 11/15/2011   Procedure: UNILATERAL UPPER VENITA BILE;  Surgeon: Dorn JINNY Lesches, MD;  Location: [facility] CATH LAB;  Service: Cardiovascular;  Laterality: Left;   Social History:   reports that she quit smoking about 41 years ago. Her smoking use included cigarettes. She started smoking about 44 years ago. She has never used smokeless tobacco. She reports that she does not currently use alcohol. She reports that she does not use drugs.  Family History  Problem Relation Age of Onset   Heart attack Mother    Heart disease Mother        before age 58   Diabetes Father    Heart disease Father    Hypertension Father    Hyperlipidemia Father    Heart attack Father 6   Heart attack Brother 71   Cerebral palsy Sister 18   Congestive Heart Failure Sister 54   Heart attack Sister 81   Hypertension Sister 34   Dementia Sister    Anxiety disorder Sister    Anxiety disorder Sister 73    Heart Problems Sister    Stroke Sister    Heart Problems Sister 68   Heart attack Sister 55   Colon cancer Brother 44   Prostate cancer Brother 68   Hypertension Daughter    Irritable bowel syndrome Daughter    Depression Daughter    Anxiety disorder Daughter     Medications: Patient's Medications  New Prescriptions   ALBUTEROL  (VENTOLIN  HFA) 108 (90 BASE) MCG/ACT INHALER    Inhale 2 puffs into the lungs every 6 (six) hours as needed for wheezing or shortness of breath.   BENZONATATE  (TESSALON ) 200 MG CAPSULE    Take 1 capsule (200 mg total) by mouth 2 (two) times daily as needed for cough.   PREDNISONE  (DELTASONE ) 20 MG TABLET    Take 1 tablet (20 mg total)  by mouth daily with breakfast.  Previous Medications   ALPRAZOLAM  (XANAX ) 0.5 MG TABLET    Take 1 tablet (0.5 mg total) by mouth daily as needed for up to 2 doses for anxiety.   ASPIRIN  EC 81 MG TABLET    Take 81 mg by mouth at bedtime.   BUSPIRONE  (BUSPAR ) 15 MG TABLET    Take 1 tablet (15 mg total) by mouth 2 (two) times daily.   CHOLECALCIFEROL  (VITAMIN D3) 125 MCG (5000 UT) CAPS    Take 5,000 Units by mouth at bedtime.   CLOPIDOGREL  (PLAVIX ) 75 MG TABLET    TAKE 1 TABLET(75 MG) BY MOUTH DAILY   CYANOCOBALAMIN  (VITAMIN B-12 PO)    Take 1 tablet by mouth in the morning.   DIVALPROEX  (DEPAKOTE  ER) 250 MG 24 HR TABLET    Take 1 tablet (250 mg total) by mouth at bedtime.   DORZOLAMIDE  (TRUSOPT ) 2 % OPHTHALMIC SOLUTION    Place 1 drop into both eyes in the morning and at bedtime.   ESCITALOPRAM  (LEXAPRO ) 20 MG TABLET    Take 1 tablet (20 mg total) by mouth daily.   EZETIMIBE  (ZETIA ) 10 MG TABLET    Take 10 mg by mouth daily.   FUROSEMIDE  (LASIX ) 40 MG TABLET    TAKE 1 TABLET(40 MG) BY MOUTH DAILY   GABAPENTIN  (NEURONTIN ) 100 MG CAPSULE    TAKE 2 CAPSULES(200 MG) BY MOUTH DAILY   KETOCONAZOLE  (NIZORAL ) 2 % SHAMPOO    APPLY TOPICALLY 2 TIMES A WEEK.   LEVOTHYROXINE  (SYNTHROID ) 175 MCG TABLET    TAKE 1 TABLET BY MOUTH DAILY AT THE  SAME TIME AND SEPARATE FROM ALL OTHER MEDICATIONS   MECLIZINE  (ANTIVERT ) 25 MG TABLET    Take 1 tablet (25 mg total) by mouth as needed for dizziness.   METOPROLOL  SUCCINATE (TOPROL -XL) 25 MG 24 HR TABLET    Take 25 mg by mouth daily.   NITROGLYCERIN  (NITROSTAT ) 0.4 MG SL TABLET    Place 1 tablet (0.4 mg total) under the tongue every 5 (five) minutes x 3 doses as needed for chest pain.   ROSUVASTATIN  (CRESTOR ) 40 MG TABLET    TAKE 1 TABLET(40 MG) BY MOUTH DAILY   TYLENOL  500 MG TABLET    Take 1,000 mg by mouth in the morning.  Modified Medications   Modified Medication Previous Medication   PANTOPRAZOLE  (PROTONIX ) 40 MG TABLET pantoprazole  (PROTONIX ) 40 MG tablet      Take 1 tablet (40 mg total) by mouth daily.    TAKE 1 TABLET EVERY DAY  Discontinued Medications   No medications on file    Physical Exam: Vitals:   09/12/23 1324  BP: 120/70  Pulse: 88  Resp: 17  Temp: 97.9 F (36.6 C)  SpO2: 92%  Weight: 158 lb 3.2 oz (71.8 kg)  Height: 5' 1 (1.549 m)   Body mass index is 29.89 kg/m. BP Readings from Last 3 Encounters:  09/12/23 120/70  08/23/23 (!) 148/85  06/20/23 121/80   Wt Readings from Last 3 Encounters:  09/12/23 158 lb 3.2 oz (71.8 kg)  08/16/23 171 lb 1.2 oz (77.6 kg)  06/20/23 171 lb (77.6 kg)    Physical Exam Constitutional:      Appearance: Normal appearance.  HENT:     Head: Normocephalic and atraumatic.  Cardiovascular:     Rate and Rhythm: Normal rate and regular rhythm.  Pulmonary:     Effort: Pulmonary effort is normal. No respiratory distress.     Breath  sounds: Wheezing and rales (LOWER 1/3) present.  Abdominal:     General: Bowel sounds are normal. There is no distension.     Tenderness: There is no abdominal tenderness. There is no guarding or rebound.     Comments:    Musculoskeletal:        General: No swelling or tenderness.  Skin:    General: Skin is dry.  Neurological:     Mental Status: She is alert. Mental status is at baseline.      Sensory: No sensory deficit.     Motor: No weakness.     Labs reviewed: Basic Metabolic Panel: Recent Labs    02/02/23 1049 06/16/23 1558 08/16/23 1358 08/16/23 1408 08/17/23 0717  NA 141 139 137 138 134*  K 3.7 3.6 4.2 4.3 3.8  CL 99 99 103 102 97*  CO2 28 26 25   --  27  GLUCOSE 98 104 100* 92 101*  BUN 22 24 20 18 17   CREATININE 1.38* 1.45* 1.08* 1.30* 1.18*  CALCIUM  9.4 9.4 9.1  --  9.2  TSH 9.97* 0.41  --   --  6.574*   Liver Function Tests: Recent Labs    02/02/23 1049 06/16/23 1558 08/16/23 1358  AST 11 13 19   ALT 10 10 28   ALKPHOS  --   --  63  BILITOT 1.0 1.3* 1.9*  PROT 6.4 6.7 6.8  ALBUMIN   --   --  4.0   No results for input(s): LIPASE, AMYLASE in the last 8760 hours. No results for input(s): AMMONIA in the last 8760 hours. CBC: Recent Labs    02/02/23 1049 06/16/23 1558 08/16/23 1358 08/16/23 1408 08/17/23 0717  WBC 9.5 8.2 5.4  --  5.5  NEUTROABS 5,947 5,437 3.2  --   --   HGB 14.5 13.9 12.9 13.3 12.8  HCT 43.3 42.4 38.5 39.0 39.8  MCV 86.1 85.5 88.1  --  88.8  PLT 204 209 152  --  150   Lipid Panel: Recent Labs    06/16/23 1558  CHOL 98  HDL 32*  LDLCALC 40  TRIG 812*  CHOLHDL 3.1   TSH: Recent Labs    02/02/23 1049 06/16/23 1558 08/17/23 0717  TSH 9.97* 0.41 6.574*   A1C: Lab Results  Component Value Date   HGBA1C 5.8 (H) 03/18/2020    Assessment and Plan    Respiratory Infection Recent onset of cough, wheezing, and fever. No significant sputum production. Wheezing noted on examination. Negative COVID-19 test. -Order chest x-ray and blood work  -Prescribe Tessalon  for cough. -Prescribe Albuterol  inhaler for wheezing, to be used every 6 hours as needed. -will start prednisone  40mg  daily x 5 days  Sore Throat Complaints of sore throat and postnasal drip. Throat erythema noted on examination. -Advise use of Cepacol lozenges and salt water  gargles for symptomatic relief.  General Health  Maintenance -Continue Protonix  as prescribed. -Plan for physical therapy. -Follow-up with cardiology as scheduled in March.          Return for follow up with PCP.:    30 min Total time spent for obtaining history,  performing a medically appropriate examination and evaluation, reviewing the tests,ordering  tests,  documenting clinical information in the electronic or other health record,  care coordination (not separately reported)

## 2023-09-13 LAB — CBC WITH DIFFERENTIAL/PLATELET
Absolute Lymphocytes: 1701 {cells}/uL (ref 850–3900)
Absolute Monocytes: 647 {cells}/uL (ref 200–950)
Basophils Absolute: 42 {cells}/uL (ref 0–200)
Basophils Relative: 1 %
Eosinophils Absolute: 151 {cells}/uL (ref 15–500)
Eosinophils Relative: 3.6 %
HCT: 42.9 % (ref 35.0–45.0)
Hemoglobin: 14 g/dL (ref 11.7–15.5)
MCH: 28.4 pg (ref 27.0–33.0)
MCHC: 32.6 g/dL (ref 32.0–36.0)
MCV: 87 fL (ref 80.0–100.0)
MPV: 9.7 fL (ref 7.5–12.5)
Monocytes Relative: 15.4 %
Neutro Abs: 1659 {cells}/uL (ref 1500–7800)
Neutrophils Relative %: 39.5 %
Platelets: 155 10*3/uL (ref 140–400)
RBC: 4.93 10*6/uL (ref 3.80–5.10)
RDW: 13.4 % (ref 11.0–15.0)
Total Lymphocyte: 40.5 %
WBC: 4.2 10*3/uL (ref 3.8–10.8)

## 2023-09-13 LAB — BASIC METABOLIC PANEL WITH GFR
BUN/Creatinine Ratio: 18 (calc) (ref 6–22)
BUN: 26 mg/dL — ABNORMAL HIGH (ref 7–25)
CO2: 30 mmol/L (ref 20–32)
Calcium: 9.7 mg/dL (ref 8.6–10.4)
Chloride: 99 mmol/L (ref 98–110)
Creat: 1.47 mg/dL — ABNORMAL HIGH (ref 0.60–0.95)
Glucose, Bld: 96 mg/dL (ref 65–139)
Potassium: 5.1 mmol/L (ref 3.5–5.3)
Sodium: 139 mmol/L (ref 135–146)
eGFR: 36 mL/min/{1.73_m2} — ABNORMAL LOW (ref >=60)

## 2023-09-19 DIAGNOSIS — F32A Depression, unspecified: Secondary | ICD-10-CM | POA: Diagnosis not present

## 2023-09-19 DIAGNOSIS — E039 Hypothyroidism, unspecified: Secondary | ICD-10-CM | POA: Diagnosis not present

## 2023-09-19 DIAGNOSIS — I130 Hypertensive heart and chronic kidney disease with heart failure and stage 1 through stage 4 chronic kidney disease, or unspecified chronic kidney disease: Secondary | ICD-10-CM | POA: Diagnosis not present

## 2023-09-19 DIAGNOSIS — F411 Generalized anxiety disorder: Secondary | ICD-10-CM | POA: Diagnosis not present

## 2023-09-19 DIAGNOSIS — I480 Paroxysmal atrial fibrillation: Secondary | ICD-10-CM | POA: Diagnosis not present

## 2023-09-19 DIAGNOSIS — I5042 Chronic combined systolic (congestive) and diastolic (congestive) heart failure: Secondary | ICD-10-CM | POA: Diagnosis not present

## 2023-09-19 DIAGNOSIS — F0284 Dementia in other diseases classified elsewhere, unspecified severity, with anxiety: Secondary | ICD-10-CM | POA: Diagnosis not present

## 2023-09-19 DIAGNOSIS — N184 Chronic kidney disease, stage 4 (severe): Secondary | ICD-10-CM | POA: Diagnosis not present

## 2023-09-19 DIAGNOSIS — F0283 Dementia in other diseases classified elsewhere, unspecified severity, with mood disturbance: Secondary | ICD-10-CM | POA: Diagnosis not present

## 2023-09-21 ENCOUNTER — Telehealth: Payer: Self-pay

## 2023-09-21 NOTE — Telephone Encounter (Signed)
Cara calling from [facility] for verbal orders Nursing once a week for 6 weeks for nursing patient was hospitalized for a UTI.

## 2023-09-21 NOTE — Telephone Encounter (Signed)
Okay to give order

## 2023-09-25 ENCOUNTER — Telehealth: Payer: Self-pay | Admitting: *Deleted

## 2023-09-25 ENCOUNTER — Telehealth: Payer: Self-pay

## 2023-09-25 DIAGNOSIS — K089 Disorder of teeth and supporting structures, unspecified: Secondary | ICD-10-CM

## 2023-09-25 DIAGNOSIS — H401132 Primary open-angle glaucoma, bilateral, moderate stage: Secondary | ICD-10-CM

## 2023-09-25 NOTE — Telephone Encounter (Signed)
 Jim with [facility] OT called requesting verbal orders for OT 1x5 weeks.   Verbal Orders given.

## 2023-09-25 NOTE — Telephone Encounter (Signed)
 Noted thank you

## 2023-09-25 NOTE — Telephone Encounter (Signed)
 Message left on Clinical Intake voicemail:  Physical therapist from [facility] is requesting verbal orders for PT 1x1, 2x4, and 1x3.   Call returned to [facility] and verbal ok given ( Per [facility] standing protocol )

## 2023-09-25 NOTE — Telephone Encounter (Signed)
 Patient daughter, Dot Lanes called requesting Referrals to be placed for patient.  1) Eye Dr due to Glaucoma (Dr. Nile Riggs, her former Dr, is no longer in practice.)  2)Dentist for a chipped tooth.   Daughter is requesting referrals to be placed from PCP.

## 2023-09-26 NOTE — Telephone Encounter (Signed)
 Referrals have been placed.

## 2023-09-29 ENCOUNTER — Other Ambulatory Visit: Payer: Self-pay

## 2023-09-29 DIAGNOSIS — I25708 Atherosclerosis of coronary artery bypass graft(s), unspecified, with other forms of angina pectoris: Secondary | ICD-10-CM

## 2023-09-29 DIAGNOSIS — I6529 Occlusion and stenosis of unspecified carotid artery: Secondary | ICD-10-CM

## 2023-10-05 DIAGNOSIS — F32A Depression, unspecified: Secondary | ICD-10-CM | POA: Diagnosis not present

## 2023-10-05 DIAGNOSIS — I480 Paroxysmal atrial fibrillation: Secondary | ICD-10-CM | POA: Diagnosis not present

## 2023-10-05 DIAGNOSIS — N184 Chronic kidney disease, stage 4 (severe): Secondary | ICD-10-CM | POA: Diagnosis not present

## 2023-10-05 DIAGNOSIS — I5042 Chronic combined systolic (congestive) and diastolic (congestive) heart failure: Secondary | ICD-10-CM | POA: Diagnosis not present

## 2023-10-05 DIAGNOSIS — I130 Hypertensive heart and chronic kidney disease with heart failure and stage 1 through stage 4 chronic kidney disease, or unspecified chronic kidney disease: Secondary | ICD-10-CM | POA: Diagnosis not present

## 2023-10-05 DIAGNOSIS — F0284 Dementia in other diseases classified elsewhere, unspecified severity, with anxiety: Secondary | ICD-10-CM | POA: Diagnosis not present

## 2023-10-05 DIAGNOSIS — F411 Generalized anxiety disorder: Secondary | ICD-10-CM | POA: Diagnosis not present

## 2023-10-05 DIAGNOSIS — F0283 Dementia in other diseases classified elsewhere, unspecified severity, with mood disturbance: Secondary | ICD-10-CM | POA: Diagnosis not present

## 2023-10-05 DIAGNOSIS — E039 Hypothyroidism, unspecified: Secondary | ICD-10-CM | POA: Diagnosis not present

## 2023-10-06 DIAGNOSIS — F0283 Dementia in other diseases classified elsewhere, unspecified severity, with mood disturbance: Secondary | ICD-10-CM | POA: Diagnosis not present

## 2023-10-06 DIAGNOSIS — E039 Hypothyroidism, unspecified: Secondary | ICD-10-CM | POA: Diagnosis not present

## 2023-10-06 DIAGNOSIS — N184 Chronic kidney disease, stage 4 (severe): Secondary | ICD-10-CM | POA: Diagnosis not present

## 2023-10-06 DIAGNOSIS — F411 Generalized anxiety disorder: Secondary | ICD-10-CM | POA: Diagnosis not present

## 2023-10-06 DIAGNOSIS — F0284 Dementia in other diseases classified elsewhere, unspecified severity, with anxiety: Secondary | ICD-10-CM | POA: Diagnosis not present

## 2023-10-06 DIAGNOSIS — I130 Hypertensive heart and chronic kidney disease with heart failure and stage 1 through stage 4 chronic kidney disease, or unspecified chronic kidney disease: Secondary | ICD-10-CM | POA: Diagnosis not present

## 2023-10-06 DIAGNOSIS — F32A Depression, unspecified: Secondary | ICD-10-CM | POA: Diagnosis not present

## 2023-10-06 DIAGNOSIS — I480 Paroxysmal atrial fibrillation: Secondary | ICD-10-CM | POA: Diagnosis not present

## 2023-10-06 DIAGNOSIS — I5042 Chronic combined systolic (congestive) and diastolic (congestive) heart failure: Secondary | ICD-10-CM | POA: Diagnosis not present

## 2023-10-09 ENCOUNTER — Ambulatory Visit: Payer: Medicare HMO | Admitting: Surgery

## 2023-10-09 ENCOUNTER — Telehealth: Payer: Self-pay | Admitting: Cardiovascular Disease

## 2023-10-09 ENCOUNTER — Telehealth: Payer: Self-pay

## 2023-10-09 ENCOUNTER — Encounter: Payer: Self-pay | Admitting: Surgery

## 2023-10-09 ENCOUNTER — Ambulatory Visit (INDEPENDENT_AMBULATORY_CARE_PROVIDER_SITE_OTHER)
Admit: 2023-10-09 | Discharge: 2023-10-09 | Disposition: A | Discharge status: Home / Self Care | Payer: Medicare HMO | Attending: Surgery | Admitting: Surgery

## 2023-10-09 ENCOUNTER — Ambulatory Visit (HOSPITAL_COMMUNITY)
Admission: RE | Admit: 2023-10-09 | Discharge: 2023-10-09 | Disposition: A | Discharge status: Home / Self Care | Payer: Medicare HMO | Source: Ambulatory Visit | Attending: Surgery | Admitting: Surgery

## 2023-10-09 ENCOUNTER — Ambulatory Visit (INDEPENDENT_AMBULATORY_CARE_PROVIDER_SITE_OTHER)
Admission: RE | Admit: 2023-10-09 | Discharge: 2023-10-09 | Disposition: A | Discharge status: Home / Self Care | Payer: Medicare HMO | Source: Ambulatory Visit | Attending: Surgery | Admitting: Surgery

## 2023-10-09 VITALS — BP 164/96 | HR 98 | Temp 98.1°F | Ht 61.0 in | Wt 174.1 lb

## 2023-10-09 DIAGNOSIS — I6529 Occlusion and stenosis of unspecified carotid artery: Secondary | ICD-10-CM | POA: Diagnosis not present

## 2023-10-09 DIAGNOSIS — I6523 Occlusion and stenosis of bilateral carotid arteries: Secondary | ICD-10-CM | POA: Diagnosis not present

## 2023-10-09 DIAGNOSIS — I25708 Atherosclerosis of coronary artery bypass graft(s), unspecified, with other forms of angina pectoris: Secondary | ICD-10-CM | POA: Diagnosis not present

## 2023-10-09 LAB — VAS US ABI WITH/WO TBI: Right ABI: 1.06

## 2023-10-09 NOTE — Telephone Encounter (Signed)
   Name: Deanna [name]  DOB: 1940-10-01  MRN: [id number]  Primary Cardiologist: Nanetta Batty, MD  Chart reviewed as part of pre-operative protocol coverage. Because of KELLEEN STOLZE past medical history and time since last visit, she will require a follow-up in-office visit in order to better assess preoperative cardiovascular risk.  Pre-op covering staff: - Please schedule appointment and call patient to inform them. If patient already had an upcoming appointment within acceptable timeframe, please add "pre-op clearance" to the appointment notes so provider is aware. - Please contact requesting surgeon's office via preferred method (i.e, phone, fax) to inform them of need for appointment prior to surgery.  This message will also be routed to Dr. Allyson Sabal for input on holding Plavix/ASA as requested below so that this information is available to the clearing provider at time of patient's appointment.   Procedure:  Right CEA   Date of Surgery:  Clearance TBD                                  Surgeon:  Dr. Myra Gianotti Surgeon's Group or Practice Name:  [facility] Phone number:  [phone] Fax number:  [phone]   Type of Clearance Requested:   - Medical  - Pharmacy:  Hold Aspirin and Clopidogrel (Plavix) need instruction   Type of Anesthesia:  General     Perlie Gold, PA-C  10/09/2023, 3:21 PM

## 2023-10-09 NOTE — Telephone Encounter (Signed)
 Attempted to call for surgery scheduling. Deanna [name]

## 2023-10-09 NOTE — Telephone Encounter (Signed)
   Pre-operative Risk Assessment    Patient Name: Deanna [name]  DOB: 04-Dec-1940 MRN: [id number]   Date of last office visit: 12/09/2021 Date of next office visit: none   Request for Surgical Clearance    Procedure:  Right CEA  Date of Surgery:  Clearance TBD                                Surgeon:  Dr. Kristie Cowman Group or Practice Name:  [facility] Phone number:  [phone] Fax number:  [phone]   Type of Clearance Requested:   - Medical  - Pharmacy:  Hold Aspirin and Clopidogrel (Plavix) need instruction   Type of Anesthesia:  General    Additional requests/questions:    Sharen Hones   10/09/2023, 3:14 PM

## 2023-10-09 NOTE — Progress Notes (Signed)
 Vascular and Vein Specialist of   Patient name: Deanna [name] MRN: [id number] DOB: [birth year] Sex: female   REQUESTING PROVIDER:   PCP  REASON FOR CONSULT:    Re-establish vascular care  HISTORY OF PRESENT ILLNESS:   Deanna [name] is a 83 y.o. female, who is well-known to me for having undergone the following procedures: 12/15/2011: Left carotid subclavian bypass graft for dizziness and left arm numbness.  She had a seroma postoperatively which had to be surgically drained. 08/22/2017: Drug-coated balloon angioplasty, femoral-femoral bypass graft.  She has also had endovascular exclusion of a mid graft pseudoaneurysm using an 8 mm stent History of femoral-femoral bypass graft by Dr. Edwyna Shell in the remote past  She was lost to follow-up and has not been seen since 2019.  She not had any vascular issues since that time.  She denies claudication.  She does not have any neurologic symptoms such as amaurosis fugax numbness or weakness in either extremity or slurred speech.  Is been hospitalized recently for shortness of breath.  She follows up with cardiology at [facility] and at [facility].  PAST MEDICAL HISTORY    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   CKD (chronic kidney disease), stage IV (HCC)    Cognitive impairment    Colon polyps    Community acquired pneumonia    Depression    Diverticulosis    Dysuria    Encephalomalacia    GAD (generalized anxiety disorder)    GERD (gastroesophageal reflux disease)    Glaucoma    Hearing loss    Hemorrhoids    Hypertension    Hypothyroidism    Incontinence    Mixed hyperlipidemia     Myocardial infarction (HCC)    NSTEMI (non-ST elevated myocardial infarction) (HCC) 12/19/2011   OSA on CPAP    PAF (paroxysmal atrial fibrillation) (HCC)    Peripheral arterial disease (HCC)    Pneumonitis 04/26/2015   Pyelonephritis due to Escherichia coli 04/28/2015   Sepsis (HCC)    Spondylosis of lumbar region without myelopathy or radiculopathy    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HCC)    Urticaria    Vertigo, benign positional      FAMILY HISTORY   Family History  Problem Relation Age of Onset   Heart attack Mother    Heart disease Mother        before age 27   Diabetes Father    Heart disease Father    Hypertension Father    Hyperlipidemia Father    Heart attack Father 50   Heart attack Brother 28   Cerebral palsy Sister 60   Congestive Heart Failure Sister 22   Heart attack Sister 47   Hypertension Sister 45   Dementia Sister    Anxiety disorder Sister    Anxiety disorder Sister 35   Heart Problems Sister    Stroke Sister    Heart Problems Sister 29   Heart attack Sister 6   Colon cancer Brother 39   Prostate cancer Brother 51   Hypertension Daughter    Irritable bowel syndrome Daughter    Depression Daughter    Anxiety disorder Daughter     SOCIAL HISTORY:  Social History   Socioeconomic History   Marital status: Divorced    Spouse name: Not on file   Number of children: Not on file   Years of education: Not on file   Highest education level: Not on file  Occupational History   Not on file  Tobacco Use   Smoking status: Former    Current packs/day: 0.00    Types: Cigarettes    Start date: 11/28/1978    Quit date: 11/27/1981    Years since quitting: 41.8   Smokeless tobacco: Never  Vaping Use   Vaping status: Never Used  Substance and Sexual Activity   Alcohol use: Not Currently    Alcohol/week: 0.0 standard drinks of alcohol   Drug use: No   Sexual activity: Not Currently    Comment: 1st intercourse 23 yo-1 partner  Other Topics Concern    Not on file  Social History Narrative   Social History      Diet? Healthy but too many sweets      Do you drink/eat things with caffeine? Yes occasionally      Marital status?                    D                What year were you married?      Do you live in a house, apartment, assisted living, condo, trailer, etc.? condo      Is it one or more stories? 1      How many persons live in your home? 2      Do you have any pets in your home? (please list) 1 cat      Highest level of education completed? 1 year college      Current or past profession: housewife      Do you exercise?           no                           Type & how often?      Advanced Directives      Do you have a living will? no      Do you have a DNR form?             no                     If not, do you want to discuss one? yes      Do you have signed POA/HPOA for forms? no      Functional Status      Do you have difficulty bathing or dressing yourself? No- gets tired      Do you have difficulty preparing food or eating? No- eats frozen entrees or poor nutrition meals      Do you have difficulty managing your medications? yes      Do you have difficulty managing your finances? yes      Do you have difficulty affording your medications? No- funds running low   Social Drivers of Corporate investment banker Strain: Not on file  Food Insecurity: No Food Insecurity (06/17/2022)   Hunger Vital Sign    Worried About Running Out of Food in the Last Year: Never true    Ran Out of Food in the Last Year: Never true  Transportation Needs: No Transportation Needs (04/24/2023)   Received from  [facility]   PRAPARE - Transportation    In the past 12 months, has lack of transportation kept you from medical appointments or from getting medications?: No    Lack of Transportation (Non-Medical): No  Physical Activity: Not on file  Stress: Not on file  Social Connections: Not on file   Intimate Partner Violence: Not At Risk (06/17/2022)   Humiliation, Afraid, Rape, and Kick questionnaire    Fear of Current or Ex-Partner: No    Emotionally Abused: No    Physically Abused: No    Sexually Abused: No    ALLERGIES:    Allergies  Allergen Reactions   Lidocaine Swelling and Other (See Comments)    Use CORRECT dose for age/weight- red welts and tongue swelling result, if not   Cymbalta [Duloxetine Hcl] Other (See Comments)    Increased confusion and memory concerns    Donepezil Other (See Comments)    Altered mood and Aggression     Mobic [Meloxicam] Other (See Comments)    "Heart failure"- cannot take   Namenda [Memantine] Other (See Comments)    Severe aggression   Nsaids Other (See Comments)    Cannot have due to heart issues   Other Other (See Comments)    No salt or anything that might cause fluid retention/swelling!!   Oxycontin [Oxycodone] Other (See Comments)    "Toxic dementia" - daughter feels like she could take this, however(??)   Vicodin [Hydrocodone-Acetaminophen] Other (See Comments)    Delirium, Confusion, and Toxic dementia    CURRENT MEDICATIONS:    Current Outpatient Medications  Medication Sig Dispense Refill   albuterol (VENTOLIN HFA) 108 (90 Base) MCG/ACT inhaler Inhale 2 puffs into the lungs every 6 (six) hours as needed for wheezing or shortness of breath. 8 g 2   ALPRAZolam (XANAX) 0.5 MG tablet Take 1 tablet (0.5 mg total) by mouth daily as needed for up to 2 doses for anxiety. 2 tablet 0   aspirin EC 81 MG tablet Take 81 mg by mouth at bedtime.     busPIRone (BUSPAR) 15 MG tablet Take 1 tablet (15 mg total) by mouth 2 (two) times daily. 180 tablet 1   Cholecalciferol (VITAMIN D3) 125 MCG (5000 UT) CAPS Take 5,000 Units by mouth at bedtime.     clopidogrel (PLAVIX) 75 MG tablet TAKE 1 TABLET(75 MG) BY MOUTH DAILY (Patient taking differently: Take 75 mg by mouth at bedtime.) 90 tablet 3   Cyanocobalamin (VITAMIN B-12 PO) Take 1 tablet  by mouth in the morning.     divalproex (DEPAKOTE ER) 250 MG 24 hr tablet Take 1 tablet (250 mg total) by mouth at bedtime. 90 tablet 1   dorzolamide (TRUSOPT) 2 % ophthalmic solution Place 1 drop into both eyes in the morning and at bedtime.     escitalopram (LEXAPRO) 20 MG tablet Take 1 tablet (20 mg total) by mouth daily. 90 tablet 1   ezetimibe (ZETIA) 10 MG tablet Take 10 mg by mouth daily.     furosemide (LASIX) 40 MG tablet TAKE 1 TABLET(40 MG) BY MOUTH DAILY (Patient taking differently: Take 40 mg by mouth every other day.) 90 tablet 3   gabapentin (NEURONTIN) 100 MG capsule TAKE 2 CAPSULES(200 MG) BY MOUTH DAILY (Patient taking differently: Take 300 mg by mouth in the morning.) 60 capsule 11   ketoconazole (NIZORAL) 2 % shampoo APPLY TOPICALLY 2 TIMES A WEEK. (Patient taking differently: Apply 10 mLs topically 2 (two) times a week.) 120  mL 5   levothyroxine (SYNTHROID) 175 MCG tablet TAKE 1 TABLET BY MOUTH DAILY AT THE SAME TIME AND SEPARATE FROM ALL OTHER MEDICATIONS (Patient taking differently: Take 175 mcg by mouth See admin instructions. Take 175 mcg by mouth in the morning before breakfast- SEPARATE FROM ALL OTHER MEDICATIONS) 90 tablet 1   meclizine (ANTIVERT) 25 MG tablet Take 1 tablet (25 mg total) by mouth as needed for dizziness. (Patient taking differently: Take 25 mg by mouth 3 (three) times daily as needed for dizziness.) 30 tablet 0   metoprolol succinate (TOPROL-XL) 25 MG 24 hr tablet Take 25 mg by mouth daily.     nitroGLYCERIN (NITROSTAT) 0.4 MG SL tablet Place 1 tablet (0.4 mg total) under the tongue every 5 (five) minutes x 3 doses as needed for chest pain. 25 tablet 1   pantoprazole (PROTONIX) 40 MG tablet Take 1 tablet (40 mg total) by mouth daily. 90 tablet 1   rosuvastatin (CRESTOR) 40 MG tablet TAKE 1 TABLET(40 MG) BY MOUTH DAILY (Patient taking differently: Take 40 mg by mouth at bedtime.) 90 tablet 1   TYLENOL 500 MG tablet Take 1,000 mg by mouth in the morning.      benzonatate (TESSALON) 200 MG capsule Take 1 capsule (200 mg total) by mouth 2 (two) times daily as needed for cough. 20 capsule 0   predniSONE (DELTASONE) 20 MG tablet Take 1 tablet (20 mg total) by mouth daily with breakfast. 10 tablet 0   No current facility-administered medications for this visit.    REVIEW OF SYSTEMS:   [X]  denotes positive finding, [ ]  denotes negative finding Cardiac  Comments:  Chest pain or chest pressure:    Shortness of breath upon exertion:    Short of breath when lying flat:    Irregular heart rhythm:        Vascular    Pain in calf, thigh, or hip brought on by ambulation:    Pain in feet at night that wakes you up from your sleep:     Blood clot in your veins:    Leg swelling:         Pulmonary    Oxygen at home:    Productive cough:     Wheezing:         Neurologic    Sudden weakness in arms or legs:     Sudden numbness in arms or legs:     Sudden onset of difficulty speaking or slurred speech:    Temporary loss of vision in one eye:     Problems with dizziness:         Gastrointestinal    Blood in stool:      Vomited blood:         Genitourinary    Burning when urinating:     Blood in urine:        Psychiatric    Major depression:         Hematologic    Bleeding problems:    Problems with blood clotting too easily:        Skin    Rashes or ulcers:        Constitutional    Fever or chills:     PHYSICAL EXAM:   Vitals:   10/09/23 1407  BP: (!) 164/96  Pulse: 98  Temp: 98.1 F (36.7 C)  TempSrc: Temporal  SpO2: 90%  Weight: 174 lb 1.6 oz (79 kg)  Height: 5\' 1"  (1.549 m)    GENERAL:  The patient is a well-nourished female, in no acute distress. The vital signs are documented above. CARDIAC: There is a regular rate and rhythm.  VASCULAR: Palpable radial pulses.  SonoSite was used to by the carotid bifurcation which is in the mid neck PULMONARY: Nonlabored respirations ABDOMEN: Soft and non-tender MUSCULOSKELETAL:  There are no major deformities or cyanosis. NEUROLOGIC: No focal weakness or paresthesias are detected. SKIN: There are no ulcers or rashes noted. PSYCHIATRIC: The patient has a normal affect.  STUDIES:   I have reviewed the following: +-------+-----------+-----------+------------+------------+  ABI/TBIToday's ABIToday's TBIPrevious ABIPrevious TBI  +-------+-----------+-----------+------------+------------+  Right 1.06       0.72       0.88        0.38          +-------+-----------+-----------+------------+------------+  Left  Hessmer         0.87       1.0         0.62          +-------+-----------+-----------+------------+------------+   - Patent left to right femoral to femoral bypass graft with elevated  velocities at the right profunda artery outflow (50-74%).  - Avascular structure at the bypass graft midline measuring 3.2 cm x 3.8  cm (pseudoaneurysm repair 2017).   Carotid: Right Carotid: Velocities in the right ICA are consistent with a 80-99%                 stenosis. Non-hemodynamically significant plaque <50% noted  in                the CCA. The ECA appears >50% stenosed.                   Increased velocities of the right ICA compared to prior  exam.   Left Carotid: Patent CCA to SCA bypass graft with no evidence of stenosis.                Velocities in the left ICA are consistent with a 40-59%  stenosis.               Non-hemodynamically significant plaque <50% noted in the  CCA. The                ECA appears <50% stenosed.   Vertebrals:  Bilateral vertebral arteries demonstrate antegrade flow.  Subclavians: Right subclavian artery was stenotic.  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  I discussed our treatment options.  The lesion appears calcified by ultrasound.  The bifurcation is in the mid neck.  We talked about proceeding with a right carotid endarterectomy for  stroke prevention I discussed the risk of nerve injury and stroke as well as incisional complications.  All questions were answered and they wish to proceed.  I will stop her Plavix 5 days ahead of time.   Her left carotid subclavian bypass and her femoral-femoral bypass graft are widely patent  We will reach out to cardiology to make sure there are no contraindications to proceeding with surgery.     Charlena Cross, MD, FACS [facility] [phone] Pager [phone]

## 2023-10-10 ENCOUNTER — Telehealth: Payer: Self-pay

## 2023-10-10 DIAGNOSIS — I130 Hypertensive heart and chronic kidney disease with heart failure and stage 1 through stage 4 chronic kidney disease, or unspecified chronic kidney disease: Secondary | ICD-10-CM | POA: Diagnosis not present

## 2023-10-10 DIAGNOSIS — N184 Chronic kidney disease, stage 4 (severe): Secondary | ICD-10-CM | POA: Diagnosis not present

## 2023-10-10 DIAGNOSIS — I5042 Chronic combined systolic (congestive) and diastolic (congestive) heart failure: Secondary | ICD-10-CM | POA: Diagnosis not present

## 2023-10-10 DIAGNOSIS — I480 Paroxysmal atrial fibrillation: Secondary | ICD-10-CM | POA: Diagnosis not present

## 2023-10-10 DIAGNOSIS — F0284 Dementia in other diseases classified elsewhere, unspecified severity, with anxiety: Secondary | ICD-10-CM | POA: Diagnosis not present

## 2023-10-10 DIAGNOSIS — F32A Depression, unspecified: Secondary | ICD-10-CM | POA: Diagnosis not present

## 2023-10-10 DIAGNOSIS — F411 Generalized anxiety disorder: Secondary | ICD-10-CM | POA: Diagnosis not present

## 2023-10-10 DIAGNOSIS — F0283 Dementia in other diseases classified elsewhere, unspecified severity, with mood disturbance: Secondary | ICD-10-CM | POA: Diagnosis not present

## 2023-10-10 DIAGNOSIS — E039 Hypothyroidism, unspecified: Secondary | ICD-10-CM | POA: Diagnosis not present

## 2023-10-10 NOTE — Telephone Encounter (Signed)
 Per daughter, Deanna [name], patient has appt for cardiac clearance on 3/17.  Will resume surgery scheduling after cardiac clearance.

## 2023-10-11 DIAGNOSIS — F0284 Dementia in other diseases classified elsewhere, unspecified severity, with anxiety: Secondary | ICD-10-CM | POA: Diagnosis not present

## 2023-10-11 DIAGNOSIS — I5042 Chronic combined systolic (congestive) and diastolic (congestive) heart failure: Secondary | ICD-10-CM | POA: Diagnosis not present

## 2023-10-11 DIAGNOSIS — E039 Hypothyroidism, unspecified: Secondary | ICD-10-CM | POA: Diagnosis not present

## 2023-10-11 DIAGNOSIS — I130 Hypertensive heart and chronic kidney disease with heart failure and stage 1 through stage 4 chronic kidney disease, or unspecified chronic kidney disease: Secondary | ICD-10-CM | POA: Diagnosis not present

## 2023-10-11 DIAGNOSIS — N184 Chronic kidney disease, stage 4 (severe): Secondary | ICD-10-CM | POA: Diagnosis not present

## 2023-10-11 DIAGNOSIS — F411 Generalized anxiety disorder: Secondary | ICD-10-CM | POA: Diagnosis not present

## 2023-10-11 DIAGNOSIS — I480 Paroxysmal atrial fibrillation: Secondary | ICD-10-CM | POA: Diagnosis not present

## 2023-10-11 DIAGNOSIS — F0283 Dementia in other diseases classified elsewhere, unspecified severity, with mood disturbance: Secondary | ICD-10-CM | POA: Diagnosis not present

## 2023-10-11 DIAGNOSIS — F32A Depression, unspecified: Secondary | ICD-10-CM | POA: Diagnosis not present

## 2023-10-12 ENCOUNTER — Other Ambulatory Visit: Payer: Self-pay | Admitting: Nurse Practitioner

## 2023-10-12 DIAGNOSIS — E039 Hypothyroidism, unspecified: Secondary | ICD-10-CM

## 2023-10-16 DIAGNOSIS — F0284 Dementia in other diseases classified elsewhere, unspecified severity, with anxiety: Secondary | ICD-10-CM | POA: Diagnosis not present

## 2023-10-16 DIAGNOSIS — F0283 Dementia in other diseases classified elsewhere, unspecified severity, with mood disturbance: Secondary | ICD-10-CM | POA: Diagnosis not present

## 2023-10-16 DIAGNOSIS — E039 Hypothyroidism, unspecified: Secondary | ICD-10-CM | POA: Diagnosis not present

## 2023-10-16 DIAGNOSIS — F32A Depression, unspecified: Secondary | ICD-10-CM | POA: Diagnosis not present

## 2023-10-16 DIAGNOSIS — I130 Hypertensive heart and chronic kidney disease with heart failure and stage 1 through stage 4 chronic kidney disease, or unspecified chronic kidney disease: Secondary | ICD-10-CM | POA: Diagnosis not present

## 2023-10-16 DIAGNOSIS — N184 Chronic kidney disease, stage 4 (severe): Secondary | ICD-10-CM | POA: Diagnosis not present

## 2023-10-16 DIAGNOSIS — I480 Paroxysmal atrial fibrillation: Secondary | ICD-10-CM | POA: Diagnosis not present

## 2023-10-16 DIAGNOSIS — F411 Generalized anxiety disorder: Secondary | ICD-10-CM | POA: Diagnosis not present

## 2023-10-16 DIAGNOSIS — I5042 Chronic combined systolic (congestive) and diastolic (congestive) heart failure: Secondary | ICD-10-CM | POA: Diagnosis not present

## 2023-10-18 DIAGNOSIS — F0284 Dementia in other diseases classified elsewhere, unspecified severity, with anxiety: Secondary | ICD-10-CM | POA: Diagnosis not present

## 2023-10-18 DIAGNOSIS — E039 Hypothyroidism, unspecified: Secondary | ICD-10-CM | POA: Diagnosis not present

## 2023-10-18 DIAGNOSIS — F0283 Dementia in other diseases classified elsewhere, unspecified severity, with mood disturbance: Secondary | ICD-10-CM | POA: Diagnosis not present

## 2023-10-18 DIAGNOSIS — I5042 Chronic combined systolic (congestive) and diastolic (congestive) heart failure: Secondary | ICD-10-CM | POA: Diagnosis not present

## 2023-10-18 DIAGNOSIS — F411 Generalized anxiety disorder: Secondary | ICD-10-CM | POA: Diagnosis not present

## 2023-10-18 DIAGNOSIS — N184 Chronic kidney disease, stage 4 (severe): Secondary | ICD-10-CM | POA: Diagnosis not present

## 2023-10-18 DIAGNOSIS — I480 Paroxysmal atrial fibrillation: Secondary | ICD-10-CM | POA: Diagnosis not present

## 2023-10-18 DIAGNOSIS — F32A Depression, unspecified: Secondary | ICD-10-CM | POA: Diagnosis not present

## 2023-10-18 DIAGNOSIS — I130 Hypertensive heart and chronic kidney disease with heart failure and stage 1 through stage 4 chronic kidney disease, or unspecified chronic kidney disease: Secondary | ICD-10-CM | POA: Diagnosis not present

## 2023-10-20 ENCOUNTER — Encounter: Payer: Medicare HMO | Admitting: Nurse Practitioner

## 2023-10-20 NOTE — Progress Notes (Signed)
 This encounter was created in error - please disregard.

## 2023-10-23 ENCOUNTER — Ambulatory Visit: Admitting: Physician Assistant

## 2023-10-26 DIAGNOSIS — R051 Acute cough: Secondary | ICD-10-CM | POA: Diagnosis not present

## 2023-10-26 DIAGNOSIS — J40 Bronchitis, not specified as acute or chronic: Secondary | ICD-10-CM | POA: Diagnosis not present

## 2023-10-30 ENCOUNTER — Ambulatory Visit: Admitting: Emergency Medicine

## 2023-10-30 NOTE — Progress Notes (Deleted)
 Cardiology Office Note:    Date:  10/30/2023  ID:  Deanna [name], DOB [birth year], MRN [id number] PCP: Sharon Seller, NP  [facility] Providers Cardiologist:  Nanetta Batty, MD { Click to update primary MD,subspecialty MD or APP then REFRESH:1}    {Click to Open Review  :1}   Patient Profile:      Chief Complaint: *** History of Present Illness:  Deanna [name] is a 83 y.o. female with visit-pertinent history of HTN, CKD3, hypothyroidism, paroxysmal A-fib not on anticoagulation, diverticulosis, OSA on CPAP, anxiety, depression, thrombocytopenia, HLD, GERD, CAD s/p PTCA in LAD in 1994, CABG in 1999, PAD s/p prior L to R femorofemoral crossover, L common carotid to subclavian artery bypass, and CHF   She had an MI with PTCA to her left anterior descending in 1994, PTCA to LCx in 1995 and underwent CABG in 1999.  Due to her PAD she had left to right femorofemoral crossover and has also had left subclavian artery stenosis and left common carotid to subclavian artery bypass.   She presented to the Hospital on 03/2020 with chest pain and was found to have elevated troponins. Her last cardiac catheterization 2013 via her left common femoral artery below femorofemoral bypass anastomosis showed LIMA-LAD with occluded LT ICA, SVG to diagonal patent, SVG to OM patent, SVG to RCA 100% thrombotic.  There was a large radiopaque filling defect just prior to her occlusion site.  This was not safe PCI for fear of distal embolization.  A widely patent left common and external iliac with brisk flow L-R femorofemoral bypass graft was noted. EKG showed sinus tach at 103, flattened T waves, inferior lateral and on later EKGs increase in ST depression.  She underwent cardiac catheterization on 03/19/2020 which showed ostial LAD-proximal LAD lesion 99% LIMA-LAD known to be atrertic.  She underwent orbital arthrectomy and received DES x1.  She also has mid LAD 100% stenosis however, arthrectomy and angioplasty  could not restore flow.  Mid circumflex 90% stenosed SVG-OM patent.  Proximal RCA lesion 100% stenosed SVG-PDA occluded LVEF 25-35%.  Her echocardiogram 03/19/2020 showed an LVEF of 35-40% G2 DD, mildly reduced right ventricular function and mild mitral valve regurgitation.   Echocardiogram 05/2021 showed LVEF 50-55%, akinesis of the left ventricular, apical septal wall, inferior wall and inferolateral wall.  Hypokinesis of left ventricular mid-apical anteroseptal wall.  RV function and size normal, mild MR, moderate MAC, mild aortic valve sclerosis.  She was last seen in clinic on 12/09/2021 by Verdon Cummins, NP.  She was doing well at the time but no acute cardiovascular concerns or complaints.  Her medication regimen was continued and she was to follow-up in 6 to 9 months.  Patient presented to [facility] ED 04/23/2023 with dyspnea, insomnia, and chest pain concerning for ACS versus CHF exacerbation in setting of one-time missed dose of Lasix in the 48 hours prior to arrival. On admission, patient's chest x-ray demonstrated pulmonary congestion and elevated BNP level consistent with CHF exacerbation. TTE 9/17 showed EF 45%, G2DD, with global WMA, and Mod MR. Initially troponin bump thought likely d/t heart failure but given ongoing chest pain, underwent LHC which reflected stable CAD and no targets for intervention. Continued home aspirin, plavix, and crestor. LDL 82; added zetia. Given one dose of IV lasix with resolution of symptoms. Resumed home furosemide but patient became dehydrated, hypotensive and with AKI that resolved with IVF. Trialed losartan, but patient with borderline hypotension and AKI, so discontinued. Home metoprolol tartrate consolidated to  metoprolol succinate 25 mg daily. Dapagliflozin discontinued due to yeast infection that was treated with Monistat X 5 days.  Coronary angiography 9/18 performed at [facility] 1. Dominant RCA that is occluded but fills from left collaterals. The SVG to RCA is  occluded. 2. Occluded ostial circumflex but nicely patent SVG to a large OM.  3. The mid LAD is sub-totally occluded in the mid section with patent stent in the very proximal LAD. The LIMA to LAD is known to be atretic.  4. Patent SVG to a medium size diagonal that partially backfills the LAD.  5. Occluded right subclavian and occluded right iliac.  6. Today's study is unchanged from the report of her last cath with intervention from 2021.    She was recently seen on 10/09/2023 by Dr. Myra Gianotti of vascular surgery.  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  The plan was to proceed with right carotid endarterectomy.  Discussed the use of AI scribe software for clinical note transcription with the patient, who gave verbal consent to proceed.  History of Present Illness            Review of systems:  Please see the history of present illness. All other systems are reviewed and otherwise negative. ***     Home Medications:    No outpatient medications have been marked as taking for the 10/30/23 encounter (Appointment) with Denyce Robert, NP.   Studies Reviewed:       *** Risk Assessment/Calculations:   {Does this patient have ATRIAL FIBRILLATION?[phone]} No BP recorded.  {Refresh Note OR Click here to enter BP  :1}***       Physical Exam:   VS:  There were no vitals taken for this visit.   Wt Readings from Last 3 Encounters:  10/09/23 174 lb 1.6 oz (79 kg)  09/12/23 158 lb 3.2 oz (71.8 kg)  08/16/23 171 lb 1.2 oz (77.6 kg)    GEN: Well nourished, well developed in no acute distress NECK: No JVD; No carotid bruits CARDIAC: ***RRR, no murmurs, rubs, gallops RESPIRATORY:  Clear to auscultation without rales, wheezing or rhonchi  ABDOMEN: Soft, non-tender, non-distended EXTREMITIES:  No edema; No acute deformity ***     Assessment and Plan:  Assessment and Plan              {Are you ordering a CV Procedure (e.g. stress test, cath,  DCCV, TEE, etc)?   Press F2        :[id number]}  Dispo:  No follow-ups on file.  Signed, Denyce Robert, NP

## 2023-11-03 DIAGNOSIS — F0283 Dementia in other diseases classified elsewhere, unspecified severity, with mood disturbance: Secondary | ICD-10-CM | POA: Diagnosis not present

## 2023-11-03 DIAGNOSIS — F411 Generalized anxiety disorder: Secondary | ICD-10-CM | POA: Diagnosis not present

## 2023-11-03 DIAGNOSIS — E039 Hypothyroidism, unspecified: Secondary | ICD-10-CM | POA: Diagnosis not present

## 2023-11-03 DIAGNOSIS — I480 Paroxysmal atrial fibrillation: Secondary | ICD-10-CM | POA: Diagnosis not present

## 2023-11-03 DIAGNOSIS — N184 Chronic kidney disease, stage 4 (severe): Secondary | ICD-10-CM | POA: Diagnosis not present

## 2023-11-03 DIAGNOSIS — I5042 Chronic combined systolic (congestive) and diastolic (congestive) heart failure: Secondary | ICD-10-CM | POA: Diagnosis not present

## 2023-11-03 DIAGNOSIS — F32A Depression, unspecified: Secondary | ICD-10-CM | POA: Diagnosis not present

## 2023-11-03 DIAGNOSIS — I130 Hypertensive heart and chronic kidney disease with heart failure and stage 1 through stage 4 chronic kidney disease, or unspecified chronic kidney disease: Secondary | ICD-10-CM | POA: Diagnosis not present

## 2023-11-03 DIAGNOSIS — F0284 Dementia in other diseases classified elsewhere, unspecified severity, with anxiety: Secondary | ICD-10-CM | POA: Diagnosis not present

## 2023-11-05 NOTE — Progress Notes (Unsigned)
 Cardiology Office Note    Date:  11/06/2023  ID:  Deanna, [name] 10-17-1940, MRN [id number] PCP:  Sharon Seller, NP  Cardiologist:  Nanetta Batty, MD  Electrophysiologist:  None   Chief Complaint: Preoperative cardiac evaluation   History of Present Illness: .    Deanna [name] is a 83 y.o. female with visit-pertinent history of CAD (MI with PTCA to LAD in 1994, PTCA to Lcx in 1995, CABG in 1999 with known graft closure 2/4 and subsequent PCI/DES to pLAD in 03/2020), chronic combined CHF/ICM, paroxysmal atrial fibrillation, PAD with L-R femorofemoral crossover, left subclavian artery stenosis with left common carotid to subclavian artery bypass, celiac artery stenosis, cognitive impairment, TBI, depression, anxiety, hearing loss, GERD, hypertension, hypothyroidism, hyperlipidemia, OSA, incontinence and CKD stage IV.   She had an MI with PTCA to her left anterior descending in 1994, PTCA to LCx in 1995 and underwent CABG in 1999.  Due to her PAD she had left to right femorofemoral crossover and has also had left subclavian artery stenosis and left common carotid to subclavian artery bypass.   Her cardiac catheterization in 2013 via her left common femoral artery below femorofemoral bypass anastomosis showed LIMA-LAD with occluded LT ICA, SVG to diagonal patent, SVG to OM patent, SVG to RCA 100% thrombotic.  There was a large radiopaque filling defect just prior to her occlusion site.  Felt not to be safe for PCI for fear of distal embolization.  A widely patent left common and external iliac with brisk flow L-R femorofemoral bypass graft was noted.   She underwent cardiac catheterization on 03/19/2020 which showed ostial LAD-proximal LAD lesion 99% LIMA-LAD known to be atrertic.  She underwent orbital arthrectomy and received DES x1.  She also has mid LAD 100% stenosis however, arthrectomy and angioplasty could not restore flow.  Mid circumflex 90% stenosed SVG-OM patent.  Proximal RCA  lesion 100% stenosed SVG-PDA occluded LVEF 25-35%.  Her echocardiogram 03/19/2020 showed an LVEF of 35-40% G2 DD, mildly reduced right ventricular function and mild mitral valve regurgitation.  Her hospital course was complicated by a brief episode of atrial fibrillation following heart catheterization, converted to normal sinus rhythm with amiodarone.  Amiodarone was subsequently stopped and patient did not have any recurrent atrial fibrillation.  Given single and brief episode anticoagulation was not initiated.  Echocardiogram 05/2021 showed LVEF 50-55%, akinesis of the left ventricular, apical septal wall, inferior wall and inferolateral wall.  Hypokinesis of left ventricular mid-apical anteroseptal wall.  RV function and size normal, mild MR, moderate MAC, mild aortic valve sclerosis.  She was last seen in clinic on 12/09/2021 by Edd Fabian, NP.  She was doing well at the time but no acute cardiovascular concerns or complaints.  Her medication regimen was continued and she was to follow-up in 6 to 9 months.  Patient presented to [facility] ED 04/23/2023 with dyspnea, insomnia, and chest pain concerning for ACS versus CHF exacerbation in setting of one-time missed dose of Lasix in the 48 hours prior to arrival. On admission, patient's chest x-ray demonstrated pulmonary congestion and elevated BNP level consistent with CHF exacerbation. TTE 9/17 showed EF 45%, G2DD, with global WMA, and Mod MR. Initially troponin bump thought likely d/t heart failure but given ongoing chest pain, underwent LHC which reflected stable CAD and no targets for intervention. Continued home aspirin, plavix, and crestor. LDL 82; added zetia. Given one dose of IV lasix with resolution of symptoms. Resumed home furosemide but patient became dehydrated, hypotensive  and with AKI that resolved with IVF. Trialed losartan, but patient with borderline hypotension and AKI, discontinued. Home metoprolol tartrate consolidated to metoprolol succinate  25 mg daily. Dapagliflozin discontinued due to yeast infection that was treated with Monistat X 5 days.  Coronary angiography 04/26/23 performed at [facility] 1. Dominant RCA that is occluded but fills from left collaterals. The SVG to RCA is occluded. 2. Occluded ostial circumflex but nicely patent SVG to a large OM.  3. The mid LAD is sub-totally occluded in the mid section with patent stent in the very proximal LAD. The LIMA to LAD is known to be atretic.  4. Patent SVG to a medium size diagonal that partially backfills the LAD.  5. Occluded right subclavian and occluded right iliac.  6. Today's study is unchanged from the report of her last cath with intervention from 2021.    She was recently seen on 10/09/2023 by Dr. Myra Gianotti of vascular surgery.  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 Plans to proceed with right carotid endarterectomy.  Today patient presents regarding preoperative cardiac evaluation for a right carotid endarterectomy with Dr. Myra Gianotti.  Patient presents with her daughter who assists with providing history.  Patient and daughter report that in recent months she is overall doing well, she denies any chest pain, shortness of breath, orthopnea or PND.  Her daughter notes that she will occasionally have increased lower extremity edema that is improved with Lasix.  Patient's daughter reports that her blood pressure has been extremely well-controlled, on occasion has been soft.  They note that patient is no longer very active, is able to walk from her room to the bathroom.   ROS: .   Today she denies chest pain, shortness of breath, lower extremity edema, fatigue, palpitations, melena, hematuria, hemoptysis, diaphoresis, weakness, presyncope, syncope, orthopnea, and PND.  All other systems are reviewed and otherwise negative. Studies Reviewed: Marland Kitchen    EKG:  EKG is ordered today, personally reviewed, demonstrating  EKG Interpretation Date/Time:  Monday  November 06 2023 14:12:24 EDT Ventricular Rate:  81 PR Interval:  150 QRS Duration:  86 QT Interval:  394 QTC Calculation: 457 R Axis:   -3  Text Interpretation: Normal sinus rhythm Left ventricular hypertrophy with repolarization abnormality ( R in aVL , Cornell product , Romhilt-Estes ) Confirmed by Reather Littler [phone]) on 11/06/2023 8:41:00 PM   CV Studies: Cardiac studies reviewed are outlined and summarized above. Otherwise please see EMR for full report. Cardiac Studies & Procedures   ______________________________________________________________________________________________ CARDIAC CATHETERIZATION  CARDIAC CATHETERIZATION 03/19/2020  Narrative  Ost LAD to Prox LAD lesion is 99% stenosed. LIMA to LAD known to be atretic.  After orbital atherectomy, A drug-eluting stent was successfully placed using a STENT RESOLUTE ONYX 3.5X15.  Post intervention, there is a 0% residual stenosis.  Mid LAD lesion is 100% stenosed. Despite atherectomy and angioplasty, flow could not be restored.  Mid Cx lesion is 100% stenosed. SVG to OM patent.  Prox RCA lesion is 100% stenosed. SVG to PDA occluded.  There is moderate left ventricular systolic dysfunction.  LV end diastolic pressure is moderately elevated.  The left ventricular ejection fraction is 25-35% by visual estimate.  There is no aortic valve stenosis.  Balloon angioplasty was performed using a BALLOON SAPPHIRE 2.0X12.  Complex intervention due to severe three-vessel coronary artery disease with prior bypass surgery.  Successful atherectomy and stenting of the proximal LAD.  Unable to restore flow through the mid LAD.  I suspect, she is a somewhat late presenting infarct.  She already has evidence of heart failure with elevated LVEDP.  Will need ongoing medical therapy to help her LV dysfunction.  Findings Coronary Findings Diagnostic  Dominance: Right  Left Anterior Descending Ost LAD to Prox LAD lesion is 99% stenosed. The  lesion is calcified. Mid LAD lesion is 100% stenosed.  Left Circumflex Mid Cx lesion is 100% stenosed.  Right Coronary Artery Prox RCA lesion is 100% stenosed.  Right Posterior Descending Artery Collaterals RPDA filled by collaterals from 2nd Sept.  Right Posterior Atrioventricular Artery Collaterals RPAV filled by collaterals from 3rd Mrg.  Graft To 2nd Diag  Graft To 3rd Mrg  Graft To RPDA Origin lesion is 100% stenosed.  Intervention  Ost LAD to Prox LAD lesion Atherectomy CATH LAUNCHER 6FR EBU 3.75 guide catheter was inserted. WIRE VIPERWIRE COR FLEX TIP guidewire was used to cross lesion. Orbital atherectomy was performed using a CROWN DIAMONDBACK CLASSIC 1.25. Initially, 1.0 balloon was used before orbital atherectomy. Stent CATH LAUNCHER 6FR EBU 3.75 guide catheter was inserted. Lesion crossed with guidewire using a WIRE ASAHI PROWATER 180CM. Pre-stent angioplasty was performed using a BALLOON SAPPHIRE 3.0X15. A drug-eluting stent was successfully placed using a STENT RESOLUTE ONYX 3.5X15. Stent strut is well apposed. Post-stent angioplasty was performed using a BALLOON SAPPHIRE [place] [id number]. Post-Intervention Lesion Assessment The intervention was successful. Pre-interventional TIMI flow is 3. Post-intervention TIMI flow is 3. No complications occurred at this lesion. There is a 0% residual stenosis post intervention.  Mid LAD lesion Atherectomy CATH LAUNCHER 6FR EBU 3.75 guide catheter was inserted. WIRE FIGHTER CROSSING guidewire was used to cross lesion. Orbital atherectomy was performed using a CROWN DIAMONDBACK CLASSIC 1.25. Flex tip wire was used for the actual atherectomy. Angioplasty CATH LAUNCHER 6FR EBU 3.75 guide catheter was inserted. WIRE FIGHTER CROSSING guidewire used to cross lesion. Balloon angioplasty was performed using a BALLOON SAPPHIRE 2.0X12. Post-Intervention Lesion Assessment The intervention was unsuccessful. Pre-interventional TIMI flow is 0.  Post-intervention TIMI flow is 0. No complications occurred at this lesion. There is a 100% residual stenosis post intervention.   CARDIAC CATHETERIZATION  CARDIAC CATHETERIZATION 09/11/2012   STRESS TESTS  NM MYOCAR MULTI W/SPECT W 11/16/2017  Narrative  There was no ST segment deviation noted during stress.  No T wave inversion was noted during stress.  Defect 1: There is a small defect of moderate severity present in the apex location.  This is a low risk study.  The left ventricular ejection fraction is normal (55-65%).  Nuclear stress EF: 62%.  Mildly abnormal, low risk stress nuclear study with apical thinning; cannot R/O mild apical ischemia; EF 62 with normal wall motion.   ECHOCARDIOGRAM  ECHOCARDIOGRAM COMPLETE 05/09/2021  Narrative ECHOCARDIOGRAM REPORT    Patient Name:   DOREE KUEHNE Date of Exam: 05/09/2021 Medical Rec #:  [id number]       Height:       61.0 in Accession #:    [id number]      Weight:       185.0 lb Date of Birth:  [birth year]       BSA:          1.827 m Patient Age:    80 years        BP:           118/60 mmHg Patient Gender: F               HR:  82 bpm. Exam Location:  Inpatient  Procedure: 2D Echo, Cardiac Doppler, Color Doppler and Intracardiac Opacification Agent  Indications:    NSTEMI I21.4 CHF-Acute Systolic I50.21  History:        Patient has prior history of Echocardiogram examinations, most recent 03/19/2020. CHF, CAD and Previous Myocardial Infarction, PAD; Risk Factors:Sleep Apnea. Thyroid disease, GERD.  Sonographer:    Leta Jungling RDCS Referring Phys: [id number] VASUNDHRA RATHORE  IMPRESSIONS   1. Left ventricular ejection fraction, by estimation, is 50 to 55%. The left ventricle has low normal function. The left ventricle demonstrates regional wall motion abnormalities (see scoring diagram/findings for description). There is mild concentric left ventricular hypertrophy. Left ventricular diastolic  parameters are indeterminate. Elevated left ventricular end-diastolic pressure. There is akinesis of the left ventricular, apical septal wall, inferior wall and inferolateral wall. There is akinesis of the left ventricular, entire apical segment. There is hypokinesis of the left ventricular, mid-apical anteroseptal wall. 2. Right ventricular systolic function is normal. The right ventricular size is normal. 3. The mitral valve is normal in structure. Mild mitral valve regurgitation. No evidence of mitral stenosis. Moderate mitral annular calcification. 4. The aortic valve is tricuspid. Aortic valve regurgitation is not visualized. Mild aortic valve sclerosis is present, with no evidence of aortic valve stenosis. 5. The inferior vena cava is normal in size with greater than 50% respiratory variability, suggesting right atrial pressure of 3 mmHg.  FINDINGS Left Ventricle: Left ventricular ejection fraction, by estimation, is 50 to 55%. The left ventricle has low normal function. The left ventricle demonstrates regional wall motion abnormalities. The left ventricular internal cavity size was normal in size. There is mild concentric left ventricular hypertrophy. Left ventricular diastolic parameters are indeterminate. Elevated left ventricular end-diastolic pressure.  Right Ventricle: The right ventricular size is normal. No increase in right ventricular wall thickness. Right ventricular systolic function is normal.  Left Atrium: Left atrial size was normal in size.  Right Atrium: Right atrial size was normal in size.  Pericardium: There is no evidence of pericardial effusion.  Mitral Valve: The mitral valve is normal in structure. Moderate mitral annular calcification. Mild mitral valve regurgitation. No evidence of mitral valve stenosis.  Tricuspid Valve: The tricuspid valve is normal in structure. Tricuspid valve regurgitation is mild . No evidence of tricuspid stenosis.  Aortic Valve: The  aortic valve is tricuspid. Aortic valve regurgitation is not visualized. Mild aortic valve sclerosis is present, with no evidence of aortic valve stenosis.  Pulmonic Valve: The pulmonic valve was normal in structure. Pulmonic valve regurgitation is trivial. No evidence of pulmonic stenosis.  Aorta: The aortic root is normal in size and structure.  Venous: The inferior vena cava is normal in size with greater than 50% respiratory variability, suggesting right atrial pressure of 3 mmHg.  IAS/Shunts: No atrial level shunt detected by color flow Doppler.   LEFT VENTRICLE PLAX 2D LVIDd:         4.30 cm     Diastology LVIDs:         2.60 cm     LV e' medial:    6.15 cm/s LV PW:         1.10 cm     LV E/e' medial:  18.7 LV IVS:        1.20 cm     LV e' lateral:   8.03 cm/s LVOT diam:     1.70 cm     LV E/e' lateral: 14.3 LV SV:  40 LV SV Index:   22 LVOT Area:     2.27 cm  LV Volumes (MOD) LV vol d, MOD A2C: 71.7 ml LV vol d, MOD A4C: 80.0 ml LV vol s, MOD A2C: 39.1 ml LV vol s, MOD A4C: 35.6 ml LV SV MOD A2C:     32.6 ml LV SV MOD A4C:     80.0 ml LV SV MOD BP:      38.5 ml  RIGHT VENTRICLE RV S prime:     8.55 cm/s TAPSE (M-mode): 1.3 cm  LEFT ATRIUM             Index       RIGHT ATRIUM           Index LA diam:        4.10 cm 2.24 cm/m  RA Area:     10.70 cm LA Vol (A2C):   19.9 ml 10.89 ml/m RA Volume:   18.10 ml  9.91 ml/m LA Vol (A4C):   35.7 ml 19.54 ml/m LA Biplane Vol: 27.5 ml 15.05 ml/m AORTIC VALVE LVOT Vmax:   65.50 cm/s LVOT Vmean:  47.400 cm/s LVOT VTI:    0.176 m  AORTA Ao Root diam: 2.70 cm  MITRAL VALVE MV Area (PHT): 4.36 cm     SHUNTS MV Decel Time: 174 msec     Systemic VTI:  0.18 m MV E velocity: 115.00 cm/s  Systemic Diam: 1.70 cm MV A velocity: 121.00 cm/s MV E/A ratio:  0.95  Armanda Magic MD Electronically signed by Armanda Magic MD Signature Date/Time: 05/09/2021/1:08:10 PM    Final           ______________________________________________________________________________________________       Current Reported Medications:.    Current Meds  Medication Sig   albuterol (VENTOLIN HFA) 108 (90 Base) MCG/ACT inhaler Inhale 2 puffs into the lungs every 6 (six) hours as needed for wheezing or shortness of breath.   ALPRAZolam (XANAX) 0.5 MG tablet Take 1 tablet (0.5 mg total) by mouth daily as needed for up to 2 doses for anxiety.   aspirin EC 81 MG tablet Take 81 mg by mouth at bedtime.   atorvastatin (LIPITOR) 80 MG tablet Take 80 mg by mouth at bedtime.   busPIRone (BUSPAR) 15 MG tablet Take 1 tablet (15 mg total) by mouth 2 (two) times daily.   Cholecalciferol (VITAMIN D3) 125 MCG (5000 UT) CAPS Take 5,000 Units by mouth at bedtime.   clopidogrel (PLAVIX) 75 MG tablet TAKE 1 TABLET(75 MG) BY MOUTH DAILY (Patient taking differently: Take 75 mg by mouth at bedtime.)   Cyanocobalamin (VITAMIN B-12 PO) Take 1 tablet by mouth in the morning.   divalproex (DEPAKOTE ER) 250 MG 24 hr tablet Take 1 tablet (250 mg total) by mouth at bedtime.   dorzolamide (TRUSOPT) 2 % ophthalmic solution Place 1 drop into both eyes in the morning and at bedtime.   doxycycline (VIBRAMYCIN) 100 MG capsule Take 100 mg by mouth 2 (two) times daily.   escitalopram (LEXAPRO) 20 MG tablet Take 1 tablet (20 mg total) by mouth daily.   ezetimibe (ZETIA) 10 MG tablet Take 10 mg by mouth daily.   furosemide (LASIX) 40 MG tablet TAKE 1 TABLET(40 MG) BY MOUTH DAILY (Patient taking differently: Take 40 mg by mouth every other day.)   gabapentin (NEURONTIN) 100 MG capsule TAKE 2 CAPSULES(200 MG) BY MOUTH DAILY (Patient taking differently: Take 300 mg by mouth in the morning.)   ketoconazole (NIZORAL) 2 %  shampoo APPLY TOPICALLY 2 TIMES A WEEK. (Patient taking differently: Apply 10 mLs topically 2 (two) times a week.)   levothyroxine (SYNTHROID) 175 MCG tablet TAKE 1 TABLET BY MOUTH EVERY DAY AT THE SAME TIME AND  SEPARATE FROM ALL OTHER MEDICATIONS   meclizine (ANTIVERT) 25 MG tablet Take 1 tablet (25 mg total) by mouth as needed for dizziness. (Patient taking differently: Take 25 mg by mouth 3 (three) times daily as needed for dizziness.)   methylPREDNISolone (MEDROL DOSEPAK) 4 MG TBPK tablet Take by mouth as directed.   metoprolol succinate (TOPROL-XL) 25 MG 24 hr tablet Take 25 mg by mouth daily.   nitroGLYCERIN (NITROSTAT) 0.4 MG SL tablet Place 1 tablet (0.4 mg total) under the tongue every 5 (five) minutes x 3 doses as needed for chest pain.   pantoprazole (PROTONIX) 40 MG tablet Take 1 tablet (40 mg total) by mouth daily.   rosuvastatin (CRESTOR) 40 MG tablet TAKE 1 TABLET(40 MG) BY MOUTH DAILY (Patient taking differently: Take 40 mg by mouth at bedtime.)   TYLENOL 500 MG tablet Take 1,000 mg by mouth in the morning.    Physical Exam:    VS:  BP 114/70   Pulse 77   Ht 5\' 7"  (1.702 m)   Wt 170 lb (77.1 kg)   SpO2 96%   BMI 26.63 kg/m    Wt Readings from Last 3 Encounters:  11/06/23 170 lb (77.1 kg)  10/09/23 174 lb 1.6 oz (79 kg)  09/12/23 158 lb 3.2 oz (71.8 kg)    GEN: Well nourished, well developed in no acute distress NECK: No JVD; No carotid bruits CARDIAC: RRR, no murmurs, rubs, gallops RESPIRATORY:  Clear to auscultation without rales, wheezing or rhonchi  ABDOMEN: Soft, non-tender, non-distended EXTREMITIES:  No edema; No acute deformity     Asessement and Plan:.    Preoperative cardiac evaluation: Patient is currently pending right carotid endarterectomy with Dr. Myra Gianotti.  Patient underwent cardiac catheterization on 04/26/2023 at [facility] after presenting with chest pain, cardiac catheterization was unchanged from last cath with intervention in 2021. Echo indicated EF 45%.  Patient denies any anginal symptoms and is euvolemic and well compensated on exam today.  Patient is unable to meet 4 METS of activity.  Discussed with Dr. Allyson Sabal, her primary cardiologist, patient is  at least at moderate risk for procedure however given stable coronary artery disease noted on cardiac catheterization in 04/2023 would not pursue further cardiac workup prior to procedure.  Patient may proceed with procedure at moderate cardiac risk.  Per Dr. Allyson Sabal patient may hold Plavix for 5 days prior to procedure and aspirin 7 days prior to procedure.  Please resume aspirin and Plavix when safe to do so from a bleeding standpoint.  CAD: Patient with history of CABG.  Cardiac catheterization 03/2020, received arthrectomy and DES x 1 to proximal LAD.  Atherectomy was also attempted in her mid LAD however flow could not be restored.  Patient underwent LHC at [facility] in 04/2023, noted to have stable CAD, no significant changes compared to cardiac catheterization in 2021.  Patient today denies any chest pain, shortness of breath.  She reports that she has been doing very well.  Continue aspirin 81 mg daily, Lipitor 80 mg daily, Plavix 75 mg daily, metoprolol succinate 25 mg daily, sublingual nitroglycerin as needed.  HFrEF/ischemic cardiomyopathy:  Echo in 03/2020 indicated LVEF of 35 to 40%, grade 2 diastolic dysfunction. Echo in 05/2021 indicated LVEF of 50 to 55%, wall motion  abnormalities were present, mild concentric LVH, diastolic parameters were indeterminate.  Echo on 04/25/2023 indicated LVEF of 45%, moderate LVH, There was moderate MR, trivial PR, trivial TR, no valvular stenosis.  Today patient appears euvolemic and well compensated on exam.  Patient's daughter notes that she does not take Lasix daily, takes as needed for increased lower extremity edema, shortness of breath.  Denies any recent weight gain.  Encouraged to monitor for weight gain of 2 to 3 pounds overnight or 5 pounds in a week, increased shortness of breath or lower extremity edema.  Patient's Marcelline Deist was discontinued given recurrent yeast infections, losartan discontinued previously due to hypotension.  Continue metoprolol succinate 25 mg  daily.  HLD: Last LDL 40 on 06/16/2023.  Paroxysmal atrial fibrillation: Patient with history of brief episode of atrial fibrillation noted following cardiac catheterization, patient converted to normal sinus rhythm with amiodarone.  Amiodarone was subsequently stopped and she did not have any recurrent atrial fibrillation.  Anticoagulation was not initiated.  Patient denies any palpitations or feeling of increased heart rates.  Continue metoprolol.    Disposition: F/u with Dr. Allyson Sabal in three months.   Signed, Rip Harbour, NP

## 2023-11-06 ENCOUNTER — Ambulatory Visit: Attending: Cardiology | Admitting: Cardiology

## 2023-11-06 ENCOUNTER — Encounter: Payer: Self-pay | Admitting: Cardiology

## 2023-11-06 VITALS — BP 114/70 | HR 77 | Ht 67.0 in | Wt 170.0 lb

## 2023-11-06 DIAGNOSIS — Z01810 Encounter for preprocedural cardiovascular examination: Secondary | ICD-10-CM

## 2023-11-06 DIAGNOSIS — I5020 Unspecified systolic (congestive) heart failure: Secondary | ICD-10-CM

## 2023-11-06 DIAGNOSIS — E782 Mixed hyperlipidemia: Secondary | ICD-10-CM

## 2023-11-06 DIAGNOSIS — I25810 Atherosclerosis of coronary artery bypass graft(s) without angina pectoris: Secondary | ICD-10-CM

## 2023-11-06 DIAGNOSIS — I6529 Occlusion and stenosis of unspecified carotid artery: Secondary | ICD-10-CM | POA: Diagnosis not present

## 2023-11-06 DIAGNOSIS — I480 Paroxysmal atrial fibrillation: Secondary | ICD-10-CM | POA: Diagnosis not present

## 2023-11-06 NOTE — Patient Instructions (Signed)
 Medication Instructions:  No Changes  *If you need a refill on your cardiac medications before your next appointment, please call your pharmacy*  Lab Work: No Labs If you have labs (blood work) drawn today and your tests are completely normal, you will receive your results only by: MyChart Message (if you have MyChart) OR A paper copy in the mail If you have any lab test that is abnormal or we need to change your treatment, we will call you to review the results.  Testing/Procedures: No Testing  Follow-Up: At [facility], you and your health needs are our priority.  As part of our continuing mission to provide you with exceptional heart care, our providers are all part of one team.  This team includes your primary Cardiologist (physician) and Advanced Practice Providers or APPs (Physician Assistants and Nurse Practitioners) who all work together to provide you with the care you need, when you need it.  Your next appointment:   3 month(s)  Provider:   Nanetta Batty, MD   We recommend signing up for the patient portal called "MyChart".  Sign up information is provided on this After Visit Summary.  MyChart is used to connect with patients for Virtual Visits (Telemedicine).  Patients are able to view lab/test results, encounter notes, upcoming appointments, etc.  Non-urgent messages can be sent to your provider as well.   To learn more about what you can do with MyChart, go to [email or web address].   Other Instructions       1st Floor: - Lobby - Registration  - Pharmacy  - Lab - Cafe  2nd Floor: - PV Lab - Diagnostic Testing (echo, CT, nuclear med)  3rd Floor: - Vacant  4th Floor: - TCTS (cardiothoracic surgery) - AFib Clinic - Structural Heart Clinic - Vascular Surgery  - Vascular Ultrasound  5th Floor: - [facility] (general and EP) - Clinical Pharmacy for coumadin, hypertension, lipid, weight-loss medications, and med management  appointments    Valet parking services will be available as well.

## 2023-11-08 ENCOUNTER — Other Ambulatory Visit: Payer: Self-pay | Admitting: Nurse Practitioner

## 2023-11-09 ENCOUNTER — Other Ambulatory Visit: Payer: Self-pay

## 2023-11-09 DIAGNOSIS — I6523 Occlusion and stenosis of bilateral carotid arteries: Secondary | ICD-10-CM

## 2023-11-27 NOTE — Pre-Procedure Instructions (Signed)
 Surgical Instructions   Your procedure is scheduled on Wednesday, April 23rd. Report to [facility] Main Entrance "A" at 07:45 A.M., then check in with the Admitting office. Any questions or running late day of surgery: call [phone]  Questions prior to your surgery date: call [phone], Monday-Friday, 8am-4pm. If you experience any cold or flu symptoms such as cough, fever, chills, shortness of breath, etc. between now and your scheduled surgery, please notify us  at the above number.     Remember:  Do not eat after midnight the night before your surgery   You may drink clear liquids until 06:45 AM the morning of your surgery.   Clear liquids allowed are: Water, Non-Citrus Juices (without pulp), Carbonated Beverages, Clear Tea (no milk, honey, etc.), Black Coffee Only (NO MILK, CREAM OR POWDERED CREAMER of any kind), and Gatorade.    Take these medicines the morning of surgery with A SIP OF WATER  busPIRone  (BUSPAR )  dorzolamide  (TRUSOPT ) eye drops doxycycline  (VIBRAMYCIN )  escitalopram  (LEXAPRO )  ezetimibe  (ZETIA )  gabapentin  (NEURONTIN )  levothyroxine  (SYNTHROID )  metoprolol  succinate (TOPROL -XL)  pantoprazole  (PROTONIX )  TYLENOL    May take these medicines IF NEEDED: albuterol  (VENTOLIN  HFA)- bring inhaler with you on day of surgery ALPRAZolam  (XANAX )  meclizine  (ANTIVERT )  nitroGLYCERIN  (NITROSTAT )- If you have to take this medication prior to surgery, please call [phone] and report this to a nurse   STOP taking Plavix  5 days prior to surgery. Last dose 4/17. STOP taking Aspirin  7 days prior to surgery. Last dose 4/15.  One week prior to surgery, STOP taking any Aleve, Naproxen, Ibuprofen, Motrin, Advil, Goody's, BC's, all herbal medications, fish oil, and non-prescription vitamins.                     Do NOT Smoke (Tobacco/Vaping) for 24 hours prior to your procedure.  If you use a CPAP at night, you may bring your mask/headgear for your overnight stay.    You will be asked to remove any contacts, glasses, piercing's, hearing aid's, dentures/partials prior to surgery. Please bring cases for these items if needed.    Patients discharged the day of surgery will not be allowed to drive home, and someone needs to stay with them for 24 hours.  SURGICAL WAITING ROOM VISITATION Patients may have no more than 2 support people in the waiting area - these visitors may rotate.   Pre-op nurse will coordinate an appropriate time for 1 ADULT support person, who may not rotate, to accompany patient in pre-op.  Children under the age of 22 must have an adult with them who is not the patient and must remain in the main waiting area with an adult.  If the patient needs to stay at the hospital during part of their recovery, the visitor guidelines for inpatient rooms apply.  Please refer to the [facility] website for the visitor guidelines for any additional information.   If you received a COVID test during your pre-op visit  it is requested that you wear a mask when out in public, stay away from anyone that may not be feeling well and notify your surgeon if you develop symptoms. If you have been in contact with anyone that has tested positive in the last 10 days please notify you surgeon.      Pre-operative CHG Bathing Instructions   You can play a key role in reducing the risk of infection after surgery. Your skin needs to be as free of germs as possible. You can  reduce the number of germs on your skin by washing with CHG (chlorhexidine  gluconate) soap before surgery. CHG is an antiseptic soap that kills germs and continues to kill germs even after washing.   DO NOT use if you have an allergy to chlorhexidine /CHG or antibacterial soaps. If your skin becomes reddened or irritated, stop using the CHG and notify one of our RNs at [phone].              TAKE A SHOWER THE NIGHT BEFORE SURGERY AND THE DAY OF SURGERY    Please keep in mind the following:   DO NOT shave, including legs and underarms, 48 hours prior to surgery.   You may shave your face before/day of surgery.  Place clean sheets on your bed the night before surgery Use a clean washcloth (not used since being washed) for each shower. DO NOT sleep with pet's night before surgery.  CHG Shower Instructions:  Wash your face and private area with normal soap. If you choose to wash your hair, wash first with your normal shampoo.  After you use shampoo/soap, rinse your hair and body thoroughly to remove shampoo/soap residue.  Turn the water OFF and apply half the bottle of CHG soap to a CLEAN washcloth.  Apply CHG soap ONLY FROM YOUR NECK DOWN TO YOUR TOES (washing for 3-5 minutes)  DO NOT use CHG soap on face, private areas, open wounds, or sores.  Pay special attention to the area where your surgery is being performed.  If you are having back surgery, having someone wash your back for you may be helpful. Wait 2 minutes after CHG soap is applied, then you may rinse off the CHG soap.  Pat dry with a clean towel  Put on clean pajamas    Additional instructions for the day of surgery: DO NOT APPLY any lotions, deodorants, cologne, or perfumes.   Do not wear jewelry or makeup Do not wear nail polish, gel polish, artificial nails, or any other type of covering on natural nails (fingers and toes) Do not bring valuables to the hospital. [facility] is not responsible for valuables/personal belongings. Put on clean/comfortable clothes.  Please brush your teeth.  Ask your nurse before applying any prescription medications to the skin.

## 2023-11-28 ENCOUNTER — Encounter (HOSPITAL_COMMUNITY): Payer: Self-pay

## 2023-11-28 ENCOUNTER — Other Ambulatory Visit: Payer: Self-pay

## 2023-11-28 ENCOUNTER — Encounter (HOSPITAL_COMMUNITY)
Admission: RE | Admit: 2023-11-28 | Discharge: 2023-11-28 | Disposition: A | Discharge status: Home / Self Care | Source: Ambulatory Visit | Attending: Surgery | Admitting: Surgery

## 2023-11-28 VITALS — BP 137/65 | HR 91 | Resp 17 | Ht 61.0 in | Wt 178.0 lb

## 2023-11-28 DIAGNOSIS — I252 Old myocardial infarction: Secondary | ICD-10-CM | POA: Insufficient documentation

## 2023-11-28 DIAGNOSIS — N184 Chronic kidney disease, stage 4 (severe): Secondary | ICD-10-CM | POA: Insufficient documentation

## 2023-11-28 DIAGNOSIS — Z955 Presence of coronary angioplasty implant and graft: Secondary | ICD-10-CM | POA: Diagnosis not present

## 2023-11-28 DIAGNOSIS — I517 Cardiomegaly: Secondary | ICD-10-CM | POA: Diagnosis not present

## 2023-11-28 DIAGNOSIS — Z87820 Personal history of traumatic brain injury: Secondary | ICD-10-CM | POA: Diagnosis not present

## 2023-11-28 DIAGNOSIS — E039 Hypothyroidism, unspecified: Secondary | ICD-10-CM | POA: Insufficient documentation

## 2023-11-28 DIAGNOSIS — H409 Unspecified glaucoma: Secondary | ICD-10-CM | POA: Insufficient documentation

## 2023-11-28 DIAGNOSIS — Z01818 Encounter for other preprocedural examination: Secondary | ICD-10-CM

## 2023-11-28 DIAGNOSIS — G4733 Obstructive sleep apnea (adult) (pediatric): Secondary | ICD-10-CM | POA: Diagnosis not present

## 2023-11-28 DIAGNOSIS — K5730 Diverticulosis of large intestine without perforation or abscess without bleeding: Secondary | ICD-10-CM | POA: Insufficient documentation

## 2023-11-28 DIAGNOSIS — Z01812 Encounter for preprocedural laboratory examination: Secondary | ICD-10-CM | POA: Insufficient documentation

## 2023-11-28 DIAGNOSIS — I480 Paroxysmal atrial fibrillation: Secondary | ICD-10-CM | POA: Diagnosis not present

## 2023-11-28 DIAGNOSIS — I129 Hypertensive chronic kidney disease with stage 1 through stage 4 chronic kidney disease, or unspecified chronic kidney disease: Secondary | ICD-10-CM | POA: Diagnosis not present

## 2023-11-28 DIAGNOSIS — I2510 Atherosclerotic heart disease of native coronary artery without angina pectoris: Secondary | ICD-10-CM | POA: Insufficient documentation

## 2023-11-28 DIAGNOSIS — I6523 Occlusion and stenosis of bilateral carotid arteries: Secondary | ICD-10-CM | POA: Diagnosis not present

## 2023-11-28 DIAGNOSIS — K219 Gastro-esophageal reflux disease without esophagitis: Secondary | ICD-10-CM | POA: Diagnosis not present

## 2023-11-28 DIAGNOSIS — Z951 Presence of aortocoronary bypass graft: Secondary | ICD-10-CM | POA: Diagnosis not present

## 2023-11-28 DIAGNOSIS — Z96652 Presence of left artificial knee joint: Secondary | ICD-10-CM | POA: Insufficient documentation

## 2023-11-28 LAB — CBC
HCT: 42 % (ref 36.0–46.0)
Hemoglobin: 13.6 g/dL (ref 12.0–15.0)
MCH: 28.5 pg (ref 26.0–34.0)
MCHC: 32.4 g/dL (ref 30.0–36.0)
MCV: 87.9 fL (ref 80.0–100.0)
Platelets: 209 10*3/uL (ref 150–400)
RBC: 4.78 MIL/uL (ref 3.87–5.11)
RDW: 13.2 % (ref 11.5–15.5)
WBC: 6.4 10*3/uL (ref 4.0–10.5)
nRBC: 0 % (ref 0.0–0.2)

## 2023-11-28 LAB — URINALYSIS, ROUTINE W REFLEX MICROSCOPIC
Bilirubin Urine: NEGATIVE
Glucose, UA: NEGATIVE mg/dL
Ketones, ur: NEGATIVE mg/dL
Nitrite: POSITIVE — AB
Protein, ur: 100 mg/dL — AB
Specific Gravity, Urine: 1.019 (ref 1.005–1.030)
WBC, UA: >50 WBC/hpf (ref 0–5)
pH: 5 (ref 5.0–8.0)

## 2023-11-28 LAB — COMPREHENSIVE METABOLIC PANEL WITH GFR
ALT: 13 U/L (ref 0–44)
AST: 17 U/L (ref 15–41)
Albumin: 3.7 g/dL (ref 3.5–5.0)
Alkaline Phosphatase: 62 U/L (ref 38–126)
Anion gap: 13 (ref 5–15)
BUN: 20 mg/dL (ref 8–23)
CO2: 28 mmol/L (ref 22–32)
Calcium: 9.8 mg/dL (ref 8.9–10.3)
Chloride: 99 mmol/L (ref 98–111)
Creatinine, Ser: 1.38 mg/dL — ABNORMAL HIGH (ref 0.44–1.00)
GFR, Estimated: 38 mL/min — ABNORMAL LOW (ref >60)
Glucose, Bld: 115 mg/dL — ABNORMAL HIGH (ref 70–99)
Potassium: 3.1 mmol/L — ABNORMAL LOW (ref 3.5–5.1)
Sodium: 140 mmol/L (ref 135–145)
Total Bilirubin: 1.8 mg/dL — ABNORMAL HIGH (ref 0.0–1.2)
Total Protein: 6.5 g/dL (ref 6.5–8.1)

## 2023-11-28 LAB — TYPE AND SCREEN
ABO/RH(D): O POS
Antibody Screen: NEGATIVE

## 2023-11-28 LAB — SURGICAL PCR SCREEN
MRSA, PCR: NEGATIVE
Staphylococcus aureus: NEGATIVE

## 2023-11-28 LAB — APTT: aPTT: 33 s (ref 24–36)

## 2023-11-28 LAB — PROTIME-INR
INR: 1 (ref 0.8–1.2)
Prothrombin Time: 13.6 s (ref 11.4–15.2)

## 2023-11-28 MED ORDER — SULFAMETHOXAZOLE-TRIMETHOPRIM 800-160 MG PO TABS: 1.0000 | ORAL_TABLET | Freq: Two times a day (BID) | ORAL | 0 refills | Status: AC

## 2023-11-28 NOTE — Progress Notes (Signed)
 Notified Haskell Linker at Dr. Mick Alamin office of the lab results (urinalysis) from 11/28/23.

## 2023-11-28 NOTE — Progress Notes (Signed)
 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]   Pre-op diagnosis: RIGHT CAROTID ARTERY STENOSIS   [facility] / [facility]   Surgeons: Margherita Shell, MD       DISCUSSION:  VS: BP 137/65   Pulse 91   Resp 17   Ht 5\' 1"  (1.549 m)   Wt 80.7 kg   SpO2 95%   BMI 33.63 kg/m   PROVIDERS: Verma Gobble, NP   LABS: {CHL AN LABS REVIEWED:112001::"Labs reviewed: Acceptable for surgery."} (all labs ordered are listed, but only abnormal results are displayed)  Labs Reviewed  COMPREHENSIVE METABOLIC PANEL WITH GFR - Abnormal; Notable for the following components:      Result Value   Potassium 3.1 (*)    Glucose, Bld 115 (*)    Creatinine, Ser 1.38 (*)    Total Bilirubin 1.8 (*)    GFR, Estimated 38 (*)    All other components within normal limits  URINALYSIS, ROUTINE W REFLEX MICROSCOPIC - Abnormal; Notable for the following components:   Color, Urine AMBER (*)    APPearance CLOUDY (*)    Hgb urine dipstick SMALL (*)    Protein, ur 100 (*)    Nitrite POSITIVE (*)    Leukocytes,Ua LARGE (*)    Bacteria, UA MANY (*)    All other components within normal limits  SURGICAL PCR SCREEN  CBC  PROTIME-INR  APTT  TYPE AND SCREEN     IMAGES:   EKG:   CV:  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   CKD (chronic kidney disease), stage IV (HCC)    Cognitive impairment    Colon polyps    Community acquired pneumonia    Depression    Diverticulosis    Dysuria    Encephalomalacia    GAD (generalized  anxiety disorder)    GERD (gastroesophageal reflux disease)    Glaucoma    Hearing loss    Hemorrhoids    Hypertension    Hypothyroidism    Incontinence    Mixed hyperlipidemia    Myocardial infarction (HCC)    NSTEMI (non-ST elevated myocardial infarction) (HCC) 12/19/2011   OSA on CPAP    PAF (paroxysmal atrial fibrillation) (HCC)    Peripheral arterial disease (HCC)    Pneumonitis 04/26/2015   Pyelonephritis due to Escherichia coli 04/28/2015   Sepsis (HCC)    Spondylosis of lumbar region without myelopathy or radiculopathy    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HCC)    Urticaria    Vertigo, benign positional     Past Surgical History:  Procedure Laterality Date   ABDOMINAL AORTOGRAM W/LOWER EXTREMITY N/A 08/22/2017   Procedure: ABDOMINAL AORTOGRAM W/LOWER EXTREMITY;  Surgeon: Margherita Shell, MD;  [facility]: [facility] INVASIVE CV LAB;  Service: Cardiovascular;  Laterality: N/A;   BILATERAL UPPER EXTREMITY ANGIOGRAM N/A 09/11/2012   Procedure: BILATERAL UPPER EXTREMITY ANGIOGRAM;  Surgeon: Margherita Shell, MD;  Location: [facility] CATH LAB;  Service: Cardiovascular;  Laterality: N/A;   BIOPSY  03/25/2020   Procedure: BIOPSY;  Surgeon: Normie Becton., MD;  Location: [facility] ENDOSCOPY;  Service: Gastroenterology;;   carotid duplex doppler Bilateral 09/03/2012, 11/03/2011   Evidence of 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 patent left common carotid- subclavian artery bypass with turbulent flow noted at the anastomosis with velocities of 295 cm/s   CAROTID-SUBCLAVIAN BYPASS GRAFT  12/15/2011   Procedure: BYPASS GRAFT CAROTID-SUBCLAVIAN;  Surgeon: Margherita Shell, MD;  Location: [facility] OR;  Service: Vascular;  Laterality: Left;  Left Carotid subclavian bypass   CORONARY ANGIOPLASTY     CORONARY ARTERY BYPASS GRAFT     CORONARY ATHERECTOMY N/A 03/19/2020    Procedure: CORONARY ATHERECTOMY;  Surgeon: Lucendia Rusk, MD;  Location: [facility] INVASIVE CV LAB;  Service: Cardiovascular;  Laterality: N/A;   CORONARY STENT INTERVENTION N/A 03/19/2020   Procedure: CORONARY STENT INTERVENTION;  Surgeon: Lucendia Rusk, MD;  Location: [facility] INVASIVE CV LAB;  Service: Cardiovascular;  Laterality: N/A;   DOPPLER ECHOCARDIOGRAPHY  05/27/2010, 09/17/2008   Mild Proximal septal thickening is noted. Left ventricular systolic functions is normal ejection fraction =>55%. the aortic valve appears to be mildly sclerotic    ESOPHAGOGASTRODUODENOSCOPY (EGD) WITH PROPOFOL  N/A 03/25/2020   Procedure: ESOPHAGOGASTRODUODENOSCOPY (EGD) WITH PROPOFOL ;  Surgeon: Brice Campi Albino Alu., MD;  Location: [facility] ENDOSCOPY;  Service: Gastroenterology;  Laterality: N/A;   fem-fem bypass graft  1999   holter monitor  01/21/2008   The predominant rhythm was normal sinus rhythm. Minimum heartrate of 63 bpm at +01:00, maximum heartrate of 105 bpm at + 10:35; and the average heartrate of 75 bpm. Ventricular ectopic activity totaled 1270: Multifocal; 866-PVC's and 404-VEs              JOINT REPLACEMENT     Left knee   LEFT HEART CATH AND CORS/GRAFTS ANGIOGRAPHY N/A 03/19/2020   Procedure: LEFT HEART CATH AND CORS/GRAFTS ANGIOGRAPHY;  Surgeon: Lucendia Rusk, MD;  [facility] INVAS[facility] CV LAB;  Service: Cardiovascular;  Laterality: N/A;   LEFT HEART CATHETERIZATION WITH CORONARY/GRAFT ANGIOGRAM N/A 12/21/2011   Procedure: LEFT HEART CATHETERIZATION WITH Estella Helling;  Surgeon: Arleen Lacer, MD;  Location: [facility] CATH LAB;  Service: Cardiovascular;  Laterality: N/A;   NM MYOCAR PERF EJECTION FRACTION  09/22/2009, 07/03/2007   the post stress myocardial perfusion images show a normal pattern of perfusion is all regions. The post-stress ejection fraction is 68 %. no significant wall motion abnormalities noted. This is a low risk scan.   PERIPHERAL VASCULAR INTERVENTION  08/22/2017    Procedure: PERIPHERAL VASCULAR INTERVENTION;  Surgeon: Margherita Shell, MD;  [facility] CV LAB;  Service: Cardiovascular;;  Fem/Fem Graft   REPLACEMENT TOTAL KNEE  05-2011   UNILATERAL UPPER EXTREMEITY ANGIOGRAM Left 11/15/2011   Procedure: UNILATERAL UPPER Riki Chatters;  Surgeon: Avanell Leigh, MD;  Location: [facility] CATH LAB;  Service: Cardiovascular;  Laterality: Left;    MEDICATIONS:  clopidogrel  (PLAVIX ) 75 MG tablet   albuterol  (VENTOLIN  HFA) 108 (90 Base) MCG/ACT inhaler   ALPRAZolam  (XANAX ) 0.5 MG tablet   aspirin  EC 81 MG tablet   atorvastatin  (LIPITOR) 80 MG tablet   busPIRone  (BUSPAR ) 15 MG tablet   Cholecalciferol  (VITAMIN D3) 1.25 MG (50000 UT) CAPS   Cyanocobalamin  (VITAMIN B-12 PO)   divalproex  (DEPAKOTE  ER) 250 MG 24 hr tablet   dorzolamide  (TRUSOPT ) 2 % ophthalmic solution   doxycycline  (VIBRAMYCIN ) 100 MG capsule   escitalopram  (LEXAPRO ) 20 MG tablet   ezetimibe  (ZETIA ) 10 MG tablet  furosemide  (LASIX ) 40 MG tablet   gabapentin  (NEURONTIN ) 100 MG capsule   ketoconazole  (NIZORAL ) 2 % shampoo   levothyroxine  (SYNTHROID ) 175 MCG tablet   meclizine  (ANTIVERT ) 25 MG tablet   methylPREDNISolone  (MEDROL  DOSEPAK) 4 MG TBPK tablet   metoprolol  succinate (TOPROL -XL) 25 MG 24 hr tablet   nitrofurantoin, macrocrystal-monohydrate, (MACROBID) 100 MG capsule   nitroGLYCERIN  (NITROSTAT ) 0.4 MG SL tablet   pantoprazole  (PROTONIX ) 40 MG tablet   rosuvastatin  (CRESTOR ) 40 MG tablet   sulfamethoxazole -trimethoprim  (BACTRIM  DS) 800-160 MG tablet   TYLENOL  500 MG tablet   No current facility-administered medications for this encounter.

## 2023-11-28 NOTE — Progress Notes (Signed)
 Surgical Instructions     Your procedure is scheduled on Wednesday, April 23rd. Report to [facility] Main Entrance "A" at 07:45 A.M., then check in with the Admitting office. Any questions or running late day of surgery: call [phone]   Questions prior to your surgery date: call [phone], Monday-Friday, 8am-4pm. If you experience any cold or flu symptoms such as cough, fever, chills, shortness of breath, etc. between now and your scheduled surgery, please notify us  at the above number.            Remember:       Do not eat after midnight the night before your surgery     You may drink clear liquids until 06:45 AM the morning of your surgery.   Clear liquids allowed are: Water, Non-Citrus Juices (without pulp), Carbonated Beverages, Clear Tea (no milk, honey, etc.), Black Coffee Only (NO MILK, CREAM OR POWDERED CREAMER of any kind), and Gatorade.          Take these medicines the morning of surgery with A SIP OF WATER  busPIRone  (BUSPAR )  dorzolamide  (TRUSOPT ) eye drops escitalopram  (LEXAPRO )  ezetimibe  (ZETIA )  gabapentin  (NEURONTIN )  levothyroxine  (SYNTHROID )  metoprolol  succinate (TOPROL -XL)  pantoprazole  (PROTONIX )  TYLENOL     May take these medicines IF NEEDED: albuterol  (VENTOLIN  HFA)- bring inhaler with you on day of surgery ALPRAZolam  (XANAX )  meclizine  (ANTIVERT )  nitroGLYCERIN  (NITROSTAT )- If you have to take this medication prior to surgery, please call [phone] and report this to a nurse    STOP taking Plavix  5 days prior to surgery. Last dose 4/17. STOP taking Aspirin  7 days prior to surgery. Last dose 4/15.   One week prior to surgery, STOP taking any Aleve, Naproxen, Ibuprofen, Motrin, Advil, Goody's, BC's, all herbal medications, fish oil, and non-prescription vitamins.                     Do NOT Smoke (Tobacco/Vaping) for 24 hours prior to your procedure.   If you use a CPAP at night, you may bring your mask/headgear for your overnight stay.    You will be asked to remove any contacts, glasses, piercing's, hearing aid's, dentures/partials prior to surgery. Please bring cases for these items if needed.    Patients discharged the day of surgery will not be allowed to drive home, and someone needs to stay with them for 24 hours.   SURGICAL WAITING ROOM VISITATION Patients may have no more than 2 support people in the waiting area - these visitors may rotate.   Pre-op nurse will coordinate an appropriate time for 1 ADULT support person, who may not rotate, to accompany patient in pre-op.  Children under the age of 62 must have an adult with them who is not the patient and must remain in the main waiting area with an adult.   If the patient needs to stay at the hospital during part of their recovery, the visitor guidelines for inpatient rooms apply.   Please refer to the [facility] website for the visitor guidelines for any additional information.     If you received a COVID test during your pre-op visit  it is requested that you wear a mask when out in public, stay away from anyone that may not be feeling well and notify your surgeon if you develop symptoms. If you have been in contact with anyone that has tested positive in the last 10 days please notify you surgeon.         Pre-operative  CHG Bathing Instructions    You can play a key role in reducing the risk of infection after surgery. Your skin needs to be as free of germs as possible. You can reduce the number of germs on your skin by washing with CHG (chlorhexidine  gluconate) soap before surgery. CHG is an antiseptic soap that kills germs and continues to kill germs even after washing.    DO NOT use if you have an allergy to chlorhexidine /CHG or antibacterial soaps. If your skin becomes reddened or irritated, stop using the CHG and notify one of our RNs at [phone].               TAKE A SHOWER THE NIGHT BEFORE SURGERY AND THE DAY OF SURGERY     Please keep in mind the  following:  DO NOT shave, including legs and underarms, 48 hours prior to surgery.   You may shave your face before/day of surgery.  Place clean sheets on your bed the night before surgery Use a clean washcloth (not used since being washed) for each shower. DO NOT sleep with pet's night before surgery.   CHG Shower Instructions:  Wash your face and private area with normal soap. If you choose to wash your hair, wash first with your normal shampoo.  After you use shampoo/soap, rinse your hair and body thoroughly to remove shampoo/soap residue.  Turn the water OFF and apply half the bottle of CHG soap to a CLEAN washcloth.  Apply CHG soap ONLY FROM YOUR NECK DOWN TO YOUR TOES (washing for 3-5 minutes)  DO NOT use CHG soap on face, private areas, open wounds, or sores.  Pay special attention to the area where your surgery is being performed.  If you are having back surgery, having someone wash your back for you may be helpful. Wait 2 minutes after CHG soap is applied, then you may rinse off the CHG soap.  Pat dry with a clean towel  Put on clean pajamas     Additional instructions for the day of surgery: DO NOT APPLY any lotions, deodorants, cologne, or perfumes.   Do not wear jewelry or makeup Do not wear nail polish, gel polish, artificial nails, or any other type of covering on natural nails (fingers and toes) Do not bring valuables to the hospital. [facility] is not responsible for valuables/personal belongings. Put on clean/comfortable clothes.  Please brush your teeth.  Ask your nurse before applying any prescription medications to the skin.

## 2023-11-28 NOTE — Progress Notes (Addendum)
 [name] is her POA and was at the appointment with the patient.   PCP - Dr. Gilbert Lab Cardiologist - Dr. Lauro Portal LOV 11-06-23; follow up in 3 months  PPM/ICD - Denies Device Orders - n/a Rep Notified - n/a  Chest x-ray - N/A EKG - 11-06-23 Stress Test - 11-16-17 ECHO - 05-09-21 Cardiac Cath - 03-19-20  Sleep Study - Yes CPAP - wears nightly however forgets to put pack on at the evening d/t a traumatic brain injury and start of dementia  Non-diabetic  Last dose of GLP1 agonist-  Denies GLP1 instructions: n/a  Blood Thinner Instructions: Plavix  - per patient last dose took on 11/27/23. Per patient she was told to stop taking 1 day prior to surgery. Aspirin  Instructions: Per patient last dose taken on 11/24/23.  ERAS Protcol - Clears until 0645 PRE-SURGERY Ensure or G2- none  COVID TEST- n/a   Anesthesia review: Yes, cardiac clearance  Patient denies shortness of breath, fever, cough and chest pain at PAT appointment. Patient denies any respiratory issues at this time.    All instructions explained to the patient, with a verbal understanding of the material. Patient agrees to go over the instructions while at home for a better understanding. Patient also instructed to self quarantine after being tested for COVID-19. The opportunity to ask questions was provided.

## 2023-11-29 ENCOUNTER — Other Ambulatory Visit: Payer: Self-pay

## 2023-11-29 DIAGNOSIS — F411 Generalized anxiety disorder: Secondary | ICD-10-CM

## 2023-11-29 MED ORDER — METOPROLOL SUCCINATE ER 25 MG PO TB24: 25.0000 mg | ORAL_TABLET | Freq: Every day | ORAL | 1 refills | Status: AC

## 2023-11-29 MED ORDER — EZETIMIBE 10 MG PO TABS
10.0000 mg | ORAL_TABLET | Freq: Every day | ORAL | 1 refills | Status: DC
Start: 2023-11-29 — End: 2024-03-14

## 2023-11-29 NOTE — Telephone Encounter (Signed)
 Copied from CRM [id number]. Topic: Clinical - Prescription Issue >> Nov 29, 2023  2:02 PM Blair Bumpers wrote: Reason for CRM: Patient's daughter, [name], called in stating that her mom was suppose to have a bypass surgery today. She states that it had to be postponed because it was discovered that patient has a UTI. [name] states she was trying to fill meds online and some of them has been removed and she needs them added back. The medications that she states they are needing is metoprolol  succinate 25 mg, ezetimibe  10 mg, alprazolam  0.5mg (which she states pt has severe sundowners & she's trying to keep her calm as much as possible), & gabapentin  100 mg(which daughter states she feels like patient needs more. She states she needs 4). [name] also stated that her mom left shoulder is still a very bad problem and she has been having excruciating pain. She would like to have the meds refilled and sent over to the pharmacy as well.   All medications mentioned are on the patients active medication list, outgoing call placed to [name].  Patient is requesting a refill of the following medications: Requested Prescriptions   Pending Prescriptions Disp Refills   ALPRAZolam  (XANAX ) 0.5 MG tablet 2 tablet 0    Sig: Take 1 tablet (0.5 mg total) by mouth daily as needed for up to 2 doses for anxiety.   Signed Prescriptions Disp Refills   ezetimibe  (ZETIA ) 10 MG tablet 90 tablet 1    Sig: Take 1 tablet (10 mg total) by mouth daily.    Authorizing Provider: EUBANKS, JESSICA K    Ordering User: Akia Montalban C   metoprolol  succinate (TOPROL -XL) 25 MG 24 hr tablet 90 tablet 1    Sig: Take 1 tablet (25 mg total) by mouth daily.    Authorizing Provider: EUBANKS, JESSICA K    Ordering User: Fredric Jean    Date of last refill:08/23/23  Refill amount: 2  Treatment agreement date: No treatment agreement on file, notation made on pending appointment

## 2023-11-29 NOTE — Telephone Encounter (Signed)
 Please advise why xanax  was denied

## 2023-11-30 NOTE — Anesthesia Preprocedure Evaluation (Signed)
 Anesthesia Evaluation    Airway        Dental   Pulmonary former smoker          Cardiovascular hypertension,      Neuro/Psych    GI/Hepatic   Endo/Other    Renal/GU      Musculoskeletal   Abdominal   Peds  Hematology   Anesthesia Other Findings   Reproductive/Obstetrics                             Anesthesia Physical Anesthesia Plan  ASA:   Anesthesia Plan:    Post-op Pain Management:    Induction:   PONV Risk Score and Plan:   Airway Management Planned:   Additional Equipment:   Intra-op Plan:   Post-operative Plan:   Informed Consent:   Plan Discussed with:   Anesthesia Plan Comments: (PAT note written 11/30/2023 by Allesha Aronoff, PA-C.  )       Anesthesia Quick Evaluation

## 2023-11-30 NOTE — Telephone Encounter (Signed)
 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 She will need to request from that office.

## 2023-12-04 NOTE — Progress Notes (Signed)
 Patients daughter [name] updated with new surgery date and arrival time of 0700.  Patient had PAT appointment 11/28/2023.

## 2023-12-05 DIAGNOSIS — H353131 Nonexudative age-related macular degeneration, bilateral, early dry stage: Secondary | ICD-10-CM | POA: Diagnosis not present

## 2023-12-05 DIAGNOSIS — H5201 Hypermetropia, right eye: Secondary | ICD-10-CM | POA: Diagnosis not present

## 2023-12-05 DIAGNOSIS — Z961 Presence of intraocular lens: Secondary | ICD-10-CM | POA: Diagnosis not present

## 2023-12-06 ENCOUNTER — Other Ambulatory Visit: Payer: Self-pay | Admitting: Surgery

## 2023-12-06 ENCOUNTER — Other Ambulatory Visit: Payer: Self-pay

## 2023-12-06 ENCOUNTER — Encounter (HOSPITAL_COMMUNITY): Payer: Self-pay | Admitting: Surgery

## 2023-12-06 ENCOUNTER — Encounter (HOSPITAL_COMMUNITY): Payer: Self-pay | Admitting: Vascular Surgery

## 2023-12-06 ENCOUNTER — Ambulatory Visit (HOSPITAL_COMMUNITY)
Admission: RE | Admit: 2023-12-06 | Discharge: 2023-12-06 | Disposition: A | Discharge status: Home / Self Care | Attending: Surgery | Admitting: Surgery

## 2023-12-06 ENCOUNTER — Encounter (HOSPITAL_COMMUNITY): Payer: Self-pay | Admitting: Anesthesiology

## 2023-12-06 ENCOUNTER — Encounter (HOSPITAL_COMMUNITY)
Admission: RE | Disposition: A | Discharge status: Home / Self Care | Payer: Self-pay | Source: Home / Self Care | Attending: Surgery

## 2023-12-06 DIAGNOSIS — Z538 Procedure and treatment not carried out for other reasons: Secondary | ICD-10-CM | POA: Diagnosis not present

## 2023-12-06 DIAGNOSIS — I6521 Occlusion and stenosis of right carotid artery: Secondary | ICD-10-CM | POA: Diagnosis not present

## 2023-12-06 DIAGNOSIS — I6523 Occlusion and stenosis of bilateral carotid arteries: Secondary | ICD-10-CM

## 2023-12-06 LAB — URINALYSIS, ROUTINE W REFLEX MICROSCOPIC
Bilirubin Urine: NEGATIVE
Glucose, UA: NEGATIVE mg/dL
Hgb urine dipstick: NEGATIVE
Ketones, ur: NEGATIVE mg/dL
Nitrite: NEGATIVE
Protein, ur: NEGATIVE mg/dL
Specific Gravity, Urine: 1.014 (ref 1.005–1.030)
pH: 5 (ref 5.0–8.0)

## 2023-12-06 SURGERY — ENDARTERECTOMY, CAROTID
Anesthesia: Choice | Laterality: Right

## 2023-12-06 MED ORDER — ONDANSETRON HCL 4 MG/2ML IJ SOLN
INTRAMUSCULAR | Status: AC
  Filled 2023-12-06: qty 2

## 2023-12-06 MED ORDER — CHLORHEXIDINE GLUCONATE CLOTH 2 % EX PADS: 6.0000 | MEDICATED_PAD | Freq: Once | CUTANEOUS | Status: DC

## 2023-12-06 MED ORDER — PROPOFOL 10 MG/ML IV BOLUS
INTRAVENOUS | Status: AC
  Filled 2023-12-06: qty 20

## 2023-12-06 MED ORDER — DEXAMETHASONE SODIUM PHOSPHATE 10 MG/ML IJ SOLN
INTRAMUSCULAR | Status: AC
  Filled 2023-12-06: qty 1

## 2023-12-06 MED ORDER — ROCURONIUM BROMIDE 10 MG/ML (PF) SYRINGE
PREFILLED_SYRINGE | INTRAVENOUS | Status: AC
  Filled 2023-12-06: qty 10

## 2023-12-06 MED ORDER — SODIUM CHLORIDE 0.9 % IV SOLN
0.1500 ug/kg/min | INTRAVENOUS | Status: DC
  Filled 2023-12-06: qty 2000

## 2023-12-06 MED ORDER — ACETAMINOPHEN 10 MG/ML IV SOLN
INTRAVENOUS | Status: AC
  Filled 2023-12-06: qty 100

## 2023-12-06 MED ORDER — ORAL CARE MOUTH RINSE: 15.0000 mL | Freq: Once | OROMUCOSAL | Status: AC

## 2023-12-06 MED ORDER — LIDOCAINE 2% (20 MG/ML) 5 ML SYRINGE
INTRAMUSCULAR | Status: AC
  Filled 2023-12-06: qty 5

## 2023-12-06 MED ORDER — CIPROFLOXACIN HCL 500 MG PO TABS: 500.0000 mg | ORAL_TABLET | Freq: Two times a day (BID) | ORAL | 0 refills | Status: DC

## 2023-12-06 MED ORDER — CEFAZOLIN SODIUM-DEXTROSE 2-4 GM/100ML-% IV SOLN
2.0000 g | INTRAVENOUS | Status: DC
  Filled 2023-12-06: qty 100

## 2023-12-06 MED ORDER — CHLORHEXIDINE GLUCONATE 0.12 % MT SOLN
15.0000 mL | Freq: Once | OROMUCOSAL | Status: AC
  Administered 2023-12-06: 15 mL via OROMUCOSAL
  Filled 2023-12-06: qty 15

## 2023-12-06 MED ORDER — SODIUM CHLORIDE 0.9 % IV SOLN: INTRAVENOUS | Status: DC

## 2023-12-06 MED ORDER — SODIUM CHLORIDE 0.9% FLUSH: 3.0000 mL | Freq: Two times a day (BID) | INTRAVENOUS | Status: DC

## 2023-12-06 MED ORDER — SODIUM CHLORIDE 0.9% FLUSH: 3.0000 mL | INTRAVENOUS | Status: DC | PRN

## 2023-12-06 NOTE — Progress Notes (Signed)
 Giving Cipro  for prolonged UTI

## 2023-12-06 NOTE — H&P (View-Only) (Signed)
 Giving Cipro  for prolonged UTI

## 2023-12-06 NOTE — Progress Notes (Signed)
 Sx is cancelled per Dr. Charlotte Cookey due to abnormal U/A.

## 2023-12-11 ENCOUNTER — Other Ambulatory Visit: Payer: Self-pay

## 2023-12-11 ENCOUNTER — Emergency Department (HOSPITAL_COMMUNITY)
Admission: EM | Admit: 2023-12-11 | Discharge: 2023-12-11 | Disposition: A | Discharge status: Home / Self Care | Source: Home / Self Care | Attending: Emergency Medicine | Admitting: Emergency Medicine

## 2023-12-11 ENCOUNTER — Emergency Department (HOSPITAL_COMMUNITY)

## 2023-12-11 ENCOUNTER — Encounter (HOSPITAL_COMMUNITY): Payer: Self-pay | Admitting: Emergency Medicine

## 2023-12-11 ENCOUNTER — Telehealth: Payer: Self-pay

## 2023-12-11 DIAGNOSIS — E782 Mixed hyperlipidemia: Secondary | ICD-10-CM | POA: Diagnosis not present

## 2023-12-11 DIAGNOSIS — E039 Hypothyroidism, unspecified: Secondary | ICD-10-CM | POA: Diagnosis not present

## 2023-12-11 DIAGNOSIS — I6523 Occlusion and stenosis of bilateral carotid arteries: Secondary | ICD-10-CM | POA: Diagnosis not present

## 2023-12-11 DIAGNOSIS — G8929 Other chronic pain: Secondary | ICD-10-CM | POA: Insufficient documentation

## 2023-12-11 DIAGNOSIS — G4733 Obstructive sleep apnea (adult) (pediatric): Secondary | ICD-10-CM | POA: Diagnosis not present

## 2023-12-11 DIAGNOSIS — Z87440 Personal history of urinary (tract) infections: Secondary | ICD-10-CM | POA: Diagnosis not present

## 2023-12-11 DIAGNOSIS — K219 Gastro-esophageal reflux disease without esophagitis: Secondary | ICD-10-CM | POA: Diagnosis not present

## 2023-12-11 DIAGNOSIS — I252 Old myocardial infarction: Secondary | ICD-10-CM | POA: Diagnosis not present

## 2023-12-11 DIAGNOSIS — Z87820 Personal history of traumatic brain injury: Secondary | ICD-10-CM | POA: Diagnosis not present

## 2023-12-11 DIAGNOSIS — I10 Essential (primary) hypertension: Secondary | ICD-10-CM | POA: Diagnosis not present

## 2023-12-11 DIAGNOSIS — D62 Acute posthemorrhagic anemia: Secondary | ICD-10-CM | POA: Diagnosis not present

## 2023-12-11 DIAGNOSIS — F989 Unspecified behavioral and emotional disorders with onset usually occurring in childhood and adolescence: Secondary | ICD-10-CM | POA: Insufficient documentation

## 2023-12-11 DIAGNOSIS — I130 Hypertensive heart and chronic kidney disease with heart failure and stage 1 through stage 4 chronic kidney disease, or unspecified chronic kidney disease: Secondary | ICD-10-CM | POA: Diagnosis not present

## 2023-12-11 DIAGNOSIS — F0390 Unspecified dementia without behavioral disturbance: Secondary | ICD-10-CM | POA: Insufficient documentation

## 2023-12-11 DIAGNOSIS — H9190 Unspecified hearing loss, unspecified ear: Secondary | ICD-10-CM | POA: Diagnosis not present

## 2023-12-11 DIAGNOSIS — M25512 Pain in left shoulder: Secondary | ICD-10-CM | POA: Insufficient documentation

## 2023-12-11 DIAGNOSIS — N184 Chronic kidney disease, stage 4 (severe): Secondary | ICD-10-CM | POA: Diagnosis not present

## 2023-12-11 DIAGNOSIS — Z7982 Long term (current) use of aspirin: Secondary | ICD-10-CM | POA: Insufficient documentation

## 2023-12-11 DIAGNOSIS — I6782 Cerebral ischemia: Secondary | ICD-10-CM | POA: Diagnosis not present

## 2023-12-11 DIAGNOSIS — R4689 Other symptoms and signs involving appearance and behavior: Secondary | ICD-10-CM

## 2023-12-11 DIAGNOSIS — G9389 Other specified disorders of brain: Secondary | ICD-10-CM | POA: Diagnosis not present

## 2023-12-11 DIAGNOSIS — R4182 Altered mental status, unspecified: Secondary | ICD-10-CM | POA: Insufficient documentation

## 2023-12-11 DIAGNOSIS — I5042 Chronic combined systolic (congestive) and diastolic (congestive) heart failure: Secondary | ICD-10-CM | POA: Diagnosis not present

## 2023-12-11 DIAGNOSIS — Z860100 Personal history of colon polyps, unspecified: Secondary | ICD-10-CM | POA: Diagnosis not present

## 2023-12-11 DIAGNOSIS — I2510 Atherosclerotic heart disease of native coronary artery without angina pectoris: Secondary | ICD-10-CM | POA: Diagnosis not present

## 2023-12-11 DIAGNOSIS — I480 Paroxysmal atrial fibrillation: Secondary | ICD-10-CM | POA: Diagnosis not present

## 2023-12-11 DIAGNOSIS — I739 Peripheral vascular disease, unspecified: Secondary | ICD-10-CM | POA: Diagnosis not present

## 2023-12-11 LAB — CBC
HCT: 43.9 % (ref 36.0–46.0)
Hemoglobin: 14.2 g/dL (ref 12.0–15.0)
MCH: 28.7 pg (ref 26.0–34.0)
MCHC: 32.3 g/dL (ref 30.0–36.0)
MCV: 88.7 fL (ref 80.0–100.0)
Platelets: 230 10*3/uL (ref 150–400)
RBC: 4.95 MIL/uL (ref 3.87–5.11)
RDW: 13.9 % (ref 11.5–15.5)
WBC: 7.7 10*3/uL (ref 4.0–10.5)
nRBC: 0 % (ref 0.0–0.2)

## 2023-12-11 LAB — ETHANOL: Alcohol, Ethyl (B): <15 mg/dL (ref <15)

## 2023-12-11 LAB — COMPREHENSIVE METABOLIC PANEL WITH GFR
ALT: 14 U/L (ref 0–44)
AST: 20 U/L (ref 15–41)
Albumin: 4 g/dL (ref 3.5–5.0)
Alkaline Phosphatase: 56 U/L (ref 38–126)
Anion gap: 14 (ref 5–15)
BUN: 14 mg/dL (ref 8–23)
CO2: 23 mmol/L (ref 22–32)
Calcium: 10 mg/dL (ref 8.9–10.3)
Chloride: 103 mmol/L (ref 98–111)
Creatinine, Ser: 1.2 mg/dL — ABNORMAL HIGH (ref 0.44–1.00)
GFR, Estimated: 45 mL/min — ABNORMAL LOW (ref >60)
Glucose, Bld: 108 mg/dL — ABNORMAL HIGH (ref 70–99)
Potassium: 4.2 mmol/L (ref 3.5–5.1)
Sodium: 140 mmol/L (ref 135–145)
Total Bilirubin: 1.5 mg/dL — ABNORMAL HIGH (ref 0.0–1.2)
Total Protein: 6.8 g/dL (ref 6.5–8.1)

## 2023-12-11 LAB — URINALYSIS, ROUTINE W REFLEX MICROSCOPIC
Bilirubin Urine: NEGATIVE
Glucose, UA: NEGATIVE mg/dL
Hgb urine dipstick: NEGATIVE
Ketones, ur: NEGATIVE mg/dL
Leukocytes,Ua: NEGATIVE
Nitrite: NEGATIVE
Protein, ur: NEGATIVE mg/dL
Specific Gravity, Urine: 1.011 (ref 1.005–1.030)
pH: 6 (ref 5.0–8.0)

## 2023-12-11 LAB — RAPID URINE DRUG SCREEN, HOSP PERFORMED
Amphetamines: NOT DETECTED
Barbiturates: NOT DETECTED
Benzodiazepines: POSITIVE — AB
Cocaine: NOT DETECTED
Opiates: NOT DETECTED
Tetrahydrocannabinol: NOT DETECTED

## 2023-12-11 LAB — I-STAT CG4 LACTIC ACID, ED: Lactic Acid, Venous: 1 mmol/L (ref 0.5–1.9)

## 2023-12-11 MED ORDER — LIDOCAINE 5 % EX PTCH: 1.0000 | MEDICATED_PATCH | CUTANEOUS | 0 refills | Status: DC

## 2023-12-11 MED ORDER — LIDOCAINE 5 % EX PTCH
1.0000 | MEDICATED_PATCH | CUTANEOUS | Status: DC
  Administered 2023-12-11: 1 via TRANSDERMAL
  Filled 2023-12-11: qty 1

## 2023-12-11 NOTE — ED Triage Notes (Signed)
 Pt here from home with daughter with c/o aloc at times , pt is alert and able to answer questions correctly but then starts crying yelling help me , recently been found at the neighbors house in middle of night , has been treated for an UTI for the last month

## 2023-12-11 NOTE — Telephone Encounter (Signed)
 Patient's daughter called to stated that her mother is taking Cipro  for extended UTI symptoms.  Was supposed to have endarterectomy last week but required add'l ABX.  Daughter reports that her mother is "combative, leaving the house in the middle of the night, waking the neighbors."  This behavior is unusual per daughter.  This nurse advised to take patient to the ED for blood tests and possible add'l ABX treatments.  Patient's daughter acknowledged.  Dr. Charlotte Cookey informed.

## 2023-12-11 NOTE — ED Provider Notes (Addendum)
 [place] EMERGENCY DEPARTMENT AT [facility] Provider Note   CSN: [id number] Arrival date & time: 12/11/23  1218     History  Chief Complaint  Patient presents with   Altered Mental Status    Deanna [name] is a 83 y.o. female with history of traumatic brain injury and dementia, recurring UTIs, presenting from home in the company of her daughter with worsening confusion.  Her daughter reports the patient has been on ciprofloxacin  for about a week prescribed by her vascular surgeon for potential UTI type symptoms as she has had worsening confusion over the past 7 days.  Her daughter reports the patient has had progressively worsening dementia for several months, but typically has a waxing and waning presentation, she has been very persistently confused the past few days which is atypical.  Her daughter said the patient was out wandering and knocking on the neighbors doors after the daughter ran out to the pharmacy for 20 minutes.  She said the patient has been extremely paranoid, not trusting the daughter or her home medications and refused to take her medicines this morning.  Her daughter is the sole caretaker of the patient.  She reports that she typically needs to watch the patient 24 hours a day.  There are no other family members that can help them.  Her daughter feels that they cannot afford for memory care placement, but does want to talk to a social worker potentially about home health assistance.  HPI     Home Medications Prior to Admission medications   Medication Sig Start Date End Date Taking? Authorizing Provider  lidocaine  (LIDODERM ) 5 % Place 1 patch onto the skin daily. Remove & Discard patch within 12 hours or as directed by MD 12/11/23  Yes Kanika Bungert, Janalyn Me, MD  albuterol  (VENTOLIN  HFA) 108 [phone] Base) MCG/ACT inhaler Inhale 2 puffs into the lungs every 6 (six) hours as needed for wheezing or shortness of breath. 09/12/23   Tye Gall, MD  ALPRAZolam   (XANAX ) 0.5 MG tablet Take 1 tablet (0.5 mg total) by mouth daily as needed for up to 2 doses for anxiety. 08/23/23   Lesa Rape, MD  aspirin  EC 81 MG tablet Take 81 mg by mouth at bedtime.    [provider]  atorvastatin  (LIPITOR) 80 MG tablet Take 80 mg by mouth at bedtime. 10/26/23   [provider]  busPIRone  (BUSPAR ) 15 MG tablet Take 1 tablet (15 mg total) by mouth 2 (two) times daily. 11/16/22 08/08/48  Roselyn Connor, PMHNP  Cholecalciferol  (VITAMIN D3) 1.25 MG (50000 UT) CAPS Take 1 capsule by mouth once a week. 10/26/23   [provider]  ciprofloxacin  (CIPRO ) 500 MG tablet Take 1 tablet (500 mg total) by mouth 2 (two) times daily. 12/06/23   Margherita Shell, MD  clopidogrel  (PLAVIX ) 75 MG tablet TAKE 1 TABLET(75 MG) BY MOUTH DAILY Patient taking differently: Take 75 mg by mouth at bedtime. 02/27/23   Eubanks, Jessica K, NP  Cyanocobalamin  (VITAMIN B-12 PO) Take 1 tablet by mouth in the morning.    [provider]  divalproex  (DEPAKOTE  ER) 250 MG 24 hr tablet Take 1 tablet (250 mg total) by mouth at bedtime. 11/16/22   Roselyn Connor, PMHNP  dorzolamide  (TRUSOPT ) 2 % ophthalmic solution Place 1 drop into both eyes in the morning and at bedtime.    [provider]  doxycycline  (VIBRAMYCIN ) 100 MG capsule Take 100 mg by mouth 2 (two) times daily. Patient not taking: Reported  on 11/28/2023 10/26/23   [provider]  escitalopram  (LEXAPRO ) 20 MG tablet Take 1 tablet (20 mg total) by mouth daily. 11/16/22   Roselyn Connor, PMHNP  ezetimibe  (ZETIA ) 10 MG tablet Take 1 tablet (10 mg total) by mouth daily. 11/29/23   Verma Gobble, NP  furosemide  (LASIX ) 40 MG tablet TAKE 1 TABLET(40 MG) BY MOUTH DAILY Patient taking differently: Take 40 mg by mouth daily as needed for fluid or edema. 06/03/22   Verma Gobble, NP  gabapentin  (NEURONTIN ) 100 MG capsule TAKE 2 CAPSULES(200 MG) BY MOUTH DAILY Patient taking differently: Take 200 mg by mouth 2  (two) times daily. 11/09/23   Verma Gobble, NP  ketoconazole  (NIZORAL ) 2 % shampoo APPLY 10ML TOPICALLY 2 TIMES A WEEK. 11/03/22   Verma Gobble, NP  levothyroxine  (SYNTHROID ) 175 MCG tablet TAKE 1 TABLET BY MOUTH EVERY DAY AT THE SAME TIME AND SEPARATE FROM ALL OTHER MEDICATIONS 10/12/23   Verma Gobble, NP  meclizine  (ANTIVERT ) 25 MG tablet Take 1 tablet (25 mg total) by mouth as needed for dizziness. Patient taking differently: Take 25 mg by mouth 3 (three) times daily as needed for dizziness. 11/30/18   Verma Gobble, NP  methylPREDNISolone  (MEDROL  DOSEPAK) 4 MG TBPK tablet Take by mouth as directed. Patient not taking: Reported on 11/28/2023 10/26/23   [provider]  metoprolol  succinate (TOPROL -XL) 25 MG 24 hr tablet Take 1 tablet (25 mg total) by mouth daily. 11/29/23   Eubanks, Jessica K, NP  nitrofurantoin, macrocrystal-monohydrate, (MACROBID) 100 MG capsule Take 100 mg by mouth 2 (two) times daily. Patient not taking: Reported on 11/28/2023 11/09/23   [provider]  nitroGLYCERIN  (NITROSTAT ) 0.4 MG SL tablet Place 1 tablet (0.4 mg total) under the tongue every 5 (five) minutes x 3 doses as needed for chest pain. 05/20/21   Ngetich, Dinah C, NP  pantoprazole  (PROTONIX ) 40 MG tablet Take 1 tablet (40 mg total) by mouth daily. 09/12/23   Tye Gall, MD  rosuvastatin  (CRESTOR ) 40 MG tablet TAKE 1 TABLET(40 MG) BY MOUTH DAILY Patient taking differently: Take 40 mg by mouth at bedtime. 06/26/23   Verma Gobble, NP  TYLENOL  500 MG tablet Take 500-1,000 mg by mouth 2 (two) times daily.    [provider]      Allergies    Lidocaine , Cymbalta  [duloxetine  hcl], Donepezil, Mobic [meloxicam], Namenda [memantine], Nsaids, Other, Oxycontin  [oxycodone ], and Vicodin [hydrocodone-acetaminophen ]    Review of Systems   Review of Systems  Physical Exam Updated Vital Signs BP (!) 160/76   Pulse 89   Temp 98.1 F (36.7 C) (Oral)   Resp 14   SpO2  97%  Physical Exam Constitutional:      General: She is not in acute distress. HENT:     Head: Normocephalic and atraumatic.  Eyes:     Conjunctiva/sclera: Conjunctivae normal.     Pupils: Pupils are equal, round, and reactive to light.  Cardiovascular:     Rate and Rhythm: Normal rate and regular rhythm.  Pulmonary:     Effort: Pulmonary effort is normal. No respiratory distress.  Abdominal:     General: There is no distension.     Tenderness: There is no abdominal tenderness.  Skin:    General: Skin is warm and dry.  Neurological:     General: No focal deficit present.     Mental Status: She is alert. Mental status is at baseline.     ED Results / Procedures /  Treatments   Labs (all labs ordered are listed, but only abnormal results are displayed) Labs Reviewed  COMPREHENSIVE METABOLIC PANEL WITH GFR - Abnormal; Notable for the following components:      Result Value   Glucose, Bld 108 (*)    Creatinine, Ser 1.20 (*)    Total Bilirubin 1.5 (*)    GFR, Estimated 45 (*)    All other components within normal limits  RAPID URINE DRUG SCREEN, HOSP PERFORMED - Abnormal; Notable for the following components:   Benzodiazepines POSITIVE (*)    All other components within normal limits  CULTURE, BLOOD (ROUTINE X 2)  CULTURE, BLOOD (ROUTINE X 2)  URINE CULTURE  CBC  URINALYSIS, ROUTINE W REFLEX MICROSCOPIC  ETHANOL  I-STAT CG4 LACTIC ACID, ED    EKG EKG Interpretation Date/Time:  Monday Dec 11 2023 13:42:33 EDT Ventricular Rate:  93 PR Interval:  173 QRS Duration:  93 QT Interval:  381 QTC Calculation: 474 R Axis:   23  Text Interpretation: Sinus rhythm LVH with secondary repolarization abnormality Confirmed by Jerald Molly [phone]) on 12/11/2023 2:32:38 PM  Radiology CT HEAD WO CONTRAST ( ) Result Date: 12/11/2023 CLINICAL DATA:  Mental status change, altered mental status. EXAM: CT HEAD WITHOUT CONTRAST TECHNIQUE: Contiguous axial images were obtained from the  base of the skull through the vertex without intravenous contrast. RADIATION DOSE REDUCTION: This exam was performed according to the departmental dose-optimization program which includes automated exposure control, adjustment of the mA and/or kV according to patient size and/or use of iterative reconstruction technique. COMPARISON:  MRI head 11/16/2016.  CT head 08/16/2023. FINDINGS: Brain: No acute intracranial hemorrhage. No CT evidence of acute infarct. Nonspecific hypoattenuation in the periventricular and subcortical white matter favored to reflect chronic microvascular ischemic changes. Mild parenchymal volume loss. Similar foci of encephalomalacia in the anterior inferior right frontal lobe and in the anterior aspect of both superior frontal gyri. No edema, mass effect, or midline shift. The basilar cisterns are patent. Ventricles: Prominence of the ventricles suggesting underlying parenchymal volume loss. Vascular: Atherosclerotic calcifications of the carotid siphons. No hyperdense vessel. Skull: No acute or aggressive finding. Orbits: Orbits are symmetric. Sinuses: The visualized paranasal sinuses are clear. Other: Mastoid air cells are clear. IMPRESSION: No CT evidence of acute intracranial abnormality. Moderate chronic microvascular ischemic changes and mild parenchymal volume loss. Similar foci of encephalomalacia in the bilateral frontal lobes. Electronically Signed   By: Denny Flack M.D.   On: 12/11/2023 14:07    Procedures Procedures    Medications Ordered in ED Medications  lidocaine  (LIDODERM ) 5 % 1 patch (has no administration in time range)    ED Course/ Medical Decision Making/ A&P                                 Medical Decision Making Amount and/or Complexity of Data Reviewed Labs: ordered. Radiology: ordered.  Risk Prescription drug management.   This patient presents to the Emergency Department with complaint of altered mental status.  This involves an extensive  number of treatment options, and is a complaint that carries with it a high risk of complications and morbidity.  The differential diagnosis includes hypoglycemia vs metabolic encephalopathy vs infection (including cystitis) vs ICH vs stroke vs polypharmacy vs other  I do think it is possible this is also progression of the patient's underlying dementia, particularly given her reported paranoia.  This behavioral change does not seem consistent with  LVO to me.  I ordered, reviewed, and interpreted labs, which were pending at signout at 3 pm, including UA  I ordered imaging studies which included CT head I independently visualized and interpreted imaging which showed no emergent findings and the monitor tracing which showed sinus rhythm Additional history was obtained from the patient's daughter at bedside Previous records obtained and reviewed showing hospitalization in January for encephalopathy, felt to be secondary to urinary tract infection  Last positive urine cx 08/16/23 E Coli 100K resistant to ampicillin/unasyn, susceptible to the other antibiotics including fluoroquinolones  TOC consulted to assist daughter with possible home health.  Face to face ordered.  I explained to the patient's daughter that if there is no clear emergent metabolic or infectious reason for admission at this time, home health may be the next best option.  *update  -I reviewed the patient's labs and workup but there were no emergent findings.  She was seen with a TOC team who made some recommendations regarding seeing an elder attorney in the housing situation as well as home health.  The patient is stable for discharge home at this time in the company of her daughter.  We will give her lidocaine  patches for her chronic left shoulder pain as these have worked in the past, and her daughter confirms the patient is not allergic to this medicine.      Final Clinical Impression(s) / ED Diagnoses Final diagnoses:   Behavioral change  Chronic left shoulder pain    Rx / DC Orders ED Discharge Orders          Ordered    lidocaine  (LIDODERM ) 5 %  Every 24 hours        12/11/23 1602              Arvilla Birmingham, MD 12/11/23 1450    Arvilla Birmingham, MD 12/11/23 [phone]

## 2023-12-11 NOTE — Care Management (Signed)
 ED RNCM met with patient and Daughter Charm Brother)  to discuss transitional care planning.  Patient and daughter reports, patient lives art home with daughter sole care giver.  Patient has TBI with  dementia, but daughter reports symptoms of confusion worsening so she brought her to the ED.  Patient has chronic pain c/o mostly from left shoulder. Discussed HH PT, daughter states, Patient has had HH in the past named [facility]. Reached out to [facility] [employer], but she is unable to accept referral at this time. Updated patient and daughter discussed that I will send the referral out to other agencies within the [facility] network but may not hear back until tomorrow 12/12/23, and I will reach out  with the name of agency that accepted the referral. [facility] is agreeable. Updated the EDP, and patient will be cleared fro discharge.

## 2023-12-12 ENCOUNTER — Telehealth: Payer: Self-pay | Admitting: Surgery

## 2023-12-12 LAB — URINE CULTURE: Culture: NO GROWTH

## 2023-12-12 NOTE — Progress Notes (Signed)
 Unable to reach pt's daughter,[name] Oberg, by phone after leaving several voicemails. Left voicemail informing Ms. Trumbauer of check in time of 0630 at admitting;nothing to eat or drink after midnight. Referred her to printed instructions given at pre admission appointment for all other instructions including bathing,medications.

## 2023-12-12 NOTE — Anesthesia Preprocedure Evaluation (Signed)
 Anesthesia Evaluation  Patient identified by MRN, date of birth, ID band Patient awake    Reviewed: Allergy & Precautions, H&P , NPO status , Patient's Chart, lab work & pertinent test results  Airway Mallampati: III  TM Distance: >3 FB Neck ROM: Full    Dental no notable dental hx. (+) Dental Advisory Given, Poor Dentition, Chipped, Missing   Pulmonary neg pulmonary ROS, shortness of breath, sleep apnea and Continuous Positive Airway Pressure Ventilation , pneumonia, former smoker   Pulmonary exam normal breath sounds clear to auscultation       Cardiovascular hypertension, Pt. on medications + angina with exertion + CAD, + Past MI, + Peripheral Vascular Disease and +CHF  negative cardio ROS Normal cardiovascular exam Rhythm:Regular Rate:Normal     Neuro/Psych  PSYCHIATRIC DISORDERS Anxiety Depression    negative neurological ROS  negative psych ROS   GI/Hepatic negative GI ROS, Neg liver ROS,GERD  ,,  Endo/Other  negative endocrine ROSHypothyroidism    Renal/GU CRFRenal diseasenegative Renal ROS  negative genitourinary   Musculoskeletal negative musculoskeletal ROS (+) Arthritis ,    Abdominal   Peds negative pediatric ROS (+)  Hematology negative hematology ROS (+)   Anesthesia Other Findings   Reproductive/Obstetrics negative OB ROS                             Anesthesia Physical Anesthesia Plan  ASA: 4  Anesthesia Plan: General   Post-op Pain Management: Precedex   Induction: Intravenous  PONV Risk Score and Plan: 3 and TIVA and Treatment may vary due to age or medical condition  Airway Management Planned: Oral ETT and LMA  Additional Equipment: Arterial line  Intra-op Plan:   Post-operative Plan: Extubation in OR  Informed Consent:   Plan Discussed with: Anesthesiologist and CRNA  Anesthesia Plan Comments: (DISCUSSION: Patient is an 83 year old female scheduled for  the above procedure. Surgery was postponed from 11/29/23 to 12/06/23 due to UTI. Bactrim  DS prescribed by surgeon.   History includes former smoker, HTN, CAD (MI s/p PTCA LAD 1994; PTCA LCX 1995; CABG 05/1998: LIMA-LAD, SVG-D2, SVG-OM, SVG-RCA; NSTEMI due to occluded SVG-RCA not amenable to PCI 12/21/11; NSTEMI s/p atherectomy/DES pLAD & unsuccessful atherectomy/PCI mLAD 03/19/20), chronic combined systolic and diastolic CHF, PAF, HLD, PAD (FFBG due occluded right CIA procedure-dilated dissection; DCB angioplasty right limb of FFBG & EVAR of pseudoaneurysm 08/22/17), celiac artery stenosis, carotid/subclavian artery stenosis (left carotid-subclavian bypass 12/15/11, drainage of lymphocele 12/23/11), brachial basilar insufficiency syndrome, CKD, OSA (uses CPAP), TBI with cognitive impairment, GERD, hypothyroidism, glaucoma, osteoarthritis (left TKA 06/06/11), E.coli UTI (admission 08/2023).   Preoperative cardiology evaluation on 11/06/23 by West, Katlyn, NP: "Preoperative cardiac evaluation: Patient is currently pending right carotid endarterectomy with Dr. Charlotte Cookey.  Patient underwent cardiac catheterization on 04/26/2023 at [facility] after presenting with chest pain, cardiac catheterization was unchanged from last cath with intervention in 2021. Echo indicated EF 45%.  Patient denies any anginal symptoms and is euvolemic and well compensated on exam today.  Patient is unable to meet 4 METS of activity.  Discussed with Dr. Katheryne Pane, her primary cardiologist, patient is at least at moderate risk for procedure however given stable coronary artery disease noted on cardiac catheterization in 04/2023 would not pursue further cardiac workup prior to procedure.  Patient may proceed with procedure at moderate cardiac risk.  Per Dr. Katheryne Pane patient may hold Plavix  for 5 days prior to procedure and aspirin  7 days prior to procedure.  Please resume aspirin  and Plavix  when safe to do so from a bleeding standpoint."   BP checks in  LE due to subclavian stenosis. EKG: 11/06/23: Normal sinus rhythm Left ventricular hypertrophy with repolarization abnormality ( R in aVL , Cornell product , Romhilt-Estes ) Confirmed by West, Katlyn [phone]) on 11/06/2023 8:41:00 PM     CV: US  Carotid 10/09/23: Summary:  - Right Carotid: Velocities in the right ICA are consistent with a 80-99% stenosis. Non-hemodynamically significant plaque <50% noted in the CCA. The ECA appears >50% stenosed. Increased velocities of the right ICA compared to prior exam.  - Left Carotid: Patent CCA to SCA bypass graft with no evidence of stenosis. Velocities in the left ICA are consistent with a 40-59% stenosis. Non-hemodynamically significant plaque <50% noted in the  CCA. The ECA appears <50% stenosed.  - Vertebrals:  Bilateral vertebral arteries demonstrate antegrade flow.  - Subclavians: Right subclavian artery was stenotic.      Cardiac cath 04/25/24 ([facility]): 1. Dominant RCA that is occluded but fills from left collaterals. The SVG to RCA is occluded. 2. Occluded ostial circumflex but nicely patent SVG to a large OM.  3. The mid LAD is sub-totally occluded in the mid section with patent stent in the very proximal LAD. The LIMA to LAD is known to be atretic.  4. Patent SVG to a medium size diagonal that partially backfills the LAD.  5. Occluded right subclavian and occluded right iliac.  6. Today's study is unchanged from the report of her last cath with intervention from 2021...   - Likely cause of flash pulmonary edema is hypertension.      TTE 04/25/23 ([facility]): MILD LV DYSFUNCTION. EF: 45% (Estimated)  Calc.EF: 46%. MODERATE LVH.  ELEVATED LA PRESSURES WITH DIASTOLIC DYSFUNCTION  NORMAL RIGHT VENTRICULAR SYSTOLIC FUNCTION  VALVULAR REGURGITATION: MODERATE MR, TRIVIAL PR, TRIVIAL TR  NO VALVULAR STENOSIS  POOR SOUND TRANSMISSION  MILD TO MODERATE MR WITH EROA = 0.1cm^2 and VC = 0.3cm   )        Anesthesia Quick Evaluation

## 2023-12-12 NOTE — Telephone Encounter (Signed)
 Deanna [name] accepted the referral for [facility] [name] was notified.  No further ED RNCM needs identified.

## 2023-12-13 ENCOUNTER — Other Ambulatory Visit: Payer: Self-pay

## 2023-12-13 ENCOUNTER — Encounter (HOSPITAL_COMMUNITY)
Admission: RE | Disposition: A | Discharge status: Home / Self Care | Payer: Self-pay | Source: Home / Self Care | Attending: Surgery

## 2023-12-13 ENCOUNTER — Inpatient Hospital Stay (HOSPITAL_COMMUNITY)

## 2023-12-13 ENCOUNTER — Inpatient Hospital Stay (HOSPITAL_COMMUNITY)
Admission: RE | Admit: 2023-12-13 | Discharge: 2023-12-14 | DRG: 038 | Disposition: A | Discharge status: Home Health | Attending: Surgery | Admitting: Surgery

## 2023-12-13 ENCOUNTER — Encounter (HOSPITAL_COMMUNITY): Payer: Self-pay | Admitting: Surgery

## 2023-12-13 DIAGNOSIS — M25512 Pain in left shoulder: Secondary | ICD-10-CM | POA: Diagnosis present

## 2023-12-13 DIAGNOSIS — E039 Hypothyroidism, unspecified: Secondary | ICD-10-CM | POA: Diagnosis present

## 2023-12-13 DIAGNOSIS — I739 Peripheral vascular disease, unspecified: Secondary | ICD-10-CM | POA: Diagnosis present

## 2023-12-13 DIAGNOSIS — N184 Chronic kidney disease, stage 4 (severe): Secondary | ICD-10-CM | POA: Diagnosis present

## 2023-12-13 DIAGNOSIS — I5042 Chronic combined systolic (congestive) and diastolic (congestive) heart failure: Secondary | ICD-10-CM | POA: Diagnosis present

## 2023-12-13 DIAGNOSIS — I2510 Atherosclerotic heart disease of native coronary artery without angina pectoris: Secondary | ICD-10-CM | POA: Diagnosis present

## 2023-12-13 DIAGNOSIS — H9190 Unspecified hearing loss, unspecified ear: Secondary | ICD-10-CM | POA: Diagnosis present

## 2023-12-13 DIAGNOSIS — Z8619 Personal history of other infectious and parasitic diseases: Secondary | ICD-10-CM

## 2023-12-13 DIAGNOSIS — G4733 Obstructive sleep apnea (adult) (pediatric): Secondary | ICD-10-CM | POA: Diagnosis present

## 2023-12-13 DIAGNOSIS — F0390 Unspecified dementia without behavioral disturbance: Secondary | ICD-10-CM | POA: Diagnosis present

## 2023-12-13 DIAGNOSIS — H409 Unspecified glaucoma: Secondary | ICD-10-CM | POA: Diagnosis present

## 2023-12-13 DIAGNOSIS — Z79890 Hormone replacement therapy (postmenopausal): Secondary | ICD-10-CM

## 2023-12-13 DIAGNOSIS — Z885 Allergy status to narcotic agent status: Secondary | ICD-10-CM

## 2023-12-13 DIAGNOSIS — D62 Acute posthemorrhagic anemia: Secondary | ICD-10-CM | POA: Diagnosis not present

## 2023-12-13 DIAGNOSIS — Z87820 Personal history of traumatic brain injury: Secondary | ICD-10-CM | POA: Diagnosis not present

## 2023-12-13 DIAGNOSIS — Z888 Allergy status to other drugs, medicaments and biological substances status: Secondary | ICD-10-CM

## 2023-12-13 DIAGNOSIS — I5032 Chronic diastolic (congestive) heart failure: Secondary | ICD-10-CM

## 2023-12-13 DIAGNOSIS — E782 Mixed hyperlipidemia: Secondary | ICD-10-CM | POA: Diagnosis present

## 2023-12-13 DIAGNOSIS — K219 Gastro-esophageal reflux disease without esophagitis: Secondary | ICD-10-CM | POA: Diagnosis present

## 2023-12-13 DIAGNOSIS — I130 Hypertensive heart and chronic kidney disease with heart failure and stage 1 through stage 4 chronic kidney disease, or unspecified chronic kidney disease: Secondary | ICD-10-CM | POA: Diagnosis present

## 2023-12-13 DIAGNOSIS — I110 Hypertensive heart disease with heart failure: Secondary | ICD-10-CM | POA: Diagnosis not present

## 2023-12-13 DIAGNOSIS — Z7902 Long term (current) use of antithrombotics/antiplatelets: Secondary | ICD-10-CM

## 2023-12-13 DIAGNOSIS — I6521 Occlusion and stenosis of right carotid artery: Secondary | ICD-10-CM

## 2023-12-13 DIAGNOSIS — Z87440 Personal history of urinary (tract) infections: Secondary | ICD-10-CM

## 2023-12-13 DIAGNOSIS — Z79899 Other long term (current) drug therapy: Secondary | ICD-10-CM

## 2023-12-13 DIAGNOSIS — Z860100 Personal history of colon polyps, unspecified: Secondary | ICD-10-CM

## 2023-12-13 DIAGNOSIS — I252 Old myocardial infarction: Secondary | ICD-10-CM | POA: Diagnosis not present

## 2023-12-13 DIAGNOSIS — I480 Paroxysmal atrial fibrillation: Secondary | ICD-10-CM | POA: Diagnosis present

## 2023-12-13 DIAGNOSIS — I25118 Atherosclerotic heart disease of native coronary artery with other forms of angina pectoris: Secondary | ICD-10-CM

## 2023-12-13 DIAGNOSIS — I6523 Occlusion and stenosis of bilateral carotid arteries: Principal | ICD-10-CM | POA: Diagnosis present

## 2023-12-13 DIAGNOSIS — I509 Heart failure, unspecified: Secondary | ICD-10-CM

## 2023-12-13 DIAGNOSIS — N1832 Chronic kidney disease, stage 3b: Secondary | ICD-10-CM | POA: Diagnosis not present

## 2023-12-13 DIAGNOSIS — F32A Depression, unspecified: Secondary | ICD-10-CM | POA: Diagnosis present

## 2023-12-13 DIAGNOSIS — G8929 Other chronic pain: Secondary | ICD-10-CM | POA: Diagnosis present

## 2023-12-13 DIAGNOSIS — F411 Generalized anxiety disorder: Secondary | ICD-10-CM | POA: Diagnosis present

## 2023-12-13 DIAGNOSIS — Z886 Allergy status to analgesic agent status: Secondary | ICD-10-CM

## 2023-12-13 DIAGNOSIS — Z884 Allergy status to anesthetic agent status: Secondary | ICD-10-CM

## 2023-12-13 DIAGNOSIS — Z8701 Personal history of pneumonia (recurrent): Secondary | ICD-10-CM

## 2023-12-13 DIAGNOSIS — Z7982 Long term (current) use of aspirin: Secondary | ICD-10-CM

## 2023-12-13 HISTORY — PX: ENDARTERECTOMY: SHX5162

## 2023-12-13 HISTORY — PX: PATCH ANGIOPLASTY: SHX6230

## 2023-12-13 LAB — PROTIME-INR
INR: 1.1 (ref 0.8–1.2)
Prothrombin Time: 14.1 s (ref 11.4–15.2)

## 2023-12-13 LAB — TYPE AND SCREEN
ABO/RH(D): O POS
Antibody Screen: NEGATIVE

## 2023-12-13 LAB — POCT ACTIVATED CLOTTING TIME
Activated Clotting Time: 308 s
Activated Clotting Time: 233 s

## 2023-12-13 LAB — CBC
HCT: 41.7 % (ref 36.0–46.0)
Hemoglobin: 13 g/dL (ref 12.0–15.0)
MCH: 28.2 pg (ref 26.0–34.0)
MCHC: 31.2 g/dL (ref 30.0–36.0)
MCV: 90.5 fL (ref 80.0–100.0)
Platelets: 172 10*3/uL (ref 150–400)
RBC: 4.61 MIL/uL (ref 3.87–5.11)
RDW: 14.2 % (ref 11.5–15.5)
WBC: 7.7 10*3/uL (ref 4.0–10.5)
nRBC: 0 % (ref 0.0–0.2)

## 2023-12-13 LAB — APTT: aPTT: 32 s (ref 24–36)

## 2023-12-13 LAB — ELECTROLYTE PANEL
Anion gap: 8 (ref 5–15)
CO2: 25 mmol/L (ref 22–32)
Chloride: 106 mmol/L (ref 98–111)
Potassium: 4.1 mmol/L (ref 3.5–5.1)
Sodium: 139 mmol/L (ref 135–145)

## 2023-12-13 LAB — CREATININE, SERUM
Creatinine, Ser: 1.25 mg/dL — ABNORMAL HIGH (ref 0.44–1.00)
GFR, Estimated: 43 mL/min — ABNORMAL LOW (ref >60)

## 2023-12-13 SURGERY — ENDARTERECTOMY, CAROTID
Anesthesia: General | Site: Neck | Laterality: Right

## 2023-12-13 MED ORDER — ASPIRIN 81 MG PO TBEC
81.0000 mg | DELAYED_RELEASE_TABLET | Freq: Every day | ORAL | Status: DC
  Administered 2023-12-13: 81 mg via ORAL
  Filled 2023-12-13: qty 1

## 2023-12-13 MED ORDER — SUGAMMADEX SODIUM 200 MG/2ML IV SOLN
INTRAVENOUS | Status: DC | PRN
  Administered 2023-12-13: 200 mg via INTRAVENOUS

## 2023-12-13 MED ORDER — HYDRALAZINE HCL 20 MG/ML IJ SOLN: 5.0000 mg | INTRAMUSCULAR | Status: DC | PRN

## 2023-12-13 MED ORDER — FUROSEMIDE 40 MG PO TABS
40.0000 mg | ORAL_TABLET | Freq: Every day | ORAL | Status: DC | PRN
Start: 2023-12-13 — End: 2023-12-14

## 2023-12-13 MED ORDER — PROPOFOL 10 MG/ML IV BOLUS
INTRAVENOUS | Status: DC | PRN
  Administered 2023-12-13: 100 mg via INTRAVENOUS
  Administered 2023-12-13: 30 mg via INTRAVENOUS
  Administered 2023-12-13 (×2): 20 mg via INTRAVENOUS

## 2023-12-13 MED ORDER — ATORVASTATIN CALCIUM 80 MG PO TABS
80.0000 mg | ORAL_TABLET | Freq: Every day | ORAL | Status: DC
  Administered 2023-12-13: 80 mg via ORAL
  Filled 2023-12-13: qty 1

## 2023-12-13 MED ORDER — LACTATED RINGERS IV SOLN: INTRAVENOUS | Status: DC

## 2023-12-13 MED ORDER — ROCURONIUM BROMIDE 10 MG/ML (PF) SYRINGE
PREFILLED_SYRINGE | INTRAVENOUS | Status: AC
  Filled 2023-12-13: qty 10

## 2023-12-13 MED ORDER — GUAIFENESIN-DM 100-10 MG/5ML PO SYRP: 15.0000 mL | ORAL_SOLUTION | ORAL | Status: DC | PRN

## 2023-12-13 MED ORDER — MORPHINE SULFATE (PF) 2 MG/ML IV SOLN
2.0000 mg | INTRAVENOUS | Status: DC | PRN
  Administered 2023-12-13 (×2): 2 mg via INTRAVENOUS
  Filled 2023-12-13 (×2): qty 1

## 2023-12-13 MED ORDER — DORZOLAMIDE HCL 2 % OP SOLN
1.0000 [drp] | Freq: Two times a day (BID) | OPHTHALMIC | Status: DC
Start: 2023-12-13 — End: 2023-12-14
  Administered 2023-12-13 – 2023-12-14 (×2): 1 [drp] via OPHTHALMIC
  Filled 2023-12-13: qty 10

## 2023-12-13 MED ORDER — PHENOL 1.4 % MT LIQD: 1.0000 | OROMUCOSAL | Status: DC | PRN

## 2023-12-13 MED ORDER — ONDANSETRON HCL 4 MG/2ML IJ SOLN: 4.0000 mg | Freq: Four times a day (QID) | INTRAMUSCULAR | Status: DC | PRN

## 2023-12-13 MED ORDER — SODIUM CHLORIDE 0.9 % IV SOLN: INTRAVENOUS | Status: DC

## 2023-12-13 MED ORDER — PHENYLEPHRINE HCL-NACL 20-0.9 MG/250ML-% IV SOLN
INTRAVENOUS | Status: DC | PRN
  Administered 2023-12-13: 20 ug/min via INTRAVENOUS

## 2023-12-13 MED ORDER — ALUM & MAG HYDROXIDE-SIMETH 200-200-20 MG/5ML PO SUSP: 15.0000 mL | ORAL | Status: DC | PRN

## 2023-12-13 MED ORDER — LIDOCAINE HCL (PF) 1 % IJ SOLN
INTRAMUSCULAR | Status: AC
  Filled 2023-12-13: qty 30

## 2023-12-13 MED ORDER — ONDANSETRON HCL 4 MG/2ML IJ SOLN: 4.0000 mg | Freq: Once | INTRAMUSCULAR | Status: DC | PRN

## 2023-12-13 MED ORDER — GABAPENTIN 100 MG PO CAPS
200.0000 mg | ORAL_CAPSULE | Freq: Two times a day (BID) | ORAL | Status: DC
  Administered 2023-12-13 – 2023-12-14 (×3): 200 mg via ORAL
  Filled 2023-12-13 (×3): qty 2

## 2023-12-13 MED ORDER — ACETAMINOPHEN 325 MG PO TABS: 325.0000 mg | ORAL_TABLET | ORAL | Status: DC | PRN

## 2023-12-13 MED ORDER — POLYETHYLENE GLYCOL 3350 17 G PO PACK: 17.0000 g | PACK | Freq: Every day | ORAL | Status: DC | PRN

## 2023-12-13 MED ORDER — CEFAZOLIN SODIUM-DEXTROSE 2-4 GM/100ML-% IV SOLN
2.0000 g | INTRAVENOUS | Status: AC
  Administered 2023-12-13: 2 g via INTRAVENOUS
  Filled 2023-12-13: qty 100

## 2023-12-13 MED ORDER — ONDANSETRON HCL 4 MG/2ML IJ SOLN
INTRAMUSCULAR | Status: DC | PRN
  Administered 2023-12-13: 4 mg via INTRAVENOUS

## 2023-12-13 MED ORDER — METOPROLOL SUCCINATE ER 25 MG PO TB24
25.0000 mg | ORAL_TABLET | Freq: Every day | ORAL | Status: DC
  Filled 2023-12-13 (×2): qty 1

## 2023-12-13 MED ORDER — PROPOFOL 10 MG/ML IV BOLUS
INTRAVENOUS | Status: AC
  Filled 2023-12-13: qty 20

## 2023-12-13 MED ORDER — CEFAZOLIN SODIUM-DEXTROSE 2-4 GM/100ML-% IV SOLN
2.0000 g | Freq: Three times a day (TID) | INTRAVENOUS | Status: AC
  Administered 2023-12-13 – 2023-12-14 (×2): 2 g via INTRAVENOUS
  Filled 2023-12-13 (×2): qty 100

## 2023-12-13 MED ORDER — SODIUM CHLORIDE 0.9 % IV SOLN: 500.0000 mL | Freq: Once | INTRAVENOUS | Status: DC | PRN

## 2023-12-13 MED ORDER — FENTANYL CITRATE (PF) 100 MCG/2ML IJ SOLN
INTRAMUSCULAR | Status: AC
  Administered 2023-12-13: 25 ug via INTRAVENOUS
  Filled 2023-12-13: qty 2

## 2023-12-13 MED ORDER — CHLORHEXIDINE GLUCONATE 0.12 % MT SOLN
15.0000 mL | Freq: Once | OROMUCOSAL | Status: AC
  Administered 2023-12-13: 15 mL via OROMUCOSAL
  Filled 2023-12-13: qty 15

## 2023-12-13 MED ORDER — HEPARIN 6000 UNIT IRRIGATION SOLUTION
Status: DC | PRN
  Administered 2023-12-13: 1

## 2023-12-13 MED ORDER — ALBUTEROL SULFATE (2.5 MG/3ML) 0.083% IN NEBU: 3.0000 mL | INHALATION_SOLUTION | Freq: Four times a day (QID) | RESPIRATORY_TRACT | Status: DC | PRN

## 2023-12-13 MED ORDER — SODIUM CHLORIDE 0.9 % IV SOLN: INTRAVENOUS | Status: AC

## 2023-12-13 MED ORDER — HEPARIN SODIUM (PORCINE) 5000 UNIT/ML IJ SOLN
5000.0000 [IU] | Freq: Three times a day (TID) | INTRAMUSCULAR | Status: DC
  Administered 2023-12-14: 5000 [IU] via SUBCUTANEOUS
  Filled 2023-12-13: qty 1

## 2023-12-13 MED ORDER — ACETAMINOPHEN 325 MG RE SUPP: 325.0000 mg | RECTAL | Status: DC | PRN

## 2023-12-13 MED ORDER — HEPARIN SODIUM (PORCINE) 1000 UNIT/ML IJ SOLN
INTRAMUSCULAR | Status: DC | PRN
  Administered 2023-12-13: 1000 [IU] via INTRAVENOUS
  Administered 2023-12-13: 7500 [IU] via INTRAVENOUS

## 2023-12-13 MED ORDER — DIVALPROEX SODIUM ER 250 MG PO TB24
250.0000 mg | ORAL_TABLET | Freq: Every day | ORAL | Status: DC
  Administered 2023-12-13: 250 mg via ORAL
  Filled 2023-12-13 (×2): qty 1

## 2023-12-13 MED ORDER — MAGNESIUM SULFATE 2 GM/50ML IV SOLN: 2.0000 g | Freq: Every day | INTRAVENOUS | Status: DC | PRN

## 2023-12-13 MED ORDER — CHLORHEXIDINE GLUCONATE CLOTH 2 % EX PADS: 6.0000 | MEDICATED_PAD | Freq: Once | CUTANEOUS | Status: DC

## 2023-12-13 MED ORDER — SURGIFLO WITH THROMBIN (HEMOSTATIC MATRIX KIT) OPTIME
TOPICAL | Status: DC | PRN
Start: 2023-12-13 — End: 2023-12-13
  Administered 2023-12-13: 1 via TOPICAL

## 2023-12-13 MED ORDER — PHENYLEPHRINE 80 MCG/ML (10ML) SYRINGE FOR IV PUSH (FOR BLOOD PRESSURE SUPPORT)
PREFILLED_SYRINGE | INTRAVENOUS | Status: AC
  Filled 2023-12-13: qty 10

## 2023-12-13 MED ORDER — TRAMADOL HCL 50 MG PO TABS
50.0000 mg | ORAL_TABLET | Freq: Four times a day (QID) | ORAL | Status: DC | PRN
  Administered 2023-12-14: 50 mg via ORAL
  Filled 2023-12-13: qty 1

## 2023-12-13 MED ORDER — HEPARIN 6000 UNIT IRRIGATION SOLUTION
Status: AC
  Filled 2023-12-13: qty 500

## 2023-12-13 MED ORDER — EZETIMIBE 10 MG PO TABS
10.0000 mg | ORAL_TABLET | Freq: Every day | ORAL | Status: DC
  Administered 2023-12-13 – 2023-12-14 (×2): 10 mg via ORAL
  Filled 2023-12-13 (×2): qty 1

## 2023-12-13 MED ORDER — EPHEDRINE SULFATE-NACL 50-0.9 MG/10ML-% IV SOSY
PREFILLED_SYRINGE | INTRAVENOUS | Status: DC | PRN
  Administered 2023-12-13: 5 mg via INTRAVENOUS

## 2023-12-13 MED ORDER — LEVOTHYROXINE SODIUM 175 MCG PO TABS
175.0000 ug | ORAL_TABLET | Freq: Every day | ORAL | Status: DC
  Administered 2023-12-14: 175 ug via ORAL
  Filled 2023-12-13 (×2): qty 1

## 2023-12-13 MED ORDER — BUSPIRONE HCL 15 MG PO TABS
15.0000 mg | ORAL_TABLET | Freq: Two times a day (BID) | ORAL | Status: DC
  Administered 2023-12-13 – 2023-12-14 (×3): 15 mg via ORAL
  Filled 2023-12-13: qty 3
  Filled 2023-12-13: qty 1
  Filled 2023-12-13: qty 3
  Filled 2023-12-13 (×2): qty 1
  Filled 2023-12-13: qty 3
  Filled 2023-12-13: qty 1

## 2023-12-13 MED ORDER — BISACODYL 5 MG PO TBEC: 5.0000 mg | DELAYED_RELEASE_TABLET | Freq: Every day | ORAL | Status: DC | PRN

## 2023-12-13 MED ORDER — MIDAZOLAM HCL 2 MG/2ML IJ SOLN
INTRAMUSCULAR | Status: DC | PRN
  Administered 2023-12-13 (×2): 1 mg via INTRAVENOUS

## 2023-12-13 MED ORDER — PHENYLEPHRINE 80 MCG/ML (10ML) SYRINGE FOR IV PUSH (FOR BLOOD PRESSURE SUPPORT)
PREFILLED_SYRINGE | INTRAVENOUS | Status: DC | PRN
  Administered 2023-12-13 (×3): 80 ug via INTRAVENOUS
  Administered 2023-12-13: 120 ug via INTRAVENOUS
  Administered 2023-12-13 (×2): 80 ug via INTRAVENOUS
  Administered 2023-12-13: 160 ug via INTRAVENOUS
  Administered 2023-12-13: 40 ug via INTRAVENOUS
  Administered 2023-12-13: 160 ug via INTRAVENOUS
  Administered 2023-12-13: 80 ug via INTRAVENOUS
  Administered 2023-12-13: 40 ug via INTRAVENOUS
  Administered 2023-12-13: 160 ug via INTRAVENOUS
  Administered 2023-12-13: 80 ug via INTRAVENOUS
  Administered 2023-12-13: 40 ug via INTRAVENOUS
  Administered 2023-12-13: 80 ug via INTRAVENOUS

## 2023-12-13 MED ORDER — 0.9 % SODIUM CHLORIDE (POUR BTL) OPTIME
TOPICAL | Status: DC | PRN
  Administered 2023-12-13: 1000 mL

## 2023-12-13 MED ORDER — OXYCODONE HCL 5 MG PO TABS: 5.0000 mg | ORAL_TABLET | Freq: Once | ORAL | Status: DC | PRN

## 2023-12-13 MED ORDER — ESCITALOPRAM OXALATE 20 MG PO TABS
20.0000 mg | ORAL_TABLET | Freq: Every day | ORAL | Status: DC
  Administered 2023-12-13 – 2023-12-14 (×2): 20 mg via ORAL
  Filled 2023-12-13 (×2): qty 1

## 2023-12-13 MED ORDER — DOCUSATE SODIUM 100 MG PO CAPS
100.0000 mg | ORAL_CAPSULE | Freq: Every day | ORAL | Status: DC
  Administered 2023-12-14: 100 mg via ORAL
  Filled 2023-12-13: qty 1

## 2023-12-13 MED ORDER — ALBUMIN HUMAN 5 % IV SOLN
INTRAVENOUS | Status: DC | PRN
Start: 2023-12-13 — End: 2023-12-13

## 2023-12-13 MED ORDER — MIDAZOLAM HCL 2 MG/2ML IJ SOLN
INTRAMUSCULAR | Status: AC
  Filled 2023-12-13: qty 2

## 2023-12-13 MED ORDER — CLOPIDOGREL BISULFATE 75 MG PO TABS: 75.0000 mg | ORAL_TABLET | Freq: Every day | ORAL | Status: DC

## 2023-12-13 MED ORDER — ALPRAZOLAM 0.5 MG PO TABS
0.5000 mg | ORAL_TABLET | Freq: Every day | ORAL | Status: DC | PRN
Start: 2023-12-13 — End: 2023-12-14
  Administered 2023-12-13 – 2023-12-14 (×2): 0.5 mg via ORAL
  Filled 2023-12-13 (×2): qty 1

## 2023-12-13 MED ORDER — LIDOCAINE 2% (20 MG/ML) 5 ML SYRINGE
INTRAMUSCULAR | Status: DC | PRN
Start: 2023-12-13 — End: 2023-12-13

## 2023-12-13 MED ORDER — FENTANYL CITRATE (PF) 100 MCG/2ML IJ SOLN
25.0000 ug | INTRAMUSCULAR | Status: DC | PRN
  Administered 2023-12-13 (×3): 25 ug via INTRAVENOUS

## 2023-12-13 MED ORDER — PROTAMINE SULFATE 10 MG/ML IV SOLN
INTRAVENOUS | Status: DC | PRN
  Administered 2023-12-13: 50 mg via INTRAVENOUS

## 2023-12-13 MED ORDER — FENTANYL CITRATE (PF) 250 MCG/5ML IJ SOLN
INTRAMUSCULAR | Status: DC | PRN
  Administered 2023-12-13: 25 ug via INTRAVENOUS
  Administered 2023-12-13: 75 ug via INTRAVENOUS
  Administered 2023-12-13 (×3): 50 ug via INTRAVENOUS

## 2023-12-13 MED ORDER — ROCURONIUM BROMIDE 10 MG/ML (PF) SYRINGE
PREFILLED_SYRINGE | INTRAVENOUS | Status: DC | PRN
  Administered 2023-12-13: 20 mg via INTRAVENOUS
  Administered 2023-12-13: 60 mg via INTRAVENOUS
  Administered 2023-12-13: 10 mg via INTRAVENOUS

## 2023-12-13 MED ORDER — ORAL CARE MOUTH RINSE: 15.0000 mL | Freq: Once | OROMUCOSAL | Status: AC

## 2023-12-13 MED ORDER — POTASSIUM CHLORIDE CRYS ER 20 MEQ PO TBCR: 20.0000 meq | EXTENDED_RELEASE_TABLET | Freq: Every day | ORAL | Status: DC | PRN

## 2023-12-13 MED ORDER — METOPROLOL TARTRATE 5 MG/5ML IV SOLN: 2.0000 mg | INTRAVENOUS | Status: DC | PRN

## 2023-12-13 MED ORDER — LABETALOL HCL 5 MG/ML IV SOLN: 10.0000 mg | INTRAVENOUS | Status: DC | PRN

## 2023-12-13 MED ORDER — FENTANYL CITRATE (PF) 250 MCG/5ML IJ SOLN
INTRAMUSCULAR | Status: AC
  Filled 2023-12-13: qty 5

## 2023-12-13 MED ORDER — ONDANSETRON HCL 4 MG/2ML IJ SOLN
INTRAMUSCULAR | Status: AC
  Filled 2023-12-13: qty 2

## 2023-12-13 MED ORDER — DEXAMETHASONE SODIUM PHOSPHATE 10 MG/ML IJ SOLN
INTRAMUSCULAR | Status: DC | PRN
  Administered 2023-12-13: 10 mg via INTRAVENOUS

## 2023-12-13 MED ORDER — LIDOCAINE 2% (20 MG/ML) 5 ML SYRINGE
INTRAMUSCULAR | Status: AC
  Filled 2023-12-13: qty 5

## 2023-12-13 MED ORDER — OXYCODONE HCL 5 MG/5ML PO SOLN: 5.0000 mg | Freq: Once | ORAL | Status: DC | PRN

## 2023-12-13 MED ORDER — PANTOPRAZOLE SODIUM 40 MG PO TBEC
40.0000 mg | DELAYED_RELEASE_TABLET | Freq: Every day | ORAL | Status: DC
  Administered 2023-12-14: 40 mg via ORAL
  Filled 2023-12-13: qty 1

## 2023-12-13 MED ORDER — MEPERIDINE HCL 25 MG/ML IJ SOLN: 6.2500 mg | INTRAMUSCULAR | Status: DC | PRN

## 2023-12-13 SURGICAL SUPPLY — 40 items
BAG COUNTER SPONGE SURGICOUNT (BAG) ×2 IMPLANT
CANISTER SUCT 3000ML PPV (MISCELLANEOUS) ×2 IMPLANT
CATH ROBINSON RED A/P 18FR (CATHETERS) ×2 IMPLANT
CATH SUCT 10FR WHISTLE TIP (CATHETERS) ×2 IMPLANT
CLIP TI MEDIUM 6 (CLIP) ×2 IMPLANT
CLIP TI WIDE RED SMALL 6 (CLIP) ×2 IMPLANT
COVER PROBE W GEL 5X96 (DRAPES) IMPLANT
DERMABOND ADVANCED .7 DNX12 (GAUZE/BANDAGES/DRESSINGS) ×2 IMPLANT
DRAIN CHANNEL 15F RND FF W/TCR (WOUND CARE) IMPLANT
ELECTRODE REM PT RTRN 9FT ADLT (ELECTROSURGICAL) ×2 IMPLANT
EVACUATOR SILICONE 100CC (DRAIN) IMPLANT
GLOVE SURG SS PI 7.5 STRL IVOR (GLOVE) ×6 IMPLANT
GOWN STRL REUS W/ TWL LRG LVL3 (GOWN DISPOSABLE) ×4 IMPLANT
GOWN STRL REUS W/ TWL XL LVL3 (GOWN DISPOSABLE) ×2 IMPLANT
HEMOSTAT SNOW SURGICEL 2X4 (HEMOSTASIS) IMPLANT
INSERT FOGARTY SM (MISCELLANEOUS) IMPLANT
KIT BASIN OR (CUSTOM PROCEDURE TRAY) ×2 IMPLANT
KIT SHUNT ARGYLE CAROTID ART 6 (VASCULAR PRODUCTS) IMPLANT
KIT TURNOVER KIT B (KITS) ×2 IMPLANT
NDL HYPO 25GX1X1/2 BEV (NEEDLE) IMPLANT
NEEDLE HYPO 25GX1X1/2 BEV (NEEDLE) IMPLANT
NS IRRIG 1000ML POUR BTL (IV SOLUTION) ×6 IMPLANT
PACK CAROTID (CUSTOM PROCEDURE TRAY) ×2 IMPLANT
PAD ARMBOARD POSITIONER FOAM (MISCELLANEOUS) ×4 IMPLANT
PATCH VASC XENOSURE 1X6 (Vascular Products) IMPLANT
POSITIONER HEAD DONUT 9IN (MISCELLANEOUS) ×2 IMPLANT
SET WALTER ACTIVATION W/DRAPE (SET/KITS/TRAYS/PACK) IMPLANT
SHUNT CAROTID BYPASS 10 (VASCULAR PRODUCTS) IMPLANT
SHUNT CAROTID BYPASS 12FRX15.5 (VASCULAR PRODUCTS) IMPLANT
SPONGE T-LAP 18X18 ~~LOC~~+RFID (SPONGE) IMPLANT
SURGIFLO W/THROMBIN 8M KIT (HEMOSTASIS) IMPLANT
SUT ETHILON 3 0 PS 1 (SUTURE) IMPLANT
SUT PROLENE 6 0 BV (SUTURE) ×4 IMPLANT
SUT PROLENE 7 0 BV1 MDA (SUTURE) IMPLANT
SUT SILK 3-0 18XBRD TIE 12 (SUTURE) IMPLANT
SUT VIC AB 3-0 SH 27X BRD (SUTURE) ×4 IMPLANT
SUT VIC AB 3-0 X1 27 (SUTURE) ×2 IMPLANT
SYR CONTROL 10ML LL (SYRINGE) IMPLANT
TOWEL GREEN STERILE (TOWEL DISPOSABLE) ×2 IMPLANT
WATER STERILE IRR 1000ML POUR (IV SOLUTION) ×2 IMPLANT

## 2023-12-13 NOTE — Anesthesia Postprocedure Evaluation (Signed)
 Anesthesia Post Note  Patient: ADDASYN HELSEL  Procedure(s) Performed: ENDARTERECTOMY, CAROTID (Right) ANGIOPLASTY, USING PATCH GRAFT (Right: Neck)     Patient location during evaluation: PACU Anesthesia Type: General Level of consciousness: awake and alert Pain management: pain level controlled Vital Signs Assessment: post-procedure vital signs reviewed and stable Respiratory status: spontaneous breathing, nonlabored ventilation, respiratory function stable and patient connected to nasal cannula oxygen  Cardiovascular status: blood pressure returned to baseline and stable Postop Assessment: no apparent nausea or vomiting Anesthetic complications: no   No notable events documented.  Last Vitals:  Vitals:   12/13/23 1824 12/13/23 1937  BP: (!) 89/58 107/83  Pulse:  69  Resp: 17 20  Temp:  36.6 C  SpO2:  99%    Last Pain:  Vitals:   12/13/23 2049  TempSrc:   PainSc: 7                  Meesha Sek

## 2023-12-13 NOTE — Anesthesia Procedure Notes (Addendum)
 Arterial Line Insertion Start/End5/02/2024 7:40 AM, 12/13/2023 8:00 AM Performed by: Rhenda Cedars, MD, Candance Certain, CRNA, CRNA  Patient location: Pre-op. Preanesthetic checklist: patient identified, IV checked, site marked, risks and benefits discussed, surgical consent, monitors and equipment checked, pre-op evaluation, timeout performed and anesthesia consent Lidocaine  1% used for infiltration Left, radial was placed Catheter size: 20 G Hand hygiene performed   Attempts: 3 Procedure performed without using ultrasound guided technique. Following insertion, dressing applied and Biopatch. Post procedure assessment: normal  Post procedure complications: unsuccessful attempts. Patient tolerated the procedure well with no immediate complications. Additional procedure comments: R RADIAL attempt x2 with ultrasound (SRNA x1, CRNA x1); Successful L RADIAL attempt x1 by SRNA (ultrasound not used).

## 2023-12-13 NOTE — Anesthesia Procedure Notes (Signed)
 Procedure Name: Intubation Date/Time: 12/13/2023 7:59 AM  Performed by: Candance Certain, CRNAPre-anesthesia Checklist: Patient identified, Emergency Drugs available, Suction available and Patient being monitored Patient Re-evaluated:Patient Re-evaluated prior to induction Oxygen  Delivery Method: Circle System Utilized Preoxygenation: Pre-oxygenation with 100% oxygen  Induction Type: IV induction Ventilation: Mask ventilation without difficulty Laryngoscope Size: [name] and 2 Grade View: Grade II Tube type: Oral Tube size: 7.0 mm Number of attempts: 1 Airway Equipment and Method: Stylet and Oral airway Placement Confirmation: ETT inserted through vocal cords under direct vision, positive ETCO2 and breath sounds checked- equal and bilateral Secured at: 21 cm Tube secured with: Tape Dental Injury: Teeth and Oropharynx as per pre-operative assessment

## 2023-12-13 NOTE — Interval H&P Note (Signed)
 History and Physical Interval Note:  12/13/2023 8:31 AM  Deanna [name]  has presented today for surgery, with the diagnosis of carotid stenosis bilateral.  The various methods of treatment have been discussed with the patient and family. After consideration of risks, benefits and other options for treatment, the patient has consented to  Procedure(s): ENDARTERECTOMY, CAROTID (Right) as a surgical intervention.  The patient's history has been reviewed, patient examined, no change in status, stable for surgery.  I have reviewed the patient's chart and labs.  Questions were answered to the patient's satisfaction.     Gareld June

## 2023-12-13 NOTE — Transfer of Care (Signed)
 Immediate Anesthesia Transfer of Care Note  Patient: Deanna [name]  Procedure(s) Performed: ENDARTERECTOMY, CAROTID (Right) ANGIOPLASTY, USING PATCH GRAFT (Right: Neck)  Patient Location: PACU  Anesthesia Type:General  Level of Consciousness: awake and alert   Airway & Oxygen  Therapy: Patient Spontanous Breathing and Patient connected to face mask oxygen   Post-op Assessment: Report given to RN and Post -op Vital signs reviewed and stable  Post vital signs: Reviewed and stable  Last Vitals:  Vitals Value Taken Time  BP 112/70 12/13/23 1200  Temp    Pulse 83 12/13/23 1200  Resp 13 12/13/23 1200  SpO2 95 % 12/13/23 1200  Vitals shown include unfiled device data.  Last Pain:  Vitals:   12/13/23 0714  TempSrc:   PainSc: 0-No pain         Complications: No notable events documented.

## 2023-12-13 NOTE — Plan of Care (Signed)

## 2023-12-13 NOTE — Op Note (Signed)
 Patient name: CARETTA TWEDT MRN: [id number] DOB: July 02, 1941 Sex: female  12/13/2023 Pre-operative Diagnosis: Asymptomatic   right carotid stenosis Post-operative diagnosis:  Same Surgeon:  Gareld June Assistants:  Margaret Sharp, PA Procedure:    right carotid Endarterectomy with bovine pericardial patch angioplasty Anesthesia:  General Blood Loss:  minimal Specimens:  none  Findings:  90 %stenosis; Thrombus:  none  Indications: This is an 83 year old female who was found to have a greater than 80% right carotid stenosis on ultrasound which is asymptomatic.  Her procedure was rescheduled twice because of a urine infection.  This is now cleared and she comes in today for repair.  Procedure:  The patient was identified in the holding area and taken to [facility] OR ROOM 12  The patient was then placed supine on the table.   General endotrachial anesthesia was administered.  The patient was prepped and draped in the usual sterile fashion.  A time out was called and antibiotics were administered.  A PA was necessary to expedite the procedure and assist with technical details.  She help with exposure by providing suction and retraction.  She help with the anastomosis by following the suture.  The incision was made along the anterior border of the right sternocleidomastoid muscle.  Cautery was used to dissect through the subcutaneous tissue.  The platysma muscle was divided with cautery.  The internal jugular vein was exposed along its anterior medial border.  The common facial vein was exposed and then divided between 2-0 silk ties and metal clips.  The common carotid artery was then circumferentially exposed and encircled with an umbilical tape.  The vagus nerve was identified and protected.  Next sharp dissection was used to expose the external carotid artery and the superior thyroid  artery.  The were encircled with a blue vessel loop and a 2-0 silk tie respectively.  Finally, the internal carotid was  carefully dissected free.  An umbilical tape was placed around the internal carotid artery distal to the diseased segment.  The hypoglossal nerve was visualized throughout and protected.  The patient was given systemic heparinization.  A bovine carotid patch was selected and prepared on the back table.  A 10 french shunt was also prepared.  After blood pressure readings were appropriate and the heparin  had been given time to circulate, the internal carotid artery was occluded with a baby Gregory clamp.  The external and common carotid arteries were then occluded with vascular clamps and the 2-0 tie tightened on the superior thyroid  artery.  A #11 blade was used to make an arteriotomy in the common carotid artery.  This was extended with Potts scissors along the anterior and lateral border of the common and internal carotid artery.  Approximately 90% stenosis was identified.  There was no thrombus identified.  The 10 french shunt was not placed, as there was adequate backbleeding.  A kleiner kuntz elevator was used to perform endarterectomy.  An eversion endarterectomy was performed in the external carotid artery.  A good distal endpoint was obtained in the internal carotid artery.  The specimen was removed and sent to pathology.  Heparinized saline was used to irrigate the endarterectomized field.  All potential embolic debris was removed.  Bovine pericardial patch angioplasty was then performed using a running 6-0 Prolene. The common internal and external carotid arteries were all appropriately flushed. The artery was again irrigated with heparin  saline.  The anastomosis was then secured. The clamp was first released on the external carotid artery  followed by the common carotid artery approximately 30 seconds later, bloodflow was reestablish through the internal carotid artery.  Next, a hand-held  Doppler was used to evaluate the signals in the common, external, and internal  carotid arteries, all of which had  appropriate signals. I then administered  50 mg protamine . The wound was then irrigated.  After hemostasis was achieved, the carotid sheath was reapproximated with 3-0 Vicryl. The  platysma muscle was reapproximated with running 3-0 Vicryl. The skin  was closed with 4-0 Vicryl. Dermabond was placed on the skin. The  patient was then successfully extubated. Her neurologic exam was  similar to his preprocedural exam. The patient was then taken to recovery room  in stable condition. There were no complications.     Disposition:  To PACU in stable condition.  Relevant Operative Details: Disease in the common carotid artery extending down to the clavicle.  This was heavily calcified.  The majority of the plaque was at the bifurcation creating greater than 90% stenosis.  I performed endarterectomy of the internal carotid bifurcation and common carotid artery down to the clavicle.  A good distal endpoint was obtained.  I placed 270 tacking sutures.  All embolic debris was removed.  I closed the majority of the common carotid artery primarily and then used a bovine patch on the internal carotid, bifurcation and distal common carotid artery.  Reinaldo Caras, M.D., [facility] [place] Office: [phone] Pager:  [phone]

## 2023-12-14 ENCOUNTER — Other Ambulatory Visit (HOSPITAL_COMMUNITY): Payer: Self-pay

## 2023-12-14 ENCOUNTER — Encounter (HOSPITAL_COMMUNITY): Payer: Self-pay | Admitting: Surgery

## 2023-12-14 LAB — LIPID PANEL
Cholesterol: 73 mg/dL (ref 0–200)
HDL: 22 mg/dL — ABNORMAL LOW (ref >40)
LDL Cholesterol: 29 mg/dL (ref 0–99)
Total CHOL/HDL Ratio: 3.3 ratio
Triglycerides: 109 mg/dL (ref <150)
VLDL: 22 mg/dL (ref 0–40)

## 2023-12-14 LAB — CBC
HCT: 34.1 % — ABNORMAL LOW (ref 36.0–46.0)
Hemoglobin: 10.6 g/dL — ABNORMAL LOW (ref 12.0–15.0)
MCH: 28.4 pg (ref 26.0–34.0)
MCHC: 31.1 g/dL (ref 30.0–36.0)
MCV: 91.4 fL (ref 80.0–100.0)
Platelets: 144 10*3/uL — ABNORMAL LOW (ref 150–400)
RBC: 3.73 MIL/uL — ABNORMAL LOW (ref 3.87–5.11)
RDW: 14.3 % (ref 11.5–15.5)
WBC: 9.6 10*3/uL (ref 4.0–10.5)
nRBC: 0 % (ref 0.0–0.2)

## 2023-12-14 LAB — BASIC METABOLIC PANEL WITH GFR
Anion gap: 7 (ref 5–15)
BUN: 12 mg/dL (ref 8–23)
CO2: 24 mmol/L (ref 22–32)
Calcium: 8.4 mg/dL — ABNORMAL LOW (ref 8.9–10.3)
Chloride: 108 mmol/L (ref 98–111)
Creatinine, Ser: 1.19 mg/dL — ABNORMAL HIGH (ref 0.44–1.00)
GFR, Estimated: 46 mL/min — ABNORMAL LOW (ref >60)
Glucose, Bld: 118 mg/dL — ABNORMAL HIGH (ref 70–99)
Potassium: 4.5 mmol/L (ref 3.5–5.1)
Sodium: 139 mmol/L (ref 135–145)

## 2023-12-14 MED ORDER — TRAMADOL HCL 50 MG PO TABS
50.0000 mg | ORAL_TABLET | Freq: Four times a day (QID) | ORAL | 0 refills | Status: DC | PRN
  Filled 2023-12-14: qty 8, 2d supply, fill #0

## 2023-12-14 MED ORDER — FUROSEMIDE 40 MG PO TABS: 40.0000 mg | ORAL_TABLET | Freq: Every day | ORAL | Status: AC | PRN

## 2023-12-14 NOTE — Progress Notes (Signed)
 Mobility Specialist Progress Note:   12/14/23 1039  Mobility  Activity Transferred from bed to chair  Level of Assistance Minimal assist, patient does 75% or more  Assistive Device Other (Comment) (HHA)  Distance Ambulated (ft) 4 ft  Activity Response Tolerated well  Mobility Referral Yes  Mobility visit 1 Mobility  Mobility Specialist Start Time (ACUTE ONLY) 1008  Mobility Specialist Stop Time (ACUTE ONLY) 1028  Mobility Specialist Time Calculation (min) (ACUTE ONLY) 20 min   Pt received in bed, confused, but agreeable to mobility session. Transferred pt B>C via MinA and HHA. C/o dizziness when sitting EOB and standing, see BP below. Tolerated well. Left pt in chair with all needs met, call bell in reach. RN notified.   Sitting EOB: 66/50 (57) Standing: 93/64 (74) Sitting in Chair: 117/75 (87)  Deanna [name] Mobility Specialist Please contact via Special educational needs teacher or  Rehab office at [phone]

## 2023-12-14 NOTE — Discharge Instructions (Signed)

## 2023-12-14 NOTE — Progress Notes (Addendum)
  Progress Note    12/14/2023 7:08 AM 1 Day Post-Op  Subjective:  no complaints; has voided.  Has not walked.  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 Lungs:  non labored Incision:  clean and dry  CBC    Component Value Date/Time   WBC 9.6 12/14/2023 0424   RBC 3.73 (L) 12/14/2023 0424   HGB 10.6 (L) 12/14/2023 0424   HGB 10.2 (L) 04/22/2020 1118   HCT 34.1 (L) 12/14/2023 0424   HCT 30.6 (L) 04/22/2020 1118   PLT 144 (L) 12/14/2023 0424   PLT 108 (L) 04/22/2020 1118   MCV 91.4 12/14/2023 0424   MCV 86 04/22/2020 1118   MCH 28.4 12/14/2023 0424   MCHC 31.1 12/14/2023 0424   RDW 14.3 12/14/2023 0424   RDW 13.5 04/22/2020 1118   LYMPHSABS 1.8 08/16/2023 1358   MONOABS 0.4 08/16/2023 1358   EOSABS 151 09/12/2023 1351   BASOSABS 42 09/12/2023 1351    BMET    Component Value Date/Time   NA 139 12/14/2023 0424   NA 143 07/14/2022 0000   K 4.5 12/14/2023 0424   CL 108 12/14/2023 0424   CO2 24 12/14/2023 0424   GLUCOSE 118 (H) 12/14/2023 0424   BUN 12 12/14/2023 0424   BUN 14 07/14/2022 0000   CREATININE 1.19 (H) 12/14/2023 0424   CREATININE 1.47 (H) 09/12/2023 1351   CALCIUM  8.4 (L) 12/14/2023 0424   GFRNONAA 46 (L) 12/14/2023 0424   GFRNONAA 30 (L) 12/07/2020 1356   GFRAA 35 (L) 12/07/2020 1356     Intake/Output Summary (Last 24 hours) at 12/14/2023 0708 Last data filed at 12/14/2023 0316 Gross per 24 hour  Intake 1915.48 ml  Output 1100 ml  Net 815.48 ml     Assessment/Plan:  This is a 83 y.o. female who is s/p right CEA  1 Day Post-Op  -pt is doing well this am. -pt neuro exam is in tact; she has a marginal mandibular neuropraxia-discussed this should improve over time.  -acute  surgical blood loss anemia-tolerating -pt has not ambulated-needs to walk in hallways.  -pt has voided -f/u with [facility] in 2-3 weeks on Dr. Charlotte Cookey clinic day   Maryanna Smart, PA-C [facility] [phone]   I agree with the above.  I have seen and evaluated the patient.  Postop day 1 from right carotid.  She is doing very well this morning.  Plan for discharge today  Gareld June

## 2023-12-14 NOTE — Progress Notes (Signed)
 Pt's nurse Regan Cao, RN reviewed pt's d/c instructions with pt's daughter, [name].  [facility] has filled scripts for pt and will be picked up on the way out for discharge. Pt will be d/c'd via wheelchair with her belongings and will be escorted by staff.   Kelce Bouton,RN SWOT

## 2023-12-14 NOTE — TOC Transition Note (Signed)
 Transition of Care [facility]) - Discharge Note Sherin Dingwall RN, BSN Transitions of Care Unit 4E- RN Case Manager See Treatment Team for direct phone #   Patient Details  Name: Deanna [name] MRN: [id number] Date of Birth: 1940/11/16  Transition of Care [facility]) CM/SW Contact:  Rox Cope, RN Phone Number: 12/14/2023, 4:09 PM   Clinical Narrative:    Pt stable for transition home today, CM received notification from [facility] that [facility] made office referral for any [facility] needs.   Pt with hx of dementia, per bedside RN confused today. CM reached out to daughter with number listed in epic- VM left- no return call at this time to discuss transition needs and [facility] referral.   [facility] liaison will also try to follow up with daughter for any [facility] needs under office referral.   Bedside RN will continue to reach daughter regarding transport home.    Final next level of care: Home w Home Health Services Barriers to Discharge: No Barriers Identified   Patient Goals and CMS Choice     Choice offered to / list presented to : Adult Children      Discharge Placement                 Home w/ [facility]      Discharge Plan and Services Additional resources added to the After Visit Summary for     Discharge Planning Services: CM Consult Post Acute Care Choice: Home Health            DME Agency: NA         HH Agency: [facility] Date [facility] Agency Contacted: 12/14/23   Representative spoke with at [facility] Agency: Charlynn Coombe  Social Drivers of Health (SDOH) Interventions SDOH Screenings   Food Insecurity: Patient Unable To Answer (12/13/2023)  Housing: Patient Unable To Answer (12/13/2023)  Transportation Needs: Patient Unable To Answer (12/13/2023)  Utilities: Patient Unable To Answer (12/13/2023)  Depression (PHQ2-9): Low Risk  (09/12/2023)  Social Connections: Patient Unable To Answer (12/13/2023)  Tobacco Use: Medium Risk (12/13/2023)     Readmission Risk Interventions     08/18/2023    2:28 PM  Readmission Risk Prevention Plan  Post Dischage Appt Complete  Medication Screening Complete  Transportation Screening Complete

## 2023-12-14 NOTE — Discharge Summary (Signed)
 Discharge Summary     Deanna [name] [birth year] 83 y.o. 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 female who is well-known to me for having undergone the following procedures: 12/15/2011: Left carotid subclavian bypass graft for dizziness and left arm numbness.  She had a seroma postoperatively which had to be surgically drained. 08/22/2017: Drug-coated balloon angioplasty, femoral-femoral bypass graft.  She has also had endovascular exclusion of a mid graft pseudoaneurysm using an 8 mm stent History of femoral-femoral bypass graft by Dr. Percy Bracken in the remote past   She was lost to follow-up and has not been seen since 2019.  She not had any vascular issues since that time.  She denies claudication.  She does not have any neurologic symptoms such as amaurosis fugax numbness or weakness in either extremity or slurred speech.  Is been hospitalized recently for shortness of breath.  She follows up with cardiology at [facility] and at [facility].  Hospital Course:  The patient was admitted to the hospital and taken to the operating room on 12/13/2023 and underwent  right carotid Endarterectomy with bovine pericardial patch angioplasty     Findings: 90 %stenosis; Thrombus:  none   The pt tolerated the procedure well and was transported to the PACU in good condition.   By POD 1, the pt neuro status was in tact.  She did have a marginal mandibular neuropraxia.  She was able to void, ambulate and take in solids without difficulty.   She was noted to be on two statins.  Her lipitor was discontinued and she will continue with Crestor .     Recent Labs    12/11/23 1246 12/13/23 0651 12/14/23 0424  NA 140 139 139  K 4.2 4.1 4.5  CL 103 106 108  CO2 23 25 24   GLUCOSE 108*  --  118*  BUN  14  --  12  CALCIUM  10.0  --  8.4*   Recent Labs    12/13/23 1518 12/14/23 0424  WBC 7.7 9.6  HGB 13.0 10.6*  HCT 41.7 34.1*  PLT 172 144*   Recent Labs    12/13/23 0651  INR 1.1     Discharge Instructions     Discharge patient   Complete by: As directed    Discharge after she has walked and had breakfast and has pain med from TOC.  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] Carotid artery stenosis, asymptomatic, right [I65.21]  Secondary Diagnosis: Patient Active Problem List   Diagnosis Date Noted   Carotid artery stenosis, asymptomatic, right 12/13/2023   Left arm pain 08/17/2023   Delirium 08/17/2023   UTI (urinary tract infection) 08/16/2023   Anxiety 04/24/2023   Acute respiratory failure with hypoxia (HCC) 06/17/2022   COVID-19 virus infection 06/17/2022   Stage 3b chronic kidney disease (CKD) (HCC) - baseline SCr 1.8 06/17/2022   PAD (peripheral artery disease) (HCC) 10/11/2021   PAF (paroxysmal atrial fibrillation) (HCC) 10/11/2021   Hypokalemia 05/09/2021   Status post coronary artery stent placement    Overactive bladder 12/16/2019   Generalized anxiety disorder 06/17/2018   Mood disorder (HCC) 06/17/2018   Chronic diastolic CHF (congestive heart failure) (HCC) 11/16/2017  Primary open angle glaucoma of both eyes, moderate stage 11/09/2017   Risk for falls 11/22/2016   Hearing disorder 11/03/2016   Spondylosis of lumbar region without myelopathy or radiculopathy 02/25/2016   History of traumatic brain injury 11/04/2015   Primary osteoarthritis of both hips 08/17/2015   Cognitive impairment 07/08/2015   OSA on CPAP 04/26/2015   Chronic pain 04/26/2015   Physical deconditioning 04/26/2015   Hyperlipidemia 07/02/2014   Carotid artery stenosis 03/26/2012   Shortness of breath 12/19/2011    Class: Acute   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, 2011 11/28/2011   History of colonic polyps 06/18/2010   Hypothyroidism 11/17/2008   Obesity, Class III, BMI 40-49.9 (morbid obesity) (HCC) 11/17/2008   ANXIETY DEPRESSION 11/17/2008   Essential hypertension 11/17/2008   HEMORRHOIDS 11/17/2008   GERD 11/17/2008   IBS 11/17/2008   Atherosclerosis of coronary artery bypass graft(s), unspecified, with other forms of angina pectoris [facility])    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   CKD (chronic kidney disease), stage IV (HCC)    Cognitive impairment    Colon polyps    Community acquired pneumonia    Depression    Diverticulosis    Dysuria    Encephalomalacia    GAD (generalized anxiety disorder)    GERD (gastroesophageal reflux disease)    Glaucoma    Hearing loss    Hemorrhoids    Hypertension    Hypothyroidism    Incontinence    Mixed hyperlipidemia    Myocardial infarction (HCC)    NSTEMI (non-ST elevated myocardial infarction) (HCC) 12/19/2011   OSA on CPAP    PAF (paroxysmal atrial fibrillation) (HCC)    Peripheral arterial disease (HCC)    Pneumonitis 04/26/2015   Pyelonephritis due to Escherichia coli 04/28/2015   Sepsis (HCC)    Spondylosis of lumbar region without myelopathy or radiculopathy    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HCC)    Urticaria    Vertigo, benign positional     Allergies as of 12/14/2023       Reactions   Lidocaine  Swelling, Other (See Comments)   Use CORRECT dose for age/weight- red welts and tongue swelling result, if not   Cymbalta  [duloxetine  Hcl] Other (See Comments)   Increased confusion and memory concerns    Donepezil Other (See Comments)   Altered  mood and Aggression    Mobic [meloxicam] Other (See Comments)   "Heart failure"- cannot take   Namenda [memantine] Other (See Comments)   Severe aggression   Nsaids Other (See Comments)   Cannot have due to heart issues   Other Other (See Comments)   No salt or anything that might cause fluid retention/swelling!!   Oxycontin  [oxycodone ] Other (See Comments)   "Toxic dementia" - daughter feels like she could take this, however(??)   Vicodin [hydrocodone-acetaminophen ] Other (See Comments)   Delirium, Confusion, and Toxic dementia        Medication List     STOP taking these medications    atorvastatin  80 MG tablet Commonly known as: LIPITOR   doxycycline  100 MG capsule Commonly known as: VIBRAMYCIN        TAKE these medications    albuterol  108 (90  Base) MCG/ACT inhaler Commonly known as: VENTOLIN  HFA Inhale 2 puffs into the lungs every 6 (six) hours as needed for wheezing or shortness of breath.   ALPRAZolam  0.5 MG tablet Commonly known as: Xanax  Take 1 tablet (0.5 mg total) by mouth daily as needed for up to 2 doses for anxiety.   aspirin  EC 81 MG tablet Take 81 mg by mouth at bedtime.   busPIRone  15 MG tablet Commonly known as: BUSPAR  Take 1 tablet (15 mg total) by mouth 2 (two) times daily.   ciprofloxacin  500 MG tablet Commonly known as: Cipro  Take 1 tablet (500 mg total) by mouth 2 (two) times daily.   clopidogrel  75 MG tablet Commonly known as: PLAVIX  TAKE 1 TABLET(75 MG) BY MOUTH DAILY   divalproex  250 MG 24 hr tablet Commonly known as: DEPAKOTE  ER Take 1 tablet (250 mg total) by mouth at bedtime.   dorzolamide  2 % ophthalmic solution Commonly known as: TRUSOPT  Place 1 drop into both eyes in the morning and at bedtime.   escitalopram  20 MG tablet Commonly known as: LEXAPRO  Take 1 tablet (20 mg total) by mouth daily.   ezetimibe  10 MG tablet Commonly known as: ZETIA  Take 1 tablet (10 mg total) by mouth daily.   furosemide  40 MG  tablet Commonly known as: LASIX  Take 1 tablet (40 mg total) by mouth daily as needed for fluid or edema. What changed: See the new instructions.   gabapentin  100 MG capsule Commonly known as: NEURONTIN  TAKE 2 CAPSULES(200 MG) BY MOUTH DAILY What changed: See the new instructions.   ketoconazole  2 % shampoo Commonly known as: NIZORAL  APPLY TOPICALLY 2 TIMES A WEEK.   levothyroxine  175 MCG tablet Commonly known as: SYNTHROID  TAKE 1 TABLET BY MOUTH EVERY DAY AT THE SAME TIME AND SEPARATE FROM ALL OTHER MEDICATIONS   lidocaine  5 % Commonly known as: Lidoderm  Place 1 patch onto the skin daily. Remove & Discard patch within 12 hours or as directed by MD   meclizine  25 MG tablet Commonly known as: ANTIVERT  Take 1 tablet (25 mg total) by mouth as needed for dizziness. What changed: when to take this   methylPREDNISolone  4 MG Tbpk tablet Commonly known as: MEDROL  DOSEPAK Take by mouth as directed.   metoprolol  succinate 25 MG 24 hr tablet Commonly known as: TOPROL -XL Take 1 tablet (25 mg total) by mouth daily.   nitrofurantoin (macrocrystal-monohydrate) 100 MG capsule Commonly known as: MACROBID Take 100 mg by mouth 2 (two) times daily.   nitroGLYCERIN  0.4 MG SL tablet Commonly known as: NITROSTAT  Place 1 tablet (0.4 mg total) under the tongue every 5 (five) minutes x 3 doses as needed for chest pain.   pantoprazole  40 MG tablet Commonly known as: PROTONIX  Take 1 tablet (40 mg total) by mouth daily.   rosuvastatin  40 MG tablet Commonly known as: CRESTOR  TAKE 1 TABLET(40 MG) BY MOUTH DAILY What changed: See the new instructions.   traMADol  50 MG tablet Commonly known as: ULTRAM  Take 1 tablet (50 mg total) by mouth every 6 (six) hours as needed for moderate pain (pain score 4-6).   TYLENOL  500 MG tablet Generic drug: acetaminophen  Take 500-1,000 mg by mouth 2 (two) times daily.   VITAMIN B-12 PO Take 1 tablet by mouth in the morning.   Vitamin D3 1.25 MG  (50000 UT) Caps Take 1 capsule by mouth once a week.         [facility] Discharge Instructions Carotid Endarterectomy (CEA)  Please refer to the following instructions for your post-procedure care. Your surgeon or physician assistant will discuss any changes with you.  Activity  You are encouraged to walk as much as you can. You can slowly return to normal activities but must avoid strenuous activity and heavy lifting until your doctor tell you it's OK. Avoid activities such as vacuuming or swinging a golf club. You can drive after one week if you are comfortable and you are no longer taking prescription pain medications. It is normal to feel tired for serval weeks after your surgery. It is also normal to have difficulty with sleep habits, eating, and bowel movements after surgery. These will go away with time.  Bathing/Showering  You may shower after you come home. Do not soak in a bathtub, hot tub, or swim until the incision heals completely.  Incision Care  Shower every day. Clean your incision with mild soap and water. Pat the area dry with a clean towel. You do not need a bandage unless otherwise instructed. Do not apply any ointments or creams to your incision. You may have skin glue on your incision. Do not peel it off. It will come off on its own in about one week. Your incision may feel thickened and raised for several weeks after your surgery. This is normal and the skin will soften over time. For Men Only: It's OK to shave around the incision but do not shave the incision itself for 2 weeks. It is common to have numbness under your chin that could last for several months.  Diet  Resume your normal diet. There are no special food restrictions following this procedure. A low fat/low cholesterol diet is recommended for all patients with vascular disease. In order to heal from your surgery, it is CRITICAL to get adequate nutrition. Your body requires  vitamins, minerals, and protein. Vegetables are the best source of vitamins and minerals. Vegetables also provide the perfect balance of protein. Processed food has little nutritional value, so try to avoid this.  Medications  Resume taking all of your medications unless your doctor or physician assistant tells you not to.  If your incision is causing pain, you may take over-the- counter pain relievers such as acetaminophen  (Tylenol ). If you were prescribed a stronger pain medication, please be aware these medications can cause nausea and constipation.  Prevent nausea by taking the medication with a snack or meal. Avoid constipation by drinking plenty of fluids and eating foods with a high amount of fiber, such as fruits, vegetables, and grains.  Discontinue taking Lipitor and continue taking Crestor    Follow Up  Our office will schedule a follow up appointment 2-3 weeks following discharge.  Please call us  immediately for any of the following conditions  Increased pain, redness, drainage (pus) from your incision site. Fever of 101 degrees or higher. If you should develop stroke (slurred speech, difficulty swallowing, weakness on one side of your body, loss of vision) you should call 911 and go to the nearest emergency room.  Reduce your risk of vascular disease:  Stop smoking. If you would like help call [facility] at 1-800-QUIT-NOW (581-448-9307) or [place] at [phone]. Manage your cholesterol Maintain a desired weight Control your diabetes Keep your blood pressure down  If you have any questions, please call the office at [phone].  Prescriptions given: 1.   Tramadol  #8 No Refill  Disposition: home  Patient's condition: is Good  Follow up: 1. [facility] in 2-3 weeks on Dr. Charlotte Cookey clinic  day   Maryanna Smart, PA-C [facility] [phone]   --- For [facility] use ---   Modified Rankin score at D/C (0-6): 0   IV medication needed for:  1.  Hypertension: No 2. Hypotension: No  Post-op Complications: No  1. Post-op CVA or TIA: No  If yes: Event classification (right eye, left eye, right cortical, left cortical, verterobasilar, other): n/a  If yes: Timing of event (intra-op, <6 hrs post-op, >=6 hrs post-op, unknown): n/a  2. CN injury: No  If yes: CN n/a injuried   3. Myocardial infarction: No  If yes: Dx by (EKG or clinical, Troponin): n/a  4.  CHF: No  5.  Dysrhythmia (new): No  6. Wound infection: No  7. Reperfusion symptoms: No  8. Return to OR: No  If yes: return to OR for (bleeding, neurologic, other CEA incision, other): n/a  Discharge medications: Statin use:  Yes ASA use:  Yes   Beta blocker use:  Yes ACE-Inhibitor use:  No  ARB use:  No CCB use: Yes P2Y12 Antagonist use: Yes, [ x] Plavix , [ ]  Plasugrel, [ ]  Ticlopinine, [ ]  Ticagrelor, [ ]  Other, [ ]  No for medical reason, [ ]  Non-compliant, [ ]  Not-indicated Anti-coagulant use:  No, [ ]  Warfarin, [ ]  Rivaroxaban, [ ]  Dabigatran,

## 2023-12-15 ENCOUNTER — Inpatient Hospital Stay (HOSPITAL_COMMUNITY)
Admission: EM | Admit: 2023-12-15 | Discharge: 2023-12-24 | DRG: 064 | Disposition: A | Discharge status: Skilled Nursing Facility | Attending: Family Medicine | Admitting: Family Medicine

## 2023-12-15 ENCOUNTER — Telehealth: Payer: Self-pay

## 2023-12-15 ENCOUNTER — Encounter: Admitting: Nurse Practitioner

## 2023-12-15 ENCOUNTER — Emergency Department (HOSPITAL_COMMUNITY)

## 2023-12-15 ENCOUNTER — Encounter: Payer: Self-pay | Admitting: Nurse Practitioner

## 2023-12-15 ENCOUNTER — Other Ambulatory Visit: Payer: Self-pay

## 2023-12-15 DIAGNOSIS — Z800 Family history of malignant neoplasm of digestive organs: Secondary | ICD-10-CM

## 2023-12-15 DIAGNOSIS — Z556 Problems related to health literacy: Secondary | ICD-10-CM | POA: Diagnosis not present

## 2023-12-15 DIAGNOSIS — R0902 Hypoxemia: Secondary | ICD-10-CM | POA: Diagnosis not present

## 2023-12-15 DIAGNOSIS — Z8249 Family history of ischemic heart disease and other diseases of the circulatory system: Secondary | ICD-10-CM | POA: Diagnosis not present

## 2023-12-15 DIAGNOSIS — Z955 Presence of coronary angioplasty implant and graft: Secondary | ICD-10-CM

## 2023-12-15 DIAGNOSIS — I429 Cardiomyopathy, unspecified: Secondary | ICD-10-CM | POA: Diagnosis present

## 2023-12-15 DIAGNOSIS — R4189 Other symptoms and signs involving cognitive functions and awareness: Secondary | ICD-10-CM

## 2023-12-15 DIAGNOSIS — H5462 Unqualified visual loss, left eye, normal vision right eye: Secondary | ICD-10-CM | POA: Diagnosis present

## 2023-12-15 DIAGNOSIS — G4733 Obstructive sleep apnea (adult) (pediatric): Secondary | ICD-10-CM | POA: Diagnosis present

## 2023-12-15 DIAGNOSIS — I6389 Other cerebral infarction: Secondary | ICD-10-CM | POA: Diagnosis not present

## 2023-12-15 DIAGNOSIS — Z7982 Long term (current) use of aspirin: Secondary | ICD-10-CM

## 2023-12-15 DIAGNOSIS — Z818 Family history of other mental and behavioral disorders: Secondary | ICD-10-CM

## 2023-12-15 DIAGNOSIS — Z833 Family history of diabetes mellitus: Secondary | ICD-10-CM

## 2023-12-15 DIAGNOSIS — Z79890 Hormone replacement therapy (postmenopausal): Secondary | ICD-10-CM

## 2023-12-15 DIAGNOSIS — G9341 Metabolic encephalopathy: Secondary | ICD-10-CM | POA: Diagnosis present

## 2023-12-15 DIAGNOSIS — Z951 Presence of aortocoronary bypass graft: Secondary | ICD-10-CM | POA: Diagnosis not present

## 2023-12-15 DIAGNOSIS — E782 Mixed hyperlipidemia: Secondary | ICD-10-CM | POA: Diagnosis present

## 2023-12-15 DIAGNOSIS — R29810 Facial weakness: Secondary | ICD-10-CM | POA: Diagnosis present

## 2023-12-15 DIAGNOSIS — F0394 Unspecified dementia, unspecified severity, with anxiety: Secondary | ICD-10-CM | POA: Diagnosis present

## 2023-12-15 DIAGNOSIS — I509 Heart failure, unspecified: Secondary | ICD-10-CM

## 2023-12-15 DIAGNOSIS — Z860100 Personal history of colon polyps, unspecified: Secondary | ICD-10-CM

## 2023-12-15 DIAGNOSIS — E785 Hyperlipidemia, unspecified: Secondary | ICD-10-CM | POA: Diagnosis not present

## 2023-12-15 DIAGNOSIS — G9389 Other specified disorders of brain: Secondary | ICD-10-CM | POA: Diagnosis not present

## 2023-12-15 DIAGNOSIS — Z83438 Family history of other disorder of lipoprotein metabolism and other lipidemia: Secondary | ICD-10-CM

## 2023-12-15 DIAGNOSIS — Z5941 Food insecurity: Secondary | ICD-10-CM

## 2023-12-15 DIAGNOSIS — Z79899 Other long term (current) drug therapy: Secondary | ICD-10-CM

## 2023-12-15 DIAGNOSIS — I2510 Atherosclerotic heart disease of native coronary artery without angina pectoris: Secondary | ICD-10-CM | POA: Diagnosis not present

## 2023-12-15 DIAGNOSIS — I6522 Occlusion and stenosis of left carotid artery: Secondary | ICD-10-CM | POA: Diagnosis not present

## 2023-12-15 DIAGNOSIS — J90 Pleural effusion, not elsewhere classified: Secondary | ICD-10-CM | POA: Diagnosis not present

## 2023-12-15 DIAGNOSIS — I480 Paroxysmal atrial fibrillation: Secondary | ICD-10-CM | POA: Diagnosis not present

## 2023-12-15 DIAGNOSIS — K219 Gastro-esophageal reflux disease without esophagitis: Secondary | ICD-10-CM | POA: Diagnosis present

## 2023-12-15 DIAGNOSIS — I639 Cerebral infarction, unspecified: Secondary | ICD-10-CM

## 2023-12-15 DIAGNOSIS — I5043 Acute on chronic combined systolic (congestive) and diastolic (congestive) heart failure: Secondary | ICD-10-CM | POA: Diagnosis present

## 2023-12-15 DIAGNOSIS — N184 Chronic kidney disease, stage 4 (severe): Secondary | ICD-10-CM | POA: Diagnosis present

## 2023-12-15 DIAGNOSIS — F419 Anxiety disorder, unspecified: Secondary | ICD-10-CM | POA: Diagnosis not present

## 2023-12-15 DIAGNOSIS — Z87891 Personal history of nicotine dependence: Secondary | ICD-10-CM | POA: Diagnosis not present

## 2023-12-15 DIAGNOSIS — Z885 Allergy status to narcotic agent status: Secondary | ICD-10-CM

## 2023-12-15 DIAGNOSIS — I6521 Occlusion and stenosis of right carotid artery: Secondary | ICD-10-CM | POA: Diagnosis not present

## 2023-12-15 DIAGNOSIS — F32A Depression, unspecified: Secondary | ICD-10-CM | POA: Diagnosis not present

## 2023-12-15 DIAGNOSIS — J9811 Atelectasis: Secondary | ICD-10-CM | POA: Diagnosis present

## 2023-12-15 DIAGNOSIS — Z888 Allergy status to other drugs, medicaments and biological substances status: Secondary | ICD-10-CM

## 2023-12-15 DIAGNOSIS — I130 Hypertensive heart and chronic kidney disease with heart failure and stage 1 through stage 4 chronic kidney disease, or unspecified chronic kidney disease: Secondary | ICD-10-CM | POA: Diagnosis present

## 2023-12-15 DIAGNOSIS — I6523 Occlusion and stenosis of bilateral carotid arteries: Secondary | ICD-10-CM | POA: Diagnosis not present

## 2023-12-15 DIAGNOSIS — R911 Solitary pulmonary nodule: Principal | ICD-10-CM | POA: Diagnosis present

## 2023-12-15 DIAGNOSIS — I774 Celiac artery compression syndrome: Secondary | ICD-10-CM | POA: Diagnosis not present

## 2023-12-15 DIAGNOSIS — I5032 Chronic diastolic (congestive) heart failure: Secondary | ICD-10-CM

## 2023-12-15 DIAGNOSIS — Z8042 Family history of malignant neoplasm of prostate: Secondary | ICD-10-CM

## 2023-12-15 DIAGNOSIS — Z823 Family history of stroke: Secondary | ICD-10-CM

## 2023-12-15 DIAGNOSIS — F03918 Unspecified dementia, unspecified severity, with other behavioral disturbance: Secondary | ICD-10-CM | POA: Diagnosis not present

## 2023-12-15 DIAGNOSIS — R0989 Other specified symptoms and signs involving the circulatory and respiratory systems: Secondary | ICD-10-CM | POA: Diagnosis not present

## 2023-12-15 DIAGNOSIS — R29701 NIHSS score 1: Secondary | ICD-10-CM | POA: Diagnosis present

## 2023-12-15 DIAGNOSIS — I5041 Acute combined systolic (congestive) and diastolic (congestive) heart failure: Secondary | ICD-10-CM | POA: Diagnosis not present

## 2023-12-15 DIAGNOSIS — F411 Generalized anxiety disorder: Secondary | ICD-10-CM | POA: Diagnosis present

## 2023-12-15 DIAGNOSIS — I5042 Chronic combined systolic (congestive) and diastolic (congestive) heart failure: Secondary | ICD-10-CM | POA: Diagnosis not present

## 2023-12-15 DIAGNOSIS — M7989 Other specified soft tissue disorders: Secondary | ICD-10-CM | POA: Diagnosis not present

## 2023-12-15 DIAGNOSIS — I5021 Acute systolic (congestive) heart failure: Secondary | ICD-10-CM | POA: Diagnosis not present

## 2023-12-15 DIAGNOSIS — F0393 Unspecified dementia, unspecified severity, with mood disturbance: Secondary | ICD-10-CM | POA: Diagnosis present

## 2023-12-15 DIAGNOSIS — Z87440 Personal history of urinary (tract) infections: Secondary | ICD-10-CM | POA: Diagnosis not present

## 2023-12-15 DIAGNOSIS — I739 Peripheral vascular disease, unspecified: Secondary | ICD-10-CM | POA: Diagnosis not present

## 2023-12-15 DIAGNOSIS — N1831 Chronic kidney disease, stage 3a: Secondary | ICD-10-CM | POA: Diagnosis not present

## 2023-12-15 DIAGNOSIS — Z96652 Presence of left artificial knee joint: Secondary | ICD-10-CM | POA: Diagnosis present

## 2023-12-15 DIAGNOSIS — R000 Tachycardia, unspecified: Secondary | ICD-10-CM | POA: Diagnosis not present

## 2023-12-15 DIAGNOSIS — Z9889 Other specified postprocedural states: Secondary | ICD-10-CM | POA: Diagnosis not present

## 2023-12-15 DIAGNOSIS — E039 Hypothyroidism, unspecified: Secondary | ICD-10-CM | POA: Diagnosis present

## 2023-12-15 DIAGNOSIS — Z886 Allergy status to analgesic agent status: Secondary | ICD-10-CM

## 2023-12-15 DIAGNOSIS — I25708 Atherosclerosis of coronary artery bypass graft(s), unspecified, with other forms of angina pectoris: Secondary | ICD-10-CM | POA: Diagnosis not present

## 2023-12-15 DIAGNOSIS — Z48812 Encounter for surgical aftercare following surgery on the circulatory system: Secondary | ICD-10-CM | POA: Diagnosis not present

## 2023-12-15 DIAGNOSIS — Z87820 Personal history of traumatic brain injury: Secondary | ICD-10-CM | POA: Diagnosis not present

## 2023-12-15 DIAGNOSIS — Z7902 Long term (current) use of antithrombotics/antiplatelets: Secondary | ICD-10-CM

## 2023-12-15 DIAGNOSIS — Z9862 Peripheral vascular angioplasty status: Secondary | ICD-10-CM | POA: Diagnosis not present

## 2023-12-15 DIAGNOSIS — I252 Old myocardial infarction: Secondary | ICD-10-CM

## 2023-12-15 DIAGNOSIS — I110 Hypertensive heart disease with heart failure: Secondary | ICD-10-CM | POA: Diagnosis not present

## 2023-12-15 DIAGNOSIS — R918 Other nonspecific abnormal finding of lung field: Secondary | ICD-10-CM | POA: Diagnosis not present

## 2023-12-15 DIAGNOSIS — I10 Essential (primary) hypertension: Secondary | ICD-10-CM | POA: Diagnosis not present

## 2023-12-15 DIAGNOSIS — H9190 Unspecified hearing loss, unspecified ear: Secondary | ICD-10-CM | POA: Diagnosis present

## 2023-12-15 LAB — COMPREHENSIVE METABOLIC PANEL WITH GFR
ALT: 11 U/L (ref 0–44)
AST: 48 U/L — ABNORMAL HIGH (ref 15–41)
Albumin: 3.2 g/dL — ABNORMAL LOW (ref 3.5–5.0)
Alkaline Phosphatase: 48 U/L (ref 38–126)
Anion gap: 8 (ref 5–15)
BUN: 15 mg/dL (ref 8–23)
CO2: 23 mmol/L (ref 22–32)
Calcium: 9.1 mg/dL (ref 8.9–10.3)
Chloride: 108 mmol/L (ref 98–111)
Creatinine, Ser: 1.25 mg/dL — ABNORMAL HIGH (ref 0.44–1.00)
GFR, Estimated: 43 mL/min — ABNORMAL LOW (ref >60)
Glucose, Bld: 122 mg/dL — ABNORMAL HIGH (ref 70–99)
Potassium: 4.3 mmol/L (ref 3.5–5.1)
Sodium: 139 mmol/L (ref 135–145)
Total Bilirubin: 1.2 mg/dL (ref 0.0–1.2)
Total Protein: 5.8 g/dL — ABNORMAL LOW (ref 6.5–8.1)

## 2023-12-15 LAB — CBC WITH DIFFERENTIAL/PLATELET
Abs Immature Granulocytes: 0.03 10*3/uL (ref 0.00–0.07)
Basophils Absolute: 0 10*3/uL (ref 0.0–0.1)
Basophils Relative: 1 %
Eosinophils Absolute: 0.1 10*3/uL (ref 0.0–0.5)
Eosinophils Relative: 1 %
HCT: 37.4 % (ref 36.0–46.0)
Hemoglobin: 11.5 g/dL — ABNORMAL LOW (ref 12.0–15.0)
Immature Granulocytes: 0 %
Lymphocytes Relative: 18 %
Lymphs Abs: 1.4 10*3/uL (ref 0.7–4.0)
MCH: 28.5 pg (ref 26.0–34.0)
MCHC: 30.7 g/dL (ref 30.0–36.0)
MCV: 92.6 fL (ref 80.0–100.0)
Monocytes Absolute: 0.7 10*3/uL (ref 0.1–1.0)
Monocytes Relative: 9 %
Neutro Abs: 5.5 10*3/uL (ref 1.7–7.7)
Neutrophils Relative %: 71 %
Platelets: 120 10*3/uL — ABNORMAL LOW (ref 150–400)
RBC: 4.04 MIL/uL (ref 3.87–5.11)
RDW: 14.6 % (ref 11.5–15.5)
WBC: 7.7 10*3/uL (ref 4.0–10.5)
nRBC: 0 % (ref 0.0–0.2)

## 2023-12-15 MED ORDER — LORAZEPAM 2 MG/ML IJ SOLN
1.0000 mg | Freq: Once | INTRAMUSCULAR | Status: AC | PRN
  Administered 2023-12-16: 1 mg via INTRAVENOUS
  Filled 2023-12-15: qty 1

## 2023-12-15 MED ORDER — HALOPERIDOL LACTATE 5 MG/ML IJ SOLN
5.0000 mg | Freq: Once | INTRAMUSCULAR | Status: AC
  Administered 2023-12-15: 5 mg via INTRAMUSCULAR
  Filled 2023-12-15: qty 1

## 2023-12-15 NOTE — ED Notes (Signed)
 Attempting to get blood but patient yelling, biting, and trying to punch me in the process.

## 2023-12-15 NOTE — Progress Notes (Signed)
 This encounter was created in error - please disregard.

## 2023-12-15 NOTE — ED Notes (Signed)
 Pt a little more cooperative with staff regarding getting blood.

## 2023-12-15 NOTE — Transitions of Care (Post Inpatient/ED Visit) (Signed)
   12/15/2023  Name: Deanna [name] MRN: [id number] DOB: 06-29-1941  Today's TOC FU Call Status: Today's TOC FU Call Status:: Unsuccessful Call (1st Attempt) Unsuccessful Call (1st Attempt) Date: 12/15/23  Attempted to reach the patient regarding the most recent Inpatient/ED visit.  Follow Up Plan: Additional outreach attempts will be made to reach the patient to complete the Transitions of Care (Post Inpatient/ED visit) call.   Tonia Frankel RN, CCM [place]  [facility]Population Health RN Care Manager [phone]

## 2023-12-15 NOTE — ED Triage Notes (Signed)
 Coming from home with increased confusion and some drainage to a surgical site to the neck. Afebrile.

## 2023-12-15 NOTE — ED Provider Notes (Signed)
 [facility] Provider Note   CSN: [id number] Arrival date & time: 12/15/23  2023     History {Add pertinent medical, surgical, social history, OB history to HPI:1} Chief Complaint  Patient presents with   Altered Mental Status    Deanna [name] is a 83 y.o. female.  Patient is an 83 year old female with a history of hypertension, hypothyroidism, CAD, CHF, prior bacteremia, hearing loss, cognitive impairment with recent endarterectomy with discharged from the hospital 2 days ago who was having behavioral disturbance prior to the endarterectomy and is returning today with EMS because her daughter reports that she has been unmanageable.  She has had significant decline in her mental function.  She is paranoid and thinks that her daughter is trying to hurt her but then will pull a knife on her daughter.  She states that people are trying to get her.  For me she just repeatedly says she is confused and ask where she is at.  She reports that something was wrong and we need to help her.  However she denies any localized area of pain.  She states that she thinks they changed her name and that she is here by mistake.  She is unable to give any specific information.  She does continually ask for her daughter [name].  The history is provided by the patient, the EMS personnel, medical records and a relative.  Altered Mental Status      Home Medications Prior to Admission medications   Medication Sig Start Date End Date Taking? Authorizing Provider  albuterol  (VENTOLIN  HFA) 108 (90 Base) MCG/ACT inhaler Inhale 2 puffs into the lungs every 6 (six) hours as needed for wheezing or shortness of breath. 09/12/23   Tye Gall, MD  ALPRAZolam  (XANAX ) 0.5 MG tablet Take 1 tablet (0.5 mg total) by mouth daily as needed for up to 2 doses for anxiety. 08/23/23   Lesa Rape, MD  aspirin  EC 81 MG tablet Take 81 mg by mouth at bedtime.    [provider]  busPIRone  (BUSPAR ) 15 MG tablet Take 1 tablet (15 mg total) by mouth 2 (two) times daily. 11/16/22 08/08/48  Roselyn Connor, PMHNP  Cholecalciferol  (VITAMIN D3) 1.25 MG (50000 UT) CAPS Take 1 capsule by mouth once a week. 10/26/23   [provider]  ciprofloxacin  (CIPRO ) 500 MG tablet Take 1 tablet (500 mg total) by mouth 2 (two) times daily. 12/06/23   Margherita Shell, MD  clopidogrel  (PLAVIX ) 75 MG tablet TAKE 1 TABLET(75 MG) BY MOUTH DAILY Patient taking differently: Take 75 mg by mouth at bedtime. 02/27/23   Eubanks, Jessica K, NP  Cyanocobalamin  (VITAMIN B-12 PO) Take 1 tablet by mouth in the morning.    [provider]  divalproex  (DEPAKOTE  ER) 250 MG 24 hr tablet Take 1 tablet (250 mg total) by mouth at bedtime. 11/16/22   Roselyn Connor, PMHNP  dorzolamide  (TRUSOPT ) 2 % ophthalmic solution Place 1 drop into both eyes in the morning and at bedtime.    [provider]  escitalopram  (LEXAPRO ) 20 MG tablet Take 1 tablet (20 mg total) by mouth daily. 11/16/22   Roselyn Connor, PMHNP  ezetimibe  (ZETIA ) 10 MG tablet Take 1 tablet (10 mg total) by mouth daily. 11/29/23   Verma Gobble, NP  furosemide  (LASIX ) 40 MG tablet Take 1 tablet (40 mg total) by mouth daily as needed for fluid or edema. 12/14/23   Rhyne, Samantha J, PA-C  gabapentin  (NEURONTIN ) 100  MG capsule TAKE 2 CAPSULES(200 MG) BY MOUTH DAILY Patient taking differently: Take 200 mg by mouth 2 (two) times daily. 11/09/23   Verma Gobble, NP  ketoconazole  (NIZORAL ) 2 % shampoo APPLY 10ML TOPICALLY 2 TIMES A WEEK. 11/03/22   Verma Gobble, NP  levothyroxine  (SYNTHROID ) 175 MCG tablet TAKE 1 TABLET BY MOUTH EVERY DAY AT THE SAME TIME AND SEPARATE FROM ALL OTHER MEDICATIONS 10/12/23   Verma Gobble, NP  lidocaine  (LIDODERM ) 5 % Place 1 patch onto the skin daily. Remove & Discard patch within 12 hours or as directed by MD 12/11/23   Arvilla Birmingham, MD  meclizine  (ANTIVERT ) 25 MG tablet Take 1 tablet (25  mg total) by mouth as needed for dizziness. Patient taking differently: Take 25 mg by mouth 3 (three) times daily as needed for dizziness. 11/30/18   Verma Gobble, NP  methylPREDNISolone  (MEDROL  DOSEPAK) 4 MG TBPK tablet Take by mouth as directed. Patient not taking: Reported on 11/28/2023 10/26/23   [provider]  metoprolol  succinate (TOPROL -XL) 25 MG 24 hr tablet Take 1 tablet (25 mg total) by mouth daily. 11/29/23   Eubanks, Jessica K, NP  nitrofurantoin, macrocrystal-monohydrate, (MACROBID) 100 MG capsule Take 100 mg by mouth 2 (two) times daily. Patient not taking: Reported on 11/28/2023 11/09/23   [provider]  nitroGLYCERIN  (NITROSTAT ) 0.4 MG SL tablet Place 1 tablet (0.4 mg total) under the tongue every 5 (five) minutes x 3 doses as needed for chest pain. 05/20/21   Ngetich, Dinah C, NP  pantoprazole  (PROTONIX ) 40 MG tablet Take 1 tablet (40 mg total) by mouth daily. 09/12/23   Tye Gall, MD  rosuvastatin  (CRESTOR ) 40 MG tablet TAKE 1 TABLET(40 MG) BY MOUTH DAILY Patient taking differently: Take 40 mg by mouth at bedtime. 06/26/23   Verma Gobble, NP  traMADol  (ULTRAM ) 50 MG tablet Take 1 tablet (50 mg total) by mouth every 6 (six) hours as needed for moderate pain (pain score 4-6). 12/14/23   Rhyne, Maryanna Smart, PA-C  TYLENOL  500 MG tablet Take 500-1,000 mg by mouth 2 (two) times daily.    [provider]      Allergies    Lidocaine , Cymbalta  [duloxetine  hcl], Donepezil, Mobic [meloxicam], Namenda [memantine], Nsaids, Other, Oxycontin  [oxycodone ], and Vicodin [hydrocodone-acetaminophen ]    Review of Systems   Review of Systems  Physical Exam Updated Vital Signs BP (!) 147/100   Pulse 94   Temp 98.4 F (36.9 C) (Oral)   Resp 14   Ht 5\' 1"  (1.549 m)   Wt 74 kg   SpO2 100%   BMI 30.83 kg/m  Physical Exam Vitals and nursing note reviewed.  Constitutional:      General: She is in acute distress.     Appearance: She is  well-developed.     Comments: Continually yelling for her daughter.  Attempting to hit and bite the nurses  HENT:     Head: Normocephalic and atraumatic.  Eyes:     Pupils: Pupils are equal, round, and reactive to light.  Neck:     Comments: No stridor or difficulty speaking.  Surgical scar from endarterectomy present over the right side of the neck with glue intact appears to be healing with some mild surrounding erythema.  No induration Cardiovascular:     Rate and Rhythm: Normal rate and regular rhythm.     Heart sounds: Normal heart sounds. No murmur heard.    No friction rub.  Pulmonary:  Effort: Pulmonary effort is normal.     Breath sounds: Normal breath sounds. No wheezing or rales.  Abdominal:     General: Bowel sounds are normal. There is no distension.     Palpations: Abdomen is soft.     Tenderness: There is no abdominal tenderness. There is no guarding or rebound.  Musculoskeletal:        General: No tenderness. Normal range of motion.     Right lower leg: No edema.     Left lower leg: No edema.     Comments: No edema  Skin:    General: Skin is warm and dry.     Findings: No rash.  Neurological:     Mental Status: She is alert.     Cranial Nerves: No cranial nerve deficit.     Comments: Oriented to self.  Able to move all extremities  Psychiatric:        Attention and Perception: She is inattentive.        Mood and Affect: Affect is angry.        Speech: Speech is tangential.        Behavior: Behavior is uncooperative, agitated and combative.        Thought Content: Thought content is paranoid.        Cognition and Memory: Cognition is impaired. Memory is impaired.     Comments: She denies hallucinations     ED Results / Procedures / Treatments   Labs (all labs ordered are listed, but only abnormal results are displayed) Labs Reviewed  CBC WITH DIFFERENTIAL/PLATELET - Abnormal; Notable for the following components:      Result Value   Hemoglobin 11.5  (*)    Platelets 120 (*)    All other components within normal limits  COMPREHENSIVE METABOLIC PANEL WITH GFR - Abnormal; Notable for the following components:   Glucose, Bld 122 (*)    Creatinine, Ser 1.25 (*)    Total Protein 5.8 (*)    Albumin  3.2 (*)    AST 48 (*)    GFR, Estimated 43 (*)    All other components within normal limits    EKG EKG Interpretation Date/Time:  Friday Dec 15 2023 21:04:43 EDT Ventricular Rate:  106 PR Interval:  151 QRS Duration:  80 QT Interval:  307 QTC Calculation: 408 R Axis:   2  Text Interpretation: Sinus tachycardia Probable left atrial enlargement LVH with secondary repolarization abnormality unchanged from 12/11/23 EKG Confirmed by [employer] ([id number]) on 12/15/2023 9:18:45 PM  Radiology No results found.  Procedures Procedures  {Document cardiac monitor, telemetry assessment procedure when appropriate:1}  Medications Ordered in ED Medications  haloperidol lactate (HALDOL) injection 5 mg (5 mg Intramuscular Given 12/15/23 2113)    ED Course/ Medical Decision Making/ A&P   {   Click here for ABCD2, HEART and other calculatorsREFRESH Note before signing :1}                              Medical Decision Making Amount and/or Complexity of Data Reviewed Labs: ordered. Radiology: ordered.  Risk Prescription drug management.   Pt with multiple medical problems and comorbidities and presenting today with a complaint that caries a high risk for morbidity and mortality.  Returning by EMS today because she had gotten out of the house and was running down the neighborhood shouting that someone needed to help her.  Patient did recently have a carotid endarterectomy however  the symptoms were present prior to that as she was seen in the emergency room 4 days ago for similar symptoms.  Patient is paranoid and agitated.  She is attempting to hurt the nurses.  I independently interpreted patient's EKG which shows no acute changes.  QT interval is  normal.  Given patient's escalating behavior she was given IM Haldol. Labs done yesterday showed no acute findings.  Will repeat head CT which was negative for acute findings yesterday to ensure there is been no acute bleed or other cause.  Endarterectomy site appears to be healing.  Low suspicion for acute infection.  10:54 PM I have independently visualized and interpreted pt's images today.  Chest x-ray without acute findings today.  I independently interpreted patient's labs.  CBC within normal limits.  CMP wnl and unchanged.  Blood cx and urine culture from 4 days ago was neg.  Attempted to contact patient's daughter [name] but she did not answer the phone and her mailbox is full.  Pt did have some hypoxia and most likely just due to haldol but pt also mildly tachy which also may be due to not taking her meds which 1 is metoprolol  but given recent surgery will ensure no signs of PE. If that is neg given patient's erratic behavior, pulling a knife on her daughter, paranoia and agitation even here feel that she needs TTS evaluation if she is medically clear.   {Document critical care time when appropriate:1} {Document review of labs and clinical decision tools ie heart score, Chads2Vasc2 etc:1}  {Document your independent review of radiology images, and any outside records:1} {Document your discussion with family members, caretakers, and with consultants:1} {Document social determinants of health affecting pt's care:1} {Document your decision making why or why not admission, treatments were needed:1} Final Clinical Impression(s) / ED Diagnoses Final diagnoses:  None    Rx / DC Orders ED Discharge Orders     None

## 2023-12-16 ENCOUNTER — Emergency Department (HOSPITAL_COMMUNITY)

## 2023-12-16 ENCOUNTER — Other Ambulatory Visit (HOSPITAL_COMMUNITY)

## 2023-12-16 ENCOUNTER — Inpatient Hospital Stay (HOSPITAL_COMMUNITY)

## 2023-12-16 ENCOUNTER — Encounter (HOSPITAL_COMMUNITY): Payer: Self-pay

## 2023-12-16 DIAGNOSIS — I6381 Other cerebral infarction due to occlusion or stenosis of small artery: Secondary | ICD-10-CM | POA: Diagnosis not present

## 2023-12-16 DIAGNOSIS — G9389 Other specified disorders of brain: Secondary | ICD-10-CM | POA: Diagnosis not present

## 2023-12-16 DIAGNOSIS — R918 Other nonspecific abnormal finding of lung field: Secondary | ICD-10-CM | POA: Diagnosis not present

## 2023-12-16 DIAGNOSIS — F03918 Unspecified dementia, unspecified severity, with other behavioral disturbance: Secondary | ICD-10-CM | POA: Diagnosis present

## 2023-12-16 DIAGNOSIS — F32A Depression, unspecified: Secondary | ICD-10-CM | POA: Diagnosis present

## 2023-12-16 DIAGNOSIS — R0902 Hypoxemia: Secondary | ICD-10-CM | POA: Diagnosis not present

## 2023-12-16 DIAGNOSIS — Z79890 Hormone replacement therapy (postmenopausal): Secondary | ICD-10-CM | POA: Diagnosis not present

## 2023-12-16 DIAGNOSIS — I6389 Other cerebral infarction: Secondary | ICD-10-CM | POA: Diagnosis present

## 2023-12-16 DIAGNOSIS — F0393 Unspecified dementia, unspecified severity, with mood disturbance: Secondary | ICD-10-CM | POA: Diagnosis present

## 2023-12-16 DIAGNOSIS — I429 Cardiomyopathy, unspecified: Secondary | ICD-10-CM | POA: Diagnosis present

## 2023-12-16 DIAGNOSIS — S069XAS Unspecified intracranial injury with loss of consciousness status unknown, sequela: Secondary | ICD-10-CM | POA: Diagnosis not present

## 2023-12-16 DIAGNOSIS — I5021 Acute systolic (congestive) heart failure: Secondary | ICD-10-CM | POA: Diagnosis not present

## 2023-12-16 DIAGNOSIS — M7989 Other specified soft tissue disorders: Secondary | ICD-10-CM | POA: Diagnosis not present

## 2023-12-16 DIAGNOSIS — I639 Cerebral infarction, unspecified: Secondary | ICD-10-CM | POA: Diagnosis not present

## 2023-12-16 DIAGNOSIS — R29810 Facial weakness: Secondary | ICD-10-CM | POA: Diagnosis not present

## 2023-12-16 DIAGNOSIS — R000 Tachycardia, unspecified: Secondary | ICD-10-CM

## 2023-12-16 DIAGNOSIS — Z833 Family history of diabetes mellitus: Secondary | ICD-10-CM | POA: Diagnosis not present

## 2023-12-16 DIAGNOSIS — N1832 Chronic kidney disease, stage 3b: Secondary | ICD-10-CM | POA: Diagnosis not present

## 2023-12-16 DIAGNOSIS — I739 Peripheral vascular disease, unspecified: Secondary | ICD-10-CM | POA: Diagnosis not present

## 2023-12-16 DIAGNOSIS — J439 Emphysema, unspecified: Secondary | ICD-10-CM | POA: Diagnosis not present

## 2023-12-16 DIAGNOSIS — I6523 Occlusion and stenosis of bilateral carotid arteries: Secondary | ICD-10-CM | POA: Diagnosis not present

## 2023-12-16 DIAGNOSIS — Z743 Need for continuous supervision: Secondary | ICD-10-CM | POA: Diagnosis not present

## 2023-12-16 DIAGNOSIS — N184 Chronic kidney disease, stage 4 (severe): Secondary | ICD-10-CM | POA: Diagnosis present

## 2023-12-16 DIAGNOSIS — F419 Anxiety disorder, unspecified: Secondary | ICD-10-CM

## 2023-12-16 DIAGNOSIS — Z8249 Family history of ischemic heart disease and other diseases of the circulatory system: Secondary | ICD-10-CM | POA: Diagnosis not present

## 2023-12-16 DIAGNOSIS — N1831 Chronic kidney disease, stage 3a: Secondary | ICD-10-CM | POA: Diagnosis not present

## 2023-12-16 DIAGNOSIS — I5042 Chronic combined systolic (congestive) and diastolic (congestive) heart failure: Secondary | ICD-10-CM | POA: Diagnosis not present

## 2023-12-16 DIAGNOSIS — I480 Paroxysmal atrial fibrillation: Secondary | ICD-10-CM

## 2023-12-16 DIAGNOSIS — J9811 Atelectasis: Secondary | ICD-10-CM | POA: Diagnosis not present

## 2023-12-16 DIAGNOSIS — K219 Gastro-esophageal reflux disease without esophagitis: Secondary | ICD-10-CM | POA: Diagnosis not present

## 2023-12-16 DIAGNOSIS — I5043 Acute on chronic combined systolic (congestive) and diastolic (congestive) heart failure: Secondary | ICD-10-CM | POA: Diagnosis not present

## 2023-12-16 DIAGNOSIS — H401132 Primary open-angle glaucoma, bilateral, moderate stage: Secondary | ICD-10-CM | POA: Diagnosis not present

## 2023-12-16 DIAGNOSIS — J90 Pleural effusion, not elsewhere classified: Secondary | ICD-10-CM | POA: Diagnosis not present

## 2023-12-16 DIAGNOSIS — M19012 Primary osteoarthritis, left shoulder: Secondary | ICD-10-CM | POA: Diagnosis not present

## 2023-12-16 DIAGNOSIS — I10 Essential (primary) hypertension: Secondary | ICD-10-CM | POA: Diagnosis not present

## 2023-12-16 DIAGNOSIS — I130 Hypertensive heart and chronic kidney disease with heart failure and stage 1 through stage 4 chronic kidney disease, or unspecified chronic kidney disease: Secondary | ICD-10-CM | POA: Diagnosis present

## 2023-12-16 DIAGNOSIS — R5383 Other fatigue: Secondary | ICD-10-CM | POA: Diagnosis not present

## 2023-12-16 DIAGNOSIS — R29818 Other symptoms and signs involving the nervous system: Secondary | ICD-10-CM | POA: Diagnosis not present

## 2023-12-16 DIAGNOSIS — G9341 Metabolic encephalopathy: Secondary | ICD-10-CM | POA: Diagnosis present

## 2023-12-16 DIAGNOSIS — E8809 Other disorders of plasma-protein metabolism, not elsewhere classified: Secondary | ICD-10-CM | POA: Diagnosis not present

## 2023-12-16 DIAGNOSIS — N3281 Overactive bladder: Secondary | ICD-10-CM | POA: Diagnosis not present

## 2023-12-16 DIAGNOSIS — D649 Anemia, unspecified: Secondary | ICD-10-CM | POA: Diagnosis not present

## 2023-12-16 DIAGNOSIS — E039 Hypothyroidism, unspecified: Secondary | ICD-10-CM | POA: Diagnosis not present

## 2023-12-16 DIAGNOSIS — R262 Difficulty in walking, not elsewhere classified: Secondary | ICD-10-CM | POA: Diagnosis not present

## 2023-12-16 DIAGNOSIS — I6782 Cerebral ischemia: Secondary | ICD-10-CM | POA: Diagnosis not present

## 2023-12-16 DIAGNOSIS — I509 Heart failure, unspecified: Secondary | ICD-10-CM | POA: Diagnosis not present

## 2023-12-16 DIAGNOSIS — Z87891 Personal history of nicotine dependence: Secondary | ICD-10-CM | POA: Diagnosis not present

## 2023-12-16 DIAGNOSIS — J810 Acute pulmonary edema: Secondary | ICD-10-CM | POA: Diagnosis not present

## 2023-12-16 DIAGNOSIS — E785 Hyperlipidemia, unspecified: Secondary | ICD-10-CM | POA: Diagnosis not present

## 2023-12-16 DIAGNOSIS — J9691 Respiratory failure, unspecified with hypoxia: Secondary | ICD-10-CM | POA: Diagnosis not present

## 2023-12-16 DIAGNOSIS — M6281 Muscle weakness (generalized): Secondary | ICD-10-CM | POA: Diagnosis not present

## 2023-12-16 DIAGNOSIS — I6521 Occlusion and stenosis of right carotid artery: Secondary | ICD-10-CM | POA: Diagnosis not present

## 2023-12-16 DIAGNOSIS — E782 Mixed hyperlipidemia: Secondary | ICD-10-CM | POA: Diagnosis not present

## 2023-12-16 DIAGNOSIS — Z9862 Peripheral vascular angioplasty status: Secondary | ICD-10-CM | POA: Diagnosis not present

## 2023-12-16 DIAGNOSIS — I5041 Acute combined systolic (congestive) and diastolic (congestive) heart failure: Secondary | ICD-10-CM | POA: Diagnosis not present

## 2023-12-16 DIAGNOSIS — R9089 Other abnormal findings on diagnostic imaging of central nervous system: Secondary | ICD-10-CM | POA: Diagnosis not present

## 2023-12-16 DIAGNOSIS — I2510 Atherosclerotic heart disease of native coronary artery without angina pectoris: Secondary | ICD-10-CM | POA: Diagnosis not present

## 2023-12-16 DIAGNOSIS — F0394 Unspecified dementia, unspecified severity, with anxiety: Secondary | ICD-10-CM | POA: Diagnosis present

## 2023-12-16 DIAGNOSIS — Z9889 Other specified postprocedural states: Secondary | ICD-10-CM | POA: Diagnosis not present

## 2023-12-16 DIAGNOSIS — Z7401 Bed confinement status: Secondary | ICD-10-CM | POA: Diagnosis not present

## 2023-12-16 LAB — URINALYSIS, ROUTINE W REFLEX MICROSCOPIC
Bilirubin Urine: NEGATIVE
Glucose, UA: NEGATIVE mg/dL
Hgb urine dipstick: NEGATIVE
Ketones, ur: NEGATIVE mg/dL
Leukocytes,Ua: NEGATIVE
Nitrite: NEGATIVE
Protein, ur: NEGATIVE mg/dL
Specific Gravity, Urine: 1.006 (ref 1.005–1.030)
pH: 5 (ref 5.0–8.0)

## 2023-12-16 LAB — CULTURE, BLOOD (ROUTINE X 2)
Culture: NO GROWTH
Culture: NO GROWTH
Special Requests: ADEQUATE

## 2023-12-16 LAB — GLUCOSE, CAPILLARY: Glucose-Capillary: 153 mg/dL — ABNORMAL HIGH (ref 70–99)

## 2023-12-16 LAB — TSH: TSH: 0.236 u[IU]/mL — ABNORMAL LOW (ref 0.350–4.500)

## 2023-12-16 LAB — BRAIN NATRIURETIC PEPTIDE: B Natriuretic Peptide: 1006.4 pg/mL — ABNORMAL HIGH (ref 0.0–100.0)

## 2023-12-16 MED ORDER — DIVALPROEX SODIUM ER 250 MG PO TB24
250.0000 mg | ORAL_TABLET | Freq: Every day | ORAL | Status: DC
  Administered 2023-12-16 – 2023-12-23 (×8): 250 mg via ORAL
  Filled 2023-12-16 (×9): qty 1

## 2023-12-16 MED ORDER — ENSURE ENLIVE PO LIQD
237.0000 mL | Freq: Two times a day (BID) | ORAL | Status: DC
  Administered 2023-12-16 – 2023-12-22 (×7): 237 mL via ORAL

## 2023-12-16 MED ORDER — PANTOPRAZOLE SODIUM 40 MG PO TBEC
40.0000 mg | DELAYED_RELEASE_TABLET | Freq: Every day | ORAL | Status: DC
  Administered 2023-12-16 – 2023-12-24 (×9): 40 mg via ORAL
  Filled 2023-12-16 (×9): qty 1

## 2023-12-16 MED ORDER — ALBUTEROL SULFATE (2.5 MG/3ML) 0.083% IN NEBU: 2.5000 mg | INHALATION_SOLUTION | Freq: Four times a day (QID) | RESPIRATORY_TRACT | Status: DC | PRN

## 2023-12-16 MED ORDER — FUROSEMIDE 10 MG/ML IJ SOLN
40.0000 mg | Freq: Every day | INTRAMUSCULAR | Status: DC
  Administered 2023-12-17 – 2023-12-19 (×3): 40 mg via INTRAVENOUS
  Filled 2023-12-16 (×3): qty 4

## 2023-12-16 MED ORDER — ASPIRIN 81 MG PO TBEC
81.0000 mg | DELAYED_RELEASE_TABLET | Freq: Every day | ORAL | Status: DC
  Administered 2023-12-16 – 2023-12-23 (×8): 81 mg via ORAL
  Filled 2023-12-16 (×8): qty 1

## 2023-12-16 MED ORDER — METOPROLOL SUCCINATE ER 25 MG PO TB24
25.0000 mg | ORAL_TABLET | Freq: Every day | ORAL | Status: DC
  Administered 2023-12-16 – 2023-12-24 (×9): 25 mg via ORAL
  Filled 2023-12-16 (×9): qty 1

## 2023-12-16 MED ORDER — CLOPIDOGREL BISULFATE 75 MG PO TABS
75.0000 mg | ORAL_TABLET | Freq: Every day | ORAL | Status: DC
  Administered 2023-12-16 – 2023-12-23 (×8): 75 mg via ORAL
  Filled 2023-12-16 (×8): qty 1

## 2023-12-16 MED ORDER — BUSPIRONE HCL 5 MG PO TABS
15.0000 mg | ORAL_TABLET | Freq: Two times a day (BID) | ORAL | Status: DC
  Administered 2023-12-16 – 2023-12-24 (×17): 15 mg via ORAL
  Filled 2023-12-16 (×16): qty 1
  Filled 2023-12-16: qty 2

## 2023-12-16 MED ORDER — IOHEXOL 350 MG/ML SOLN
75.0000 mL | Freq: Once | INTRAVENOUS | Status: AC | PRN
  Administered 2023-12-16: 75 mL via INTRAVENOUS

## 2023-12-16 MED ORDER — VITAMIN D 25 MCG (1000 UNIT) PO TABS
5000.0000 [IU] | ORAL_TABLET | ORAL | Status: DC
  Administered 2023-12-18: 5000 [IU] via ORAL
  Filled 2023-12-16: qty 5

## 2023-12-16 MED ORDER — ACETAMINOPHEN 650 MG RE SUPP: 650.0000 mg | Freq: Four times a day (QID) | RECTAL | Status: DC | PRN

## 2023-12-16 MED ORDER — ESCITALOPRAM OXALATE 10 MG PO TABS
20.0000 mg | ORAL_TABLET | Freq: Every day | ORAL | Status: DC
  Administered 2023-12-16 – 2023-12-24 (×9): 20 mg via ORAL
  Filled 2023-12-16 (×9): qty 2

## 2023-12-16 MED ORDER — ROSUVASTATIN CALCIUM 20 MG PO TABS
40.0000 mg | ORAL_TABLET | Freq: Every day | ORAL | Status: DC
  Administered 2023-12-16 – 2023-12-17 (×2): 40 mg via ORAL
  Filled 2023-12-16 (×2): qty 2

## 2023-12-16 MED ORDER — ENOXAPARIN SODIUM 40 MG/0.4ML IJ SOSY
40.0000 mg | PREFILLED_SYRINGE | INTRAMUSCULAR | Status: DC
  Administered 2023-12-16 – 2023-12-19 (×4): 40 mg via SUBCUTANEOUS
  Filled 2023-12-16 (×4): qty 0.4

## 2023-12-16 MED ORDER — LORAZEPAM 2 MG/ML IJ SOLN: 1.0000 mg | Freq: Once | INTRAMUSCULAR | Status: DC

## 2023-12-16 MED ORDER — ACETAMINOPHEN 325 MG PO TABS
650.0000 mg | ORAL_TABLET | Freq: Four times a day (QID) | ORAL | Status: DC | PRN
  Administered 2023-12-16 – 2023-12-21 (×8): 650 mg via ORAL
  Filled 2023-12-16 (×8): qty 2

## 2023-12-16 MED ORDER — ALPRAZOLAM 0.5 MG PO TABS
0.5000 mg | ORAL_TABLET | Freq: Every day | ORAL | Status: DC | PRN
  Administered 2023-12-17 – 2023-12-23 (×7): 0.5 mg via ORAL
  Filled 2023-12-16 (×9): qty 1

## 2023-12-16 MED ORDER — HALOPERIDOL LACTATE 5 MG/ML IJ SOLN
5.0000 mg | Freq: Once | INTRAMUSCULAR | Status: AC
  Administered 2023-12-16: 5 mg via INTRAMUSCULAR
  Filled 2023-12-16: qty 1

## 2023-12-16 MED ORDER — LORAZEPAM 2 MG/ML IJ SOLN
1.0000 mg | Freq: Once | INTRAMUSCULAR | Status: DC
Start: 2023-12-16 — End: 2023-12-16

## 2023-12-16 MED ORDER — LEVOTHYROXINE SODIUM 75 MCG PO TABS
175.0000 ug | ORAL_TABLET | Freq: Every day | ORAL | Status: DC
  Administered 2023-12-17 – 2023-12-18 (×2): 175 ug via ORAL
  Filled 2023-12-16 (×2): qty 1

## 2023-12-16 MED ORDER — LORAZEPAM 1 MG PO TABS: 1.0000 mg | ORAL_TABLET | Freq: Once | ORAL | Status: DC

## 2023-12-16 MED ORDER — LORAZEPAM 1 MG PO TABS
1.0000 mg | ORAL_TABLET | Freq: Once | ORAL | Status: AC
Start: 2023-12-16 — End: 2023-12-16

## 2023-12-16 MED ORDER — FUROSEMIDE 10 MG/ML IJ SOLN
40.0000 mg | Freq: Once | INTRAMUSCULAR | Status: AC
  Administered 2023-12-16: 40 mg via INTRAVENOUS
  Filled 2023-12-16: qty 4

## 2023-12-16 MED ORDER — EZETIMIBE 10 MG PO TABS
10.0000 mg | ORAL_TABLET | Freq: Every day | ORAL | Status: DC
  Administered 2023-12-16 – 2023-12-24 (×9): 10 mg via ORAL
  Filled 2023-12-16 (×9): qty 1

## 2023-12-16 MED ORDER — LORAZEPAM 2 MG/ML IJ SOLN
2.0000 mg | Freq: Once | INTRAMUSCULAR | Status: AC
  Administered 2023-12-16: 2 mg via INTRAMUSCULAR
  Filled 2023-12-16: qty 1

## 2023-12-16 NOTE — Progress Notes (Signed)
 Dr. Jinwala at bedside assessing L lip facial droop. VSS (see flowsheets). No arm/leg drift, eyebrow raised evenly, patient feeling everything simultaneously on both sides, no slurred speech, no vision changes.

## 2023-12-16 NOTE — ED Notes (Signed)
 Pt resting quietly, no needs voiced right now.

## 2023-12-16 NOTE — ED Notes (Signed)
 Pt's 02 decreased per MD.  Pt 02 sat to 89% on RA. 2LNC applied, 98% on 2LNC.  Pt soiled, changed and brief replaced. Fall precautions applied.

## 2023-12-16 NOTE — ED Notes (Signed)
 CT unable to perform scan. Will medicate and re attempt.

## 2023-12-16 NOTE — ED Provider Notes (Signed)
 I assumed care at signout to follow-up on imaging. Patient was brought in by family due to recent behavioral changes that have been ongoing.  In the meantime she also had a recent carotid endarterectomy but has been recovering well. Patient was noted to be tachycardic, therefore CT chest was ordered.  No signs of acute PE but the patient does appear to have mild CHF which may be a new diagnosis for her.  She also has a new oxygen  requirement Patient is still mildly tachycardic in the bed but her combativeness has improved  She appears to have a new diagnosis of CHF with oxygen  requirement, patient will be admitted, Lasix  have been ordered Discussed this with her daughter, [name], via the phone  D/w Dr Jinwala for admission once patient is medically optimized she will need to have behavioral health consultation   Eldon Greenland, MD 12/16/23 [phone]

## 2023-12-16 NOTE — Consult Note (Signed)
 NEUROLOGY CONSULT NOTE   Date of service: Dec 16, 2023 Patient Name: Deanna [name] MRN:  [id number] DOB:  06/28/1941 Chief Complaint: "concern for a L facial droop" Requesting Provider: Mandy Second, MD  History of Present Illness  Deanna [name] is a 83 y.o. female with hx of TBI, dementia paroxysmal atrial fibrillation but not on anticoagulation, peripheral chill disease, recent right carotid artery endarterectomy and angioplasty on 12/13/2023, hypothyroidism who presents to the ED with confusion and disruptive behavior.  Symptoms have been ongoing before her endarterectomy.  She was knocking on neighbors door with daughter is not home and noted be very paranoid and not trusting of the daughter.  She was admitted to the hospital today for symptom management.  There was concern raised for a slight left facial droop that was not noted earlier at the time of admission.  Some suspicion for potential stroke and neurology was consulted further evaluation workup.  It seems that despite her history of atrial fibrillation, patient is not on anticoagulation.  The best I can tell from cardiology note on 04/22/2020 is that atrial fibrillation was brief and noted following cardiac catheterization with conversion to normal sinus rhythm with amiodarone  and because the overall risk for recurrent atrial fibrillation was felt to be low, she was not initiated on anticoagulation.  Patient is currently on DAPT.  Patient does not think that her speech sounds slurred.  LKW: Unclear but noted in the afternoon today. Modified rankin score: 3-Moderate disability-requires help but walks WITHOUT assistance IV Thrombolysis: Not offered, unclear last seen normal.   EVT: Not offered, unclear last seen normal.  Symptoms not consistent with a LVO.  NIHSS components Score: Comment  1a Level of Conscious 0[x]  1[]  2[]  3[]      1b LOC Questions 0[x]  1[]  2[]       1c LOC Commands 0[x]  1[]  2[]       2 Best Gaze 0[x]  1[]  2[]        3 Visual 0[x]  1[]  2[]  3[]      4 Facial Palsy 0[]  1[x]  2[]  3[]      5a Motor Arm - left 0[x]  1[]  2[]  3[]  4[]  UN[]    5b Motor Arm - Right 0[x]  1[]  2[]  3[]  4[]  UN[]    6a Motor Leg - Left 0[x]  1[]  2[]  3[]  4[]  UN[]    6b Motor Leg - Right 0[x]  1[]  2[]  3[]  4[]  UN[]    7 Limb Ataxia 0[x]  1[]  2[]  UN[]      8 Sensory 0[x]  1[]  2[]  UN[]      9 Best Language 0[x]  1[]  2[]  3[]      10 Dysarthria 0[x]  1[]  2[]  UN[]      11 Extinct. and Inattention 0[x]  1[]  2[]       TOTAL: 1      ROS  Comprehensive ROS performed and pertinent positives documented in HPI   Past History   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   CKD (chronic kidney disease), stage IV (HCC)    Cognitive impairment    Colon polyps    Community acquired pneumonia    Depression    Diverticulosis    Dysuria    Encephalomalacia    GAD (generalized anxiety disorder)    GERD (gastroesophageal  reflux disease)    Glaucoma    Hearing loss    Hemorrhoids    Hypertension    Hypothyroidism    Incontinence    Mixed hyperlipidemia    Myocardial infarction [facility])    NSTEMI (non-ST elevated myocardial infarction) (HCC) 12/19/2011   OSA on CPAP    PAF (paroxysmal atrial fibrillation) (HCC)    Peripheral arterial disease (HCC)    Pneumonitis 04/26/2015   Pyelonephritis due to Escherichia coli 04/28/2015   Sepsis (HCC)    Spondylosis of lumbar region without myelopathy or radiculopathy    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HCC)    Urticaria    Vertigo, benign positional     Past Surgical History:  Procedure Laterality Date   ABDOMINAL AORTOGRAM W/LOWER EXTREMITY N/A 08/22/2017   Procedure: ABDOMINAL AORTOGRAM W/LOWER EXTREMITY;   Surgeon: Margherita Shell, MD;  Location: [facility] INVASIVE CV LAB;  Service: Cardiovascular;  Laterality: N/A;   BILATERAL UPPER EXTREMITY ANGIOGRAM N/A 09/11/2012   Procedure: BILATERAL UPPER EXTREMITY ANGIOGRAM;  Surgeon: Margherita Shell, MD;  Location: [facility] CATH LAB;  Service: Cardiovascular;  Laterality: N/A;   BIOPSY  03/25/2020   Procedure: BIOPSY;  Surgeon: Normie Becton., MD;  Location: [facility] ENDOSCOPY;  Service: Gastroenterology;;   carotid duplex doppler Bilateral 09/03/2012, 11/03/2011   Evidence of 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 patent left common carotid- subclavian artery bypass with turbulent flow noted at the anastomosis with velocities of 295 cm/s   CAROTID-SUBCLAVIAN BYPASS GRAFT  12/15/2011   Procedure: BYPASS GRAFT CAROTID-SUBCLAVIAN;  Surgeon: Margherita Shell, MD;  Location: [facility] OR;  Service: Vascular;  Laterality: Left;  Left Carotid subclavian bypass   CORONARY ANGIOPLASTY     CORONARY ARTERY BYPASS GRAFT     CORONARY ATHERECTOMY N/A 03/19/2020   Procedure: CORONARY ATHERECTOMY;  Surgeon: Lucendia Rusk, MD;  Location: [facility] INVASIVE CV LAB;  Service: Cardiovascular;  Laterality: N/A;   CORONARY STENT INTERVENTION N/A 03/19/2020   Procedure: CORONARY STENT INTERVENTION;  Surgeon: Lucendia Rusk, MD;  Location: [facility] INVASIVE CV LAB;  Service: Cardiovascular;  Laterality: N/A;   DOPPLER ECHOCARDIOGRAPHY  05/27/2010, 09/17/2008   Mild Proximal septal thickening is noted. Left ventricular systolic functions is normal ejection fraction =>55%. the aortic valve appears to be mildly sclerotic    ENDARTERECTOMY Right 12/13/2023   Procedure: ENDARTERECTOMY, CAROTID;  Surgeon: Margherita Shell, MD;  Location: [facility] OR;  Service: Vascular;  Laterality: Right;   ESOPHAGOGASTRODUODENOSCOPY (EGD) WITH PROPOFOL  N/A 03/25/2020   Procedure:  ESOPHAGOGASTRODUODENOSCOPY (EGD) WITH PROPOFOL ;  Surgeon: Normie Becton., MD;  Location: [facility] ENDOSCOPY;  Service: Gastroenterology;  Laterality: N/A;   fem-fem bypass graft  1999   holter monitor  01/21/2008   The predominant rhythm was normal sinus rhythm. Minimum heartrate of 63 bpm at +01:00, maximum heartrate of 105 bpm at + 10:35; and the average heartrate of 75 bpm. Ventricular ectopic activity totaled 1270: Multifocal; 866-PVC's and 404-VEs              JOINT REPLACEMENT     Left knee   LEFT HEART CATH AND CORS/GRAFTS ANGIOGRAPHY N/A 03/19/2020   Procedure: LEFT HEART CATH AND CORS/GRAFTS ANGIOGRAPHY;  Surgeon: Lucendia Rusk, MD;  Location: [facility] INVASIVE CV LAB;  Service: Cardiovascular;  Laterality: N/A;   LEFT HEART CATHETERIZATION WITH CORONARY/GRAFT ANGIOGRAM N/A 12/21/2011   Procedure: LEFT HEART CATHETERIZATION WITH Estella Helling;  Surgeon: Vashti Gentles  Addie Holstein, MD;  Location: [facility] CATH LAB;  Service: Cardiovascular;  Laterality: N/A;   NM MYOCAR PERF EJECTION FRACTION  09/22/2009, 07/03/2007   the post stress myocardial perfusion images show a normal pattern of perfusion is all regions. The post-stress ejection fraction is 68 %. no significant wall motion abnormalities noted. This is a low risk scan.   PATCH ANGIOPLASTY Right 12/13/2023   Procedure: ANGIOPLASTY, USING PATCH GRAFT;  Surgeon: Margherita Shell, MD;  Location: [facility] OR;  Service: Vascular;  Laterality: Right;   PERIPHERAL VASCULAR INTERVENTION  08/22/2017   Procedure: PERIPHERAL VASCULAR INTERVENTION;  Surgeon: Margherita Shell, MD;  Location: [facility] INVASIVE CV LAB;  Service: Cardiovascular;;  Fem/Fem Graft   REPLACEMENT TOTAL KNEE  05-2011   UNILATERAL UPPER EXTREMEITY ANGIOGRAM Left 11/15/2011   Procedure: UNILATERAL UPPER Riki Chatters;  Surgeon: Avanell Leigh, MD;  Location: [facility] CATH LAB;  Service: Cardiovascular;  Laterality: Left;    Family History: Family History  Problem Relation Age of Onset    Heart attack Mother    Heart disease Mother        before age 54   Diabetes Father    Heart disease Father    Hypertension Father    Hyperlipidemia Father    Heart attack Father 79   Heart attack Brother 39   Cerebral palsy Sister 3   Congestive Heart Failure Sister 55   Heart attack Sister 66   Hypertension Sister 25   Dementia Sister    Anxiety disorder Sister    Anxiety disorder Sister 40   Heart Problems Sister    Stroke Sister    Heart Problems Sister 40   Heart attack Sister 50   Colon cancer Brother 32   Prostate cancer Brother 57   Hypertension Daughter    Irritable bowel syndrome Daughter    Depression Daughter    Anxiety disorder Daughter     Social History  reports that she quit smoking about 42 years ago. Her smoking use included cigarettes. She started smoking about 45 years ago. She has never used smokeless tobacco. She reports that she does not currently use alcohol. She reports that she does not use drugs.  Allergies  Allergen Reactions   Lidocaine  Swelling and Other (See Comments)    Use CORRECT dose for age/weight- red welts and tongue swelling result, if not   Cymbalta  [Duloxetine  Hcl] Other (See Comments)    Increased confusion and memory concerns    Donepezil Other (See Comments)    Altered mood and Aggression     Mobic [Meloxicam] Other (See Comments)    "Heart failure"- cannot take   Namenda [Memantine] Other (See Comments)    Severe aggression   Nsaids Other (See Comments)    Cannot have due to heart issues   Other Other (See Comments)    No salt or anything that might cause fluid retention/swelling!!   Oxycontin  [Oxycodone ] Other (See Comments)    "Toxic dementia" - daughter feels like she could take this, however(??)   Vicodin [Hydrocodone-Acetaminophen ] Other (See Comments)    Delirium, Confusion, and Toxic dementia    Medications   Current Facility-Administered Medications:    acetaminophen  (TYLENOL ) tablet 650 mg, 650 mg, Oral,  Q6H PRN, 650 mg at 12/16/23 0925 **OR** acetaminophen  (TYLENOL ) suppository 650 mg, 650 mg, Rectal, Q6H PRN, Jinwala, Sagar H, MD   albuterol  (PROVENTIL ) (2.5 MG/3ML) 0.083% nebulizer solution 2.5 mg, 2.5 mg, Inhalation, Q6H PRN, Jinwala, Sagar H, MD   ALPRAZolam  (XANAX ) tablet 0.5  mg, 0.5 mg, Oral, Daily PRN, Eldon Greenland, MD   aspirin  EC tablet 81 mg, 81 mg, Oral, QHS, Jinwala, Sagar H, MD   busPIRone  (BUSPAR ) tablet 15 mg, 15 mg, Oral, BID, Jinwala, Sagar H, MD, 15 mg at 12/16/23 0925   [START ON 12/18/2023] cholecalciferol  (VITAMIN D3) 25 MCG (1000 UNIT) tablet 5,000 Units, 5,000 Units, Oral, Weekly, Eldon Greenland, MD   clopidogrel  (PLAVIX ) tablet 75 mg, 75 mg, Oral, QHS, Eldon Greenland, MD   divalproex  (DEPAKOTE  ER) 24 hr tablet 250 mg, 250 mg, Oral, QHS, Eldon Greenland, MD   enoxaparin  (LOVENOX ) injection 40 mg, 40 mg, Subcutaneous, Q24H, Jinwala, Sagar H, MD, 40 mg at 12/16/23 1610   escitalopram  (LEXAPRO ) tablet 20 mg, 20 mg, Oral, Daily, Eldon Greenland, MD, 20 mg at 12/16/23 9604   ezetimibe  (ZETIA ) tablet 10 mg, 10 mg, Oral, Daily, Eldon Greenland, MD, 10 mg at 12/16/23 5409   feeding supplement (ENSURE ENLIVE / ENSURE PLUS) liquid 237 mL, 237 mL, Oral, BID BM, Jinwala, Sagar H, MD, 237 mL at 12/16/23 1615   [START ON 12/17/2023] furosemide  (LASIX ) injection 40 mg, 40 mg, Intravenous, Daily, Jinwala, Sagar H, MD   levothyroxine  (SYNTHROID ) tablet 175 mcg, 175 mcg, Oral, Q0600, Eldon Greenland, MD   metoprolol  succinate (TOPROL -XL) 24 hr tablet 25 mg, 25 mg, Oral, Daily, Jinwala, Sagar H, MD, 25 mg at 12/16/23 8119   pantoprazole  (PROTONIX ) EC tablet 40 mg, 40 mg, Oral, Daily, Jinwala, Sagar H, MD, 40 mg at 12/16/23 1478   rosuvastatin  (CRESTOR ) tablet 40 mg, 40 mg, Oral, QHS, Jinwala, Sagar H, MD  Vitals   Vitals:   12/16/23 1317 12/16/23 1520 12/16/23 1549 12/16/23 1550  BP: (!) 160/77 134/66 134/66 (!) 141/98  Pulse: (!) 109 (!) 104  (!) 103  Resp: 18 14  16   Temp:  98.9 F (37.2 C)   98.6 F (37 C)  TempSrc: Oral   Oral  SpO2: 98% (!) 89%  98%  Weight: 76.6 kg     Height: 5\' 2"  (1.575 m)       Body mass index is 30.89 kg/m.  Physical Exam   General: Laying comfortably in bed; in no acute distress.  HENT: Normal oropharynx and mucosa. Normal external appearance of ears and nose.  Neck: Supple, no pain or tenderness  CV: No JVD. No peripheral edema.  Pulmonary: Symmetric Chest rise. Normal respiratory effort.  Abdomen: Soft to touch, non-tender.  Ext: No cyanosis, edema, or deformity  Skin: No rash. Normal palpation of skin.   Musculoskeletal: Normal digits and nails by inspection. No clubbing.   Neurologic Examination  Mental status/Cognition: Alert, oriented to self, place, initially told me that she thought it was September but then corrected herself and tells me that this is may.  Takes her some time but she is able to tell me that this is 2025.  Fair attention.  Speech/language: Fluent, comprehension intact, object naming intact, repetition intact.  Cranial nerves:   CN II Pupils equal and reactive to light, no VF deficits    CN III,IV,VI EOM intact, no gaze preference or deviation, no nystagmus    CN V normal sensation in V1, V2, and V3 segments bilaterally    CN VII Slight left facial droop versus prominent nasolabial fold.  There is somewhat asymmetric mild opening noted.   CN VIII normal hearing to speech    CN IX & X normal palatal elevation, no uvular deviation    CN XI 5/5 head turn and 5/5 shoulder  shrug bilaterally    CN XII midline tongue protrusion    Motor:  Muscle bulk: Normal, tone normal, pronator drift none tremor none Mvmt Root Nerve  Muscle Right Left Comments  SA C5/6 Ax Deltoid 5 5   EF C5/6 Mc Biceps 5 5   EE C6/7/8 Rad Triceps 5 5   WF C6/7 Med FCR     WE C7/8 PIN ECU     F Ab C8/T1 U ADM/FDI 5 5   HF L1/2/3 Fem Illopsoas 5 5   KE L2/3/4 Fem Quad 5 5   DF L4/5 D Peron Tib Ant 5 5   PF S1/2 Tibial Grc/Sol  5 5    Sensation:  Light touch Intact throughout   Pin prick    Temperature    Vibration   Proprioception    Coordination/Complex Motor:  - Finger to Nose intact bilaterally - Heel to shin intact bilaterally - Rapid alternating movement are normal - Gait: Gait deferred for patient's safety. Labs/Imaging/Neurodiagnostic studies   CBC:  Recent Labs  Lab Jan 13, 2024 0424 12/15/23 2148  WBC 9.6 7.7  NEUTROABS  --  5.5  HGB 10.6* 11.5*  HCT 34.1* 37.4  MCV 91.4 92.6  PLT 144* 120*   Basic Metabolic Panel:  Lab Results  Component Value Date   NA 139 12/15/2023   K 4.3 12/15/2023   CO2 23 12/15/2023   GLUCOSE 122 (H) 12/15/2023   BUN 15 12/15/2023   CREATININE 1.25 (H) 12/15/2023   CALCIUM  9.1 12/15/2023   GFRNONAA 43 (L) 12/15/2023   GFRAA 35 (L) 12/07/2020   Lipid Panel:  Lab Results  Component Value Date   LDLCALC 29 2024-01-13   HgbA1c:  Lab Results  Component Value Date   HGBA1C 5.8 (H) 03/18/2020   Urine Drug Screen:     Component Value Date/Time   LABOPIA NONE DETECTED 12/11/2023 1457   COCAINSCRNUR NONE DETECTED 12/11/2023 1457   LABBENZ POSITIVE (A) 12/11/2023 1457   AMPHETMU NONE DETECTED 12/11/2023 1457   THCU NONE DETECTED 12/11/2023 1457   LABBARB NONE DETECTED 12/11/2023 1457    Alcohol Level     Component Value Date/Time   ETH <15 12/11/2023 1511   INR  Lab Results  Component Value Date   INR 1.1 12/13/2023   APTT  Lab Results  Component Value Date   APTT 32 12/13/2023   AED levels: No results found for: "PHENYTOIN", "ZONISAMIDE", "LAMOTRIGINE", "LEVETIRACETA"  CT Head without contrast(Personally reviewed) 12/11/2023: CTH was negative for a large hypodensity concerning for a large territory infarct or hyperdensity concerning for an ICH  MRI Brain: Pending  ASSESSMENT   Deanna [name] is a 83 y.o. female with hx of TBI, dementia paroxysmal atrial fibrillation but not on anticoagulation, peripheral chill disease, recent right  carotid artery endarterectomy and angioplasty on 12/13/2023, hypothyroidism who presents to the ED with confusion and disruptive behavior.  Symptoms have been ongoing before her endarterectomy.  She was knocking on neighbors door with daughter is not home and noted be very paranoid and not trusting of the daughter.  She was admitted to the hospital today for symptom management.  There was concern raised for a slight left facial droop that was not noted earlier at the time of admission.  Some suspicion for potential stroke and neurology was consulted further evaluation workup.  On neuroexam, she does have slight left facial asymmetry.  It is hard to tell if this is a facial droop versus a facial fold.  When  she opens her mouth, the left does open less than the right side.  She did have a recent right carotid endarterectomy and a potential embolus could explain a left facial droop.  RECOMMENDATIONS  -I would recommend starting with an MRI of the brain without contrast.  If the MRI of the brain shows a stroke, I would recommend getting full stroke workup. ______________________________________________________________________    Signed, Kiyona Mcnall, MD [facility]

## 2023-12-16 NOTE — Progress Notes (Addendum)
 Notified by RN that patient developed a mild left facial droop which is new as of 1500 today. Confirmed by daughter at bedside.  During my assessment, vital signs stable and patient not in any acute distress.  Neurological exam: AAO to person and place, but not to time (chronic). PERRL, EOMI. 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 Facial sensation symmetric. Mild L facial droop is present. Shoulder shrug symmetric. Tongue protrusion midline. Uvula midline. Strength is 5/5 bilaterally in upper and lower extremities. Sensation intact and symmetric bilaterally throughout. Difficult to perform FNF and [facility] given cognition but no overt abnormalities noted. Gait testing deferred.  Discussed with neurology, who recommended MRI brain for further evaluation. Given very mild deficit, likely not a candidate for intervention. She is already on ASA and plavix  therapy. Despite history of Afib, she is not on anticoagulation given very brief episode in the past. EKG this admission showed sinus rhythm. Neurology will come evaluate patient.  Plan: -f/u MRI brain wo contrast -continue ASA and plavix  -she has eaten since symptom onset and thus passed swallow eval, will continue diet -other workup as recommended by neurology after formal evaluation  Dan Dun, MD [facility] 12/16/2023, 4:03 PM

## 2023-12-16 NOTE — Plan of Care (Signed)
 NO BP/STICKS TO L ARM- pt has subclavian stenosis. Will not let IV team place a new IV.   5:18pm: patient is very agitated and paranoid. Patient claims RN and everyone is trying to kill her. Refusing PO meds    Problem: Education: Goal: Knowledge of General Education information will improve Description: Including pain rating scale, medication(s)/side effects and non-pharmacologic comfort measures Outcome: Progressing   Problem: Health Behavior/Discharge Planning: Goal: Ability to manage health-related needs will improve Outcome: Progressing   Problem: Clinical Measurements: Goal: Ability to maintain clinical measurements within normal limits will improve Outcome: Progressing Goal: Will remain free from infection Outcome: Progressing Goal: Diagnostic test results will improve Outcome: Progressing Goal: Respiratory complications will improve Outcome: Progressing Goal: Cardiovascular complication will be avoided Outcome: Progressing   Problem: Activity: Goal: Risk for activity intolerance will decrease Outcome: Progressing

## 2023-12-16 NOTE — ED Notes (Signed)
 Provider at bedside chatting with patient and family member.

## 2023-12-16 NOTE — Progress Notes (Signed)
 IV team to bedside to initiate new IV access. Pt agitated, confused, paranoid about staff intentions (thinks we are trying to harm her). Primary RN contacts daughter and we attempt discussion to comfort pt without success.To attempt when patient is more agreeable to IV.

## 2023-12-16 NOTE — ED Notes (Signed)
 Pt taken to CT.

## 2023-12-16 NOTE — H&P (Addendum)
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 their medical condition is felt to place them at decreased risk for further clinical deterioration. Furthermore, it is anticipated that the patient will be medically stable for discharge from the hospital within 2 midnights of admission.   Portions of this note were generated with Scientist, clinical (histocompatibility and immunogenetics). Dictation errors may occur despite best attempts at proofreading.  Author: Mandy Second, MD 12/16/2023 8:09 AM  For on call review [email or web address].

## 2023-12-16 NOTE — ED Notes (Signed)
TTS conversing with pt.

## 2023-12-16 NOTE — Progress Notes (Signed)
 Patient is very confused and not allowing team to start IV because she is convinced we are trying to kill her. Attending has been notified.

## 2023-12-17 ENCOUNTER — Inpatient Hospital Stay (HOSPITAL_COMMUNITY)

## 2023-12-17 DIAGNOSIS — R0902 Hypoxemia: Secondary | ICD-10-CM | POA: Diagnosis not present

## 2023-12-17 DIAGNOSIS — I5021 Acute systolic (congestive) heart failure: Secondary | ICD-10-CM

## 2023-12-17 LAB — ECHOCARDIOGRAM COMPLETE
Area-P 1/2: 5.66 cm2
Height: 62 in
S' Lateral: 3.1 cm
Weight: 2641.99 [oz_av]

## 2023-12-17 LAB — CBC
HCT: 34 % — ABNORMAL LOW (ref 36.0–46.0)
Hemoglobin: 11.2 g/dL — ABNORMAL LOW (ref 12.0–15.0)
MCH: 28.9 pg (ref 26.0–34.0)
MCHC: 32.9 g/dL (ref 30.0–36.0)
MCV: 87.9 fL (ref 80.0–100.0)
Platelets: 182 10*3/uL (ref 150–400)
RBC: 3.87 MIL/uL (ref 3.87–5.11)
RDW: 13.9 % (ref 11.5–15.5)
WBC: 5.8 10*3/uL (ref 4.0–10.5)
nRBC: 0 % (ref 0.0–0.2)

## 2023-12-17 LAB — T4, FREE: Free T4: 1.53 ng/dL — ABNORMAL HIGH (ref 0.61–1.12)

## 2023-12-17 MED ORDER — PERFLUTREN LIPID MICROSPHERE
1.0000 mL | INTRAVENOUS | Status: AC | PRN
  Administered 2023-12-17: 3 mL via INTRAVENOUS

## 2023-12-17 MED ORDER — QUETIAPINE FUMARATE 25 MG PO TABS
25.0000 mg | ORAL_TABLET | Freq: Two times a day (BID) | ORAL | Status: DC | PRN
  Administered 2023-12-17 – 2023-12-24 (×6): 25 mg via ORAL
  Filled 2023-12-17 (×6): qty 1

## 2023-12-17 MED ORDER — QUETIAPINE FUMARATE 25 MG PO TABS
25.0000 mg | ORAL_TABLET | Freq: Every day | ORAL | Status: DC
Start: 2023-12-17 — End: 2023-12-20
  Administered 2023-12-17 – 2023-12-19 (×3): 25 mg via ORAL
  Filled 2023-12-17 (×3): qty 1

## 2023-12-17 NOTE — Evaluation (Signed)
 Physical Therapy Evaluation Patient Details Name: Deanna [name] MRN: [id number] DOB: Feb 26, 1941 Today's Date: 12/17/2023  History of Present Illness  Deanna [name] is a 83 y.o. female admitted 12/15/23 for AMS. Pt presented with behavioral changes including worsening confusion and paranoia. There was concern raised for a slight left facial droop, neurology consulted, MRI pending. PMH of TBI, dementia, paroxysmal atrial fibrillation, PAD, recent right carotid artery endarterectomy and angioplasty on 12/13/2023, hypothyroidism, CKD, CAD s/p stent, anxiety and depression.   Clinical Impression  Pt admitted with above diagnosis. Examination limited by impaired cognition, lack of family member present, and fatigue. Will need to verify home set-up and PLOF as mentation improves and/or when family is available. Pt currently with functional limitations due to the deficits listed below (see PT Problem List). She required minA x2 for bed mobility and transfers. Pt maintained static stance for ~63mins before requiring a seated rest break. Pt will benefit from acute skilled PT to increase her independence and safety with mobility to allow discharge. Recommend post-acute rehab <3 hours/day of therapy to decrease fall risk, reduce risk of rehospitalization, and maximize rehab potential prior to return home.    If plan is discharge home, recommend the following: A lot of help with walking and/or transfers;A lot of help with bathing/dressing/bathroom;Assistance with cooking/housework;Supervision due to cognitive status;Direct supervision/assist for medications management;Direct supervision/assist for financial management;Assist for transportation;Help with stairs or ramp for entrance   Can travel by private vehicle   No    Equipment Recommendations BSC/3in1;Wheelchair (measurements PT)  Recommendations for Other Services       Functional Status Assessment Patient has had a recent decline in their functional  status and demonstrates the ability to make significant improvements in function in a reasonable and predictable amount of time.     Precautions / Restrictions Precautions Precautions: Fall Recall of Precautions/Restrictions: Intact Restrictions Weight Bearing Restrictions Per Provider Order: No      Mobility  Bed Mobility Overal bed mobility: Needs Assistance Bed Mobility: Supine to Sit, Sit to Supine     Supine to sit: Min assist, +2 for physical assistance Sit to supine: Min assist, +2 for physical assistance   General bed mobility comments: Pt sat up on R side of bed. She went into long sitting with trunk support. Cued pt to walk BLE off EOB. Pivoted her and scooted her to feet flat on floor with use of bed pad. Returned to bed with OT managing trunk and PT managing BLE. Repositioned using bed features and bed pad.    Transfers Overall transfer level: Needs assistance Equipment used: 2 person hand held assist Transfers: Sit to/from Stand Sit to Stand: Min assist, +2 physical assistance           General transfer comment: Pt stood from lowest bed height with minA to power up. She maintained static stance for ~64mins. Pt initially maintained BLE in slight knee flex, VC to extend LE and increase upright posture. Good eccentric control with sitting. As pt fatigued, she required increased assistance.    Ambulation/Gait               General Gait Details: Deferred d/t fatigue and lethargy.  Stairs            Wheelchair Mobility     Tilt Bed    Modified Rankin (Stroke Patients Only)       Balance Overall balance assessment: Needs assistance Sitting-balance support: Feet supported, Single extremity supported Sitting balance-Leahy Scale: Fair Sitting balance -  Comments: Pt sat EOB initially with minA able to less to CGA.   Standing balance support: Bilateral upper extremity supported, During functional activity Standing balance-Leahy Scale:  Fair Standing balance comment: Pt dependent on 2 HHA and minA x2.                             Pertinent Vitals/Pain Pain Assessment Pain Assessment: PAINAD Breathing: normal Negative Vocalization: occasional moan/groan, low speech, negative/disapproving quality Facial Expression: smiling or inexpressive Body Language: relaxed Consolability: no need to console PAINAD Score: 1 Pain Intervention(s): Monitored during session, Repositioned    Home Living Family/patient expects to be discharged to:: Unsure Living Arrangements: Children Available Help at Discharge: Family;Available PRN/intermittently Type of Home: House Home Access: Level entry       Home Layout: One level Home Equipment: Grab bars - tub/shower;Grab bars - toilet;Shower seat;Rolling Environmental consultant (2 wheels) Additional Comments: Above home set up information obtained from chart review 08/17/2023.    Prior Function Prior Level of Function : Patient poor historian/Family not available;Independent/Modified Independent                     Extremity/Trunk Assessment   Upper Extremity Assessment Upper Extremity Assessment: Defer to OT evaluation    Lower Extremity Assessment Lower Extremity Assessment: Overall WFL for tasks assessed    Cervical / Trunk Assessment Cervical / Trunk Assessment: Normal  Communication   Communication Communication: No apparent difficulties    Cognition Arousal: Lethargic Behavior During Therapy: WFL for tasks assessed/performed   PT - Cognitive impairments: History of cognitive impairments, Orientation (Dementia)   Orientation impairments: Place, Time, Situation (Pt able to state name and DOB. Pt believed she was at her home. She was unable to state a month but thought the year was 2021. Reoriented pt.)                   PT - Cognition Comments: Pt reporting "I don't know what is going on. I am not sure." Following commands: Impaired Following commands  impaired: Only follows one step commands consistently, Follows one step commands with increased time     Cueing Cueing Techniques: Verbal cues, Gestural cues, Tactile cues     General Comments General comments (skin integrity, edema, etc.): VSS on RA.    Exercises     Assessment/Plan    PT Assessment Patient needs continued PT services  PT Problem List Decreased activity tolerance;Decreased balance;Decreased mobility;Decreased cognition;Decreased knowledge of use of DME;Decreased safety awareness       PT Treatment Interventions DME instruction;Gait training;Stair training;Functional mobility training;Therapeutic activities;Therapeutic exercise;Balance training;Cognitive remediation;Patient/family education    PT Goals (Current goals can be found in the Care Plan section)  Acute Rehab PT Goals Patient Stated Goal: Unable to state PT Goal Formulation: Patient unable to participate in goal setting Time For Goal Achievement: 12/31/23 Potential to Achieve Goals: Good    Frequency Min 2X/week     Co-evaluation PT/OT/SLP Co-Evaluation/Treatment: Yes Reason for Co-Treatment: Necessary to address cognition/behavior during functional activity;To address functional/ADL transfers PT goals addressed during session: Mobility/safety with mobility;Balance         AM-PAC PT "6 Clicks" Mobility  Outcome Measure Help needed turning from your back to your side while in a flat bed without using bedrails?: A Lot Help needed moving from lying on your back to sitting on the side of a flat bed without using bedrails?: A Lot Help needed moving to and from  a bed to a chair (including a wheelchair)?: Total Help needed standing up from a chair using your arms (e.g., wheelchair or bedside chair)?: A Lot Help needed to walk in hospital room?: Total Help needed climbing 3-5 steps with a railing? : Total 6 Click Score: 9    End of Session Equipment Utilized During Treatment: Gait belt Activity  Tolerance: Patient tolerated treatment well Patient left: in bed;with call bell/phone within reach;with bed alarm set;with restraints reapplied Nurse Communication: Mobility status PT Visit Diagnosis: Difficulty in walking, not elsewhere classified (R26.2);Unsteadiness on feet (R26.81);Other abnormalities of gait and mobility (R26.89)    Time: 1610-9604 PT Time Calculation (min) (ACUTE ONLY): 29 min   Charges:   PT Evaluation $PT Eval Moderate Complexity: 1 Mod   PT General Charges $$ ACUTE PT VISIT: 1 Visit         Glenford Lanes, PT, DPT Acute Rehabilitation Services Office: [phone] Secure Chat Preferred  Riva Chester 12/17/2023, 1:30 PM

## 2023-12-17 NOTE — Progress Notes (Signed)
*  PRELIMINARY RESULTS* Echocardiogram 2D Echocardiogram has been performed with Definity .  Deanna [name] 12/17/2023, 12:51 PM

## 2023-12-17 NOTE — Progress Notes (Signed)
 Progress Note   Patient: Deanna [name]:[id number] DOB: [birth year] DOA: 12/15/2023     1 DOS: the patient was seen and examined on 12/17/2023   Brief hospital course: 83 y.o. 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 Charlotte Cookey), stage 3A CKD, CAD s/p stent, hypothyroidism, anxiety and depression who presented to the ED with behavioral changes.   Is being treated for CHF exacerbation, and is being evaluated for stroke due to a new left facial droop.  Assessment and Plan:  Concern for CVA Staff and family noticed new mild left facial droop on 5/10. Neurology consulted.  Recommend MRI. - Follow-up MRI brain. - Continue home aspirin , Plavix .  Hypoxia Acute on chronic combined systolic and diastolic HF Small right pleural effusion with adjacent atelectasis Patient primarily presenting with behavioral changes but noted to be hypoxic on arrival, requiring 2L [place].  Not significantly volume overloaded on exam though does have a small right pleural effusion with adjacent atelectasis on CT. A lso with mild bilateral interlobular septal thickening with lower lobe ground glass attenuation, favoring mild pulmonary edema.  Her BNP is elevated to 1000.  Hypoxia likely due to combination of CHF exacerbation and atelectasis.  She is on lasix  40mg  daily at home along with toprol -xl 25mg  daily. Last ECHO in 2022 with LVEF 50-55%, low normal LV function, RWMA, indeterminate diastolic parameters (though was noted to have systolic and diastolic dysfunction on ECHO in 2021).  - Received dose of IV lasix  40mg  in ED, will continue daily for now.  -  Will also provide ICS and attempt with mobilize OOB with PT/OT. -resume home toprol -xl 25mg  daily -TSH suppressed at 0.236.  Will check free T4. -f/u ECHO -telemetry -daily weights, strict  I/O's -incentive spirometry -PT/OT eval -supplemental O2 prn to maintain O2 sats >92% -continuous pulse ox -lovenox  for dvt ppx   Dementia with behavioral changes Hx of TBI Patient with worsening cognitive decline per reports from daughter and chart review. This does not appear to be an acute issue.  She has dementia and has been tried on memantine and donepezil (discontinued due to allergic reaction/intolerance).  She will need behavioral assessment once medically cleared.  Per chart review, family unable to afford memory care facility.  May need to consider SNF at discharge, pending PT/OT eval. UA unremarkable. Negative recent CT head on 5/5. -delirium precautions -Follow-up free T4 -psych consult ordered by ED provider.  Will follow-up on consult. -PT/OT eval   Sinus tachycardia Paroxysmal A fib Patient with pAF noted on chart review, not on anticoagulation at home. EKG does show mild sinus tachycardia with telemetry showing rates in the low 100s. -resumed home toprol -xl 25mg  daily -continue home aspirin , plavix  -telemetry   HTN BP ranging in the 120s-160s/80s-90s. She is on toprol -xl 25mg  daily at home. -resumed home toprol -xl -trend BP curve   PAD s/p fem-fem bypass CAD s/p stent Carotid artery occlusion s/p R endarterectomy with bovine patch (5/7, Dr. Charlotte Cookey) -continue home aspirin  and plavix  -continue  home crestor  and zetia    CKD 3A -baseline Cr 1.2-1.4, on admission Cr 1.25 -chronic and stable -avoid nephrotoxic medications as able -monitor UOP   Anxiety and depression -PDMP reviewed and appropriate -resume home lexapro  20mg  daily -resume home xanax  0.5mg  daily prn -resume home depakote  250mg  at bedtime -resume home buspar  15mg  BID   Hypothyroidism TSH 0.236. - continue home synthroid  175mcg  daily for now.  -f/u free T4   GERD -resume home PPI   5mm LUL lung nodule -repeat CT in 6-12 months as outpatient      Subjective: Patient has been  resting this morning. MRI is pending.   Physical Exam: Vitals:   12/17/23 0145 12/17/23 0522 12/17/23 0704 12/17/23 0823  BP: (!) 162/88   (!) 121/58  Pulse: 97 88  71  Resp: 12   18  Temp: 98.1 F (36.7 C)   98.6 F (37 C)  TempSrc: Oral   Axillary  SpO2:  100%  96%  Weight:   74.9 kg   Height:       Physical Exam   General: Lethargic Eyes: Pupils equal, reactive  Oral cavity: moist mucous membranes  Head: Atraumatic, normocephalic  Neck: supple  Chest: clear to auscultation. No crackles, no wheezes  CVS: S1,S2 RRR. No murmurs  Abd: No distention, soft, non-tender. No masses palpable  Extr: No edema   MSK: No joint deformities or swelling  Neurological: Grossly intact.    Data Reviewed:     Latest Ref Rng & Units 12/17/2023    2:35 AM 12/15/2023    9:48 PM 12/14/2023    4:24 AM  CBC  WBC 4.0 - 10.5 K/uL 5.8  7.7  9.6   Hemoglobin 12.0 - 15.0 g/dL 13.0  86.5  78.4   Hematocrit 36.0 - 46.0 % 34.0  37.4  34.1   Platelets 150 - 400 K/uL 182  120  144       Latest Ref Rng & Units 12/15/2023    9:48 PM 12/14/2023    4:24 AM 12/13/2023    3:18 PM  BMP  Glucose 70 - 99 mg/dL 696  295    BUN 8 - 23 mg/dL 15  12    Creatinine 2.84 - 1.00 mg/dL 1.32  4.40  1.02   Sodium 135 - 145 mmol/L 139  139    Potassium 3.5 - 5.1 mmol/L 4.3  4.5    Chloride 98 - 111 mmol/L 108  108    CO2 22 - 32 mmol/L 23  24    Calcium  8.9 - 10.3 mg/dL 9.1  8.4       Family Communication: n/a  Disposition: Status is: Inpatient Remains inpatient appropriate because: Being treated fo CHF exacerbation, hypoxia.   Planned Discharge Destination: Skilled nursing facility    Time spent: 35 minutes  Author: MDALA-GAUSI, Braxxton Stoudt AGATHA, MD 12/17/2023 12:41 PM  For on call review [email or web address].

## 2023-12-17 NOTE — Evaluation (Signed)
 Occupational Therapy Evaluation Patient Details Name: Deanna [name] MRN: [id number] DOB: 1941/01/24 Today's Date: 12/17/2023   History of Present Illness   Deanna [name] is a 83 y.o. female admitted 12/15/23 for AMS. Pt presented with behavioral changes including worsening confusion and paranoia. There was concern raised for a slight left facial droop, neurology consulted, MRI pending. PMH of TBI, dementia, paroxysmal atrial fibrillation, PAD, recent right carotid artery endarterectomy and angioplasty on 12/13/2023, hypothyroidism, CKD, CAD s/p stent, anxiety and depression.     Clinical Impressions Pt unable to provide PLOF or information regarding home set-up and information below is per chart review. Will need to verify home set-up and PLOF as mentation improves and/or when family is available. Pt currently presents with decreased activity tolerance, increased lethargy, increased fatigue, decreased balance, impaired cognition, generalized B UE weakness, decreased B UE fine motor coordination; decreased B shoulder ROM, pain in Left shoulder radiating to bicep that is worse with movement, and decreased safety and independence with functional tasks. Pt currently demonstrates ability to complete UB ADLs with Min to Max assist, LB ADLs with Max to Total assist +2, and bed mobility and functional STS transfers with HHA with Min assist +2. Pt VSS on RA throughout session. Pt participated well in session and will benefit from continued acute skilled OT services to address deficits outlined below and increase safety and independence with functional tasks. Post acute discharge, pt will benefit from intensive inpatient skilled rehab services to< 3 hours per day to maximize rehab potential. Pt may be able to progress to [facility] post acute discharge if family is able to provide 24/7 assistance/supervision and are able/comfortable providing level of assist pt needs at time of discharge.     If plan is discharge  home, recommend the following:   Two people to help with walking and/or transfers;A lot of help with bathing/dressing/bathroom;Assistance with cooking/housework;Assistance with feeding;Direct supervision/assist for medications management;Direct supervision/assist for financial management;Assist for transportation;Help with stairs or ramp for entrance;Supervision due to cognitive status     Functional Status Assessment   Patient has had a recent decline in their functional status and demonstrates the ability to make significant improvements in function in a reasonable and predictable amount of time.     Equipment Recommendations   Other (comment) (TBD - uncertain of what equipment pt already has)     Recommendations for Other Services         Precautions/Restrictions   Precautions Precautions: Fall Restrictions Weight Bearing Restrictions Per Provider Order: No     Mobility Bed Mobility Overal bed mobility: Needs Assistance Bed Mobility: Supine to Sit, Sit to Supine     Supine to sit: Min assist, +2 for physical assistance Sit to supine: Min assist, +2 for physical assistance   General bed mobility comments: with cues for initiation, sequencing, and hand placement/technique    Transfers Overall transfer level: Needs assistance Equipment used: 2 person hand held assist Transfers: Sit to/from Stand Sit to Stand: Min assist, +2 physical assistance                  Balance Overall balance assessment: Needs assistance Sitting-balance support: Single extremity supported, Feet supported Sitting balance-Leahy Scale: Fair Sitting balance - Comments: Pt sat EOB initially with minA able to less to CGA.   Standing balance support: Bilateral upper extremity supported, During functional activity Standing balance-Leahy Scale: Fair Standing balance comment: Pt dependent on 2 HHA and minA x2.  ADL either performed or assessed with  clinical judgement   ADL Overall ADL's : Needs assistance/impaired   Eating/Feeding Details (indicate cue type and reason): deferred this session for pt safety secondary to lethargy and fatigue Grooming: Minimal assistance;Moderate assistance;Sitting;Cueing for sequencing;Cueing for compensatory techniques (cues for initiation)   Upper Body Bathing: Moderate assistance;Maximal assistance;Sitting;Cueing for sequencing;Cueing for compensatory techniques (cues for initiation)   Lower Body Bathing: Maximal assistance;Cueing for safety;Cueing for sequencing;Cueing for compensatory techniques;Sit to/from stand;+2 for physical assistance;+2 for safety/equipment (cues for initiation)   Upper Body Dressing : Moderate assistance;Sitting;Cueing for sequencing;Cueing for compensatory techniques (cues for initiation)   Lower Body Dressing: Maximal assistance;Total assistance;+2 for physical assistance;+2 for safety/equipment;Sit to/from stand;Cueing for sequencing;Cueing for compensatory techniques (cues for initiation)     Toilet Transfer Details (indicate cue type and reason): deferred this session for pt/therapist safety secondary to lethargy and fatigue Toileting- Clothing Manipulation and Hygiene: Total assistance;+2 for physical assistance;+2 for safety/equipment;Sit to/from stand         General ADL Comments: Pt with decreased activity tolerance with pt lethargic throughout session and fatiguing quickly during tasks.     Vision Baseline Vision/History: 1 Wears glasses Patient Visual Report: No change from baseline Additional Comments: Pt largely kept eyes closed during session with pt opening eyes when cued to. Pt unable to follow instructions for vision screen this day. OT plans to further assess vision during funcitonal tasks in future sessions.     Perception         Praxis         Pertinent Vitals/Pain Pain Assessment Pain Assessment: Faces Faces Pain Scale: Hurts little  more Pain Location: Left shoulder radiating into Left bicep worse with PROM/AAROM shoulder flexion and abduction Pain Descriptors / Indicators: Discomfort, Grimacing, Other (Comment) (Pt stating, "It hurts," but unable to describe further.) Pain Intervention(s): Monitored during session, Repositioned     Extremity/Trunk Assessment Upper Extremity Assessment Upper Extremity Assessment: Right hand dominant;Generalized weakness;RUE deficits/detail;LUE deficits/detail RUE Deficits / Details: generalized weakness; mildly decreased fine motor coordination; shoulder AROM to approx 75 degrees and PROM/AAROM to approx 100 degrees RUE Coordination: decreased fine motor (mild) LUE Deficits / Details: generalized weakness; decreased fine motor coordination; shoulder AROM to approx 45 degrees and PROM/AAROM to approx 90 degrees; pt reporting her L UE "hurts" and indicating pain from should down into bicep with pain worse with PROM/AAROM shoulder flexion and abduction LUE: Shoulder pain with ROM;Shoulder pain at rest (worse with ROM) LUE Coordination: decreased fine motor   Lower Extremity Assessment Lower Extremity Assessment: Defer to PT evaluation   Cervical / Trunk Assessment Cervical / Trunk Assessment: Normal   Communication Communication Communication: No apparent difficulties   Cognition Arousal: Lethargic Behavior During Therapy: WFL for tasks assessed/performed Cognition: Cognition impaired, History of cognitive impairments, No family/caregiver present to determine baseline   Orientation impairments: Place, Time, Situation (Oriented to self and asking where her daughter is) Awareness: Intellectual awareness impaired, Online awareness impaired Memory impairment (select all impairments): Short-term memory, Working [employer] fast streamer, Conservation officer, historic buildings, Non-declarative long-term memory Attention impairment (select first level of impairment): Selective attention Executive functioning  impairment (select all impairments): Initiation, Organization, Sequencing, Reasoning, Problem solving OT - Cognition Comments: Pt with diagnosis of dementia and with baseline cognitive deficits per chart review. However, OT is uncertain of level of baseline cognitive impairment and confusion.                 Following commands: Impaired Following commands impaired: Only follows one step commands  consistently, Follows one step commands with increased time     Cueing  General Comments   Cueing Techniques: Verbal cues;Gestural cues;Tactile cues  VSS on RA throughout session.   Exercises     Shoulder Instructions      Home Living Family/patient expects to be discharged to:: Unsure Living Arrangements: Children (daughter) Available Help at Discharge: Family;Available PRN/intermittently (per chart review, daughter works outside the home) Type of Home: House Home Access: Level entry     Home Layout: One level     Bathroom Shower/Tub: Producer, television/film/video: Standard Bathroom Accessibility: Yes   Home Equipment: Grab bars - tub/shower;Grab bars - toilet;Shower seat;Rolling Environmental consultant (2 wheels)   Additional Comments: Above home set up information obtained from chart review 08/17/2023.      Prior Functioning/Environment Prior Level of Function : Patient poor historian/Family not available;Independent/Modified Independent             Mobility Comments: Pt unable to report PLOF. ADLs Comments: Pt unable to state PLOF. Pt reports she loves music.    OT Problem List: Decreased strength;Decreased activity tolerance;Impaired balance (sitting and/or standing);Decreased coordination;Decreased cognition;Decreased safety awareness;Decreased knowledge of use of DME or AE;Pain;Decreased range of motion   OT Treatment/Interventions: Self-care/ADL training;Therapeutic exercise;DME and/or AE instruction;Therapeutic activities;Cognitive remediation/compensation;Patient/family  education;Balance training      OT Goals(Current goals can be found in the care plan section)   Acute Rehab OT Goals Patient Stated Goal: to not have pain in her L arm, to take a nap, and to see her daughter OT Goal Formulation: Patient unable to participate in goal setting Time For Goal Achievement: 12/31/23 Potential to Achieve Goals: Fair ADL Goals Pt Will Perform Eating: with set-up;sitting Pt Will Perform Grooming: with set-up;with supervision;sitting Pt Will Perform Lower Body Bathing: with min assist;sit to/from stand Pt Will Perform Upper Body Dressing: with contact guard assist;sitting Pt Will Perform Lower Body Dressing: with min assist;sit to/from stand Pt Will Transfer to Toilet: with contact guard assist;ambulating;bedside commode (with least restrictive AD) Pt Will Perform Toileting - Clothing Manipulation and hygiene: with min assist;sit to/from stand   OT Frequency:  Min 2X/week    Co-evaluation PT/OT/SLP Co-Evaluation/Treatment: Yes Reason for Co-Treatment: Necessary to address cognition/behavior during functional activity;To address functional/ADL transfers PT goals addressed during session: Mobility/safety with mobility;Balance OT goals addressed during session: ADL's and self-care;Strengthening/ROM      AM-PAC OT "6 Clicks" Daily Activity     Outcome Measure Help from another person eating meals?: A Little Help from another person taking care of personal grooming?: A Lot Help from another person toileting, which includes using toliet, bedpan, or urinal?: Total Help from another person bathing (including washing, rinsing, drying)?: A Lot Help from another person to put on and taking off regular upper body clothing?: A Lot Help from another person to put on and taking off regular lower body clothing?: A Lot 6 Click Score: 12   End of Session Nurse Communication: Mobility status;Other (comment) (Pt confused but participated well. Pt reports she loves music. OT  left classical music playing on computer in room with pt stating the music, "is wonderful.")  Activity Tolerance: Patient tolerated treatment well;Patient limited by lethargy;Other (comment);Patient limited by fatigue (Pt limited by current cognitive level.) Patient left: in bed;with call bell/phone within reach;with bed alarm set;with restraints reapplied (with classical music playing)  OT Visit Diagnosis: Unsteadiness on feet (R26.81);Other abnormalities of gait and mobility (R26.89);Muscle weakness (generalized) (M62.81);Other symptoms and signs involving cognitive function;Other (comment);Pain (decreased activity  tolerance)                Time: 9147-8295 OT Time Calculation (min): 29 min Charges:  OT General Charges $OT Visit: 1 Visit OT Evaluation $OT Eval Moderate Complexity: 1 Mod  [address] Coors Brewing., OTR/L, MA Acute Rehab [phone]   Walt Gunner 12/17/2023, 1:52 PM

## 2023-12-18 ENCOUNTER — Inpatient Hospital Stay (HOSPITAL_COMMUNITY)

## 2023-12-18 DIAGNOSIS — F03918 Unspecified dementia, unspecified severity, with other behavioral disturbance: Secondary | ICD-10-CM

## 2023-12-18 DIAGNOSIS — I6523 Occlusion and stenosis of bilateral carotid arteries: Secondary | ICD-10-CM

## 2023-12-18 DIAGNOSIS — I639 Cerebral infarction, unspecified: Secondary | ICD-10-CM | POA: Diagnosis not present

## 2023-12-18 DIAGNOSIS — E785 Hyperlipidemia, unspecified: Secondary | ICD-10-CM

## 2023-12-18 DIAGNOSIS — I480 Paroxysmal atrial fibrillation: Secondary | ICD-10-CM | POA: Diagnosis not present

## 2023-12-18 DIAGNOSIS — I6521 Occlusion and stenosis of right carotid artery: Secondary | ICD-10-CM

## 2023-12-18 DIAGNOSIS — I10 Essential (primary) hypertension: Secondary | ICD-10-CM

## 2023-12-18 DIAGNOSIS — I5041 Acute combined systolic (congestive) and diastolic (congestive) heart failure: Secondary | ICD-10-CM

## 2023-12-18 DIAGNOSIS — Z9889 Other specified postprocedural states: Secondary | ICD-10-CM

## 2023-12-18 LAB — BASIC METABOLIC PANEL WITH GFR
Anion gap: 11 (ref 5–15)
BUN: 16 mg/dL (ref 8–23)
CO2: 31 mmol/L (ref 22–32)
Calcium: 9.2 mg/dL (ref 8.9–10.3)
Chloride: 97 mmol/L — ABNORMAL LOW (ref 98–111)
Creatinine, Ser: 1.31 mg/dL — ABNORMAL HIGH (ref 0.44–1.00)
GFR, Estimated: 41 mL/min — ABNORMAL LOW (ref >60)
Glucose, Bld: 113 mg/dL — ABNORMAL HIGH (ref 70–99)
Potassium: 3.2 mmol/L — ABNORMAL LOW (ref 3.5–5.1)
Sodium: 139 mmol/L (ref 135–145)

## 2023-12-18 LAB — AMMONIA: Ammonia: 39 umol/L — ABNORMAL HIGH (ref 9–35)

## 2023-12-18 LAB — HEPATIC FUNCTION PANEL
ALT: 10 U/L (ref 0–44)
AST: 23 U/L (ref 15–41)
Albumin: 3 g/dL — ABNORMAL LOW (ref 3.5–5.0)
Alkaline Phosphatase: 49 U/L (ref 38–126)
Bilirubin, Direct: 0.3 mg/dL — ABNORMAL HIGH (ref 0.0–0.2)
Indirect Bilirubin: 1.4 mg/dL — ABNORMAL HIGH (ref 0.3–0.9)
Total Bilirubin: 1.7 mg/dL — ABNORMAL HIGH (ref 0.0–1.2)
Total Protein: 5.6 g/dL — ABNORMAL LOW (ref 6.5–8.1)

## 2023-12-18 LAB — MAGNESIUM: Magnesium: 1.4 mg/dL — ABNORMAL LOW (ref 1.7–2.4)

## 2023-12-18 LAB — HEMOGLOBIN A1C
Hgb A1c MFr Bld: 5.3 % (ref 4.8–5.6)
Mean Plasma Glucose: 105 mg/dL

## 2023-12-18 MED ORDER — MAGNESIUM SULFATE 2 GM/50ML IV SOLN
2.0000 g | Freq: Once | INTRAVENOUS | Status: AC
  Administered 2023-12-18: 2 g via INTRAVENOUS
  Filled 2023-12-18: qty 50

## 2023-12-18 MED ORDER — LORAZEPAM 2 MG/ML IJ SOLN
2.0000 mg | Freq: Once | INTRAMUSCULAR | Status: AC | PRN
  Administered 2023-12-18: 2 mg via INTRAVENOUS
  Filled 2023-12-18: qty 1

## 2023-12-18 MED ORDER — POTASSIUM CHLORIDE 20 MEQ PO PACK
40.0000 meq | PACK | Freq: Once | ORAL | Status: AC
  Administered 2023-12-18: 40 meq via ORAL
  Filled 2023-12-18: qty 2

## 2023-12-18 MED ORDER — ROSUVASTATIN CALCIUM 20 MG PO TABS
20.0000 mg | ORAL_TABLET | Freq: Every day | ORAL | Status: DC
  Administered 2023-12-18 – 2023-12-23 (×6): 20 mg via ORAL
  Filled 2023-12-18 (×6): qty 1

## 2023-12-18 MED ORDER — LEVOTHYROXINE SODIUM 75 MCG PO TABS
150.0000 ug | ORAL_TABLET | Freq: Every day | ORAL | Status: DC
  Administered 2023-12-19 – 2023-12-24 (×6): 150 ug via ORAL
  Filled 2023-12-18 (×8): qty 2

## 2023-12-18 NOTE — Progress Notes (Signed)
 STROKE TEAM PROGRESS NOTE   SUBJECTIVE (INTERVAL HISTORY) Her RN is at the bedside.  Patient drowsy sleepy but arousable, has received 2 mg Ativan  for MRA.  MRI earlier showed right frontal white matter small infarct.    OBJECTIVE Temp:  [97.6 F (36.4 C)-98.8 F (37.1 C)] 98.8 F (37.1 C) (05/12 1532) Pulse Rate:  [73-98] 87 (05/12 1532) Cardiac Rhythm: Normal sinus rhythm (05/12 0700) Resp:  [17-20] 20 (05/12 1532) BP: (104-146)/(52-74) 120/74 (05/12 1532) SpO2:  [92 %-98 %] 93 % (05/12 1532) Weight:  [73.9 kg] 73.9 kg (05/12 0402)  Recent Labs  Lab 12/16/23 1552  GLUCAP 153*   Recent Labs  Lab 12/13/23 0651 12/13/23 1518 12/14/23 0424 12/15/23 2148 12/18/23 0300  NA 139  --  139 139 139  K 4.1  --  4.5 4.3 3.2*  CL 106  --  108 108 97*  CO2 25  --  24 23 31   GLUCOSE  --   --  118* 122* 113*  BUN  --   --  12 15 16   CREATININE  --  1.25* 1.19* 1.25* 1.31*  CALCIUM   --   --  8.4* 9.1 9.2  MG  --   --   --   --  1.4*   Recent Labs  Lab 12/15/23 2148 12/18/23 1018  AST 48* 23  ALT 11 10  ALKPHOS 48 49  BILITOT 1.2 1.7*  PROT 5.8* 5.6*  ALBUMIN  3.2* 3.0*   Recent Labs  Lab 12/13/23 1518 12/14/23 0424 12/15/23 2148 12/17/23 0235  WBC 7.7 9.6 7.7 5.8  NEUTROABS  --   --  5.5  --   HGB 13.0 10.6* 11.5* 11.2*  HCT 41.7 34.1* 37.4 34.0*  MCV 90.5 91.4 92.6 87.9  PLT 172 144* 120* 182   No results for input(s): "CKTOTAL", "CKMB", "CKMBINDEX", "TROPONINI" in the last 168 hours. No results for input(s): "LABPROT", "INR" in the last 72 hours. Recent Labs    12/16/23 0749  COLORURINE COLORLESS*  LABSPEC 1.006  PHURINE 5.0  GLUCOSEU NEGATIVE  HGBUR NEGATIVE  BILIRUBINUR NEGATIVE  KETONESUR NEGATIVE  PROTEINUR NEGATIVE  NITRITE NEGATIVE  LEUKOCYTESUR NEGATIVE       Component Value Date/Time   CHOL 73 12/14/2023 0424   TRIG 109 12/14/2023 0424   HDL 22 (L) 12/14/2023 0424   CHOLHDL 3.3 12/14/2023 0424   VLDL 22 12/14/2023 0424   LDLCALC 29  12/14/2023 0424   LDLCALC 40 06/16/2023 1558   Lab Results  Component Value Date   HGBA1C 5.8 (H) 03/18/2020      Component Value Date/Time   LABOPIA NONE DETECTED 12/11/2023 1457   COCAINSCRNUR NONE DETECTED 12/11/2023 1457   LABBENZ POSITIVE (A) 12/11/2023 1457   AMPHETMU NONE DETECTED 12/11/2023 1457   THCU NONE DETECTED 12/11/2023 1457   LABBARB NONE DETECTED 12/11/2023 1457    No results for input(s): "ETH" in the last 168 hours.  I have personally reviewed the radiological images below and agree with the radiology interpretations.  VAS US  CAROTID Result Date: 12/18/2023 Carotid Arterial Duplex Study Patient Name:  Deanna [name]  Date of Exam:   12/18/2023 Medical Rec #: [id number]        Accession #:    [id number] Date of Birth: 1941/07/13        Patient Gender: F Patient Age:   83 years Exam Location:  [facility] Procedure:      VAS US  CAROTID Referring Phys: Consuelo Denmark --------------------------------------------------------------------------------  Indications:  Carotid stenosis-bilateral I65.23. Risk Factors:      Hypertension, hyperlipidemia. Other Factors:     12/13/2023 -                    ENDARTERECTOMY, CAROTID (Right)                    ANGIOPLASTY, USING PATCH GRAFT (Right: Neck). Limitations        Today's exam was limited due to the body habitus of the                    patient, heavy calcification and the resulting shadowing, the                    post surgical status of the patient and the patient's                    respiratory variation. Comparison Study:  10/09/2023 - Right Carotid: Velocities in the right ICA are                    consistent with a 80-99% stenosis. Non-hemodynamically                    significant plaque <50% noted in the CCA. The ECA appears                    >50% stenosed. Increased velocities of the right ICA compared                    to prior exam.                     Left Carotid: Patent CCA to SCA bypass graft with no evidence                     of stenosis. Velocities in the left ICA are consistent with a                    40-59%                    stenosis. Non-hemodynamically significant plaque <50% noted                    in the CCA. The ECA appears <50% stenosed.                     Vertebrals: Bilateral vertebral arteries demonstrate                    antegrade flow.                    Subclavians: Right subclavian artery was stenotic. Performing Technologist: Lerry Ransom RVT  Examination Guidelines: A complete evaluation includes B-mode imaging, spectral Doppler, color Doppler, and power Doppler as needed of all accessible portions of each vessel. Bilateral testing is considered an integral part of a complete examination. Limited examinations for reoccurring indications may be performed as noted.  Right Carotid Findings: +----------+--------+--------+--------+-----------------------+--------+           PSV cm/sEDV cm/sStenosisPlaque Description     Comments +----------+--------+--------+--------+-----------------------+--------+ CCA Prox  83      14                                              +----------+--------+--------+--------+-----------------------+--------+  CCA Distal90      12                                              +----------+--------+--------+--------+-----------------------+--------+ ICA Prox  218     50      40-59%  smooth and heterogenous         +----------+--------+--------+--------+-----------------------+--------+ ICA Mid   204     39      40-59%                                  +----------+--------+--------+--------+-----------------------+--------+ ICA Distal97      22                                              +----------+--------+--------+--------+-----------------------+--------+ ECA       53      8                                               +----------+--------+--------+--------+-----------------------+--------+  +----------+--------+-------+--------+-------------------+           PSV cm/sEDV cmsDescribeArm Pressure (mmHG) +----------+--------+-------+--------+-------------------+ [id number]                                         +----------+--------+-------+--------+-------------------+ +---------+--------+--+--------+-+---------+ VertebralPSV cm/s35EDV cm/s7Antegrade +---------+--------+--+--------+-+---------+  Left Carotid Findings: +----------+-------+-------+--------+---------------------------------+--------+           PSV    EDV    StenosisPlaque Description               Comments           cm/s   cm/s                                                     +----------+-------+-------+--------+---------------------------------+--------+ CCA Prox  58     13                                                       +----------+-------+-------+--------+---------------------------------+--------+ CCA Mid   49     21             calcific                                  +----------+-------+-------+--------+---------------------------------+--------+ CCA Distal77     25             irregular, heterogenous and  calcific                                  +----------+-------+-------+--------+---------------------------------+--------+ ICA Prox  56     30             irregular and heterogenous                +----------+-------+-------+--------+---------------------------------+--------+ ICA Mid   74     34                                                       +----------+-------+-------+--------+---------------------------------+--------+ ICA Distal38     24                                                       +----------+-------+-------+--------+---------------------------------+--------+ ECA       30     5                                                         +----------+-------+-------+--------+---------------------------------+--------+ +----------+--------+--------+--------+-------------------+           PSV cm/sEDV cm/sDescribeArm Pressure (mmHG) +----------+--------+--------+--------+-------------------+ [id number]                                          +----------+--------+--------+--------+-------------------+ +---------+--------+--+--------+--+---------+ VertebralPSV cm/s32EDV cm/s16Antegrade +---------+--------+--+--------+--+---------+   Summary: Right Carotid: Velocities in the right ICA are consistent with a 40-59%                stenosis. Left Carotid: Velocities in the left ICA are consistent with a 1-39% stenosis. Vertebrals: Bilateral vertebral arteries demonstrate antegrade flow. *See table(s) above for measurements and observations.     Preliminary    MR ANGIO HEAD WO CONTRAST Result Date: 12/18/2023 CLINICAL DATA:  Stroke, follow-up. EXAM: MRA HEAD WITHOUT CONTRAST TECHNIQUE: Angiographic images of the Circle of Willis were acquired using MRA technique without intravenous contrast. COMPARISON:  MR head 12/18/23. FINDINGS: Anterior circulation: The internal carotid arteries are within normal limits the high cervical segments through the ICA termini. Moderate focal stenosis is present in the mid left A1 segment. The distal segment scratched at the distal portion of left A1 is normal size. The vessel is diminutive proximal to this. The M1 segments are normal bilaterally. The right A1 segment is. No definite anterior communicating artery is present. The MCA bifurcations are normal bilaterally. The ACA and MCA branch vessels are otherwise normal. No aneurysm is present. Posterior circulation: The left vertebral artery is dominant. The right vertebral artery is hypoplastic and essentially terminates at the PICA. Prominent AICA vessels are noted bilaterally. Basilar artery is normal. Superior cerebellar arteries are patent. Both  posterior cerebral arteries originate from basilar tip. Mild narrowing is present in the mid left P2 segment. The PCA branch vessels are normal bilaterally. No aneurysm is present. Anatomic variants: None Other: None.  IMPRESSION: 1. Moderate focal stenosis in the mid left A1 segment. 2. Mild narrowing in the mid left P2 segment. 3. Hypoplastic right vertebral artery that essentially terminates at the PICA. 4. No other significant proximal stenosis, aneurysm, or branch vessel occlusion within the Circle of Willis. Electronically Signed   By: Audree Leas M.D.   On: 12/18/2023 15:04   MR BRAIN WO CONTRAST Result Date: 12/18/2023 CLINICAL DATA:  Neuro deficit, acute, stroke suspected. Left facial droop. EXAM: MRI HEAD WITHOUT CONTRAST TECHNIQUE: Multiplanar, multiecho pulse sequences of the brain and surrounding structures were obtained without intravenous contrast. COMPARISON:  CT 12/11/2023 FINDINGS: Brain: Diffusion imaging shows a 9 mm acute infarction in the right frontal white matter at the junction of the anterior limb internal capsule and external capsule. No other acute infarction. Mild chronic small-vessel ischemic change affects the pons. No focal cerebellar insult. Cerebral hemispheres show atrophy with moderate chronic small-vessel ischemic changes of the white matter. There is encephalomalacia and volume loss in both anterior frontal lobes which could be due to ischemic infarctions or old closed head injury. No sign of mass, recent hemorrhage, hydrocephalus or extra-axial collection. Vascular: Major vessels at the base of the brain show flow. Skull and upper cervical spine: Negative Sinuses/Orbits: Clear/normal Other: Tiny amount of mastoid fluid on the right, not likely significant. IMPRESSION: 1. 9 mm acute infarction in the right frontal white matter at the junction of the anterior limb internal capsule and external capsule. 2. Atrophy and moderate chronic small-vessel ischemic changes  elsewhere throughout the brain as outlined above. 3. Encephalomalacia and volume loss in both anterior frontal lobes which could be due to old ischemic infarctions or old closed head injury. Electronically Signed   By: Bettylou Brunner M.D.   On: 12/18/2023 11:55   ECHOCARDIOGRAM COMPLETE Result Date: 12/17/2023    ECHOCARDIOGRAM REPORT   Patient Name:   Deanna [name] Date of Exam: 12/17/2023 Medical Rec #:  [id number]       Height:       62.0 in Accession #:    [id number]      Weight:       165.1 lb Date of Birth:  12/01/1940       BSA:          1.762 m Patient Age:    83 years        BP:           121/58 mmHg Patient Gender: F               HR:           76 bpm. Exam Location:  Inpatient Procedure: 2D Echo, Cardiac Doppler, Color Doppler and Intracardiac            Opacification Agent (Both Spectral and Color Flow Doppler were            utilized during procedure). Indications:    CHF-Acute Systolic I50.21  History:        Patient has prior history of Echocardiogram examinations, most                 recent 05/09/2021. CHF, CAD and Previous Myocardial Infarction;                 Risk Factors:Hypertension, Dyslipidemia and Sleep Apnea.  Sonographer:    Denese Finn RCS Referring Phys: [id number] SAGAR H JINWALA IMPRESSIONS  1. Left ventricular ejection fraction, by estimation, is 40 to 45%. The left ventricle has mildly decreased  function. The left ventricle demonstrates regional wall motion abnormalities (see scoring diagram/findings for description). Left ventricular diastolic parameters are consistent with Grade II diastolic dysfunction (pseudonormalization). Elevated left atrial pressure.  2. Right ventricular systolic function is mildly reduced. The right ventricular size is normal. There is normal pulmonary artery systolic pressure. The estimated right ventricular systolic pressure is 26.7 mmHg.  3. Left atrial size was mildly dilated.  4. The mitral valve is degenerative. Mild mitral valve regurgitation.  Moderate mitral annular calcification.  5. The aortic valve is tricuspid. Aortic valve regurgitation is not visualized. Aortic valve sclerosis/calcification is present, without any evidence of aortic stenosis.  6. The inferior vena cava is normal in size with <50% respiratory variability, suggesting right atrial pressure of 8 mmHg. FINDINGS  Left Ventricle: Left ventricular ejection fraction, by estimation, is 40 to 45%. The left ventricle has mildly decreased function. The left ventricle demonstrates regional wall motion abnormalities. Definity  contrast agent was given IV to delineate the left ventricular endocardial borders. The left ventricular internal cavity size was normal in size. There is no left ventricular hypertrophy. Left ventricular diastolic parameters are consistent with Grade II diastolic dysfunction (pseudonormalization). Elevated left atrial pressure.  LV Wall Scoring: The apical septal segment, apical inferior segment, and apex are akinetic. The mid anteroseptal segment, mid inferoseptal segment, and mid inferior segment are hypokinetic. The entire anterior wall, entire lateral wall, basal anteroseptal segment, basal inferior segment, and basal inferoseptal segment are normal. Right Ventricle: The right ventricular size is normal. No increase in right ventricular wall thickness. Right ventricular systolic function is mildly reduced. There is normal pulmonary artery systolic pressure. The tricuspid regurgitant velocity is 2.16 m/s, and with an assumed right atrial pressure of 8 mmHg, the estimated right ventricular systolic pressure is 26.7 mmHg. Left Atrium: Left atrial size was mildly dilated. Right Atrium: Right atrial size was normal in size. Pericardium: There is no evidence of pericardial effusion. Mitral Valve: The mitral valve is degenerative in appearance. Moderate mitral annular calcification. Mild mitral valve regurgitation. Tricuspid Valve: The tricuspid valve is normal in structure.  Tricuspid valve regurgitation is trivial. Aortic Valve: The aortic valve is tricuspid. Aortic valve regurgitation is not visualized. Aortic valve sclerosis/calcification is present, without any evidence of aortic stenosis. Pulmonic Valve: The pulmonic valve was not well visualized. Pulmonic valve regurgitation is trivial. Aorta: The aortic root is normal in size and structure. Venous: The inferior vena cava is normal in size with less than 50% respiratory variability, suggesting right atrial pressure of 8 mmHg. IAS/Shunts: The atrial septum is grossly normal.  LEFT VENTRICLE PLAX 2D LVIDd:         4.20 cm   Diastology LVIDs:         3.10 cm   LV e' medial:    6.31 cm/s LV PW:         1.30 cm   LV E/e' medial:  17.1 LV IVS:        1.50 cm   LV e' lateral:   6.85 cm/s LVOT diam:     1.70 cm   LV E/e' lateral: 15.8 LV SV:         39 LV SV Index:   22 LVOT Area:     2.27 cm  RIGHT VENTRICLE RV S prime:     6.53 cm/s TAPSE (M-mode): 1.2 cm LEFT ATRIUM             Index        RIGHT ATRIUM  Index LA diam:        4.10 cm 2.33 cm/m   RA Area:     11.60 cm LA Vol (A2C):   62.9 ml 35.69 ml/m  RA Volume:   23.20 ml  13.17 ml/m LA Vol (A4C):   59.5 ml 33.77 ml/m LA Biplane Vol: 63.7 ml 36.15 ml/m  AORTIC VALVE LVOT Vmax:   87.00 cm/s LVOT Vmean:  57.100 cm/s LVOT VTI:    0.172 m  AORTA Ao Root diam: 3.10 cm MITRAL VALVE                TRICUSPID VALVE MV Area (PHT): 5.66 cm     TR Peak grad:   18.7 mmHg MV Decel Time: 134 msec     TR Vmax:        216.00 cm/s MV E velocity: 108.00 cm/s MV A velocity: 144.00 cm/s  SHUNTS MV E/A ratio:  0.75         Systemic VTI:  0.17 m                             Systemic Diam: 1.70 cm Carson Clara MD Electronically signed by Carson Clara MD Signature Date/Time: 12/17/2023/2:22:46 PM    Final    CT Angio Chest PE W and/or Wo Contrast Result Date: 12/16/2023 CLINICAL DATA:  Evaluate for pulmonary embolism. Mental status changes. EXAM: CT ANGIOGRAPHY CHEST WITH  CONTRAST TECHNIQUE: Multidetector CT imaging of the chest was performed using the standard protocol during bolus administration of intravenous contrast. Multiplanar CT image reconstructions and MIPs were obtained to evaluate the vascular anatomy. RADIATION DOSE REDUCTION: This exam was performed according to the departmental dose-optimization program which includes automated exposure control, adjustment of the mA and/or kV according to patient size and/or use of iterative reconstruction technique. CONTRAST:  75mL OMNIPAQUE  IOHEXOL  350 MG/ML SOLN COMPARISON:  11/14/2017 FINDINGS: Cardiovascular: Satisfactory opacification of the pulmonary arteries to the segmental level. No evidence of pulmonary embolism. Normal heart size. No pericardial effusion. Previous median sternotomy and CABG procedure. Aortic atherosclerosis. Vascular graft is identified within the soft tissues of the left neck Mediastinum/Nodes: Along the paramidline lower neck there is increased soft tissue edema, image 1/7. Given the recent history of right endarterectomy this is favored to represent postsurgical change. Trachea appears patent and midline. Normal appearance of the esophagus. No mediastinal or hilar adenopathy. Lungs/Pleura: Small right pleural effusion with overlying compressive type atelectasis. Pleural thickening along the posterior left lung likely reflects a dependent change. Mild bilateral interlobular septal thickening with lower lobe ground-glass attenuation. No pneumothorax or consolidative changes. Emphysema with diffuse bronchial wall thickening. Nonspecific, nodule in the left upper lobe measures 5 mm, image 50/6. Upper Abdomen: Small amount of perihepatic ascites is noted overlying the anterior right lobe. Atherosclerotic calcifications identified involving the abdominal aorta. Musculoskeletal: Status post median sternotomy. 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 No acute or  suspicious osseous findings. Review of the MIP images confirms the above findings. IMPRESSION: 1. No evidence for acute pulmonary embolus. 2. Small right pleural effusion with overlying compressive type atelectasis. 3. Mild bilateral interlobular septal thickening with lower lobe ground-glass attenuation. Findings are favored to represent mild pulmonary edema. Correlate for any signs or symptoms of mild CHF. 4. Small amount of perihepatic ascites. 5. Nonspecific, nodule in the left upper lobe measures 5 mm. If the patient is at high risk for bronchogenic carcinoma, follow-up chest  CT at 6-12 months is recommended. If the patient is at low risk for bronchogenic carcinoma, follow-up chest CT at 12 months is recommended. This recommendation follows the consensus statement: Guidelines for Management of Small Pulmonary Nodules Detected on CT Scans: A Statement from the Fleischner Society as published in Radiology 2005;237:395-400. 6. Aortic Atherosclerosis (ICD10-I70.0) and Emphysema (ICD10-J43.9). Electronically Signed   By: Kimberley Penman M.D.   On: 12/16/2023 05:38   DG Chest Port 1 View Result Date: 12/15/2023 CLINICAL DATA:  Altered mental status. EXAM: PORTABLE CHEST 1 VIEW COMPARISON:  September 12, 2023 FINDINGS: Multiple sternal wires and vascular clips are present. The heart size and mediastinal contours are within normal limits. Low lung volumes are noted. Very mild atelectasis is seen within the bilateral lung bases. There is no evidence of acute infiltrate, pleural effusion or pneumothorax. Chronic and degenerative changes seen involving both shoulders. Multilevel degenerative changes are also seen throughout the thoracic spine. IMPRESSION: 1. Evidence of prior median sternotomy/CABG. 2. Low lung volumes with very mild bibasilar atelectasis. Electronically Signed   By: Virgle Grime M.D.   On: 12/15/2023 23:14   CT HEAD WO CONTRAST ( ) Result Date: 12/11/2023 CLINICAL DATA:  Mental status change,  altered mental status. EXAM: CT HEAD WITHOUT CONTRAST TECHNIQUE: Contiguous axial images were obtained from the base of the skull through the vertex without intravenous contrast. RADIATION DOSE REDUCTION: This exam was performed according to the departmental dose-optimization program which includes automated exposure control, adjustment of the mA and/or kV according to patient size and/or use of iterative reconstruction technique. COMPARISON:  MRI head 11/16/2016.  CT head 08/16/2023. FINDINGS: Brain: No acute intracranial hemorrhage. No CT evidence of acute infarct. Nonspecific hypoattenuation in the periventricular and subcortical white matter favored to reflect chronic microvascular ischemic changes. Mild parenchymal volume loss. Similar foci of encephalomalacia in the anterior inferior right frontal lobe and in the anterior aspect of both superior frontal gyri. No edema, mass effect, or midline shift. The basilar cisterns are patent. Ventricles: Prominence of the ventricles suggesting underlying parenchymal volume loss. Vascular: Atherosclerotic calcifications of the carotid siphons. No hyperdense vessel. Skull: No acute or aggressive finding. Orbits: Orbits are symmetric. Sinuses: The visualized paranasal sinuses are clear. Other: Mastoid air cells are clear. IMPRESSION: No CT evidence of acute intracranial abnormality. Moderate chronic microvascular ischemic changes and mild parenchymal volume loss. Similar foci of encephalomalacia in the bilateral frontal lobes. Electronically Signed   By: Denny Flack M.D.   On: 12/11/2023 14:07     PHYSICAL EXAM  Temp:  [97.6 F (36.4 C)-98.8 F (37.1 C)] 98.8 F (37.1 C) (05/12 1532) Pulse Rate:  [73-98] 87 (05/12 1532) Resp:  [17-20] 20 (05/12 1532) BP: (104-146)/(52-74) 120/74 (05/12 1532) SpO2:  [92 %-98 %] 93 % (05/12 1532) Weight:  [73.9 kg] 73.9 kg (05/12 0402)  General - Well nourished, well developed, in no apparent distress but drowsy sleepy after  Ativan .  Ophthalmologic - fundi not visualized due to noncooperation.  Cardiovascular - Regular rhythm and rate.  Neuro - drowsy sleepy after Ativan , but eyes open with painful stimuli, and then patient became angry and aggressive. Asking for coffee and "I am not playing with you", did not answer orientation questions, but apparently no aphasia, no significant dysarthria, not cooperative with exam.  With eye opening, no gaze palsy, tracking bilaterally, visual field exam not corporative.  Left mild facial droop, but per RN that was chronic per daughter. Tongue not corporative. Bilateral UEs symmetrical against gravity. Bilaterally LEs  withdraw to pain seems symmetrical. Sensation, coordination and gait not tested.   ASSESSMENT/PLAN Deanna [name] is a 83 y.o. female with history of dementia, PAD, carotid stenosis, TBI, postop PAF admitted for confusion and behavior changes and left facial droop. No TNK given due to recent surgery and outside window.    Stroke:  right frontal white matter infarct, secondary to right ICA high-grade stenosis versus right CEA procedure related Patient does have worsening behavior and presented to ED 5/5.  Status post CEA on 5/7.  Daughter stated patient confusion and behavior much worse after discharge. 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 MRA moderate focal stenosis in mid left A1, mild left P2 stenosis, hypoplastic right VA terminus at PICA Carotid Doppler right ICA 40 to 59% stenosis 2D Echo EF 40 to 45% LDL 29 HgbA1c pending UDS positive for benzo (hospital medication) Lovenox  for VTE prophylaxis aspirin  81 mg daily and clopidogrel  75 mg daily prior to admission, now on aspirin  81 mg daily and clopidogrel  75 mg daily.  Continue DAPT per vascular surgery plan Ongoing aggressive stroke risk factor management Therapy recommendations:  SNF Disposition:  Pending  Carotid stenosis 10/2023 carotid Doppler showed right ICA 80 to 99% stenosis, left ICA 40 to 59% stenosis Status post right CEA on 12/13/2023 This admission carotid Doppler showed right ICA 40 to 59% stenosis, left ICA 1 to 39% stenosis On DAPT, regimen per vascular surgery  Hypertension Stable Long term BP goal normotensive  Hyperlipidemia Home meds: Crestor  40 and Zetia  10 LDL 29, goal < 70 Now on Crestor  20 and Zetia  10 Continue statin and Zetia  at discharge  Postprocedural A-fib Per cardiology note on 04/22/2020 is that atrial fibrillation was brief and noted following cardiac catheterization with conversion to normal sinus rhythm with amiodarone  and because the overall risk for recurrent atrial fibrillation was felt to be low, she was not initiated on anticoagulation.   Other Stroke Risk Factors Advanced age PAD  Other Active Problems Dementia with behavior disturbance, on Depakote  and Seroquel nightly.  Also on Lexapro  and BuSpar  TBI  Hospital day # 2  Neurology will sign off. Please call with questions. Pt will follow up with Wertman PA at [facility] in about 4 weeks. Thanks for the consult.  Consuelo Denmark, MD PhD Stroke Neurology 12/18/2023 5:44 PM    To contact Stroke Continuity provider, please refer to [email or web address]. After hours, contact General Neurology

## 2023-12-18 NOTE — Progress Notes (Signed)
 Carotid artery duplex has been completed. Preliminary results can be found in CV Proc through chart review.   12/18/23 3:51 PM Birda Buffy RVT

## 2023-12-18 NOTE — Transitions of Care (Post Inpatient/ED Visit) (Signed)
 12/18/2023  TOC RN reviewed chart related to prior discharge on 12/14/23 - Per chart patient readmitted 12/15/23 and is currently still in house. Outreach not indicated at this time  Patient ID: Deanna [name], female   DOB: 1941-02-13, 83 y.o.   MRN: [id number]

## 2023-12-18 NOTE — Consult Note (Signed)
 Cardiology Consultation   Patient ID: JESS MOTE MRN: [id number]; DOB: 29-Dec-1940  Admit date: 12/15/2023 Date of Consult: 12/18/2023  PCP:  Verma Gobble, NP    HeartCare Providers Cardiologist:  Lauro Portal, MD        Patient Profile:   Deanna [name] is a 82 y.o. 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 Charlotte Cookey), stage 3A CKD, CAD s/p stent, hypothyroidism, anxiety, recent CVA during this admission    who is being seen 12/18/2023 for the evaluation of Acute exacerbation of heart failure at the request of Dr. Melonie Square.  History of Present Illness:   Deanna [name] she was initially brought in for behavior change while being admitted in the hospital was noted to have a new facial droop then seen by neurology with imaging done MRI showing 9 mm acute infarction.  While undergoing this workup she had an echocardiogram which showed a EF of 40 to 45% at this time cardiology is being consulted to help with her care.  She was seen and examined at her bedside. Her daughter was present during my visit. She provided some history and other information transcribed from her chart.   The patient did not interact with me much.   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   CKD (chronic kidney disease), stage IV (HCC)    Cognitive impairment    Colon polyps    Community acquired pneumonia    Depression    Diverticulosis    Dysuria    Encephalomalacia    GAD (generalized anxiety disorder)     GERD (gastroesophageal reflux disease)    Glaucoma    Hearing loss    Hemorrhoids    Hypertension    Hypothyroidism    Incontinence    Mixed hyperlipidemia    Myocardial infarction (HCC)    NSTEMI (non-ST elevated myocardial infarction) (HCC) 12/19/2011   OSA on CPAP    PAF (paroxysmal atrial fibrillation) (HCC)    Peripheral arterial disease (HCC)    Pneumonitis 04/26/2015   Pyelonephritis due to Escherichia coli 04/28/2015   Sepsis (HCC)    Spondylosis of lumbar region without myelopathy or radiculopathy    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HCC)    Urticaria    Vertigo, benign positional     Past Surgical History:  Procedure Laterality Date   ABDOMINAL AORTOGRAM W/LOWER EXTREMITY N/A 08/22/2017   Procedure: ABDOMINAL AORTOGRAM W/LOWER EXTREMITY;  Surgeon: Margherita Shell, MD;  Location: [facility] INVASIVE CV LAB;  Service: Cardiovascular;  Laterality: N/A;   BILATERAL UPPER EXTREMITY ANGIOGRAM N/A 09/11/2012   Procedure: BILATERAL UPPER EXTREMITY ANGIOGRAM;  Surgeon: Margherita Shell, MD;  Location: [facility] CATH LAB;  Service: Cardiovascular;  Laterality: N/A;   BIOPSY  03/25/2020   Procedure: BIOPSY;  Surgeon: Normie Becton., MD;  Location: [facility] ENDOSCOPY;  Service: Gastroenterology;;   carotid duplex doppler Bilateral 09/03/2012, 11/03/2011   Evidence of 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 patent left common  carotid- subclavian artery bypass with turbulent flow noted at the anastomosis with velocities of 295 cm/s   CAROTID-SUBCLAVIAN BYPASS GRAFT  12/15/2011   Procedure: BYPASS GRAFT CAROTID-SUBCLAVIAN;  Surgeon: Margherita Shell, MD;  Location: [facility] OR;  Service: Vascular;  Laterality: Left;  Left Carotid subclavian bypass   CORONARY ANGIOPLASTY     CORONARY ARTERY BYPASS GRAFT     CORONARY ATHERECTOMY N/A 03/19/2020   Procedure: CORONARY  ATHERECTOMY;  Surgeon: Lucendia Rusk, MD;  Location: [facility] INVASIVE CV LAB;  Service: Cardiovascular;  Laterality: N/A;   CORONARY STENT INTERVENTION N/A 03/19/2020   Procedure: CORONARY STENT INTERVENTION;  Surgeon: Lucendia Rusk, MD;  Location: [facility] INVASIVE CV LAB;  Service: Cardiovascular;  Laterality: N/A;   DOPPLER ECHOCARDIOGRAPHY  05/27/2010, 09/17/2008   Mild Proximal septal thickening is noted. Left ventricular systolic functions is normal ejection fraction =>55%. the aortic valve appears to be mildly sclerotic    ENDARTERECTOMY Right 12/13/2023   Procedure: ENDARTERECTOMY, CAROTID;  Surgeon: Margherita Shell, MD;  Location: [facility] OR;  Service: Vascular;  Laterality: Right;   ESOPHAGOGASTRODUODENOSCOPY (EGD) WITH PROPOFOL  N/A 03/25/2020   Procedure: ESOPHAGOGASTRODUODENOSCOPY (EGD) WITH PROPOFOL ;  Surgeon: Normie Becton., MD;  Location: [facility] ENDOSCOPY;  Service: Gastroenterology;  Laterality: N/A;   fem-fem bypass graft  1999   holter monitor  01/21/2008   The predominant rhythm was normal sinus rhythm. Minimum heartrate of 63 bpm at +01:00, maximum heartrate of 105 bpm at + 10:35; and the average heartrate of 75 bpm. Ventricular ectopic activity totaled 1270: Multifocal; 866-PVC's and 404-VEs              JOINT REPLACEMENT     Left knee   LEFT HEART CATH AND CORS/GRAFTS ANGIOGRAPHY N/A 03/19/2020   Procedure: LEFT HEART CATH AND CORS/GRAFTS ANGIOGRAPHY;  Surgeon: Lucendia Rusk, MD;  Location: [facility] INVASIVE CV LAB;  Service: Cardiovascular;  Laterality: N/A;   LEFT HEART CATHETERIZATION WITH CORONARY/GRAFT ANGIOGRAM N/A 12/21/2011   Procedure: LEFT HEART CATHETERIZATION WITH Estella Helling;  Surgeon: Arleen Lacer, MD;  Location: [facility] CATH LAB;  Service: Cardiovascular;  Laterality: N/A;   NM MYOCAR PERF EJECTION FRACTION  09/22/2009, 07/03/2007   the post stress myocardial perfusion images show a normal pattern of perfusion is all regions. The post-stress ejection  fraction is 68 %. no significant wall motion abnormalities noted. This is a low risk scan.   PATCH ANGIOPLASTY Right 12/13/2023   Procedure: ANGIOPLASTY, USING PATCH GRAFT;  Surgeon: Margherita Shell, MD;  Location: [facility] OR;  Service: Vascular;  Laterality: Right;   PERIPHERAL VASCULAR INTERVENTION  08/22/2017   Procedure: PERIPHERAL VASCULAR INTERVENTION;  Surgeon: Margherita Shell, MD;  Location: [facility] INVASIVE CV LAB;  Service: Cardiovascular;;  Fem/Fem Graft   REPLACEMENT TOTAL KNEE  05-2011   UNILATERAL UPPER EXTREMEITY ANGIOGRAM Left 11/15/2011   Procedure: UNILATERAL UPPER Riki Chatters;  Surgeon: Avanell Leigh, MD;  Location: [facility] CATH LAB;  Service: Cardiovascular;  Laterality: Left;       Inpatient Medications: Scheduled Meds:  aspirin  EC  81 mg Oral QHS   busPIRone   15 mg Oral BID   cholecalciferol   5,000 Units Oral Weekly   clopidogrel   75 mg Oral QHS   divalproex   250 mg Oral QHS   enoxaparin  (LOVENOX ) injection  40 mg Subcutaneous Q24H   escitalopram   20 mg Oral Daily   ezetimibe   10 mg Oral Daily   feeding supplement  237 mL Oral BID BM   furosemide   40 mg  Intravenous Daily   [START ON 12/19/2023] levothyroxine   150 mcg Oral Q0600   metoprolol  succinate  25 mg Oral Daily   pantoprazole   40 mg Oral Daily   QUEtiapine  25 mg Oral QHS   rosuvastatin   40 mg Oral QHS   Continuous Infusions:  PRN Meds: acetaminophen  **OR** acetaminophen , albuterol , ALPRAZolam , QUEtiapine  Allergies:    Allergies  Allergen Reactions   Lidocaine  Swelling and Other (See Comments)    Use CORRECT dose for age/weight- red welts and tongue swelling result, if not   Cymbalta  [Duloxetine  Hcl] Other (See Comments)    Increased confusion and memory concerns    Donepezil Other (See Comments)    Altered mood and Aggression     Mobic [Meloxicam] Other (See Comments)    "Heart failure"- cannot take   Namenda [Memantine] Other (See Comments)    Severe aggression   Nsaids Other (See Comments)     Cannot have due to heart issues   Other Other (See Comments)    No salt or anything that might cause fluid retention/swelling!!   Oxycontin  [Oxycodone ] Other (See Comments)    "Toxic dementia" - daughter feels like she could take this, however(??)   Vicodin [Hydrocodone-Acetaminophen ] Other (See Comments)    Delirium, Confusion, and Toxic dementia    Social History:   Social History   Socioeconomic History   Marital status: Divorced    Spouse name: Not on file   Number of children: Not on file   Years of education: Not on file   Highest education level: Not on file  Occupational History   Not on file  Tobacco Use   Smoking status: Former    Current packs/day: 0.00    Types: Cigarettes    Start date: 11/28/1978    Quit date: 11/27/1981    Years since quitting: 42.0   Smokeless tobacco: Never  Vaping Use   Vaping status: Never Used  Substance and Sexual Activity   Alcohol use: Not Currently    Alcohol/week: 0.0 standard drinks of alcohol   Drug use: No   Sexual activity: Not Currently    Comment: 1st intercourse 23 yo-1 partner  Other Topics Concern   Not on file  Social History Narrative   Social History      Diet? Healthy but too many sweets      Do you drink/eat things with caffeine? Yes occasionally      Marital status?                    D                What year were you married?      Do you live in a house, apartment, assisted living, condo, trailer, etc.? condo      Is it one or more stories? 1      How many persons live in your home? 2      Do you have any pets in your home? (please list) 1 cat      Highest level of education completed? 1 year college      Current or past profession: housewife      Do you exercise?           no                           Type & how often?      Advanced Directives  Do you have a living will? no      Do you have a DNR form?             no                     If not, do you want to discuss one? yes      Do you  have signed POA/HPOA for forms? no      Functional Status      Do you have difficulty bathing or dressing yourself? No- gets tired      Do you have difficulty preparing food or eating? No- eats frozen entrees or poor nutrition meals      Do you have difficulty managing your medications? yes      Do you have difficulty managing your finances? yes      Do you have difficulty affording your medications? No- funds running low   Social Drivers of Health   Financial Resource Strain: Not on file  Food Insecurity: Food Insecurity Present (12/16/2023)   Hunger Vital Sign    Worried About Running Out of Food in the Last Year: Often true    Ran Out of Food in the Last Year: Often true  Transportation Needs: No Transportation Needs (12/16/2023)   PRAPARE - Administrator, Civil Service (Medical): No    Lack of Transportation (Non-Medical): No  Physical Activity: Not on file  Stress: Not on file  Social Connections: Moderately Isolated (12/16/2023)   Social Connection and Isolation Panel [NHANES]    Frequency of Communication with Friends and Family: Three times a week    Frequency of Social Gatherings with Friends and Family: Once a week    Attends Religious Services: 1 to 4 times per year    Active Member of Clubs or Organizations: Patient unable to answer    Attends Banker Meetings: Never    Marital Status: Divorced  Catering manager Violence: Not At Risk (12/16/2023)   Humiliation, Afraid, Rape, and Kick questionnaire    Fear of Current or Ex-Partner: No    Emotionally Abused: No    Physically Abused: No    Sexually Abused: No    Family History:    Family History  Problem Relation Age of Onset   Heart attack Mother    Heart disease Mother        before age 9   Diabetes Father    Heart disease Father    Hypertension Father    Hyperlipidemia Father    Heart attack Father 17   Heart attack Brother 69   Cerebral palsy Sister 89   Congestive Heart  Failure Sister 55   Heart attack Sister 34   Hypertension Sister 38   Dementia Sister    Anxiety disorder Sister    Anxiety disorder Sister 43   Heart Problems Sister    Stroke Sister    Heart Problems Sister 8   Heart attack Sister 15   Colon cancer Brother 45   Prostate cancer Brother 12   Hypertension Daughter    Irritable bowel syndrome Daughter    Depression Daughter    Anxiety disorder Daughter      ROS:  Please see the history of present illness.   All other ROS reviewed and negative.     Physical Exam/Data:   Vitals:   12/18/23 0402 12/18/23 0735 12/18/23 1050 12/18/23 1532  BP: (!) 142/66 104/61 (!) 112/52 120/74  Pulse: 77 73  81 87  Resp: 17 17 17 20   Temp: 98.3 F (36.8 C) 97.6 F (36.4 C) 97.8 F (36.6 C) 98.8 F (37.1 C)  TempSrc: Axillary Axillary Oral Oral  SpO2: 92% 98% 94% 93%  Weight: 73.9 kg     Height:        Intake/Output Summary (Last 24 hours) at 12/18/2023 1643 Last data filed at 12/18/2023 1309 Gross per 24 hour  Intake 177 ml  Output 700 ml  Net -523 ml      12/18/2023    4:02 AM 12/17/2023    7:04 AM 12/16/2023    1:17 PM  Last 3 Weights  Weight (lbs) 162 lb 14.7 oz 165 lb 2 oz 168 lb 14 oz  Weight (kg) 73.9 kg 74.9 kg 76.6 kg     Body mass index is 29.8 kg/m.  General:  Well nourished, well developed, in no acute distress HEENT: normal Neck: no JVD Vascular: No carotid bruits; Distal pulses 2+ bilaterally Cardiac:  normal S1, S2; RRR; no murmur  Lungs:  clear to auscultation bilaterally, no wheezing, rhonchi or rales  Abd: soft, nontender, no hepatomegaly  Ext: no edema Musculoskeletal:  No deformities, BUE and BLE strength normal and equal Skin: warm and dry  Neuro:  CNs 2-12 intact, no focal abnormalities noted Psych:  Normal affect   EKG:  The EKG was personally reviewed and demonstrates:   Telemetry:  Telemetry was personally reviewed and demonstrates:    Relevant CV Studies: echo  Laboratory Data:  High  Sensitivity Troponin:  No results for input(s): "TROPONINIHS" in the last 720 hours.   Chemistry Recent Labs  Lab 12/14/23 0424 12/15/23 2148 12/18/23 0300  NA 139 139 139  K 4.5 4.3 3.2*  CL 108 108 97*  CO2 24 23 31   GLUCOSE 118* 122* 113*  BUN 12 15 16   CREATININE 1.19* 1.25* 1.31*  CALCIUM  8.4* 9.1 9.2  MG  --   --  1.4*  GFRNONAA 46* 43* 41*  ANIONGAP 7 8 11     Recent Labs  Lab 12/15/23 2148 12/18/23 1018  PROT 5.8* 5.6*  ALBUMIN  3.2* 3.0*  AST 48* 23  ALT 11 10  ALKPHOS 48 49  BILITOT 1.2 1.7*   Lipids  Recent Labs  Lab 12/14/23 0424  CHOL 73  TRIG 109  HDL 22*  LDLCALC 29  CHOLHDL 3.3    Hematology Recent Labs  Lab 12/14/23 0424 12/15/23 2148 12/17/23 0235  WBC 9.6 7.7 5.8  RBC 3.73* 4.04 3.87  HGB 10.6* 11.5* 11.2*  HCT 34.1* 37.4 34.0*  MCV 91.4 92.6 87.9  MCH 28.4 28.5 28.9  MCHC 31.1 30.7 32.9  RDW 14.3 14.6 13.9  PLT 144* 120* 182   Thyroid   Recent Labs  Lab 12/15/23 2148 12/17/23 1721  TSH 0.236*  --   FREET4  --  1.53*    BNP Recent Labs  Lab 12/16/23 0648  BNP 1,006.4*    DDimer No results for input(s): "DDIMER" in the last 168 hours.   Radiology/Studies:  VAS US  CAROTID Result Date: 12/18/2023 Carotid Arterial Duplex Study Patient Name:  Deanna [name]  Date of Exam:   12/18/2023 Medical Rec #: [id number]        Accession #:    [id number] Date of Birth: 09/05/1940        Patient Gender: F Patient Age:   33 years Exam [facility]:  [facility] Procedure:      VAS US  CAROTID Referring Phys: Consuelo Denmark --------------------------------------------------------------------------------  Indications:       Carotid stenosis-bilateral I65.23. Risk Factors:      Hypertension, hyperlipidemia. Other Factors:     12/13/2023 -                    ENDARTERECTOMY, CAROTID (Right)                    ANGIOPLASTY, USING PATCH GRAFT (Right: Neck). Limitations        Today's exam was limited due to the body habitus of the                     patient, heavy calcification and the resulting shadowing, the                    post surgical status of the patient and the patient's                    respiratory variation. Comparison Study:  10/09/2023 - Right Carotid: Velocities in the right ICA are                    consistent with a 80-99% stenosis. Non-hemodynamically                    significant plaque <50% noted in the CCA. The ECA appears                    >50% stenosed. Increased velocities of the right ICA compared                    to prior exam.                     Left Carotid: Patent CCA to SCA bypass graft with no evidence                    of stenosis. Velocities in the left ICA are consistent with a                    40-59%                    stenosis. Non-hemodynamically significant plaque <50% noted                    in the CCA. The ECA appears <50% stenosed.                     Vertebrals: Bilateral vertebral arteries demonstrate                    antegrade flow.                    Subclavians: Right subclavian artery was stenotic. Performing Technologist: Lerry Ransom RVT  Examination Guidelines: A complete evaluation includes B-mode imaging, spectral Doppler, color Doppler, and power Doppler as needed of all accessible portions of each vessel. Bilateral testing is considered an integral part of a complete examination. Limited examinations for reoccurring indications may be performed as noted.  Right Carotid Findings: +----------+--------+--------+--------+-----------------------+--------+           PSV cm/sEDV cm/sStenosisPlaque Description     Comments +----------+--------+--------+--------+-----------------------+--------+ CCA Prox  83      14                                              +----------+--------+--------+--------+-----------------------+--------+  CCA Distal90      12                                              +----------+--------+--------+--------+-----------------------+--------+ ICA Prox   218     50      40-59%  smooth and heterogenous         +----------+--------+--------+--------+-----------------------+--------+ ICA Mid   204     39      40-59%                                  +----------+--------+--------+--------+-----------------------+--------+ ICA Distal97      22                                              +----------+--------+--------+--------+-----------------------+--------+ ECA       53      8                                               +----------+--------+--------+--------+-----------------------+--------+ +----------+--------+-------+--------+-------------------+           PSV cm/sEDV cmsDescribeArm Pressure (mmHG) +----------+--------+-------+--------+-------------------+ [id number]                                         +----------+--------+-------+--------+-------------------+ +---------+--------+--+--------+-+---------+ VertebralPSV cm/s35EDV cm/s7Antegrade +---------+--------+--+--------+-+---------+  Left Carotid Findings: +----------+-------+-------+--------+---------------------------------+--------+           PSV    EDV    StenosisPlaque Description               Comments           cm/s   cm/s                                                     +----------+-------+-------+--------+---------------------------------+--------+ CCA Prox  58     13                                                       +----------+-------+-------+--------+---------------------------------+--------+ CCA Mid   49     21             calcific                                  +----------+-------+-------+--------+---------------------------------+--------+ CCA Distal77     25             irregular, heterogenous and  calcific                                  +----------+-------+-------+--------+---------------------------------+--------+ ICA Prox  56     30              irregular and heterogenous                +----------+-------+-------+--------+---------------------------------+--------+ ICA Mid   74     34                                                       +----------+-------+-------+--------+---------------------------------+--------+ ICA Distal38     24                                                       +----------+-------+-------+--------+---------------------------------+--------+ ECA       30     5                                                        +----------+-------+-------+--------+---------------------------------+--------+ +----------+--------+--------+--------+-------------------+           PSV cm/sEDV cm/sDescribeArm Pressure (mmHG) +----------+--------+--------+--------+-------------------+ [id number]                                          +----------+--------+--------+--------+-------------------+ +---------+--------+--+--------+--+---------+ VertebralPSV cm/s32EDV cm/s16Antegrade +---------+--------+--+--------+--+---------+   Summary: Right Carotid: Velocities in the right ICA are consistent with a 40-59%                stenosis. Left Carotid: Velocities in the left ICA are consistent with a 1-39% stenosis. Vertebrals: Bilateral vertebral arteries demonstrate antegrade flow. *See table(s) above for measurements and observations.     Preliminary    MR ANGIO HEAD WO CONTRAST Result Date: 12/18/2023 CLINICAL DATA:  Stroke, follow-up. EXAM: MRA HEAD WITHOUT CONTRAST TECHNIQUE: Angiographic images of the Circle of Willis were acquired using MRA technique without intravenous contrast. COMPARISON:  MR head 12/18/23. FINDINGS: Anterior circulation: The internal carotid arteries are within normal limits the high cervical segments through the ICA termini. Moderate focal stenosis is present in the mid left A1 segment. The distal segment scratched at the distal portion of left A1 is normal size. The vessel is  diminutive proximal to this. The M1 segments are normal bilaterally. The right A1 segment is. No definite anterior communicating artery is present. The MCA bifurcations are normal bilaterally. The ACA and MCA branch vessels are otherwise normal. No aneurysm is present. Posterior circulation: The left vertebral artery is dominant. The right vertebral artery is hypoplastic and essentially terminates at the PICA. Prominent AICA vessels are noted bilaterally. Basilar artery is normal. Superior cerebellar arteries are patent. Both posterior cerebral arteries originate from basilar tip. Mild narrowing is present in the mid left P2 segment. The PCA branch vessels are normal bilaterally. No aneurysm is present. Anatomic variants: None Other: None.  IMPRESSION: 1. Moderate focal stenosis in the mid left A1 segment. 2. Mild narrowing in the mid left P2 segment. 3. Hypoplastic right vertebral artery that essentially terminates at the PICA. 4. No other significant proximal stenosis, aneurysm, or branch vessel occlusion within the Circle of Willis. Electronically Signed   By: Audree Leas M.D.   On: 12/18/2023 15:04   MR BRAIN WO CONTRAST Result Date: 12/18/2023 CLINICAL DATA:  Neuro deficit, acute, stroke suspected. Left facial droop. EXAM: MRI HEAD WITHOUT CONTRAST TECHNIQUE: Multiplanar, multiecho pulse sequences of the brain and surrounding structures were obtained without intravenous contrast. COMPARISON:  CT 12/11/2023 FINDINGS: Brain: Diffusion imaging shows a 9 mm acute infarction in the right frontal white matter at the junction of the anterior limb internal capsule and external capsule. No other acute infarction. Mild chronic small-vessel ischemic change affects the pons. No focal cerebellar insult. Cerebral hemispheres show atrophy with moderate chronic small-vessel ischemic changes of the white matter. There is encephalomalacia and volume loss in both anterior frontal lobes which could be due to ischemic  infarctions or old closed head injury. No sign of mass, recent hemorrhage, hydrocephalus or extra-axial collection. Vascular: Major vessels at the base of the brain show flow. Skull and upper cervical spine: Negative Sinuses/Orbits: Clear/normal Other: Tiny amount of mastoid fluid on the right, not likely significant. IMPRESSION: 1. 9 mm acute infarction in the right frontal white matter at the junction of the anterior limb internal capsule and external capsule. 2. Atrophy and moderate chronic small-vessel ischemic changes elsewhere throughout the brain as outlined above. 3. Encephalomalacia and volume loss in both anterior frontal lobes which could be due to old ischemic infarctions or old closed head injury. Electronically Signed   By: Bettylou Brunner M.D.   On: 12/18/2023 11:55   ECHOCARDIOGRAM COMPLETE Result Date: 12/17/2023    ECHOCARDIOGRAM REPORT   Patient Name:   Deanna [name] Date of Exam: 12/17/2023 Medical Rec #:  [id number]       Height:       62.0 in Accession #:    [id number]      Weight:       165.1 lb Date of Birth:  08-Apr-1941       BSA:          1.762 m Patient Age:    83 years        BP:           121/58 mmHg Patient Gender: F               HR:           76 bpm. Exam Location:  Inpatient Procedure: 2D Echo, Cardiac Doppler, Color Doppler and Intracardiac            Opacification Agent (Both Spectral and Color Flow Doppler were            utilized during procedure). Indications:    CHF-Acute Systolic I50.21  History:        Patient has prior history of Echocardiogram examinations, most                 recent 05/09/2021. CHF, CAD and Previous Myocardial Infarction;                 Risk Factors:Hypertension, Dyslipidemia and Sleep Apnea.  Sonographer:    Denese Finn RCS Referring Phys: [id number] SAGAR H JINWALA IMPRESSIONS  1. Left ventricular ejection fraction, by estimation, is 40 to 45%. The left ventricle has mildly decreased  function. The left ventricle demonstrates regional wall motion  abnormalities (see scoring diagram/findings for description). Left ventricular diastolic parameters are consistent with Grade II diastolic dysfunction (pseudonormalization). Elevated left atrial pressure.  2. Right ventricular systolic function is mildly reduced. The right ventricular size is normal. There is normal pulmonary artery systolic pressure. The estimated right ventricular systolic pressure is 26.7 mmHg.  3. Left atrial size was mildly dilated.  4. The mitral valve is degenerative. Mild mitral valve regurgitation. Moderate mitral annular calcification.  5. The aortic valve is tricuspid. Aortic valve regurgitation is not visualized. Aortic valve sclerosis/calcification is present, without any evidence of aortic stenosis.  6. The inferior vena cava is normal in size with <50% respiratory variability, suggesting right atrial pressure of 8 mmHg. FINDINGS  Left Ventricle: Left ventricular ejection fraction, by estimation, is 40 to 45%. The left ventricle has mildly decreased function. The left ventricle demonstrates regional wall motion abnormalities. Definity  contrast agent was given IV to delineate the left ventricular endocardial borders. The left ventricular internal cavity size was normal in size. There is no left ventricular hypertrophy. Left ventricular diastolic parameters are consistent with Grade II diastolic dysfunction (pseudonormalization). Elevated left atrial pressure.  LV Wall Scoring: The apical septal segment, apical inferior segment, and apex are akinetic. The mid anteroseptal segment, mid inferoseptal segment, and mid inferior segment are hypokinetic. The entire anterior wall, entire lateral wall, basal anteroseptal segment, basal inferior segment, and basal inferoseptal segment are normal. Right Ventricle: The right ventricular size is normal. No increase in right ventricular wall thickness. Right ventricular systolic function is mildly reduced. There is normal pulmonary artery systolic  pressure. The tricuspid regurgitant velocity is 2.16 m/s, and with an assumed right atrial pressure of 8 mmHg, the estimated right ventricular systolic pressure is 26.7 mmHg. Left Atrium: Left atrial size was mildly dilated. Right Atrium: Right atrial size was normal in size. Pericardium: There is no evidence of pericardial effusion. Mitral Valve: The mitral valve is degenerative in appearance. Moderate mitral annular calcification. Mild mitral valve regurgitation. Tricuspid Valve: The tricuspid valve is normal in structure. Tricuspid valve regurgitation is trivial. Aortic Valve: The aortic valve is tricuspid. Aortic valve regurgitation is not visualized. Aortic valve sclerosis/calcification is present, without any evidence of aortic stenosis. Pulmonic Valve: The pulmonic valve was not well visualized. Pulmonic valve regurgitation is trivial. Aorta: The aortic root is normal in size and structure. Venous: The inferior vena cava is normal in size with less than 50% respiratory variability, suggesting right atrial pressure of 8 mmHg. IAS/Shunts: The atrial septum is grossly normal.  LEFT VENTRICLE PLAX 2D LVIDd:         4.20 cm   Diastology LVIDs:         3.10 cm   LV e' medial:    6.31 cm/s LV PW:         1.30 cm   LV E/e' medial:  17.1 LV IVS:        1.50 cm   LV e' lateral:   6.85 cm/s LVOT diam:     1.70 cm   LV E/e' lateral: 15.8 LV SV:         39 LV SV Index:   22 LVOT Area:     2.27 cm  RIGHT VENTRICLE RV S prime:     6.53 cm/s TAPSE (M-mode): 1.2 cm LEFT ATRIUM             Index        RIGHT ATRIUM  Index LA diam:        4.10 cm 2.33 cm/m   RA Area:     11.60 cm LA Vol (A2C):   62.9 ml 35.69 ml/m  RA Volume:   23.20 ml  13.17 ml/m LA Vol (A4C):   59.5 ml 33.77 ml/m LA Biplane Vol: 63.7 ml 36.15 ml/m  AORTIC VALVE LVOT Vmax:   87.00 cm/s LVOT Vmean:  57.100 cm/s LVOT VTI:    0.172 m  AORTA Ao Root diam: 3.10 cm MITRAL VALVE                TRICUSPID VALVE MV Area (PHT): 5.66 cm     TR Peak grad:    18.7 mmHg MV Decel Time: 134 msec     TR Vmax:        216.00 cm/s MV E velocity: 108.00 cm/s MV A velocity: 144.00 cm/s  SHUNTS MV E/A ratio:  0.75         Systemic VTI:  0.17 m                             Systemic Diam: 1.70 cm Carson Clara MD Electronically signed by Carson Clara MD Signature Date/Time: 12/17/2023/2:22:46 PM    Final    CT Angio Chest PE W and/or Wo Contrast Result Date: 12/16/2023 CLINICAL DATA:  Evaluate for pulmonary embolism. Mental status changes. EXAM: CT ANGIOGRAPHY CHEST WITH CONTRAST TECHNIQUE: Multidetector CT imaging of the chest was performed using the standard protocol during bolus administration of intravenous contrast. Multiplanar CT image reconstructions and MIPs were obtained to evaluate the vascular anatomy. RADIATION DOSE REDUCTION: This exam was performed according to the departmental dose-optimization program which includes automated exposure control, adjustment of the mA and/or kV according to patient size and/or use of iterative reconstruction technique. CONTRAST:  75mL OMNIPAQUE  IOHEXOL  350 MG/ML SOLN COMPARISON:  11/14/2017 FINDINGS: Cardiovascular: Satisfactory opacification of the pulmonary arteries to the segmental level. No evidence of pulmonary embolism. Normal heart size. No pericardial effusion. Previous median sternotomy and CABG procedure. Aortic atherosclerosis. Vascular graft is identified within the soft tissues of the left neck Mediastinum/Nodes: Along the paramidline lower neck there is increased soft tissue edema, image 1/7. Given the recent history of right endarterectomy this is favored to represent postsurgical change. Trachea appears patent and midline. Normal appearance of the esophagus. No mediastinal or hilar adenopathy. Lungs/Pleura: Small right pleural effusion with overlying compressive type atelectasis. Pleural thickening along the posterior left lung likely reflects a dependent change. Mild bilateral interlobular septal  thickening with lower lobe ground-glass attenuation. No pneumothorax or consolidative changes. Emphysema with diffuse bronchial wall thickening. Nonspecific, nodule in the left upper lobe measures 5 mm, image 50/6. Upper Abdomen: Small amount of perihepatic ascites is noted overlying the anterior right lobe. Atherosclerotic calcifications identified involving the abdominal aorta. Musculoskeletal: Status post median sternotomy. 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 No acute or suspicious osseous findings. Review of the MIP images confirms the above findings. IMPRESSION: 1. No evidence for acute pulmonary embolus. 2. Small right pleural effusion with overlying compressive type atelectasis. 3. Mild bilateral interlobular septal thickening with lower lobe ground-glass attenuation. Findings are favored to represent mild pulmonary edema. Correlate for any signs or symptoms of mild CHF. 4. Small amount of perihepatic ascites. 5. Nonspecific, nodule in the left upper lobe measures 5 mm. If the patient is at high risk for bronchogenic carcinoma, follow-up chest  CT at 6-12 months is recommended. If the patient is at low risk for bronchogenic carcinoma, follow-up chest CT at 12 months is recommended. This recommendation follows the consensus statement: Guidelines for Management of Small Pulmonary Nodules Detected on CT Scans: A Statement from the Fleischner Society as published in Radiology 2005;237:395-400. 6. Aortic Atherosclerosis (ICD10-I70.0) and Emphysema (ICD10-J43.9). Electronically Signed   By: Kimberley Penman M.D.   On: 12/16/2023 05:38   DG Chest Port 1 View Result Date: 12/15/2023 CLINICAL DATA:  Altered mental status. EXAM: PORTABLE CHEST 1 VIEW COMPARISON:  September 12, 2023 FINDINGS: Multiple sternal wires and vascular clips are present. The heart size and mediastinal contours are within normal limits. Low lung volumes are noted. Very mild atelectasis is seen  within the bilateral lung bases. There is no evidence of acute infiltrate, pleural effusion or pneumothorax. Chronic and degenerative changes seen involving both shoulders. Multilevel degenerative changes are also seen throughout the thoracic spine. IMPRESSION: 1. Evidence of prior median sternotomy/CABG. 2. Low lung volumes with very mild bibasilar atelectasis. Electronically Signed   By: Virgle Grime M.D.   On: 12/15/2023 23:14     Assessment and Plan:   Acute stroke - continue Aspirin  81 mg daily and Crestor . She is also being followed by neurology  Acute onset of heart failure with reduced ejection fraction recent EF 40 to 45% was 50 to 55%, abnormal wall motion abnormality seen on echo done in 2022 - clinically can benefit from some IV diuretics. Will continue her current dose of Lasix  40 mg BID    Coronary artery disease status post stent/CABG - no anginal symptoms   Bilateral pleural effusion - as above will reassess after the diuretics use  Paroxysmal atrial fibrillation-unclear why not on anticoagulation at home - not on AC at this time. Which has been discussed in the past with shared decision risk and benefit. This can be addressed in the outpatient setting   PAD status post femorofemoral bypass - continue antiplatelet and statins  Coronary artery occlusion status post carotid enterectomy with bovine patch on the right side Chronic kidney disease stage III, Cr 1.31 - will monitor on cr on diuretics   Hypothyroidism - per primary team    Risk Assessment/Risk Scores:        CHA2DS2-VASc Score =     This indicates a  % annual risk of stroke. The patient's score is based upon:         For questions or updates, please contact [facility] Please consult [email or web address] for contact info under   Signed, Gennell How, DO  12/18/2023 4:43 PM

## 2023-12-18 NOTE — Progress Notes (Addendum)
 Progress Note   Patient: Deanna [name] ION:[id number] DOB: 1941/01/23 DOA: 12/15/2023     2 DOS: the patient was seen and examined on 12/18/2023   Brief hospital course: 83 y.o. 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 Charlotte Cookey), stage 3A CKD, CAD s/p stent, hypothyroidism, anxiety and depression who presented to the ED with behavioral changes.   Is being treated for CHF exacerbation, and is being evaluated for stroke due to a new left facial droop.  Assessment and Plan:  Acute CVA Staff and family noticed new mild left facial droop on 5/10. Neurology consulted.   MRI revealed a 9 mm acute infarction in the right frontal white matter at the junction of the anterior limb internal capsule and external capsule, atrophy and moderate chronic small-vessel ischemic changes, encephalomalacia and volume loss in both anterior frontal lobes.  MRA showed some minor stenotic disease. Lipid panel: TC: 73.  LDL: 29.  HDL: 22 HbA1c is pending TTE shows EF 40 to 45%, regional wall abnormalities, no shunt. Likely cause of stroke is her recent carotid surgery. -Continue home aspirin , Plavix . - Continue statin. - PT/OT. - SLP evaluation - Follow-up carotid US .  Hypoxia Acute on chronic combined systolic and diastolic HF Small right pleural effusion with adjacent atelectasis Patient primarily presenting with behavioral changes but noted to be hypoxic on arrival, requiring 2L [place].  Not significantly volume overloaded on exam though does have a small right pleural effusion with adjacent atelectasis on CT. A lso with mild bilateral interlobular septal thickening with lower lobe ground glass attenuation, favoring mild pulmonary edema.  Her BNP is elevated to 1000.  Hypoxia likely due to combination of CHF exacerbation and atelectasis.   She is on lasix  40mg  daily at home along with toprol -xl 25mg  daily. Last ECHO in 2022 with LVEF 50-55%, low normal LV function, RWMA, indeterminate diastolic parameters (though was noted to have systolic and diastolic dysfunction on ECHO in 2021). TTE repeated on 5/11 shows EF 40 to 45%, regional wall abnormalities, diastolic dysfunction. TSH suppressed at 0.236.  Free T4 elevated at 1.53.  -Will consult cardiology, given reduction in EF. - Received dose of IV lasix  40mg  in ED, will continue daily for now.  -  Will also provide ICS and attempt with mobilize OOB with PT/OT. -resume home toprol -xl 25mg  daily .- Reduce synthroid  dose as indicated below.     Dementia with behavioral changes Hx of TBI Patient with worsening cognitive decline per reports from daughter and chart review.  Seems likely that new stroke has contributed to behavioral change. She has dementia and has been tried on memantine and donepezil (discontinued due to allergic reaction/intolerance).  She will need behavioral assessment once medically cleared.  Per chart review, family unable to afford memory care facility.  Seen by PT/OT. SNF recommended.  -For SNF at discharge. - Continue PT/OT.   Sinus tachycardia Paroxysmal A fib Patient with pAF noted on chart review, not on anticoagulation at home. EKG does show mild sinus tachycardia with telemetry showing rates in the low 100s. -resumed home toprol -xl 25mg  daily -continue home aspirin , plavix  -telemetry   HTN BP ranging in the 120s-160s/80s-90s. She is on toprol -xl 25mg  daily at home. -resumed home toprol -xl -trend BP curve   PAD s/p fem-fem bypass CAD s/p stent, CABG  Carotid artery occlusion s/p R endarterectomy with bovine patch (5/7, Dr. Charlotte Cookey) -continue home  aspirin  and plavix  -continue  home crestor  and zetia    CKD 3A -baseline Cr 1.2-1.4, on admission Cr 1.25 -chronic and stable -avoid nephrotoxic medications as able -monitor UOP   Anxiety  and depression -PDMP reviewed and appropriate -resume home lexapro  20mg  daily -resume home xanax  0.5mg  daily prn -resume home depakote  250mg  at bedtime -resume home buspar  15mg  BID   Hypothyroidism TSH suppressed at 0.236.  Free T4 elevated at 1.53.  -Reduce dose of Synthroid  to 150 mcg daily and have patient follow-up as outpatient.    GERD -resume home PPI   5mm LUL lung nodule -repeat CT in 6-12 months as outpatient      Subjective: Patient underwent MRI this morning.  It showed a 9 mm acute infarction in the right frontal white matter at the junction of the anterior limb internal capsule and external capsule, atrophy and moderate chronic small-vessel ischemic changes, encephalomalacia and volume loss in both anterior frontal lobes.  MRA showed some minor stenotic disease. Patient has been seen by neurology and they recommended continuation of DAPT, statins.  Physical Exam: Vitals:   12/18/23 0402 12/18/23 0735 12/18/23 1050 12/18/23 1532  BP: (!) 142/66 104/61 (!) 112/52 120/74  Pulse: 77 73 81 87  Resp: 17 17 17 20   Temp: 98.3 F (36.8 C) 97.6 F (36.4 C) 97.8 F (36.6 C) 98.8 F (37.1 C)  TempSrc: Axillary Axillary Oral Oral  SpO2: 92% 98% 94% 93%  Weight: 73.9 kg     Height:       Physical Exam   General: Awake, alert, oriented x 1 Eyes: Pupils equal, reactive  Oral cavity: moist mucous membranes  Head: Atraumatic, normocephalic  Neck: supple  Chest: clear to auscultation. No crackles, no wheezes  CVS: S1,S2 RRR. No murmurs  Abd: No distention, soft, non-tender. No masses palpable  Extr: No edema   MSK: No joint deformities or swelling  Neurological: Grossly intact.    Data Reviewed:     Latest Ref Rng & Units 12/17/2023    2:35 AM 12/15/2023    9:48 PM 12/14/2023    4:24 AM  CBC  WBC 4.0 - 10.5 K/uL 5.8  7.7  9.6   Hemoglobin 12.0 - 15.0 g/dL 47.8  29.5  62.1   Hematocrit 36.0 - 46.0 % 34.0  37.4  34.1   Platelets 150 - 400 K/uL 182  120  144        Latest Ref Rng & Units 12/18/2023    3:00 AM 12/15/2023    9:48 PM 12/14/2023    4:24 AM  BMP  Glucose 70 - 99 mg/dL 308  657  846   BUN 8 - 23 mg/dL 16  15  12    Creatinine 0.44 - 1.00 mg/dL 9.62  9.52  8.41   Sodium 135 - 145 mmol/L 139  139  139   Potassium 3.5 - 5.1 mmol/L 3.2  4.3  4.5   Chloride 98 - 111 mmol/L 97  108  108   CO2 22 - 32 mmol/L 31  23  24    Calcium  8.9 - 10.3 mg/dL 9.2  9.1  8.4      Family Communication: Called and updated patient's daughter (5/12)  Disposition: Status is: Inpatient Remains inpatient appropriate because: Being treated for CHF exacerbation, hypoxia.   Planned Discharge Destination: Skilled nursing facility    Time spent: 35 minutes  Author: MDALA-GAUSI, Victora Irby AGATHA, MD 12/18/2023 3:41 PM  For on call review [email or web address].

## 2023-12-18 NOTE — Plan of Care (Signed)
   Problem: Education: Goal: Knowledge of General Education information will improve Description: Including pain rating scale, medication(s)/side effects and non-pharmacologic comfort measures Outcome: Not Progressing

## 2023-12-18 NOTE — Progress Notes (Signed)
 Heart Failure Navigator Progress Note  Assessed for Heart & Vascular TOC clinic readiness.  Patient does not meet criteria due to history of dementia.   Navigator will sign off.  Sharen Hones, PharmD, BCPS Heart Failure Stewardship Pharmacist Phone [phone]

## 2023-12-18 NOTE — TOC Initial Note (Signed)
 Transition of Care [facility]) - Initial/Assessment Note    Patient Details  Name: Deanna [name] MRN: [id number] Date of Birth: July 28, 1941  [facility]) CM/SW Contact:    Arron Big, LCSWA Phone Number: 12/18/2023, 10:52 AM  Clinical Narrative:   Per daily meeting with treatment team, Patient is not ready for discharge. Will work on SNF when patient is closer to being stable.   TOC will continue to follow.                 Expected Discharge Plan: Skilled Nursing Facility Barriers to Discharge: Continued Medical Work up   Patient Goals and CMS Choice Patient states their goals for this hospitalization and ongoing recovery are:: Unable to assess          Expected Discharge Plan and Services In-house Referral: Clinical Social Work     Living arrangements for the past 2 months: Apartment                                      Prior Living Arrangements/Services Living arrangements for the past 2 months: Apartment Lives with:: Self Patient language and need for interpreter reviewed:: Yes Do you feel safe going back to the place where you live?: Yes      Need for Family Participation in Patient Care: Yes (Comment)   Current home services: DME Criminal Activity/Legal Involvement Pertinent to Current Situation/Hospitalization: No - Comment as needed  Activities of Daily Living   ADL Screening (condition at time of admission) Independently performs ADLs?: No Does the patient have a NEW difficulty with bathing/dressing/toileting/self-feeding that is expected to last >3 days?: Yes (Initiates electronic notice to provider for possible OT consult) Does the patient have a NEW difficulty with getting in/out of bed, walking, or climbing stairs that is expected to last >3 days?: Yes (Initiates electronic notice to provider for possible PT consult) Does the patient have a NEW difficulty with communication that is expected to last >3 days?: No Is the patient deaf  or have difficulty hearing?: Yes (hard of hearing w/consonants) Does the patient have difficulty seeing, even when wearing glasses/contacts?: Yes (lost most vision in L eye) Does the patient have difficulty concentrating, remembering, or making decisions?: Yes  Permission Sought/Granted   Permission granted to share information with : No (Contact info for dtr on chart)              Emotional Assessment Appearance:: Appears stated age Attitude/Demeanor/Rapport: Unable to Assess Affect (typically observed): Unable to Assess Orientation: : Oriented to Self Alcohol / Substance Use: Not Applicable Psych Involvement: No (comment)  Admission diagnosis:  Lung nodule [R91.1] Hypoxia [R09.02] Cognitive and behavioral changes [R41.89, R46.89] Congestive heart failure, unspecified HF chronicity, unspecified heart failure type (HCC) [I50.9] Patient Active Problem List   Diagnosis Date Noted   Hypoxia 12/16/2023   Carotid artery stenosis, asymptomatic, right 12/13/2023   Left arm pain 08/17/2023   Delirium 08/17/2023   UTI (urinary tract infection) 08/16/2023   Anxiety 04/24/2023   Acute respiratory failure with hypoxia (HCC) 06/17/2022   COVID-19 virus infection 06/17/2022   Stage 3b chronic kidney disease (CKD) (HCC) - baseline SCr 1.8 06/17/2022   PAD (peripheral artery disease) (HCC) 10/11/2021   PAF (paroxysmal atrial fibrillation) (HCC) 10/11/2021   Hypokalemia 05/09/2021   Status post coronary artery stent placement    Overactive bladder 12/16/2019   Generalized anxiety disorder 06/17/2018  Mood disorder (HCC) 06/17/2018   Chronic diastolic CHF (congestive heart failure) (HCC) 11/16/2017   Primary open angle glaucoma of both eyes, moderate stage 11/09/2017   Risk for falls 11/22/2016   Hearing disorder 11/03/2016   Spondylosis of lumbar region without myelopathy or radiculopathy 02/25/2016   History of traumatic brain injury 11/04/2015   Primary osteoarthritis of both hips  08/17/2015   Cognitive impairment 07/08/2015   OSA on CPAP 04/26/2015   Chronic pain 04/26/2015   Physical deconditioning 04/26/2015   Hyperlipidemia 07/02/2014   Carotid artery stenosis 03/26/2012   Shortness of breath 12/19/2011    Class: Acute   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, 2011 11/28/2011   History of colonic polyps 06/18/2010   Hypothyroidism 11/17/2008   Obesity, Class III, BMI 40-49.9 (morbid obesity) (HCC) 11/17/2008   ANXIETY DEPRESSION 11/17/2008   Essential hypertension 11/17/2008   HEMORRHOIDS 11/17/2008   GERD 11/17/2008   IBS 11/17/2008   Atherosclerosis of coronary artery bypass graft(s), unspecified, with other forms of angina pectoris (HCC)    PCP:  Verma Gobble, NP Pharmacy:   [facility] [phone] Jonette Nestle, [place] - [address] AT [facility] [address] [place] [address] Phone: [phone] Fax: [phone]  [facility] [address] [place] [address] Phone: [phone] Fax: [phone]     Social Drivers of Health (SDOH) Social History: SDOH Screenings   Food Insecurity: Food Insecurity Present (12/16/2023)  Housing: High Risk (12/16/2023)  Transportation Needs: No Transportation Needs (12/16/2023)  Utilities: Not At Risk (12/16/2023)  Depression (PHQ2-9): Low Risk  (09/12/2023)  Social Connections: Moderately Isolated (12/16/2023)  Tobacco Use: Medium Risk (12/16/2023)   SDOH Interventions:     Readmission Risk Interventions    08/18/2023    2:28 PM  Readmission Risk Prevention Plan  Post Dischage Appt Complete  Medication Screening Complete  Transportation Screening Complete

## 2023-12-19 DIAGNOSIS — I509 Heart failure, unspecified: Secondary | ICD-10-CM

## 2023-12-19 DIAGNOSIS — I639 Cerebral infarction, unspecified: Secondary | ICD-10-CM | POA: Diagnosis not present

## 2023-12-19 LAB — BASIC METABOLIC PANEL WITH GFR
Anion gap: 9 (ref 5–15)
BUN: 21 mg/dL (ref 8–23)
CO2: 33 mmol/L — ABNORMAL HIGH (ref 22–32)
Calcium: 9.4 mg/dL (ref 8.9–10.3)
Chloride: 97 mmol/L — ABNORMAL LOW (ref 98–111)
Creatinine, Ser: 1.5 mg/dL — ABNORMAL HIGH (ref 0.44–1.00)
GFR, Estimated: 35 mL/min — ABNORMAL LOW (ref >60)
Glucose, Bld: 111 mg/dL — ABNORMAL HIGH (ref 70–99)
Potassium: 3.6 mmol/L (ref 3.5–5.1)
Sodium: 139 mmol/L (ref 135–145)

## 2023-12-19 LAB — CBC
HCT: 37.5 % (ref 36.0–46.0)
Hemoglobin: 12.3 g/dL (ref 12.0–15.0)
MCH: 28 pg (ref 26.0–34.0)
MCHC: 32.8 g/dL (ref 30.0–36.0)
MCV: 85.2 fL (ref 80.0–100.0)
Platelets: 214 10*3/uL (ref 150–400)
RBC: 4.4 MIL/uL (ref 3.87–5.11)
RDW: 13.6 % (ref 11.5–15.5)
WBC: 7.4 10*3/uL (ref 4.0–10.5)
nRBC: 0 % (ref 0.0–0.2)

## 2023-12-19 MED ORDER — ENOXAPARIN SODIUM 30 MG/0.3ML IJ SOSY
30.0000 mg | PREFILLED_SYRINGE | INTRAMUSCULAR | Status: DC
  Administered 2023-12-20 – 2023-12-24 (×5): 30 mg via SUBCUTANEOUS
  Filled 2023-12-19 (×5): qty 0.3

## 2023-12-19 NOTE — Progress Notes (Signed)
 Progress Note  Patient Name: Deanna [name] Date of Encounter: 12/19/2023  Primary Cardiologist: Lauro Portal, MD   Subjective   Patient seen and examined at her bedside. Hx of dementia - baseline not well known to me  Inpatient Medications    Scheduled Meds:  aspirin  EC  81 mg Oral QHS   busPIRone   15 mg Oral BID   cholecalciferol   5,000 Units Oral Weekly   clopidogrel   75 mg Oral QHS   divalproex   250 mg Oral QHS   enoxaparin  (LOVENOX ) injection  40 mg Subcutaneous Q24H   escitalopram   20 mg Oral Daily   ezetimibe   10 mg Oral Daily   feeding supplement  237 mL Oral BID BM   furosemide   40 mg Intravenous Daily   levothyroxine   150 mcg Oral Q0600   metoprolol  succinate  25 mg Oral Daily   pantoprazole   40 mg Oral Daily   QUEtiapine  25 mg Oral QHS   rosuvastatin   20 mg Oral QHS   Continuous Infusions:  PRN Meds: acetaminophen  **OR** acetaminophen , albuterol , ALPRAZolam , QUEtiapine   Vital Signs    Vitals:   12/19/23 0305 12/19/23 0306 12/19/23 0307 12/19/23 0816  BP:    106/62  Pulse:    93  Resp: (!) 21 11 17 15   Temp:    98.4 F (36.9 C)  TempSrc:    Oral  SpO2:    95%  Weight:      Height:        Intake/Output Summary (Last 24 hours) at 12/19/2023 1017 Last data filed at 12/19/2023 0817 Gross per 24 hour  Intake 1071 ml  Output 1500 ml  Net -429 ml   Filed Weights   12/17/23 0704 12/18/23 0402 12/18/23 2345  Weight: 74.9 kg 73.9 kg 72.8 kg    Telemetry     - Personally Reviewed  ECG     - Personally Reviewed  Physical Exam    General: Comfortable Head: Atraumatic, normal size  Eyes: PEERLA, EOMI  Neck: Supple, normal JVD Cardiac: Normal S1, S2; RRR; no murmurs, rubs, or gallops Lungs: Clear to auscultation bilaterally Abd: Soft, nontender, no hepatomegaly  Ext: warm, no edema Musculoskeletal: No deformities, BUE and BLE strength normal and equal Skin: Warm and dry, no rashes   Neuro: Alert and oriented to person, place, time,  and situation, CNII-XII grossly intact, no focal deficits  Psych: Normal mood and affect   Labs    Chemistry Recent Labs  Lab 12/15/23 2148 12/18/23 0300 12/18/23 1018 12/19/23 0318  NA 139 139  --  139  K 4.3 3.2*  --  3.6  CL 108 97*  --  97*  CO2 23 31  --  33*  GLUCOSE 122* 113*  --  111*  BUN 15 16  --  21  CREATININE 1.25* 1.31*  --  1.50*  CALCIUM  9.1 9.2  --  9.4  PROT 5.8*  --  5.6*  --   ALBUMIN  3.2*  --  3.0*  --   AST 48*  --  23  --   ALT 11  --  10  --   ALKPHOS 48  --  49  --   BILITOT 1.2  --  1.7*  --   GFRNONAA 43* 41*  --  35*  ANIONGAP 8 11  --  9     Hematology Recent Labs  Lab 12/15/23 2148 12/17/23 0235 12/19/23 0318  WBC 7.7 5.8 7.4  RBC 4.04 3.87  4.40  HGB 11.5* 11.2* 12.3  HCT 37.4 34.0* 37.5  MCV 92.6 87.9 85.2  MCH 28.5 28.9 28.0  MCHC 30.7 32.9 32.8  RDW 14.6 13.9 13.6  PLT 120* 182 214    Cardiac EnzymesNo results for input(s): "TROPONINI" in the last 168 hours. No results for input(s): "TROPIPOC" in the last 168 hours.   BNP Recent Labs  Lab 12/16/23 0648  BNP 1,006.4*     DDimer No results for input(s): "DDIMER" in the last 168 hours.   Radiology    VAS US  CAROTID Result Date: 12/18/2023 Carotid Arterial Duplex Study Patient Name:  AYA KLINKHAMMER  Date of Exam:   12/18/2023 Medical Rec #: [id number]        Accession #:    [id number] Date of Birth: 1941-06-02        Patient Gender: F Patient Age:   51 years Exam Location:  [facility] Procedure:      VAS US  CAROTID Referring Phys: Fraser Jackson XU --------------------------------------------------------------------------------  Indications:       Carotid stenosis-bilateral I65.23. Risk Factors:      Hypertension, hyperlipidemia. Other Factors:     12/13/2023 -                    ENDARTERECTOMY, CAROTID (Right)                    ANGIOPLASTY, USING PATCH GRAFT (Right: Neck). Limitations        Today's exam was limited due to the body habitus of the                    patient,  heavy calcification and the resulting shadowing, the                    post surgical status of the patient and the patient's                    respiratory variation. Comparison Study:  10/09/2023 - Right Carotid: Velocities in the right ICA are                    consistent with a 80-99% stenosis. Non-hemodynamically                    significant plaque <50% noted in the CCA. The ECA appears                    >50% stenosed. Increased velocities of the right ICA compared                    to prior exam.                     Left Carotid: Patent CCA to SCA bypass graft with no evidence                    of stenosis. Velocities in the left ICA are consistent with a                    40-59%                    stenosis. Non-hemodynamically significant plaque <50% noted                    in the CCA. The ECA appears <50% stenosed.  Vertebrals: Bilateral vertebral arteries demonstrate                    antegrade flow.                    Subclavians: Right subclavian artery was stenotic. Performing Technologist: Lerry Ransom RVT  Examination Guidelines: A complete evaluation includes B-mode imaging, spectral Doppler, color Doppler, and power Doppler as needed of all accessible portions of each vessel. Bilateral testing is considered an integral part of a complete examination. Limited examinations for reoccurring indications may be performed as noted.  Right Carotid Findings: +----------+--------+--------+--------+-----------------------+--------+           PSV cm/sEDV cm/sStenosisPlaque Description     Comments +----------+--------+--------+--------+-----------------------+--------+ CCA Prox  83      14                                              +----------+--------+--------+--------+-----------------------+--------+ CCA Distal90      12                                              +----------+--------+--------+--------+-----------------------+--------+ ICA Prox  218      50      40-59%  smooth and heterogenous         +----------+--------+--------+--------+-----------------------+--------+ ICA Mid   204     39      40-59%                                  +----------+--------+--------+--------+-----------------------+--------+ ICA Distal97      22                                              +----------+--------+--------+--------+-----------------------+--------+ ECA       53      8                                               +----------+--------+--------+--------+-----------------------+--------+ +----------+--------+-------+--------+-------------------+           PSV cm/sEDV cmsDescribeArm Pressure (mmHG) +----------+--------+-------+--------+-------------------+ [id number]                                         +----------+--------+-------+--------+-------------------+ +---------+--------+--+--------+-+---------+ VertebralPSV cm/s35EDV cm/s7Antegrade +---------+--------+--+--------+-+---------+  Left Carotid Findings: +----------+-------+-------+--------+---------------------------------+--------+           PSV    EDV    StenosisPlaque Description               Comments           cm/s   cm/s                                                     +----------+-------+-------+--------+---------------------------------+--------+  CCA Prox  58     13                                                       +----------+-------+-------+--------+---------------------------------+--------+ CCA Mid   49     21             calcific                                  +----------+-------+-------+--------+---------------------------------+--------+ CCA Distal77     25             irregular, heterogenous and                                               calcific                                  +----------+-------+-------+--------+---------------------------------+--------+ ICA Prox  56     30              irregular and heterogenous                +----------+-------+-------+--------+---------------------------------+--------+ ICA Mid   74     34                                                       +----------+-------+-------+--------+---------------------------------+--------+ ICA Distal38     24                                                       +----------+-------+-------+--------+---------------------------------+--------+ ECA       30     5                                                        +----------+-------+-------+--------+---------------------------------+--------+ +----------+--------+--------+--------+-------------------+           PSV cm/sEDV cm/sDescribeArm Pressure (mmHG) +----------+--------+--------+--------+-------------------+ [id number]                                          +----------+--------+--------+--------+-------------------+ +---------+--------+--+--------+--+---------+ VertebralPSV cm/s32EDV cm/s16Antegrade +---------+--------+--+--------+--+---------+   Summary: Right Carotid: Velocities in the right ICA are consistent with a 40-59%                stenosis. Left Carotid: Velocities in the left ICA are consistent with a 1-39% stenosis. Vertebrals: Bilateral vertebral arteries demonstrate antegrade flow. *See table(s) above for measurements and observations.     Preliminary    MR ANGIO HEAD WO CONTRAST Result Date: 12/18/2023  CLINICAL DATA:  Stroke, follow-up. EXAM: MRA HEAD WITHOUT CONTRAST TECHNIQUE: Angiographic images of the Circle of Willis were acquired using MRA technique without intravenous contrast. COMPARISON:  MR head 12/18/23. FINDINGS: Anterior circulation: The internal carotid arteries are within normal limits the high cervical segments through the ICA termini. Moderate focal stenosis is present in the mid left A1 segment. The distal segment scratched at the distal portion of left A1 is normal size. The vessel is  diminutive proximal to this. The M1 segments are normal bilaterally. The right A1 segment is. No definite anterior communicating artery is present. The MCA bifurcations are normal bilaterally. The ACA and MCA branch vessels are otherwise normal. No aneurysm is present. Posterior circulation: The left vertebral artery is dominant. The right vertebral artery is hypoplastic and essentially terminates at the PICA. Prominent AICA vessels are noted bilaterally. Basilar artery is normal. Superior cerebellar arteries are patent. Both posterior cerebral arteries originate from basilar tip. Mild narrowing is present in the mid left P2 segment. The PCA branch vessels are normal bilaterally. No aneurysm is present. Anatomic variants: None Other: None. IMPRESSION: 1. Moderate focal stenosis in the mid left A1 segment. 2. Mild narrowing in the mid left P2 segment. 3. Hypoplastic right vertebral artery that essentially terminates at the PICA. 4. No other significant proximal stenosis, aneurysm, or branch vessel occlusion within the Circle of Willis. Electronically Signed   By: Audree Leas M.D.   On: 12/18/2023 15:04   MR BRAIN WO CONTRAST Result Date: 12/18/2023 CLINICAL DATA:  Neuro deficit, acute, stroke suspected. Left facial droop. EXAM: MRI HEAD WITHOUT CONTRAST TECHNIQUE: Multiplanar, multiecho pulse sequences of the brain and surrounding structures were obtained without intravenous contrast. COMPARISON:  CT 12/11/2023 FINDINGS: Brain: Diffusion imaging shows a 9 mm acute infarction in the right frontal white matter at the junction of the anterior limb internal capsule and external capsule. No other acute infarction. Mild chronic small-vessel ischemic change affects the pons. No focal cerebellar insult. Cerebral hemispheres show atrophy with moderate chronic small-vessel ischemic changes of the white matter. There is encephalomalacia and volume loss in both anterior frontal lobes which could be due to ischemic  infarctions or old closed head injury. No sign of mass, recent hemorrhage, hydrocephalus or extra-axial collection. Vascular: Major vessels at the base of the brain show flow. Skull and upper cervical spine: Negative Sinuses/Orbits: Clear/normal Other: Tiny amount of mastoid fluid on the right, not likely significant. IMPRESSION: 1. 9 mm acute infarction in the right frontal white matter at the junction of the anterior limb internal capsule and external capsule. 2. Atrophy and moderate chronic small-vessel ischemic changes elsewhere throughout the brain as outlined above. 3. Encephalomalacia and volume loss in both anterior frontal lobes which could be due to old ischemic infarctions or old closed head injury. Electronically Signed   By: Bettylou Brunner M.D.   On: 12/18/2023 11:55   ECHOCARDIOGRAM COMPLETE Result Date: 12/17/2023    ECHOCARDIOGRAM REPORT   Patient Name:   SHERONA TEEPLE Date of Exam: 12/17/2023 Medical Rec #:  [id number]       Height:       62.0 in Accession #:    [id number]      Weight:       165.1 lb Date of Birth:  26-Oct-1940       BSA:          1.762 m Patient Age:    83 years        BP:  121/58 mmHg Patient Gender: F               HR:           76 bpm. Exam Location:  Inpatient Procedure: 2D Echo, Cardiac Doppler, Color Doppler and Intracardiac            Opacification Agent (Both Spectral and Color Flow Doppler were            utilized during procedure). Indications:    CHF-Acute Systolic I50.21  History:        Patient has prior history of Echocardiogram examinations, most                 recent 05/09/2021. CHF, CAD and Previous Myocardial Infarction;                 Risk Factors:Hypertension, Dyslipidemia and Sleep Apnea.  Sonographer:    Denese Finn RCS Referring Phys: [id number] SAGAR H JINWALA IMPRESSIONS  1. Left ventricular ejection fraction, by estimation, is 40 to 45%. The left ventricle has mildly decreased function. The left ventricle demonstrates regional wall motion  abnormalities (see scoring diagram/findings for description). Left ventricular diastolic parameters are consistent with Grade II diastolic dysfunction (pseudonormalization). Elevated left atrial pressure.  2. Right ventricular systolic function is mildly reduced. The right ventricular size is normal. There is normal pulmonary artery systolic pressure. The estimated right ventricular systolic pressure is 26.7 mmHg.  3. Left atrial size was mildly dilated.  4. The mitral valve is degenerative. Mild mitral valve regurgitation. Moderate mitral annular calcification.  5. The aortic valve is tricuspid. Aortic valve regurgitation is not visualized. Aortic valve sclerosis/calcification is present, without any evidence of aortic stenosis.  6. The inferior vena cava is normal in size with <50% respiratory variability, suggesting right atrial pressure of 8 mmHg. FINDINGS  Left Ventricle: Left ventricular ejection fraction, by estimation, is 40 to 45%. The left ventricle has mildly decreased function. The left ventricle demonstrates regional wall motion abnormalities. Definity  contrast agent was given IV to delineate the left ventricular endocardial borders. The left ventricular internal cavity size was normal in size. There is no left ventricular hypertrophy. Left ventricular diastolic parameters are consistent with Grade II diastolic dysfunction (pseudonormalization). Elevated left atrial pressure.  LV Wall Scoring: The apical septal segment, apical inferior segment, and apex are akinetic. The mid anteroseptal segment, mid inferoseptal segment, and mid inferior segment are hypokinetic. The entire anterior wall, entire lateral wall, basal anteroseptal segment, basal inferior segment, and basal inferoseptal segment are normal. Right Ventricle: The right ventricular size is normal. No increase in right ventricular wall thickness. Right ventricular systolic function is mildly reduced. There is normal pulmonary artery systolic  pressure. The tricuspid regurgitant velocity is 2.16 m/s, and with an assumed right atrial pressure of 8 mmHg, the estimated right ventricular systolic pressure is 26.7 mmHg. Left Atrium: Left atrial size was mildly dilated. Right Atrium: Right atrial size was normal in size. Pericardium: There is no evidence of pericardial effusion. Mitral Valve: The mitral valve is degenerative in appearance. Moderate mitral annular calcification. Mild mitral valve regurgitation. Tricuspid Valve: The tricuspid valve is normal in structure. Tricuspid valve regurgitation is trivial. Aortic Valve: The aortic valve is tricuspid. Aortic valve regurgitation is not visualized. Aortic valve sclerosis/calcification is present, without any evidence of aortic stenosis. Pulmonic Valve: The pulmonic valve was not well visualized. Pulmonic valve regurgitation is trivial. Aorta: The aortic root is normal in size and structure. Venous: The inferior vena cava is normal  in size with less than 50% respiratory variability, suggesting right atrial pressure of 8 mmHg. IAS/Shunts: The atrial septum is grossly normal.  LEFT VENTRICLE PLAX 2D LVIDd:         4.20 cm   Diastology LVIDs:         3.10 cm   LV e' medial:    6.31 cm/s LV PW:         1.30 cm   LV E/e' medial:  17.1 LV IVS:        1.50 cm   LV e' lateral:   6.85 cm/s LVOT diam:     1.70 cm   LV E/e' lateral: 15.8 LV SV:         39 LV SV Index:   22 LVOT Area:     2.27 cm  RIGHT VENTRICLE RV S prime:     6.53 cm/s TAPSE (M-mode): 1.2 cm LEFT ATRIUM             Index        RIGHT ATRIUM           Index LA diam:        4.10 cm 2.33 cm/m   RA Area:     11.60 cm LA Vol (A2C):   62.9 ml 35.69 ml/m  RA Volume:   23.20 ml  13.17 ml/m LA Vol (A4C):   59.5 ml 33.77 ml/m LA Biplane Vol: 63.7 ml 36.15 ml/m  AORTIC VALVE LVOT Vmax:   87.00 cm/s LVOT Vmean:  57.100 cm/s LVOT VTI:    0.172 m  AORTA Ao Root diam: 3.10 cm MITRAL VALVE                TRICUSPID VALVE MV Area (PHT): 5.66 cm     TR Peak grad:    18.7 mmHg MV Decel Time: 134 msec     TR Vmax:        216.00 cm/s MV E velocity: 108.00 cm/s MV A velocity: 144.00 cm/s  SHUNTS MV E/A ratio:  0.75         Systemic VTI:  0.17 m                             Systemic Diam: 1.70 cm Carson Clara MD Electronically signed by Carson Clara MD Signature Date/Time: 12/17/2023/2:22:46 PM    Final     Cardiac Studies     Patient Profile     83 y.o. 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 Charlotte Cookey), stage 3A CKD, CAD s/p stent, hypothyroidism, anxiety, recent CVA during this admission    Assessment & Plan     Acute onset of heart failure with reduced ejection fraction recent EF 40 to 45% was 50 to 55%, abnormal wall motion abnormality seen on echo done in 2022 - Clinical exam not impressive  volume overload - and in the setting of her cr bump , I will stop the IV lasix  at this time and then tomorrow if cr improves start oral diuretics.    Acute stroke - continue Aspirin  81 mg daily and Crestor . She is also being followed by neurology     Coronary artery disease status post stent/CABG - no anginal symptoms    4. Paroxysmal atrial fibrillation-unclear why not on anticoagulation at home - not  on [facility] at this time. Which has been discussed in the past with shared decision risk and benefit. This can be addressed in the outpatient setting    PAD status post femorofemoral bypass - continue antiplatelet and statins   Coronary artery occlusion status post carotid enterectomy with bovine patch on the right side  Chronic kidney disease stage III, Cr 1.5. worse than yesterday.   Hypothyroidism - per primary team      For questions or updates, please contact [facility] Please consult [email or web address] for contact info under Cardiology/STEMI.       Signed, Emmalea Treanor, DO  12/19/2023, 10:17 AM

## 2023-12-19 NOTE — Progress Notes (Addendum)
 Progress Note   Patient: Deanna [name] ZOX:[id number] DOB: 1941-01-23 DOA: 12/15/2023     3 DOS: the patient was seen and examined on 12/19/2023   Brief hospital course: 83/F w PMH of TBI, dementia, chronic combined systolic and diastolic HF, paroxysmal atrial fibrillation, PAD, 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 Charlotte Cookey), stage 3A CKD, CAD s/p stent, hypothyroidism, anxiety and depression who presented to the ED with behavioral changes and facial droop.  - Workup noted acute CVA - Also noted to have cardiomyopathy on echo, cards following -Hospitalization complicated by delirium  Subjective: Per RN some overnight confusion, mild agitation, improved this morning, previous notes of Dr.Mdala-Gausi reviewed  Assessment and Plan:  Acute CVA -Reported to have increased confusion and facial droop on admission -MRI noted acute infarct in right frontal mild white matter, atrophy and moderate chronic small-vessel ischemic changes, encephalomalacia and volume loss in both anterior frontal lobes.  MRA showed some minor stenotic disease. Lipid panel: TC: 73.  LDL: 29.  HDL: 22 HbA1c is 5.3 TTE shows EF 40 to 45%, regional wall abnormalities, no shunt. Likely cause of stroke is her recent carotid surgery. -Continue home aspirin , Plavix . - Continue statin. - PT OT SLP eval completed, SNF recommended  Acute on chronic combined CHF  - In addition to neurological symptoms, noted to be hypoxic on arrival,, CTA concerning for pulmonary edema, BNP 1000 -Last ECHO in 2022 with LVEF 50-55%, low normal LV function, RWMA, indeterminate diastolic parameters  - Repeat TTE on 5/11 with EF 40-45%, wall motion abnormality and diastolic dysfunction -Cards following, currently on IV Lasix , continue Toprol , add Aldactone tomorrow -Poor candidate for SGLT2i - Poor candidate for further workup   Dementia with behavioral changes Hx of TBI Patient  with worsening cognitive decline per reports -She has dementia and has been tried on memantine and donepezil (discontinued due to allergic reaction/intolerance).  Seen by PT/OT. SNF recommended. - May benefit from palliative care eval, will discuss with daughter   Paroxysmal A fib - not on anticoagulation at home. -resumed home regimen of Toprol , aspirin , Plavix     PAD s/p fem-fem bypass CAD s/p stent, CABG  Carotid artery occlusion s/p R endarterectomy with bovine patch (5/7, Dr. Charlotte Cookey) -continue home aspirin  and plavix  -continue  home crestor  and zetia    CKD 3A -baseline Cr 1.2-1.4, on admission Cr 1.25 -chronic and stable -avoid nephrotoxic medications as able -monitor UOP   Anxiety and depression -PDMP reviewed and appropriate - Home regimen of Lexapro , Xanax , BuSpar  and Depakote  continued  - Polypharmacy is a concern, awake and alert this morning, monitor for sedation   Hypothyroidism TSH suppressed at 0.236.  Free T4 elevated at 1.53.  -Reduce dose of Synthroid  to 150 mcg daily and have patient follow-up as outpatient.    GERD -resume home PPI   5mm LUL lung nodule -repeat CT in 6-12 months as outpatient  DVT prophylaxis: Lovenox  Full code Family communication: No family at bedside will attempt to update daughter Disposition: Plan for SNF noted  Physical Exam: Vitals:   12/19/23 0305 12/19/23 0306 12/19/23 0307 12/19/23 0816  BP:    106/62  Pulse:    93  Resp: (!) 21 11 17 15   Temp:    98.4 F (36.9 C)  TempSrc:    Oral  SpO2:    95%  Weight:      Height:       Physical Exam   General: Awake, alert, oriented x 1  Eyes: Pupils equal, reactive  Oral cavity: moist mucous membranes  Head: Atraumatic, normocephalic  Neck: supple  Chest: clear to auscultation. No crackles, no wheezes  CVS: S1,S2 RRR. No murmurs  Abd: No distention, soft, non-tender. No masses palpable  Extr: No edema   MSK: No joint deformities or swelling  Neurological: Grossly  intact.    Data Reviewed:     Latest Ref Rng & Units 12/19/2023    3:18 AM 12/17/2023    2:35 AM 12/15/2023    9:48 PM  CBC  WBC 4.0 - 10.5 K/uL 7.4  5.8  7.7   Hemoglobin 12.0 - 15.0 g/dL 82.9  56.2  13.0   Hematocrit 36.0 - 46.0 % 37.5  34.0  37.4   Platelets 150 - 400 K/uL 214  182  120       Latest Ref Rng & Units 12/19/2023    3:18 AM 12/18/2023    3:00 AM 12/15/2023    9:48 PM  BMP  Glucose 70 - 99 mg/dL 865  784  696   BUN 8 - 23 mg/dL 21  16  15    Creatinine 0.44 - 1.00 mg/dL 2.95  2.84  1.32   Sodium 135 - 145 mmol/L 139  139  139   Potassium 3.5 - 5.1 mmol/L 3.6  3.2  4.3   Chloride 98 - 111 mmol/L 97  97  108   CO2 22 - 32 mmol/L 33  31  23   Calcium  8.9 - 10.3 mg/dL 9.4  9.2  9.1     Time spent: 35 minutes  Author: Deforest Fast, MD 12/19/2023 10:07 AM  For on call review [email or web address].

## 2023-12-19 NOTE — Progress Notes (Signed)
 SLP Cancellation Note  Patient Details Name: CHANICE SNOKE MRN: [id number] DOB: 08-May-1941   Cancelled treatment:   Pt passed Swallow Screen and is doing well with diet per RN; no SLP swallow eval is warranted per protocol. Our service will sign off.  Antwoine Zorn L. Beatris Lincoln, MA CCC/SLP Clinical Specialist - Acute Care SLP Acute Rehabilitation Services Office number [phone]         Myna Asal Laurice 12/19/2023, 1:16 PM

## 2023-12-20 DIAGNOSIS — I639 Cerebral infarction, unspecified: Secondary | ICD-10-CM | POA: Diagnosis not present

## 2023-12-20 LAB — CBC
HCT: 38.3 % (ref 36.0–46.0)
Hemoglobin: 12.4 g/dL (ref 12.0–15.0)
MCH: 27.4 pg (ref 26.0–34.0)
MCHC: 32.4 g/dL (ref 30.0–36.0)
MCV: 84.7 fL (ref 80.0–100.0)
Platelets: 202 10*3/uL (ref 150–400)
RBC: 4.52 MIL/uL (ref 3.87–5.11)
RDW: 13.5 % (ref 11.5–15.5)
WBC: 5.6 10*3/uL (ref 4.0–10.5)
nRBC: 0 % (ref 0.0–0.2)

## 2023-12-20 LAB — BASIC METABOLIC PANEL WITH GFR
Anion gap: 11 (ref 5–15)
BUN: 22 mg/dL (ref 8–23)
CO2: 31 mmol/L (ref 22–32)
Calcium: 9 mg/dL (ref 8.9–10.3)
Chloride: 95 mmol/L — ABNORMAL LOW (ref 98–111)
Creatinine, Ser: 1.59 mg/dL — ABNORMAL HIGH (ref 0.44–1.00)
GFR, Estimated: 32 mL/min — ABNORMAL LOW (ref >60)
Glucose, Bld: 176 mg/dL — ABNORMAL HIGH (ref 70–99)
Potassium: 3.1 mmol/L — ABNORMAL LOW (ref 3.5–5.1)
Sodium: 137 mmol/L (ref 135–145)

## 2023-12-20 MED ORDER — QUETIAPINE FUMARATE 50 MG PO TABS
50.0000 mg | ORAL_TABLET | Freq: Every day | ORAL | Status: DC
  Administered 2023-12-20 – 2023-12-23 (×4): 50 mg via ORAL
  Filled 2023-12-20 (×4): qty 1

## 2023-12-20 MED ORDER — SENNOSIDES-DOCUSATE SODIUM 8.6-50 MG PO TABS
2.0000 | ORAL_TABLET | Freq: Every day | ORAL | Status: DC
  Administered 2023-12-20 – 2023-12-23 (×4): 2 via ORAL
  Filled 2023-12-20 (×4): qty 2

## 2023-12-20 MED ORDER — POTASSIUM CHLORIDE CRYS ER 20 MEQ PO TBCR
40.0000 meq | EXTENDED_RELEASE_TABLET | ORAL | Status: AC
Start: 2023-12-20 — End: 2023-12-20
  Administered 2023-12-20 (×2): 40 meq via ORAL
  Filled 2023-12-20 (×2): qty 2

## 2023-12-20 MED ORDER — DOCUSATE SODIUM 100 MG PO CAPS
100.0000 mg | ORAL_CAPSULE | Freq: Two times a day (BID) | ORAL | Status: DC
  Administered 2023-12-20 – 2023-12-24 (×8): 100 mg via ORAL
  Filled 2023-12-20 (×8): qty 1

## 2023-12-20 NOTE — TOC Progression Note (Addendum)
 Transition of Care [facility]) - Progression Note    Patient Details  Name: Deanna [name] MRN: [id number] Date of Birth: 1941/07/08  Transition [facility] CM/SW Contact  Arron Big, [place] Phone Number: 12/20/2023, 11:10 AM  Clinical Narrative:   Per daily meeting with treatment team, patient no longer has restraints and it has been 24 hrs since they were discontinued.   CSW spoke with [name], patients dtr, about PT recs for SNF STR. [name] is agreeable to this plan for discharge. [name] provided CSW with her email to send bed offers to [email or web address].   12:41 PM CSW emailed [email or web address] rated bed offers to [name]. Awaiting bed choice.           Skilled Nursing Rehab Facilities-   [email or web address]     Ratings out of 5 stars (5 the highest)    Name Address Phone # Quality Care Staffing Health Inspection Overall  [facility] Nursing [address], [facility] [phone] 2 2 2   [facility] [address] [place], [place] [phone] 1 2 2   [facility]** [address], [place] [phone] 2 1 1     [facility] will continue to follow.    Expected Discharge Plan: Skilled Nursing Facility Barriers to Discharge: Continued Medical Work up, SNF Pending bed offer, Insurance Authorization  Expected Discharge Plan and Services In-house Referral: Clinical Social Work     Living arrangements for the past 2 months: Apartment    Social Determinants of Health (SDOH) Interventions SDOH Screenings   Food Insecurity: Food Insecurity Present (12/16/2023)  Housing: High Risk (12/16/2023)  Transportation Needs: No Transportation Needs (12/16/2023)  Utilities: Not At Risk (12/16/2023)  Depression (PHQ2-9): Low Risk  (09/12/2023)  Social Connections: Moderately Isolated (12/16/2023)  Tobacco Use: Medium Risk (12/16/2023)    Readmission Risk Interventions    08/18/2023    2:28 PM  Readmission Risk Prevention Plan  Post Dischage Appt Complete   Medication Screening Complete  Transportation Screening Complete

## 2023-12-20 NOTE — Progress Notes (Addendum)
 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 Charlotte Cookey), stage 3A CKD, CAD s/p stent, hypothyroidism, anxiety and depression who presented to the ED with behavioral changes and facial droop.  - Workup noted acute CVA - Also noted to have cardiomyopathy on echo, cards following -Hospitalization complicated by delirium  Discharge pending SNF.   Assessment & Plan:   Active Problems:   Hypoxia  Acute CVA -Reported to have increased confusion and facial droop on admission -MRI with 9 mm acute infarction in the right frontal white matter at the junction of the anterior limb internal capsule and external capsule (atrophy and moderate chronic small vessel ischemic changes elsewhere throughout the brain - encephalomalacia and volume loss in both anterior frontal lobes) - MRA with moderate focal stenosis in the mid L A1 segment, mild narrowing in the mid L P2 segment, hypoplastic R vertebral artery that essentially terminates at the PICA HbA1c is 5.3, LDL 29 TTE shows EF 40 to 81%, regional wall abnormalities, grade 2 diastolic dysfunction, RVSF mildly reduced, no shunt. Per neurology, due to R ICA high grade stenosis vs R CEA procedure related -Continue home aspirin , Plavix .  DAPT per vascular surgery - Continue statin. - PT OT SLP eval completed, SNF recommended   Acute on chronic combined CHF  - In addition to neurological symptoms, noted to be hypoxic on arrival,, CTA concerning for pulmonary edema, BNP 1000 -Last ECHO in 2022 with LVEF 50-55%, low normal LV function, RWMA, indeterminate diastolic parameters  - Repeat TTE on 5/11  with EF 40-45%, wall motion abnormality and diastolic dysfunction -Cards following, diuresis on hold  - appreciate further recommendations   Dementia with behavioral changes Hx of TBI Patient with worsening cognitive decline per reports -She has dementia and has been tried on memantine and donepezil (discontinued due to allergic reaction/intolerance).  Seen by PT/OT. SNF recommended.   Paroxysmal Yeraldin Litzenberger fib - not on anticoagulation at home. -resumed home regimen of Toprol , aspirin , Plavix     PAD s/p fem-fem bypass CAD s/p stent, CABG  Carotid artery occlusion s/p R endarterectomy with bovine patch (5/7, Dr. Charlotte Cookey) -continue home aspirin  and plavix  -continue  home crestor  and zetia    CKD 3A -baseline Cr 1.2-1.4, on admission Cr 1.25 -creatinine rising, not meeting criteria for AKI, monitor -avoid nephrotoxic medications as able -monitor UOP   Anxiety and depression -PDMP reviewed and appropriate - Home regimen of Lexapro , Xanax , BuSpar  and Depakote  continued  - currently on seroquel  - Polypharmacy is [name] concern, awake and alert this morning, monitor for sedation   Hypothyroidism TSH suppressed at 0.236.  Free T4 elevated at 1.53.  -Reduce dose of Synthroid  to 150 mcg daily and have patient follow-up as outpatient.  GERD -resume home PPI   5 mm LUL lung nodule -repeat CT in 6-12 months as outpatient    DVT prophylaxis: lovenox  Code Status: full Family Communication: none - discussed with daughter Disposition:   Status is: Inpatient Remains inpatient appropriate because: need for inpatient care   Consultants:  Cardiology neurology  Procedures:  Carotid US  Summary:  Right Carotid: Velocities in the right ICA are consistent with [name] 40-59%  stenosis.   Left Carotid: Velocities in the left ICA are consistent with [name] 1-39%  stenosis.   Vertebrals: Bilateral vertebral arteries demonstrate antegrade flow.   Echo IMPRESSIONS     1. Left ventricular  ejection fraction, by estimation, is 40 to 45%. The  left ventricle has mildly decreased function. The left ventricle  demonstrates regional wall motion abnormalities (see scoring  diagram/findings for description). Left ventricular  diastolic parameters are consistent with Grade II diastolic dysfunction  (pseudonormalization). Elevated left atrial pressure.   2. Right ventricular systolic function is mildly reduced. The right  ventricular size is normal. There is normal pulmonary artery systolic  pressure. The estimated right ventricular systolic pressure is 26.7 mmHg.   3. Left atrial size was mildly dilated.   4. The mitral valve is degenerative. Mild mitral valve regurgitation.  Moderate mitral annular calcification.   5. The aortic valve is tricuspid. Aortic valve regurgitation is not  visualized. Aortic valve sclerosis/calcification is present, without any  evidence of aortic stenosis.   6. The inferior vena cava is normal in size with <50% respiratory  variability, suggesting right atrial pressure of 8 mmHg.    Antimicrobials:  Anti-infectives (From admission, onward)    None       Subjective: No complaints  Objective: Vitals:   12/20/23 0625 12/20/23 0900 12/20/23 1000 12/20/23 1125  BP: (!) 151/67  110/73 113/61  Pulse: 81 91  80  Resp:  (!) 22  15  Temp: 97.9 F (36.6 C)   97.7 F (36.5 C)  TempSrc: Oral   Oral  SpO2:  96%  96%  Weight: 72.6 kg     Height:        Intake/Output Summary (Last 24 hours) at 12/20/2023 1435 Last data filed at 12/20/2023 1234 Gross per 24 hour  Intake 660 ml  Output 750 ml  Net -90 ml   Filed Weights   12/18/23 0402 12/18/23 2345 12/20/23 0625  Weight: 73.9 kg 72.8 kg 72.6 kg    Examination:  General exam: Appears calm and comfortable  Respiratory system: unlabored Cardiovascular system: RRR Gastrointestinal system: Abdomen is nondistended, soft and nontender.  Central nervous system: Alert and oriented. CN 2-12  intact.  Intact symmetric strength throughout.  Normal FNF and Heel to shin bilaterally. Extremities: no LEE    Data Reviewed: I have personally reviewed following labs and imaging studies  CBC: Recent Labs  Lab 12/14/23 0424 12/15/23 2148 12/17/23 0235 12/19/23 0318 12/20/23 0225  WBC 9.6 7.7 5.8 7.4 5.6  NEUTROABS  --  5.5  --   --   --   HGB 10.6* 11.5* 11.2* 12.3 12.4  HCT 34.1* 37.4 34.0* 37.5 38.3  MCV 91.4 92.6 87.9 85.2 84.7  PLT 144* 120* 182 214 202    Basic Metabolic Panel: Recent Labs  Lab 12/14/23 0424 12/15/23 2148 12/18/23 0300 12/19/23 0318 12/20/23 0225  NA 139 139 139 139 137  K 4.5 4.3 3.2* 3.6 3.1*  CL 108 108 97* 97* 95*  CO2 24 23 31  33* 31  GLUCOSE 118* 122* 113* 111* 176*  BUN 12 15 16 21 22   CREATININE 1.19* 1.25* 1.31* 1.50* 1.59*  CALCIUM  8.4* 9.1 9.2 9.4 9.0  MG  --   --  1.4*  --   --     GFR: Estimated Creatinine Clearance: 25.5 mL/min (Kanitra Purifoy) (by C-G formula based on SCr of 1.59 mg/dL (H)).  Liver Function Tests: Recent Labs  Lab 12/15/23 2148 12/18/23 1018  AST  48* 23  ALT 11 10  ALKPHOS 48 49  BILITOT 1.2 1.7*  PROT 5.8* 5.6*  ALBUMIN  3.2* 3.0*    CBG: Recent Labs  Lab 12/16/23 1552  GLUCAP 153*     Recent Results (from the past 240 hours)  Blood culture (routine x 2)     Status: None   Collection Time: 12/11/23 12:46 PM   Specimen: BLOOD  Result Value Ref Range Status   Specimen Description BLOOD LEFT ANTECUBITAL  Final   Special Requests   Final    BOTTLES DRAWN AEROBIC ONLY Blood Culture results may not be optimal due to an inadequate volume of blood received in culture bottles   Culture   Final    NO GROWTH 5 DAYS Performed at [facility], [address], [place], [place] [address]    Report Status 12/16/2023 FINAL  Final  Urine Culture     Status: None   Collection Time: 12/11/23 12:46 PM   Specimen: Urine, Clean Catch  Result Value Ref Range Status   Specimen Description URINE, CLEAN CATCH   Final   Special Requests NONE  Final   Culture   Final    NO GROWTH Performed at [facility], [address] [place], [place] [address]    Report Status 12/12/2023 FINAL  Final  Blood culture (routine x 2)     Status: None   Collection Time: 12/11/23 12:50 PM   Specimen: BLOOD LEFT ARM  Result Value Ref Range Status   Specimen Description BLOOD LEFT ARM  Final   Special Requests   Final    BOTTLES DRAWN AEROBIC AND ANAEROBIC Blood Culture adequate volume   Culture   Final    NO GROWTH 5 DAYS Performed at [facility], [address], [place], [place] [address]    Report Status 12/16/2023 FINAL  Final         Radiology Studies: VAS US  CAROTID Result Date: 12/18/2023 Carotid Arterial Duplex Study Patient Name:  Deanna [name]  Date of Exam:   12/18/2023 Medical Rec #: [id number]        Accession #:    [id number] Date of Birth: 09-Jul-1941        Patient Gender: F Patient Age:   62 years Exam Location:  [facility] Procedure:      VAS US  CAROTID Referring Phys: Fraser Jackson XU --------------------------------------------------------------------------------  Indications:       Carotid stenosis-bilateral I65.23. Risk Factors:      Hypertension, hyperlipidemia. Other Factors:     12/13/2023 -                    ENDARTERECTOMY, CAROTID (Right)                    ANGIOPLASTY, USING PATCH GRAFT (Right: Neck). Limitations        Today's exam was limited due to the body habitus of the                    patient, heavy calcification and the resulting shadowing, the                    post surgical status of the patient and the patient's                    respiratory variation. Comparison Study:  10/09/2023 - Right Carotid: Velocities in the right ICA are  consistent with Lucianna Ostlund 80-99% stenosis. Non-hemodynamically                    significant plaque <50% noted in the CCA. The ECA appears                    >50% stenosed. Increased velocities of the right ICA compared                     to prior exam.                     Left Carotid: Patent CCA to SCA bypass graft with no evidence                    of stenosis. Velocities in the left ICA are consistent with Jacy Howat                    40-59%                    stenosis. Non-hemodynamically significant plaque <50% noted                    in the CCA. The ECA appears <50% stenosed.                     Vertebrals: Bilateral vertebral arteries demonstrate                    antegrade flow.                    Subclavians: Right subclavian artery was stenotic. Performing Technologist: Lerry Ransom RVT  Examination Guidelines: Daran Favaro complete evaluation includes B-mode imaging, spectral Doppler, color Doppler, and power Doppler as needed of all accessible portions of each vessel. Bilateral testing is considered an integral part of Icelynn Onken complete examination. Limited examinations for reoccurring indications may be performed as noted.  Right Carotid Findings: +----------+--------+--------+--------+-----------------------+--------+           PSV cm/sEDV cm/sStenosisPlaque Description     Comments +----------+--------+--------+--------+-----------------------+--------+ CCA Prox  83      14                                              +----------+--------+--------+--------+-----------------------+--------+ CCA Distal90      12                                              +----------+--------+--------+--------+-----------------------+--------+ ICA Prox  218     50      40-59%  smooth and heterogenous         +----------+--------+--------+--------+-----------------------+--------+ ICA Mid   204     39      40-59%                                  +----------+--------+--------+--------+-----------------------+--------+ ICA Distal97      22                                              +----------+--------+--------+--------+-----------------------+--------+  ECA       53      8                                                +----------+--------+--------+--------+-----------------------+--------+ +----------+--------+-------+--------+-------------------+           PSV cm/sEDV cmsDescribeArm Pressure (mmHG) +----------+--------+-------+--------+-------------------+ [id number]                                         +----------+--------+-------+--------+-------------------+ +---------+--------+--+--------+-+---------+ VertebralPSV cm/s35EDV cm/s7Antegrade +---------+--------+--+--------+-+---------+  Left Carotid Findings: +----------+-------+-------+--------+---------------------------------+--------+           PSV    EDV    StenosisPlaque Description               Comments           cm/s   cm/s                                                     +----------+-------+-------+--------+---------------------------------+--------+ CCA Prox  58     13                                                       +----------+-------+-------+--------+---------------------------------+--------+ CCA Mid   49     21             calcific                                  +----------+-------+-------+--------+---------------------------------+--------+ CCA Distal77     25             irregular, heterogenous and                                               calcific                                  +----------+-------+-------+--------+---------------------------------+--------+ ICA Prox  56     30             irregular and heterogenous                +----------+-------+-------+--------+---------------------------------+--------+ ICA Mid   74     34                                                       +----------+-------+-------+--------+---------------------------------+--------+ ICA Distal38     24                                                       +----------+-------+-------+--------+---------------------------------+--------+  ECA       30     5                                                         +----------+-------+-------+--------+---------------------------------+--------+ +----------+--------+--------+--------+-------------------+           PSV cm/sEDV cm/sDescribeArm Pressure (mmHG) +----------+--------+--------+--------+-------------------+ [id number]                                          +----------+--------+--------+--------+-------------------+ +---------+--------+--+--------+--+---------+ VertebralPSV cm/s32EDV cm/s16Antegrade +---------+--------+--+--------+--+---------+   Summary: Right Carotid: Velocities in the right ICA are consistent with Demetric Parslow 40-59%                stenosis. Left Carotid: Velocities in the left ICA are consistent with Bodey Frizell 1-39% stenosis. Vertebrals: Bilateral vertebral arteries demonstrate antegrade flow. *See table(s) above for measurements and observations.     Preliminary         Scheduled Meds:  aspirin  EC  81 mg Oral QHS   busPIRone   15 mg Oral BID   cholecalciferol   5,000 Units Oral Weekly   clopidogrel   75 mg Oral QHS   divalproex   250 mg Oral QHS   enoxaparin  (LOVENOX ) injection  30 mg Subcutaneous Q24H   escitalopram   20 mg Oral Daily   ezetimibe   10 mg Oral Daily   feeding supplement  237 mL Oral BID BM   levothyroxine   150 mcg Oral Q0600   metoprolol  succinate  25 mg Oral Daily   pantoprazole   40 mg Oral Daily   QUEtiapine  25 mg Oral QHS   rosuvastatin   20 mg Oral QHS   Continuous Infusions:   LOS: 4 days    Time spent: over 30 min    Donnetta Gains, MD [facility]   To contact the attending provider between 7A-7P or the covering provider during after hours 7P-7A, please log into the web site [email or web address] and access using universal [place] password for that web site. If you do not have the password, please call the hospital operator.  12/20/2023, 2:35 PM

## 2023-12-20 NOTE — [place] FL2 (Signed)
   MEDICAID [id number] LEVEL OF CARE FORM     IDENTIFICATION  Patient Name: Deanna [name] Birthdate: 12/28/1940 Sex: female Admission Date (Current Location): 12/15/2023  [facility] and [place] Number:  Producer, television/film/video and Address:  [facility] [place], [address], [place], [place] [address]      Provider Number: [id number]  Attending Physician Name and Address:  Etter Hermann., *  Relative Name and Phone Number:       Current Level of Care: Hospital Recommended Level of Care: Skilled Nursing Facility Prior Approval Number:    Date Approved/Denied:   PASRR Number: [id number]  Discharge Plan: SNF    Current Diagnoses: Patient Active Problem List   Diagnosis Date Noted   Hypoxia 12/16/2023   Carotid artery stenosis, asymptomatic, right 12/13/2023   Left arm pain 08/17/2023   Delirium 08/17/2023   UTI (urinary tract infection) 08/16/2023   Anxiety 04/24/2023   Acute respiratory failure with hypoxia (HCC) 06/17/2022   COVID-19 virus infection 06/17/2022   Stage 3b chronic kidney disease (CKD) (HCC) - baseline SCr 1.8 06/17/2022   PAD (peripheral artery disease) (HCC) 10/11/2021   PAF (paroxysmal atrial fibrillation) (HCC) 10/11/2021   Hypokalemia 05/09/2021   Status post coronary artery stent placement    Overactive bladder 12/16/2019   Generalized anxiety disorder 06/17/2018   Mood disorder (HCC) 06/17/2018   Chronic diastolic CHF (congestive heart failure) (HCC) 11/16/2017   Primary open angle glaucoma of both eyes, moderate stage 11/09/2017   Risk for falls 11/22/2016   Hearing disorder 11/03/2016   Spondylosis of lumbar region without myelopathy or radiculopathy 02/25/2016   History of traumatic brain injury 11/04/2015   Primary osteoarthritis of both hips 08/17/2015   Cognitive impairment 07/08/2015   OSA on CPAP 04/26/2015   Chronic pain 04/26/2015   Physical deconditioning 04/26/2015   Hyperlipidemia 07/02/2014   Carotid  artery stenosis 03/26/2012   Shortness of breath 12/19/2011   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, 2011 11/28/2011   History of colonic polyps 06/18/2010   Hypothyroidism 11/17/2008   Obesity, Class III, BMI 40-49.9 (morbid obesity) (HCC) 11/17/2008   ANXIETY DEPRESSION 11/17/2008   Essential hypertension 11/17/2008   HEMORRHOIDS 11/17/2008   GERD 11/17/2008   IBS 11/17/2008   Atherosclerosis of coronary artery bypass graft(s), unspecified, with other forms of angina pectoris (HCC)     Orientation RESPIRATION BLADDER Height & Weight     Self  Normal   Weight: 160 lb 1.6 oz (72.6 kg) Height:  5\' 2"  (157.5 cm)  BEHAVIORAL SYMPTOMS/MOOD NEUROLOGICAL BOWEL NUTRITION STATUS        Diet (see dc summary)  AMBULATORY STATUS COMMUNICATION OF NEEDS Skin   Extensive Assist Verbally Other (Comment) (Incision (Closed) 12/15/23 Neck Right)                       Personal Care Assistance Level of Assistance  Bathing, Dressing, Feeding Bathing Assistance: Maximum assistance Feeding assistance: Maximum assistance Dressing Assistance: Maximum assistance     Functional Limitations Info  Sight, Hearing, Speech Sight Info: Impaired (eyeglasses) Hearing Info: Adequate Speech Info: Adequate    SPECIAL CARE FACTORS FREQUENCY  PT (By licensed PT), OT (By licensed OT)     PT Frequency: 5x week OT Frequency: 5x week            Contractures Contractures Info: Not present    Additional Factors Info  Code Status, Allergies, Psychotropic Code Status Info: Full Allergies  Info: Lidocaine , Cymbalta  (Duloxetine  Hcl), Donepezil, Mobic (Meloxicam), Namenda (Memantine), Nsaids, Other, Oxycontin  (Oxycodone ), Vicodin (Hydrocodone-acetaminophen ) Psychotropic Info: Buspar , Depakote  ER, lexapro , Seroquel         Current Medications (12/20/2023):  This is the current hospital active medication list Current Facility-Administered Medications  Medication Dose Route Frequency Provider Last  Rate Last Admin   acetaminophen  (TYLENOL ) tablet 650 mg  650 mg Oral Q6H PRN Jinwala, Sagar H, MD   650 mg at 12/19/23 1639   Or   acetaminophen  (TYLENOL ) suppository 650 mg  650 mg Rectal Q6H PRN Jinwala, Sagar H, MD       albuterol  (PROVENTIL ) (2.5 MG/3ML) 0.083% nebulizer solution 2.5 mg  2.5 mg Inhalation Q6H PRN Jinwala, Sagar H, MD       ALPRAZolam  (XANAX ) tablet 0.5 mg  0.5 mg Oral Daily PRN Eldon Greenland, MD   0.5 mg at 12/18/23 2322   aspirin  EC tablet 81 mg  81 mg Oral QHS Jinwala, Sagar H, MD   81 mg at 12/19/23 2202   busPIRone  (BUSPAR ) tablet 15 mg  15 mg Oral BID Jinwala, Sagar H, MD   15 mg at 12/20/23 1002   cholecalciferol  (VITAMIN D3) 25 MCG (1000 UNIT) tablet 5,000 Units  5,000 Units Oral Weekly Eldon Greenland, MD   5,000 Units at 12/18/23 1336   clopidogrel  (PLAVIX ) tablet 75 mg  75 mg Oral QHS Eldon Greenland, MD   75 mg at 12/19/23 2201   divalproex  (DEPAKOTE  ER) 24 hr tablet 250 mg  250 mg Oral QHS Eldon Greenland, MD   250 mg at 12/19/23 2202   enoxaparin  (LOVENOX ) injection 30 mg  30 mg Subcutaneous Q24H Deforest Fast, MD   30 mg at 12/20/23 1013   escitalopram  (LEXAPRO ) tablet 20 mg  20 mg Oral Daily Eldon Greenland, MD   20 mg at 12/20/23 1005   ezetimibe  (ZETIA ) tablet 10 mg  10 mg Oral Daily Eldon Greenland, MD   10 mg at 12/20/23 1006   feeding supplement (ENSURE ENLIVE / ENSURE PLUS) liquid 237 mL  237 mL Oral BID BM Jinwala, Sagar H, MD   237 mL at 12/18/23 1334   levothyroxine  (SYNTHROID ) tablet 150 mcg  150 mcg Oral Q0600 Mdala-Gausi, Masiku Agatha, MD   150 mcg at 12/20/23 1610   metoprolol  succinate (TOPROL -XL) 24 hr tablet 25 mg  25 mg Oral Daily Jinwala, Sagar H, MD   25 mg at 12/20/23 1006   pantoprazole  (PROTONIX ) EC tablet 40 mg  40 mg Oral Daily Jinwala, Sagar H, MD   40 mg at 12/20/23 1006   potassium chloride  SA (KLOR-CON  M) CR tablet 40 mEq  40 mEq Oral Q4H Etter Hermann., MD   40 mEq at 12/20/23 1012   QUEtiapine (SEROQUEL) tablet 25  mg  25 mg Oral QHS Mdala-Gausi, Masiku Agatha, MD   25 mg at 12/19/23 2201   QUEtiapine (SEROQUEL) tablet 25 mg  25 mg Oral BID PRN Mdala-Gausi, Masiku Agatha, MD   25 mg at 12/19/23 1638   rosuvastatin  (CRESTOR ) tablet 20 mg  20 mg Oral QHS Consuelo Denmark, MD   20 mg at 12/19/23 2201     Discharge Medications: Please see discharge summary for a list of discharge medications.  Relevant Imaging Results:  Relevant Lab Results:   Additional Information [id number]  Arron Big, LCSWA

## 2023-12-20 NOTE — Progress Notes (Signed)
 Mobility Specialist: Progress Note   12/20/23 1617  Mobility  Activity Ambulated with assistance in hallway  Level of Assistance Contact guard assist, steadying assist  Assistive Device Front wheel walker  Distance Ambulated (ft) 80 ft  Activity Response Tolerated well  Mobility Referral Yes  Mobility visit 1 Mobility  Mobility Specialist Start Time (ACUTE ONLY) 1415  Mobility Specialist Stop Time (ACUTE ONLY) 1429  Mobility Specialist Time Calculation (min) (ACUTE ONLY) 14 min    Pt was very pleasant and agreeable to mobility session - received in bed. MinA for bed mobility to assist wit trunk elevation. CG for STS with cues for hand placement. CG for ambulation. C/o feeling weak. Returned to room without fault. Left in bed with all needs met, call bell in reach. Sitter in room.   Deanna [name] Mobility Specialist Please contact via SecureChat or Rehab office at [phone]

## 2023-12-20 NOTE — Progress Notes (Signed)
 Occupational Therapy Treatment Patient Details Name: Deanna [name] MRN: [id number] DOB: 1940/12/03 Today's Date: 12/20/2023   History of present illness Deanna [name] is a 83 y.o. female admitted 12/15/23 for AMS. Pt presented with behavioral changes including worsening confusion and paranoia. 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 PMH of TBI, dementia, paroxysmal atrial fibrillation, PAD, recent right carotid artery endarterectomy and angioplasty on 12/13/2023, hypothyroidism, CKD, CAD s/p stent, anxiety and depression.   OT comments  OT session largely focused on training in techniques for increased safety and independence with ADL and bed mobility and functional mobility during/in preparation for functional tasks, including pt functional cognition during tasks. Pt currently demonstrating ability to largely complete UB ADLs with Supervision to Min assist, LB ADLs with Min to Mod assist, bed mobility, with Supervision to Min assist, and functional transfers/mobility with a RW with Contact guard assist. Pt required Min cues throughout tasks, for technique, safety, sequencing, and initiation. OT also instructed pt in L shoulder AROM/AAROM therapeutic exercises (see details below) with pt reports in no pain in L shoulder on this day. Pt with HR briefly elevated to 111 bpm during functional mobility with HR quickly recovering intpo the 90s with MD notified and otherwise with VSS on RA throughout session. Pt participated well in session, is motivated to continue to participate in rehab, and is making good progress toward goals. Pt will benefit from continued acute skilled OT services to address deficits outlined below and increase safety and independence with functional tasks. Post acute discharge, pt will benefit from intensive inpatient skilled rehab services < 3 hours per day to maximize rehab potential. Pt may be  able to progress to [facility] OT if family is able to provide 24/7 supervision/assistance and able to provide/comfortable with providing level of assist pt currently needs.       If plan is discharge home, recommend the following:  A little help with walking and/or transfers;A lot of help with bathing/dressing/bathroom;Assistance with cooking/housework;Direct supervision/assist for medications management;Direct supervision/assist for financial management;Assist for transportation;Help with stairs or ramp for entrance;Supervision due to cognitive status   Equipment Recommendations  Other (comment) (TBD - uncertain of what equipment pt already has)    Recommendations for Other Services      Precautions / Restrictions Precautions Precautions: Fall Recall of Precautions/Restrictions: Impaired Restrictions Weight Bearing Restrictions Per Provider Order: No       Mobility Bed Mobility Overal bed mobility: Needs Assistance Bed Mobility: Supine to Sit, Sit to Supine     Supine to sit: Supervision, Contact guard, HOB elevated Sit to supine: Min assist   General bed mobility comments: Largely Supervision for Supine to Sit with CGA and cues to bring hips to [facility]; Min assist to elevated B LE into bed in sit to supine    Transfers Overall transfer level: Needs assistance Equipment used: Rolling walker (2 wheels) Transfers: Sit to/from Stand, Bed to chair/wheelchair/BSC Sit to Stand: Contact guard assist     Step pivot transfers: Contact guard assist     General transfer comment: Min cues for hand placement/technique and sequencing     Balance Overall balance assessment: Needs assistance Sitting-balance support: Single extremity supported, No upper extremity supported, Feet supported Sitting balance-Leahy Scale: Fair Sitting balance - Comments: Pt sitting EOB with intermittent unilateral UE support with Supervision   Standing balance support: Single extremity supported, Bilateral upper  extremity supported, During functional activity Standing  balance-Leahy Scale: Poor Standing balance comment: reliant on external support of RW or counter at sink this session with CGA for safety                           ADL either performed or assessed with clinical judgement   ADL Overall ADL's : Needs assistance/impaired     Grooming: Wash/dry hands;Wash/dry face;Oral care;Supervision/safety;Contact guard assist;Minimal assistance;Cueing for sequencing;Standing Grooming Details (indicate cue type and reason): assist for opening containers/packaging (toothbrush and toothbrush), otherwise Supervision with cues for sequencing for tasks and CGA for standing balance; pt required one sitting rest break of approx. 1 minute during tasks due to fatigue with VSS remaining stable on RA             Lower Body Dressing: Minimal assistance;Moderate assistance;Cueing for sequencing;Cueing for compensatory techniques;Sit to/from stand   Toilet Transfer: Contact guard assist;Cueing for safety;Cueing for sequencing;Ambulation;BSC/3in1;Rolling walker (2 wheels) Toilet Transfer Details (indicate cue type and reason): simulated bed<->chair         Functional mobility during ADLs: Contact guard assist;Rolling walker (2 wheels);Cueing for sequencing;Cueing for safety General ADL Comments: Pt eager to participate in grooming tasks at sink this session with pt reporting she feels "so much better" with ability stand at the sink to brush her teeth and wash her hands and face.    Extremity/Trunk Assessment Upper Extremity Assessment Upper Extremity Assessment: Right hand dominant;RUE deficits/detail;LUE deficits/detail;Generalized weakness RUE Deficits / Details: generalized weakness; shoulder AROM to approx 75 degrees and PROM/AAROM to approx 100 degrees; noted mild tremors during functional tasks; continued fine motor deficits but with noted improvments this day as compared to last skilled OT  session. RUE Coordination: decreased fine motor (mild) LUE Deficits / Details: generalized weakness; shoulder AROM to approx 45 degrees and PROM/AAROM to approx 90 degrees; noted mild tremors during functional tasks; continued fine motor deficits but with noted improvments this day and noted increase in overall spontanious funcitonal use of L UE as compared to last skilled OT session. Pt reports no pain in L UE this day. Pt with noted popping/clicking in L shoulder with AROM/AAROM with pt reporting this is baseline and reporting no pain. LUE Coordination: decreased fine motor (mild)   Lower Extremity Assessment Lower Extremity Assessment: Defer to PT evaluation        Vision       Perception     Praxis     Communication Communication Communication: No apparent difficulties   Cognition Arousal: Alert Behavior During Therapy: WFL for tasks assessed/performed Cognition: History of cognitive impairments, No family/caregiver present to determine baseline, Cognition impaired   Orientation impairments: Time, Situation (Pt stating she in in the hospital, unable to state why but knows she was not feeling well over the past few days. Not able to state month/year. Pt reporting she hopes her daughter comes to see her today.) Awareness: Intellectual awareness intact, Online awareness impaired Memory impairment (select all impairments): Short-term memory, Working [employer] fast streamer, Engineer, structural memory Attention impairment (select first level of impairment): Alternating attention Executive functioning impairment (select all impairments): Initiation, Organization, Sequencing, Reasoning, Problem solving OT - Cognition Comments: Pt with diagnosis of dementia at baseline. Pt with continued cognitive deficits as above, but with significantly improved alertness, ability to follow instrutions, and attention to tasks this session as compared to last skilled OT session. Pt also reporting she feels "much better"  today.  Following commands: Impaired Following commands impaired: Only follows one step commands consistently, Follows multi-step commands inconsistently, Follows multi-step commands with increased time (Pt did not require increased time to follow 1-step commands this day.)      Cueing   Cueing Techniques: Verbal cues, Gestural cues  Exercises Exercises: General Upper Extremity General Exercises - Upper Extremity Shoulder Flexion: AROM, AAROM, Left, 5 reps, Seated, Strengthening (increased activity tolerance) Shoulder Extension: AROM, AAROM, Left, 5 reps, Seated, Strengthening (increased activity tolerance) Shoulder Horizontal ABduction: AROM, AAROM, Left, Strengthening, 5 reps, Seated (increased activity tolerance) Shoulder Horizontal ADduction: AROM, AAROM, Left, 5 reps, Strengthening, Seated (increased activity tolerance)    Shoulder Instructions       General Comments Pt HR briefly up to 111 bpm during functional mobility with HR quickly recovering intpo the 90s with MD notified and otherwise with VSS on RA throughout session. Sitter present throughout session. MD present during a portion of session.    Pertinent Vitals/ Pain       Pain Assessment Pain Assessment: No/denies pain Pain Intervention(s): Monitored during session  Home Living                                          Prior Functioning/Environment              Frequency  Min 2X/week        Progress Toward Goals  OT Goals(current goals can now be found in the care plan section)  Progress towards OT goals: Progressing toward goals  Acute Rehab OT Goals Patient Stated Goal: to visit with her daughter  Plan      Co-evaluation                 AM-PAC OT "6 Clicks" Daily Activity     Outcome Measure   Help from another person eating meals?: A Little Help from another person taking care of personal grooming?: A Little Help from another person toileting,  which includes using toliet, bedpan, or urinal?: A Lot Help from another person bathing (including washing, rinsing, drying)?: A Lot Help from another person to put on and taking off regular upper body clothing?: A Little Help from another person to put on and taking off regular lower body clothing?: A Lot 6 Click Score: 15    End of Session Equipment Utilized During Treatment: Gait belt;Rolling walker (2 wheels)  OT Visit Diagnosis: Unsteadiness on feet (R26.81);Other abnormalities of gait and mobility (R26.89);Muscle weakness (generalized) (M62.81);Other (comment) (decreased activity tolerance)   Activity Tolerance Patient tolerated treatment well   Patient Left in bed;with call bell/phone within reach;with nursing/sitter in room;Other (comment) (with MD in room)   Nurse Communication Mobility status;Other (comment) (Pt with noted improvements in cognition and mobility since OT eval.)        Time: 2130-8657 OT Time Calculation (min): 27 min  Charges: OT General Charges $OT Visit: 1 Visit OT Treatments $Self Care/Home Management : 8-22 mins $Therapeutic Exercise: 8-22 mins  Magenta Schmiesing "Darral Ellis., OTR/L, MA Acute Rehab [phone]   Walt Gunner 12/20/2023, 11:46 AM

## 2023-12-20 NOTE — Progress Notes (Signed)
 Progress Note  Patient Name: Deanna [name] Date of Encounter: 12/20/2023  Primary Cardiologist: Lauro Portal, MD   Subjective   Patient seen and examined at her bedside. Hx of dementia - baseline not well known to me  Inpatient Medications    Scheduled Meds:  aspirin  EC  81 mg Oral QHS   busPIRone   15 mg Oral BID   cholecalciferol   5,000 Units Oral Weekly   clopidogrel   75 mg Oral QHS   divalproex   250 mg Oral QHS   enoxaparin  (LOVENOX ) injection  30 mg Subcutaneous Q24H   escitalopram   20 mg Oral Daily   ezetimibe   10 mg Oral Daily   feeding supplement  237 mL Oral BID BM   levothyroxine   150 mcg Oral Q0600   metoprolol  succinate  25 mg Oral Daily   pantoprazole   40 mg Oral Daily   potassium chloride   40 mEq Oral Q4H   QUEtiapine  25 mg Oral QHS   rosuvastatin   20 mg Oral QHS   Continuous Infusions:  PRN Meds: acetaminophen  **OR** acetaminophen , albuterol , ALPRAZolam , QUEtiapine   Vital Signs    Vitals:   12/19/23 1936 12/19/23 2019 12/20/23 0003 12/20/23 0625  BP: 116/63   (!) 151/67  Pulse:   89 81  Resp: (!) 27 20    Temp: 99.3 F (37.4 C)  97.7 F (36.5 C) 97.9 F (36.6 C)  TempSrc: Oral  Oral Oral  SpO2: 95%     Weight:    72.6 kg  Height:        Intake/Output Summary (Last 24 hours) at 12/20/2023 0909 Last data filed at 12/19/2023 2258 Gross per 24 hour  Intake 720 ml  Output 1100 ml  Net -380 ml   Filed Weights   12/18/23 0402 12/18/23 2345 12/20/23 0625  Weight: 73.9 kg 72.8 kg 72.6 kg    Telemetry     - Personally Reviewed  ECG     - Personally Reviewed  Physical Exam    General: Comfortable Head: Atraumatic, normal size  Eyes: PEERLA, EOMI  Neck: Supple, normal JVD Cardiac: Normal S1, S2; RRR; no murmurs, rubs, or gallops Lungs: Clear to auscultation bilaterally Abd: Soft, nontender, no hepatomegaly  Ext: warm, no edema Musculoskeletal: No deformities, BUE and BLE strength normal and equal Skin: Warm and dry, no rashes    Neuro: Alert and oriented to person, place, time, and situation, CNII-XII grossly intact, no focal deficits  Psych: Normal mood and affect   Labs    Chemistry Recent Labs  Lab 12/15/23 2148 12/18/23 0300 12/18/23 1018 12/19/23 0318 12/20/23 0225  NA 139 139  --  139 137  K 4.3 3.2*  --  3.6 3.1*  CL 108 97*  --  97* 95*  CO2 23 31  --  33* 31  GLUCOSE 122* 113*  --  111* 176*  BUN 15 16  --  21 22  CREATININE 1.25* 1.31*  --  1.50* 1.59*  CALCIUM  9.1 9.2  --  9.4 9.0  PROT 5.8*  --  5.6*  --   --   ALBUMIN  3.2*  --  3.0*  --   --   AST 48*  --  23  --   --   ALT 11  --  10  --   --   ALKPHOS 48  --  49  --   --   BILITOT 1.2  --  1.7*  --   --   GFRNONAA 43*  41*  --  35* 32*  ANIONGAP 8 11  --  9 11     Hematology Recent Labs  Lab 12/17/23 0235 12/19/23 0318 12/20/23 0225  WBC 5.8 7.4 5.6  RBC 3.87 4.40 4.52  HGB 11.2* 12.3 12.4  HCT 34.0* 37.5 38.3  MCV 87.9 85.2 84.7  MCH 28.9 28.0 27.4  MCHC 32.9 32.8 32.4  RDW 13.9 13.6 13.5  PLT 182 214 202    Cardiac EnzymesNo results for input(s): "TROPONINI" in the last 168 hours. No results for input(s): "TROPIPOC" in the last 168 hours.   BNP Recent Labs  Lab 12/16/23 0648  BNP 1,006.4*     DDimer No results for input(s): "DDIMER" in the last 168 hours.   Radiology    VAS US  CAROTID Result Date: 12/18/2023 Carotid Arterial Duplex Study Patient Name:  Deanna [name]  Date of Exam:   12/18/2023 Medical Rec #: [id number]        Accession #:    [id number] Date of Birth: 22-May-1941        Patient Gender: F Patient Age:   83 years Exam Location:  [facility] Procedure:      VAS US  CAROTID Referring Phys: Fraser Jackson XU --------------------------------------------------------------------------------  Indications:       Carotid stenosis-bilateral I65.23. Risk Factors:      Hypertension, hyperlipidemia. Other Factors:     12/13/2023 -                    ENDARTERECTOMY, CAROTID (Right)                    ANGIOPLASTY,  USING PATCH GRAFT (Right: Neck). Limitations        Today's exam was limited due to the body habitus of the                    patient, heavy calcification and the resulting shadowing, the                    post surgical status of the patient and the patient's                    respiratory variation. Comparison Study:  10/09/2023 - Right Carotid: Velocities in the right ICA are                    consistent with a 80-99% stenosis. Non-hemodynamically                    significant plaque <50% noted in the CCA. The ECA appears                    >50% stenosed. Increased velocities of the right ICA compared                    to prior exam.                     Left Carotid: Patent CCA to SCA bypass graft with no evidence                    of stenosis. Velocities in the left ICA are consistent with a                    40-59%  stenosis. Non-hemodynamically significant plaque <50% noted                    in the CCA. The ECA appears <50% stenosed.                     Vertebrals: Bilateral vertebral arteries demonstrate                    antegrade flow.                    Subclavians: Right subclavian artery was stenotic. Performing Technologist: Lerry Ransom RVT  Examination Guidelines: A complete evaluation includes B-mode imaging, spectral Doppler, color Doppler, and power Doppler as needed of all accessible portions of each vessel. Bilateral testing is considered an integral part of a complete examination. Limited examinations for reoccurring indications may be performed as noted.  Right Carotid Findings: +----------+--------+--------+--------+-----------------------+--------+           PSV cm/sEDV cm/sStenosisPlaque Description     Comments +----------+--------+--------+--------+-----------------------+--------+ CCA Prox  83      14                                              +----------+--------+--------+--------+-----------------------+--------+ CCA Distal90      12                                               +----------+--------+--------+--------+-----------------------+--------+ ICA Prox  218     50      40-59%  smooth and heterogenous         +----------+--------+--------+--------+-----------------------+--------+ ICA Mid   204     39      40-59%                                  +----------+--------+--------+--------+-----------------------+--------+ ICA Distal97      22                                              +----------+--------+--------+--------+-----------------------+--------+ ECA       53      8                                               +----------+--------+--------+--------+-----------------------+--------+ +----------+--------+-------+--------+-------------------+           PSV cm/sEDV cmsDescribeArm Pressure (mmHG) +----------+--------+-------+--------+-------------------+ [id number]                                         +----------+--------+-------+--------+-------------------+ +---------+--------+--+--------+-+---------+ VertebralPSV cm/s35EDV cm/s7Antegrade +---------+--------+--+--------+-+---------+  Left Carotid Findings: +----------+-------+-------+--------+---------------------------------+--------+           PSV    EDV    StenosisPlaque Description               Comments           cm/s  cm/s                                                     +----------+-------+-------+--------+---------------------------------+--------+ CCA Prox  58     13                                                       +----------+-------+-------+--------+---------------------------------+--------+ CCA Mid   49     21             calcific                                  +----------+-------+-------+--------+---------------------------------+--------+ CCA Distal77     25             irregular, heterogenous and                                               calcific                                   +----------+-------+-------+--------+---------------------------------+--------+ ICA Prox  56     30             irregular and heterogenous                +----------+-------+-------+--------+---------------------------------+--------+ ICA Mid   74     34                                                       +----------+-------+-------+--------+---------------------------------+--------+ ICA Distal38     24                                                       +----------+-------+-------+--------+---------------------------------+--------+ ECA       30     5                                                        +----------+-------+-------+--------+---------------------------------+--------+ +----------+--------+--------+--------+-------------------+           PSV cm/sEDV cm/sDescribeArm Pressure (mmHG) +----------+--------+--------+--------+-------------------+ [id number]                                          +----------+--------+--------+--------+-------------------+ +---------+--------+--+--------+--+---------+ VertebralPSV cm/s32EDV cm/s16Antegrade +---------+--------+--+--------+--+---------+   Summary: Right Carotid: Velocities in the right ICA are consistent with a 40-59%  stenosis. Left Carotid: Velocities in the left ICA are consistent with a 1-39% stenosis. Vertebrals: Bilateral vertebral arteries demonstrate antegrade flow. *See table(s) above for measurements and observations.     Preliminary    MR ANGIO HEAD WO CONTRAST Result Date: 12/18/2023 CLINICAL DATA:  Stroke, follow-up. EXAM: MRA HEAD WITHOUT CONTRAST TECHNIQUE: Angiographic images of the Circle of Willis were acquired using MRA technique without intravenous contrast. COMPARISON:  MR head 12/18/23. FINDINGS: Anterior circulation: The internal carotid arteries are within normal limits the high cervical segments through the ICA termini. Moderate focal stenosis is  present in the mid left A1 segment. The distal segment scratched at the distal portion of left A1 is normal size. The vessel is diminutive proximal to this. The M1 segments are normal bilaterally. The right A1 segment is. No definite anterior communicating artery is present. The MCA bifurcations are normal bilaterally. The ACA and MCA branch vessels are otherwise normal. No aneurysm is present. Posterior circulation: The left vertebral artery is dominant. The right vertebral artery is hypoplastic and essentially terminates at the PICA. Prominent AICA vessels are noted bilaterally. Basilar artery is normal. Superior cerebellar arteries are patent. Both posterior cerebral arteries originate from basilar tip. Mild narrowing is present in the mid left P2 segment. The PCA branch vessels are normal bilaterally. No aneurysm is present. Anatomic variants: None Other: None. IMPRESSION: 1. Moderate focal stenosis in the mid left A1 segment. 2. Mild narrowing in the mid left P2 segment. 3. Hypoplastic right vertebral artery that essentially terminates at the PICA. 4. No other significant proximal stenosis, aneurysm, or branch vessel occlusion within the Circle of Willis. Electronically Signed   By: Audree Leas M.D.   On: 12/18/2023 15:04   MR BRAIN WO CONTRAST Result Date: 12/18/2023 CLINICAL DATA:  Neuro deficit, acute, stroke suspected. Left facial droop. EXAM: MRI HEAD WITHOUT CONTRAST TECHNIQUE: Multiplanar, multiecho pulse sequences of the brain and surrounding structures were obtained without intravenous contrast. COMPARISON:  CT 12/11/2023 FINDINGS: Brain: Diffusion imaging shows a 9 mm acute infarction in the right frontal white matter at the junction of the anterior limb internal capsule and external capsule. No other acute infarction. Mild chronic small-vessel ischemic change affects the pons. No focal cerebellar insult. Cerebral hemispheres show atrophy with moderate chronic small-vessel ischemic changes  of the white matter. There is encephalomalacia and volume loss in both anterior frontal lobes which could be due to ischemic infarctions or old closed head injury. No sign of mass, recent hemorrhage, hydrocephalus or extra-axial collection. Vascular: Major vessels at the base of the brain show flow. Skull and upper cervical spine: Negative Sinuses/Orbits: Clear/normal Other: Tiny amount of mastoid fluid on the right, not likely significant. IMPRESSION: 1. 9 mm acute infarction in the right frontal white matter at the junction of the anterior limb internal capsule and external capsule. 2. Atrophy and moderate chronic small-vessel ischemic changes elsewhere throughout the brain as outlined above. 3. Encephalomalacia and volume loss in both anterior frontal lobes which could be due to old ischemic infarctions or old closed head injury. Electronically Signed   By: Bettylou Brunner M.D.   On: 12/18/2023 11:55    Cardiac Studies     Patient Profile     82 y.o. 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 Charlotte Cookey), stage 3A CKD, CAD s/p stent, hypothyroidism, anxiety, recent CVA during  this admission    Assessment & Plan     Acute onset of heart failure with reduced ejection fraction recent EF 40 to 45% was 50 to 55%, abnormal wall motion abnormality seen on echo done in 2022 - Clinical exam not impressive  volume overload - IV lasix  stopped yesterday in the setting of the elevated cr would not restart for now   Acute stroke - continue Aspirin  81 mg daily and Crestor . She is also being followed by neurology     Coronary artery disease status post stent/CABG - no anginal symptoms    4. Paroxysmal atrial fibrillation-unclear why not on anticoagulation at home - not on [facility] at this time. Which has been discussed in the past with  shared decision risk and benefit. This can be addressed in the outpatient setting    PAD status post femorofemoral bypass - continue antiplatelet and statins   Coronary artery occlusion status post carotid enterectomy with bovine patch on the right side  Chronic kidney disease stage III, Cr 1.59. worse than yesterday.   Hypothyroidism - per primary team      For questions or updates, please contact [facility] Please consult [email or web address] for contact info under Cardiology/STEMI.      Signed, Mory Herrman, DO  12/20/2023, 9:09 AM

## 2023-12-21 DIAGNOSIS — I639 Cerebral infarction, unspecified: Secondary | ICD-10-CM | POA: Diagnosis not present

## 2023-12-21 LAB — COMPREHENSIVE METABOLIC PANEL WITH GFR
ALT: 12 U/L (ref 0–44)
AST: 17 U/L (ref 15–41)
Albumin: 3.2 g/dL — ABNORMAL LOW (ref 3.5–5.0)
Alkaline Phosphatase: 51 U/L (ref 38–126)
Anion gap: 9 (ref 5–15)
BUN: 19 mg/dL (ref 8–23)
CO2: 31 mmol/L (ref 22–32)
Calcium: 9.1 mg/dL (ref 8.9–10.3)
Chloride: 98 mmol/L (ref 98–111)
Creatinine, Ser: 1.54 mg/dL — ABNORMAL HIGH (ref 0.44–1.00)
GFR, Estimated: 34 mL/min — ABNORMAL LOW (ref >60)
Glucose, Bld: 113 mg/dL — ABNORMAL HIGH (ref 70–99)
Potassium: 4.1 mmol/L (ref 3.5–5.1)
Sodium: 138 mmol/L (ref 135–145)
Total Bilirubin: 0.8 mg/dL (ref 0.0–1.2)
Total Protein: 5.7 g/dL — ABNORMAL LOW (ref 6.5–8.1)

## 2023-12-21 LAB — CBC WITH DIFFERENTIAL/PLATELET
Abs Immature Granulocytes: 0.01 10*3/uL (ref 0.00–0.07)
Basophils Absolute: 0.1 10*3/uL (ref 0.0–0.1)
Basophils Relative: 1 %
Eosinophils Absolute: 0.4 10*3/uL (ref 0.0–0.5)
Eosinophils Relative: 6 %
HCT: 37.4 % (ref 36.0–46.0)
Hemoglobin: 12 g/dL (ref 12.0–15.0)
Immature Granulocytes: 0 %
Lymphocytes Relative: 37 %
Lymphs Abs: 2.4 10*3/uL (ref 0.7–4.0)
MCH: 27.7 pg (ref 26.0–34.0)
MCHC: 32.1 g/dL (ref 30.0–36.0)
MCV: 86.4 fL (ref 80.0–100.0)
Monocytes Absolute: 0.6 10*3/uL (ref 0.1–1.0)
Monocytes Relative: 10 %
Neutro Abs: 3 10*3/uL (ref 1.7–7.7)
Neutrophils Relative %: 46 %
Platelets: 193 10*3/uL (ref 150–400)
RBC: 4.33 MIL/uL (ref 3.87–5.11)
RDW: 13.4 % (ref 11.5–15.5)
WBC: 6.4 10*3/uL (ref 4.0–10.5)
nRBC: 0 % (ref 0.0–0.2)

## 2023-12-21 LAB — MAGNESIUM: Magnesium: 1.6 mg/dL — ABNORMAL LOW (ref 1.7–2.4)

## 2023-12-21 LAB — PHOSPHORUS: Phosphorus: 2.8 mg/dL (ref 2.5–4.6)

## 2023-12-21 MED ORDER — MAGNESIUM SULFATE 2 GM/50ML IV SOLN
2.0000 g | Freq: Once | INTRAVENOUS | Status: AC
  Administered 2023-12-21: 2 g via INTRAVENOUS
  Filled 2023-12-21: qty 50

## 2023-12-21 MED ORDER — MAGNESIUM OXIDE -MG SUPPLEMENT 400 (240 MG) MG PO TABS
400.0000 mg | ORAL_TABLET | Freq: Every day | ORAL | Status: DC
Start: 2023-12-21 — End: 2023-12-25
  Administered 2023-12-21 – 2023-12-24 (×4): 400 mg via ORAL
  Filled 2023-12-21 (×4): qty 1

## 2023-12-21 NOTE — Plan of Care (Signed)
  Problem: Clinical Measurements: Goal: Ability to maintain clinical measurements within normal limits will improve Outcome: Progressing Goal: Diagnostic test results will improve Outcome: Progressing   Problem: Safety: Goal: Non-violent Restraint(s) Outcome: Progressing

## 2023-12-21 NOTE — Progress Notes (Signed)
 LATE ENTRY NOTE  Per review of Ashly Caldwell's documentation in flowsheet on 12/19/2023, the following intervention was completed but not pulled through into the daily note. Pulling through on their behalf.  Verdia Glad, PT, DPT Acute Rehabilitation Services Office [phone]   12/19/23 1300  PT Visit Information  Last PT Received On 12/19/23  Assistance Needed +2  History of Present Illness Deanna [name] is a 83 y.o. female admitted 12/15/23 for AMS. Pt presented with behavioral changes including worsening confusion and paranoia. There was concern raised for a slight left facial droop, neurology consulted, MRI pending. PMH of TBI, dementia, paroxysmal atrial fibrillation, PAD, recent right carotid artery endarterectomy and angioplasty on 12/13/2023, hypothyroidism, CKD, CAD s/p stent, anxiety and depression.  Subjective Data  Patient Stated Goal Unable to state  Precautions  Precautions Fall  Recall of Precautions/Restrictions Intact  Restrictions  Weight Bearing Restrictions Per Provider Order No  Pain Assessment  Pain Assessment Faces  Faces Pain Scale 0  Cognition  Arousal Lethargic  Behavior During Therapy Flat affect  PT - Cognitive impairments History of cognitive impairments;Orientation (Dementia, TBI)  Orientation impairments Place;Time;Situation (able to recall [place] once reoriented)  Following Commands  Following commands Impaired  Following commands impaired Only follows one step commands consistently;Follows one step commands with increased time  Cueing  Cueing Techniques Verbal cues;Gestural cues;Tactile cues  Communication  Communication No apparent difficulties  Bed Mobility  Overal bed mobility Needs Assistance  Bed Mobility Supine to Sit  Supine to sit Min assist  General bed mobility comments with cues for initiation, sequencing, and hand placement/technique  Transfers  Overall transfer level Needs assistance  Equipment used 2 person hand held  assist  Transfers Sit to/from Stand  Sit to Stand Min assist;+2 physical assistance  General transfer comment max directional verbal cues to power up and for hand placement, minA to power up and steady  Ambulation/Gait  Ambulation/Gait assistance Min assist;+2 safety/equipment  Gait Distance (Feet) 15 Feet (x1, 20x1)  Assistive device Rolling walker (2 wheels)  Gait Pattern/deviations Step-to pattern;Decreased stride length  General Gait Details pt with onset of fatigue and bilat knee instability requiring seated rest break, BP 155/118 (127), 171/92 (112) s/p 2nd bout, 155/87 (104) once reclined in chair  Balance  Overall balance assessment Needs assistance  Sitting-balance support Single extremity supported;Feet supported  Sitting balance-Leahy Scale Fair  Sitting balance - Comments Pt sat EOB initially with minA able to less to CGA.  Standing balance support Bilateral upper extremity supported;During functional activity  Standing balance-Leahy Scale Poor  Standing balance comment reliant on external support  General Comments  General comments (skin integrity, edema, etc.) BP up to 171/92 post ambulation, returned to 155/87 once reclined in chair  PT - End of Session  Equipment Utilized During Treatment Gait belt  Activity Tolerance Patient tolerated treatment well  Patient left with call bell/phone within reach;in chair;with nursing/sitter in room  Nurse Communication Mobility status   PT - Assessment/Plan  PT Visit Diagnosis Difficulty in walking, not elsewhere classified (R26.2);Unsteadiness on feet (R26.81);Other abnormalities of gait and mobility (R26.89)  PT Frequency (ACUTE ONLY) Min 2X/week  Follow Up Recommendations Skilled nursing-short term rehab (<3 hours/day)  Can patient physically be transported by private vehicle No  Patient can return home with the following A lot of help with walking and/or transfers;A lot of help with bathing/dressing/bathroom;Assistance with  cooking/housework;Supervision due to cognitive status;Direct supervision/assist for medications management;Direct supervision/assist for financial management;Assist for transportation;Help with stairs or ramp for  entrance  PT equipment BSC/3in1;Wheelchair (measurements PT)  AM-PAC PT "6 Clicks" Mobility Outcome Measure (Version 2)  Help needed turning from your back to your side while in a flat bed without using bedrails? 2  Help needed moving from lying on your back to sitting on the side of a flat bed without using bedrails? 2  Help needed moving to and from a bed to a chair (including a wheelchair)? 2  Help needed standing up from a chair using your arms (e.g., wheelchair or bedside chair)? 2  Help needed to walk in hospital room? 2  Help needed climbing 3-5 steps with a railing?  1  6 Click Score 11  Consider Recommendation of Discharge To: CIR/SNF/LTACH  Progressive Mobility  What is the highest level of mobility based on the progressive mobility assessment? Level 4 (Walks with assist in room) - Balance while marching in place and cannot step forward and back - Complete  Mobility Referral Yes  Activity Ambulated with assistance in hallway  PT Goal Progression  Progress towards PT goals Progressing toward goals  Acute Rehab PT Goals  PT Goal Formulation Patient unable to participate in goal setting  Time For Goal Achievement 12/31/23  Potential to Achieve Goals Good  PT Time Calculation  PT Start Time (ACUTE ONLY) 1100  PT Stop Time (ACUTE ONLY) 1125  PT Time Calculation (min) (ACUTE ONLY) 25 min  PT General Charges  $$ ACUTE PT VISIT 1 Visit  PT Treatments  $Gait Training 23-37 mins

## 2023-12-21 NOTE — Progress Notes (Signed)
 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 Charlotte Cookey), stage 3A CKD, CAD s/p stent, hypothyroidism, anxiety and depression who presented to the ED with behavioral changes and facial droop.  - Workup noted acute CVA - Also noted to have cardiomyopathy on echo, cards following -Hospitalization complicated by delirium  Discharge pending SNF.   Assessment & Plan:   Active Problems:   Hypoxia   Acute CVA (cerebrovascular accident) (HCC)  Acute CVA -Reported to have increased confusion and facial droop on admission -MRI with 9 mm acute infarction in the right frontal white matter at the junction of the anterior limb internal capsule and external capsule (atrophy and moderate chronic small vessel ischemic changes elsewhere throughout the brain - encephalomalacia and volume loss in both anterior frontal lobes) - MRA with moderate focal stenosis in the mid L A1 segment, mild narrowing in the mid L P2 segment, hypoplastic R vertebral artery that essentially terminates at the PICA HbA1c is 5.3, LDL 29 TTE shows EF 40 to 87%, regional wall abnormalities, grade 2 diastolic dysfunction, RVSF mildly reduced, no shunt. Per neurology, due to R ICA high grade stenosis vs R CEA procedure related -Continue home aspirin , Plavix .  DAPT per vascular surgery - Continue statin. - PT OT SLP eval completed, SNF recommended   Acute on chronic combined CHF  - In addition to neurological symptoms, noted to be hypoxic on arrival,, CTA concerning for pulmonary edema, BNP 1000 -Last ECHO in 2022 with LVEF 50-55%, low normal LV function, RWMA, indeterminate  diastolic parameters  - Repeat TTE on 5/11 with EF 40-45%, wall motion abnormality and diastolic dysfunction -Cards following, diuresis on hold  - appreciate further recommendations   Dementia with behavioral changes Hx of TBI Patient with worsening cognitive decline per reports -She has dementia and has been tried on memantine and donepezil (discontinued due to allergic reaction/intolerance).  Seen by PT/OT. SNF recommended.   Paroxysmal Antonisha Waskey fib - not on anticoagulation at home. -resumed home regimen of Toprol , aspirin , Plavix     PAD s/p fem-fem bypass CAD s/p stent, CABG  Carotid artery occlusion s/p R endarterectomy with bovine patch (5/7, Dr. Charlotte Cookey) -continue home aspirin  and plavix  -continue  home crestor  and zetia    CKD 3A -baseline Cr 1.2-1.4, on admission Cr 1.25 -creatinine fluctuating -avoid nephrotoxic medications as able -monitor UOP   Anxiety and depression -PDMP reviewed and appropriate - Home regimen of Lexapro , Xanax , BuSpar  and Depakote  continued  - currently on seroquel - discussed risk/benefits, goal to use short term with daughter - Polypharmacy is [name] concern, awake and alert this morning, monitor for sedation   Hypothyroidism TSH suppressed at 0.236.  Free T4 elevated at 1.53.  -Reduce dose of Synthroid  to 150 mcg daily and have patient follow-up as outpatient.  GERD -resume home PPI   5 mm LUL lung nodule -repeat CT in 6-12 months as outpatient  Doesn't currently have IV access at this time.  No meds requiring IV and working on discharge to SNF.    DVT prophylaxis: lovenox  Code Status: full Family Communication: none - discussed with daughter Disposition:   Status is: Inpatient Remains inpatient appropriate because: need for inpatient  care   Consultants:  Cardiology neurology  Procedures:  Carotid US  Summary:  Right Carotid: Velocities in the right ICA are consistent with Rosemae Mcquown 40-59%                 stenosis.   Left Carotid: Velocities  in the left ICA are consistent with Pearlee Arvizu 1-39%  stenosis.   Vertebrals: Bilateral vertebral arteries demonstrate antegrade flow.   Echo IMPRESSIONS     1. Left ventricular ejection fraction, by estimation, is 40 to 45%. The  left ventricle has mildly decreased function. The left ventricle  demonstrates regional wall motion abnormalities (see scoring  diagram/findings for description). Left ventricular  diastolic parameters are consistent with Grade II diastolic dysfunction  (pseudonormalization). Elevated left atrial pressure.   2. Right ventricular systolic function is mildly reduced. The right  ventricular size is normal. There is normal pulmonary artery systolic  pressure. The estimated right ventricular systolic pressure is 26.7 mmHg.   3. Left atrial size was mildly dilated.   4. The mitral valve is degenerative. Mild mitral valve regurgitation.  Moderate mitral annular calcification.   5. The aortic valve is tricuspid. Aortic valve regurgitation is not  visualized. Aortic valve sclerosis/calcification is present, without any  evidence of aortic stenosis.   6. The inferior vena cava is normal in size with <50% respiratory  variability, suggesting right atrial pressure of 8 mmHg.    Antimicrobials:  Anti-infectives (From admission, onward)    None       Subjective: No new complaints  Objective: Vitals:   12/21/23 0628 12/21/23 0700 12/21/23 0721 12/21/23 1116  BP: 127/60  (!) 113/55 (!) 119/59  Pulse: 74 72 73 70  Resp: 17  20 18   Temp:   (!) 97.1 F (36.2 C) 98.3 F (36.8 C)  TempSrc:   Axillary Oral  SpO2: 95%  94% 94%  Weight:      Height:        Intake/Output Summary (Last 24 hours) at 12/21/2023 1459 Last data filed at 12/21/2023 1236 Gross per 24 hour  Intake 717 ml  Output 750 ml  Net -33 ml   Filed Weights   12/18/23 2345 12/20/23 0625 12/21/23 0500  Weight: 72.8 kg 72.6 kg 72.8 kg    Examination:  General: No acute distress. Cardiovascular:  RRR Lungs: unlabored Abdomen: Soft, nontender, nondistended  Neurological: Alert and oriented 2 (didn't know month). Moves all extremities 4 . Cranial nerves II through XII grossly intact. Skin: Warm and dry. No rashes or lesions. Extremities: No clubbing or cyanosis. No edema.   Data Reviewed: I have personally reviewed following labs and imaging studies  CBC: Recent Labs  Lab 12/15/23 2148 12/17/23 0235 12/19/23 0318 12/20/23 0225 12/21/23 0258  WBC 7.7 5.8 7.4 5.6 6.4  NEUTROABS 5.5  --   --   --  3.0  HGB 11.5* 11.2* 12.3 12.4 12.0  HCT 37.4 34.0* 37.5 38.3 37.4  MCV 92.6 87.9 85.2 84.7 86.4  PLT 120* 182 214 202 193    Basic Metabolic Panel: Recent Labs  Lab 12/15/23 2148 12/18/23 0300 12/19/23 0318 12/20/23 0225 12/21/23 0258  NA 139 139 139 137 138  K 4.3 3.2* 3.6 3.1* 4.1  CL 108 97* 97* 95* 98  CO2 23 31 33* 31 31  GLUCOSE 122* 113* 111* 176* 113*  BUN 15 16 21 22 19   CREATININE 1.25* 1.31* 1.50* 1.59* 1.54*  CALCIUM  9.1 9.2 9.4 9.0 9.1  MG  --  1.4*  --   --  1.6*  PHOS  --   --   --   --  2.8    GFR: Estimated Creatinine Clearance: 26.3 mL/min (Rasheen Bells) (by C-G formula based on SCr of 1.54 mg/dL (H)).  Liver Function Tests: Recent Labs  Lab 12/15/23 2148 12/18/23 1018 12/21/23 0258  AST 48* 23 17  ALT 11 10 12   ALKPHOS 48 49 51  BILITOT 1.2 1.7* 0.8  PROT 5.8* 5.6* 5.7*  ALBUMIN  3.2* 3.0* 3.2*    CBG: Recent Labs  Lab 12/16/23 1552  GLUCAP 153*     No results found for this or any previous visit (from the past 240 hours).        Radiology Studies: No results found.       Scheduled Meds:  aspirin  EC  81 mg Oral QHS   busPIRone   15 mg Oral BID   cholecalciferol   5,000 Units Oral Weekly   clopidogrel   75 mg Oral QHS   divalproex   250 mg Oral QHS   docusate sodium   100 mg Oral BID   enoxaparin  (LOVENOX ) injection  30 mg Subcutaneous Q24H   escitalopram   20 mg Oral Daily   ezetimibe   10 mg Oral Daily   feeding supplement   237 mL Oral BID BM   levothyroxine   150 mcg Oral Q0600   magnesium  oxide  400 mg Oral Daily   metoprolol  succinate  25 mg Oral Daily   pantoprazole   40 mg Oral Daily   QUEtiapine  50 mg Oral QHS   rosuvastatin   20 mg Oral QHS   senna-docusate  2 tablet Oral QHS   Continuous Infusions:   LOS: 5 days    Time spent: over 30 min    Donnetta Gains, MD [facility]   To contact the attending provider between 7A-7P or the covering provider during after hours 7P-7A, please log into the web site [email or web address] and access using universal [place] password for that web site. If you do not have the password, please call the hospital operator.  12/21/2023, 2:59 PM

## 2023-12-21 NOTE — TOC Progression Note (Addendum)
 Transition of Care [facility]) - Progression Note    Patient Details  Name: Deanna [name] MRN: [id number] Date of Birth: April 14, 1941  [facility]) CM/SW Contact  Arron Big, [place] Phone Number: 12/21/2023, 10:32 AM  Clinical Narrative:   CSW spoke with [name] about bed choice for SNF. [name] has not chosen at this time but will pick one today and let CSW know.   3:47 PM CSW left VM for [name] to obtain bed choice.   TOC will continue to follow.    Expected Discharge Plan: Skilled Nursing Facility Barriers to Discharge: Continued Medical Work up, English as a second language teacher, Other (must enter comment) (awating be choice from family)  Expected Discharge Plan and Services In-house Referral: Clinical Social Work     Living arrangements for the past 2 months: Apartment                                       Social Determinants of Health (SDOH) Interventions SDOH Screenings   Food Insecurity: Food Insecurity Present (12/16/2023)  Housing: High Risk (12/16/2023)  Transportation Needs: No Transportation Needs (12/16/2023)  Utilities: Not At Risk (12/16/2023)  Depression (PHQ2-9): Low Risk  (09/12/2023)  Social Connections: Moderately Isolated (12/16/2023)  Tobacco Use: Medium Risk (12/16/2023)    Readmission Risk Interventions    08/18/2023    2:28 PM  Readmission Risk Prevention Plan  Post Dischage Appt Complete  Medication Screening Complete  Transportation Screening Complete

## 2023-12-21 NOTE — Care Management Important Message (Signed)
 Important Message  Patient Details  Name: Deanna [name] MRN: [id number] Date of Birth: 09-30-1940   Important Message Given:  Yes - Medicare IM     Janith Melnick 12/21/2023, 9:01 AM

## 2023-12-21 NOTE — Progress Notes (Signed)
 Physical Therapy Treatment Patient Details Name: Deanna [name] MRN: [id number] DOB: 1940-08-25 Today's Date: 12/21/2023   History of Present Illness Deanna [name] is a 83 y.o. female admitted 12/15/23 for AMS. Pt presented with behavioral changes including worsening confusion and paranoia. 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 PMH of TBI, dementia, paroxysmal atrial fibrillation, PAD, recent right carotid artery endarterectomy and angioplasty on 12/13/2023, hypothyroidism, CKD, CAD s/p stent, anxiety and depression.    PT Comments  Upon entry, pt reporting HA and RN notified and brought Tylenol . Pt ambulating to and from bathroom with RW and min assist. Has increased bilateral knee instability with fatigue. Requiring assist for posterior peri care and donning underwear. Pt performed serial sit to stands in chair for functional strengthening and BLE power. PT assisting pt in calling daughter, for which pt was very grateful. Patient will benefit from continued inpatient follow up therapy, <3 hours/day.     If plan is discharge home, recommend the following: Assistance with cooking/housework;Supervision due to cognitive status;Direct supervision/assist for medications management;Direct supervision/assist for financial management;Assist for transportation;Help with stairs or ramp for entrance;A little help with walking and/or transfers;A lot of help with bathing/dressing/bathroom   Can travel by private vehicle     No  Equipment Recommendations  BSC/3in1;Wheelchair (measurements PT)    Recommendations for Other Services       Precautions / Restrictions Precautions Precautions: Fall Recall of Precautions/Restrictions: Impaired Restrictions Weight Bearing Restrictions Per Provider Order: No     Mobility  Bed Mobility Overal bed mobility: Needs Assistance Bed Mobility: Supine to Sit     Supine  to sit: Min assist     General bed mobility comments: Cues for use of bed rail, minA to support trunk to come to upright position    Transfers Overall transfer level: Needs assistance Equipment used: Rolling walker (2 wheels) Transfers: Sit to/from Stand Sit to Stand: Contact guard assist, Min assist           General transfer comment: CGA from edge of bed and chair, minA from toilet with pt pulling up on grab bar    Ambulation/Gait Ambulation/Gait assistance: Min assist Gait Distance (Feet): 20 Feet Assistive device: Rolling walker (2 wheels) Gait Pattern/deviations: Step-to pattern, Decreased stride length, Knee flexed in stance - left, Knee flexed in stance - right, Shuffle Gait velocity: decreased Gait velocity interpretation: <1.31 ft/sec, indicative of household ambulator   General Gait Details: Pt with stooped posture, increased bilateral knee flexion with instability with fatigue. MinA for balance and intermittent assist for steering walker   Stairs             Wheelchair Mobility     Tilt Bed    Modified Rankin (Stroke Patients Only) Modified Rankin (Stroke Patients Only) Pre-Morbid Rankin Score: Slight disability Modified Rankin: Moderately severe disability     Balance Overall balance assessment: Needs assistance Sitting-balance support: Feet supported Sitting balance-Leahy Scale: Fair     Standing balance support: Single extremity supported, Bilateral upper extremity supported, During functional activity Standing balance-Leahy Scale: Poor Standing balance comment: reliant on external support of RW or counter at sink this session with CGA for safety                            Communication Communication Communication: No apparent difficulties  Cognition Arousal: Alert Behavior During Therapy: [facility] for tasks  assessed/performed   PT - Cognitive impairments: History of cognitive impairments (dementia, TBI)                          Following commands: Impaired      Cueing Cueing Techniques: Verbal cues, Gestural cues  Exercises Other Exercises Other Exercises: x5 serial sit to stands    General Comments        Pertinent Vitals/Pain Pain Assessment Pain Assessment: Faces Faces Pain Scale: Hurts little more Pain Location: headache Pain Descriptors / Indicators: Headache Pain Intervention(s): Monitored during session, Patient requesting pain meds-RN notified    Home Living                          Prior Function            PT Goals (current goals can now be found in the care plan section) Acute Rehab PT Goals Patient Stated Goal: Unable to state Potential to Achieve Goals: Good Progress towards PT goals: Progressing toward goals    Frequency    Min 2X/week      PT Plan      Co-evaluation              AM-PAC PT "6 Clicks" Mobility   Outcome Measure  Help needed turning from your back to your side while in a flat bed without using bedrails?: A Little Help needed moving from lying on your back to sitting on the side of a flat bed without using bedrails?: A Little Help needed moving to and from a bed to a chair (including a wheelchair)?: A Little Help needed standing up from a chair using your arms (e.g., wheelchair or bedside chair)?: A Little Help needed to walk in hospital room?: A Little Help needed climbing 3-5 steps with a railing? : Total 6 Click Score: 16    End of Session Equipment Utilized During Treatment: Gait belt Activity Tolerance: Patient tolerated treatment well Patient left: in chair;with call bell/phone within reach;with chair alarm set Nurse Communication: Mobility status PT Visit Diagnosis: Difficulty in walking, not elsewhere classified (R26.2);Unsteadiness on feet (R26.81);Other abnormalities of gait and mobility (R26.89)     Time: 1610-9604 PT Time Calculation (min) (ACUTE ONLY): 28 min  Charges:    $Therapeutic Activity: 23-37  mins PT General Charges $$ ACUTE PT VISIT: 1 Visit                     Verdia Glad, PT, DPT Acute Rehabilitation Services Office [phone]    Claria Crofts 12/21/2023, 3:14 PM

## 2023-12-21 NOTE — Progress Notes (Signed)
 IVT consult placed for new PIV placement. Assessed right arm for IV with ultrasound - no suitable vessels noted. Patient is tender and notably bruised from previous attempts. Restricted to right arm. Spoke with Dr. Ada Acres who is comfortable transitioning her medications to PO. Primary RN Bernell Brigham notified.   Aasir Daigler R Chery Giusto, RN

## 2023-12-22 DIAGNOSIS — I639 Cerebral infarction, unspecified: Secondary | ICD-10-CM | POA: Diagnosis not present

## 2023-12-22 NOTE — TOC Progression Note (Signed)
 Transition of Care [facility]) - Progression Note    Patient Details  Name: ATLANTIS BINI MRN: [id number] Date of Birth: 1940-09-19  [facility]) CM/SW Contact  Arron Big, [place] Phone Number: 12/22/2023, 3:27 PM  Clinical Narrative:   CSW started insurance auth for patient at this time Siegfried [employer] is pending - auth id [id number].  TOC will continue to follow.    Expected Discharge Plan: Skilled Nursing Facility Barriers to Discharge: Continued Medical Work up, English as a second language teacher  Expected Discharge Plan and Services In-house Referral: Clinical Social Work     Living arrangements for the past 2 months: Apartment                                       Social Determinants of Health (SDOH) Interventions SDOH Screenings   Food Insecurity: Food Insecurity Present (12/16/2023)  Housing: High Risk (12/16/2023)  Transportation Needs: No Transportation Needs (12/16/2023)  Utilities: Not At Risk (12/16/2023)  Depression (PHQ2-9): Low Risk  (09/12/2023)  Social Connections: Moderately Isolated (12/16/2023)  Tobacco Use: Medium Risk (12/16/2023)    Readmission Risk Interventions    08/18/2023    2:28 PM  Readmission Risk Prevention Plan  Post Dischage Appt Complete  Medication Screening Complete  Transportation Screening Complete

## 2023-12-22 NOTE — Plan of Care (Signed)
   Problem: Clinical Measurements: Goal: Ability to maintain clinical measurements within normal limits will improve Outcome: Progressing Goal: Will remain free from infection Outcome: Progressing

## 2023-12-22 NOTE — Telephone Encounter (Signed)
 Copied from CRM [id number]. Topic: Clinical - Medical Advice >> Dec 22, 2023  3:45 PM Danelle Dunning F wrote: Reason for CRM:   Patient daughter, Devota Fontan, called in stating she was attempting to drop off the patient's :  Chronic Condition Verification Form from [employer]  The patient 's daughter states that she need the form chronic heart failure and cardiovascular disorders that need to be verified in order for patient to continue receiving insurance coverage.   If documents are not signed and sent back into [employer] by the end of the month, the insurance coverage will be cancelled.   Patient's daughter will attempt to deliver form on Monday. If form has been received from [employer] please fill it out and send it back to [employer] number provided.   Contact Information:   Phone: [phone] Devota Fontan) [employer] Fax number : [phone]

## 2023-12-22 NOTE — Progress Notes (Signed)
 PROGRESS NOTE    Deanna [name]  ZDG:[id number] DOB: 07-06-1941 DOA: 12/15/2023 PCP: Verma Gobble, NP  Chief Complaint  Patient presents with   Altered Mental Status    Brief Narrative:   83/F w PMH of TBI, dementia, chronic combined systolic and diastolic HF, paroxysmal atrial fibrillation, PAD, 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 Charlotte Cookey), stage 3A CKD, CAD s/p stent, hypothyroidism, anxiety and depression who presented to the ED with behavioral changes and facial droop.  - Workup noted acute CVA - Also noted to have cardiomyopathy on echo, cards following -Hospitalization complicated by delirium  Discharge pending SNF.   Assessment & Plan:   Active Problems:   Hypoxia   Acute CVA (cerebrovascular accident) (HCC)  Acute CVA -Reported to have increased confusion and facial droop on admission -MRI with 9 mm acute infarction in the right frontal white matter at the junction of the anterior limb internal capsule and external capsule (atrophy and moderate chronic small vessel ischemic changes elsewhere throughout the brain - encephalomalacia and volume loss in both anterior frontal lobes) - MRA with moderate focal stenosis in the mid L A1 segment, mild narrowing in the mid L P2 segment, hypoplastic R vertebral artery that essentially terminates at the PICA HbA1c is 5.3, LDL 29 TTE shows EF 40 to 95%, regional wall abnormalities, grade 2 diastolic dysfunction, RVSF mildly reduced, no shunt. Per neurology, due to R ICA high grade stenosis vs R CEA procedure related -Continue home aspirin , Plavix .  DAPT per vascular surgery - Continue statin. - PT OT SLP eval completed, SNF recommended   Acute on chronic combined CHF  - In addition to neurological symptoms, noted to be hypoxic on arrival,, CTA concerning for pulmonary edema, BNP 1000 -Last ECHO in 2022 with LVEF 50-55%, low normal LV function, RWMA, indeterminate  diastolic parameters  - Repeat TTE on 5/11 with EF 40-45%, wall motion abnormality and diastolic dysfunction -Cards following, diuresis on hold  - appreciate further recommendations   Dementia with behavioral changes Hx of TBI Anxiety and depression Patient with worsening cognitive decline per reports -She has dementia and has been tried on memantine and donepezil (discontinued due to allergic reaction/intolerance).  - currently on seroquel - discussed risk/benefits, goal to use short term with daughter Seen by PT/OT. SNF recommended - Home regimen of Lexapro , Xanax , BuSpar  and Depakote  continued   Paroxysmal Zabdiel Dripps fib - not on anticoagulation at home. -resumed home regimen of Toprol , aspirin , Plavix     PAD s/p fem-fem bypass CAD s/p stent, CABG  Carotid artery occlusion s/p R endarterectomy with bovine patch (5/7, Dr. Charlotte Cookey) -continue home aspirin  and plavix  -continue  home crestor  and zetia    CKD 3A -baseline Cr 1.2-1.4, on admission Cr 1.25 -creatinine fluctuating -avoid nephrotoxic medications as able -monitor UOP      Hypothyroidism TSH suppressed at 0.236.  Free T4 elevated at 1.53.  -Reduce dose of Synthroid  to 150 mcg daily and have patient follow-up as outpatient.  GERD -resume home PPI   5 mm LUL lung nodule -repeat CT in 6-12 months as outpatient  Doesn't currently have IV access at this time.  No meds requiring IV and working on discharge to SNF.    DVT prophylaxis: lovenox  Code Status: full Family Communication: none - discussed with daughter 5/14 Disposition:   Status is: Inpatient Remains inpatient appropriate because: need for inpatient care   Consultants:  Cardiology neurology  Procedures:  Carotid US  Summary:  Right Carotid:  Velocities in the right ICA are consistent with Vesta Wheeland 40-59%                 stenosis.   Left Carotid: Velocities in the left ICA are consistent with Dondi Aime 1-39%  stenosis.   Vertebrals: Bilateral vertebral arteries  demonstrate antegrade flow.   Echo IMPRESSIONS     1. Left ventricular ejection fraction, by estimation, is 40 to 45%. The  left ventricle has mildly decreased function. The left ventricle  demonstrates regional wall motion abnormalities (see scoring  diagram/findings for description). Left ventricular  diastolic parameters are consistent with Grade II diastolic dysfunction  (pseudonormalization). Elevated left atrial pressure.   2. Right ventricular systolic function is mildly reduced. The right  ventricular size is normal. There is normal pulmonary artery systolic  pressure. The estimated right ventricular systolic pressure is 26.7 mmHg.   3. Left atrial size was mildly dilated.   4. The mitral valve is degenerative. Mild mitral valve regurgitation.  Moderate mitral annular calcification.   5. The aortic valve is tricuspid. Aortic valve regurgitation is not  visualized. Aortic valve sclerosis/calcification is present, without any  evidence of aortic stenosis.   6. The inferior vena cava is normal in size with <50% respiratory  variability, suggesting right atrial pressure of 8 mmHg.    Antimicrobials:  Anti-infectives (From admission, onward)    None       Subjective: No complaints  Objective: Vitals:   12/22/23 0416 12/22/23 0731 12/22/23 1109 12/22/23 1550  BP: (!) 159/71 (!) 154/61 122/62 (!) 126/104  Pulse: 80 75 66 67  Resp: 13 14 20 18   Temp: 97.6 F (36.4 C) 97.7 F (36.5 C)  97.6 F (36.4 C)  TempSrc: Oral Oral Oral Oral  SpO2: 98%     Weight: 73.6 kg     Height:        Intake/Output Summary (Last 24 hours) at 12/22/2023 1633 Last data filed at 12/22/2023 1313 Gross per 24 hour  Intake 1080 ml  Output 1100 ml  Net -20 ml   Filed Weights   12/20/23 0625 12/21/23 0500 12/22/23 0416  Weight: 72.6 kg 72.8 kg 73.6 kg    Examination:  General: No acute distress. Cardiovascular: RRR Lungs: unlabored Abdomen: Soft, nontender,  nondistended Neurological: pleasantly confused Extremities: No clubbing or cyanosis. No edema.   Data Reviewed: I have personally reviewed following labs and imaging studies  CBC: Recent Labs  Lab 12/15/23 2148 12/17/23 0235 12/19/23 0318 12/20/23 0225 12/21/23 0258  WBC 7.7 5.8 7.4 5.6 6.4  NEUTROABS 5.5  --   --   --  3.0  HGB 11.5* 11.2* 12.3 12.4 12.0  HCT 37.4 34.0* 37.5 38.3 37.4  MCV 92.6 87.9 85.2 84.7 86.4  PLT 120* 182 214 202 193    Basic Metabolic Panel: Recent Labs  Lab 12/15/23 2148 12/18/23 0300 12/19/23 0318 12/20/23 0225 12/21/23 0258  NA 139 139 139 137 138  K 4.3 3.2* 3.6 3.1* 4.1  CL 108 97* 97* 95* 98  CO2 23 31 33* 31 31  GLUCOSE 122* 113* 111* 176* 113*  BUN 15 16 21 22 19   CREATININE 1.25* 1.31* 1.50* 1.59* 1.54*  CALCIUM  9.1 9.2 9.4 9.0 9.1  MG  --  1.4*  --   --  1.6*  PHOS  --   --   --   --  2.8    GFR: Estimated Creatinine Clearance: 26.5 mL/min (Olinda Nola) (by C-G formula based on SCr of 1.54 mg/dL (  H)).  Liver Function Tests: Recent Labs  Lab 12/15/23 2148 12/18/23 1018 12/21/23 0258  AST 48* 23 17  ALT 11 10 12   ALKPHOS 48 49 51  BILITOT 1.2 1.7* 0.8  PROT 5.8* 5.6* 5.7*  ALBUMIN  3.2* 3.0* 3.2*    CBG: Recent Labs  Lab 12/16/23 1552  GLUCAP 153*     No results found for this or any previous visit (from the past 240 hours).        Radiology Studies: No results found.       Scheduled Meds:  aspirin  EC  81 mg Oral QHS   busPIRone   15 mg Oral BID   cholecalciferol   5,000 Units Oral Weekly   clopidogrel   75 mg Oral QHS   divalproex   250 mg Oral QHS   docusate sodium   100 mg Oral BID   enoxaparin  (LOVENOX ) injection  30 mg Subcutaneous Q24H   escitalopram   20 mg Oral Daily   ezetimibe   10 mg Oral Daily   feeding supplement  237 mL Oral BID BM   levothyroxine   150 mcg Oral Q0600   magnesium  oxide  400 mg Oral Daily   metoprolol  succinate  25 mg Oral Daily   pantoprazole   40 mg Oral Daily   QUEtiapine  50  mg Oral QHS   rosuvastatin   20 mg Oral QHS   senna-docusate  2 tablet Oral QHS   Continuous Infusions:   LOS: 6 days    Time spent: over 30 min    Donnetta Gains, MD [facility]   To contact the attending provider between 7A-7P or the covering provider during after hours 7P-7A, please log into the web site [email or web address] and access using universal [place] password for that web site. If you do not have the password, please call the hospital operator.  12/22/2023, 4:33 PM

## 2023-12-22 NOTE — Progress Notes (Signed)
 Mobility Specialist: Progress Note   12/22/23 1259  Mobility  Activity Ambulated with assistance in hallway  Level of Assistance Contact guard assist, steadying assist  Assistive Device Front wheel walker  Distance Ambulated (ft) 80 ft  Activity Response Tolerated well  Mobility Referral Yes  Mobility visit 1 Mobility  Mobility Specialist Start Time (ACUTE ONLY) 1131  Mobility Specialist Stop Time (ACUTE ONLY) 1204  Mobility Specialist Time Calculation (min) (ACUTE ONLY) 33 min    Pt was very pleasant and eager for mobility session - received in bed. MinA for bed mobility to assist with scooting EOB and trunk elevation. CG for STS and ambulation. Pt requested new linen and gown d/t bed being soiled. MS assisted with gown and bed linen change at EOS. Ambulated in the hallway but c/o feeling very weak, took 2x standing breaks while leaning onto the RW d/t fatigue. Legs were also shaking with further ambulation. Further ambulation deferred and returned pt to room. Pt left in chair with all needs met, call bell in reach. Chair alarm on.   Deloria Fetch Mobility Specialist Please contact via SecureChat or Rehab office at [phone]

## 2023-12-22 NOTE — Plan of Care (Signed)
   Problem: Health Behavior/Discharge Planning: Goal: Ability to manage health-related needs will improve Outcome: Progressing   Problem: Clinical Measurements: Goal: Ability to maintain clinical measurements within normal limits will improve Outcome: Progressing Goal: Diagnostic test results will improve Outcome: Progressing

## 2023-12-23 DIAGNOSIS — I639 Cerebral infarction, unspecified: Secondary | ICD-10-CM | POA: Diagnosis not present

## 2023-12-23 NOTE — Progress Notes (Signed)
 PROGRESS NOTE    Deanna [name]  ZOX:[id number] DOB: [birth year] DOA: 12/15/2023 PCP: Verma Gobble, NP  Chief Complaint  Patient presents with   Altered Mental Status    Brief Narrative:   83/F w PMH of TBI, dementia, chronic combined systolic and diastolic HF, paroxysmal atrial fibrillation, PAD, 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 Charlotte Cookey), stage 3A CKD, CAD s/p stent, hypothyroidism, anxiety and depression who presented to the ED with behavioral changes and facial droop.  - Workup noted acute CVA - Also noted to have cardiomyopathy on echo, cards following -Hospitalization complicated by delirium  Discharge pending SNF.   Assessment & Plan:   Active Problems:   Hypoxia   Acute CVA (cerebrovascular accident) (HCC)  Acute CVA -Reported to have increased confusion and facial droop on admission -MRI with 9 mm acute infarction in the right frontal white matter at the junction of the anterior limb internal capsule and external capsule (atrophy and moderate chronic small vessel ischemic changes elsewhere throughout the brain - encephalomalacia and volume loss in both anterior frontal lobes) - MRA with moderate focal stenosis in the mid L A1 segment, mild narrowing in the mid L P2 segment, hypoplastic R vertebral artery that essentially terminates at the PICA HbA1c is 5.3, LDL 29 TTE shows EF 40 to 81%, regional wall abnormalities, grade 2 diastolic dysfunction, RVSF mildly reduced, no shunt. Per neurology, due to R ICA high grade stenosis vs R CEA procedure related -Continue home aspirin , Plavix .  DAPT per vascular surgery - Continue statin. - PT OT SLP eval completed, SNF recommended   Acute on chronic combined CHF  - In addition to neurological symptoms, noted to be hypoxic on arrival,, CTA concerning for pulmonary edema, BNP 1000 -Last ECHO in 2022 with LVEF 50-55%, low normal LV function, RWMA, indeterminate  diastolic parameters  - Repeat TTE on 5/11 with EF 40-45%, wall motion abnormality and diastolic dysfunction -Cards following, diuresis on hold  - appreciate further recommendations   Dementia with behavioral changes Hx of TBI Anxiety and depression Patient with worsening cognitive decline per reports -She has dementia and has been tried on memantine and donepezil (discontinued due to allergic reaction/intolerance).  - currently on seroquel  - discussed risk/benefits, goal to use short term with daughter Seen by PT/OT. SNF recommended - Home regimen of Lexapro , Xanax , BuSpar  and Depakote  continued   Paroxysmal Omere Marti fib - not on anticoagulation at home. -resumed home regimen of Toprol , aspirin , Plavix     PAD s/p fem-fem bypass CAD s/p stent, CABG  Carotid artery occlusion s/p R endarterectomy with bovine patch (5/7, Dr. Charlotte Cookey) -continue home aspirin  and plavix  -continue  home crestor  and zetia    CKD 3A -baseline Cr 1.2-1.4, on admission Cr 1.25 -creatinine fluctuating -avoid nephrotoxic medications as able -monitor UOP   Hypothyroidism TSH suppressed at 0.236.  Free T4 elevated at 1.53.  -Reduce dose of Synthroid  to 150 mcg daily and have patient follow-up as outpatient.  GERD -resume home PPI   5 mm LUL lung nodule -repeat CT in 6-12 months as outpatient  Doesn't currently have IV access at this time.  No meds requiring IV and working on discharge to SNF.    DVT prophylaxis: lovenox  Code Status: full Family Communication: none - discussed with daughter 5/14 Disposition:   Status is: Inpatient Remains inpatient appropriate because: need for inpatient care   Consultants:  Cardiology neurology  Procedures:  Carotid US  Summary:  Right Carotid: Velocities in the  right ICA are consistent with Jadore Veals 40-59%                 stenosis.   Left Carotid: Velocities in the left ICA are consistent with Carol Theys 1-39%  stenosis.   Vertebrals: Bilateral vertebral arteries demonstrate  antegrade flow.   Echo IMPRESSIONS     1. Left ventricular ejection fraction, by estimation, is 40 to 45%. The  left ventricle has mildly decreased function. The left ventricle  demonstrates regional wall motion abnormalities (see scoring  diagram/findings for description). Left ventricular  diastolic parameters are consistent with Grade II diastolic dysfunction  (pseudonormalization). Elevated left atrial pressure.   2. Right ventricular systolic function is mildly reduced. The right  ventricular size is normal. There is normal pulmonary artery systolic  pressure. The estimated right ventricular systolic pressure is 26.7 mmHg.   3. Left atrial size was mildly dilated.   4. The mitral valve is degenerative. Mild mitral valve regurgitation.  Moderate mitral annular calcification.   5. The aortic valve is tricuspid. Aortic valve regurgitation is not  visualized. Aortic valve sclerosis/calcification is present, without any  evidence of aortic stenosis.   6. The inferior vena cava is normal in size with <50% respiratory  variability, suggesting right atrial pressure of 8 mmHg.    Antimicrobials:  Anti-infectives (From admission, onward)    None       Subjective: No complaints  Objective: Vitals:   12/23/23 0113 12/23/23 0401 12/23/23 0727 12/23/23 1211  BP: (!) 114/59 109/67 118/75 118/64  Pulse: 72 68 70 74  Resp: 18 18 15 15   Temp: 97.6 F (36.4 C) 97.7 F (36.5 C) 97.7 F (36.5 C) 97.8 F (36.6 C)  TempSrc: Oral Oral Oral Oral  SpO2: 93% 95% 96% 96%  Weight:  74.4 kg    Height:        Intake/Output Summary (Last 24 hours) at 12/23/2023 1343 Last data filed at 12/23/2023 1233 Gross per 24 hour  Intake 1080 ml  Output 1300 ml  Net -220 ml   Filed Weights   12/21/23 0500 12/22/23 0416 12/23/23 0401  Weight: 72.8 kg 73.6 kg 74.4 kg    Examination:  General: No acute distress. Cardiovascular: RRR Lungs: unlabored Neurological: Alert, pleasantly,  confusedMoves all extremities 4 with equal strength. Cranial nerves II through XII grossly intact. Extremities: No clubbing or cyanosis. No edema.   Data Reviewed: I have personally reviewed following labs and imaging studies  CBC: Recent Labs  Lab 12/17/23 0235 12/19/23 0318 12/20/23 0225 12/21/23 0258  WBC 5.8 7.4 5.6 6.4  NEUTROABS  --   --   --  3.0  HGB 11.2* 12.3 12.4 12.0  HCT 34.0* 37.5 38.3 37.4  MCV 87.9 85.2 84.7 86.4  PLT 182 214 202 193    Basic Metabolic Panel: Recent Labs  Lab 12/18/23 0300 12/19/23 0318 12/20/23 0225 12/21/23 0258  NA 139 139 137 138  K 3.2* 3.6 3.1* 4.1  CL 97* 97* 95* 98  CO2 31 33* 31 31  GLUCOSE 113* 111* 176* 113*  BUN 16 21 22 19   CREATININE 1.31* 1.50* 1.59* 1.54*  CALCIUM  9.2 9.4 9.0 9.1  MG 1.4*  --   --  1.6*  PHOS  --   --   --  2.8    GFR: Estimated Creatinine Clearance: 26.6 mL/min (Deeanne Deininger) (by C-G formula based on SCr of 1.54 mg/dL (H)).  Liver Function Tests: Recent Labs  Lab 12/18/23 1018 12/21/23 0258  AST 23  17  ALT 10 12  ALKPHOS 49 51  BILITOT 1.7* 0.8  PROT 5.6* 5.7*  ALBUMIN  3.0* 3.2*    CBG: Recent Labs  Lab 12/16/23 1552  GLUCAP 153*     No results found for this or any previous visit (from the past 240 hours).        Radiology Studies: No results found.       Scheduled Meds:  aspirin  EC  81 mg Oral QHS   busPIRone   15 mg Oral BID   cholecalciferol   5,000 Units Oral Weekly   clopidogrel   75 mg Oral QHS   divalproex   250 mg Oral QHS   docusate sodium   100 mg Oral BID   enoxaparin  (LOVENOX ) injection  30 mg Subcutaneous Q24H   escitalopram   20 mg Oral Daily   ezetimibe   10 mg Oral Daily   feeding supplement  237 mL Oral BID BM   levothyroxine   150 mcg Oral Q0600   magnesium  oxide  400 mg Oral Daily   metoprolol  succinate  25 mg Oral Daily   pantoprazole   40 mg Oral Daily   QUEtiapine   50 mg Oral QHS   rosuvastatin   20 mg Oral QHS   senna-docusate  2 tablet Oral QHS    Continuous Infusions:   LOS: 7 days    Time spent: over 30 min    Donnetta Gains, MD [facility]   To contact the attending provider between 7A-7P or the covering provider during after hours 7P-7A, please log into the web site [email or web address] and access using universal [place] password for that web site. If you do not have the password, please call the hospital operator.  12/23/2023, 1:43 PM

## 2023-12-23 NOTE — TOC Progression Note (Signed)
 Transition of Care [facility]) - Initial/Assessment Note    Patient Details  Name: Deanna [name] MRN: [id number] Date of Birth: Feb 17, 1941  Transition of [facility]) CM/SW Contact:    Maya Sparrow, LCSW Phone Number: 12/23/2023, 2:35 PM  Clinical Narrative:                 English as a second language teacher for SNF is still pending.   TOC will continue to follow.    Expected Discharge Plan: Skilled Nursing Facility Barriers to Discharge: Continued Medical Work up, English as a second language teacher   Patient Goals and CMS Choice Patient states their goals for this hospitalization and ongoing recovery are:: Unable to assess          Expected Discharge Plan and Services In-house Referral: Clinical Social Work     Living arrangements for the past 2 months: Apartment                                      Prior Living Arrangements/Services Living arrangements for the past 2 months: Apartment Lives with:: Self Patient language and need for interpreter reviewed:: Yes Do you feel safe going back to the place where you live?: Yes      Need for Family Participation in Patient Care: Yes (Comment)   Current home services: DME Criminal Activity/Legal Involvement Pertinent to Current Situation/Hospitalization: No - Comment as needed  Activities of Daily Living   ADL Screening (condition at time of admission) Independently performs ADLs?: No Does the patient have a NEW difficulty with bathing/dressing/toileting/self-feeding that is expected to last >3 days?: Yes (Initiates electronic notice to provider for possible OT consult) Does the patient have a NEW difficulty with getting in/out of bed, walking, or climbing stairs that is expected to last >3 days?: Yes (Initiates electronic notice to provider for possible PT consult) Does the patient have a NEW difficulty with communication that is expected to last >3 days?: No Is the patient deaf or have difficulty hearing?: Yes (hard of hearing  w/consonants) Does the patient have difficulty seeing, even when wearing glasses/contacts?: Yes (lost most vision in L eye) Does the patient have difficulty concentrating, remembering, or making decisions?: Yes  Permission Sought/Granted   Permission granted to share information with : No (Contact info for dtr on chart)              Emotional Assessment Appearance:: Appears stated age Attitude/Demeanor/Rapport: Unable to Assess Affect (typically observed): Unable to Assess Orientation: : Oriented to Self Alcohol / Substance Use: Not Applicable Psych Involvement: No (comment)  Admission diagnosis:  Lung nodule [R91.1] Hypoxia [R09.02] Cognitive and behavioral changes [R41.89, R46.89] Congestive heart failure, unspecified HF chronicity, unspecified heart failure type [facility]) [I50.9] Patient Active Problem List   Diagnosis Date Noted   Acute CVA (cerebrovascular accident) (HCC) 12/20/2023   Hypoxia 12/16/2023   Carotid artery stenosis, asymptomatic, right 12/13/2023   Left arm pain 08/17/2023   Delirium 08/17/2023   UTI (urinary tract infection) 08/16/2023   Anxiety 04/24/2023   Acute respiratory failure with hypoxia (HCC) 06/17/2022   COVID-19 virus infection 06/17/2022   Stage 3b chronic kidney disease (CKD) (HCC) - baseline SCr 1.8 06/17/2022   PAD (peripheral artery disease) (HCC) 10/11/2021   PAF (paroxysmal atrial fibrillation) (HCC) 10/11/2021   Hypokalemia 05/09/2021   Status post coronary artery stent placement    Overactive bladder 12/16/2019   Generalized anxiety disorder 06/17/2018   Mood disorder (HCC) 06/17/2018  Chronic diastolic CHF (congestive heart failure) (HCC) 11/16/2017   Primary open angle glaucoma of both eyes, moderate stage 11/09/2017   Risk for falls 11/22/2016   Hearing disorder 11/03/2016   Spondylosis of lumbar region without myelopathy or radiculopathy 02/25/2016   History of traumatic brain injury 11/04/2015   Primary osteoarthritis of both  hips 08/17/2015   Cognitive impairment 07/08/2015   OSA on CPAP 04/26/2015   Chronic pain 04/26/2015   Physical deconditioning 04/26/2015   Hyperlipidemia 07/02/2014   Carotid artery stenosis 03/26/2012   Shortness of breath 12/19/2011    Class: Acute   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, 2011 11/28/2011   History of colonic polyps 06/18/2010   Hypothyroidism 11/17/2008   Obesity, Class III, BMI 40-49.9 (morbid obesity) (HCC) 11/17/2008   ANXIETY DEPRESSION 11/17/2008   Essential hypertension 11/17/2008   HEMORRHOIDS 11/17/2008   GERD 11/17/2008   IBS 11/17/2008   Atherosclerosis of coronary artery bypass graft(s), unspecified, with other forms of angina pectoris (HCC)    PCP:  Verma Gobble, NP Pharmacy:   [facility] [phone] Jonette Nestle, [place] - [address] AT [facility] [address] [place] [address] Phone: [phone] Fax: [phone]  [facility] [address] [place] [address] Phone: [phone] Fax: [phone]     Social Drivers of Health (SDOH) Social History: SDOH Screenings   Food Insecurity: Food Insecurity Present (12/16/2023)  Housing: High Risk (12/16/2023)  Transportation Needs: No Transportation Needs (12/16/2023)  Utilities: Not At Risk (12/16/2023)  Depression (PHQ2-9): Low Risk  (09/12/2023)  Social Connections: Moderately Isolated (12/16/2023)  Tobacco Use: Medium Risk (12/16/2023)   SDOH Interventions: Food Insecurity Interventions: Inpatient TOC, Other (Comment) (Going to SNF) Housing Interventions: Inpatient TOC, Other (Comment) (Going to SNF)   Readmission Risk Interventions    08/18/2023    2:28 PM  Readmission Risk Prevention Plan  Post Dischage Appt Complete  Medication Screening Complete  Transportation Screening Complete

## 2023-12-24 DIAGNOSIS — E569 Vitamin deficiency, unspecified: Secondary | ICD-10-CM | POA: Diagnosis not present

## 2023-12-24 DIAGNOSIS — I480 Paroxysmal atrial fibrillation: Secondary | ICD-10-CM | POA: Diagnosis not present

## 2023-12-24 DIAGNOSIS — I5042 Chronic combined systolic (congestive) and diastolic (congestive) heart failure: Secondary | ICD-10-CM | POA: Diagnosis not present

## 2023-12-24 DIAGNOSIS — I2510 Atherosclerotic heart disease of native coronary artery without angina pectoris: Secondary | ICD-10-CM | POA: Diagnosis not present

## 2023-12-24 DIAGNOSIS — M6281 Muscle weakness (generalized): Secondary | ICD-10-CM | POA: Diagnosis not present

## 2023-12-24 DIAGNOSIS — S069XAA Unspecified intracranial injury with loss of consciousness status unknown, initial encounter: Secondary | ICD-10-CM | POA: Diagnosis not present

## 2023-12-24 DIAGNOSIS — J9691 Respiratory failure, unspecified with hypoxia: Secondary | ICD-10-CM | POA: Diagnosis not present

## 2023-12-24 DIAGNOSIS — J439 Emphysema, unspecified: Secondary | ICD-10-CM | POA: Diagnosis not present

## 2023-12-24 DIAGNOSIS — Z7409 Other reduced mobility: Secondary | ICD-10-CM | POA: Diagnosis not present

## 2023-12-24 DIAGNOSIS — I6521 Occlusion and stenosis of right carotid artery: Secondary | ICD-10-CM | POA: Diagnosis not present

## 2023-12-24 DIAGNOSIS — N1832 Chronic kidney disease, stage 3b: Secondary | ICD-10-CM | POA: Diagnosis not present

## 2023-12-24 DIAGNOSIS — I639 Cerebral infarction, unspecified: Secondary | ICD-10-CM | POA: Diagnosis not present

## 2023-12-24 DIAGNOSIS — E8809 Other disorders of plasma-protein metabolism, not elsewhere classified: Secondary | ICD-10-CM | POA: Diagnosis not present

## 2023-12-24 DIAGNOSIS — M25511 Pain in right shoulder: Secondary | ICD-10-CM | POA: Diagnosis not present

## 2023-12-24 DIAGNOSIS — Z7401 Bed confinement status: Secondary | ICD-10-CM | POA: Diagnosis not present

## 2023-12-24 DIAGNOSIS — K219 Gastro-esophageal reflux disease without esophagitis: Secondary | ICD-10-CM | POA: Diagnosis not present

## 2023-12-24 DIAGNOSIS — H401132 Primary open-angle glaucoma, bilateral, moderate stage: Secondary | ICD-10-CM | POA: Diagnosis not present

## 2023-12-24 DIAGNOSIS — R531 Weakness: Secondary | ICD-10-CM | POA: Diagnosis not present

## 2023-12-24 DIAGNOSIS — N189 Chronic kidney disease, unspecified: Secondary | ICD-10-CM | POA: Diagnosis not present

## 2023-12-24 DIAGNOSIS — J810 Acute pulmonary edema: Secondary | ICD-10-CM | POA: Diagnosis not present

## 2023-12-24 DIAGNOSIS — I10 Essential (primary) hypertension: Secondary | ICD-10-CM | POA: Diagnosis not present

## 2023-12-24 DIAGNOSIS — E782 Mixed hyperlipidemia: Secondary | ICD-10-CM | POA: Diagnosis not present

## 2023-12-24 DIAGNOSIS — I739 Peripheral vascular disease, unspecified: Secondary | ICD-10-CM | POA: Diagnosis not present

## 2023-12-24 DIAGNOSIS — E039 Hypothyroidism, unspecified: Secondary | ICD-10-CM | POA: Diagnosis not present

## 2023-12-24 DIAGNOSIS — D649 Anemia, unspecified: Secondary | ICD-10-CM | POA: Diagnosis not present

## 2023-12-24 DIAGNOSIS — S069XAS Unspecified intracranial injury with loss of consciousness status unknown, sequela: Secondary | ICD-10-CM | POA: Diagnosis not present

## 2023-12-24 DIAGNOSIS — R5383 Other fatigue: Secondary | ICD-10-CM | POA: Diagnosis not present

## 2023-12-24 DIAGNOSIS — R262 Difficulty in walking, not elsewhere classified: Secondary | ICD-10-CM | POA: Diagnosis not present

## 2023-12-24 DIAGNOSIS — E785 Hyperlipidemia, unspecified: Secondary | ICD-10-CM | POA: Diagnosis not present

## 2023-12-24 DIAGNOSIS — N3281 Overactive bladder: Secondary | ICD-10-CM | POA: Diagnosis not present

## 2023-12-24 DIAGNOSIS — M25512 Pain in left shoulder: Secondary | ICD-10-CM | POA: Diagnosis not present

## 2023-12-24 DIAGNOSIS — M19012 Primary osteoarthritis, left shoulder: Secondary | ICD-10-CM | POA: Diagnosis not present

## 2023-12-24 DIAGNOSIS — Z743 Need for continuous supervision: Secondary | ICD-10-CM | POA: Diagnosis not present

## 2023-12-24 DIAGNOSIS — I779 Disorder of arteries and arterioles, unspecified: Secondary | ICD-10-CM | POA: Diagnosis not present

## 2023-12-24 DIAGNOSIS — Z7901 Long term (current) use of anticoagulants: Secondary | ICD-10-CM | POA: Diagnosis not present

## 2023-12-24 DIAGNOSIS — Z9889 Other specified postprocedural states: Secondary | ICD-10-CM | POA: Diagnosis not present

## 2023-12-24 DIAGNOSIS — F03918 Unspecified dementia, unspecified severity, with other behavioral disturbance: Secondary | ICD-10-CM | POA: Diagnosis not present

## 2023-12-24 DIAGNOSIS — I6381 Other cerebral infarction due to occlusion or stenosis of small artery: Secondary | ICD-10-CM | POA: Diagnosis not present

## 2023-12-24 MED ORDER — LEVOTHYROXINE SODIUM 150 MCG PO TABS
150.0000 ug | ORAL_TABLET | Freq: Every day | ORAL | Status: DC
Start: 2023-12-25 — End: 2024-05-01

## 2023-12-24 MED ORDER — QUETIAPINE FUMARATE 25 MG PO TABS: 25.0000 mg | ORAL_TABLET | Freq: Every day | ORAL | Status: DC

## 2023-12-24 MED ORDER — ATORVASTATIN CALCIUM 40 MG PO TABS: 40.0000 mg | ORAL_TABLET | Freq: Every day | ORAL | Status: DC

## 2023-12-24 MED ORDER — ALPRAZOLAM 0.5 MG PO TABS: 0.5000 mg | ORAL_TABLET | Freq: Every day | ORAL | 0 refills | Status: DC | PRN

## 2023-12-24 NOTE — Progress Notes (Signed)
 Mobility Specialist: Progress Note   12/24/23 1526  Mobility  Activity Ambulated with assistance in hallway  Level of Assistance Contact guard assist, steadying assist  Assistive Device Front wheel walker  Distance Ambulated (ft) 60 ft  Activity Response Tolerated well  Mobility Referral Yes  Mobility visit 1 Mobility  Mobility Specialist Start Time (ACUTE ONLY) [id number]  Mobility Specialist Stop Time (ACUTE ONLY) 0950  Mobility Specialist Time Calculation (min) (ACUTE ONLY) 23 min    Pt very pleasant and agreeable to mobility session - received in bed. Bed soiled upon entry, MS assisted with gown and linen change at EOS. CG throughout. C/o feeling weak. Returned to room without fault. Left in chair with all needs met, call bell in reach.   Deloria Fetch Mobility Specialist Please contact via SecureChat or Rehab office at [phone]

## 2023-12-24 NOTE — TOC Transition Note (Signed)
 Transition of Care [facility]) - Discharge Note   Patient Details  Name: Deanna [name] MRN: [id number] Date of Birth: 14-Jul-1941  Transition of Care [facility]) CM/SW Contact:  Deanna Sparrow, LCSW Phone Number: 12/24/2023, 1:30 PM   Clinical Narrative:    Patient will DC to: [facility] Anticipated DC date: 12/24/2023 Family notified: Yes, daughter Transport by: Deanna [name]  Per MD patient ready for DC to SNF. RN to call report prior to discharge [phone] room 117a). RN, patient's family, and facility notified of DC. Discharge Summary sent to facility. Insurance authorization has been approved [employer] engineer ID# [id number]). Ambulance transport will be requested for patient.   CSW will sign off for now as social work intervention is no longer needed. Please consult us  again if new needs arise.    Final next level of care: Skilled Nursing Facility Barriers to Discharge: Barriers Resolved   Patient Goals and CMS Choice Patient states their goals for this hospitalization and ongoing recovery are:: Unable to assess          Discharge Placement              Patient chooses bed at: [facility] Patient to be transferred to facility by: [facility] Name of family member notified: Deanna [name], Deanna [name] (Daughter)  [phone] Patient and family notified of of transfer: 12/24/23  Discharge Plan and Services Additional resources added to the After Visit Summary for   In-house Referral: Clinical Social Work                                   Social Drivers of Health (SDOH) Interventions SDOH Screenings   Food Insecurity: Food Insecurity Present (12/16/2023)  Housing: High Risk (12/16/2023)  Transportation Needs: No Transportation Needs (12/16/2023)  Utilities: Not At Risk (12/16/2023)  Depression (PHQ2-9): Low Risk  (09/12/2023)  Social Connections: Moderately Isolated (12/16/2023)  Tobacco Use: Medium Risk (12/16/2023)     Readmission Risk Interventions    08/18/2023    2:28  PM  Readmission Risk Prevention Plan  Post Dischage Appt Complete  Medication Screening Complete  Transportation Screening Complete

## 2023-12-24 NOTE — Discharge Summary (Addendum)
 Physician Discharge Summary  Deanna [name] ZOX:[id number] DOB: 11-17-1940 DOA: 12/15/2023  PCP: Deanna Gobble, NP  Admit date: 12/15/2023 Discharge date: 12/24/2023  Time spent: 40 minutes  Recommendations for Outpatient Follow-up:  Follow outpatient CBC/CMP  Follow with neurology outpatient for stroke Follow with vascular surgery outpatient  Follow volume status outpatient, follow with cards outpatient  Transitioned to Lipitor due to renal function  Follow repeat thyroid  function tests after recent reduction in dose here Follow 5 mm lung nodule with repeat imaging as recommended Wean seroquel  as able  Discharge Diagnoses:  Active Problems:   Hypoxia   Acute CVA (cerebrovascular accident) [facility])   Discharge Condition: stable  Diet recommendation: heart healthy  Filed Weights   12/22/23 0416 12/23/23 0401 12/24/23 0642  Weight: 73.6 kg 74.4 kg 72.5 kg    History of present illness:   83/F w PMH of TBI, dementia, chronic combined systolic and diastolic HF, paroxysmal atrial fibrillation, PAD, 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 Charlotte Cookey), stage 3A CKD, CAD s/p stent, hypothyroidism, anxiety and depression who presented to the ED with behavioral changes and facial droop.   Workup noted acute CVA.  She's been seen by neurology, plan to continue DAPT and statin.  Follow with neurology outpatient.  Also noted to have cardiomyopathy on echo, seen by cardiology.  Hospitalization complicated by delirium.  Improved at the time of discharge.  See below for additional details   Hospital Course:  Assessment and Plan:   Acute CVA -Reported to have increased confusion and facial droop on admission -MRI with 9 mm acute infarction in the right frontal white matter at the junction of the anterior limb internal capsule and external capsule (atrophy and moderate chronic small vessel ischemic changes elsewhere throughout  the brain - encephalomalacia and volume loss in both anterior frontal lobes) - MRA with moderate focal stenosis in the mid L A1 segment, mild narrowing in the mid L P2 segment, hypoplastic R vertebral artery that essentially terminates at the PICA HbA1c is 5.3, LDL 29 TTE shows EF 40 to 81%, regional wall abnormalities, grade 2 diastolic dysfunction, RVSF mildly reduced, no shunt. Per neurology, due to R ICA high grade stenosis vs R CEA procedure related -Continue home aspirin , Plavix .  DAPT per vascular surgery - Continue statin. - PT OT SLP eval completed, SNF recommended   Acute on chronic combined CHF  - In addition to neurological symptoms, noted to be hypoxic on arrival,, CTA concerning for pulmonary edema, BNP 1000 -Last ECHO in 2022 with LVEF 50-55%, low normal LV function, RWMA, indeterminate diastolic parameters  - Repeat TTE on 5/11 with EF 40-45%, wall motion abnormality and diastolic dysfunction -Cards following, diuresis on hold - resume as needed at dishcarge - follow outpatient with cardiology - appreciate further recommendations   Dementia with behavioral changes Hx of TBI Anxiety and depression Patient with worsening cognitive decline per reports -She has dementia and has been tried on memantine and donepezil (discontinued due to allergic reaction/intolerance).  - currently on seroquel  - discussed risk/benefits, goal to use short term with daughter Seen by PT/OT. SNF recommended - Home regimen of Lexapro , Xanax , BuSpar  and Depakote  continued    Paroxysmal Lusero Nordlund fib - not on anticoagulation at home. -resumed home regimen of Toprol , aspirin , Plavix     PAD s/p fem-fem bypass CAD s/p stent, CABG  Carotid artery occlusion s/p R endarterectomy with bovine patch (5/7, Dr. Charlotte Cookey) -continue home aspirin  and plavix  -continue  home  zetia , will transition to lipitor due to renal function   CKD 3A -baseline Cr 1.2-1.4, on admission Cr 1.25 -creatinine fluctuating - follow  outpatient -avoid nephrotoxic medications as able -monitor UOP   Hypothyroidism TSH suppressed at 0.236.  Free T4 elevated at 1.53.  -Reduce dose of Synthroid  to 150 mcg daily and have patient follow-up as outpatient.   GERD -resume home PPI   5 mm LUL lung nodule -repeat CT in 6-12 months as outpatient    Procedures: Carotid US  Summary:  Right Carotid: Velocities in the right ICA are consistent with Zalika Tieszen 40-59%                 stenosis.   Left Carotid: Velocities in the left ICA are consistent with Manus Weedman 1-39%  stenosis.   Vertebrals: Bilateral vertebral arteries demonstrate antegrade flow.   Echo IMPRESSIONS     1. Left ventricular ejection fraction, by estimation, is 40 to 45%. The  left ventricle has mildly decreased function. The left ventricle  demonstrates regional wall motion abnormalities (see scoring  diagram/findings for description). Left ventricular  diastolic parameters are consistent with Grade II diastolic dysfunction  (pseudonormalization). Elevated left atrial pressure.   2. Right ventricular systolic function is mildly reduced. The right  ventricular size is normal. There is normal pulmonary artery systolic  pressure. The estimated right ventricular systolic pressure is 26.7 mmHg.   3. Left atrial size was mildly dilated.   4. The mitral valve is degenerative. Mild mitral valve regurgitation.  Moderate mitral annular calcification.   5. The aortic valve is tricuspid. Aortic valve regurgitation is not  visualized. Aortic valve sclerosis/calcification is present, without any  evidence of aortic stenosis.   6. The inferior vena cava is normal in size with <50% respiratory  variability, suggesting right atrial pressure of 8 mmHg.   Consultations: Neurology cards  Discharge Exam: Vitals:   12/24/23 0730 12/24/23 0732  BP:  119/63  Pulse: 70 72  Resp:  (!) 21  Temp:  97.6 F (36.4 C)  SpO2:  96%   No complaints Called daughter, no  answer  General: No acute distress. Lungs: unlabored Neurological: Alert . Moves all extremities 4. Cranial nerves II through XII grossly intact. Extremities: No clubbing or cyanosis. No edema.   Discharge Instructions   Discharge Instructions     Ambulatory referral to Neurology   Complete by: As directed    Follow up with Abraham Hoffmann at [facility] in 4-6 weeks.  Patient is Sara's patient. Thanks.   Call MD for:  difficulty breathing, headache or visual disturbances   Complete by: As directed    Call MD for:  extreme fatigue   Complete by: As directed    Call MD for:  hives   Complete by: As directed    Call MD for:  persistant dizziness or light-headedness   Complete by: As directed    Call MD for:  persistant nausea and vomiting   Complete by: As directed    Call MD for:  redness, tenderness, or signs of infection (pain, swelling, redness, odor or green/yellow discharge around incision site)   Complete by: As directed    Call MD for:  severe uncontrolled pain   Complete by: As directed    Call MD for:  temperature >100.4   Complete by: As directed    Diet - low sodium heart healthy   Complete by: As directed    Discharge instructions   Complete by: As directed    You  were seen for Rivers Hamrick stroke.  You were seen by neurology who recommended you to continue your aspirin , plavix , and statin (we've changed your statin due to your renal function - you'll start lipitor now and stop crestor ).  In addition to the stroke, you were treated for shortness of breath and low oxygen  levels.  You were diuresed (fluid removed).  Continue your lasix  as needed outpatient for weight gain or swelling.  We started you on seroquel  nightly due to your behavioral disturbances.  This should be Elroy Schembri temporary medicine if possible.  Wean this as an outpatient with your outpatient providers.  We reduced your synthroid  to 150 mcg daily.  You'll need repeat labs outpatient.  You have Reegan Bouffard pulmonary nodule that needs  outpatient follow up.  Return for new, recurrent, or worsening symptoms.  Please ask your PCP to request records from this hospitalization so they know what was done and what the next steps will be.   Increase activity slowly   Complete by: As directed       Allergies as of 12/24/2023       Reactions   Lidocaine  Swelling, Other (See Comments)   Use CORRECT dose for age/weight- red welts and tongue swelling result, if not   Cymbalta  [duloxetine  Hcl] Other (See Comments)   Increased confusion and memory concerns    Donepezil Other (See Comments)   Altered mood and Aggression    Mobic [meloxicam] Other (See Comments)   "Heart failure"- cannot take   Namenda [memantine] Other (See Comments)   Severe aggression   Nsaids Other (See Comments)   Cannot have due to heart issues   Other Other (See Comments)   No salt or anything that might cause fluid retention/swelling!!   Oxycontin  [oxycodone ] Other (See Comments)   "Toxic dementia" - daughter feels like she could take this, however(??)   Vicodin [hydrocodone-acetaminophen ] Other (See Comments)   Delirium, Confusion, and Toxic dementia        Medication List     STOP taking these medications    ciprofloxacin  500 MG tablet Commonly known as: Cipro    gabapentin  100 MG capsule Commonly known as: NEURONTIN    lidocaine  5 % Commonly known as: Lidoderm    rosuvastatin  40 MG tablet Commonly known as: CRESTOR    traMADol  50 MG tablet Commonly known as: ULTRAM        TAKE these medications    albuterol  108 (90 Base) MCG/ACT inhaler Commonly known as: VENTOLIN  HFA Inhale 2 puffs into the lungs every 6 (six) hours as needed for wheezing or shortness of breath.   ALPRAZolam  0.5 MG tablet Commonly known as: Xanax  Take 1 tablet (0.5 mg total) by mouth daily as needed for up to 2 doses for anxiety.   aspirin  EC 81 MG tablet Take 81 mg by mouth at bedtime.   atorvastatin  40 MG tablet Commonly known as: Lipitor Take 1  tablet (40 mg total) by mouth daily.   busPIRone  15 MG tablet Commonly known as: BUSPAR  Take 1 tablet (15 mg total) by mouth 2 (two) times daily.   clopidogrel  75 MG tablet Commonly known as: PLAVIX  TAKE 1 TABLET(75 MG) BY MOUTH DAILY What changed: See the new instructions.   divalproex  250 MG 24 hr tablet Commonly known as: DEPAKOTE  ER Take 1 tablet (250 mg total) by mouth at bedtime.   dorzolamide  2 % ophthalmic solution Commonly known as: TRUSOPT  Place 1 drop into both eyes in the morning and at bedtime.   escitalopram  20 MG tablet Commonly known  as: LEXAPRO  Take 1 tablet (20 mg total) by mouth daily.   ezetimibe  10 MG tablet Commonly known as: ZETIA  Take 1 tablet (10 mg total) by mouth daily.   furosemide  40 MG tablet Commonly known as: LASIX  Take 1 tablet (40 mg total) by mouth daily as needed for fluid or edema.   ketoconazole  2 % shampoo Commonly known as: NIZORAL  APPLY TOPICALLY 2 TIMES Cartez Mogle WEEK.   levothyroxine  150 MCG tablet Commonly known as: SYNTHROID  Take 1 tablet (150 mcg total) by mouth daily at 6 (six) AM. Start taking on: Dec 25, 2023 What changed:  medication strength See the new instructions.   meclizine  25 MG tablet Commonly known as: ANTIVERT  Take 1 tablet (25 mg total) by mouth as needed for dizziness. What changed: when to take this   metoprolol  succinate 25 MG 24 hr tablet Commonly known as: TOPROL -XL Take 1 tablet (25 mg total) by mouth daily.   nitroGLYCERIN  0.4 MG SL tablet Commonly known as: NITROSTAT  Place 1 tablet (0.4 mg total) under the tongue every 5 (five) minutes x 3 doses as needed for chest pain.   pantoprazole  40 MG tablet Commonly known as: PROTONIX  Take 1 tablet (40 mg total) by mouth daily.   QUEtiapine  25 MG tablet Commonly known as: SEROQUEL  Take 1 tablet (25 mg total) by mouth at bedtime. Would wean as tolerated   TYLENOL  500 MG tablet Generic drug: acetaminophen  Take 500-1,000 mg by mouth 2 (two) times  daily.   VITAMIN B-12 PO Take 1 tablet by mouth in the morning.   Vitamin D3 1.25 MG (50000 UT) Caps Take 1 capsule by mouth once Lander Eslick week.       Allergies  Allergen Reactions   Lidocaine  Swelling and Other (See Comments)    Use CORRECT dose for age/weight- red welts and tongue swelling result, if not   Cymbalta  [Duloxetine  Hcl] Other (See Comments)    Increased confusion and memory concerns    Donepezil Other (See Comments)    Altered mood and Aggression     Mobic [Meloxicam] Other (See Comments)    "Heart failure"- cannot take   Namenda [Memantine] Other (See Comments)    Severe aggression   Nsaids Other (See Comments)    Cannot have due to heart issues   Other Other (See Comments)    No salt or anything that might cause fluid retention/swelling!!   Oxycontin  [Oxycodone ] Other (See Comments)    "Toxic dementia" - daughter feels like she could take this, however(??)   Vicodin [Hydrocodone-Acetaminophen ] Other (See Comments)    Delirium, Confusion, and Toxic dementia    Contact information for follow-up providers     Rosi Converse, PA-C. Schedule an appointment as soon as possible for Fama Muenchow visit in 1 month(s).   Specialty: Neurology Contact information: [address] [place] [address] [place] [address] [phone]              Contact information for after-discharge care     Destination     [facility] Preferred SNF .   Service: Skilled Nursing Contact information: [address] [place]  [address] [phone]                      The results of significant diagnostics from this hospitalization (including imaging, microbiology, ancillary and laboratory) are listed below for reference.    Significant Diagnostic Studies: VAS US  CAROTID Result Date: 12/22/2023 Carotid Arterial Duplex Study Patient Name:  Delania R Zamarron  Date of Exam:   12/18/2023 Medical Rec #: [id number]        Accession #:    [id number] Date of Birth:  April 05, 1941        Patient Gender: F Patient Age:   32 years Exam Location:  [facility] Procedure:      VAS US  CAROTID Referring Phys: Fraser Jackson XU --------------------------------------------------------------------------------  Indications:       Carotid stenosis-bilateral I65.23. Risk Factors:      Hypertension, hyperlipidemia. Other Factors:     12/13/2023 -                    ENDARTERECTOMY, CAROTID (Right)                    ANGIOPLASTY, USING PATCH GRAFT (Right: Neck). Limitations        Today's exam was limited due to the body habitus of the                    patient, heavy calcification and the resulting shadowing, the                    post surgical status of the patient and the patient's                    respiratory variation. Comparison Study:  10/09/2023 - Right Carotid: Velocities in the right ICA are                    consistent with Markeita Alicia 80-99% stenosis. Non-hemodynamically                    significant plaque <50% noted in the CCA. The ECA appears                    >50% stenosed. Increased velocities of the right ICA compared                    to prior exam.                     Left Carotid: Patent CCA to SCA bypass graft with no evidence                    of stenosis. Velocities in the left ICA are consistent with Varick Keys                    40-59%                    stenosis. Non-hemodynamically significant plaque <50% noted                    in the CCA. The ECA appears <50% stenosed.                     Vertebrals: Bilateral vertebral arteries demonstrate                    antegrade flow.                    Subclavians: Right subclavian artery was stenotic. Performing Technologist: Lerry Ransom RVT  Examination Guidelines: Dailyn Reith complete evaluation includes B-mode imaging, spectral Doppler, color Doppler, and power Doppler as needed of all accessible portions of each vessel. Bilateral testing is considered an integral part of Kameren Baade complete examination. Limited examinations for reoccurring  indications may be performed as noted.  Right Carotid Findings: +----------+--------+--------+--------+-----------------------+--------+           PSV cm/sEDV cm/sStenosisPlaque Description     Comments +----------+--------+--------+--------+-----------------------+--------+ CCA Prox  83      14                                              +----------+--------+--------+--------+-----------------------+--------+ CCA Distal90      12                                              +----------+--------+--------+--------+-----------------------+--------+ ICA Prox  218     50      40-59%  smooth and heterogenous         +----------+--------+--------+--------+-----------------------+--------+ ICA Mid   204     39      40-59%                                  +----------+--------+--------+--------+-----------------------+--------+ ICA Distal97      22                                              +----------+--------+--------+--------+-----------------------+--------+ ECA       53      8                                               +----------+--------+--------+--------+-----------------------+--------+ +----------+--------+-------+--------+-------------------+           PSV cm/sEDV cmsDescribeArm Pressure (mmHG) +----------+--------+-------+--------+-------------------+ [id number]                                         +----------+--------+-------+--------+-------------------+ +---------+--------+--+--------+-+---------+ VertebralPSV cm/s35EDV cm/s7Antegrade +---------+--------+--+--------+-+---------+  Left Carotid Findings: +----------+-------+-------+--------+---------------------------------+--------+           PSV    EDV    StenosisPlaque Description               Comments           cm/s   cm/s                                                     +----------+-------+-------+--------+---------------------------------+--------+ CCA Prox   58     13                                                       +----------+-------+-------+--------+---------------------------------+--------+ CCA Mid   49     21             calcific                                  +----------+-------+-------+--------+---------------------------------+--------+  CCA Distal77     25             irregular, heterogenous and                                               calcific                                  +----------+-------+-------+--------+---------------------------------+--------+ ICA Prox  56     30             irregular and heterogenous                +----------+-------+-------+--------+---------------------------------+--------+ ICA Mid   74     34                                                       +----------+-------+-------+--------+---------------------------------+--------+ ICA Distal38     24                                                       +----------+-------+-------+--------+---------------------------------+--------+ ECA       30     5                                                        +----------+-------+-------+--------+---------------------------------+--------+ +----------+--------+--------+--------+-------------------+           PSV cm/sEDV cm/sDescribeArm Pressure (mmHG) +----------+--------+--------+--------+-------------------+ [id number]                                          +----------+--------+--------+--------+-------------------+ +---------+--------+--+--------+--+---------+ VertebralPSV cm/s32EDV cm/s16Antegrade +---------+--------+--+--------+--+---------+   Summary: Right Carotid: Velocities in the right ICA are consistent with Maryfer Tauzin 40-59%                stenosis. Left Carotid: Velocities in the left ICA are consistent with Tylena Prisk 1-39% stenosis. Vertebrals: Bilateral vertebral arteries demonstrate antegrade flow. *See table(s) above for measurements and  observations.  Electronically signed by Ardella Beaver MD on 12/22/2023 at 10:14:48 AM.    Final    MR ANGIO HEAD WO CONTRAST Result Date: 12/18/2023 CLINICAL DATA:  Stroke, follow-up. EXAM: MRA HEAD WITHOUT CONTRAST TECHNIQUE: Angiographic images of the Circle of Willis were acquired using MRA technique without intravenous contrast. COMPARISON:  MR head 12/18/23. FINDINGS: Anterior circulation: The internal carotid arteries are within normal limits the high cervical segments through the ICA termini. Moderate focal stenosis is present in the mid left A1 segment. The distal segment scratched at the distal portion of left A1 is normal size. The vessel is diminutive proximal to this. The M1 segments are normal bilaterally. The right A1 segment is. No definite anterior communicating artery is present. The MCA bifurcations are normal bilaterally. The ACA and MCA  branch vessels are otherwise normal. No aneurysm is present. Posterior circulation: The left vertebral artery is dominant. The right vertebral artery is hypoplastic and essentially terminates at the PICA. Prominent AICA vessels are noted bilaterally. Basilar artery is normal. Superior cerebellar arteries are patent. Both posterior cerebral arteries originate from basilar tip. Mild narrowing is present in the mid left P2 segment. The PCA branch vessels are normal bilaterally. No aneurysm is present. Anatomic variants: None Other: None. IMPRESSION: 1. Moderate focal stenosis in the mid left A1 segment. 2. Mild narrowing in the mid left P2 segment. 3. Hypoplastic right vertebral artery that essentially terminates at the PICA. 4. No other significant proximal stenosis, aneurysm, or branch vessel occlusion within the Circle of Willis. Electronically Signed   By: Audree Leas M.D.   On: 12/18/2023 15:04   MR BRAIN WO CONTRAST Result Date: 12/18/2023 CLINICAL DATA:  Neuro deficit, acute, stroke suspected. Left facial droop. EXAM: MRI HEAD WITHOUT CONTRAST  TECHNIQUE: Multiplanar, multiecho pulse sequences of the brain and surrounding structures were obtained without intravenous contrast. COMPARISON:  CT 12/11/2023 FINDINGS: Brain: Diffusion imaging shows Kenlei Safi 9 mm acute infarction in the right frontal white matter at the junction of the anterior limb internal capsule and external capsule. No other acute infarction. Mild chronic small-vessel ischemic change affects the pons. No focal cerebellar insult. Cerebral hemispheres show atrophy with moderate chronic small-vessel ischemic changes of the white matter. There is encephalomalacia and volume loss in both anterior frontal lobes which could be due to ischemic infarctions or old closed head injury. No sign of mass, recent hemorrhage, hydrocephalus or extra-axial collection. Vascular: Major vessels at the base of the brain show flow. Skull and upper cervical spine: Negative Sinuses/Orbits: Clear/normal Other: Tiny amount of mastoid fluid on the right, not likely significant. IMPRESSION: 1. 9 mm acute infarction in the right frontal white matter at the junction of the anterior limb internal capsule and external capsule. 2. Atrophy and moderate chronic small-vessel ischemic changes elsewhere throughout the brain as outlined above. 3. Encephalomalacia and volume loss in both anterior frontal lobes which could be due to old ischemic infarctions or old closed head injury. Electronically Signed   By: Bettylou Brunner M.D.   On: 12/18/2023 11:55   ECHOCARDIOGRAM COMPLETE Result Date: 12/17/2023    ECHOCARDIOGRAM REPORT   Patient Name:   MADYSUN THALL Date of Exam: 12/17/2023 Medical Rec #:  [id number]       Height:       62.0 in Accession #:    [id number]      Weight:       165.1 lb Date of Birth:  [birth year]       BSA:          1.762 m Patient Age:    83 years        BP:           121/58 mmHg Patient Gender: F               HR:           76 bpm. Exam Location:  Inpatient Procedure: 2D Echo, Cardiac Doppler, Color Doppler and  Intracardiac            Opacification Agent (Both Spectral and Color Flow Doppler were            utilized during procedure). Indications:    CHF-Acute Systolic I50.21  History:        Patient has prior history of Echocardiogram examinations, most  recent 05/09/2021. CHF, CAD and Previous Myocardial Infarction;                 Risk Factors:Hypertension, Dyslipidemia and Sleep Apnea.  Sonographer:    Denese Finn RCS Referring Phys: [id number] SAGAR H JINWALA IMPRESSIONS  1. Left ventricular ejection fraction, by estimation, is 40 to 45%. The left ventricle has mildly decreased function. The left ventricle demonstrates regional wall motion abnormalities (see scoring diagram/findings for description). Left ventricular diastolic parameters are consistent with Grade II diastolic dysfunction (pseudonormalization). Elevated left atrial pressure.  2. Right ventricular systolic function is mildly reduced. The right ventricular size is normal. There is normal pulmonary artery systolic pressure. The estimated right ventricular systolic pressure is 26.7 mmHg.  3. Left atrial size was mildly dilated.  4. The mitral valve is degenerative. Mild mitral valve regurgitation. Moderate mitral annular calcification.  5. The aortic valve is tricuspid. Aortic valve regurgitation is not visualized. Aortic valve sclerosis/calcification is present, without any evidence of aortic stenosis.  6. The inferior vena cava is normal in size with <50% respiratory variability, suggesting right atrial pressure of 8 mmHg. FINDINGS  Left Ventricle: Left ventricular ejection fraction, by estimation, is 40 to 45%. The left ventricle has mildly decreased function. The left ventricle demonstrates regional wall motion abnormalities. Definity  contrast agent was given IV to delineate the left ventricular endocardial borders. The left ventricular internal cavity size was normal in size. There is no left ventricular hypertrophy. Left ventricular  diastolic parameters are consistent with Grade II diastolic dysfunction (pseudonormalization). Elevated left atrial pressure.  LV Wall Scoring: The apical septal segment, apical inferior segment, and apex are akinetic. The mid anteroseptal segment, mid inferoseptal segment, and mid inferior segment are hypokinetic. The entire anterior wall, entire lateral wall, basal anteroseptal segment, basal inferior segment, and basal inferoseptal segment are normal. Right Ventricle: The right ventricular size is normal. No increase in right ventricular wall thickness. Right ventricular systolic function is mildly reduced. There is normal pulmonary artery systolic pressure. The tricuspid regurgitant velocity is 2.16 m/s, and with an assumed right atrial pressure of 8 mmHg, the estimated right ventricular systolic pressure is 26.7 mmHg. Left Atrium: Left atrial size was mildly dilated. Right Atrium: Right atrial size was normal in size. Pericardium: There is no evidence of pericardial effusion. Mitral Valve: The mitral valve is degenerative in appearance. Moderate mitral annular calcification. Mild mitral valve regurgitation. Tricuspid Valve: The tricuspid valve is normal in structure. Tricuspid valve regurgitation is trivial. Aortic Valve: The aortic valve is tricuspid. Aortic valve regurgitation is not visualized. Aortic valve sclerosis/calcification is present, without any evidence of aortic stenosis. Pulmonic Valve: The pulmonic valve was not well visualized. Pulmonic valve regurgitation is trivial. Aorta: The aortic root is normal in size and structure. Venous: The inferior vena cava is normal in size with less than 50% respiratory variability, suggesting right atrial pressure of 8 mmHg. IAS/Shunts: The atrial septum is grossly normal.  LEFT VENTRICLE PLAX 2D LVIDd:         4.20 cm   Diastology LVIDs:         3.10 cm   LV e' medial:    6.31 cm/s LV PW:         1.30 cm   LV E/e' medial:  17.1 LV IVS:        1.50 cm   LV e'  lateral:   6.85 cm/s LVOT diam:     1.70 cm   LV E/e' lateral: 15.8 LV SV:  39 LV SV Index:   22 LVOT Area:     2.27 cm  RIGHT VENTRICLE RV S prime:     6.53 cm/s TAPSE (M-mode): 1.2 cm LEFT ATRIUM             Index        RIGHT ATRIUM           Index LA diam:        4.10 cm 2.33 cm/m   RA Area:     11.60 cm LA Vol (A2C):   62.9 ml 35.69 ml/m  RA Volume:   23.20 ml  13.17 ml/m LA Vol (A4C):   59.5 ml 33.77 ml/m LA Biplane Vol: 63.7 ml 36.15 ml/m  AORTIC VALVE LVOT Vmax:   87.00 cm/s LVOT Vmean:  57.100 cm/s LVOT VTI:    0.172 m  AORTA Ao Root diam: 3.10 cm MITRAL VALVE                TRICUSPID VALVE MV Area (PHT): 5.66 cm     TR Peak grad:   18.7 mmHg MV Decel Time: 134 msec     TR Vmax:        216.00 cm/s MV E velocity: 108.00 cm/s MV Lamae Fosco velocity: 144.00 cm/s  SHUNTS MV E/Chi Garlow ratio:  0.75         Systemic VTI:  0.17 m                             Systemic Diam: 1.70 cm Carson Clara MD Electronically signed by Carson Clara MD Signature Date/Time: 12/17/2023/2:22:46 PM    Final    CT Angio Chest PE W and/or Wo Contrast Result Date: 12/16/2023 CLINICAL DATA:  Evaluate for pulmonary embolism. Mental status changes. EXAM: CT ANGIOGRAPHY CHEST WITH CONTRAST TECHNIQUE: Multidetector CT imaging of the chest was performed using the standard protocol during bolus administration of intravenous contrast. Multiplanar CT image reconstructions and MIPs were obtained to evaluate the vascular anatomy. RADIATION DOSE REDUCTION: This exam was performed according to the departmental dose-optimization program which includes automated exposure control, adjustment of the mA and/or kV according to patient size and/or use of iterative reconstruction technique. CONTRAST:  75mL OMNIPAQUE  IOHEXOL  350 MG/ML SOLN COMPARISON:  11/14/2017 FINDINGS: Cardiovascular: Satisfactory opacification of the pulmonary arteries to the segmental level. No evidence of pulmonary embolism. Normal heart size. No pericardial effusion.  Previous median sternotomy and CABG procedure. Aortic atherosclerosis. Vascular graft is identified within the soft tissues of the left neck Mediastinum/Nodes: Along the paramidline lower neck there is increased soft tissue edema, image 1/7. Given the recent history of right endarterectomy this is favored to represent postsurgical change. Trachea appears patent and midline. Normal appearance of the esophagus. No mediastinal or hilar adenopathy. Lungs/Pleura: Small right pleural effusion with overlying compressive type atelectasis. 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 Mild bilateral interlobular septal thickening with lower lobe ground-glass attenuation. No pneumothorax or consolidative changes. Emphysema with diffuse bronchial wall thickening. Nonspecific, nodule in the left upper lobe measures 5 mm, image 50/6. Upper Abdomen: Small amount of perihepatic ascites is noted overlying the anterior right lobe. Atherosclerotic calcifications identified involving the abdominal aorta. Musculoskeletal: Status post median sternotomy. 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 No acute or suspicious osseous findings. Review of the MIP images confirms the above findings. IMPRESSION: 1. No evidence for acute pulmonary embolus. 2. Small right  pleural effusion with overlying compressive type atelectasis. 3. Mild bilateral interlobular septal thickening with lower lobe ground-glass attenuation. Findings are favored to represent mild pulmonary edema. Correlate for any signs or symptoms of mild CHF. 4. Small amount of perihepatic ascites. 5. Nonspecific, nodule in the left upper lobe measures 5 mm. If the patient is at high risk for bronchogenic carcinoma, follow-up chest CT at 6-12 months is recommended. If the patient is at low risk for bronchogenic carcinoma, follow-up chest CT at 12 months is recommended. This recommendation follows  the consensus statement: Guidelines for Management of Small Pulmonary Nodules Detected on CT Scans: Denitra Donaghey Statement from the Fleischner Society as published in Radiology 2005;237:395-400. 6. Aortic Atherosclerosis (ICD10-I70.0) and Emphysema (ICD10-J43.9). Electronically Signed   By: Kimberley Penman M.D.   On: 12/16/2023 05:38   DG Chest Port 1 View Result Date: 12/15/2023 CLINICAL DATA:  Altered mental status. EXAM: PORTABLE CHEST 1 VIEW COMPARISON:  September 12, 2023 FINDINGS: Multiple sternal wires and vascular clips are present. The heart size and mediastinal contours are within normal limits. Low lung volumes are noted. Very mild atelectasis is seen within the bilateral lung bases. There is no evidence of acute infiltrate, pleural effusion or pneumothorax. Chronic and degenerative changes seen involving both shoulders. Multilevel degenerative changes are also seen throughout the thoracic spine. IMPRESSION: 1. Evidence of prior median sternotomy/CABG. 2. Low lung volumes with very mild bibasilar atelectasis. Electronically Signed   By: Virgle Grime M.D.   On: 12/15/2023 23:14   CT HEAD WO CONTRAST ( ) Result Date: 12/11/2023 CLINICAL DATA:  Mental status change, altered mental status. EXAM: CT HEAD WITHOUT CONTRAST TECHNIQUE: Contiguous axial images were obtained from the base of the skull through the vertex without intravenous contrast. RADIATION DOSE REDUCTION: This exam was performed according to the departmental dose-optimization program which includes automated exposure control, adjustment of the mA and/or kV according to patient size and/or use of iterative reconstruction technique. COMPARISON:  MRI head 11/16/2016.  CT head 08/16/2023. FINDINGS: Brain: No acute intracranial hemorrhage. No CT evidence of acute infarct. Nonspecific hypoattenuation in the periventricular and subcortical white matter favored to reflect chronic microvascular ischemic changes. Mild parenchymal volume loss. Similar foci of  encephalomalacia in the anterior inferior right frontal lobe and in the anterior aspect of both superior frontal gyri. No edema, mass effect, or midline shift. The basilar cisterns are patent. Ventricles: Prominence of the ventricles suggesting underlying parenchymal volume loss. Vascular: Atherosclerotic calcifications of the carotid siphons. No hyperdense vessel. Skull: No acute or aggressive finding. Orbits: Orbits are symmetric. Sinuses: The visualized paranasal sinuses are clear. Other: Mastoid air cells are clear. IMPRESSION: No CT evidence of acute intracranial abnormality. Moderate chronic microvascular ischemic changes and mild parenchymal volume loss. Similar foci of encephalomalacia in the bilateral frontal lobes. Electronically Signed   By: Denny Flack M.D.   On: 12/11/2023 14:07    Microbiology: No results found for this or any previous visit (from the past 240 hours).   Labs: Basic Metabolic Panel: Recent Labs  Lab 12/18/23 0300 12/19/23 0318 12/20/23 0225 12/21/23 0258  NA 139 139 137 138  K 3.2* 3.6 3.1* 4.1  CL 97* 97* 95* 98  CO2 31 33* 31 31  GLUCOSE 113* 111* 176* 113*  BUN 16 21 22 19   CREATININE 1.31* 1.50* 1.59* 1.54*  CALCIUM  9.2 9.4 9.0 9.1  MG 1.4*  --   --  1.6*  PHOS  --   --   --  2.8  Liver Function Tests: Recent Labs  Lab 12/18/23 1018 12/21/23 0258  AST 23 17  ALT 10 12  ALKPHOS 49 51  BILITOT 1.7* 0.8  PROT 5.6* 5.7*  ALBUMIN  3.0* 3.2*   No results for input(s): "LIPASE", "AMYLASE" in the last 168 hours. Recent Labs  Lab 12/18/23 1018  AMMONIA 39*   CBC: Recent Labs  Lab 12/19/23 0318 12/20/23 0225 12/21/23 0258  WBC 7.4 5.6 6.4  NEUTROABS  --   --  3.0  HGB 12.3 12.4 12.0  HCT 37.5 38.3 37.4  MCV 85.2 84.7 86.4  PLT 214 202 193   Cardiac Enzymes: No results for input(s): "CKTOTAL", "CKMB", "CKMBINDEX", "TROPONINI" in the last 168 hours. BNP: BNP (last 3 results) Recent Labs    12/16/23 0648  BNP 1,006.4*    ProBNP  (last 3 results) No results for input(s): "PROBNP" in the last 8760 hours.  CBG: No results for input(s): "GLUCAP" in the last 168 hours.     Signed:  Donnetta Gains MD.  [facility] 12/24/2023, 12:52 PM

## 2023-12-25 ENCOUNTER — Inpatient Hospital Stay: Admitting: Nurse Practitioner

## 2023-12-25 DIAGNOSIS — E039 Hypothyroidism, unspecified: Secondary | ICD-10-CM | POA: Diagnosis not present

## 2023-12-25 DIAGNOSIS — I639 Cerebral infarction, unspecified: Secondary | ICD-10-CM | POA: Diagnosis not present

## 2023-12-25 DIAGNOSIS — I2510 Atherosclerotic heart disease of native coronary artery without angina pectoris: Secondary | ICD-10-CM | POA: Diagnosis not present

## 2023-12-25 DIAGNOSIS — S069XAA Unspecified intracranial injury with loss of consciousness status unknown, initial encounter: Secondary | ICD-10-CM | POA: Diagnosis not present

## 2023-12-25 DIAGNOSIS — I480 Paroxysmal atrial fibrillation: Secondary | ICD-10-CM | POA: Diagnosis not present

## 2023-12-25 DIAGNOSIS — N189 Chronic kidney disease, unspecified: Secondary | ICD-10-CM | POA: Diagnosis not present

## 2023-12-25 DIAGNOSIS — I779 Disorder of arteries and arterioles, unspecified: Secondary | ICD-10-CM | POA: Diagnosis not present

## 2023-12-25 DIAGNOSIS — I5042 Chronic combined systolic (congestive) and diastolic (congestive) heart failure: Secondary | ICD-10-CM | POA: Diagnosis not present

## 2023-12-25 DIAGNOSIS — I739 Peripheral vascular disease, unspecified: Secondary | ICD-10-CM | POA: Diagnosis not present

## 2023-12-25 NOTE — Telephone Encounter (Signed)
 Awaiting Paperwork

## 2023-12-26 DIAGNOSIS — Z7409 Other reduced mobility: Secondary | ICD-10-CM | POA: Diagnosis not present

## 2023-12-26 DIAGNOSIS — Z7901 Long term (current) use of anticoagulants: Secondary | ICD-10-CM | POA: Diagnosis not present

## 2023-12-26 DIAGNOSIS — Z9889 Other specified postprocedural states: Secondary | ICD-10-CM | POA: Diagnosis not present

## 2023-12-26 DIAGNOSIS — R531 Weakness: Secondary | ICD-10-CM | POA: Diagnosis not present

## 2023-12-26 DIAGNOSIS — I2510 Atherosclerotic heart disease of native coronary artery without angina pectoris: Secondary | ICD-10-CM | POA: Diagnosis not present

## 2023-12-26 DIAGNOSIS — I779 Disorder of arteries and arterioles, unspecified: Secondary | ICD-10-CM | POA: Diagnosis not present

## 2023-12-26 NOTE — Progress Notes (Addendum)
 [facility] Neurology Division Clinic Note - Progress Note   Date: 12/29/23  Deanna [name] MRN: [id number] DOB: February 19, 1941    Stroke:  right frontal white matter infarct, secondary to right ICA high-grade stenosis versus right CEA procedure related  Patient presented to ED on 12/13/2023, having had CEA on 12/11/2023. Immediately she developed L facial droop so she underwent a battery of studies (see below) . She was readmitted 5/10currently at [facility].  MRI of the brain was remarkable for a 9 mm acute infarction in the right frontal white matter at the junction of the anterior limb internal capsule and external capsule.   2-3 months ago she had L eye blindness and saw Ophthalmology MRA showed moderate focal stenosis in the left A1, mild left P2 stenosis, hypoplastic right VA terminus at the PICA Carotid Dopplers were remarkable for R ICA 40 to 59% stenosis.    2D echo EF 40 to 45%. LDL was 29 A1c was 5.3 She received Lovenox  for VTE prophylaxis  She was on baby aspirin  81 mg daily and Plavix  75 mg daily prior to admission and  indefinitely, as recommended by vascular surgery.   She is receiving  secondary stroke prevention  Follow-up in 6 months  Dementia with behavioral disturbance Cognitive decline is noted, she is no longer taking his medications Latest MRI of the brain May 2025, personally reviewed remarkable for atrophy and moderate chronic small vessel ischemic changes, along with a 9 mm acute infarction in the right frontal white matter at the junction of the anterior limb internal capsule and external capsule and encephalomalacia  Still sundowning, but better controlled with  BuSpar , Depakote  and Seroquel , Lexapro  per PCP Recommend dc Xanax , increase Depakote  to bid  Follow up 6 months ,   History of Present Illness:  Deanna [name] is a 83 y.o. 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  Denies vertigo dizziness . 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 Denies headaches, dysarthria or dysphagia. No seizures. Denies any chest pain, or shortness of breath. Denies any fever or chills, or night sweats. She has recurrent UTIs with chronic incontinence.  No tobacco. No new meds or hormonal supplements.  Denies any recent long distance trips or recent surgeries. No sick contacts. No new stressors present in personal life. Patient is compliant with medications   Out-side paper records, electronic medical record, and images have been reviewed where available and summarized as:  Lab Results  Component Value Date   HGBA1C 5.3 12/18/2023   Lab Results  Component Value Date   VITAMINB12 309 08/17/2023   Lab Results  Component Value Date   TSH 0.236 (L) 12/15/2023   No results found for: "ESRSEDRATE", "POCTSEDRATE"  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   CKD (chronic kidney disease), stage IV (HCC)    Cognitive impairment    Colon polyps    Community acquired pneumonia  Depression    Diverticulosis    Dysuria    Encephalomalacia    GAD (generalized anxiety disorder)    GERD (gastroesophageal reflux disease)    Glaucoma    Hearing loss    Hemorrhoids    Hypertension    Hypothyroidism    Incontinence    Mixed hyperlipidemia    Myocardial infarction [facility])    NSTEMI (non-ST  elevated myocardial infarction) (HCC) 12/19/2011   OSA on CPAP    PAF (paroxysmal atrial fibrillation) (HCC)    Peripheral arterial disease (HCC)    Pneumonitis 04/26/2015   Pyelonephritis due to Escherichia coli 04/28/2015   Sepsis (HCC)    Spondylosis of lumbar region without myelopathy or radiculopathy    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HCC)    Urticaria    Vertigo, benign positional     Past Surgical History:  Procedure Laterality Date   ABDOMINAL AORTOGRAM W/LOWER EXTREMITY N/A 08/22/2017   Procedure: ABDOMINAL AORTOGRAM W/LOWER EXTREMITY;  Surgeon: Margherita Shell, MD;  [facility]: [facility] INVASIVE CV LAB;  Service: Cardiovascular;  Laterality: N/A;   BILATERAL UPPER EXTREMITY ANGIOGRAM N/A 09/11/2012   Procedure: BILATERAL UPPER EXTREMITY ANGIOGRAM;  Surgeon: Margherita Shell, MD;  Location: [facility] CATH LAB;  Service: Cardiovascular;  Laterality: N/A;   BIOPSY  03/25/2020   Procedure: BIOPSY;  Surgeon: Normie Becton., MD;  Location: [facility] ENDOSCOPY;  Service: Gastroenterology;;   carotid duplex doppler Bilateral 09/03/2012, 11/03/2011   Evidence of 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 patent left common carotid- subclavian artery bypass with turbulent flow noted at the anastomosis with velocities of 295 cm/s   CAROTID-SUBCLAVIAN BYPASS GRAFT  12/15/2011   Procedure: BYPASS GRAFT CAROTID-SUBCLAVIAN;  Surgeon: Margherita Shell, MD;  Location: [facility] OR;  Service: Vascular;  Laterality: Left;  Left Carotid subclavian bypass   CORONARY ANGIOPLASTY     CORONARY ARTERY BYPASS GRAFT     CORONARY ATHERECTOMY N/A 03/19/2020   Procedure: CORONARY ATHERECTOMY;  Surgeon: Lucendia Rusk, MD;  Location: [facility] INVASIVE CV LAB;  Service: Cardiovascular;  Laterality: N/A;   CORONARY STENT INTERVENTION N/A 03/19/2020   Procedure: CORONARY STENT INTERVENTION;  Surgeon:  Lucendia Rusk, MD;  Location: [facility] INVASIVE CV LAB;  Service: Cardiovascular;  Laterality: N/A;   DOPPLER ECHOCARDIOGRAPHY  05/27/2010, 09/17/2008   Mild Proximal septal thickening is noted. Left ventricular systolic functions is normal ejection fraction =>55%. the aortic valve appears to be mildly sclerotic    ENDARTERECTOMY Right 12/13/2023   Procedure: ENDARTERECTOMY, CAROTID;  Surgeon: Margherita Shell, MD;  [facility] OR;  Service: Vascular;  Laterality: Right;   ESOPHAGOGASTRODUODENOSCOPY (EGD) WITH PROPOFOL  N/A 03/25/2020   Procedure: ESOPHAGOGASTRODUODENOSCOPY (EGD) WITH PROPOFOL ;  Surgeon: Normie Becton., MD;  Location: [facility] ENDOSCOPY;  Service: Gastroenterology;  Laterality: N/A;   fem-fem bypass graft  1999   holter monitor  01/21/2008   The predominant rhythm was normal sinus rhythm. Minimum heartrate of 63 bpm at +01:00, maximum heartrate of 105 bpm at + 10:35; and the average heartrate of 75 bpm. Ventricular ectopic activity totaled 1270: Multifocal; 866-PVC's and 404-VEs              JOINT REPLACEMENT     Left knee   LEFT HEART CATH AND CORS/GRAFTS ANGIOGRAPHY N/A 03/19/2020   Procedure: LEFT HEART CATH AND CORS/GRAFTS ANGIOGRAPHY;  Surgeon: Lucendia Rusk, MD;  Location: [facility] INVASIVE CV LAB;  Service: Cardiovascular;  Laterality: N/A;   LEFT HEART CATHETERIZATION WITH CORONARY/GRAFT ANGIOGRAM N/A 12/21/2011   Procedure: LEFT HEART CATHETERIZATION WITH Estella Helling;  Surgeon: Arleen Lacer, MD;  Location: [facility] CATH LAB;  Service: Cardiovascular;  Laterality: N/A;   NM MYOCAR PERF EJECTION FRACTION  09/22/2009, 07/03/2007   the post stress myocardial perfusion images show a normal pattern of perfusion is all regions. The post-stress ejection fraction is 68 %. no significant wall motion abnormalities noted. This is a low risk scan.   PATCH ANGIOPLASTY Right 12/13/2023   Procedure: ANGIOPLASTY, USING PATCH GRAFT;  Surgeon: Margherita Shell, MD;  Location: [facility] OR;   Service: Vascular;  Laterality: Right;   PERIPHERAL VASCULAR INTERVENTION  08/22/2017   Procedure: PERIPHERAL VASCULAR INTERVENTION;  Surgeon: Margherita Shell, MD;  Location: [facility] INVASIVE CV LAB;  Service: Cardiovascular;;  Fem/Fem Graft   REPLACEMENT TOTAL KNEE  05-2011   UNILATERAL UPPER EXTREMEITY ANGIOGRAM Left 11/15/2011   Procedure: UNILATERAL UPPER Riki Chatters;  Surgeon: Avanell Leigh, MD;  Location: [facility] CATH LAB;  Service: Cardiovascular;  Laterality: Left;     Medications:  Outpatient Encounter Medications as of 12/29/2023  Medication Sig Note   albuterol  (VENTOLIN  HFA) 108 (90 Base) MCG/ACT inhaler Inhale 2 puffs into the lungs every 6 (six) hours as needed for wheezing or shortness of breath.    ALPRAZolam  (XANAX ) 0.5 MG tablet Take 1 tablet (0.5 mg total) by mouth daily as needed for up to 2 doses for anxiety.    aspirin  EC 81 MG tablet Take 81 mg by mouth at bedtime. 12/16/2023: Was stopped a week ago for surgery   atorvastatin  (LIPITOR) 40 MG tablet Take 1 tablet (40 mg total) by mouth daily.    busPIRone  (BUSPAR ) 15 MG tablet Take 1 tablet (15 mg total) by mouth 2 (two) times daily.    Cholecalciferol  (VITAMIN D3) 1.25 MG (50000 UT) CAPS Take 1 capsule by mouth once a week. 12/16/2023: Was stopped a week ago for surgery    clopidogrel  (PLAVIX ) 75 MG tablet TAKE 1 TABLET(75 MG) BY MOUTH DAILY (Patient taking differently: Take 75 mg by mouth at bedtime.) 12/16/2023: Was stopped a week ago for surgery    Cyanocobalamin  (VITAMIN B-12 PO) Take 1 tablet by mouth in the morning. 12/16/2023: Was stopped a week ago for surgery    divalproex  (DEPAKOTE  ER) 250 MG 24 hr tablet Take 1 tablet (250 mg total) by mouth at bedtime. 12/16/2023: Ran out   dorzolamide  (TRUSOPT ) 2 % ophthalmic solution Place 1 drop into both eyes in the morning and at bedtime.    escitalopram  (LEXAPRO ) 20 MG tablet Take 1 tablet (20 mg total) by mouth daily. 12/16/2023: Ran out   ezetimibe  (ZETIA ) 10 MG tablet  Take 1 tablet (10 mg total) by mouth daily.    furosemide  (LASIX ) 40 MG tablet Take 1 tablet (40 mg total) by mouth daily as needed for fluid or edema.    ketoconazole  (NIZORAL ) 2 % shampoo APPLY TOPICALLY 2 TIMES A WEEK.    levothyroxine  (SYNTHROID ) 150 MCG tablet Take 1 tablet (150 mcg total) by mouth daily at 6 (six) AM.    meclizine  (ANTIVERT ) 25 MG tablet Take 1 tablet (25 mg total) by mouth as needed for dizziness. (Patient taking differently: Take 25 mg by mouth 3 (three) times daily as needed for dizziness.)    metoprolol  succinate (TOPROL -XL) 25 MG 24 hr tablet Take 1 tablet (25 mg total) by mouth daily.    nitroGLYCERIN  (NITROSTAT ) 0.4 MG SL  tablet Place 1 tablet (0.4 mg total) under the tongue every 5 (five) minutes x 3 doses as needed for chest pain.    pantoprazole  (PROTONIX ) 40 MG tablet Take 1 tablet (40 mg total) by mouth daily.    QUEtiapine  (SEROQUEL ) 25 MG tablet Take 1 tablet (25 mg total) by mouth at bedtime. Would wean as tolerated    TYLENOL  500 MG tablet Take 500-1,000 mg by mouth 2 (two) times daily.    No facility-administered encounter medications on file as of 12/29/2023.    Allergies:  Allergies  Allergen Reactions   Lidocaine  Swelling and Other (See Comments)    Use CORRECT dose for age/weight- red welts and tongue swelling result, if not   Cymbalta  [Duloxetine  Hcl] Other (See Comments)    Increased confusion and memory concerns    Donepezil Other (See Comments)    Altered mood and Aggression     Mobic [Meloxicam] Other (See Comments)    "Heart failure"- cannot take   Namenda [Memantine] Other (See Comments)    Severe aggression   Nsaids Other (See Comments)    Cannot have due to heart issues   Other Other (See Comments)    No salt or anything that might cause fluid retention/swelling!!   Oxycontin  [Oxycodone ] Other (See Comments)    "Toxic dementia" - daughter feels like she could take this, however(??)   Vicodin Aldo.Anis ] Other  (See Comments)    Delirium, Confusion, and Toxic dementia    Family History: Family History  Problem Relation Age of Onset   Heart attack Mother    Heart disease Mother        before age 26   Diabetes Father    Heart disease Father    Hypertension Father    Hyperlipidemia Father    Heart attack Father 32   Heart attack Brother 74   Cerebral palsy Sister 44   Congestive Heart Failure Sister 26   Heart attack Sister 85   Hypertension Sister 69   Dementia Sister    Anxiety disorder Sister    Anxiety disorder Sister 71   Heart Problems Sister    Stroke Sister    Heart Problems Sister 53   Heart attack Sister 54   Colon cancer Brother 78   Prostate cancer Brother 78   Hypertension Daughter    Irritable bowel syndrome Daughter    Depression Daughter    Anxiety disorder Daughter     Social History: Social History   Tobacco Use   Smoking status: Former    Current packs/day: 0.00    Types: Cigarettes    Start date: 11/28/1978    Quit date: 11/27/1981    Years since quitting: 42.1   Smokeless tobacco: Never  Vaping Use   Vaping status: Never Used  Substance Use Topics   Alcohol use: Not Currently    Alcohol/week: 0.0 standard drinks of alcohol   Drug use: No       Vital Signs:  BP (!) 120/55   Pulse 61   Resp 20   Ht 5\' 7"  (1.702 m)   SpO2 96%   BMI 25.67 kg/m    General Medical Exam:   General:  Well appearing, comfortable.   Eyes/ENT: see cranial nerve examination.   Neck:   R 2+  harsh carotid bruits s/p CEA, no bruit on R  Respiratory:  Clear to auscultation, good air entry bilaterally.   Cardiac:  Regular rate and rhythm, no murmur.   Extremities:  No deformities, edema,  or skin discoloration.  Skin:  No rashes or lesions.  Neurological Exam: MENTAL STATUS including orientation to time, place, person, recent and remote memory, attention span and concentration, language, and fund of knowledge is reduced.  Speech is not dysarthric.  CRANIAL  NERVES: II: L eye blind " I cannot see the object, see dark"  R eye normal, fundi not visualized.  III-IV-VI: Pupils equal round and reactive to light.  Normal conjugate, extra-ocular eye movements in all directions of gaze.  No nystagmus.  No ptosis .   V:  Normal facial sensation.    VII: Mild chronic left facial droop, normal movements.   VIII:  Normal hearing and vestibular function.   IX-X:  Normal palatal movement.   XI:  Normal shoulder shrug and head rotation.   XII:  Normal tongue strength and range of motion, no deviation or fasciculation.  MOTOR:  No atrophy, fasciculations, L>R resting tremor. No intention tremor  No pronator drift.    SENSORY:  Normal and symmetric perception of light touch, pinprick, vibration, and proprioception.     COORDINATION/GAIT: Normal finger-to- nose-finger and heel-to-shin.  Intact rapid alternating movements bilaterally.  Able to rise from a  wheelchair, needs to use her arms or help. using arms.  Gait narrow based  short stride.     Total time spent:  51 mins    Thank you for allowing me to participate in patient's care.  If I can answer any additional questions, I would be pleased to do so.    Sincerely,   Tex Filbert, PA-C

## 2023-12-27 DIAGNOSIS — M6281 Muscle weakness (generalized): Secondary | ICD-10-CM | POA: Diagnosis not present

## 2023-12-27 DIAGNOSIS — E569 Vitamin deficiency, unspecified: Secondary | ICD-10-CM | POA: Diagnosis not present

## 2023-12-27 DIAGNOSIS — E039 Hypothyroidism, unspecified: Secondary | ICD-10-CM | POA: Diagnosis not present

## 2023-12-27 DIAGNOSIS — I6381 Other cerebral infarction due to occlusion or stenosis of small artery: Secondary | ICD-10-CM | POA: Diagnosis not present

## 2023-12-27 DIAGNOSIS — E785 Hyperlipidemia, unspecified: Secondary | ICD-10-CM | POA: Diagnosis not present

## 2023-12-27 DIAGNOSIS — R262 Difficulty in walking, not elsewhere classified: Secondary | ICD-10-CM | POA: Diagnosis not present

## 2023-12-28 ENCOUNTER — Telehealth: Payer: Self-pay | Admitting: Nurse Practitioner

## 2023-12-28 ENCOUNTER — Ambulatory Visit: Attending: Vascular Surgery | Admitting: Physician Assistant

## 2023-12-28 VITALS — BP 103/64 | HR 71 | Temp 98.1°F | Wt 163.9 lb

## 2023-12-28 DIAGNOSIS — I6523 Occlusion and stenosis of bilateral carotid arteries: Secondary | ICD-10-CM

## 2023-12-28 NOTE — Progress Notes (Signed)
 POST OPERATIVE OFFICE NOTE    CC:  F/u for surgery  HPI:  This is a 83 y.o. female who is s/p right carotid endarterectomy with bovine patch angioplasty by Dr. Charlotte Cookey on 12/13/2023.  This was performed due to asymptomatic high-grade stenosis of the right ICA.  She is a resident at [facility] nursing home.  She is accompanied today by her daughter.  They believe the incision is healing well.  She is moving all extremities well and feels better after surgery.  She is on aspirin , Plavix , statin daily.  She has history of a femoral to femoral bypass graft.  She denies rest pain or tissue loss of bilateral lower extremities.  Allergies  Allergen Reactions   Lidocaine  Swelling and Other (See Comments)    Use CORRECT dose for age/weight- red welts and tongue swelling result, if not   Cymbalta  [Duloxetine  Hcl] Other (See Comments)    Increased confusion and memory concerns    Donepezil Other (See Comments)    Altered mood and Aggression     Mobic [Meloxicam] Other (See Comments)    "Heart failure"- cannot take   Namenda [Memantine] Other (See Comments)    Severe aggression   Nsaids Other (See Comments)    Cannot have due to heart issues   Other Other (See Comments)    No salt or anything that might cause fluid retention/swelling!!   Oxycontin  [Oxycodone ] Other (See Comments)    "Toxic dementia" - daughter feels like she could take this, however(??)   Vicodin [Hydrocodone-Acetaminophen ] Other (See Comments)    Delirium, Confusion, and Toxic dementia    Current Outpatient Medications  Medication Sig Dispense Refill   albuterol  (VENTOLIN  HFA) 108 (90 Base) MCG/ACT inhaler Inhale 2 puffs into the lungs every 6 (six) hours as needed for wheezing or shortness of breath. 8 g 2   ALPRAZolam  (XANAX ) 0.5 MG tablet Take 1 tablet (0.5 mg total) by mouth daily as needed for up to 2 doses for anxiety. 2 tablet 0   aspirin  EC 81 MG tablet Take 81 mg by mouth at bedtime.     atorvastatin  (LIPITOR) 40 MG  tablet Take 1 tablet (40 mg total) by mouth daily.     busPIRone  (BUSPAR ) 15 MG tablet Take 1 tablet (15 mg total) by mouth 2 (two) times daily. 180 tablet 1   Cholecalciferol  (VITAMIN D3) 1.25 MG (50000 UT) CAPS Take 1 capsule by mouth once a week.     clopidogrel  (PLAVIX ) 75 MG tablet TAKE 1 TABLET(75 MG) BY MOUTH DAILY (Patient taking differently: Take 75 mg by mouth at bedtime.) 90 tablet 3   Cyanocobalamin  (VITAMIN B-12 PO) Take 1 tablet by mouth in the morning.     divalproex  (DEPAKOTE  ER) 250 MG 24 hr tablet Take 1 tablet (250 mg total) by mouth at bedtime. 90 tablet 1   dorzolamide  (TRUSOPT ) 2 % ophthalmic solution Place 1 drop into both eyes in the morning and at bedtime.     escitalopram  (LEXAPRO ) 20 MG tablet Take 1 tablet (20 mg total) by mouth daily. 90 tablet 1   ezetimibe  (ZETIA ) 10 MG tablet Take 1 tablet (10 mg total) by mouth daily. 90 tablet 1   furosemide  (LASIX ) 40 MG tablet Take 1 tablet (40 mg total) by mouth daily as needed for fluid or edema.     ketoconazole  (NIZORAL ) 2 % shampoo APPLY 10ML TOPICALLY 2 TIMES A WEEK. 120 mL 5   levothyroxine  (SYNTHROID ) 150 MCG tablet Take 1 tablet (150 mcg total)  by mouth daily at 6 (six) AM.     meclizine  (ANTIVERT ) 25 MG tablet Take 1 tablet (25 mg total) by mouth as needed for dizziness. (Patient taking differently: Take 25 mg by mouth 3 (three) times daily as needed for dizziness.) 30 tablet 0   metoprolol  succinate (TOPROL -XL) 25 MG 24 hr tablet Take 1 tablet (25 mg total) by mouth daily. 90 tablet 1   nitroGLYCERIN  (NITROSTAT ) 0.4 MG SL tablet Place 1 tablet (0.4 mg total) under the tongue every 5 (five) minutes x 3 doses as needed for chest pain. (Patient not taking: Reported on 12/16/2023) 25 tablet 1   pantoprazole  (PROTONIX ) 40 MG tablet Take 1 tablet (40 mg total) by mouth daily. 90 tablet 1   QUEtiapine  (SEROQUEL ) 25 MG tablet Take 1 tablet (25 mg total) by mouth at bedtime. Would wean as tolerated     TYLENOL  500 MG tablet Take  500-1,000 mg by mouth 2 (two) times daily.     No current facility-administered medications for this visit.     ROS:  See HPI  Physical Exam:  Vitals:   12/28/23 1024  BP: 103/64  Pulse: 71  Temp: 98.1 F (36.7 C)  TempSrc: Temporal  SpO2: 93%  Weight: 163 lb 14.4 oz (74.3 kg)    Incision: Right neck incision well-healed; still has surgical glue Extremities: Moving all extremities well Neuro: A&O; cranial nerves grossly intact; right lower lip weakness  Assessment/Plan:  This is a 83 y.o. female who is s/p: Right carotid endarterectomy due to asymptomatic high-grade stenosis  Ms. Eagles is subjectively doing very well after surgery.  She denies any neurological events since discharge.  Her right neck incision is well-healed.  She can start to remove glue from the right neck incision during her showers.  She will continue her Plavix , aspirin , statin daily.  We will check a carotid duplex in 9 months per protocol.  We will also schedule her femoral to femoral bypass surveillance and ABI at the same time.  The patient and her daughter know to notify our office with any questions or concerns.   Cordie Deters, PA-C [facility] [phone]  Clinic MD:  Rosalva Comber

## 2023-12-28 NOTE — Telephone Encounter (Signed)
 Received [employer] Chronic Condition Verification Form.  Placed in Jessica's folder to review, fill out and sign.

## 2023-12-28 NOTE — Telephone Encounter (Signed)
 Patient daughter came in and dropped off a form for [facility] insurance stated that she can't afford her mom to lose her insurance and that the form need to be signed by Camilo Cella before the end of the month and faxed back. Given to [place] in [place].  Please advise

## 2023-12-29 ENCOUNTER — Ambulatory Visit (INDEPENDENT_AMBULATORY_CARE_PROVIDER_SITE_OTHER): Admitting: Physician Assistant

## 2023-12-29 ENCOUNTER — Encounter: Payer: Self-pay | Admitting: Physician Assistant

## 2023-12-29 VITALS — BP 120/55 | HR 61 | Resp 20 | Ht 67.0 in

## 2023-12-29 DIAGNOSIS — F03918 Unspecified dementia, unspecified severity, with other behavioral disturbance: Secondary | ICD-10-CM | POA: Diagnosis not present

## 2023-12-29 DIAGNOSIS — I639 Cerebral infarction, unspecified: Secondary | ICD-10-CM

## 2023-12-29 NOTE — Patient Instructions (Signed)
 Follow up Dec 12 at 11:30 Continue baby ASA and Plavix  Continue PT/OT Continue the mood medications, consider discontinuing Xanax  and instead work with the current  other meds, such as Depakote , Seroquel  and Lexapro   Stay hydrated

## 2023-12-31 DIAGNOSIS — M19012 Primary osteoarthritis, left shoulder: Secondary | ICD-10-CM | POA: Diagnosis not present

## 2023-12-31 DIAGNOSIS — Z7409 Other reduced mobility: Secondary | ICD-10-CM | POA: Diagnosis not present

## 2023-12-31 DIAGNOSIS — R531 Weakness: Secondary | ICD-10-CM | POA: Diagnosis not present

## 2023-12-31 DIAGNOSIS — M25512 Pain in left shoulder: Secondary | ICD-10-CM | POA: Diagnosis not present

## 2023-12-31 DIAGNOSIS — Z9889 Other specified postprocedural states: Secondary | ICD-10-CM | POA: Diagnosis not present

## 2024-01-01 DIAGNOSIS — Z7409 Other reduced mobility: Secondary | ICD-10-CM | POA: Diagnosis not present

## 2024-01-01 DIAGNOSIS — M19012 Primary osteoarthritis, left shoulder: Secondary | ICD-10-CM | POA: Diagnosis not present

## 2024-01-01 DIAGNOSIS — M25512 Pain in left shoulder: Secondary | ICD-10-CM | POA: Diagnosis not present

## 2024-01-01 DIAGNOSIS — R531 Weakness: Secondary | ICD-10-CM | POA: Diagnosis not present

## 2024-01-02 DIAGNOSIS — M25512 Pain in left shoulder: Secondary | ICD-10-CM | POA: Diagnosis not present

## 2024-01-02 DIAGNOSIS — Z7409 Other reduced mobility: Secondary | ICD-10-CM | POA: Diagnosis not present

## 2024-01-02 DIAGNOSIS — R531 Weakness: Secondary | ICD-10-CM | POA: Diagnosis not present

## 2024-01-02 DIAGNOSIS — M19012 Primary osteoarthritis, left shoulder: Secondary | ICD-10-CM | POA: Diagnosis not present

## 2024-01-03 DIAGNOSIS — I639 Cerebral infarction, unspecified: Secondary | ICD-10-CM | POA: Diagnosis not present

## 2024-01-03 DIAGNOSIS — I10 Essential (primary) hypertension: Secondary | ICD-10-CM | POA: Diagnosis not present

## 2024-01-03 DIAGNOSIS — K219 Gastro-esophageal reflux disease without esophagitis: Secondary | ICD-10-CM | POA: Diagnosis not present

## 2024-01-05 NOTE — Telephone Encounter (Signed)
Form completed and sent to be faxed.

## 2024-01-05 NOTE — Telephone Encounter (Signed)
 Form faxed and daughter Ms Linder Revere  informed that its here for pick up

## 2024-01-07 DIAGNOSIS — I25708 Atherosclerosis of coronary artery bypass graft(s), unspecified, with other forms of angina pectoris: Secondary | ICD-10-CM | POA: Diagnosis not present

## 2024-01-07 DIAGNOSIS — Z955 Presence of coronary angioplasty implant and graft: Secondary | ICD-10-CM | POA: Diagnosis not present

## 2024-01-07 DIAGNOSIS — I5042 Chronic combined systolic (congestive) and diastolic (congestive) heart failure: Secondary | ICD-10-CM | POA: Diagnosis not present

## 2024-01-07 DIAGNOSIS — E039 Hypothyroidism, unspecified: Secondary | ICD-10-CM | POA: Diagnosis not present

## 2024-01-07 DIAGNOSIS — I774 Celiac artery compression syndrome: Secondary | ICD-10-CM | POA: Diagnosis not present

## 2024-01-07 DIAGNOSIS — I6522 Occlusion and stenosis of left carotid artery: Secondary | ICD-10-CM | POA: Diagnosis not present

## 2024-01-07 DIAGNOSIS — Z556 Problems related to health literacy: Secondary | ICD-10-CM | POA: Diagnosis not present

## 2024-01-07 DIAGNOSIS — Z87820 Personal history of traumatic brain injury: Secondary | ICD-10-CM | POA: Diagnosis not present

## 2024-01-07 DIAGNOSIS — I130 Hypertensive heart and chronic kidney disease with heart failure and stage 1 through stage 4 chronic kidney disease, or unspecified chronic kidney disease: Secondary | ICD-10-CM | POA: Diagnosis not present

## 2024-01-07 DIAGNOSIS — G4733 Obstructive sleep apnea (adult) (pediatric): Secondary | ICD-10-CM | POA: Diagnosis not present

## 2024-01-07 DIAGNOSIS — Z7902 Long term (current) use of antithrombotics/antiplatelets: Secondary | ICD-10-CM | POA: Diagnosis not present

## 2024-01-07 DIAGNOSIS — N184 Chronic kidney disease, stage 4 (severe): Secondary | ICD-10-CM | POA: Diagnosis not present

## 2024-01-07 DIAGNOSIS — Z87440 Personal history of urinary (tract) infections: Secondary | ICD-10-CM | POA: Diagnosis not present

## 2024-01-07 DIAGNOSIS — Z48812 Encounter for surgical aftercare following surgery on the circulatory system: Secondary | ICD-10-CM | POA: Diagnosis not present

## 2024-01-07 DIAGNOSIS — I480 Paroxysmal atrial fibrillation: Secondary | ICD-10-CM | POA: Diagnosis not present

## 2024-01-07 DIAGNOSIS — I739 Peripheral vascular disease, unspecified: Secondary | ICD-10-CM | POA: Diagnosis not present

## 2024-01-07 DIAGNOSIS — Z7982 Long term (current) use of aspirin: Secondary | ICD-10-CM | POA: Diagnosis not present

## 2024-01-07 DIAGNOSIS — Z951 Presence of aortocoronary bypass graft: Secondary | ICD-10-CM | POA: Diagnosis not present

## 2024-01-10 DIAGNOSIS — I5042 Chronic combined systolic (congestive) and diastolic (congestive) heart failure: Secondary | ICD-10-CM | POA: Diagnosis not present

## 2024-01-10 DIAGNOSIS — I6522 Occlusion and stenosis of left carotid artery: Secondary | ICD-10-CM | POA: Diagnosis not present

## 2024-01-10 DIAGNOSIS — Z87820 Personal history of traumatic brain injury: Secondary | ICD-10-CM | POA: Diagnosis not present

## 2024-01-10 DIAGNOSIS — G4733 Obstructive sleep apnea (adult) (pediatric): Secondary | ICD-10-CM | POA: Diagnosis not present

## 2024-01-10 DIAGNOSIS — I480 Paroxysmal atrial fibrillation: Secondary | ICD-10-CM | POA: Diagnosis not present

## 2024-01-10 DIAGNOSIS — Z556 Problems related to health literacy: Secondary | ICD-10-CM | POA: Diagnosis not present

## 2024-01-10 DIAGNOSIS — I25708 Atherosclerosis of coronary artery bypass graft(s), unspecified, with other forms of angina pectoris: Secondary | ICD-10-CM | POA: Diagnosis not present

## 2024-01-10 DIAGNOSIS — Z48812 Encounter for surgical aftercare following surgery on the circulatory system: Secondary | ICD-10-CM | POA: Diagnosis not present

## 2024-01-10 DIAGNOSIS — Z951 Presence of aortocoronary bypass graft: Secondary | ICD-10-CM | POA: Diagnosis not present

## 2024-01-10 DIAGNOSIS — I739 Peripheral vascular disease, unspecified: Secondary | ICD-10-CM | POA: Diagnosis not present

## 2024-01-10 DIAGNOSIS — Z7902 Long term (current) use of antithrombotics/antiplatelets: Secondary | ICD-10-CM | POA: Diagnosis not present

## 2024-01-10 DIAGNOSIS — Z955 Presence of coronary angioplasty implant and graft: Secondary | ICD-10-CM | POA: Diagnosis not present

## 2024-01-10 DIAGNOSIS — Z7982 Long term (current) use of aspirin: Secondary | ICD-10-CM | POA: Diagnosis not present

## 2024-01-10 DIAGNOSIS — I130 Hypertensive heart and chronic kidney disease with heart failure and stage 1 through stage 4 chronic kidney disease, or unspecified chronic kidney disease: Secondary | ICD-10-CM | POA: Diagnosis not present

## 2024-01-10 DIAGNOSIS — I774 Celiac artery compression syndrome: Secondary | ICD-10-CM | POA: Diagnosis not present

## 2024-01-10 DIAGNOSIS — E039 Hypothyroidism, unspecified: Secondary | ICD-10-CM | POA: Diagnosis not present

## 2024-01-10 DIAGNOSIS — N184 Chronic kidney disease, stage 4 (severe): Secondary | ICD-10-CM | POA: Diagnosis not present

## 2024-01-10 DIAGNOSIS — Z87440 Personal history of urinary (tract) infections: Secondary | ICD-10-CM | POA: Diagnosis not present

## 2024-01-17 DIAGNOSIS — I480 Paroxysmal atrial fibrillation: Secondary | ICD-10-CM | POA: Diagnosis not present

## 2024-01-17 DIAGNOSIS — E039 Hypothyroidism, unspecified: Secondary | ICD-10-CM | POA: Diagnosis not present

## 2024-01-17 DIAGNOSIS — M19012 Primary osteoarthritis, left shoulder: Secondary | ICD-10-CM | POA: Diagnosis not present

## 2024-01-17 DIAGNOSIS — I130 Hypertensive heart and chronic kidney disease with heart failure and stage 1 through stage 4 chronic kidney disease, or unspecified chronic kidney disease: Secondary | ICD-10-CM | POA: Diagnosis not present

## 2024-01-17 DIAGNOSIS — I25708 Atherosclerosis of coronary artery bypass graft(s), unspecified, with other forms of angina pectoris: Secondary | ICD-10-CM | POA: Diagnosis not present

## 2024-01-17 DIAGNOSIS — Z87440 Personal history of urinary (tract) infections: Secondary | ICD-10-CM | POA: Diagnosis not present

## 2024-01-17 DIAGNOSIS — N184 Chronic kidney disease, stage 4 (severe): Secondary | ICD-10-CM | POA: Diagnosis not present

## 2024-01-17 DIAGNOSIS — Z48812 Encounter for surgical aftercare following surgery on the circulatory system: Secondary | ICD-10-CM | POA: Diagnosis not present

## 2024-01-17 DIAGNOSIS — I774 Celiac artery compression syndrome: Secondary | ICD-10-CM | POA: Diagnosis not present

## 2024-01-17 DIAGNOSIS — Z955 Presence of coronary angioplasty implant and graft: Secondary | ICD-10-CM | POA: Diagnosis not present

## 2024-01-17 DIAGNOSIS — E782 Mixed hyperlipidemia: Secondary | ICD-10-CM | POA: Diagnosis not present

## 2024-01-17 DIAGNOSIS — I739 Peripheral vascular disease, unspecified: Secondary | ICD-10-CM | POA: Diagnosis not present

## 2024-01-17 DIAGNOSIS — M19011 Primary osteoarthritis, right shoulder: Secondary | ICD-10-CM | POA: Diagnosis not present

## 2024-01-17 DIAGNOSIS — I69392 Facial weakness following cerebral infarction: Secondary | ICD-10-CM | POA: Diagnosis not present

## 2024-01-17 DIAGNOSIS — Z951 Presence of aortocoronary bypass graft: Secondary | ICD-10-CM | POA: Diagnosis not present

## 2024-01-17 DIAGNOSIS — G4733 Obstructive sleep apnea (adult) (pediatric): Secondary | ICD-10-CM | POA: Diagnosis not present

## 2024-01-17 DIAGNOSIS — Z556 Problems related to health literacy: Secondary | ICD-10-CM | POA: Diagnosis not present

## 2024-01-17 DIAGNOSIS — I5042 Chronic combined systolic (congestive) and diastolic (congestive) heart failure: Secondary | ICD-10-CM | POA: Diagnosis not present

## 2024-01-18 ENCOUNTER — Telehealth: Payer: Self-pay

## 2024-01-18 NOTE — Telephone Encounter (Signed)
 Deanna [name], PT with [facility] called asking for verbal orders for pt.  Verbal orders given:  1x/week for 4 weeks for generalized mobility, strengthening, balance and fall prevention.  Left on secure line:  [phone]

## 2024-01-22 DIAGNOSIS — Z955 Presence of coronary angioplasty implant and graft: Secondary | ICD-10-CM | POA: Diagnosis not present

## 2024-01-22 DIAGNOSIS — Z951 Presence of aortocoronary bypass graft: Secondary | ICD-10-CM | POA: Diagnosis not present

## 2024-01-22 DIAGNOSIS — E782 Mixed hyperlipidemia: Secondary | ICD-10-CM | POA: Diagnosis not present

## 2024-01-22 DIAGNOSIS — E039 Hypothyroidism, unspecified: Secondary | ICD-10-CM | POA: Diagnosis not present

## 2024-01-22 DIAGNOSIS — I739 Peripheral vascular disease, unspecified: Secondary | ICD-10-CM | POA: Diagnosis not present

## 2024-01-22 DIAGNOSIS — I25708 Atherosclerosis of coronary artery bypass graft(s), unspecified, with other forms of angina pectoris: Secondary | ICD-10-CM | POA: Diagnosis not present

## 2024-01-22 DIAGNOSIS — M19011 Primary osteoarthritis, right shoulder: Secondary | ICD-10-CM | POA: Diagnosis not present

## 2024-01-22 DIAGNOSIS — I130 Hypertensive heart and chronic kidney disease with heart failure and stage 1 through stage 4 chronic kidney disease, or unspecified chronic kidney disease: Secondary | ICD-10-CM | POA: Diagnosis not present

## 2024-01-22 DIAGNOSIS — N184 Chronic kidney disease, stage 4 (severe): Secondary | ICD-10-CM | POA: Diagnosis not present

## 2024-01-22 DIAGNOSIS — G4733 Obstructive sleep apnea (adult) (pediatric): Secondary | ICD-10-CM | POA: Diagnosis not present

## 2024-01-22 DIAGNOSIS — I69392 Facial weakness following cerebral infarction: Secondary | ICD-10-CM | POA: Diagnosis not present

## 2024-01-22 DIAGNOSIS — I5042 Chronic combined systolic (congestive) and diastolic (congestive) heart failure: Secondary | ICD-10-CM | POA: Diagnosis not present

## 2024-01-22 DIAGNOSIS — M19012 Primary osteoarthritis, left shoulder: Secondary | ICD-10-CM | POA: Diagnosis not present

## 2024-01-22 DIAGNOSIS — Z556 Problems related to health literacy: Secondary | ICD-10-CM | POA: Diagnosis not present

## 2024-01-22 DIAGNOSIS — I774 Celiac artery compression syndrome: Secondary | ICD-10-CM | POA: Diagnosis not present

## 2024-01-22 DIAGNOSIS — I480 Paroxysmal atrial fibrillation: Secondary | ICD-10-CM | POA: Diagnosis not present

## 2024-01-22 DIAGNOSIS — Z48812 Encounter for surgical aftercare following surgery on the circulatory system: Secondary | ICD-10-CM | POA: Diagnosis not present

## 2024-01-22 DIAGNOSIS — Z87440 Personal history of urinary (tract) infections: Secondary | ICD-10-CM | POA: Diagnosis not present

## 2024-01-24 DIAGNOSIS — M19012 Primary osteoarthritis, left shoulder: Secondary | ICD-10-CM | POA: Diagnosis not present

## 2024-01-24 DIAGNOSIS — M19011 Primary osteoarthritis, right shoulder: Secondary | ICD-10-CM | POA: Diagnosis not present

## 2024-01-24 DIAGNOSIS — Z951 Presence of aortocoronary bypass graft: Secondary | ICD-10-CM | POA: Diagnosis not present

## 2024-01-24 DIAGNOSIS — G4733 Obstructive sleep apnea (adult) (pediatric): Secondary | ICD-10-CM | POA: Diagnosis not present

## 2024-01-24 DIAGNOSIS — N184 Chronic kidney disease, stage 4 (severe): Secondary | ICD-10-CM | POA: Diagnosis not present

## 2024-01-24 DIAGNOSIS — Z48812 Encounter for surgical aftercare following surgery on the circulatory system: Secondary | ICD-10-CM | POA: Diagnosis not present

## 2024-01-24 DIAGNOSIS — I480 Paroxysmal atrial fibrillation: Secondary | ICD-10-CM | POA: Diagnosis not present

## 2024-01-24 DIAGNOSIS — I774 Celiac artery compression syndrome: Secondary | ICD-10-CM | POA: Diagnosis not present

## 2024-01-24 DIAGNOSIS — E039 Hypothyroidism, unspecified: Secondary | ICD-10-CM | POA: Diagnosis not present

## 2024-01-24 DIAGNOSIS — I69392 Facial weakness following cerebral infarction: Secondary | ICD-10-CM | POA: Diagnosis not present

## 2024-01-24 DIAGNOSIS — Z955 Presence of coronary angioplasty implant and graft: Secondary | ICD-10-CM | POA: Diagnosis not present

## 2024-01-24 DIAGNOSIS — I5042 Chronic combined systolic (congestive) and diastolic (congestive) heart failure: Secondary | ICD-10-CM | POA: Diagnosis not present

## 2024-01-24 DIAGNOSIS — E782 Mixed hyperlipidemia: Secondary | ICD-10-CM | POA: Diagnosis not present

## 2024-01-24 DIAGNOSIS — I25708 Atherosclerosis of coronary artery bypass graft(s), unspecified, with other forms of angina pectoris: Secondary | ICD-10-CM | POA: Diagnosis not present

## 2024-01-24 DIAGNOSIS — Z87440 Personal history of urinary (tract) infections: Secondary | ICD-10-CM | POA: Diagnosis not present

## 2024-01-24 DIAGNOSIS — I739 Peripheral vascular disease, unspecified: Secondary | ICD-10-CM | POA: Diagnosis not present

## 2024-01-24 DIAGNOSIS — I130 Hypertensive heart and chronic kidney disease with heart failure and stage 1 through stage 4 chronic kidney disease, or unspecified chronic kidney disease: Secondary | ICD-10-CM | POA: Diagnosis not present

## 2024-01-24 DIAGNOSIS — Z556 Problems related to health literacy: Secondary | ICD-10-CM | POA: Diagnosis not present

## 2024-01-26 DIAGNOSIS — I774 Celiac artery compression syndrome: Secondary | ICD-10-CM | POA: Diagnosis not present

## 2024-01-26 DIAGNOSIS — I480 Paroxysmal atrial fibrillation: Secondary | ICD-10-CM | POA: Diagnosis not present

## 2024-01-26 DIAGNOSIS — Z87440 Personal history of urinary (tract) infections: Secondary | ICD-10-CM | POA: Diagnosis not present

## 2024-01-26 DIAGNOSIS — Z48812 Encounter for surgical aftercare following surgery on the circulatory system: Secondary | ICD-10-CM | POA: Diagnosis not present

## 2024-01-26 DIAGNOSIS — M19011 Primary osteoarthritis, right shoulder: Secondary | ICD-10-CM | POA: Diagnosis not present

## 2024-01-26 DIAGNOSIS — I25708 Atherosclerosis of coronary artery bypass graft(s), unspecified, with other forms of angina pectoris: Secondary | ICD-10-CM | POA: Diagnosis not present

## 2024-01-26 DIAGNOSIS — I69392 Facial weakness following cerebral infarction: Secondary | ICD-10-CM | POA: Diagnosis not present

## 2024-01-26 DIAGNOSIS — G4733 Obstructive sleep apnea (adult) (pediatric): Secondary | ICD-10-CM | POA: Diagnosis not present

## 2024-01-26 DIAGNOSIS — M19012 Primary osteoarthritis, left shoulder: Secondary | ICD-10-CM | POA: Diagnosis not present

## 2024-01-26 DIAGNOSIS — E782 Mixed hyperlipidemia: Secondary | ICD-10-CM | POA: Diagnosis not present

## 2024-01-26 DIAGNOSIS — I739 Peripheral vascular disease, unspecified: Secondary | ICD-10-CM | POA: Diagnosis not present

## 2024-01-26 DIAGNOSIS — I5042 Chronic combined systolic (congestive) and diastolic (congestive) heart failure: Secondary | ICD-10-CM | POA: Diagnosis not present

## 2024-01-26 DIAGNOSIS — I130 Hypertensive heart and chronic kidney disease with heart failure and stage 1 through stage 4 chronic kidney disease, or unspecified chronic kidney disease: Secondary | ICD-10-CM | POA: Diagnosis not present

## 2024-01-26 DIAGNOSIS — N184 Chronic kidney disease, stage 4 (severe): Secondary | ICD-10-CM | POA: Diagnosis not present

## 2024-01-26 DIAGNOSIS — Z955 Presence of coronary angioplasty implant and graft: Secondary | ICD-10-CM | POA: Diagnosis not present

## 2024-01-26 DIAGNOSIS — Z556 Problems related to health literacy: Secondary | ICD-10-CM | POA: Diagnosis not present

## 2024-01-26 DIAGNOSIS — Z951 Presence of aortocoronary bypass graft: Secondary | ICD-10-CM | POA: Diagnosis not present

## 2024-01-26 DIAGNOSIS — E039 Hypothyroidism, unspecified: Secondary | ICD-10-CM | POA: Diagnosis not present

## 2024-01-29 DIAGNOSIS — N184 Chronic kidney disease, stage 4 (severe): Secondary | ICD-10-CM | POA: Diagnosis not present

## 2024-01-29 DIAGNOSIS — Z556 Problems related to health literacy: Secondary | ICD-10-CM | POA: Diagnosis not present

## 2024-01-29 DIAGNOSIS — M19012 Primary osteoarthritis, left shoulder: Secondary | ICD-10-CM | POA: Diagnosis not present

## 2024-01-29 DIAGNOSIS — I25708 Atherosclerosis of coronary artery bypass graft(s), unspecified, with other forms of angina pectoris: Secondary | ICD-10-CM | POA: Diagnosis not present

## 2024-01-29 DIAGNOSIS — E782 Mixed hyperlipidemia: Secondary | ICD-10-CM | POA: Diagnosis not present

## 2024-01-29 DIAGNOSIS — Z951 Presence of aortocoronary bypass graft: Secondary | ICD-10-CM | POA: Diagnosis not present

## 2024-01-29 DIAGNOSIS — Z87440 Personal history of urinary (tract) infections: Secondary | ICD-10-CM | POA: Diagnosis not present

## 2024-01-29 DIAGNOSIS — I130 Hypertensive heart and chronic kidney disease with heart failure and stage 1 through stage 4 chronic kidney disease, or unspecified chronic kidney disease: Secondary | ICD-10-CM | POA: Diagnosis not present

## 2024-01-29 DIAGNOSIS — I5042 Chronic combined systolic (congestive) and diastolic (congestive) heart failure: Secondary | ICD-10-CM | POA: Diagnosis not present

## 2024-01-29 DIAGNOSIS — Z955 Presence of coronary angioplasty implant and graft: Secondary | ICD-10-CM | POA: Diagnosis not present

## 2024-01-29 DIAGNOSIS — I480 Paroxysmal atrial fibrillation: Secondary | ICD-10-CM | POA: Diagnosis not present

## 2024-01-29 DIAGNOSIS — G4733 Obstructive sleep apnea (adult) (pediatric): Secondary | ICD-10-CM | POA: Diagnosis not present

## 2024-01-29 DIAGNOSIS — I774 Celiac artery compression syndrome: Secondary | ICD-10-CM | POA: Diagnosis not present

## 2024-01-29 DIAGNOSIS — I739 Peripheral vascular disease, unspecified: Secondary | ICD-10-CM | POA: Diagnosis not present

## 2024-01-29 DIAGNOSIS — E039 Hypothyroidism, unspecified: Secondary | ICD-10-CM | POA: Diagnosis not present

## 2024-01-29 DIAGNOSIS — Z48812 Encounter for surgical aftercare following surgery on the circulatory system: Secondary | ICD-10-CM | POA: Diagnosis not present

## 2024-01-29 DIAGNOSIS — I69392 Facial weakness following cerebral infarction: Secondary | ICD-10-CM | POA: Diagnosis not present

## 2024-01-29 DIAGNOSIS — M19011 Primary osteoarthritis, right shoulder: Secondary | ICD-10-CM | POA: Diagnosis not present

## 2024-01-30 DIAGNOSIS — M19012 Primary osteoarthritis, left shoulder: Secondary | ICD-10-CM | POA: Diagnosis not present

## 2024-01-30 DIAGNOSIS — Z48812 Encounter for surgical aftercare following surgery on the circulatory system: Secondary | ICD-10-CM | POA: Diagnosis not present

## 2024-01-30 DIAGNOSIS — E782 Mixed hyperlipidemia: Secondary | ICD-10-CM | POA: Diagnosis not present

## 2024-01-30 DIAGNOSIS — Z955 Presence of coronary angioplasty implant and graft: Secondary | ICD-10-CM | POA: Diagnosis not present

## 2024-01-30 DIAGNOSIS — Z951 Presence of aortocoronary bypass graft: Secondary | ICD-10-CM | POA: Diagnosis not present

## 2024-01-30 DIAGNOSIS — E039 Hypothyroidism, unspecified: Secondary | ICD-10-CM | POA: Diagnosis not present

## 2024-01-30 DIAGNOSIS — N184 Chronic kidney disease, stage 4 (severe): Secondary | ICD-10-CM | POA: Diagnosis not present

## 2024-01-30 DIAGNOSIS — I130 Hypertensive heart and chronic kidney disease with heart failure and stage 1 through stage 4 chronic kidney disease, or unspecified chronic kidney disease: Secondary | ICD-10-CM | POA: Diagnosis not present

## 2024-01-30 DIAGNOSIS — I69392 Facial weakness following cerebral infarction: Secondary | ICD-10-CM | POA: Diagnosis not present

## 2024-01-30 DIAGNOSIS — I774 Celiac artery compression syndrome: Secondary | ICD-10-CM | POA: Diagnosis not present

## 2024-01-30 DIAGNOSIS — M19011 Primary osteoarthritis, right shoulder: Secondary | ICD-10-CM | POA: Diagnosis not present

## 2024-01-30 DIAGNOSIS — Z556 Problems related to health literacy: Secondary | ICD-10-CM | POA: Diagnosis not present

## 2024-01-30 DIAGNOSIS — I739 Peripheral vascular disease, unspecified: Secondary | ICD-10-CM | POA: Diagnosis not present

## 2024-01-30 DIAGNOSIS — I480 Paroxysmal atrial fibrillation: Secondary | ICD-10-CM | POA: Diagnosis not present

## 2024-01-30 DIAGNOSIS — Z87440 Personal history of urinary (tract) infections: Secondary | ICD-10-CM | POA: Diagnosis not present

## 2024-01-30 DIAGNOSIS — G4733 Obstructive sleep apnea (adult) (pediatric): Secondary | ICD-10-CM | POA: Diagnosis not present

## 2024-01-30 DIAGNOSIS — I25708 Atherosclerosis of coronary artery bypass graft(s), unspecified, with other forms of angina pectoris: Secondary | ICD-10-CM | POA: Diagnosis not present

## 2024-01-30 DIAGNOSIS — I5042 Chronic combined systolic (congestive) and diastolic (congestive) heart failure: Secondary | ICD-10-CM | POA: Diagnosis not present

## 2024-02-01 DIAGNOSIS — I25708 Atherosclerosis of coronary artery bypass graft(s), unspecified, with other forms of angina pectoris: Secondary | ICD-10-CM | POA: Diagnosis not present

## 2024-02-01 DIAGNOSIS — Z556 Problems related to health literacy: Secondary | ICD-10-CM | POA: Diagnosis not present

## 2024-02-01 DIAGNOSIS — I739 Peripheral vascular disease, unspecified: Secondary | ICD-10-CM | POA: Diagnosis not present

## 2024-02-01 DIAGNOSIS — I69392 Facial weakness following cerebral infarction: Secondary | ICD-10-CM | POA: Diagnosis not present

## 2024-02-01 DIAGNOSIS — I5042 Chronic combined systolic (congestive) and diastolic (congestive) heart failure: Secondary | ICD-10-CM | POA: Diagnosis not present

## 2024-02-01 DIAGNOSIS — N184 Chronic kidney disease, stage 4 (severe): Secondary | ICD-10-CM | POA: Diagnosis not present

## 2024-02-01 DIAGNOSIS — Z87440 Personal history of urinary (tract) infections: Secondary | ICD-10-CM | POA: Diagnosis not present

## 2024-02-01 DIAGNOSIS — I774 Celiac artery compression syndrome: Secondary | ICD-10-CM | POA: Diagnosis not present

## 2024-02-01 DIAGNOSIS — Z955 Presence of coronary angioplasty implant and graft: Secondary | ICD-10-CM | POA: Diagnosis not present

## 2024-02-01 DIAGNOSIS — I130 Hypertensive heart and chronic kidney disease with heart failure and stage 1 through stage 4 chronic kidney disease, or unspecified chronic kidney disease: Secondary | ICD-10-CM | POA: Diagnosis not present

## 2024-02-01 DIAGNOSIS — Z951 Presence of aortocoronary bypass graft: Secondary | ICD-10-CM | POA: Diagnosis not present

## 2024-02-01 DIAGNOSIS — I480 Paroxysmal atrial fibrillation: Secondary | ICD-10-CM | POA: Diagnosis not present

## 2024-02-01 DIAGNOSIS — G4733 Obstructive sleep apnea (adult) (pediatric): Secondary | ICD-10-CM | POA: Diagnosis not present

## 2024-02-01 DIAGNOSIS — E782 Mixed hyperlipidemia: Secondary | ICD-10-CM | POA: Diagnosis not present

## 2024-02-01 DIAGNOSIS — M19012 Primary osteoarthritis, left shoulder: Secondary | ICD-10-CM | POA: Diagnosis not present

## 2024-02-01 DIAGNOSIS — Z48812 Encounter for surgical aftercare following surgery on the circulatory system: Secondary | ICD-10-CM | POA: Diagnosis not present

## 2024-02-01 DIAGNOSIS — M19011 Primary osteoarthritis, right shoulder: Secondary | ICD-10-CM | POA: Diagnosis not present

## 2024-02-01 DIAGNOSIS — E039 Hypothyroidism, unspecified: Secondary | ICD-10-CM | POA: Diagnosis not present

## 2024-02-05 ENCOUNTER — Ambulatory Visit: Attending: Cardiovascular Disease | Admitting: Cardiovascular Disease

## 2024-02-05 ENCOUNTER — Encounter: Payer: Self-pay | Admitting: Cardiovascular Disease

## 2024-02-05 VITALS — BP 142/80 | HR 81 | Ht 61.5 in | Wt 165.2 lb

## 2024-02-05 DIAGNOSIS — I6523 Occlusion and stenosis of bilateral carotid arteries: Secondary | ICD-10-CM

## 2024-02-05 DIAGNOSIS — G458 Other transient cerebral ischemic attacks and related syndromes: Secondary | ICD-10-CM

## 2024-02-05 DIAGNOSIS — I25708 Atherosclerosis of coronary artery bypass graft(s), unspecified, with other forms of angina pectoris: Secondary | ICD-10-CM | POA: Diagnosis not present

## 2024-02-05 DIAGNOSIS — I10 Essential (primary) hypertension: Secondary | ICD-10-CM | POA: Diagnosis not present

## 2024-02-05 DIAGNOSIS — I5032 Chronic diastolic (congestive) heart failure: Secondary | ICD-10-CM

## 2024-02-05 NOTE — Assessment & Plan Note (Signed)
 History of essential hypertension blood pressure measured today at 142/80.  She is on metoprolol .

## 2024-02-05 NOTE — Assessment & Plan Note (Signed)
 2D echo performed 12/17/2023 showed an EF of 40 to 45% with grade 2 diastolic dysfunction.  She did have some mild heart failure during her recent hospitalization.  She is on furosemide .

## 2024-02-05 NOTE — Assessment & Plan Note (Signed)
 History of left carotid to subclavian bypass by Dr. Serene in 2013.

## 2024-02-05 NOTE — Assessment & Plan Note (Signed)
 History of CAD status post coronary artery bypass grafting in 1999 by Dr. Michae .  She did have a cath by Dr. Dann 03/19/2020 revealing an occluded vein to the RCA which is up was occluded, patent LIMA to diagonal branch, patent vein to her distal OM and high-grade proximal calcified LAD stenosis which underwent orbital atherectomy, PCI and stenting by Dr. Dann.  She has not had any intervention since.

## 2024-02-05 NOTE — Patient Instructions (Addendum)
 Medication Instructions:   No changes *If you need a refill on your cardiac medications before your next appointment, please call your pharmacy*   Lab Work: Not  needed    Testing/Procedures:  Not needed  Follow-Up: At [facility], you and your health needs are our priority.  As part of our continuing mission to provide you with exceptional heart care, we have created designated Provider Care Teams.  These Care Teams include your primary Cardiologist (physician) and Advanced Practice Providers (APPs -  Physician Assistants and Nurse Practitioners) who all work together to provide you with the care you need, when you need it.     Your next appointment:   6 month(s)  The format for your next appointment:   In Person  Provider:   Josefa Beauvais NP and then Dorn Lesches, MD 12 months

## 2024-02-05 NOTE — Progress Notes (Signed)
 02/05/2024 Deanna [name]   Sep 15, 1940  [id number]  Primary Physician Deanna [name] POUR, NP Primary Cardiologist: Deanna JINNY Lesches MD Deanna [name], Deanna [name]  HPI:  Deanna [name] is a 83 y.o.  moderately overweight divorced Caucasian female mother of one daughter , Deanna [name]. I last saw in the office 07/15/2020. She was formerly a patient of Deanna [name]. I ultimately assumed her care. I have done multiple procedures on her in the past as well. Her primary care physician is Dr. Rolan [name].  I last saw her in the office 03/27/2019.  She has a history of coronary artery disease status post anterior wall myocardial infarction in 1994 treated with LAD angioplasty by Dr. Maye. She had circumflex intervention the following year. Ultimately she required coronary bypass grafting in 1999 by Dr. Brantley . 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 Serene. Other problems include to hypertension and hyperlipidemia. She does not smoke. She is not diabetic. She has a strong family history of heart disease. She denies chest pain, shortness of breath or claudication. She had intervention on her femorofemoral crossover graft by Dr. Serene 08/22/17 and is walking better since. She was admitted to the hospital on 11/14/17 for 2 days because of chest pain. She ruled out for myocardial infarction. A chest CT was negative with only small bilateral pleural effusions. BNP was mildly elevated and she was treated with gentle diuresis. A Myoview  stress test was nonischemic and low risk. She's had no recurrent symptoms.   She was hospitalized 03/18/2020 with a non-STEMI.  She underwent cardiac catheterization by Dr. Dann 03/19/2020 revealing an occluded vein  to the RCA, occluded native right coronary artery and circumflex, patent vein to an OM, patent vein to a diagonal branch, high-grade calcified proximal LAD stenosis and occluded mid LAD after this diagonal branch.  She underwent complex orbital atherectomy, PCI and stenting of the proximal LAD by Dr. Dann.  She did have left-to-right collaterals.  Her EF was in the 25 to 35% range.  She also underwent EGD for what was described as upper GI bleed and was treated medically.  After discharge she spent a month at [facility] getting rehabilitation.  She has been home for last 2 months living with her daughter Deanna [name].  She walks with a walker and is slowly getting her strength back.  Since I saw her 3-1/2 years ago she was recently hospitalized for stroke after having undergone elective right carotid endarterectomy by Dr. Serene on 12/13/2023 for high-grade right ICA stenosis.  She did have heart failure during hospitalization with an elevated BNP.  There is a history of PAF as well documented in the note from 1 year ago although she was never put on a DOAC.  She currently is on aspirin  and clopidogrel .   Current Meds  Medication Sig   albuterol  (VENTOLIN  HFA) 108 (90 Base) MCG/ACT inhaler Inhale 2 puffs into the lungs every 6 (six) hours as needed for wheezing or shortness of breath.   ALPRAZolam  (XANAX ) 0.5 MG tablet Take 1 tablet (0.5 mg total) by mouth daily as needed for up to 2 doses for anxiety.   aspirin  EC 81 MG tablet Take 81 mg by mouth at bedtime.   atorvastatin  (LIPITOR) 40 MG tablet Take 1 tablet (40 mg total)  by mouth daily.   busPIRone  (BUSPAR ) 15 MG tablet Take 1 tablet (15 mg total) by mouth 2 (two) times daily.   Cholecalciferol  (VITAMIN D3) 1.25 MG (50000 UT) CAPS Take 1 capsule by mouth once a week.   clopidogrel  (PLAVIX ) 75 MG tablet TAKE 1 TABLET(75 MG) BY MOUTH DAILY (Patient taking differently: Take 75 mg by mouth at bedtime.)   Cyanocobalamin  (VITAMIN B-12 PO) Take 1  tablet by mouth in the morning.   divalproex  (DEPAKOTE  ER) 250 MG 24 hr tablet Take 1 tablet (250 mg total) by mouth at bedtime.   dorzolamide  (TRUSOPT ) 2 % ophthalmic solution Place 1 drop into both eyes in the morning and at bedtime.   escitalopram  (LEXAPRO ) 20 MG tablet Take 1 tablet (20 mg total) by mouth daily.   ezetimibe  (ZETIA ) 10 MG tablet Take 1 tablet (10 mg total) by mouth daily.   furosemide  (LASIX ) 40 MG tablet Take 1 tablet (40 mg total) by mouth daily as needed for fluid or edema.   ketoconazole  (NIZORAL ) 2 % shampoo APPLY 10ML TOPICALLY 2 TIMES A WEEK.   levothyroxine  (SYNTHROID ) 150 MCG tablet Take 1 tablet (150 mcg total) by mouth daily at 6 (six) AM.   meclizine  (ANTIVERT ) 25 MG tablet Take 1 tablet (25 mg total) by mouth as needed for dizziness. (Patient taking differently: Take 25 mg by mouth 3 (three) times daily as needed for dizziness.)   metoprolol  succinate (TOPROL -XL) 25 MG 24 hr tablet Take 1 tablet (25 mg total) by mouth daily.   nitroGLYCERIN  (NITROSTAT ) 0.4 MG SL tablet Place 1 tablet (0.4 mg total) under the tongue every 5 (five) minutes x 3 doses as needed for chest pain.   pantoprazole  (PROTONIX ) 40 MG tablet Take 1 tablet (40 mg total) by mouth daily.   QUEtiapine  (SEROQUEL ) 25 MG tablet Take 1 tablet (25 mg total) by mouth at bedtime. Would wean as tolerated   TYLENOL  500 MG tablet Take 500-1,000 mg by mouth 2 (two) times daily.     Allergies  Allergen Reactions   Lidocaine  Swelling and Other (See Comments)    Use CORRECT dose for age/weight- red welts and tongue swelling result, if not   Cymbalta  [Duloxetine  Hcl] Other (See Comments)    Increased confusion and memory concerns    Donepezil Other (See Comments)    Altered mood and Aggression     Mobic [Meloxicam] Other (See Comments)    Heart failure- cannot take   Namenda [Memantine] Other (See Comments)    Severe aggression   Nsaids Other (See Comments)    Cannot have due to heart issues   Other  Other (See Comments)    No [name] or anything that might cause fluid retention/swelling!!   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, however(??)   Vicodin [Hydrocodone-Acetaminophen ] Other (See Comments)    Delirium, Confusion, and Toxic dementia    Social History   Socioeconomic History   Marital status: Divorced    Spouse name: Not on file   Number of children: Not on file   Years of education: Not on file   Highest education level: Not on file  Occupational History   Not on file  Tobacco Use   Smoking status: Former    Current packs/day: 0.00    Types: Cigarettes    Start date: 11/28/1978    Quit date: 11/27/1981    Years since quitting: 42.2   Smokeless tobacco: Never  Vaping Use   Vaping status: Never Used  Substance and Sexual Activity   Alcohol use: Not Currently    Alcohol/week: 0.0 standard drinks of alcohol   Drug use: No   Sexual activity: Not Currently    Comment: 1st intercourse 73 yo-1 partner  Other Topics Concern   Not on file  Social History Narrative   Social History      Diet? Healthy but too many sweets      Do you drink/eat things with caffeine? Yes occasionally      Marital status?                    D                What year were you married?      Do you live in a house, apartment, assisted living, condo, trailer, etc.? condo      Is it one or more stories? 1      How many persons live in your home? 2      Do you have any pets in your home? (please list) 1 cat      Highest level of education completed? 1 year college      Current or past profession: housewife      Do you exercise?           no                           Type & how often?      Advanced Directives      Do you have a living will? no      Do you have a DNR form?             no                     If not, do you want to discuss one? yes      Do you have signed POA/HPOA for forms? no      Functional Status       Do you have difficulty bathing or dressing yourself? No- gets tired      Do you have difficulty preparing food or eating? No- eats frozen entrees or poor nutrition meals      Do you have difficulty managing your medications? yes      Do you have difficulty managing your finances? yes      Do you have difficulty affording your medications? No- funds running low   Social Drivers of Health   Financial Resource Strain: Not on file  Food Insecurity: Food Insecurity Present (12/16/2023)   Hunger Vital Sign    Worried About Running Out of Food in the Last Year: Often true    Ran Out of Food in the Last Year: Often true  Transportation Needs: No Transportation Needs (12/16/2023)   PRAPARE - Administrator, Civil Service (Medical): No    Lack of Transportation (Non-Medical): No  Physical Activity: Not on file  Stress: Not on file  Social Connections: Moderately Isolated (12/16/2023)   Social Connection and Isolation Panel    Frequency of Communication with Friends and Family: Three times a week    Frequency of Social Gatherings with Friends and Family: Once a week    Attends Religious Services: 1 to 4 times per year    Active Member of Clubs or Organizations: Patient unable to answer  Attends Banker Meetings: Never    Marital Status: Divorced  Catering manager Violence: Not At Risk (12/16/2023)   Humiliation, Afraid, Rape, and Kick questionnaire    Fear of Current or Ex-Partner: No    Emotionally Abused: No    Physically Abused: No    Sexually Abused: No     Review of Systems: General: negative for chills, fever, night sweats or weight changes.  Cardiovascular: negative for chest pain, dyspnea on exertion, edema, orthopnea, palpitations, paroxysmal nocturnal dyspnea or shortness of breath Dermatological: negative for rash Respiratory: negative for cough or wheezing Urologic: negative for hematuria Abdominal: negative for nausea, vomiting, diarrhea,  bright red blood per rectum, melena, or hematemesis Neurologic: negative for visual changes, syncope, or dizziness All other systems reviewed and are otherwise negative except as noted above.    Blood pressure (!) 142/80, pulse 81, height 5' 1.5 (1.562 m), weight 165 lb 3.2 oz (74.9 kg), SpO2 95%.  General appearance: alert and no distress Neck: no adenopathy, no JVD, supple, symmetrical, trachea midline, thyroid  not enlarged, symmetric, no tenderness/mass/nodules, and bilateral carotid bruits Lungs: clear to auscultation bilaterally Heart: Soft outflow tract murmur Extremities: extremities normal, atraumatic, no cyanosis or edema Pulses: 2+ and symmetric Skin: Skin color, texture, turgor normal. No rashes or lesions Neurologic: Grossly normal  EKG not performed today      ASSESSMENT AND PLAN:   Essential hypertension History of essential hypertension blood pressure measured today at 142/80.  She is on metoprolol .  Atherosclerosis of coronary artery bypass graft(s), unspecified, with other forms of angina pectoris [facility]) History of CAD status post coronary artery bypass grafting in 1999 by Dr. Michae .  She did have a cath by Dr. Dann 03/19/2020 revealing an occluded vein to the RCA which is up was occluded, patent LIMA to diagonal branch, patent vein to her distal OM and high-grade proximal calcified LAD stenosis which underwent orbital atherectomy, PCI and stenting by Dr. Dann.  She has not had any intervention since.  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 Serene in 2013.  Carotid artery stenosis History of high-grade right ICA stenosis by duplex ultrasound in March of this year status post right carotid endarterectomy by Dr. Serene 12/13/2023.  She did have a stroke shortly after that.  Chronic diastolic CHF (congestive heart failure) (HCC) 2D echo performed 12/17/2023 showed an EF of 40 to 45% with grade 2  diastolic dysfunction.  She did have some mild heart failure during her recent hospitalization.  She is on furosemide .     Deanna DOROTHA Lesches MD FACP,FACC,FAHA, [facility] 02/05/2024 2:45 PM

## 2024-02-05 NOTE — Assessment & Plan Note (Signed)
 History of high-grade right ICA stenosis by duplex ultrasound in March of this year status post right carotid endarterectomy by Dr. Serene 12/13/2023.  She did have a stroke shortly after that.

## 2024-02-06 ENCOUNTER — Ambulatory Visit (INDEPENDENT_AMBULATORY_CARE_PROVIDER_SITE_OTHER): Admitting: Adult Health

## 2024-02-06 ENCOUNTER — Ambulatory Visit: Payer: Self-pay

## 2024-02-06 ENCOUNTER — Encounter: Payer: Self-pay | Admitting: Adult Health

## 2024-02-06 VITALS — BP 128/76 | HR 93 | Temp 97.8°F | Resp 18 | Ht 61.5 in | Wt 165.6 lb

## 2024-02-06 DIAGNOSIS — Z8673 Personal history of transient ischemic attack (TIA), and cerebral infarction without residual deficits: Secondary | ICD-10-CM | POA: Diagnosis not present

## 2024-02-06 DIAGNOSIS — N1832 Chronic kidney disease, stage 3b: Secondary | ICD-10-CM | POA: Diagnosis not present

## 2024-02-06 DIAGNOSIS — F32A Depression, unspecified: Secondary | ICD-10-CM

## 2024-02-06 DIAGNOSIS — R300 Dysuria: Secondary | ICD-10-CM

## 2024-02-06 DIAGNOSIS — I480 Paroxysmal atrial fibrillation: Secondary | ICD-10-CM

## 2024-02-06 DIAGNOSIS — E039 Hypothyroidism, unspecified: Secondary | ICD-10-CM | POA: Diagnosis not present

## 2024-02-06 DIAGNOSIS — I10 Essential (primary) hypertension: Secondary | ICD-10-CM | POA: Diagnosis not present

## 2024-02-06 DIAGNOSIS — F419 Anxiety disorder, unspecified: Secondary | ICD-10-CM

## 2024-02-06 DIAGNOSIS — I5042 Chronic combined systolic (congestive) and diastolic (congestive) heart failure: Secondary | ICD-10-CM

## 2024-02-06 DIAGNOSIS — I25810 Atherosclerosis of coronary artery bypass graft(s) without angina pectoris: Secondary | ICD-10-CM

## 2024-02-06 NOTE — Telephone Encounter (Signed)
 FYI Only or Action Required?: Action required by provider: clinical question for provider.  Patient was last seen in primary care on 09/12/2023 by Sherlynn Madden, MD. Called Nurse Triage reporting urinary concerns. Symptoms began yesterday. Interventions attempted: Rest, hydration, or home remedies. Symptoms are: unchanged.  Triage Disposition: See Physician Within 24 Hours-needing a call back  Patient/caregiver understands and will follow disposition?: No, wishes to speak with PCP  Copied from CRM [id number]. Topic: Clinical - Red Word Triage >> Feb 06, 2024 11:36 AM Mercer PEDLAR wrote: Red Word that prompted transfer to Nurse Triage: UTI, confusion and hallucinations. Reason for Disposition  Urinating more frequently than usual (i.e., frequency)  Answer Assessment - Initial Assessment Questions 1. SYMPTOM: What's the main symptom you're concerned about? (e.g., frequency, incontinence)     Urinary frequency, incontinence, behavioral concerns 2. ONSET: When did the  urinary frequency and behavioral concerns  start?     yesterday 3. PAIN: Is there any pain? If Yes, ask: How bad is it? (Scale: 1-10; mild, moderate, severe)     Unsure if she is having pain. 4. CAUSE: What do you think is causing the symptoms?     Concerned for UTI 5. OTHER SYMPTOMS: Do you have any other symptoms? (e.g., blood in urine, fever, flank pain, pain with urination)     Confusion, personality changes  Daughter calling with concerns of UTI-reports patient's behavioral changes are in line with UTI. Daughter was wanting patient to be seen today or tomorrow. Wanting to avoid another UC visit due to copays. Daughter asking for a phone call back.  Protocols used: Urinary Symptoms-A-AH

## 2024-02-06 NOTE — Telephone Encounter (Signed)
 After speaking with upper leadership we have a provider coming over for access to care. Spoke with patients daughter and patient will be seen today at 2:40 by Jereld Fredda Delude, NP

## 2024-02-06 NOTE — Progress Notes (Unsigned)
 [name] clinic  Provider:  [name]  Code Status:  Full Code  Goals of Care:     12/29/2023   10:42 AM  Advanced Directives  Does Patient Have a Medical Advance Directive? Yes  Type of Advance Directive Healthcare Power of Attorney  Does patient want to make changes to medical advance directive? No - Patient declined  Copy of Healthcare Power of Attorney in Chart? No - copy requested       Chief Complaint  Patient presents with   Urinary Tract Infection    Possible UTI ( orders is pending to be signed by [name], NP )   Discussed the use of AI scribe software for clinical note transcription with the patient, who gave verbal consent to proceed.   HPI: Patient is a 83 y.o. female seen today for an acute visit for possible UTI. She was accompanied by her daughter.  Her daughter notes that she became grumpy last night, which is often a sign of a UTI for her. This morning, she experienced a slight burning sensation during urination, which is unusual for her as she typically does not have burning with UTIs. Her urine appeared darker than usual yesterday, although she has been drinking adequate fluids, including water, fruit juice, and green tea. No fever or chills are reported.  She underwent a right carotid endarterectomy with bovine patch on 12/13/23.  She was hospitalized  5/9 to 12/24/2023 due to acute CVA.  She was reported to have increased confusion and facial droop upon admission to the hospital.  MRI done on Dec 18, 2023 showed acute infarction.  She was discharged from the hospital on May/18/2035 to [facility]  for short-term rehabilitation.  Patient was recently discharged from rehab.   She is currently using a walker for mobility. Her daughter reports that she has not been the same since the stroke, with increased mental and emotional changes. She is currently receiving home health physical therapy but daughter is seeking additional  home health aide for assistance with activities of daily living.  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 She also has a history of congestive heart failure with an EF of 40-45% as of Dec 17, 2023, and atrial fibrillation and is on aspirin , Plavix , and Toprol  XL.  She experiences anxiety and depression, for which she takes medications such as Lexapro , Xanax , Buspar , and Depakote .          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   CKD (chronic kidney disease), stage IV (HCC)    Cognitive impairment    Colon polyps    Community acquired pneumonia    Depression    Diverticulosis    Dysuria    Encephalomalacia    GAD (generalized anxiety disorder)    GERD (gastroesophageal reflux disease)    Glaucoma    Hearing loss    Hemorrhoids    Hypertension    Hypothyroidism    Incontinence    Mixed hyperlipidemia    Myocardial infarction [facility])    NSTEMI (  non-ST elevated myocardial infarction) (HCC) 12/19/2011   OSA on CPAP    PAF (paroxysmal atrial fibrillation) (HCC)    Peripheral arterial disease (HCC)    Pneumonitis 04/26/2015   Pyelonephritis due to Escherichia coli 04/28/2015   Sepsis (HCC)    Spondylosis of lumbar region without myelopathy or radiculopathy    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HCC)    Urticaria    Vertigo, benign positional     Past Surgical History:  Procedure Laterality Date   ABDOMINAL AORTOGRAM W/LOWER EXTREMITY N/A 08/22/2017   Procedure: ABDOMINAL AORTOGRAM W/LOWER EXTREMITY;   Surgeon: Serene Gaile ORN, MD;  Location: [facility] INVASIVE CV LAB;  Service: Cardiovascular;  Laterality: N/A;   BILATERAL UPPER EXTREMITY ANGIOGRAM N/A 09/11/2012   Procedure: BILATERAL UPPER EXTREMITY ANGIOGRAM;  Surgeon: Gaile ORN Serene, MD;  Location: [facility] CATH LAB;  Service: Cardiovascular;  Laterality: N/A;   BIOPSY  03/25/2020   Procedure: BIOPSY;  Surgeon: Wilhelmenia Aloha Raddle., MD;  Location: [facility] ENDOSCOPY;  Service: Gastroenterology;;   carotid duplex doppler Bilateral 09/03/2012, 11/03/2011   Evidence of 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 patent left common carotid- subclavian artery bypass with turbulent flow noted at the anastomosis with velocities of 295 cm/s   CAROTID-SUBCLAVIAN BYPASS GRAFT  12/15/2011   Procedure: BYPASS GRAFT CAROTID-SUBCLAVIAN;  Surgeon: Gaile ORN Serene, MD;  Location: [facility] OR;  Service: Vascular;  Laterality: Left;  Left Carotid subclavian bypass   CORONARY ANGIOPLASTY     CORONARY ARTERY BYPASS GRAFT     CORONARY ATHERECTOMY N/A 03/19/2020   Procedure: CORONARY ATHERECTOMY;  Surgeon: Dann Candyce RAMAN, MD;  Location: [facility] INVASIVE CV LAB;  Service: Cardiovascular;  Laterality: N/A;   CORONARY STENT INTERVENTION N/A 03/19/2020   Procedure: CORONARY STENT INTERVENTION;  Surgeon: Dann Candyce RAMAN, MD;  Location: [facility] INVASIVE CV LAB;  Service: Cardiovascular;  Laterality: N/A;   DOPPLER ECHOCARDIOGRAPHY  05/27/2010, 09/17/2008   Mild Proximal septal thickening is noted. Left ventricular systolic functions is normal ejection fraction =>55%. the aortic valve appears to be mildly sclerotic    ENDARTERECTOMY Right 12/13/2023   Procedure: ENDARTERECTOMY, CAROTID;  Surgeon: Serene Gaile ORN, MD;  Location: [facility] OR;  Service: Vascular;  Laterality: Right;   ESOPHAGOGASTRODUODENOSCOPY (EGD) WITH PROPOFOL  N/A 03/25/2020   Procedure:  ESOPHAGOGASTRODUODENOSCOPY (EGD) WITH PROPOFOL ;  Surgeon: Wilhelmenia Aloha Raddle., MD;  Location: [facility] ENDOSCOPY;  Service: Gastroenterology;  Laterality: N/A;   fem-fem bypass graft  1999   holter monitor  01/21/2008   The predominant rhythm was normal sinus rhythm. Minimum heartrate of 63 bpm at +01:00, maximum heartrate of 105 bpm at + 10:35; and the average heartrate of 75 bpm. Ventricular ectopic activity totaled 1270: Multifocal; 866-PVC's and 404-VEs              JOINT REPLACEMENT     Left knee   LEFT HEART CATH AND CORS/GRAFTS ANGIOGRAPHY N/A 03/19/2020   Procedure: LEFT HEART CATH AND CORS/GRAFTS ANGIOGRAPHY;  Surgeon: Dann Candyce RAMAN, MD;  Location: [facility] INVASIVE CV LAB;  Service: Cardiovascular;  Laterality: N/A;   LEFT HEART CATHETERIZATION WITH CORONARY/GRAFT ANGIOGRAM N/A 12/21/2011   Procedure: LEFT HEART CATHETERIZATION WITH EL BILE;  Surgeon: Alm ORN Clay, MD;  Location: [facility] CATH LAB;  Service: Cardiovascular;  Laterality: N/A;   NM MYOCAR PERF EJECTION FRACTION  09/22/2009, 07/03/2007   the post stress myocardial perfusion images show a normal pattern of perfusion is all regions. The post-stress  ejection fraction is 68 %. no significant wall motion abnormalities noted. This is a low risk scan.   PATCH ANGIOPLASTY Right 12/13/2023   Procedure: ANGIOPLASTY, USING PATCH GRAFT;  Surgeon: Serene Gaile ORN, MD;  Location: [facility] OR;  Service: Vascular;  Laterality: Right;   PERIPHERAL VASCULAR INTERVENTION  08/22/2017   Procedure: PERIPHERAL VASCULAR INTERVENTION;  Surgeon: Serene Gaile ORN, MD;  Location: [facility] INVASIVE CV LAB;  Service: Cardiovascular;;  Fem/Fem Graft   REPLACEMENT TOTAL KNEE  05-2011   UNILATERAL UPPER EXTREMEITY ANGIOGRAM Left 11/15/2011   Procedure: UNILATERAL UPPER VENITA BILE;  Surgeon: Dorn JINNY Lesches, MD;  Location: [facility] CATH LAB;  Service: Cardiovascular;  Laterality: Left;    Allergies  Allergen Reactions   Lidocaine  Swelling and Other (See  Comments)    Use CORRECT dose for age/weight- red welts and tongue swelling result, if not   Cymbalta  [Duloxetine  Hcl] Other (See Comments)    Increased confusion and memory concerns    Donepezil Other (See Comments)    Altered mood and Aggression     Mobic [Meloxicam] Other (See Comments)    Heart failure- cannot take   Namenda [Memantine] Other (See Comments)    Severe aggression   Nsaids Other (See Comments)    Cannot have due to heart issues   Other Other (See Comments)    No salt or anything that might cause fluid retention/swelling!!   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   ALPRAZolam  (XANAX ) 0.5 MG tablet Take 1 tablet (0.5 mg total) by mouth daily as needed for up to 2 doses for anxiety.   aspirin  EC 81 MG tablet Take 81 mg by mouth at bedtime.   atorvastatin  (LIPITOR) 40 MG tablet Take 1 tablet (40 mg total) by mouth daily.   busPIRone  (BUSPAR ) 15 MG tablet Take 1 tablet (15 mg total) by mouth 2 (two) times daily.   Cholecalciferol  (VITAMIN D3) 1.25 MG (50000 UT) CAPS Take 1 capsule by mouth once a week.   clopidogrel  (PLAVIX ) 75 MG tablet TAKE 1 TABLET(75 MG) BY MOUTH DAILY (Patient taking differently: Take 75 mg by mouth at bedtime.)   Cyanocobalamin  (VITAMIN B-12 PO) Take 1 tablet by mouth in the morning.   divalproex  (DEPAKOTE  ER) 250 MG 24 hr tablet Take 1 tablet (250 mg total) by mouth at bedtime.   dorzolamide  (TRUSOPT ) 2 % ophthalmic solution Place 1 drop into both eyes in the morning and at bedtime.   escitalopram  (LEXAPRO ) 20 MG tablet Take 1 tablet (20 mg total) by  mouth daily.   ezetimibe  (ZETIA ) 10 MG tablet Take 1 tablet (10 mg total) by mouth daily.   furosemide  (LASIX ) 40 MG tablet Take 1 tablet (40 mg total) by mouth daily as needed for fluid or edema.   ketoconazole  (NIZORAL ) 2 % shampoo APPLY TOPICALLY 2 TIMES A WEEK.   levothyroxine  (SYNTHROID ) 150 MCG tablet Take 1 tablet (150 mcg total) by mouth daily at 6 (six) AM.   meclizine  (ANTIVERT ) 25 MG tablet Take 1 tablet (25 mg total) by mouth as needed for dizziness. (Patient taking differently: Take 25 mg by  mouth 3 (three) times daily as needed for dizziness.)   metoprolol  succinate (TOPROL -XL) 25 MG 24 hr tablet Take 1 tablet (25 mg total) by mouth daily.   nitroGLYCERIN  (NITROSTAT ) 0.4 MG SL tablet Place 1 tablet (0.4 mg total) under the tongue every 5 (five) minutes x 3 doses as needed for chest pain.   pantoprazole  (PROTONIX ) 40 MG tablet Take 1 tablet (40 mg total) by mouth daily.   QUEtiapine  (SEROQUEL ) 25 MG tablet Take 1 tablet (25 mg total) by mouth at bedtime. Would wean as tolerated   TYLENOL  500 MG tablet Take 500-1,000 mg by mouth 2 (two) times daily.   No facility-administered encounter medications on file as of 02/06/2024.    Review of Systems:  Review of Systems  Constitutional:  Negative for appetite change, chills, fatigue and fever.  HENT:  Negative for congestion, hearing loss, rhinorrhea and sore throat.   Eyes: Negative.   Respiratory:  Negative for cough, shortness of breath and wheezing.   Cardiovascular:  Negative for chest pain, palpitations and leg swelling.  Gastrointestinal:  Negative for abdominal pain, constipation, diarrhea, nausea and vomiting.  Genitourinary:  Positive for dysuria.  Musculoskeletal:  Negative for arthralgias, back pain and myalgias.  Skin:  Negative for color change, rash and wound.  Neurological:  Negative for dizziness, weakness and headaches.  Psychiatric/Behavioral:  Negative for behavioral problems. The patient is not nervous/anxious.      Health Maintenance  Topic Date Due   DTaP/Tdap/Td (1 - Tdap) Never done   Zoster Vaccines- Shingrix (2 of 2) 08/17/2016   COVID-19 Vaccine (4 - 2024-25 season) 04/09/2023   Medicare Annual Wellness (AWV)  05/17/2023   INFLUENZA VACCINE  03/08/2024   Pneumococcal Vaccine: 50+ Years  Completed   DEXA SCAN  Completed   Hepatitis B Vaccines  Aged Out   HPV VACCINES  Aged Out   Meningococcal B Vaccine  Aged Out   Colonoscopy  Discontinued   Hepatitis C Screening  Discontinued    Physical Exam: Vitals:   02/06/24 1518  BP: 128/76  Pulse: 93  Resp: 18  Temp: 97.8 F (36.6 C)  SpO2: 91%  Weight: 165 lb 9.6 oz (75.1 kg)  Height: 5' 1.5 (1.562 m)   Body mass index is 30.78 kg/m. Physical Exam Constitutional:      Appearance: Normal appearance.  HENT:     Head: Normocephalic and atraumatic.     Nose: Nose normal.     Mouth/Throat:     Mouth: Mucous membranes are moist.   Eyes:     Conjunctiva/sclera: Conjunctivae normal.    Cardiovascular:     Rate and Rhythm: Normal rate and regular rhythm.  Pulmonary:     Effort: Pulmonary effort is normal.     Breath sounds: Normal breath sounds.  Abdominal:     General: Bowel sounds are normal.     Palpations: Abdomen is soft.   Musculoskeletal:        General: Normal range of motion.     Cervical back: Normal range of motion.   Skin:    General: Skin is warm and dry.   Neurological:     Mental Status: She is alert.     Comments: Left facial droop  Psychiatric:        Mood and Affect: Mood normal.        Behavior: Behavior normal.        Thought Content: Thought content normal.        Judgment: Judgment normal.  Labs reviewed: Basic Metabolic Panel: Recent Labs    06/16/23 1558 08/16/23 1358 08/17/23 0717 09/12/23 1351 12/15/23 2148 12/18/23 0300 12/19/23 0318 12/20/23 0225 12/21/23 0258  NA 139   < > 134*   < > 139 139 139 137 138  K 3.6   < > 3.8   < > 4.3 3.2* 3.6 3.1* 4.1  CL 99   < > 97*    < > 108 97* 97* 95* 98  CO2 26   < > 27   < > 23 31 33* 31 31  GLUCOSE 104   < > 101*   < > 122* 113* 111* 176* 113*  BUN 24   < > 17   < > 15 16 21 22 19   CREATININE 1.45*   < > 1.18*   < > 1.25* 1.31* 1.50* 1.59* 1.54*  CALCIUM  9.4   < > 9.2   < > 9.1 9.2 9.4 9.0 9.1  MG  --   --   --   --   --  1.4*  --   --  1.6*  PHOS  --   --   --   --   --   --   --   --  2.8  TSH 0.41  --  6.574*  --  0.236*  --   --   --   --    < > = values in this interval not displayed.   Liver Function Tests: Recent Labs    12/15/23 2148 12/18/23 1018 12/21/23 0258  AST 48* 23 17  ALT 11 10 12   ALKPHOS 48 49 51  BILITOT 1.2 1.7* 0.8  PROT 5.8* 5.6* 5.7*  ALBUMIN  3.2* 3.0* 3.2*   No results for input(s): LIPASE, AMYLASE in the last 8760 hours. Recent Labs    12/18/23 1018  AMMONIA 39*   CBC: Recent Labs    09/12/23 1351 11/28/23 0843 12/15/23 2148 12/17/23 0235 12/19/23 0318 12/20/23 0225 12/21/23 0258  WBC 4.2   < > 7.7   < > 7.4 5.6 6.4  NEUTROABS 1,659  --  5.5  --   --   --  3.0  HGB 14.0   < > 11.5*   < > 12.3 12.4 12.0  HCT 42.9   < > 37.4   < > 37.5 38.3 37.4  MCV 87.0   < > 92.6   < > 85.2 84.7 86.4  PLT 155   < > 120*   < > 214 202 193   < > = values in this interval not displayed.   Lipid Panel: Recent Labs    06/16/23 1558 12/14/23 0424  CHOL 98 73  HDL 32* 22*  LDLCALC 40 29  TRIG 187* 109  CHOLHDL 3.1 3.3   Lab Results  Component Value Date   HGBA1C 5.3 12/18/2023    Procedures since last visit: No results found.  Assessment/Plan  1. Dysuria (Primary) -  with behavioral changes, dysuria, darker urine. Urinalysis negative for nitrate and WBC, positive for protein. Urine culture ordered. - Order urine culture. - Advise to monitor symptoms and report any worsening. - POCT Urinalysis Dip Manual  - Urine Culture  2. History of CVA (cerebrovascular accident) -   left facial droop, walker use - Continue dual antiplatelet therapy with aspirin  and  Plavix . - Continue home health physical therapy. - Ambulatory referral to Home Health for Aide -  follow up with neurology - CBC with Differential/Platelets  3.  PAF (paroxysmal atrial fibrillation) (HCC) -  rate-controlled -  managed with metoprolol  and dual antiplatelet therapy. - Continue metoprolol  for rate control. - Continue dual antiplatelet therapy with aspirin  and Plavix .  4. Acquired hypothyroidism Lab Results  Component Value Date   TSH 0.236 (L) 12/15/2023    -  Hypothyroidism managed with levothyroxine  150 mcg daily.  - Repeat TSH to assess current thyroid  function. - TSH  5. Essential hypertension -  BP 128/76, stable - Continue Toprol -XL 25 mg daily  6. Chronic combined systolic (congestive) and diastolic (congestive) heart failure (HCC) -Repeat TTE on 5/11 with EF 40 to 45% - Diuresis on hold with GFR 34 -   Follow-up with cardiology  7. Stage 3b chronic kidney disease (HCC) Lab Results  Component Value Date   NA 138 12/21/2023   K 4.1 12/21/2023   CO2 31 12/21/2023   GLUCOSE 113 (H) 12/21/2023   BUN 19 12/21/2023   CREATININE 1.54 (H) 12/21/2023   CALCIUM  9.1 12/21/2023   EGFR 36 (L) 09/12/2023   GFRNONAA 34 (L) 12/21/2023   -  - Monitor renal function. - Continue current medications with awareness of renal function impact. - Comprehensive metabolic panel  8.  Anxiety and depression -   Managed with Lexapro , Xanax , Buspar , Depakote . -  per daughter, patient is taking Seroquel  12.5 mg at bedtime and witll discontinue in 2 weeks  9. 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  Managed with lifestyle changes and Lasix  as needed. - Monitor weight and fluid intake. - Use Lasix  as needed for fluid retention.  Chronic Kidney Disease Stage 3B Stage 3B CKD with GFR 34. Monitoring renal function and medication adjustments necessary.  Hypertension Hypertension well-controlled with metoprolol  succinate 25 mg daily. - Continue metoprolol  succinate 25 mg daily.  Hypothyroidism  Dementia Dementia with behavioral changes, anxiety, depression. - Continue current psychiatric medications. - Monitor for behavioral changes.  Activities of Daily Living Assistance Need for home health aid due to caregiver burden discussed. - Order home health aid to assist with ADLs.  Follow-up Follow-up needed for stroke, UTI, medication adjustments. - Schedule follow-up appointment in one month. - Monitor urine culture results and adjust treatment as necessary.        Labs/tests ordered:   CBC, BMP, tsh, urine dipstick and urine culture   Return in about 4 weeks (around 03/05/2024).  Deanna Orman Medina-Vargas, NP

## 2024-02-07 ENCOUNTER — Ambulatory Visit: Payer: Self-pay | Admitting: Adult Health

## 2024-02-07 DIAGNOSIS — R300 Dysuria: Secondary | ICD-10-CM

## 2024-02-07 LAB — COMPREHENSIVE METABOLIC PANEL WITH GFR
AG Ratio: 2.1 (calc) (ref 1.0–2.5)
ALT: 8 U/L (ref 6–29)
AST: 13 U/L (ref 10–35)
Albumin: 4.7 g/dL (ref 3.6–5.1)
Alkaline phosphatase (APISO): 75 U/L (ref 37–153)
BUN/Creatinine Ratio: 14 (calc) (ref 6–22)
BUN: 20 mg/dL (ref 7–25)
CO2: 26 mmol/L (ref 20–32)
Calcium: 10.1 mg/dL (ref 8.6–10.4)
Chloride: 103 mmol/L (ref 98–110)
Creat: 1.43 mg/dL — ABNORMAL HIGH (ref 0.60–0.95)
Globulin: 2.2 g/dL (ref 1.9–3.7)
Glucose, Bld: 97 mg/dL (ref 65–99)
Potassium: 4.1 mmol/L (ref 3.5–5.3)
Sodium: 141 mmol/L (ref 135–146)
Total Bilirubin: 1.6 mg/dL — ABNORMAL HIGH (ref 0.2–1.2)
Total Protein: 6.9 g/dL (ref 6.1–8.1)
eGFR: 37 mL/min/{1.73_m2} — ABNORMAL LOW (ref >=60)

## 2024-02-07 LAB — URINE CULTURE
MICRO NUMBER:: 16647983
SPECIMEN QUALITY:: ADEQUATE

## 2024-02-07 LAB — TSH: TSH: 1.71 m[IU]/L (ref 0.40–4.50)

## 2024-02-07 LAB — CBC WITH DIFFERENTIAL/PLATELET
Absolute Lymphocytes: 1727 {cells}/uL (ref 850–3900)
Absolute Monocytes: 408 {cells}/uL (ref 200–950)
Basophils Absolute: 27 {cells}/uL (ref 0–200)
Basophils Relative: 0.4 %
Eosinophils Absolute: 68 {cells}/uL (ref 15–500)
Eosinophils Relative: 1 %
HCT: 43.5 % (ref 35.0–45.0)
Hemoglobin: 13.7 g/dL (ref 11.7–15.5)
MCH: 27.6 pg (ref 27.0–33.0)
MCHC: 31.5 g/dL — ABNORMAL LOW (ref 32.0–36.0)
MCV: 87.5 fL (ref 80.0–100.0)
MPV: 10.1 fL (ref 7.5–12.5)
Monocytes Relative: 6 %
Neutro Abs: 4570 {cells}/uL (ref 1500–7800)
Neutrophils Relative %: 67.2 %
Platelets: 170 Thousand/uL (ref 140–400)
RBC: 4.97 Million/uL (ref 3.80–5.10)
RDW: 13.8 % (ref 11.0–15.0)
Total Lymphocyte: 25.4 %
WBC: 6.8 10*3/uL (ref 3.8–10.8)

## 2024-02-07 NOTE — Progress Notes (Signed)
-     No anemia, WBC not elevated - GFR 37, ranging as chronic kidney disease stage IIIb -  TSH normal -   Urine culture has mixed flora, possible contamination during urine collection -  if still having symptoms , will need to recollect urine and let us  know if needing order to be put in

## 2024-02-08 ENCOUNTER — Other Ambulatory Visit

## 2024-02-08 DIAGNOSIS — Z951 Presence of aortocoronary bypass graft: Secondary | ICD-10-CM | POA: Diagnosis not present

## 2024-02-08 DIAGNOSIS — G4733 Obstructive sleep apnea (adult) (pediatric): Secondary | ICD-10-CM | POA: Diagnosis not present

## 2024-02-08 DIAGNOSIS — N184 Chronic kidney disease, stage 4 (severe): Secondary | ICD-10-CM | POA: Diagnosis not present

## 2024-02-08 DIAGNOSIS — E039 Hypothyroidism, unspecified: Secondary | ICD-10-CM | POA: Diagnosis not present

## 2024-02-08 DIAGNOSIS — E782 Mixed hyperlipidemia: Secondary | ICD-10-CM | POA: Diagnosis not present

## 2024-02-08 DIAGNOSIS — Z48812 Encounter for surgical aftercare following surgery on the circulatory system: Secondary | ICD-10-CM | POA: Diagnosis not present

## 2024-02-08 DIAGNOSIS — I5042 Chronic combined systolic (congestive) and diastolic (congestive) heart failure: Secondary | ICD-10-CM | POA: Diagnosis not present

## 2024-02-08 DIAGNOSIS — I25708 Atherosclerosis of coronary artery bypass graft(s), unspecified, with other forms of angina pectoris: Secondary | ICD-10-CM | POA: Diagnosis not present

## 2024-02-08 DIAGNOSIS — Z955 Presence of coronary angioplasty implant and graft: Secondary | ICD-10-CM | POA: Diagnosis not present

## 2024-02-08 DIAGNOSIS — I774 Celiac artery compression syndrome: Secondary | ICD-10-CM | POA: Diagnosis not present

## 2024-02-08 DIAGNOSIS — I130 Hypertensive heart and chronic kidney disease with heart failure and stage 1 through stage 4 chronic kidney disease, or unspecified chronic kidney disease: Secondary | ICD-10-CM | POA: Diagnosis not present

## 2024-02-08 DIAGNOSIS — Z87440 Personal history of urinary (tract) infections: Secondary | ICD-10-CM | POA: Diagnosis not present

## 2024-02-08 DIAGNOSIS — Z556 Problems related to health literacy: Secondary | ICD-10-CM | POA: Diagnosis not present

## 2024-02-08 DIAGNOSIS — I480 Paroxysmal atrial fibrillation: Secondary | ICD-10-CM | POA: Diagnosis not present

## 2024-02-08 DIAGNOSIS — M19012 Primary osteoarthritis, left shoulder: Secondary | ICD-10-CM | POA: Diagnosis not present

## 2024-02-08 DIAGNOSIS — I739 Peripheral vascular disease, unspecified: Secondary | ICD-10-CM | POA: Diagnosis not present

## 2024-02-08 DIAGNOSIS — M19011 Primary osteoarthritis, right shoulder: Secondary | ICD-10-CM | POA: Diagnosis not present

## 2024-02-08 DIAGNOSIS — I69392 Facial weakness following cerebral infarction: Secondary | ICD-10-CM | POA: Diagnosis not present

## 2024-02-08 LAB — POCT URINALYSIS DIPSTICK (MANUAL)
Leukocytes, UA: NEGATIVE
Nitrite, UA: NEGATIVE
Poct Bilirubin: NEGATIVE
Poct Blood: NEGATIVE
Poct Glucose: NORMAL mg/dL
Poct Ketones: NEGATIVE
Poct Protein: 30 mg/dL — AB
Poct Urobilinogen: NORMAL mg/dL
Spec Grav, UA: 1.02 (ref 1.010–1.025)
pH, UA: 6 (ref 5.0–8.0)

## 2024-02-27 ENCOUNTER — Ambulatory Visit: Admitting: Orthopedic Surgery

## 2024-02-27 DIAGNOSIS — G8929 Other chronic pain: Secondary | ICD-10-CM | POA: Diagnosis not present

## 2024-02-27 DIAGNOSIS — M25512 Pain in left shoulder: Secondary | ICD-10-CM | POA: Diagnosis not present

## 2024-02-27 DIAGNOSIS — M7542 Impingement syndrome of left shoulder: Secondary | ICD-10-CM | POA: Diagnosis not present

## 2024-02-27 DIAGNOSIS — M542 Cervicalgia: Secondary | ICD-10-CM

## 2024-03-04 ENCOUNTER — Encounter: Admitting: Adult Health

## 2024-03-04 DIAGNOSIS — F411 Generalized anxiety disorder: Secondary | ICD-10-CM

## 2024-03-04 DIAGNOSIS — K219 Gastro-esophageal reflux disease without esophagitis: Secondary | ICD-10-CM

## 2024-03-04 DIAGNOSIS — E781 Pure hyperglyceridemia: Secondary | ICD-10-CM

## 2024-03-04 DIAGNOSIS — E039 Hypothyroidism, unspecified: Secondary | ICD-10-CM

## 2024-03-04 DIAGNOSIS — Z8673 Personal history of transient ischemic attack (TIA), and cerebral infarction without residual deficits: Secondary | ICD-10-CM

## 2024-03-04 DIAGNOSIS — I10 Essential (primary) hypertension: Secondary | ICD-10-CM

## 2024-03-04 DIAGNOSIS — F03918 Unspecified dementia, unspecified severity, with other behavioral disturbance: Secondary | ICD-10-CM

## 2024-03-04 DIAGNOSIS — F339 Major depressive disorder, recurrent, unspecified: Secondary | ICD-10-CM

## 2024-03-04 NOTE — Progress Notes (Signed)
 This encounter was created in error - please disregard.

## 2024-03-07 ENCOUNTER — Ambulatory Visit: Admitting: Orthopedic Surgery

## 2024-03-13 ENCOUNTER — Ambulatory Visit (INDEPENDENT_AMBULATORY_CARE_PROVIDER_SITE_OTHER): Admitting: Orthopedic Surgery

## 2024-03-13 ENCOUNTER — Other Ambulatory Visit: Payer: Self-pay

## 2024-03-13 ENCOUNTER — Encounter: Payer: Self-pay | Admitting: Orthopedic Surgery

## 2024-03-13 DIAGNOSIS — G8929 Other chronic pain: Secondary | ICD-10-CM

## 2024-03-13 DIAGNOSIS — M25512 Pain in left shoulder: Secondary | ICD-10-CM | POA: Diagnosis not present

## 2024-03-13 DIAGNOSIS — M19012 Primary osteoarthritis, left shoulder: Secondary | ICD-10-CM

## 2024-03-13 NOTE — Progress Notes (Signed)
 Office Visit Note   Patient: Deanna [name]           Date of Birth: [birth year]           MRN: [id number] Visit Date: 02/27/2024              Requested by: Caro [name] POUR, NP [address] [place]. [place],  [place] [address] PCP: Caro [name] POUR, NP  Chief Complaint  Patient presents with   Left Shoulder - Pain      HPI: Patient is an 83 year old woman who is seen for initial evaluation for left shoulder pain.  She states she saw Dr. Barbarann in January 2025.  Patient states she had an injection in the shoulder at that time that did not help.  Patient states she has constant pain and was inquiring regarding pain management.  Patient states she has been hospitalized twice since her evaluation with Dr. Barbarann.  Patient states she has had carotid bypass as well as a small stroke.  She states she cannot do much with physical therapy.  Patient states she cannot do housework.  Assessment & Plan: Visit Diagnoses:  1. Chronic left shoulder pain   2. Impingement syndrome of left shoulder   3. Neck pain     Plan: Will have patient follow-up with Dr. Addie for evaluation for intra-articular injection and possible surgical intervention left shoulder.  Follow-Up Instructions: Return if symptoms worsen or fail to improve.   Ortho Exam  Patient is alert, oriented, no adenopathy, well-dressed, normal affect, normal respiratory effort. Examination patient has tenderness to palpation of the paracervical muscles.  Patient has abduction of the left shoulder to 70 degrees internal and external rotation of 20 degrees.  There is mechanical symptoms with range of motion.  Radiographs of the left shoulder shows glenohumeral arthritis with an inferior loose body.    Imaging: No results found. No images are attached to the encounter.  Labs: Lab Results  Component Value Date   HGBA1C 5.3 12/18/2023   HGBA1C 5.8 (H) 03/18/2020   HGBA1C 5.6 01/11/2019   CRP 1.4 (H) 06/18/2022   REPTSTATUS  12/16/2023 FINAL 12/11/2023   GRAMSTAIN  12/23/2011    MODERATE WBC PRESENT,BOTH PMN AND MONONUCLEAR NO ORGANISMS SEEN   GRAMSTAIN  12/23/2011    MODERATE WBC PRESENT,BOTH PMN AND MONONUCLEAR NO ORGANISMS SEEN Performed at [facility]   GRAMSTAIN  12/23/2011    MODERATE WBC PRESENT,BOTH PMN AND MONONUCLEAR NO ORGANISMS SEEN   CULT  12/11/2023    NO GROWTH 5 DAYS Performed at [facility], [address] [place], [place] [address]    LABORGA ESCHERICHIA COLI (A) 08/16/2023     Lab Results  Component Value Date   ALBUMIN  3.2 (L) 12/21/2023   ALBUMIN  3.0 (L) 12/18/2023   ALBUMIN  3.2 (L) 12/15/2023    Lab Results  Component Value Date   MG 1.6 (L) 12/21/2023   MG 1.4 (L) 12/18/2023   MG 1.8 05/09/2021   Lab Results  Component Value Date   VD25OH 32.36 08/17/2023   VD25OH 49.4 01/12/2022    No results found for: PREALBUMIN    Latest Ref Rng & Units 02/06/2024    3:44 PM 12/21/2023    2:58 AM 12/20/2023    2:25 AM  CBC EXTENDED  WBC 3.8 - 10.8 Thousand/uL 6.8  6.4  5.6   RBC 3.80 - 5.10 Million/uL 4.97  4.33  4.52   Hemoglobin 11.7 - 15.5 g/dL 86.2  12.0  12.4   HCT 35.0 - 45.0 % 43.5  37.4  38.3   Platelets 140 - 400 Thousand/uL 170  193  202   NEUT# 1,500 - 7,800 cells/uL 4,570  3.0    Lymph# 0.7 - 4.0 K/uL  2.4       There is no height or weight on file to calculate BMI.  Orders:  No orders of the defined types were placed in this encounter.  No orders of the defined types were placed in this encounter.    Procedures: No procedures performed  Clinical Data: No additional findings.  ROS:  All other systems negative, except as noted in the HPI. Review of Systems  Objective: Vital Signs: There were no vitals taken for this visit.  Specialty Comments:  No specialty comments available.  PMFS History: Patient Active Problem List   Diagnosis Date Noted   Acute CVA (cerebrovascular accident) (HCC) 12/20/2023   Hypoxia 12/16/2023    Carotid artery stenosis, asymptomatic, right 12/13/2023   Left arm pain 08/17/2023   Delirium 08/17/2023   UTI (urinary tract infection) 08/16/2023   Anxiety 04/24/2023   Acute respiratory failure with hypoxia (HCC) 06/17/2022   COVID-19 virus infection 06/17/2022   Stage 3b chronic kidney disease (CKD) (HCC) - baseline SCr 1.8 06/17/2022   PAD (peripheral artery disease) (HCC) 10/11/2021   PAF (paroxysmal atrial fibrillation) (HCC) 10/11/2021   Hypokalemia 05/09/2021   Status post coronary artery stent placement    Overactive bladder 12/16/2019   Generalized anxiety disorder 06/17/2018   Mood disorder (HCC) 06/17/2018   Chronic diastolic CHF (congestive heart failure) (HCC) 11/16/2017   Primary open angle glaucoma of both eyes, moderate stage 11/09/2017   Risk for falls 11/22/2016   Hearing disorder 11/03/2016   Spondylosis of lumbar region without myelopathy or radiculopathy 02/25/2016   History of traumatic brain injury 11/04/2015   Primary osteoarthritis of both hips 08/17/2015   Cognitive impairment 07/08/2015   OSA on CPAP 04/26/2015   Chronic pain 04/26/2015   Physical deconditioning 04/26/2015   Hyperlipidemia 07/02/2014   Carotid artery stenosis 03/26/2012   Shortness of breath 12/19/2011    Class: Acute   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, 2011 11/28/2011   History of colonic polyps 06/18/2010   Hypothyroidism 11/17/2008   Obesity, Class III, BMI 40-49.9 (morbid obesity) (HCC) 11/17/2008   ANXIETY DEPRESSION 11/17/2008   Essential hypertension 11/17/2008   HEMORRHOIDS 11/17/2008   GERD 11/17/2008   IBS 11/17/2008   Atherosclerosis of coronary artery bypass graft(s), unspecified, with other forms of angina pectoris (HCC)    Past Medical History:  Diagnosis Date   Anxiety    Bacteremia, escherichia coli 04/27/2015   Brachial-basilar insufficiency syndrome    CAD (coronary artery disease)    Celiac artery stenosis (HCC)    Chronic bilateral low back  pain without sciatica    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   CKD (chronic kidney disease), stage IV (HCC)    Cognitive impairment    Colon polyps    Community acquired pneumonia    Depression    Diverticulosis    Dysuria    Encephalomalacia    GAD (generalized anxiety disorder)    GERD (gastroesophageal reflux disease)    Glaucoma    Hearing loss    Hemorrhoids    Hypertension    Hypothyroidism    Incontinence    Mixed hyperlipidemia    Myocardial infarction [facility])    NSTEMI (non-ST elevated myocardial  infarction) (HCC) 12/19/2011   OSA on CPAP    PAF (paroxysmal atrial fibrillation) (HCC)    Peripheral arterial disease (HCC)    Pneumonitis 04/26/2015   Pyelonephritis due to Escherichia coli 04/28/2015   Sepsis (HCC)    Spondylosis of lumbar region without myelopathy or radiculopathy    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HCC)    Urticaria    Vertigo, benign positional     Family History  Problem Relation Age of Onset   Heart attack Mother    Heart disease Mother        before age 32   Diabetes Father    Heart disease Father    Hypertension Father    Hyperlipidemia Father    Heart attack Father 31   Heart attack Brother 62   Cerebral palsy Sister 16   Congestive Heart Failure Sister 80   Heart attack Sister 37   Hypertension Sister 80   Dementia Sister    Anxiety disorder Sister    Anxiety disorder Sister 67   Heart Problems Sister    Stroke Sister    Heart Problems Sister 69   Heart attack Sister 83   Colon cancer Brother 64   Prostate cancer Brother 10   Hypertension Daughter    Irritable bowel syndrome Daughter    Depression Daughter    Anxiety disorder Daughter     Past Surgical History:  Procedure Laterality Date   ABDOMINAL AORTOGRAM W/LOWER EXTREMITY N/A 08/22/2017   Procedure: ABDOMINAL AORTOGRAM W/LOWER EXTREMITY;  Surgeon: Serene Gaile ORN, MD;  [facility]: [facility] INVASIVE CV LAB;  Service:  Cardiovascular;  Laterality: N/A;   BILATERAL UPPER EXTREMITY ANGIOGRAM N/A 09/11/2012   Procedure: BILATERAL UPPER EXTREMITY ANGIOGRAM;  Surgeon: Gaile ORN Serene, MD;  Location: [facility] CATH LAB;  Service: Cardiovascular;  Laterality: N/A;   BIOPSY  03/25/2020   Procedure: BIOPSY;  Surgeon: Wilhelmenia Aloha Raddle., MD;  Location: [facility] ENDOSCOPY;  Service: Gastroenterology;;   carotid duplex doppler Bilateral 09/03/2012, 11/03/2011   Evidence of 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 patent left common carotid- subclavian artery bypass with turbulent flow noted at the anastomosis with velocities of 295 cm/s   CAROTID-SUBCLAVIAN BYPASS GRAFT  12/15/2011   Procedure: BYPASS GRAFT CAROTID-SUBCLAVIAN;  Surgeon: Gaile ORN Serene, MD;  Location: [facility] OR;  Service: Vascular;  Laterality: Left;  Left Carotid subclavian bypass   CORONARY ANGIOPLASTY     CORONARY ARTERY BYPASS GRAFT     CORONARY ATHERECTOMY N/A 03/19/2020   Procedure: CORONARY ATHERECTOMY;  Surgeon: Dann Candyce RAMAN, MD;  Location: [facility] INVASIVE CV LAB;  Service: Cardiovascular;  Laterality: N/A;   CORONARY STENT INTERVENTION N/A 03/19/2020   Procedure: CORONARY STENT INTERVENTION;  Surgeon: Dann Candyce RAMAN, MD;  Location: [facility] INVASIVE CV LAB;  Service: Cardiovascular;  Laterality: N/A;   DOPPLER ECHOCARDIOGRAPHY  05/27/2010, 09/17/2008   Mild Proximal septal thickening is noted. Left ventricular systolic functions is normal ejection fraction =>55%. the aortic valve appears to be mildly sclerotic    ENDARTERECTOMY Right 12/13/2023   Procedure: ENDARTERECTOMY, CAROTID;  Surgeon: Serene Gaile ORN, MD;  Location: [facility] OR;  Service: Vascular;  Laterality: Right;   ESOPHAGOGASTRODUODENOSCOPY (EGD) WITH PROPOFOL  N/A 03/25/2020   Procedure: ESOPHAGOGASTRODUODENOSCOPY (EGD) WITH PROPOFOL ;  Surgeon: Wilhelmenia Aloha Raddle., MD;  Location: [facility] ENDOSCOPY;  Service: Gastroenterology;  Laterality: N/A;   fem-fem bypass graft  1999   holter monitor  01/21/2008  The predominant rhythm was normal sinus rhythm. Minimum heartrate of 63 bpm at +01:00, maximum heartrate of 105 bpm at + 10:35; and the average heartrate of 75 bpm. Ventricular ectopic activity totaled 1270: Multifocal; 866-PVC's and 404-VEs              JOINT REPLACEMENT     Left knee   LEFT HEART CATH AND CORS/GRAFTS ANGIOGRAPHY N/A 03/19/2020   Procedure: LEFT HEART CATH AND CORS/GRAFTS ANGIOGRAPHY;  Surgeon: Dann Candyce RAMAN, MD;  Location: [facility]VASIVE CV LAB;  Service: Cardiovascular;  Laterality: N/A;   LEFT HEART CATHETERIZATION WITH CORONARY/GRAFT ANGIOGRAM N/A 12/21/2011   Procedure: LEFT HEART CATHETERIZATION WITH EL BILE;  Surgeon: Alm LELON Clay, MD;  Location: [facility] CATH LAB;  Service: Cardiovascular;  Laterality: N/A;   NM MYOCAR PERF EJECTION FRACTION  09/22/2009, 07/03/2007   the post stress myocardial perfusion images show a normal pattern of perfusion is all regions. The post-stress ejection fraction is 68 %. no significant wall motion abnormalities noted. This is a low risk scan.   PATCH ANGIOPLASTY Right 12/13/2023   Procedure: ANGIOPLASTY, USING PATCH GRAFT;  Surgeon: Serene Gaile LELON, MD;  Location: [facility] OR;  Service: Vascular;  Laterality: Right;   PERIPHERAL VASCULAR INTERVENTION  08/22/2017   Procedure: PERIPHERAL VASCULAR INTERVENTION;  Surgeon: Serene Gaile LELON, MD;  Location: [facility] INVASIVE CV LAB;  Service: Cardiovascular;;  Fem/Fem Graft   REPLACEMENT TOTAL KNEE  05-2011   UNILATERAL UPPER EXTREMEITY ANGIOGRAM Left 11/15/2011   Procedure: UNILATERAL UPPER VENITA BILE;  Surgeon: Dorn JINNY Lesches, MD;  Location: [facility] CATH LAB;  Service: Cardiovascular;  Laterality: Left;   Social History   Occupational History   Not on file  Tobacco Use   Smoking status: Former    Current packs/day: 0.00    Types: Cigarettes     Start date: 11/28/1978    Quit date: 11/27/1981    Years since quitting: 42.3   Smokeless tobacco: Never  Vaping Use   Vaping status: Never Used  Substance and Sexual Activity   Alcohol use: Not Currently    Alcohol/week: 0.0 standard drinks of alcohol   Drug use: No   Sexual activity: Not Currently    Comment: 1st intercourse 23 yo-1 partner
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
## 2024-03-14 ENCOUNTER — Other Ambulatory Visit: Payer: Self-pay | Admitting: Nurse Practitioner

## 2024-03-14 DIAGNOSIS — E785 Hyperlipidemia, unspecified: Secondary | ICD-10-CM

## 2024-03-16 ENCOUNTER — Encounter: Payer: Self-pay | Admitting: Orthopedic Surgery

## 2024-03-16 MED ORDER — TRIAMCINOLONE ACETONIDE 40 MG/ML IJ SUSP
40.0000 mg | INTRAMUSCULAR | Status: AC | PRN
  Administered 2024-03-13: 40 mg via INTRA_ARTICULAR

## 2024-03-16 MED ORDER — BUPIVACAINE HCL 0.5 % IJ SOLN
9.0000 mL | INTRAMUSCULAR | Status: AC | PRN
  Administered 2024-03-13: 9 mL via INTRA_ARTICULAR

## 2024-03-16 MED ORDER — LIDOCAINE HCL 1 % IJ SOLN
5.0000 mL | INTRAMUSCULAR | Status: AC | PRN
  Administered 2024-03-13: 5 mL

## 2024-03-20 ENCOUNTER — Encounter: Admitting: Adult Health

## 2024-03-20 NOTE — Progress Notes (Unsigned)
 This encounter was created in error - please disregard.

## 2024-03-25 ENCOUNTER — Encounter: Admitting: Nurse Practitioner

## 2024-03-25 ENCOUNTER — Encounter: Payer: Self-pay | Admitting: Nurse Practitioner

## 2024-03-25 NOTE — Patient Instructions (Signed)
 1.) Stop at check out to schedule an annual wellness visit  2.) Visit your local pharmacy to receive your covid booster, shingles and tetanus, if you believe you are already up-to-date, review your personal records and share this information with us  at your next appointment

## 2024-03-25 NOTE — Progress Notes (Signed)
 This encounter was created in error - please disregard.

## 2024-03-29 ENCOUNTER — Encounter (HOSPITAL_COMMUNITY): Payer: Self-pay | Admitting: *Deleted

## 2024-03-29 ENCOUNTER — Emergency Department (HOSPITAL_COMMUNITY)

## 2024-03-29 ENCOUNTER — Other Ambulatory Visit: Payer: Self-pay

## 2024-03-29 ENCOUNTER — Ambulatory Visit: Payer: Self-pay

## 2024-03-29 ENCOUNTER — Emergency Department (HOSPITAL_COMMUNITY)
Admission: EM | Admit: 2024-03-29 | Discharge: 2024-03-29 | Disposition: A | Discharge status: Home / Self Care | Attending: Emergency Medicine | Admitting: Emergency Medicine

## 2024-03-29 DIAGNOSIS — Z7982 Long term (current) use of aspirin: Secondary | ICD-10-CM | POA: Diagnosis not present

## 2024-03-29 DIAGNOSIS — R404 Transient alteration of awareness: Secondary | ICD-10-CM | POA: Insufficient documentation

## 2024-03-29 DIAGNOSIS — G9389 Other specified disorders of brain: Secondary | ICD-10-CM | POA: Diagnosis not present

## 2024-03-29 DIAGNOSIS — R4182 Altered mental status, unspecified: Secondary | ICD-10-CM | POA: Diagnosis present

## 2024-03-29 DIAGNOSIS — Z7902 Long term (current) use of antithrombotics/antiplatelets: Secondary | ICD-10-CM | POA: Insufficient documentation

## 2024-03-29 DIAGNOSIS — R410 Disorientation, unspecified: Secondary | ICD-10-CM | POA: Diagnosis not present

## 2024-03-29 DIAGNOSIS — R7989 Other specified abnormal findings of blood chemistry: Secondary | ICD-10-CM | POA: Diagnosis not present

## 2024-03-29 LAB — COMPREHENSIVE METABOLIC PANEL WITH GFR
ALT: 22 U/L (ref 0–44)
AST: 22 U/L (ref 15–41)
Albumin: 3.7 g/dL (ref 3.5–5.0)
Alkaline Phosphatase: 71 U/L (ref 38–126)
Anion gap: 12 (ref 5–15)
BUN: 21 mg/dL (ref 8–23)
CO2: 22 mmol/L (ref 22–32)
Calcium: 9.6 mg/dL (ref 8.9–10.3)
Chloride: 104 mmol/L (ref 98–111)
Creatinine, Ser: 1.42 mg/dL — ABNORMAL HIGH (ref 0.44–1.00)
GFR, Estimated: 37 mL/min — ABNORMAL LOW (ref >60)
Glucose, Bld: 157 mg/dL — ABNORMAL HIGH (ref 70–99)
Potassium: 4.1 mmol/L (ref 3.5–5.1)
Sodium: 138 mmol/L (ref 135–145)
Total Bilirubin: 1.4 mg/dL — ABNORMAL HIGH (ref 0.0–1.2)
Total Protein: 6.4 g/dL — ABNORMAL LOW (ref 6.5–8.1)

## 2024-03-29 LAB — URINALYSIS, ROUTINE W REFLEX MICROSCOPIC
Bilirubin Urine: NEGATIVE
Glucose, UA: NEGATIVE mg/dL
Hgb urine dipstick: NEGATIVE
Ketones, ur: NEGATIVE mg/dL
Leukocytes,Ua: NEGATIVE
Nitrite: NEGATIVE
Protein, ur: 30 mg/dL — AB
Specific Gravity, Urine: 1.012 (ref 1.005–1.030)
pH: 5 (ref 5.0–8.0)

## 2024-03-29 LAB — CBC
HCT: 45.4 % (ref 36.0–46.0)
Hemoglobin: 14.4 g/dL (ref 12.0–15.0)
MCH: 28.4 pg (ref 26.0–34.0)
MCHC: 31.7 g/dL (ref 30.0–36.0)
MCV: 89.5 fL (ref 80.0–100.0)
Platelets: 153 K/uL (ref 150–400)
RBC: 5.07 MIL/uL (ref 3.87–5.11)
RDW: 14.8 % (ref 11.5–15.5)
WBC: 8.9 K/uL (ref 4.0–10.5)
nRBC: 0 % (ref 0.0–0.2)

## 2024-03-29 LAB — LIPASE, BLOOD: Lipase: 36 U/L (ref 11–51)

## 2024-03-29 MED ORDER — SODIUM CHLORIDE 0.9 % IV BOLUS: 1000.0000 mL | Freq: Once | INTRAVENOUS | Status: DC

## 2024-03-29 NOTE — ED Triage Notes (Signed)
 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

## 2024-03-29 NOTE — ED Notes (Signed)
 Patient transported to CT

## 2024-03-29 NOTE — Telephone Encounter (Signed)
 Message routed to PCP Caro, [name] POUR, NP . PCP is aware of patient status and recommended ER/Urgent care visit for immediate evaluation. Patient had appointment Monday 03/25/2024 and didn't come. We have no availability today for patient and she has no showed numerous times.

## 2024-03-29 NOTE — Discharge Instructions (Signed)
 As we discussed, all your labs and imaging are reassuring today.  This is great news.  I have sent your urine off for culture.  If it grows out any bacteria you will be contacted and we can place you on some antibiotics.  Otherwise, I would like for you to follow-up with your primary care doctor for further evaluation.  Please drink plenty of fluids get plenty of rest.  You may return to the emergency department for any worsening symptoms.

## 2024-03-29 NOTE — ED Provider Notes (Signed)
 [place] [facility] Provider Note   CSN: [id number] Arrival date & time: 03/29/24  1553     Patient presents with: Altered Mental Status   Deanna [name] is a 83 y.o. female patient presents to the emerged apartment today for further evaluation of altered mental status.  Daughter is at bedside provides most the history.  She states that patient suffers from frequent urinary tract infections and she has some altered mental status with some increased irritability.  She states that the patient has been accusing her of irrational things and getting angry with her out of the blue which is abnormal for her.  Patient denies any complaints.  Daughter states that she was complaining earlier of some vaginal burning without any other vaginal symptoms.     Altered Mental Status      Prior to Admission medications   Medication Sig Start Date End Date Taking? Authorizing Provider  albuterol  (VENTOLIN  HFA) 108 (90 Base) MCG/ACT inhaler Inhale 2 puffs into the lungs every 6 (six) hours as needed for wheezing or shortness of breath. 09/12/23   Sherlynn Madden, MD  ALPRAZolam  (XANAX ) 0.5 MG tablet Take 1 tablet (0.5 mg total) by mouth daily as needed for up to 2 doses for anxiety. 12/24/23   Perri DELENA Meliton Mickey., MD  aspirin  EC 81 MG tablet Take 81 mg by mouth at bedtime.    [provider]  atorvastatin  (LIPITOR) 40 MG tablet Take 1 tablet (40 mg total) by mouth daily. 12/24/23 12/23/24  Perri DELENA Meliton Mickey., MD  busPIRone  (BUSPAR ) 15 MG tablet Take 1 tablet (15 mg total) by mouth 2 (two) times daily. 11/16/22 08/08/48  Franchot [name] SQUIBB, PMHNP  Cholecalciferol  (VITAMIN D3) 1.25 MG (50000 UT) CAPS Take 1 capsule by mouth once a week. 10/26/23   [provider]  clopidogrel  (PLAVIX ) 75 MG tablet TAKE 1 TABLET(75 MG) BY MOUTH DAILY Patient taking differently: Take 75 mg by mouth at bedtime. 02/27/23   Eubanks, Jessica K, NP  Cyanocobalamin  (VITAMIN  B-12 PO) Take 1 tablet by mouth in the morning.    [provider]  divalproex  (DEPAKOTE  ER) 250 MG 24 hr tablet Take 1 tablet (250 mg total) by mouth at bedtime. 11/16/22   Franchot [name] SQUIBB, PMHNP  dorzolamide  (TRUSOPT ) 2 % ophthalmic solution Place 1 drop into both eyes in the morning and at bedtime.    [provider]  escitalopram  (LEXAPRO ) 20 MG tablet Take 1 tablet (20 mg total) by mouth daily. 11/16/22   Franchot [name] SQUIBB, PMHNP  ezetimibe  (ZETIA ) 10 MG tablet TAKE 1 TABLET(10 MG) BY MOUTH DAILY 03/14/24   Eubanks, Jessica K, NP  furosemide  (LASIX ) 40 MG tablet Take 1 tablet (40 mg total) by mouth daily as needed for fluid or edema. 12/14/23   Rhyne, Samantha J, PA-C  ketoconazole  (NIZORAL ) 2 % shampoo APPLY 10ML TOPICALLY 2 TIMES A WEEK. 11/03/22   Caro [name] POUR, NP  levothyroxine  (SYNTHROID ) 150 MCG tablet Take 1 tablet (150 mcg total) by mouth daily at 6 (six) AM. 12/25/23   Perri DELENA Meliton Mickey., MD  meclizine  (ANTIVERT ) 25 MG tablet Take 1 tablet (25 mg total) by mouth as needed for dizziness. Patient taking differently: Take 25 mg by mouth 3 (three) times daily as needed for dizziness. 11/30/18   Caro [name] POUR, NP  metoprolol  succinate (TOPROL -XL) 25 MG 24 hr tablet Take 1 tablet (25 mg total) by mouth daily. 11/29/23   Caro [name] POUR,  NP  nitroGLYCERIN  (NITROSTAT ) 0.4 MG SL tablet Place 1 tablet (0.4 mg total) under the tongue every 5 (five) minutes x 3 doses as needed for chest pain. 05/20/21   Ngetich, Dinah C, NP  pantoprazole  (PROTONIX ) 40 MG tablet Take 1 tablet (40 mg total) by mouth daily. 09/12/23   Sherlynn Madden, MD  QUEtiapine  (SEROQUEL ) 25 MG tablet Take 1 tablet (25 mg total) by mouth at bedtime. Would wean as tolerated 12/24/23 02/06/24  Perri DELENA Meliton Mickey., MD  TYLENOL  500 MG tablet Take 500-1,000 mg by mouth 2 (two) times daily.    [provider]    Allergies: Lidocaine , Cymbalta  [duloxetine  hcl], Donepezil, Mobic [meloxicam],  Namenda [memantine], Nsaids, Other, Oxycontin  [oxycodone ], and Vicodin [hydrocodone-acetaminophen ]    Review of Systems  All other systems reviewed and are negative.   Updated Vital Signs BP (!) 139/118 (BP Location: Right Arm)   Pulse 89   Temp 98.1 F (36.7 C) (Oral)   Resp 17   Ht 5' 1 (1.549 m)   Wt 75.1 kg   SpO2 96%   BMI 31.28 kg/m   Physical Exam Vitals and nursing note reviewed.  Constitutional:      General: She is not in acute distress.    Appearance: Normal appearance.  HENT:     Head: Normocephalic and atraumatic.  Eyes:     General:        Right eye: No discharge.        Left eye: No discharge.  Cardiovascular:     Comments: Regular rate and rhythm.  S1/S2 are distinct without any evidence of murmur, rubs, or gallops.  Radial pulses are 2+ bilaterally.  Dorsalis pedis pulses are 2+ bilaterally.  No evidence of pedal edema. Pulmonary:     Comments: Clear to auscultation bilaterally.  Normal effort.  No respiratory distress.  No evidence of wheezes, rales, or rhonchi heard throughout. Abdominal:     General: Abdomen is flat. Bowel sounds are normal. There is no distension.     Tenderness: There is no abdominal tenderness. There is no guarding or rebound.  Musculoskeletal:        General: Normal range of motion.     Cervical back: Neck supple.  Skin:    General: Skin is warm and dry.     Findings: No rash.  Neurological:     General: No focal deficit present.     Mental Status: She is alert and oriented to person, place, and time.  Psychiatric:        Mood and Affect: Mood normal.        Behavior: Behavior normal.     (all labs ordered are listed, but only abnormal results are displayed) Labs Reviewed  COMPREHENSIVE METABOLIC PANEL WITH GFR - Abnormal; Notable for the following components:      Result Value   Glucose, Bld 157 (*)    Creatinine, Ser 1.42 (*)    Total Protein 6.4 (*)    Total Bilirubin 1.4 (*)    GFR, Estimated 37 (*)    All other  components within normal limits  URINALYSIS, ROUTINE W REFLEX MICROSCOPIC - Abnormal; Notable for the following components:   APPearance HAZY (*)    Protein, ur 30 (*)    Bacteria, UA RARE (*)    All other components within normal limits  URINE CULTURE  LIPASE, BLOOD  CBC    EKG: None  Radiology: CT Head Wo Contrast Result Date: 03/29/2024 CLINICAL DATA:  Confusion for several  days.  Abnormal mental status. EXAM: CT HEAD WITHOUT CONTRAST TECHNIQUE: Contiguous axial images were obtained from the base of the skull through the vertex without intravenous contrast. RADIATION DOSE REDUCTION: This exam was performed according to the departmental dose-optimization program which includes automated exposure control, adjustment of the mA and/or kV according to patient size and/or use of iterative reconstruction technique. COMPARISON:  12/11/2023 FINDINGS: Brain: No evidence of intracranial hemorrhage, acute infarction, hydrocephalus, extra-axial collection, or mass lesion/mass effect. Stable mild bilateral frontal lobe encephalomalacia, which may be due to previous trauma or infarcts. Stable mild diffuse cerebral atrophy and chronic small vessel disease. Vascular:  No hyperdense vessel or other acute findings. Skull: No evidence of fracture or other significant bone abnormality. Sinuses/Orbits:  No acute findings. Other: None. IMPRESSION: No acute intracranial abnormality. Stable mild bilateral frontal lobe encephalomalacia, which may be due to previous trauma or infarcts. Stable cerebral atrophy and chronic small vessel disease. Electronically Signed   By: Norleen DELENA Kil M.D.   On: 03/29/2024 19:06     Procedures   Medications Ordered in the ED  sodium chloride  0.9 % bolus 1,000 mL (1,000 mLs Intravenous Not Given 03/29/24 1943)    Clinical Course as of 03/29/24 1953  Fri Mar 29, 2024  8069 I called the lab and added on a urine culture onto the UA. [CF]  1931 Urinalysis, Routine w reflex microscopic  -Urine, Clean Catch(!) Negative. [CF]  1932 CBC Negative. [CF]  1932 Comprehensive metabolic panel(!) Elevated creatinine but at patient's baseline. [CF]  1932 CT Head Wo Contrast CT scan was normal.  I do agree with the radiologist interpretation. [CF]    Clinical Course User Index [CF] Theotis Cameron HERO, PA-C    Medical Decision Making Deanna [name] is a 83 y.o. female patient presents to the emerged part today for further evaluation of altered mental status.  Patient is hypotensive here.  Will plan to give her some fluids.  No fever.  Will get a urinalysis with culture, CT head, and some other basic labs.  Patient is in no acute distress.  She is GCS 15 and alert and oriented x 3.  Answers all my questions appropriately.  Workup is negative.  Urine was clean.  CT scan did not show any significant abnormalities.  Blood work was also normal.  I have sent her urine off for culture.  If this is positive we will contact her and place her on some antibiotics.  Daughter is aware.  Patient and daughter are comfortable with discharge home.  Will have her follow-up with her primary care doctor.  Strict turn precautions were discussed.  She is safe for discharge.  Amount and/or Complexity of Data Reviewed Labs: ordered. Decision-making details documented in ED Course. Radiology: ordered. Decision-making details documented in ED Course.     Final diagnoses:  Transient alteration of awareness    ED Discharge Orders     None          Theotis Cameron HERO, [place] 03/29/24 1953    Tegeler, Lonni PARAS, MD 03/29/24 2037

## 2024-03-29 NOTE — Telephone Encounter (Signed)
 FYI Only or Action Required?: FYI only for provider.  Patient was last seen in primary care on 02/06/2024 by Medina-Vargas, Jereld BROCKS, NP.  Called Nurse Triage reporting Urinary Tract Infection.  Symptoms began yesterday.  Interventions attempted: Nothing.  Symptoms are: rapidly worsening.  Triage Disposition: Go to ED Now (or PCP Triage)  Patient/caregiver understands and will follow disposition?: UnsureCopied from CRM #[id number]. Topic: Clinical - Red Word Triage >> Mar 29, 2024 12:45 PM Mercer PEDLAR wrote: Red Word that prompted transfer to Nurse Triage: daughter, Bruno FALCON. Young calling regarding possible UTI, patient is having confusion and hallucinations. Reason for Disposition  Patient sounds very sick or weak to the triager  Answer Assessment - Initial Assessment Questions Pt has history of UTI. Monica daughter called with concerns of behavior changes. My mom is snarling and hissing at me. She keeps speaking hateful things. She's like the devil. The doctors told me after her stroke her symptoms would be worse. No office availability . RN called [facility]. Pt was advised to go to UC/ED due to limited providers in office and missed appts. Monica is very upset. We don't have money to go to those. We will find a new provider.  It is unclear if pt will be taken to ED.      1. SYMPTOM: What's the main symptom you're concerned about? (e.g., frequency, incontinence)     Darker color urine 2. ONSET: When did the    start?     Yesterday  3. PAIN: Is there any pain? If Yes, ask: How bad is it? (Scale: 1-10; mild, moderate, severe)     Not sure 4. CAUSE: What do you think is causing the symptoms?     Possible UTI 5. OTHER SYMPTOMS: Do you have any other symptoms? (e.g., blood in urine, fever, flank pain, pain with urination)     Confusion, hallucinations  Protocols used: Urinary Symptoms-A-AH

## 2024-03-29 NOTE — Telephone Encounter (Signed)
 Agree, please see if they are also willing to reschedule follow up

## 2024-03-29 NOTE — ED Notes (Signed)
 Unable to obtain labs X2

## 2024-03-30 LAB — URINE CULTURE

## 2024-04-01 NOTE — Telephone Encounter (Signed)
 Patient has an upcoming appointment with [name].

## 2024-04-01 NOTE — Telephone Encounter (Signed)
 Message routed to clinical intake for today Donzell.M/CMA.

## 2024-04-03 ENCOUNTER — Ambulatory Visit: Payer: Self-pay

## 2024-04-03 DIAGNOSIS — F411 Generalized anxiety disorder: Secondary | ICD-10-CM

## 2024-04-03 NOTE — Telephone Encounter (Signed)
 Tried contacting patients daughter [name] ,unable to reach her , left a voicemail regarding patients medications.

## 2024-04-03 NOTE — Telephone Encounter (Signed)
 Copied from CRM #[id number]. Topic: Clinical - Red Word Triage >> Apr 03, 2024  1:04 PM Alfonso ORN wrote: Red Word that prompted transfer to Nurse Triage: patient experiencing uncontrollable behavior pt is delusional  and thinking daughter doing something to her , verbally attacking the daughter Virgie F. Young) ,worst at night screaming at daughter  Daughter thinking patient behavior coming from a stroke she had (daughter have question regarding pt medication refills) call back [phone] Reason for Disposition  Patient sounds very sick or weak to the triager  Answer Assessment - Initial Assessment Questions Daughter called with worsening concerns of confusion. Pt was seen at ED but wasn't able to get clean catch. She keeps screaming at me. She's hostile towards me. It's like she's sundowning all day. She's hostile around 4p. Im going to wait for her to be acting up so they can see it. Thryroid medication dosage needs updating. Pt needs 0.1 and pharmacy has .175. Please advise on medication.      1. LEVEL OF CONSCIOUSNESS: How are they (the patient) acting right now? (e.g., alert-oriented, confused, lethargic, stuporous, comatose)     Confused, 'hissing at me' 2. ONSET: When did the confusion start?  (e.g., minutes, hours, days)     1 week  3. PATTERN: Does this come and go, or has it been constant since it started?  Is it present now?     Constant  4. ALCOHOL or DRUGS: Have they been drinking alcohol or taking any drugs?      denies 5. NARCOTIC MEDICINES: Have they been receiving any narcotic medications? (e.g., morphine , Vicodin)    no 6. CAUSE: What do you think is causing the confusion?      Possible UTI 7. OTHER SYMPTOMS: Are there any other symptoms? (e.g., difficulty breathing, fever, headache, weakness)     UTI symptoms- urine is brown, burning, odor  Protocols used: Confusion - Delirium-A-AH

## 2024-04-03 NOTE — Telephone Encounter (Signed)
 Patient needs medication XANAX  approved , controlled substance. Doesn't seem like patient has a Community education officer on file.  Patient next appointment is 9.22.25.

## 2024-04-23 ENCOUNTER — Emergency Department (HOSPITAL_COMMUNITY)
Admission: EM | Admit: 2024-04-23 | Discharge: 2024-04-29 | Disposition: A | Discharge status: Home / Self Care | Attending: Emergency Medicine | Admitting: Emergency Medicine

## 2024-04-23 DIAGNOSIS — R300 Dysuria: Secondary | ICD-10-CM | POA: Diagnosis not present

## 2024-04-23 DIAGNOSIS — Z7902 Long term (current) use of antithrombotics/antiplatelets: Secondary | ICD-10-CM | POA: Insufficient documentation

## 2024-04-23 DIAGNOSIS — Z7982 Long term (current) use of aspirin: Secondary | ICD-10-CM | POA: Insufficient documentation

## 2024-04-23 DIAGNOSIS — F03918 Unspecified dementia, unspecified severity, with other behavioral disturbance: Secondary | ICD-10-CM | POA: Insufficient documentation

## 2024-04-23 DIAGNOSIS — F03C2 Unspecified dementia, severe, with psychotic disturbance: Secondary | ICD-10-CM | POA: Insufficient documentation

## 2024-04-23 DIAGNOSIS — I10 Essential (primary) hypertension: Secondary | ICD-10-CM | POA: Diagnosis not present

## 2024-04-23 DIAGNOSIS — R404 Transient alteration of awareness: Secondary | ICD-10-CM | POA: Diagnosis not present

## 2024-04-23 DIAGNOSIS — F29 Unspecified psychosis not due to a substance or known physiological condition: Secondary | ICD-10-CM | POA: Diagnosis not present

## 2024-04-23 DIAGNOSIS — R410 Disorientation, unspecified: Secondary | ICD-10-CM | POA: Diagnosis not present

## 2024-04-23 DIAGNOSIS — R000 Tachycardia, unspecified: Secondary | ICD-10-CM | POA: Diagnosis not present

## 2024-04-23 DIAGNOSIS — R4182 Altered mental status, unspecified: Secondary | ICD-10-CM | POA: Diagnosis present

## 2024-04-23 LAB — COMPREHENSIVE METABOLIC PANEL WITH GFR
ALT: 8 U/L (ref 0–44)
AST: 20 U/L (ref 15–41)
Albumin: 4.2 g/dL (ref 3.5–5.0)
Alkaline Phosphatase: 104 U/L (ref 38–126)
Anion gap: 13 (ref 5–15)
BUN: 18 mg/dL (ref 8–23)
CO2: 24 mmol/L (ref 22–32)
Calcium: 10 mg/dL (ref 8.9–10.3)
Chloride: 105 mmol/L (ref 98–111)
Creatinine, Ser: 1.2 mg/dL — ABNORMAL HIGH (ref 0.44–1.00)
GFR, Estimated: 45 mL/min — ABNORMAL LOW (ref >60)
Glucose, Bld: 107 mg/dL — ABNORMAL HIGH (ref 70–99)
Potassium: 4.3 mmol/L (ref 3.5–5.1)
Sodium: 141 mmol/L (ref 135–145)
Total Bilirubin: 1 mg/dL (ref 0.0–1.2)
Total Protein: 6.9 g/dL (ref 6.5–8.1)

## 2024-04-23 LAB — URINALYSIS, ROUTINE W REFLEX MICROSCOPIC
Bilirubin Urine: NEGATIVE
Glucose, UA: NEGATIVE mg/dL
Hgb urine dipstick: NEGATIVE
Ketones, ur: NEGATIVE mg/dL
Leukocytes,Ua: NEGATIVE
Nitrite: NEGATIVE
Protein, ur: NEGATIVE mg/dL
Specific Gravity, Urine: 1.011 (ref 1.005–1.030)
pH: 6 (ref 5.0–8.0)

## 2024-04-23 LAB — CBC
HCT: 47.2 % — ABNORMAL HIGH (ref 36.0–46.0)
Hemoglobin: 14.6 g/dL (ref 12.0–15.0)
MCH: 27.1 pg (ref 26.0–34.0)
MCHC: 30.9 g/dL (ref 30.0–36.0)
MCV: 87.7 fL (ref 80.0–100.0)
Platelets: 212 K/uL (ref 150–400)
RBC: 5.38 MIL/uL — ABNORMAL HIGH (ref 3.87–5.11)
RDW: 14.1 % (ref 11.5–15.5)
WBC: 8.5 K/uL (ref 4.0–10.5)
nRBC: 0 % (ref 0.0–0.2)

## 2024-04-23 LAB — CBG MONITORING, ED: Glucose-Capillary: 117 mg/dL — ABNORMAL HIGH (ref 70–99)

## 2024-04-23 MED ORDER — RISPERIDONE 0.5 MG PO TBDP
2.0000 mg | ORAL_TABLET | Freq: Three times a day (TID) | ORAL | Status: DC | PRN
  Administered 2024-04-24 – 2024-04-27 (×4): 2 mg via ORAL
  Filled 2024-04-23 (×4): qty 4

## 2024-04-23 MED ORDER — HALOPERIDOL LACTATE 5 MG/ML IJ SOLN
5.0000 mg | Freq: Once | INTRAMUSCULAR | Status: AC
  Administered 2024-04-23: 5 mg via INTRAVENOUS
  Filled 2024-04-23: qty 1

## 2024-04-23 MED ORDER — ZIPRASIDONE MESYLATE 20 MG IM SOLR
20.0000 mg | INTRAMUSCULAR | Status: AC | PRN
  Administered 2024-04-24: 20 mg via INTRAMUSCULAR
  Filled 2024-04-23: qty 20

## 2024-04-23 MED ORDER — ACETAMINOPHEN 325 MG PO TABS
650.0000 mg | ORAL_TABLET | ORAL | Status: DC | PRN
  Administered 2024-04-24 – 2024-04-27 (×6): 650 mg via ORAL
  Filled 2024-04-23 (×6): qty 2

## 2024-04-23 MED ORDER — STERILE WATER FOR INJECTION IJ SOLN
INTRAMUSCULAR | Status: AC
  Filled 2024-04-23: qty 10

## 2024-04-23 MED ORDER — LORAZEPAM 1 MG PO TABS
1.0000 mg | ORAL_TABLET | ORAL | Status: AC | PRN
  Administered 2024-04-27: 1 mg via ORAL
  Filled 2024-04-23: qty 1

## 2024-04-23 NOTE — ED Triage Notes (Signed)
 Patient is coming from home. Family reports patient is more confused and aggressive then normal pt. does have a history of dementia. Patient is alert and oriented X3. Pt. Reports irritation when she pees and some itching.

## 2024-04-23 NOTE — ED Provider Notes (Addendum)
 [place] EMERGENCY DEPARTMENT AT [facility] Provider Note   CSN: [id number] Arrival date & time: 04/23/24  1925     Patient presents with: Altered Mental Status   Deanna [name] is a 83 y.o. female.   HPI     83 year old female comes in with chief complaint of altered mental status. Patient sent to the emergency room by daughter.  According to daughter, patient had a stroke in May, ever since then she has had worsening behavior.  More recently, patient has become paranoid, psychotic, she grabs at her and is hostile.  She cannot recognize her daughter anymore.  Instead of being agitated during sundowning, patient is agitated throughout the day.  Per daughter, patient used to be on Depakote  and Zoloft  for mood swings.  Her psychiatrist left the practice, ever since then patient has not been getting those medications.  When she was admitted for her stroke, patient was discharged with Seroquel  which was helping.  She was also getting as needed Xanax .  PCP did not refill those medications.  PCP also did not give her gabapentin  which was prescribed for her pain.  Patient has not been on any meds over the last at least a month and she gets Tylenol  for her pain.    Prior to Admission medications   Medication Sig Start Date End Date Taking? Authorizing Provider  albuterol  (VENTOLIN  HFA) 108 (90 Base) MCG/ACT inhaler Inhale 2 puffs into the lungs every 6 (six) hours as needed for wheezing or shortness of breath. 09/12/23   Sherlynn Madden, MD  ALPRAZolam  (XANAX ) 0.5 MG tablet Take 1 tablet (0.5 mg total) by mouth daily as needed for up to 2 doses for anxiety. 12/24/23   Perri DELENA Meliton Mickey., MD  aspirin  EC 81 MG tablet Take 81 mg by mouth at bedtime.    [provider]  atorvastatin  (LIPITOR) 40 MG tablet Take 1 tablet (40 mg total) by mouth daily. 12/24/23 12/23/24  Perri DELENA Meliton Mickey., MD  busPIRone  (BUSPAR ) 15 MG tablet Take 1 tablet (15 mg total) by mouth 2  (two) times daily. 11/16/22 08/08/48  Franchot [name] SQUIBB, PMHNP  Cholecalciferol  (VITAMIN D3) 1.25 MG (50000 UT) CAPS Take 1 capsule by mouth once a week. 10/26/23   [provider]  clopidogrel  (PLAVIX ) 75 MG tablet TAKE 1 TABLET(75 MG) BY MOUTH DAILY Patient taking differently: Take 75 mg by mouth at bedtime. 02/27/23   Eubanks, Jessica K, NP  Cyanocobalamin  (VITAMIN B-12 PO) Take 1 tablet by mouth in the morning.    [provider]  divalproex  (DEPAKOTE  ER) 250 MG 24 hr tablet Take 1 tablet (250 mg total) by mouth at bedtime. 11/16/22   Franchot [name] SQUIBB, PMHNP  dorzolamide  (TRUSOPT ) 2 % ophthalmic solution Place 1 drop into both eyes in the morning and at bedtime.    [provider]  escitalopram  (LEXAPRO ) 20 MG tablet Take 1 tablet (20 mg total) by mouth daily. 11/16/22   Franchot [name] SQUIBB, PMHNP  ezetimibe  (ZETIA ) 10 MG tablet TAKE 1 TABLET(10 MG) BY MOUTH DAILY 03/14/24   Eubanks, Jessica K, NP  furosemide  (LASIX ) 40 MG tablet Take 1 tablet (40 mg total) by mouth daily as needed for fluid or edema. 12/14/23   Rhyne, Samantha J, PA-C  ketoconazole  (NIZORAL ) 2 % shampoo APPLY 10ML TOPICALLY 2 TIMES A WEEK. 11/03/22   Eubanks, Jessica K, NP  levothyroxine  (SYNTHROID ) 150 MCG tablet Take 1 tablet (150 mcg total) by mouth daily at 6 (six) AM.  12/25/23   Perri DELENA Meliton Mickey., MD  meclizine  (ANTIVERT ) 25 MG tablet Take 1 tablet (25 mg total) by mouth as needed for dizziness. Patient taking differently: Take 25 mg by mouth 3 (three) times daily as needed for dizziness. 11/30/18   Caro [name] POUR, NP  metoprolol  succinate (TOPROL -XL) 25 MG 24 hr tablet Take 1 tablet (25 mg total) by mouth daily. 11/29/23   Caro [name] POUR, NP  nitroGLYCERIN  (NITROSTAT ) 0.4 MG SL tablet Place 1 tablet (0.4 mg total) under the tongue every 5 (five) minutes x 3 doses as needed for chest pain. 05/20/21   Ngetich, Dinah C, NP  pantoprazole  (PROTONIX ) 40 MG tablet Take 1 tablet (40 mg total) by mouth  daily. 09/12/23   Sherlynn Madden, MD  QUEtiapine  (SEROQUEL ) 25 MG tablet Take 1 tablet (25 mg total) by mouth at bedtime. Would wean as tolerated 12/24/23 02/06/24  Perri DELENA Meliton Mickey., MD  TYLENOL  500 MG tablet Take 500-1,000 mg by mouth 2 (two) times daily.    [provider]    Allergies: Lidocaine , Cymbalta  [duloxetine  hcl], Donepezil, Mobic [meloxicam], Namenda [memantine], Nsaids, Other, Oxycontin  [oxycodone ], and Vicodin [hydrocodone-acetaminophen ]    Review of Systems  All other systems reviewed and are negative.   Updated Vital Signs BP (!) 167/89   Pulse 96   Temp 99.1 F (37.3 C) (Rectal)   Resp 18   SpO2 91%   Physical Exam Vitals and nursing note reviewed.  Constitutional:      Appearance: She is well-developed.  HENT:     Head: Atraumatic.  Eyes:     Extraocular Movements: Extraocular movements intact.     Pupils: Pupils are equal, round, and reactive to light.  Cardiovascular:     Rate and Rhythm: Normal rate.  Pulmonary:     Effort: Pulmonary effort is normal.  Musculoskeletal:     Cervical back: Normal range of motion and neck supple.  Skin:    General: Skin is warm and dry.  Neurological:     Mental Status: She is alert. Mental status is at baseline.     (all labs ordered are listed, but only abnormal results are displayed) Labs Reviewed  COMPREHENSIVE METABOLIC PANEL WITH GFR - Abnormal; Notable for the following components:      Result Value   Glucose, Bld 107 (*)    Creatinine, Ser 1.20 (*)    GFR, Estimated 45 (*)    All other components within normal limits  CBC - Abnormal; Notable for the following components:   RBC 5.38 (*)    HCT 47.2 (*)    All other components within normal limits  URINALYSIS, ROUTINE W REFLEX MICROSCOPIC - Abnormal; Notable for the following components:   Color, Urine STRAW (*)    All other components within normal limits  CBG MONITORING, ED - Abnormal; Notable for the following components:    Glucose-Capillary 117 (*)    All other components within normal limits    EKG: EKG Interpretation Date/Time:  Tuesday April 23 2024 19:37:49 EDT Ventricular Rate:  96 PR Interval:  172 QRS Duration:  100 QT Interval:  380 QTC Calculation: 481 R Axis:   25  Text Interpretation: Sinus rhythm Left ventricular hypertrophy Borderline T abnormalities, lateral leads Confirmed by Charlyn Sora [id number]) on 04/23/2024 11:53:15 PM  Radiology: No results found.   Procedures   Medications Ordered in the ED  risperiDONE  (RISPERDAL  M-TABS) disintegrating tablet 2 mg (has no administration in time range)    And  LORazepam  (ATIVAN ) tablet 1 mg (has no administration in time range)    And  ziprasidone  (GEODON ) injection 20 mg (has no administration in time range)  acetaminophen  (TYLENOL ) tablet 650 mg (has no administration in time range)  haloperidol  lactate (HALDOL ) injection 5 mg (5 mg Intravenous Given 04/23/24 2332)                                    Medical Decision Making Amount and/or Complexity of Data Reviewed Labs: ordered.  Risk OTC drugs. Prescription drug management.   83 year old patient comes in with chief complaint of altered mental status.  Patient has history of dementia, stroke. Collateral history.  By patient's daughter.  I also reviewed patient's discharge summary from May and also reviewed her previous medications and PCP notes.  I am surprised that patient has not had any psychotropic medications.  Differential diagnosis considered of this patient includes electrolyte abnormality, UTI, worsening dementia and behavioral changes secondary to it, worsening dementia with psychotic features.  After talking to the daughter, plan is for patient holding patient for psychiatry to assess and see if they can start her on some medications to control her symptoms better.  Daughter is willing to take patient home, but states that if this type of behavior continues they  will have to start looking at placement at memory unit.   Final diagnoses:  Severe dementia with psychotic disturbance, unspecified dementia type [facility])    ED Discharge Orders     None          Charlyn Sora, MD 04/23/24 2354    Charlyn Sora, MD 04/23/24 2354

## 2024-04-24 DIAGNOSIS — F29 Unspecified psychosis not due to a substance or known physiological condition: Secondary | ICD-10-CM

## 2024-04-24 DIAGNOSIS — R4182 Altered mental status, unspecified: Secondary | ICD-10-CM

## 2024-04-24 MED ORDER — DIVALPROEX SODIUM ER 250 MG PO TB24
250.0000 mg | ORAL_TABLET | Freq: Every day | ORAL | Status: DC
  Administered 2024-04-24 – 2024-04-28 (×5): 250 mg via ORAL
  Filled 2024-04-24 (×5): qty 1

## 2024-04-24 MED ORDER — ESCITALOPRAM OXALATE 10 MG PO TABS
10.0000 mg | ORAL_TABLET | Freq: Every day | ORAL | Status: DC
Start: 2024-04-24 — End: 2024-04-29
  Administered 2024-04-24 – 2024-04-29 (×5): 10 mg via ORAL
  Filled 2024-04-24 (×5): qty 1

## 2024-04-24 MED ORDER — DIPHENHYDRAMINE HCL 50 MG/ML IJ SOLN
25.0000 mg | Freq: Once | INTRAMUSCULAR | Status: AC
  Administered 2024-04-24: 25 mg via INTRAVENOUS
  Filled 2024-04-24: qty 1

## 2024-04-24 MED ORDER — QUETIAPINE FUMARATE 25 MG PO TABS
25.0000 mg | ORAL_TABLET | Freq: Every day | ORAL | Status: DC
  Administered 2024-04-24 – 2024-04-28 (×5): 25 mg via ORAL
  Filled 2024-04-24 (×5): qty 1

## 2024-04-24 NOTE — Consult Note (Addendum)
 [facility] Psychiatric Consult Initial  Patient Name: .Deanna [name]  MRN: [id number]  DOB: 1941/03/22  Consult Order details:  Orders (From admission, onward)     Start     Ordered   04/23/24 2335  CONSULT TO CALL ACT TEAM       Ordering Provider: Charlyn Sora, MD  Provider:  (Not yet assigned)  Question:  Reason for Consult?  Answer:  Psych consult   04/23/24 2335             Mode of Visit: In person    Psychiatry Consult Evaluation  Service Date: April 24, 2024 LOS:  LOS: 0 days  Chief Complaint Problems with memory and intermittent hallucinations  Primary Psychiatric Diagnoses  Altered mental status 2.  Psychosis   Assessment  Deanna [name] is a 83 y.o. female admitted: Presented to the EDfor 04/23/2024  7:25 PM for brought in by daughter from home for confusion and aggression. She carries the psychiatric diagnoses of anxiety, depression, mood disorder and cognitive impairment and has a past medical history of MI, obesity, UTIs, stage 3b CKD, osteoarthritis, TBI, GERD, OSA, CVA, paroxysmal atrial fibrillation, CHF, HTN, carotid artery stenosis, multiple stents and a quadruple cardiac bypass.   Her current presentation of pleasant confusion is most consistent with altered mental status. She meets criteria for inpatient psychiatric hospitalization based on intermittent psychosis and aggression requiring stabilization.  Current outpatient psychotropic medications include lexapro , seroquel  and depakote  and historically she has had a positive response to these medications. She was not compliant with medications prior to admission for the last few weeks as evidenced by daughters report that medications ran out and they have been unable to find a new psychiatrist for refills. Previous provider left. On initial examination, patient is pleasantly confused and somewhat anxious. Please see plan below for detailed recommendations.   Diagnoses:  Active Hospital  problems: Principal Problem:   Altered mental status Active Problems:   Psychosis (HCC)    Plan   ## Psychiatric Medication Recommendations:  Restart Home Medications --Seroquel  25mg  PO Q HS --Lexapro  10mg  PO Q day with plan to increase to 20mg  Q day --Depakote  ER 250mg  PO Q HS  ## Medical Decision Making Capacity: Not specifically addressed in this encounter  ## Further Work-up:  -- most recent EKG on 04/23/24 had QtC of 481 -- Pertinent labwork reviewed earlier this admission includes: CMP, CBC and UDS   ## Disposition:-- We recommend inpatient psychiatric hospitalization when medically cleared. Patient is under voluntary admission status at this time; please IVC if attempts to leave hospital.  ## Behavioral / Environmental: -Delirium Precautions: Delirium Interventions for Nursing and Staff: - RN to open blinds every AM. - To Bedside: Glasses, hearing aide, and pt's own shoes. Make available to patients. when possible and encourage use. - Encourage po fluids when appropriate, keep fluids within reach. - OOB to chair with meals. - Passive ROM exercises to all extremities with AM & PM care. - RN to assess orientation to person, time and place QAM and PRN. - Recommend extended visitation hours with familiar family/friends as feasible. - Staff to minimize disturbances at night. Turn off television when pt asleep or when not in use.    ## Safety and Observation Level:  - Based on my clinical evaluation, I estimate the patient to be at low risk of self harm in the current setting. - At this time, we recommend  routine. This decision is based on my review of the chart including  patient's history and current presentation, interview of the patient, mental status examination, and consideration of suicide risk including evaluating suicidal ideation, plan, intent, suicidal or self-harm behaviors, risk factors, and protective factors. This judgment is based on our ability to directly address  suicide risk, implement suicide prevention strategies, and develop a safety plan while the patient is in the clinical setting. Please contact our team if there is a concern that risk level has changed.  CSSR Risk Category:C-SSRS RISK CATEGORY: No Risk  Suicide Risk Assessment: Patient has following modifiable risk factors for suicide: medication noncompliance, active mental illness (to encompass adhd, tbi, mania, psychosis, trauma reaction), and current symptoms: anxiety/panic, insomnia, impulsivity, anhedonia, hopelessness, which we are addressing by recommending inpatient psychiatric hospitalization. Patient has following non-modifiable or demographic risk factors for suicide: none Patient has the following protective factors against suicide: Access to outpatient mental health care, Supportive family, and no history of suicide attempts  Thank you for this consult request. Recommendations have been communicated to the primary team.  We will continue to follow at this time.   Efrain DELENA Patient, NP       History of Present Illness  Relevant Aspects of Hospital ED Course:  Admitted on 04/23/2024 for brought in by daughter from home for confusion and aggression. She carries the psychiatric diagnoses of anxiety, depression, mood disorder and cognitive impairment and has a past medical history of MI, obesity, UTIs, stage 3b CKD, osteoarthritis, TBI, GERD, OSA, CVA, paroxysmal atrial fibrillation, CHF, HTN, carotid artery stenosis, multiple stents and a quadruple cardiac bypass.   Patient Report:  Deanna [name], is seen face to face by this provider, consulted with Dr. Larina; and chart reviewed on 04/24/24.  On evaluation Deanna [name] reports she doesn't know why she is here.  She knows who she is and that she is at [employer].  She does not know the month or year.  She is unable to articulate the situation.  She endorses memory problems.   Patient had a stroke 3 months ago.  She ran out her  psychiatric medications a few weeks ago and was unable to get refills because they have been unable to find a new psychiatric provider after her previous provider left. The patients intermittent agitation and aggression has gotten worse and her sundowning behaviors are occurring more often. Patient is seeing a witch that is trying to make her drink wine in her room at night when sundowning and it scares her.  Patient and her daughter want patient reestablished on her medications and stabilized.  During evaluation Deanna [name] is laying in bed in no acute distress relying heavily on her daughter at bedside to answer questions.  She is alert & oriented x 2, calm, cooperative and attentive for this assessment.  Her mood is anxious with congruent affect.  She has normal speech, and behavior.  Objectively there is evidence of intermittent psychosis and delusional thinking. Pt does not appear to be responding to internal or external stimuli currently.  Patient is able to converse clearly; she is somewhat confused and is slightly hard of hearing.  She denies suicidal/self-harm/homicidal ideation; she endorses intermittent psychosis, and paranoia.  Patient struggled to answer questions appropriately.    I personally spent a total of 60 minutes in the care of the patient today including preparing to see the patient, getting/reviewing separately obtained history, performing a medically appropriate exam/evaluation, counseling and educating, placing orders, referring and communicating with other health care professionals, documenting clinical information  in the EHR, independently interpreting results, communicating results, and coordinating care.  Psych ROS:  Depression: endorses Anxiety:  endorses Mania (lifetime and current): denies Psychosis: (lifetime and current): endorses  Collateral information:  Contacted Sanjuana Mruk at [phone] on 04/24/2024  Daughter is at bedside and provides much of the  information during the assessment.  She has been patient's primary care taker for 10 years.  Review of Systems  HENT:  Positive for hearing loss.   Neurological:  Positive for weakness.  Psychiatric/Behavioral:  Positive for hallucinations and memory loss. The patient is nervous/anxious.      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 Hx: none Living Situation: lives with daughter Spiritual Hx: unknown Access to weapons/lethal means: denies   Substance History Alcohol: denies  Tobacco: denies  Illicit drugs: denies  Prescription drug abuse: denies  Rehab hx: denies   Exam Findings  Physical Exam:  Vital Signs:  Temp:  [97.8 F (36.6 C)-99.1 F (37.3 C)] 97.8 F (36.6 C) (09/17 0716) Pulse Rate:  [96-107] 103 (09/17 1000) Resp:  [16-19] 18 (09/17 1000) BP: (108-167)/(69-93) 108/93 (09/17 1000) SpO2:  [91 %-99 %] 94 % (09/17 1000) Weight:  [74.8 kg] 74.8 kg (09/17 0800) Blood pressure (!) 108/93, pulse (!) 103, temperature 97.8 F (36.6 C), temperature source Oral, resp. rate 18, height 5' 1 (1.549 m), weight 74.8 kg, SpO2 94%. Body mass index is 31.18 kg/m.  Physical Exam Vitals and nursing note reviewed.  Constitutional:      Appearance: She is obese.  Eyes:     Pupils: Pupils are equal, round, and reactive to light.  Pulmonary:     Effort: Pulmonary effort is normal.  Skin:    General: Skin  is dry.  Neurological:     Mental Status: She is alert. Mental status is at baseline.     Motor: Weakness present.     Mental Status Exam: General Appearance: Disheveled  Orientation:  Other:  person and place  Memory:  Immediate;   Poor Recent;   Poor Remote;   Poor  Concentration:  Concentration: Poor  Recall:  Poor  Attention  Poor  Eye Contact:  Fair  Speech:  Clear  Language:  Fair  Volume:  Normal  Mood: anxious  Affect:  Congruent  Thought Process:  NA  Thought Content:  Hallucinations: Auditory Visual  Suicidal Thoughts:  No  Homicidal Thoughts:  No  Judgement:  Impaired  Insight:  Lacking  Psychomotor Activity:  Decreased  Akathisia:  No  Fund of Knowledge:  Poor      Assets:  Housing Social Support  Cognition:  Impaired,  Moderate  ADL's:  Impaired  AIMS (if indicated):        Other History   These have been pulled in through the EMR, reviewed, and updated if appropriate.  Family History:  The patient's family history includes Anxiety disorder in her daughter and sister; Anxiety disorder (age of onset: 26) in her sister; Cerebral palsy (age of onset: 29) in her sister;  Colon cancer (age of onset: 19) in her brother; Congestive Heart Failure (age of onset: 53) in her sister; Dementia in her sister; Depression in her daughter; Diabetes in her father; Heart Problems in her sister; Heart Problems (age of onset: 72) in her sister; Heart attack in her mother; Heart attack (age of onset: 85) in her father; Heart attack (age of onset: 38) in her brother and sister; Heart attack (age of onset: 53) in her sister; Heart disease in her father and mother; Hyperlipidemia in her father; Hypertension in her daughter and father; Hypertension (age of onset: 38) in her sister; Irritable bowel syndrome in her daughter; Prostate cancer (age of onset: 20) in her brother; Stroke in her sister.  Medical History: Past Medical History:  Diagnosis Date   Anxiety    Bacteremia,  escherichia coli 04/27/2015   Brachial-basilar insufficiency syndrome    CAD (coronary artery disease)    Celiac artery stenosis (HCC)    Chronic bilateral low back pain without sciatica    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   CKD (chronic kidney disease), stage IV (HCC)    Cognitive impairment    Colon polyps    Community acquired pneumonia    Depression    Diverticulosis    Dysuria    Encephalomalacia    GAD (generalized anxiety disorder)    GERD (gastroesophageal reflux disease)    Glaucoma    Hearing loss    Hemorrhoids    Hypertension    Hypothyroidism    Incontinence    Mixed hyperlipidemia    Myocardial infarction (HCC)    NSTEMI (non-ST elevated myocardial infarction) (HCC) 12/19/2011   OSA on CPAP    PAF (paroxysmal atrial fibrillation) (HCC)    Peripheral arterial disease (HCC)    Pneumonitis 04/26/2015   Pyelonephritis due to Escherichia coli 04/28/2015   Sepsis (HCC)    Spondylosis of lumbar region without myelopathy or radiculopathy    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HCC)    Urticaria    Vertigo, benign positional     Surgical History: Past Surgical History:  Procedure Laterality Date   ABDOMINAL AORTOGRAM W/LOWER EXTREMITY N/A 08/22/2017   Procedure: ABDOMINAL AORTOGRAM W/LOWER EXTREMITY;  Surgeon: Serene Gaile ORN, MD;  [facility]: [facility] INVASIVE CV LAB;  Service: Cardiovascular;  Laterality: N/A;   BILATERAL UPPER EXTREMITY ANGIOGRAM N/A 09/11/2012   Procedure: BILATERAL UPPER EXTREMITY ANGIOGRAM;  Surgeon: Gaile ORN Serene, MD;  Location[facility] CATH LAB;  Service: Cardiovascular;  Laterality: N/A;   BIOPSY  03/25/2020   Procedure: BIOPSY;  Surgeon: Wilhelmenia Aloha Raddle., MD;  Location: [facility] ENDOSCOPY;  Service: Gastroenterology;;   carotid duplex doppler Bilateral 09/03/2012, 11/03/2011   Evidence of 40%-59% bilateral internal carotid artery stenosis; however, velocities may be underestimated due to calcific  plaque with acoustic shadowing which makes doppler interrogation difficult. patent left common carotid- subclavian artery bypass with turbulent flow noted at the anastomosis with velocities of 295 cm/s   CAROTID-SUBCLAVIAN BYPASS GRAFT  12/15/2011   Procedure: BYPASS GRAFT CAROTID-SUBCLAVIAN;  Surgeon: Gaile ORN Serene, MD;  Location: [facility] OR;  Service: Vascular;  Laterality: Left;  Left Carotid subclavian bypass   CORONARY ANGIOPLASTY     CORONARY ARTERY BYPASS GRAFT     CORONARY ATHERECTOMY N/A 03/19/2020   Procedure: CORONARY ATHERECTOMY;  Surgeon: Dann Candyce RAMAN, MD;  Location: [facility] INVASIVE CV LAB;  Service: Cardiovascular;  Laterality: N/A;   CORONARY STENT INTERVENTION N/A 03/19/2020   Procedure: CORONARY STENT INTERVENTION;  Surgeon:  Dann Candyce RAMAN, MD;  Location: [facility] INVASIVE CV LAB;  Service: Cardiovascular;  Laterality: N/A;   DOPPLER ECHOCARDIOGRAPHY  05/27/2010, 09/17/2008   Mild Proximal septal thickening is noted. Left ventricular systolic functions is normal ejection fraction =>55%. the aortic valve appears to be mildly sclerotic    ENDARTERECTOMY Right 12/13/2023   Procedure: ENDARTERECTOMY, CAROTID;  Surgeon: Serene Gaile ORN, MD;  Location: [facility] OR;  Service: Vascular;  Laterality: Right;   ESOPHAGOGASTRODUODENOSCOPY (EGD) WITH PROPOFOL  N/A 03/25/2020   Procedure: ESOPHAGOGASTRODUODENOSCOPY (EGD) WITH PROPOFOL ;  Surgeon: Wilhelmenia Aloha Raddle., MD;  Location: [facility] ENDOSCOPY;  Service: Gastroenterology;  Laterality: N/A;   fem-fem bypass graft  1999   holter monitor  01/21/2008   The predominant rhythm was normal sinus rhythm. Minimum heartrate of 63 bpm at +01:00, maximum heartrate of 105 bpm at + 10:35; and the average heartrate of 75 bpm. Ventricular ectopic activity totaled 1270: Multifocal; 866-PVC's and 404-VEs              JOINT REPLACEMENT     Left knee   LEFT HEART CATH AND CORS/GRAFTS ANGIOGRAPHY N/A 03/19/2020   Procedure: LEFT HEART CATH AND CORS/GRAFTS ANGIOGRAPHY;   Surgeon: Dann Candyce RAMAN, MD;  Location: [facility] INVASIVE CV LAB;  Service: Cardiovascular;  Laterality: N/A;   LEFT HEART CATHETERIZATION WITH CORONARY/GRAFT ANGIOGRAM N/A 12/21/2011   Procedure: LEFT HEART CATHETERIZATION WITH EL BILE;  Surgeon: Alm ORN Clay, MD;  Location: [facility] CATH LAB;  Service: Cardiovascular;  Laterality: N/A;   NM MYOCAR PERF EJECTION FRACTION  09/22/2009, 07/03/2007   the post stress myocardial perfusion images show a normal pattern of perfusion is all regions. The post-stress ejection fraction is 68 %. no significant wall motion abnormalities noted. This is a low risk scan.   PATCH ANGIOPLASTY Right 12/13/2023   Procedure: ANGIOPLASTY, USING PATCH GRAFT;  Surgeon: Serene Gaile ORN, MD;  Location: [facility] OR;  Service: Vascular;  Laterality: Right;   PERIPHERAL VASCULAR INTERVENTION  08/22/2017   Procedure: PERIPHERAL VASCULAR INTERVENTION;  Surgeon: Serene Gaile ORN, MD;  Location: [facility] INVASIVE CV LAB;  Service: Cardiovascular;;  Fem/Fem Graft   REPLACEMENT TOTAL KNEE  05-2011   UNILATERAL UPPER EXTREMEITY ANGIOGRAM Left 11/15/2011   Procedure: UNILATERAL UPPER VENITA BILE;  Surgeon: Dorn JINNY Lesches, MD;  Location: [facility] CATH LAB;  Service: Cardiovascular;  Laterality: Left;     Medications:   Current Facility-Administered Medications:    acetaminophen  (TYLENOL ) tablet 650 mg, 650 mg, Oral, Q4H PRN, Nanavati, Ankit, MD, 650 mg at 04/24/24 0116   risperiDONE  (RISPERDAL  M-TABS) disintegrating tablet 2 mg, 2 mg, Oral, Q8H PRN, 2 mg at 04/24/24 0400 **AND** LORazepam  (ATIVAN ) tablet 1 mg, 1 mg, Oral, PRN **AND** [COMPLETED] ziprasidone  (GEODON ) injection 20 mg, 20 mg, Intramuscular, PRN, Nanavati, Ankit, MD, 20 mg at 04/24/24 0020  Current Outpatient Medications:    albuterol  (VENTOLIN  HFA) 108 (90 Base) MCG/ACT inhaler, Inhale 2 puffs into the lungs every 6 (six) hours as needed for wheezing or shortness of breath., Disp: 8 g, Rfl: 2   ALPRAZolam  (XANAX ) 0.5  MG tablet, Take 1 tablet (0.5 mg total) by mouth daily as needed for up to 2 doses for anxiety., Disp: 2 tablet, Rfl: 0   aspirin  EC 81 MG tablet, Take 81 mg by mouth at bedtime., Disp: , Rfl:    atorvastatin  (LIPITOR) 40 MG tablet, Take 1 tablet (40 mg total) by mouth daily., Disp: , Rfl:    busPIRone  (BUSPAR ) 15 MG tablet, Take 1 tablet (15 mg total) by mouth  2 (two) times daily., Disp: 180 tablet, Rfl: 1   Cholecalciferol  (VITAMIN D3) 1.25 MG (50000 UT) CAPS, Take 1 capsule by mouth once a week., Disp: , Rfl:    clopidogrel  (PLAVIX ) 75 MG tablet, TAKE 1 TABLET(75 MG) BY MOUTH DAILY (Patient taking differently: Take 75 mg by mouth at bedtime.), Disp: 90 tablet, Rfl: 3   Cyanocobalamin  (VITAMIN B-12 PO), Take 1 tablet by mouth in the morning., Disp: , Rfl:    divalproex  (DEPAKOTE  ER) 250 MG 24 hr tablet, Take 1 tablet (250 mg total) by mouth at bedtime., Disp: 90 tablet, Rfl: 1   dorzolamide  (TRUSOPT ) 2 % ophthalmic solution, Place 1 drop into both eyes in the morning and at bedtime., Disp: , Rfl:    escitalopram  (LEXAPRO ) 20 MG tablet, Take 1 tablet (20 mg total) by mouth daily., Disp: 90 tablet, Rfl: 1   ezetimibe  (ZETIA ) 10 MG tablet, TAKE 1 TABLET(10 MG) BY MOUTH DAILY, Disp: 90 tablet, Rfl: 1   furosemide  (LASIX ) 40 MG tablet, Take 1 tablet (40 mg total) by mouth daily as needed for fluid or edema., Disp: , Rfl:    ketoconazole  (NIZORAL ) 2 % shampoo, APPLY TOPICALLY 2 TIMES A WEEK., Disp: 120 mL, Rfl: 5   levothyroxine  (SYNTHROID ) 150 MCG tablet, Take 1 tablet (150 mcg total) by mouth daily at 6 (six) AM., Disp: , Rfl:    meclizine  (ANTIVERT ) 25 MG tablet, Take 1 tablet (25 mg total) by mouth as needed for dizziness. (Patient taking differently: Take 25 mg by mouth 3 (three) times daily as needed for dizziness.), Disp: 30 tablet, Rfl: 0   metoprolol  succinate (TOPROL -XL) 25 MG 24 hr tablet, Take 1 tablet (25 mg total) by mouth daily., Disp: 90 tablet, Rfl: 1   nitroGLYCERIN  (NITROSTAT ) 0.4  MG SL tablet, Place 1 tablet (0.4 mg total) under the tongue every 5 (five) minutes x 3 doses as needed for chest pain., Disp: 25 tablet, Rfl: 1   pantoprazole  (PROTONIX ) 40 MG tablet, Take 1 tablet (40 mg total) by mouth daily., Disp: 90 tablet, Rfl: 1   QUEtiapine  (SEROQUEL ) 25 MG tablet, Take 1 tablet (25 mg total) by mouth at bedtime. Would wean as tolerated, Disp: , Rfl:    TYLENOL  500 MG tablet, Take 500-1,000 mg by mouth 2 (two) times daily., Disp: , Rfl:   Allergies: Allergies  Allergen Reactions   Lidocaine  Swelling and Other (See Comments)    Use CORRECT dose for age/weight- red welts and tongue swelling result, if not   Cymbalta  [Duloxetine  Hcl] Other (See Comments)    Increased confusion and memory concerns    Donepezil Other (See Comments)    Altered mood and Aggression     Mobic [Meloxicam] Other (See Comments)    Heart failure- cannot take   Namenda [Memantine] Other (See Comments)    Severe aggression   Nsaids Other (See Comments)    Cannot have due to heart issues   Other Other (See Comments)    No salt or anything that might cause fluid retention/swelling!!   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

## 2024-04-24 NOTE — Progress Notes (Addendum)
 Deanna [name]  MRN: [id number]   We recommend inpatient psychiatric hospitalization when medically cleared. Patient is under voluntary admission status at this time; please IVC if attempts to leave hospital. Per [facility], patient has been referred to following [facility] facilities:   Destination  Service Provider Request Status Services Address Phone Fax Patient Preferred  [facility]  Pending - Request Sent -- [address], [place] [address] [phone] --  [facility]  Pending - Request Sent -- [address], [place] [address] [phone] --  [facility]  Pending - Request Sent -- [address] [place], [place] [phone] --  [facility]  Pending - Request Sent -- [address] [place] [address] [phone] --  [facility]  Pending - Request Sent -- [address] [place], [place] [address] [phone] --  [facility]  Pending - Request Sent -- [address], [place] [address] [phone] --  [facility]  Pending - Request Sent -- [address] [place] [address] [phone] --  [facility]  Pending - Request Sent -- [address] [place] [address] [phone] --  [facility]  Pending - Request Sent -- [address] [place] [address] [phone] --  [facility]  Pending - Request Sent -- [address] [place] [address] [phone] --  [facility]  Pending - Request Sent -- [address] [place], [place]

## 2024-04-24 NOTE — ED Notes (Signed)
 TTS Clinician at bedside

## 2024-04-24 NOTE — ED Notes (Signed)
 Patient has not slept all night. Patient was out the bed. Pt. Was redirected and bed alarmed placed on pt. bed.

## 2024-04-24 NOTE — B[facility] Counselor (Signed)
@  12:17am: Patient was referred to [facility]. The referral was accepted by [facility] Coordinator, Holly, at [phone]. TTS Clinician provided requested details to the coordinator. [facility] will notify the ED care team once a provider is available to assess the patient. For any further questions, please contact the [facility] Coordinator directly

## 2024-04-24 NOTE — ED Notes (Signed)
 Pt asked if she can speak to her daughter. Daughter was called with no answer.

## 2024-04-24 NOTE — ED Notes (Signed)
 Writer spoke with [facility] coordinator Schuyler who advised [facility] will no longer be accepting pt. Pt has been referred back to in house resources.

## 2024-04-24 NOTE — Progress Notes (Signed)
 Per [facility], patient has been referred to following [facility] facilities:  Service Provider Phone  [facility]  [phone]  [facility]  [phone]  [place] [facility]  [phone]  [facility]  [phone]  [facility]  [phone]  [facility]  [phone]  [facility]  [phone]  [facility]  [phone]  [facility]  [phone]  [facility]  Medical Center-Geriatric  [phone]  [facility]  [address] [place] [phone]

## 2024-04-24 NOTE — ED Notes (Signed)
Patient given a sandwich.

## 2024-04-24 NOTE — ED Notes (Signed)
 Patient has not been to sleep all night. Patient is calling her daughters' name repeatedly. Attempts made to daughter. But daughter would not answer the phone.

## 2024-04-25 NOTE — Progress Notes (Signed)
 Inpatient Psychiatric Referral  Patient was recommended inpatient per Kyra  Weber, NP. There [facility] beds at [facility], per [facility] . Patient was referred to the following out of network facilities:  [facility] Provider Address Phone Fax  [facility]  [address] [place] [address] [phone]  [facility]  [address] [place] [address] [phone]  [facility] EFA[facility]  [address] [place], [place] [phone]  [facility]  [address] [place] [address] [phone]  [facility]  [address] [place], [place] [address] [phone]  [facility]  [address], [place] [address] [phone]  [facility]  [address] [place] [address] [phone]  [facility]  [address] [place] [address] [phone]  [facility]  [address] [place] [address] [phone]  [facility]  [address] [place] [address] [phone]  [facility]  [address] [place], [place] [address] [phone]  [facility]  [address] [place] [address] [phone]  [facility]  [place] [phone]  [facility]  [address], [place] [address] [phone]  [facility]  [address] [place] [address] [phone]  [facility]  [address] [place] [address] [phone]  [facility]  [address], [place]  [place] [address] [phone]  [facility]  [address] [place] [address] [phone]    Situation ongoing, CSW to continue following and update chart as more information becomes available.   Harrie Sofia MSW, LCSWA 04/25/2024  7:24PM

## 2024-04-25 NOTE — ED Notes (Signed)
 Patient asked for something for her arm pain

## 2024-04-25 NOTE — ED Notes (Signed)
 Pt has been changed into clean brief, repositioned in bed and had complete bed change.

## 2024-04-25 NOTE — ED Provider Notes (Signed)
 Emergency Medicine Observation Re-evaluation Note  Deanna [name] is a 83 y.o. female, seen on rounds today.  Pt initially presented to the ED for complaints of Altered Mental Status Currently, the patient is resting.  Physical Exam  BP 105/74 (BP Location: Left Arm)   Pulse 95   Temp 97.6 F (36.4 C)   Resp 16   Ht 1.549 m (5' 1)   Wt 74.8 kg   SpO2 93%   BMI 31.18 kg/m  Physical Exam  ED Course / MDM  EKG:EKG Interpretation Date/Time:  Tuesday April 23 2024 19:37:49 EDT Ventricular Rate:  96 PR Interval:  172 QRS Duration:  100 QT Interval:  380 QTC Calculation: 481 R Axis:   25  Text Interpretation: Sinus rhythm Left ventricular hypertrophy Borderline T abnormalities, lateral leads Confirmed by Charlyn Sora [phone]) on 04/23/2024 11:53:15 PM  I have reviewed the labs performed to date as well as medications administered while in observation.  Recent changes in the last 24 hours include recommend psychiatric inpatient hospitalization.  Plan  Current plan is for placement.    Deanna Faden, MD 04/25/24 [phone]

## 2024-04-26 NOTE — ED Provider Notes (Signed)
 Emergency Medicine Observation Re-evaluation Note  Deanna [name] is a 83 y.o. female, seen on rounds today.  Pt initially presented to the ED for complaints of Altered Mental Status Currently, the patient is resting.  Physical Exam  BP 112/85   Pulse 92   Temp 97.8 F (36.6 C) (Oral)   Resp 18   Ht 1.549 m (5' 1)   Wt 74.8 kg   SpO2 93%   BMI 31.18 kg/m  Physical Exam   ED Course / MDM  EKG:EKG Interpretation Date/Time:  Tuesday April 23 2024 19:37:49 EDT Ventricular Rate:  96 PR Interval:  172 QRS Duration:  100 QT Interval:  380 QTC Calculation: 481 R Axis:   25  Text Interpretation: Sinus rhythm Left ventricular hypertrophy Borderline T abnormalities, lateral leads Confirmed by Charlyn Sora [phone]) on 04/23/2024 11:53:15 PM  I have reviewed the labs performed to date as well as medications administered while in observation.  Recent changes in the last 24 hours include none.  Plan  Current plan is for placement.    Deanna Faden, MD 04/26/24 [phone]

## 2024-04-26 NOTE — ED Notes (Signed)
 Patient is alert and cooperative. Patient is confused and disoriented to time and place. Patient medication compliant.  Patient able to answer questions and follow directions.  Patient can ambulate but needs assist.

## 2024-04-26 NOTE — ED Notes (Signed)
 Patient resting in bed ongoing confusion with constant question asking for where is my purse and where am I oriented back to space.

## 2024-04-26 NOTE — ED Notes (Signed)
 Pt took a shower with assistance. Pt needed help with bathing and dressing.

## 2024-04-26 NOTE — Progress Notes (Signed)
 Inpatient Psychiatric Referral  Patient was recommended inpatient per Kyra  Weber, NP . There are [facility] beds at [facility], per [facility]. Patient was referred to the following out of network facilities:   [facility] Provider Address Phone Fax  [facility]  [address] [place] [address] [phone]  [facility]  [address] [place] [address] [phone]  [facility] EFAX  [address] [place], [place] [phone]  [facility]  [address] [place] [address] [phone]  [facility]  [address] [place], [place] [address] [phone]  [facility]  [address] [place], [place] [address] [phone]  [facility]  [address] [place] [address] [phone]  [facility]  [address], [place] [address] [phone]  [facility]  [address] [place] [address] [phone]  [facility]  [address] [place] [address] [phone]  [facility]  [address] [place], [place] [address] [phone]  [facility]  [address] [place] [address] [phone]  [facility] Behavioral Health  [place] [phone]  [facility]  [address], [place] [address] [phone]  [facility]  [address] [place] [address] [phone]  [facility]  [address] [place] [address] [phone]  [facility]  [address] [place]  [place] [address] [phone]    Situation ongoing, CSW to continue following and update chart as more information becomes available.   Harrie Sofia MSW, LCSWA 04/26/2024 8:18 PM

## 2024-04-26 NOTE — Progress Notes (Signed)
 Inpatient Psychiatric Referral  Patient was recommended inpatient per Kyra  Weber, NP . There [facility], per [facility]. Patient was referred to the following out of network facilities:  [facility] Provider Address Phone Fax  [facility]  [address] [place] [address] [phone]  [facility]  [address] [place] [address] [phone]  [facility]  [address] [place], [place] [phone]  [facility]  [address] [place] [address] [phone]  [facility]  [address] [place], [place] [address] [phone]  [facility]  [address] [place], [place] [address] [phone]  [facility]  [address] [place] [address] [phone]  [facility]  [address], [place] [address] [phone]  [facility]  [address] [place] [address] [phone]  [facility] Center[facility]Ger[facility]  [address] [place] [address] [phone]  [facility]  [address] [place], [place] [address] [phone]  [facility]  [address] [place] [address] [phone]  [facility] Behavioral Health  [place] [phone]  [facility]  [address], [place] [address] [phone]  [facility]  [address] [place] [address] [phone]  [facility]  [address] [place] [address] [phone]  [facility]  [address], [place]  [place] [address] [phone]  [facility]  [address] [place] [address] [phone]    Situation ongoing, CSW to continue following and update chart as more information becomes available.

## 2024-04-27 NOTE — Progress Notes (Signed)
 Patient has been denied by [facility] due to no appropriate beds available. Patient meets BH inpatient criteria per Efrain Patient, NP. Patient has been faxed out to the following facilities:   [facility]  [address] [place] [address] [phone]  [facility]  [address], [place] [address] [phone]  [facility] EFAX  [address] [place], [place] [phone]  [facility]  [address] [place] [address] [phone]  [facility]  [address] [place], [place] [address] [phone]  [facility]  [address], [place] [address] [phone]  [facility]  [address], [place] [address] [phone]  [facility]  [address] [place] [address] [phone]  [facility]  [address] [place] [address] [phone]  [facility]  [address] [place] [address] [phone]  [facility]  [address] [place], [place] [address] [phone]  [facility]  [address] [place] [address] [phone]  [facility] Behavioral Health  [place] [phone]  [facility]  [address] [place] [address] [phone]  [facility]  [address] [place] [address] [phone]  [facility]  [address] [place] [address] [phone]  [facility]  [address] [place] [address] [phone]  [facility]  [address] [place] [address] [phone]   Bunnie Gallop, MSW, LCSW-A  5:06 PM 04/27/2024

## 2024-04-27 NOTE — ED Provider Notes (Signed)
 Emergency Medicine Observation Re-evaluation Note  Deanna [name] is a 83 y.o. female, seen on rounds today.  Pt initially presented to the ED for complaints of Altered Mental Status Currently, the patient is resting and awaiting psychiatric placement.  Physical Exam  BP (!) 161/85 (BP Location: Right Arm)   Pulse (!) 107   Temp 98.4 F (36.9 C) (Oral)   Resp 20   Ht 1.549 m (5' 1)   Wt 74.8 kg   SpO2 93%   BMI 31.18 kg/m  Physical Exam General: No acute distress Cardiac: Blood pressures have ranged 106-161 systolically with heart rates ranging 94-1 07 Lungs: No respiratory distress with normal respiratory rate and sats recorded at 93 to 95% Psych: Patient sleeping at this time  ED Course / MDM  EKG:EKG Interpretation Date/Time:  Tuesday April 23 2024 19:37:49 EDT Ventricular Rate:  96 PR Interval:  172 QRS Duration:  100 QT Interval:  380 QTC Calculation: 481 R Axis:   25  Text Interpretation: Sinus rhythm Left ventricular hypertrophy Borderline T abnormalities, lateral leads Confirmed by Charlyn Sora [phone]) on 04/23/2024 11:53:15 PM  I have reviewed the labs performed to date as well as medications administered while in observation.  Recent changes in the last 24 hours include none.  Plan  Current plan is for patient awaiting psychiatric placement.    Levander Houston, MD 04/27/24 [phone]

## 2024-04-27 NOTE — ED Notes (Addendum)
 Pt pushing past the sitter to get to nurse stations, repeatedly asking same questions over and over and became verbally aggressive when it was pointed out what she was doing. Medicated with risperidone  for agitation. Symptoms of sun downing noted as pt is becoming increasingly verbal aggressive toward staff. Very focused on her purse and getting her car keys so she could leave in the AM. Request for additional pain medication sent to EDP d/t pt claim of pain left shoulder 5-6/10 scale.

## 2024-04-28 DIAGNOSIS — F03918 Unspecified dementia, unspecified severity, with other behavioral disturbance: Secondary | ICD-10-CM | POA: Insufficient documentation

## 2024-04-28 NOTE — ED Notes (Signed)
 Patient up sitting in chair resting quietly

## 2024-04-28 NOTE — Progress Notes (Signed)
 Patient has been denied by [facility] and has been faxed out. Patient meets BH inpatient criteria per Efrain Patient, NP. Patient has been faxed out to the following facilities:   [facility]  [address], [place] [address] [phone]  [facility]  [address] [place] [address] [phone]  [facility]FAX  [address] [place], [place] [phone]  [facility]  [address] [place] [address] [phone]  [facility]  [address] [place], [place] [address] [phone]  [facility]  [address], [place] [address] [phone]  [facility]  [address], [place] [address] [phone]  [facility]  [address], [place] [address] [phone]  [facility]  [address], [place] [address] [phone]  [facility]  [name] [place] [address] [phone]  [facility]  [address] [place], [place] [address] [phone]  [facility]  [address] [place] [address] [phone]  [facility] Behavioral Health  [place] [phone]  [facility]  [address] [place] [address] [phone]  [facility]  [address] [place] [address] [phone]  [facility]  [address] [place] [address] [phone]  [facility]  [address] [place] [address] [phone]  [facility]  [address] [place] [address] [phone]   Bunnie Gallop, MSW, LCSW-A  12:57 PM 04/28/2024

## 2024-04-28 NOTE — Consult Note (Cosign Needed Addendum)
 [facility] Consult Follow-up  Patient Name: .Deanna [name]  MRN: [id number]  DOB: 09-09-1940  Consult Order details:  Orders (From admission, onward)     Start     Ordered   04/23/24 2335  CONSULT TO CALL ACT TEAM       Ordering Provider: Charlyn Sora, MD  Provider:  (Not yet assigned)  Question:  Reason for Consult?  Answer:  Psych consult   04/23/24 2335             Mode of Visit: Tele-visit Virtual Statement:TELE PSYCHIATRY ATTESTATION & CONSENT As the provider for this telehealth consult, I attest that I verified the patient's identity using two separate identifiers, introduced myself to the patient, provided my credentials, disclosed my location, and performed this encounter via a HIPAA-compliant, real-time, face-to-face, two-way, interactive audio and video platform and with the full consent and agreement of the patient (or guardian as applicable.) Patient physical location: [facility]. Telehealth provider physical location: home office in state of [place] .   Video start time: 1423 Video end time: 1501    Psychiatry Consult Evaluation  Service Date: April 28, 2024 LOS:  LOS: 0 days  Chief Complaint Im' feeling okay today.  Primary Psychiatric Diagnoses  Dementia and Behavioral Disturbance Altered Mental Status   Assessment  Deanna [name] is a 83 y.o. female admitted: Presented to the ED 04/23/2024  7:25 PM brought in by daughter from home for confusion and aggression. She carries the psychiatric diagnoses of anxiety, depression, mood disorder and cognitive impairment and has a past medical history of MI, obesity, UTIs, stage 3b CKD, osteoarthritis, TBI, GERD, OSA, CVA, paroxysmal atrial fibrillation, CHF, HTN, carotid artery stenosis, multiple stents and a quadruple cardiac bypass.   Psychiatric Reassessment as of 04/28/2024: Since admission, she's had intermittent agitation but this is reasonable and anticipated with patients with history for  cognitive impairment.  Within the past 24 hours, her agitation has improved and she has not demonstrated verbal or physical aggression and has not required prn medications. She was restarted on home medications 4 days ago, depakote  250mg  po at bedtime, zoloft  was stopped and escitalopram  10mg  po daily was started; quetiapine  25mg  po at bedtime.  She has tolerated the medications well and does not reports GI concerns.  At baseline she has hand tremors, but no new or worse since admission; not obvious during assessment today.  No involuntary movements observed.  She reports her appetite and sleep are both good; I've been eating too much and getting fat.     Will plan to have valproic acid  levels completed in the AM; if she continues to do well, can plan to psych clear and discharge in the AM.   Diagnoses:  Active Hospital problems: Principal Problem:   Dementia with behavioral disturbance (HCC) Active Problems:   Altered mental status    Plan   ## Psychiatric Medication Recommendations:  Continue Home Medications --Seroquel  25mg  PO QHS --Lexapro  10mg  PO day with plan to increase to 20mg  Daily --Depakote  ER 250mg  PO QHS  ## Medical Decision Making Capacity: Not specifically addressed in this encounter  ## Further Work-up:  -- most recent EKG on 04/26/24 had QtC of  --Continue to monitor for prolonged QT intervals while she is on psychotropic medication. -- Pertinent labwork reviewed earlier this admission includes: CMP, CBC and UDS  ## Disposition:-- For now will continue with referral for admission; We recommend inpatient psychiatric hospitalization when medically cleared. Patient is under voluntary admission status  at this time; please IVC if attempts to leave hospital.  However, valproic acid  levels ordered for AM draw.  Patient has shown significant improvement in previously demonstrated psychosis.  Pending WNL labs, and contact with her daughter, can plan for psych clearance and  possible discharge on 9/22.    ## Behavioral / Environmental: -Delirium Precautions: Delirium Interventions for Nursing and Staff: - RN to open blinds every AM. - To Bedside: Glasses, hearing aide, and pt's own shoes. Make available to patients. when possible and encourage use. - Encourage po fluids when appropriate, keep fluids within reach. - OOB to chair with meals. - Passive ROM exercises to all extremities with AM & PM care. - RN to assess orientation to person, time and place QAM and PRN. - Recommend extended visitation hours with familiar family/friends as feasible. - Staff to minimize disturbances at night. Turn off television when pt asleep or when not in use.    ## Safety and Observation Level:  - Based on my clinical evaluation, I estimate the patient to be at low risk of self harm in the current setting. - At this time, we recommend  routine. This decision is based on my review of the chart including patient's history and current presentation, interview of the patient, mental status examination, and consideration of suicide risk including evaluating suicidal ideation, plan, intent, suicidal or self-harm behaviors, risk factors, and protective factors. This judgment is based on our ability to directly address suicide risk, implement suicide prevention strategies, and develop a safety plan while the patient is in the clinical setting. Please contact our team if there is a concern that risk level has changed.  CSSR Risk Category:C-SSRS RISK CATEGORY: No Risk  Suicide Risk Assessment: Patient has following modifiable risk factors for suicide: medication noncompliance, active mental illness (to encompass adhd, tbi, mania, psychosis, trauma reaction), and current symptoms: anxiety/panic, insomnia, impulsivity, anhedonia, hopelessness, which we are addressing by recommending inpatient psychiatric hospitalization. Patient has following non-modifiable or demographic risk factors for suicide:  none Patient has the following protective factors against suicide: Access to outpatient mental health care, Supportive family, and no history of suicide attempts  Thank you for this consult request. Recommendations have been communicated to the primary team.  We will continue to follow at this time.   Bernadette FORBES Barefoot, NP       History of Present Illness  Relevant Aspects of Hospital ED Course:  Admitted on 04/23/2024 for brought in by daughter from home for confusion and aggression. She carries the psychiatric diagnoses of anxiety, depression, mood disorder and cognitive impairment and has a past medical history of MI, obesity, UTIs, stage 3b CKD, osteoarthritis, TBI, GERD, OSA, CVA, paroxysmal atrial fibrillation, CHF, HTN, carotid artery stenosis, multiple stents and a quadruple cardiac bypass.    Interval Psychiatric Progress Note: 04/28/2024: Patient observed sitting up in a chair,fairly groomed and dressed in hospital gown.  When greeted by this writer, and given anticipatory guidance, she smiles and makes direct eye contact.  At baseline she has cognitive impairment and memory issues; Today she is alert and oriented to person, aware she's in the hospital but unsure of name, Is it Deanna [name] or the other one. She's also able to discuss the reason for admission.  Pt reports she here because of her medications.  She reports prior to admission she was feeling zucky but no longer feels that way.    Since admission, she has had intermittent agitation but this is reasonable and anticipated with  patients with dementia.  Within the past 24 hours, her agitation has improved and she has not demonstrated verbal or physical aggression and has not required prn medications. She was restarted on home medications, depakote  250mg  po at bedtime, zoloft  was stopped and escitalopram  10mg  po daily was started; quetiapine  25mg  po at bedtime.  She has tolerated the medications well and does not reports GI concerns.   At baseline she has hand tremors, but no new or worse since admission; not obvious during assessment today.  No involuntary movements observed.  She reports her appetite and sleep are both good; I've been eating too much and getting fat.     Will plan to complete valproic acid  levels completed in the AM; if she continue to do well, can plan to psych clear and discharge in the AM.   Patient Report 04/24/2024:  Deanna [name], is seen face to face by this provider, consulted with Dr. Larina; and chart reviewed on 04/24/24.  On evaluation Deanna [name] reports she doesn't know why she is here.  She knows who she is and that she is at [employer].  She does not know the month or year.  She is unable to articulate the situation.  She endorses memory problems.   Patient had a stroke 3 months ago.  She ran out her psychiatric medications a few weeks ago and was unable to get refills because they have been unable to find a new psychiatric provider after her previous provider left. The patients intermittent agitation and aggression has gotten worse and her sundowning behaviors are occurring more often. Patient is seeing a witch that is trying to make her drink wine in her room at night when sundowning and it scares her.  Patient and her daughter want patient reestablished on her medications and stabilized.  During evaluation Deanna [name] is laying in bed in no acute distress relying heavily on her daughter at bedside to answer questions.  She is alert & oriented x 2, calm, cooperative and attentive for this assessment.  Her mood is anxious with congruent affect.  She has normal speech, and behavior.  Objectively there is evidence of intermittent psychosis and delusional thinking. Pt does not appear to be responding to internal or external stimuli currently.  Patient is able to converse clearly; she is somewhat confused and is slightly hard of hearing.  She denies suicidal/self-harm/homicidal ideation; she  endorses intermittent psychosis, and paranoia.  Patient struggled to answer questions appropriately.    I personally spent a total of 60 minutes in the care of the patient today including preparing to see the patient, getting/reviewing separately obtained history, performing a medically appropriate exam/evaluation, counseling and educating, placing orders, referring and communicating with other health care professionals, documenting clinical information in the EHR, independently interpreting results, communicating results, and coordinating care.  Psych ROS:  Depression: pt no longer endorses today Anxiety: yes Mania (lifetime and current): denies Psychosis: (lifetime and current):her previously hallucinations have improved.   Collateral information:  Attempted to contact Deanna [name] at [phone] to review plan of care.  She was unavailable a discreet message was left.   Review of Systems  Constitutional: Negative.   HENT:  Positive for hearing loss.   Eyes: Negative.   Gastrointestinal: Negative.   Genitourinary: Negative.   Musculoskeletal: Negative.   Skin: Negative.   Neurological:  Positive for weakness.  Endo/Heme/Allergies: Negative.   Psychiatric/Behavioral:  Positive for memory loss. The patient is nervous/anxious.  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 rate 18, height 5' 1 (1.549 m), weight 74.8 kg, SpO2 97%. Body mass index is 31.18 kg/m.  Physical Exam Vitals and nursing note reviewed.  Constitutional:      Appearance: She is obese.  Eyes:     Pupils: Pupils are equal, round, and reactive to light.  Pulmonary:     Effort: Pulmonary effort is normal.  Musculoskeletal:     Cervical back: Normal range of motion.  Skin:    General: Skin is dry.  Neurological:     Mental Status: She is alert. Mental status is at baseline.  Psychiatric:        Attention and Perception: Attention and perception normal. She does not perceive auditory or visual hallucinations.        Mood and Affect: Affect normal. Mood is anxious.        Speech: Speech normal.        Behavior: Behavior normal. Behavior is cooperative.        Thought Content: Thought content normal.        Cognition and Memory: Cognition is impaired (at baseline pt has dementia). Memory is impaired.        Judgment: Judgment is impulsive (at baseline, pt is impulsive d/t cognitive impairment).     Mental Status Exam: General Appearance: Casual and Fairly Groomed  Orientation:  Other:  person and place and aware why she's in the hospital  Memory:  Immediate;   Fair Recent;   Poor Remote;   Poor at baseline pt has dementia so prone to memory loss  Concentration:  Concentration: Poor  Recall:  Fair   Attention  Fair  Eye Contact:  Fair  Speech:  Clear  Language:  Good  Volume:  Normal  Mood: I'm okay   Affect:  Congruent but mildly anxious  Thought Process:  Coherent, Goal Directed, and NA  Thought Content:  Rumination  Suicidal Thoughts:  No  Homicidal Thoughts:  No  Judgement:  Impaired  Insight:  Lacking  Psychomotor Activity:  Normal  Akathisia:  No  Fund of Knowledge:  Poor      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  Family History:  The patient's family history includes Anxiety disorder in her daughter and sister; Anxiety disorder (age of onset: 7) in her sister; Cerebral palsy (age of onset: 63) in her sister; Colon cancer (age of onset: 78) in her brother; Congestive Heart Failure (age of onset: 12) in her sister; Dementia in her sister; Depression in her daughter; Diabetes in her father; Heart Problems in her sister; Heart Problems (age of onset: 35) in her sister; Heart attack in her mother; Heart attack (age of onset: 50) in her father; Heart attack (age of onset: 35) in her brother and sister; Heart attack (age of onset: 43) in her sister; Heart disease in her father and mother; Hyperlipidemia in her father; Hypertension in her daughter and father; Hypertension (age of onset: 48) in her sister; Irritable bowel syndrome in her daughter; Prostate cancer (age of onset: 103) in her brother; Stroke in her sister.  Medical History: Past Medical History:  Diagnosis Date   Anxiety    Bacteremia, escherichia coli 04/27/2015   Brachial-basilar insufficiency syndrome    CAD (coronary artery disease)    Celiac artery stenosis (HCC)    Chronic bilateral low back pain without sciatica    Chronic combined systolic (congestive) and diastolic (congestive) heart failure  (HCC) 11/14/2017   CKD (chronic kidney disease), stage IV (HCC)    Cognitive impairment    Colon polyps    Community acquired pneumonia    Depression    Diverticulosis    Dysuria    Encephalomalacia    GAD (generalized anxiety disorder)    GERD (gastroesophageal reflux disease)    Glaucoma    Hearing loss    Hemorrhoids    Hypertension    Hypothyroidism    Incontinence    Mixed hyperlipidemia    Myocardial infarction (HCC)    NSTEMI (non-ST elevated myocardial infarction) (HCC) 12/19/2011   OSA on CPAP    PAF (paroxysmal atrial fibrillation) (HCC)    Peripheral arterial disease (HCC)    Pneumonitis 04/26/2015   Pyelonephritis due to Escherichia coli 04/28/2015   Sepsis (HCC)    Spondylosis of lumbar region without myelopathy or radiculopathy    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HCC)    Urticaria    Vertigo, benign positional     Surgical History: Past Surgical History:  Procedure Laterality Date   ABDOMINAL AORTOGRAM W/LOWER EXTREMITY N/A 08/22/2017   Procedure: ABDOMINAL AORTOGRAM W/LOWER EXTREMITY;  Surgeon: Serene Gaile ORN, MD;  Location: [facility] INVASIVE CV LAB;  Service: Cardiovascular;  Laterality: N/A;   BILATERAL UPPER EXTREMITY ANGIOGRAM N/A 09/11/2012   Procedure: BILATERAL UPPER EXTREMITY ANGIOGRAM;  Surgeon: Gaile ORN Serene, MD;  Location: [facility] CATH LAB;  Service: Cardiovascular;  Laterality: N/A;   BIOPSY  03/25/2020   Procedure: BIOPSY;  Surgeon: Wilhelmenia Aloha Raddle., MD;  Location: [facility] ENDOSCOPY;  Service: Gastroenterology;;   carotid duplex doppler Bilateral 09/03/2012, 11/03/2011   Evidence of 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 patent left common carotid- subclavian artery bypass with turbulent flow noted at the anastomosis with velocities of 295 cm/s   CAROTID-SUBCLAVIAN BYPASS GRAFT  12/15/2011   Procedure: BYPASS GRAFT  CAROTID-SUBCLAVIAN;  Surgeon: Gaile ORN Serene, MD;  Location: [facility] OR;  Service: Vascular;  Laterality: Left;  Left Carotid subclavian bypass   CORONARY ANGIOPLASTY     CORONARY ARTERY BYPASS GRAFT     CORONARY ATHERECTOMY N/A  03/19/2020   Procedure: CORONARY ATHERECTOMY;  Surgeon: Dann Candyce RAMAN, MD;  Location: [facility] INVASIVE CV LAB;  Service: Cardiovascular;  Laterality: N/A;   CORONARY STENT INTERVENTION N/A 03/19/2020   Procedure: CORONARY STENT INTERVENTION;  Surgeon: Dann Candyce RAMAN, MD;  Location: [facility] INVASIVE CV LAB;  Service: Cardiovascular;  Laterality: N/A;   DOPPLER ECHOCARDIOGRAPHY  05/27/2010, 09/17/2008   Mild Proximal septal thickening is noted. Left ventricular systolic functions is normal ejection fraction =>55%. the aortic valve appears to be mildly sclerotic    ENDARTERECTOMY Right 12/13/2023   Procedure: ENDARTERECTOMY, CAROTID;  Surgeon: Serene Gaile ORN, MD;  Location: [facility] OR;  Service: Vascular;  Laterality: Right;   ESOPHAGOGASTRODUODENOSCOPY (EGD) WITH PROPOFOL  N/A 03/25/2020   Procedure: ESOPHAGOGASTRODUODENOSCOPY (EGD) WITH PROPOFOL ;  Surgeon: Wilhelmenia Aloha Raddle., MD;  Location: [facility] ENDOSCOPY;  Service: Gastroenterology;  Laterality: N/A;   fem-fem bypass graft  1999   holter monitor  01/21/2008   The predominant rhythm was normal sinus rhythm. Minimum heartrate of 63 bpm at +01:00, maximum heartrate of 105 bpm at + 10:35; and the average heartrate of 75 bpm. Ventricular ectopic activity totaled 1270: Multifocal; 866-PVC's and 404-VEs              JOINT REPLACEMENT     Left knee   LEFT HEART CATH AND CORS/GRAFTS ANGIOGRAPHY N/A 03/19/2020   Procedure: LEFT HEART CATH AND CORS/GRAFTS ANGIOGRAPHY;  Surgeon: Dann Candyce RAMAN, MD;  Location: [facility] INVASIVE CV LAB;  Service: Cardiovascular;  Laterality: N/A;   LEFT HEART CATHETERIZATION WITH CORONARY/GRAFT ANGIOGRAM N/A 12/21/2011   Procedure: LEFT HEART CATHETERIZATION WITH EL BILE;  Surgeon: Alm ORN Clay, MD;  Location[facility] CATH LAB;  Service: Cardiovascular;  Laterality: N/A;   NM MYOCAR PERF EJECTION FRACTION  09/22/2009, 07/03/2007   the post stress myocardial perfusion images show a normal pattern of perfusion is all regions. The post-stress ejection fraction is 68 %. no significant wall motion abnormalities noted. This is a low risk scan.   PATCH ANGIOPLASTY Right 12/13/2023   Procedure: ANGIOPLASTY, USING PATCH GRAFT;  Surgeon: Serene Gaile ORN, MD;  Location[facility] OR;  Service: Vascular;  Laterality: Right;   PERIPHERAL VASCULAR INTERVENTION  08/22/2017   Procedure: PERIPHERAL VASCULAR INTERVENTION;  Surgeon: Serene Gaile ORN, MD;  Location: [facility] INVASIVE CV LAB;  Service: Cardiovascular;;  Fem/Fem Graft   REPLACEMENT TOTAL KNEE  05-2011   UNILATERAL UPPER EXTREMEITY ANGIOGRAM Left 11/15/2011   Procedure: UNILATERAL UPPER VENITA BILE;  Surgeon: Dorn JINNY Lesches, MD;  Location: [facility] CATH LAB;  Service: Cardiovascular;  Laterality: Left;     Medications:   Current Facility-Administered Medications:    acetaminophen  (TYLENOL ) tablet 650 mg, 650 mg, Oral, Q4H PRN, Nanavati, Ankit, MD, 650 mg at 04/27/24 2137   divalproex  (DEPAKOTE  ER) 24 hr tablet 250 mg, 250 mg, Oral, QHS, Weber, Kyra A, NP, 250 mg at 04/27/24 2136   escitalopram  (LEXAPRO ) tablet 10 mg, 10 mg, Oral, Daily, Weber, Kyra A, NP, 10 mg at 04/28/24 1024   QUEtiapine  (SEROQUEL ) tablet 25 mg, 25 mg, Oral, QHS, Weber, Kyra A, NP, 25 mg at 04/27/24 2136   risperiDONE  (RISPERDAL  M-TABS) disintegrating tablet 2 mg, 2 mg, Oral, Q8H PRN, 2 mg at 04/27/24 2136 **AND** [COMPLETED] LORazepam  (ATIVAN ) tablet 1 mg, 1 mg, Oral, PRN, 1 mg at 04/27/24 2137 **AND** [COMPLETED] ziprasidone  (GEODON ) injection 20 mg, 20 mg, Intramuscular, PRN, Nanavati, Ankit, MD, 20 mg at 04/24/24 0020  Current Outpatient Medications:    albuterol  (VENTOLIN  HFA) 108 (90 Base) MCG/ACT  inhaler, Inhale 2 puffs into the lungs every 6 (six) hours as needed for  wheezing or shortness of breath., Disp: 8 g, Rfl: 2   ALPRAZolam  (XANAX ) 0.5 MG tablet, Take 1 tablet (0.5 mg total) by mouth daily as needed for up to 2 doses for anxiety., Disp: 2 tablet, Rfl: 0   aspirin  EC 81 MG tablet, Take 81 mg by mouth at bedtime., Disp: , Rfl:    atorvastatin  (LIPITOR) 40 MG tablet, Take 1 tablet (40 mg total) by mouth daily., Disp: , Rfl:    busPIRone  (BUSPAR ) 15 MG tablet, Take 1 tablet (15 mg total) by mouth 2 (two) times daily., Disp: 180 tablet, Rfl: 1   Cholecalciferol  (VITAMIN D3) 1.25 MG (50000 UT) CAPS, Take 1 capsule by mouth once a week., Disp: , Rfl:    clopidogrel  (PLAVIX ) 75 MG tablet, TAKE 1 TABLET(75 MG) BY MOUTH DAILY (Patient taking differently: Take 75 mg by mouth at bedtime.), Disp: 90 tablet, Rfl: 3   Cyanocobalamin  (VITAMIN B-12 PO), Take 1 tablet by mouth in the morning., Disp: , Rfl:    divalproex  (DEPAKOTE  ER) 250 MG 24 hr tablet, Take 1 tablet (250 mg total) by mouth at bedtime., Disp: 90 tablet, Rfl: 1   dorzolamide  (TRUSOPT ) 2 % ophthalmic solution, Place 1 drop into both eyes in the morning and at bedtime., Disp: , Rfl:    escitalopram  (LEXAPRO ) 20 MG tablet, Take 1 tablet (20 mg total) by mouth daily., Disp: 90 tablet, Rfl: 1   ezetimibe  (ZETIA ) 10 MG tablet, TAKE 1 TABLET(10 MG) BY MOUTH DAILY, Disp: 90 tablet, Rfl: 1   furosemide  (LASIX ) 40 MG tablet, Take 1 tablet (40 mg total) by mouth daily as needed for fluid or edema., Disp: , Rfl:    ketoconazole  (NIZORAL ) 2 % shampoo, APPLY TOPICALLY 2 TIMES A WEEK., Disp: 120 mL, Rfl: 5   levothyroxine  (SYNTHROID ) 150 MCG tablet, Take 1 tablet (150 mcg total) by mouth daily at 6 (six) AM., Disp: , Rfl:    meclizine  (ANTIVERT ) 25 MG tablet, Take 1 tablet (25 mg total) by mouth as needed for dizziness. (Patient taking differently: Take 25 mg by mouth 3 (three) times daily as needed for dizziness.), Disp: 30 tablet, Rfl: 0   metoprolol  succinate (TOPROL -XL) 25 MG 24 hr tablet, Take 1 tablet (25 mg  total) by mouth daily., Disp: 90 tablet, Rfl: 1   nitroGLYCERIN  (NITROSTAT ) 0.4 MG SL tablet, Place 1 tablet (0.4 mg total) under the tongue every 5 (five) minutes x 3 doses as needed for chest pain., Disp: 25 tablet, Rfl: 1   pantoprazole  (PROTONIX ) 40 MG tablet, Take 1 tablet (40 mg total) by mouth daily., Disp: 90 tablet, Rfl: 1   QUEtiapine  (SEROQUEL ) 25 MG tablet, Take 1 tablet (25 mg total) by mouth at bedtime. Would wean as tolerated, Disp: , Rfl:    TYLENOL  500 MG tablet, Take 500-1,000 mg by mouth 2 (two) times daily., Disp: , Rfl:   Allergies: Allergies  Allergen Reactions   Lidocaine  Swelling and Other (See Comments)    Use CORRECT dose for age/weight- red welts and tongue swelling result, if not   Cymbalta  [Duloxetine  Hcl] Other (See Comments)    Increased confusion and memory concerns    Donepezil Other (See Comments)    Altered mood and Aggression     Mobic [Meloxicam] Other (See Comments)    Heart failure- cannot take   Namenda [Memantine] Other (See Comments)    Severe aggression   Nsaids Other (  See Comments)    Cannot have due to heart issues   Other Other (See Comments)    No salt or anything that might cause fluid retention/swelling!!   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

## 2024-04-28 NOTE — ED Provider Notes (Signed)
 Emergency Medicine Observation Re-evaluation Note  Deanna [name] is a 83 y.o. female, seen on rounds today.  Pt initially presented to the ED for complaints of Altered Mental Status Currently, the patient is resting   Physical Exam  BP 133/66 (BP Location: Right Arm)   Pulse 78   Temp 97.8 F (36.6 C) (Oral)   Resp 18   Ht 5' 1 (1.549 m)   Wt 74.8 kg   SpO2 92%   BMI 31.18 kg/m  Physical Exam General: nad Cardiac: rr Lungs: non labored Psych: calm   ED Course / MDM  EKG:EKG Interpretation Date/Time:  Tuesday April 23 2024 19:37:49 EDT Ventricular Rate:  96 PR Interval:  172 QRS Duration:  100 QT Interval:  380 QTC Calculation: 481 R Axis:   25  Text Interpretation: Sinus rhythm Left ventricular hypertrophy Borderline T abnormalities, lateral leads Confirmed by Charlyn Sora [phone]) on 04/23/2024 11:53:15 PM  I have reviewed the labs performed to date as well as medications administered while in observation.  Recent changes in the last 24 hours include none.  Plan  Current plan is for psych placement.    Neysa Caron PARAS, DO 04/28/24 1429

## 2024-04-29 ENCOUNTER — Ambulatory Visit: Admitting: Nurse Practitioner

## 2024-04-29 DIAGNOSIS — F03918 Unspecified dementia, unspecified severity, with other behavioral disturbance: Secondary | ICD-10-CM

## 2024-04-29 LAB — VALPROIC ACID LEVEL: Valproic Acid Lvl: 23 ug/mL — ABNORMAL LOW (ref 50–100)

## 2024-04-29 MED ORDER — ASPIRIN 81 MG PO TBEC
81.0000 mg | DELAYED_RELEASE_TABLET | Freq: Every day | ORAL | Status: DC
  Administered 2024-04-29: 81 mg via ORAL
  Filled 2024-04-29: qty 1

## 2024-04-29 MED ORDER — CLOPIDOGREL BISULFATE 75 MG PO TABS
75.0000 mg | ORAL_TABLET | Freq: Every day | ORAL | Status: DC
  Administered 2024-04-29: 75 mg via ORAL
  Filled 2024-04-29: qty 1

## 2024-04-29 MED ORDER — ASPIRIN 81 MG PO TBEC: 81.0000 mg | DELAYED_RELEASE_TABLET | Freq: Once | ORAL | Status: DC

## 2024-04-29 MED ORDER — ATORVASTATIN CALCIUM 40 MG PO TABS
40.0000 mg | ORAL_TABLET | Freq: Every day | ORAL | Status: DC
Start: 2024-04-29 — End: 2024-04-29
  Administered 2024-04-29: 40 mg via ORAL
  Filled 2024-04-29: qty 1

## 2024-04-29 NOTE — ED Notes (Signed)
 Patient standing in room and redirected to sit back in chair for her safety.

## 2024-04-29 NOTE — ED Notes (Signed)
 Patient up sitting in chair at this time yelling please help me, please help me while staring at the tech. Patient redirected not to yell on the unit while others are resting.

## 2024-04-29 NOTE — ED Notes (Signed)
 Patient resting in bed breathing eyes closed chest rising and falling.

## 2024-04-29 NOTE — ED Notes (Signed)
 Patient sitting up in chair asking can you take me to the back door please

## 2024-04-29 NOTE — ED Notes (Signed)
 Patient sitting in room yelling please help us  Nurse redirected patient at this time.

## 2024-04-29 NOTE — ED Notes (Signed)
 Patient sitting in chair in room at this time. Patient assisted to the restroom.

## 2024-04-29 NOTE — Consult Note (Signed)
 [facility] Consult Follow-up  Patient Name: .Deanna [name]  MRN: [id number]  DOB: [birth year]  Consult Order details:  Orders (From admission, onward)     Start     Ordered   04/23/24 2335  CONSULT TO CALL ACT TEAM       Ordering Provider: Charlyn Sora, MD  Provider:  (Not yet assigned)  Question:  Reason for Consult?  Answer:  Psych consult   04/23/24 2335             Mode of Visit: Face to face     Psychiatry Consult Evaluation  Service Date: April 29, 2024 LOS:  LOS: 0 days  Chief Complaint Im' feeling good.  Primary Psychiatric Diagnoses  Dementia and Behavioral Disturbance Altered Mental Status   Assessment  Deanna [name] is a 83 y.o. female admitted: Presented to the ED 04/23/2024  7:25 PM brought in by daughter from home for confusion and aggression. She carries the psychiatric diagnoses of anxiety, depression, mood disorder and cognitive impairment and has a past medical history of MI, obesity, UTIs, stage 3b CKD, osteoarthritis, TBI, GERD, OSA, CVA, paroxysmal atrial fibrillation, CHF, HTN, carotid artery stenosis, multiple stents and a quadruple cardiac bypass.   83 year old female with dementia presenting for worsening confusion and aggression. Over the past 48 hours, agitation and aggression have significantly improved with consistent medication compliance. No PRN medications have been required. She is safe for discharge back home or to a higher level of care, pending family's decision and transitional care planning.   Diagnoses:  Active Hospital problems: Principal Problem:   Dementia with behavioral disturbance (HCC) Active Problems:   Altered mental status    Plan   ## Psychiatric Medication Recommendations:  Psychiatric Clearance: Patient is psychiatrically stable and does not meet inpatient psychiatric admission criteria at this time. No evidence of imminent danger to self or others.  Medications: Continue current  regimen: Depakote  250 mg PO at bedtime Lexapro  10 mg PO daily Seroquel  25 mg PO at bedtime Prescriptions to be sent to the pharmacy. Monitor monthly valproic acid  levels due to Depakote  use.  Discharge Planning: Transitional Care Team to assist with memory care placement and Medicaid application process.  Daughter provided resources and education regarding next steps.  Behavioral health outpatient follow-up recommended once a new therapist is established.  Follow-Up: Return to ED if new or worsening aggression, agitation, confusion, or safety concerns arise.  ## Medical Decision Making Capacity: Not specifically addressed in this encounter   ## Disposition:-- Patient is psychiatrically stable and does not meet inpatient psychiatric admission criteria at this time.    ## Behavioral / Environmental: -Delirium Precautions: Delirium Interventions for Nursing and Staff: - RN to open blinds every AM. - To Bedside: Glasses, hearing aide, and pt's own shoes. Make available to patients. when possible and encourage use. - Encourage po fluids when appropriate, keep fluids within reach. - OOB to chair with meals. - Passive ROM exercises to all extremities with AM & PM care. - RN to assess orientation to person, time and place QAM and PRN. - Recommend extended visitation hours with familiar family/friends as feasible. - Staff to minimize disturbances at night. Turn off television when pt asleep or when not in use.    ## Safety and Observation Level:  - Based on my clinical evaluation, I estimate the patient to be at low risk of self harm in the current setting. - At this time, we recommend  routine. This decision is based on  my review of the chart including patient's history and current presentation, interview of the patient, mental status examination, and consideration of suicide risk including evaluating suicidal ideation, plan, intent, suicidal or self-harm behaviors, risk factors, and protective  factors. This judgment is based on our ability to directly address suicide risk, implement suicide prevention strategies, and develop a safety plan while the patient is in the clinical setting. Please contact our team if there is a concern that risk level has changed.  CSSR Risk Category:C-SSRS RISK CATEGORY: No Risk  Suicide Risk Assessment: Patient has following modifiable risk factors for suicide: active mental illness (to encompass adhd, tbi, mania, psychosis, trauma reaction), and current symptoms: anxiety/panic, insomnia, impulsivity, anhedonia, hopelessness, which we are addressing by recommending outpatient follow up with therapist at [facility], safety planning and assisting caregiver with resources to Memory Care. Patient has following non-modifiable or demographic risk factors for suicide: none Patient has the following protective factors against suicide: Access to outpatient mental health care, Supportive family, and no history of suicide attempts  Thank you for this consult request. Recommendations have been communicated to the primary team.  We will continue to follow at this time.   Deanna [name], PMHNP       History of Present Illness  Relevant Aspects of Hospital ED Course:  Admitted on 04/23/2024 for brought in by daughter from home for confusion and aggression. 83 year old female with dementia presenting for worsening confusion and aggression. Over the past 48 hours, agitation and aggression have significantly improved with consistent medication compliance. No PRN medications have been required. She is safe for discharge back home or to a higher level of care, pending family's decision and transitional care planning.   Interval Psychiatric Progress Note: 04/28/2024: On today's evaluation, the patient is calm, pleasant, and cooperative, sitting up in bed eating breakfast and smiling. She denies SI/HI/AVH. She reports her appetite and sleep are good. Patient remains  confused, believing the year is 84, that the president is Charlie Bras, and that she is 83 years old. She is aware that she has a daughter and is able to state that she lives with her daughter, but she remains unsure of her reason for admission or current location.  Family initially reported that the patient was more confused and aggressive than normal prior to ED arrival. Since admission, she has had intermittent agitation, which is consistent with dementia. However, in the past 48 hours, she has not demonstrated any verbal or physical aggression and has not required PRN medications for agitation.  Psych ROS:  Depression: pt no longer endorses today Anxiety: yes Mania (lifetime and current): denies Psychosis: (lifetime and current):her previously hallucinations have improved.   Collateral information:  [name] Glymph-Young at [phone] Spoke with the patient's daughter (primary caregiver) with the patient's permission. Daughter reports that the patient has been confused, paranoid, and delusional, frequently talking about "taking a trip." Patient has been demeaning toward her daughter, and there are frequent arguments, especially involving the patient's 39 year old boyfriend, whom the patient believes her daughter is interfering with. Daughter reports that caring for the patient has impacted her own mental health. Both mother and daughter previously saw a therapist, but the therapist left [facility], and the patient has not received therapy for ~3 months. Daughter has been providing care for 10 years but is now considering placement in a memory care facility due to caregiver burnout. Behavioral Health Coordinator provided resources and a transitional care consult was placed to assist with memory care placement and Medicaid application  process.   Review of Systems  Constitutional: Negative.   HENT:  Positive for hearing loss.   Eyes: Negative.   Gastrointestinal: Negative.    Genitourinary: Negative.   Musculoskeletal: Negative.   Skin: Negative.   Neurological:  Positive for weakness.  Endo/Heme/Allergies: Negative.   Psychiatric/Behavioral:  Positive for memory loss. The patient is nervous/anxious.      Psychiatric and Social History  Psychiatric History:  Information collected from patient and daughter   Prev Dx/Sx: anxiety, depression, mood disorder and cognitive impairment  Current Psych Provider: none Home Meds (current): depakote , lexapro , seroquel  Previous Med Trials: unknown  Therapy: none  Prior Psych Hospitalization: denies Prior Self Harm: denies Prior Violence: denies  Family Psych History: Sister - anxiety, depression, dementia; Daughter - depression Family Hx suicide: unknown  Social History:  Developmental Hx: WNL Educational Hx: 1 year of college Occupational Hx: Retired, 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 rate 18, height 5' 1 (1.549 m), weight 74.8 kg, SpO2 94%. Body mass index is 31.18 kg/m.  Physical Exam Vitals and nursing note reviewed.  Constitutional:      Appearance: She is obese.  Eyes:     Pupils: Pupils are equal, round, and reactive to light.  Pulmonary:     Effort: Pulmonary effort is normal.  Musculoskeletal:     Cervical back: Normal range of motion.  Skin:    General: Skin is dry.  Neurological:      Mental Status: She is alert.  Psychiatric:        Attention and Perception: Attention normal.        Mood and Affect: Affect normal. Mood is anxious.        Speech: Speech normal.        Behavior: Behavior normal. Behavior is actively hallucinating. Behavior is cooperative.        Cognition and Memory: Cognition is impaired (at baseline pt has dementia). Memory is impaired.        Judgment: Judgment is impulsive (at baseline, pt is impulsive d/t cognitive impairment).     Mental Status Exam: General Appearance: Casual and Fairly Groomed  Orientation:  Other:  person and place and aware why she's in the hospital  Memory:  Immediate;   Fair Recent;   Poor Remote;   Poor at baseline pt has dementia so prone to memory loss  Concentration:  Concentration: Poor  Recall:  Fair  Attention  Fair  Eye Contact:  Fair  Speech:  Clear  Language:  Good  Volume:  Normal  Mood: I'm okay   Affect:  Congruent but mildly anxious  Thought Process:  Coherent, Goal Directed, and NA  Thought Content:  Rumination  Suicidal Thoughts:  No  Homicidal Thoughts:  No  Judgement:  Impaired  Insight:  Lacking  Psychomotor Activity:  Normal  Akathisia:  No  Fund of Knowledge:  Poor  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  Family History:  The patient's family history includes Anxiety disorder in her daughter and sister; Anxiety disorder (age of onset: 43) in her sister; Cerebral palsy (age of onset: 33) in her sister; Colon cancer (age of onset: 14) in her brother; Congestive Heart Failure (age of onset: 31) in her sister; Dementia in her sister; Depression in her daughter; Diabetes in her father; Heart Problems in her sister; Heart Problems (age of onset: 90) in her sister; Heart attack in  her mother; Heart attack (age of onset: 19) in her father; Heart attack (age of onset: 42) in her brother and sister; Heart attack (age of onset: 18) in her sister; Heart disease in her father and mother; Hyperlipidemia in her father; Hypertension in her daughter and father; Hypertension (age of onset: 66) in her sister; Irritable bowel syndrome in her daughter; Prostate cancer (age of onset: 66) in her brother; Stroke in her sister.  Medical History: 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   CKD (chronic kidney disease), stage IV (HCC)    Cognitive impairment    Colon polyps    Community acquired pneumonia    Depression    Diverticulosis    Dysuria    Encephalomalacia    GAD (generalized anxiety disorder)    GERD (gastroesophageal reflux disease)    Glaucoma    Hearing loss    Hemorrhoids    Hypertension    Hypothyroidism    Incontinence    Mixed hyperlipidemia    Myocardial infarction (HCC)    NSTEMI (non-ST elevated myocardial infarction) (HCC) 12/19/2011   OSA on CPAP    PAF (paroxysmal atrial fibrillation) (HCC)    Peripheral arterial disease    Pneumonitis 04/26/2015   Pyelonephritis due to Escherichia coli 04/28/2015   Sepsis (HCC)    Spondylosis of lumbar region without myelopathy or radiculopathy    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HCC)    Urticaria    Vertigo, benign positional     Surgical History: Past Surgical History:  Procedure Laterality Date   ABDOMINAL AORTOGRAM W/LOWER EXTREMITY N/A 08/22/2017   Procedure: ABDOMINAL AORTOGRAM W/LOWER EXTREMITY;  Surgeon: Serene Gaile ORN, MD;  Location: [facility] INVASIVE CV LAB;  Service: Cardiovascular;  Laterality: N/A;    BILATERAL UPPER EXTREMITY ANGIOGRAM N/A 09/11/2012   Procedure: BILATERAL UPPER EXTREMITY ANGIOGRAM;  Surgeon: Gaile ORN Serene, MD;  Location: [facility] CATH LAB;  Service: Cardiovascular;  Laterality: N/A;   BIOPSY  03/25/2020   Procedure: BIOPSY;  Surgeon: Wilhelmenia Aloha Raddle., MD;  Location: [facility] ENDOSCOPY;  Service: Gastroenterology;;   carotid duplex doppler Bilateral 09/03/2012, 11/03/2011   Evidence of 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 patent left common carotid- subclavian artery bypass with turbulent flow noted at the anastomosis with velocities of 295 cm/s   CAROTID-SUBCLAVIAN BYPASS GRAFT  12/15/2011   Procedure: BYPASS GRAFT  CAROTID-SUBCLAVIAN;  Surgeon: Gaile LELON New, MD;  Location: [facility] OR;  Service: Vascular;  Laterality: Left;  Left Carotid subclavian bypass   CORONARY ANGIOPLASTY     CORONARY ARTERY BYPASS GRAFT     CORONARY ATHERECTOMY N/A 03/19/2020   Procedure: CORONARY ATHERECTOMY;  Surgeon: Dann Candyce RAMAN, MD;  Location: [facility] INVASIVE CV LAB;  Service: Cardiovascular;  Laterality: N/A;   CORONARY STENT INTERVENTION N/A 03/19/2020   Procedure: CORONARY STENT INTERVENTION;  Surgeon: Dann Candyce RAMAN, MD;  Location: [facility] INVASIVE CV LAB;  Service: Cardiovascular;  Laterality: N/A;   DOPPLER ECHOCARDIOGRAPHY  05/27/2010, 09/17/2008   Mild Proximal septal thickening is noted. Left ventricular systolic functions is normal ejection fraction =>55%. the aortic valve appears to be mildly sclerotic    ENDARTERECTOMY Right 12/13/2023   Procedure: ENDARTERECTOMY, CAROTID;  Surgeon: New Gaile LELON, MD;  Location: [facility] OR;  Service: Vascular;  Laterality: Right;   ESOPHAGOGASTRODUODENOSCOPY (EGD) WITH PROPOFOL  N/A 03/25/2020   Procedure: ESOPHAGOGASTRODUODENOSCOPY (EGD) WITH PROPOFOL ;  Surgeon: Wilhelmenia Aloha Raddle., MD;  Location: [facility] ENDOSCOPY;   Service: Gastroenterology;  Laterality: N/A;   fem-fem bypass graft  1999   holter monitor  01/21/2008   The predominant rhythm was normal sinus rhythm. Minimum heartrate of 63 bpm at +01:00, maximum heartrate of 105 bpm at + 10:35; and the average heartrate of 75 bpm. Ventricular ectopic activity totaled 1270: Multifocal; 866-PVC's and 404-VEs              JOINT REPLACEMENT     Left knee   LEFT HEART CATH AND CORS/GRAFTS ANGIOGRAPHY N/A 03/19/2020   Procedure: LEFT HEART CATH AND CORS/GRAFTS ANGIOGRAPHY;  Surgeon: Dann Candyce RAMAN, MD;  Location: [facility] INVASIVE CV LAB;  Service: Cardiovascular;  Laterality: N/A;   LEFT HEART CATHETERIZATION WITH CORONARY/GRAFT ANGIOGRAM N/A 12/21/2011   Procedure: LEFT HEART CATHETERIZATION WITH EL BILE;  Surgeon: Alm LELON Clay, MD;  Location: [facility] CATH LAB;  Service: Cardiovascular;  Laterality: N/A;   NM MYOCAR PERF EJECTION FRACTION  09/22/2009, 07/03/2007   the post stress myocardial perfusion images show a normal pattern of perfusion is all regions. The post-stress ejection fraction is 68 %. no significant wall motion abnormalities noted. This is a low risk scan.   PATCH ANGIOPLASTY Right 12/13/2023   Procedure: ANGIOPLASTY, USING PATCH GRAFT;  Surgeon: New Gaile LELON, MD;  Location: [facility] OR;  Service: Vascular;  Laterality: Right;   PERIPHERAL VASCULAR INTERVENTION  08/22/2017   Procedure: PERIPHERAL VASCULAR INTERVENTION;  Surgeon: New Gaile LELON, MD;  Location: [facility] INVASIVE CV LAB;  Service: Cardiovascular;;  Fem/Fem Graft   REPLACEMENT TOTAL KNEE  05-2011   UNILATERAL UPPER EXTREMEITY ANGIOGRAM Left 11/15/2011   Procedure: UNILATERAL UPPER VENITA BILE;  Surgeon: Dorn JINNY Lesches, MD;  Location: [facility] CATH LAB;  Service: Cardiovascular;  Laterality: Left;     Medications:   Current Facility-Administered Medications:    acetaminophen  (TYLENOL ) tablet 650 mg, 650 mg, Oral, Q4H PRN, Nanavati, Ankit, MD, 650 mg at 04/27/24 2137    aspirin  EC tablet 81 mg, 81 mg, Oral, Daily, Ula Prentice SAUNDERS, MD, 81 mg at 04/29/24 0900   atorvastatin  (LIPITOR) tablet 40 mg, 40 mg, Oral, Daily, Ula Prentice SAUNDERS, MD, 40 mg at 04/29/24 0900   clopidogrel  (PLAVIX ) tablet 75 mg, 75 mg, Oral, Daily, Ula Prentice SAUNDERS, MD, 75 mg at 04/29/24 0900   divalproex  (DEPAKOTE  ER) 24 hr tablet 250 mg, 250 mg, Oral, QHS, Weber, Kyra A, NP, 250 mg at 04/28/24 2205   escitalopram  (LEXAPRO ) tablet 10  mg, 10 mg, Oral, Daily, Weber, Kyra A, NP, 10 mg at 04/29/24 0900   QUEtiapine  (SEROQUEL ) tablet 25 mg, 25 mg, Oral, QHS, Weber, Kyra A, NP, 25 mg at 04/28/24 2205   risperiDONE  (RISPERDAL  M-TABS) disintegrating tablet 2 mg, 2 mg, Oral, Q8H PRN, 2 mg at 04/27/24 2136 **AND** [COMPLETED] LORazepam  (ATIVAN ) tablet 1 mg, 1 mg, Oral, PRN, 1 mg at 04/27/24 2137 **AND** [COMPLETED] ziprasidone  (GEODON ) injection 20 mg, 20 mg, Intramuscular, PRN, Nanavati, Ankit, MD, 20 mg at 04/24/24 0020  Current Outpatient Medications:    albuterol  (VENTOLIN  HFA) 108 (90 Base) MCG/ACT inhaler, Inhale 2 puffs into the lungs every 6 (six) hours as needed for wheezing or shortness of breath., Disp: 8 g, Rfl: 2   ALPRAZolam  (XANAX ) 0.5 MG tablet, Take 1 tablet (0.5 mg total) by mouth daily as needed for up to 2 doses for anxiety., Disp: 2 tablet, Rfl: 0   aspirin  EC 81 MG tablet, Take 81 mg by mouth at bedtime., Disp: , Rfl:    atorvastatin  (LIPITOR) 40 MG tablet, Take 1 tablet (40 mg total) by mouth daily., Disp: , Rfl:    busPIRone  (BUSPAR ) 15 MG tablet, Take 1 tablet (15 mg total) by mouth 2 (two) times daily., Disp: 180 tablet, Rfl: 1   Cholecalciferol  (VITAMIN D3) 1.25 MG (50000 UT) CAPS, Take 1 capsule by mouth once a week., Disp: , Rfl:    clopidogrel  (PLAVIX ) 75 MG tablet, TAKE 1 TABLET(75 MG) BY MOUTH DAILY (Patient taking differently: Take 75 mg by mouth at bedtime.), Disp: 90 tablet, Rfl: 3   Cyanocobalamin  (VITAMIN B-12 PO), Take 1 tablet by mouth in the morning., Disp: , Rfl:     divalproex  (DEPAKOTE  ER) 250 MG 24 hr tablet, Take 1 tablet (250 mg total) by mouth at bedtime., Disp: 90 tablet, Rfl: 1   dorzolamide  (TRUSOPT ) 2 % ophthalmic solution, Place 1 drop into both eyes in the morning and at bedtime., Disp: , Rfl:    escitalopram  (LEXAPRO ) 20 MG tablet, Take 1 tablet (20 mg total) by mouth daily., Disp: 90 tablet, Rfl: 1   ezetimibe  (ZETIA ) 10 MG tablet, TAKE 1 TABLET(10 MG) BY MOUTH DAILY, Disp: 90 tablet, Rfl: 1   furosemide  (LASIX ) 40 MG tablet, Take 1 tablet (40 mg total) by mouth daily as needed for fluid or edema., Disp: , Rfl:    ketoconazole  (NIZORAL ) 2 % shampoo, APPLY TOPICALLY 2 TIMES A WEEK., Disp: 120 mL, Rfl: 5   levothyroxine  (SYNTHROID ) 150 MCG tablet, Take 1 tablet (150 mcg total) by mouth daily at 6 (six) AM., Disp: , Rfl:    meclizine  (ANTIVERT ) 25 MG tablet, Take 1 tablet (25 mg total) by mouth as needed for dizziness. (Patient taking differently: Take 25 mg by mouth 3 (three) times daily as needed for dizziness.), Disp: 30 tablet, Rfl: 0   metoprolol  succinate (TOPROL -XL) 25 MG 24 hr tablet, Take 1 tablet (25 mg total) by mouth daily., Disp: 90 tablet, Rfl: 1   nitroGLYCERIN  (NITROSTAT ) 0.4 MG SL tablet, Place 1 tablet (0.4 mg total) under the tongue every 5 (five) minutes x 3 doses as needed for chest pain., Disp: 25 tablet, Rfl: 1   pantoprazole  (PROTONIX ) 40 MG tablet, Take 1 tablet (40 mg total) by mouth daily., Disp: 90 tablet, Rfl: 1   QUEtiapine  (SEROQUEL ) 25 MG tablet, Take 1 tablet (25 mg total) by mouth at bedtime. Would wean as tolerated, Disp: , Rfl:    TYLENOL  500 MG tablet, Take 500-1,000 mg by  mouth 2 (two) times daily., Disp: , Rfl:   Allergies: Allergies  Allergen Reactions   Lidocaine  Swelling and Other (See Comments)    Use CORRECT dose for age/weight- red welts and tongue swelling result, if not   Cymbalta  [Duloxetine  Hcl] Other (See Comments)    Increased confusion and memory concerns    Donepezil Other (See Comments)     Altered mood and Aggression     Mobic [Meloxicam] Other (See Comments)    Heart failure- cannot take   Namenda [Memantine] Other (See Comments)    Severe aggression   Nsaids Other (See Comments)    Cannot have due to heart issues   Other Other (See Comments)    No salt or anything that might cause fluid retention/swelling!!   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, however(??)   Vicodin [Hydrocodone-Acetaminophen ] Other (See Comments)    Delirium, Confusion, and Toxic dementia    Chaddrick Brue MOTLEY-MANGRUM, PMHNP

## 2024-04-29 NOTE — ED Notes (Signed)
Patient in room sitting in chair

## 2024-04-29 NOTE — ED Provider Notes (Signed)
 Emergency Medicine Observation Re-evaluation Note  Deanna [name] is a 83 y.o. female, seen on rounds today.  Pt initially presented to the ED for complaints of Altered Mental Status Currently, the patient is sleeping.  Physical Exam  BP (!) 163/88 (BP Location: Right Arm)   Pulse 93   Temp 98.4 F (36.9 C) (Oral)   Resp 18   Ht 5' 1 (1.549 m)   Wt 74.8 kg   SpO2 95%   BMI 31.18 kg/m  Physical Exam General: No acute distress, sleeping Cardiac: Well-perfused Lungs: No respiratory distress Psych: Sleeping currently  ED Course / MDM  EKG:EKG Interpretation Date/Time:  Tuesday April 23 2024 19:37:49 EDT Ventricular Rate:  96 PR Interval:  172 QRS Duration:  100 QT Interval:  380 QTC Calculation: 481 R Axis:   25  Text Interpretation: Sinus rhythm Left ventricular hypertrophy Borderline T abnormalities, lateral leads Confirmed by Charlyn Sora [phone]) on 04/23/2024 11:53:15 PM  I have reviewed the labs performed to date as well as medications administered while in observation.  Recent changes in the last 24 hours include the patient has had some intermittent agitation overnight but was mostly redirectable.  She is resting currently.  Does have as needed medications for agitation.  Plan  Current plan is for placement.    Ula Prentice JONELLE, MD 04/29/24 [phone]

## 2024-04-29 NOTE — ED Provider Notes (Signed)
 After working with psychiatry and SW/TOC patient will be discharged in the care of family with outpatient support for possible placement. Stable at time of DC.   Bari Roxie HERO, DO 04/29/24 1607

## 2024-04-29 NOTE — Progress Notes (Signed)
 ICM consulted for resources regarding Memory Care and Medicaid.  CSW attempted to contact pt's daughter without success. Left HIPAA Compliant voicemail requesting call back. Per Psych NP, daughter is local. CSW has attached resources to pt's AVS. CSW left information for [facility] for the daughter to follow up regarding transitioning pt into memory care. CSW also listed DSS as recommended follow up provider to discuss eligibility for Medicaid.   No further ICM needs at this time.

## 2024-04-29 NOTE — ED Notes (Signed)
 Patient in room saying please call my family and let them know I am where I am Nurse reassured patient her family is aware of where she is. Patient redirected into chair due to her getting up and being a fall risk.

## 2024-04-29 NOTE — Discharge Instructions (Addendum)
 Please follow up with patient PCP to have Valproic level checked monthly due to patient being on Depakote .  Please reach out to [facility] to inquire about support in transitioning to Memory Care.     Discharge recommendations:  Patient is to take medications as prescribed. Please see information for follow-up appointment with psychiatry and therapy. Please follow up with your primary care provider for all medical related needs.   Therapy: We recommend that patient participate in individual therapy to address mental health concerns.  Medications: The patient or guardian is to contact a medical professional and/or outpatient provider to address any new side effects that develop. The patient or guardian should update outpatient providers of any new medications and/or medication changes.   Atypical antipsychotics: If you are prescribed an atypical antipsychotic, it is recommended that your height, weight, BMI, blood pressure, fasting lipid panel, and fasting blood sugar be monitored by your outpatient providers.  Safety:  The patient should abstain from use of illicit substances/drugs and abuse of any medications. If symptoms worsen or do not continue to improve or if the patient becomes actively suicidal or homicidal then it is recommended that the patient return to the closest hospital emergency department, the [facility], or call 911 for further evaluation and treatment. National Suicide Prevention Lifeline 1-800-SUICIDE or 438-110-8759.  About 988 988 offers 24/7 access to trained crisis counselors who can help people experiencing mental health-related distress. People can call or text 988 or chat 988lifeline.org for themselves or if they are worried about a loved one who may need crisis support.  Crisis Mobile: [facility]:                     907-533-3039 (for crisis response 24 hours a day) [facility] Hotline:                                             856-328-2217   Safety Plan Deanna [name] will reach out to her daughter Deanna [name], call 911 or call mobile crisis, or go to nearest emergency room if condition worsens or if suicidal thoughts become active Patients' will follow up with behavioral health urgent care for outpatient psychiatric services (therapy/medication management).  The suicide prevention education provided includes the following: Suicide risk factors Suicide prevention and interventions National Suicide Hotline telephone number [facility] assessment telephone number [facility] Emergency Assistance [address] [facility] [address] [address] and/or Residential Mobile Crisis Unit telephone number Request made of family/significant other to:  daughter Deanna [name] Remove weapons (e.g., guns, rifles, knives), all items previously/currently identified as safety concern.   Remove drugs/medications (over the counter, prescriptions, illicit drugs), all items previously/currently identified as a safety concern.

## 2024-04-29 NOTE — ED Notes (Signed)
 Patient sitting in chair in room asking the same question multiple times over and over again  Can we go upstairs and get my purse can we call patricia are they coming to get me

## 2024-04-29 NOTE — ED Notes (Signed)
 Patient sitting in chair in room asking questions about patricia, where is she can we go upstairs so I can get my purse.

## 2024-05-01 ENCOUNTER — Encounter: Payer: Self-pay | Admitting: Adult Health

## 2024-05-01 ENCOUNTER — Ambulatory Visit: Admitting: Adult Health

## 2024-05-01 VITALS — BP 120/78 | HR 75 | Temp 97.8°F | Resp 17 | Ht 61.0 in | Wt 166.6 lb

## 2024-05-01 DIAGNOSIS — Z8673 Personal history of transient ischemic attack (TIA), and cerebral infarction without residual deficits: Secondary | ICD-10-CM

## 2024-05-01 DIAGNOSIS — E785 Hyperlipidemia, unspecified: Secondary | ICD-10-CM

## 2024-05-01 DIAGNOSIS — F03C2 Unspecified dementia, severe, with psychotic disturbance: Secondary | ICD-10-CM | POA: Diagnosis not present

## 2024-05-01 DIAGNOSIS — R410 Disorientation, unspecified: Secondary | ICD-10-CM

## 2024-05-01 DIAGNOSIS — F411 Generalized anxiety disorder: Secondary | ICD-10-CM

## 2024-05-01 DIAGNOSIS — E039 Hypothyroidism, unspecified: Secondary | ICD-10-CM

## 2024-05-01 DIAGNOSIS — F39 Unspecified mood [affective] disorder: Secondary | ICD-10-CM

## 2024-05-01 DIAGNOSIS — I10 Essential (primary) hypertension: Secondary | ICD-10-CM | POA: Diagnosis not present

## 2024-05-01 LAB — POCT URINALYSIS DIPSTICK
Bilirubin, UA: NEGATIVE
Glucose, UA: NEGATIVE
Ketones, UA: POSITIVE
Leukocytes, UA: NEGATIVE
Nitrite, UA: NEGATIVE
Protein, UA: POSITIVE — AB
Spec Grav, UA: <=1.005 — AB (ref 1.010–1.025)
Urobilinogen, UA: NEGATIVE U/dL
pH, UA: 5 (ref 5.0–8.0)

## 2024-05-01 MED ORDER — LEVOTHYROXINE SODIUM 150 MCG PO TABS: 150.0000 ug | ORAL_TABLET | Freq: Every day | ORAL | Status: DC

## 2024-05-01 MED ORDER — ESCITALOPRAM OXALATE 20 MG PO TABS: 20.0000 mg | ORAL_TABLET | Freq: Every day | ORAL | 1 refills | Status: DC

## 2024-05-01 MED ORDER — LEVOTHYROXINE SODIUM 150 MCG PO TABS: 150.0000 ug | ORAL_TABLET | Freq: Every day | ORAL | 1 refills | Status: DC

## 2024-05-01 MED ORDER — DIVALPROEX SODIUM ER 250 MG PO TB24: 250.0000 mg | ORAL_TABLET | Freq: Every day | ORAL | 1 refills | Status: DC

## 2024-05-01 MED ORDER — QUETIAPINE FUMARATE 25 MG PO TABS: 25.0000 mg | ORAL_TABLET | Freq: Every day | ORAL | 1 refills | Status: DC

## 2024-05-01 NOTE — Progress Notes (Signed)
 [facility] clinic  Provider:  [name]  Code Status:  Full Code  Goals of Care:     05/01/2024    2:21 PM  Advanced Directives  Does Patient Have a Medical Advance Directive? Yes  Type of Estate agent of [place];Living will  Does patient want to make changes to medical advance directive? No - Patient declined  Copy of Healthcare Power of Attorney in Chart? No - copy requested     Chief Complaint  Patient presents with   Hospitalization Follow-up    04/23/2024-04/29/2024   Concern    Patient daughter states that she's having confusion and delusional thinking.   Discussed the use of AI scribe software for clinical note transcription with the patient, who gave verbal consent to proceed.  HPI: Patient is a 83 y.o. female seen today for a hospital follow up. She is accompanied by her daughter, who is her primary caregiver.  She was hospitalized from September 16 to April 29, 2024, due to severe dementia with psychotic features. She has a history of a stroke in May 2025, which has led to worsening behavior, including increased paranoia, psychosis, and hostility. She experiences episodes of not recognizing her daughter and calling the police. Her symptoms are more pronounced in the evenings, where she becomes fearful and delusional.  During her recent hospitalization, she was started on Seroquel . Previously, she was on Depakote  and Zoloft  for mood swings, but her psychiatrist left the practice, and she has not been able to refill these medications for a couple of months. She has not taken donepezil or escitalopram  for three months. Her current medications include Seroquel  35 mg at bedtime, escitalopram  20 mg daily, and Divalproex  250 mg at bedtime. She also takes levothyroxine  150 mcg daily, which was adjusted from 175 mcg by her neurologist.  Her daughter reports that she has two distinct personalities, with significant changes at night. 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  She experiences chronic pain in her left arm due to arthritic changes and bone spurs, which is exacerbated by movement. She has received a shot for the pain, but it only provided relief for two weeks. She takes Tylenol  and gabapentin  for pain management.  She has a history of chronic kidney disease, with recent labs showing improvement. Her GFR was 45, which is better than previous results. She also has hypertension, managed with metoprolol  25 mg daily, and hyperlipidemia, for which she takes rosuvastatin . She is also on Plavix  75 mg daily for stroke prevention.  Her daughter reports that she has balance problems and vision issues, which limit her mobility. She is able to walk minimally with assistance.    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   CKD (chronic kidney disease), stage IV (HCC)    Cognitive impairment    Colon polyps    Community acquired pneumonia    Depression    Diverticulosis    Dysuria    Encephalomalacia    GAD (generalized anxiety disorder)    GERD (gastroesophageal reflux disease)  Glaucoma    Hearing loss    Hemorrhoids    Hypertension    Hypothyroidism    Incontinence    Mixed hyperlipidemia    Myocardial infarction [facility])    NSTEMI (non-ST elevated myocardial infarction) (HCC) 12/19/2011   OSA on CPAP    PAF (paroxysmal atrial fibrillation) (HCC)    Peripheral arterial disease    Pneumonitis 04/26/2015   Pyelonephritis due to Escherichia coli 04/28/2015   Sepsis (HCC)    Spondylosis of lumbar region  without myelopathy or radiculopathy    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HCC)    Urticaria    Vertigo, benign positional     Past Surgical History:  Procedure Laterality Date   ABDOMINAL AORTOGRAM W/LOWER EXTREMITY N/A 08/22/2017   Procedure: ABDOMINAL AORTOGRAM W/LOWER EXTREMITY;  Surgeon: Serene Gaile ORN, MD;  [facility] INVASIVE CV LAB;  Service: Cardiovascular;  Laterality: N/A;   BILATERAL UPPER EXTREMITY ANGIOGRAM N/A 09/11/2012   Procedure: BILATERAL UPPER EXTREMITY ANGIOGRAM;  Surgeon: Gaile ORN Serene, MD;  Location: [facility] CATH LAB;  Service: Cardiovascular;  Laterality: N/A;   BIOPSY  03/25/2020   Procedure: BIOPSY;  Surgeon: Wilhelmenia Aloha Raddle., MD;  Location: [facility] ENDOSCOPY;  Service: Gastroenterology;;   carotid duplex doppler Bilateral 09/03/2012, 11/03/2011   Evidence of 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 patent left common carotid- subclavian artery bypass with turbulent flow noted at the anastomosis with velocities of 295 cm/s   CAROTID-SUBCLAVIAN BYPASS GRAFT  12/15/2011   Procedure: BYPASS GRAFT CAROTID-SUBCLAVIAN;  Surgeon: Gaile ORN Serene, MD;  Location: [facility] OR;  Service: Vascular;  Laterality: Left;  Left Carotid subclavian bypass   CORONARY ANGIOPLASTY     CORONARY ARTERY BYPASS GRAFT     CORONARY ATHERECTOMY N/A 03/19/2020   Procedure: CORONARY ATHERECTOMY;  Surgeon: Dann Candyce RAMAN, MD;  Location: [facility] INVASIVE CV LAB;  Service: Cardiovascular;  Laterality: N/A;   CORONARY STENT INTERVENTION N/A 03/19/2020   Procedure: CORONARY STENT INTERVENTION;  Surgeon: Dann Candyce RAMAN, MD;  Location: [facility] INVASIVE CV LAB;  Service: Cardiovascular;  Laterality: N/A;   DOPPLER ECHOCARDIOGRAPHY  05/27/2010, 09/17/2008   Mild Proximal septal thickening is noted. Left ventricular systolic functions is normal ejection fraction =>55%. the aortic  valve appears to be mildly sclerotic    ENDARTERECTOMY Right 12/13/2023   Procedure: ENDARTERECTOMY, CAROTID;  Surgeon: Serene Gaile ORN, MD;  Location: [facility] OR;  Service: Vascular;  Laterality: Right;   ESOPHAGOGASTRODUODENOSCOPY (EGD) WITH PROPOFOL  N/A 03/25/2020   Procedure: ESOPHAGOGASTRODUODENOSCOPY (EGD) WITH PROPOFOL ;  Surgeon: Wilhelmenia Aloha Raddle., MD;  Location: [facility] ENDOSCOPY;  Service: Gastroenterology;  Laterality: N/A;   fem-fem bypass graft  1999   holter monitor  01/21/2008   The predominant rhythm was normal sinus rhythm. Minimum heartrate of 63 bpm at +01:00, maximum heartrate of 105 bpm at + 10:35; and the average heartrate of 75 bpm. Ventricular ectopic activity totaled 1270: Multifocal; 866-PVC's and 404-VEs              JOINT REPLACEMENT     Left knee   LEFT HEART CATH AND CORS/GRAFTS ANGIOGRAPHY N/A 03/19/2020   Procedure: LEFT HEART CATH AND CORS/GRAFTS ANGIOGRAPHY;  Surgeon: Dann Candyce RAMAN, MD;  Location: [facility] INVASIVE CV LAB;  Service: Cardiovascular;  Laterality: N/A;   LEFT HEART CATHETERIZATION WITH CORONARY/GRAFT ANGIOGRAM N/A 12/21/2011   Procedure: LEFT HEART CATHETERIZATION WITH EL BILE;  Surgeon: Alm ORN Clay, MD;  Location: [facility] CATH  LAB;  Service: Cardiovascular;  Laterality: N/A;   NM MYOCAR PERF EJECTION FRACTION  09/22/2009, 07/03/2007   the post stress myocardial perfusion images show a normal pattern of perfusion is all regions. The post-stress ejection fraction is 68 %. no significant wall motion abnormalities noted. This is a low risk scan.   PATCH ANGIOPLASTY Right 12/13/2023   Procedure: ANGIOPLASTY, USING PATCH GRAFT;  Surgeon: Serene Gaile ORN, MD;  Location[facility] OR;  Service: Vascular;  Laterality: Right;   PERIPHERAL VASCULAR INTERVENTION  08/22/2017   Procedure: PERIPHERAL VASCULAR INTERVENTION;  Surgeon: Serene Gaile ORN, MD;  Location: [facility] INVASIVE CV LAB;  Service: Cardiovascular;;  Fem/Fem Graft   REPLACEMENT TOTAL KNEE  05-2011    UNILATERAL UPPER EXTREMEITY ANGIOGRAM Left 11/15/2011   Procedure: UNILATERAL UPPER VENITA BILE;  Surgeon: Dorn JINNY Lesches, MD;  Location: [facility] CATH LAB;  Service: Cardiovascular;  Laterality: Left;    Allergies  Allergen Reactions   Lidocaine  Swelling and Other (See Comments)    Use CORRECT dose for age/weight- red welts and tongue swelling result, if not   Cymbalta  [Duloxetine  Hcl] Other (See Comments)    Increased confusion and memory concerns    Donepezil Other (See Comments)    Altered mood and Aggression     Mobic [Meloxicam] Other (See Comments)    Heart failure- cannot take   Namenda [Memantine] Other (See Comments)    Severe aggression   Nsaids Other (See Comments)    Cannot have due to heart issues   Other Other (See Comments)    No salt or anything that might cause fluid retention/swelling!!   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, however(??)   Vicodin [Hydrocodone-Acetaminophen ] Other (See Comments)    Delirium, Confusion, and Toxic dementia    Outpatient Encounter Medications as of 05/01/2024  Medication Sig   meclizine  (ANTIVERT ) 25 MG tablet Take 1 tablet (25 mg total) by mouth as needed for dizziness.   albuterol  (VENTOLIN  HFA) 108 (90 Base) MCG/ACT inhaler Inhale 2 puffs into the lungs every 6 (six) hours as needed for wheezing or shortness of breath.   ALPRAZolam  (XANAX ) 0.5 MG tablet Take 1 tablet (0.5 mg total) by mouth daily as needed for up to 2 doses for anxiety.   aspirin  EC 81 MG tablet Take 81 mg by mouth at bedtime.   busPIRone  (BUSPAR ) 15 MG tablet Take 1 tablet (15 mg total) by mouth 2 (two) times daily.   Cholecalciferol  (VITAMIN D3) 1.25 MG (50000 UT) CAPS Take 1 capsule by mouth once a week.   clopidogrel  (PLAVIX ) 75 MG tablet TAKE 1 TABLET(75 MG) BY MOUTH DAILY (Patient taking differently: Take 75 mg by mouth at bedtime.)   Cyanocobalamin  (VITAMIN B-12 PO) Take 1 tablet by  mouth in the morning.   divalproex  (DEPAKOTE  ER) 250 MG 24 hr tablet Take 1 tablet (250 mg total) by mouth at bedtime.   dorzolamide  (TRUSOPT ) 2 % ophthalmic solution Place 1 drop into both eyes in the morning and at bedtime.   escitalopram  (LEXAPRO ) 20 MG tablet Take 1 tablet (20 mg total) by mouth daily.   ezetimibe  (ZETIA ) 10 MG tablet TAKE 1 TABLET(10 MG) BY MOUTH DAILY   furosemide  (LASIX ) 40 MG tablet Take 1 tablet (40 mg total) by mouth daily as needed for fluid or edema.   ketoconazole  (NIZORAL ) 2 % shampoo APPLY 10ML TOPICALLY 2 TIMES A WEEK.   levothyroxine  (SYNTHROID ) 150 MCG tablet Take 1  tablet (150 mcg total) by mouth daily at 6 (six) AM.   metoprolol  succinate (TOPROL -XL) 25 MG 24 hr tablet Take 1 tablet (25 mg total) by mouth daily.   nitroGLYCERIN  (NITROSTAT ) 0.4 MG SL tablet Place 1 tablet (0.4 mg total) under the tongue every 5 (five) minutes x 3 doses as needed for chest pain.   pantoprazole  (PROTONIX ) 40 MG tablet Take 1 tablet (40 mg total) by mouth daily.   QUEtiapine  (SEROQUEL ) 25 MG tablet Take 1 tablet (25 mg total) by mouth at bedtime. Would wean as tolerated   TYLENOL  500 MG tablet Take 500-1,000 mg by mouth 2 (two) times daily.   [DISCONTINUED] atorvastatin  (LIPITOR) 40 MG tablet Take 1 tablet (40 mg total) by mouth daily.   [DISCONTINUED] divalproex  (DEPAKOTE  ER) 250 MG 24 hr tablet Take 1 tablet (250 mg total) by mouth at bedtime.   [DISCONTINUED] escitalopram  (LEXAPRO ) 20 MG tablet Take 1 tablet (20 mg total) by mouth daily.   [DISCONTINUED] levothyroxine  (SYNTHROID ) 150 MCG tablet Take 1 tablet (150 mcg total) by mouth daily at 6 (six) AM.   [DISCONTINUED] QUEtiapine  (SEROQUEL ) 25 MG tablet Take 1 tablet (25 mg total) by mouth at bedtime. Would wean as tolerated   No facility-administered encounter medications on file as of 05/01/2024.    Review of Systems:  Review of Systems  Constitutional:  Negative for appetite change, chills, fatigue and fever.  HENT:   Negative for congestion, hearing loss, rhinorrhea and sore throat.   Eyes: Negative.   Respiratory:  Negative for cough, shortness of breath and wheezing.   Cardiovascular:  Negative for chest pain, palpitations and leg swelling.  Gastrointestinal:  Negative for abdominal pain, constipation, diarrhea, nausea and vomiting.  Genitourinary:  Negative for dysuria.  Musculoskeletal:  Negative for arthralgias, back pain and myalgias.  Skin:  Negative for color change, rash and wound.  Neurological:  Negative for dizziness, weakness and headaches.  Psychiatric/Behavioral:  Positive for behavioral problems and confusion. The patient is not nervous/anxious.     Health Maintenance  Topic Date Due   DTaP/Tdap/Td (1 - Tdap) Never done   Zoster Vaccines- Shingrix (2 of 2) 08/17/2016   Medicare Annual Wellness (AWV)  05/17/2023   COVID-19 Vaccine (4 - 2025-26 season) 04/08/2024   Influenza Vaccine  05/08/2024 (Originally 03/08/2024)   Pneumococcal Vaccine: 50+ Years  Completed   DEXA SCAN  Completed   HPV VACCINES  Aged Out   Meningococcal B Vaccine  Aged Out   Colonoscopy  Discontinued   Hepatitis C Screening  Discontinued    Physical Exam: Vitals:   05/01/24 1422  BP: 120/78  Pulse: 75  Resp: 17  Temp: 97.8 F (36.6 C)  SpO2: 94%  Weight: 166 lb 9.6 oz (75.6 kg)  Height: 5' 1 (1.549 m)   Body mass index is 31.48 kg/m. Physical Exam Constitutional:      General: She is not in acute distress.    Appearance: She is obese.  HENT:     Head: Normocephalic and atraumatic.     Nose: Nose normal.     Mouth/Throat:     Mouth: Mucous membranes are moist.  Eyes:     Conjunctiva/sclera: Conjunctivae normal.  Cardiovascular:     Rate and Rhythm: Normal rate and regular rhythm.  Pulmonary:     Effort: Pulmonary effort is normal.     Breath sounds: Normal breath sounds.  Abdominal:     General: Bowel sounds are normal.     Palpations: Abdomen is  soft.  Musculoskeletal:        General:  Normal range of motion.     Cervical back: Normal range of motion.  Skin:    General: Skin is warm and dry.  Neurological:     Mental Status: She is alert.  Psychiatric:        Mood and Affect: Mood normal.        Behavior: Behavior normal.     Labs reviewed: Basic Metabolic Panel: Recent Labs    08/17/23 0717 09/12/23 1351 12/15/23 2148 12/18/23 0300 12/19/23 0318 12/21/23 0258 02/06/24 1544 03/29/24 1739 04/23/24 1954  NA 134*   < > 139 139   < > 138 141 138 141  K 3.8   < > 4.3 3.2*   < > 4.1 4.1 4.1 4.3  CL 97*   < > 108 97*   < > 98 103 104 105  CO2 27   < > 23 31   < > 31 26 22 24   GLUCOSE 101*   < > 122* 113*   < > 113* 97 157* 107*  BUN 17   < > 15 16   < > 19 20 21 18   CREATININE 1.18*   < > 1.25* 1.31*   < > 1.54* 1.43* 1.42* 1.20*  CALCIUM  9.2   < > 9.1 9.2   < > 9.1 10.1 9.6 10.0  MG  --   --   --  1.4*  --  1.6*  --   --   --   PHOS  --   --   --   --   --  2.8  --   --   --   TSH 6.574*  --  0.236*  --   --   --  1.71  --   --    < > = values in this interval not displayed.   Liver Function Tests: Recent Labs    12/21/23 0258 02/06/24 1544 03/29/24 1739 04/23/24 1954  AST 17 13 22 20   ALT 12 8 22 8   ALKPHOS 51  --  71 104  BILITOT 0.8 1.6* 1.4* 1.0  PROT 5.7* 6.9 6.4* 6.9  ALBUMIN  3.2*  --  3.7 4.2   Recent Labs    03/29/24 1739  LIPASE 36   Recent Labs    12/18/23 1018  AMMONIA 39*   CBC: Recent Labs    12/15/23 2148 12/17/23 0235 12/21/23 0258 02/06/24 1544 03/29/24 1739 04/23/24 1954  WBC 7.7   < > 6.4 6.8 8.9 8.5  NEUTROABS 5.5  --  3.0 4,570  --   --   HGB 11.5*   < > 12.0 13.7 14.4 14.6  HCT 37.4   < > 37.4 43.5 45.4 47.2*  MCV 92.6   < > 86.4 87.5 89.5 87.7  PLT 120*   < > 193 170 153 212   < > = values in this interval not displayed.   Lipid Panel: Recent Labs    06/16/23 1558 12/14/23 0424  CHOL 98 73  HDL 32* 22*  LDLCALC 40 29  TRIG 187* 109  CHOLHDL 3.1 3.3   Lab Results  Component Value Date    HGBA1C 5.3 12/18/2023    Procedures since last visit: No results found.  Assessment/Plan  1. Severe dementia with psychotic disturbance, unspecified dementia type (HCC) (Primary) -  Severe dementia with paranoia, psychosis, and behavioral disturbances. Recent hospitalization for altered mental status. Started on Seroquel  during hospitalization.  No UTI detected. - Prescribe Seroquel  25 mg at bedtime. - Prescribe escitalopram  20 mg daily. - Prescribe Divalproex  250 mg at bedtime. - POC Urinalysis Dipstick showed positive protein, negative leukocytes, negative nitrite - Culture, Urine - QUEtiapine  (SEROQUEL ) 25 MG tablet; Take 1 tablet (25 mg total) by mouth at bedtime. Would wean as tolerated  Dispense: 90 tablet; Refill: 1  2. History of CVA (cerebrovascular accident) -  Stroke in May 2025 with residual cognitive impairment. Affected same area as previous traumatic brain injury. -Continue Plavix  75 mg daily - Continue aspirin  81 mg daily   -   Continue Zetia  10 mg daily  3. Essential hypertension -   well-controlled with metoprolol . Recent BP 120/78 mmHg. - Continue metoprolol  succinate 25 mg daily.  4. Hyperlipidemia, unspecified hyperlipidemia type Lab Results  Component Value Date   CHOL 73 12/14/2023   HDL 22 (L) 12/14/2023   LDLCALC 29 12/14/2023   TRIG 109 12/14/2023   CHOLHDL 3.3 12/14/2023    - Continue Zetia  10 mg daily  5. Acquired hypothyroidism Lab Results  Component Value Date   TSH 1.71 02/06/2024    -  Recent dose adjustment to 150 mcg. TSH level on February 06, 2024, was 1.71. - Prescribe levothyroxine  150 mcg daily  - levothyroxine  (SYNTHROID ) 150 MCG tablet; Take 1 tablet (150 mcg total) by mouth daily at 6 (six) AM.  Dispense: 90 tablet; Refill: 1  6. Mood disorder -Continue Depakote  250 mg at bedtime Continue Lexapro  20 mg daily-    - divalproex  (DEPAKOTE  ER) 250 MG 24 hr tablet; Take 1 tablet (250 mg total) by mouth at bedtime.  Dispense: 90 tablet;  Refill: 1 - escitalopram  (LEXAPRO ) 20 MG tablet; Take 1 tablet (20 mg total) by mouth daily.  Dispense: 90 tablet; Refill: 1  7. Generalized anxiety disorder -Continue 20 mg daily - escitalopram  (LEXAPRO ) 20 MG tablet; Take 1 tablet (20 mg total) by mouth daily.  Dispense: 90 tablet; Refill: 1       Treatment/labs/tests ordered: POC urine dipstick, urine culture   Return if symptoms worsen or fail to improve.  Suki Crockett Medina-Vargas, NP

## 2024-05-03 ENCOUNTER — Ambulatory Visit: Payer: Self-pay | Admitting: Adult Health

## 2024-05-03 LAB — URINE CULTURE
MICRO NUMBER:: 17015384
Result:: NO GROWTH
SPECIMEN QUALITY:: ADEQUATE

## 2024-05-03 NOTE — Progress Notes (Signed)
-    urine culture showed no growth, no UTI

## 2024-05-06 ENCOUNTER — Telehealth: Payer: Self-pay

## 2024-05-06 NOTE — Telephone Encounter (Signed)
   Primary Cardiologist: Dorn Lesches, MD  Chart reviewed as part of pre-operative protocol coverage. Simple dental extractions are considered low risk procedures per guidelines and generally do not require any specific cardiac clearance. It is also generally accepted that for simple extractions and dental cleanings, there is no need to interrupt blood thinner therapy.   SBE prophylaxis is not required for the patient.  If holding of Plavix  is required, request would need to be forward to [facility] given recent endarterectomy.   I will route this recommendation to the requesting party via Epic fax function and remove from pre-op pool.  Please call with questions.  Rosaline EMERSON Bane, NP-C  05/06/2024, 3:15 PM [address] [place], [place] [address] Office [phone] Fax [phone]

## 2024-05-06 NOTE — Telephone Encounter (Signed)
   Pre-operative Risk Assessment    Patient Name: Deanna [name]  DOB: Jan 06, 1941 MRN: [id number]   Date of last office visit: 02/05/24 DORN LESCHES, MD Date of next office visit: NONE  Request for Surgical Clearance    Procedure:  DENTAL FILLINGS DUE TO DECAY/ EXTRACTIONS  Date of Surgery:  Clearance TBD                                Surgeon:  NOT INDICATED Surgeon's Group or Practice Name:  [facility] Phone number:  [phone] Fax number:  [phone]   Type of Clearance Requested:   - Medical  - Pharmacy:  Hold Aspirin  and Clopidogrel  (Plavix )     Type of Anesthesia:  Not Indicated   Additional requests/questions:    Signed, Lucie DELENA Ku   05/06/2024, 1:58 PM

## 2024-05-15 DIAGNOSIS — Z961 Presence of intraocular lens: Secondary | ICD-10-CM | POA: Diagnosis not present

## 2024-05-15 DIAGNOSIS — H401133 Primary open-angle glaucoma, bilateral, severe stage: Secondary | ICD-10-CM | POA: Diagnosis not present

## 2024-06-10 ENCOUNTER — Emergency Department (HOSPITAL_COMMUNITY)

## 2024-06-10 ENCOUNTER — Other Ambulatory Visit: Payer: Self-pay

## 2024-06-10 ENCOUNTER — Encounter: Payer: Self-pay | Admitting: Radiology

## 2024-06-10 ENCOUNTER — Observation Stay (HOSPITAL_COMMUNITY)
Admission: EM | Admit: 2024-06-10 | Discharge: 2024-06-14 | Disposition: A | Discharge status: Skilled Nursing Facility | Attending: Internal Medicine | Admitting: Internal Medicine

## 2024-06-10 ENCOUNTER — Encounter (HOSPITAL_COMMUNITY): Payer: Self-pay

## 2024-06-10 DIAGNOSIS — F0390 Unspecified dementia without behavioral disturbance: Secondary | ICD-10-CM | POA: Diagnosis not present

## 2024-06-10 DIAGNOSIS — R413 Other amnesia: Secondary | ICD-10-CM | POA: Insufficient documentation

## 2024-06-10 DIAGNOSIS — N183 Chronic kidney disease, stage 3 unspecified: Secondary | ICD-10-CM | POA: Insufficient documentation

## 2024-06-10 DIAGNOSIS — F609 Personality disorder, unspecified: Secondary | ICD-10-CM | POA: Insufficient documentation

## 2024-06-10 DIAGNOSIS — Z8673 Personal history of transient ischemic attack (TIA), and cerebral infarction without residual deficits: Secondary | ICD-10-CM | POA: Diagnosis not present

## 2024-06-10 DIAGNOSIS — E039 Hypothyroidism, unspecified: Secondary | ICD-10-CM | POA: Diagnosis not present

## 2024-06-10 DIAGNOSIS — Z87820 Personal history of traumatic brain injury: Secondary | ICD-10-CM

## 2024-06-10 DIAGNOSIS — E785 Hyperlipidemia, unspecified: Secondary | ICD-10-CM | POA: Diagnosis not present

## 2024-06-10 DIAGNOSIS — G9341 Metabolic encephalopathy: Secondary | ICD-10-CM | POA: Diagnosis not present

## 2024-06-10 DIAGNOSIS — N3090 Cystitis, unspecified without hematuria: Principal | ICD-10-CM | POA: Insufficient documentation

## 2024-06-10 DIAGNOSIS — F22 Delusional disorders: Secondary | ICD-10-CM

## 2024-06-10 DIAGNOSIS — I700 Atherosclerosis of aorta: Secondary | ICD-10-CM | POA: Insufficient documentation

## 2024-06-10 DIAGNOSIS — L988 Other specified disorders of the skin and subcutaneous tissue: Secondary | ICD-10-CM | POA: Insufficient documentation

## 2024-06-10 DIAGNOSIS — K219 Gastro-esophageal reflux disease without esophagitis: Secondary | ICD-10-CM | POA: Diagnosis present

## 2024-06-10 DIAGNOSIS — F419 Anxiety disorder, unspecified: Secondary | ICD-10-CM | POA: Insufficient documentation

## 2024-06-10 DIAGNOSIS — I6389 Other cerebral infarction: Secondary | ICD-10-CM | POA: Diagnosis not present

## 2024-06-10 DIAGNOSIS — I10 Essential (primary) hypertension: Secondary | ICD-10-CM | POA: Diagnosis present

## 2024-06-10 DIAGNOSIS — K5900 Constipation, unspecified: Secondary | ICD-10-CM | POA: Diagnosis not present

## 2024-06-10 DIAGNOSIS — F03918 Unspecified dementia, unspecified severity, with other behavioral disturbance: Secondary | ICD-10-CM | POA: Diagnosis present

## 2024-06-10 DIAGNOSIS — Z79899 Other long term (current) drug therapy: Secondary | ICD-10-CM | POA: Diagnosis not present

## 2024-06-10 DIAGNOSIS — R2681 Unsteadiness on feet: Secondary | ICD-10-CM | POA: Insufficient documentation

## 2024-06-10 DIAGNOSIS — I5042 Chronic combined systolic (congestive) and diastolic (congestive) heart failure: Secondary | ICD-10-CM | POA: Diagnosis not present

## 2024-06-10 DIAGNOSIS — I2510 Atherosclerotic heart disease of native coronary artery without angina pectoris: Secondary | ICD-10-CM | POA: Insufficient documentation

## 2024-06-10 DIAGNOSIS — M898X9 Other specified disorders of bone, unspecified site: Secondary | ICD-10-CM | POA: Insufficient documentation

## 2024-06-10 DIAGNOSIS — R410 Disorientation, unspecified: Secondary | ICD-10-CM | POA: Diagnosis present

## 2024-06-10 DIAGNOSIS — Z7982 Long term (current) use of aspirin: Secondary | ICD-10-CM | POA: Diagnosis not present

## 2024-06-10 DIAGNOSIS — F32A Depression, unspecified: Secondary | ICD-10-CM | POA: Diagnosis not present

## 2024-06-10 DIAGNOSIS — F411 Generalized anxiety disorder: Secondary | ICD-10-CM | POA: Diagnosis present

## 2024-06-10 DIAGNOSIS — I130 Hypertensive heart and chronic kidney disease with heart failure and stage 1 through stage 4 chronic kidney disease, or unspecified chronic kidney disease: Secondary | ICD-10-CM | POA: Insufficient documentation

## 2024-06-10 DIAGNOSIS — N3000 Acute cystitis without hematuria: Secondary | ICD-10-CM | POA: Diagnosis present

## 2024-06-10 LAB — URINALYSIS, W/ REFLEX TO CULTURE (INFECTION SUSPECTED)
Bilirubin Urine: NEGATIVE
Glucose, UA: NEGATIVE mg/dL
Ketones, ur: NEGATIVE mg/dL
Nitrite: NEGATIVE
Protein, ur: NEGATIVE mg/dL
Specific Gravity, Urine: 1.006 (ref 1.005–1.030)
WBC, UA: >50 WBC/hpf (ref 0–5)
pH: 7 (ref 5.0–8.0)

## 2024-06-10 LAB — COMPREHENSIVE METABOLIC PANEL WITH GFR
ALT: 16 U/L (ref 0–44)
AST: 19 U/L (ref 15–41)
Albumin: 3.8 g/dL (ref 3.5–5.0)
Alkaline Phosphatase: 95 U/L (ref 38–126)
Anion gap: 9 (ref 5–15)
BUN: 20 mg/dL (ref 8–23)
CO2: 27 mmol/L (ref 22–32)
Calcium: 9.5 mg/dL (ref 8.9–10.3)
Chloride: 104 mmol/L (ref 98–111)
Creatinine, Ser: 1.48 mg/dL — ABNORMAL HIGH (ref 0.44–1.00)
GFR, Estimated: 35 mL/min — ABNORMAL LOW (ref >60)
Glucose, Bld: 107 mg/dL — ABNORMAL HIGH (ref 70–99)
Potassium: 4.4 mmol/L (ref 3.5–5.1)
Sodium: 140 mmol/L (ref 135–145)
Total Bilirubin: 1 mg/dL (ref 0.0–1.2)
Total Protein: 6.6 g/dL (ref 6.5–8.1)

## 2024-06-10 LAB — URINE DRUG SCREEN
Amphetamines: NEGATIVE
Barbiturates: NEGATIVE
Benzodiazepines: NEGATIVE
Cocaine: NEGATIVE
Fentanyl: NEGATIVE
Methadone Scn, Ur: NEGATIVE
Opiates: NEGATIVE
Tetrahydrocannabinol: NEGATIVE

## 2024-06-10 LAB — CBC WITH DIFFERENTIAL/PLATELET
Abs Immature Granulocytes: 0.06 K/uL (ref 0.00–0.07)
Basophils Absolute: 0 K/uL (ref 0.0–0.1)
Basophils Relative: 0 %
Eosinophils Absolute: 0 K/uL (ref 0.0–0.5)
Eosinophils Relative: 0 %
HCT: 41 % (ref 36.0–46.0)
Hemoglobin: 12.9 g/dL (ref 12.0–15.0)
Immature Granulocytes: 1 %
Lymphocytes Relative: 13 %
Lymphs Abs: 1.1 K/uL (ref 0.7–4.0)
MCH: 27.9 pg (ref 26.0–34.0)
MCHC: 31.5 g/dL (ref 30.0–36.0)
MCV: 88.6 fL (ref 80.0–100.0)
Monocytes Absolute: 0.5 K/uL (ref 0.1–1.0)
Monocytes Relative: 6 %
Neutro Abs: 6.6 K/uL (ref 1.7–7.7)
Neutrophils Relative %: 80 %
Platelets: 211 K/uL (ref 150–400)
RBC: 4.63 MIL/uL (ref 3.87–5.11)
RDW: 13.2 % (ref 11.5–15.5)
WBC: 8.3 K/uL (ref 4.0–10.5)
nRBC: 0 % (ref 0.0–0.2)

## 2024-06-10 LAB — ETHANOL: Alcohol, Ethyl (B): <15 mg/dL (ref <15)

## 2024-06-10 LAB — AMMONIA: Ammonia: 23 umol/L (ref 9–35)

## 2024-06-10 MED ORDER — EZETIMIBE 10 MG PO TABS
10.0000 mg | ORAL_TABLET | Freq: Every day | ORAL | Status: DC
  Administered 2024-06-10 – 2024-06-14 (×5): 10 mg via ORAL
  Filled 2024-06-10 (×5): qty 1

## 2024-06-10 MED ORDER — PANTOPRAZOLE SODIUM 40 MG PO TBEC
40.0000 mg | DELAYED_RELEASE_TABLET | Freq: Every day | ORAL | Status: DC
  Administered 2024-06-10 – 2024-06-14 (×5): 40 mg via ORAL
  Filled 2024-06-10 (×5): qty 1

## 2024-06-10 MED ORDER — DORZOLAMIDE HCL 2 % OP SOLN
1.0000 [drp] | Freq: Two times a day (BID) | OPHTHALMIC | Status: DC
  Administered 2024-06-10 – 2024-06-14 (×7): 1 [drp] via OPHTHALMIC
  Filled 2024-06-10 (×2): qty 10

## 2024-06-10 MED ORDER — FOSFOMYCIN TROMETHAMINE 3 G PO PACK
3.0000 g | PACK | Freq: Once | ORAL | Status: AC
  Administered 2024-06-10: 3 g via ORAL
  Filled 2024-06-10: qty 3

## 2024-06-10 MED ORDER — FUROSEMIDE 40 MG PO TABS
40.0000 mg | ORAL_TABLET | Freq: Every day | ORAL | Status: DC | PRN
Start: 2024-06-10 — End: 2024-06-14

## 2024-06-10 MED ORDER — DOCUSATE SODIUM 100 MG PO CAPS
100.0000 mg | ORAL_CAPSULE | Freq: Once | ORAL | Status: AC
  Administered 2024-06-11: 100 mg via ORAL
  Filled 2024-06-10: qty 1

## 2024-06-10 MED ORDER — QUETIAPINE FUMARATE 50 MG PO TABS
25.0000 mg | ORAL_TABLET | Freq: Once | ORAL | Status: AC
  Administered 2024-06-10: 25 mg via ORAL
  Filled 2024-06-10: qty 1

## 2024-06-10 MED ORDER — ACETAMINOPHEN 325 MG PO TABS
650.0000 mg | ORAL_TABLET | Freq: Four times a day (QID) | ORAL | Status: DC | PRN
  Administered 2024-06-10 – 2024-06-11 (×2): 650 mg via ORAL
  Filled 2024-06-10 (×2): qty 2

## 2024-06-10 MED ORDER — ASPIRIN 81 MG PO TBEC
81.0000 mg | DELAYED_RELEASE_TABLET | Freq: Every day | ORAL | Status: DC
  Administered 2024-06-10 – 2024-06-13 (×4): 81 mg via ORAL
  Filled 2024-06-10 (×4): qty 1

## 2024-06-10 MED ORDER — BUSPIRONE HCL 5 MG PO TABS
15.0000 mg | ORAL_TABLET | Freq: Two times a day (BID) | ORAL | Status: DC
  Administered 2024-06-10 – 2024-06-14 (×8): 15 mg via ORAL
  Filled 2024-06-10 (×6): qty 1
  Filled 2024-06-10: qty 2
  Filled 2024-06-10: qty 1
  Filled 2024-06-10: qty 2

## 2024-06-10 MED ORDER — POLYETHYLENE GLYCOL 3350 17 G PO PACK
17.0000 g | PACK | Freq: Every day | ORAL | Status: DC
  Administered 2024-06-11 – 2024-06-14 (×4): 17 g via ORAL
  Filled 2024-06-10 (×5): qty 1

## 2024-06-10 MED ORDER — DIVALPROEX SODIUM ER 250 MG PO TB24
250.0000 mg | ORAL_TABLET | Freq: Every day | ORAL | Status: DC
  Administered 2024-06-10 – 2024-06-13 (×4): 250 mg via ORAL
  Filled 2024-06-10 (×5): qty 1

## 2024-06-10 MED ORDER — LEVOTHYROXINE SODIUM 150 MCG PO TABS
150.0000 ug | ORAL_TABLET | Freq: Every day | ORAL | Status: DC
  Administered 2024-06-11: 150 ug via ORAL
  Filled 2024-06-10: qty 1

## 2024-06-10 MED ORDER — ESCITALOPRAM OXALATE 20 MG PO TABS
20.0000 mg | ORAL_TABLET | Freq: Every day | ORAL | Status: DC
  Administered 2024-06-10 – 2024-06-14 (×5): 20 mg via ORAL
  Filled 2024-06-10 (×4): qty 1
  Filled 2024-06-10: qty 2

## 2024-06-10 MED ORDER — QUETIAPINE FUMARATE 25 MG PO TABS
25.0000 mg | ORAL_TABLET | Freq: Every day | ORAL | Status: DC
  Administered 2024-06-10 – 2024-06-13 (×4): 25 mg via ORAL
  Filled 2024-06-10 (×4): qty 1

## 2024-06-10 MED ORDER — ALPRAZOLAM 0.5 MG PO TABS
0.5000 mg | ORAL_TABLET | Freq: Every day | ORAL | Status: DC | PRN
Start: 2024-06-10 — End: 2024-06-14
  Administered 2024-06-10 – 2024-06-13 (×4): 0.5 mg via ORAL
  Filled 2024-06-10 (×5): qty 1

## 2024-06-10 MED ORDER — METOPROLOL SUCCINATE ER 25 MG PO TB24
25.0000 mg | ORAL_TABLET | Freq: Every day | ORAL | Status: DC
  Administered 2024-06-10 – 2024-06-14 (×5): 25 mg via ORAL
  Filled 2024-06-10 (×5): qty 1
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
## 2024-06-10 NOTE — ED Notes (Addendum)
 Placed patient's jewelry in specimen cup with patient label with patient's clothing in a belonging bag. Jewelry includes 4 bracelets, 1 anklet, 3 rings, 1 necklace with charm and 1 pair of ear rings.   2202 - patient walked to BR with standby assist. She tolerated well. Patient dressed out in scrubs and belongings with jewelry in patient belonging bag and placed in locker 28.

## 2024-06-10 NOTE — ED Notes (Signed)
 Pt in xray

## 2024-06-10 NOTE — Progress Notes (Signed)
 ICM consulted. Per chart review, TTS is also consulted. ICM will follow for psych assessment and clearance.

## 2024-06-10 NOTE — ED Provider Notes (Signed)
 [place] EMERGENCY DEPARTMENT AT [facility] Provider Note  CSN: [id number] Arrival date & time: 06/10/24 1702  Chief Complaint(s) social work  HPI Deanna [name] is a 83 y.o. female with history of dementia who is here today because her daughter is concerned about her mental status.  Patient reportedly has been increasingly confused, paranoid.  She believes that her daughter is an impostor, and people are trying to harm her.  Patient was hospitalized here, underwent psychiatric evaluation, and family pursued outpatient placement.  Unfortunately they have not been successful in finding appropriate placement.   Past Medical History 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   CKD (chronic kidney disease), stage IV (HCC)    Cognitive impairment    Colon polyps    Community acquired pneumonia    Depression    Diverticulosis    Dysuria    Encephalomalacia    GAD (generalized anxiety disorder)    GERD (gastroesophageal reflux disease)    Glaucoma    Hearing loss    Hemorrhoids    Hypertension    Hypothyroidism    Incontinence    Mixed hyperlipidemia    Myocardial infarction (HCC)    NSTEMI (non-ST elevated myocardial infarction) (HCC) 12/19/2011   OSA on CPAP    PAF (paroxysmal atrial fibrillation) (HCC)    Peripheral arterial disease    Pneumonitis 04/26/2015   Pyelonephritis due to Escherichia coli 04/28/2015   Sepsis (HCC)    Spondylosis of lumbar region without myelopathy or radiculopathy    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HCC)    Urticaria    Vertigo, benign positional    Patient Active Problem List   Diagnosis Date Noted   Dementia with behavioral  disturbance (HCC) 04/28/2024   Altered mental status 04/24/2024   Psychosis (HCC) 04/24/2024   Acute CVA (cerebrovascular accident) (HCC) 12/20/2023   Hypoxia 12/16/2023   Carotid artery stenosis, asymptomatic, right 12/13/2023   Left arm pain 08/17/2023   Delirium 08/17/2023   UTI (urinary tract infection) 08/16/2023   Anxiety 04/24/2023   Acute respiratory failure with hypoxia (HCC) 06/17/2022   COVID-19 virus infection 06/17/2022   Stage 3b chronic kidney disease (CKD) (HCC) - baseline SCr 1.8 06/17/2022   PAD (peripheral artery disease) 10/11/2021   PAF (paroxysmal atrial fibrillation) (HCC) 10/11/2021   Hypokalemia 05/09/2021   Status post coronary artery stent placement    Overactive bladder 12/16/2019   Generalized anxiety disorder 06/17/2018   Mood disorder 06/17/2018   Chronic diastolic CHF (congestive heart failure) (HCC) 11/16/2017   Primary open angle glaucoma of both eyes, moderate stage 11/09/2017   Risk for falls 11/22/2016   Hearing disorder 11/03/2016   Spondylosis of lumbar region without myelopathy or radiculopathy 02/25/2016   History of traumatic brain injury 11/04/2015   Primary osteoarthritis of both hips 08/17/2015   Cognitive impairment 07/08/2015   OSA on CPAP 04/26/2015   Chronic pain 04/26/2015   Physical deconditioning 04/26/2015   Hyperlipidemia 07/02/2014   Carotid artery stenosis 03/26/2012   Shortness of breath 12/19/2011    Class: Acute   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 Authorizing Provider  albuterol  (VENTOLIN  HFA) 108 (90 Base) MCG/ACT inhaler Inhale 2 puffs into the lungs every 6 (six) hours as needed for wheezing or shortness of breath. 09/12/23  Yes Sherlynn Madden, MD  aspirin  EC 81 MG tablet Take 81 mg by mouth at bedtime.   Yes [provider]  busPIRone  (BUSPAR ) 15 MG tablet Take 1 tablet (15 mg total) by mouth 2 (two) times daily. 11/16/22 08/08/48 Yes Franchot [name] SQUIBB, PMHNP  clopidogrel  (PLAVIX ) 75 MG tablet Take 75 mg by mouth daily.   Yes [provider]  divalproex  (DEPAKOTE  ER) 250 MG 24 hr tablet Take 1 tablet (250 mg total) by mouth at bedtime. 05/01/24  Yes Medina-Vargas, Monina C, NP  dorzolamide  (TRUSOPT ) 2 % ophthalmic solution Place 1 drop into both eyes in the morning and at bedtime.   Yes [provider]  escitalopram  (LEXAPRO ) 20 MG tablet Take 1 tablet (20 mg total) by mouth daily. Patient taking differently: Take 20 mg by mouth daily at 12 noon. 05/01/24  Yes Medina-Vargas, Monina C, NP  ezetimibe  (ZETIA ) 10 MG tablet TAKE 1 TABLET(10 MG) BY MOUTH DAILY Patient taking differently: Take 10 mg by mouth in the morning. 03/14/24  Yes Caro [name] POUR, NP  furosemide  (LASIX ) 40 MG tablet Take 1 tablet (40 mg total) by mouth daily as needed for fluid or edema. 12/14/23  Yes Rhyne, Samantha J, PA-C  gabapentin  (NEURONTIN ) 100 MG capsule Take 200 mg by mouth daily at 12 noon.   Yes [provider]  latanoprost  (XALATAN ) 0.005 % ophthalmic solution Place 1 drop into both eyes at bedtime.   Yes [provider]  levothyroxine  (SYNTHROID ) 150 MCG tablet Take 1 tablet (150 mcg total) by mouth daily at 6 (six) AM. 05/01/24  Yes Medina-Vargas, Monina C, NP  meclizine  (ANTIVERT ) 25 MG tablet Take 25 mg by mouth at bedtime.   Yes [provider]  metoprolol  succinate (TOPROL -XL) 25 MG 24 hr tablet Take 1 tablet (25 mg total) by mouth daily. Patient taking differently: Take 25 mg by  mouth at bedtime. 11/29/23  Yes Caro [name] POUR, NP  nitroGLYCERIN  (NITROSTAT ) 0.4 MG SL tablet Place 0.4 mg under the tongue every 5 (five) minutes as needed for chest pain.   Yes [provider]  pantoprazole  (PROTONIX ) 40 MG tablet Take 1 tablet (40 mg total) by mouth daily. Patient taking differently: Take 40 mg by mouth daily before breakfast. 09/12/23  Yes Sherlynn Madden, MD  QUEtiapine  (SEROQUEL ) 25 MG tablet Take 1 tablet (25 mg total) by mouth at bedtime. Would wean as tolerated 05/01/24  Yes Medina-Vargas, Monina C, NP  rosuvastatin  (CRESTOR ) 40 MG tablet Take 40 mg by mouth daily.   Yes [provider]  TYLENOL  500 MG tablet Take 500-750 mg by mouth every 8 (eight) hours as needed for mild pain (pain score 1-3) (or headaches).   Yes [provider]  ALPRAZolam  (XANAX ) 0.5 MG tablet Take 1 tablet (0.5 mg total) by mouth daily as needed for up to 2 doses for anxiety. Patient not taking: Reported on 06/10/2024 12/24/23   Perri DELENA Mau  Mickey., MD                                                                                                                                    Past Surgical History Past Surgical History:  Procedure Laterality Date   ABDOMINAL AORTOGRAM W/LOWER EXTREMITY N/A 08/22/2017   Procedure: ABDOMINAL AORTOGRAM W/LOWER EXTREMITY;  Surgeon: Serene Gaile ORN, MD;  Location: [facility] INVASIVE CV LAB;  Service: Cardiovascular;  Laterality: N/A;   BILATERAL UPPER EXTREMITY ANGIOGRAM N/A 09/11/2012   Procedure: BILATERAL UPPER EXTREMITY ANGIOGRAM;  Surgeon: Gaile ORN Serene, MD;  Location: [facility] CATH LAB;  Service: Cardiovascular;  Laterality: N/A;   BIOPSY  03/25/2020   Procedure: BIOPSY;  Surgeon: Wilhelmenia Aloha Mickey., MD;  Location: [facility] ENDOSCOPY;  Service: Gastroenterology;;   carotid duplex doppler Bilateral 09/03/2012, 11/03/2011   Evidence of 40%-59% bilateral internal carotid artery stenosis; however, velocities may be underestimated due to  calcific plaque with acoustic shadowing which makes doppler interrogation difficult. patent left common carotid- subclavian artery bypass with turbulent flow noted at the anastomosis with velocities of 295 cm/s   CAROTID-SUBCLAVIAN BYPASS GRAFT  12/15/2011   Procedure: BYPASS GRAFT CAROTID-SUBCLAVIAN;  Surgeon: Gaile ORN Serene, MD;  Location: [facility] OR;  Service: Vascular;  Laterality: Left;  Left Carotid subclavian bypass   CORONARY ANGIOPLASTY     CORONARY ARTERY BYPASS GRAFT     CORONARY ATHERECTOMY N/A 03/19/2020   Procedure: CORONARY ATHERECTOMY;  Surgeon: Dann Candyce RAMAN, MD;  Location: [facility] INVASIVE CV LAB;  Service: Cardiovascular;  Laterality: N/A;   CORONARY STENT INTERVENTION N/A 03/19/2020   Procedure: CORONARY STENT INTERVENTION;  Surgeon: Dann Candyce RAMAN, MD;  Location: [facility] INVASIVE CV LAB;  Service: Cardiovascular;  Laterality: N/A;   DOPPLER ECHOCARDIOGRAPHY  05/27/2010, 09/17/2008   Mild Proximal septal thickening is noted. Left ventricular systolic functions is normal ejection fraction =>55%. the aortic valve appears to be mildly sclerotic    ENDARTERECTOMY Right 12/13/2023   Procedure: ENDARTERECTOMY, CAROTID;  Surgeon: Serene Gaile ORN, MD;  Location: [facility] OR;  Service: Vascular;  Laterality: Right;   ESOPHAGOGASTRODUODENOSCOPY (EGD) WITH PROPOFOL  N/A 03/25/2020   Procedure: ESOPHAGOGASTRODUODENOSCOPY (EGD) WITH PROPOFOL ;  Surgeon: Wilhelmenia Aloha Mickey., MD;  Location: [facility] ENDOSCOPY;  Service: Gastroenterology;  Laterality: N/A;   fem-fem bypass graft  1999   holter monitor  01/21/2008   The predominant rhythm was normal sinus rhythm. Minimum heartrate of 63 bpm at +01:00, maximum heartrate of 105 bpm at + 10:35; and the average heartrate of 75 bpm. Ventricular ectopic activity totaled 1270: Multifocal; 866-PVC's and 404-VEs              JOINT REPLACEMENT     Left knee   LEFT HEART CATH AND CORS/GRAFTS ANGIOGRAPHY N/A 03/19/2020   Procedure: LEFT HEART CATH AND CORS/GRAFTS  ANGIOGRAPHY;  Surgeon: Dann Candyce RAMAN, MD;  Location: [facility] INVASIVE CV LAB;  Service: Cardiovascular;  Laterality: N/A;   LEFT HEART CATHETERIZATION  WITH CORONARY/GRAFT ANGIOGRAM N/A 12/21/2011   Procedure: LEFT HEART CATHETERIZATION WITH EL BILE;  Surgeon: Alm LELON Clay, MD;  Location: [facility] CATH LAB;  Service: Cardiovascular;  Laterality: N/A;   NM MYOCAR PERF EJECTION FRACTION  09/22/2009, 07/03/2007   the post stress myocardial perfusion images show a normal pattern of perfusion is all regions. The post-stress ejection fraction is 68 %. no significant wall motion abnormalities noted. This is a low risk scan.   PATCH ANGIOPLASTY Right 12/13/2023   Procedure: ANGIOPLASTY, USING PATCH GRAFT;  Surgeon: Serene Gaile LELON, MD;  Location: [facility] OR;  Service: Vascular;  Laterality: Right;   PERIPHERAL VASCULAR INTERVENTION  08/22/2017   Procedure: PERIPHERAL VASCULAR INTERVENTION;  Surgeon: Serene Gaile LELON, MD;  Location: [facility] INVASIVE CV LAB;  Service: Cardiovascular;;  Fem/Fem Graft   REPLACEMENT TOTAL KNEE  05-2011   UNILATERAL UPPER EXTREMEITY ANGIOGRAM Left 11/15/2011   Procedure: UNILATERAL UPPER VENITA BILE;  Surgeon: Dorn JINNY Lesches, MD;  Location: [facility] CATH LAB;  Service: Cardiovascular;  Laterality: Left;   Family History Family History  Problem Relation Age of Onset   Heart attack Mother    Heart disease Mother        before age 68   Diabetes Father    Heart disease Father    Hypertension Father    Hyperlipidemia Father    Heart attack Father 44   Heart attack Brother 59   Cerebral palsy Sister 8   Congestive Heart Failure Sister 42   Heart attack Sister 37   Hypertension Sister 19   Dementia Sister    Anxiety disorder Sister    Anxiety disorder Sister 21   Heart Problems Sister    Stroke Sister    Heart Problems Sister 8   Heart attack Sister 41   Colon cancer Brother 59   Prostate cancer Brother 76   Hypertension Daughter    Irritable bowel syndrome  Daughter    Depression Daughter    Anxiety disorder Daughter     Social History Social History   Tobacco Use   Smoking status: Former    Current packs/day: 0.00    Types: Cigarettes    Start date: 11/28/1978    Quit date: 11/27/1981    Years since quitting: 42.5   Smokeless tobacco: Never  Vaping Use   Vaping status: Never Used  Substance Use Topics   Alcohol use: Not Currently    Alcohol/week: 0.0 standard drinks of alcohol   Drug use: No   Allergies Lidocaine , Cymbalta  [duloxetine  hcl], Donepezil, Mobic [meloxicam], Namenda [memantine], Nsaids, Other, Oxycontin  [oxycodone ], and Vicodin [hydrocodone-acetaminophen ]  Review of Systems Review of Systems  Physical Exam Vital Signs  I have reviewed the triage vital signs BP (!) 147/65 (BP Location: Right Arm)   Pulse 74   Temp 98.2 F (36.8 C) (Oral)   Resp 18   Ht 5' 1 (1.549 m)   Wt 75.6 kg   SpO2 94%   BMI 31.49 kg/m   Physical Exam Vitals and nursing note reviewed.  HENT:     Head: Normocephalic and atraumatic.  Eyes:     Pupils: Pupils are equal, round, and reactive to light.  Cardiovascular:     Rate and Rhythm: Normal rate.  Pulmonary:     Effort: Pulmonary effort is normal.  Abdominal:     General: Abdomen is flat. There is no distension.     Tenderness: There is no abdominal tenderness.  Musculoskeletal:        General: Normal  range of motion.     Cervical back: Normal range of motion.  Skin:    General: Skin is warm.  Neurological:     General: No focal deficit present.     Mental Status: She is alert.     Comments: Patient is oriented to self.  Unable to tell me the year day or day of the week.     ED Results and Treatments Labs (all labs ordered are listed, but only abnormal results are displayed) Labs Reviewed  COMPREHENSIVE METABOLIC PANEL WITH GFR - Abnormal; Notable for the following components:      Result Value   Glucose, Bld 107 (*)    Creatinine, Ser 1.48 (*)    GFR, Estimated  35 (*)    All other components within normal limits  URINALYSIS, W/ REFLEX TO CULTURE (INFECTION SUSPECTED) - Abnormal; Notable for the following components:   Color, Urine STRAW (*)    APPearance HAZY (*)    Hgb urine dipstick SMALL (*)    Leukocytes,Ua MODERATE (*)    Bacteria, UA RARE (*)    All other components within normal limits  URINE CULTURE  ETHANOL  URINE DRUG SCREEN  CBC WITH DIFFERENTIAL/PLATELET  AMMONIA                                                                                                                          Radiology CT PELVIS WO CONTRAST Result Date: 06/10/2024 CLINICAL DATA:  Pelvis pain. EXAM: CT PELVIS WITHOUT CONTRAST TECHNIQUE: Multidetector CT imaging of the pelvis was performed following the standard protocol without intravenous contrast. RADIATION DOSE REDUCTION: This exam was performed according to the departmental dose-optimization program which includes automated exposure control, adjustment of the mA and/or kV according to patient size and/or use of iterative reconstruction technique. COMPARISON:  March 21, 2020 FINDINGS: Urinary Tract: There is moderate to marked severity diffuse urinary bladder wall thickening. A mild amount of surrounding inflammatory fat stranding is seen. Bowel: There is no evidence of bowel dilatation. Noninflamed diverticula are noted throughout the sigmoid colon. A large amount of retained stool is seen within the distal sigmoid colon and rectum. Vascular/Lymphatic: There is evidence of marked severity atherosclerotic disease involving the visualized portion of the abdominal aorta and bilateral common iliac arteries. Evidence of prior fem-fem bypass graft is noted. Reproductive: A small, stable parenchymal calcification is noted within the uterus on the left. The bilateral adnexa are unremarkable. Other: A 4.4 cm cystic appearing structure (approximately 34.91 Hounsfield units) is seen within the mid to lower left abdomen.  Musculoskeletal: There is a chronic fracture deformity of the left inferior pubic ramus. No acute osseous abnormalities are identified. Numerous subcentimeter lytic areas are seen scattered throughout the lower pelvis and bilateral femurs. This is seen on the prior study and is more prominently noted on the current exam. Marked severity degenerative changes are noted within the bilateral hips and visualized portion of the lower lumbar spine. IMPRESSION: 1. Findings consistent with  moderate to marked severity cystitis. Correlation with urinalysis is recommended. 2. Sigmoid diverticulosis. 3. Large amount of retained stool within the distal sigmoid colon and rectum. 4. Numerous subcentimeter lytic areas scattered throughout the pelvis and bilateral femurs, as described above. While this may represent sequelae associated with multiple myeloma, correlation with a nuclear medicine bone scan is recommended to exclude the presence of an underlying neoplastic process. 5. Marked severity degenerative changes within the bilateral hips and visualized portion of the lower lumbar spine. 6. Aortic atherosclerosis. Electronically Signed   By: Suzen Dials M.D.   On: 06/10/2024 23:01   CT Head Wo Contrast Result Date: 06/10/2024 CLINICAL DATA:  Ataxia. EXAM: CT HEAD WITHOUT CONTRAST TECHNIQUE: Contiguous axial images were obtained from the base of the skull through the vertex without intravenous contrast. RADIATION DOSE REDUCTION: This exam was performed according to the departmental dose-optimization program which includes automated exposure control, adjustment of the mA and/or kV according to patient size and/or use of iterative reconstruction technique. COMPARISON:  March 29, 2024 FINDINGS: Brain: There is generalized cerebral atrophy with widening of the extra-axial spaces and ventricular dilatation. There are areas of decreased attenuation within the white matter tracts of the supratentorial brain, consistent with  microvascular disease changes. 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 Vascular: No hyperdense vessel or unexpected calcification. Skull: Normal. Negative for fracture or focal lesion. Sinuses/Orbits: No acute finding. Other: None. IMPRESSION: 1. Generalized cerebral atrophy and microvascular disease changes of the supratentorial brain. 2. 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 3. No acute intracranial abnormality. Electronically Signed   By: Suzen Dials M.D.   On: 06/10/2024 20:41   DG Hip Unilat With Pelvis 2-3 Views Left Result Date: 06/10/2024 CLINICAL DATA:  pain EXAM: DG HIP (WITH OR WITHOUT PELVIS) 2-3V LEFT COMPARISON:  None Available. FINDINGS: Osteopenia.No evidence of pelvic fracture or diastasis.No acute hip fracture or dislocation.Severe bilateral hip joint space loss with bony sclerosis and osteophyte formation. Multilevel degenerative disc disease of the lumbar spine. Soft tissues are unremarkable. IMPRESSION: 1. No acute, displaced fracture or dislocation was visualized. However, underlying osteopenia decreases the sensitivity for nondisplaced fracture using plain radiography. If the patient is unable to bear weight, or there is high clinical suspicion, a follow-up CT should be considered for further evaluation. 2. Severe bilateral hip joint osteoarthritis Electronically Signed   By: Rogelia Myers M.D.   On: 06/10/2024 19:06    Pertinent labs & imaging results that were available during my care of the patient were reviewed by me and considered in my medical decision making (see MDM for details).  Medications Ordered in ED Medications  ALPRAZolam  (XANAX ) tablet 0.5 mg (0.5 mg Oral Given 06/10/24 2149)  aspirin  EC tablet 81 mg (81 mg Oral Given 06/10/24 2148)  busPIRone  (BUSPAR ) tablet 15 mg (15 mg Oral Given 06/10/24 2148)  divalproex  (DEPAKOTE  ER) 24 hr tablet 250 mg (250 mg Oral Given 06/10/24 2149)  dorzolamide  (TRUSOPT ) 2 %  ophthalmic solution 1 drop (1 drop Both Eyes Given 06/10/24 2150)  escitalopram  (LEXAPRO ) tablet 20 mg (20 mg Oral Given 06/10/24 1802)  ezetimibe  (ZETIA ) tablet 10 mg (10 mg Oral Given 06/10/24 1802)  furosemide  (LASIX ) tablet 40 mg (has no administration in time range)  levothyroxine  (SYNTHROID ) tablet 150 mcg (has no administration in time range)  metoprolol  succinate (TOPROL -XL) 24 hr tablet 25 mg (25 mg Oral Given 06/10/24 1802)  pantoprazole  (PROTONIX ) EC tablet 40 mg (40 mg Oral Given 06/10/24 1802)  QUEtiapine  (SEROQUEL ) tablet 25 mg (25 mg Oral Given 06/10/24 2148)  acetaminophen  (TYLENOL ) tablet 650 mg (650 mg Oral Given 06/10/24 2207)  polyethylene glycol (MIRALAX  / GLYCOLAX ) packet 17 g (has no administration in time range)  docusate sodium  (COLACE) capsule 100 mg (has no administration in time range)  QUEtiapine  (SEROQUEL ) tablet 25 mg (25 mg Oral Given 06/10/24 1802)  fosfomycin (MONUROL) packet 3 g (3 g Oral Given 06/10/24 2147)                                                                                                                                     Procedures Procedures  (including critical care time)  Medical Decision Making / ED Course   This patient presents to the ED for concern of altered mental status, dementia, this involves an extensive number of treatment options, and is a complaint that carries with it a high risk of complications and morbidity.  The differential diagnosis includes dementia, metabolic abnormalities, SDH, UTI, polypharmacy.  MDM: On assessment of the patient, she is alert, talkative.  She is very easily redirectable.  Patient is very concerned that her daughter is a fake person, that looks exactly like her daughter and is trying to hurt her.  There is no evidence of harm to the patient.  I am able to redirect the patient, have very light conversations with her which she seems to enjoy.  She does seem a bit paranoid.  Will perform broad medical  workup on the patient and attempt to medically clear the patient.  Ultimately, I believe this is likely patient's dementia and she will require [facility] psych consultation and placement.  Reassessment 11:20 PM-patient does have a UTI, treated with fosfomycin.  CT imaging of her pelvis does show multiple lytic lesions, concerning for multiple myeloma.  I do not believe I can reasonably medically clear the patient at this time.  Will attempt to admit patient to hospitalist.   Additional history obtained:  -External records from outside source obtained and reviewed including: Chart review including previous notes, labs, imaging, consultation notes   Lab Tests: -I ordered, reviewed, and interpreted labs.   The pertinent results include:   Labs Reviewed  COMPREHENSIVE METABOLIC PANEL WITH GFR - Abnormal; Notable for the following components:      Result Value   Glucose, Bld 107 (*)    Creatinine, Ser 1.48 (*)    GFR, Estimated 35 (*)    All other components within normal limits  URINALYSIS, W/ REFLEX TO CULTURE (INFECTION SUSPECTED) - Abnormal; Notable for the following components:   Color, Urine STRAW (*)    APPearance HAZY (*)    Hgb urine dipstick SMALL (*)    Leukocytes,Ua MODERATE (*)    Bacteria, UA RARE (*)    All other components within normal limits  URINE CULTURE  ETHANOL  URINE DRUG SCREEN  CBC WITH DIFFERENTIAL/PLATELET  AMMONIA  Imaging Studies ordered: I ordered imaging studies including CT head, CT pelvis I independently visualized and interpreted imaging. I agree with the radiologist interpretation   Medicines ordered and prescription drug management: Meds ordered this encounter  Medications   ALPRAZolam  (XANAX ) tablet 0.5 mg   aspirin  EC tablet 81 mg   busPIRone  (BUSPAR ) tablet 15 mg   divalproex  (DEPAKOTE  ER) 24 hr tablet 250 mg   dorzolamide  (TRUSOPT ) 2 % ophthalmic solution 1 drop   escitalopram  (LEXAPRO ) tablet 20 mg   ezetimibe  (ZETIA ) tablet 10 mg    furosemide  (LASIX ) tablet 40 mg   levothyroxine  (SYNTHROID ) tablet 150 mcg   metoprolol  succinate (TOPROL -XL) 24 hr tablet 25 mg   pantoprazole  (PROTONIX ) EC tablet 40 mg   QUEtiapine  (SEROQUEL ) tablet 25 mg   QUEtiapine  (SEROQUEL ) tablet 25 mg   fosfomycin (MONUROL) packet 3 g   acetaminophen  (TYLENOL ) tablet 650 mg   polyethylene glycol (MIRALAX  / GLYCOLAX ) packet 17 g   docusate sodium  (COLACE) capsule 100 mg    -I have reviewed the patients home medicines and have made adjustments as needed   Cardiac Monitoring: The patient was maintained on a cardiac monitor.  I personally viewed and interpreted the cardiac monitored which showed an underlying rhythm of: Normal sinus rhythm  Social Determinants of Health:  Factors impacting patients care include: Lack of access to primary care   Reevaluation: After the interventions noted above, I reevaluated the patient and found that they have :improved  Co morbidities that complicate the patient evaluation  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   CKD (chronic kidney disease), stage IV (HCC)    Cognitive impairment    Colon polyps    Community acquired pneumonia    Depression    Diverticulosis    Dysuria    Encephalomalacia    GAD (generalized anxiety disorder)    GERD (gastroesophageal reflux disease)    Glaucoma    Hearing loss    Hemorrhoids    Hypertension    Hypothyroidism    Incontinence    Mixed hyperlipidemia    Myocardial infarction (HCC)    NSTEMI (non-ST elevated myocardial infarction) (HCC) 12/19/2011   OSA on CPAP    PAF (paroxysmal atrial fibrillation) (HCC)    Peripheral arterial disease    Pneumonitis  04/26/2015   Pyelonephritis due to Escherichia coli 04/28/2015   Sepsis (HCC)    Spondylosis of lumbar region without myelopathy or radiculopathy    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HCC)    Urticaria    Vertigo, benign positional         Final Clinical Impression(s) / ED Diagnoses Final diagnoses:  Cystitis  Dementia, unspecified dementia severity, unspecified dementia type, unspecified whether behavioral, psychotic, or mood disturbance or anxiety (HCC)     @PCDICTATION @    Mannie Pac T, DO 06/10/24 2325

## 2024-06-10 NOTE — Consult Note (Signed)
 [facility] Consult Note  Patient Name: Deanna [name] MRN: [id number] DOB: Jun 30, 1941 DATE OF Consult: 06/10/2024  PRIMARY PSYCHIATRIC DIAGNOSES  1.  Major Neurocognitive Disorder with Behavioral Disturbance 2.  R/O Delirium Due to Another Medical Condition   RECOMMENDATIONS  Recommendations: Medication recommendations: Increase Seroquel  to 25 mg po BID (4pm and HS) for agitation/psychosis Non-Medication/therapeutic recommendations: R/O possible infection as potential cause, Valproic Acid  level  Is inpatient psychiatric hospitalization recommended for this patient? No (Explain why): No imminent danger to self or others - Can reassess tomorrow if needed Is another care setting recommended for this patient? (examples may include Crisis Stabilization Unit, Residential/Recovery Treatment, ALF/SNF, Memory Care Unit)  Yes (Explain why): ALF/SNF From a psychiatric perspective, is this patient appropriate for discharge to an outpatient setting/resource or other less restrictive environment for continued care?  Yes (Explain why): No imminent danger to self or others - can reassess tomorrow if needed Follow-Up Telepsychiatry C/L services: We will sign off for now. Please re-consult our service if needed for any concerning changes in the patient's condition, discharge planning, or questions. Communication: Treatment team members (and family members if applicable) who were involved in treatment/care discussions and planning, and with whom we spoke or engaged with via secure text/chat, include the following: Danika, RN; Dina; LCSW; Cathaleen, PMHNP; Luke, RN; Allean, RN  Thank you for involving us  in the care of this patient. If you have any additional questions or concerns, please call [phone] and ask for me or the provider on-call.  TELEPSYCHIATRY ATTESTATION & CONSENT  As the provider for this telehealth consult, I attest that I verified the patient's identity using two separate identifiers,  introduced myself to the patient, provided my credentials, disclosed my location, and performed this encounter via a HIPAA-compliant, real-time, face-to-face, two-way, interactive audio and video platform and with the full consent and agreement of the patient (or guardian as applicable.)  Patient physical location: [facility]. Telehealth provider physical location: home office in state of [place] .  Video start time: 1929 (Central Time) Video end time: 1950 (Central Time)  IDENTIFYING DATA  Deanna [name] is a 83 y.o. year-old female for whom a psychiatric consultation has been ordered by the primary provider. The patient was identified using two separate identifiers.  CHIEF COMPLAINT/REASON FOR CONSULT  I'm so scared  HISTORY OF PRESENT ILLNESS (HPI)  The patient presented to the ED with c/o increased agitation/aggression/paranoia.  Per chart, daughter brought pt in due to concern regarding her mental status.  Reported she reported increased confusion and paranoia.  Pt had a recent inpatient psychiatric admission for similar issues and family has attempted to find alternative placement with no success up to this point.   Upon evaluation, pt reported somebody has been after me.  Pt was able to identify that she was in the hospital and that the year is 2025 but became anxious when asked about the month and president.  Reported the woman who claims to be her daughter is an imposter stating I don't know her.  Reported they said they would kill me and when asked who, she stated that is what the person pretending to be her daughter told her.  Pt continued to express fear of someone getting in to the hospital to hurt her.  Pt does have a hx of dementia with behavioral disturbance and daughter apparently reported her sundowning is getting much worse.   At this time, would r/o any organic cause for AMS before referring to inpatient psychiatric facility  but would increase Seroquel  to 25 mg  po BID (4pm and HS) at this time to help with paranoia, delusions and agitation.  Recommend Valproic Acid  Level.  If needed, we can reassess tomorrow to determine if a higher level of psychiatric care is required.         PAST PSYCHIATRIC HISTORY  Previous inpatient psychiatric admission:  Yes, most recently in Sept 2025 Outpatient treatment: Psychiatry Previous medications: Unable to recall Hx of suicide attempt: Per chart, denied Hx of trauma/abuse:  Unable to assess Otherwise as per HPI above.  PAST MEDICAL HISTORY  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   CKD (chronic kidney disease), stage IV (HCC)    Cognitive impairment    Colon polyps    Community acquired pneumonia    Depression    Diverticulosis    Dysuria    Encephalomalacia    GAD (generalized anxiety disorder)    GERD (gastroesophageal reflux disease)    Glaucoma    Hearing loss    Hemorrhoids    Hypertension    Hypothyroidism    Incontinence    Mixed hyperlipidemia    Myocardial infarction (HCC)    NSTEMI (non-ST elevated myocardial infarction) (HCC) 12/19/2011   OSA on CPAP    PAF (paroxysmal atrial fibrillation) (HCC)    Peripheral arterial disease    Pneumonitis 04/26/2015   Pyelonephritis due to Escherichia coli 04/28/2015   Sepsis (HCC)    Spondylosis of lumbar region without myelopathy or radiculopathy    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HCC)    Urticaria    Vertigo, benign positional      HOME MEDICATIONS  Facility Ordered Medications  Medication   ALPRAZolam  (XANAX ) tablet 0.5 mg   aspirin  EC tablet 81 mg   busPIRone  (BUSPAR ) tablet 15 mg   divalproex  (DEPAKOTE  ER) 24 hr tablet 250 mg    dorzolamide  (TRUSOPT ) 2 % ophthalmic solution 1 drop   escitalopram  (LEXAPRO ) tablet 20 mg   ezetimibe  (ZETIA ) tablet 10 mg   furosemide  (LASIX ) tablet 40 mg   [START ON 06/11/2024] levothyroxine  (SYNTHROID ) tablet 150 mcg   metoprolol  succinate (TOPROL -XL) 24 hr tablet 25 mg   pantoprazole  (PROTONIX ) EC tablet 40 mg   QUEtiapine  (SEROQUEL ) tablet 25 mg   [COMPLETED] QUEtiapine  (SEROQUEL ) tablet 25 mg   PTA Medications  Medication Sig   dorzolamide  (TRUSOPT ) 2 % ophthalmic solution Place 1 drop into both eyes in the morning and at bedtime.   aspirin  EC 81 MG tablet Take 81 mg by mouth at bedtime.   busPIRone  (BUSPAR ) 15 MG tablet Take 1 tablet (15 mg total) by mouth 2 (two) times daily.   TYLENOL  500 MG tablet Take 500-1,000 mg by mouth 2 (two) times daily.   pantoprazole  (PROTONIX ) 40 MG tablet Take 1 tablet (40 mg total) by mouth daily.   albuterol  (VENTOLIN  HFA) 108 (90 Base) MCG/ACT inhaler Inhale 2 puffs into the lungs every 6 (six) hours as needed for wheezing or shortness of breath.   Cholecalciferol  (VITAMIN D3) 1.25 MG (50000 UT) CAPS Take 1 capsule by mouth once a week.   metoprolol  succinate (TOPROL -XL) 25 MG 24 hr tablet Take 1 tablet (25 mg total) by mouth daily.  furosemide  (LASIX ) 40 MG tablet Take 1 tablet (40 mg total) by mouth daily as needed for fluid or edema.   ALPRAZolam  (XANAX ) 0.5 MG tablet Take 1 tablet (0.5 mg total) by mouth daily as needed for up to 2 doses for anxiety.   ezetimibe  (ZETIA ) 10 MG tablet TAKE 1 TABLET(10 MG) BY MOUTH DAILY   divalproex  (DEPAKOTE  ER) 250 MG 24 hr tablet Take 1 tablet (250 mg total) by mouth at bedtime.   escitalopram  (LEXAPRO ) 20 MG tablet Take 1 tablet (20 mg total) by mouth daily.   QUEtiapine  (SEROQUEL ) 25 MG tablet Take 1 tablet (25 mg total) by mouth at bedtime. Would wean as tolerated   levothyroxine  (SYNTHROID ) 150 MCG tablet Take 1 tablet (150 mcg total) by mouth daily at 6 (six) AM.     ALLERGIES  Allergies  Allergen  Reactions   Lidocaine  Swelling and Other (See Comments)    Use CORRECT dose for age/weight- red welts and tongue swelling result, if not   Cymbalta  [Duloxetine  Hcl] Other (See Comments)    Increased confusion and memory concerns    Donepezil Other (See Comments)    Altered mood and Aggression     Mobic [Meloxicam] Other (See Comments)    Heart failure- cannot take   Namenda [Memantine] Other (See Comments)    Severe aggression   Nsaids Other (See Comments)    Cannot have due to heart issues   Other Other (See Comments)    No salt or anything that might cause fluid retention/swelling!!   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, however(??)   Vicodin [Hydrocodone-Acetaminophen ] Other (See Comments)    Delirium, Confusion, and Toxic dementia    SOCIAL & SUBSTANCE USE HISTORY  Social History   Socioeconomic History   Marital status: Divorced    Spouse name: Not on file   Number of children: Not on file   Years of education: Not on file   Highest education level: Not on file  Occupational History   Not on file  Tobacco Use   Smoking status: Former    Current packs/day: 0.00    Types: Cigarettes    Start date: 11/28/1978    Quit date: 11/27/1981    Years since quitting: 42.5   Smokeless tobacco: Never  Vaping Use   Vaping status: Never Used  Substance and Sexual Activity   Alcohol use: Not Currently    Alcohol/week: 0.0 standard drinks of alcohol   Drug use: No   Sexual activity: Not Currently    Comment: 1st intercourse 23 yo-1 partner  Other Topics Concern   Not on file  Social History Narrative   Social History      Diet? Healthy but too many sweets      Do you drink/eat things with caffeine? Yes occasionally      Marital status?                    D                What year were you married?      Do you live in a house, apartment, assisted living, condo, trailer, etc.? condo      Is it one or  more stories? 1      How many persons live in your home? 2      Do you have any pets in your home? (please list) 1 cat  Highest level of education completed? 1 year college      Current or past profession: housewife      Do you exercise?           no                           Type & how often?      Advanced Directives      Do you have a living will? no      Do you have a DNR form?             no                     If not, do you want to discuss one? yes      Do you have signed POA/HPOA for forms? no      Functional Status      Do you have difficulty bathing or dressing yourself? No- gets tired      Do you have difficulty preparing food or eating? No- eats frozen entrees or poor nutrition meals      Do you have difficulty managing your medications? yes      Do you have difficulty managing your finances? yes      Do you have difficulty affording your medications? No- funds running low   Social Drivers of Health   Financial Resource Strain: Not on file  Food Insecurity: Food Insecurity Present (12/16/2023)   Hunger Vital Sign    Worried About Running Out of Food in the Last Year: Often true    Ran Out of Food in the Last Year: Often true  Transportation Needs: No Transportation Needs (12/16/2023)   PRAPARE - Administrator, Civil Service (Medical): No    Lack of Transportation (Non-Medical): No  Physical Activity: Not on file  Stress: Not on file  Social Connections: Moderately Isolated (12/16/2023)   Social Connection and Isolation Panel    Frequency of Communication with Friends and Family: Three times a week    Frequency of Social Gatherings with Friends and Family: Once a week    Attends Religious Services: 1 to 4 times per year    Active Member of [employer] or Organizations: Patient unable to answer    Attends Banker Meetings: Never    Marital Status: Divorced   Social History   Tobacco Use  Smoking Status Former   Current packs/day:  0.00   Types: Cigarettes   Start date: 11/28/1978   Quit date: 11/27/1981   Years since quitting: 42.5  Smokeless Tobacco Never   Social History   Substance and Sexual Activity  Alcohol Use Not Currently   Alcohol/week: 0.0 standard drinks of alcohol   Social History   Substance and Sexual Activity  Drug Use No    Additional pertinent information lives with daughter.  FAMILY HISTORY  Family History  Problem Relation Age of Onset   Heart attack Mother    Heart disease Mother        before age 30   Diabetes Father    Heart disease Father    Hypertension Father    Hyperlipidemia Father    Heart attack Father 1   Heart attack Brother 12   Cerebral palsy Sister 16   Congestive Heart Failure Sister 8   Heart attack Sister 50   Hypertension Sister 41   Dementia Sister    Anxiety disorder Sister  Anxiety disorder Sister 47   Heart Problems Sister    Stroke Sister    Heart Problems Sister 42   Heart attack Sister 71   Colon cancer Brother 26   Prostate cancer Brother 67   Hypertension Daughter    Irritable bowel syndrome Daughter    Depression Daughter    Anxiety disorder Daughter    Family Psychiatric History (if known):  Per chart, sister - anxiety, depression, dementia; daughter - depression  MENTAL STATUS EXAM (MSE)  Mental Status Exam: General Appearance: Casual  Orientation:  Other:  x 2  Memory:  Immediate;   Poor Recent;   Poor Remote;   Poor  Concentration:  Concentration: Fair and Attention Span: Fair  Recall:  Poor  Attention  Fair  Eye Contact:  Good  Speech:  Normal Rate  Language:  Good  Volume:  Normal  Mood: Anxious  Affect:  Constricted  Thought Process:  Disorganized  Thought Content:  Delusions and Paranoid Ideation  Suicidal Thoughts:  No  Homicidal Thoughts:  No  Judgement:  Impaired  Insight:  Lacking  Psychomotor Activity:  Normal  Akathisia:  No  Fund of Knowledge:  Good    Assets:  Psychologist, Forensic  Cognition:  Impaired,  Moderate  ADL's:  Intact  AIMS (if indicated):       VITALS  Blood pressure (!) 143/89, pulse 81, temperature 98.4 F (36.9 C), resp. rate 18, height 5' 1 (1.549 m), weight 75.6 kg, SpO2 95%.  LABS  Admission on 06/10/2024  Component Date Value Ref Range Status   Sodium 06/10/2024 140  135 - 145 mmol/L Final   Potassium 06/10/2024 4.4  3.5 - 5.1 mmol/L Final   Chloride 06/10/2024 104  98 - 111 mmol/L Final   CO2 06/10/2024 27  22 - 32 mmol/L Final   Glucose, Bld 06/10/2024 107 (H)  70 - 99 mg/dL Final   Glucose reference range applies only to samples taken after fasting for at least 8 hours.   BUN 06/10/2024 20  8 - 23 mg/dL Final   Creatinine, Ser 06/10/2024 1.48 (H)  0.44 - 1.00 mg/dL Final   Calcium  06/10/2024 9.5  8.9 - 10.3 mg/dL Final   Total Protein 88/96/7974 6.6  6.5 - 8.1 g/dL Final   Albumin  06/10/2024 3.8  3.5 - 5.0 g/dL Final   AST 88/96/7974 19  15 - 41 U/L Final   ALT 06/10/2024 16  0 - 44 U/L Final   Alkaline Phosphatase 06/10/2024 95  38 - 126 U/L Final   Total Bilirubin 06/10/2024 1.0  0.0 - 1.2 mg/dL Final   GFR, Estimated 06/10/2024 35 (L)  >60 mL/min Final   Comment: (NOTE) Calculated using the CKD-EPI Creatinine Equation (2021)    Anion gap 06/10/2024 9  5 - 15 Final   Performed at [facility], [address], [place], [place] [address]   Alcohol, Ethyl (B) 06/10/2024 <15  <15 mg/dL Final   Comment: (NOTE) For medical purposes only. Performed at [facility], [address] [place], [place] [address]    Opiates 06/10/2024 NEGATIVE  NEGATIVE Final   Cocaine 06/10/2024 NEGATIVE  NEGATIVE Final   Benzodiazepines 06/10/2024 NEGATIVE  NEGATIVE Final   Amphetamines 06/10/2024 NEGATIVE  NEGATIVE Final   Tetrahydrocannabinol 06/10/2024 NEGATIVE  NEGATIVE Final   Barbiturates 06/10/2024 NEGATIVE  NEGATIVE Final   Methadone Scn, Ur 06/10/2024 NEGATIVE  NEGATIVE  Final   Fentanyl  06/10/2024 NEGATIVE  NEGATIVE Final  Comment: (NOTE) Drug screen is for Medical Purposes only. Positive results are preliminary only. If confirmation is needed, notify lab within 5 days.  Drug Class                 Cutoff (ng/mL) Amphetamine and metabolites 1000 Barbiturate and metabolites 200 Benzodiazepine              200 Opiates and metabolites     300 Cocaine and metabolites     300 THC                         50 Fentanyl                     5 Methadone                   300  Trazodone is metabolized in vivo to several metabolites,  including pharmacologically active m-CPP, which is excreted in the  urine.  Immunoassay screens for amphetamines and MDMA have potential  cross-reactivity with these compounds and may provide false positive  result.  Performed at [facility], [address] [place], [place] [address]    WBC 06/10/2024 8.3  4.0 - 10.5 K/uL Final   RBC 06/10/2024 4.63  3.87 - 5.11 MIL/uL Final   Hemoglobin 06/10/2024 12.9  12.0 - 15.0 g/dL Final   HCT 88/96/7974 41.0  36.0 - 46.0 % Final   MCV 06/10/2024 88.6  80.0 - 100.0 fL Final   MCH 06/10/2024 27.9  26.0 - 34.0 pg Final   MCHC 06/10/2024 31.5  30.0 - 36.0 g/dL Final   RDW 88/96/7974 13.2  11.5 - 15.5 % Final   Platelets 06/10/2024 211  150 - 400 K/uL Final   nRBC 06/10/2024 0.0  0.0 - 0.2 % Final   Neutrophils Relative % 06/10/2024 80  % Final   Neutro Abs 06/10/2024 6.6  1.7 - 7.7 K/uL Final   Lymphocytes Relative 06/10/2024 13  % Final   Lymphs Abs 06/10/2024 1.1  0.7 - 4.0 K/uL Final   Monocytes Relative 06/10/2024 6  % Final   Monocytes Absolute 06/10/2024 0.5  0.1 - 1.0 K/uL Final   Eosinophils Relative 06/10/2024 0  % Final   Eosinophils Absolute 06/10/2024 0.0  0.0 - 0.5 K/uL Final   Basophils Relative 06/10/2024 0  % Final   Basophils Absolute 06/10/2024 0.0  0.0 - 0.1 K/uL Final   Immature Granulocytes 06/10/2024 1  % Final   Abs Immature Granulocytes  06/10/2024 0.06  0.00 - 0.07 K/uL Final   Performed at [facility], [address] [place], [place] [address]   Ammonia 06/10/2024 23  9 - 35 umol/L Final   Performed at [facility], [address], [place], [place] [address]    PSYCHIATRIC REVIEW OF SYSTEMS (ROS)  ROS: Notable for the following relevant positive findings: Review of Systems  Psychiatric/Behavioral:  Positive for memory loss. The patient is nervous/anxious.     Additional findings:      Musculoskeletal: No abnormal movements observed      Gait & Station: Laying/Sitting      Pain Screening: Present - mild to moderate      Nutrition & Dental Concerns: If yes - consider referral to nutritional or dental specialist  RISK FORMULATION/ASSESSMENT  Is the patient experiencing any suicidal or homicidal ideations: No       Explain if yes:  Protective factors  considered for safety management: Supportive family  Risk factors/concerns considered for safety management:  Depression Physical illness/chronic pain Age over 17 Aggression Unmarried  Is there a safety management plan with the patient and treatment team to minimize risk factors and promote protective factors: Yes           Explain: Medication, possible placement Is crisis care placement or psychiatric hospitalization recommended: No     Based on my current evaluation and risk assessment, patient is determined at this time to be at:  Low risk  *RISK ASSESSMENT Risk assessment is a dynamic process; it is possible that this patient's condition, and risk level, may change. This should be re-evaluated and managed over time as appropriate. Please re-consult psychiatric consult services if additional assistance is needed in terms of risk assessment and management. If your team decides to discharge this patient, please advise the patient how to best access emergency psychiatric services, or to call 911, if their condition worsens or they  feel unsafe in any way.   Karna JINNY Pizza, NP Telepsychiatry Consult Services

## 2024-06-11 ENCOUNTER — Observation Stay (HOSPITAL_COMMUNITY)

## 2024-06-11 DIAGNOSIS — F22 Delusional disorders: Secondary | ICD-10-CM

## 2024-06-11 DIAGNOSIS — N3000 Acute cystitis without hematuria: Secondary | ICD-10-CM | POA: Diagnosis not present

## 2024-06-11 DIAGNOSIS — K5900 Constipation, unspecified: Secondary | ICD-10-CM | POA: Insufficient documentation

## 2024-06-11 DIAGNOSIS — G9341 Metabolic encephalopathy: Secondary | ICD-10-CM

## 2024-06-11 DIAGNOSIS — M898X9 Other specified disorders of bone, unspecified site: Secondary | ICD-10-CM | POA: Insufficient documentation

## 2024-06-11 LAB — MAGNESIUM: Magnesium: 2.2 mg/dL (ref 1.7–2.4)

## 2024-06-11 LAB — CBC
HCT: 39 % (ref 36.0–46.0)
Hemoglobin: 12.3 g/dL (ref 12.0–15.0)
MCH: 28.5 pg (ref 26.0–34.0)
MCHC: 31.5 g/dL (ref 30.0–36.0)
MCV: 90.5 fL (ref 80.0–100.0)
Platelets: 175 K/uL (ref 150–400)
RBC: 4.31 MIL/uL (ref 3.87–5.11)
RDW: 13.2 % (ref 11.5–15.5)
WBC: 6.3 K/uL (ref 4.0–10.5)
nRBC: 0 % (ref 0.0–0.2)

## 2024-06-11 LAB — TSH: TSH: 25.1 u[IU]/mL — ABNORMAL HIGH (ref 0.350–4.500)

## 2024-06-11 LAB — BASIC METABOLIC PANEL WITH GFR
Anion gap: 8 (ref 5–15)
BUN: 18 mg/dL (ref 8–23)
CO2: 29 mmol/L (ref 22–32)
Calcium: 9.3 mg/dL (ref 8.9–10.3)
Chloride: 104 mmol/L (ref 98–111)
Creatinine, Ser: 1.46 mg/dL — ABNORMAL HIGH (ref 0.44–1.00)
GFR, Estimated: 36 mL/min — ABNORMAL LOW (ref >60)
Glucose, Bld: 96 mg/dL (ref 70–99)
Potassium: 4.3 mmol/L (ref 3.5–5.1)
Sodium: 142 mmol/L (ref 135–145)

## 2024-06-11 LAB — FOLATE: Folate: 18.4 ng/mL (ref >5.9)

## 2024-06-11 LAB — VALPROIC ACID LEVEL: Valproic Acid Lvl: 15 ug/mL — ABNORMAL LOW (ref 50–100)

## 2024-06-11 LAB — VITAMIN B12: Vitamin B-12: 690 pg/mL (ref 180–914)

## 2024-06-11 LAB — T4, FREE: Free T4: 0.72 ng/dL (ref 0.61–1.12)

## 2024-06-11 MED ORDER — QUETIAPINE FUMARATE 25 MG PO TABS
25.0000 mg | ORAL_TABLET | Freq: Every day | ORAL | Status: DC
  Administered 2024-06-11 – 2024-06-13 (×3): 25 mg via ORAL
  Filled 2024-06-11 (×3): qty 1

## 2024-06-11 MED ORDER — GABAPENTIN 100 MG PO CAPS
200.0000 mg | ORAL_CAPSULE | Freq: Every day | ORAL | Status: DC
  Administered 2024-06-11 – 2024-06-14 (×4): 200 mg via ORAL
  Filled 2024-06-11 (×4): qty 2

## 2024-06-11 MED ORDER — ACETAMINOPHEN 325 MG PO TABS
650.0000 mg | ORAL_TABLET | Freq: Four times a day (QID) | ORAL | Status: DC | PRN
  Administered 2024-06-11 – 2024-06-13 (×3): 650 mg via ORAL
  Filled 2024-06-11 (×3): qty 2

## 2024-06-11 MED ORDER — ROSUVASTATIN CALCIUM 10 MG PO TABS
40.0000 mg | ORAL_TABLET | Freq: Every day | ORAL | Status: DC
  Administered 2024-06-11 – 2024-06-14 (×4): 40 mg via ORAL
  Filled 2024-06-11 (×4): qty 4

## 2024-06-11 MED ORDER — CLOPIDOGREL BISULFATE 75 MG PO TABS
75.0000 mg | ORAL_TABLET | Freq: Every day | ORAL | Status: DC
  Administered 2024-06-11 – 2024-06-14 (×4): 75 mg via ORAL
  Filled 2024-06-11 (×4): qty 1

## 2024-06-11 MED ORDER — TECHNETIUM TC 99M MEDRONATE IV KIT
20.0000 | PACK | Freq: Once | INTRAVENOUS | Status: AC | PRN
  Administered 2024-06-11: 22 via INTRAVENOUS

## 2024-06-11 MED ORDER — ACETAMINOPHEN 650 MG RE SUPP: 650.0000 mg | Freq: Four times a day (QID) | RECTAL | Status: DC | PRN

## 2024-06-11 MED ORDER — LEVOTHYROXINE SODIUM 100 MCG PO TABS
200.0000 ug | ORAL_TABLET | Freq: Every day | ORAL | Status: DC
  Administered 2024-06-12: 200 ug via ORAL
  Filled 2024-06-11: qty 2

## 2024-06-11 MED ORDER — POLYETHYLENE GLYCOL 3350 17 G PO PACK: 17.0000 g | PACK | Freq: Every day | ORAL | Status: DC | PRN

## 2024-06-11 MED ORDER — OXYCODONE HCL 5 MG PO TABS
5.0000 mg | ORAL_TABLET | Freq: Four times a day (QID) | ORAL | Status: DC | PRN
  Administered 2024-06-11 – 2024-06-14 (×7): 5 mg via ORAL
  Filled 2024-06-11 (×8): qty 1

## 2024-06-11 MED ORDER — LATANOPROST 0.005 % OP SOLN
1.0000 [drp] | Freq: Every day | OPHTHALMIC | Status: DC
  Administered 2024-06-11 – 2024-06-13 (×3): 1 [drp] via OPHTHALMIC
  Filled 2024-06-11: qty 2.5

## 2024-06-11 MED ORDER — SENNOSIDES-DOCUSATE SODIUM 8.6-50 MG PO TABS
1.0000 | ORAL_TABLET | Freq: Two times a day (BID) | ORAL | Status: DC
  Administered 2024-06-11 – 2024-06-14 (×7): 1 via ORAL
  Filled 2024-06-11 (×7): qty 1

## 2024-06-11 MED ORDER — SODIUM CHLORIDE 0.9 % IV SOLN
1.0000 g | INTRAVENOUS | Status: DC
  Administered 2024-06-11 – 2024-06-13 (×3): 1 g via INTRAVENOUS
  Filled 2024-06-11 (×3): qty 10

## 2024-06-11 MED ORDER — ONDANSETRON HCL 4 MG PO TABS: 4.0000 mg | ORAL_TABLET | Freq: Four times a day (QID) | ORAL | Status: DC | PRN

## 2024-06-11 MED ORDER — QUETIAPINE FUMARATE 25 MG PO TABS: 25.0000 mg | ORAL_TABLET | ORAL | Status: DC

## 2024-06-11 MED ORDER — HALOPERIDOL LACTATE 5 MG/ML IJ SOLN
2.0000 mg | Freq: Four times a day (QID) | INTRAMUSCULAR | Status: DC | PRN
  Administered 2024-06-12: 2 mg via INTRAVENOUS
  Filled 2024-06-11: qty 1

## 2024-06-11 MED ORDER — ONDANSETRON HCL 4 MG/2ML IJ SOLN: 4.0000 mg | Freq: Four times a day (QID) | INTRAMUSCULAR | Status: DC | PRN

## 2024-06-11 MED ORDER — HEPARIN SODIUM (PORCINE) 5000 UNIT/ML IJ SOLN
5000.0000 [IU] | Freq: Three times a day (TID) | INTRAMUSCULAR | Status: DC
  Administered 2024-06-11 – 2024-06-14 (×10): 5000 [IU] via SUBCUTANEOUS
  Filled 2024-06-11 (×10): qty 1

## 2024-06-11 NOTE — H&P (Signed)
 History and Physical    ELISEA KHADER FMW:[id number] DOB: 1940-12-16 DOA: 06/10/2024  DOS: the patient was seen and examined on 06/10/2024  PCP: Caro [name] POUR, NP   Patient coming from: Home  I have personally briefly reviewed patient's old medical records in [facility] and CareEverywhere  HPI:   ADELIN VENTRELLA is a 83 y.o. year old female with past medical history of  stage 3b CKD, osteoarthritis, TBI, GERD, OSA, hypothyroidism, prior CVA, paroxysmal atrial fibrillation, CHF, HTN, MI, hyperlipidemia, CABG, carotid artery stenosis, PAD, IBS, anxiety, depression, and dementia.  She presents to [facility] ED accompanied by her daughter with concerns of increased confusion and paranoia since discharge from [facility] ED in September where she was held 9/16 through 9/22 and seen by psychiatry. Per chart review she was being treated for psychosis at that time.  Since discharge family has been trying to pursue outpatient placement however they have been unsuccessful.  In the ED today patient reports that her daughter is an impostor and she is afraid people are trying to harm her. Daughter has noted that patient had a stroke at the end of May and since that time patient has had worsening behavior to include increased paranoia, psychosis, grabbing at her daughter, and overall more hostile. Daughter reports patient has about a 30 second memory span, has been calling the daughter the name of one of the patient's sisters who died 30 years ago, and that every day between 1 and 3 PM the patient experiences intense fear to the point of physically shaking.  Daughter says she spends much of the remainder of the evening trying to calm her mother down and though she improves she ultimately does not return to baseline until the next morning when she wakes up.   ED Course: On arrival to [facility] ED patient was noted to be afebrile temp 36.9 C, BP 123/89, HR 81, RR 18, SpO2 95% on room air.  Labs  notable for negative ammonia, ethanol, and urine drug screen.  Urinalysis with moderate leukocytes, >50 WBCs, and rare bacteria.  Urine culture processing.  CT pelvis showing mod-marked cystitis, large amount of retained stool in distal sigmoid colon and rectum, and numerous lytic areas scattered throughout the pelvis and bilateral femurs. She was given fosfomycin, Seroquel , and Tylenol .  [facility] consulted by EDP who recommended increasing Seroquel  to 25 mg daily at 4 PM and at bedtime and they did not feel the patient met criteria for inpatient psychiatric admission. [facility] contacted for admission.  Review of Systems:   Review of Systems  Constitutional:  Negative for chills and fever.  Respiratory:  Negative for cough and shortness of breath.   Cardiovascular:  Negative for chest pain.  Gastrointestinal:  Negative for abdominal pain, nausea and vomiting.  Genitourinary:  Positive for dysuria. Negative for flank pain, frequency and urgency.  Musculoskeletal:  Positive for joint pain. Negative for falls.  Neurological:  Negative for focal weakness and weakness.  Psychiatric/Behavioral:  The patient is nervous/anxious.     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   CKD (chronic kidney disease), stage IV (HCC)    Cognitive impairment    Colon  polyps    Community acquired pneumonia    Depression    Diverticulosis    Dysuria    Encephalomalacia    GAD (generalized anxiety disorder)    GERD (gastroesophageal reflux disease)    Glaucoma    Hearing loss    Hemorrhoids    Hypertension    Hypothyroidism    Incontinence    Mixed hyperlipidemia    Myocardial infarction [facility])     NSTEMI (non-ST elevated myocardial infarction) (HCC) 12/19/2011   OSA on CPAP    PAF (paroxysmal atrial fibrillation) (HCC)    Peripheral arterial disease    Pneumonitis 04/26/2015   Pyelonephritis due to Escherichia coli 04/28/2015   Sepsis (HCC)    Spondylosis of lumbar region without myelopathy or radiculopathy    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HCC)    Urticaria    Vertigo, benign positional     Past Surgical History:  Procedure Laterality Date   ABDOMINAL AORTOGRAM W/LOWER EXTREMITY N/A 08/22/2017   Procedure: ABDOMINAL AORTOGRAM W/LOWER EXTREMITY;  Surgeon: Serene Gaile ORN, MD;  Location: [facility] INVASIVE CV LAB;  Service: Cardiovascular;  Laterality: N/A;   BILATERAL UPPER EXTREMITY ANGIOGRAM N/A 09/11/2012   Procedure: BILATERAL UPPER EXTREMITY ANGIOGRAM;  Surgeon: Gaile ORN Serene, MD;  Location: [facility] CATH LAB;  Service: Cardiovascular;  Laterality: N/A;   BIOPSY  03/25/2020   Procedure: BIOPSY;  Surgeon: Wilhelmenia Aloha Raddle., MD;  Location: [facility] ENDOSCOPY;  Service: Gastroenterology;;   carotid duplex doppler Bilateral 09/03/2012, 11/03/2011   Evidence of 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 patent left common carotid- subclavian artery bypass with turbulent flow noted at the anastomosis with velocities of 295 cm/s   CAROTID-SUBCLAVIAN BYPASS GRAFT  12/15/2011   Procedure: BYPASS GRAFT CAROTID-SUBCLAVIAN;  Surgeon: Gaile ORN Serene, MD;  Location: [facility] OR;  Service: Vascular;  Laterality: Left;  Left Carotid subclavian bypass   CORONARY ANGIOPLASTY     CORONARY ARTERY BYPASS GRAFT     CORONARY ATHERECTOMY N/A 03/19/2020   Procedure: CORONARY ATHERECTOMY;  Surgeon: Dann Candyce RAMAN, MD;  Location: [facility] INVASIVE CV LAB;  Service: Cardiovascular;  Laterality: N/A;   CORONARY STENT INTERVENTION N/A 03/19/2020   Procedure: CORONARY STENT INTERVENTION;   Surgeon: Dann Candyce RAMAN, MD;  Location: [facility] INVASIVE CV LAB;  Service: Cardiovascular;  Laterality: N/A;   DOPPLER ECHOCARDIOGRAPHY  05/27/2010, 09/17/2008   Mild Proximal septal thickening is noted. Left ventricular systolic functions is normal ejection fraction =>55%. the aortic valve appears to be mildly sclerotic    ENDARTERECTOMY Right 12/13/2023   Procedure: ENDARTERECTOMY, CAROTID;  Surgeon: Serene Gaile ORN, MD;  Location: [facility] OR;  Service: Vascular;  Laterality: Right;   ESOPHAGOGASTRODUODENOSCOPY (EGD) WITH PROPOFOL  N/A 03/25/2020   Procedure: ESOPHAGOGASTRODUODENOSCOPY (EGD) WITH PROPOFOL ;  Surgeon: Wilhelmenia Aloha Raddle., MD;  Location: [facility] ENDOSCOPY;  Service: Gastroenterology;  Laterality: N/A;   fem-fem bypass graft  1999   holter monitor  01/21/2008   The predominant rhythm was normal sinus rhythm. Minimum heartrate of 63 bpm at +01:00, maximum heartrate of 105 bpm at + 10:35; and the average heartrate of 75 bpm. Ventricular ectopic activity totaled 1270: Multifocal; 866-PVC's and 404-VEs              JOINT REPLACEMENT     Left knee   LEFT HEART CATH AND CORS/GRAFTS ANGIOGRAPHY N/A 03/19/2020   Procedure: LEFT HEART CATH AND CORS/GRAFTS ANGIOGRAPHY;  Surgeon: Dann Candyce RAMAN, MD;  Location: [facility] INVASIVE CV LAB;  Service: Cardiovascular;  Laterality: N/A;   LEFT HEART CATHETERIZATION WITH CORONARY/GRAFT ANGIOGRAM N/A 12/21/2011   Procedure: LEFT HEART CATHETERIZATION WITH EL BILE;  Surgeon: Alm LELON Clay, MD;  Location: [facility] CATH LAB;  Service: Cardiovascular;  Laterality: N/A;   NM MYOCAR PERF EJECTION FRACTION  09/22/2009, 07/03/2007   the post stress myocardial perfusion images show a normal pattern of perfusion is all regions. The post-stress ejection fraction is 68 %. no significant wall motion abnormalities noted. This is a low risk scan.   PATCH ANGIOPLASTY Right 12/13/2023   Procedure: ANGIOPLASTY, USING PATCH GRAFT;  Surgeon: Serene Gaile LELON, MD;  Location:  [facility] OR;  Service: Vascular;  Laterality: Right;   PERIPHERAL VASCULAR INTERVENTION  08/22/2017   Procedure: PERIPHERAL VASCULAR INTERVENTION;  Surgeon: Serene Gaile LELON, MD;  Location: [facility] INVASIVE CV LAB;  Service: Cardiovascular;;  Fem/Fem Graft   REPLACEMENT TOTAL KNEE  05-2011   UNILATERAL UPPER EXTREMEITY ANGIOGRAM Left 11/15/2011   Procedure: UNILATERAL UPPER VENITA BILE;  Surgeon: Dorn JINNY Lesches, MD;  Location: [facility] CATH LAB;  Service: Cardiovascular;  Laterality: Left;     reports that she quit smoking about 42 years ago. Her smoking use included cigarettes. She started smoking about 45 years ago. She has never used smokeless tobacco. She reports that she does not currently use alcohol. She reports that she does not use drugs.  Allergies  Allergen Reactions   Lidocaine  Swelling and Other (See Comments)    Use CORRECT dose for age/weight- red welts and tongue swelling result, if not   Cymbalta  [Duloxetine  Hcl] Other (See Comments)    Increased confusion and memory concerns    Donepezil Other (See Comments)    Altered mood and Aggression     Mobic [Meloxicam] Other (See Comments)    Heart failure- cannot take   Namenda [Memantine] Other (See Comments)    Severe aggression   Nsaids Other (See Comments)    Cannot have due to heart issues   Other Other (See Comments)    No salt or anything that might cause fluid retention/swelling!!   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, however(??)   Vicodin [Hydrocodone-Acetaminophen ] Other (See Comments)    Delirium, Confusion, and Toxic dementia    Family History  Problem Relation Age of Onset   Heart attack Mother    Heart disease Mother        before age 29   Diabetes Father    Heart disease Father    Hypertension Father    Hyperlipidemia Father    Heart attack Father 69   Heart attack Brother 12   Cerebral palsy Sister 28   Congestive Heart Failure Sister  2   Heart attack Sister 2   Hypertension Sister 32   Dementia Sister    Anxiety disorder Sister    Anxiety disorder Sister 53   Heart Problems Sister    Stroke Sister    Heart Problems Sister 27   Heart attack Sister 29   Colon cancer Brother 43   Prostate cancer Brother 70   Hypertension Daughter    Irritable bowel syndrome Daughter    Depression Daughter    Anxiety disorder Daughter     Prior to Admission medications   Medication Sig Start Date End Date Taking? Authorizing Provider  albuterol  (VENTOLIN  HFA) 108 (90 Base) MCG/ACT inhaler Inhale 2 puffs into the lungs every 6 (six) hours as  needed for wheezing or shortness of breath. 09/12/23  Yes Sherlynn Madden, MD  aspirin  EC 81 MG tablet Take 81 mg by mouth at bedtime.   Yes [provider]  busPIRone  (BUSPAR ) 15 MG tablet Take 1 tablet (15 mg total) by mouth 2 (two) times daily. 11/16/22 08/08/48 Yes Franchot [name] SQUIBB, PMHNP  clopidogrel  (PLAVIX ) 75 MG tablet Take 75 mg by mouth daily.   Yes [provider]  divalproex  (DEPAKOTE  ER) 250 MG 24 hr tablet Take 1 tablet (250 mg total) by mouth at bedtime. 05/01/24  Yes Medina-Vargas, Monina C, NP  dorzolamide  (TRUSOPT ) 2 % ophthalmic solution Place 1 drop into both eyes in the morning and at bedtime.   Yes [provider]  escitalopram  (LEXAPRO ) 20 MG tablet Take 1 tablet (20 mg total) by mouth daily. Patient taking differently: Take 20 mg by mouth daily at 12 noon. 05/01/24  Yes Medina-Vargas, Monina C, NP  ezetimibe  (ZETIA ) 10 MG tablet TAKE 1 TABLET(10 MG) BY MOUTH DAILY Patient taking differently: Take 10 mg by mouth in the morning. 03/14/24  Yes Caro [name] POUR, NP  furosemide  (LASIX ) 40 MG tablet Take 1 tablet (40 mg total) by mouth daily as needed for fluid or edema. 12/14/23  Yes Rhyne, Samantha J, PA-C  gabapentin  (NEURONTIN ) 100 MG capsule Take 200 mg by mouth daily at 12 noon.   Yes [provider]  latanoprost  (XALATAN ) 0.005 %  ophthalmic solution Place 1 drop into both eyes at bedtime.   Yes [provider]  levothyroxine  (SYNTHROID ) 150 MCG tablet Take 1 tablet (150 mcg total) by mouth daily at 6 (six) AM. 05/01/24  Yes Medina-Vargas, Monina C, NP  meclizine  (ANTIVERT ) 25 MG tablet Take 25 mg by mouth at bedtime.   Yes [provider]  metoprolol  succinate (TOPROL -XL) 25 MG 24 hr tablet Take 1 tablet (25 mg total) by mouth daily. Patient taking differently: Take 25 mg by mouth at bedtime. 11/29/23  Yes Caro [name] POUR, NP  nitroGLYCERIN  (NITROSTAT ) 0.4 MG SL tablet Place 0.4 mg under the tongue every 5 (five) minutes as needed for chest pain.   Yes [provider]  pantoprazole  (PROTONIX ) 40 MG tablet Take 1 tablet (40 mg total) by mouth daily. Patient taking differently: Take 40 mg by mouth daily before breakfast. 09/12/23  Yes Sherlynn Madden, MD  QUEtiapine  (SEROQUEL ) 25 MG tablet Take 1 tablet (25 mg total) by mouth at bedtime. Would wean as tolerated 05/01/24  Yes Medina-Vargas, Monina C, NP  rosuvastatin  (CRESTOR ) 40 MG tablet Take 40 mg by mouth daily.   Yes [provider]  TYLENOL  500 MG tablet Take 500-750 mg by mouth every 8 (eight) hours as needed for mild pain (pain score 1-3) (or headaches).   Yes [provider]  ALPRAZolam  (XANAX ) 0.5 MG tablet Take 1 tablet (0.5 mg total) by mouth daily as needed for up to 2 doses for anxiety. Patient not taking: Reported on 06/10/2024 12/24/23   Perri DELENA Meliton Mickey., MD    Physical Exam: Vitals:   06/10/24 1713 06/10/24 1735 06/10/24 1802 06/10/24 2145  BP:  (!) 143/89 (!) 143/89 (!) 147/65  Pulse:  81 81 74  Resp:  18  18  Temp:  98.4 F (36.9 C)  98.2 F (36.8 C)  TempSrc:    Oral  SpO2:  95%  94%  Weight: 75.6 kg     Height: 5' 1 (1.549 m)       Physical Exam Vitals and nursing  note reviewed.  Constitutional:      General: She is not in acute distress. HENT:     Head: Normocephalic.  Eyes:      Pupils: Pupils are equal, round, and reactive to light.  Cardiovascular:     Rate and Rhythm: Normal rate and regular rhythm.     Pulses: Normal pulses.     Heart sounds: No murmur heard.    No friction rub. No gallop.  Pulmonary:     Effort: Pulmonary effort is normal. No respiratory distress.     Breath sounds: Normal breath sounds. No wheezing, rhonchi or rales.  Abdominal:     General: Abdomen is flat. Bowel sounds are normal. There is no distension.     Palpations: Abdomen is soft.     Tenderness: There is no abdominal tenderness.  Skin:    General: Skin is warm and dry.     Capillary Refill: Capillary refill takes less than 2 seconds.  Neurological:     General: No focal deficit present.     Mental Status: She is confused.      Labs on Admission: I have personally reviewed following labs and imaging studies  CBC: Recent Labs  Lab 06/10/24 1748  WBC 8.3  NEUTROABS 6.6  HGB 12.9  HCT 41.0  MCV 88.6  PLT 211   Basic Metabolic Panel: Recent Labs  Lab 06/10/24 1748  NA 140  K 4.4  CL 104  CO2 27  GLUCOSE 107*  BUN 20  CREATININE 1.48*  CALCIUM  9.5   GFR: Estimated Creatinine Clearance: 27.3 mL/min (A) (by C-G formula based on SCr of 1.48 mg/dL (H)). Liver Function Tests: Recent Labs  Lab 06/10/24 1748  AST 19  ALT 16  ALKPHOS 95  BILITOT 1.0  PROT 6.6  ALBUMIN  3.8   No results for input(s): LIPASE, AMYLASE in the last 168 hours. Recent Labs  Lab 06/10/24 1747  AMMONIA 23   Coagulation Profile: No results for input(s): INR, PROTIME in the last 168 hours. Cardiac Enzymes: No results for input(s): CKTOTAL, CKMB, CKMBINDEX, TROPONINI, TROPONINIHS in the last 168 hours. BNP (last 3 results) Recent Labs    12/16/23 0648  BNP 1,006.4*   HbA1C: No results for input(s): HGBA1C in the last 72 hours. CBG: No results for input(s): GLUCAP in the last 168 hours. Lipid Profile: No results for input(s): CHOL, HDL, LDLCALC,  TRIG, CHOLHDL, LDLDIRECT in the last 72 hours. Thyroid  Function Tests: No results for input(s): TSH, T4TOTAL, FREET4, T3FREE, THYROIDAB in the last 72 hours. Anemia Panel: No results for input(s): VITAMINB12, FOLATE, FERRITIN, TIBC, IRON, RETICCTPCT in the last 72 hours. Urine analysis:    Component Value Date/Time   COLORURINE STRAW (A) 06/10/2024 1751   APPEARANCEUR HAZY (A) 06/10/2024 1751   LABSPEC 1.006 06/10/2024 1751   PHURINE 7.0 06/10/2024 1751   GLUCOSEU NEGATIVE 06/10/2024 1751   HGBUR SMALL (A) 06/10/2024 1751   BILIRUBINUR NEGATIVE 06/10/2024 1751   BILIRUBINUR Negative 05/01/2024 1443   KETONESUR NEGATIVE 06/10/2024 1751   PROTEINUR NEGATIVE 06/10/2024 1751   UROBILINOGEN negative 05/01/2024 1443   UROBILINOGEN 0.2 04/26/2015 0811   NITRITE NEGATIVE 06/10/2024 1751   LEUKOCYTESUR MODERATE (A) 06/10/2024 1751    Radiological Exams on Admission: I have personally reviewed images CT PELVIS WO CONTRAST Result Date: 06/10/2024 CLINICAL DATA:  Pelvis pain. EXAM: CT PELVIS WITHOUT CONTRAST TECHNIQUE: Multidetector CT imaging of the pelvis was performed following the standard protocol without intravenous contrast. RADIATION DOSE REDUCTION: This exam was performed  according to the departmental dose-optimization program which includes automated exposure control, adjustment of the mA and/or kV according to patient size and/or use of iterative reconstruction technique. COMPARISON:  March 21, 2020 FINDINGS: Urinary Tract: There is moderate to marked severity diffuse urinary bladder wall thickening. A mild amount of surrounding inflammatory fat stranding is seen. Bowel: There is no evidence of bowel dilatation. Noninflamed diverticula are noted throughout the sigmoid colon. A large amount of retained stool is seen within the distal sigmoid colon and rectum. Vascular/Lymphatic: There is evidence of marked severity atherosclerotic disease involving the visualized  portion of the abdominal aorta and bilateral common iliac arteries. Evidence of prior fem-fem bypass graft is noted. Reproductive: A small, stable parenchymal calcification is noted within the uterus on the left. The bilateral adnexa are unremarkable. Other: A 4.4 cm cystic appearing structure (approximately 34.91 Hounsfield units) is seen within the mid to lower left abdomen. Musculoskeletal: There is a chronic fracture deformity of the left inferior pubic ramus. No acute osseous abnormalities are identified. Numerous subcentimeter lytic areas are seen scattered throughout the lower pelvis and bilateral femurs. This is seen on the prior study and is more prominently noted on the current exam. Marked severity degenerative changes are noted within the bilateral hips and visualized portion of the lower lumbar spine. IMPRESSION: 1. Findings consistent with moderate to marked severity cystitis. Correlation with urinalysis is recommended. 2. Sigmoid diverticulosis. 3. Large amount of retained stool within the distal sigmoid colon and rectum. 4. Numerous subcentimeter lytic areas scattered throughout the pelvis and bilateral femurs, as described above. While this may represent sequelae associated with multiple myeloma, correlation with a nuclear medicine bone scan is recommended to exclude the presence of an underlying neoplastic process. 5. Marked severity degenerative changes within the bilateral hips and visualized portion of the lower lumbar spine. 6. Aortic atherosclerosis. Electronically Signed   By: Suzen Dials M.D.   On: 06/10/2024 23:01   CT Head Wo Contrast Result Date: 06/10/2024 CLINICAL DATA:  Ataxia. EXAM: CT HEAD WITHOUT CONTRAST TECHNIQUE: Contiguous axial images were obtained from the base of the skull through the vertex without intravenous contrast. RADIATION DOSE REDUCTION: This exam was performed according to the departmental dose-optimization program which includes automated exposure control,  adjustment of the mA and/or kV according to patient size and/or use of iterative reconstruction technique. COMPARISON:  March 29, 2024 FINDINGS: Brain: There is generalized cerebral atrophy with widening of the extra-axial spaces and ventricular dilatation. There are areas of decreased attenuation within the white matter tracts of the supratentorial brain, consistent with microvascular disease changes. 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 Vascular: No hyperdense vessel or unexpected calcification. Skull: Normal. Negative for fracture or focal lesion. Sinuses/Orbits: No acute finding. Other: None. IMPRESSION: 1. Generalized cerebral atrophy and microvascular disease changes of the supratentorial brain. 2. 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 3. No acute intracranial abnormality. Electronically Signed   By: Suzen Dials M.D.   On: 06/10/2024 20:41   DG Hip Unilat With Pelvis 2-3 Views Left Result Date: 06/10/2024 CLINICAL DATA:  pain EXAM: DG HIP (WITH OR WITHOUT PELVIS) 2-3V LEFT COMPARISON:  None Available. FINDINGS: Osteopenia.No evidence of pelvic fracture or diastasis.No acute hip fracture or dislocation.Severe bilateral hip joint space loss with bony sclerosis and osteophyte formation. Multilevel degenerative disc disease of the lumbar spine. Soft tissues are unremarkable. IMPRESSION: 1. No acute, displaced fracture or dislocation was visualized. However, underlying osteopenia decreases the sensitivity for nondisplaced fracture  using plain radiography. If the patient is unable to bear weight, or there is high clinical suspicion, a follow-up CT should be considered for further evaluation. 2. Severe bilateral hip joint osteoarthritis Electronically Signed   By: Rogelia Myers M.D.   On: 06/10/2024 19:06    EKG: My personal interpretation of EKG shows: NSR, HR 78 with (L) ventricular hypertrophy   Assessment/Plan Principal Problem:   Acute  cystitis   ##Acute Cystitis Patient reporting soreness with urination and mod-marked cystitis noted on CT pelvis - Cover with IV Rocephin  - Follow Urine culture   ## (?) Acute metabolic Encephalopathy With chronicity of symptoms, do not believe this is acute metabolic encephalopathy but more likely progression of dementia with behavioral and perceptual disturbances - Treat as above - Check B12, Folate, and TSH - Delirium and falls precautions  ##Constipation - Scheduled Senokot BID - PRN Miralax   ##Lytic Lesions - NM Bone Scan  #Dementia - [facility] has recommended increasing Seroquel  to 25 mg daily at 4PM and qHS  #Hypertension - Continue home metoprolol   #Chronic combined systolic and diastolic heart failure - Continue home metoprolol   #Paroxysmal Atrial Fibrillation - Continue home metoprolol  - Not anticoagulated  #Hx of CVA #CAD - Continue home aspirin  and plavix   #Hyperlipidemia - Continue home Zetia  and Rosuvastatin   #Chronic Kidney Disease, Stage 3a Stable -Avoid nephrotoxins, contrast Dyes, Hypotension and Dehydration  - Repeat metabolic panel with a.m. labs  #Hypothyroidism - Continue home Levothyroxine   #GERD - Continue home protonix   #Depression and Anxiety #Mood Disorder - Continue home Buspar , Depakote , and Lexapro   VTE prophylaxis:  SQ Heparin   GI prophylaxis: Protonix   Diet: Heart Healthy Access: PIV Lines: None Code Status:  Full Code HCDM: Daughter, Bruno Telemetry: No  Admission status: Observation, Med-Surg Patient is from: Home  Anticipated d/c is to: Home  Anticipated d/c date is: 1-2 days Patient currently: Receiving IV antibiotics   Family Communication:  Daughter [name] and called and updated at [phone]  Consults called: Psychiatry called by EDP    Severity of Illness: The appropriate patient status for this patient is OBSERVATION. Observation status is judged to be reasonable and necessary in  order to provide the required intensity of service to ensure the patient's safety. The patient's presenting symptoms, physical exam findings, and initial radiographic and laboratory data in the context of their medical condition is felt to place them at decreased risk for further clinical deterioration. Furthermore, it is anticipated that the patient will be medically stable for discharge from the hospital within 2 midnights of admission.   To reach the provider On-Call:   7AM- 7PM see care teams to locate the attending and reach out to them via [email or web address] Password: [email or web address] 7PM-7AM contact night-coverage If you still have difficulty reaching the appropriate provider, please page the [facility] (Director on Call) for [facility] on amion for assistance  This document was prepared using Conservation officer, historic buildings and may include unintentional dictation errors.  Rockie Rams FNP-BC, PMHNP-BC Nurse Practitioner [facility]

## 2024-06-11 NOTE — Consult Note (Signed)
 [facility] Psychiatric Consult Initial  Patient Name: .Deanna [name]  MRN: [id number]  DOB: 02/05/1941  Consult Order details:  Orders (From admission, onward)     Start     Ordered   06/11/24 1203  IP CONSULT TO PSYCHIATRY       Ordering Provider: Jillian Buttery, MD  Provider:  (Not yet assigned)  Question Answer Comment  Location [facility]   Reason for Consult? Extreme anxiety, paranoia, hallucination      06/11/24 1202   06/10/24 1729  CONSULT TO CALL ACT TEAM       Ordering Provider: Mannie Fairy DASEN, DO  Provider:  (Not yet assigned)  Question:  Reason for Consult?  Answer:  Psych consult   06/10/24 1728             Mode of Visit: In person    Psychiatry Consult Evaluation  Service Date: June 11, 2024 LOS:  LOS: 0 days  Chief Complaint I'm doing well.  Primary Psychiatric Diagnoses  Dementia with behavioral disturbance  Assessment  CECIA EGGE is a 83 y.o. female admitted: Medicallyfor 06/10/2024  5:17 PM for per Rockie Rams, NP, with history of CKD stage IIIb, osteoarthritis, TBI, GERD, OSA, hypothyroidism, prior CVA, paroxysmal A-fib, CHF, hypertension, hyper lipidemia, coronary artery disease, CABG, carotid artery stenosis, anxiety, depression, dementia who was brought from home by the daughter for evaluation of increased confusion, paranoia.  She was recently here at [facility], ED for the same and was seen by psychiatry and was discharged home.  She was treated for psychosis at that time.  Family were trying to pursue outpatient placement but unsuccessful.  She has been noticed to have increased paranoia, psychosis, hostile at home.  On prednisone , she was hemodynamically stable.  UDS was negative.  UA showed moderate leukocytes, rare bacteria.  UTI suspected.  CT abdomen/pelvis showed cystitis, constipation, numerous lytic areas scattered throughout the pelvis and bilateral femur.  Psychiatry consulted.  Started on antibiotic for  possible UTI.  Her current presentation of poor memory, behavior changes, forgetfulness is most consistent with dementia with behavioral changes. Current outpatient psychotropic medications include Xanax , Buspar , Depakote , Lexapro , and Seroquel  and historically she has had a positive response to these medications.  On initial examination, patient was calmly sitting in her recliner, eating graham crackers, and listening to the tv. Please see plan below for detailed recommendations.   Diagnoses:  Active Hospital problems: Principal Problem:   Acute cystitis    Plan   ## Psychiatric Medication Recommendations:  Continue Seroquel  25 mg BID with reduction at discharge to night time dose only and am dose PRN Continue Buspar  15 mg BID  Continue Xanax  0.5 mg PRN daily anxieyt  ## Medical Decision Making Capacity: Not specifically addressed in this encounter  ## Further Work-up:  -- most recent EKG on 06/11/2024 had QtC of 470 -- Pertinent labwork reviewed earlier this admission includes: CBC and diff, chem panel, valproic acid , TSH 25.100, T4 free 0.72, U/A, and EKG  ## Disposition:-- There are no psychiatric contraindications to discharge at this time  ## Behavioral / Environmental: -Delirium Precautions: Delirium Interventions for Nursing and Staff: - RN to open blinds every AM. - To Bedside: Glasses, hearing aide, and pt's own shoes. Make available to patients. when possible and encourage use. - Encourage po fluids when appropriate, keep fluids within reach. - OOB to chair with meals. - Passive ROM exercises to all extremities with AM & PM care. - RN to  assess orientation to person, time and place QAM and PRN. - Recommend extended visitation hours with familiar family/friends as feasible. - Staff to minimize disturbances at night. Turn off television when pt asleep or when not in use.   ## Safety and Observation Level:  - Based on my clinical evaluation, I estimate the patient to be at minimal  risk of self harm in the current setting. - At this time, we recommend  routine. This decision is based on my review of the chart including patient's history and current presentation, interview of the patient, mental status examination, and consideration of suicide risk including evaluating suicidal ideation, plan, intent, suicidal or self-harm behaviors, risk factors, and protective factors. This judgment is based on our ability to directly address suicide risk, implement suicide prevention strategies, and develop a safety plan while the patient is in the clinical setting. Please contact our team if there is a concern that risk level has changed.  CSSR Risk Category:   Suicide Risk Assessment: Patient has following modifiable risk factors for suicide: none Patient has following non-modifiable or demographic risk factors for suicide: none Patient has the following protective factors against suicide: Supportive family, Pets in the home, and Frustration tolerance  Thank you for this consult request. Recommendations have been communicated to the primary team.  We will sign off at this time.   Sharlot Becker, NP       History of Present Illness  Relevant Aspects of [facility] Course:  Admitted on 06/10/2024 per Rockie Rams, NP, with history of CKD stage IIIb, osteoarthritis, TBI, GERD, OSA, hypothyroidism, prior CVA, paroxysmal A-fib, CHF, hypertension, hyper lipidemia, coronary artery disease, CABG, carotid artery stenosis, anxiety, depression, dementia who was brought from home by the daughter for evaluation of increased confusion, paranoia.  She was recently here at [facility], ED for the same and was seen by psychiatry and was discharged home.  She was treated for psychosis at that time.  Family were trying to pursue outpatient placement but unsuccessful.  She has been noticed to have increased paranoia, psychosis, hostile at home.  On prednisone , she was hemodynamically stable.  UDS was  negative.  UA showed moderate leukocytes, rare bacteria.  UTI suspected.  CT abdomen/pelvis showed cystitis, constipation, numerous lytic areas scattered throughout the pelvis and bilateral femur.  Psychiatry consulted.  Started on antibiotic for possible UTI.  Per Karna Pizza, NP, via tele-psych in the ED via [facility]: Per chart, daughter brought pt in due to concern regarding her mental status.  Reported she reported increased confusion and paranoia.  Pt had a recent inpatient psychiatric admission for similar issues and family has attempted to find alternative placement with no success up to this point.    Upon evaluation, pt reported somebody has been after me.  Pt was able to identify that she was in the hospital and that the year is 2025 but became anxious when asked about the month and president.  Reported the woman who claims to be her daughter is an imposter stating I don't know her.  Reported they said they would kill me and when asked who, she stated that is what the person pretending to be her daughter told her.  Pt continued to express fear of someone getting in to the hospital to hurt her.  Pt does have a hx of dementia with behavioral disturbance and daughter apparently reported her sundowning is getting much worse.    At this time, would r/o any organic cause for AMS before  referring to inpatient psychiatric facility but would increase Seroquel  to 25 mg po BID (4pm and HS) at this time to help with paranoia, delusions and agitation.  Recommend Valproic Acid  Level.  If needed, we can reassess tomorrow to determine if a higher level of psychiatric care is required.        Patient Report:  On assessment, the patient was sitting in her recliner, eating graham crackers, and listening to the tv.  She stated, I'm doing well.  When asked about her depression she said, I can't recall it, and anxiety of I guess I do.  She slept well.  Appetite is good.  Denies hallucinations and  reported, everything's been good.  She was able to tell this provider that she lives in a condo with her daughter, Bruno, and her cat. Her RN reported no hallucinations but having some paranoia, likes the door to be open as It makes me nervous to have it closed.  She was seen in the ED by psych with an increase in her Seroquel  for her hallucinations, most likely linked to her UTI that is being treated.  Psych ROS:  Depression: I can't recall Anxiety:  I guess I do Mania (lifetime and current): none Psychosis: (lifetime and current): hallucinations on admission-positive for a UTI, some paranoia lingers   Review of Systems  Psychiatric/Behavioral:  Positive for memory loss. The patient is nervous/anxious.   All other systems reviewed and are negative.    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 Vital Signs:  Temp:  [97.9 F (36.6 C)-98.4 F (36.9 C)] 98.3 F (36.8 C) (11/04 1204) Pulse Rate:  [63-81] 65 (11/04 1204) Resp:  [18] 18 (11/04 1204) BP: (90-147)/(55-115) 90/55 (11/04 1204) SpO2:  [93 %-97 %] 97 % (11/04 1204) Weight:  [75.6 kg] 75.6 kg (11/03 1713) Blood pressure (!) 90/55, pulse 65, temperature 98.3 F (36.8 C), temperature source Oral, resp. rate 18, height 5' 1 (1.549 m), weight 75.6 kg, SpO2 97%. Body mass index is 31.49 kg/m.  Physical Exam Vitals and nursing note reviewed.  Constitutional:      Appearance: Normal appearance.  HENT:     Head: Normocephalic.     Nose: Nose normal.  Pulmonary:      Effort: Pulmonary effort is normal.  Musculoskeletal:     Cervical back: Normal range of motion.  Neurological:     Mental Status: She is alert.     Mental Status Exam: General Appearance: Casual  Orientation:  Other:  person and plcee  Memory:  Immediate;   Fair Recent;   Fair Remote;   Fair to poor for all memory  Concentration:  Concentration: Good and Attention Span: Good  Recall:  Fair  Attention  Fair  Eye Contact:  Good  Speech:  Clear and Coherent  Language:  Good  Volume:  Decreased  Mood: anxious  Affect:  Appropriate  Thought Process:  Coherent  Thought Content:  Logical with some paranoia  Suicidal Thoughts:  No  Homicidal Thoughts:  No  Judgement:  Impaired  Insight:  Lacking  Psychomotor Activity:  Decreased  Akathisia:  No  Fund of Knowledge:  Fair      Assets:  Housing Leisure Time Resilience Social Support  Cognition:  Impaired,  Moderate  ADL's:  Impaired  AIMS (if indicated):        Other History   These have been pulled in through the EMR, reviewed, and updated if appropriate.  Family History:  The patient's family history includes Anxiety disorder in her daughter and sister; Anxiety disorder (age of onset: 47) in her sister; Cerebral palsy (age of onset: 71) in her sister; Colon cancer (age of onset: 77) in her brother; Congestive Heart Failure (age of onset: 74) in her sister; Dementia in her sister; Depression in her daughter; Diabetes in her father; Heart Problems in her sister; Heart Problems (age of onset: 48) in her sister; Heart attack in her mother; Heart attack (age of onset: 40) in her father; Heart attack (age of onset: 50) in her brother and sister; Heart attack (age of onset: 26) in her sister; Heart disease in her father and mother; Hyperlipidemia in her father; Hypertension in her daughter and father; Hypertension (age of onset: 56) in her sister; Irritable bowel syndrome in her daughter; Prostate cancer (age of onset: 74) in her  brother; Stroke in her sister.  Medical History: 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   CKD (chronic kidney disease), stage IV (HCC)    Cognitive impairment    Colon polyps    Community acquired pneumonia    Depression    Diverticulosis    Dysuria    Encephalomalacia    GAD (generalized anxiety disorder)    GERD (gastroesophageal reflux disease)    Glaucoma    Hearing loss    Hemorrhoids    Hypertension    Hypothyroidism    Incontinence    Mixed hyperlipidemia    Myocardial infarction (HCC)    NSTEMI (non-ST elevated myocardial infarction) (HCC) 12/19/2011   OSA on CPAP    PAF (paroxysmal atrial fibrillation) (HCC)    Peripheral arterial disease    Pneumonitis 04/26/2015   Pyelonephritis due to Escherichia coli 04/28/2015   Sepsis (HCC)    Spondylosis of lumbar region without myelopathy or radiculopathy    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HCC)    Urticaria    Vertigo, benign positional     Surgical History: Past Surgical History:  Procedure Laterality Date   ABDOMINAL AORTOGRAM W/LOWER EXTREMITY N/A 08/22/2017   Procedure: ABDOMINAL AORTOGRAM W/LOWER EXTREMITY;  Surgeon: Serene Gaile ORN, MD;  Location: [facility] INVASIVE CV LAB;  Service: Cardiovascular;  Laterality: N/A;   BILATERAL UPPER EXTREMITY ANGIOGRAM N/A 09/11/2012   Procedure: BILATERAL UPPER EXTREMITY ANGIOGRAM;  Surgeon: Gaile ORN Serene, MD;  Location: [facility] CATH LAB;  Service: Cardiovascular;  Laterality: N/A;   BIOPSY  03/25/2020   Procedure: BIOPSY;  Surgeon: Wilhelmenia Aloha Raddle., MD;  Location: [facility] ENDOSCOPY;  Service: Gastroenterology;;   carotid duplex doppler Bilateral 09/03/2012, 11/03/2011   Evidence of  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 patent left common carotid- subclavian artery bypass with turbulent flow noted at the anastomosis with velocities of 295 cm/s  CAROTID-SUBCLAVIAN BYPASS GRAFT  12/15/2011   Procedure: BYPASS GRAFT CAROTID-SUBCLAVIAN;  Surgeon: Gaile LELON New, MD;  Location: [facility] OR;  Service: Vascular;  Laterality: Left;  Left Carotid subclavian bypass   CORONARY ANGIOPLASTY     CORONARY ARTERY BYPASS GRAFT     CORONARY ATHERECTOMY N/A 03/19/2020   Procedure: CORONARY ATHERECTOMY;  Surgeon: Dann Candyce RAMAN, MD;  Location: [facility] INVASIVE CV LAB;  Service: Cardiovascular;  Laterality: N/A;   CORONARY STENT INTERVENTION N/A 03/19/2020   Procedure: CORONARY STENT INTERVENTION;  Surgeon: Dann Candyce RAMAN, MD;  Location: [facility] INVASIVE CV LAB;  Service: Cardiovascular;  Laterality: N/A;   DOPPLER ECHOCARDIOGRAPHY  05/27/2010, 09/17/2008   Mild Proximal septal thickening is noted. Left ventricular systolic functions is normal ejection fraction =>55%. the aortic valve appears to be mildly sclerotic    ENDARTERECTOMY Right 12/13/2023   Procedure: ENDARTERECTOMY, CAROTID;  Surgeon: New Gaile LELON, MD;  Location: [facility] OR;  Service: Vascular;  Laterality: Right;   ESOPHAGOGASTRODUODENOSCOPY (EGD) WITH PROPOFOL  N/A 03/25/2020   Procedure: ESOPHAGOGASTRODUODENOSCOPY (EGD) WITH PROPOFOL ;  Surgeon: Wilhelmenia Aloha Raddle., MD;  Location: [facility] ENDOSCOPY;  Service: Gastroenterology;  Laterality: N/A;   fem-fem bypass graft  1999   holter monitor  01/21/2008   The predominant rhythm was normal sinus rhythm. Minimum heartrate of 63 bpm at +01:00, maximum heartrate of 105 bpm at + 10:35; and the average heartrate of 75 bpm. Ventricular ectopic activity totaled 1270: Multifocal; 866-PVC's and 404-VEs              JOINT REPLACEMENT     Left knee   LEFT HEART  CATH AND CORS/GRAFTS ANGIOGRAPHY N/A 03/19/2020   Procedure: LEFT HEART CATH AND CORS/GRAFTS ANGIOGRAPHY;  Surgeon: Dann Candyce RAMAN, MD;  Location: [facility] INVASIVE CV LAB;  Service: Cardiovascular;  Laterality: N/A;   LEFT HEART CATHETERIZATION WITH CORONARY/GRAFT ANGIOGRAM N/A 12/21/2011   Procedure: LEFT HEART CATHETERIZATION WITH EL BILE;  Surgeon: Alm LELON Clay, MD;  Location: [facility] CATH LAB;  Service: Cardiovascular;  Laterality: N/A;   NM MYOCAR PERF EJECTION FRACTION  09/22/2009, 07/03/2007   the post stress myocardial perfusion images show a normal pattern of perfusion is all regions. The post-stress ejection fraction is 68 %. no significant wall motion abnormalities noted. This is a low risk scan.   PATCH ANGIOPLASTY Right 12/13/2023   Procedure: ANGIOPLASTY, USING PATCH GRAFT;  Surgeon: New Gaile LELON, MD;  Location: [facility] OR;  Service: Vascular;  Laterality: Right;   PERIPHERAL VASCULAR INTERVENTION  08/22/2017   Procedure: PERIPHERAL VASCULAR INTERVENTION;  Surgeon: New Gaile LELON, MD;  Location: [facility] INVASIVE CV LAB;  Service: Cardiovascular;;  Fem/Fem Graft   REPLACEMENT TOTAL KNEE  05-2011   UNILATERAL UPPER EXTREMEITY ANGIOGRAM Left 11/15/2011   Procedure: UNILATERAL UPPER VENITA BILE;  Surgeon: Dorn JINNY Lesches, MD;  Location: [facility] CATH LAB;  Service: Cardiovascular;  Laterality: Left;     Medications:   Current Facility-Administered Medications:    acetaminophen  (TYLENOL ) tablet 650 mg, 650 mg, Oral, Q6H PRN, 650 mg at 06/11/24 1309 **OR** acetaminophen  (TYLENOL ) suppository 650 mg, 650 mg, Rectal, Q6H PRN, Foust, Katy L, NP   ALPRAZolam  (XANAX ) tablet 0.5 mg, 0.5 mg, Oral, Daily PRN, Mannie Pac T, DO, 0.5 mg at 06/11/24 1134   aspirin  EC tablet 81 mg, 81 mg, Oral, QHS, Mannie Pac T, DO, 81 mg at 06/10/24 2148   busPIRone  (BUSPAR ) tablet 15 mg, 15 mg, Oral, BID, Mannie Pac T, DO, 15 mg at 06/11/24 0933   cefTRIAXone  (ROCEPHIN ) 1 g in  sodium  chloride 0.9 % 100 mL IVPB, 1 g, Intravenous, Q24H, Foust, Katy L, NP, Last Rate: 200 mL/hr at 06/11/24 1046, 1 g at 06/11/24 1046   clopidogrel  (PLAVIX ) tablet 75 mg, 75 mg, Oral, Daily, Foust, Katy L, NP, 75 mg at 06/11/24 9065   divalproex  (DEPAKOTE  ER) 24 hr tablet 250 mg, 250 mg, Oral, QHS, Mannie Pac T, DO, 250 mg at 06/10/24 2149   dorzolamide  (TRUSOPT ) 2 % ophthalmic solution 1 drop, 1 drop, Both Eyes, BID, Mannie Pac T, DO, 1 drop at 06/10/24 2150   escitalopram  (LEXAPRO ) tablet 20 mg, 20 mg, Oral, Daily, Mannie Pac T, DO, 20 mg at 06/11/24 0933   ezetimibe  (ZETIA ) tablet 10 mg, 10 mg, Oral, Daily, Mannie Pac T, DO, 10 mg at 06/11/24 9066   furosemide  (LASIX ) tablet 40 mg, 40 mg, Oral, Daily PRN, Mannie Pac T, DO   gabapentin  (NEURONTIN ) capsule 200 mg, 200 mg, Oral, Q1200, Foust, Katy L, NP, 200 mg at 06/11/24 1133   haloperidol  lactate (HALDOL ) injection 2 mg, 2 mg, Intravenous, Q6H PRN, Foust, Katy L, NP   heparin  injection 5,000 Units, 5,000 Units, Subcutaneous, Q8H, Foust, Katy L, NP, 5,000 Units at 06/11/24 0934   latanoprost  (XALATAN ) 0.005 % ophthalmic solution 1 drop, 1 drop, Both Eyes, QHS, Foust, Katy L, NP   [START ON 06/12/2024] levothyroxine  (SYNTHROID ) tablet 200 mcg, 200 mcg, Oral, Q0600, Adhikari, Amrit, MD   metoprolol  succinate (TOPROL -XL) 24 hr tablet 25 mg, 25 mg, Oral, Daily, Mannie Pac T, DO, 25 mg at 06/11/24 0933   ondansetron  (ZOFRAN ) tablet 4 mg, 4 mg, Oral, Q6H PRN **OR** ondansetron  (ZOFRAN ) injection 4 mg, 4 mg, Intravenous, Q6H PRN, Foust, Katy L, NP   pantoprazole  (PROTONIX ) EC tablet 40 mg, 40 mg, Oral, Daily, Mannie Pac T, DO, 40 mg at 06/11/24 0934   polyethylene glycol (MIRALAX  / GLYCOLAX ) packet 17 g, 17 g, Oral, Daily, Mannie Pac T, DO, 17 g at 06/11/24 0934   polyethylene glycol (MIRALAX  / GLYCOLAX ) packet 17 g, 17 g, Oral, Daily PRN, Foust, Katy L, NP   QUEtiapine  (SEROQUEL ) tablet 25 mg, 25 mg, Oral, QHS,  Mannie Pac T, DO, 25 mg at 06/10/24 2148   QUEtiapine  (SEROQUEL ) tablet 25 mg, 25 mg, Oral, Daily, Adhikari, Amrit, MD   rosuvastatin  (CRESTOR ) tablet 40 mg, 40 mg, Oral, Daily, Foust, Katy L, NP, 40 mg at 06/11/24 0933   senna-docusate (Senokot-S) tablet 1 tablet, 1 tablet, Oral, BID, Foust, Katy L, NP, 1 tablet at 06/11/24 0935  Allergies: Allergies  Allergen Reactions   Lidocaine  Swelling and Other (See Comments)    Use CORRECT dose for age/weight- red welts and tongue swelling result, if not   Cymbalta  [Duloxetine  Hcl] Other (See Comments)    Increased confusion and memory concerns    Donepezil Other (See Comments)    Altered mood and Aggression     Mobic [Meloxicam] Other (See Comments)    Heart failure- cannot take   Namenda [Memantine] Other (See Comments)    Severe aggression   Nsaids Other (See Comments)    Cannot have due to heart issues   Other Other (See Comments)    No salt or anything that might cause fluid retention/swelling!!   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, however(??)   Vicodin [Hydrocodone-Acetaminophen ] Other (See Comments)    Delirium, Confusion, and Toxic dementia    Sharlot Becker, NP

## 2024-06-11 NOTE — Evaluation (Signed)
 Physical Therapy Evaluation Patient Details Name: Deanna [name] MRN: [id number] DOB: 31-Mar-1941 Today's Date: 06/11/2024  History of Present Illness  Deanna [name] is a 83 y.o. female admitted11/3/25 for AMS, behavioral changes including worsening confusion and paranoia. PMH:R frontal white matter small infarct, TBI, dementia, paroxysmal atrial fibrillation, PAD,  right carotid artery endarterectomy and angioplasty, hypothyroidism, CKD, CAD s/p stent, anxiety and depression.  Clinical Impression   The patient  is able to participate in mobility. Ambulated with min assistance using Rw in room. Patient  will benefit form 24/7 support. Disposition per TOC. May benefit from LTSNF .  Pt admitted with above diagnosis.  Pt currently with functional limitations due to the deficits listed below (see PT Problem List). Pt will benefit from acute skilled PT to increase their independence and safety with mobility to allow discharge.       If plan is discharge home, recommend the following: A little help with walking and/or transfers;A little help with bathing/dressing/bathroom;Help with stairs or ramp for entrance;Assist for transportation   Can travel by private vehicle        Equipment Recommendations None recommended by PT  Recommendations for Other Services       Functional Status Assessment Patient has had a recent decline in their functional status and/or demonstrates limited ability to make significant improvements in function in a reasonable and predictable amount of time     Precautions / Restrictions Precautions Precautions: Fall      Mobility  Bed Mobility Overal bed mobility: Needs Assistance Bed Mobility: Supine to Sit     Supine to sit: Min assist     General bed mobility comments: assist to scoot to bed edge    Transfers Overall transfer level: Needs assistance Equipment used: Rolling walker (2 wheels) Transfers: Sit to/from Stand Sit to Stand: Min assist            General transfer comment: multimodal cues  for activity    Ambulation/Gait Ambulation/Gait assistance: Min assist Gait Distance (Feet): 20 Feet (x2) Assistive device: Rolling walker (2 wheels) Gait Pattern/deviations: Step-through pattern, Staggering right, Staggering left       General Gait Details: cues   to keep holding onto RW, assist with turning . cues to wash hands  Stairs            Wheelchair Mobility     Tilt Bed    Modified Rankin (Stroke Patients Only)       Balance Overall balance assessment: Needs assistance Sitting-balance support: Bilateral upper extremity supported, Feet supported Sitting balance-Leahy Scale: Fair     Standing balance support: Reliant on assistive device for balance, During functional activity, Bilateral upper extremity supported Standing balance-Leahy Scale: Poor                               Pertinent Vitals/Pain Pain Assessment Pain Assessment: No/denies pain    Home Living Family/patient expects to be discharged to:: Unsure                        Prior Function Prior Level of Function : Patient poor historian/Family not available;Independent/Modified Independent             Mobility Comments: Pt unable to report PLOF. ADLs Comments: Pt unable to state PLOF. Pt reports she loves music.     Extremity/Trunk Assessment        Lower Extremity Assessment Lower Extremity Assessment:  Generalized weakness    Cervical / Trunk Assessment Cervical / Trunk Assessment: Normal  Communication   Communication Communication: No apparent difficulties    Cognition Arousal: Alert Behavior During Therapy: Anxious                           PT - Cognition Comments: constantly state don't let anyone hurt me.         Cueing Cueing Techniques: Verbal cues, Gestural cues     General Comments      Exercises     Assessment/Plan    PT Assessment Patient needs continued PT  services  PT Problem List Decreased strength;Decreased cognition;Decreased activity tolerance;Decreased mobility;Decreased balance;Decreased safety awareness       PT Treatment Interventions DME instruction;Gait training;Functional mobility training;Therapeutic activities;Therapeutic exercise;Patient/family education    PT Goals (Current goals can be found in the Care Plan section)  Acute Rehab PT Goals PT Goal Formulation: Patient unable to participate in goal setting Time For Goal Achievement: 06/25/24 Potential to Achieve Goals: Fair    Frequency Min 2X/week     Co-evaluation               AM-PAC PT 6 Clicks Mobility  Outcome Measure Help needed turning from your back to your side while in a flat bed without using bedrails?: A Little Help needed moving from lying on your back to sitting on the side of a flat bed without using bedrails?: A Little Help needed moving to and from a bed to a chair (including a wheelchair)?: A Lot Help needed standing up from a chair using your arms (e.g., wheelchair or bedside chair)?: A Lot Help needed to walk in hospital room?: A Lot Help needed climbing 3-5 steps with a railing? : Total 6 Click Score: 13    End of Session Equipment Utilized During Treatment: Gait belt Activity Tolerance: Patient tolerated treatment well Patient left: in bed;with call bell/phone within reach;with bed alarm set Nurse Communication: Mobility status PT Visit Diagnosis: Unsteadiness on feet (R26.81);Other abnormalities of gait and mobility (R26.89)    Time: 1000-1024 PT Time Calculation (min) (ACUTE ONLY): 24 min   Charges:   PT Evaluation $PT Eval Low Complexity: 1 Low PT Treatments $Gait Training: 8-22 mins PT General Charges $$ ACUTE PT VISIT: 1 Visit         Darice Potters PT Acute Rehabilitation Services Office [phone]   Potters Darice Norris 06/11/2024, 4:34 PM

## 2024-06-11 NOTE — Plan of Care (Signed)

## 2024-06-12 DIAGNOSIS — F03918 Unspecified dementia, unspecified severity, with other behavioral disturbance: Secondary | ICD-10-CM | POA: Diagnosis not present

## 2024-06-12 DIAGNOSIS — Z8673 Personal history of transient ischemic attack (TIA), and cerebral infarction without residual deficits: Secondary | ICD-10-CM

## 2024-06-12 DIAGNOSIS — N3000 Acute cystitis without hematuria: Secondary | ICD-10-CM | POA: Diagnosis not present

## 2024-06-12 LAB — BASIC METABOLIC PANEL WITH GFR
Anion gap: 8 (ref 5–15)
BUN: 21 mg/dL (ref 8–23)
CO2: 27 mmol/L (ref 22–32)
Calcium: 8.8 mg/dL — ABNORMAL LOW (ref 8.9–10.3)
Chloride: 104 mmol/L (ref 98–111)
Creatinine, Ser: 1.7 mg/dL — ABNORMAL HIGH (ref 0.44–1.00)
GFR, Estimated: 30 mL/min — ABNORMAL LOW (ref >60)
Glucose, Bld: 100 mg/dL — ABNORMAL HIGH (ref 70–99)
Potassium: 4.6 mmol/L (ref 3.5–5.1)
Sodium: 139 mmol/L (ref 135–145)

## 2024-06-12 LAB — CBC
HCT: 37.9 % (ref 36.0–46.0)
Hemoglobin: 11.2 g/dL — ABNORMAL LOW (ref 12.0–15.0)
MCH: 26.9 pg (ref 26.0–34.0)
MCHC: 29.6 g/dL — ABNORMAL LOW (ref 30.0–36.0)
MCV: 91.1 fL (ref 80.0–100.0)
Platelets: 179 K/uL (ref 150–400)
RBC: 4.16 MIL/uL (ref 3.87–5.11)
RDW: 13.3 % (ref 11.5–15.5)
WBC: 5.3 K/uL (ref 4.0–10.5)
nRBC: 0 % (ref 0.0–0.2)

## 2024-06-12 MED ORDER — LEVOTHYROXINE SODIUM 25 MCG PO TABS
175.0000 ug | ORAL_TABLET | Freq: Every day | ORAL | Status: DC
  Administered 2024-06-13 – 2024-06-14 (×2): 175 ug via ORAL
  Filled 2024-06-12 (×2): qty 1
  Filled 2024-06-12: qty 2

## 2024-06-12 NOTE — Plan of Care (Signed)

## 2024-06-12 NOTE — Progress Notes (Signed)
 Progress Note    Deanna [name]   FMW:[id number]  DOB: [birth year]  DOA: 06/10/2024     0 PCP: Caro [name] POUR, NP  Initial CC: confusion, paranoia   Hospital Course: [name] is an 83 yo female with PMH CKD stage IIIb, osteoarthritis, TBI, GERD, OSA, hypothyroidism, prior CVA, paroxysmal A-fib, CHF, hypertension, HLD, coronary artery disease, CABG, carotid artery stenosis, anxiety, depression, dementia who was brought from home by the daughter for evaluation of increased confusion, paranoia.  She was recently here at [facility], ED for the same and was seen by psychiatry and was discharged home.  She was treated for psychosis at that time.  Family were trying to pursue outpatient placement but unsuccessful.  She has been noticed to have increased paranoia, psychosis, hostile at home.  On prednisone , she was hemodynamically stable.  UDS was negative.  UA showed moderate leukocytes, rare bacteria.  UTI suspected.  CT abdomen/pelvis showed cystitis, constipation, numerous lytic areas scattered throughout the pelvis and bilateral femur.  Psychiatry consulted.  Started on antibiotic for possible UTI. Of note, CT head also shows generalized cerebral atrophy with chronic bilateral frontal lobe infarcts and notable amount of encephalomalacia and volume loss of bilateral anterior frontal lobes.    Assessment and plan:   Confusion, memory loss, paranoia Acute metabolic encephalopathy - hx psychosis/paranoia but given description of also slow memory and behavior changes the suspicion for dementia is increased; furthermore, s/p CVA in May 2025 with B/L frontal lobe infarcts and now with progressive encephalomalacia and volume loss of both anterior frontal lobes also places her at higher predisposition for vascular dementia due to this - Minimal improvement on Rocephin  for presumed UTI also further supports probable underlying dementia with behavior changes; agree with psychiatry assessment -  Continue Seroquel , BuSpar , Xanax  per psychiatry  Acute cystitis - UA noted with moderate LE, negative nitrite, greater than 50 WBC - Urine culture growing 60,000 k E. Coli; will plan on completing 3-day course of Rocephin    History of anxiety/depression/dementia - see above    Constipation - Continue MiraLAX , Senokot.  Abdomen is soft and nondistended   Lytic bone lesions - Incidental finding.  CT imaging showed scattered lytic lesions throughout the pelvis and bilateral femur - NM bone scan negative but not sufficient in itself for MM diagnosis - does not have other CRAB criteria, so suspicion for MM still low - Hold off on further workup at this time  Hypothyroidism - On Synthroid  150 mcg at home prior to admission - TSH elevated at 25.1; free T4 0.72 - Suspect forgetfulness of taking medication and/or contribution from paranoia - Will increase some but needs repeat TSH and free T4 in 4 to 6 weeks   Hypertension - continue metoprolol    History of chronic combined systolic/diastolic CHF - Currently appears euvolemic - Continue metoprolol .  Last echo done on 12/2023 showed EF of 40 to 45%, grade 2 diastolic dysfunction   History of paroxysmal A-fib - Currently in normal sinus rhythm.  Continue metoprolol    History of CVA CAD Carotid artery disease - On aspirin  and Plavix  which will be continued   History of hyperlipidemia - On Zetia , rosuvastatin    CKD stage III - Currently kidney function at baseline  Interval History:  No events overnight.  Sitting in recliner when seen this morning.  Still talking about being paranoid from someone pretending to be her daughter at home. Denied having any urinary symptoms when asked prior to admission.   Antimicrobials: Rocephin   06/11/2024 >> current  DVT prophylaxis:  heparin  injection 5,000 Units Start: 06/11/24 1000   Code Status:   Code Status: Full Code  Mobility Assessment (Last 72 Hours)     Mobility Assessment      Row Name 06/12/24 1626 06/12/24 0822 06/11/24 2300 06/11/24 1632 06/11/24 0818   Does the patient have exclusion criteria? -- No - Perform mobility assessment No - Perform mobility assessment -- No - Perform mobility assessment   What is the highest level of mobility based on the mobility assessment? Level 4 (Ambulates with assistance) - Balance while stepping forward/back - Complete Level 5 (Ambulates independently) - Balance while walking independently - Complete Level 4 (Ambulates with assistance) - Balance while stepping forward/back - Complete Level 4 (Ambulates with assistance) - Balance while stepping forward/back - Complete Level 5 (Ambulates independently) - Balance while walking independently - Complete      Diet: Diet Orders (From admission, onward)     Start     Ordered   06/11/24 0718  Diet Heart Room service appropriate? Yes; Fluid consistency: Thin  Diet effective now       Question Answer Comment  Room service appropriate? Yes   Fluid consistency: Thin      06/11/24 0720            Barriers to discharge:  Disposition Plan: TBD HH orders placed: TBD Status is: Observation  Objective: Blood pressure 118/70, pulse 61, temperature 98.3 F (36.8 C), resp. rate 16, height 5' 1 (1.549 m), weight 75.6 kg, SpO2 94%.  Examination:  Physical Exam Constitutional:      Appearance: Normal appearance.  HENT:     Head: Normocephalic and atraumatic.     Mouth/Throat:     Mouth: Mucous membranes are moist.  Eyes:     Extraocular Movements: Extraocular movements intact.  Cardiovascular:     Rate and Rhythm: Normal rate and regular rhythm.  Pulmonary:     Effort: Pulmonary effort is normal. No respiratory distress.     Breath sounds: Normal breath sounds. No wheezing.  Abdominal:     General: Bowel sounds are normal. There is no distension.     Palpations: Abdomen is soft.     Tenderness: There is no abdominal tenderness.  Musculoskeletal:        General: Normal range  of motion.     Cervical back: Normal range of motion and neck supple.  Skin:    General: Skin is warm and dry.  Neurological:     Mental Status: She is alert. She is disoriented.  Psychiatric:        Mood and Affect: Mood is anxious.        Thought Content: Thought content is paranoid.      Consultants:  Psychiatry  Procedures:    Data Reviewed: Results for orders placed or performed during the hospital encounter of 06/10/24 (from the past 24 hours)  CBC     Status: Abnormal   Collection Time: 06/12/24  4:42 AM  Result Value Ref Range   WBC 5.3 4.0 - 10.5 K/uL   RBC 4.16 3.87 - 5.11 MIL/uL   Hemoglobin 11.2 (L) 12.0 - 15.0 g/dL   HCT 62.0 63.9 - 53.9 %   MCV 91.1 80.0 - 100.0 fL   MCH 26.9 26.0 - 34.0 pg   MCHC 29.6 (L) 30.0 - 36.0 g/dL   RDW 86.6 88.4 - 84.4 %   Platelets 179 150 - 400 K/uL   nRBC 0.0 0.0 -  0.2 %  Basic metabolic panel     Status: Abnormal   Collection Time: 06/12/24  4:42 AM  Result Value Ref Range   Sodium 139 135 - 145 mmol/L   Potassium 4.6 3.5 - 5.1 mmol/L   Chloride 104 98 - 111 mmol/L   CO2 27 22 - 32 mmol/L   Glucose, Bld 100 (H) 70 - 99 mg/dL   BUN 21 8 - 23 mg/dL   Creatinine, Ser 8.29 (H) 0.44 - 1.00 mg/dL   Calcium  8.8 (L) 8.9 - 10.3 mg/dL   GFR, Estimated 30 (L) >60 mL/min   Anion gap 8 5 - 15    I have reviewed pertinent nursing notes, vitals, labs, and images as necessary. I have ordered labwork to follow up on as indicated.  I have reviewed the last notes from staff over past 24 hours. I have discussed patient's care plan and test results with nursing staff, CM/SW, and other staff as appropriate.  Old records reviewed in assessment of this patient  Time spent: Greater than 50% of the 55 minute visit was spent in counseling/coordination of care for the patient as laid out in the A&P.   LOS: 0 days   Alm Apo, MD [facility] 06/12/2024, 5:58 PM

## 2024-06-12 NOTE — Plan of Care (Signed)
   Problem: Education: Goal: Knowledge of General Education information will improve Description: Including pain rating scale, medication(s)/side effects and non-pharmacologic comfort measures Outcome: Progressing   Problem: Clinical Measurements: Goal: Ability to maintain clinical measurements within normal limits will improve Outcome: Progressing

## 2024-06-12 NOTE — TOC Initial Note (Addendum)
 Transition of Care [facility]) - Initial/Assessment Note    Patient Details  Name: Deanna [name] MRN: [id number] Date of Birth: 10/11/1940  [facility] CM/SW Contact:    Doneta Glenys DASEN, RN Phone Number: 06/12/2024, 3:24 PM  Clinical Narrative:                 CM has called Hiyab Nhem (dlt) [phone] and left message. Patient is not capable of providing information. With approval from patient who states I'm ok with going to a facility. SNF referrals sent out. Will wait on beb offers. 3:49 PM CM called Bruno (dlt) left message to return call.  Patient Goals and CMS Choice            Expected Discharge Plan and Services                                              Prior Living Arrangements/Services                       Activities of Daily Living      Permission Sought/Granted                  Emotional Assessment              Admission diagnosis:  Acute cystitis [N30.00] Cystitis [N30.90] Dementia, unspecified dementia severity, unspecified dementia type, unspecified whether behavioral, psychotic, or mood disturbance or anxiety (HCC) [F03.90] Patient Active Problem List   Diagnosis Date Noted   Acute metabolic encephalopathy 06/11/2024   Paranoia (HCC) 06/11/2024   Constipation 06/11/2024   Lytic bone lesions on xray 06/11/2024   Acute cystitis 06/10/2024   Dementia with behavioral disturbance (HCC) 04/28/2024   Altered mental status 04/24/2024   Psychosis (HCC) 04/24/2024   Acute CVA (cerebrovascular accident) (HCC) 12/20/2023   Hypoxia 12/16/2023   Carotid artery stenosis, asymptomatic, right 12/13/2023   Left arm pain 08/17/2023   Delirium 08/17/2023   UTI (urinary tract infection) 08/16/2023   Anxiety 04/24/2023   Acute respiratory failure with hypoxia (HCC) 06/17/2022   COVID-19 virus infection 06/17/2022   Stage 3b chronic kidney disease (CKD) (HCC) - baseline SCr 1.8 06/17/2022   PAD  (peripheral artery disease) 10/11/2021   PAF (paroxysmal atrial fibrillation) (HCC) 10/11/2021   Hypokalemia 05/09/2021   Status post coronary artery stent placement    Overactive bladder 12/16/2019   Generalized anxiety disorder 06/17/2018   Mood disorder 06/17/2018   Chronic diastolic CHF (congestive heart failure) (HCC) 11/16/2017   Primary open angle glaucoma of both eyes, moderate stage 11/09/2017   Risk for falls 11/22/2016   Hearing disorder 11/03/2016   Spondylosis of lumbar region without myelopathy or radiculopathy 02/25/2016   History of traumatic brain injury 11/04/2015   Primary osteoarthritis of both hips 08/17/2015   Cognitive impairment 07/08/2015   OSA on CPAP 04/26/2015   Chronic pain 04/26/2015   Physical deconditioning 04/26/2015   Hyperlipidemia 07/02/2014   Carotid artery stenosis 03/26/2012   Shortness of breath 12/19/2011    Class: Acute   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, 2011 11/28/2011   History of colonic polyps 06/18/2010   Hypothyroidism 11/17/2008   Obesity, Class III, BMI 40-49.9 (morbid obesity) (HCC) 11/17/2008   ANXIETY DEPRESSION 11/17/2008   Essential hypertension 11/17/2008   HEMORRHOIDS 11/17/2008   GERD 11/17/2008   IBS 11/17/2008  Atherosclerosis of coronary artery bypass graft(s), unspecified, with other forms of angina pectoris    PCP:  Caro [name] POUR, NP Pharmacy:   [facility] [phone] [address], [place] - [address] AT [facility] OF [facility] [address] [place] [address] Phone: [phone] Fax: [phone]     Social Drivers of Health (SDOH) Social History: SDOH Screenings   Food Insecurity: Food Insecurity Present (06/12/2024)  Housing: Patient Unable To Answer (06/12/2024)  Transportation Needs: Patient Unable To Answer (06/12/2024)  Utilities: Patient Unable To Answer (06/12/2024)  Depression (PHQ2-9): Low Risk  (05/01/2024)  Social Connections: Patient Unable To Answer  (06/12/2024)  Tobacco Use: Medium Risk (06/10/2024)   SDOH Interventions: Utilities Interventions: Patient Unable to Answer Social Connections Interventions: Patient Unable to Answer   Readmission Risk Interventions    08/18/2023    2:28 PM  Readmission Risk Prevention Plan  Post Dischage Appt Complete  Medication Screening Complete  Transportation Screening Complete

## 2024-06-12 NOTE — Hospital Course (Addendum)
 Deanna [name] is an 83 yo female with PMH CKD stage IIIb, osteoarthritis, TBI, GERD, OSA, hypothyroidism, prior CVA, paroxysmal A-fib, CHF, hypertension, HLD, coronary artery disease, CABG, carotid artery stenosis, anxiety, depression, dementia who was brought from home by the daughter for evaluation of increased confusion, paranoia.  She was recently here at [facility], ED for the same and was seen by psychiatry and was discharged home.  She was treated for psychosis at that time.  Family were trying to pursue outpatient placement but unsuccessful.  She has been noticed to have increased paranoia, psychosis, hostile at home.  On prednisone , she was hemodynamically stable.  UDS was negative.  UA showed moderate leukocytes, rare bacteria.  UTI suspected.  CT abdomen/pelvis showed cystitis, constipation, numerous lytic areas scattered throughout the pelvis and bilateral femur.  Psychiatry consulted.  Started on antibiotic for possible UTI. Of note, CT head also shows generalized cerebral atrophy with chronic bilateral frontal lobe infarcts and notable amount of encephalomalacia and volume loss of bilateral anterior frontal lobes.    Assessment and plan:   Confusion, memory loss, paranoia Acute metabolic encephalopathy - hx psychosis/paranoia but given description of also slow memory and behavior changes the suspicion for dementia is increased; furthermore, s/p CVA in May 2025 with B/L frontal lobe infarcts and now with progressive encephalomalacia and volume loss of both anterior frontal lobes also places her at higher predisposition for vascular dementia due to this - Minimal improvement on Rocephin  for presumed UTI also further supports probable underlying dementia with behavior changes; agree with psychiatry assessment - Continue Seroquel , BuSpar , Xanax  per psychiatry  Acute cystitis - UA noted with moderate LE, negative nitrite, greater than 50 WBC - Urine culture growing 60,000 k E. Coli; will plan on  completing 3-day course of Rocephin ; completed 06/13/2024   History of anxiety/depression/dementia - see above    Constipation - Continue MiraLAX , Senokot.  Abdomen is soft and nondistended   Lytic bone lesions - Incidental finding.  CT imaging showed scattered lytic lesions throughout the pelvis and bilateral femur - NM bone scan negative but not sufficient in itself for MM diagnosis - does not have other CRAB criteria, so suspicion for MM still low - Hold off on further workup at this time  Hypothyroidism - On Synthroid  150 mcg at home prior to admission - TSH elevated at 25.1; free T4 0.72 - Suspect forgetfulness of taking medication and/or contribution from paranoia - Will increase some but needs repeat TSH and free T4 in 4 to 6 weeks   Hypertension - continue metoprolol    History of chronic combined systolic/diastolic CHF - Currently appears euvolemic - Continue metoprolol .  Last echo done on 12/2023 showed EF of 40 to 45%, grade 2 diastolic dysfunction   History of paroxysmal A-fib - Currently in normal sinus rhythm.  Continue metoprolol    History of CVA CAD Carotid artery disease - On aspirin  and Plavix  which will be continued   History of hyperlipidemia - On Zetia , rosuvastatin    CKD stage III - Currently kidney function at baseline

## 2024-06-12 NOTE — Care Management Obs Status (Signed)
 MEDICARE OBSERVATION STATUS NOTIFICATION   Patient Details  Name: Deanna [name] MRN: [id number] Date of Birth: 1941/05/14   Medicare Observation Status Notification Given:  Yes    Doneta Glenys DASEN, RN 06/12/2024, 3:48 PM

## 2024-06-12 NOTE — Evaluation (Signed)
 Occupational Therapy Evaluation Patient Details Name: Deanna [name] MRN: [id number] DOB: 09/30/1940 Today's Date: 06/12/2024   History of Present Illness   Deanna [name] is a 83 yr old female admitted11/3/25 for AMS, behavioral changes including worsening confusion and paranoia. PMH:R frontal white matter small infarct, TBI, dementia, paroxysmal atrial fibrillation, PAD,  right carotid artery endarterectomy and angioplasty, hypothyroidism, CKD, CAD s/p stent, anxiety and depression.     Clinical Impressions The pt is currently presenting below her reported baseline level of functioning for self-care management. She is limited by the below listed deficits (see OT problem list). As such, her occupational performance is compromised and she requires assist for self-care management. During the session, she required assist for sit to stand using a RW, for lower body dressing, and for short distance ambulation using a RW. She reported increased low back and buttocks with sitting and standing tasks. She was also noted to be with intermittent confusion, anxiousness and paranoid thoughts. She will benefit from further OT services to maximize her safety and independence with self-care tasks. Patient will benefit from continued inpatient follow up therapy, <3 hours/day.      If plan is discharge home, recommend the following:   Direct supervision/assist for medications management;Direct supervision/assist for financial management;Assistance with cooking/housework;Assist for transportation;Supervision due to cognitive status;A little help with bathing/dressing/bathroom     Functional Status Assessment   Patient has had a recent decline in their functional status and demonstrates the ability to make significant improvements in function in a reasonable and predictable amount of time.     Equipment Recommendations   None recommended by OT     Recommendations for Other Services          Precautions/Restrictions   Precautions Precautions: Fall     Mobility Bed Mobility Overal bed mobility: Needs Assistance Bed Mobility: Supine to Sit     Supine to sit: Min assist, HOB elevated, Used rails          Transfers Overall transfer level: Needs assistance Equipment used: Rolling walker (2 wheels) Transfers: Sit to/from Stand Sit to Stand: Min assist           Balance     Sitting balance-Leahy Scale: Fair         Standing balance comment: CGA to min assist with RW           ADL either performed or assessed with clinical judgement   ADL Overall ADL's : Needs assistance/impaired Eating/Feeding: Independent;Sitting   Grooming: Set up;Supervision/safety;Sitting   Upper Body Bathing: Set up;Supervision/ safety;Sitting   Lower Body Bathing: Moderate assistance;Sitting/lateral leans   Upper Body Dressing : Set up;Supervision/safety;Sitting   Lower Body Dressing: Moderate assistance;Sitting/lateral leans Lower Body Dressing Details (indicate cue type and reason): Pt had difficulty doffing and donning her socks seated EOB, requiring assist in this regard. Toilet Transfer: Minimal assistance;Cueing for safety;Rolling walker (2 wheels);Ambulation;Grab bars   Toileting- Clothing Manipulation and Hygiene: Minimal assistance;Sit to/from stand;Cueing for safety;Moderate assistance Toileting - Clothing Manipulation Details (indicate cue type and reason): at bathroom level based on clinical judgement             Vision Baseline Vision/History: 1 Wears glasses Additional Comments: She was unable to read the time depicted on the wall clock while wearing glasses. She stated her vision is impaired at her baseline.            Pertinent Vitals/Pain Pain Assessment Pain Assessment: Faces Pain Score: 4  Pain Location: She denied having pain  at rest, however reported increased low back and buttocks pain with sitting EOB and standing. Pain Descriptors /  Indicators: Sore Pain Intervention(s): Monitored during session, Repositioned, Limited activity within patient's tolerance     Extremity/Trunk Assessment Upper Extremity Assessment Upper Extremity Assessment: LUE deficits/detail;RUE deficits/detail RUE Deficits / Details: AROM WFL. Grip strength 4/5 LUE Deficits / Details: Shoulder AROM limitations with AROM for shoulder flexion being ~110 degrees based on visual inspection; she reported ROM deficit to be chronic in nature. Elbow and hand AROM WFL. Grip strength 4/5   Lower Extremity Assessment Lower Extremity Assessment: Generalized weakness;RLE deficits/detail;LLE deficits/detail RLE Deficits / Details: AROM WFL LLE Deficits / Details: AROM WFL       Communication Communication Communication: No apparent difficulties   Cognition Arousal: Alert Behavior During Therapy: Anxious Cognition: History of cognitive impairments, No family/caregiver present to determine baseline, Cognition impaired       Memory impairment (select all impairments): Declarative long-term memory, Working memory Attention impairment (select first level of impairment): Divided attention Executive functioning impairment (select all impairments): Reasoning OT - Cognition Comments: Oriented to person, place, and year, Disoriented to month. Able to follow 1 step commands consistently. Anxious. Patient intermittently stated she didn't want anyone to come into her room and hurt her. She also stated there are people trying to impersonate her and her daughter.                    Home Living Family/patient expects to be discharged to:: Private residence Living Arrangements: Alone   Type of Home: Other(Comment) (Condo) Home Access: Level entry     Home Layout: One level     Bathroom Shower/Tub: Walk-in shower         Home Equipment: Shower seat - built [employer] (2 wheels)   Additional Comments:  (Information reported here may need to be  verified from a reliable source.)      Prior Functioning/Environment Prior Level of Function : Patient poor historian/Family not available;Independent/Modified Independent             Mobility Comments:  (She reported being independent with ADLs, cooking and cleaning. She does not drive.) ADLs Comments:  (She reported occasional use of a RW for ambulation.)    OT Problem List:     OT Treatment/Interventions: Self-care/ADL training;Therapeutic exercise;Therapeutic activities;Cognitive remediation/compensation;Energy conservation;Patient/family education;DME and/or AE instruction;Balance training      OT Goals(Current goals can be found in the care plan section)   Acute Rehab OT Goals OT Goal Formulation: With patient Time For Goal Achievement: 06/26/24 Potential to Achieve Goals: Good ADL Goals Pt Will Perform Lower Body Dressing: with supervision;with set-up;sitting/lateral leans;sit to/from stand Pt Will Transfer to Toilet: with supervision;ambulating Pt Will Perform Toileting - Clothing Manipulation and hygiene: with supervision;sit to/from stand   OT Frequency:  Min 2X/week       AM-PAC OT 6 Clicks Daily Activity     Outcome Measure Help from another person eating meals?: None Help from another person taking care of personal grooming?: A Little Help from another person toileting, which includes using toliet, bedpan, or urinal?: A Lot Help from another person bathing (including washing, rinsing, drying)?: A Lot Help from another person to put on and taking off regular upper body clothing?: A Little Help from another person to put on and taking off regular lower body clothing?: A Lot 6 Click Score: 16   End of Session Equipment Utilized During Treatment: Rolling walker (2 wheels) Nurse Communication: Mobility status  Activity Tolerance: Patient limited by pain Patient left: in chair;with call bell/phone within reach;with chair alarm set  OT Visit Diagnosis:  Pain;Unsteadiness on feet (R26.81);Muscle weakness (generalized) (M62.81);Other symptoms and signs involving cognitive function Pain - part of body:  (low back and buttocks)                Time: 8794-8771 OT Time Calculation (min): 23 min Charges:  OT General Charges $OT Visit: 1 Visit OT Evaluation $OT Eval Moderate Complexity: 1 Mod OT Treatments $Therapeutic Activity: 8-22 mins   Delanna JINNY Lesches, OTR/L 06/12/2024, 4:29 PM

## 2024-06-13 DIAGNOSIS — N3000 Acute cystitis without hematuria: Secondary | ICD-10-CM | POA: Diagnosis not present

## 2024-06-13 DIAGNOSIS — Z8673 Personal history of transient ischemic attack (TIA), and cerebral infarction without residual deficits: Secondary | ICD-10-CM | POA: Diagnosis not present

## 2024-06-13 DIAGNOSIS — F03918 Unspecified dementia, unspecified severity, with other behavioral disturbance: Secondary | ICD-10-CM | POA: Diagnosis not present

## 2024-06-13 LAB — URINE CULTURE: Culture: 60000 — AB

## 2024-06-13 LAB — BASIC METABOLIC PANEL WITH GFR
Anion gap: 8 (ref 5–15)
BUN: 20 mg/dL (ref 8–23)
CO2: 30 mmol/L (ref 22–32)
Calcium: 9.4 mg/dL (ref 8.9–10.3)
Chloride: 102 mmol/L (ref 98–111)
Creatinine, Ser: 1.61 mg/dL — ABNORMAL HIGH (ref 0.44–1.00)
GFR, Estimated: 32 mL/min — ABNORMAL LOW (ref >60)
Glucose, Bld: 94 mg/dL (ref 70–99)
Potassium: 4.9 mmol/L (ref 3.5–5.1)
Sodium: 140 mmol/L (ref 135–145)

## 2024-06-13 LAB — MAGNESIUM: Magnesium: 2.3 mg/dL (ref 1.7–2.4)

## 2024-06-13 MED ORDER — FENTANYL CITRATE (PF) 50 MCG/ML IJ SOSY
12.5000 ug | PREFILLED_SYRINGE | Freq: Once | INTRAMUSCULAR | Status: AC
  Administered 2024-06-13: 12.5 ug via INTRAVENOUS
  Filled 2024-06-13: qty 1

## 2024-06-13 NOTE — TOC Progression Note (Addendum)
 Transition of Care [facility]) - Progression Note    Patient Details  Name: Deanna [name] MRN: [id number] Date of Birth: 1940-08-11  Transition [facility] Care [facility] CM/SW Contact  Doneta Glenys DASEN, RN Phone Number: 06/13/2024, 8:19 AM  Clinical Narrative:    CM called patients daughter Bruno [phone]. Left message to call back within an 1 hours or CM will request a Welfare Check by [employer]. NO bed offers. Several SNF are considering. 9:24 AM CM called in a Welfare check for patient's daughter Bruno. Requested with [employer] operator # [phone]. 11:39 AM [employer] - Officer Karleen and Behavior Health Crisis Counselor Tana Bamberger made contact with patients daughter [name] in the home. They reported that Bruno was polite, and not demonstrating any signs of suicidal ideations. They instructed [name] to call CM. 12:23 PM Insurance shara stared for [employer] Auth ID [id number]  Expected Discharge Plan: Long Term Nursing Home Barriers to Discharge: Family Issues, SNF Pending bed offer               Expected Discharge Plan and Services In-house Referral: Clinical Social Work Discharge Planning Services: CM Consult   Living arrangements for the past 2 months: Apartment                 DME Arranged: N/A DME Agency: NA       HH Arranged: NA HH Agency: NA         Social Drivers of Health (SDOH) Interventions SDOH Screenings   Food Insecurity: Food Insecurity Present (06/12/2024)  Housing: Patient Unable To Answer (06/12/2024)  Transportation Needs: Patient Unable To Answer (06/12/2024)  Utilities: Patient Unable To Answer (06/12/2024)  Depression (PHQ2-9): Low Risk  (05/01/2024)  Social Connections: Patient Unable To Answer (06/12/2024)  Tobacco Use: Medium Risk (06/10/2024)    Readmission Risk Interventions    08/18/2023    2:28 PM  Readmission Risk Prevention Plan  Post Dischage Appt Complete  Medication Screening Complete  Transportation Screening Complete

## 2024-06-13 NOTE — Progress Notes (Signed)
 Progress Note    BRIENA SWINGLER   FMW:[id number]  DOB: [birth year]  DOA: 06/10/2024     0 PCP: Caro [name] POUR, NP  Initial CC: confusion, paranoia   Hospital Course: Ms. Rothbauer is an 83 yo female with PMH CKD stage IIIb, osteoarthritis, TBI, GERD, OSA, hypothyroidism, prior CVA, paroxysmal A-fib, CHF, hypertension, HLD, coronary artery disease, CABG, carotid artery stenosis, anxiety, depression, dementia who was brought from home by the daughter for evaluation of increased confusion, paranoia.  She was recently here at [facility], ED for the same and was seen by psychiatry and was discharged home.  She was treated for psychosis at that time.  Family were trying to pursue outpatient placement but unsuccessful.  She has been noticed to have increased paranoia, psychosis, hostile at home.  On prednisone , she was hemodynamically stable.  UDS was negative.  UA showed moderate leukocytes, rare bacteria.  UTI suspected.  CT abdomen/pelvis showed cystitis, constipation, numerous lytic areas scattered throughout the pelvis and bilateral femur.  Psychiatry consulted.  Started on antibiotic for possible UTI. Of note, CT head also shows generalized cerebral atrophy with chronic bilateral frontal lobe infarcts and notable amount of encephalomalacia and volume loss of bilateral anterior frontal lobes.    Assessment and plan:   Confusion, memory loss, paranoia Acute metabolic encephalopathy - hx psychosis/paranoia but given description of also slow memory and behavior changes the suspicion for dementia is increased; furthermore, s/p CVA in May 2025 with B/L frontal lobe infarcts and now with progressive encephalomalacia and volume loss of both anterior frontal lobes also places her at higher predisposition for vascular dementia due to this - Minimal improvement on Rocephin  for presumed UTI also further supports probable underlying dementia with behavior changes; agree with psychiatry assessment -  Continue Seroquel , BuSpar , Xanax  per psychiatry  Acute cystitis - UA noted with moderate LE, negative nitrite, greater than 50 WBC - Urine culture growing 60,000 k E. Coli; will plan on completing 3-day course of Rocephin ; completed 06/13/2024   History of anxiety/depression/dementia - see above    Constipation - Continue MiraLAX , Senokot.  Abdomen is soft and nondistended   Lytic bone lesions - Incidental finding.  CT imaging showed scattered lytic lesions throughout the pelvis and bilateral femur - NM bone scan negative but not sufficient in itself for MM diagnosis - does not have other CRAB criteria, so suspicion for MM still low - Hold off on further workup at this time  Hypothyroidism - On Synthroid  150 mcg at home prior to admission - TSH elevated at 25.1; free T4 0.72 - Suspect forgetfulness of taking medication and/or contribution from paranoia - Will increase some but needs repeat TSH and free T4 in 4 to 6 weeks   Hypertension - continue metoprolol    History of chronic combined systolic/diastolic CHF - Currently appears euvolemic - Continue metoprolol .  Last echo done on 12/2023 showed EF of 40 to 45%, grade 2 diastolic dysfunction   History of paroxysmal A-fib - Currently in normal sinus rhythm.  Continue metoprolol    History of CVA CAD Carotid artery disease - On aspirin  and Plavix  which will be continued   History of hyperlipidemia - On Zetia , rosuvastatin    CKD stage III - Currently kidney function at baseline  Interval History:  No events overnight.  Still paranoid this morning.  Even afraid that someone is going to find her in the hospital and harm her.   Antimicrobials: Rocephin  06/11/2024 >> 06/13/2024  DVT prophylaxis:  heparin  injection 5,000  Units Start: 06/11/24 1000   Code Status:   Code Status: Full Code  Mobility Assessment (Last 72 Hours)     Mobility Assessment     Row Name 06/13/24 0104 06/12/24 1626 06/12/24 0822 06/11/24 2300  06/11/24 1632   Does the patient have exclusion criteria? No - Perform mobility assessment -- No - Perform mobility assessment No - Perform mobility assessment --   What is the highest level of mobility based on the mobility assessment? Level 4 (Ambulates with assistance) - Balance while stepping forward/back - Complete Level 4 (Ambulates with assistance) - Balance while stepping forward/back - Complete Level 5 (Ambulates independently) - Balance while walking independently - Complete Level 4 (Ambulates with assistance) - Balance while stepping forward/back - Complete Level 4 (Ambulates with assistance) - Balance while stepping forward/back - Complete    Row Name 06/11/24 0818           Does the patient have exclusion criteria? No - Perform mobility assessment       What is the highest level of mobility based on the mobility assessment? Level 5 (Ambulates independently) - Balance while walking independently - Complete          Diet: Diet Orders (From admission, onward)     Start     Ordered   06/11/24 0718  Diet Heart Room service appropriate? Yes; Fluid consistency: Thin  Diet effective now       Question Answer Comment  Room service appropriate? Yes   Fluid consistency: Thin      06/11/24 0720            Barriers to discharge:  Disposition Plan: TBD HH orders placed: SNF Status is: Observation  Objective: Blood pressure (!) 115/95, pulse 79, temperature 97.6 F (36.4 C), resp. rate 20, height 5' 1 (1.549 m), weight 75.6 kg, SpO2 94%.  Examination:  Physical Exam Constitutional:      Appearance: Normal appearance.  HENT:     Head: Normocephalic and atraumatic.     Mouth/Throat:     Mouth: Mucous membranes are moist.  Eyes:     Extraocular Movements: Extraocular movements intact.  Cardiovascular:     Rate and Rhythm: Normal rate and regular rhythm.  Pulmonary:     Effort: Pulmonary effort is normal. No respiratory distress.     Breath sounds: Normal breath  sounds. No wheezing.  Abdominal:     General: Bowel sounds are normal. There is no distension.     Palpations: Abdomen is soft.     Tenderness: There is no abdominal tenderness.  Musculoskeletal:        General: Normal range of motion.     Cervical back: Normal range of motion and neck supple.  Skin:    General: Skin is warm and dry.  Neurological:     Mental Status: She is alert. She is disoriented.  Psychiatric:        Mood and Affect: Mood is anxious.        Thought Content: Thought content is paranoid.      Consultants:  Psychiatry  Procedures:    Data Reviewed: Results for orders placed or performed during the hospital encounter of 06/10/24 (from the past 24 hours)  Basic metabolic panel with GFR     Status: Abnormal   Collection Time: 06/13/24  5:00 AM  Result Value Ref Range   Sodium 140 135 - 145 mmol/L   Potassium 4.9 3.5 - 5.1 mmol/L   Chloride 102 98 - 111  mmol/L   CO2 30 22 - 32 mmol/L   Glucose, Bld 94 70 - 99 mg/dL   BUN 20 8 - 23 mg/dL   Creatinine, Ser 8.38 (H) 0.44 - 1.00 mg/dL   Calcium  9.4 8.9 - 10.3 mg/dL   GFR, Estimated 32 (L) >60 mL/min   Anion gap 8 5 - 15  Magnesium      Status: None   Collection Time: 06/13/24  5:00 AM  Result Value Ref Range   Magnesium  2.3 1.7 - 2.4 mg/dL    I have reviewed pertinent nursing notes, vitals, labs, and images as necessary. I have ordered labwork to follow up on as indicated.  I have reviewed the last notes from staff over past 24 hours. I have discussed patient's care plan and test results with nursing staff, CM/SW, and other staff as appropriate.  Old records reviewed in assessment of this patient  Time spent: Greater than 50% of the 55 minute visit was spent in counseling/coordination of care for the patient as laid out in the A&P.   LOS: 0 days   Alm Apo, MD [facility] 06/13/2024, 1:27 PM

## 2024-06-13 NOTE — Progress Notes (Signed)
 Physical Therapy Treatment Patient Details Name: Deanna [name] MRN: [id number] DOB: 25-Nov-1940 Today's Date: 06/13/2024   History of Present Illness ANNABETH TORTORA is a 83 y.o. female admitted11/3/25 for AMS, behavioral changes including worsening confusion and paranoia. PMH:R frontal white matter small infarct, TBI, dementia, paroxysmal atrial fibrillation, PAD,  right carotid artery endarterectomy and angioplasty, hypothyroidism, CKD, CAD s/p stent, anxiety and depression.    PT Comments  Pt reports she's having severe tail bone pain, pain meds requested. Pt required mod assist to roll, min assist for sidelying to sit. Min assist to stand. Pt c/o tail bone pain in standing and stated she couldn't walk, but agreed to take a few steps at edge of bed and then to recliner with RW. Pt performed seated BUE/LE strengthening exercises. Pain limiting activity tolerance.    If plan is discharge home, recommend the following: A little help with walking and/or transfers;A little help with bathing/dressing/bathroom;Help with stairs or ramp for entrance;Assist for transportation   Can travel by private vehicle     No  Equipment Recommendations  None recommended by PT    Recommendations for Other Services       Precautions / Restrictions Precautions Precautions: Fall Recall of Precautions/Restrictions: Impaired Restrictions Weight Bearing Restrictions Per Provider Order: No     Mobility  Bed Mobility Overal bed mobility: Needs Assistance Bed Mobility: Rolling, Sidelying to Sit Rolling: Mod assist Sidelying to sit: Min assist       General bed mobility comments: assist to raise trunk and to pivot hips    Transfers Overall transfer level: Needs assistance Equipment used: Rolling walker (2 wheels) Transfers: Sit to/from Stand, Bed to chair/wheelchair/BSC Sit to Stand: Min assist           General transfer comment: STS x 4, min A to power up, min A for balance during pivot to  recliner, standing tolerance limited by tail bone pain, pain meds requesting    Ambulation/Gait               General Gait Details: pt declined ambulation 2* tail bone pain   Stairs             Wheelchair Mobility     Tilt Bed    Modified Rankin (Stroke Patients Only)       Balance Overall balance assessment: Needs assistance Sitting-balance support: Bilateral upper extremity supported, Feet supported Sitting balance-Leahy Scale: Fair     Standing balance support: Reliant on assistive device for balance, During functional activity, Bilateral upper extremity supported Standing balance-Leahy Scale: Poor                              Communication Communication Communication: No apparent difficulties  Cognition Arousal: Alert Behavior During Therapy: WFL for tasks assessed/performed   PT - Cognitive impairments: History of cognitive impairments, No family/caregiver present to determine baseline, Memory                       PT - Cognition Comments: oriented to self, to location, and to month; vague historian Following commands: Intact      Cueing Cueing Techniques: Verbal cues, Gestural cues, Tactile cues  Exercises General Exercises - Upper Extremity Shoulder Flexion: AROM, Both, 10 reps, Seated General Exercises - Lower Extremity Ankle Circles/Pumps: AROM, Both, 10 reps, Seated Long Arc Quad: AROM, Both, 10 reps, Seated Hip Flexion/Marching: AROM, Both, 10 reps, Seated    General  Comments        Pertinent Vitals/Pain Pain Assessment Faces Pain Scale: Hurts whole lot Breathing: occasional labored breathing, short period of hyperventilation Negative Vocalization: occasional moan/groan, low speech, negative/disapproving quality Facial Expression: sad, frightened, frown Body Language: tense, distressed pacing, fidgeting Consolability: distracted or reassured by voice/touch PAINAD Score: 5 Pain Location: tail bone Pain  Descriptors / Indicators: Moaning, Grimacing Pain Intervention(s): Limited activity within patient's tolerance, Monitored during session, Patient requesting pain meds-RN notified, Repositioned    Home Living                          Prior Function            PT Goals (current goals can now be found in the care plan section) Acute Rehab PT Goals PT Goal Formulation: Patient unable to participate in goal setting Time For Goal Achievement: 06/25/24 Potential to Achieve Goals: Fair Progress towards PT goals: Progressing toward goals    Frequency    Min 2X/week      PT Plan      Co-evaluation              AM-PAC PT 6 Clicks Mobility   Outcome Measure  Help needed turning from your back to your side while in a flat bed without using bedrails?: A Lot Help needed moving from lying on your back to sitting on the side of a flat bed without using bedrails?: A Little Help needed moving to and from a bed to a chair (including a wheelchair)?: A Little Help needed standing up from a chair using your arms (e.g., wheelchair or bedside chair)?: A Little Help needed to walk in hospital room?: A Lot Help needed climbing 3-5 steps with a railing? : Total 6 Click Score: 14    End of Session Equipment Utilized During Treatment: Gait belt Activity Tolerance: Patient limited by pain Patient left: in chair;with call bell/phone within reach;with chair alarm set;with nursing/sitter in room Nurse Communication: Mobility status PT Visit Diagnosis: Unsteadiness on feet (R26.81);Other abnormalities of gait and mobility (R26.89);Difficulty in walking, not elsewhere classified (R26.2);Pain     Time: 8543-8485 PT Time Calculation (min) (ACUTE ONLY): 18 min  Charges:    $Therapeutic Activity: 8-22 mins PT General Charges $$ ACUTE PT VISIT: 1 Visit                     Sylvan Delon Copp PT 06/13/2024  Acute Rehabilitation Services  Office [phone]

## 2024-06-13 NOTE — Progress Notes (Signed)
 Pt has c/o excruciating pain in the lower back that is uncontrolled with Oxy 5mg  PO q6h . Charge Nurse Melvyn Aware . Jesus NP paged for further pain management. SABRA SABRA

## 2024-06-13 NOTE — Plan of Care (Signed)
   Problem: Education: Goal: Knowledge of General Education information will improve Description Including pain rating scale, medication(s)/side effects and non-pharmacologic comfort measures Outcome: Progressing   Problem: Health Behavior/Discharge Planning: Goal: Ability to manage health-related needs will improve Outcome: Progressing

## 2024-06-14 DIAGNOSIS — F03918 Unspecified dementia, unspecified severity, with other behavioral disturbance: Secondary | ICD-10-CM | POA: Diagnosis not present

## 2024-06-14 DIAGNOSIS — N3000 Acute cystitis without hematuria: Secondary | ICD-10-CM | POA: Diagnosis not present

## 2024-06-14 MED ORDER — ALPRAZOLAM 0.5 MG PO TABS: 0.5000 mg | ORAL_TABLET | Freq: Every day | ORAL | 0 refills | Status: DC | PRN

## 2024-06-14 MED ORDER — LEVOTHYROXINE SODIUM 175 MCG PO TABS: 175.0000 ug | ORAL_TABLET | Freq: Every day | ORAL | Status: AC

## 2024-06-14 MED ORDER — ACETAMINOPHEN 325 MG PO TABS: 650.0000 mg | ORAL_TABLET | ORAL | Status: AC | PRN

## 2024-06-14 NOTE — Progress Notes (Signed)
 Pt discharged to [facility] center. Left unit on stretched pushed by [facility] staff. Left in stable condition.

## 2024-06-14 NOTE — Discharge Summary (Signed)
 Physician Discharge Summary   VIDYA BAMFORD FMW:[id number] DOB: [birth year] DOA: 06/10/2024  PCP: Caro [name] POUR, NP  Admit date: 06/10/2024 Discharge date: 06/14/2024  Admitted From: Home Disposition:  SNF Discharging physician: Alm Apo, MD Barriers to discharge: none  Recommendations at discharge: Consider LTC Repeat TSH and FT4 in 4-6 weeks  Discharge Condition: stable CODE STATUS: Full  Diet recommendation:  Diet Orders (From admission, onward)     Start     Ordered   06/14/24 0000  Diet general        06/14/24 1157   06/11/24 0718  Diet Heart Room service appropriate? Yes; Fluid consistency: Thin  Diet effective now       Question Answer Comment  Room service appropriate? Yes   Fluid consistency: Thin      06/11/24 0720            Hospital Course: Ms. Massengale is an 83 yo female with PMH CKD stage IIIb, osteoarthritis, TBI, GERD, OSA, hypothyroidism, prior CVA, paroxysmal A-fib, CHF, hypertension, HLD, coronary artery disease, CABG, carotid artery stenosis, anxiety, depression, dementia who was brought from home by the daughter for evaluation of increased confusion, paranoia.  She was recently here at [facility], ED for the same and was seen by psychiatry and was discharged home.  She was treated for psychosis at that time.  Family were trying to pursue outpatient placement but unsuccessful.  She has been noticed to have increased paranoia, psychosis, hostile at home.  On prednisone , she was hemodynamically stable.  UDS was negative.  UA showed moderate leukocytes, rare bacteria.  UTI suspected.  CT abdomen/pelvis showed cystitis, constipation, numerous lytic areas scattered throughout the pelvis and bilateral femur.  Psychiatry consulted.  Started on antibiotic for possible UTI. Of note, CT head also shows generalized cerebral atrophy with chronic bilateral frontal lobe infarcts and notable amount of encephalomalacia and volume loss of bilateral anterior frontal  lobes.    Assessment and plan:   Confusion, memory loss, paranoia Acute metabolic encephalopathy - hx psychosis/paranoia but given description of also slow memory and behavior changes the suspicion for dementia is increased; furthermore, s/p CVA in May 2025 with B/L frontal lobe infarcts and now with progressive encephalomalacia and volume loss of both anterior frontal lobes also places her at higher predisposition for vascular dementia due to this - Minimal improvement on Rocephin  for presumed UTI also further supports probable underlying dementia with behavior changes; agree with psychiatry assessment - Continue Seroquel , BuSpar , Xanax  per psychiatry  Acute cystitis - UA noted with moderate LE, negative nitrite, greater than 50 WBC - Urine culture growing 60,000 k E. Coli; s/p 3-day course of Rocephin ; completed 06/13/2024   History of anxiety/depression/dementia - see above    Constipation - Continue MiraLAX , Senokot.  Abdomen is soft and nondistended   Lytic bone lesions - Incidental finding.  CT imaging showed scattered lytic lesions throughout the pelvis and bilateral femur - NM bone scan negative but not sufficient in itself for MM diagnosis - does not have other CRAB criteria, so suspicion for MM still low - Hold off on further workup at this time  Hypothyroidism - On Synthroid  150 mcg at home prior to admission - TSH elevated at 25.1; free T4 0.72 - Suspect forgetfulness of taking medication and/or contribution from paranoia - Will increase some but needs repeat TSH and free T4 in 4 to 6 weeks   Hypertension - continue metoprolol    History of chronic combined systolic/diastolic CHF - Currently appears  euvolemic - Continue metoprolol .  Last echo done on 12/2023 showed EF of 40 to 45%, grade 2 diastolic dysfunction   History of paroxysmal A-fib - Currently in normal sinus rhythm.  Continue metoprolol    History of CVA CAD Carotid artery disease - On aspirin  and  Plavix  which will be continued   History of hyperlipidemia - On Zetia , rosuvastatin    CKD stage III - Currently kidney function at baseline   Principal Diagnosis: Acute cystitis  Discharge Diagnoses: Active Hospital Problems   Diagnosis Date Noted   Dementia with behavioral disturbance (HCC) 04/28/2024    Priority: 1.   History of CVA (cerebrovascular accident) 06/12/2024    Priority: 2.   Paranoia (HCC) 06/11/2024    Priority: 2.   Hypothyroidism 11/17/2008    Priority: 3.   Constipation 06/11/2024   Lytic bone lesions on xray 06/11/2024   Generalized anxiety disorder 06/17/2018   History of traumatic brain injury 11/04/2015   Hyperlipidemia 07/02/2014   Essential hypertension 11/17/2008   GERD 11/17/2008    Resolved Hospital Problems   Diagnosis Date Noted Date Resolved   Acute cystitis 06/10/2024 06/12/2024    Priority: 2.   Acute metabolic encephalopathy 06/11/2024 06/12/2024    Priority: 1.     Discharge Instructions     Diet general   Complete by: As directed    Increase activity slowly   Complete by: As directed       Allergies as of 06/14/2024       Reactions   Lidocaine  Swelling, Other (See Comments)   Use CORRECT dose for age/weight- red welts and tongue swelling result, if not   Cymbalta  [duloxetine  Hcl] Other (See Comments)   Increased confusion and memory concerns    Donepezil Other (See Comments)   Altered mood and Aggression    Mobic [meloxicam] Other (See Comments)   Heart failure- cannot take   Namenda [memantine] Other (See Comments)   Severe aggression   Nsaids Other (See Comments)   Cannot have due to heart issues   Other Other (See Comments)   No salt or anything that might cause fluid retention/swelling!!   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 What changed:  medication strength how much to take when to take this reasons to take this   albuterol  108 (90 Base) MCG/ACT inhaler Commonly known as: VENTOLIN  HFA Inhale 2 puffs into the lungs every 6 (six) hours as needed for wheezing or shortness of breath.   ALPRAZolam  0.5 MG tablet Commonly known as: Xanax  Take 1 tablet (0.5 mg total) by mouth daily as needed for anxiety.   aspirin  EC 81 MG tablet Take 81 mg by mouth at bedtime.   busPIRone  15 MG tablet Commonly known as: BUSPAR  Take 1 tablet (15 mg total) by mouth 2 (two) times daily.   clopidogrel  75 MG tablet Commonly known as: PLAVIX  Take 75 mg by  mouth daily.   divalproex  250 MG 24 hr tablet Commonly known as: DEPAKOTE  ER Take 1 tablet (250 mg total) by mouth at bedtime.   dorzolamide  2 % ophthalmic solution Commonly known as: TRUSOPT  Place 1 drop into both eyes in the morning and at bedtime.   escitalopram  20 MG tablet Commonly known as: LEXAPRO  Take 1 tablet (20 mg total) by mouth daily. What changed: when to take this   ezetimibe  10 MG tablet Commonly known as: ZETIA  TAKE 1 TABLET(10 MG) BY MOUTH DAILY What changed: See the new instructions.   furosemide  40 MG tablet Commonly known as: LASIX  Take 1 tablet (40 mg total) by mouth daily as needed for fluid or edema.   gabapentin  100 MG capsule Commonly known as: NEURONTIN  Take 200 mg by mouth daily at 12 noon.   latanoprost  0.005 % ophthalmic solution Commonly known as: XALATAN  Place 1 drop into both eyes at bedtime.   levothyroxine  175 MCG tablet Commonly known as:  SYNTHROID  Take 1 tablet (175 mcg total) by mouth daily at 6 (six) AM. Start taking on: June 15, 2024 What changed:  medication strength how much to take   metoprolol  succinate 25 MG 24 hr tablet Commonly known as: TOPROL -XL Take 1 tablet (25 mg total) by mouth daily. What changed: when to take this   nitroGLYCERIN  0.4 MG SL tablet Commonly known as: NITROSTAT  Place 0.4 mg under the tongue every 5 (five) minutes as needed for chest pain.   pantoprazole  40 MG tablet Commonly known as: PROTONIX  Take 1 tablet (40 mg total) by mouth daily. What changed: when to take this   QUEtiapine  25 MG tablet Commonly known as: SEROQUEL  Take 1 tablet (25 mg total) by mouth at bedtime. Would wean as tolerated   rosuvastatin  40 MG tablet Commonly known as: CRESTOR  Take 40 mg by mouth daily.        Contact information for after-discharge care     Destination     [facility] .   Service: Skilled Nursing Contact information: [address] [place]  [address] [phone]                    Allergies  Allergen Reactions   Lidocaine  Swelling and Other (See Comments)    Use CORRECT dose for age/weight- red welts and tongue swelling result, if not   Cymbalta  [Duloxetine  Hcl] Other (See Comments)    Increased confusion and memory concerns    Donepezil Other (See Comments)    Altered mood and Aggression     Mobic [Meloxicam] Other (See Comments)    Heart failure- cannot take   Namenda [Memantine] Other (See Comments)    Severe aggression   Nsaids Other (See Comments)    Cannot have due to heart issues   Other Other (See Comments)    No salt or anything that might cause fluid retention/swelling!!   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  HENT:     Head:  Normocephalic and atraumatic.     Mouth/Throat:     Mouth: Mucous membranes are moist.  Eyes:     Extraocular Movements: Extraocular movements intact.  Cardiovascular:     Rate and Rhythm: Normal rate and regular rhythm.  Pulmonary:     Effort: Pulmonary effort is normal. No respiratory distress.     Breath sounds: Normal breath sounds. No wheezing.  Abdominal:     General: Bowel sounds are normal. There is no distension.     Palpations: Abdomen is soft.     Tenderness: There is no abdominal tenderness.  Musculoskeletal:        General: Normal range of motion.     Cervical back: Normal range of motion and neck supple.  Skin:    General: Skin is warm and dry.  Neurological:     Mental Status: She is alert. She is disoriented.  Psychiatric:        Mood and Affect: Mood is anxious.        Thought Content: Thought content is paranoid.      The results of significant diagnostics from this hospitalization (including imaging, microbiology, ancillary and laboratory) are listed below for reference.   Microbiology: Recent Results (from the past 240 hours)  Urine Culture     Status: Abnormal   Collection Time: 06/10/24  5:51 PM   Specimen: Urine, Random  Result Value Ref Range Status   Specimen Description   Final    URINE, RANDOM Performed at [facility], [address] [place], [place] [address]    Special Requests   Final    NONE Reflexed from [id number] Performed at [facility], [address], [place], [place] [address]    Culture 60,000 COLONIES/mL ESCHERICHIA COLI (A)  Final   Report Status 06/13/2024 FINAL  Final   Organism ID, Bacteria ESCHERICHIA COLI (A)  Final      Susceptibility    Escherichia coli - MIC*    AMPICILLIN <=2 SENSITIVE Sensitive     CEFAZOLIN  (URINE) Value in next row Sensitive      <=1 SENSITIVEThis is a modified FDA-approved test that has been validated and its performance characteristics determined by the reporting laboratory.  This laboratory is certified under the Clinical Laboratory Improvement Amendments CLIA as qualified to perform high complexity clinical laboratory testing.    CEFEPIME Value in next row Sensitive      <=1 SENSITIVEThis is a modified FDA-approved test that has been validated and its performance characteristics determined by the reporting laboratory.  This laboratory is certified under the Clinical Laboratory Improvement Amendments CLIA as qualified to perform high complexity clinical laboratory testing.    ERTAPENEM Value in next row Sensitive      <=1 SENSITIVEThis is a modified FDA-approved test that has been validated and its performance characteristics determined by the reporting laboratory.  This laboratory is certified under the Clinical Laboratory Improvement Amendments CLIA as qualified to perform high complexity clinical laboratory testing.    CEFTRIAXONE  Value in next row Sensitive      <=1 SENSITIVEThis is a modified FDA-approved test that has been validated and its performance characteristics determined by the reporting laboratory.  This laboratory is certified under the Clinical Laboratory Improvement Amendments CLIA as qualified to perform high complexity clinical laboratory testing.    CIPROFLOXACIN  Value in next row Sensitive      <=1 SENSITIVEThis is a modified FDA-approved test that has been validated and its performance characteristics determined by the reporting laboratory.  This laboratory is certified under the Clinical Laboratory Improvement Amendments CLIA as qualified to perform high complexity clinical laboratory testing.    GENTAMICIN Value in next row Sensitive      <=1 SENSITIVEThis is a modified FDA-approved  test that has been validated and its performance characteristics determined by the reporting laboratory.  This laboratory is certified under the Clinical Laboratory Improvement Amendments CLIA as qualified to perform high complexity clinical laboratory testing.    NITROFURANTOIN Value in next row Sensitive      <=1 SENSITIVEThis is a modified FDA-approved test that has been validated and its performance characteristics determined by the reporting laboratory.  This laboratory is certified under the Clinical Laboratory Improvement Amendments CLIA as qualified to perform high complexity clinical laboratory testing.    TRIMETH /SULFA  Value in next row Sensitive      <=1 SENSITIVEThis is a modified FDA-approved test that has been validated and its performance characteristics determined by the reporting laboratory.  This laboratory is certified under the Clinical Laboratory Improvement Amendments CLIA as qualified to perform high complexity clinical laboratory testing.    AMPICILLIN/SULBACTAM Value in next row Sensitive      <=1 SENSITIVEThis is a modified FDA-approved test that has been validated and its performance characteristics determined by the reporting laboratory.  This laboratory is certified under the Clinical Laboratory Improvement Amendments CLIA as qualified to perform high complexity clinical laboratory testing.    PIP/TAZO Value in next row Sensitive      <=4 SENSITIVEThis is a modified FDA-approved test that has been validated and its performance characteristics determined by the reporting laboratory.  This laboratory is certified under the Clinical Laboratory Improvement Amendments CLIA as qualified to perform high complexity clinical laboratory testing.    MEROPENEM Value in next row Sensitive      <=4 SENSITIVEThis is a modified FDA-approved test that has been validated and its performance characteristics determined by the reporting laboratory.  This laboratory is certified under the Clinical  Laboratory Improvement Amendments CLIA as qualified to perform high complexity clinical laboratory testing.    * 60,000 COLONIES/mL ESCHERICHIA COLI     Labs: BNP (last 3 results) Recent Labs    12/16/23 0648  BNP 1,006.4*   Basic Metabolic Panel: Recent Labs  Lab 06/10/24 1748 06/11/24 0742 06/12/24 0442 06/13/24 0500  NA 140 142 139 140  K 4.4 4.3 4.6 4.9  CL 104 104 104 102  CO2 27 29 27 30   GLUCOSE 107* 96 100* 94  BUN 20 18 21 20   CREATININE 1.48* 1.46* 1.70* 1.61*  CALCIUM  9.5 9.3 8.8* 9.4  MG  --  2.2  --  2.3   Liver Function Tests: Recent Labs  Lab 06/10/24 1748  AST 19  ALT 16  ALKPHOS 95  BILITOT 1.0  PROT 6.6  ALBUMIN  3.8   No results for input(s): LIPASE, AMYLASE in the last 168 hours. Recent Labs  Lab 06/10/24 1747  AMMONIA 23   CBC: Recent Labs  Lab 06/10/24 1748 06/11/24 0742 06/12/24 0442  WBC 8.3 6.3 5.3  NEUTROABS 6.6  --   --   HGB 12.9 12.3 11.2*  HCT 41.0 39.0 37.9  MCV 88.6 90.5 91.1  PLT 211 175 179   Cardiac Enzymes: No results for input(s): CKTOTAL, CKMB, CKMBINDEX, TROPONINI in the last 168 hours. BNP: Invalid input(s): POCBNP CBG: No results for input(s): GLUCAP in the last 168 hours. D-Dimer No results for input(s): DDIMER in the last 72 hours. Hgb A1c No results for  input(s): HGBA1C in the last 72 hours. Lipid Profile No results for input(s): CHOL, HDL, LDLCALC, TRIG, CHOLHDL, LDLDIRECT in the last 72 hours. Thyroid  function studies No results for input(s): TSH, T4TOTAL, T3FREE, THYROIDAB in the last 72 hours.  Invalid input(s): FREET3 Anemia work up No results for input(s): VITAMINB12, FOLATE, FERRITIN, TIBC, IRON, RETICCTPCT in the last 72 hours. Urinalysis    Component Value Date/Time   COLORURINE STRAW (A) 06/10/2024 1751   APPEARANCEUR HAZY (A) 06/10/2024 1751   LABSPEC 1.006 06/10/2024 1751   PHURINE 7.0 06/10/2024 1751   GLUCOSEU NEGATIVE  06/10/2024 1751   HGBUR SMALL (A) 06/10/2024 1751   BILIRUBINUR NEGATIVE 06/10/2024 1751   BILIRUBINUR Negative 05/01/2024 1443   KETONESUR NEGATIVE 06/10/2024 1751   PROTEINUR NEGATIVE 06/10/2024 1751   UROBILINOGEN negative 05/01/2024 1443   UROBILINOGEN 0.2 04/26/2015 0811   NITRITE NEGATIVE 06/10/2024 1751   LEUKOCYTESUR MODERATE (A) 06/10/2024 1751   Sepsis Labs Recent Labs  Lab 06/10/24 1748 06/11/24 0742 06/12/24 0442  WBC 8.3 6.3 5.3   Microbiology Recent Results (from the past 240 hours)  Urine Culture     Status: Abnormal   Collection Time: 06/10/24  5:51 PM   Specimen: Urine, Random  Result Value Ref Range Status   Specimen Description   Final    URINE, RANDOM Performed at [facility], [address] [place], [place] [address]    Special Requests   Final    NONE Reflexed from [id number] Performed at [facility], [address] [place], [place] [address]    Culture 60,000 COLONIES/mL ESCHERICHIA COLI (A)  Final   Report Status 06/13/2024 FINAL  Final   Organism ID, Bacteria ESCHERICHIA COLI (A)  Final      Susceptibility   Escherichia coli - MIC*    AMPICILLIN <=2 SENSITIVE Sensitive     CEFAZOLIN  (URINE) Value in next row Sensitive      <=1 SENSITIVEThis is a modified FDA-approved test that has been validated and its performance characteristics determined by the reporting laboratory.  This laboratory is certified under the Clinical Laboratory Improvement Amendments CLIA as qualified to perform high complexity clinical laboratory testing.    CEFEPIME Value in next row Sensitive      <=1 SENSITIVEThis is a modified FDA-approved test that has been validated and its performance characteristics determined by the reporting laboratory.  This laboratory is certified under the Clinical Laboratory Improvement Amendments CLIA as qualified to perform high complexity clinical laboratory testing.    ERTAPENEM Value in next row Sensitive       <=1 SENSITIVEThis is a modified FDA-approved test that has been validated and its performance characteristics determined by the reporting laboratory.  This laboratory is certified under the Clinical Laboratory Improvement Amendments CLIA as qualified to perform high complexity clinical laboratory testing.    CEFTRIAXONE  Value in next row Sensitive      <=1 SENSITIVEThis is a modified FDA-approved test that has been validated and its performance characteristics determined by the reporting laboratory.  This laboratory is certified under the Clinical Laboratory Improvement Amendments CLIA as qualified to perform high complexity clinical laboratory testing.    CIPROFLOXACIN  Value in next row Sensitive      <=1 SENSITIVEThis is a modified FDA-approved test that has been validated and its performance characteristics determined by the reporting laboratory.  This laboratory is certified under the Clinical Laboratory Improvement Amendments CLIA as qualified to perform high complexity clinical laboratory testing.    GENTAMICIN Value in  next row Sensitive      <=1 SENSITIVEThis is a modified FDA-approved test that has been validated and its performance characteristics determined by the reporting laboratory.  This laboratory is certified under the Clinical Laboratory Improvement Amendments CLIA as qualified to perform high complexity clinical laboratory testing.    NITROFURANTOIN Value in next row Sensitive      <=1 SENSITIVEThis is a modified FDA-approved test that has been validated and its performance characteristics determined by the reporting laboratory.  This laboratory is certified under the Clinical Laboratory Improvement Amendments CLIA as qualified to perform high complexity clinical laboratory testing.    TRIMETH /SULFA  Value in next row Sensitive      <=1 SENSITIVEThis is a modified FDA-approved test that has been validated and its performance characteristics determined by the reporting laboratory.   This laboratory is certified under the Clinical Laboratory Improvement Amendments CLIA as qualified to perform high complexity clinical laboratory testing.    AMPICILLIN/SULBACTAM Value in next row Sensitive      <=1 SENSITIVEThis is a modified FDA-approved test that has been validated and its performance characteristics determined by the reporting laboratory.  This laboratory is certified under the Clinical Laboratory Improvement Amendments CLIA as qualified to perform high complexity clinical laboratory testing.    PIP/TAZO Value in next row Sensitive      <=4 SENSITIVEThis is a modified FDA-approved test that has been validated and its performance characteristics determined by the reporting laboratory.  This laboratory is certified under the Clinical Laboratory Improvement Amendments CLIA as qualified to perform high complexity clinical laboratory testing.    MEROPENEM Value in next row Sensitive      <=4 SENSITIVEThis is a modified FDA-approved test that has been validated and its performance characteristics determined by the reporting laboratory.  This laboratory is certified under the Clinical Laboratory Improvement Amendments CLIA as qualified to perform high complexity clinical laboratory testing.    * 60,000 COLONIES/mL ESCHERICHIA COLI    Procedures/Studies: NM Bone Scan Whole Body Result Date: 06/11/2024 CLINICAL DATA:  Lytic osseous lesion, questionable multiple myeloma. EXAM: NUCLEAR MEDICINE WHOLE BODY BONE SCAN TECHNIQUE: Whole body anterior and posterior images were obtained approximately 3 hours after intravenous injection of radiopharmaceutical. RADIOPHARMACEUTICALS:  22 mCi Technetium-66m MDP IV COMPARISON:  CT pelvis June 10, 2024 FINDINGS: Multiple foci of uptake throughout the spine and bilateral shoulder joints in a pattern suggestive of degenerative changes. No suspicious tracer activity to suggest multiple myeloma. Lack of tracer activity within the right kidney suggestive of  compromised renal function. Status post left knee arthroplasty without suspicious tracer uptake around the prosthesis. Degenerative changes of right knee joint. IMPRESSION: Degenerative changes of the spine and bilateral shoulder joints. Left knee arthroplasty. No suspicious findings to suggest multiple myeloma. Please note myeloma lesions may appear photopenic due to lack of osteoblastic activity on bone scan. Therefore bone scans usually do not contribute significant information to the workup of patients with multiple myeloma. Electronically Signed   By: Megan  Zare M.D.   On: 06/11/2024 15:02   CT PELVIS WO CONTRAST Result Date: 06/10/2024 CLINICAL DATA:  Pelvis pain. EXAM: CT PELVIS WITHOUT CONTRAST TECHNIQUE: Multidetector CT imaging of the pelvis was performed following the standard protocol without intravenous contrast. RADIATION DOSE REDUCTION: This exam was performed according to the departmental dose-optimization program which includes automated exposure control, adjustment of the mA and/or kV according to patient size and/or use of iterative reconstruction technique. COMPARISON:  March 21, 2020 FINDINGS: Urinary Tract: There is moderate to marked  severity diffuse urinary bladder wall thickening. A mild amount of surrounding inflammatory fat stranding is seen. Bowel: There is no evidence of bowel dilatation. Noninflamed diverticula are noted throughout the sigmoid colon. A large amount of retained stool is seen within the distal sigmoid colon and rectum. Vascular/Lymphatic: There is evidence of marked severity atherosclerotic disease involving the visualized portion of the abdominal aorta and bilateral common iliac arteries. Evidence of prior fem-fem bypass graft is noted. Reproductive: A small, stable parenchymal calcification is noted within the uterus on the left. The bilateral adnexa are unremarkable. Other: A 4.4 cm cystic appearing structure (approximately 34.91 Hounsfield units) is seen within  the mid to lower left abdomen. Musculoskeletal: There is a chronic fracture deformity of the left inferior pubic ramus. No acute osseous abnormalities are identified. Numerous subcentimeter lytic areas are seen scattered throughout the lower pelvis and bilateral femurs. This is seen on the prior study and is more prominently noted on the current exam. Marked severity degenerative changes are noted within the bilateral hips and visualized portion of the lower lumbar spine. IMPRESSION: 1. Findings consistent with moderate to marked severity cystitis. Correlation with urinalysis is recommended. 2. Sigmoid diverticulosis. 3. Large amount of retained stool within the distal sigmoid colon and rectum. 4. Numerous subcentimeter lytic areas scattered throughout the pelvis and bilateral femurs, as described above. While this may represent sequelae associated with multiple myeloma, correlation with a nuclear medicine bone scan is recommended to exclude the presence of an underlying neoplastic process. 5. Marked severity degenerative changes within the bilateral hips and visualized portion of the lower lumbar spine. 6. Aortic atherosclerosis. Electronically Signed   By: Suzen Dials M.D.   On: 06/10/2024 23:01   CT Head Wo Contrast Result Date: 06/10/2024 CLINICAL DATA:  Ataxia. EXAM: CT HEAD WITHOUT CONTRAST TECHNIQUE: Contiguous axial images were obtained from the base of the skull through the vertex without intravenous contrast. RADIATION DOSE REDUCTION: This exam was performed according to the departmental dose-optimization program which includes automated exposure control, adjustment of the mA and/or kV according to patient size and/or use of iterative reconstruction technique. COMPARISON:  March 29, 2024 FINDINGS: Brain: There is generalized cerebral atrophy with widening of the extra-axial spaces and ventricular dilatation. There are areas of decreased attenuation within the white matter tracts of the  supratentorial brain, consistent with microvascular disease changes. 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 Vascular: No hyperdense vessel or unexpected calcification. Skull: Normal. Negative for fracture or focal lesion. Sinuses/Orbits: No acute finding. Other: None. IMPRESSION: 1. Generalized cerebral atrophy and microvascular disease changes of the supratentorial brain. 2. 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 3. No acute intracranial abnormality. Electronically Signed   By: Suzen Dials M.D.   On: 06/10/2024 20:41   DG Hip Unilat With Pelvis 2-3 Views Left Result Date: 06/10/2024 CLINICAL DATA:  pain EXAM: DG HIP (WITH OR WITHOUT PELVIS) 2-3V LEFT COMPARISON:  None Available. FINDINGS: Osteopenia.No evidence of pelvic fracture or diastasis.No acute hip fracture or dislocation.Severe bilateral hip joint space loss with bony sclerosis and osteophyte formation. Multilevel degenerative disc disease of the lumbar spine. Soft tissues are unremarkable. IMPRESSION: 1. No acute, displaced fracture or dislocation was visualized. However, underlying osteopenia decreases the sensitivity for nondisplaced fracture using plain radiography. If the patient is unable to bear weight, or there is high clinical suspicion, a follow-up CT should be considered for further evaluation. 2. Severe bilateral hip joint osteoarthritis Electronically Signed   By: Rogelia Myers  M.D.   On: 06/10/2024 19:06     Time coordinating discharge: Over 30 minutes    Alm Apo, MD  [facility] 06/14/2024, 12:00 PM

## 2024-06-14 NOTE — Progress Notes (Signed)
 Occupational Therapy Treatment Patient Details Name: Deanna [name] MRN: [id number] DOB: 05/26/1941 Today's Date: 06/14/2024   History of present illness Deanna [name] is a 83 yr old female brought to the hospital 06/10/24 for AMS, behavioral changes including worsening confusion and paranoia. PMH: R frontal white matter small infarct, TBI, dementia, paroxysmal atrial fibrillation, PAD,  right carotid artery endarterectomy and angioplasty, hypothyroidism, CKD, CAD s/p stent, anxiety and depression.   OT comments  The pt received seated in the bedside chair. She was seen for progression of ADL participation and functional strengthening. She required set-up assist for teeth brushing and face washing seated in the bedside chair. She was instructed on performing sit to stand transfers from the chair using a RW, for which she required min assist. She required constant assist for steadying, in order to demo marching in place. She reported tail bone pain with standing activity; she reported a history of a tailbone fracture sustained many years ago during childbirth. She required occasional redirection to tasks throughout the session. Continue OT plan of care. Patient will benefit from continued inpatient follow up therapy, <3 hours/day.       If plan is discharge home, recommend the following:  Direct supervision/assist for medications management;Direct supervision/assist for financial management;Assistance with cooking/housework;Assist for transportation;Supervision due to cognitive status;A little help with bathing/dressing/bathroom   Equipment Recommendations  None recommended by OT    Recommendations for Other Services      Precautions / Restrictions Precautions Precautions: Fall Restrictions Weight Bearing Restrictions Per Provider Order: No       Mobility Bed Mobility      General bed mobility comments: Patient was received seated in the bedside chair.    Transfers Overall  transfer level: Needs assistance Equipment used: Rolling walker (2 wheels) Transfers: Sit to/from Stand Sit to Stand: Min assist           General transfer comment: Pt was instructed on performing 2 sit to stand transfers from the chair. She required cues for hand placement and trunk extension. She demonstrated marching in place using a RW, requiring min assist.     Balance     Sitting balance-Leahy Scale: Fair         Standing balance comment: CGA to min assist with RW           ADL either performed or assessed with clinical judgement   ADL Overall ADL's : Needs assistance/impaired     Grooming: Set up;Supervision/safety;Sitting Grooming Details (indicate cue type and reason): She performed face washing and teeth brushing seated in the bedside chair.          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         Communication Communication Communication: No apparent difficulties   Cognition Arousal: Alert Behavior During Therapy: WFL for tasks assessed/performed Cognition: History of cognitive impairments, No family/caregiver present to determine baseline, Cognition impaired   Orientation impairments: Situation, Time   Memory impairment (select all impairments): Declarative long-term memory, Working memory Attention impairment (select first level of impairment): Divided attention Executive functioning impairment (select all impairments): Reasoning OT - Cognition Comments: Able to follow 1 step commands consistently. Anxious. Patient continues to report people are trying to impersonate her and her daughter.        Following commands: Intact        Cueing   Cueing Techniques: Verbal cues, Gestural cues  Pertinent Vitals/ Pain       Pain Assessment Pain Assessment: Faces Pain Score: 5  Pain Location: tail bone Pain Descriptors / Indicators: Grimacing, Sore Pain  Intervention(s): Repositioned, Limited activity within patient's tolerance, Monitored during session   Frequency  Min 2X/week        Progress Toward Goals  OT Goals(current goals can now be found in the care plan section)     Acute Rehab OT Goals OT Goal Formulation: With patient Time For Goal Achievement: 06/26/24 Potential to Achieve Goals: Good  Plan         AM-PAC OT 6 Clicks Daily Activity     Outcome Measure   Help from another person eating meals?: None Help from another person taking care of personal grooming?: A Little Help from another person toileting, which includes using toliet, bedpan, or urinal?: A Little Help from another person bathing (including washing, rinsing, drying)?: A Lot Help from another person to put on and taking off regular upper body clothing?: A Little Help from another person to put on and taking off regular lower body clothing?: A Lot 6 Click Score: 17    End of Session Equipment Utilized During Treatment: Rolling walker (2 wheels);Gait belt  OT Visit Diagnosis: Pain;Unsteadiness on feet (R26.81);Muscle weakness (generalized) (M62.81);Other symptoms and signs involving cognitive function Pain - part of body:  (tail bone)   Activity Tolerance Patient limited by pain   Patient Left in chair;with call bell/phone within reach   Nurse Communication Mobility status        Time: 1245-1305 OT Time Calculation (min): 20 min  Charges: OT General Charges $OT Visit: 1 Visit OT Treatments $Therapeutic Activity: 8-22 mins     Delanna JINNY Lesches, OTR/L 06/14/2024, 3:10 PM

## 2024-06-14 NOTE — Progress Notes (Signed)
 Mobility Specialist Progress Note:  RA Post mobility: 86% SPO2 - RN Notified   06/14/24 1434  Mobility  Activity  (Chair exercises)  Assistive Device Front wheel walker  Range of Motion/Exercises Active  Activity Response Tolerated well  Mobility Referral Yes  Mobility visit 1 Mobility  Mobility Specialist Start Time (ACUTE ONLY) 1318  Mobility Specialist Stop Time (ACUTE ONLY) 1330  Mobility Specialist Time Calculation (min) (ACUTE ONLY) 12 min   Pt was received in recliner and agreed to chair mobility: Seated BLE Exercises:  1) Knee Extension: 2 x 5 each leg  2) Marching: 2 x 5 each leg    3) Hip Adduction (pillow squeezes) : 1 x 5   No complaints during session, SPO2 notified to RN immediately. Returned to recliner with all needs met. Call bell in reach.   [employer] - Mobility Specialist

## 2024-06-14 NOTE — TOC Transition Note (Signed)
 Transition of Care [facility]) - Discharge Note   Patient Details  Name: Deanna [name] MRN: [id number] Date of Birth: [birth year]  Transition of Care [facility]) CM/SW Contact:  Doneta Glenys DASEN, RN Phone Number: 06/14/2024, 12:14 PM   Clinical Narrative:    Per MD patient ready for DC to [facility] . RN to call report prior to discharge [phone] Rm [phone]). RN, patient, patient's family([name]), and facility notified of DC. Discharge Summary and FL2 sent to facility via HUB. DC packet on chart includes face sheet, medical necessity, discharge summary, and 1 prescription. Ambulance [facility] transport requested for patient.  Please consult us  again if new needs arise.     Final next level of care: Skilled Nursing Facility Barriers to Discharge: Barriers Resolved   Patient Goals and CMS Choice Patient states their goals for this hospitalization and ongoing recovery are:: To [facility] CMS Medicare.gov Compare Post Acute Care list provided to:: Patient Choice offered to / list presented to : Patient [place] ownership interest in [facility].provided to:: Parent NA    Discharge Placement   Existing PASRR number confirmed : 06/14/24          Patient [facility] bed at: [facility] Patient to be transferred to facility by: [facility] Name of family member notified: Deanna [name] (daughter) Patient and family notified of of transfer: 06/14/24  Discharge Plan and Services Additional resources added to the After Visit Summary for   In-house Referral: Clinical Social Work Discharge Planning Services: CM Consult            DME Arranged: N/A DME Agency: NA       HH Arranged: NA HH Agency: NA        Social Drivers of Health (SDOH) Interventions SDOH Screenings   Food Insecurity: Food Insecurity Present (06/12/2024)  Housing: Patient Unable To Answer (06/12/2024)  Transportation Needs: Patient Unable To Answer (06/12/2024)  Utilities: Patient Unable To Answer (06/12/2024)   Depression (PHQ2-9): Low Risk  (05/01/2024)  Social Connections: Patient Unable To Answer (06/12/2024)  Tobacco Use: Medium Risk (06/10/2024)     Readmission Risk Interventions    08/18/2023    2:28 PM  Readmission Risk Prevention Plan  Post Dischage Appt Complete  Medication Screening Complete  Transportation Screening Complete

## 2024-06-14 NOTE — TOC Progression Note (Signed)
 Transition of Care [facility]) - Progression Note    Patient Details  Name: Deanna [name] MRN: [id number] Date of Birth: 22-May-1941  Transition of Care [facility]) CM/SW Contact  Alassane Kalafut, Glenys DASEN, RN  Clinical Narrative:    Cm received a call from [name] Wollman-Young (daughter). She informed CM that she appreciated the welfare check and how hard it's been caring for her mother for the pass few weeks. That she has not been able to sleep due her mother waking up at 1:00 AM to 5:00 AM confused, managing the household bills, and her mental health has suffered due to all things happening. Bruno stated that she is good with her mother going to [facility] and will be up to visit her tomorrow.   Expected Discharge Plan: Long Term Nursing Home Barriers to Discharge: Family Issues, SNF Pending bed offer               Expected Discharge Plan and Services In-house Referral: Clinical Social Work Discharge Planning Services: CM Consult   Living arrangements for the past 2 months: Apartment                 DME Arranged: N/A DME Agency: NA       HH Arranged: NA HH Agency: NA         Social Drivers of Health (SDOH) Interventions SDOH Screenings   Food Insecurity: Food Insecurity Present (06/12/2024)  Housing: Patient Unable To Answer (06/12/2024)  Transportation Needs: Patient Unable To Answer (06/12/2024)  Utilities: Patient Unable To Answer (06/12/2024)  Depression (PHQ2-9): Low Risk  (05/01/2024)  Social Connections: Patient Unable To Answer (06/12/2024)  Tobacco Use: Medium Risk (06/10/2024)    Readmission Risk Interventions    08/18/2023    2:28 PM  Readmission Risk Prevention Plan  Post Dischage Appt Complete  Medication Screening Complete  Transportation Screening Complete

## 2024-06-14 NOTE — NC FL2 (Signed)
 [place]  MEDICAID FL2 LEVEL OF CARE FORM     IDENTIFICATION  Patient Name: Deanna [name] Birthdate: 11-01-1940 Sex: female Admission Date (Current Location): 06/10/2024  [facility] and [place] Number:  Producer, Television/film/video and Address:  [facility],  [address] [place], [place] [address]      Provider Number: [id number]  Attending Physician Name and Address:  Patsy Lenis, MD  Relative Name and Phone Number:  Rodneisha, Bonnet (Daughter)  [phone] (Mobile    Current Level of Care: Hospital Recommended Level of Care: Skilled Nursing Facility Prior Approval Number:    Date Approved/Denied:   PASRR Number: [id number]  Discharge Plan: SNF    Current Diagnoses: Patient Active Problem List   Diagnosis Date Noted   History of CVA (cerebrovascular accident) 06/12/2024   Paranoia (HCC) 06/11/2024   Constipation 06/11/2024   Lytic bone lesions on xray 06/11/2024   Dementia with behavioral disturbance (HCC) 04/28/2024   Altered mental status 04/24/2024   Psychosis (HCC) 04/24/2024   Acute CVA (cerebrovascular accident) (HCC) 12/20/2023   Hypoxia 12/16/2023   Carotid artery stenosis, asymptomatic, right 12/13/2023   Left arm pain 08/17/2023   Delirium 08/17/2023   UTI (urinary tract infection) 08/16/2023   Anxiety 04/24/2023   Acute respiratory failure with hypoxia (HCC) 06/17/2022   COVID-19 virus infection 06/17/2022   Stage 3b chronic kidney disease (CKD) (HCC) - baseline SCr 1.8 06/17/2022   PAD (peripheral artery disease) 10/11/2021   PAF (paroxysmal atrial fibrillation) (HCC) 10/11/2021   Hypokalemia 05/09/2021   Status post coronary artery stent placement    Overactive bladder 12/16/2019   Generalized anxiety disorder 06/17/2018   Mood disorder 06/17/2018   Chronic diastolic CHF (congestive heart failure) (HCC) 11/16/2017   Primary open angle glaucoma of both eyes, moderate stage 11/09/2017   Risk for falls 11/22/2016   Hearing disorder  11/03/2016   Spondylosis of lumbar region without myelopathy or radiculopathy 02/25/2016   History of traumatic brain injury 11/04/2015   Primary osteoarthritis of both hips 08/17/2015   Cognitive impairment 07/08/2015   OSA on CPAP 04/26/2015   Chronic pain 04/26/2015   Physical deconditioning 04/26/2015   Hyperlipidemia 07/02/2014   Carotid artery stenosis 03/26/2012   Shortness of breath 12/19/2011   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, 2011 11/28/2011   History of colonic polyps 06/18/2010   Hypothyroidism 11/17/2008   Obesity, Class III, BMI 40-49.9 (morbid obesity) (HCC) 11/17/2008   ANXIETY DEPRESSION 11/17/2008   Essential hypertension 11/17/2008   HEMORRHOIDS 11/17/2008   GERD 11/17/2008   IBS 11/17/2008   Atherosclerosis of coronary artery bypass graft(s), unspecified, with other forms of angina pectoris     Orientation RESPIRATION BLADDER Height & Weight     Self, Place  Normal Incontinent, External catheter Weight: 75.6 kg Height:  5' 1 (154.9 cm)  BEHAVIORAL SYMPTOMS/MOOD NEUROLOGICAL BOWEL NUTRITION STATUS      Continent Diet  AMBULATORY STATUS COMMUNICATION OF NEEDS Skin   Extensive Assist Verbally Normal                       Personal Care Assistance Level of Assistance  Bathing, Feeding, Dressing Bathing Assistance: Limited assistance Feeding assistance: Limited assistance Dressing Assistance: Limited assistance     Functional Limitations Info  Sight Sight Info: Impaired        SPECIAL CARE FACTORS FREQUENCY  PT (By licensed PT), OT (By licensed OT)     PT Frequency: 5X weekly OT Frequency: 5x weekly  Contractures Contractures Info: Not present    Additional Factors Info  Code Status, Allergies, Psychotropic Code Status Info: FULL Allergies Info: Lidocaine , Cymbalta  (Duloxetine  Hcl), Donepezil, Mobic (Meloxicam), Namenda (Memantine), Nsaids, Other, Oxycontin  (Oxycodone ), Vicodin  (Hydrocodone-acetaminophen  Psychotropic Info: Alprazolam , Escitalopram , Quetiapine          Current Medications (06/14/2024):  This is the current hospital active medication list Current Facility-Administered Medications  Medication Dose Route Frequency Provider Last Rate Last Admin   acetaminophen  (TYLENOL ) tablet 650 mg  650 mg Oral Q6H PRN Foust, Katy L, NP   650 mg at 06/13/24 0114   Or   acetaminophen  (TYLENOL ) suppository 650 mg  650 mg Rectal Q6H PRN Foust, Katy L, NP       ALPRAZolam  (XANAX ) tablet 0.5 mg  0.5 mg Oral Daily PRN Mannie Pac T, DO   0.5 mg at 06/13/24 1307   aspirin  EC tablet 81 mg  81 mg Oral QHS Mannie Pac T, DO   81 mg at 06/13/24 2257   busPIRone  (BUSPAR ) tablet 15 mg  15 mg Oral BID Mannie Pac T, DO   15 mg at 06/14/24 1001   clopidogrel  (PLAVIX ) tablet 75 mg  75 mg Oral Daily Foust, Katy L, NP   75 mg at 06/14/24 1000   divalproex  (DEPAKOTE  ER) 24 hr tablet 250 mg  250 mg Oral QHS Mannie Pac T, DO   250 mg at 06/13/24 2258   dorzolamide  (TRUSOPT ) 2 % ophthalmic solution 1 drop  1 drop Both Eyes BID Mannie Pac T, DO   1 drop at 06/14/24 1004   escitalopram  (LEXAPRO ) tablet 20 mg  20 mg Oral Daily Mannie Pac T, DO   20 mg at 06/14/24 1001   ezetimibe  (ZETIA ) tablet 10 mg  10 mg Oral Daily Mannie Pac T, DO   10 mg at 06/14/24 1000   furosemide  (LASIX ) tablet 40 mg  40 mg Oral Daily PRN Mannie Pac T, DO       gabapentin  (NEURONTIN ) capsule 200 mg  200 mg Oral Q1200 Foust, Katy L, NP   200 mg at 06/13/24 1307   haloperidol  lactate (HALDOL ) injection 2 mg  2 mg Intravenous Q6H PRN Foust, Katy L, NP   2 mg at 06/12/24 2329   heparin  injection 5,000 Units  5,000 Units Subcutaneous Q8H Foust, Katy L, NP   5,000 Units at 06/14/24 0609   latanoprost  (XALATAN ) 0.005 % ophthalmic solution 1 drop  1 drop Both Eyes QHS Foust, Katy L, NP   1 drop at 06/13/24 2300   levothyroxine  (SYNTHROID ) tablet 175 mcg  175 mcg Oral Q0600 Patsy Lenis,  MD   175 mcg at 06/14/24 9390   metoprolol  succinate (TOPROL -XL) 24 hr tablet 25 mg  25 mg Oral Daily Mannie Pac T, DO   25 mg at 06/14/24 1002   ondansetron  (ZOFRAN ) tablet 4 mg  4 mg Oral Q6H PRN Foust, Katy L, NP       Or   ondansetron  (ZOFRAN ) injection 4 mg  4 mg Intravenous Q6H PRN Foust, Katy L, NP       oxyCODONE  (Oxy IR/ROXICODONE ) immediate release tablet 5 mg  5 mg Oral Q6H PRN Jillian Buttery, MD   5 mg at 06/14/24 0150   pantoprazole  (PROTONIX ) EC tablet 40 mg  40 mg Oral Daily Mannie Pac T, DO   40 mg at 06/14/24 1002   polyethylene glycol (MIRALAX  / GLYCOLAX ) packet 17 g  17 g Oral Daily Mannie Pac T, DO   17  g at 06/14/24 1000   polyethylene glycol (MIRALAX  / GLYCOLAX ) packet 17 g  17 g Oral Daily PRN Foust, Katy L, NP       QUEtiapine  (SEROQUEL ) tablet 25 mg  25 mg Oral QHS Mannie Pac T, DO   25 mg at 06/13/24 2257   QUEtiapine  (SEROQUEL ) tablet 25 mg  25 mg Oral Daily Jillian Buttery, MD   25 mg at 06/13/24 1736   rosuvastatin  (CRESTOR ) tablet 40 mg  40 mg Oral Daily Foust, Katy L, NP   40 mg at 06/14/24 1001   senna-docusate (Senokot-S) tablet 1 tablet  1 tablet Oral BID Foust, Katy L, NP   1 tablet at 06/14/24 1000     Discharge Medications: Please see discharge summary for a list of discharge medications.  Relevant Imaging Results:  Relevant Lab Results:   Additional Information SSN [id number]  Doneta Glenys DASEN, RN

## 2024-06-14 NOTE — Progress Notes (Signed)
 Report called and given to Tammy, LPN at [facility] facility. All of nurse's questions answered to her satisfaction. Patient is to be transferred momentarily. Awaiting pickup by [facility].

## 2024-06-14 NOTE — TOC Progression Note (Signed)
 Transition of Care [facility]) - Progression Note    Patient Details  Name: Deanna [name] MRN: [id number] Date of Birth: 1940-08-11  Transition of Care [facility]) CM/SW Contact  Doneta Glenys DASEN, RN Phone Number: 06/14/2024, 9:28 AM  Clinical Narrative:    Firefighter Approved. Plan Auth ID - [id number] Auth ID - [id number] Next Review Date - 06/14/2024-06/18/2024  Expected Discharge Plan: Long Term Nursing Home Barriers to Discharge: Family Issues, SNF Pending bed offer               Expected Discharge Plan and Services In-house Referral: Clinical Social Work Discharge Planning Services: CM Consult   Living arrangements for the past 2 months: Apartment                 DME Arranged: N/A DME Agency: NA       HH Arranged: NA HH Agency: NA         Social Drivers of Health (SDOH) Interventions SDOH Screenings   Food Insecurity: Food Insecurity Present (06/12/2024)  Housing: Patient Unable To Answer (06/12/2024)  Transportation Needs: Patient Unable To Answer (06/12/2024)  Utilities: Patient Unable To Answer (06/12/2024)  Depression (PHQ2-9): Low Risk  (05/01/2024)  Social Connections: Patient Unable To Answer (06/12/2024)  Tobacco Use: Medium Risk (06/10/2024)    Readmission Risk Interventions    08/18/2023    2:28 PM  Readmission Risk Prevention Plan  Post Dischage Appt Complete  Medication Screening Complete  Transportation Screening Complete

## 2024-06-17 ENCOUNTER — Ambulatory Visit: Admitting: Nurse Practitioner

## 2024-07-01 ENCOUNTER — Ambulatory Visit: Payer: Self-pay | Admitting: Nurse Practitioner

## 2024-07-01 ENCOUNTER — Encounter: Payer: Self-pay | Admitting: Nurse Practitioner

## 2024-07-01 VITALS — BP 128/88 | HR 75 | Temp 97.6°F | Resp 18 | Ht 61.0 in | Wt 173.8 lb

## 2024-07-01 DIAGNOSIS — E039 Hypothyroidism, unspecified: Secondary | ICD-10-CM

## 2024-07-01 DIAGNOSIS — I480 Paroxysmal atrial fibrillation: Secondary | ICD-10-CM

## 2024-07-01 DIAGNOSIS — I10 Essential (primary) hypertension: Secondary | ICD-10-CM | POA: Diagnosis not present

## 2024-07-01 DIAGNOSIS — E785 Hyperlipidemia, unspecified: Secondary | ICD-10-CM

## 2024-07-01 DIAGNOSIS — F03C2 Unspecified dementia, severe, with psychotic disturbance: Secondary | ICD-10-CM

## 2024-07-01 DIAGNOSIS — F411 Generalized anxiety disorder: Secondary | ICD-10-CM | POA: Diagnosis not present

## 2024-07-01 DIAGNOSIS — Z23 Encounter for immunization: Secondary | ICD-10-CM

## 2024-07-01 DIAGNOSIS — I5042 Chronic combined systolic (congestive) and diastolic (congestive) heart failure: Secondary | ICD-10-CM

## 2024-07-01 DIAGNOSIS — K5904 Chronic idiopathic constipation: Secondary | ICD-10-CM

## 2024-07-01 DIAGNOSIS — Z8673 Personal history of transient ischemic attack (TIA), and cerebral infarction without residual deficits: Secondary | ICD-10-CM

## 2024-07-01 MED ORDER — REXULTI 0.5 MG PO TABS: ORAL_TABLET | ORAL | 0 refills | Status: DC

## 2024-07-01 MED ORDER — BREXPIPRAZOLE 2 MG PO TABS: 2.0000 mg | ORAL_TABLET | Freq: Every day | ORAL | 2 refills | Status: DC

## 2024-07-01 NOTE — Progress Notes (Signed)
 Careteam: Patient Care Team: Caro [name] POUR, NP as PCP - General (Geriatric Medicine) Court Dorn PARAS, MD as PCP - Cardiology (Cardiology) Abran Norleen SAILOR, MD as Consulting Physician (Gastroenterology) Liam Lerner, MD as Consulting Physician (Orthopedic Surgery) Roz Anes, MD as Consulting Physician (Ophthalmology) Gaston Hamilton, MD as Consulting Physician (Urology) Serene Gaile ORN, MD as Consulting Physician (Vascular Surgery) Georjean Darice HERO, MD as Consulting Physician (Neurology) Raj Rankin SAUNDERS, MD as Referring Physician (Ophthalmology) Franchot [name] SQUIBB, PMHNP as Nurse Practitioner (Psychiatry) Georjean Darice HERO, MD as Consulting Physician (Neurology)  PLACE OF SERVICE:  [facility]  Advanced Directive information    Allergies  Allergen Reactions   Lidocaine  Swelling and Other (See Comments)    Use CORRECT dose for age/weight- red welts and tongue swelling result, if not   Cymbalta  [Duloxetine  Hcl] Other (See Comments)    Increased confusion and memory concerns    Donepezil Other (See Comments)    Altered mood and Aggression     Mobic [Meloxicam] Other (See Comments)    Heart failure- cannot take   Namenda [Memantine] Other (See Comments)    Severe aggression   Nsaids Other (See Comments)    Cannot have due to heart issues   Other Other (See Comments)    No salt or anything that might cause fluid retention/swelling!!   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, however(??)   Vicodin [Hydrocodone-Acetaminophen ] Other (See Comments)    Delirium, Confusion, and Toxic dementia    Chief Complaint  Patient presents with   Medical Management of Chronic Issues    f/u w/PCP     HPI:  Discussed the use of AI scribe software for clinical note transcription with the patient, who gave verbal consent to proceed.  History of Present Illness Deanna [name] is an 83 year old female with  dementia and anxiety who presents for follow-up after recent hospitalization for increased confusion and paranoia. She is accompanied by her daughter, who is her primary caregiver.  She was recently hospitalized due to increased confusion and paranoia, and was discharged on November 7th. She spent a couple of weeks at [facility] before returning home on November 21st. During her stay, Xanax  was added to her medication regimen to help manage her anxiety, particularly during episodes of sundowning, which occur daily between 3 and 5 PM. During these episodes, she experiences uncontrollable shaking and severe anxiety, requiring constant reassurance from her daughter.  She has a history of chronic kidney disease, arthritis, traumatic brain injury, acid reflux, sleep apnea, hypothyroidism, stroke, atrial fibrillation, congestive heart failure, hypertension, high cholesterol, coronary artery disease, anxiety, depression, and dementia. Her daughter reports that any type of infection, such as UTIs or yeast infections, exacerbates her anxiety and confusion. Recently, a CT scan revealed numerous lytic areas in her pelvis and bilateral femur; her daughter reports that a PET scan was performed and that the bone scan was negative   Her current medications include Synthroid  175 mcg daily for hypothyroidism, metoprolol  and Lasix  as needed for congestive heart failure, and a combination of Zetia  and rosuvastatin  for cholesterol management. She also takes aspirin  and Plavix  following a stroke   Despite being on Buspar , Depakote , Seroquel , and escitalopram  for anxiety and mood stabilization, her daughter notes that these medications have not significantly alleviated her symptoms.  She experiences episodes of confusion and memory lapses, sometimes forgetting recent events or where she lives. Her daughter describes  her memory as fluctuating, with periods of lucidity followed by confusion. She has a history of falls,  including one incident where she fell backwards in the grass and another at the healthcare facility. Her daughter expresses concern about her ability to remember appointments and manage her medications.  Her daughter reports that she occasionally experiences edema in her ankles, which is managed with furosemide  as needed. She does not experience shortness of breath, and she has been careful with salt intake to manage her heart failure. Constipation is also a concern.    Review of Systems:  Review of Systems  Unable to perform ROS: Dementia    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   CKD (chronic kidney disease), stage IV (HCC)    Cognitive impairment    Colon polyps    Community acquired pneumonia    Depression    Diverticulosis    Dysuria    Encephalomalacia    GAD (generalized anxiety disorder)    GERD (gastroesophageal reflux disease)    Glaucoma    Hearing loss    Hemorrhoids    Hypertension    Hypothyroidism    Incontinence    Mixed hyperlipidemia    Myocardial infarction (HCC)    NSTEMI (non-ST elevated myocardial infarction) (HCC) 12/19/2011   OSA on CPAP    PAF (paroxysmal atrial fibrillation) (HCC)    Peripheral arterial disease    Pneumonitis 04/26/2015   Pyelonephritis due to Escherichia coli 04/28/2015   Sepsis (HCC)    Spondylosis of lumbar region without myelopathy or radiculopathy    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HCC)    Urticaria    Vertigo, benign positional    Past Surgical History:  Procedure Laterality Date   ABDOMINAL AORTOGRAM W/LOWER EXTREMITY N/A 08/22/2017   Procedure: ABDOMINAL AORTOGRAM W/LOWER EXTREMITY;  Surgeon: Serene Gaile ORN, MD;   Location: [facility] INVASIVE CV LAB;  Service: Cardiovascular;  Laterality: N/A;   BILATERAL UPPER EXTREMITY ANGIOGRAM N/A 09/11/2012   Procedure: BILATERAL UPPER EXTREMITY ANGIOGRAM;  Surgeon: Gaile ORN Serene, MD;  Location: [facility] CATH LAB;  Service: Cardiovascular;  Laterality: N/A;   BIOPSY  03/25/2020   Procedure: BIOPSY;  Surgeon: Wilhelmenia Aloha Raddle., MD;  Location: [facility] ENDOSCOPY;  Service: Gastroenterology;;   carotid duplex doppler Bilateral 09/03/2012, 11/03/2011   Evidence of 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 patent left common carotid- subclavian artery bypass with turbulent flow noted at the anastomosis with velocities of 295 cm/s   CAROTID-SUBCLAVIAN BYPASS GRAFT  12/15/2011   Procedure: BYPASS GRAFT CAROTID-SUBCLAVIAN;  Surgeon: Gaile ORN Serene, MD;  Location: [facility] OR;  Service: Vascular;  Laterality: Left;  Left Carotid subclavian bypass   CORONARY ANGIOPLASTY     CORONARY ARTERY BYPASS GRAFT     CORONARY ATHERECTOMY N/A 03/19/2020   Procedure: CORONARY ATHERECTOMY;  Surgeon: Dann Candyce RAMAN, MD;  Location: [facility] INVASIVE CV LAB;  Service: Cardiovascular;  Laterality: N/A;   CORONARY STENT INTERVENTION N/A 03/19/2020   Procedure: CORONARY STENT INTERVENTION;  Surgeon: Dann Candyce RAMAN, MD;  Location: [facility] INVASIVE CV LAB;  Service: Cardiovascular;  Laterality: N/A;   DOPPLER ECHOCARDIOGRAPHY  05/27/2010, 09/17/2008   Mild Proximal septal thickening is noted. Left ventricular systolic functions is normal ejection fraction =>55%. the aortic valve appears to be mildly sclerotic    ENDARTERECTOMY Right 12/13/2023   Procedure: ENDARTERECTOMY, CAROTID;  Surgeon: Serene Gaile ORN, MD;  Location: [facility] OR;  Service: Vascular;  Laterality: Right;   ESOPHAGOGASTRODUODENOSCOPY (EGD) WITH PROPOFOL  N/A 03/25/2020   Procedure: ESOPHAGOGASTRODUODENOSCOPY (EGD) WITH  PROPOFOL ;  Surgeon: Wilhelmenia Aloha Raddle., MD;  Location: [facility] ENDOSCOPY;  Service: Gastroenterology;  Laterality: N/A;   fem-fem bypass graft  1999   holter monitor  01/21/2008   The predominant rhythm was normal sinus rhythm. Minimum heartrate of 63 bpm at +01:00, maximum heartrate of 105 bpm at + 10:35; and the average heartrate of 75 bpm. Ventricular ectopic activity totaled 1270: Multifocal; 866-PVC's and 404-VEs              JOINT REPLACEMENT     Left knee   LEFT HEART CATH AND CORS/GRAFTS ANGIOGRAPHY N/A 03/19/2020   Procedure: LEFT HEART CATH AND CORS/GRAFTS ANGIOGRAPHY;  Surgeon: Dann Candyce RAMAN, MD;  Location: [facility] INVASIVE CV LAB;  Service: Cardiovascular;  Laterality: N/A;   LEFT HEART CATHETERIZATION WITH CORONARY/GRAFT ANGIOGRAM N/A 12/21/2011   Procedure: LEFT HEART CATHETERIZATION WITH EL BILE;  Surgeon: Alm ORN Clay, MD;  Location: [facility] CATH LAB;  Service: Cardiovascular;  Laterality: N/A;   NM MYOCAR PERF EJECTION FRACTION  09/22/2009, 07/03/2007   the post stress myocardial perfusion images show a normal pattern of perfusion is all regions. The post-stress ejection fraction is 68 %. no significant wall motion abnormalities noted. This is a low risk scan.   PATCH ANGIOPLASTY Right 12/13/2023   Procedure: ANGIOPLASTY, USING PATCH GRAFT;  Surgeon: Serene Gaile ORN, MD;  Location: [facility] OR;  Service: Vascular;  Laterality: Right;   PERIPHERAL VASCULAR INTERVENTION  08/22/2017   Procedure: PERIPHERAL VASCULAR INTERVENTION;  Surgeon: Serene Gaile ORN, MD;  Location: [facility] INVASIVE CV LAB;  Service: Cardiovascular;;  Fem/Fem Graft   REPLACEMENT TOTAL KNEE  05-2011   UNILATERAL UPPER EXTREMEITY ANGIOGRAM Left 11/15/2011   Procedure: UNILATERAL UPPER VENITA BILE;  Surgeon: Dorn JINNY Lesches, MD;  Location: [facility] CATH LAB;  Service: Cardiovascular;  Laterality: Left;   Social History:   reports that she quit smoking about 42 years ago. Her smoking use included cigarettes. She  started smoking about 45 years ago. She has never used smokeless tobacco. She reports that she does not currently use alcohol. She reports that she does not use drugs.  Family History  Problem Relation Age of Onset   Heart attack Mother    Heart disease Mother        before age 92   Diabetes Father    Heart disease Father    Hypertension Father    Hyperlipidemia Father    Heart attack Father 64   Heart attack Brother 77   Cerebral palsy Sister 60   Congestive Heart Failure Sister 1   Heart attack Sister 81   Hypertension Sister 26   Dementia Sister    Anxiety disorder Sister    Anxiety disorder Sister 39   Heart Problems Sister    Stroke Sister    Heart Problems Sister 57   Heart attack Sister 27   Colon cancer Brother 8   Prostate cancer Brother 21   Hypertension Daughter    Irritable bowel syndrome Daughter    Depression Daughter    Anxiety disorder Daughter     Medications: Patient's Medications  New Prescriptions  No medications on file  Previous Medications   ACETAMINOPHEN  (TYLENOL ) 325 MG TABLET    Take 2 tablets (650 mg total) by mouth every 4 (four) hours as needed for mild pain (pain score 1-3), fever, moderate pain (pain score 4-6) or headache (or Fever >/= 101).   ALBUTEROL  (VENTOLIN  HFA) 108 (90 BASE) MCG/ACT INHALER    Inhale 2 puffs into the lungs every 6 (six) hours as needed for wheezing or shortness of breath.   ALPRAZOLAM  (XANAX ) 0.5 MG TABLET    Take 1 tablet (0.5 mg total) by mouth daily as needed for anxiety.   ASPIRIN  EC 81 MG TABLET    Take 81 mg by mouth at bedtime.   BUSPIRONE  (BUSPAR ) 15 MG TABLET    Take 1 tablet (15 mg total) by mouth 2 (two) times daily.   CLOPIDOGREL  (PLAVIX ) 75 MG TABLET    Take 75 mg by mouth daily.   DIVALPROEX  (DEPAKOTE  ER) 250 MG 24 HR TABLET    Take 1 tablet (250 mg total) by mouth at bedtime.   DORZOLAMIDE  (TRUSOPT ) 2 % OPHTHALMIC SOLUTION    Place 1 drop into both eyes in the morning and at bedtime.   ESCITALOPRAM   (LEXAPRO ) 20 MG TABLET    Take 1 tablet (20 mg total) by mouth daily.   EZETIMIBE  (ZETIA ) 10 MG TABLET    TAKE 1 TABLET(10 MG) BY MOUTH DAILY   FUROSEMIDE  (LASIX ) 40 MG TABLET    Take 1 tablet (40 mg total) by mouth daily as needed for fluid or edema.   GABAPENTIN  (NEURONTIN ) 100 MG CAPSULE    Take 200 mg by mouth daily at 12 noon.   LATANOPROST  (XALATAN ) 0.005 % OPHTHALMIC SOLUTION    Place 1 drop into both eyes at bedtime.   LEVOTHYROXINE  (SYNTHROID ) 175 MCG TABLET    Take 1 tablet (175 mcg total) by mouth daily at 6 (six) AM.   METOPROLOL  SUCCINATE (TOPROL -XL) 25 MG 24 HR TABLET    Take 1 tablet (25 mg total) by mouth daily.   NITROGLYCERIN  (NITROSTAT ) 0.4 MG SL TABLET    Place 0.4 mg under the tongue every 5 (five) minutes as needed for chest pain.   PANTOPRAZOLE  (PROTONIX ) 40 MG TABLET    Take 1 tablet (40 mg total) by mouth daily.   QUETIAPINE  (SEROQUEL ) 25 MG TABLET    Take 1 tablet (25 mg total) by mouth at bedtime. Would wean as tolerated   ROSUVASTATIN  (CRESTOR ) 40 MG TABLET    Take 40 mg by mouth daily.  Modified Medications   No medications on file  Discontinued Medications   No medications on file    Physical Exam:  Vitals:   07/01/24 1423  BP: 128/88  Pulse: 75  Resp: 18  Temp: 97.6 F (36.4 C)  SpO2: 97%  Weight: 173 lb 12.8 oz (78.8 kg)  Height: 5' 1 (1.549 m)   Body mass index is 32.84 kg/m. Wt Readings from Last 3 Encounters:  07/01/24 173 lb 12.8 oz (78.8 kg)  06/10/24 166 lb 10.7 oz (75.6 kg)  05/01/24 166 lb 9.6 oz (75.6 kg)    Physical Exam Constitutional:      General: She is not in acute distress.    Appearance: She is well-developed. She is not diaphoretic.  HENT:     Head: Normocephalic and atraumatic.     Mouth/Throat:     Pharynx: No oropharyngeal exudate.  Eyes:     Conjunctiva/sclera: Conjunctivae normal.     Pupils: Pupils  are equal, round, and reactive to light.  Cardiovascular:     Rate and Rhythm: Normal rate and regular rhythm.      Heart sounds: Normal heart sounds.  Pulmonary:     Effort: Pulmonary effort is normal.     Breath sounds: Normal breath sounds.  Abdominal:     General: Bowel sounds are normal.     Palpations: Abdomen is soft.  Musculoskeletal:     Cervical back: Normal range of motion and neck supple.     Right lower leg: No edema.     Left lower leg: No edema.  Skin:    General: Skin is warm and dry.  Neurological:     Mental Status: She is alert.  Psychiatric:        Mood and Affect: Mood is anxious.        Cognition and Memory: Cognition is impaired. Memory is impaired.     Labs reviewed: Basic Metabolic Panel: Recent Labs    12/15/23 2148 12/18/23 0300 12/21/23 0258 02/06/24 1544 03/29/24 1739 06/11/24 0742 06/12/24 0442 06/13/24 0500  NA 139   < > 138 141   < > 142 139 140  K 4.3   < > 4.1 4.1   < > 4.3 4.6 4.9  CL 108   < > 98 103   < > 104 104 102  CO2 23   < > 31 26   < > 29 27 30   GLUCOSE 122*   < > 113* 97   < > 96 100* 94  BUN 15   < > 19 20   < > 18 21 20   CREATININE 1.25*   < > 1.54* 1.43*   < > 1.46* 1.70* 1.61*  CALCIUM  9.1   < > 9.1 10.1   < > 9.3 8.8* 9.4  MG  --    < > 1.6*  --   --  2.2  --  2.3  PHOS  --   --  2.8  --   --   --   --   --   TSH 0.236*  --   --  1.71  --  25.100*  --   --    < > = values in this interval not displayed.   Liver Function Tests: Recent Labs    03/29/24 1739 04/23/24 1954 06/10/24 1748  AST 22 20 19   ALT 22 8 16   ALKPHOS 71 104 95  BILITOT 1.4* 1.0 1.0  PROT 6.4* 6.9 6.6  ALBUMIN  3.7 4.2 3.8   Recent Labs    03/29/24 1739  LIPASE 36   Recent Labs    12/18/23 1018 06/10/24 1747  AMMONIA 39* 23   CBC: Recent Labs    12/21/23 0258 02/06/24 1544 03/29/24 1739 06/10/24 1748 06/11/24 0742 06/12/24 0442  WBC 6.4 6.8   < > 8.3 6.3 5.3  NEUTROABS 3.0 4,570  --  6.6  --   --   HGB 12.0 13.7   < > 12.9 12.3 11.2*  HCT 37.4 43.5   < > 41.0 39.0 37.9  MCV 86.4 87.5   < > 88.6 90.5 91.1  PLT 193 170   < > 211 175  179   < > = values in this interval not displayed.   Lipid Panel: Recent Labs    12/14/23 0424  CHOL 73  HDL 22*  LDLCALC 29  TRIG 890  CHOLHDL 3.3   TSH: Recent Labs    12/15/23 2148  02/06/24 1544 06/11/24 0742  TSH 0.236* 1.71 25.100*   A1C: Lab Results  Component Value Date   HGBA1C 5.3 12/18/2023    Assessment/Plan  Immunization due -     Flu vaccine HIGH DOSE PF(Fluzone Trivalent)   Assessment and Plan Assessment & Plan Dementia with psychotic disturbance and sundowning Increased confusion and paranoia, exacerbated by sundowning. Recent hospitalization for these symptoms. Memory fluctuates. Has had increase in falls Falls possibly related to confusion and dementia. - Continue Xanax  for sundowning symptoms however to use sparingly AS NEEDED Discussed with neuro PA and will start rexulti  titration 0.5 mg daily x 1 week, 1 mg daily for 1 week then 2 mg daily thereafter -samples provided and placed upfront for pick up -to stop seroquel - could also be increasing risk of falls.  - Referred to psychiatrist for further evaluation and management.  Generalized anxiety disorder Severe anxiety, particularly during sundowning. Current medications include Buspar , Depakote , Seroquel , and escitalopram . Xanax  added for acute episodes. Anxiety exacerbated by confusion and dementia. - Referred to psychiatrist for medication management. -stop Seroquel  as above and adding rexult  Hypothyroidism TSH levels were high during recent hospitalization. Currently on Synthroid  175 mcg daily. Previous dose was incorrectly recorded as 150 mcg. - Ordered TSH level to assess current thyroid  function. - Continue Synthroid  175 mcg daily and adjust as needed  Chronic combined systolic and diastolic heart failure Managed with metoprolol  and Lasix  as needed. No current shortness of breath, but occasional edema noted. - Continue metoprolol . - Use Lasix  as needed for edema.  Essential  hypertension Managed with metoprolol . - Continue metoprolol .  Paroxysmal atrial fibrillation Managed with metoprolol , also on aspirin  and Plavix . - Continue current regimen  Constipation Ongoing, Start Miralax  daily for constipation management.  Coronary artery disease Managed with aspirin  and Plavix . History of quadruple bypass and carotid bypasses. - Continue aspirin  and Plavix . -no chest pains   Hyperlipidemia Managed with Zetia  and rosuvastatin . Cholesterol levels are well-controlled. - Continue Zetia  and rosuvastatin .  Hx of CVA Continues on plavix  and asa with setia and rosuvastatin    Lytic lesions noted on imaging PET scan negative.  Can refer to oncology for further work up if interested  Return in about 3 months (around 10/01/2024) for routine follow up.  Jamaar Howes K. Caro BODILY [facility] [phone]

## 2024-07-01 NOTE — Patient Instructions (Signed)
 To start mirlax daily

## 2024-07-02 ENCOUNTER — Ambulatory Visit: Payer: Self-pay | Admitting: Nurse Practitioner

## 2024-07-02 LAB — COMPREHENSIVE METABOLIC PANEL WITH GFR
AG Ratio: 2.1 (calc) (ref 1.0–2.5)
ALT: 14 U/L (ref 6–29)
AST: 15 U/L (ref 10–35)
Albumin: 4.2 g/dL (ref 3.6–5.1)
Alkaline phosphatase (APISO): 84 U/L (ref 37–153)
BUN/Creatinine Ratio: 16 (calc) (ref 6–22)
BUN: 23 mg/dL (ref 7–25)
CO2: 31 mmol/L (ref 20–32)
Calcium: 9.4 mg/dL (ref 8.6–10.4)
Chloride: 105 mmol/L (ref 98–110)
Creat: 1.4 mg/dL — ABNORMAL HIGH (ref 0.60–0.95)
Globulin: 2 g/dL (ref 1.9–3.7)
Glucose, Bld: 110 mg/dL (ref 65–139)
Potassium: 4.9 mmol/L (ref 3.5–5.3)
Sodium: 143 mmol/L (ref 135–146)
Total Bilirubin: 1.4 mg/dL — ABNORMAL HIGH (ref 0.2–1.2)
Total Protein: 6.2 g/dL (ref 6.1–8.1)
eGFR: 38 mL/min/1.73m2 — ABNORMAL LOW (ref >=60)

## 2024-07-02 LAB — CBC WITH DIFFERENTIAL/PLATELET
Absolute Lymphocytes: 1529 {cells}/uL (ref 850–3900)
Absolute Monocytes: 421 {cells}/uL (ref 200–950)
Basophils Absolute: 49 {cells}/uL (ref 0–200)
Basophils Relative: 1 %
Eosinophils Absolute: 108 {cells}/uL (ref 15–500)
Eosinophils Relative: 2.2 %
HCT: 39.8 % (ref 35.9–46.0)
Hemoglobin: 12.8 g/dL (ref 11.7–15.5)
MCH: 29.2 pg (ref 27.0–33.0)
MCHC: 32.2 g/dL (ref 31.6–35.4)
MCV: 90.7 fL (ref 81.4–101.7)
MPV: 10.1 fL (ref 7.5–12.5)
Monocytes Relative: 8.6 %
Neutro Abs: 2793 {cells}/uL (ref 1500–7800)
Neutrophils Relative %: 57 %
Platelets: 161 Thousand/uL (ref 140–400)
RBC: 4.39 Million/uL (ref 3.80–5.10)
RDW: 13.5 % (ref 11.0–15.0)
Total Lymphocyte: 31.2 %
WBC: 4.9 Thousand/uL (ref 3.8–10.8)

## 2024-07-02 LAB — T4, FREE: Free T4: 1.5 ng/dL (ref 0.8–1.8)

## 2024-07-02 LAB — VALPROIC ACID LEVEL: Valproic Acid Lvl: 13.9 mg/L — ABNORMAL LOW (ref 50.0–100.0)

## 2024-07-02 LAB — TSH: TSH: 2.77 m[IU]/L (ref 0.40–4.50)

## 2024-07-03 ENCOUNTER — Telehealth: Payer: Self-pay

## 2024-07-03 NOTE — Telephone Encounter (Signed)
 Incoming fax received from patients pharmacy to initiate a prior authorization for  [employer]                  .  PA initiated through covermymeds. Key: [id number]  Awaiting reply from the insurance company which will be determined in 48-72 hours.

## 2024-07-12 ENCOUNTER — Encounter: Payer: Self-pay | Admitting: Pharmacist

## 2024-07-12 ENCOUNTER — Telehealth: Payer: Self-pay | Admitting: Pharmacist

## 2024-07-12 DIAGNOSIS — E7800 Pure hypercholesterolemia, unspecified: Secondary | ICD-10-CM

## 2024-07-12 NOTE — Progress Notes (Signed)
   07/12/2024  Patient ID: Deanna [name], female   DOB: 02/14/1941, 83 y.o.   MRN: [id number]  Pharmacy Quality Measure Review  This patient is appearing on a report for being at risk of failing the adherence measure for cholesterol (statin) medications this calendar year.   Medication: Rosuvastatin  40 mg  Last fill date: 08/07 for 90 day supply  Left voicemail for patient to return my call at their convenience.  Will PEND a refill to her Provider to be sent to her Pharmacy as she is out of refills.  Cassius DOROTHA Brought, PharmD, BCACP Clinical Pharmacist [phone]    \

## 2024-07-14 NOTE — Progress Notes (Unsigned)
   07/14/2024  Patient ID: Deanna [name], female   DOB: 29-Apr-1941, 83 y.o.   MRN: [id number]  Pharmacy Quality Measure Review  This patient is appearing on a report for being at risk of failing the adherence measure for cholesterol (statin) medications this calendar year.   Medication: Rosuvastatin  40 mg Last fill date: 08/25 for 90 day supply---Prescription is out of refills  Will collaborate with provider to facilitate refill needs.  Cassius DOROTHA Brought, PharmD, BCACP Clinical Pharmacist [phone]

## 2024-07-15 ENCOUNTER — Ambulatory Visit: Admitting: Adult Health

## 2024-07-15 ENCOUNTER — Ambulatory Visit: Payer: Self-pay | Admitting: *Deleted

## 2024-07-15 ENCOUNTER — Telehealth: Payer: Self-pay | Admitting: *Deleted

## 2024-07-15 ENCOUNTER — Telehealth: Payer: Self-pay | Admitting: Nurse Practitioner

## 2024-07-15 MED ORDER — ROSUVASTATIN CALCIUM 40 MG PO TABS: 40.0000 mg | ORAL_TABLET | Freq: Every day | ORAL | 3 refills | Status: AC

## 2024-07-15 NOTE — Telephone Encounter (Signed)
 Called and spoke with patient. The Cost is with the PA and they cannot afford this. Daughter is just wanting the medication to be changed to something different.   Please Advise.

## 2024-07-15 NOTE — Telephone Encounter (Signed)
 Copied from CRM [id number]. Topic: Appointments - Scheduling Inquiry for Clinic >> Jul 15, 2024 12:34 PM Debby BROCKS wrote: Reason for CRM: Patient cannot make it to 07/15/2024 appointment for infection due to the weather. They need to reschedule for as soon as possible but no dates until next week.  [name] is the daughter who called to reschedule:  [phone]

## 2024-07-15 NOTE — Telephone Encounter (Signed)
 FYI Only or Action Required?: Action required by provider: request for appointment.  Patient was last seen in primary care on 07/01/2024 by Caro [name] POUR, NP.  Called Nurse Triage reporting Vaginitis (Yeast- vulva to buttock).  Symptoms began several days ago.  Interventions attempted: Nothing.  Symptoms are: gradually worsening.  Triage Disposition: See Physician Within 24 Hours  Patient/caregiver understands and will follow disposition?: Yes- office will call her back to schedule   Copied from CRM #[id number]. Topic: Clinical - Red Word Triage >> Jul 15, 2024  8:47 AM Susanna ORN wrote: Red Word that prompted transfer to Nurse Triage: Patient's daughter, [name], calling in because she states her mother has a severe yeast infection. States she also has a head injury and she has had a stroke. She's states that she's had a rough time with her mother all this weekend due to the head injury and the after effects of the stroke. She's wanting to check if [name] Caro can prescribe her mom some oral medication for the yeast infection. Reason for Disposition  MODERATE-SEVERE itching (i.e., interferes with school, work, or sleep)  Answer Assessment - Initial Assessment Questions 1. SYMPTOM: What's the main symptom you're concerned about? (e.g., rash, itching, swelling, dryness)     Irritation of vulva to buttock 2. LOCATION: Where is the  irritation located? (e.g., inside/outside, left/right)     Vulva, groin, buttock 3. ONSET: When did the  yeast  start?     3 days 4. PAIN: Is there any pain? If Yes, ask: How bad is it? (Scale: 0-10; none, mild, moderate, severe)     Discomfort from itching 5. CAUSE: What do you think is causing the symptoms?     Yeast infection of skin 6. OTHER SYMPTOMS: Do you have any other symptoms? (e.g., fever, vaginal bleeding, pain with urination)     Confusion- worse with infection  Incontinent of urine- very hard to keep patient  dry  Protocols used: Vulvar Symptoms-A-AH

## 2024-07-15 NOTE — Telephone Encounter (Signed)
See Duplicate message 

## 2024-07-15 NOTE — Telephone Encounter (Signed)
 Patient's daughter is calling regarding instructions for assistance with [employer]. Cosy of medication is $1350- needs instructions for assistance.

## 2024-07-15 NOTE — Telephone Encounter (Signed)
 Tried to call daughter, LMOM to return call.

## 2024-07-15 NOTE — Telephone Encounter (Signed)
 Called [facility] [phone] and spoke with Pharmacist Penne.  Cash price is $4000. Insurance is paying but $1350 is OOP cost.   Please Advise.

## 2024-07-15 NOTE — Telephone Encounter (Signed)
 I had recommended her going online to the website- there ar resources and coupons on there. Also not sure if we need to do a prior auth for her also.  Can you look into that.  Thanks

## 2024-07-15 NOTE — Telephone Encounter (Signed)
 Copied from CRM [id number]. Topic: Clinical - Prescription Issue >> Jul 15, 2024  9:02 AM Deanna [name] ORN wrote: Reason for CRM: Patient's daughter, Deanna [name], called in stating that the pharmacy notified her that her mom's medication, Deanna [name]  (REXULTI ), is ready for pick up and the cost is $1350. Deanna stated that Deanna [name] applied for a grant to have the medication paid for and told her to call and let her know once the medication was ready. She wants to know what does she need to do so that she can pick the medication up and have it paid for? Please give her a call back to advise. CB #: [phone].

## 2024-07-15 NOTE — Telephone Encounter (Signed)
     Prior Authorization was submitted on 07/03/24 and APPROVED through 08/07/2025

## 2024-07-15 NOTE — Telephone Encounter (Signed)
 It is typically not that expensive for the medicare folks, can we call the pharmacy and see if theres something different we need to do on our end?

## 2024-07-15 NOTE — Telephone Encounter (Signed)
 Appointment for 12/8 canceled due to weather and rescheduled for 12/9 per daughter.

## 2024-07-15 NOTE — Telephone Encounter (Signed)
 Appointment scheduled for 07/15/2024

## 2024-07-15 NOTE — Telephone Encounter (Signed)
 Recommend going back on seroquel - would use 12.5 mg twice daily- once midafternoon and then 12.5 mg in the evenings to help with beheviors

## 2024-07-16 ENCOUNTER — Ambulatory Visit: Admitting: Adult Health

## 2024-07-16 ENCOUNTER — Encounter: Payer: Self-pay | Admitting: Adult Health

## 2024-07-16 VITALS — BP 132/86 | HR 94 | Temp 98.0°F

## 2024-07-16 DIAGNOSIS — G629 Polyneuropathy, unspecified: Secondary | ICD-10-CM | POA: Diagnosis not present

## 2024-07-16 DIAGNOSIS — F39 Unspecified mood [affective] disorder: Secondary | ICD-10-CM

## 2024-07-16 DIAGNOSIS — N390 Urinary tract infection, site not specified: Secondary | ICD-10-CM

## 2024-07-16 DIAGNOSIS — F03918 Unspecified dementia, unspecified severity, with other behavioral disturbance: Secondary | ICD-10-CM

## 2024-07-16 DIAGNOSIS — B372 Candidiasis of skin and nail: Secondary | ICD-10-CM

## 2024-07-16 DIAGNOSIS — F411 Generalized anxiety disorder: Secondary | ICD-10-CM

## 2024-07-16 DIAGNOSIS — Z8673 Personal history of transient ischemic attack (TIA), and cerebral infarction without residual deficits: Secondary | ICD-10-CM

## 2024-07-16 MED ORDER — FLUCONAZOLE 50 MG PO TABS: 50.0000 mg | ORAL_TABLET | ORAL | 0 refills | Status: DC

## 2024-07-16 MED ORDER — ALPRAZOLAM 0.5 MG PO TABS: 0.5000 mg | ORAL_TABLET | Freq: Every day | ORAL | 0 refills | Status: DC | PRN

## 2024-07-16 MED ORDER — NYSTATIN 100000 UNIT/GM EX OINT: 1.0000 | TOPICAL_OINTMENT | Freq: Two times a day (BID) | CUTANEOUS | 0 refills | Status: AC

## 2024-07-16 NOTE — Progress Notes (Unsigned)
 [facility] clinic  Provider:  [name]  Code Status:  Full Code  Goals of Care:     05/01/2024    2:21 PM  Advanced Directives  Does Patient Have a Medical Advance Directive? Yes  Type of Estate Agent of [place];Living will  Does patient want to make changes to medical advance directive? No - Patient declined  Copy of Healthcare Power of Attorney in Chart? No - copy requested     Chief Complaint  Patient presents with   Vaginitis   Foot Swelling    Bilateral. Lasix  does not seem to be helping    Urinary Tract Infection    Unclear if the behavioral changes are from a possible UTI or yeast infection  Discussed the use of AI scribe software for clinical note transcription with the patient, who gave verbal consent to proceed.   HPI: Patient is a 83 y.o. female seen today for an acute visit for     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   CKD (chronic kidney disease), stage IV (HCC)    Cognitive impairment    Colon polyps    Community acquired pneumonia    Depression    Diverticulosis    Dysuria    Encephalomalacia    GAD (generalized anxiety disorder)    GERD (gastroesophageal reflux disease)    Glaucoma    Hearing loss    Hemorrhoids    Hypertension    Hypothyroidism    Incontinence    Mixed hyperlipidemia    Myocardial infarction (HCC)    NSTEMI (non-ST elevated myocardial infarction) (HCC) 12/19/2011   OSA on CPAP    PAF (paroxysmal atrial fibrillation) (HCC)    Peripheral arterial disease    Pneumonitis 04/26/2015   Pyelonephritis due to Escherichia coli 04/28/2015   Sepsis (HCC)    Spondylosis of lumbar region  without myelopathy or radiculopathy    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HCC)    Urticaria    Vertigo, benign positional     Past Surgical History:  Procedure Laterality Date   ABDOMINAL AORTOGRAM W/LOWER EXTREMITY N/A 08/22/2017   Procedure: ABDOMINAL AORTOGRAM W/LOWER EXTREMITY;  Surgeon: Serene Gaile ORN, MD;  Location: [facility] INVASIVE CV LAB;  Service: Cardiovascular;  Laterality: N/A;   BILATERAL UPPER EXTREMITY ANGIOGRAM N/A 09/11/2012   Procedure: BILATERAL UPPER EXTREMITY ANGIOGRAM;  Surgeon: Gaile ORN Serene, MD;  Location: [facility] CATH LAB;  Service: Cardiovascular;  Laterality: N/A;   BIOPSY  03/25/2020   Procedure: BIOPSY;  Surgeon: Wilhelmenia Aloha Raddle., MD;  Location: [facility] ENDOSCOPY;  Service: Gastroenterology;;   carotid duplex doppler Bilateral 09/03/2012, 11/03/2011   Evidence of 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 patent left common carotid- subclavian artery bypass with turbulent flow noted at the anastomosis with velocities of 295 cm/s   CAROTID-SUBCLAVIAN BYPASS GRAFT  12/15/2011   Procedure: BYPASS GRAFT CAROTID-SUBCLAVIAN;  Surgeon: Gaile ORN Serene, MD;  Location: [facility] OR;  Service: Vascular;  Laterality: Left;  Left Carotid subclavian bypass   CORONARY ANGIOPLASTY     CORONARY ARTERY BYPASS GRAFT     CORONARY ATHERECTOMY N/A  03/19/2020   Procedure: CORONARY ATHERECTOMY;  Surgeon: Dann Candyce RAMAN, MD;  Location: [facility] INVASIVE CV LAB;  Service: Cardiovascular;  Laterality: N/A;   CORONARY STENT INTERVENTION N/A 03/19/2020   Procedure: CORONARY STENT INTERVENTION;  Surgeon: Dann Candyce RAMAN, MD;  Location: [facility] INVASIVE CV LAB;  Service: Cardiovascular;  Laterality: N/A;   DOPPLER ECHOCARDIOGRAPHY  05/27/2010, 09/17/2008   Mild Proximal septal thickening is noted. Left ventricular systolic functions is normal ejection fraction =>55%. the aortic  valve appears to be mildly sclerotic    ENDARTERECTOMY Right 12/13/2023   Procedure: ENDARTERECTOMY, CAROTID;  Surgeon: Serene Gaile ORN, MD;  Location: [facility] OR;  Service: Vascular;  Laterality: Right;   ESOPHAGOGASTRODUODENOSCOPY (EGD) WITH PROPOFOL  N/A 03/25/2020   Procedure: ESOPHAGOGASTRODUODENOSCOPY (EGD) WITH PROPOFOL ;  Surgeon: Wilhelmenia Aloha Raddle., MD;  Location: [facility] ENDOSCOPY;  Service: Gastroenterology;  Laterality: N/A;   fem-fem bypass graft  1999   holter monitor  01/21/2008   The predominant rhythm was normal sinus rhythm. Minimum heartrate of 63 bpm at +01:00, maximum heartrate of 105 bpm at + 10:35; and the average heartrate of 75 bpm. Ventricular ectopic activity totaled 1270: Multifocal; 866-PVC's and 404-VEs              JOINT REPLACEMENT     Left knee   LEFT HEART CATH AND CORS/GRAFTS ANGIOGRAPHY N/A 03/19/2020   Procedure: LEFT HEART CATH AND CORS/GRAFTS ANGIOGRAPHY;  Surgeon: Dann Candyce RAMAN, MD;  Location: [facility] INVASIVE CV LAB;  Service: Cardiovascular;  Laterality: N/A;   LEFT HEART CATHETERIZATION WITH CORONARY/GRAFT ANGIOGRAM N/A 12/21/2011   Procedure: LEFT HEART CATHETERIZATION WITH EL BILE;  Surgeon: Alm ORN Clay, MD;  Location: [facility] CATH LAB;  Service: Cardiovascular;  Laterality: N/A;   NM MYOCAR PERF EJECTION FRACTION  09/22/2009, 07/03/2007   the post stress myocardial perfusion images show a normal pattern of perfusion is all regions. The post-stress ejection fraction is 68 %. no significant wall motion abnormalities noted. This is a low risk scan.   PATCH ANGIOPLASTY Right 12/13/2023   Procedure: ANGIOPLASTY, USING PATCH GRAFT;  Surgeon: Serene Gaile ORN, MD;  Location: [facility] OR;  Service: Vascular;  Laterality: Right;   PERIPHERAL VASCULAR INTERVENTION  08/22/2017   Procedure: PERIPHERAL VASCULAR INTERVENTION;  Surgeon: Serene Gaile ORN, MD;  Location: [facility] INVASIVE CV LAB;  Service: Cardiovascular;;  Fem/Fem Graft   REPLACEMENT TOTAL KNEE  05-2011    UNILATERAL UPPER EXTREMEITY ANGIOGRAM Left 11/15/2011   Procedure: UNILATERAL UPPER VENITA BILE;  Surgeon: Dorn JINNY Lesches, MD;  Location: [facility] CATH LAB;  Service: Cardiovascular;  Laterality: Left;    Allergies  Allergen Reactions   Lidocaine  Swelling and Other (See Comments)    Use CORRECT dose for age/weight- red welts and tongue swelling result, if not   Cymbalta  [Duloxetine  Hcl] Other (See Comments)    Increased confusion and memory concerns    Donepezil Other (See Comments)    Altered mood and Aggression     Mobic [Meloxicam] Other (See Comments)    Heart failure- cannot take   Namenda [Memantine] Other (See Comments)    Severe aggression   Nsaids Other (See Comments)    Cannot have due to heart issues   Other Other (See Comments)    No salt or anything that might cause fluid retention/swelling!!   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, however(??)   Vicodin [Hydrocodone-Acetaminophen ] Other (See Comments)    Delirium, Confusion, and Toxic dementia  Outpatient Encounter Medications as of 07/16/2024  Medication Sig   acetaminophen  (TYLENOL ) 325 MG tablet Take 2 tablets (650 mg total) by mouth every 4 (four) hours as needed for mild pain (pain score 1-3), fever, moderate pain (pain score 4-6) or headache (or Fever >/= 101).   albuterol  (VENTOLIN  HFA) 108 (90 Base) MCG/ACT inhaler Inhale 2 puffs into the lungs every 6 (six) hours as needed for wheezing or shortness of breath.   ALPRAZolam  (XANAX ) 0.5 MG tablet Take 1 tablet (0.5 mg total) by mouth daily as needed for anxiety.   aspirin  EC 81 MG tablet Take 81 mg by mouth at bedtime.   Brexpiprazole  (REXULTI ) 0.5 MG TABS Take 1 tablet (0.5 mg total) by mouth daily for 7 days, THEN 2 tablets (1 mg total) daily for 7 days, THEN 4 tablets (2 mg total) daily for 14 days.   brexpiprazole  (REXULTI ) 2 MG TABS tablet Take 1 tablet (2 mg total) by mouth daily.    busPIRone  (BUSPAR ) 15 MG tablet Take 1 tablet (15 mg total) by mouth 2 (two) times daily.   clopidogrel  (PLAVIX ) 75 MG tablet Take 75 mg by mouth daily.   divalproex  (DEPAKOTE  ER) 250 MG 24 hr tablet Take 1 tablet (250 mg total) by mouth at bedtime.   dorzolamide  (TRUSOPT ) 2 % ophthalmic solution Place 1 drop into both eyes in the morning and at bedtime.   escitalopram  (LEXAPRO ) 20 MG tablet Take 1 tablet (20 mg total) by mouth daily. (Patient taking differently: Take 20 mg by mouth daily at 12 noon.)   ezetimibe  (ZETIA ) 10 MG tablet TAKE 1 TABLET(10 MG) BY MOUTH DAILY (Patient taking differently: Take 10 mg by mouth in the morning.)   furosemide  (LASIX ) 40 MG tablet Take 1 tablet (40 mg total) by mouth daily as needed for fluid or edema.   gabapentin  (NEURONTIN ) 100 MG capsule Take 200 mg by mouth daily at 12 noon.   latanoprost  (XALATAN ) 0.005 % ophthalmic solution Place 1 drop into both eyes at bedtime.   levothyroxine  (SYNTHROID ) 175 MCG tablet Take 1 tablet (175 mcg total) by mouth daily at 6 (six) AM.   metoprolol  succinate (TOPROL -XL) 25 MG 24 hr tablet Take 1 tablet (25 mg total) by mouth daily. (Patient taking differently: Take 25 mg by mouth at bedtime.)   nitroGLYCERIN  (NITROSTAT ) 0.4 MG SL tablet Place 0.4 mg under the tongue every 5 (five) minutes as needed for chest pain.   pantoprazole  (PROTONIX ) 40 MG tablet Take 1 tablet (40 mg total) by mouth daily. (Patient taking differently: Take 40 mg by mouth daily before breakfast.)   rosuvastatin  (CRESTOR ) 40 MG tablet Take 1 tablet (40 mg total) by mouth daily.   No facility-administered encounter medications on file as of 07/16/2024.    Review of Systems:  Review of Systems  Constitutional:  Negative for appetite change, chills, fatigue and fever.  HENT:  Negative for congestion, hearing loss, rhinorrhea and sore throat.   Eyes: Negative.   Respiratory:  Negative for cough, shortness of breath and wheezing.   Cardiovascular:   Negative for chest pain, palpitations and leg swelling.  Gastrointestinal:  Negative for abdominal pain, constipation, diarrhea, nausea and vomiting.  Genitourinary:  Negative for dysuria.  Musculoskeletal:  Negative for arthralgias, back pain and myalgias.  Skin:  Negative for color change, rash and wound.  Neurological:  Negative for dizziness, weakness and headaches.  Psychiatric/Behavioral:  Negative for behavioral problems. The patient is not nervous/anxious.     Health Maintenance  Topic Date Due   Zoster Vaccines- Shingrix (2 of 2) 08/17/2016   Medicare Annual Wellness (AWV)  05/17/2023   COVID-19 Vaccine (4 - 2025-26 season) 07/17/2024 (Originally 04/08/2024)   DTaP/Tdap/Td (1 - Tdap) 07/01/2025 (Originally 11/23/1959)   Pneumococcal Vaccine: 50+ Years  Completed   Influenza Vaccine  Completed   Bone Density Scan  Completed   Meningococcal B Vaccine  Aged Out   Colonoscopy  Discontinued   Hepatitis C Screening  Discontinued    Physical Exam: Vitals:   07/16/24 1356  BP: 132/86  Pulse: 94  Temp: 98 F (36.7 C)  TempSrc: Temporal  SpO2: 96%   There is no height or weight on file to calculate BMI. Physical Exam Constitutional:      Appearance: Normal appearance.  HENT:     Head: Normocephalic and atraumatic.     Nose: Nose normal.     Mouth/Throat:     Mouth: Mucous membranes are moist.  Eyes:     Conjunctiva/sclera: Conjunctivae normal.  Cardiovascular:     Rate and Rhythm: Normal rate and regular rhythm.  Pulmonary:     Effort: Pulmonary effort is normal.     Breath sounds: Normal breath sounds.  Abdominal:     General: Bowel sounds are normal.     Palpations: Abdomen is soft.  Musculoskeletal:        General: Normal range of motion.     Cervical back: Normal range of motion.  Skin:    General: Skin is warm and dry.     Findings: Rash present.     Comments: Erythematous rashes on her buttock cleft, bilateral groin and bilateral breast  Neurological:      General: No focal deficit present.     Mental Status: She is alert and oriented to person, place, and time.     Comments: Has left facial droop   Psychiatric:        Mood and Affect: Mood normal.        Behavior: Behavior normal.        Thought Content: Thought content normal.        Judgment: Judgment normal.     Labs reviewed: Basic Metabolic Panel: Recent Labs    12/21/23 0258 02/06/24 1544 03/29/24 1739 06/11/24 0742 06/12/24 0442 06/13/24 0500 07/01/24 1449  NA 138 141   < > 142 139 140 143  K 4.1 4.1   < > 4.3 4.6 4.9 4.9  CL 98 103   < > 104 104 102 105  CO2 31 26   < > 29 27 30 31   GLUCOSE 113* 97   < > 96 100* 94 110  BUN 19 20   < > 18 21 20 23   CREATININE 1.54* 1.43*   < > 1.46* 1.70* 1.61* 1.40*  CALCIUM  9.1 10.1   < > 9.3 8.8* 9.4 9.4  MG 1.6*  --   --  2.2  --  2.3  --   PHOS 2.8  --   --   --   --   --   --   TSH  --  1.71  --  25.100*  --   --  2.77   < > = values in this interval not displayed.   Liver Function Tests: Recent Labs    03/29/24 1739 04/23/24 1954 06/10/24 1748 07/01/24 1449  AST 22 20 19 15   ALT 22 8 16 14   ALKPHOS 71 104 95  --   BILITOT 1.4* 1.0  1.0 1.4*  PROT 6.4* 6.9 6.6 6.2  ALBUMIN  3.7 4.2 3.8  --    Recent Labs    03/29/24 1739  LIPASE 36   Recent Labs    12/18/23 1018 06/10/24 1747  AMMONIA 39* 23   CBC: Recent Labs    02/06/24 1544 03/29/24 1739 06/10/24 1748 06/11/24 0742 06/12/24 0442 07/01/24 1449  WBC 6.8   < > 8.3 6.3 5.3 4.9  NEUTROABS 4,570  --  6.6  --   --  2,793  HGB 13.7   < > 12.9 12.3 11.2* 12.8  HCT 43.5   < > 41.0 39.0 37.9 39.8  MCV 87.5   < > 88.6 90.5 91.1 90.7  PLT 170   < > 211 175 179 161   < > = values in this interval not displayed.   Lipid Panel: Recent Labs    12/14/23 0424  CHOL 73  HDL 22*  LDLCALC 29  TRIG 890  CHOLHDL 3.3   Lab Results  Component Value Date   HGBA1C 5.3 12/18/2023    Procedures since last visit: No results  found.  Assessment/Plan Assessment and Plan Assessment & Plan     ***   Labs/tests ordered:  * No order type specified *   No follow-ups on file.  Deanna Klute Medina-Vargas, NP

## 2024-07-16 NOTE — Telephone Encounter (Signed)
 Left message on voicemail for patient to return call when available

## 2024-07-17 MED ORDER — QUETIAPINE FUMARATE 25 MG PO TABS: 12.5000 mg | ORAL_TABLET | Freq: Two times a day (BID) | ORAL | 1 refills | Status: DC

## 2024-07-17 NOTE — Telephone Encounter (Signed)
 RX pending for provider to review (confirm dose and instructions are correct) and sign

## 2024-07-17 NOTE — Telephone Encounter (Signed)
 New rx sent to pharmacy

## 2024-07-19 ENCOUNTER — Ambulatory Visit: Admitting: Physician Assistant

## 2024-07-19 ENCOUNTER — Encounter: Payer: Self-pay | Admitting: Physician Assistant

## 2024-07-19 VITALS — BP 114/69 | HR 84 | Ht 61.5 in | Wt 175.0 lb

## 2024-07-19 DIAGNOSIS — F0280 Dementia in other diseases classified elsewhere without behavioral disturbance: Secondary | ICD-10-CM

## 2024-07-19 DIAGNOSIS — F03918 Unspecified dementia, unspecified severity, with other behavioral disturbance: Secondary | ICD-10-CM

## 2024-07-19 DIAGNOSIS — G309 Alzheimer's disease, unspecified: Secondary | ICD-10-CM

## 2024-07-19 NOTE — Patient Instructions (Signed)
 No follow up is indicated Recommend memory care Follow up with psychiatry for medications for mood.

## 2024-07-19 NOTE — Progress Notes (Signed)
 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 For mood she is on multiple agents including Seroquel , escitalopram , Depakote  by PCP.  She had a recent hospitalization for confusion in September 2025 requiring psychiatric involvement.  Patient needs more assistance with ADLs than prior including feeding, bathing and dressing. Unfortunately, cognitive decline is evident, unable to recognize her daughter who is here with her during this visit. Patient is no longer on antidementia medications given progression of her cognitive status.  Discussed with her daughter the role of memory care facility for social, cognitive stimulation and for 24/7 monitoring for safety, may need Social Work involvement for guidance and counseling for resources available to her.   Recommend to control mood. She is on multiple agents including Seroquel , escitalopram , Depakote , agree with psychiatric involvement for medication management   Continue to control cardiovascular risk factors aspirin  and Plavix  for secondary stroke prevention Follow-up left eye blindness ophthalmology Recommend Memory Care facility for social and cognitive stimulation, 24/7 monitoring for safety.  No follow up is indicated. Information and tips provided  Recommend contacting Elder Law for any legal questions and advanced planning.   Discussed the use of AI scribe software for clinical note transcription with  the patient, who gave verbal consent to proceed.  History of Present Illness   Since her last visit in May 2025, she has experienced worsening memory issues and behavioral changes. She intermittently does not recognize her daughter and has called the police during an episode of confusion. According to her caregiver, she becomes more confused and anxious starting around 1 PM and this continues until she goes to bed.  She had a carotid bypass in September 2025 and subsequently experienced a stroke in the same location as a previous head injury. Since then, she has been hospitalized multiple times and has become increasingly anxious, requiring constant reassurance of her caregiver's presence. She has exhibited behaviors such as leaving the house to seek help from neighbors, claiming her caregiver is trying to harm her. Her current medications include Seroquel , Lexapro , and Depakote . Attempts to use Rexulti  were unsuccessful due to cost. She has previously been on Risperdal , Ativan , Geodon , and Haldol  during hospitalizations. A psychiatry follow up is being set up for medication management  She experiences significant confusion about her surroundings, often not recognizing her home or caregiver. She has difficulty with sleep, refusing to sleep in bed due to discomfort from a bruised tailbone and instead sleeps in a chair with lights on, disrupting her circadian rhythm. She wakes up early, unable to tell time due to poor vision, having lost vision in her left eye.  She talks in her sleep and requires prompting for personal hygiene tasks. Her appetite is good, but she forgets meals and may eat again shortly after. She experiences anxiety-related choking sensations but no actual swallowing difficulties.  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 She has a history of recurrent UTIs and yeast infections,  with current yeast infection  exacerbating her confusion. She is incontinent and requires reminders to use the bathroom. She experiences chronic constipation, managed with Miralax  every other day.  She has congestive heart failure and experiences swollen feet and legs, which are managed by elevating her legs.      CT of the head without contrast November 2025, remarkable for generalized cerebral atrophy including hippocampal atrophy, microvascular disease changes of the supratentorial brain, chronic small bilateral frontal lobe white matter infarcts, no acute intracranial abnormality  PREVIOUS MEDICATIONS: Donepezil, memantine     Objective:     PHYSICAL EXAMINATION:    VITALS:   Vitals:   07/19/24 1118  BP: 114/69  Pulse: 84  Weight: 175 lb (79.4 kg)  Height: 5' 1.5 (1.562 m)    GEN:  The patient appears stated age and is in NAD but very anxious appearing . HEENT:  Normocephalic, atraumatic.        11/20/2018   12:00 PM  Montreal Cognitive Assessment   Visuospatial/ Executive (0/5) 0  Naming (0/3) 0  Attention: Read list of digits (0/2) 2  Attention: Read list of letters (0/1) 1  Attention: Serial 7 subtraction starting at 100 (0/3) 2  Language: Repeat phrase (0/2) 1  Language : Fluency (0/1) 0  Abstraction (0/2) 0  Delayed Recall (0/5) 2  Orientation (0/6) 5  Total 13       06/15/2021    8:00 PM 03/07/2018   11:00 AM 05/30/2014   11:22 AM  MMSE - Mini Mental State Exam  Orientation to time 3 5 5    Orientation to Place 4 5 5    Registration 3 3 3    Attention/ Calculation 4 5 5    Recall 3 2 2    Language- name 2 objects 2 2 2    Language- repeat 1 1 1   Language- follow 3 step command 3 3 3    Language- read & follow direction 1 1 1    Write a sentence 1 1 1    Copy design 1 1 1    Total score 26 29 29       Data saved with a previous flowsheet row definition        Neurological examination:  Orientation: The patient is alert.  Not oriented to person, place and date. Does not remember  her daughter's name who is with her in the room. Cranial nerves: Mild chronic left facial droop.The speech is fluent and clear, very repetitive. No aphasia or dysarthria. Fund of knowledge is reduced. Recent memory and remote memory impaired.  Attention and concentration are reduced.  Able to name objects and unable to repeat phrases.  Hearing is intact to conversational tone.  Sensation: She is left eye blind, right eye normal fundi not visualized.  Sensation is intact to light touch throughout Motor: Strength is at least antigravity x4. DTR's 2/4 in UE/LE   Movement examination: Tone: There is normal tone in the UE/LE. No cogwheeling Abnormal movements: Chronic L>R resting tremor, no intention tremor.  No myoclonus.  No asterixis.   Coordination:  There is no decremation with RAM's. Normal finger to nose  Gait and Station: unable to test gait due to instability, she is on a wheelchair today   Thank you for allowing us  the opportunity to participate in the care of this nice patient. Please do not hesitate to contact us  for any questions or concerns.   Total time spent on today's visit was 41 minutes dedicated to this patient today, preparing to see patient, examining  the patient, ordering tests and/or medications and counseling the patient, documenting clinical information in the EHR or other health record, independently interpreting results and communicating results to the patient/family, discussing treatment and goals, answering patient's questions and coordinating care.  Cc:  Caro [name] POUR, NP  Camie Sevin 07/19/2024 12:25 PM

## 2024-07-22 LAB — POC URINALSYSI DIPSTICK (AUTOMATED)
Bilirubin, UA: NEGATIVE
Blood, UA: NEGATIVE
Glucose, UA: NEGATIVE
Ketones, UA: NEGATIVE
Leukocytes, UA: NEGATIVE
Nitrite, UA: NEGATIVE
Protein, UA: POSITIVE — AB
Spec Grav, UA: 1.01 (ref 1.010–1.025)
Urobilinogen, UA: 0.2 U/dL
pH, UA: 6 (ref 5.0–8.0)

## 2024-07-24 ENCOUNTER — Telehealth: Payer: Self-pay | Admitting: Nurse Practitioner

## 2024-07-24 DIAGNOSIS — F03918 Unspecified dementia, unspecified severity, with other behavioral disturbance: Secondary | ICD-10-CM

## 2024-07-24 NOTE — Telephone Encounter (Signed)
 Please see below.   Copied from CRM #[id number]. Topic: Clinical - Medication Question >> Jul 24, 2024  4:14 PM DeAngela L wrote: Reason for CRM: Patient daughter Bruno calling to inform the provider the patient has been diagnosed with Alzheimer and the neurologist Dr Camie FORBES Sevin, PA-C that diagnosed told the daughter that the patient needs to be in Memory Care with 24 hour help and to contact the Ms. [name] MARLA An, NP office for assistance from a Social worker to help her locate a memory care location and how to get the ball rolling since the patient is up at night and she concerned about her safety and states it gotten bad quickly  Pt daughter Bruno hermanns Mobile [phone]

## 2024-07-25 NOTE — Telephone Encounter (Signed)
 Referral for SW placed Once she finds facility we can complete FL2 form

## 2024-08-11 ENCOUNTER — Other Ambulatory Visit: Payer: Self-pay | Admitting: Adult Health

## 2024-08-11 DIAGNOSIS — F411 Generalized anxiety disorder: Secondary | ICD-10-CM

## 2024-08-12 ENCOUNTER — Other Ambulatory Visit: Payer: Self-pay | Admitting: Adult Health

## 2024-08-12 DIAGNOSIS — F411 Generalized anxiety disorder: Secondary | ICD-10-CM

## 2024-08-12 DIAGNOSIS — F39 Unspecified mood [affective] disorder: Secondary | ICD-10-CM

## 2024-08-12 NOTE — Telephone Encounter (Signed)
 High risk warning

## 2024-08-12 NOTE — Telephone Encounter (Signed)
 Will discuss at appt

## 2024-08-12 NOTE — Telephone Encounter (Signed)
 Patient has request for refill on 08/13/23. Patient last refill was 07/16/24. Patient has contract on file dated 07/16/24. Patient has upcoming appointment 08/17/23 Medication pend and sent to PCP Arch, Jessica K, NP) for approval.

## 2024-08-16 ENCOUNTER — Telehealth: Payer: Self-pay

## 2024-08-16 ENCOUNTER — Ambulatory Visit: Admitting: Nurse Practitioner

## 2024-08-16 ENCOUNTER — Encounter: Payer: Self-pay | Admitting: Nurse Practitioner

## 2024-08-16 VITALS — BP 150/80 | HR 85 | Temp 97.3°F | Ht 61.5 in | Wt 173.0 lb

## 2024-08-16 DIAGNOSIS — F03918 Unspecified dementia, unspecified severity, with other behavioral disturbance: Secondary | ICD-10-CM | POA: Diagnosis not present

## 2024-08-16 DIAGNOSIS — T148XXA Other injury of unspecified body region, initial encounter: Secondary | ICD-10-CM | POA: Diagnosis not present

## 2024-08-16 DIAGNOSIS — E039 Hypothyroidism, unspecified: Secondary | ICD-10-CM | POA: Diagnosis not present

## 2024-08-16 DIAGNOSIS — B372 Candidiasis of skin and nail: Secondary | ICD-10-CM | POA: Diagnosis not present

## 2024-08-16 DIAGNOSIS — R600 Localized edema: Secondary | ICD-10-CM | POA: Diagnosis not present

## 2024-08-16 MED ORDER — FLUCONAZOLE 100 MG PO TABS: ORAL_TABLET | ORAL | 0 refills | Status: AC

## 2024-08-16 MED ORDER — CLOPIDOGREL BISULFATE 75 MG PO TABS: 75.0000 mg | ORAL_TABLET | Freq: Every day | ORAL | 1 refills | Status: AC

## 2024-08-16 MED ORDER — NYSTATIN 100000 UNIT/GM EX POWD: 1.0000 | Freq: Three times a day (TID) | CUTANEOUS | 0 refills | Status: AC

## 2024-08-16 NOTE — Patient Instructions (Addendum)
" °  2.) Visit your local pharmacy if you would like to receive additional covid booster in the future    "

## 2024-08-16 NOTE — Progress Notes (Unsigned)
 "   Careteam: Patient Care Team: Caro [name] POUR, NP as PCP - General (Geriatric Medicine) Court Dorn PARAS, MD as PCP - Cardiology (Cardiology) Abran Norleen SAILOR, MD as Consulting Physician (Gastroenterology) Liam Lerner, MD as Consulting Physician (Orthopedic Surgery) Roz Anes, MD as Consulting Physician (Ophthalmology) Gaston Hamilton, MD as Consulting Physician (Urology) Serene Gaile ORN, MD as Consulting Physician (Vascular Surgery) Georjean Darice HERO, MD as Consulting Physician (Neurology) Raj Rankin SAUNDERS, MD as Referring Physician (Ophthalmology) Franchot [name] SQUIBB, PMHNP as Nurse Practitioner (Psychiatry) Georjean Darice HERO, MD as Consulting Physician (Neurology)  PLACE OF SERVICE:  [facility]  Advanced Directive information Does Patient Have a Medical Advance Directive?: Yes, Type of Advance Directive: Healthcare Power of [place], Does patient want to make changes to medical advance directive?: No - Patient declined  Allergies[1]  Chief Complaint  Patient presents with   Skin Problem    Skin irritation (yeast), under left breast. Patient with recent fall on Monday 08/12/24 and injured nose (high fall risk). Here with daughter Bruno    Medication Problem    Patient having trouble getting of levothyroxine  instead taking   Labs Only    Discuss drawing labs for triad psych     HPI:  Discussed the use of AI scribe software for clinical note transcription with the patient, who gave verbal consent to proceed.  History of Present Illness Deanna [name] is an 84 year old female who presents with a persistent yeast infection under her breast and recent falls.  She has a persistent yeast infection under her left breast, with itching and burning sensations. Previous treatments included fluconazole  once a week for four weeks and niacinamide cream, but the rash has not fully resolved.  She has experienced recent falls, including one four days ago during which she  injured her nose and skinned her knee. She did not seek emergency care. She has a history of falling out of her chair, which she uses for sleeping due to tailbone pain when lying in bed.  She has been experiencing issues with her Synthroid  medication, consistently receiving a 150 mcg dose instead of the prescribed 175 mcg. There was a period around Christmas when she did not take her medication for four to five days due to pharmacy issues.  Her medication regimen has recently changed following a psychiatric evaluation. She was taken off Seroquel , buspirone , and Xanax , and started on Risperdal  1.25 mg twice daily and smaller doses of alprazolam  more frequently.    Review of Systems:  Review of Systems  Unable to perform ROS: Dementia    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   CKD (chronic kidney disease), stage IV (HCC)    Cognitive impairment    Colon polyps    Community acquired pneumonia    Depression    Diverticulosis    Dysuria    Encephalomalacia    GAD (generalized anxiety disorder)    GERD (gastroesophageal reflux disease)    Glaucoma    Hearing loss    Hemorrhoids    Hypertension    Hypothyroidism    Incontinence    Mixed hyperlipidemia    Myocardial infarction [facility])    NSTEMI (non-ST elevated  myocardial infarction) (HCC) 12/19/2011   OSA on CPAP    PAF (paroxysmal atrial fibrillation) (HCC)    Peripheral arterial disease    Pneumonitis 04/26/2015   Pyelonephritis due to Escherichia coli 04/28/2015   Sepsis (HCC)    Spondylosis of lumbar region without myelopathy or radiculopathy    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HCC)    Urticaria    Vertigo, benign positional     Past Surgical History:  Procedure Laterality Date   ABDOMINAL AORTOGRAM W/LOWER EXTREMITY N/A 08/22/2017   Procedure: ABDOMINAL AORTOGRAM W/LOWER EXTREMITY;  Surgeon: Serene Gaile ORN, MD;  [facility]: [facility] INVASIVE CV LAB;  Service: Cardiovascular;  Laterality: N/A;   BILATERAL UPPER EXTREMITY ANGIOGRAM N/A 09/11/2012   Procedure: BILATERAL UPPER EXTREMITY ANGIOGRAM;  Surgeon: Gaile ORN Serene, MD;  Location: [facility] CATH LAB;  Service: Cardiovascular;  Laterality: N/A;   BIOPSY  03/25/2020   Procedure: BIOPSY;  Surgeon: Wilhelmenia Aloha Raddle., MD;  Location: [facility] ENDOSCOPY;  Service: Gastroenterology;;   carotid duplex doppler Bilateral 09/03/2012, 11/03/2011   Evidence of 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 patent left common carotid- subclavian artery bypass with turbulent flow noted at the anastomosis with velocities of 295 cm/s   CAROTID-SUBCLAVIAN BYPASS GRAFT  12/15/2011   Procedure: BYPASS GRAFT CAROTID-SUBCLAVIAN;  Surgeon: Gaile ORN Serene, MD;  Location: [facility] OR;  Service: Vascular;  Laterality: Left;  Left Carotid subclavian bypass   CORONARY ANGIOPLASTY     CORONARY ARTERY BYPASS GRAFT     CORONARY ATHERECTOMY N/A 03/19/2020   Procedure: CORONARY ATHERECTOMY;  Surgeon: Dann Candyce RAMAN, MD;  Location: [facility] INVASIVE CV LAB;  Service: Cardiovascular;  Laterality: N/A;   CORONARY STENT INTERVENTION N/A 03/19/2020   Procedure: CORONARY STENT INTERVENTION;  Surgeon: Dann Candyce RAMAN, MD;  Location: [facility] INVASIVE CV LAB;  Service: Cardiovascular;  Laterality: N/A;   DOPPLER ECHOCARDIOGRAPHY  05/27/2010, 09/17/2008   Mild Proximal septal thickening is noted. Left ventricular systolic functions is normal ejection fraction =>55%. the aortic valve appears to be mildly sclerotic    ENDARTERECTOMY Right 12/13/2023   Procedure: ENDARTERECTOMY, CAROTID;  Surgeon:  Serene Gaile ORN, MD;  Location: [facility] OR;  Service: Vascular;  Laterality: Right;   ESOPHAGOGASTRODUODENOSCOPY (EGD) WITH PROPOFOL  N/A 03/25/2020   Procedure: ESOPHAGOGASTRODUODENOSCOPY (EGD) WITH PROPOFOL ;  Surgeon: Wilhelmenia Aloha Raddle., MD;  Location: [facility] ENDOSCOPY;  Service: Gastroenterology;  Laterality: N/A;   fem-fem bypass graft  1999   holter monitor  01/21/2008   The predominant rhythm was normal sinus rhythm. Minimum heartrate of 63 bpm at +01:00, maximum heartrate of 105 bpm at + 10:35; and the average heartrate of 75 bpm. Ventricular ectopic activity totaled 1270: Multifocal; 866-PVC's and 404-VEs              JOINT REPLACEMENT     Left knee   LEFT HEART CATH AND CORS/GRAFTS ANGIOGRAPHY N/A 03/19/2020   Procedure: LEFT HEART CATH AND CORS/GRAFTS ANGIOGRAPHY;  Surgeon: Dann Candyce RAMAN, MD;  Location: [facility] INVASIVE CV LAB;  Service: Cardiovascular;  Laterality: N/A;   LEFT HEART CATHETERIZATION WITH CORONARY/GRAFT ANGIOGRAM N/A 12/21/2011   Procedure: LEFT HEART CATHETERIZATION WITH EL BILE;  Surgeon: Alm ORN Clay, MD;  Location: [facility] CATH LAB;  Service: Cardiovascular;  Laterality: N/A;   NM MYOCAR PERF EJECTION FRACTION  09/22/2009, 07/03/2007   the post stress myocardial perfusion images show a normal pattern of perfusion is all regions. The post-stress ejection fraction is 68 %.  no significant wall motion abnormalities noted. This is a low risk scan.   PATCH ANGIOPLASTY Right 12/13/2023   Procedure: ANGIOPLASTY, USING PATCH GRAFT;  Surgeon: Serene Gaile ORN, MD;  Location[facility] OR;  Service: Vascular;  Laterality: Right;   PERIPHERAL VASCULAR INTERVENTION  08/22/2017   Procedure: PERIPHERAL VASCULAR INTERVENTION;  Surgeon: Serene Gaile ORN, MD;  Location: [facility] INVASIVE CV LAB;  Service: Cardiovascular;;  Fem/Fem Graft   REPLACEMENT TOTAL KNEE  05-2011   UNILATERAL UPPER EXTREMEITY ANGIOGRAM Left 11/15/2011   Procedure: UNILATERAL UPPER VENITA BILE;  Surgeon: Dorn JINNY Lesches, MD;  Location: [facility] CATH LAB;  Service: Cardiovascular;  Laterality: Left;   Social History:   reports that she quit smoking about 42 years ago. Her smoking use included cigarettes. She started smoking about 45 years ago. She has never used smokeless tobacco. She reports that she does not currently use alcohol. She reports that she does not use drugs.  Family History  Problem Relation Age of Onset   Heart attack Mother    Heart disease Mother        before age 37   Diabetes Father    Heart disease Father    Hypertension Father    Hyperlipidemia Father    Heart attack Father 24   Heart attack Brother 34   Cerebral palsy Sister 38   Congestive Heart Failure Sister 62   Heart attack Sister 94   Hypertension Sister 60   Dementia Sister    Anxiety disorder Sister    Anxiety disorder Sister 33   Heart Problems Sister    Stroke Sister    Heart Problems Sister 26   Heart attack Sister 26   Colon cancer Brother 19   Prostate cancer Brother 55   Hypertension Daughter    Irritable bowel syndrome Daughter    Depression Daughter    Anxiety disorder Daughter     Medications: Patient's Medications  New Prescriptions   No medications on file  Previous Medications   ACETAMINOPHEN  (TYLENOL ) 325 MG TABLET    Take 2 tablets (650 mg total) by mouth every 4 (four) hours as needed for mild pain (pain score 1-3), fever, moderate pain (pain score 4-6) or headache (or Fever >/= 101).   ALBUTEROL  (VENTOLIN  HFA) 108 (90 BASE) MCG/ACT INHALER    Inhale 2 puffs into the lungs every 6 (six) hours as needed for wheezing or shortness of breath.   ALPRAZOLAM  (XANAX ) 0.25 MG TABLET    Take 0.25 mg by mouth 3 (three) times daily.   ALPRAZOLAM  (XANAX ) 0.5 MG TABLET    Take 1 tablet (0.5 mg total) by mouth daily as needed for anxiety.   ASPIRIN  EC 81 MG TABLET    Take 81 mg by mouth at bedtime.   BUSPIRONE  (BUSPAR ) 15 MG TABLET    Take 1 tablet (15 mg total) by mouth 2 (two) times daily.   CLOPIDOGREL   (PLAVIX ) 75 MG TABLET    Take 75 mg by mouth daily.   DIVALPROEX  (DEPAKOTE  ER) 250 MG 24 HR TABLET    Take 1 tablet (250 mg total) by mouth at bedtime.   DORZOLAMIDE  (TRUSOPT ) 2 % OPHTHALMIC SOLUTION    Place 1 drop into both eyes in the morning and at bedtime.   ESCITALOPRAM  (LEXAPRO ) 20 MG TABLET    TAKE 1 TABLET(20 MG) BY MOUTH DAILY   EZETIMIBE  (ZETIA ) 10 MG TABLET    TAKE 1 TABLET(10 MG) BY MOUTH DAILY   FUROSEMIDE  (LASIX ) 40 MG TABLET  Take 1 tablet (40 mg total) by mouth daily as needed for fluid or edema.   GABAPENTIN  (NEURONTIN ) 100 MG CAPSULE    Take 200 mg by mouth daily at 12 noon.   LATANOPROST  (XALATAN ) 0.005 % OPHTHALMIC SOLUTION    Place 1 drop into both eyes at bedtime.   LEVOTHYROXINE  (SYNTHROID ) 150 MCG TABLET    Take 150 mcg by mouth daily before breakfast.   LEVOTHYROXINE  (SYNTHROID ) 175 MCG TABLET    Take 1 tablet (175 mcg total) by mouth daily at 6 (six) AM.   METOPROLOL  SUCCINATE (TOPROL -XL) 25 MG 24 HR TABLET    Take 1 tablet (25 mg total) by mouth daily.   NITROGLYCERIN  (NITROSTAT ) 0.4 MG SL TABLET    Place 0.4 mg under the tongue every 5 (five) minutes as needed for chest pain.   PANTOPRAZOLE  (PROTONIX ) 40 MG TABLET    Take 1 tablet (40 mg total) by mouth daily.   QUETIAPINE  (SEROQUEL ) 25 MG TABLET    TAKE 1 TABLET BY MOUTH AT BEDTIME   RISPERIDONE  (RISPERDAL ) 0.25 MG TABLET    Take 0.25 mg by mouth 2 (two) times daily.   ROSUVASTATIN  (CRESTOR ) 40 MG TABLET    Take 1 tablet (40 mg total) by mouth daily.  Modified Medications   No medications on file  Discontinued Medications   No medications on file    Physical Exam:  Vitals:   08/16/24 0921  BP: (!) 150/80  Pulse: 85  Temp: (!) 97.3 F (36.3 C)  SpO2: 99%  Weight: 173 lb (78.5 kg)  Height: 5' 1.5 (1.562 m)   Body mass index is 32.16 kg/m. Wt Readings from Last 3 Encounters:  08/16/24 173 lb (78.5 kg)  07/19/24 175 lb (79.4 kg)  07/01/24 173 lb 12.8 oz (78.8 kg)    Physical Exam Constitutional:       General: She is not in acute distress.    Appearance: She is well-developed. She is not diaphoretic.  HENT:     Head: Normocephalic and atraumatic.     Mouth/Throat:     Pharynx: No oropharyngeal exudate.  Eyes:     Conjunctiva/sclera: Conjunctivae normal.     Pupils: Pupils are equal, round, and reactive to light.  Cardiovascular:     Rate and Rhythm: Normal rate and regular rhythm.     Heart sounds: Normal heart sounds.  Pulmonary:     Effort: Pulmonary effort is normal.     Breath sounds: Normal breath sounds.  Abdominal:     General: Bowel sounds are normal.     Palpations: Abdomen is soft.  Musculoskeletal:     Cervical back: Normal range of motion and neck supple.     Right lower leg: No edema.     Left lower leg: No edema.  Skin:    General: Skin is warm and dry.     Findings: Rash present.  Neurological:     Mental Status: She is alert.  Psychiatric:        Mood and Affect: Mood normal.     Labs reviewed: Basic Metabolic Panel: Recent Labs    12/21/23 0258 02/06/24 1544 03/29/24 1739 06/11/24 0742 06/12/24 0442 06/13/24 0500 07/01/24 1449  NA 138 141   < > 142 139 140 143  K 4.1 4.1   < > 4.3 4.6 4.9 4.9  CL 98 103   < > 104 104 102 105  CO2 31 26   < > 29 27 30 31   GLUCOSE 113*  97   < > 96 100* 94 110  BUN 19 20   < > 18 21 20 23   CREATININE 1.54* 1.43*   < > 1.46* 1.70* 1.61* 1.40*  CALCIUM  9.1 10.1   < > 9.3 8.8* 9.4 9.4  MG 1.6*  --   --  2.2  --  2.3  --   PHOS 2.8  --   --   --   --   --   --   TSH  --  1.71  --  25.100*  --   --  2.77   < > = values in this interval not displayed.   Liver Function Tests: Recent Labs    03/29/24 1739 04/23/24 1954 06/10/24 1748 07/01/24 1449  AST 22 20 19 15   ALT 22 8 16 14   ALKPHOS 71 104 95  --   BILITOT 1.4* 1.0 1.0 1.4*  PROT 6.4* 6.9 6.6 6.2  ALBUMIN  3.7 4.2 3.8  --    Recent Labs    03/29/24 1739  LIPASE 36   Recent Labs    12/18/23 1018 06/10/24 1747  AMMONIA 39* 23    CBC: Recent Labs    02/06/24 1544 03/29/24 1739 06/10/24 1748 06/11/24 0742 06/12/24 0442 07/01/24 1449  WBC 6.8   < > 8.3 6.3 5.3 4.9  NEUTROABS 4,570  --  6.6  --   --  2,793  HGB 13.7   < > 12.9 12.3 11.2* 12.8  HCT 43.5   < > 41.0 39.0 37.9 39.8  MCV 87.5   < > 88.6 90.5 91.1 90.7  PLT 170   < > 211 175 179 161   < > = values in this interval not displayed.   Lipid Panel: Recent Labs    12/14/23 0424  CHOL 73  HDL 22*  LDLCALC 29  TRIG 890  CHOLHDL 3.3   TSH: Recent Labs    02/06/24 1544 06/11/24 0742 07/01/24 1449  TSH 1.71 25.100* 2.77   A1C: Lab Results  Component Value Date   HGBA1C 5.3 12/18/2023     Assessment/Plan Assessment and Plan Assessment & Plan Candidiasis of skin Persistent candidiasis under the breast with itching and burning. Rash nearly healed. - Continue fluconazole  once weekly for three weeks. - Apply niacinamide ointment, ensure thorough drying. - Use antifungal powder preventatively post-rash control. - to use fabric strips under breast to wick moisture and change frequently.  Acquired hypothyroidism Synthroid  dosage error however she has missed several doses, - she is scheduled for  follow-up on February 9th for wellness visit and lab recheck at that visit.   Dementia with behavioral disturbance Medication adjusted by psychiatrist. Seroquel  replaced with Risperdal . Buspirone  and Xanax  discontinued, alprazolam  prescribed. - Continue Risperdal  as prescribed.  Lower extremity edema Edema due to prolonged sitting and poor circulation. - Elevate legs above heart level. - Use compression hose. - Follow low sodium diet.  Abrasion  No signs of infection on nose, healing well. Continue cleaning and dressing daily  Monitor for signs of infection.   General health maintenance Due for annual wellness visit. Referral to social worker needed and placed  - Scheduled annual wellness visit on February 9th. - Placed second referral  to child psychotherapist.  Maziyah Vessel K. Caro BODILY [facility] [phone]      [1]  Allergies Allergen Reactions   Lidocaine  Swelling and Other (See Comments)    Use CORRECT dose for age/weight- red welts and tongue swelling result,  if not   Cymbalta  [Duloxetine  Hcl] Other (See Comments)    Increased confusion and memory concerns    Donepezil Other (See Comments)    Altered mood and Aggression     Mobic [Meloxicam] Other (See Comments)    Heart failure- cannot take   Namenda [Memantine] Other (See Comments)    Severe aggression   Nsaids Other (See Comments)    Cannot have due to heart issues   Other Other (See Comments)    No salt or anything that might cause fluid retention/swelling!!   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

## 2024-08-16 NOTE — Progress Notes (Unsigned)
 Complex Care Management Note Care Guide Note  08/16/2024 Name: NISA DECAIRE MRN: [id number] DOB: 05/04/1941   Complex Care Management Outreach Attempts: An unsuccessful telephone outreach was attempted today to offer the patient information about available complex care management services.  Follow Up Plan:  Additional outreach attempts will be made to offer the patient complex care management information and services.   Encounter Outcome:  No Answer  Debbe Fuse [facility], [facility] Guide  Direct Dial: [phone]  Fax [phone]

## 2024-08-19 NOTE — Progress Notes (Unsigned)
 Complex Care Management Note Care Guide Note  08/19/2024 Name: Deanna [name] MRN: [id number] DOB: 1941/02/26   Complex Care Management Outreach Attempts: A second unsuccessful outreach was attempted today to offer the patient with information about available complex care management services.  Follow Up Plan:  Additional outreach attempts will be made to offer the patient complex care management information and services.   Encounter Outcome:  No Answer  Debbe Fuse [facility], [facility] Guide  Direct Dial: [phone]  Fax [phone]

## 2024-08-21 NOTE — Progress Notes (Signed)
 Complex Care Management Note  Care Guide Note 08/21/2024 Name: Deanna [name] MRN: [id number] DOB: [birth year]  Deanna [name] is a 84 y.o. year old female who sees Caro, [name] POUR, NP for primary care. I reached out to Raguel JONELLE Gay by phone today to offer complex care management services.  [name] was given information about Complex Care Management services today including:   The Complex Care Management services include support from the care team which includes your Nurse Care Manager, Clinical Social Worker, or Pharmacist.  The Complex Care Management team is here to help remove barriers to the health concerns and goals most important to you. Complex Care Management services are voluntary, and the patient may decline or stop services at any time by request to their care team member.   Complex Care Management Consent Status: Patient agreed to services and verbal consent obtained.   Follow up plan:  Telephone appointment with complex care management team member scheduled for:  09/20/24  Encounter Outcome:  Patient Scheduled  Debbe Fuse [facility], [facility] Guide  Direct Dial: [phone]  Fax [phone]

## 2024-08-27 ENCOUNTER — Other Ambulatory Visit: Payer: Self-pay | Admitting: Adult Health

## 2024-08-27 DIAGNOSIS — F39 Unspecified mood [affective] disorder: Secondary | ICD-10-CM

## 2024-09-16 ENCOUNTER — Ambulatory Visit: Admitting: Nurse Practitioner

## 2024-09-20 ENCOUNTER — Telehealth: Admitting: Licensed Clinical Social Worker

## 2024-10-04 ENCOUNTER — Ambulatory Visit: Admitting: Nurse Practitioner
